# Patient Record
Sex: Female | Born: 1955 | Race: White | Hispanic: No | Marital: Married | Smoking: Never smoker
Health system: Southern US, Community
[De-identification: ages and names within clinical notes are randomized; demographics above are authoritative.]

## PROBLEM LIST (undated history)

## (undated) DIAGNOSIS — R0600 Dyspnea, unspecified: Secondary | ICD-10-CM

## (undated) DIAGNOSIS — K7460 Unspecified cirrhosis of liver: Secondary | ICD-10-CM

## (undated) DIAGNOSIS — G8929 Other chronic pain: Secondary | ICD-10-CM

## (undated) DIAGNOSIS — E119 Type 2 diabetes mellitus without complications: Secondary | ICD-10-CM

## (undated) DIAGNOSIS — I2510 Atherosclerotic heart disease of native coronary artery without angina pectoris: Secondary | ICD-10-CM

## (undated) DIAGNOSIS — F32A Depression, unspecified: Secondary | ICD-10-CM

## (undated) DIAGNOSIS — G5710 Meralgia paresthetica, unspecified lower limb: Secondary | ICD-10-CM

## (undated) DIAGNOSIS — R748 Abnormal levels of other serum enzymes: Secondary | ICD-10-CM

## (undated) DIAGNOSIS — Z8719 Personal history of other diseases of the digestive system: Secondary | ICD-10-CM

## (undated) DIAGNOSIS — Z9889 Other specified postprocedural states: Secondary | ICD-10-CM

## (undated) DIAGNOSIS — H9319 Tinnitus, unspecified ear: Secondary | ICD-10-CM

## (undated) DIAGNOSIS — I10 Essential (primary) hypertension: Secondary | ICD-10-CM

## (undated) DIAGNOSIS — F419 Anxiety disorder, unspecified: Secondary | ICD-10-CM

## (undated) DIAGNOSIS — R0609 Other forms of dyspnea: Secondary | ICD-10-CM

## (undated) DIAGNOSIS — G629 Polyneuropathy, unspecified: Secondary | ICD-10-CM

## (undated) DIAGNOSIS — Z794 Long term (current) use of insulin: Secondary | ICD-10-CM

## (undated) DIAGNOSIS — M1990 Unspecified osteoarthritis, unspecified site: Secondary | ICD-10-CM

## (undated) DIAGNOSIS — G4733 Obstructive sleep apnea (adult) (pediatric): Secondary | ICD-10-CM

## (undated) DIAGNOSIS — I209 Angina pectoris, unspecified: Secondary | ICD-10-CM

## (undated) DIAGNOSIS — R112 Nausea with vomiting, unspecified: Secondary | ICD-10-CM

## (undated) DIAGNOSIS — G4730 Sleep apnea, unspecified: Secondary | ICD-10-CM

## (undated) DIAGNOSIS — R05 Cough: Secondary | ICD-10-CM

## (undated) DIAGNOSIS — I447 Left bundle-branch block, unspecified: Secondary | ICD-10-CM

## (undated) DIAGNOSIS — G2581 Restless legs syndrome: Secondary | ICD-10-CM

## (undated) DIAGNOSIS — K219 Gastro-esophageal reflux disease without esophagitis: Secondary | ICD-10-CM

## (undated) DIAGNOSIS — D649 Anemia, unspecified: Secondary | ICD-10-CM

## (undated) DIAGNOSIS — D500 Iron deficiency anemia secondary to blood loss (chronic): Secondary | ICD-10-CM

## (undated) DIAGNOSIS — M48061 Spinal stenosis, lumbar region without neurogenic claudication: Secondary | ICD-10-CM

## (undated) DIAGNOSIS — G4700 Insomnia, unspecified: Secondary | ICD-10-CM

## (undated) DIAGNOSIS — M545 Low back pain: Secondary | ICD-10-CM

## (undated) DIAGNOSIS — J984 Other disorders of lung: Secondary | ICD-10-CM

## (undated) DIAGNOSIS — E785 Hyperlipidemia, unspecified: Secondary | ICD-10-CM

## (undated) DIAGNOSIS — F329 Major depressive disorder, single episode, unspecified: Secondary | ICD-10-CM

## (undated) DIAGNOSIS — T8859XA Other complications of anesthesia, initial encounter: Secondary | ICD-10-CM

## (undated) DIAGNOSIS — M7071 Other bursitis of hip, right hip: Secondary | ICD-10-CM

## (undated) DIAGNOSIS — F3340 Major depressive disorder, recurrent, in remission, unspecified: Secondary | ICD-10-CM

## (undated) DIAGNOSIS — Z9289 Personal history of other medical treatment: Secondary | ICD-10-CM

## (undated) DIAGNOSIS — E7800 Pure hypercholesterolemia, unspecified: Secondary | ICD-10-CM

## (undated) DIAGNOSIS — Z87442 Personal history of urinary calculi: Secondary | ICD-10-CM

## (undated) DIAGNOSIS — M7631 Iliotibial band syndrome, right leg: Secondary | ICD-10-CM

## (undated) DIAGNOSIS — M7061 Trochanteric bursitis, right hip: Secondary | ICD-10-CM

## (undated) DIAGNOSIS — R011 Cardiac murmur, unspecified: Secondary | ICD-10-CM

## (undated) DIAGNOSIS — N183 Chronic kidney disease, stage 3 unspecified: Secondary | ICD-10-CM

## (undated) DIAGNOSIS — Z981 Arthrodesis status: Secondary | ICD-10-CM

## (undated) DIAGNOSIS — N189 Chronic kidney disease, unspecified: Secondary | ICD-10-CM

## (undated) DIAGNOSIS — M109 Gout, unspecified: Secondary | ICD-10-CM

## (undated) DIAGNOSIS — T4145XA Adverse effect of unspecified anesthetic, initial encounter: Secondary | ICD-10-CM

## (undated) HISTORY — DX: Other chronic pain: G89.29

## (undated) HISTORY — DX: Essential (primary) hypertension: I10

## (undated) HISTORY — PX: CARPAL TUNNEL RELEASE: SHX101

## (undated) HISTORY — DX: Other disorders of lung: J98.4

## (undated) HISTORY — DX: Angina pectoris, unspecified: I20.9

## (undated) HISTORY — DX: Tinnitus, unspecified ear: H93.19

## (undated) HISTORY — DX: Gout, unspecified: M10.9

## (undated) HISTORY — DX: Pure hypercholesterolemia, unspecified: E78.00

## (undated) HISTORY — DX: Left bundle-branch block, unspecified: I44.7

## (undated) HISTORY — DX: Obstructive sleep apnea (adult) (pediatric): G47.33

## (undated) HISTORY — PX: EYE SURGERY: SHX253

## (undated) HISTORY — DX: Other specified postprocedural states: Z98.890

## (undated) HISTORY — DX: Cough: R05

## (undated) HISTORY — DX: Meralgia paresthetica, unspecified lower limb: G57.10

## (undated) HISTORY — DX: Insomnia, unspecified: G47.00

## (undated) HISTORY — DX: Restless legs syndrome: G25.81

## (undated) HISTORY — DX: Atherosclerotic heart disease of native coronary artery without angina pectoris: I25.10

## (undated) HISTORY — PX: BACK SURGERY: SHX140

## (undated) HISTORY — DX: Major depressive disorder, recurrent, in remission, unspecified: F33.40

## (undated) HISTORY — DX: Hyperlipidemia, unspecified: E78.5

## (undated) HISTORY — DX: Trochanteric bursitis, right hip: M70.61

## (undated) HISTORY — PX: CARDIAC CATHETERIZATION: SHX172

## (undated) HISTORY — PX: CHOLECYSTECTOMY: SHX55

## (undated) HISTORY — DX: Iliotibial band syndrome, right leg: M76.31

## (undated) HISTORY — DX: Arthrodesis status: Z98.1

## (undated) HISTORY — DX: Low back pain: M54.5

## (undated) HISTORY — DX: Type 2 diabetes mellitus without complications: E11.9

## (undated) HISTORY — DX: Abnormal levels of other serum enzymes: R74.8

## (undated) HISTORY — DX: Iron deficiency anemia secondary to blood loss (chronic): D50.0

## (undated) HISTORY — PX: APPENDECTOMY: SHX54

## (undated) HISTORY — DX: Other forms of dyspnea: R06.09

## (undated) HISTORY — DX: Long term (current) use of insulin: Z79.4

---

## 1981-10-09 HISTORY — PX: ABDOMINAL HYSTERECTOMY: SHX81

## 1994-10-09 HISTORY — PX: OTHER SURGICAL HISTORY: SHX169

## 2004-09-23 ENCOUNTER — Ambulatory Visit: Payer: Self-pay | Admitting: Family Medicine

## 2004-09-30 ENCOUNTER — Ambulatory Visit: Payer: Self-pay | Admitting: Family Medicine

## 2004-11-22 ENCOUNTER — Ambulatory Visit: Payer: Self-pay | Admitting: Family Medicine

## 2005-04-25 ENCOUNTER — Ambulatory Visit: Payer: Self-pay | Admitting: Family Medicine

## 2005-05-03 ENCOUNTER — Ambulatory Visit: Payer: Self-pay | Admitting: Family Medicine

## 2005-10-18 ENCOUNTER — Ambulatory Visit: Payer: Self-pay | Admitting: Family Medicine

## 2005-11-20 ENCOUNTER — Ambulatory Visit: Payer: Self-pay | Admitting: Family Medicine

## 2006-01-01 ENCOUNTER — Ambulatory Visit: Payer: Self-pay | Admitting: Family Medicine

## 2006-10-09 HISTORY — PX: KIDNEY STONE SURGERY: SHX686

## 2008-10-09 HISTORY — PX: OTHER SURGICAL HISTORY: SHX169

## 2009-08-20 ENCOUNTER — Inpatient Hospital Stay (HOSPITAL_BASED_OUTPATIENT_CLINIC_OR_DEPARTMENT_OTHER): Admission: RE | Admit: 2009-08-20 | Discharge: 2009-08-20 | Payer: Self-pay | Admitting: Cardiology

## 2010-07-19 ENCOUNTER — Encounter: Admission: RE | Admit: 2010-07-19 | Discharge: 2010-07-19 | Payer: Self-pay | Admitting: Neurological Surgery

## 2010-08-31 ENCOUNTER — Inpatient Hospital Stay (HOSPITAL_COMMUNITY): Admission: RE | Admit: 2010-08-31 | Discharge: 2010-09-02 | Payer: Self-pay | Admitting: Neurological Surgery

## 2010-10-12 ENCOUNTER — Encounter
Admission: RE | Admit: 2010-10-12 | Discharge: 2010-10-12 | Payer: Self-pay | Source: Home / Self Care | Attending: Neurological Surgery | Admitting: Neurological Surgery

## 2010-10-25 ENCOUNTER — Encounter
Admission: RE | Admit: 2010-10-25 | Discharge: 2010-10-25 | Payer: Self-pay | Source: Home / Self Care | Attending: Neurological Surgery | Admitting: Neurological Surgery

## 2010-11-18 ENCOUNTER — Other Ambulatory Visit: Payer: Self-pay | Admitting: Neurological Surgery

## 2010-11-18 DIAGNOSIS — M545 Low back pain: Secondary | ICD-10-CM

## 2010-11-21 ENCOUNTER — Ambulatory Visit
Admission: RE | Admit: 2010-11-21 | Discharge: 2010-11-21 | Disposition: A | Discharge status: Home / Self Care | Payer: BC Managed Care – PPO | Source: Ambulatory Visit | Attending: Neurological Surgery | Admitting: Neurological Surgery

## 2010-11-21 DIAGNOSIS — M545 Low back pain: Secondary | ICD-10-CM

## 2010-12-20 LAB — COMPREHENSIVE METABOLIC PANEL
ALT: 46 U/L — ABNORMAL HIGH (ref 0–35)
Alkaline Phosphatase: 58 U/L (ref 39–117)
BUN: 15 mg/dL (ref 6–23)
CO2: 24 mEq/L (ref 19–32)
Calcium: 9.5 mg/dL (ref 8.4–10.5)
GFR calc non Af Amer: >60 mL/min (ref >60)
Glucose, Bld: 126 mg/dL — ABNORMAL HIGH (ref 70–99)
Potassium: 4.7 mEq/L (ref 3.5–5.1)
Sodium: 135 mEq/L (ref 135–145)

## 2010-12-20 LAB — URINALYSIS, ROUTINE W REFLEX MICROSCOPIC
Bilirubin Urine: NEGATIVE
Glucose, UA: NEGATIVE mg/dL
Hgb urine dipstick: NEGATIVE
Ketones, ur: NEGATIVE mg/dL
pH: 5 (ref 5.0–8.0)

## 2010-12-20 LAB — ABO/RH: ABO/RH(D): A POS

## 2010-12-20 LAB — DIFFERENTIAL
Basophils Relative: 1 % (ref 0–1)
Eosinophils Absolute: 0.2 10*3/uL (ref 0.0–0.7)
Neutro Abs: 5.2 10*3/uL (ref 1.7–7.7)
Neutrophils Relative %: 69 % (ref 43–77)

## 2010-12-20 LAB — CBC
HCT: 35.7 % — ABNORMAL LOW (ref 36.0–46.0)
Hemoglobin: 11.6 g/dL — ABNORMAL LOW (ref 12.0–15.0)
MCHC: 32.5 g/dL (ref 30.0–36.0)
MCV: 97.3 fL (ref 78.0–100.0)

## 2010-12-20 LAB — GLUCOSE, CAPILLARY
Glucose-Capillary: 132 mg/dL — ABNORMAL HIGH (ref 70–99)
Glucose-Capillary: 219 mg/dL — ABNORMAL HIGH (ref 70–99)
Glucose-Capillary: 229 mg/dL — ABNORMAL HIGH (ref 70–99)
Glucose-Capillary: 292 mg/dL — ABNORMAL HIGH (ref 70–99)

## 2010-12-20 LAB — PROTIME-INR
INR: 0.87 (ref 0.00–1.49)
Prothrombin Time: 12 seconds (ref 11.6–15.2)

## 2010-12-20 LAB — TYPE AND SCREEN

## 2011-01-09 ENCOUNTER — Other Ambulatory Visit: Payer: Self-pay | Admitting: Neurological Surgery

## 2011-01-09 ENCOUNTER — Ambulatory Visit
Admission: RE | Admit: 2011-01-09 | Discharge: 2011-01-09 | Disposition: A | Discharge status: Home / Self Care | Payer: BC Managed Care – PPO | Source: Ambulatory Visit | Attending: Neurological Surgery | Admitting: Neurological Surgery

## 2011-01-09 DIAGNOSIS — M545 Low back pain: Secondary | ICD-10-CM

## 2011-01-09 DIAGNOSIS — M5137 Other intervertebral disc degeneration, lumbosacral region: Secondary | ICD-10-CM

## 2011-01-11 LAB — POCT I-STAT GLUCOSE: Glucose, Bld: 138 mg/dL — ABNORMAL HIGH (ref 70–99)

## 2011-05-23 ENCOUNTER — Other Ambulatory Visit: Payer: Self-pay | Admitting: Neurological Surgery

## 2011-05-23 ENCOUNTER — Ambulatory Visit
Admission: RE | Admit: 2011-05-23 | Discharge: 2011-05-23 | Disposition: A | Discharge status: Home / Self Care | Payer: BC Managed Care – PPO | Source: Ambulatory Visit | Attending: Neurological Surgery | Admitting: Neurological Surgery

## 2011-05-23 DIAGNOSIS — M5137 Other intervertebral disc degeneration, lumbosacral region: Secondary | ICD-10-CM

## 2011-05-23 DIAGNOSIS — M545 Low back pain: Secondary | ICD-10-CM

## 2011-05-23 DIAGNOSIS — M5136 Other intervertebral disc degeneration, lumbar region: Secondary | ICD-10-CM

## 2011-05-29 ENCOUNTER — Ambulatory Visit
Admission: RE | Admit: 2011-05-29 | Discharge: 2011-05-29 | Disposition: A | Discharge status: Home / Self Care | Payer: BC Managed Care – PPO | Source: Ambulatory Visit | Attending: Neurological Surgery | Admitting: Neurological Surgery

## 2011-05-29 DIAGNOSIS — M545 Low back pain: Secondary | ICD-10-CM

## 2011-05-29 DIAGNOSIS — M5136 Other intervertebral disc degeneration, lumbar region: Secondary | ICD-10-CM

## 2011-05-29 MED ORDER — DIAZEPAM 2 MG PO TABS
10.0000 mg | ORAL_TABLET | Freq: Once | ORAL | Status: AC
  Administered 2011-05-29: 10 mg via ORAL

## 2011-05-29 MED ORDER — HYDROCODONE-ACETAMINOPHEN 5-325 MG PO TABS
2.0000 | ORAL_TABLET | Freq: Once | ORAL | Status: AC
  Administered 2011-05-29: 2 via ORAL

## 2011-05-29 MED ORDER — IOHEXOL 180 MG/ML  SOLN
18.0000 mL | Freq: Once | INTRAMUSCULAR | Status: AC | PRN
  Administered 2011-05-29: 18 mL via INTRATHECAL

## 2011-06-20 ENCOUNTER — Encounter (HOSPITAL_COMMUNITY)
Admission: RE | Admit: 2011-06-20 | Discharge: 2011-06-20 | Disposition: A | Discharge status: Home / Self Care | Payer: BC Managed Care – PPO | Source: Ambulatory Visit | Attending: Neurological Surgery | Admitting: Neurological Surgery

## 2011-06-20 LAB — DIFFERENTIAL
Lymphocytes Relative: 24 % (ref 12–46)
Lymphs Abs: 1.9 10*3/uL (ref 0.7–4.0)
Monocytes Relative: 6 % (ref 3–12)
Neutro Abs: 5.4 10*3/uL (ref 1.7–7.7)
Neutrophils Relative %: 66 % (ref 43–77)

## 2011-06-20 LAB — CBC
Hemoglobin: 11.9 g/dL — ABNORMAL LOW (ref 12.0–15.0)
MCH: 32.1 pg (ref 26.0–34.0)
MCV: 94.9 fL (ref 78.0–100.0)
RBC: 3.71 MIL/uL — ABNORMAL LOW (ref 3.87–5.11)
WBC: 8.2 10*3/uL (ref 4.0–10.5)

## 2011-06-20 LAB — TYPE AND SCREEN: ABO/RH(D): A POS

## 2011-06-20 LAB — BASIC METABOLIC PANEL
CO2: 27 mEq/L (ref 19–32)
Calcium: 9.9 mg/dL (ref 8.4–10.5)
GFR calc non Af Amer: 56 mL/min — ABNORMAL LOW (ref >60)
Glucose, Bld: 193 mg/dL — ABNORMAL HIGH (ref 70–99)
Potassium: 5.2 mEq/L — ABNORMAL HIGH (ref 3.5–5.1)
Sodium: 141 mEq/L (ref 135–145)

## 2011-06-20 LAB — APTT: aPTT: 29 seconds (ref 24–37)

## 2011-06-20 LAB — SURGICAL PCR SCREEN
MRSA, PCR: NEGATIVE
Staphylococcus aureus: POSITIVE — AB

## 2011-06-28 ENCOUNTER — Inpatient Hospital Stay (HOSPITAL_COMMUNITY)
Admission: RE | Admit: 2011-06-28 | Discharge: 2011-07-02 | DRG: 756 | Disposition: A | Discharge status: Home / Self Care | Payer: BC Managed Care – PPO | Source: Ambulatory Visit | Attending: Neurological Surgery | Admitting: Neurological Surgery

## 2011-06-28 ENCOUNTER — Inpatient Hospital Stay (HOSPITAL_COMMUNITY): Payer: BC Managed Care – PPO

## 2011-06-28 DIAGNOSIS — K219 Gastro-esophageal reflux disease without esophagitis: Secondary | ICD-10-CM | POA: Diagnosis present

## 2011-06-28 DIAGNOSIS — I10 Essential (primary) hypertension: Secondary | ICD-10-CM | POA: Diagnosis present

## 2011-06-28 DIAGNOSIS — E119 Type 2 diabetes mellitus without complications: Secondary | ICD-10-CM | POA: Diagnosis present

## 2011-06-28 DIAGNOSIS — IMO0002 Reserved for concepts with insufficient information to code with codable children: Principal | ICD-10-CM | POA: Diagnosis present

## 2011-06-28 LAB — GLUCOSE, CAPILLARY
Glucose-Capillary: 189 mg/dL — ABNORMAL HIGH (ref 70–99)
Glucose-Capillary: 281 mg/dL — ABNORMAL HIGH (ref 70–99)

## 2011-06-29 LAB — GLUCOSE, CAPILLARY
Glucose-Capillary: 100 mg/dL — ABNORMAL HIGH (ref 70–99)
Glucose-Capillary: 186 mg/dL — ABNORMAL HIGH (ref 70–99)
Glucose-Capillary: 225 mg/dL — ABNORMAL HIGH (ref 70–99)

## 2011-06-30 LAB — GLUCOSE, CAPILLARY
Glucose-Capillary: 195 mg/dL — ABNORMAL HIGH (ref 70–99)
Glucose-Capillary: 165 mg/dL — ABNORMAL HIGH (ref 70–99)

## 2011-07-01 LAB — GLUCOSE, CAPILLARY: Glucose-Capillary: 173 mg/dL — ABNORMAL HIGH (ref 70–99)

## 2011-07-02 LAB — GLUCOSE, CAPILLARY
Glucose-Capillary: 189 mg/dL — ABNORMAL HIGH (ref 70–99)
Glucose-Capillary: 154 mg/dL — ABNORMAL HIGH (ref 70–99)
Glucose-Capillary: 154 mg/dL — ABNORMAL HIGH (ref 70–99)

## 2011-07-03 NOTE — Op Note (Signed)
[name], [name]                   ACCOUNT NO.:  [id number]  MEDICAL RECORD NO.:  [id number]  LOCATION:  3002                         FACILITY:  [facility]  PHYSICIAN:  Tia Alert, MD     DATE OF BIRTH:  08/15/1956  DATE OF PROCEDURE:  06/28/2011 DATE OF DISCHARGE:                              OPERATIVE REPORT   PREOPERATIVE DIAGNOSIS:  Pseudoarthrosis L3-L4, L4-L5 with back and leg pain.  POSTOPERATIVE DIAGNOSIS:  Pseudoarthrosis L3-L4, L4-L5 with back and leg pain.  PROCEDURES: 1. Intertransverse arthrodesis bilaterally utilizing morselized     allograft. 2. Segmental fixation L3 through L5 utilizing the [facility] Pedicle     Screw System.  SURGEON:  Tia Alert, MD  ASSISTANT:  Donalee Citrin, MD  ANESTHESIA:  General endotracheal.  COMPLICATIONS:  None apparent.  INDICATIONS FOR PROCEDURE:  [name] is a 55 year old female who underwent a two-level XLIF a year ago.  This was a stand-alone procedure.  She developed symptomatic pseudoarthrosis over time.  I recommended a posterior fusion with instrumentation.  She understood the risks, benefits, and expected outcome and wished to proceed.  DESCRIPTION OF PROCEDURE:  The patient was taken to the operating room and after induction of adequate generalized endotracheal anesthesia, she was rolled into the prone position on the Wilson frame and all pressure points were padded.  Her lumbar region was prepped with DuraPrep and then draped in usual sterile fashion.  10 mL of the local anesthesia was injected and a dorsal midline incision was made and carried down to the lumbosacral fascia.  The fascia was opened and the paraspinous musculature was taken down in a subperiosteal fashion to expose L3 through L5.  Intraoperative fluoroscopy confirmed my levels and then I took the dissection out over the facets to expose the L3, L4, and L5 transverse processes.  I confirmed my levels once again.  I then localized the pedicle screw  entry zones utilizing surface landmarks in AP and lateral fluoroscopy.  We probed each pedicle with a pedicle probe, tapped each pedicle with a 5 x 5 tap and then placed 65 x 45-mm pedicle screws into the L3, L4, and L5 pedicles bilaterally.  We used AP and lateral fluoroscopy to perform the screw placement.  We then decorticated the transverse processes bilaterally and placed morselized allograft out over these in the form of EquivaBone and Actifuse putty. We then placed lordotic rods into the multiaxial screw heads of the pedicle screws and locked these into position with the locking caps and an antitorque device.  We then checked our construct with AP and lateral fluoroscopy, it looked quite good.  We placed a medium Hemovac drain through a separate stab incision, irrigated, and then closed the fascia with 0 Vicryl, closed the subcutaneous and subcuticular tissue with 2-0 and 3-0 Vicryl, and closed the skin with Benzoin and Steri-Strips.  The drapes were removed, sterile dressing was applied.  The patient was awakened from general anesthesia and transferred to recovery room in stable condition. At the end of the procedure, all sponge, needle, and instrument counts were correct.     Tia Alert, MD     DSJ/MEDQ  D:  06/28/2011  T:  06/28/2011  Job:  [id number]  Electronically Signed by Marikay Alar MD on 07/03/2011 01:13:56 PM

## 2011-07-24 ENCOUNTER — Ambulatory Visit
Admission: RE | Admit: 2011-07-24 | Discharge: 2011-07-24 | Disposition: A | Discharge status: Home / Self Care | Payer: BC Managed Care – PPO | Source: Ambulatory Visit | Attending: Neurological Surgery | Admitting: Neurological Surgery

## 2011-07-24 ENCOUNTER — Other Ambulatory Visit: Payer: Self-pay | Admitting: Neurological Surgery

## 2011-07-24 DIAGNOSIS — M545 Low back pain: Secondary | ICD-10-CM

## 2011-07-28 NOTE — Discharge Summary (Signed)
  NAMECHRISTINEA, BRIZUELA                   ACCOUNT NO.:  [id number]  MEDICAL RECORD NO.:  [id number]  LOCATION:  3002                         FACILITY:  [facility]  PHYSICIAN:  Tia Alert, MD     DATE OF BIRTH:  July 01, 1956  DATE OF ADMISSION:  06/28/2011 DATE OF DISCHARGE:  07/02/2011                              DISCHARGE SUMMARY   ADMITTING DIAGNOSIS:  Pseudoarthrosis.  PROCEDURE:  Posterior lumbar fusion with segmental instrumentation, L3- L5.  HISTORY OF PRESENT ILLNESS:  Ms. Ordoyne had a lumbar fusion about a year ago and had continued back and leg pain.  She had plain films in flexion- extension views and MRI and CT scan which all also suggested pseudoarthrosis at both levels.  I recommended two-level posterior lumbar fusion with instrumentation.  She understood the risks, benefits, expected outcome and wished to proceed.  HOSPITAL COURSE:  The patient was admitted on June 28, 2011 and taken to the operating room where she underwent two-level fusion from L3- L5.  The patient tolerated the procedure well and went to recovery room and then to the floor in stable condition.  For details of the operative procedure, please see the dictated operative note.  The patient's hospital course was routine.  There were no complications.  She had good strength after surgery.  Her incision remained clean, dry, and intact. She had a Hemovac drain in place for 2-3 days after the surgery.  She had good strength in her lower extremities.  Her leg pain was gone.  She had appropriate postop back soreness.  Her diet was advanced.  She remained afebrile with stable vital signs.  She tolerated activities with physical and occupational therapy and was discharged to home in stable condition on postop day 4 with plans to follow up in 2 weeks.  FINAL DIAGNOSIS:  L3-L5 posterior fusion with segmental instrumentation.     Tia Alert, MD     DSJ/MEDQ  D:  07/18/2011  T:  07/18/2011  Job:   [id number]  Electronically Signed by Marikay Alar MD on 07/28/2011 10:50:23 AM

## 2011-08-22 ENCOUNTER — Other Ambulatory Visit: Payer: Self-pay | Admitting: Neurological Surgery

## 2011-08-22 ENCOUNTER — Ambulatory Visit
Admission: RE | Admit: 2011-08-22 | Discharge: 2011-08-22 | Disposition: A | Discharge status: Home / Self Care | Payer: BC Managed Care – PPO | Source: Ambulatory Visit | Attending: Neurological Surgery | Admitting: Neurological Surgery

## 2011-08-22 DIAGNOSIS — M545 Low back pain: Secondary | ICD-10-CM

## 2011-10-31 ENCOUNTER — Other Ambulatory Visit: Payer: Self-pay | Admitting: Neurological Surgery

## 2011-10-31 ENCOUNTER — Ambulatory Visit
Admission: RE | Admit: 2011-10-31 | Discharge: 2011-10-31 | Disposition: A | Discharge status: Home / Self Care | Payer: BC Managed Care – PPO | Source: Ambulatory Visit | Attending: Neurological Surgery | Admitting: Neurological Surgery

## 2011-10-31 DIAGNOSIS — M545 Low back pain: Secondary | ICD-10-CM

## 2011-10-31 DIAGNOSIS — M961 Postlaminectomy syndrome, not elsewhere classified: Secondary | ICD-10-CM

## 2012-01-04 ENCOUNTER — Encounter: Payer: Self-pay | Admitting: *Deleted

## 2012-01-30 ENCOUNTER — Other Ambulatory Visit: Payer: Self-pay | Admitting: Surgical

## 2012-01-30 ENCOUNTER — Encounter (HOSPITAL_COMMUNITY): Payer: Self-pay | Admitting: Pharmacy Technician

## 2012-01-30 MED ORDER — BUPIVACAINE LIPOSOME 1.3 % IJ SUSP: 20.0000 mL | Freq: Once | INTRAMUSCULAR | Status: DC

## 2012-01-31 ENCOUNTER — Ambulatory Visit (HOSPITAL_COMMUNITY)
Admission: RE | Admit: 2012-01-31 | Discharge: 2012-01-31 | Disposition: A | Discharge status: Home / Self Care | Payer: BC Managed Care – PPO | Source: Ambulatory Visit | Attending: Orthopedic Surgery | Admitting: Orthopedic Surgery

## 2012-01-31 ENCOUNTER — Encounter (HOSPITAL_COMMUNITY): Payer: Self-pay

## 2012-01-31 ENCOUNTER — Encounter (HOSPITAL_COMMUNITY)
Admission: RE | Admit: 2012-01-31 | Discharge: 2012-01-31 | Disposition: A | Discharge status: Home / Self Care | Payer: BC Managed Care – PPO | Source: Ambulatory Visit | Attending: Orthopedic Surgery | Admitting: Orthopedic Surgery

## 2012-01-31 DIAGNOSIS — M76899 Other specified enthesopathies of unspecified lower limb, excluding foot: Secondary | ICD-10-CM | POA: Insufficient documentation

## 2012-01-31 DIAGNOSIS — Z01812 Encounter for preprocedural laboratory examination: Secondary | ICD-10-CM | POA: Insufficient documentation

## 2012-01-31 DIAGNOSIS — Z01818 Encounter for other preprocedural examination: Secondary | ICD-10-CM | POA: Insufficient documentation

## 2012-01-31 HISTORY — DX: Major depressive disorder, single episode, unspecified: F32.9

## 2012-01-31 HISTORY — DX: Personal history of other diseases of the digestive system: Z87.19

## 2012-01-31 HISTORY — DX: Other bursitis of hip, right hip: M70.71

## 2012-01-31 HISTORY — DX: Restless legs syndrome: G25.81

## 2012-01-31 HISTORY — DX: Sleep apnea, unspecified: G47.30

## 2012-01-31 HISTORY — DX: Depression, unspecified: F32.A

## 2012-01-31 HISTORY — DX: Other specified postprocedural states: Z98.890

## 2012-01-31 HISTORY — DX: Gastro-esophageal reflux disease without esophagitis: K21.9

## 2012-01-31 HISTORY — DX: Spinal stenosis, lumbar region without neurogenic claudication: M48.061

## 2012-01-31 HISTORY — DX: Nausea with vomiting, unspecified: R11.2

## 2012-01-31 LAB — URINALYSIS, ROUTINE W REFLEX MICROSCOPIC
Bilirubin Urine: NEGATIVE
Glucose, UA: 250 mg/dL — AB
Hgb urine dipstick: NEGATIVE
Ketones, ur: NEGATIVE mg/dL
Nitrite: NEGATIVE
Protein, ur: NEGATIVE mg/dL
Specific Gravity, Urine: 1.023 (ref 1.005–1.030)
Urobilinogen, UA: 0.2 mg/dL (ref 0.0–1.0)
pH: 6 (ref 5.0–8.0)

## 2012-01-31 LAB — COMPREHENSIVE METABOLIC PANEL
ALT: 56 U/L — ABNORMAL HIGH (ref 0–35)
AST: 52 U/L — ABNORMAL HIGH (ref 0–37)
Albumin: 3.9 g/dL (ref 3.5–5.2)
Alkaline Phosphatase: 75 U/L (ref 39–117)
BUN: 19 mg/dL (ref 6–23)
CO2: 29 mEq/L (ref 19–32)
Calcium: 9.7 mg/dL (ref 8.4–10.5)
Chloride: 102 mEq/L (ref 96–112)
Creatinine, Ser: 0.8 mg/dL (ref 0.50–1.10)
GFR calc Af Amer: >90 mL/min (ref >90)
GFR calc non Af Amer: 82 mL/min — ABNORMAL LOW (ref >90)
Glucose, Bld: 176 mg/dL — ABNORMAL HIGH (ref 70–99)
Potassium: 5.2 mEq/L — ABNORMAL HIGH (ref 3.5–5.1)
Sodium: 137 mEq/L (ref 135–145)
Total Bilirubin: 0.2 mg/dL — ABNORMAL LOW (ref 0.3–1.2)
Total Protein: 7.3 g/dL (ref 6.0–8.3)

## 2012-01-31 LAB — URINE MICROSCOPIC-ADD ON

## 2012-01-31 LAB — DIFFERENTIAL
Basophils Absolute: 0.1 10*3/uL (ref 0.0–0.1)
Basophils Relative: 1 % (ref 0–1)
Eosinophils Absolute: 0.3 10*3/uL (ref 0.0–0.7)
Eosinophils Relative: 4 % (ref 0–5)
Lymphocytes Relative: 24 % (ref 12–46)
Lymphs Abs: 1.8 10*3/uL (ref 0.7–4.0)
Monocytes Absolute: 0.5 10*3/uL (ref 0.1–1.0)
Monocytes Relative: 6 % (ref 3–12)
Neutro Abs: 4.9 10*3/uL (ref 1.7–7.7)
Neutrophils Relative %: 65 % (ref 43–77)

## 2012-01-31 LAB — CBC
HCT: 37.3 % (ref 36.0–46.0)
Hemoglobin: 12.4 g/dL (ref 12.0–15.0)
MCH: 31 pg (ref 26.0–34.0)
MCHC: 33.2 g/dL (ref 30.0–36.0)
MCV: 93.3 fL (ref 78.0–100.0)
Platelets: 326 10*3/uL (ref 150–400)
RBC: 4 MIL/uL (ref 3.87–5.11)
RDW: 12.3 % (ref 11.5–15.5)
WBC: 7.6 10*3/uL (ref 4.0–10.5)

## 2012-01-31 LAB — PROTIME-INR
INR: 0.85 (ref 0.00–1.49)
Prothrombin Time: 11.8 seconds (ref 11.6–15.2)

## 2012-01-31 LAB — APTT: aPTT: 32 seconds (ref 24–37)

## 2012-01-31 LAB — SURGICAL PCR SCREEN: MRSA, PCR: NEGATIVE

## 2012-01-31 NOTE — Pre-Procedure Instructions (Signed)
PT'S EKG REPORT ON PT'S CHART FROM [facility] - DONE 12/04/11.   PT'S CARDIAC MONITOR STUDY REPORT 12/07/11 TO 01/06/12 ON CHART FROM [facility]--PT STATES SHE WAS TOLD REPORT SHOWED TACHYCARDIA-NO OTHER ARRHTHYMIAS.   PT'S CARDIAC CATH REPORT [facility] 08/20/09 ON PT'S CHART.   SLEEP STUDY REPORT 12/28/10 ON CHART FROM [facility]. CBC WITH DIFF, CMET, PT, PTT, UA, T/S AND CXR WERE DONE TODAY AT [facility] PREOP.

## 2012-01-31 NOTE — Patient Instructions (Signed)
YOUR SURGERY IS SCHEDULED ON:  Friday  4/26  AT 2:30 PM  REPORT TO [facility] SHORT STAY CENTER AT:  12:30 PM      PHONE # FOR SHORT STAY IS [phone]  DO NOT EAT ANYTHING AFTER MIDNIGHT THE NIGHT BEFORE YOUR SURGERY.   NO FOOD, NO CHEWING GUM, NO MINTS, NO CANDIES, NO CHEWING TOBACCO.  YOU MANY HAVE CLEAR LIQUIDS TO DRINK FROM MIDNIGHT THE NIGHT BEFORE YOUR SURGERY UNTIL 8:30 AM--LIKE WATER OR SODA OR APPLE JUICE.  NOTHING TO DRINK AFTER 8:30 AM DAY OF SURGERY.  PLEASE TAKE THE FOLLOWING MEDICATIONS THE AM OF YOUR SURGERY WITH A FEW SIPS OF WATER:  CYMBALTA AND NEXIUM    IF YOU USE INHALERS--USE YOUR INHALERS THE AM OF YOUR SURGERY AND BRING INHALERS TO THE HOSPITAL -TAKE TO SURGERY.    IF YOU ARE DIABETIC:  DO NOT TAKE ANY DIABETIC MEDICATIONS THE AM OF YOUR SURGERY.  IF YOU TAKE INSULIN IN THE EVENINGS--PLEASE ONLY TAKE 1/2 NORMAL EVENING DOSE THE NIGHT BEFORE YOUR SURGERY.  NO INSULIN THE AM OF YOUR SURGERY.  IF YOU HAVE SLEEP APNEA AND USE CPAP OR BIPAP--PLEASE BRING THE MASK --NOT THE MACHINE-NOT THE TUBING   -JUST THE MASK. DO NOT BRING VALUABLES, MONEY, CREDIT CARDS.  CONTACT LENS, DENTURES / PARTIALS, GLASSES SHOULD NOT BE WORN TO SURGERY AND IN MOST CASES-HEARING AIDS WILL NEED TO BE REMOVED.  BRING YOUR GLASSES CASE, ANY EQUIPMENT NEEDED FOR YOUR CONTACT LENS. FOR PATIENTS ADMITTED TO THE HOSPITAL--CHECK OUT TIME THE DAY OF DISCHARGE IS 11:00 AM.  ALL INPATIENT ROOMS ARE PRIVATE - WITH BATHROOM, TELEPHONE, TELEVISION AND WIFI INTERNET. IF YOU ARE BEING DISCHARGED THE SAME DAY OF YOUR SURGERY--YOU CAN NOT DRIVE YOURSELF HOME--AND SHOULD NOT GO HOME ALONE BY TAXI OR BUS.  NO DRIVING OR OPERATING MACHINERY FOR 24 HOURS FOLLOWING ANESTHESIA / PAIN MEDICATIONS.                            SPECIAL INSTRUCTIONS:  CHLORHEXIDINE SOAP SHOWER (other brand names are Betasept and Hibiclens ) PLEASE SHOWER WITH CHLORHEXIDINE THE NIGHT BEFORE YOUR SURGERY AND THE AM OF YOUR SURGERY. DO NOT  USE CHLORHEXIDINE ON YOUR FACE OR PRIVATE AREAS--YOU MAY USE YOUR NORMAL SOAP THOSE AREAS AND YOUR NORMAL SHAMPOO.  WOMEN SHOULD AVOID SHAVING UNDER ARMS AND SHAVING LEGS 48 HOURS BEFORE USING CHLORHEXIDINE TO AVOID SKIN IRRITATION.  DO NOT USE IF ALLERGIC TO CHLORHEXIDINE.  PLEASE READ OVER ANY  FACT SHEETS THAT YOU WERE GIVEN: MRSA INFORMATION, BLOOD TRANSFUSION INFORMATION, INCENTIVE SPIROMETER INFORMATION.

## 2012-02-01 NOTE — H&P (Signed)
Katelyn [name] DOB: 08/30/1956   Chief Complaint: right lateral hip pain  History of Present Illness The patient is a 56 year old female who is scheduled for a right hip bursectomy and resection of a portion of the iliotibial band. The patient reports right lateral hip problems including pain symptoms that have been present for years. The symptoms began without any known injury. Symptoms reported include hip pain and pain with weightbearing.  She states she has had injections in the past which helped some. She states her PCP advised her not to have more because of weakening of the muscle. She denies groin pain and no injury. She states she has been suffering with it for a long time. She is really ready to get on with another form of treatment for the right hip. She cannot lie on that bursa. She has had two previous back operations. She has a history of a bundle branch block. The patient has done well with her last two back surgeries with no complications. Most predictable means for decreased pain in the right lateral hip is surgery. Risks and benefits of the surgery discussed.    Past Medical History Cardiac Arrhythmia Depression Diabetes Mellitus, Type II Fibromyalgia Gastroesophageal Reflux Disease High blood pressure Hypercholesterolemia Kidney Stone Other disease, cancer, significant illness Sleep Apnea  Family History Bleeding disorder. father Cancer. father and sister Cerebrovascular Accident. father Congestive Heart Failure. mother and grandfather mothers side Depression. sister Diabetes Mellitus. mother and sister Heart Disease. mother Heart disease in female family member before age 32 Hypertension. father and sister Rheumatoid Arthritis. grandmother mothers side and grandmother fathers side   Social History Alcohol use. current drinker; drinks wine; only occasionally per week Children. 1 Current work status. disabled Drug/Alcohol Rehab  (Currently). no Drug/Alcohol Rehab (Previously). no Exercise. Exercises daily; does running / walking Illicit drug use. no Living situation. live with spouse Number of flights of stairs before winded. 4-5 Pain Contract. yes Tobacco / smoke exposure. yes Tobacco use. never smoker   Medication History IT consultant ( In Vitro) Active. Cymbalta (60MG  Capsule DR Part, Oral) Active. Cymbalta (30MG  Capsule DR Part, Oral) Active. Hydrocodone-Acetaminophen (5-500MG  Tablet, Oral) Active. Janumet (50-1000MG  Tablet, Oral) Active. Lisinopril-Hydrochlorothiazide (20-12.5MG  Tablet, Oral) Active. NexIUM (40MG  Capsule DR, Oral) Active. Zetia (10MG  Tablet, Oral) Active. Aspirin EC (81MG  Tablet DR, Oral) Active. Calcium-Magnesium (1 Oral) Active.   Pregnancy / Birth History Pregnant. no   Past Surgical History Appendectomy Arthroscopy of Knee. left Arthroscopy of Shoulder. right Carpal Tunnel Repair. bilateral Gallbladder Surgery. laporoscopic Hysterectomy. partial (non-cancerous) Rotator Cuff Repair. right Spinal Decompression. lower back Spinal Fusion. lower back Spinal Surgery  Review of Systems General:Present- Night Sweats, Fatigue and Weight Loss. Not Present- Chills, Fever, Appetite Loss, Feeling sick and Weight Gain. HEENT:Present- Ringing in the Ears. Not Present- Sensitivity to light, Hearing problems and Nose Bleed. Respiratory:Present- Dyspnea. Not Present- Snoring, Chronic Cough and Bloody sputum. Cardiovascular:Present- Shortness of Breath, Swelling of Extremities, Leg Cramps and Palpitations. Not Present- Chest Pain. Gastrointestinal:Present- Heartburn. Not Present- Bloody Stool, Abdominal Pain, Vomiting, Nausea and Incontinence of Stool. Female Genitourinary:Present- Frequency. Not Present- Blood in Urine, Menstrual Irregularities, Incontinence and Nocturia. Musculoskeletal:Present- Muscle Weakness, Muscle Pain, Joint Stiffness, Joint  Swelling, Joint Pain and Back Pain. Neurological:Present- Numbness, Burning and Headaches. Not Present- Tingling, Tremor and Dizziness. Psychiatric:Present- Anxiety, Depression and Memory Loss. Endocrine:Present- Cold Intolerance and Heat Intolerance. Not Present- Excessive hunger and Excessive Thirst. Hematology:Present- Easy Bruising. Not Present- Abnormal Bleeding, Anemia and Blood Clots.   Vitals Weight:  172 lb Height: 63 in Body Surface Area: 1.86 m Body Mass Index: 30.47 kg/m Pulse: 78 (Regular) BP: 144/66 (Sitting, Left Arm, Standard)    Physical Exam On examination, she is very tender over the greater trochanteric bursa. There are no masses and no tumors noted. No erythema. No signs of infection. The patient is really uncomfortable. Normal painless ROM in the hips and knees. No motor deficits in the LE. Heart sounds normal. RRR. Lungs clear to auscultation. Abdomen soft and nontender. Bowel sounds active. Neck supple. Oral cavity clear with no lesions present.    RADIOGRAPHS: X-rays of the hip and they are normal.   Assessment & Plan Enthesopathy, hip (726.5) Right hip bursectomy with resection of portion of iliotibial band We discussed the surgery and possible complications such as infection or blood clots in the leg which are rare. She would be in the hospital overnight. She would be under general anesthesia. We will give her IV antibiotics right before the surgery.         Dimitri Ped, PA-C

## 2012-02-02 ENCOUNTER — Encounter (HOSPITAL_COMMUNITY): Payer: Self-pay | Admitting: *Deleted

## 2012-02-02 ENCOUNTER — Encounter (HOSPITAL_COMMUNITY): Payer: Self-pay | Admitting: Anesthesiology

## 2012-02-02 ENCOUNTER — Ambulatory Visit (HOSPITAL_COMMUNITY)
Admission: RE | Admit: 2012-02-02 | Discharge: 2012-02-03 | Disposition: A | Discharge status: Home / Self Care | Payer: BC Managed Care – PPO | Source: Ambulatory Visit | Attending: Orthopedic Surgery | Admitting: Orthopedic Surgery

## 2012-02-02 ENCOUNTER — Encounter (HOSPITAL_COMMUNITY)
Admission: RE | Disposition: A | Discharge status: Home / Self Care | Payer: Self-pay | Source: Ambulatory Visit | Attending: Orthopedic Surgery

## 2012-02-02 ENCOUNTER — Ambulatory Visit (HOSPITAL_COMMUNITY): Payer: BC Managed Care – PPO | Admitting: Anesthesiology

## 2012-02-02 DIAGNOSIS — I10 Essential (primary) hypertension: Secondary | ICD-10-CM | POA: Insufficient documentation

## 2012-02-02 DIAGNOSIS — M7631 Iliotibial band syndrome, right leg: Secondary | ICD-10-CM | POA: Diagnosis present

## 2012-02-02 DIAGNOSIS — M7061 Trochanteric bursitis, right hip: Secondary | ICD-10-CM

## 2012-02-02 DIAGNOSIS — K219 Gastro-esophageal reflux disease without esophagitis: Secondary | ICD-10-CM | POA: Insufficient documentation

## 2012-02-02 DIAGNOSIS — M76899 Other specified enthesopathies of unspecified lower limb, excluding foot: Secondary | ICD-10-CM | POA: Insufficient documentation

## 2012-02-02 DIAGNOSIS — E7800 Pure hypercholesterolemia, unspecified: Secondary | ICD-10-CM | POA: Insufficient documentation

## 2012-02-02 DIAGNOSIS — M7630 Iliotibial band syndrome, unspecified leg: Secondary | ICD-10-CM | POA: Insufficient documentation

## 2012-02-02 DIAGNOSIS — M7060 Trochanteric bursitis, unspecified hip: Secondary | ICD-10-CM | POA: Insufficient documentation

## 2012-02-02 DIAGNOSIS — Z79899 Other long term (current) drug therapy: Secondary | ICD-10-CM | POA: Insufficient documentation

## 2012-02-02 DIAGNOSIS — M2420 Disorder of ligament, unspecified site: Secondary | ICD-10-CM | POA: Insufficient documentation

## 2012-02-02 DIAGNOSIS — E119 Type 2 diabetes mellitus without complications: Secondary | ICD-10-CM | POA: Insufficient documentation

## 2012-02-02 DIAGNOSIS — M629 Disorder of muscle, unspecified: Secondary | ICD-10-CM | POA: Insufficient documentation

## 2012-02-02 DIAGNOSIS — G4730 Sleep apnea, unspecified: Secondary | ICD-10-CM | POA: Insufficient documentation

## 2012-02-02 HISTORY — DX: Iliotibial band syndrome, right leg: M76.31

## 2012-02-02 HISTORY — DX: Trochanteric bursitis, right hip: M70.61

## 2012-02-02 HISTORY — PX: EXCISION/RELEASE BURSA HIP: SHX5014

## 2012-02-02 LAB — TYPE AND SCREEN
ABO/RH(D): A POS
Antibody Screen: NEGATIVE

## 2012-02-02 LAB — GLUCOSE, CAPILLARY
Glucose-Capillary: 160 mg/dL — ABNORMAL HIGH (ref 70–99)
Glucose-Capillary: 167 mg/dL — ABNORMAL HIGH (ref 70–99)
Glucose-Capillary: 223 mg/dL — ABNORMAL HIGH (ref 70–99)

## 2012-02-02 SURGERY — RELEASE, BURSA, TROCHANTERIC
Anesthesia: General | Site: Hip | Laterality: Right | Wound class: Clean

## 2012-02-02 MED ORDER — MENTHOL 3 MG MT LOZG
1.0000 | LOZENGE | OROMUCOSAL | Status: DC | PRN
  Filled 2012-02-02: qty 9

## 2012-02-02 MED ORDER — ACETAMINOPHEN 325 MG PO TABS: 650.0000 mg | ORAL_TABLET | Freq: Four times a day (QID) | ORAL | Status: DC | PRN

## 2012-02-02 MED ORDER — LISINOPRIL-HYDROCHLOROTHIAZIDE 20-12.5 MG PO TABS: 1.0000 | ORAL_TABLET | Freq: Every day | ORAL | Status: DC

## 2012-02-02 MED ORDER — THROMBIN 20000 UNITS EX KIT
PACK | CUTANEOUS | Status: DC | PRN
  Administered 2012-02-02: 15:00:00 via TOPICAL

## 2012-02-02 MED ORDER — SITAGLIPTIN PHOS-METFORMIN HCL 50-1000 MG PO TABS: 1.0000 | ORAL_TABLET | Freq: Two times a day (BID) | ORAL | Status: DC

## 2012-02-02 MED ORDER — ACETAMINOPHEN 650 MG RE SUPP: 650.0000 mg | Freq: Four times a day (QID) | RECTAL | Status: DC | PRN

## 2012-02-02 MED ORDER — PANTOPRAZOLE SODIUM 40 MG PO TBEC
40.0000 mg | DELAYED_RELEASE_TABLET | Freq: Every day | ORAL | Status: DC
  Administered 2012-02-02 – 2012-02-03 (×2): 40 mg via ORAL
  Filled 2012-02-02 (×2): qty 1

## 2012-02-02 MED ORDER — LIDOCAINE HCL (CARDIAC) 20 MG/ML IV SOLN
INTRAVENOUS | Status: DC | PRN
  Administered 2012-02-02: 80 mg via INTRAVENOUS

## 2012-02-02 MED ORDER — BUPIVACAINE LIPOSOME 1.3 % IJ SUSP
20.0000 mL | Freq: Once | INTRAMUSCULAR | Status: AC
  Administered 2012-02-02: 20 mL
  Filled 2012-02-02: qty 20

## 2012-02-02 MED ORDER — SUCCINYLCHOLINE CHLORIDE 20 MG/ML IJ SOLN
INTRAMUSCULAR | Status: DC | PRN
  Administered 2012-02-02: 100 mg via INTRAVENOUS

## 2012-02-02 MED ORDER — LACTATED RINGERS IV SOLN
INTRAVENOUS | Status: DC
  Administered 2012-02-02: 1000 mL via INTRAVENOUS

## 2012-02-02 MED ORDER — ALUM & MAG HYDROXIDE-SIMETH 200-200-20 MG/5ML PO SUSP: 30.0000 mL | ORAL | Status: DC | PRN

## 2012-02-02 MED ORDER — DULOXETINE HCL 60 MG PO CPEP
60.0000 mg | ORAL_CAPSULE | Freq: Every day | ORAL | Status: DC
  Administered 2012-02-03: 60 mg via ORAL
  Filled 2012-02-02 (×2): qty 1

## 2012-02-02 MED ORDER — FENTANYL CITRATE 0.05 MG/ML IJ SOLN
INTRAMUSCULAR | Status: DC | PRN
  Administered 2012-02-02 (×3): 50 ug via INTRAVENOUS

## 2012-02-02 MED ORDER — CEFAZOLIN SODIUM 1-5 GM-% IV SOLN
1.0000 g | INTRAVENOUS | Status: AC
  Administered 2012-02-02: 1 g via INTRAVENOUS

## 2012-02-02 MED ORDER — ONDANSETRON HCL 4 MG PO TABS: 4.0000 mg | ORAL_TABLET | Freq: Four times a day (QID) | ORAL | Status: DC | PRN

## 2012-02-02 MED ORDER — HYDROMORPHONE HCL PF 1 MG/ML IJ SOLN
0.2500 mg | INTRAMUSCULAR | Status: DC | PRN
  Administered 2012-02-02 (×2): 0.5 mg via INTRAVENOUS

## 2012-02-02 MED ORDER — LINAGLIPTIN 5 MG PO TABS
5.0000 mg | ORAL_TABLET | Freq: Every day | ORAL | Status: DC
  Administered 2012-02-02 – 2012-02-03 (×2): 5 mg via ORAL
  Filled 2012-02-02 (×2): qty 1

## 2012-02-02 MED ORDER — CEFAZOLIN SODIUM 1-5 GM-% IV SOLN
INTRAVENOUS | Status: AC
  Filled 2012-02-02: qty 50

## 2012-02-02 MED ORDER — THROMBIN 5000 UNITS EX SOLR
CUTANEOUS | Status: AC
  Filled 2012-02-02: qty 10000

## 2012-02-02 MED ORDER — ASPIRIN 81 MG PO CHEW
81.0000 mg | CHEWABLE_TABLET | Freq: Every day | ORAL | Status: DC
  Administered 2012-02-02: 81 mg via ORAL
  Filled 2012-02-02 (×2): qty 1

## 2012-02-02 MED ORDER — LISINOPRIL 20 MG PO TABS
20.0000 mg | ORAL_TABLET | Freq: Every day | ORAL | Status: DC
  Administered 2012-02-03: 20 mg via ORAL
  Filled 2012-02-02: qty 1

## 2012-02-02 MED ORDER — MIDAZOLAM HCL 5 MG/5ML IJ SOLN
INTRAMUSCULAR | Status: DC | PRN
  Administered 2012-02-02: 2 mg via INTRAVENOUS

## 2012-02-02 MED ORDER — HYDROMORPHONE HCL PF 1 MG/ML IJ SOLN
INTRAMUSCULAR | Status: AC
  Filled 2012-02-02: qty 1

## 2012-02-02 MED ORDER — SCOPOLAMINE 1 MG/3DAYS TD PT72
MEDICATED_PATCH | TRANSDERMAL | Status: AC
  Filled 2012-02-02: qty 1

## 2012-02-02 MED ORDER — PRAMIPEXOLE DIHYDROCHLORIDE 0.25 MG PO TABS
0.2500 mg | ORAL_TABLET | Freq: Every evening | ORAL | Status: DC
  Administered 2012-02-02: 0.25 mg via ORAL
  Filled 2012-02-02 (×2): qty 1

## 2012-02-02 MED ORDER — HYDROCHLOROTHIAZIDE 12.5 MG PO CAPS
12.5000 mg | ORAL_CAPSULE | Freq: Every day | ORAL | Status: DC
  Administered 2012-02-03: 12.5 mg via ORAL
  Filled 2012-02-02: qty 1

## 2012-02-02 MED ORDER — NEOSTIGMINE METHYLSULFATE 1 MG/ML IJ SOLN
INTRAMUSCULAR | Status: DC | PRN
  Administered 2012-02-02: 4 mg via INTRAVENOUS

## 2012-02-02 MED ORDER — ROCURONIUM BROMIDE 100 MG/10ML IV SOLN
INTRAVENOUS | Status: DC | PRN
  Administered 2012-02-02: 20 mg via INTRAVENOUS

## 2012-02-02 MED ORDER — GLYCOPYRROLATE 0.2 MG/ML IJ SOLN
INTRAMUSCULAR | Status: DC | PRN
  Administered 2012-02-02: .6 mg via INTRAVENOUS

## 2012-02-02 MED ORDER — METHOCARBAMOL 500 MG PO TABS
500.0000 mg | ORAL_TABLET | Freq: Four times a day (QID) | ORAL | Status: DC | PRN
  Administered 2012-02-03: 500 mg via ORAL
  Filled 2012-02-02: qty 1

## 2012-02-02 MED ORDER — ONDANSETRON HCL 4 MG/2ML IJ SOLN: 4.0000 mg | Freq: Four times a day (QID) | INTRAMUSCULAR | Status: DC | PRN

## 2012-02-02 MED ORDER — OXYCODONE-ACETAMINOPHEN 5-325 MG PO TABS
1.0000 | ORAL_TABLET | ORAL | Status: DC | PRN
  Administered 2012-02-02 – 2012-02-03 (×3): 2 via ORAL
  Filled 2012-02-02 (×3): qty 2

## 2012-02-02 MED ORDER — PHENOL 1.4 % MT LIQD: 1.0000 | OROMUCOSAL | Status: DC | PRN

## 2012-02-02 MED ORDER — SODIUM CHLORIDE 0.9 % IR SOLN
Status: DC | PRN
  Administered 2012-02-02: 15:00:00

## 2012-02-02 MED ORDER — METFORMIN HCL 500 MG PO TABS
1000.0000 mg | ORAL_TABLET | Freq: Two times a day (BID) | ORAL | Status: DC
  Administered 2012-02-02 – 2012-02-03 (×2): 1000 mg via ORAL
  Filled 2012-02-02 (×4): qty 2

## 2012-02-02 MED ORDER — LACTATED RINGERS IV SOLN
INTRAVENOUS | Status: DC
  Administered 2012-02-03: via INTRAVENOUS

## 2012-02-02 MED ORDER — CEFAZOLIN SODIUM 1-5 GM-% IV SOLN
1.0000 g | Freq: Four times a day (QID) | INTRAVENOUS | Status: AC
  Administered 2012-02-02 – 2012-02-03 (×3): 1 g via INTRAVENOUS
  Filled 2012-02-02 (×3): qty 50

## 2012-02-02 MED ORDER — HYDROCODONE-ACETAMINOPHEN 5-325 MG PO TABS: 1.0000 | ORAL_TABLET | ORAL | Status: DC | PRN

## 2012-02-02 MED ORDER — HEPARIN SODIUM (PORCINE) 5000 UNIT/ML IJ SOLN
5000.0000 [IU] | Freq: Two times a day (BID) | INTRAMUSCULAR | Status: DC
  Administered 2012-02-02 – 2012-02-03 (×2): 5000 [IU] via SUBCUTANEOUS
  Filled 2012-02-02 (×4): qty 1

## 2012-02-02 MED ORDER — METHOCARBAMOL 100 MG/ML IJ SOLN
500.0000 mg | Freq: Four times a day (QID) | INTRAVENOUS | Status: DC | PRN
  Filled 2012-02-02: qty 5

## 2012-02-02 MED ORDER — HYDROMORPHONE HCL PF 1 MG/ML IJ SOLN
0.5000 mg | INTRAMUSCULAR | Status: DC | PRN
  Administered 2012-02-03: 1 mg via INTRAVENOUS
  Filled 2012-02-02: qty 1

## 2012-02-02 MED ORDER — INSULIN ASPART 100 UNIT/ML ~~LOC~~ SOLN
0.0000 [IU] | Freq: Three times a day (TID) | SUBCUTANEOUS | Status: DC
  Administered 2012-02-03: 3 [IU] via SUBCUTANEOUS

## 2012-02-02 SURGICAL SUPPLY — 38 items
BAG ZIPLOCK 12X15 (MISCELLANEOUS) ×2 IMPLANT
CLEANER TIP ELECTROSURG 2X2 (MISCELLANEOUS) ×2 IMPLANT
CLOTH BEACON ORANGE TIMEOUT ST (SAFETY) ×2 IMPLANT
CLSR STERI-STRIP ANTIMIC 1/2X4 (GAUZE/BANDAGES/DRESSINGS) ×2 IMPLANT
DRAPE ORTHO SPLIT 77X108 STRL (DRAPES)
DRAPE SURG ORHT 6 SPLT 77X108 (DRAPES) IMPLANT
DRAPE U-SHAPE 47X51 STRL (DRAPES) IMPLANT
DRSG ADAPTIC 3X8 NADH LF (GAUZE/BANDAGES/DRESSINGS) ×2 IMPLANT
DRSG EMULSION OIL 3X16 NADH (GAUZE/BANDAGES/DRESSINGS) ×2 IMPLANT
DRSG PAD ABDOMINAL 8X10 ST (GAUZE/BANDAGES/DRESSINGS) ×2 IMPLANT
DURAPREP 26ML APPLICATOR (WOUND CARE) ×2 IMPLANT
ELECT REM PT RETURN 9FT ADLT (ELECTROSURGICAL) ×2
ELECTRODE REM PT RTRN 9FT ADLT (ELECTROSURGICAL) ×1 IMPLANT
GLOVE BIOGEL PI IND STRL 8 (GLOVE) ×1 IMPLANT
GLOVE BIOGEL PI IND STRL 8.5 (GLOVE) ×1 IMPLANT
GLOVE BIOGEL PI INDICATOR 8 (GLOVE) ×1
GLOVE BIOGEL PI INDICATOR 8.5 (GLOVE) ×1
GLOVE ECLIPSE 8.0 STRL XLNG CF (GLOVE) ×4 IMPLANT
GOWN PREVENTION PLUS LG XLONG (DISPOSABLE) ×4 IMPLANT
GOWN STRL REIN XL XLG (GOWN DISPOSABLE) ×4 IMPLANT
KIT BASIN OR (CUSTOM PROCEDURE TRAY) ×2 IMPLANT
MANIFOLD NEPTUNE II (INSTRUMENTS) IMPLANT
NS IRRIG 1000ML POUR BTL (IV SOLUTION) ×2 IMPLANT
PACK GENERAL/GYN (CUSTOM PROCEDURE TRAY) ×2 IMPLANT
PACK TOTAL JOINT (CUSTOM PROCEDURE TRAY) IMPLANT
POSITIONER SURGICAL ARM (MISCELLANEOUS) ×2 IMPLANT
SPONGE GAUZE 4X4 12PLY (GAUZE/BANDAGES/DRESSINGS) ×2 IMPLANT
SPONGE SURGIFOAM ABS GEL 100 (HEMOSTASIS) ×2 IMPLANT
STAPLER VISISTAT (STAPLE) IMPLANT
SUT MNCRL AB 4-0 PS2 18 (SUTURE) ×2 IMPLANT
SUT VIC AB 0 CT1 36 (SUTURE) IMPLANT
SUT VIC AB 1 CT1 27 (SUTURE) ×2
SUT VIC AB 1 CT1 27XBRD ANTBC (SUTURE) ×2 IMPLANT
SUT VIC AB 2-0 CT1 27 (SUTURE) ×1
SUT VIC AB 2-0 CT1 TAPERPNT 27 (SUTURE) ×1 IMPLANT
TAPE CLOTH SURG 4X10 WHT LF (GAUZE/BANDAGES/DRESSINGS) ×2 IMPLANT
TOWEL OR 17X26 10 PK STRL BLUE (TOWEL DISPOSABLE) ×4 IMPLANT
WATER STERILE IRR 1500ML POUR (IV SOLUTION) IMPLANT

## 2012-02-02 NOTE — Anesthesia Preprocedure Evaluation (Addendum)
Anesthesia Evaluation  Patient identified by MRN, date of birth, ID band Patient awake    Reviewed: Allergy & Precautions, H&P , NPO status , Patient's Chart, lab work & pertinent test results, reviewed documented beta blocker date and time   History of Anesthesia Complications (+) PONV and AWARENESS UNDER ANESTHESIA  Airway Mallampati: II TM Distance: >3 FB Neck ROM: Full    Dental  (+) Dental Advisory Given   Pulmonary sleep apnea ,  Mild OSA, no Rx breath sounds clear to auscultation        Cardiovascular hypertension, Pt. on medications Rhythm:Regular Rate:Normal  Heart cath 2009, no blockages   Neuro/Psych negative neurological ROS  negative psych ROS   GI/Hepatic negative GI ROS, Neg liver ROS,   Endo/Other  Diabetes mellitus-, Type 2, Oral Hypoglycemic AgentsFair control  Renal/GU negative Renal ROS  negative genitourinary   Musculoskeletal   Abdominal   Peds negative pediatric ROS (+)  Hematology negative hematology ROS (+)   Anesthesia Other Findings   Reproductive/Obstetrics negative OB ROS                          Anesthesia Physical Anesthesia Plan  ASA: III  Anesthesia Plan: General   Post-op Pain Management:    Induction: Intravenous  Airway Management Planned: LMA  Additional Equipment:   Intra-op Plan:   Post-operative Plan: Extubation in OR  Informed Consent:   Dental advisory given  Plan Discussed with: CRNA and Surgeon  Anesthesia Plan Comments:         Anesthesia Quick Evaluation

## 2012-02-02 NOTE — Preoperative (Signed)
Beta Blockers   Reason not to administer Beta Blockers:Not Applicable 

## 2012-02-02 NOTE — Transfer of Care (Signed)
Immediate Anesthesia Transfer of Care Note  Patient: Katelyn [name]  Procedure(s) Performed: Procedure(s) (LRB): EXCISION/RELEASE BURSA HIP (Right)  Patient Location: PACU  Anesthesia Type: General  Level of Consciousness: awake, alert , oriented and patient cooperative  Airway & Oxygen Therapy: Patient Spontanous Breathing and Patient connected to face mask oxygen  Post-op Assessment: Report given to PACU RN, Post -op Vital signs reviewed and stable and Patient moving all extremities  Post vital signs: Reviewed  Complications: No apparent anesthesia complications

## 2012-02-02 NOTE — Anesthesia Postprocedure Evaluation (Signed)
  Anesthesia Post-op Note  Patient: Katelyn [name]  Procedure(s) Performed: Procedure(s) (LRB): EXCISION/RELEASE BURSA HIP (Right)  Patient Location: PACU  Anesthesia Type: General  Level of Consciousness: oriented and sedated  Airway and Oxygen Therapy: Patient Spontanous Breathing and Patient connected to nasal cannula oxygen  Post-op Pain: mild  Post-op Assessment: Post-op Vital signs reviewed, Patient's Cardiovascular Status Stable, Respiratory Function Stable and Patent Airway  Post-op Vital Signs: stable  Complications: No apparent anesthesia complications

## 2012-02-02 NOTE — Interval H&P Note (Signed)
History and Physical Interval Note:  02/02/2012 2:47 PM  Katelyn [name]  has presented today for surgery, with the diagnosis of Right Hip Bursitis  The various methods of treatment have been discussed with the patient and family. After consideration of risks, benefits and other options for treatment, the patient has consented to  Procedure(s) (LRB): EXCISION/RELEASE BURSA HIP (Right) as a surgical intervention .  The patients' history has been reviewed, patient examined, no change in status, stable for surgery.  I have reviewed the patients' chart and labs.  Questions were answered to the patient's satisfaction.     [name] A

## 2012-02-02 NOTE — Brief Op Note (Signed)
02/02/2012  3:49 PM  PATIENT:  Katelyn [name]  55 y.o. female  PRE-OPERATIVE DIAGNOSIS:  Right Hip Bursitis  POST-OPERATIVE DIAGNOSIS:  right hip bursitis  PROCEDURE:  Procedure(s) (LRB): EXCISION/RELEASE BURSA HIP (Right)  SURGEON:  Surgeon(s) and Role:    * Jacki Cones, MD - Primary  PHYSICIAN ASSISTANT: Dimitri Ped PA    ANESTHESIA:   general  EBL:  Total I/O In: -  Out: 25 [Blood:25]  BLOOD ADMINISTERED:none  DRAINS: none   LOCAL MEDICATIONS USED:  BUPIVICAINE 20cc mixed with 20cc Normal Saline  SPECIMEN:  No Specimen  DISPOSITION OF SPECIMEN:  N/A  COUNTS:  YES  TOURNIQUET:  * No tourniquets in log *  DICTATION: .Other Dictation: Dictation Number [id number]  PLAN OF CARE: Admit for overnight observation  PATIENT DISPOSITION:  Leaving OR for PACU and is stable   Delay start of Pharmacological VTE agent (>24hrs) due to surgical blood loss or risk of bleeding: yes

## 2012-02-03 LAB — CBC
MCV: 94.1 fL (ref 78.0–100.0)
Platelets: 276 10*3/uL (ref 150–400)
RBC: 3.37 MIL/uL — ABNORMAL LOW (ref 3.87–5.11)
RDW: 12.6 % (ref 11.5–15.5)
WBC: 10.1 10*3/uL (ref 4.0–10.5)

## 2012-02-03 NOTE — Progress Notes (Signed)
Discharged from floor via w/c, spouse with pt. No changes in assessment. Lindsey Demonte   

## 2012-02-03 NOTE — Progress Notes (Signed)
Cm spoke with pt concerning dc planning. No MD or PT reccommendation for  [facility] services. Pt agrees with plan of care. Pt states ambulating well on own. Pt states having home DME for use. Pt's spouse to provide tx home and assist with home care.   Leonie Green [phone]

## 2012-02-03 NOTE — Op Note (Signed)
NAMEFARDOWSA, AUTHIER                   ACCOUNT NO.:  [id number]  MEDICAL RECORD NO.:  [id number]  LOCATION:  1621                         FACILITY:  [facility]  PHYSICIAN:  Georges Lynch. Isadore Palecek, M.D.DATE OF BIRTH:  03-Aug-1956  DATE OF PROCEDURE: DATE OF DISCHARGE:  02/03/2012                              OPERATIVE REPORT   SURGEON:  Windy Fast A. Darrelyn Hillock, M.D.  ASSISTANT:  Dimitri Ped, [place].  PREOPERATIVE DIAGNOSES: 1. Chronic greater trochanteric bursitis. 2. Chronic iliotibial band syndrome, right hip.  POSTOPERATIVE DIAGNOSES: 1. Chronic greater trochanteric bursitis. 2. Chronic iliotibial band syndrome, right hip.  OPERATION: 1. An elliptical excision of a portion of the iliotibial band directly     over the greater trochanter. 2. Bursectomy of the greater trochanteric bursa, right hip.  DESCRIPTION OF PROCEDURE:  Under general anesthesia, the patient on the left side, right side up, utilizing the PEG board, we did a sterile prep and draping of the hip.  She had 1 g of IV Ancef.  At this particular time, the appropriate time-out was carried out prior to an incision.  I also marked the appropriate right leg in the holding area.  Following that, an incision was made over the greater trochanteric area.  Bleeders were identified and cauterized.  The incision was carried down through the subcutaneous tissue to the iliotibial band.  The iliotibial band was extremely tight over the greater trochanter.  I first made an incision to help release the tension, and then, removed an elliptical portion of the iliotibial band.  The greater trochanter was now free.  There was no further tension.  She had a rather thickened greater trochanteric bursa, that I removed.  There was no defect in the underlying musculotendinous junction.  I thoroughly irrigated out the knee, held good pressure on the area to make sure we had good hemostasis.  I then inserted a piece of Gelfoam soaked in thrombin directly  over the greater trochanter.  I then closed the subcu in the usual fashion in two layers,  and then, we utilized a subcuticular suture.  I did inject a mixture of 20 cc of Exparel and 20 cc of normal saline into the soft tissue structures.  Great care was taken not to inject any into the vascular structures.  We did utilize antibiotic irrigation prior to injection of the wound and prior to closing the wound.  Sterile dressings were applied.          ______________________________ Georges Lynch Darrelyn Hillock, M.D.     RAG/MEDQ  D:  02/02/2012  T:  02/03/2012  Job:  [id number]

## 2012-02-03 NOTE — Progress Notes (Signed)
Orthopedics Progress Note  Subjective: Pt feeling much better this morning s/p surgery yesterday. Ready to go home  Objective:  Filed Vitals:   02/03/12 0453  BP: 115/65  Pulse: 70  Temp: 98.2 F (36.8 C)  Resp: 16    General: Awake and alert  Musculoskeletal: right hip incision healing well, nv intact distally Neurovascularly intact  Lab Results  Component Value Date   WBC 10.1 02/03/2012   HGB 10.6* 02/03/2012   HCT 31.7* 02/03/2012   MCV 94.1 02/03/2012   PLT 276 02/03/2012       Component Value Date/Time   NA 137 01/31/2012 1530   K 5.2* 01/31/2012 1530   CL 102 01/31/2012 1530   CO2 29 01/31/2012 1530   GLUCOSE 176* 01/31/2012 1530   BUN 19 01/31/2012 1530   CREATININE 0.80 01/31/2012 1530   CALCIUM 9.7 01/31/2012 1530   GFRNONAA 82* 01/31/2012 1530   GFRAA >90 01/31/2012 1530    Lab Results  Component Value Date   INR 0.85 01/31/2012   INR 0.97 06/20/2011   INR 0.87 08/26/2010    Assessment/Plan: POD #1 s/p Procedure(s): EXCISION/RELEASE BURSA HIP  D/c home today  F/u in 2 weeks  Almedia Balls. Ranell Patrick, MD 02/03/2012 7:31 AM

## 2012-02-03 NOTE — Progress Notes (Signed)
PT NOTE:  Order received.  Pt states she has been up and around in room without difficulty and does not feel she will have any problems at home as the pain is less than before surgery.  Pt states she has all equipment in the home and no stairs to navigate.  PT deferred at pt request.

## 2012-02-04 NOTE — Discharge Summary (Signed)
Physician Discharge Summary  Patient ID: Katelyn [name] MRN: [id number] DOB/AGE: [birth year] 56 y.o.  Admit date: 02/02/2012 Discharge date: 02/04/2012  Admission Diagnoses:  Active Problems:  Greater trochanteric bursitis of right hip  Iliotibial band syndrome of right side   Discharge Diagnoses:  Same   Surgeries: Procedure(s): EXCISION/RELEASE BURSA HIP on 02/02/2012   Consultants: none  Discharged Condition: Stable  Hospital Course: Katelyn [name] is an 56 y.o. female who was admitted 02/02/2012 with a chief complaint of No chief complaint on file. , and found to have a diagnosis of <principal problem not specified>.  They were brought to the operating room on 02/02/2012 and underwent the above named procedures.    The patient had an uncomplicated hospital course and was stable for discharge.  Recent vital signs:  Filed Vitals:   02/03/12 0944  BP: 129/77  Pulse: 75  Temp: 98 F (36.7 C)  Resp: 14    Recent laboratory studies:  Results for orders placed during the hospital encounter of 02/02/12  GLUCOSE, CAPILLARY      Component Value Range   Glucose-Capillary 160 (*) 70 - 99 (mg/dL)  GLUCOSE, CAPILLARY      Component Value Range   Glucose-Capillary 167 (*) 70 - 99 (mg/dL)  CBC      Component Value Range   WBC 10.1  4.0 - 10.5 (K/uL)   RBC 3.37 (*) 3.87 - 5.11 (MIL/uL)   Hemoglobin 10.6 (*) 12.0 - 15.0 (g/dL)   HCT 40.9 (*) 81.1 - 46.0 (%)   MCV 94.1  78.0 - 100.0 (fL)   MCH 31.5  26.0 - 34.0 (pg)   MCHC 33.4  30.0 - 36.0 (g/dL)   RDW 91.4  78.2 - 95.6 (%)   Platelets 276  150 - 400 (K/uL)  GLUCOSE, CAPILLARY      Component Value Range   Glucose-Capillary 223 (*) 70 - 99 (mg/dL)    Discharge Medications:   Medication List  As of 02/04/2012  9:00 AM   TAKE these medications         aspirin 81 MG tablet   Take 81 mg by mouth at bedtime.      CALCIUM 600+D PO   Take 1 tablet by mouth 2 (two) times daily.      DULoxetine 30 MG capsule   Commonly known as:  CYMBALTA   Take 30 mg by mouth daily after breakfast. Total of 90 mg      DULoxetine 60 MG capsule   Commonly known as: CYMBALTA   Take 60 mg by mouth daily after breakfast. Total of 90 mg      EQL GUMMY ADULT Chew   Chew 2 tablets by mouth every morning.      esomeprazole 40 MG capsule   Commonly known as: NEXIUM   Take 40 mg by mouth daily before breakfast.      HYDROcodone-acetaminophen 5-500 MG per tablet   Commonly known as: VICODIN   Take 1 tablet by mouth every 6 (six) hours as needed. Pain        lisinopril-hydrochlorothiazide 20-12.5 MG per tablet   Commonly known as: PRINZIDE,ZESTORETIC   Take 1 tablet by mouth daily after breakfast.      meloxicam 15 MG tablet   Commonly known as: MOBIC   Take 7.5 mg by mouth 2 (two) times daily as needed.      naproxen sodium 220 MG tablet   Commonly known as: ANAPROX   Take 220 mg by mouth 2 (  two) times daily as needed. Pain        OVER THE COUNTER MEDICATION   Take 1 tablet by mouth 2 (two) times daily. raspberry ketones supplement        pramipexole 0.25 MG tablet   Commonly known as: MIRAPEX   Take 0.25 mg by mouth every evening.      sitaGLIPtan-metformin 50-1000 MG per tablet   Commonly known as: JANUMET   Take 1 tablet by mouth 2 (two) times daily with a meal.            Diagnostic Studies: Dg Chest 2 View  01/31/2012  *RADIOLOGY REPORT*  Clinical Data: Preop right hip surgery  CHEST - 2 VIEW  Comparison: 08/31/2010  Findings: The heart size and mediastinal contours are within normal limits.  Both lungs are clear.  The visualized skeletal structures are unremarkable.  IMPRESSION: Negative exam.  Original Report Authenticated By: Rosealee Albee, M.D.    Disposition: 01-Home or Self Care  Discharge Orders    Future Orders Please Complete By Expires   Diet - low sodium heart healthy      Call MD / Call 911      Comments:   If you experience chest pain or shortness of breath, CALL 911 and be transported  to the hospital emergency room.  If you develope a fever above 101 F, pus (white drainage) or increased drainage or redness at the wound, or calf pain, call your surgeon's office.   Constipation Prevention      Comments:   Drink plenty of fluids.  Prune juice may be helpful.  You may use a stool softener, such as Colace (over the counter) 100 mg twice a day.  Use MiraLax (over the counter) for constipation as needed.   Increase activity slowly as tolerated      Weight Bearing as taught in Physical Therapy      Comments:   Use a walker or crutches as instructed.         Signed: Floyd Lusignan B 02/04/2012, 9:00 AM

## 2012-02-05 LAB — GLUCOSE, CAPILLARY: Glucose-Capillary: 171 mg/dL — ABNORMAL HIGH (ref 70–99)

## 2012-02-06 ENCOUNTER — Encounter (HOSPITAL_COMMUNITY): Payer: Self-pay | Admitting: Orthopedic Surgery

## 2012-02-08 NOTE — Addendum Note (Signed)
Addendum  created 02/08/12 1042 by Trellis Paganini, CRNA   Modules edited:Anesthesia Events, Anesthesia Medication Administration

## 2012-02-08 NOTE — Addendum Note (Signed)
Addendum  created 02/08/12 1042 by Hoyle Barkdull N Sammy Cassar, CRNA   Modules edited:Anesthesia Events, Anesthesia Medication Administration    

## 2013-01-14 IMAGING — RF DG MYELOGRAM LUMBAR
12 of 21 series · 12 of 21 positions shown · IV contrast (omnipaque)
Comparison: Preoperative MRI

CLINICAL DATA: Low back pain.  Right hip pain.  Left leg pain.
Previous discectomy and fusion L3-4 and L4-5.

 MYELOGRAM INJECTION
TECHNIQUE: Informed consent was obtained from the patient prior to
the procedure, including potential complications of headache,
allergy, infection and pain.  A timeout procedure was performed.
With the patient prone, the lower back was prepped with Betadine.
1% Lidocaine was used for local anesthesia.  Lumbar puncture was
performed at the right L1-2 level using a 22 gauge needle with
return of clear CSF.  18 ml of Omnipaque 688was injected into the
subarachnoid space .
TECHNIQUE: Following injection of intrathecal Omnipaque contrast,
spine imaging in multiple projections was performed using
fluoroscopy.
Fluoroscopy Time: 40 seconds .
TECHNIQUE: CT imaging of the lumbar spine was performed after
intrathecal contrast administration.  Multiplanar CT image
reconstructions were also generated.

[Series 1: (hospital) · 1 of 1 slices shown]
[im 1/1]
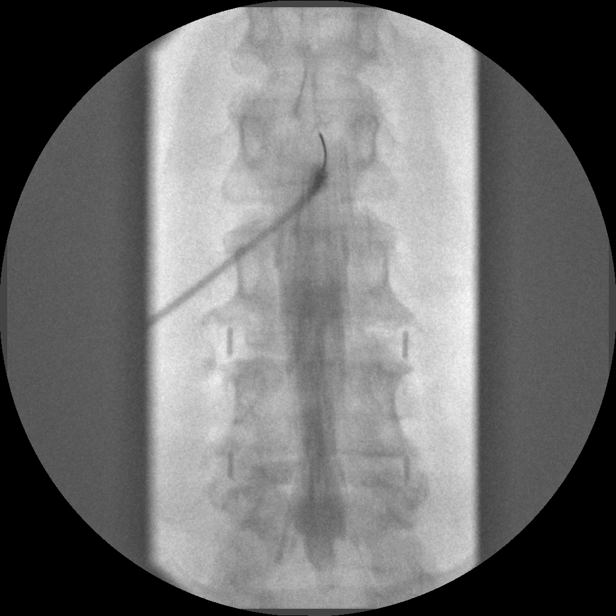

[Series 3: myelogram  white · 1 of 1 slices shown (1 of 9)]
[im 1/1]
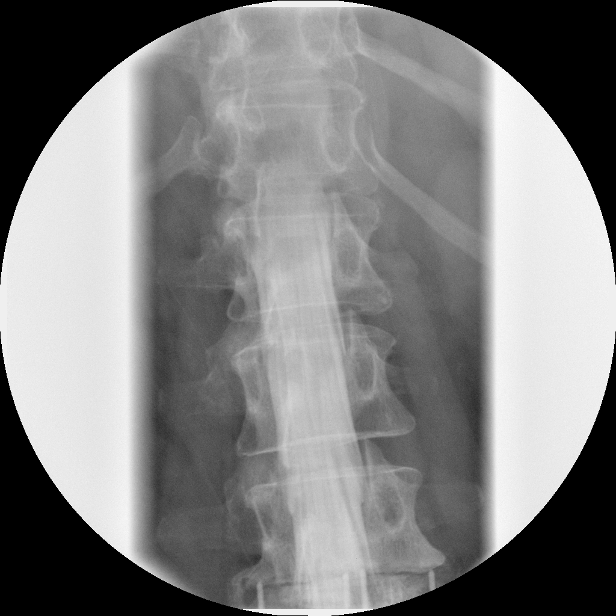

[Series 5: myelogram  white · 1 of 1 slices shown (2 of 9)]
[im 1/1]
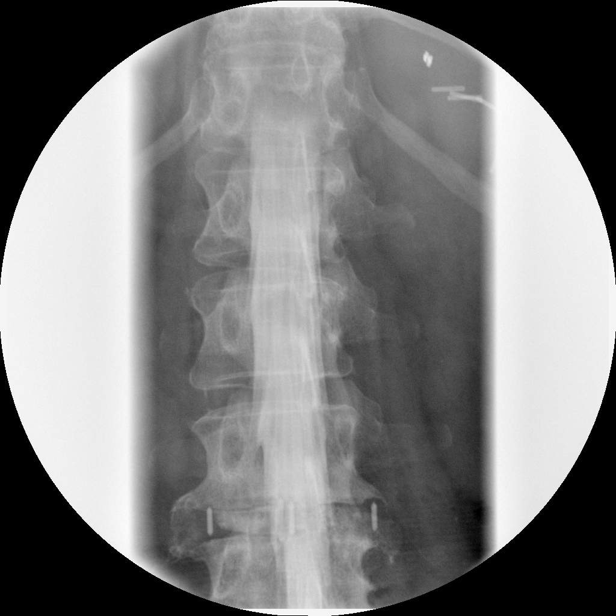

[Series 7: myelogram  white · 1 of 1 slices shown (3 of 9)]
[im 1/1]
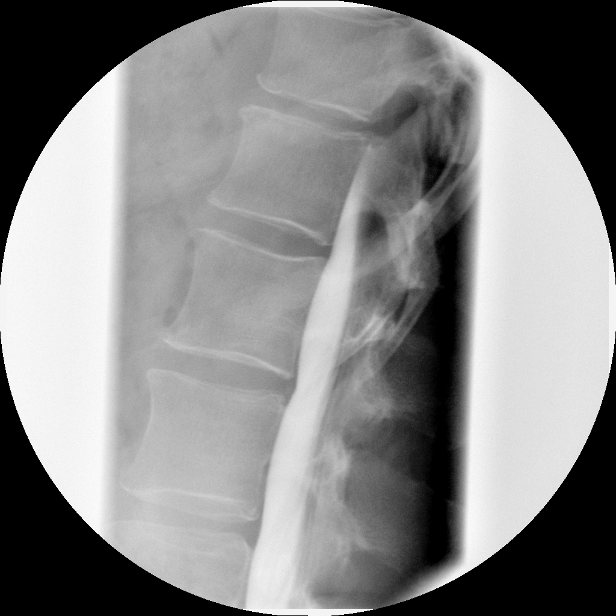

[Series 8: myelogram  white · 1 of 1 slices shown (4 of 9)]
[im 1/1]
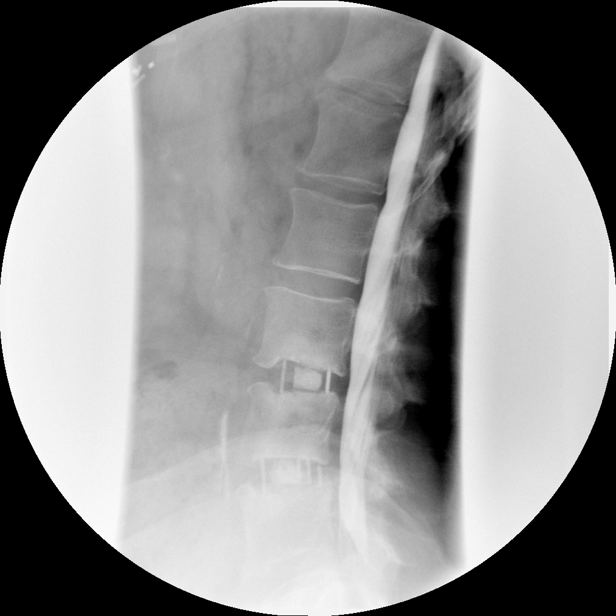

[Series 10: myelogram  white · 1 of 1 slices shown (5 of 9)]
[im 1/1]
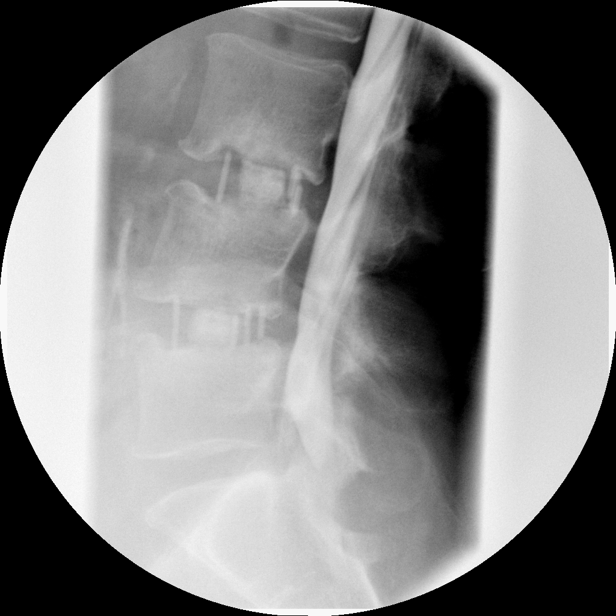

[Series 12: myelogram  white · 1 of 1 slices shown (6 of 9)]
[im 1/1]
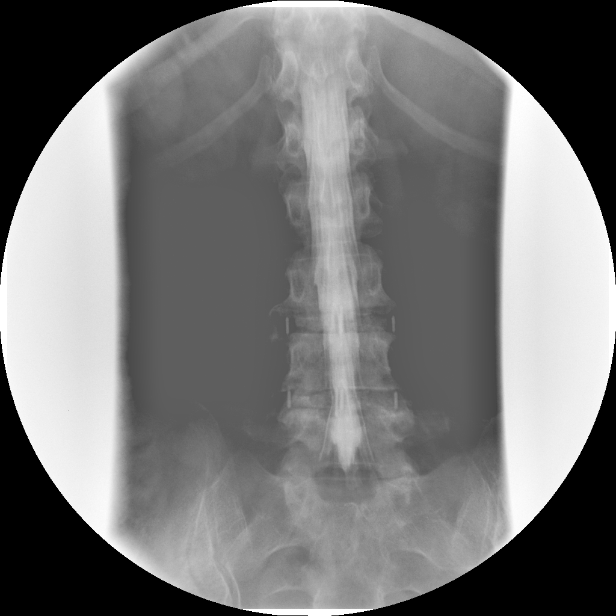

[Series 14: myelogram  white · 1 of 1 slices shown (7 of 9)]
[im 1/1]
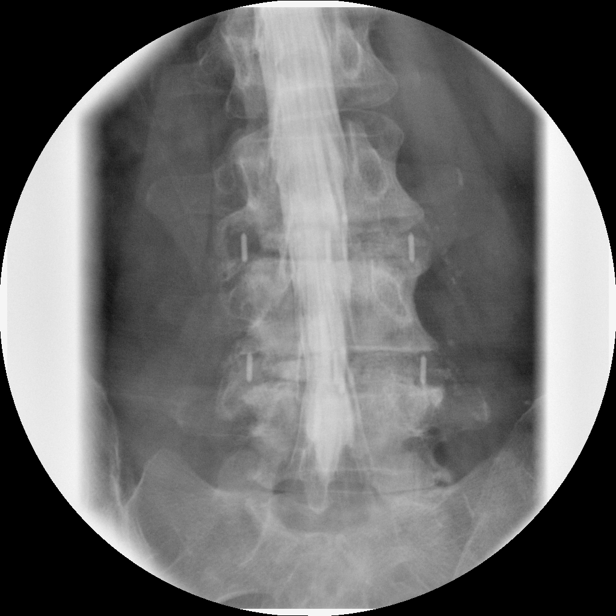

[Series 15: myelogram  white · 1 of 1 slices shown (8 of 9)]
[im 1/1]
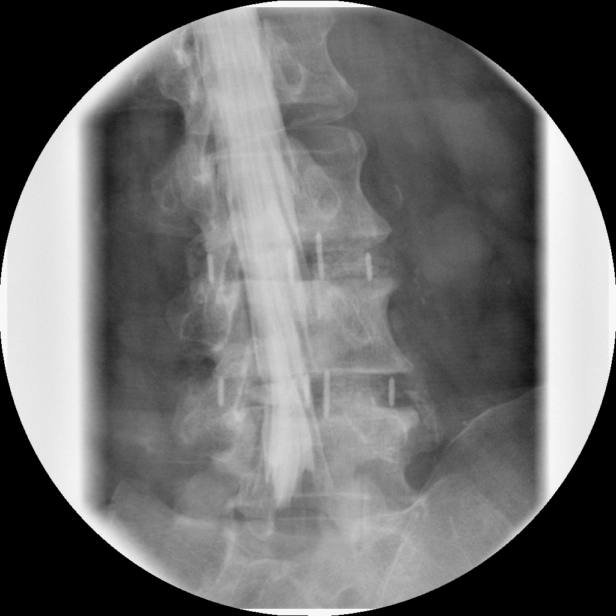

[Series 17: myelogram  white · 1 of 1 slices shown (9 of 9)]
[im 1/1]
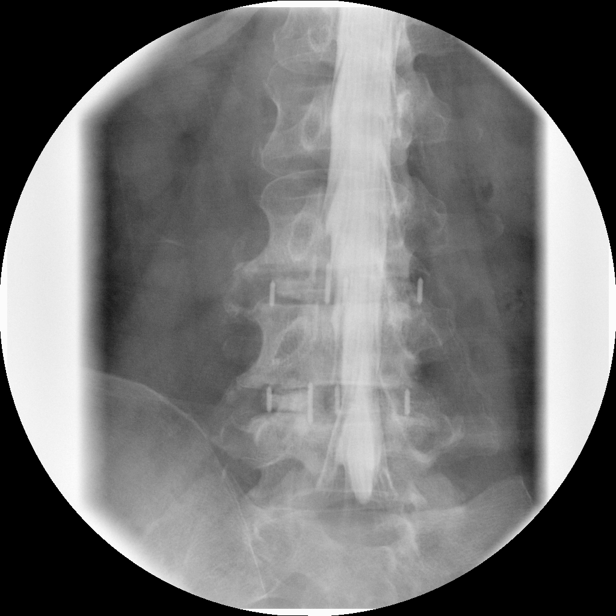

[Series 1002: view not recorded · 0.20mm/px · 1 of 1 slices shown (1 of 2)]
[im 1/1]
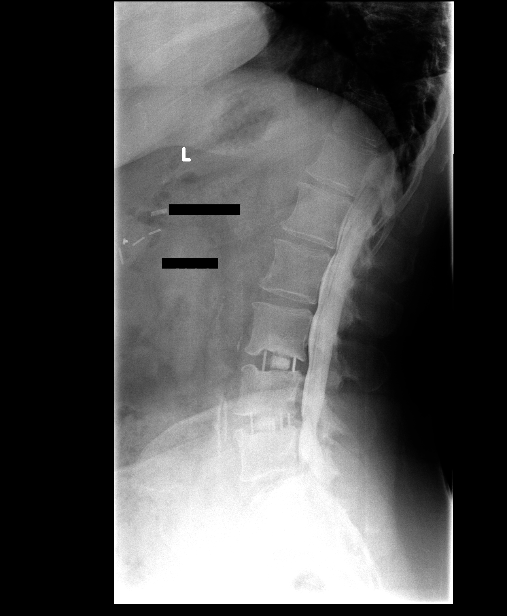

[Series 1004: view not recorded · 0.20mm/px · 1 of 1 slices shown (2 of 2)]
[im 1/1]
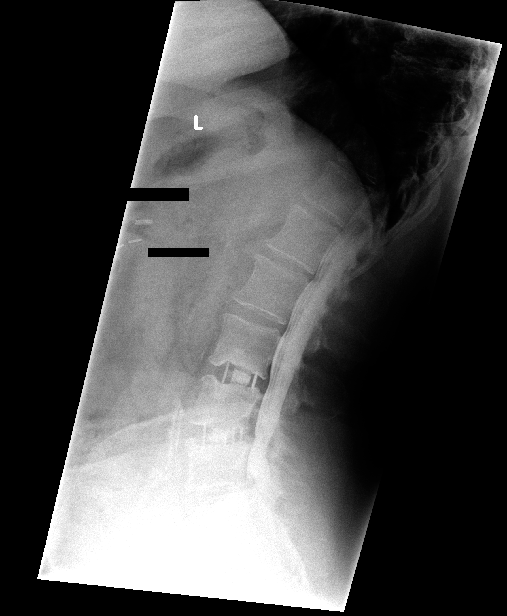

[12 of 21 positions shown; findings below may reference images not displayed]

IMPRESSION: Successful injection of  intrathecal contrast for myelography.

MYELOGRAM LUMBAR
FINDINGS: No abnormality is seen at L2-3 or above.  The canal is
widely patent.

At L3-4, the patient has had previous discectomy from a lateral
approach.  Interbody material appears grossly well positioned.
There is minor subsidence.  There are mild sclerotic changes of the
endplates.  There is very mild narrowing of the lateral recesses.
Standing flexion extension views do not show definite motion.  One
could suggest that there is slight widening anteriorly with
extension, and slight widening posteriorly with flexion.  This
could be due to projectional differences however.

At L4-5, the patient has had previous discectomy from a lateral
approach.  Interbody material appears grossly well positioned.
There is mild subsidence and endplate sclerosis.  No definite
motion occurs with flexion and extension.  Similarly, one could
question very minimal anterior widening with extension.  There is a
small anterior extradural defect.  There is narrowing of the
lateral recesses at this level that could possibly cause neural
compression on either side.

L5-S1 does not show any stenosis or abnormality of root sleeve
filling.
IMPRESSION: The patient has had previous lateral approach discectomy and fusion
procedures at L3-4 and L4-5.  I am not convinced that there is
definite solid union.  Flexion/extension views suggest mild rocking
motion, more notable at L3-4 than at L4-5, but one could argue that
these findings are projectional rather than indicating true motion.
There does not appear to be any compressive stenosis at L3-4.  At
L4-5, there is narrowing of both lateral recesses that could
possibly affect the L5 nerve roots.

CT MYELOGRAPHY LUMBAR SPINE
FINDINGS: T12-L1:  Normal.  Conus tip at upper L1.

L1-2:  Normal interspace.

L2-3:  Normal interspace.

L3-4:  There has been previous discectomy and fusion procedure from
a lateral approach.  Interbody fusion material is well positioned.
There is mild subsidence.  There are some cystic changes of the
endplates with some sclerosis.  I cannot state that there is
definite solid union. However, there is not definite evidence of
motion such as vacuum phenomenon.  There is only mild narrowing of
the lateral recesses.

L4-5:  There has been previous discectomy and fusion procedure from
a lateral approach.  Interbody fusion material is well positioned.
There is mild subsidence.  There are some cystic changes of the
endplates with sclerosis.  I cannot state that there is definite
solid union.  No definite evidence of motion such as vacuum
phenomenon however.  There is broad-based annular bulging.  There
is narrowing of both lateral recesses.  There is mild foraminal
narrowing left worse than right.

L5-S1:  Minimal bulging of the disc.  The canal and foramina are
widely patent.  Mild facet degeneration on the right without
slippage or stenosis.
IMPRESSION: Previous discectomy and fusion procedure at L3-4 from a lateral
approach.  Interbody material appears well positioned.  I cannot
state that there is definite solid union however.  There is mild
bulging of the annulus with mild narrowing of the lateral recesses,
not grossly compressive.

Previous discectomy and fusion procedure at L4-5 from the lateral
approach.  Interbody material appears well positioned.  I cannot
state that there is definite solid union at this level on either.
There is more prominent circumferential bulging of the annulus.
There is narrowing of the lateral recesses that could possibly
cause neural compression.  Additionally, there is foraminal
narrowing left more than right.

## 2014-04-20 DIAGNOSIS — K222 Esophageal obstruction: Secondary | ICD-10-CM | POA: Insufficient documentation

## 2014-04-20 DIAGNOSIS — N200 Calculus of kidney: Secondary | ICD-10-CM | POA: Insufficient documentation

## 2014-10-09 DIAGNOSIS — Z9289 Personal history of other medical treatment: Secondary | ICD-10-CM | POA: Insufficient documentation

## 2014-10-09 HISTORY — DX: Personal history of other medical treatment: Z92.89

## 2015-10-15 DIAGNOSIS — M5416 Radiculopathy, lumbar region: Secondary | ICD-10-CM | POA: Insufficient documentation

## 2015-10-20 DIAGNOSIS — Z6834 Body mass index (BMI) 34.0-34.9, adult: Secondary | ICD-10-CM | POA: Diagnosis not present

## 2015-10-20 DIAGNOSIS — D509 Iron deficiency anemia, unspecified: Secondary | ICD-10-CM | POA: Diagnosis not present

## 2015-10-20 DIAGNOSIS — E1142 Type 2 diabetes mellitus with diabetic polyneuropathy: Secondary | ICD-10-CM | POA: Diagnosis not present

## 2015-10-20 DIAGNOSIS — I10 Essential (primary) hypertension: Secondary | ICD-10-CM | POA: Diagnosis not present

## 2015-10-20 DIAGNOSIS — E784 Other hyperlipidemia: Secondary | ICD-10-CM | POA: Diagnosis not present

## 2015-10-20 DIAGNOSIS — R740 Nonspecific elevation of levels of transaminase and lactic acid dehydrogenase [LDH]: Secondary | ICD-10-CM | POA: Diagnosis not present

## 2015-10-20 DIAGNOSIS — M5416 Radiculopathy, lumbar region: Secondary | ICD-10-CM | POA: Diagnosis not present

## 2015-10-20 DIAGNOSIS — D508 Other iron deficiency anemias: Secondary | ICD-10-CM | POA: Diagnosis not present

## 2015-10-20 DIAGNOSIS — E1149 Type 2 diabetes mellitus with other diabetic neurological complication: Secondary | ICD-10-CM | POA: Diagnosis not present

## 2015-10-28 DIAGNOSIS — M5416 Radiculopathy, lumbar region: Secondary | ICD-10-CM | POA: Diagnosis not present

## 2015-11-02 DIAGNOSIS — M5416 Radiculopathy, lumbar region: Secondary | ICD-10-CM | POA: Diagnosis not present

## 2015-11-05 ENCOUNTER — Other Ambulatory Visit (HOSPITAL_COMMUNITY): Payer: Self-pay | Admitting: Neurological Surgery

## 2015-11-18 ENCOUNTER — Encounter (HOSPITAL_COMMUNITY)
Admission: RE | Admit: 2015-11-18 | Discharge: 2015-11-18 | Disposition: A | Discharge status: Home / Self Care | Payer: PPO | Source: Ambulatory Visit | Attending: Neurological Surgery | Admitting: Neurological Surgery

## 2015-11-18 ENCOUNTER — Ambulatory Visit (HOSPITAL_COMMUNITY)
Admission: RE | Admit: 2015-11-18 | Discharge: 2015-11-18 | Disposition: A | Discharge status: Home / Self Care | Payer: PPO | Source: Ambulatory Visit | Attending: Neurological Surgery | Admitting: Neurological Surgery

## 2015-11-18 ENCOUNTER — Encounter (HOSPITAL_COMMUNITY): Payer: Self-pay

## 2015-11-18 DIAGNOSIS — E119 Type 2 diabetes mellitus without complications: Secondary | ICD-10-CM | POA: Insufficient documentation

## 2015-11-18 DIAGNOSIS — K219 Gastro-esophageal reflux disease without esophagitis: Secondary | ICD-10-CM | POA: Diagnosis not present

## 2015-11-18 DIAGNOSIS — D649 Anemia, unspecified: Secondary | ICD-10-CM | POA: Diagnosis not present

## 2015-11-18 DIAGNOSIS — I447 Left bundle-branch block, unspecified: Secondary | ICD-10-CM | POA: Diagnosis not present

## 2015-11-18 DIAGNOSIS — Z79899 Other long term (current) drug therapy: Secondary | ICD-10-CM | POA: Diagnosis not present

## 2015-11-18 DIAGNOSIS — E785 Hyperlipidemia, unspecified: Secondary | ICD-10-CM | POA: Insufficient documentation

## 2015-11-18 DIAGNOSIS — IMO0002 Reserved for concepts with insufficient information to code with codable children: Secondary | ICD-10-CM

## 2015-11-18 DIAGNOSIS — I10 Essential (primary) hypertension: Secondary | ICD-10-CM | POA: Insufficient documentation

## 2015-11-18 DIAGNOSIS — Z01818 Encounter for other preprocedural examination: Secondary | ICD-10-CM | POA: Diagnosis not present

## 2015-11-18 DIAGNOSIS — Z9889 Other specified postprocedural states: Secondary | ICD-10-CM | POA: Diagnosis not present

## 2015-11-18 DIAGNOSIS — G4733 Obstructive sleep apnea (adult) (pediatric): Secondary | ICD-10-CM | POA: Diagnosis not present

## 2015-11-18 DIAGNOSIS — Z794 Long term (current) use of insulin: Secondary | ICD-10-CM | POA: Insufficient documentation

## 2015-11-18 DIAGNOSIS — Z7982 Long term (current) use of aspirin: Secondary | ICD-10-CM | POA: Diagnosis not present

## 2015-11-18 DIAGNOSIS — Z01812 Encounter for preprocedural laboratory examination: Secondary | ICD-10-CM | POA: Insufficient documentation

## 2015-11-18 HISTORY — DX: Anxiety disorder, unspecified: F41.9

## 2015-11-18 HISTORY — DX: Unspecified osteoarthritis, unspecified site: M19.90

## 2015-11-18 HISTORY — DX: Anemia, unspecified: D64.9

## 2015-11-18 LAB — BASIC METABOLIC PANEL
ANION GAP: 12 (ref 5–15)
BUN: 28 mg/dL — ABNORMAL HIGH (ref 6–20)
CHLORIDE: 102 mmol/L (ref 101–111)
CO2: 27 mmol/L (ref 22–32)
CREATININE: 1.49 mg/dL — AB (ref 0.44–1.00)
Calcium: 9.8 mg/dL (ref 8.9–10.3)
GFR calc non Af Amer: 38 mL/min — ABNORMAL LOW (ref >60)
GFR, EST AFRICAN AMERICAN: 44 mL/min — AB (ref >60)
Glucose, Bld: 187 mg/dL — ABNORMAL HIGH (ref 65–99)
POTASSIUM: 4.4 mmol/L (ref 3.5–5.1)
Sodium: 141 mmol/L (ref 135–145)

## 2015-11-18 LAB — CBC WITH DIFFERENTIAL/PLATELET
Basophils Absolute: 0.1 10*3/uL (ref 0.0–0.1)
Basophils Relative: 1 %
Eosinophils Absolute: 0.6 10*3/uL (ref 0.0–0.7)
Eosinophils Relative: 7 %
HEMATOCRIT: 36.2 % (ref 36.0–46.0)
HEMOGLOBIN: 11.9 g/dL — AB (ref 12.0–15.0)
LYMPHS ABS: 2.1 10*3/uL (ref 0.7–4.0)
LYMPHS PCT: 22 %
MCH: 31.4 pg (ref 26.0–34.0)
MCHC: 32.9 g/dL (ref 30.0–36.0)
MCV: 95.5 fL (ref 78.0–100.0)
MONOS PCT: 6 %
Monocytes Absolute: 0.6 10*3/uL (ref 0.1–1.0)
NEUTROS ABS: 6.1 10*3/uL (ref 1.7–7.7)
NEUTROS PCT: 64 %
Platelets: 322 10*3/uL (ref 150–400)
RBC: 3.79 MIL/uL — AB (ref 3.87–5.11)
RDW: 13.4 % (ref 11.5–15.5)
WBC: 9.5 10*3/uL (ref 4.0–10.5)

## 2015-11-18 LAB — SURGICAL PCR SCREEN
MRSA, PCR: NEGATIVE
STAPHYLOCOCCUS AUREUS: NEGATIVE

## 2015-11-18 LAB — PROTIME-INR
INR: 0.98 (ref 0.00–1.49)
Prothrombin Time: 13.2 seconds (ref 11.6–15.2)

## 2015-11-18 LAB — GLUCOSE, CAPILLARY: GLUCOSE-CAPILLARY: 192 mg/dL — AB (ref 65–99)

## 2015-11-18 NOTE — Pre-Procedure Instructions (Signed)
Katelyn [name]  11/18/2015      [facility] [address], [place] [id number] [place] [id number] Phone: [phone] Fax: [phone]    Your procedure is scheduled on 11/26/15.  Report to [facility] Admitting at 8 A.M.  Call this number if you have problems the morning of surgery:  [phone]   Remember:  Do not eat food or drink liquids after midnight.  Take these medicines the morning of surgery with A SIP OF WATER --cymbalta,pepsid,oxycodone   Do not wear jewelry, make-up or nail polish.  Do not wear lotions, powders, or perfumes.  You may wear deodorant.  Do not shave 48 hours prior to surgery.  Men may shave face and neck.  Do not bring valuables to the hospital.  [facility] is not responsible for any belongings or valuables.  Contacts, dentures or bridgework may not be worn into surgery.  Leave your suitcase in the car.  After surgery it may be brought to your room.  For patients admitted to the hospital, discharge time will be determined by your treatment team.  Patients discharged the day of surgery will not be allowed to drive home.   Name and phone number of your driver:   Special instructions:   Please read over the following fact sheets that you were given. Pain Booklet, Coughing and Deep Breathing and MRSA Information How to Manage Your Diabetes Before Surgery   Why is it important to control my blood sugar before and after surgery?   Improving blood sugar levels before and after surgery helps healing and can limit problems.  A way of improving blood sugar control is eating a healthy diet by:  - Eating less sugar and carbohydrates  - Increasing activity/exercise  - Talk with your doctor about reaching your blood sugar goals  High blood sugars (greater than 180 mg/dL) can raise your risk of infections and slow down your recovery so you will need to focus on controlling your diabetes during the weeks  before surgery.  Make sure that the doctor who takes care of your diabetes knows about your planned surgery including the date and location.  How do I manage my blood sugars before surgery?   Check your blood sugar at least 4 times a day, 2 days before surgery to make sure that they are not too high or low.   Check your blood sugar the morning of your surgery when you wake up and every 2               hours until you get to the Short-Stay unit.  If your blood sugar is less than 70 mg/dL, you will need to treat for low blood sugar by:  Treat a low blood sugar (less than 70 mg/dL) with 1/2 cup of clear juice (cranberry or apple), 4 glucose tablets, OR glucose gel.  Recheck blood sugar in 15 minutes after treatment (to make sure it is greater than 70 mg/dL).  If blood sugar is not greater than 70 mg/dL on re-check, call [phone] for further instructions.   Report your blood sugar to the Short-Stay nurse when you get to Short-Stay.  References:  [facility], 2007 "How to Manage your Diabetes Before and After Surgery".  What do I do about my diabetes medications?   Do not take oral diabetes medicines (pills) the morning of su    THE MORNING OF SURGER  Do not take other diabetes injectables the day of surgery including Byetta, Victoza, Bydureon, and Trulicity.    If your CBG is greater than 220 mg/dL, you may take 1/2 of your sliding scale (correction) dose of insulin.   For patients with "Insulin Pumps":  Contact your diabetes doctor for specific instructions before surgery.   Decrease basal insulin rates by 20% at midnight the night before surgery.  Note that if your surgery is planned to be longer than 2 hours, your insulin pump will be removed and intravenous (IV) insulin will be started and managed by the nurses and anesthesiologist.  You will be able to restart your insulin pump once you are awake and able to manage it.  Make sure to  bring insulin pump supplies to the hospital with you in case your site needs to be changed.

## 2015-11-19 NOTE — Progress Notes (Addendum)
Anesthesia Chart Review:  Pt is a 60 year old female scheduled for L2-3 extraforaminal microdiscectomy 11/26/2015 with Dr. Ronnald Ramp.   Cardiologist is Dr. Shirlee More in [place].   PMH includes:  HTN, LBBB, OSA (mild, not on CPAP), DM, hyperlipidemia, anemia, GERD, post-op N/V. Never smoker. BMI 34  Medications include: ASA, pepcid, lasix, novolog, levemir, iron, lisinopril-hctz, metformin.  Preoperative labs reviewed.  Glucose 187  Chest x-ray 11/18/15 reviewed. No active cardiopulmonary disease.   EKG, stress and echo pending from Dr. Joya Gaskins office.   Willeen Cass, FNP-BC [facility] Stay Surgical [facility]/Anesthesiology Phone: [phone] 11/19/2015 1:27 PM  Addendum: Records received from [facility] (since [facility] transitioned to [facility] in 11/2014. Of note, I did contact [facility] who said they do not have records of patient being seen since 11/10/14 and did not see a stress echo in their system.)  Last cardiology visit was on 11/10/14 with Dr. Bettina Gavia. She was very worried about CAD because of her risk factors and going DOE and fatigue. He felt her symptoms were attributed to anemia (transfused 1 unit PRBC for hgb 8), but mentioned ordering a stress echo to reassure her. I did not receive a copy of a stress echo. 'm not sure if    11/03/14 Echo ([place]): Conclusion: Concentric hypertrophy of the left ventricle. Visual EF 50-55%. Doppler evidence of grade 1 (impaired) diastolic dysfunction. Left atrial cavity is mildly dilated.  04/03/13 Cardiac Cath [facility]; Dr. Ricky Ala): Conclusions: Only lesion seen as in the mid circumflex-50% mid CX (59mm in length). Other vessels clear. Normal LV function, EF 60%, and EDP. Recommendations: Recommend workup for noncardiac causes of symptoms.  Last EKG received was from 10/20/14 and showed SR, left BBB.  There is not a prior EKG or stress  echo/stress test at [facility].  Voice message left with Lorriane Shire regarding need for cardiac clearance.   George Hugh [facility] Short Stay Center/Anesthesiology Phone [phone]  11/22/2015 12:23 PM  Addendum: Patient reported that Dr. Bettina Gavia was out of the country for the next month and staff would not schedule her with one of his partners, so she was seen by cardiologist Dr. Domenic Polite with [facility] on 11/24/15. He reviewed her prior testing and stated, "At this point do not anticipate further cardiac testing prior to proceeding with planned lumbar microdiscectomy. She should be able to proceed at an acceptable perioperative cardiac risk, generally in the low to intermediate range." She will keep on-going cardiology follow-up with Dr. Bettina Gavia.  George Hugh [facility] Short Stay Center/Anesthesiology Phone [phone] 11/25/2015 11:53 AM

## 2015-11-24 ENCOUNTER — Encounter: Payer: Self-pay | Admitting: Cardiology

## 2015-11-24 ENCOUNTER — Ambulatory Visit (INDEPENDENT_AMBULATORY_CARE_PROVIDER_SITE_OTHER): Payer: PPO | Admitting: Cardiology

## 2015-11-24 VITALS — BP 116/72 | HR 89 | Ht 63.0 in | Wt 193.0 lb

## 2015-11-24 DIAGNOSIS — E782 Mixed hyperlipidemia: Secondary | ICD-10-CM

## 2015-11-24 DIAGNOSIS — Z01810 Encounter for preprocedural cardiovascular examination: Secondary | ICD-10-CM

## 2015-11-24 DIAGNOSIS — I209 Angina pectoris, unspecified: Secondary | ICD-10-CM | POA: Insufficient documentation

## 2015-11-24 DIAGNOSIS — I447 Left bundle-branch block, unspecified: Secondary | ICD-10-CM

## 2015-11-24 DIAGNOSIS — I2510 Atherosclerotic heart disease of native coronary artery without angina pectoris: Secondary | ICD-10-CM | POA: Diagnosis not present

## 2015-11-24 DIAGNOSIS — Z136 Encounter for screening for cardiovascular disorders: Secondary | ICD-10-CM

## 2015-11-24 HISTORY — DX: Atherosclerotic heart disease of native coronary artery without angina pectoris: I25.10

## 2015-11-24 NOTE — Patient Instructions (Signed)
Your physician recommends that you schedule a follow-up appointment in: not needed     Thank you for choosing Katelyn [name] !

## 2015-11-24 NOTE — Progress Notes (Signed)
Cardiology Office Note  Date: 11/24/2015   ID: CALIOPE VICENCIO, DOB 11-17-1955, MRN [id number]  PCP: Teressa Lower, MD  Requesting provider: Sherley Bounds, MD Consulting Cardiologist: Rozann Lesches, MD    Chief Complaint  Patient presents with  . Preoperative evaluation    History of Present Illness: Katelyn [name] is a 60 y.o. female referred for cardiology consultation by Dr. Sherley Bounds with [facility]. She is being considered for L2-L3 extraforaminal microdiscectomy 2 days from now. Records indicate that she was seen for a preanesthesia evaluation and cardiology consultation was recommended. The patient follows with Dr. Shirlee More with [facility] and has a history of nonobstructive CAD by cardiac catheterization 2014 in [facility] as outlined below.  She presents today stating that she has been plagued with lower back pain and leg pain since December 2016. She has a lumbar disc abnormality as well as sciatic pain. Otherwise, she does not endorse any angina symptoms. She states that she has nitroglycerin but has never had to use it. She also reports NYHA class II dyspnea with typical activities. She describes activities exceeding 4 METs without angina.  I personally reviewed the prior tracing from 10/20/2014 which showed sinus rhythm with left bundle branch block. She had a repeat tracing done today in the office which shows sinus rhythm with left bundle-branch block.  She does have a systolic murmur on examination, but echocardiogram done last year reported no major valvular abnormalities, results summarized below.  Past Medical History  Diagnosis Date  . Nephrolithiasis   . Type 2 diabetes mellitus ([place])   . Essential hypertension   . Hypercholesterolemia   . Left bundle branch block   . Sleep apnea   . H/O hiatal hernia   . GERD (gastroesophageal reflux disease)   . Restless leg syndrome   . Bursitis of right hip   . Lumbar stenosis   . Depression   .  Anxiety   . Arthritis   . Anemia   . CAD (coronary artery disease)     Cardiac catheterization June 2014 in [facility] - 50% circumflex stenosis    Past Surgical History  Procedure Laterality Date  . Right shoulder surgery  2010  . Appendectomy  Age 63  . Abdominal hysterectomy  1983  . Cholecystectomy  1990's  . Left knee surgery x 2  1996  . Kidney stone surgery  2008  . Back surgery      FIRST LUMBAR FUSION/ SURGERY APRIL 2012 AND FUSION WITH INSTRUMENTATION SEPT 2012  . Cysto extraction kidney stones    . Excision/release bursa hip  02/02/2012    Procedure: EXCISION/RELEASE BURSA HIP;  Surgeon: Tobi Bastos, MD;  Location: [facility];  Service: Orthopedics;  Laterality: Right;  Right Hip Bursectomy  . Eye surgery    . Carpal tunnel release      bil    Current Outpatient Prescriptions  Medication Sig Dispense Refill  . aspirin 81 MG tablet Take 81 mg by mouth at bedtime.     . dicyclomine (BENTYL) 10 MG capsule Take 10 mg by mouth 2 (two) times daily.    . DULoxetine (CYMBALTA) 60 MG capsule Take 60 mg by mouth every evening. Total of 90 mg    . famotidine (PEPCID) 20 MG tablet Take 20 mg by mouth 3 (three) times daily.    . furosemide (LASIX) 20 MG tablet Take 20 mg by mouth daily.    . insulin aspart (NOVOLOG) 100 UNIT/ML injection  Inject 12.5-25 Units into the skin daily before breakfast.    . insulin detemir (LEVEMIR) 100 UNIT/ML injection Inject 25-50 Units into the skin at bedtime.    . iron polysaccharides (NIFEREX) 150 MG capsule Take 150 mg by mouth 2 (two) times daily.    Marland Kitchen lisinopril-hydrochlorothiazide (PRINZIDE,ZESTORETIC) 20-12.5 MG per tablet Take 1 tablet by mouth daily after breakfast.    . metFORMIN (GLUCOPHAGE) 1000 MG tablet Take 1,000 mg by mouth 2 (two) times daily with a meal.    . mirtazapine (REMERON) 15 MG tablet Take 15 mg by mouth at bedtime.    . Oxycodone HCl 10 MG TABS Take 10 mg by mouth every 6 (six) hours as needed (for pain).   0  .  pramipexole (MIRAPEX) 0.25 MG tablet Take 0.5 mg by mouth every evening.      No current facility-administered medications for this visit.   Allergies:  Atorvastatin; Other; Pentazocine lactate; and Talwin   Social History: The patient  reports that she has never smoked. She has never used smokeless tobacco. She reports that she drinks alcohol. She reports that she does not use illicit drugs.   Family History: The patient's family history includes Asthma in her sister; Bone cancer in her sister; Heart failure in her mother; Hypertension in her father; Lung cancer in her father; Stroke in her father.   ROS:  Please see the history of present illness. Otherwise, complete review of systems is positive for back pain and right leg pain.  All other systems are reviewed and negative.   Physical Exam: VS:  BP 116/72 mmHg  Pulse 89  Ht 5\' 3"  (1.6 m)  Wt 193 lb (87.544 kg)  BMI 34.20 kg/m2  SpO2 96%, BMI Body mass index is 34.2 kg/(m^2).  Wt Readings from Last 3 Encounters:  11/24/15 193 lb (87.544 kg)  11/18/15 193 lb 4.8 oz (87.68 kg)  02/02/12 171 lb 15.3 oz (78 kg)    General: Overweight woman, appears comfortable at rest. HEENT: Conjunctiva and lids normal, oropharynx clear with moist mucosa. Neck: Supple, no elevated JVP or carotid bruits, no thyromegaly. Lungs: Clear to auscultation, nonlabored breathing at rest. Cardiac: Regular rate and rhythm, no S3, 2/6 systolic murmur, no pericardial rub. Abdomen: Soft, nontender, bowel sounds present, no guarding or rebound. Extremities: No pitting edema, distal pulses 2+. Skin: Warm and dry. Musculoskeletal: No kyphosis. Neuropsychiatric: Alert and oriented x3, affect grossly appropriate.  ECG: ECG is ordered today.  Recent Labwork: 11/18/2015: BUN 28*; Creatinine, Ser 1.49*; Hemoglobin 11.9*; Platelets 322; Potassium 4.4; Sodium 141   Other Studies Reviewed Today:  Echocardiogram 11/[facility] Mild LVH with LVEF 50-55%, grade  1 diastolic dysfunction, mild left atrial enlargement, normal RV contraction, no significant valvular abnormalities, normal aortic root size, no pericardial effusion.  Assessment and Plan:  1. Preoperative evaluation in a 60 year old woman with history of nonobstructive CAD documented at cardiac catheterization within the last 3 years, no active angina symptoms or dyspnea on exertion beyond NYHA class II with activities exceeding 4 METs, chronic left bundle branch block on ECG, hypertension, and type 2 diabetes mellitus. She has a systolic murmur on examination but no significant valvular abnormalities by echocardiogram done last year. At this point do not anticipate further cardiac testing prior to proceeding with planned lumbar microdiscectomy. She should be able to proceed at an acceptable perioperative cardiac risk, generally in the low to intermediate range.  2. History of nonobstructive CAD, 50% circumflex stenosis as of June 2014. No active  angina. She is on aspirin, Prinzide, and has as needed nitroglycerin. Would keep follow-up with Dr. Bettina Gavia for ongoing cardiovascular care.  3. Reported history of hyperlipidemia and statin intolerance. I do not have lipid numbers for review at this time. She needs to keep follow-up with PCP and Dr. Bettina Gavia for ongoing management.  4. Left bundle branch block by ECG, old.  Current medicines were reviewed with the patient today.   Orders Placed This Encounter  Procedures  . EKG 12-Lead    Disposition: FU with Dr. Bettina Gavia for ongoing cardiac care.   Signed, Satira Sark, MD, [facility] 11/24/2015 3:28 PM    [place] [facility] [address] [place], [place] [address] Phone: [phone]; Fax: [phone]

## 2015-11-25 MED ORDER — DEXAMETHASONE SODIUM PHOSPHATE 10 MG/ML IJ SOLN
10.0000 mg | INTRAMUSCULAR | Status: DC
  Filled 2015-11-25: qty 1

## 2015-11-25 MED ORDER — CEFAZOLIN SODIUM-DEXTROSE 2-3 GM-% IV SOLR
2.0000 g | INTRAVENOUS | Status: AC
  Administered 2015-11-26: 2 g via INTRAVENOUS

## 2015-11-26 ENCOUNTER — Ambulatory Visit (HOSPITAL_COMMUNITY)
Admission: RE | Admit: 2015-11-26 | Discharge: 2015-11-27 | Disposition: A | Discharge status: Home / Self Care | Payer: PPO | Source: Ambulatory Visit | Attending: Neurological Surgery | Admitting: Neurological Surgery

## 2015-11-26 ENCOUNTER — Inpatient Hospital Stay (HOSPITAL_COMMUNITY): Payer: PPO | Admitting: Emergency Medicine

## 2015-11-26 ENCOUNTER — Encounter (HOSPITAL_COMMUNITY): Payer: Self-pay | Admitting: *Deleted

## 2015-11-26 ENCOUNTER — Inpatient Hospital Stay (HOSPITAL_COMMUNITY): Payer: PPO

## 2015-11-26 ENCOUNTER — Inpatient Hospital Stay (HOSPITAL_COMMUNITY): Payer: PPO | Admitting: Anesthesiology

## 2015-11-26 ENCOUNTER — Encounter (HOSPITAL_COMMUNITY)
Admission: RE | Disposition: A | Discharge status: Home / Self Care | Payer: Self-pay | Source: Ambulatory Visit | Attending: Neurological Surgery

## 2015-11-26 DIAGNOSIS — Z9889 Other specified postprocedural states: Secondary | ICD-10-CM | POA: Insufficient documentation

## 2015-11-26 DIAGNOSIS — I10 Essential (primary) hypertension: Secondary | ICD-10-CM | POA: Diagnosis not present

## 2015-11-26 DIAGNOSIS — M4806 Spinal stenosis, lumbar region: Secondary | ICD-10-CM | POA: Diagnosis not present

## 2015-11-26 DIAGNOSIS — I447 Left bundle-branch block, unspecified: Secondary | ICD-10-CM | POA: Insufficient documentation

## 2015-11-26 DIAGNOSIS — K449 Diaphragmatic hernia without obstruction or gangrene: Secondary | ICD-10-CM | POA: Insufficient documentation

## 2015-11-26 DIAGNOSIS — M1990 Unspecified osteoarthritis, unspecified site: Secondary | ICD-10-CM | POA: Diagnosis not present

## 2015-11-26 DIAGNOSIS — Z9049 Acquired absence of other specified parts of digestive tract: Secondary | ICD-10-CM | POA: Diagnosis not present

## 2015-11-26 DIAGNOSIS — M5116 Intervertebral disc disorders with radiculopathy, lumbar region: Secondary | ICD-10-CM | POA: Diagnosis not present

## 2015-11-26 DIAGNOSIS — E118 Type 2 diabetes mellitus with unspecified complications: Secondary | ICD-10-CM | POA: Insufficient documentation

## 2015-11-26 DIAGNOSIS — Z7982 Long term (current) use of aspirin: Secondary | ICD-10-CM | POA: Diagnosis not present

## 2015-11-26 DIAGNOSIS — E7800 Pure hypercholesterolemia, unspecified: Secondary | ICD-10-CM | POA: Insufficient documentation

## 2015-11-26 DIAGNOSIS — Z419 Encounter for procedure for purposes other than remedying health state, unspecified: Secondary | ICD-10-CM

## 2015-11-26 DIAGNOSIS — Z79899 Other long term (current) drug therapy: Secondary | ICD-10-CM | POA: Diagnosis not present

## 2015-11-26 DIAGNOSIS — G4733 Obstructive sleep apnea (adult) (pediatric): Secondary | ICD-10-CM | POA: Diagnosis not present

## 2015-11-26 DIAGNOSIS — M5126 Other intervertebral disc displacement, lumbar region: Secondary | ICD-10-CM | POA: Diagnosis not present

## 2015-11-26 DIAGNOSIS — Z794 Long term (current) use of insulin: Secondary | ICD-10-CM | POA: Diagnosis not present

## 2015-11-26 DIAGNOSIS — G2581 Restless legs syndrome: Secondary | ICD-10-CM | POA: Insufficient documentation

## 2015-11-26 DIAGNOSIS — M4326 Fusion of spine, lumbar region: Secondary | ICD-10-CM | POA: Diagnosis not present

## 2015-11-26 DIAGNOSIS — I2510 Atherosclerotic heart disease of native coronary artery without angina pectoris: Secondary | ICD-10-CM | POA: Diagnosis not present

## 2015-11-26 DIAGNOSIS — K219 Gastro-esophageal reflux disease without esophagitis: Secondary | ICD-10-CM | POA: Insufficient documentation

## 2015-11-26 DIAGNOSIS — D649 Anemia, unspecified: Secondary | ICD-10-CM | POA: Diagnosis not present

## 2015-11-26 HISTORY — DX: Other complications of anesthesia, initial encounter: T88.59XA

## 2015-11-26 HISTORY — PX: LUMBAR LAMINECTOMY/DECOMPRESSION MICRODISCECTOMY: SHX5026

## 2015-11-26 HISTORY — DX: Other specified postprocedural states: Z98.890

## 2015-11-26 HISTORY — DX: Adverse effect of unspecified anesthetic, initial encounter: T41.45XA

## 2015-11-26 LAB — GLUCOSE, CAPILLARY
GLUCOSE-CAPILLARY: 126 mg/dL — AB (ref 65–99)
GLUCOSE-CAPILLARY: 146 mg/dL — AB (ref 65–99)
GLUCOSE-CAPILLARY: 202 mg/dL — AB (ref 65–99)
GLUCOSE-CAPILLARY: 193 mg/dL — AB (ref 65–99)

## 2015-11-26 SURGERY — LUMBAR LAMINECTOMY/DECOMPRESSION MICRODISCECTOMY 1 LEVEL
Anesthesia: General | Site: Spine Lumbar | Laterality: Right

## 2015-11-26 MED ORDER — PROMETHAZINE HCL 25 MG/ML IJ SOLN
INTRAMUSCULAR | Status: AC
Start: 2015-11-26 — End: 2015-11-27
  Filled 2015-11-26: qty 1

## 2015-11-26 MED ORDER — ROCURONIUM BROMIDE 100 MG/10ML IV SOLN
INTRAVENOUS | Status: DC | PRN
  Administered 2015-11-26: 10 mg via INTRAVENOUS
  Administered 2015-11-26: 40 mg via INTRAVENOUS

## 2015-11-26 MED ORDER — MORPHINE SULFATE (PF) 2 MG/ML IV SOLN
1.0000 mg | INTRAVENOUS | Status: DC | PRN
  Administered 2015-11-26 (×2): 2 mg via INTRAVENOUS
  Filled 2015-11-26 (×2): qty 1

## 2015-11-26 MED ORDER — FAMOTIDINE 20 MG PO TABS
20.0000 mg | ORAL_TABLET | Freq: Three times a day (TID) | ORAL | Status: DC
  Administered 2015-11-26: 20 mg via ORAL
  Filled 2015-11-26: qty 1

## 2015-11-26 MED ORDER — SODIUM CHLORIDE 0.9% FLUSH
3.0000 mL | Freq: Two times a day (BID) | INTRAVENOUS | Status: DC
  Administered 2015-11-26 (×2): 3 mL via INTRAVENOUS

## 2015-11-26 MED ORDER — OXYCODONE HCL 5 MG/5ML PO SOLN
5.0000 mg | Freq: Once | ORAL | Status: AC | PRN
  Administered 2015-11-26: 5 mg via ORAL

## 2015-11-26 MED ORDER — METHOCARBAMOL 500 MG PO TABS
500.0000 mg | ORAL_TABLET | Freq: Four times a day (QID) | ORAL | Status: DC | PRN
  Administered 2015-11-26 – 2015-11-27 (×3): 500 mg via ORAL
  Filled 2015-11-26 (×3): qty 1

## 2015-11-26 MED ORDER — ONDANSETRON HCL 4 MG/2ML IJ SOLN
4.0000 mg | INTRAMUSCULAR | Status: DC | PRN
Start: 2015-11-26 — End: 2015-11-27

## 2015-11-26 MED ORDER — SODIUM CHLORIDE 0.9% FLUSH: 3.0000 mL | INTRAVENOUS | Status: DC | PRN

## 2015-11-26 MED ORDER — METFORMIN HCL 500 MG PO TABS
1000.0000 mg | ORAL_TABLET | Freq: Two times a day (BID) | ORAL | Status: DC
  Administered 2015-11-26 – 2015-11-27 (×2): 1000 mg via ORAL
  Filled 2015-11-26 (×2): qty 2

## 2015-11-26 MED ORDER — HYDROMORPHONE HCL 1 MG/ML IJ SOLN
INTRAMUSCULAR | Status: AC
  Filled 2015-11-26: qty 1

## 2015-11-26 MED ORDER — POTASSIUM CHLORIDE IN NACL 20-0.9 MEQ/L-% IV SOLN
INTRAVENOUS | Status: DC
Start: 2015-11-26 — End: 2015-11-27
  Filled 2015-11-26 (×3): qty 1000

## 2015-11-26 MED ORDER — CEFAZOLIN SODIUM 1-5 GM-% IV SOLN
1.0000 g | Freq: Three times a day (TID) | INTRAVENOUS | Status: AC
  Administered 2015-11-26 – 2015-11-27 (×2): 1 g via INTRAVENOUS
  Filled 2015-11-26 (×2): qty 50

## 2015-11-26 MED ORDER — HEMOSTATIC AGENTS (NO CHARGE) OPTIME
TOPICAL | Status: DC | PRN
  Administered 2015-11-26: 1 via TOPICAL

## 2015-11-26 MED ORDER — FENTANYL CITRATE (PF) 250 MCG/5ML IJ SOLN
INTRAMUSCULAR | Status: AC
  Filled 2015-11-26: qty 5

## 2015-11-26 MED ORDER — MIDAZOLAM HCL 5 MG/5ML IJ SOLN
INTRAMUSCULAR | Status: DC | PRN
  Administered 2015-11-26: 2 mg via INTRAVENOUS

## 2015-11-26 MED ORDER — LIDOCAINE HCL (CARDIAC) 20 MG/ML IV SOLN
INTRAVENOUS | Status: DC | PRN
  Administered 2015-11-26: 100 mg via INTRAVENOUS

## 2015-11-26 MED ORDER — DICYCLOMINE HCL 10 MG PO CAPS: 10.0000 mg | ORAL_CAPSULE | Freq: Two times a day (BID) | ORAL | Status: DC

## 2015-11-26 MED ORDER — PHENOL 1.4 % MT LIQD
1.0000 | OROMUCOSAL | Status: DC | PRN
Start: 2015-11-26 — End: 2015-11-27

## 2015-11-26 MED ORDER — PRAMIPEXOLE DIHYDROCHLORIDE 0.25 MG PO TABS
0.5000 mg | ORAL_TABLET | Freq: Every evening | ORAL | Status: DC
  Administered 2015-11-26: 0.5 mg via ORAL
  Filled 2015-11-26 (×2): qty 2

## 2015-11-26 MED ORDER — GLYCOPYRROLATE 0.2 MG/ML IJ SOLN
INTRAMUSCULAR | Status: DC | PRN
  Administered 2015-11-26: 0.6 mg via INTRAVENOUS

## 2015-11-26 MED ORDER — ONDANSETRON HCL 4 MG/2ML IJ SOLN
INTRAMUSCULAR | Status: DC | PRN
  Administered 2015-11-26: 4 mg via INTRAVENOUS

## 2015-11-26 MED ORDER — MENTHOL 3 MG MT LOZG
1.0000 | LOZENGE | OROMUCOSAL | Status: DC | PRN
  Filled 2015-11-26: qty 9

## 2015-11-26 MED ORDER — LACTATED RINGERS IV SOLN
INTRAVENOUS | Status: DC | PRN
  Administered 2015-11-26 (×2): via INTRAVENOUS

## 2015-11-26 MED ORDER — METHYLPREDNISOLONE ACETATE 80 MG/ML IJ SUSP
INTRAMUSCULAR | Status: DC | PRN
  Administered 2015-11-26: 80 mg

## 2015-11-26 MED ORDER — PROPOFOL 10 MG/ML IV BOLUS
INTRAVENOUS | Status: DC | PRN
  Administered 2015-11-26: 100 mg via INTRAVENOUS
  Administered 2015-11-26: 50 mg via INTRAVENOUS

## 2015-11-26 MED ORDER — FENTANYL CITRATE (PF) 100 MCG/2ML IJ SOLN
INTRAMUSCULAR | Status: DC | PRN
  Administered 2015-11-26: 50 ug via INTRAVENOUS
  Administered 2015-11-26: 100 ug via INTRAVENOUS
  Administered 2015-11-26 (×2): 50 ug via INTRAVENOUS

## 2015-11-26 MED ORDER — PROPOFOL 10 MG/ML IV BOLUS
INTRAVENOUS | Status: AC
  Filled 2015-11-26: qty 20

## 2015-11-26 MED ORDER — DIPHENHYDRAMINE HCL 50 MG/ML IJ SOLN
INTRAMUSCULAR | Status: DC | PRN
  Administered 2015-11-26: 25 mg via INTRAVENOUS

## 2015-11-26 MED ORDER — MIRTAZAPINE 15 MG PO TABS
15.0000 mg | ORAL_TABLET | Freq: Every day | ORAL | Status: DC
  Administered 2015-11-26: 15 mg via ORAL
  Filled 2015-11-26 (×2): qty 1

## 2015-11-26 MED ORDER — FUROSEMIDE 20 MG PO TABS: 20.0000 mg | ORAL_TABLET | Freq: Every day | ORAL | Status: DC

## 2015-11-26 MED ORDER — FENTANYL CITRATE (PF) 100 MCG/2ML IJ SOLN
INTRAMUSCULAR | Status: DC | PRN
  Administered 2015-11-26: 100 ug via INTRAVENOUS

## 2015-11-26 MED ORDER — ACETAMINOPHEN 650 MG RE SUPP: 650.0000 mg | RECTAL | Status: DC | PRN

## 2015-11-26 MED ORDER — DULOXETINE HCL 30 MG PO CPEP
60.0000 mg | ORAL_CAPSULE | Freq: Every evening | ORAL | Status: DC
  Administered 2015-11-26: 60 mg via ORAL
  Filled 2015-11-26: qty 2

## 2015-11-26 MED ORDER — LISINOPRIL 20 MG PO TABS: 20.0000 mg | ORAL_TABLET | Freq: Every day | ORAL | Status: DC

## 2015-11-26 MED ORDER — ASPIRIN EC 81 MG PO TBEC
81.0000 mg | DELAYED_RELEASE_TABLET | Freq: Every day | ORAL | Status: DC
  Administered 2015-11-26: 81 mg via ORAL
  Filled 2015-11-26: qty 1

## 2015-11-26 MED ORDER — MIDAZOLAM HCL 2 MG/2ML IJ SOLN
INTRAMUSCULAR | Status: AC
  Filled 2015-11-26: qty 2

## 2015-11-26 MED ORDER — LISINOPRIL-HYDROCHLOROTHIAZIDE 20-12.5 MG PO TABS: 1.0000 | ORAL_TABLET | Freq: Every day | ORAL | Status: DC

## 2015-11-26 MED ORDER — OXYCODONE HCL 5 MG PO TABS: 10.0000 mg | ORAL_TABLET | Freq: Four times a day (QID) | ORAL | Status: DC | PRN

## 2015-11-26 MED ORDER — THROMBIN 5000 UNITS EX SOLR
CUTANEOUS | Status: DC | PRN
  Administered 2015-11-26 (×2): 5000 [IU] via TOPICAL

## 2015-11-26 MED ORDER — METHOCARBAMOL 1000 MG/10ML IJ SOLN: 500.0000 mg | Freq: Four times a day (QID) | INTRAVENOUS | Status: DC | PRN

## 2015-11-26 MED ORDER — OXYCODONE HCL 5 MG PO TABS: 15.0000 mg | ORAL_TABLET | Freq: Once | ORAL | Status: AC | PRN

## 2015-11-26 MED ORDER — ACETAMINOPHEN 325 MG PO TABS: 650.0000 mg | ORAL_TABLET | ORAL | Status: DC | PRN

## 2015-11-26 MED ORDER — OXYCODONE HCL 5 MG PO TABS
10.0000 mg | ORAL_TABLET | ORAL | Status: DC | PRN
  Administered 2015-11-26 – 2015-11-27 (×3): 10 mg via ORAL
  Filled 2015-11-26 (×3): qty 2

## 2015-11-26 MED ORDER — 0.9 % SODIUM CHLORIDE (POUR BTL) OPTIME
TOPICAL | Status: DC | PRN
  Administered 2015-11-26: 1000 mL

## 2015-11-26 MED ORDER — SODIUM CHLORIDE 0.9 % IR SOLN
Status: DC | PRN
  Administered 2015-11-26: 500 mL

## 2015-11-26 MED ORDER — LACTATED RINGERS IV SOLN
INTRAVENOUS | Status: DC
  Administered 2015-11-26: 09:00:00 via INTRAVENOUS

## 2015-11-26 MED ORDER — GELATIN ABSORBABLE MT POWD
OROMUCOSAL | Status: DC | PRN
  Administered 2015-11-26: 5 mL via TOPICAL

## 2015-11-26 MED ORDER — OXYCODONE HCL 5 MG PO TABS
ORAL_TABLET | ORAL | Status: AC
  Filled 2015-11-26: qty 1

## 2015-11-26 MED ORDER — NEOSTIGMINE METHYLSULFATE 10 MG/10ML IV SOLN
INTRAVENOUS | Status: DC | PRN
  Administered 2015-11-26: 4 mg via INTRAVENOUS

## 2015-11-26 MED ORDER — HYDROMORPHONE HCL 1 MG/ML IJ SOLN
0.2500 mg | INTRAMUSCULAR | Status: DC | PRN
  Administered 2015-11-26 (×3): 0.5 mg via INTRAVENOUS

## 2015-11-26 MED ORDER — FENTANYL CITRATE (PF) 100 MCG/2ML IJ SOLN
INTRAMUSCULAR | Status: AC
  Filled 2015-11-26: qty 2

## 2015-11-26 MED ORDER — PROMETHAZINE HCL 25 MG/ML IJ SOLN
6.2500 mg | INTRAMUSCULAR | Status: DC | PRN
Start: 2015-11-26 — End: 2015-11-26
  Administered 2015-11-26: 6.25 mg via INTRAVENOUS

## 2015-11-26 MED ORDER — POLYSACCHARIDE IRON COMPLEX 150 MG PO CAPS
150.0000 mg | ORAL_CAPSULE | Freq: Two times a day (BID) | ORAL | Status: DC
  Administered 2015-11-26: 150 mg via ORAL
  Filled 2015-11-26 (×4): qty 1

## 2015-11-26 MED ORDER — HYDROCHLOROTHIAZIDE 12.5 MG PO CAPS: 12.5000 mg | ORAL_CAPSULE | Freq: Every day | ORAL | Status: DC

## 2015-11-26 SURGICAL SUPPLY — 40 items
BAG DECANTER FOR FLEXI CONT (MISCELLANEOUS) ×2 IMPLANT
BENZOIN TINCTURE PRP APPL 2/3 (GAUZE/BANDAGES/DRESSINGS) ×2 IMPLANT
BUR MATCHSTICK NEURO 3.0 LAGG (BURR) ×2 IMPLANT
CANISTER SUCT 3000ML PPV (MISCELLANEOUS) ×2 IMPLANT
DRAPE LAPAROTOMY 100X72X124 (DRAPES) ×2 IMPLANT
DRAPE MICROSCOPE LEICA (MISCELLANEOUS) ×2 IMPLANT
DRAPE POUCH INSTRU U-SHP 10X18 (DRAPES) ×2 IMPLANT
DRAPE SURG 17X23 STRL (DRAPES) ×2 IMPLANT
DRSG OPSITE POSTOP 4X6 (GAUZE/BANDAGES/DRESSINGS) ×2 IMPLANT
DURAPREP 26ML APPLICATOR (WOUND CARE) ×2 IMPLANT
ELECT REM PT RETURN 9FT ADLT (ELECTROSURGICAL) ×2
ELECTRODE REM PT RTRN 9FT ADLT (ELECTROSURGICAL) ×1 IMPLANT
GAUZE SPONGE 4X4 16PLY XRAY LF (GAUZE/BANDAGES/DRESSINGS) IMPLANT
GLOVE BIO SURGEON STRL SZ8 (GLOVE) ×2 IMPLANT
GLOVE BIOGEL PI IND STRL 7.0 (GLOVE) ×2 IMPLANT
GLOVE BIOGEL PI INDICATOR 7.0 (GLOVE) ×2
GLOVE SURG SS PI 6.5 STRL IVOR (GLOVE) ×4 IMPLANT
GOWN STRL REUS W/ TWL LRG LVL3 (GOWN DISPOSABLE) ×1 IMPLANT
GOWN STRL REUS W/ TWL XL LVL3 (GOWN DISPOSABLE) ×1 IMPLANT
GOWN STRL REUS W/TWL 2XL LVL3 (GOWN DISPOSABLE) IMPLANT
GOWN STRL REUS W/TWL LRG LVL3 (GOWN DISPOSABLE) ×1
GOWN STRL REUS W/TWL XL LVL3 (GOWN DISPOSABLE) ×1
HEMOSTAT POWDER KIT SURGIFOAM (HEMOSTASIS) ×2 IMPLANT
KIT BASIN OR (CUSTOM PROCEDURE TRAY) ×2 IMPLANT
KIT ROOM TURNOVER OR (KITS) ×2 IMPLANT
NEEDLE HYPO 25X1 1.5 SAFETY (NEEDLE) ×2 IMPLANT
NEEDLE SPNL 20GX3.5 QUINCKE YW (NEEDLE) ×2 IMPLANT
NS IRRIG 1000ML POUR BTL (IV SOLUTION) ×2 IMPLANT
PACK LAMINECTOMY NEURO (CUSTOM PROCEDURE TRAY) ×2 IMPLANT
PAD ARMBOARD 7.5X6 YLW CONV (MISCELLANEOUS) ×10 IMPLANT
RUBBERBAND STERILE (MISCELLANEOUS) ×4 IMPLANT
SPONGE SURGIFOAM ABS GEL SZ50 (HEMOSTASIS) ×2 IMPLANT
STRIP CLOSURE SKIN 1/2X4 (GAUZE/BANDAGES/DRESSINGS) ×2 IMPLANT
SUT VIC AB 0 CT1 18XCR BRD8 (SUTURE) ×1 IMPLANT
SUT VIC AB 0 CT1 8-18 (SUTURE) ×1
SUT VIC AB 2-0 CP2 18 (SUTURE) ×2 IMPLANT
SUT VIC AB 3-0 SH 8-18 (SUTURE) ×2 IMPLANT
TOWEL OR 17X24 6PK STRL BLUE (TOWEL DISPOSABLE) ×2 IMPLANT
TOWEL OR 17X26 10 PK STRL BLUE (TOWEL DISPOSABLE) ×2 IMPLANT
WATER STERILE IRR 1000ML POUR (IV SOLUTION) ×2 IMPLANT

## 2015-11-26 NOTE — Anesthesia Procedure Notes (Signed)
Procedure Name: Intubation Date/Time: 11/26/2015 10:29 AM Performed by: Maude Leriche D Pre-anesthesia Checklist: Patient identified, Emergency Drugs available, Suction available, Patient being monitored and Timeout performed Patient Re-evaluated:Patient Re-evaluated prior to inductionOxygen Delivery Method: Circle system utilized Preoxygenation: Pre-oxygenation with 100% oxygen Intubation Type: IV induction Ventilation: Mask ventilation without difficulty Laryngoscope Size: Miller and 2 Grade View: Grade II Tube type: Oral Tube size: 7.5 mm Number of attempts: 1 Airway Equipment and Method: Stylet Placement Confirmation: ETT inserted through vocal cords under direct vision,  positive ETCO2 and breath sounds checked- equal and bilateral Secured at: 21 cm Tube secured with: Tape Dental Injury: Teeth and Oropharynx as per pre-operative assessment

## 2015-11-26 NOTE — Transfer of Care (Signed)
Immediate Anesthesia Transfer of Care Note  Patient: Katelyn [name]  Procedure(s) Performed: Procedure(s) with comments: Extraforaminal Microdiscectomy  - Lumbar two-three- right (Right) - right   Patient Location: PACU  Anesthesia Type:General  Level of Consciousness: awake  Airway & Oxygen Therapy: Patient Spontanous Breathing and Patient connected to face mask oxygen  Post-op Assessment: Report given to RN and Post -op Vital signs reviewed and stable  Post vital signs: Reviewed and stable  Last Vitals:  Filed Vitals:   11/26/15 0750  BP: 96/53  Pulse: 72  Temp: 36.7 C  Resp: 20    Complications: No apparent anesthesia complications

## 2015-11-26 NOTE — Anesthesia Preprocedure Evaluation (Addendum)
Anesthesia Evaluation  Patient identified by MRN, date of birth, ID band Patient awake    Reviewed: Allergy & Precautions, H&P , NPO status , Patient's Chart, lab work & pertinent test results, reviewed documented beta blocker date and time   History of Anesthesia Complications (+) PONV and AWARENESS UNDER ANESTHESIA  Airway Mallampati: II  TM Distance: >3 FB Neck ROM: Full    Dental  (+) Dental Advisory Given   Pulmonary sleep apnea ,  Mild OSA, no Rx   breath sounds clear to auscultation       Cardiovascular hypertension, Pt. on medications + CAD  II Rhythm:Regular Rate:Normal  Heart cath 2009, non obstructive CAD  Echo 2016 with perserved EF 55%, no significant valvular abnormalities  Cleared by cardiology, METS >4   Neuro/Psych Anxiety Depression negative neurological ROS     GI/Hepatic Neg liver ROS, hiatal hernia, GERD  ,  Endo/Other  diabetes, Type 2, Oral Hypoglycemic AgentsFair control  Renal/GU CRFRenal disease  negative genitourinary   Musculoskeletal  (+) Arthritis ,   Abdominal   Peds negative pediatric ROS (+)  Hematology negative hematology ROS (+)   Anesthesia Other Findings Lumbar stenosis  Reproductive/Obstetrics negative OB ROS                            Anesthesia Physical  Anesthesia Plan  ASA: III  Anesthesia Plan: General   Post-op Pain Management:    Induction: Intravenous  Airway Management Planned: LMA  Additional Equipment:   Intra-op Plan:   Post-operative Plan: Extubation in OR  Informed Consent:   Dental advisory given  Plan Discussed with: CRNA and Surgeon  Anesthesia Plan Comments:         Anesthesia Quick Evaluation

## 2015-11-26 NOTE — Op Note (Signed)
11/26/2015  11:41 AM  PATIENT:  Katelyn [name]  60 y.o. 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 MRI showed large R L2-3 extraforaminal HNP with foraminal stenosis. Pt tried medical management without relief. I recommended a right L2-3 extraforaminal microdiscectomy. Patient understood the risks, benefits, and alternatives and potential outcomes and wished to proceed.  PROCEDURE DETAILS: The patient was taken to the operating room and after induction of adequate generalized endotracheal anesthesia, the patient was rolled into the prone position on the Wilson frame and all pressure points were padded. The lumbar region was cleaned and then prepped with DuraPrep and draped in the usual sterile fashion. 5 cc of local anesthesia was injected and then a dorsal midline incision was made and carried down to the lumbo sacral fascia. The fascia was opened and the paraspinous musculature was taken down in a subperiosteal fashion to expose L2-3  on the right. Intraoperative x-ray confirmed my level, and then I used a combination of the high-speed drill and the Kerrison punches to perform drill the lateral aprs and superior facet an  foraminotomy at L2-3  on the right. The underlying yellow ligament was opened and removed in a piecemeal fashion to expose the underlying dura and exiting nerve root. I undercut the lateral recess and dissected down until I was medial to and distal to the pedicle. The nerve root was well decompressed. We then gently retracted the nerve root with a  retractor, coagulated the epidural venous vasculature, and incised the disc space. I performed a thorough intradiscal discectomy with pituitary rongeurs and curettes, until I had a nice decompression of the nerve root. I then palpated with a coronary dilator along the nerve root and into the foramen to assure adequate decompression. I felt no more compression of the nerve root. I irrigated with saline solution containing bacitracin. Achieved hemostasis with bipolar cautery, lined the dura with Gelfoam, and then closed the fascia with 0 Vicryl. I closed the subcutaneous tissues with 2-0 Vicryl and the subcuticular tissues with 3-0 Vicryl. The skin was then closed with benzoin and Steri-Strips. The drapes were removed, a sterile dressing was applied. The patient was awakened from general anesthesia and transferred to the recovery room in stable condition. At the end of the procedure all sponge, needle and instrument counts were correct.   PLAN OF CARE: Admit for overnight observation  PATIENT DISPOSITION:  PACU - hemodynamically stable.   Delay start of Pharmacological VTE agent (>24hrs) due to surgical blood loss or risk of bleeding:  yes

## 2015-11-26 NOTE — Anesthesia Postprocedure Evaluation (Signed)
Anesthesia Post Note  Patient: Katelyn [name]  Procedure(s) Performed: Procedure(s) (LRB): Extraforaminal Microdiscectomy  - Lumbar two-three- right (Right)  Patient location during evaluation: PACU Anesthesia Type: General Level of consciousness: awake and alert Pain management: pain level controlled Vital Signs Assessment: post-procedure vital signs reviewed and stable Respiratory status: spontaneous breathing, nonlabored ventilation and respiratory function stable Cardiovascular status: blood pressure returned to baseline and stable Postop Assessment: no signs of nausea or vomiting Anesthetic complications: no    Last Vitals:  Filed Vitals:   11/26/15 1250 11/26/15 1312  BP: 121/61 129/58  Pulse: 67 62  Temp: 36.7 C 36.7 C  Resp: 11 16    Last Pain:  Filed Vitals:   11/26/15 1335  PainSc: 6                  Zenaida Deed

## 2015-11-26 NOTE — H&P (Signed)
Subjective: Patient is a 60 y.o. female admitted for HNP. Onset of symptoms was a few weeks ago, rapidly worsening since that time.  The pain is rated intense, and is located at the across the lower back and radiates to RLE. The pain is described as aching and occurs all day. The symptoms have been progressive. Symptoms are exacerbated by exercise. MRI or CT showed HNP L2-3   Past Medical History  Diagnosis Date  . Nephrolithiasis   . Type 2 diabetes mellitus ([place])   . Essential hypertension   . Hypercholesterolemia   . Left bundle branch block   . Sleep apnea   . H/O hiatal hernia   . GERD (gastroesophageal reflux disease)   . Restless leg syndrome   . Bursitis of right hip   . Lumbar stenosis   . Depression   . Anxiety   . Arthritis   . Anemia   . CAD (coronary artery disease)     Cardiac catheterization June 2014 in [facility] - 50% circumflex stenosis  . Complication of anesthesia   . PONV (postoperative nausea and vomiting)     Past Surgical History  Procedure Laterality Date  . Right shoulder surgery  2010  . Appendectomy  Age 89  . Abdominal hysterectomy  1983  . Cholecystectomy  1990's  . Left knee surgery x 2  1996  . Kidney stone surgery  2008  . Back surgery      FIRST LUMBAR FUSION/ SURGERY APRIL 2012 AND FUSION WITH INSTRUMENTATION SEPT 2012  . Cysto extraction kidney stones    . Excision/release bursa hip  02/02/2012    Procedure: EXCISION/RELEASE BURSA HIP;  Surgeon: Tobi Bastos, MD;  Location: [facility];  Service: Orthopedics;  Laterality: Right;  Right Hip Bursectomy  . Eye surgery    . Carpal tunnel release      bil    Prior to Admission medications   Medication Sig Start Date End Date Taking? Authorizing Provider  aspirin 81 MG tablet Take 81 mg by mouth at bedtime.    Yes Historical Provider, MD  DULoxetine (CYMBALTA) 60 MG capsule Take 60 mg by mouth every evening. Total of 90 mg   Yes Historical Provider, MD  famotidine (PEPCID) 20 MG tablet Take  20 mg by mouth 3 (three) times daily.   Yes Historical Provider, MD  furosemide (LASIX) 20 MG tablet Take 20 mg by mouth daily.   Yes Historical Provider, MD  insulin aspart (NOVOLOG) 100 UNIT/ML injection Inject 12.5-25 Units into the skin daily before breakfast.   Yes Historical Provider, MD  insulin detemir (LEVEMIR) 100 UNIT/ML injection Inject 25-50 Units into the skin at bedtime.   Yes Historical Provider, MD  iron polysaccharides (NIFEREX) 150 MG capsule Take 150 mg by mouth 2 (two) times daily.   Yes Historical Provider, MD  lisinopril-hydrochlorothiazide (PRINZIDE,ZESTORETIC) 20-12.5 MG per tablet Take 1 tablet by mouth daily after breakfast.   Yes Historical Provider, MD  metFORMIN (GLUCOPHAGE) 1000 MG tablet Take 1,000 mg by mouth 2 (two) times daily with a meal.   Yes Historical Provider, MD  mirtazapine (REMERON) 15 MG tablet Take 15 mg by mouth at bedtime.   Yes Historical Provider, MD  Oxycodone HCl 10 MG TABS Take 10 mg by mouth every 6 (six) hours as needed (for pain).  10/15/15  Yes Historical Provider, MD  pramipexole (MIRAPEX) 0.25 MG tablet Take 0.5 mg by mouth every evening.    Yes Historical Provider, MD  dicyclomine (BENTYL) 10  MG capsule Take 10 mg by mouth 2 (two) times daily.    Historical Provider, MD   Allergies  Allergen Reactions  . Atorvastatin Nausea And Vomiting    Other reaction(s): Myalgias (intolerance)  . Other Nausea Only    Anesthesia, not sure which type  . Pentazocine Lactate Other (See Comments)    Headache   . Talwin [Pentazocine] Other (See Comments)    headache    Social History  Substance Use Topics  . Smoking status: Never Smoker   . Smokeless tobacco: Never Used  . Alcohol Use: 0.0 oz/week    0 Standard drinks or equivalent per week     Comment: Rarely    Family History  Problem Relation Age of Onset  . Lung cancer Father   . Heart failure Mother   . Bone cancer Sister   . Asthma Sister   . Hypertension Father   . Stroke Father       Review of Systems  Positive ROS: neg  All other systems have been reviewed and were otherwise negative with the exception of those mentioned in the HPI and as above.  Objective: Vital signs in last 24 hours: Temp:  [98.1 F (36.7 C)] 98.1 F (36.7 C) (02/17 0750) Pulse Rate:  [72] 72 (02/17 0750) Resp:  [20] 20 (02/17 0750) BP: (96)/(53) 96/53 mmHg (02/17 0750) SpO2:  [97 %] 97 % (02/17 0750) Weight:  [87.544 kg (193 lb)] 87.544 kg (193 lb) (02/17 0750)  General Appearance: Alert, cooperative, no distress, appears stated age Head: Normocephalic, without obvious abnormality, atraumatic Eyes: PERRL, conjunctiva/corneas clear, EOM's intact    Neck: Supple, symmetrical, trachea midline Back: Symmetric, no curvature, ROM normal, no CVA tenderness Lungs:  respirations unlabored Heart: Regular rate and rhythm Abdomen: Soft, non-tender Extremities: Extremities normal, atraumatic, no cyanosis or edema Pulses: 2+ and symmetric all extremities Skin: Skin color, texture, turgor normal, no rashes or lesions  NEUROLOGIC:   Mental status: Alert and oriented x4,  no aphasia, good attention span, fund of knowledge, and memory Motor Exam - grossly normal Sensory Exam - grossly normal Reflexes: 1+ Coordination - grossly normal Gait - grossly normal Balance - grossly normal Cranial Nerves: I: smell Not tested  II: visual acuity  OS: nl    OD: nl  II: visual fields Full to confrontation  II: pupils Equal, round, reactive to light  III,VII: ptosis None  III,IV,VI: extraocular muscles  Full ROM  V: mastication Normal  V: facial light touch sensation  Normal  V,VII: corneal reflex  Present  VII: facial muscle function - upper  Normal  VII: facial muscle function - lower Normal  VIII: hearing Not tested  IX: soft palate elevation  Normal  IX,X: gag reflex Present  XI: trapezius strength  5/5  XI: sternocleidomastoid strength 5/5  XI: neck flexion strength  5/5  XII: tongue  strength  Normal    Data Review Lab Results  Component Value Date   WBC 9.5 11/18/2015   HGB 11.9* 11/18/2015   HCT 36.2 11/18/2015   MCV 95.5 11/18/2015   PLT 322 11/18/2015   Lab Results  Component Value Date   NA 141 11/18/2015   K 4.4 11/18/2015   CL 102 11/18/2015   CO2 27 11/18/2015   BUN 28* 11/18/2015   CREATININE 1.49* 11/18/2015   GLUCOSE 187* 11/18/2015   Lab Results  Component Value Date   INR 0.98 11/18/2015    Assessment/Plan: Patient admitted for extraforaminal diskectomy L2-3 R. Patient has  failed a reasonable attempt at conservative therapy.  I explained the condition and procedure to the patient and answered any questions.  Patient wishes to proceed with procedure as planned. Understands risks/ benefits and typical outcomes of procedure.   Jashon Ishida S 11/26/2015 9:41 AM

## 2015-11-27 DIAGNOSIS — M5116 Intervertebral disc disorders with radiculopathy, lumbar region: Secondary | ICD-10-CM | POA: Diagnosis not present

## 2015-11-27 LAB — GLUCOSE, CAPILLARY: Glucose-Capillary: 198 mg/dL — ABNORMAL HIGH (ref 65–99)

## 2015-11-27 MED ORDER — METHOCARBAMOL 500 MG PO TABS: 500.0000 mg | ORAL_TABLET | Freq: Four times a day (QID) | ORAL | Status: DC

## 2015-11-27 NOTE — Discharge Summary (Signed)
Physician Discharge Summary  Patient ID: Katelyn [name] MRN: [id number] DOB/AGE: 59-10-1955 59 y.o.  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 She's had good relief of her preoperative pain.  Consults: None  Significant Diagnostic Studies: None  Treatments: surgery: Microdiscectomy L2-3 right extraforaminal with operating microscope  Discharge Exam: Blood pressure 105/55, pulse 60, temperature 98.3 F (36.8 C), temperature source Oral, resp. rate 18, weight 87.544 kg (193 lb), SpO2 95 %. Incision is clean and dry. Mild weakness in iliopsoas on the right. Station and gait are intact.  Disposition: 01-Home or Self Care  Discharge Instructions    Call MD for:  redness, tenderness, or signs of infection (pain, swelling, redness, odor or green/yellow discharge around incision site)    Complete by:  As directed      Call MD for:  severe uncontrolled pain    Complete by:  As directed      Call MD for:  temperature >100.4    Complete by:  As directed      Diet - low sodium heart healthy    Complete by:  As directed      Increase activity slowly    Complete by:  As directed             Medication List    TAKE these medications        aspirin 81 MG tablet  Take 81 mg by mouth at bedtime.     dicyclomine 10 MG capsule  Commonly known as:  BENTYL  Take 10 mg by mouth 2 (two) times daily.     DULoxetine 60 MG capsule  Commonly known as:  CYMBALTA  Take 60 mg by mouth every evening. Total of 90 mg     famotidine 20 MG tablet  Commonly known as:  PEPCID  Take 20 mg by mouth 3 (three) times daily.     furosemide 20 MG tablet   Commonly known as:  LASIX  Take 20 mg by mouth daily.     insulin aspart 100 UNIT/ML injection  Commonly known as:  novoLOG  Inject 12.5-25 Units into the skin daily before breakfast.     insulin detemir 100 UNIT/ML injection  Commonly known as:  LEVEMIR  Inject 25-50 Units into the skin at bedtime.     iron polysaccharides 150 MG capsule  Commonly known as:  NIFEREX  Take 150 mg by mouth 2 (two) times daily.     lisinopril-hydrochlorothiazide 20-12.5 MG tablet  Commonly known as:  PRINZIDE,ZESTORETIC  Take 1 tablet by mouth daily after breakfast.     metFORMIN 1000 MG tablet  Commonly known as:  GLUCOPHAGE  Take 1,000 mg by mouth 2 (two) times daily with a meal.     methocarbamol 500 MG tablet  Commonly known as:  ROBAXIN  Take 1 tablet (500 mg total) by mouth 4 (four) times daily.     mirtazapine 15 MG tablet  Commonly known as:  REMERON  Take 15 mg by mouth at bedtime.     Oxycodone HCl 10 MG Tabs  Take 10 mg by mouth every 6 (six) hours as needed (for pain).     pramipexole 0.25 MG tablet  Commonly known as:  MIRAPEX  Take 0.5  mg by mouth every evening.         SignedEarleen Newport 11/27/2015, 8:31 AM

## 2015-11-27 NOTE — Progress Notes (Signed)
Patient alert and oriented, mae's well, voiding adequate amount of urine, swallowing without difficulty, no c/o pain. Patient discharged home with family. Script and discharged instructions given to patient. Patient and family stated understanding of d/c instructions given and has an appointment with MD. 

## 2015-11-29 ENCOUNTER — Encounter (HOSPITAL_COMMUNITY): Payer: Self-pay | Admitting: Neurological Surgery

## 2015-12-07 DIAGNOSIS — G4700 Insomnia, unspecified: Secondary | ICD-10-CM | POA: Insufficient documentation

## 2015-12-07 DIAGNOSIS — F3340 Major depressive disorder, recurrent, in remission, unspecified: Secondary | ICD-10-CM | POA: Insufficient documentation

## 2015-12-07 DIAGNOSIS — G2581 Restless legs syndrome: Secondary | ICD-10-CM | POA: Insufficient documentation

## 2015-12-08 DIAGNOSIS — F3340 Major depressive disorder, recurrent, in remission, unspecified: Secondary | ICD-10-CM | POA: Diagnosis not present

## 2015-12-13 DIAGNOSIS — Z8709 Personal history of other diseases of the respiratory system: Secondary | ICD-10-CM | POA: Diagnosis not present

## 2016-01-13 ENCOUNTER — Encounter: Payer: Self-pay | Admitting: Sports Medicine

## 2016-01-13 ENCOUNTER — Ambulatory Visit (INDEPENDENT_AMBULATORY_CARE_PROVIDER_SITE_OTHER): Payer: PPO | Admitting: Sports Medicine

## 2016-01-13 ENCOUNTER — Ambulatory Visit (INDEPENDENT_AMBULATORY_CARE_PROVIDER_SITE_OTHER): Payer: PPO

## 2016-01-13 DIAGNOSIS — M6588 Other synovitis and tenosynovitis, other site: Secondary | ICD-10-CM

## 2016-01-13 DIAGNOSIS — E119 Type 2 diabetes mellitus without complications: Secondary | ICD-10-CM | POA: Diagnosis not present

## 2016-01-13 DIAGNOSIS — M79672 Pain in left foot: Secondary | ICD-10-CM

## 2016-01-13 DIAGNOSIS — M722 Plantar fascial fibromatosis: Secondary | ICD-10-CM

## 2016-01-13 DIAGNOSIS — M7750 Other enthesopathy of unspecified foot: Secondary | ICD-10-CM

## 2016-01-13 MED ORDER — DICLOFENAC SODIUM 75 MG PO TBEC: 75.0000 mg | DELAYED_RELEASE_TABLET | Freq: Two times a day (BID) | ORAL | Status: DC

## 2016-01-13 MED ORDER — METHYLPREDNISOLONE 4 MG PO TBPK: ORAL_TABLET | ORAL | Status: DC

## 2016-01-13 NOTE — Progress Notes (Signed)
Patient ID: KASHLYNN NAZZAL, female   DOB: November 10, 1955, 60 y.o.   MRN: [id number] Subjective: CYNIAH BILINSKI is a 60 y.o. Diabetic female patient presents to office with complaint of heel pain on the left. Patient admits to stabbing pain at the plantar aspect of her left heel and also extends to the inside of her left ankle which started about a week ago after taking a misstep as she was coming out of her attic. Patient has treated this problem with resting and ice, which seems to help a little. However, pain is still present and most intense when she goes to stand and walk. Patient is also diabetic; glucose not recorded this morning; aches that she was primarily on these medications while hospitalized for spine surgery, which she had about 8 weeks ago. Denies any other symptoms like burning, tingling or numbness to feet. States that she has extensive degenerative back disease and sciatica, but no current tingling to toes. Also admits to a history of restless leg syndrome. She denies any other complaints at this time.  Patient Active Problem List   Diagnosis Date Noted  . Cannot sleep 12/07/2015  . 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 Restless leg 12/07/2015  . S/P lumbar laminectomy 11/26/2015  . Postprocedural state 11/26/2015  . Angina pectoris ([place]) 11/24/2015  . CAD in native artery 11/24/2015  . Greater trochanteric bursitis of right hip 02/02/2012  . Iliotibial band syndrome of right side 02/02/2012  . Iliotibial band syndrome 02/02/2012  . Bursitis, trochanteric 02/02/2012    Current Outpatient Prescriptions on File Prior to Visit  Medication Sig Dispense Refill  . aspirin 81 MG tablet Take 81 mg by mouth at bedtime.     . dicyclomine (BENTYL) 10 MG capsule Take 10 mg by mouth 2 (two) times daily.    . DULoxetine (CYMBALTA) 60 MG capsule Take 60 mg by mouth every evening. Total of 90 mg    . famotidine (PEPCID) 20 MG tablet Take 20 mg by mouth 3 (three) times daily.    .  furosemide (LASIX) 20 MG tablet Take 20 mg by mouth daily.    . insulin aspart (NOVOLOG) 100 UNIT/ML injection Inject 12.5-25 Units into the skin daily before breakfast.    . insulin detemir (LEVEMIR) 100 UNIT/ML injection Inject 25-50 Units into the skin at bedtime.    . iron polysaccharides (NIFEREX) 150 MG capsule Take 150 mg by mouth 2 (two) times daily.    Marland Kitchen lisinopril-hydrochlorothiazide (PRINZIDE,ZESTORETIC) 20-12.5 MG per tablet Take 1 tablet by mouth daily after breakfast.    . metFORMIN (GLUCOPHAGE) 1000 MG tablet Take 1,000 mg by mouth 2 (two) times daily with a meal.    . methocarbamol (ROBAXIN) 500 MG tablet Take 1 tablet (500 mg total) by mouth 4 (four) times daily. 40 tablet 3  . mirtazapine (REMERON) 15 MG tablet Take 15 mg by mouth at bedtime.    . Oxycodone HCl 10 MG TABS Take 10 mg by mouth every 6 (six) hours as needed (for pain).   0  . pramipexole (MIRAPEX) 0.25 MG tablet Take 0.5 mg by mouth every evening.      No current facility-administered medications on file prior to visit.    Allergies  Allergen Reactions  . Atorvastatin Nausea And Vomiting    Other reaction(s): Myalgias (intolerance)  . Other Nausea Only    Anesthesia, not sure which type  . Pentazocine Lactate Other (See Comments)    Headache   . Talwin [  Pentazocine] Other (See Comments)    headache    Objective: Physical Exam General: The patient is alert and oriented x3 in no acute distress.  Dermatology: Skin is warm, dry and supple bilateral lower extremities. Nails 1-10 are normal. There is no erythema, edema, no eccymosis, no open lesions present. Integument is otherwise unremarkable.  Vascular: Dorsalis Pedis pulse and Posterior Tibial pulse are 2/4 bilateral. Capillary fill time is immediate to all digits.  Neurological: Grossly intact to light touch bilateral. Protective and epicritic sensation intact with Semmes Weinstein's monofilament. Vibratory intact bilateral. Negative Tinel's sign  bilateral.  Musculoskeletal: Tenderness to palpation at the medial calcaneal tubercale and through the insertion of the plantar fascia on the left foot. There is also tenderness with palpation along the posterior tibial tendon course, just distal to the medial malleolus. No pain with compression of calcaneus bilateral. No pain with tuning fork to calcaneus bilateral. No pain with calf compression bilateral. There ismild guarding with ankle joint range of motion on left. All other joints range of motion within normal limits bilateral. Strength 5/5 in all groups bilateral. On weightbearing. Patient able to tolerate standing still and able to perform heel rise test with minimal symptoms.   Gait: Unassisted, Antalgic avoid weight on left heel  Xray, Left foot:  Normal osseous mineralization. Joint spaces preserved. No fracture/dislocation/boney destruction however, at this point cannot totally rule out any type of stress fracture or involvement based on x-rays alone, Bipartite tibial sesamoid present, Calcaneal spur present with mild thickening of plantar fascia. No other soft tissue abnormalities or radiopaque foreign bodies.   Assessment and Plan: Problem List Items Addressed This Visit    None    Visit Diagnoses    Left foot pain    -  Primary    Relevant Medications    methylPREDNISolone (MEDROL DOSEPAK) 4 MG TBPK tablet    diclofenac (VOLTAREN) 75 MG EC tablet    Other Relevant Orders    DG Foot 2 Views Left    Plantar fasciitis of left foot        acute on chronic    Relevant Medications    methylPREDNISolone (MEDROL DOSEPAK) 4 MG TBPK tablet    diclofenac (VOLTAREN) 75 MG EC tablet    Tendonitis of ankle or foot        Left PT    Relevant Medications    methylPREDNISolone (MEDROL DOSEPAK) 4 MG TBPK tablet    diclofenac (VOLTAREN) 75 MG EC tablet    Diabetes mellitus without complication (HCC)        Relevant Medications    pravastatin (PRAVACHOL) 20 MG tablet      -Complete  examination performed. Discussed with patient in detail the condition of likely acute on chronic plantar fasciitis, and posterior tibial tendinitis after injury of misstep from attic -Dispensed cam boot and instructed on use to wear at all times when attempting ambulation -Rx Diclofenac and Medrol dose pack -Recommend protection, rest, ice and elevation daily until symptoms improve -Patient to return to office in 2 weeks for follow up or sooner if problems or questions arise.  Landis Martins, DPM

## 2016-01-13 NOTE — Patient Instructions (Signed)
Walking Boot A walking boot (controlled ankle motion boot or CAM walker) is a removable boot-shaped splint that holds your foot or ankle in place after an injury or a medical procedure. This helps with healing and prevents further injury. A walking boot has a stiff, rigid outer frame that limits movement and supports your leg and foot. The inner lining is a layer of padded material. Walking boots usually have several adjustable straps to secure them over the foot. Your health care provider may prescribe a walking boot if it is okay for you to use your injured foot to support your body weight. How much you can walk with the boot on will depend on the type and severity of your injury. Your health care provider will recommend the best boot for you based on your condition. HOW DO I PUT ON MY WALKING BOOT? There are different types of walking boots. Each type of boot has specific instructions about how to wear it properly. Follow instructions from your health care provider about wearing yours. In general:  Sit down to put on your boot. This is more comfortable and it helps to prevent falls.  Open up the boot fully. Place your foot into the boot so that your heel rests against the back.  Your toes should be supported by the base of the boot, but they should not hang over the front.  Adjust the straps so the boot fits securely but is not too tight.  Do not bend the hard frame of the boot to get a good fit.  Ask someone to help you put on the boot, if needed. WHAT ARE SOME TIPS FOR WALKING WITH A WALKING BOOT?  Do not try to walk without wearing the boot unless your health care provider has approved.  Rest your injured leg as much as possible.  Use other assistive walking devices as told by your health care provider. These include crutches and canes.  On your other foot, wear a shoe with a heel that is close to the height of the boot.  Be very careful when walking on surfaces that are uneven or  wet. HOW CAN I REDUCE SWELLING?  Rest your injured foot or leg as much as possible.  If directed, apply ice to the injured area:  Put ice in a plastic bag.  Place a towel between your skin and the bag.  Leave the ice on for 20 minutes, 2-3 times a day for two days or as told by your health care provider.  Keep your injured leg raised (elevated) above the level of your heart for 2-3 hours each day or as told by your health care provider.  If swelling gets worse, loosen the boot and rest and raise your foot.  Contact your health care provider if swelling does not get better or if it gets worse over time. WHAT SKIN CARE PRACTICES SHOULD I FOLLOW?  Wear a long sock to protect your foot and leg from rubbing inside the boot.  Take off the boot one time per day to check the injured area.  Follow instructions from your health care provider about taking care of your incision or wound, if this applies.  Clean and wash the injured area as told by your health care provider.  Gently dry your foot and leg before putting the boot back on.  Contact your health care provider if a wound is getting worse or if your skin becomes red, painful, or irritated. ARE THERE ANY ACTIVITY RESTRICTIONS? Activity  restrictions depend on the type and severity of your injury. Follow instructions from your health care provider.  Bathe and shower as directed by your health care provider.  Do not do activities that could make your injury worse.  Do not drive if your affected foot is one that you usually use for driving. HOW SHOULD I KEEP MY BOOT CLEAN?  Clean the frame and the liner of the boot by hand. Use a washcloth with mild soap and water.  Do not use chemical cleaning products. These could irritate your skin, especially if you have a wound or an incision.  Do not soak the liner of the boot.  Do not put any part of the boot in a washing machine or a clothes dryer.  Allow the boot to air dry  completely before you put it back on your foot.   This information is not intended to replace advice given to you by your health care provider. Make sure you discuss any questions you have with your health care provider.   Document Released: 02/09/2015 Document Reviewed: 02/09/2015 Elsevier Interactive Patient Education [employer].

## 2016-01-20 ENCOUNTER — Encounter: Payer: Self-pay | Admitting: Sports Medicine

## 2016-01-20 ENCOUNTER — Ambulatory Visit (INDEPENDENT_AMBULATORY_CARE_PROVIDER_SITE_OTHER): Payer: PPO | Admitting: Sports Medicine

## 2016-01-20 DIAGNOSIS — E119 Type 2 diabetes mellitus without complications: Secondary | ICD-10-CM

## 2016-01-20 DIAGNOSIS — M722 Plantar fascial fibromatosis: Secondary | ICD-10-CM

## 2016-01-20 DIAGNOSIS — M79672 Pain in left foot: Secondary | ICD-10-CM | POA: Diagnosis not present

## 2016-01-20 DIAGNOSIS — M6588 Other synovitis and tenosynovitis, other site: Secondary | ICD-10-CM | POA: Diagnosis not present

## 2016-01-20 DIAGNOSIS — M7750 Other enthesopathy of unspecified foot: Secondary | ICD-10-CM

## 2016-01-20 MED ORDER — TRIAMCINOLONE ACETONIDE 10 MG/ML IJ SUSP
10.0000 mg | Freq: Once | INTRAMUSCULAR | Status: DC
Start: 2016-01-20 — End: 2023-02-22

## 2016-01-20 NOTE — Progress Notes (Signed)
Patient ID: Katelyn [name], female   DOB: July 09, 1956, 60 y.o.   MRN: [id number]  Subjective: Katelyn [name] is a 60 y.o. Diabetic female patient returns to office with complaint of heel pain on the left. Patient  States that she try using the boot, but again her more back problems. States that her foot pain feels somewhat better; she is back in normal shoes with improvement in symptoms however, reports that she could not use the boot longer than a few days because of the extensive pain that it produce in her back. States that she tried wearing shoe of normal height. However, the weight of the boot was uncomfortable for her. Patient denies any other pedal complaints at this time.   Fasting blood sugar 100  Patient Active Problem List   Diagnosis Date Noted  . Cannot sleep 12/07/2015  . 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 Restless leg 12/07/2015  . S/P lumbar laminectomy 11/26/2015  . Postprocedural state 11/26/2015  . Angina pectoris ([place]) 11/24/2015  . CAD in native artery 11/24/2015  . Greater trochanteric bursitis of right hip 02/02/2012  . Iliotibial band syndrome of right side 02/02/2012  . Iliotibial band syndrome 02/02/2012  . Bursitis, trochanteric 02/02/2012    Current Outpatient Prescriptions on File Prior to Visit  Medication Sig Dispense Refill  . aspirin 81 MG tablet Take 81 mg by mouth at bedtime.     . diclofenac (VOLTAREN) 75 MG EC tablet Take 1 tablet (75 mg total) by mouth 2 (two) times daily. 30 tablet 0  . dicyclomine (BENTYL) 10 MG capsule Take 10 mg by mouth 2 (two) times daily.    . DULoxetine (CYMBALTA) 60 MG capsule Take 60 mg by mouth every evening. Total of 90 mg    . famotidine (PEPCID) 20 MG tablet Take 20 mg by mouth 3 (three) times daily.    . furosemide (LASIX) 20 MG tablet Take 20 mg by mouth daily.    . insulin aspart (NOVOLOG) 100 UNIT/ML injection Inject 12.5-25 Units into the skin daily before breakfast.    . insulin detemir (LEVEMIR)  100 UNIT/ML injection Inject 25-50 Units into the skin at bedtime.    . iron polysaccharides (NIFEREX) 150 MG capsule Take 150 mg by mouth 2 (two) times daily.    Marland Kitchen lisinopril-hydrochlorothiazide (PRINZIDE,ZESTORETIC) 20-12.5 MG per tablet Take 1 tablet by mouth daily after breakfast.    . metFORMIN (GLUCOPHAGE) 1000 MG tablet Take 1,000 mg by mouth 2 (two) times daily with a meal.    . methocarbamol (ROBAXIN) 500 MG tablet Take 1 tablet (500 mg total) by mouth 4 (four) times daily. 40 tablet 3  . methylPREDNISolone (MEDROL DOSEPAK) 4 MG TBPK tablet     . methylPREDNISolone (MEDROL DOSEPAK) 4 MG TBPK tablet Take as instructed 21 tablet 0  . mirtazapine (REMERON) 15 MG tablet Take 15 mg by mouth at bedtime.    . nitroGLYCERIN (NITROSTAT) 0.4 MG SL tablet     . Oxycodone HCl 10 MG TABS Take 10 mg by mouth every 6 (six) hours as needed (for pain).   0  . oxyCODONE-acetaminophen (PERCOCET) 10-325 MG tablet     . pramipexole (MIRAPEX) 0.25 MG tablet Take 0.5 mg by mouth every evening.     . pravastatin (PRAVACHOL) 20 MG tablet      No current facility-administered medications on file prior to visit.    Allergies  Allergen Reactions  . Atorvastatin Nausea And Vomiting    Other reaction(s):  Myalgias (intolerance)  . Other Nausea Only    Anesthesia, not sure which type  . Pentazocine Lactate Other (See Comments)    Headache   . Talwin [Pentazocine] Other (See Comments)    headache    Objective: Physical Exam General: The patient is alert and oriented x3 in no acute distress.  Dermatology: Skin is warm, dry and supple bilateral lower extremities. Nails 1-10 are normal. There is no erythema, edema, no eccymosis, no open lesions present. Integument is otherwise unremarkable.  Vascular: Dorsalis Pedis pulse and Posterior Tibial pulse are 2/4 bilateral. Capillary fill time is immediate to all digits.  Neurological: Grossly intact to light touch bilateral. Protective and epicritic sensation  intact with Semmes Weinstein's monofilament. Vibratory intact bilateral. Negative Tinel's sign bilateral.  Musculoskeletal:  There isTenderness to palpation at the medial calcaneal tubercale and through the insertion of the plantar fascia on the left foot. There is  no tenderness with palpation along the posterior tibial tendon course. No pain with compression of calcaneus bilateral. No pain with tuning fork to calcaneus bilateral. No pain with calf compression bilateral. There ismild guarding with ankle joint range of motion on left. All other joints range of motion within normal limits bilateral. Strength 5/5 in all groups bilateral.   Assessment and Plan: Problem List Items Addressed This Visit    None    Visit Diagnoses    Left foot pain    -  Primary    Plantar fasciitis of left foot        Relevant Medications    triamcinolone acetonide (KENALOG) 10 MG/ML injection 10 mg (Start on 01/20/2016  1:00 PM)    Tendonitis of ankle or foot        Resolved at PT    Diabetes mellitus without complication (HCC)          -Complete examination performed. Discussed with patient in detail the condition of  Plantar fasciitis and now resolved posterior tibial tendinitis After oral consent and aseptic prep, injected a mixture containing 1 ml of 2%  plain lidocaine, 1 ml 0.5% plain marcaine, 0.5 ml of kenalog 10 and 0.5 ml of dexamethasone phosphate into  Left heel at medial calcaneal tubercle. Post-injection care discussed with patient.  -Dispensed fascial brace. Instructed patient on use -Continue with diclofenac until complete -Recommend protection, gentle stretching, rest, ice and elevation daily until symptoms improve -Continue with good supportive sneaker with insert -Advise daily inspection of feet in the setting of diabetes -Patient to return to office in 3 weeks for follow up or sooner if problems or questions arise.  Landis Martins, DPM

## 2016-01-24 DIAGNOSIS — K222 Esophageal obstruction: Secondary | ICD-10-CM | POA: Diagnosis not present

## 2016-01-24 DIAGNOSIS — E1149 Type 2 diabetes mellitus with other diabetic neurological complication: Secondary | ICD-10-CM | POA: Diagnosis not present

## 2016-01-24 DIAGNOSIS — E785 Hyperlipidemia, unspecified: Secondary | ICD-10-CM | POA: Diagnosis not present

## 2016-01-24 DIAGNOSIS — I2510 Atherosclerotic heart disease of native coronary artery without angina pectoris: Secondary | ICD-10-CM | POA: Diagnosis not present

## 2016-01-24 DIAGNOSIS — G4730 Sleep apnea, unspecified: Secondary | ICD-10-CM | POA: Diagnosis not present

## 2016-01-24 DIAGNOSIS — F329 Major depressive disorder, single episode, unspecified: Secondary | ICD-10-CM | POA: Diagnosis not present

## 2016-01-24 DIAGNOSIS — E1142 Type 2 diabetes mellitus with diabetic polyneuropathy: Secondary | ICD-10-CM | POA: Diagnosis not present

## 2016-01-24 DIAGNOSIS — R740 Nonspecific elevation of levels of transaminase and lactic acid dehydrogenase [LDH]: Secondary | ICD-10-CM | POA: Diagnosis not present

## 2016-01-24 DIAGNOSIS — Z1389 Encounter for screening for other disorder: Secondary | ICD-10-CM | POA: Diagnosis not present

## 2016-01-24 DIAGNOSIS — M5416 Radiculopathy, lumbar region: Secondary | ICD-10-CM | POA: Diagnosis not present

## 2016-01-24 DIAGNOSIS — Z6835 Body mass index (BMI) 35.0-35.9, adult: Secondary | ICD-10-CM | POA: Diagnosis not present

## 2016-01-24 DIAGNOSIS — I10 Essential (primary) hypertension: Secondary | ICD-10-CM | POA: Diagnosis not present

## 2016-01-27 ENCOUNTER — Ambulatory Visit: Payer: PPO | Admitting: Sports Medicine

## 2016-01-27 DIAGNOSIS — H5203 Hypermetropia, bilateral: Secondary | ICD-10-CM | POA: Diagnosis not present

## 2016-01-27 DIAGNOSIS — E119 Type 2 diabetes mellitus without complications: Secondary | ICD-10-CM | POA: Diagnosis not present

## 2016-02-04 DIAGNOSIS — Z1231 Encounter for screening mammogram for malignant neoplasm of breast: Secondary | ICD-10-CM | POA: Diagnosis not present

## 2016-02-04 DIAGNOSIS — Z803 Family history of malignant neoplasm of breast: Secondary | ICD-10-CM | POA: Diagnosis not present

## 2016-02-09 ENCOUNTER — Ambulatory Visit: Payer: PPO | Admitting: Sports Medicine

## 2016-02-28 DIAGNOSIS — B009 Herpesviral infection, unspecified: Secondary | ICD-10-CM | POA: Diagnosis not present

## 2016-02-29 DIAGNOSIS — N183 Chronic kidney disease, stage 3 (moderate): Secondary | ICD-10-CM | POA: Diagnosis not present

## 2016-02-29 DIAGNOSIS — R0609 Other forms of dyspnea: Secondary | ICD-10-CM | POA: Diagnosis not present

## 2016-02-29 DIAGNOSIS — I2510 Atherosclerotic heart disease of native coronary artery without angina pectoris: Secondary | ICD-10-CM | POA: Diagnosis not present

## 2016-03-07 DIAGNOSIS — R079 Chest pain, unspecified: Secondary | ICD-10-CM | POA: Diagnosis not present

## 2016-03-07 DIAGNOSIS — Z794 Long term (current) use of insulin: Secondary | ICD-10-CM | POA: Diagnosis not present

## 2016-03-07 DIAGNOSIS — E119 Type 2 diabetes mellitus without complications: Secondary | ICD-10-CM | POA: Diagnosis not present

## 2016-03-07 DIAGNOSIS — R0602 Shortness of breath: Secondary | ICD-10-CM | POA: Diagnosis not present

## 2016-03-07 DIAGNOSIS — Z87442 Personal history of urinary calculi: Secondary | ICD-10-CM | POA: Diagnosis not present

## 2016-03-07 DIAGNOSIS — I10 Essential (primary) hypertension: Secondary | ICD-10-CM | POA: Diagnosis not present

## 2016-03-07 DIAGNOSIS — I447 Left bundle-branch block, unspecified: Secondary | ICD-10-CM | POA: Diagnosis not present

## 2016-03-07 DIAGNOSIS — R0789 Other chest pain: Secondary | ICD-10-CM | POA: Diagnosis not present

## 2016-03-07 DIAGNOSIS — E785 Hyperlipidemia, unspecified: Secondary | ICD-10-CM | POA: Diagnosis not present

## 2016-03-07 DIAGNOSIS — N200 Calculus of kidney: Secondary | ICD-10-CM | POA: Diagnosis not present

## 2016-03-07 DIAGNOSIS — Z79899 Other long term (current) drug therapy: Secondary | ICD-10-CM | POA: Diagnosis not present

## 2016-03-07 DIAGNOSIS — Z7982 Long term (current) use of aspirin: Secondary | ICD-10-CM | POA: Diagnosis not present

## 2016-03-07 DIAGNOSIS — E669 Obesity, unspecified: Secondary | ICD-10-CM | POA: Diagnosis not present

## 2016-03-07 DIAGNOSIS — M7989 Other specified soft tissue disorders: Secondary | ICD-10-CM | POA: Diagnosis not present

## 2016-03-07 DIAGNOSIS — I2510 Atherosclerotic heart disease of native coronary artery without angina pectoris: Secondary | ICD-10-CM | POA: Diagnosis not present

## 2016-03-07 DIAGNOSIS — D509 Iron deficiency anemia, unspecified: Secondary | ICD-10-CM | POA: Diagnosis not present

## 2016-03-07 DIAGNOSIS — N179 Acute kidney failure, unspecified: Secondary | ICD-10-CM | POA: Diagnosis not present

## 2016-03-07 DIAGNOSIS — M109 Gout, unspecified: Secondary | ICD-10-CM | POA: Diagnosis not present

## 2016-03-08 DIAGNOSIS — N289 Disorder of kidney and ureter, unspecified: Secondary | ICD-10-CM | POA: Diagnosis not present

## 2016-03-08 DIAGNOSIS — I2510 Atherosclerotic heart disease of native coronary artery without angina pectoris: Secondary | ICD-10-CM | POA: Diagnosis not present

## 2016-03-08 DIAGNOSIS — R0602 Shortness of breath: Secondary | ICD-10-CM | POA: Diagnosis not present

## 2016-03-08 DIAGNOSIS — E784 Other hyperlipidemia: Secondary | ICD-10-CM | POA: Diagnosis not present

## 2016-03-08 DIAGNOSIS — R0789 Other chest pain: Secondary | ICD-10-CM | POA: Diagnosis not present

## 2016-03-08 DIAGNOSIS — R079 Chest pain, unspecified: Secondary | ICD-10-CM | POA: Diagnosis not present

## 2016-03-08 DIAGNOSIS — E669 Obesity, unspecified: Secondary | ICD-10-CM | POA: Diagnosis not present

## 2016-03-09 DIAGNOSIS — E119 Type 2 diabetes mellitus without complications: Secondary | ICD-10-CM | POA: Diagnosis not present

## 2016-03-09 DIAGNOSIS — E669 Obesity, unspecified: Secondary | ICD-10-CM | POA: Diagnosis not present

## 2016-03-09 DIAGNOSIS — N289 Disorder of kidney and ureter, unspecified: Secondary | ICD-10-CM | POA: Diagnosis not present

## 2016-03-09 DIAGNOSIS — I2510 Atherosclerotic heart disease of native coronary artery without angina pectoris: Secondary | ICD-10-CM | POA: Diagnosis not present

## 2016-03-09 DIAGNOSIS — R0789 Other chest pain: Secondary | ICD-10-CM | POA: Diagnosis not present

## 2016-03-09 DIAGNOSIS — I10 Essential (primary) hypertension: Secondary | ICD-10-CM | POA: Diagnosis not present

## 2016-03-09 DIAGNOSIS — R079 Chest pain, unspecified: Secondary | ICD-10-CM | POA: Diagnosis not present

## 2016-03-09 DIAGNOSIS — E784 Other hyperlipidemia: Secondary | ICD-10-CM | POA: Diagnosis not present

## 2016-03-14 DIAGNOSIS — Z794 Long term (current) use of insulin: Secondary | ICD-10-CM

## 2016-03-14 DIAGNOSIS — D500 Iron deficiency anemia secondary to blood loss (chronic): Secondary | ICD-10-CM | POA: Insufficient documentation

## 2016-03-14 DIAGNOSIS — G8929 Other chronic pain: Secondary | ICD-10-CM

## 2016-03-14 DIAGNOSIS — M109 Gout, unspecified: Secondary | ICD-10-CM

## 2016-03-14 DIAGNOSIS — E119 Type 2 diabetes mellitus without complications: Secondary | ICD-10-CM | POA: Insufficient documentation

## 2016-03-14 DIAGNOSIS — K219 Gastro-esophageal reflux disease without esophagitis: Secondary | ICD-10-CM | POA: Insufficient documentation

## 2016-03-14 DIAGNOSIS — M545 Low back pain, unspecified: Secondary | ICD-10-CM | POA: Insufficient documentation

## 2016-03-14 DIAGNOSIS — I10 Essential (primary) hypertension: Secondary | ICD-10-CM | POA: Insufficient documentation

## 2016-03-14 HISTORY — DX: Other chronic pain: G89.29

## 2016-03-14 HISTORY — DX: Gout, unspecified: M10.9

## 2016-03-14 HISTORY — DX: Iron deficiency anemia secondary to blood loss (chronic): D50.0

## 2016-03-16 DIAGNOSIS — R0789 Other chest pain: Secondary | ICD-10-CM | POA: Diagnosis not present

## 2016-03-16 DIAGNOSIS — R0602 Shortness of breath: Secondary | ICD-10-CM | POA: Diagnosis not present

## 2016-03-21 DIAGNOSIS — R8290 Unspecified abnormal findings in urine: Secondary | ICD-10-CM | POA: Diagnosis not present

## 2016-04-03 DIAGNOSIS — Z6835 Body mass index (BMI) 35.0-35.9, adult: Secondary | ICD-10-CM | POA: Diagnosis not present

## 2016-04-03 DIAGNOSIS — M545 Low back pain: Secondary | ICD-10-CM | POA: Diagnosis not present

## 2016-04-03 DIAGNOSIS — I10 Essential (primary) hypertension: Secondary | ICD-10-CM | POA: Diagnosis not present

## 2016-04-04 DIAGNOSIS — R0602 Shortness of breath: Secondary | ICD-10-CM | POA: Diagnosis not present

## 2016-04-04 LAB — PULMONARY FUNCTION TEST

## 2016-04-05 DIAGNOSIS — R293 Abnormal posture: Secondary | ICD-10-CM | POA: Diagnosis not present

## 2016-04-05 DIAGNOSIS — R2689 Other abnormalities of gait and mobility: Secondary | ICD-10-CM | POA: Diagnosis not present

## 2016-04-05 DIAGNOSIS — M6281 Muscle weakness (generalized): Secondary | ICD-10-CM | POA: Diagnosis not present

## 2016-04-05 DIAGNOSIS — M2560 Stiffness of unspecified joint, not elsewhere classified: Secondary | ICD-10-CM | POA: Diagnosis not present

## 2016-04-05 DIAGNOSIS — M545 Low back pain: Secondary | ICD-10-CM | POA: Diagnosis not present

## 2016-04-10 DIAGNOSIS — J984 Other disorders of lung: Secondary | ICD-10-CM

## 2016-04-10 HISTORY — DX: Other disorders of lung: J98.4

## 2016-04-12 DIAGNOSIS — M2560 Stiffness of unspecified joint, not elsewhere classified: Secondary | ICD-10-CM | POA: Diagnosis not present

## 2016-04-12 DIAGNOSIS — R2689 Other abnormalities of gait and mobility: Secondary | ICD-10-CM | POA: Diagnosis not present

## 2016-04-12 DIAGNOSIS — M6281 Muscle weakness (generalized): Secondary | ICD-10-CM | POA: Diagnosis not present

## 2016-04-12 DIAGNOSIS — R293 Abnormal posture: Secondary | ICD-10-CM | POA: Diagnosis not present

## 2016-04-12 DIAGNOSIS — M545 Low back pain: Secondary | ICD-10-CM | POA: Diagnosis not present

## 2016-05-15 DIAGNOSIS — I10 Essential (primary) hypertension: Secondary | ICD-10-CM | POA: Diagnosis not present

## 2016-05-15 DIAGNOSIS — R740 Nonspecific elevation of levels of transaminase and lactic acid dehydrogenase [LDH]: Secondary | ICD-10-CM | POA: Diagnosis not present

## 2016-05-15 DIAGNOSIS — Z6834 Body mass index (BMI) 34.0-34.9, adult: Secondary | ICD-10-CM | POA: Diagnosis not present

## 2016-05-15 DIAGNOSIS — M109 Gout, unspecified: Secondary | ICD-10-CM | POA: Diagnosis not present

## 2016-05-15 DIAGNOSIS — R0609 Other forms of dyspnea: Secondary | ICD-10-CM | POA: Diagnosis not present

## 2016-05-15 DIAGNOSIS — M5416 Radiculopathy, lumbar region: Secondary | ICD-10-CM | POA: Diagnosis not present

## 2016-05-15 DIAGNOSIS — G4739 Other sleep apnea: Secondary | ICD-10-CM | POA: Diagnosis not present

## 2016-05-15 DIAGNOSIS — E1142 Type 2 diabetes mellitus with diabetic polyneuropathy: Secondary | ICD-10-CM | POA: Diagnosis not present

## 2016-05-15 DIAGNOSIS — K222 Esophageal obstruction: Secondary | ICD-10-CM | POA: Diagnosis not present

## 2016-05-15 DIAGNOSIS — D508 Other iron deficiency anemias: Secondary | ICD-10-CM | POA: Diagnosis not present

## 2016-05-15 DIAGNOSIS — E784 Other hyperlipidemia: Secondary | ICD-10-CM | POA: Diagnosis not present

## 2016-05-15 DIAGNOSIS — N200 Calculus of kidney: Secondary | ICD-10-CM | POA: Diagnosis not present

## 2016-05-16 DIAGNOSIS — M2560 Stiffness of unspecified joint, not elsewhere classified: Secondary | ICD-10-CM | POA: Diagnosis not present

## 2016-05-16 DIAGNOSIS — R2689 Other abnormalities of gait and mobility: Secondary | ICD-10-CM | POA: Diagnosis not present

## 2016-05-16 DIAGNOSIS — M545 Low back pain: Secondary | ICD-10-CM | POA: Diagnosis not present

## 2016-05-16 DIAGNOSIS — M6281 Muscle weakness (generalized): Secondary | ICD-10-CM | POA: Diagnosis not present

## 2016-05-16 DIAGNOSIS — R293 Abnormal posture: Secondary | ICD-10-CM | POA: Diagnosis not present

## 2016-05-24 DIAGNOSIS — N189 Chronic kidney disease, unspecified: Secondary | ICD-10-CM | POA: Diagnosis not present

## 2016-05-24 DIAGNOSIS — E119 Type 2 diabetes mellitus without complications: Secondary | ICD-10-CM | POA: Diagnosis not present

## 2016-05-24 DIAGNOSIS — R0609 Other forms of dyspnea: Secondary | ICD-10-CM | POA: Diagnosis not present

## 2016-05-24 DIAGNOSIS — J984 Other disorders of lung: Secondary | ICD-10-CM | POA: Diagnosis not present

## 2016-05-24 DIAGNOSIS — G4733 Obstructive sleep apnea (adult) (pediatric): Secondary | ICD-10-CM | POA: Insufficient documentation

## 2016-05-24 DIAGNOSIS — D509 Iron deficiency anemia, unspecified: Secondary | ICD-10-CM | POA: Diagnosis not present

## 2016-05-24 DIAGNOSIS — K219 Gastro-esophageal reflux disease without esophagitis: Secondary | ICD-10-CM | POA: Diagnosis not present

## 2016-05-24 DIAGNOSIS — G2581 Restless legs syndrome: Secondary | ICD-10-CM | POA: Diagnosis not present

## 2016-05-24 HISTORY — DX: Obstructive sleep apnea (adult) (pediatric): G47.33

## 2016-05-29 DIAGNOSIS — M545 Low back pain: Secondary | ICD-10-CM | POA: Diagnosis not present

## 2016-05-31 DIAGNOSIS — R0609 Other forms of dyspnea: Secondary | ICD-10-CM | POA: Diagnosis not present

## 2016-05-31 DIAGNOSIS — I2510 Atherosclerotic heart disease of native coronary artery without angina pectoris: Secondary | ICD-10-CM | POA: Diagnosis not present

## 2016-05-31 DIAGNOSIS — N183 Chronic kidney disease, stage 3 (moderate): Secondary | ICD-10-CM | POA: Diagnosis not present

## 2016-06-09 DIAGNOSIS — E782 Mixed hyperlipidemia: Secondary | ICD-10-CM | POA: Diagnosis not present

## 2016-06-09 DIAGNOSIS — G4700 Insomnia, unspecified: Secondary | ICD-10-CM | POA: Diagnosis not present

## 2016-06-09 DIAGNOSIS — G2581 Restless legs syndrome: Secondary | ICD-10-CM | POA: Diagnosis not present

## 2016-06-09 DIAGNOSIS — F3340 Major depressive disorder, recurrent, in remission, unspecified: Secondary | ICD-10-CM | POA: Diagnosis not present

## 2016-06-09 DIAGNOSIS — D509 Iron deficiency anemia, unspecified: Secondary | ICD-10-CM | POA: Diagnosis not present

## 2016-06-09 DIAGNOSIS — I10 Essential (primary) hypertension: Secondary | ICD-10-CM | POA: Diagnosis not present

## 2016-06-21 DIAGNOSIS — M4806 Spinal stenosis, lumbar region: Secondary | ICD-10-CM | POA: Diagnosis not present

## 2016-06-21 DIAGNOSIS — M545 Low back pain: Secondary | ICD-10-CM | POA: Diagnosis not present

## 2016-06-22 DIAGNOSIS — R0602 Shortness of breath: Secondary | ICD-10-CM | POA: Diagnosis not present

## 2016-06-22 DIAGNOSIS — K210 Gastro-esophageal reflux disease with esophagitis: Secondary | ICD-10-CM | POA: Diagnosis not present

## 2016-06-22 DIAGNOSIS — D500 Iron deficiency anemia secondary to blood loss (chronic): Secondary | ICD-10-CM | POA: Diagnosis not present

## 2016-07-03 DIAGNOSIS — M545 Low back pain: Secondary | ICD-10-CM | POA: Diagnosis not present

## 2016-07-03 DIAGNOSIS — M4806 Spinal stenosis, lumbar region: Secondary | ICD-10-CM | POA: Diagnosis not present

## 2016-07-04 ENCOUNTER — Ambulatory Visit (INDEPENDENT_AMBULATORY_CARE_PROVIDER_SITE_OTHER): Payer: PPO | Admitting: Pulmonary Disease

## 2016-07-04 ENCOUNTER — Encounter: Payer: Self-pay | Admitting: Pulmonary Disease

## 2016-07-04 VITALS — BP 126/82 | HR 90 | Ht 62.0 in | Wt 193.8 lb

## 2016-07-04 DIAGNOSIS — R0600 Dyspnea, unspecified: Secondary | ICD-10-CM | POA: Diagnosis not present

## 2016-07-04 DIAGNOSIS — Z23 Encounter for immunization: Secondary | ICD-10-CM

## 2016-07-04 NOTE — Patient Instructions (Signed)
Please get copy of your medical records from [facility] and drop off at our office.  Please also ask them to make a disc with your xrays and CT scans.  Follow up in 2 weeks with Dr. [name].

## 2016-07-04 NOTE — Progress Notes (Signed)
Past surgical history She  has a past surgical history that includes Right shoulder surgery (2010); Appendectomy (Age 60); Abdominal hysterectomy (1983); Cholecystectomy (1990's); Left knee surgery x 2 (1996); Kidney stone surgery (2008); Back surgery; CYSTO EXTRACTION KIDNEY STONES; Excision/release bursa hip (02/02/2012); Eye surgery; Carpal tunnel release; and Lumbar laminectomy/decompression microdiscectomy (Right, 11/26/2015).  Family history Her family history includes Asthma in her sister; Bone cancer in her sister; Heart failure in her mother; Hypertension in her father; Lung cancer in her father; Stroke in her father.  Social history She  reports that she has never smoked. She has never used smokeless tobacco. She reports that she drinks alcohol. She reports that she does not use drugs.  Allergies  Allergen Reactions  . Atorvastatin Nausea And Vomiting    Other reaction(s): Myalgias (intolerance)  . Other Nausea Only    Anesthesia, not sure which type  . Pentazocine Lactate Other (See Comments)    Headache   . Talwin [Pentazocine] Other (See Comments)    headache    Review of Systems Negative except below  Current Outpatient Prescriptions on File Prior to Visit  Medication Sig  . aspirin 81 MG tablet Take 81 mg by mouth at bedtime.   . DULoxetine (CYMBALTA) 60 MG capsule Take 60 mg by mouth every evening.   . furosemide (LASIX) 20 MG tablet Take 20 mg by mouth daily.  . insulin aspart (NOVOLOG) 100 UNIT/ML injection Inject 12.5-25 Units into the skin daily before breakfast.  . lisinopril-hydrochlorothiazide (PRINZIDE,ZESTORETIC) 20-12.5 MG per tablet Take 1 tablet by mouth daily after breakfast.  . mirtazapine (REMERON) 15 MG tablet Take 15 mg by mouth at bedtime.  . nitroGLYCERIN (NITROSTAT) 0.4 MG SL tablet   . pramipexole (MIRAPEX) 0.25 MG tablet Take 0.5 mg by mouth every evening.    Current Facility-Administered Medications on File Prior to Visit  Medication  .  triamcinolone acetonide (KENALOG) 10 MG/ML injection 10 mg    Chief Complaint  Patient presents with  . Pulmonary Consult    Referred by Dr. Teressa Lower. Pt c/o SOB for the past 4-6 months. Pt states she gets SOB walking short distances such as accross the parking lot here today. She also gets winded walking up any sort of incline. She also c/o occ cough with thick, white sputum.      Pulmonary tests PFT 04/04/16 >> FEV1 1.77 (74%), FEV1% 87, TLC 3.69 (77%), DLCO 69%, no BD  Past medical history She  has a past medical history of Anemia; Anxiety; Arthritis; Bursitis of right hip; CAD (coronary artery disease); Complication of anesthesia; Depression; Essential hypertension; GERD (gastroesophageal reflux disease); H/O hiatal hernia; Hypercholesterolemia; Left bundle branch block; Lumbar stenosis; Nephrolithiasis; PONV (postoperative nausea and vomiting); Restless leg syndrome; Sleep apnea; and Type 2 diabetes mellitus ([place]).  Vital signs BP 126/82 (BP Location: Left Arm, Cuff Size: Normal)   Pulse 90   Ht 5\' 2"  (1.575 m)   Wt 193 lb 12.8 oz (87.9 kg)   SpO2 96%   BMI 35.45 kg/m   History of present illness Katelyn [name] is a 60 y.o. female with progressive dyspnea.  She has noticed some trouble with her breathing for years.  This seemed to get much worse after she had lower back surgery in February 2017.  She gets winded with any activity.  She can't walk through the grocery store w/o stopping.  She can't really walk up stairs.  She doesn't feel like she can do any activity.  When she does  exert herself, she feels her heart racing and feels her pulse pounding in her neck.  This gets better after she rests for few minutes.   She has not been getting chest pain.  She does not usually have cough, wheeze, or sputum.  She denies history of thromboembolic disease. She does get leg swelling, but this is controlled with lasix.  She never smoked cigarettes, but her parents and her husband  smoked.  She was hospitalized recently in [place].  She reports having lab tests, ECG, and CT chest.  These apparently were all unrevealing.  Physical exam  General - No distress ENT - No sinus tenderness, no oral exudate, no LAN, no thyromegaly, TM clear, pupils equal/reactive Cardiac - s1s2 regular, no murmur, pulses symmetric Chest - No wheeze/rales/dullness, good air entry, normal respiratory excursion Back - No focal tenderness Abd - Soft, non-tender, no organomegaly, + bowel sounds Ext - No edema Neuro - Normal strength, cranial nerves intact Skin - No rashes Psych - Normal mood, and behavior   CMP Latest Ref Rng & Units 11/18/2015 01/31/2012 06/20/2011  Glucose 65 - 99 mg/dL 187(H) 176(H) 193(H)  BUN 6 - 20 mg/dL 28(H) 19 18  Creatinine 0.44 - 1.00 mg/dL 1.49(H) 0.80 1.02  Sodium 135 - 145 mmol/L 141 137 141  Potassium 3.5 - 5.1 mmol/L 4.4 5.2(H) 5.2(H)  Chloride 101 - 111 mmol/L 102 102 102  CO2 22 - 32 mmol/L 27 29 27   Calcium 8.9 - 10.3 mg/dL 9.8 9.7 9.9  Total Protein 6.0 - 8.3 g/dL - 7.3 -  Total Bilirubin 0.3 - 1.2 mg/dL - 0.2(L) -  Alkaline Phos 39 - 117 U/L - 75 -  AST 0 - 37 U/L - 52(H) -  ALT 0 - 35 U/L - 56(H) -     CBC Latest Ref Rng & Units 11/18/2015 02/03/2012 01/31/2012  WBC 4.0 - 10.5 K/uL 9.5 10.1 7.6  Hemoglobin 12.0 - 15.0 g/dL 11.9(L) 10.6(L) 12.4  Hematocrit 36.0 - 46.0 % 36.2 31.7(L) 37.3  Platelets 150 - 400 K/uL 322 276 326    Discussion She has dyspnea on exertion.  This has progressed since she has back surgery in February 2017.  She had recent PFT that showed mild restrictive and diffusion defect.  She had CXR from February 2017 that was negative, and reports having normal CT chest during recent hospital stay in [place].  Her symptoms are not suggestive of obstructive lung disease.  She does have history of coronary artery disease and reports history of murmur.  She will be scheduling follow up appointment with cardiology (Dr. Bettina Gavia) in  [place].  I am also concerned that deconditioning due to inactivity after back surgery is playing a significant role in her current symptoms.   Assessment/plan  Dyspnea on exertion. - she will get copy of her recent tests results from [facility] - she will schedule appointment with cardiology - defer further diagnostics and therapeutics until the above are reviewed   Patient Instructions  Please get copy of your medical records from [facility] and drop off at our office.  Please also ask them to make a disc with your xrays and CT scans.  Follow up in 2 weeks with Dr. [name].    Chesley Mires, MD [facility]/Critical [facility]/Sleep Pager:  [phone] 07/04/2016, 1:29 PM

## 2016-07-06 ENCOUNTER — Telehealth: Payer: Self-pay | Admitting: Pulmonary Disease

## 2016-07-06 NOTE — Telephone Encounter (Addendum)
Pt came in to office to drop off records for Dr. [name] to review prior to her next appt. Pt has an upcoming back surgery and is needing surgery clearance. Pt stated Dr. [name] can send clearance/OV notes to Dr. Sherley Bounds. Records placed in Dr. Juanetta Gosling look at's.   Dr. [name] please advise. Thanks.

## 2016-07-07 DIAGNOSIS — R945 Abnormal results of liver function studies: Secondary | ICD-10-CM | POA: Diagnosis not present

## 2016-07-07 DIAGNOSIS — R7889 Finding of other specified substances, not normally found in blood: Secondary | ICD-10-CM | POA: Diagnosis not present

## 2016-07-10 NOTE — Telephone Encounter (Signed)
Routing to Katelyn [name] for follow up once VS is back in office.

## 2016-07-13 DIAGNOSIS — R7989 Other specified abnormal findings of blood chemistry: Secondary | ICD-10-CM | POA: Diagnosis not present

## 2016-07-18 ENCOUNTER — Encounter: Payer: Self-pay | Admitting: Pulmonary Disease

## 2016-07-18 ENCOUNTER — Ambulatory Visit (INDEPENDENT_AMBULATORY_CARE_PROVIDER_SITE_OTHER): Payer: PPO | Admitting: Pulmonary Disease

## 2016-07-18 VITALS — BP 108/78 | HR 87 | Ht 62.0 in | Wt 191.6 lb

## 2016-07-18 DIAGNOSIS — R0609 Other forms of dyspnea: Secondary | ICD-10-CM | POA: Diagnosis not present

## 2016-07-18 NOTE — Progress Notes (Signed)
Current Outpatient Prescriptions on File Prior to Visit  Medication Sig  . aspirin 81 MG tablet Take 81 mg by mouth at bedtime.   . cholecalciferol (VITAMIN D) 1000 units tablet Take 1,000 Units by mouth daily.  . DULoxetine (CYMBALTA) 60 MG capsule Take 60 mg by mouth every evening.   Marland Kitchen esomeprazole (NEXIUM) 20 MG capsule Take 20 mg by mouth 3 (three) times daily before meals.  . furosemide (LASIX) 20 MG tablet Take 20 mg by mouth daily.  Marland Kitchen gabapentin (NEURONTIN) 100 MG capsule Take 200 mg by mouth daily.  . insulin aspart (NOVOLOG) 100 UNIT/ML injection Inject 12.5-25 Units into the skin daily before breakfast.  . Insulin Glargine (TOUJEO SOLOSTAR) 300 UNIT/ML SOPN Inject into the skin as directed.  Marland Kitchen lisinopril-hydrochlorothiazide (PRINZIDE,ZESTORETIC) 20-12.5 MG per tablet Take 1 tablet by mouth daily after breakfast.  . Melatonin 300 MCG TABS Take 1 tablet by mouth at bedtime.  . mirtazapine (REMERON) 15 MG tablet Take 15 mg by mouth at bedtime.  . nitroGLYCERIN (NITROSTAT) 0.4 MG SL tablet   . oxyCODONE (OXY IR/ROXICODONE) 5 MG immediate release tablet Take 10 mg by mouth at bedtime.  . pramipexole (MIRAPEX) 0.25 MG tablet Take 0.5 mg by mouth every evening.   . ranitidine (ZANTAC) 150 MG tablet Take 150 mg by mouth at bedtime.   Current Facility-Administered Medications on File Prior to Visit  Medication  . triamcinolone acetonide (KENALOG) 10 MG/ML injection 10 mg    Chief Complaint  Patient presents with  . Follow-up    No change since last OV in dyspnea. Pt states that she is supposed to have back surgery in 4 weeks - Needs clearance.     Pulmonary tests V/Q scan 03/07/16 >> low probability for PE PFT 04/04/16 >> FEV1 1.77 (74%), FEV1% 87, TLC 3.69 (77%), DLCO 69%, no BD  Cardiac tests Doppler legs b/l 03/07/16 >> no DVT  Past medical history She  has a past medical history of Anemia; Anxiety; Arthritis; Bursitis of right hip; CAD (coronary artery disease); Complication  of anesthesia; Depression; Essential hypertension; GERD (gastroesophageal reflux disease); H/O hiatal hernia; Hypercholesterolemia; Left bundle branch block; Lumbar stenosis; Nephrolithiasis; PONV (postoperative nausea and vomiting); Restless leg syndrome; Sleep apnea; and Type 2 diabetes mellitus ([place]).  Vital signs BP 108/78 (BP Location: Left Arm, Cuff Size: Normal)   Pulse 87   Ht 5\' 2"  (1.575 m)   Wt 191 lb 9.6 oz (86.9 kg)   SpO2 96%   BMI 35.04 kg/m   History of present illness Katelyn [name] is a 60 y.o. female with progressive dyspnea.  Her breathing has been about the same.  She was seen by GI and started on antireflux therapy >> this has helped her bloating.  No new symptoms since last visit.  She was advised by cardiology that she could proceed with surgery.  Physical exam  General - No distress ENT - No sinus tenderness, no oral exudate, no LAN, no thyromegaly, TM clear, pupils equal/reactive Cardiac - s1s2 regular, no murmur, pulses symmetric Chest - No wheeze/rales/dullness, good air entry, normal respiratory excursion Back - No focal tenderness Abd - Soft, non-tender, no organomegaly, + bowel sounds Ext - No edema Neuro - Normal strength, cranial nerves intact Skin - No rashes Psych - Normal mood, and behavior  Assessment/plan  Dyspnea on exertion. - likely related to deconditioning - mild restriction on recent PFT likely related to obesity - no pulmonary contraindications for her to proceed with surgery with Dr.  Jones   Patient Instructions  Follow up in 6 months    Chesley Mires, MD [facility]/Critical Care/Sleep Pager:  [phone] 07/18/2016, 1:56 PM

## 2016-07-18 NOTE — Patient Instructions (Signed)
Follow up in 6 months 

## 2016-07-19 NOTE — Telephone Encounter (Signed)
Addressed at [facility] on 07/18/16.

## 2016-07-31 ENCOUNTER — Other Ambulatory Visit: Payer: Self-pay | Admitting: Neurological Surgery

## 2016-08-09 HISTORY — PX: LUMBAR DISC SURGERY: SHX700

## 2016-08-16 ENCOUNTER — Ambulatory Visit: Payer: PPO | Admitting: Pulmonary Disease

## 2016-08-21 ENCOUNTER — Encounter (HOSPITAL_COMMUNITY): Payer: Self-pay

## 2016-08-21 ENCOUNTER — Encounter (HOSPITAL_COMMUNITY)
Admission: RE | Admit: 2016-08-21 | Discharge: 2016-08-21 | Disposition: A | Discharge status: Home / Self Care | Payer: PPO | Source: Ambulatory Visit | Attending: Neurological Surgery | Admitting: Neurological Surgery

## 2016-08-21 DIAGNOSIS — M48061 Spinal stenosis, lumbar region without neurogenic claudication: Secondary | ICD-10-CM | POA: Insufficient documentation

## 2016-08-21 DIAGNOSIS — Z01818 Encounter for other preprocedural examination: Secondary | ICD-10-CM | POA: Diagnosis not present

## 2016-08-21 HISTORY — DX: Personal history of urinary calculi: Z87.442

## 2016-08-21 HISTORY — DX: Cardiac murmur, unspecified: R01.1

## 2016-08-21 LAB — BASIC METABOLIC PANEL
Anion gap: 8 (ref 5–15)
BUN: 13 mg/dL (ref 6–20)
CHLORIDE: 107 mmol/L (ref 101–111)
CO2: 26 mmol/L (ref 22–32)
CREATININE: 1.12 mg/dL — AB (ref 0.44–1.00)
Calcium: 8.8 mg/dL — ABNORMAL LOW (ref 8.9–10.3)
GFR calc Af Amer: >60 mL/min (ref >60)
GFR calc non Af Amer: 53 mL/min — ABNORMAL LOW (ref >60)
Glucose, Bld: 149 mg/dL — ABNORMAL HIGH (ref 65–99)
Potassium: 3.3 mmol/L — ABNORMAL LOW (ref 3.5–5.1)
Sodium: 141 mmol/L (ref 135–145)

## 2016-08-21 LAB — SURGICAL PCR SCREEN
MRSA, PCR: NEGATIVE
Staphylococcus aureus: NEGATIVE

## 2016-08-21 LAB — CBC WITH DIFFERENTIAL/PLATELET
BASOS PCT: 1 %
Basophils Absolute: 0.1 10*3/uL (ref 0.0–0.1)
EOS PCT: 6 %
Eosinophils Absolute: 0.5 10*3/uL (ref 0.0–0.7)
HEMATOCRIT: 33.7 % — AB (ref 36.0–46.0)
Hemoglobin: 11.2 g/dL — ABNORMAL LOW (ref 12.0–15.0)
LYMPHS ABS: 2.5 10*3/uL (ref 0.7–4.0)
Lymphocytes Relative: 30 %
MCH: 30.1 pg (ref 26.0–34.0)
MCHC: 33.2 g/dL (ref 30.0–36.0)
MCV: 90.6 fL (ref 78.0–100.0)
MONO ABS: 0.5 10*3/uL (ref 0.1–1.0)
Monocytes Relative: 6 %
NEUTROS PCT: 57 %
Neutro Abs: 4.6 10*3/uL (ref 1.7–7.7)
PLATELETS: 307 10*3/uL (ref 150–400)
RBC: 3.72 MIL/uL — ABNORMAL LOW (ref 3.87–5.11)
RDW: 13.6 % (ref 11.5–15.5)
WBC: 8.2 10*3/uL (ref 4.0–10.5)

## 2016-08-21 LAB — TYPE AND SCREEN
ABO/RH(D): A POS
ANTIBODY SCREEN: NEGATIVE

## 2016-08-21 LAB — GLUCOSE, CAPILLARY: GLUCOSE-CAPILLARY: 137 mg/dL — AB (ref 65–99)

## 2016-08-21 LAB — PROTIME-INR
INR: 0.95
Prothrombin Time: 12.7 seconds (ref 11.4–15.2)

## 2016-08-21 NOTE — Pre-Procedure Instructions (Addendum)
BOBETTA MATTHEW  08/21/2016      [facility] [address] [place] [id number] [place] [id number] Phone: [phone] Fax: [phone]    Your procedure is scheduled on 08/29/16.  Report to [facility] Admitting at 530 A.M.  Call this number if you have problems the morning of surgery:  [phone]   Remember:  Do not eat food or drink liquids after midnight.  Take these medicines the morning of surgery with A SIP OF WATER nexium, gabapentin,nitro if needed,inhaler if needed(bring),oxycodone  STOP all herbel meds, nsaids (aleve,naproxen,advil,ibuprofen) 7 days prior to surgery(08/22/16) including aspirin,excedrin,all vitamins     How to Manage Your Diabetes Before and After Surgery  Why is it important to control my blood sugar before and after surgery? . Improving blood sugar levels before and after surgery helps healing and can limit problems. . A way of improving blood sugar control is eating a healthy diet by: o  Eating less sugar and carbohydrates o  Increasing activity/exercise o  Talking with your doctor about reaching your blood sugar goals . High blood sugars (greater than 180 mg/dL) can raise your risk of infections and slow your recovery, so you will need to focus on controlling your diabetes during the weeks before surgery. . Make sure that the doctor who takes care of your diabetes knows about your planned surgery including the date and location.  How do I manage my blood sugar before surgery? . Check your blood sugar at least 4 times a day, starting 2 days before surgery, to make sure that the level is not too high or low. o Check your blood sugar the morning of your surgery when you wake up and every 2 hours until you get to the Short Stay unit. . If your blood sugar is less than 70 mg/dL, you will need to treat for low blood sugar: o Do not take insulin. o Treat a low blood sugar (less than 70 mg/dL) with   cup of clear juice (cranberry or apple), 4 glucose tablets, OR glucose gel. o Recheck blood sugar in 15 minutes after treatment (to make sure it is greater than 70 mg/dL). If your blood sugar is not greater than 70 mg/dL on recheck, call [phone] for further instructions. . Report your blood sugar to the short stay nurse when you get to Short Stay.  . If you are admitted to the hospital after surgery: o Your blood sugar will be checked by the staff and you will probably be given insulin after surgery (instead of oral diabetes medicines) to make sure you have good blood sugar levels. o The goal for blood sugar control after surgery is 80-180 mg/dL.   WHAT DO I DO ABOUT MY DIABETES MEDICATION?  Marland Kitchen Do not take oral diabetes medicines (pills) the morning of surgery.  . THE NIGHT BEFORE SURGERY, take   30units of  . levemir insulin. NO bedtime dose of novolog      . THE MORNING OF SURGERY, take no insulin. Except as instructed below   . If your CBG is greater than 220 mg/dL, you may take  of your sliding scale (correction) dose of insulin. (Novolog)    Do not wear jewelry, make-up or nail polish.  Do not wear lotions, powders, or perfumes, or deoderant.  Do not shave 48 hours prior to surgery.  Men may shave face and neck.  Do not bring valuables to the hospital.  [facility] is not responsible for any belongings or valuables.  Contacts, dentures or bridgework may not be worn into surgery.  Leave your suitcase in the car.  After surgery it may be brought to your room.  For patients admitted to the hospital, discharge time will be determined by your treatment team.  Patients discharged the day of surgery will not be allowed to drive home.     Special instructions:   Special Instructions:  - Preparing for Surgery  Before surgery, you can play an important role.  Because skin is not sterile, your skin needs to be as free of germs as possible.  You can reduce the number of  germs on you skin by washing with CHG (chlorahexidine gluconate) soap before surgery.  CHG is an antiseptic cleaner which kills germs and bonds with the skin to continue killing germs even after washing.  Please DO NOT use if you have an allergy to CHG or antibacterial soaps.  If your skin becomes reddened/irritated stop using the CHG and inform your nurse when you arrive at Short Stay.  Do not shave (including legs and underarms) for at least 48 hours prior to the first CHG shower.  You may shave your face.  Please follow these instructions carefully:   1.  Shower with CHG Soap the night before surgery and the morning of Surgery.  2.  If you choose to wash your hair, wash your hair first as usual with your normal shampoo.  3.  After you shampoo, rinse your hair and body thoroughly to remove the Shampoo.  4.  Use CHG as you would any other liquid soap.  You can apply chg directly  to the skin and wash gently with scrungie or a clean washcloth.  5.  Apply the CHG Soap to your body ONLY FROM THE NECK DOWN.  Do not use on open wounds or open sores.  Avoid contact with your eyes ears, mouth and genitals (private parts).  Wash genitals (private parts)       with your normal soap.  6.  Wash thoroughly, paying special attention to the area where your surgery will be performed.  7.  Thoroughly rinse your body with warm water from the neck down.  8.  DO NOT shower/wash with your normal soap after using and rinsing off the CHG Soap.  9.  Pat yourself dry with a clean towel.            10.  Wear clean pajamas.            11.  Place clean sheets on your bed the night of your first shower and do not sleep with pets.  Day of Surgery  Do not apply any lotions/deodorants the morning of surgery.  Please wear clean clothes to the hospital/surgery center.  Please read over the fact sheets that you were given.

## 2016-08-22 LAB — HEMOGLOBIN A1C
HEMOGLOBIN A1C: 8.7 % — AB (ref 4.8–5.6)
Mean Plasma Glucose: 203 mg/dL

## 2016-08-22 NOTE — Progress Notes (Signed)
Anesthesia Chart Review:  Pt is a 60 year old female scheduled for L2-3 posterior lateral fusion, L3-5 removal instrumentation, L2-3 pedicle screws, L2-3 laminectomy on 08/29/2016 with Sherley Bounds, MD.   - Cardiologist is Shirlee More, MD in [place], last office visit with Danie Binder, MD in that office on 02/29/16 (notes in care everywhere). Pt has cardiac clearance from Dr. Jimmie Molly for surgery.  - PCP is Teressa Lower, MD (notes in care everywhere) - Pulmonologist is Chesley Mires, MD who sees pt for progressive dyspnea. Cleared pt for surgery at last office visit 07/18/16.   PMH includes:  HTN, LBBB, OSA (mild, not on CPAP), DM, hyperlipidemia, anemia, GERD, post-op N/V. Never smoker. BMI 35.5. S/p lumbar microdiscectomy 11/26/15.   Medications include: ASA, pepcid, lasix, novolog, levemir, iron, lisinopril-hctz, metformin.  Preoperative labs reviewed.  Glucose 187  Chest x-ray 11/18/15 reviewed. No active cardiopulmonary disease.   Nuclear stress test 03/09/16:  1. No evidence of ischemia on the scan. 2. Normal EF 57%. 3. No significant symptoms, EKG changes, or arrhythmias occurred.  Echo 11/03/14 (correspondence 11/28/15 in media tab):  - Concentric hypertrophy of the left ventricle. Visual EF 50-55%. Doppler evidence of grade 1 (impaired) diastolic dysfunction.  - Left atrial cavity is mildly dilated.  Cardiac cath 04/03/13: Only lesion seen as in the mid circumflex-50% mid CX (67mm in length). Other vessels clear. Normal LV function, EF 60%, and EDP. Recommendations: Recommend workup for noncardiac causes of symptoms.  If no changes, I anticipate pt can proceed with surgery as scheduled.   Willeen Cass, FNP-BC [facility] Short Stay Surgical Center/Anesthesiology Phone: [phone] 08/22/2016 12:43 PM

## 2016-08-29 ENCOUNTER — Inpatient Hospital Stay (HOSPITAL_COMMUNITY): Payer: PPO | Admitting: Emergency Medicine

## 2016-08-29 ENCOUNTER — Inpatient Hospital Stay (HOSPITAL_COMMUNITY): Payer: PPO

## 2016-08-29 ENCOUNTER — Encounter (HOSPITAL_COMMUNITY): Payer: Self-pay | Admitting: *Deleted

## 2016-08-29 ENCOUNTER — Inpatient Hospital Stay (HOSPITAL_COMMUNITY): Payer: PPO | Admitting: Anesthesiology

## 2016-08-29 ENCOUNTER — Encounter (HOSPITAL_COMMUNITY)
Admission: RE | Disposition: A | Discharge status: Home / Self Care | Payer: Self-pay | Source: Ambulatory Visit | Attending: Neurological Surgery

## 2016-08-29 ENCOUNTER — Inpatient Hospital Stay (HOSPITAL_COMMUNITY)
Admission: RE | Admit: 2016-08-29 | Discharge: 2016-08-30 | DRG: 460 | Disposition: A | Discharge status: Home / Self Care | Payer: PPO | Source: Ambulatory Visit | Attending: Neurological Surgery | Admitting: Neurological Surgery

## 2016-08-29 DIAGNOSIS — Z87442 Personal history of urinary calculi: Secondary | ICD-10-CM

## 2016-08-29 DIAGNOSIS — E119 Type 2 diabetes mellitus without complications: Secondary | ICD-10-CM | POA: Diagnosis not present

## 2016-08-29 DIAGNOSIS — E7800 Pure hypercholesterolemia, unspecified: Secondary | ICD-10-CM | POA: Diagnosis present

## 2016-08-29 DIAGNOSIS — I25119 Atherosclerotic heart disease of native coronary artery with unspecified angina pectoris: Secondary | ICD-10-CM | POA: Diagnosis not present

## 2016-08-29 DIAGNOSIS — M7060 Trochanteric bursitis, unspecified hip: Secondary | ICD-10-CM | POA: Diagnosis not present

## 2016-08-29 DIAGNOSIS — M549 Dorsalgia, unspecified: Secondary | ICD-10-CM | POA: Diagnosis present

## 2016-08-29 DIAGNOSIS — Z7982 Long term (current) use of aspirin: Secondary | ICD-10-CM

## 2016-08-29 DIAGNOSIS — Z981 Arthrodesis status: Secondary | ICD-10-CM

## 2016-08-29 DIAGNOSIS — I10 Essential (primary) hypertension: Secondary | ICD-10-CM | POA: Diagnosis not present

## 2016-08-29 DIAGNOSIS — M48061 Spinal stenosis, lumbar region without neurogenic claudication: Principal | ICD-10-CM | POA: Diagnosis present

## 2016-08-29 DIAGNOSIS — M7071 Other bursitis of hip, right hip: Secondary | ICD-10-CM | POA: Diagnosis present

## 2016-08-29 DIAGNOSIS — Z801 Family history of malignant neoplasm of trachea, bronchus and lung: Secondary | ICD-10-CM | POA: Diagnosis not present

## 2016-08-29 DIAGNOSIS — Z823 Family history of stroke: Secondary | ICD-10-CM | POA: Diagnosis not present

## 2016-08-29 DIAGNOSIS — K219 Gastro-esophageal reflux disease without esophagitis: Secondary | ICD-10-CM | POA: Diagnosis present

## 2016-08-29 DIAGNOSIS — Z794 Long term (current) use of insulin: Secondary | ICD-10-CM | POA: Diagnosis not present

## 2016-08-29 DIAGNOSIS — M479 Spondylosis, unspecified: Secondary | ICD-10-CM | POA: Diagnosis present

## 2016-08-29 DIAGNOSIS — G4730 Sleep apnea, unspecified: Secondary | ICD-10-CM | POA: Diagnosis not present

## 2016-08-29 DIAGNOSIS — I447 Left bundle-branch block, unspecified: Secondary | ICD-10-CM | POA: Diagnosis present

## 2016-08-29 DIAGNOSIS — Z79899 Other long term (current) drug therapy: Secondary | ICD-10-CM

## 2016-08-29 DIAGNOSIS — F329 Major depressive disorder, single episode, unspecified: Secondary | ICD-10-CM | POA: Diagnosis present

## 2016-08-29 DIAGNOSIS — Z808 Family history of malignant neoplasm of other organs or systems: Secondary | ICD-10-CM | POA: Diagnosis not present

## 2016-08-29 DIAGNOSIS — Z9049 Acquired absence of other specified parts of digestive tract: Secondary | ICD-10-CM | POA: Diagnosis not present

## 2016-08-29 DIAGNOSIS — K449 Diaphragmatic hernia without obstruction or gangrene: Secondary | ICD-10-CM | POA: Diagnosis present

## 2016-08-29 DIAGNOSIS — F419 Anxiety disorder, unspecified: Secondary | ICD-10-CM | POA: Diagnosis present

## 2016-08-29 DIAGNOSIS — M7630 Iliotibial band syndrome, unspecified leg: Secondary | ICD-10-CM | POA: Diagnosis not present

## 2016-08-29 DIAGNOSIS — G2581 Restless legs syndrome: Secondary | ICD-10-CM | POA: Diagnosis not present

## 2016-08-29 DIAGNOSIS — I2510 Atherosclerotic heart disease of native coronary artery without angina pectoris: Secondary | ICD-10-CM | POA: Diagnosis not present

## 2016-08-29 DIAGNOSIS — Z888 Allergy status to other drugs, medicaments and biological substances status: Secondary | ICD-10-CM | POA: Diagnosis not present

## 2016-08-29 DIAGNOSIS — M5136 Other intervertebral disc degeneration, lumbar region: Secondary | ICD-10-CM | POA: Diagnosis not present

## 2016-08-29 DIAGNOSIS — Z8249 Family history of ischemic heart disease and other diseases of the circulatory system: Secondary | ICD-10-CM | POA: Diagnosis not present

## 2016-08-29 DIAGNOSIS — M47816 Spondylosis without myelopathy or radiculopathy, lumbar region: Secondary | ICD-10-CM | POA: Diagnosis not present

## 2016-08-29 DIAGNOSIS — R011 Cardiac murmur, unspecified: Secondary | ICD-10-CM | POA: Diagnosis present

## 2016-08-29 DIAGNOSIS — M4326 Fusion of spine, lumbar region: Secondary | ICD-10-CM | POA: Diagnosis not present

## 2016-08-29 HISTORY — DX: Arthrodesis status: Z98.1

## 2016-08-29 LAB — GLUCOSE, CAPILLARY
GLUCOSE-CAPILLARY: 132 mg/dL — AB (ref 65–99)
GLUCOSE-CAPILLARY: 293 mg/dL — AB (ref 65–99)
Glucose-Capillary: 154 mg/dL — ABNORMAL HIGH (ref 65–99)
Glucose-Capillary: 271 mg/dL — ABNORMAL HIGH (ref 65–99)

## 2016-08-29 SURGERY — POSTERIOR LUMBAR FUSION 1 WITH HARDWARE REMOVAL
Anesthesia: General | Site: Back

## 2016-08-29 MED ORDER — MENTHOL 3 MG MT LOZG
1.0000 | LOZENGE | OROMUCOSAL | Status: DC | PRN
  Filled 2016-08-29: qty 9

## 2016-08-29 MED ORDER — HYDROCHLOROTHIAZIDE 12.5 MG PO CAPS: 12.5000 mg | ORAL_CAPSULE | Freq: Every day | ORAL | Status: DC

## 2016-08-29 MED ORDER — SODIUM CHLORIDE 0.9 % IR SOLN
Status: DC | PRN
  Administered 2016-08-29: 09:00:00

## 2016-08-29 MED ORDER — BUPIVACAINE HCL (PF) 0.25 % IJ SOLN
INTRAMUSCULAR | Status: AC
  Filled 2016-08-29: qty 30

## 2016-08-29 MED ORDER — PRAMIPEXOLE DIHYDROCHLORIDE 0.25 MG PO TABS
0.5000 mg | ORAL_TABLET | Freq: Every day | ORAL | Status: DC
  Administered 2016-08-29: 0.5 mg via ORAL
  Filled 2016-08-29: qty 2

## 2016-08-29 MED ORDER — FENTANYL CITRATE (PF) 100 MCG/2ML IJ SOLN
INTRAMUSCULAR | Status: DC | PRN
  Administered 2016-08-29: 100 ug via INTRAVENOUS
  Administered 2016-08-29 (×2): 50 ug via INTRAVENOUS

## 2016-08-29 MED ORDER — CEFAZOLIN IN D5W 1 GM/50ML IV SOLN
1.0000 g | Freq: Three times a day (TID) | INTRAVENOUS | Status: AC
  Administered 2016-08-29 (×2): 1 g via INTRAVENOUS
  Filled 2016-08-29 (×2): qty 50

## 2016-08-29 MED ORDER — CEFAZOLIN SODIUM-DEXTROSE 2-4 GM/100ML-% IV SOLN
2.0000 g | INTRAVENOUS | Status: AC
  Administered 2016-08-29: 2 g via INTRAVENOUS
  Filled 2016-08-29: qty 100

## 2016-08-29 MED ORDER — PROPOFOL 10 MG/ML IV BOLUS
INTRAVENOUS | Status: DC | PRN
  Administered 2016-08-29: 200 mg via INTRAVENOUS

## 2016-08-29 MED ORDER — PROPOFOL 10 MG/ML IV BOLUS
INTRAVENOUS | Status: AC
  Filled 2016-08-29: qty 20

## 2016-08-29 MED ORDER — PROMETHAZINE HCL 25 MG/ML IJ SOLN: 6.2500 mg | INTRAMUSCULAR | Status: DC | PRN

## 2016-08-29 MED ORDER — NITROGLYCERIN 0.4 MG SL SUBL
0.4000 mg | SUBLINGUAL_TABLET | SUBLINGUAL | Status: DC | PRN
Start: 2016-08-29 — End: 2016-08-30

## 2016-08-29 MED ORDER — POTASSIUM CHLORIDE IN NACL 20-0.9 MEQ/L-% IV SOLN: INTRAVENOUS | Status: DC

## 2016-08-29 MED ORDER — MIDAZOLAM HCL 2 MG/2ML IJ SOLN
INTRAMUSCULAR | Status: AC
  Filled 2016-08-29: qty 2

## 2016-08-29 MED ORDER — ONDANSETRON HCL 4 MG/2ML IJ SOLN
4.0000 mg | INTRAMUSCULAR | Status: DC | PRN
Start: 2016-08-29 — End: 2016-08-30

## 2016-08-29 MED ORDER — MORPHINE SULFATE (PF) 4 MG/ML IV SOLN
INTRAVENOUS | Status: AC
  Filled 2016-08-29: qty 1

## 2016-08-29 MED ORDER — THROMBIN 5000 UNITS EX SOLR
CUTANEOUS | Status: AC
  Filled 2016-08-29: qty 5000

## 2016-08-29 MED ORDER — SODIUM CHLORIDE 0.9% FLUSH: 3.0000 mL | INTRAVENOUS | Status: DC | PRN

## 2016-08-29 MED ORDER — ACETAMINOPHEN 325 MG PO TABS: 650.0000 mg | ORAL_TABLET | ORAL | Status: DC | PRN

## 2016-08-29 MED ORDER — ACETAMINOPHEN 650 MG RE SUPP: 650.0000 mg | RECTAL | Status: DC | PRN

## 2016-08-29 MED ORDER — LISINOPRIL 20 MG PO TABS: 20.0000 mg | ORAL_TABLET | Freq: Every day | ORAL | Status: DC

## 2016-08-29 MED ORDER — MIRTAZAPINE 15 MG PO TABS
15.0000 mg | ORAL_TABLET | Freq: Every day | ORAL | Status: DC
  Administered 2016-08-29: 15 mg via ORAL
  Filled 2016-08-29: qty 1

## 2016-08-29 MED ORDER — OXYCODONE HCL 5 MG/5ML PO SOLN: 5.0000 mg | Freq: Once | ORAL | Status: DC | PRN

## 2016-08-29 MED ORDER — ROCURONIUM BROMIDE 100 MG/10ML IV SOLN
INTRAVENOUS | Status: DC | PRN
  Administered 2016-08-29: 50 mg via INTRAVENOUS

## 2016-08-29 MED ORDER — SUGAMMADEX SODIUM 500 MG/5ML IV SOLN
INTRAVENOUS | Status: DC | PRN
  Administered 2016-08-29: 200 mg via INTRAVENOUS

## 2016-08-29 MED ORDER — DULOXETINE HCL 30 MG PO CPEP
60.0000 mg | ORAL_CAPSULE | Freq: Every evening | ORAL | Status: DC
  Administered 2016-08-29: 60 mg via ORAL
  Filled 2016-08-29 (×2): qty 2

## 2016-08-29 MED ORDER — OXYCODONE HCL 5 MG PO TABS: 5.0000 mg | ORAL_TABLET | Freq: Once | ORAL | Status: DC | PRN

## 2016-08-29 MED ORDER — CELECOXIB 200 MG PO CAPS
200.0000 mg | ORAL_CAPSULE | Freq: Two times a day (BID) | ORAL | Status: DC
  Administered 2016-08-29 – 2016-08-30 (×3): 200 mg via ORAL
  Filled 2016-08-29 (×3): qty 1

## 2016-08-29 MED ORDER — ALBUTEROL SULFATE (2.5 MG/3ML) 0.083% IN NEBU: 3.0000 mL | INHALATION_SOLUTION | Freq: Four times a day (QID) | RESPIRATORY_TRACT | Status: DC | PRN

## 2016-08-29 MED ORDER — CHLORHEXIDINE GLUCONATE CLOTH 2 % EX PADS: 6.0000 | MEDICATED_PAD | Freq: Once | CUTANEOUS | Status: DC

## 2016-08-29 MED ORDER — EPHEDRINE SULFATE 50 MG/ML IJ SOLN
INTRAMUSCULAR | Status: DC | PRN
  Administered 2016-08-29 (×2): 10 mg via INTRAVENOUS

## 2016-08-29 MED ORDER — GABAPENTIN 100 MG PO CAPS
100.0000 mg | ORAL_CAPSULE | Freq: Two times a day (BID) | ORAL | Status: DC
  Administered 2016-08-29 – 2016-08-30 (×2): 100 mg via ORAL
  Filled 2016-08-29 (×2): qty 1

## 2016-08-29 MED ORDER — METHOCARBAMOL 500 MG PO TABS
500.0000 mg | ORAL_TABLET | Freq: Four times a day (QID) | ORAL | Status: DC | PRN
  Administered 2016-08-29 – 2016-08-30 (×2): 500 mg via ORAL
  Filled 2016-08-29 (×2): qty 1

## 2016-08-29 MED ORDER — VANCOMYCIN HCL 1000 MG IV SOLR
INTRAVENOUS | Status: AC
  Filled 2016-08-29: qty 1000

## 2016-08-29 MED ORDER — THROMBIN 20000 UNITS EX SOLR
CUTANEOUS | Status: DC | PRN
  Administered 2016-08-29: 09:00:00 via TOPICAL

## 2016-08-29 MED ORDER — PHENYLEPHRINE HCL 10 MG/ML IJ SOLN
INTRAMUSCULAR | Status: DC | PRN
  Administered 2016-08-29: 100 ug/min via INTRAVENOUS

## 2016-08-29 MED ORDER — OXYCODONE-ACETAMINOPHEN 5-325 MG PO TABS
1.0000 | ORAL_TABLET | ORAL | Status: DC | PRN
  Administered 2016-08-29 – 2016-08-30 (×4): 2 via ORAL
  Filled 2016-08-29 (×5): qty 2

## 2016-08-29 MED ORDER — FENTANYL CITRATE (PF) 100 MCG/2ML IJ SOLN
INTRAMUSCULAR | Status: AC
  Filled 2016-08-29: qty 2

## 2016-08-29 MED ORDER — THROMBIN 5000 UNITS EX SOLR
OROMUCOSAL | Status: DC | PRN
  Administered 2016-08-29: 09:00:00 via TOPICAL

## 2016-08-29 MED ORDER — LIDOCAINE HCL (CARDIAC) 20 MG/ML IV SOLN
INTRAVENOUS | Status: DC | PRN
  Administered 2016-08-29: 50 mg via INTRAVENOUS

## 2016-08-29 MED ORDER — LACTATED RINGERS IV SOLN
INTRAVENOUS | Status: DC | PRN
  Administered 2016-08-29 (×2): via INTRAVENOUS

## 2016-08-29 MED ORDER — ONDANSETRON HCL 4 MG/2ML IJ SOLN
INTRAMUSCULAR | Status: DC | PRN
  Administered 2016-08-29: 4 mg via INTRAVENOUS

## 2016-08-29 MED ORDER — PHENYLEPHRINE HCL 10 MG/ML IJ SOLN
INTRAMUSCULAR | Status: DC | PRN
  Administered 2016-08-29: 80 ug via INTRAVENOUS
  Administered 2016-08-29: 120 ug via INTRAVENOUS
  Administered 2016-08-29: 160 ug via INTRAVENOUS

## 2016-08-29 MED ORDER — MIDAZOLAM HCL 5 MG/5ML IJ SOLN
INTRAMUSCULAR | Status: DC | PRN
  Administered 2016-08-29: 2 mg via INTRAVENOUS

## 2016-08-29 MED ORDER — THROMBIN 20000 UNITS EX SOLR
CUTANEOUS | Status: AC
  Filled 2016-08-29: qty 20000

## 2016-08-29 MED ORDER — FUROSEMIDE 20 MG PO TABS: 20.0000 mg | ORAL_TABLET | Freq: Every day | ORAL | Status: DC

## 2016-08-29 MED ORDER — SUCCINYLCHOLINE CHLORIDE 20 MG/ML IJ SOLN
INTRAMUSCULAR | Status: DC | PRN
  Administered 2016-08-29: 120 mg via INTRAVENOUS

## 2016-08-29 MED ORDER — ACETAMINOPHEN 10 MG/ML IV SOLN
INTRAVENOUS | Status: AC
  Filled 2016-08-29: qty 100

## 2016-08-29 MED ORDER — BUPIVACAINE HCL (PF) 0.25 % IJ SOLN
INTRAMUSCULAR | Status: DC | PRN
  Administered 2016-08-29: 3 mL via PERINEURAL

## 2016-08-29 MED ORDER — 0.9 % SODIUM CHLORIDE (POUR BTL) OPTIME
TOPICAL | Status: DC | PRN
  Administered 2016-08-29: 1000 mL

## 2016-08-29 MED ORDER — DEXAMETHASONE SODIUM PHOSPHATE 10 MG/ML IJ SOLN
10.0000 mg | INTRAMUSCULAR | Status: AC
  Administered 2016-08-29: 10 mg via INTRAVENOUS
  Filled 2016-08-29: qty 1

## 2016-08-29 MED ORDER — SODIUM CHLORIDE 0.9% FLUSH: 3.0000 mL | Freq: Two times a day (BID) | INTRAVENOUS | Status: DC

## 2016-08-29 MED ORDER — ACETAMINOPHEN 10 MG/ML IV SOLN
INTRAVENOUS | Status: DC | PRN
  Administered 2016-08-29: 1000 mg via INTRAVENOUS

## 2016-08-29 MED ORDER — ARTIFICIAL TEARS OP OINT
TOPICAL_OINTMENT | OPHTHALMIC | Status: DC | PRN
  Administered 2016-08-29: 1 via OPHTHALMIC

## 2016-08-29 MED ORDER — MORPHINE SULFATE (PF) 4 MG/ML IV SOLN
1.0000 mg | INTRAVENOUS | Status: DC | PRN
  Administered 2016-08-29 (×2): 4 mg via INTRAVENOUS
  Filled 2016-08-29: qty 1

## 2016-08-29 MED ORDER — FENTANYL CITRATE (PF) 100 MCG/2ML IJ SOLN
25.0000 ug | INTRAMUSCULAR | Status: DC | PRN
  Administered 2016-08-29 (×2): 50 ug via INTRAVENOUS

## 2016-08-29 MED ORDER — LISINOPRIL-HYDROCHLOROTHIAZIDE 20-12.5 MG PO TABS: 1.0000 | ORAL_TABLET | Freq: Every day | ORAL | Status: DC

## 2016-08-29 MED ORDER — METHOCARBAMOL 1000 MG/10ML IJ SOLN
500.0000 mg | Freq: Four times a day (QID) | INTRAVENOUS | Status: DC | PRN
  Filled 2016-08-29: qty 5

## 2016-08-29 MED ORDER — PHENOL 1.4 % MT LIQD
1.0000 | OROMUCOSAL | Status: DC | PRN
Start: 2016-08-29 — End: 2016-08-30

## 2016-08-29 MED ORDER — SODIUM CHLORIDE 0.9 % IV SOLN: 250.0000 mL | INTRAVENOUS | Status: DC

## 2016-08-29 MED ORDER — VANCOMYCIN HCL 1000 MG IV SOLR
INTRAVENOUS | Status: DC | PRN
  Administered 2016-08-29: 1000 mg via TOPICAL

## 2016-08-29 MED ORDER — SCOPOLAMINE 1 MG/3DAYS TD PT72
MEDICATED_PATCH | TRANSDERMAL | Status: AC
  Filled 2016-08-29: qty 1

## 2016-08-29 SURGICAL SUPPLY — 62 items
BAG DECANTER FOR FLEXI CONT (MISCELLANEOUS) ×3 IMPLANT
BASKET BONE COLLECTION (BASKET) IMPLANT
BENZOIN TINCTURE PRP APPL 2/3 (GAUZE/BANDAGES/DRESSINGS) ×3 IMPLANT
BLADE CLIPPER SURG (BLADE) IMPLANT
BONE CANC CHIPS 20CC PCAN1/4 (Bone Implant) ×3 IMPLANT
BONE EQUIVA 10CC (Bone Implant) ×3 IMPLANT
BUR MATCHSTICK NEURO 3.0 LAGG (BURR) ×3 IMPLANT
CANISTER SUCT 3000ML PPV (MISCELLANEOUS) ×3 IMPLANT
CARTRIDGE OIL MAESTRO DRILL (MISCELLANEOUS) ×1 IMPLANT
CHIPS CANC BONE 20CC PCAN1/4 (Bone Implant) ×1 IMPLANT
CLOSURE WOUND 1/2 X4 (GAUZE/BANDAGES/DRESSINGS) ×2
CONT SPEC 4OZ CLIKSEAL STRL BL (MISCELLANEOUS) ×3 IMPLANT
COVER BACK TABLE 60X90IN (DRAPES) ×3 IMPLANT
DERMABOND ADVANCED (GAUZE/BANDAGES/DRESSINGS) ×2
DERMABOND ADVANCED .7 DNX12 (GAUZE/BANDAGES/DRESSINGS) ×1 IMPLANT
DIFFUSER DRILL AIR PNEUMATIC (MISCELLANEOUS) ×3 IMPLANT
DRAPE C-ARM 42X72 X-RAY (DRAPES) ×3 IMPLANT
DRAPE C-ARMOR (DRAPES) ×3 IMPLANT
DRAPE LAPAROTOMY 100X72X124 (DRAPES) ×3 IMPLANT
DRAPE POUCH INSTRU U-SHP 10X18 (DRAPES) ×3 IMPLANT
DRAPE SURG 17X23 STRL (DRAPES) ×3 IMPLANT
DRSG OPSITE POSTOP 4X6 (GAUZE/BANDAGES/DRESSINGS) ×3 IMPLANT
DURAPREP 26ML APPLICATOR (WOUND CARE) ×3 IMPLANT
ELECT REM PT RETURN 9FT ADLT (ELECTROSURGICAL) ×3
ELECTRODE REM PT RTRN 9FT ADLT (ELECTROSURGICAL) ×1 IMPLANT
EVACUATOR 1/8 PVC DRAIN (DRAIN) IMPLANT
GAUZE SPONGE 4X4 16PLY XRAY LF (GAUZE/BANDAGES/DRESSINGS) IMPLANT
GLOVE BIO SURGEON STRL SZ8 (GLOVE) ×6 IMPLANT
GLOVE BIOGEL PI IND STRL 6.5 (GLOVE) ×1 IMPLANT
GLOVE BIOGEL PI IND STRL 7.5 (GLOVE) ×1 IMPLANT
GLOVE BIOGEL PI INDICATOR 6.5 (GLOVE) ×2
GLOVE BIOGEL PI INDICATOR 7.5 (GLOVE) ×2
GLOVE SS N UNI LF 7.5 STRL (GLOVE) ×3 IMPLANT
GOWN STRL REUS W/ TWL LRG LVL3 (GOWN DISPOSABLE) IMPLANT
GOWN STRL REUS W/ TWL XL LVL3 (GOWN DISPOSABLE) ×2 IMPLANT
GOWN STRL REUS W/TWL 2XL LVL3 (GOWN DISPOSABLE) IMPLANT
GOWN STRL REUS W/TWL LRG LVL3 (GOWN DISPOSABLE)
GOWN STRL REUS W/TWL XL LVL3 (GOWN DISPOSABLE) ×4
HEMOSTAT POWDER KIT SURGIFOAM (HEMOSTASIS) ×3 IMPLANT
KIT BASIN OR (CUSTOM PROCEDURE TRAY) ×3 IMPLANT
KIT ROOM TURNOVER OR (KITS) ×3 IMPLANT
MILL MEDIUM DISP (BLADE) ×3 IMPLANT
NEEDLE HYPO 25X1 1.5 SAFETY (NEEDLE) ×3 IMPLANT
NS IRRIG 1000ML POUR BTL (IV SOLUTION) ×3 IMPLANT
OIL CARTRIDGE MAESTRO DRILL (MISCELLANEOUS) ×3
PACK LAMINECTOMY NEURO (CUSTOM PROCEDURE TRAY) ×3 IMPLANT
PAD ARMBOARD 7.5X6 YLW CONV (MISCELLANEOUS) ×12 IMPLANT
ROD BENT 40MM (Rod) ×3 IMPLANT
ROD PREBENT 45MM (Rod) ×3 IMPLANT
SCREW SEQUOIA 5.5X40MM (Screw) ×6 IMPLANT
SPONGE LAP 4X18 X RAY DECT (DISPOSABLE) IMPLANT
SPONGE SURGIFOAM ABS GEL 100 (HEMOSTASIS) ×3 IMPLANT
STRIP CLOSURE SKIN 1/2X4 (GAUZE/BANDAGES/DRESSINGS) ×4 IMPLANT
SUT VIC AB 0 CT1 18XCR BRD8 (SUTURE) ×1 IMPLANT
SUT VIC AB 0 CT1 8-18 (SUTURE) ×2
SUT VIC AB 2-0 CP2 18 (SUTURE) ×3 IMPLANT
SUT VIC AB 3-0 SH 8-18 (SUTURE) ×9 IMPLANT
TOP CLSR SEQUOIA (Orthopedic Implant) ×12 IMPLANT
TOWEL OR 17X24 6PK STRL BLUE (TOWEL DISPOSABLE) ×3 IMPLANT
TOWEL OR 17X26 10 PK STRL BLUE (TOWEL DISPOSABLE) ×3 IMPLANT
TRAY FOLEY W/METER SILVER 16FR (SET/KITS/TRAYS/PACK) ×3 IMPLANT
WATER STERILE IRR 1000ML POUR (IV SOLUTION) ×3 IMPLANT

## 2016-08-29 NOTE — Transfer of Care (Signed)
Immediate Anesthesia Transfer of Care Note  Patient: Katelyn [name]  Procedure(s) Performed: Procedure(s) with comments: Posterior Lateral Fusion - Lumbar two - Lumber three with instrumentation , removal instrumentation Lumbar three - lumbar five (N/A) - Posterior Lateral Fusion - Lumbar two - Lumber three with instrumentation , removal instrumentation Lumbar three - lumbar five  Patient Location: PACU  Anesthesia Type:General  Level of Consciousness: sedated  Airway & Oxygen Therapy: Patient Spontanous Breathing and Patient connected to nasal cannula oxygen  Post-op Assessment: Report given to RN, Post -op Vital signs reviewed and stable and Patient moving all extremities X 4  Post vital signs: Reviewed and stable  Last Vitals:  Vitals:   08/29/16 0548  BP: (!) 147/65  Pulse: 74  Resp: 20  Temp: 36.9 C    Last Pain:  Vitals:   08/29/16 0548  TempSrc: Oral      Patients Stated Pain Goal: 5 ([id number])  Complications: No apparent anesthesia complications

## 2016-08-29 NOTE — Anesthesia Postprocedure Evaluation (Signed)
Anesthesia Post Note  Patient: Katelyn [name]  Procedure(s) Performed: Procedure(s) (LRB): Posterior Lateral Fusion - Lumbar two - Lumber three with instrumentation , removal instrumentation Lumbar three - lumbar five (N/A)  Patient location during evaluation: PACU Anesthesia Type: General Level of consciousness: awake and alert Pain management: pain level controlled Vital Signs Assessment: post-procedure vital signs reviewed and stable Respiratory status: spontaneous breathing, nonlabored ventilation, respiratory function stable and patient connected to nasal cannula oxygen Cardiovascular status: blood pressure returned to baseline and stable Postop Assessment: no signs of nausea or vomiting Anesthetic complications: no    Last Vitals:  Vitals:   08/29/16 1115 08/29/16 1145  BP:  133/60  Pulse: 72 76  Resp: 10 18  Temp: 36.8 C 37.1 C    Last Pain:  Vitals:   08/29/16 1210  TempSrc:   PainSc: [name]

## 2016-08-29 NOTE — Anesthesia Preprocedure Evaluation (Addendum)
Anesthesia Evaluation  Patient identified by MRN, date of birth, ID band Patient awake    Reviewed: Allergy & Precautions, H&P , NPO status , Patient's Chart, lab work & pertinent test results  History of Anesthesia Complications (+) PONV and history of anesthetic complications  Airway Mallampati: I  TM Distance: >3 FB Neck ROM: full    Dental  (+) Teeth Intact, Dental Advidsory Given   Pulmonary sleep apnea ,    Pulmonary exam normal        Cardiovascular hypertension, Pt. on medications + CAD  Normal cardiovascular exam(-) dysrhythmias (-) Valvular Problems/Murmurs     Neuro/Psych PSYCHIATRIC DISORDERS Depression    GI/Hepatic hiatal hernia, GERD  Medicated,  Endo/Other  diabetes  Renal/GU CRFRenal disease     Musculoskeletal   Abdominal   Peds  Hematology  (+) anemia ,   Anesthesia Other Findings   Reproductive/Obstetrics                            Anesthesia Physical Anesthesia Plan  ASA: III  Anesthesia Plan: General   Post-op Pain Management:    Induction: Intravenous  Airway Management Planned: Oral ETT  Additional Equipment:   Intra-op Plan:   Post-operative Plan: Extubation in OR  Informed Consent: I have reviewed the patients History and Physical, chart, labs and discussed the procedure including the risks, benefits and alternatives for the proposed anesthesia with the patient or authorized representative who has indicated his/her understanding and acceptance.   Dental advisory given  Plan Discussed with: Anesthesiologist, CRNA and Surgeon  Anesthesia Plan Comments:        Anesthesia Quick Evaluation

## 2016-08-29 NOTE — Anesthesia Procedure Notes (Signed)
Procedure Name: Intubation Date/Time: 08/29/2016 7:36 AM Performed by: Neldon Newport Pre-anesthesia Checklist: Timeout performed, Patient being monitored, Suction available, Emergency Drugs available and Patient identified Patient Re-evaluated:Patient Re-evaluated prior to inductionOxygen Delivery Method: Circle system utilized Preoxygenation: Pre-oxygenation with 100% oxygen Intubation Type: IV induction Ventilation: Mask ventilation without difficulty Laryngoscope Size: Mac and 3 Grade View: Grade II Tube type: Oral Tube size: 7.0 mm Number of attempts: 1 Placement Confirmation: breath sounds checked- equal and bilateral,  positive ETCO2 and ETT inserted through vocal cords under direct vision Secured at: 21 cm Tube secured with: Tape Dental Injury: Teeth and Oropharynx as per pre-operative assessment

## 2016-08-29 NOTE — H&P (Signed)
Subjective: Patient is a 60 y.o. female admitted for back pain. Onset of symptoms was several months ago, gradually worsening since that time.  The pain is rated severe, and is located at the across the lower back and radiates to legs. The pain is described as aching and occurs all day. The symptoms have been progressive. Symptoms are exacerbated by nothing in particular. MRI or CT showed DDD/ Stenosis L2-3   Past Medical History:  Diagnosis Date  . Anemia    previous transfusion 2/17  . Anxiety   . Arthritis   . Bursitis of right hip   . CAD (coronary artery disease)    Cardiac catheterization June 2014 in [facility] - 50% circumflex stenosis  . Complication of anesthesia   . Depression   . Essential hypertension   . GERD (gastroesophageal reflux disease)   . H/O hiatal hernia   . Heart murmur   . History of kidney stones   . Hypercholesterolemia   . Left bundle branch block   . Lumbar stenosis   . PONV (postoperative nausea and vomiting)   . Restless leg syndrome   . Sleep apnea    mild case  no cpap  . Type 2 diabetes mellitus ([place])     Past Surgical History:  Procedure Laterality Date  . ABDOMINAL HYSTERECTOMY  1983  . APPENDECTOMY  Age 34  . BACK SURGERY     FIRST LUMBAR FUSION/ SURGERY APRIL 2012 AND FUSION WITH INSTRUMENTATION SEPT 2012  . CARPAL TUNNEL RELEASE     bil  . CHOLECYSTECTOMY  1990's  . CYSTO EXTRACTION KIDNEY STONES    . EXCISION/RELEASE BURSA HIP  02/02/2012   Procedure: EXCISION/RELEASE BURSA HIP;  Surgeon: Tobi Bastos, MD;  Location: [facility];  Service: Orthopedics;  Laterality: Right;  Right Hip Bursectomy  . EYE SURGERY Bilateral    cataracts  . KIDNEY STONE SURGERY  2008  . Left knee surgery x 2  1996   reconstruction  . LUMBAR LAMINECTOMY/DECOMPRESSION MICRODISCECTOMY Right 11/26/2015   Procedure: Extraforaminal Microdiscectomy  - Lumbar two-three- right;  Surgeon: Eustace Moore, MD;  Location: [place] [facility];  Service: Neurosurgery;   Laterality: Right;  right   . Right shoulder surgery  2010   spur    Prior to Admission medications   Medication Sig Start Date End Date Taking? Authorizing Provider  aspirin 81 MG tablet Take 81 mg by mouth at bedtime.    Yes Historical Provider, MD  aspirin-acetaminophen-caffeine (EXCEDRIN MIGRAINE) [phone] MG tablet Take 2 tablets by mouth every 6 (six) hours as needed for headache.   Yes Historical Provider, MD  cholecalciferol (VITAMIN D) 1000 units tablet Take 1,000 Units by mouth daily.   Yes Historical Provider, MD  DULoxetine (CYMBALTA) 60 MG capsule Take 60 mg by mouth every evening.    Yes Historical Provider, MD  esomeprazole (NEXIUM) 20 MG capsule Take 20 mg by mouth 3 (three) times daily before meals.   Yes Historical Provider, MD  furosemide (LASIX) 20 MG tablet Take 20 mg by mouth daily.   Yes Historical Provider, MD  gabapentin (NEURONTIN) 100 MG capsule Take 100 mg by mouth 2 (two) times daily.    Yes Historical Provider, MD  insulin aspart (NOVOLOG) 100 UNIT/ML injection Inject 25-35 Units into the skin 3 (three) times daily before meals. If blood sugar is over 200 inject 25 units, if BS is over 300 inject 30 units, if BS is over 400 inject 35 units.   Yes Historical  Provider, MD  insulin detemir (LEVEMIR) 100 UNIT/ML injection Inject 60 Units into the skin at bedtime.   Yes Historical Provider, MD  Insulin Glargine (TOUJEO SOLOSTAR) 300 UNIT/ML SOPN Inject 60 Units into the skin at bedtime.    Yes Historical Provider, MD  lisinopril-hydrochlorothiazide (PRINZIDE,ZESTORETIC) 20-12.5 MG per tablet Take 1 tablet by mouth daily after breakfast.   Yes Historical Provider, MD  mirtazapine (REMERON) 15 MG tablet Take 15 mg by mouth at bedtime.   Yes Historical Provider, MD  naproxen sodium (ANAPROX) 220 MG tablet Take 220 mg by mouth 2 (two) times daily as needed (pain).   Yes Historical Provider, MD  oxyCODONE (ROXICODONE) 15 MG immediate release tablet Take 15 mg by mouth at  bedtime.   Yes Historical Provider, MD  pramipexole (MIRAPEX) 0.5 MG tablet Take 0.5 mg by mouth at bedtime.  07/23/16  Yes Historical Provider, MD  PROAIR RESPICLICK 123XX123 (90 Base) MCG/ACT AEPB Inhale 1 puff into the lungs every 6 (six) hours as needed (shortness of breath).  05/24/16  Yes Historical Provider, MD  ranitidine (ZANTAC) 150 MG tablet Take 150 mg by mouth at bedtime.   Yes Historical Provider, MD  trolamine salicylate (ASPERCREME) 10 % cream Apply 1 application topically as needed for muscle pain.   Yes Historical Provider, MD  nitroGLYCERIN (NITROSTAT) 0.4 MG SL tablet Place 0.4 mg under the tongue every 5 (five) minutes as needed for chest pain.     Historical Provider, MD   Allergies  Allergen Reactions  . Atorvastatin Nausea And Vomiting and Other (See Comments)    MYALGIAS  . Talwin [Pentazocine] Other (See Comments)    headache  . Other Nausea Only    UNSPECIFIED Anesthesia    Social History  Substance Use Topics  . Smoking status: Never Smoker  . Smokeless tobacco: Never Used  . Alcohol use 0.0 oz/week     Comment: Rarely    Family History  Problem Relation Age of Onset  . Lung cancer Father     smoked  . Hypertension Father   . Stroke Father   . Heart failure Mother   . Bone cancer Sister   . Asthma Sister      Review of Systems  Positive ROS: neg  All other systems have been reviewed and were otherwise negative with the exception of those mentioned in the HPI and as above.  Objective: Vital signs in last 24 hours: Temp:  [98.4 F (36.9 C)] 98.4 F (36.9 C) (11/21 0548) Pulse Rate:  [74] 74 (11/21 0548) Resp:  [20] 20 (11/21 0548) BP: (147)/(65) 147/65 (11/21 0548) SpO2:  [98 %] 98 % (11/21 0548) Weight:  [88 kg (194 lb)] 88 kg (194 lb) (11/21 0548)  General Appearance: Alert, cooperative, no distress, appears stated age Head: Normocephalic, without obvious abnormality, atraumatic Eyes: PERRL, conjunctiva/corneas clear, EOM's intact    Neck:  Supple, symmetrical, trachea midline Back: Symmetric, no curvature, ROM normal, no CVA tenderness Lungs:  respirations unlabored Heart: Regular rate and rhythm Abdomen: Soft, non-tender Extremities: Extremities normal, atraumatic, no cyanosis or edema Pulses: 2+ and symmetric all extremities Skin: Skin color, texture, turgor normal, no rashes or lesions  NEUROLOGIC:   Mental status: Alert and oriented x4,  no aphasia, good attention span, fund of knowledge, and memory Motor Exam - grossly normal Sensory Exam - grossly normal Reflexes: 1+ Coordination - grossly normal Gait - grossly normal Balance - grossly normal Cranial Nerves: I: smell Not tested  II: visual acuity  OS:  nl    OD: nl  II: visual fields Full to confrontation  II: pupils Equal, round, reactive to light  III,VII: ptosis None  III,IV,VI: extraocular muscles  Full ROM  V: mastication Normal  V: facial light touch sensation  Normal  V,VII: corneal reflex  Present  VII: facial muscle function - upper  Normal  VII: facial muscle function - lower Normal  VIII: hearing Not tested  IX: soft palate elevation  Normal  IX,X: gag reflex Present  XI: trapezius strength  5/5  XI: sternocleidomastoid strength 5/5  XI: neck flexion strength  5/5  XII: tongue strength  Normal    Data Review Lab Results  Component Value Date   WBC 8.2 08/21/2016   HGB 11.2 (L) 08/21/2016   HCT 33.7 (L) 08/21/2016   MCV 90.6 08/21/2016   PLT 307 08/21/2016   Lab Results  Component Value Date   NA 141 08/21/2016   K 3.3 (L) 08/21/2016   CL 107 08/21/2016   CO2 26 08/21/2016   BUN 13 08/21/2016   CREATININE 1.12 (H) 08/21/2016   GLUCOSE 149 (H) 08/21/2016   Lab Results  Component Value Date   INR 0.95 08/21/2016    Assessment/Plan: Patient admitted for DLL/ fusion L2-3. Patient has failed a reasonable attempt at conservative therapy.  I explained the condition and procedure to the patient and answered any questions.   Patient wishes to proceed with procedure as planned. Understands risks/ benefits and typical outcomes of procedure.   JONES,DAVID S 08/29/2016 7:13 AM

## 2016-08-29 NOTE — Op Note (Signed)
08/29/2016  9:57 AM  PATIENT:  Katelyn [name]  60 y.o. 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 Posterior lateral fusion L2-3 utilizing morcellized allograft, 2. Posterior nonsegmental fixation L2-3 utilizing Sequoia pedicle screws, 3. Removal of posterior segmental fixation L3-L5  SURGEON:  Sherley Bounds, MD  ASSISTANTS: Dr. Cyndy Freeze  ANESTHESIA:   General  EBL: 50 ml  Total I/O In: [id number] [I.V.:1675] Out: 150 [Urine:100; Blood:50]  BLOOD ADMINISTERED:none  DRAINS: none   SPECIMEN:  No Specimen  INDICATION FOR PROCEDURE: This patient presented with severe back pain. She had no leg pain. She had severe adjacent level degenerative disc disease and spondylosis and stenosis at L2-3. She tried medical management without relief. I recommended instrumented fusion at L2-3. I originally planned on performing a decompression for spinal stenosis present she had no leg pain I felt this carried more risks and potential benefits. Patient understood the risks, benefits, and alternatives and potential outcomes and wished to proceed.  PROCEDURE DETAILS: The patient was taken to the operating room and after induction of adequate generalized endotracheal anesthesia she was rolled to the prone position on the chest rolls and all pressure points were padded. Her lumbar region was cleaned and then prepped with DuraPrep and draped in usual sterile fashion. Her old incision was opened and carried down to the fascia. The fascia was opened and the paraspinous musculature was taken down in a sub periosteal fashion to expose L2-3 L3-4 and L4-5. The previously placed hardware was exposed. The locking caps were removed and the rods were removed. I left the screws in place in case she ever needed adjacent level fusions. It will be difficult to find those pedicle screw holes once again. I then  dissected out over the transverse processes of L2 and L3. The pedicle screw entry zones of L2 were identified utilizing surface landmarks and AP and lateral fluoroscopy. I used the pedicle probed approached pedicle. The pedicle with the ball probe. I then tapped each pedicle with a 4.5 tap. I then palpated once again with the ball probe and then placed 5.5 x 40 mm pedicle screws into the L2 pedicles. We then checked with AP and lateral fluoroscopy. I decorticated the transverse processes of L2 and L3 and placed morcellized allograft over these to perform arthrodesis L2-3 bilaterally. Lordotic rods were placed into the multiaxial screw heads of the pedicle screws and locked into position with the locking caps and the anti-torque device. We checked our final construct in AP and lateral fluoroscopy. The wound had been irrigated with copious amounts of bacitracin containing saline solution. I closed the fascia with 0 Vicryl. I closed the subcutaneous tissue with 2-0 Vicryl. I closed the subcuticular tissue with 3-0 Vicryl. At the end of the procedure all sponge needle and counts were correct. A sterile dressing was applied. The patient was then awakened from general anesthesia and transported to the recovery room in stable condition.  PLAN OF CARE: Admit to inpatient   PATIENT DISPOSITION:  PACU - hemodynamically stable.   Delay start of Pharmacological VTE agent (>24hrs) due to surgical blood loss or risk of bleeding:  yes

## 2016-08-30 LAB — GLUCOSE, CAPILLARY
GLUCOSE-CAPILLARY: 315 mg/dL — AB (ref 65–99)
Glucose-Capillary: 224 mg/dL — ABNORMAL HIGH (ref 65–99)

## 2016-08-30 MED ORDER — INSULIN ASPART 100 UNIT/ML ~~LOC~~ SOLN: 0.0000 [IU] | Freq: Every day | SUBCUTANEOUS | Status: DC

## 2016-08-30 MED ORDER — INSULIN ASPART 100 UNIT/ML ~~LOC~~ SOLN
0.0000 [IU] | Freq: Three times a day (TID) | SUBCUTANEOUS | Status: DC
  Administered 2016-08-30: 11 [IU] via SUBCUTANEOUS
  Administered 2016-08-30: 5 [IU] via SUBCUTANEOUS

## 2016-08-30 MED ORDER — METHOCARBAMOL 500 MG PO TABS: 500.0000 mg | ORAL_TABLET | Freq: Three times a day (TID) | ORAL | 1 refills | Status: DC | PRN

## 2016-08-30 MED ORDER — OXYCODONE-ACETAMINOPHEN 5-325 MG PO TABS: 1.0000 | ORAL_TABLET | ORAL | 0 refills | Status: DC | PRN

## 2016-08-30 MED ORDER — INSULIN ASPART 100 UNIT/ML ~~LOC~~ SOLN
6.0000 [IU] | Freq: Three times a day (TID) | SUBCUTANEOUS | Status: DC
Start: 2016-08-30 — End: 2016-08-30
  Administered 2016-08-30: 6 [IU] via SUBCUTANEOUS

## 2016-08-30 MED ORDER — INSULIN ASPART 100 UNIT/ML ~~LOC~~ SOLN: 0.0000 [IU] | Freq: Three times a day (TID) | SUBCUTANEOUS | Status: DC

## 2016-08-30 MED ORDER — INSULIN DETEMIR 100 UNIT/ML ~~LOC~~ SOLN
60.0000 [IU] | Freq: Every day | SUBCUTANEOUS | Status: DC
  Administered 2016-08-30: 60 [IU] via SUBCUTANEOUS
  Filled 2016-08-30: qty 0.6

## 2016-08-30 NOTE — Evaluation (Signed)
Occupational Therapy Evaluation Patient Details Name: Katelyn [name] MRN: [id number] DOB: [birth year] Today's Date: 08/30/2016    History of Present Illness 60 yo female s/p PLF L2-3,Posterior nonsegmental fixation L2-3 utilizing Sequoia pedicle screws, Removal of posterior segmental fixation L3-L5   Clinical Impression   Patient is s/p PLF L2-3 surgery resulting in functional limitations due to the deficits listed below (see OT problem list). PTA was independent with adls and has all necessary DME at home.  Patient will benefit from skilled OT acutely to increase independence and safety with ADLS to allow discharge home without follow up .     Follow Up Recommendations  No OT follow up    Equipment Recommendations  None recommended by OT    Recommendations for Other Services       Precautions / Restrictions Precautions Precautions: Back Precaution Comments: handout provided and reviewed in adl Required Braces or Orthoses: Spinal Brace Spinal Brace: Lumbar corset;Applied in standing position      Mobility Bed Mobility               General bed mobility comments: sitting EOB on arrival  Transfers Overall transfer level: Modified independent                    Balance                                            ADL Overall ADL's : Modified independent                                       General ADL Comments: pt able to cross bil Le and has reacher at home. pt with previous surg and great recall of precautions. pt provided handout and reviewed lifting restrictions. pt has good support from spouse   Back handout provided and reviewed adls in detail. Pt educated on: clothing between brace, never sleep in brace, set an alarm at night for medication, avoid sitting for long periods of time, correct bed positioning for sleeping, correct sequence for bed mobility, avoiding lifting more than 5 pounds and never wash directly  over incision. All education is complete and patient indicates understanding.    Vision     Perception     Praxis      Pertinent Vitals/Pain Pain Assessment: No/denies pain     Hand Dominance Right   Extremity/Trunk Assessment Upper Extremity Assessment Upper Extremity Assessment: Overall WFL for tasks assessed   Lower Extremity Assessment Lower Extremity Assessment: Defer to PT evaluation   Cervical / Trunk Assessment Cervical / Trunk Assessment: Other exceptions Cervical / Trunk Exceptions: s/p surg    Communication Communication Communication: No difficulties   Cognition Arousal/Alertness: Awake/alert Behavior During Therapy: WFL for tasks assessed/performed Overall Cognitive Status: Within Functional Limits for tasks assessed                     General Comments       Exercises       Shoulder Instructions      Home Living Family/patient expects to be discharged to:: Private residence Living Arrangements: Spouse/significant other Available Help at Discharge: Family Type of Home: House Home Access: Ramped entrance     Home Layout: One level     Bathroom Shower/Tub:  Walk-in shower;Tub/shower unit   Bathroom Toilet: Standard     Home Equipment: Environmental consultant - 2 wheels;Walker - 4 wheels;Walker - standard;Bedside commode;Adaptive equipment Adaptive Equipment: Reacher;Sock aid        Prior Functioning/Environment Level of Independence: Independent                 OT Problem List:     OT Treatment/Interventions:      OT Goals(Current goals can be found in the care plan section)    OT Frequency:     Barriers to D/C:            Co-evaluation              End of Session Equipment Utilized During Treatment: Gait belt;Rolling walker;Back brace Nurse Communication: Mobility status;Precautions  Activity Tolerance: Patient tolerated treatment well Patient left: in chair;with call bell/phone within reach   Time: 0745-0806 OT  Time Calculation (min): 21 min Charges:  OT General Charges $OT Visit: 1 Procedure OT Evaluation $OT Eval Moderate Complexity: 1 Procedure G-Codes:    Peri Maris September 09, 2016, 8:12 AM  Jeri Modena   OTR/L Pager: [phone] Office: [phone] .

## 2016-08-30 NOTE — Discharge Summary (Signed)
Physician Discharge Summary  Patient ID: Katelyn [name] MRN: [id number] DOB/AGE: June 18, 1956 60 y.o.  Admit date: 08/29/2016 Discharge date: 08/30/2016  Admission Diagnoses: adjacent level dz L2-3    Discharge Diagnoses: same   Discharged Condition: good  Hospital Course: The patient was admitted on 08/29/2016 and taken to the operating room where the patient underwent lumbar fusion L2-3. The patient tolerated the procedure well and was taken to the recovery room and then to the floor in stable condition. The hospital course was routine. There were no complications. The wound remained clean dry and intact. Pt had appropriate back soreness. No complaints of leg pain or new N/T/W. The patient remained afebrile with stable vital signs, and tolerated a regular diet. The patient continued to increase activities, and pain was well controlled with oral pain medications.   Consults: None  Significant Diagnostic Studies:  Results for orders placed or performed during the hospital encounter of 08/29/16  Glucose, capillary  Result Value Ref Range   Glucose-Capillary 132 (H) 65 - 99 mg/dL   Comment 1 Notify RN    Comment 2 Document in Chart   Glucose, capillary  Result Value Ref Range   Glucose-Capillary 154 (H) 65 - 99 mg/dL   Comment 1 Notify RN    Comment 2 Document in Chart   Glucose, capillary  Result Value Ref Range   Glucose-Capillary 293 (H) 65 - 99 mg/dL  Glucose, capillary  Result Value Ref Range   Glucose-Capillary 271 (H) 65 - 99 mg/dL   Comment 1 Notify RN    Comment 2 Document in Chart   Glucose, capillary  Result Value Ref Range   Glucose-Capillary 315 (H) 65 - 99 mg/dL   Comment 1 Document in Chart     Dg Lumbar Spine 2-3 Views  Result Date: 08/29/2016 CLINICAL DATA:  Posterior fusion of lumbar spine. EXAM: DG C-ARM 61-120 MIN; LUMBAR SPINE - 2-3 VIEW Radiation exposure index:  40 seconds. COMPARISON:  Radiographs of July 03, 2016. FINDINGS: Two intraoperative  fluoroscopic images of the lumbar spine were obtained. These images demonstrate interval surgical posterior fusion of L2-3 with bilateral intrapedicular screw placement. Previous intrapedicular screw placement of L3, L4 and L5 is noted with interbody fusion of L3-4 and L4-5. IMPRESSION: Surgical posterior fusion as described above. Good alignment of vertebral bodies is noted. Electronically Signed   By: Marijo Conception, M.D.   On: 08/29/2016 09:57   Dg C-arm 1-60 Min  Result Date: 08/29/2016 CLINICAL DATA:  Posterior fusion of lumbar spine. EXAM: DG C-ARM 61-120 MIN; LUMBAR SPINE - 2-3 VIEW Radiation exposure index:  40 seconds. COMPARISON:  Radiographs of July 03, 2016. FINDINGS: Two intraoperative fluoroscopic images of the lumbar spine were obtained. These images demonstrate interval surgical posterior fusion of L2-3 with bilateral intrapedicular screw placement. Previous intrapedicular screw placement of L3, L4 and L5 is noted with interbody fusion of L3-4 and L4-5. IMPRESSION: Surgical posterior fusion as described above. Good alignment of vertebral bodies is noted. Electronically Signed   By: Marijo Conception, M.D.   On: 08/29/2016 09:57    Antibiotics:  Anti-infectives    Start     Dose/Rate Route Frequency Ordered Stop   08/29/16 1500  ceFAZolin (ANCEF) IVPB 1 g/50 mL premix     1 g 100 mL/hr over 30 Minutes Intravenous Every 8 hours 08/29/16 1156 08/29/16 2330   08/29/16 0915  vancomycin (VANCOCIN) powder  Status:  Discontinued       As needed 08/29/16 0916  08/29/16 0956   08/29/16 0837  50,000 units bacitracin in 0.9% normal saline 250 mL irrigation  Status:  Discontinued       As needed 08/29/16 0837 08/29/16 0956   08/29/16 0604  ceFAZolin (ANCEF) IVPB 2g/100 mL premix     2 g 200 mL/hr over 30 Minutes Intravenous On call to O.R. 08/29/16 0604 08/29/16 0732      Discharge Exam: Blood pressure (!) 118/48, pulse 62, temperature 98.6 F (37 C), resp. rate 16, weight 88 kg (194  lb), SpO2 97 %. Neurologic: Grossly normal Dressing dry  Discharge Medications:     Medication List    STOP taking these medications   naproxen sodium 220 MG tablet Commonly known as:  ANAPROX     TAKE these medications   aspirin 81 MG tablet Take 81 mg by mouth at bedtime.   aspirin-acetaminophen-caffeine 250-250-65 MG tablet Commonly known as:  EXCEDRIN MIGRAINE Take 2 tablets by mouth every 6 (six) hours as needed for headache.   cholecalciferol 1000 units tablet Commonly known as:  VITAMIN D Take 1,000 Units by mouth daily.   DULoxetine 60 MG capsule Commonly known as:  CYMBALTA Take 60 mg by mouth every evening.   esomeprazole 20 MG capsule Commonly known as:  NEXIUM Take 20 mg by mouth 3 (three) times daily before meals.   furosemide 20 MG tablet Commonly known as:  LASIX Take 20 mg by mouth daily.   gabapentin 100 MG capsule Commonly known as:  NEURONTIN Take 100 mg by mouth 2 (two) times daily.   insulin aspart 100 UNIT/ML injection Commonly known as:  novoLOG Inject 25-35 Units into the skin 3 (three) times daily before meals. If blood sugar is over 200 inject 25 units, if BS is over 300 inject 30 units, if BS is over 400 inject 35 units.   insulin detemir 100 UNIT/ML injection Commonly known as:  LEVEMIR Inject 60 Units into the skin at bedtime.   lisinopril-hydrochlorothiazide 20-12.5 MG tablet Commonly known as:  PRINZIDE,ZESTORETIC Take 1 tablet by mouth daily after breakfast.   methocarbamol 500 MG tablet Commonly known as:  ROBAXIN Take 1 tablet (500 mg total) by mouth every 8 (eight) hours as needed for muscle spasms.   mirtazapine 15 MG tablet Commonly known as:  REMERON Take 15 mg by mouth at bedtime.   nitroGLYCERIN 0.4 MG SL tablet Commonly known as:  NITROSTAT Place 0.4 mg under the tongue every 5 (five) minutes as needed for chest pain.   oxyCODONE 15 MG immediate release tablet Commonly known as:  ROXICODONE Take 15 mg by mouth  at bedtime.   oxyCODONE-acetaminophen 5-325 MG tablet Commonly known as:  PERCOCET/ROXICET Take 1-2 tablets by mouth every 4 (four) hours as needed for moderate pain.   pramipexole 0.5 MG tablet Commonly known as:  MIRAPEX Take 0.5 mg by mouth at bedtime.   PROAIR RESPICLICK 123XX123 (90 Base) MCG/ACT Aepb Generic drug:  Albuterol Sulfate Inhale 1 puff into the lungs every 6 (six) hours as needed (shortness of breath).   ranitidine 150 MG tablet Commonly known as:  ZANTAC Take 150 mg by mouth at bedtime.   TOUJEO SOLOSTAR 300 UNIT/ML Sopn Generic drug:  Insulin Glargine Inject 60 Units into the skin at bedtime.   trolamine salicylate 10 % cream Commonly known as:  ASPERCREME Apply 1 application topically as needed for muscle pain.            Durable Medical Equipment  Start     Ordered   08/29/16 1157  DME Walker rolling  Once    Question:  Patient needs a walker to treat with the following condition  Answer:  S/P lumbar spinal fusion   08/29/16 1156      Disposition: home   Final Dx: lumbar fusion L2-3  Discharge Instructions     Remove dressing in 72 hours    Complete by:  As directed    Call MD for:  difficulty breathing, headache or visual disturbances    Complete by:  As directed    Call MD for:  persistant nausea and vomiting    Complete by:  As directed    Call MD for:  redness, tenderness, or signs of infection (pain, swelling, redness, odor or green/yellow discharge around incision site)    Complete by:  As directed    Call MD for:  severe uncontrolled pain    Complete by:  As directed    Call MD for:  temperature >100.4    Complete by:  As directed    Diet - low sodium heart healthy    Complete by:  As directed    Increase activity slowly    Complete by:  As directed          Signed: Anaston Koehn S 08/30/2016, 8:43 AM

## 2016-08-30 NOTE — Progress Notes (Signed)
Patient alert and oriented, mae's well, voiding adequate amount of urine, swallowing without difficulty, no c/o pain at time of discharge. Patient discharged home with family. Script and discharged instructions given to patient. Patient and family stated understanding of instructions given. Patient has an appointment with Dr. Jones °

## 2016-08-30 NOTE — Progress Notes (Signed)
Inpatient Diabetes Program Recommendations  AACE/ADA: New Consensus Statement on Inpatient Glycemic Control (2015)  Target Ranges:  Prepandial:   less than 140 mg/dL      Peak postprandial:   less than 180 mg/dL (1-2 hours)      Critically ill patients:  140 - 180 mg/dL   Lab Results  Component Value Date   GLUCAP 315 (H) 08/30/2016   HGBA1C 8.7 (H) 08/21/2016   Home DM Meds: Levemir 60 units qd+Novolog 25-35 ac meals tid  Inpatient Diabetes Program Recommendations:  Spoke with patient @ bedside and Dr. Ronnald Ramp made rounds. Patient states she has been taking Levemir instead of Toujeo due to cost and current Levemir costs $45/month. Gave patient copay card for Toujeo $10/month or Lantus $0/month to discuss with Dr. Forde Dandy to determine best plan for patient. Patient did not receive any  insulin yesterday so CBG this am 315. Patient to be discharged this am.  Thank you, Nani Gasser. Camp Gopal, RN, MSN, CDE Inpatient Glycemic Control Team Team Pager [phone] (8am-5pm) 08/30/2016 9:12 AM

## 2016-08-30 NOTE — Evaluation (Signed)
Physical Therapy Evaluation Patient Details Name: Katelyn [name] MRN: [id number] DOB: 1956-08-21 Today's Date: 08/30/2016   History of Present Illness  60 yo female s/p PLF L2-3,Posterior nonsegmental fixation L2-3 utilizing Sequoia pedicle screws, Removal of posterior segmental fixation L3-L5  Clinical Impression  Patient evaluated by Physical Therapy with no further acute PT needs identified. All education has been completed and the patient has no further questions. At the time of PT eval pt was able to perform transfers and ambulation with gross modified independence. Pt states she feels comfortable returning home today and has ample family support. See below for any follow-up Physical Therapy or equipment needs. PT is signing off. Thank you for this referral.    Follow Up Recommendations Outpatient PT;Supervision - Intermittent    Equipment Recommendations  None recommended by PT    Recommendations for Other Services       Precautions / Restrictions Precautions Precautions: Back Precaution Comments: handout provided and reviewed in adl Required Braces or Orthoses: Spinal Brace (From previous surgery) Spinal Brace: Lumbar corset;Applied in standing position Restrictions Weight Bearing Restrictions: No      Mobility  Bed Mobility               General bed mobility comments: Pt up in chair upon PT arrival   Transfers Overall transfer level: Modified independent Equipment used: Rolling walker (2 wheeled)                Ambulation/Gait Ambulation/Gait assistance: Modified independent (Device/Increase time) Ambulation Distance (Feet): 400 Feet Assistive device: Rolling walker (2 wheeled) Gait Pattern/deviations: Step-through pattern;Decreased stride length Gait velocity: Decreased Gait velocity interpretation: Below normal speed for age/gender General Gait Details: Slow but generally steady gait pattern. Pt using RW for light support/comfort.   Stairs Stairs:   (Deferred as pt has a ramp to enter)          Wheelchair Mobility    Modified Rankin (Stroke Patients Only)       Balance Overall balance assessment: No apparent balance deficits (not formally assessed)                                           Pertinent Vitals/Pain Pain Assessment: No/denies pain    Home Living Family/patient expects to be discharged to:: Private residence Living Arrangements: Spouse/significant other Available Help at Discharge: Family Type of Home: House Home Access: Ramped entrance     Home Layout: One level Home Equipment: Environmental consultant - 2 wheels;Walker - 4 wheels;Walker - standard;Bedside commode;Adaptive equipment      Prior Function Level of Independence: Independent               Hand Dominance   Dominant Hand: Right    Extremity/Trunk Assessment   Upper Extremity Assessment: Defer to OT evaluation           Lower Extremity Assessment: Overall WFL for tasks assessed      Cervical / Trunk Assessment: Other exceptions  Communication   Communication: No difficulties  Cognition Arousal/Alertness: Awake/alert Behavior During Therapy: WFL for tasks assessed/performed Overall Cognitive Status: Within Functional Limits for tasks assessed                      General Comments General comments (skin integrity, edema, etc.): dressing changed by RN on arrival    Exercises     Assessment/Plan    PT  Assessment Patent does not need any further PT services  PT Problem List            PT Treatment Interventions      PT Goals (Current goals can be found in the Care Plan section)  Acute Rehab PT Goals PT Goal Formulation: All assessment and education complete, DC therapy    Frequency     Barriers to discharge        Co-evaluation               End of Session   Activity Tolerance: Patient tolerated treatment well Patient left: in chair;with call bell/phone within reach Nurse  Communication: Mobility status         Time: [id number] PT Time Calculation (min) (ACUTE ONLY): 25 min   Charges:   PT Evaluation $PT Eval Moderate Complexity: 1 Procedure PT Treatments $Gait Training: 8-22 mins   PT G Codes:        Thelma Comp September 17, 2016, 9:00 AM  Rolinda Roan, PT, DPT Acute Rehabilitation Services Pager: [phone]

## 2016-09-12 DIAGNOSIS — R0609 Other forms of dyspnea: Secondary | ICD-10-CM | POA: Diagnosis not present

## 2016-09-12 DIAGNOSIS — Z6834 Body mass index (BMI) 34.0-34.9, adult: Secondary | ICD-10-CM | POA: Diagnosis not present

## 2016-09-12 DIAGNOSIS — R740 Nonspecific elevation of levels of transaminase and lactic acid dehydrogenase [LDH]: Secondary | ICD-10-CM | POA: Diagnosis not present

## 2016-09-12 DIAGNOSIS — E1142 Type 2 diabetes mellitus with diabetic polyneuropathy: Secondary | ICD-10-CM | POA: Diagnosis not present

## 2016-09-12 DIAGNOSIS — E1149 Type 2 diabetes mellitus with other diabetic neurological complication: Secondary | ICD-10-CM | POA: Diagnosis not present

## 2016-09-12 DIAGNOSIS — F329 Major depressive disorder, single episode, unspecified: Secondary | ICD-10-CM | POA: Diagnosis not present

## 2016-09-12 DIAGNOSIS — M5416 Radiculopathy, lumbar region: Secondary | ICD-10-CM | POA: Diagnosis not present

## 2016-09-12 DIAGNOSIS — I10 Essential (primary) hypertension: Secondary | ICD-10-CM | POA: Diagnosis not present

## 2016-09-12 DIAGNOSIS — M109 Gout, unspecified: Secondary | ICD-10-CM | POA: Diagnosis not present

## 2016-09-12 DIAGNOSIS — D508 Other iron deficiency anemias: Secondary | ICD-10-CM | POA: Diagnosis not present

## 2016-10-16 DIAGNOSIS — M48061 Spinal stenosis, lumbar region without neurogenic claudication: Secondary | ICD-10-CM | POA: Diagnosis not present

## 2016-10-23 ENCOUNTER — Other Ambulatory Visit: Payer: Self-pay | Admitting: Neurological Surgery

## 2016-10-23 DIAGNOSIS — T8130XA Disruption of wound, unspecified, initial encounter: Secondary | ICD-10-CM | POA: Insufficient documentation

## 2016-10-23 DIAGNOSIS — M48061 Spinal stenosis, lumbar region without neurogenic claudication: Secondary | ICD-10-CM | POA: Diagnosis not present

## 2016-10-23 DIAGNOSIS — M545 Low back pain: Secondary | ICD-10-CM | POA: Diagnosis not present

## 2016-10-24 ENCOUNTER — Encounter (HOSPITAL_COMMUNITY): Payer: Self-pay | Admitting: *Deleted

## 2016-10-25 ENCOUNTER — Encounter (HOSPITAL_COMMUNITY): Payer: Self-pay | Admitting: *Deleted

## 2016-10-25 ENCOUNTER — Ambulatory Visit (HOSPITAL_COMMUNITY)
Admission: RE | Admit: 2016-10-25 | Discharge: 2016-10-26 | Disposition: A | Discharge status: Home / Self Care | Payer: PPO | Source: Ambulatory Visit | Attending: Neurological Surgery | Admitting: Neurological Surgery

## 2016-10-25 ENCOUNTER — Encounter (HOSPITAL_COMMUNITY)
Admission: RE | Disposition: A | Discharge status: Home / Self Care | Payer: Self-pay | Source: Ambulatory Visit | Attending: Neurological Surgery

## 2016-10-25 ENCOUNTER — Ambulatory Visit (HOSPITAL_COMMUNITY): Payer: PPO | Admitting: Anesthesiology

## 2016-10-25 DIAGNOSIS — F329 Major depressive disorder, single episode, unspecified: Secondary | ICD-10-CM | POA: Insufficient documentation

## 2016-10-25 DIAGNOSIS — E119 Type 2 diabetes mellitus without complications: Secondary | ICD-10-CM | POA: Diagnosis not present

## 2016-10-25 DIAGNOSIS — Y838 Other surgical procedures as the cause of abnormal reaction of the patient, or of later complication, without mention of misadventure at the time of the procedure: Secondary | ICD-10-CM | POA: Diagnosis not present

## 2016-10-25 DIAGNOSIS — N189 Chronic kidney disease, unspecified: Secondary | ICD-10-CM | POA: Diagnosis not present

## 2016-10-25 DIAGNOSIS — Z7982 Long term (current) use of aspirin: Secondary | ICD-10-CM | POA: Insufficient documentation

## 2016-10-25 DIAGNOSIS — I2510 Atherosclerotic heart disease of native coronary artery without angina pectoris: Secondary | ICD-10-CM | POA: Insufficient documentation

## 2016-10-25 DIAGNOSIS — Z79899 Other long term (current) drug therapy: Secondary | ICD-10-CM | POA: Insufficient documentation

## 2016-10-25 DIAGNOSIS — S31000A Unspecified open wound of lower back and pelvis without penetration into retroperitoneum, initial encounter: Secondary | ICD-10-CM | POA: Diagnosis not present

## 2016-10-25 DIAGNOSIS — T8130XA Disruption of wound, unspecified, initial encounter: Secondary | ICD-10-CM | POA: Insufficient documentation

## 2016-10-25 DIAGNOSIS — F419 Anxiety disorder, unspecified: Secondary | ICD-10-CM | POA: Insufficient documentation

## 2016-10-25 DIAGNOSIS — E1122 Type 2 diabetes mellitus with diabetic chronic kidney disease: Secondary | ICD-10-CM | POA: Insufficient documentation

## 2016-10-25 DIAGNOSIS — G4730 Sleep apnea, unspecified: Secondary | ICD-10-CM | POA: Insufficient documentation

## 2016-10-25 DIAGNOSIS — K219 Gastro-esophageal reflux disease without esophagitis: Secondary | ICD-10-CM | POA: Insufficient documentation

## 2016-10-25 DIAGNOSIS — I129 Hypertensive chronic kidney disease with stage 1 through stage 4 chronic kidney disease, or unspecified chronic kidney disease: Secondary | ICD-10-CM | POA: Diagnosis not present

## 2016-10-25 DIAGNOSIS — Z794 Long term (current) use of insulin: Secondary | ICD-10-CM | POA: Diagnosis not present

## 2016-10-25 DIAGNOSIS — I447 Left bundle-branch block, unspecified: Secondary | ICD-10-CM | POA: Insufficient documentation

## 2016-10-25 DIAGNOSIS — Z9889 Other specified postprocedural states: Secondary | ICD-10-CM

## 2016-10-25 DIAGNOSIS — I10 Essential (primary) hypertension: Secondary | ICD-10-CM | POA: Diagnosis not present

## 2016-10-25 HISTORY — DX: Dyspnea, unspecified: R06.00

## 2016-10-25 HISTORY — DX: Personal history of other medical treatment: Z92.89

## 2016-10-25 HISTORY — DX: Chronic kidney disease, unspecified: N18.9

## 2016-10-25 HISTORY — DX: Polyneuropathy, unspecified: G62.9

## 2016-10-25 HISTORY — PX: LUMBAR WOUND DEBRIDEMENT: SHX1988

## 2016-10-25 LAB — CBC
HEMATOCRIT: 32.8 % — AB (ref 36.0–46.0)
HEMOGLOBIN: 10.6 g/dL — AB (ref 12.0–15.0)
MCH: 29.5 pg (ref 26.0–34.0)
MCHC: 32.3 g/dL (ref 30.0–36.0)
MCV: 91.4 fL (ref 78.0–100.0)
Platelets: 323 10*3/uL (ref 150–400)
RBC: 3.59 MIL/uL — ABNORMAL LOW (ref 3.87–5.11)
RDW: 13.9 % (ref 11.5–15.5)
WBC: 5.7 10*3/uL (ref 4.0–10.5)

## 2016-10-25 LAB — BASIC METABOLIC PANEL
ANION GAP: 10 (ref 5–15)
BUN: 30 mg/dL — ABNORMAL HIGH (ref 6–20)
CALCIUM: 9.5 mg/dL (ref 8.9–10.3)
CHLORIDE: 105 mmol/L (ref 101–111)
CO2: 22 mmol/L (ref 22–32)
Creatinine, Ser: 1.07 mg/dL — ABNORMAL HIGH (ref 0.44–1.00)
GFR calc Af Amer: >60 mL/min (ref >60)
GFR calc non Af Amer: 56 mL/min — ABNORMAL LOW (ref >60)
GLUCOSE: 140 mg/dL — AB (ref 65–99)
Potassium: 4.6 mmol/L (ref 3.5–5.1)
Sodium: 137 mmol/L (ref 135–145)

## 2016-10-25 LAB — GLUCOSE, CAPILLARY
GLUCOSE-CAPILLARY: 362 mg/dL — AB (ref 65–99)
Glucose-Capillary: 148 mg/dL — ABNORMAL HIGH (ref 65–99)
Glucose-Capillary: 119 mg/dL — ABNORMAL HIGH (ref 65–99)
Glucose-Capillary: 340 mg/dL — ABNORMAL HIGH (ref 65–99)

## 2016-10-25 SURGERY — LUMBAR WOUND DEBRIDEMENT
Anesthesia: General | Site: Back

## 2016-10-25 MED ORDER — ALBUTEROL SULFATE (2.5 MG/3ML) 0.083% IN NEBU: 2.5000 mg | INHALATION_SOLUTION | Freq: Four times a day (QID) | RESPIRATORY_TRACT | Status: DC | PRN

## 2016-10-25 MED ORDER — INSULIN ASPART 100 UNIT/ML ~~LOC~~ SOLN
0.0000 [IU] | Freq: Three times a day (TID) | SUBCUTANEOUS | Status: DC
Start: 2016-10-26 — End: 2016-10-26
  Administered 2016-10-26: 5 [IU] via SUBCUTANEOUS

## 2016-10-25 MED ORDER — MEPERIDINE HCL 25 MG/ML IJ SOLN: 6.2500 mg | INTRAMUSCULAR | Status: DC | PRN

## 2016-10-25 MED ORDER — LIDOCAINE 2% (20 MG/ML) 5 ML SYRINGE
INTRAMUSCULAR | Status: AC
  Filled 2016-10-25: qty 5

## 2016-10-25 MED ORDER — SENNA 8.6 MG PO TABS
1.0000 | ORAL_TABLET | Freq: Two times a day (BID) | ORAL | Status: DC
  Administered 2016-10-25 – 2016-10-26 (×2): 8.6 mg via ORAL
  Filled 2016-10-25 (×2): qty 1

## 2016-10-25 MED ORDER — VANCOMYCIN HCL 500 MG IV SOLR
INTRAVENOUS | Status: DC | PRN
  Administered 2016-10-25: 1000 mg via TOPICAL

## 2016-10-25 MED ORDER — LIDOCAINE HCL (CARDIAC) 20 MG/ML IV SOLN
INTRAVENOUS | Status: DC | PRN
  Administered 2016-10-25: 100 mg via INTRAVENOUS

## 2016-10-25 MED ORDER — OXYCODONE HCL 5 MG PO TABS
10.0000 mg | ORAL_TABLET | ORAL | Status: DC | PRN
  Administered 2016-10-26 (×2): 10 mg via ORAL
  Filled 2016-10-25 (×2): qty 2

## 2016-10-25 MED ORDER — MIDAZOLAM HCL 5 MG/5ML IJ SOLN
INTRAMUSCULAR | Status: DC | PRN
  Administered 2016-10-25: 2 mg via INTRAVENOUS

## 2016-10-25 MED ORDER — PHENOL 1.4 % MT LIQD: 1.0000 | OROMUCOSAL | Status: DC | PRN

## 2016-10-25 MED ORDER — ACETAMINOPHEN 650 MG RE SUPP: 650.0000 mg | RECTAL | Status: DC | PRN

## 2016-10-25 MED ORDER — SUGAMMADEX SODIUM 200 MG/2ML IV SOLN
INTRAVENOUS | Status: DC | PRN
  Administered 2016-10-25: 175 mg via INTRAVENOUS

## 2016-10-25 MED ORDER — SODIUM CHLORIDE 0.9 % IV SOLN: 250.0000 mL | INTRAVENOUS | Status: DC

## 2016-10-25 MED ORDER — GABAPENTIN 100 MG PO CAPS
100.0000 mg | ORAL_CAPSULE | Freq: Two times a day (BID) | ORAL | Status: DC
  Administered 2016-10-25 – 2016-10-26 (×2): 100 mg via ORAL
  Filled 2016-10-25 (×2): qty 1

## 2016-10-25 MED ORDER — ONDANSETRON HCL 4 MG/2ML IJ SOLN
INTRAMUSCULAR | Status: DC | PRN
  Administered 2016-10-25: 4 mg via INTRAVENOUS

## 2016-10-25 MED ORDER — MIDAZOLAM HCL 2 MG/2ML IJ SOLN
INTRAMUSCULAR | Status: AC
  Filled 2016-10-25: qty 2

## 2016-10-25 MED ORDER — PROPOFOL 10 MG/ML IV BOLUS
INTRAVENOUS | Status: AC
  Filled 2016-10-25: qty 20

## 2016-10-25 MED ORDER — SCOPOLAMINE 1 MG/3DAYS TD PT72
MEDICATED_PATCH | TRANSDERMAL | Status: AC
  Filled 2016-10-25: qty 1

## 2016-10-25 MED ORDER — DEXAMETHASONE SODIUM PHOSPHATE 10 MG/ML IJ SOLN
INTRAMUSCULAR | Status: AC
  Filled 2016-10-25: qty 1

## 2016-10-25 MED ORDER — THROMBIN 5000 UNITS EX SOLR
CUTANEOUS | Status: AC
  Filled 2016-10-25: qty 10000

## 2016-10-25 MED ORDER — SCOPOLAMINE 1 MG/3DAYS TD PT72
MEDICATED_PATCH | TRANSDERMAL | Status: DC | PRN
  Administered 2016-10-25: 1 via TRANSDERMAL

## 2016-10-25 MED ORDER — POTASSIUM CHLORIDE IN NACL 20-0.9 MEQ/L-% IV SOLN: INTRAVENOUS | Status: DC

## 2016-10-25 MED ORDER — SODIUM CHLORIDE 0.9 % IR SOLN
Status: DC | PRN
  Administered 2016-10-25: 12:00:00

## 2016-10-25 MED ORDER — 0.9 % SODIUM CHLORIDE (POUR BTL) OPTIME
TOPICAL | Status: DC | PRN
  Administered 2016-10-25: 1000 mL

## 2016-10-25 MED ORDER — BUPIVACAINE HCL (PF) 0.25 % IJ SOLN
INTRAMUSCULAR | Status: DC | PRN
  Administered 2016-10-25: .001 mL

## 2016-10-25 MED ORDER — HEMOSTATIC AGENTS (NO CHARGE) OPTIME
TOPICAL | Status: DC | PRN
  Administered 2016-10-25: 1 via TOPICAL

## 2016-10-25 MED ORDER — LISINOPRIL-HYDROCHLOROTHIAZIDE 20-12.5 MG PO TABS: 1.0000 | ORAL_TABLET | Freq: Every day | ORAL | Status: DC

## 2016-10-25 MED ORDER — ASPIRIN EC 81 MG PO TBEC
81.0000 mg | DELAYED_RELEASE_TABLET | Freq: Every day | ORAL | Status: DC
  Administered 2016-10-25: 81 mg via ORAL
  Filled 2016-10-25: qty 1

## 2016-10-25 MED ORDER — METHOCARBAMOL 500 MG PO TABS
500.0000 mg | ORAL_TABLET | Freq: Three times a day (TID) | ORAL | Status: DC | PRN
  Administered 2016-10-25 – 2016-10-26 (×2): 500 mg via ORAL
  Filled 2016-10-25 (×2): qty 1

## 2016-10-25 MED ORDER — CEFAZOLIN IN D5W 1 GM/50ML IV SOLN
1.0000 g | Freq: Three times a day (TID) | INTRAVENOUS | Status: AC
  Administered 2016-10-25 – 2016-10-26 (×2): 1 g via INTRAVENOUS
  Filled 2016-10-25 (×2): qty 50

## 2016-10-25 MED ORDER — PROPOFOL 10 MG/ML IV BOLUS
INTRAVENOUS | Status: DC | PRN
  Administered 2016-10-25: 130 mg via INTRAVENOUS
  Administered 2016-10-25: 30 mg via INTRAVENOUS

## 2016-10-25 MED ORDER — CEFAZOLIN SODIUM-DEXTROSE 2-4 GM/100ML-% IV SOLN
2.0000 g | INTRAVENOUS | Status: AC
  Administered 2016-10-25: 2 g via INTRAVENOUS

## 2016-10-25 MED ORDER — HYDROMORPHONE HCL 1 MG/ML IJ SOLN
INTRAMUSCULAR | Status: AC
  Filled 2016-10-25: qty 1

## 2016-10-25 MED ORDER — FENTANYL CITRATE (PF) 100 MCG/2ML IJ SOLN
INTRAMUSCULAR | Status: DC | PRN
  Administered 2016-10-25 (×2): 50 ug via INTRAVENOUS
  Administered 2016-10-25: 100 ug via INTRAVENOUS

## 2016-10-25 MED ORDER — ROCURONIUM BROMIDE 50 MG/5ML IV SOSY
PREFILLED_SYRINGE | INTRAVENOUS | Status: AC
  Filled 2016-10-25: qty 5

## 2016-10-25 MED ORDER — INSULIN ASPART 100 UNIT/ML ~~LOC~~ SOLN
0.0000 [IU] | Freq: Every day | SUBCUTANEOUS | Status: DC
  Administered 2016-10-25: 4 [IU] via SUBCUTANEOUS

## 2016-10-25 MED ORDER — HYDROCHLOROTHIAZIDE 12.5 MG PO CAPS
12.5000 mg | ORAL_CAPSULE | Freq: Every day | ORAL | Status: DC
  Administered 2016-10-26: 12.5 mg via ORAL
  Filled 2016-10-25: qty 1

## 2016-10-25 MED ORDER — SODIUM CHLORIDE 0.9% FLUSH
3.0000 mL | Freq: Two times a day (BID) | INTRAVENOUS | Status: DC
  Administered 2016-10-25: 3 mL via INTRAVENOUS

## 2016-10-25 MED ORDER — LACTATED RINGERS IV SOLN
INTRAVENOUS | Status: DC
  Administered 2016-10-25 (×2): via INTRAVENOUS

## 2016-10-25 MED ORDER — ACETAMINOPHEN 325 MG PO TABS: 650.0000 mg | ORAL_TABLET | ORAL | Status: DC | PRN

## 2016-10-25 MED ORDER — INSULIN ASPART 100 UNIT/ML ~~LOC~~ SOLN
0.0000 [IU] | Freq: Three times a day (TID) | SUBCUTANEOUS | Status: DC
  Administered 2016-10-25: 15 [IU] via SUBCUTANEOUS

## 2016-10-25 MED ORDER — PRAMIPEXOLE DIHYDROCHLORIDE 1 MG PO TABS
1.0000 mg | ORAL_TABLET | Freq: Every day | ORAL | Status: DC
  Administered 2016-10-25: 1 mg via ORAL
  Filled 2016-10-25: qty 1

## 2016-10-25 MED ORDER — MENTHOL 3 MG MT LOZG: 1.0000 | LOZENGE | OROMUCOSAL | Status: DC | PRN

## 2016-10-25 MED ORDER — SUGAMMADEX SODIUM 200 MG/2ML IV SOLN
INTRAVENOUS | Status: AC
  Filled 2016-10-25: qty 2

## 2016-10-25 MED ORDER — DEXAMETHASONE SODIUM PHOSPHATE 4 MG/ML IJ SOLN
INTRAMUSCULAR | Status: DC | PRN
  Administered 2016-10-25: 4 mg via INTRAVENOUS

## 2016-10-25 MED ORDER — CHLORHEXIDINE GLUCONATE CLOTH 2 % EX PADS: 6.0000 | MEDICATED_PAD | Freq: Once | CUTANEOUS | Status: DC

## 2016-10-25 MED ORDER — ONDANSETRON HCL 4 MG/2ML IJ SOLN
INTRAMUSCULAR | Status: AC
  Filled 2016-10-25: qty 2

## 2016-10-25 MED ORDER — DULOXETINE HCL 30 MG PO CPEP
60.0000 mg | ORAL_CAPSULE | Freq: Every day | ORAL | Status: DC
  Administered 2016-10-25: 60 mg via ORAL
  Filled 2016-10-25: qty 2

## 2016-10-25 MED ORDER — BUPIVACAINE HCL (PF) 0.25 % IJ SOLN
INTRAMUSCULAR | Status: AC
  Filled 2016-10-25: qty 30

## 2016-10-25 MED ORDER — FENTANYL CITRATE (PF) 100 MCG/2ML IJ SOLN
INTRAMUSCULAR | Status: AC
  Filled 2016-10-25: qty 4

## 2016-10-25 MED ORDER — INSULIN DETEMIR 100 UNIT/ML ~~LOC~~ SOLN
65.0000 [IU] | Freq: Every day | SUBCUTANEOUS | Status: DC
Start: 2016-10-25 — End: 2016-10-26
  Administered 2016-10-25: 65 [IU] via SUBCUTANEOUS
  Filled 2016-10-25: qty 0.65

## 2016-10-25 MED ORDER — MORPHINE SULFATE (PF) 4 MG/ML IV SOLN
1.0000 mg | INTRAVENOUS | Status: DC | PRN
  Administered 2016-10-25: 4 mg via INTRAVENOUS
  Administered 2016-10-25: 2 mg via INTRAVENOUS
  Filled 2016-10-25 (×2): qty 1

## 2016-10-25 MED ORDER — MIRTAZAPINE 15 MG PO TABS
15.0000 mg | ORAL_TABLET | Freq: Every day | ORAL | Status: DC
  Administered 2016-10-25: 15 mg via ORAL
  Filled 2016-10-25: qty 1

## 2016-10-25 MED ORDER — VANCOMYCIN HCL 1000 MG IV SOLR
INTRAVENOUS | Status: AC
  Filled 2016-10-25: qty 1000

## 2016-10-25 MED ORDER — CEFAZOLIN SODIUM-DEXTROSE 2-4 GM/100ML-% IV SOLN
INTRAVENOUS | Status: AC
  Filled 2016-10-25: qty 100

## 2016-10-25 MED ORDER — ROCURONIUM BROMIDE 100 MG/10ML IV SOLN
INTRAVENOUS | Status: DC | PRN
  Administered 2016-10-25: 40 mg via INTRAVENOUS

## 2016-10-25 MED ORDER — OXYCODONE HCL 5 MG PO TABS
10.0000 mg | ORAL_TABLET | Freq: Four times a day (QID) | ORAL | Status: DC | PRN
  Administered 2016-10-25: 10 mg via ORAL
  Filled 2016-10-25: qty 2

## 2016-10-25 MED ORDER — ONDANSETRON HCL 4 MG/2ML IJ SOLN: 4.0000 mg | Freq: Once | INTRAMUSCULAR | Status: DC | PRN

## 2016-10-25 MED ORDER — FUROSEMIDE 20 MG PO TABS
20.0000 mg | ORAL_TABLET | Freq: Every day | ORAL | Status: DC
  Administered 2016-10-26: 20 mg via ORAL
  Filled 2016-10-25: qty 1

## 2016-10-25 MED ORDER — SODIUM CHLORIDE 0.9% FLUSH: 3.0000 mL | INTRAVENOUS | Status: DC | PRN

## 2016-10-25 MED ORDER — HYDROMORPHONE HCL 1 MG/ML IJ SOLN
0.2500 mg | INTRAMUSCULAR | Status: DC | PRN
  Administered 2016-10-25 (×3): 0.5 mg via INTRAVENOUS

## 2016-10-25 MED ORDER — THROMBIN 5000 UNITS EX SOLR
CUTANEOUS | Status: DC | PRN
  Administered 2016-10-25: 10000 [IU] via TOPICAL

## 2016-10-25 MED ORDER — LISINOPRIL 20 MG PO TABS
20.0000 mg | ORAL_TABLET | Freq: Every day | ORAL | Status: DC
  Administered 2016-10-26: 20 mg via ORAL
  Filled 2016-10-25: qty 1

## 2016-10-25 MED ORDER — HYDROMORPHONE HCL 1 MG/ML IJ SOLN
INTRAMUSCULAR | Status: AC
  Filled 2016-10-25: qty 0.5

## 2016-10-25 MED ORDER — ONDANSETRON HCL 4 MG/2ML IJ SOLN: 4.0000 mg | INTRAMUSCULAR | Status: DC | PRN

## 2016-10-25 SURGICAL SUPPLY — 40 items
BAG DECANTER FOR FLEXI CONT (MISCELLANEOUS) ×6 IMPLANT
BENZOIN TINCTURE PRP APPL 2/3 (GAUZE/BANDAGES/DRESSINGS) ×3 IMPLANT
CANISTER SUCT 3000ML PPV (MISCELLANEOUS) ×3 IMPLANT
CARTRIDGE OIL MAESTRO DRILL (MISCELLANEOUS) IMPLANT
CLOSURE WOUND 1/2 X4 (GAUZE/BANDAGES/DRESSINGS) ×1
DIFFUSER DRILL AIR PNEUMATIC (MISCELLANEOUS) IMPLANT
DRAPE LAPAROTOMY 100X72X124 (DRAPES) ×3 IMPLANT
DRAPE POUCH INSTRU U-SHP 10X18 (DRAPES) ×3 IMPLANT
DRSG OPSITE POSTOP 4X6 (GAUZE/BANDAGES/DRESSINGS) ×3 IMPLANT
DURAPREP 26ML APPLICATOR (WOUND CARE) ×3 IMPLANT
DURAPREP 6ML APPLICATOR 50/CS (WOUND CARE) IMPLANT
ELECT REM PT RETURN 9FT ADLT (ELECTROSURGICAL) ×3
ELECTRODE REM PT RTRN 9FT ADLT (ELECTROSURGICAL) ×1 IMPLANT
GAUZE SPONGE 4X4 16PLY XRAY LF (GAUZE/BANDAGES/DRESSINGS) IMPLANT
GLOVE BIO SURGEON STRL SZ8 (GLOVE) ×3 IMPLANT
GOWN STRL REUS W/ TWL LRG LVL3 (GOWN DISPOSABLE) ×1 IMPLANT
GOWN STRL REUS W/ TWL XL LVL3 (GOWN DISPOSABLE) ×1 IMPLANT
GOWN STRL REUS W/TWL 2XL LVL3 (GOWN DISPOSABLE) ×3 IMPLANT
GOWN STRL REUS W/TWL LRG LVL3 (GOWN DISPOSABLE) ×2
GOWN STRL REUS W/TWL XL LVL3 (GOWN DISPOSABLE) ×2
KIT BASIN OR (CUSTOM PROCEDURE TRAY) ×3 IMPLANT
KIT ROOM TURNOVER OR (KITS) ×3 IMPLANT
NEEDLE HYPO 18GX1.5 BLUNT FILL (NEEDLE) IMPLANT
NEEDLE HYPO 25X1 1.5 SAFETY (NEEDLE) ×3 IMPLANT
NEEDLE SPNL 20GX3.5 QUINCKE YW (NEEDLE) IMPLANT
NS IRRIG 1000ML POUR BTL (IV SOLUTION) ×3 IMPLANT
OIL CARTRIDGE MAESTRO DRILL (MISCELLANEOUS)
PACK LAMINECTOMY NEURO (CUSTOM PROCEDURE TRAY) ×3 IMPLANT
PAD ARMBOARD 7.5X6 YLW CONV (MISCELLANEOUS) ×9 IMPLANT
STRIP CLOSURE SKIN 1/2X4 (GAUZE/BANDAGES/DRESSINGS) ×2 IMPLANT
SUT VIC AB 0 CT1 18XCR BRD8 (SUTURE) ×1 IMPLANT
SUT VIC AB 0 CT1 8-18 (SUTURE) ×2
SUT VIC AB 2-0 CP2 18 (SUTURE) ×6 IMPLANT
SUT VIC AB 3-0 SH 8-18 (SUTURE) ×6 IMPLANT
SWAB COLLECTION DEVICE MRSA (MISCELLANEOUS) ×3 IMPLANT
SWAB CULTURE ESWAB REG 1ML (MISCELLANEOUS) ×3 IMPLANT
SYR 3ML LL SCALE MARK (SYRINGE) IMPLANT
TOWEL OR 17X24 6PK STRL BLUE (TOWEL DISPOSABLE) ×3 IMPLANT
TOWEL OR 17X26 10 PK STRL BLUE (TOWEL DISPOSABLE) ×3 IMPLANT
WATER STERILE IRR 1000ML POUR (IV SOLUTION) ×3 IMPLANT

## 2016-10-25 NOTE — Anesthesia Preprocedure Evaluation (Addendum)
Anesthesia Evaluation  Patient identified by MRN, date of birth, ID band Patient awake    Reviewed: Allergy & Precautions, NPO status , Patient's Chart, lab work & pertinent test results  History of Anesthesia Complications (+) PONV and history of anesthetic complications (Patient states she does ok with patch)  Airway Mallampati: II  TM Distance: >3 FB Neck ROM: Full    Dental  (+) Teeth Intact, Dental Advisory Given   Pulmonary shortness of breath, sleep apnea ,    Pulmonary exam normal breath sounds clear to auscultation       Cardiovascular hypertension, Pt. on medications + CAD  Normal cardiovascular exam Rhythm:Regular Rate:Normal  Cath 2014. Medically managed.  ECG BBB.   Neuro/Psych PSYCHIATRIC DISORDERS Anxiety Depression    GI/Hepatic GERD  ,  Endo/Other  diabetes, Type 2, Insulin Dependent  Renal/GU Renal disease     Musculoskeletal   Abdominal   Peds  Hematology   Anesthesia Other Findings   Reproductive/Obstetrics                            Anesthesia Physical Anesthesia Plan  ASA: II  Anesthesia Plan: General   Post-op Pain Management:    Induction: Intravenous  Airway Management Planned: Oral ETT  Additional Equipment:   Intra-op Plan:   Post-operative Plan: Extubation in OR  Informed Consent: I have reviewed the patients History and Physical, chart, labs and discussed the procedure including the risks, benefits and alternatives for the proposed anesthesia with the patient or authorized representative who has indicated his/her understanding and acceptance.   Dental advisory given  Plan Discussed with: Surgeon and CRNA  Anesthesia Plan Comments:        Anesthesia Quick Evaluation

## 2016-10-25 NOTE — Op Note (Signed)
10/25/2016  12:36 PM  PATIENT:  Katelyn [name]  61 y.o. female  PRE-OPERATIVE DIAGNOSIS:  Lumbar wound dehiscence  POST-OPERATIVE DIAGNOSIS:  same  PROCEDURE:  Irrigation and debridement with primary revision of lumbar wound, including deep debridement, then 12 cm  SURGEON:  Sherley Bounds, MD  ASSISTANTS: none  ANESTHESIA:   General  EBL: 25 ml  No intake/output data recorded.  BLOOD ADMINISTERED: none  DRAINS: None  SPECIMEN:  none  INDICATION FOR PROCEDURE: This patient presented with dehiscence of the inferior part of her surgical wound. The patient tried conservative measures without relief. . Recommended irrigation and debridement of her wound. Patient understood the risks, benefits, and alternatives and potential outcomes and wished to proceed.  PROCEDURE DETAILS: The patient was taken to the operating room and after induction of adequate generalized endotracheal anesthesia she was rolled into the prone position on the Wilson frame and all pressure points were padded. Her lumbar region was cleaned and then prepped with DuraPrep and then draped in usual sterile fashion. Her old incision was opened. The tissues were clean and healthy looking, only the inferior part of the wound has some  morcellized allograft that had come down from an opening in the fascia at the fusion level. I saw no exudate or significant infection. The tissues were debrided and then irrigated with copious amounts of bacitracin containing saline solution. I inspected the hardware on her right hand side. It had good purchase and was clean. Vancomycin powder was placed into the wound and I then closed the small areas of fascia with 0 Vicryl. I closed the subcutaneous tissues with interrupted 0 and 2-0 Vicryl. I tried to close the dead space as I went. I closed the subcuticular tissue with 3-0 Vicryl. The skin was closed with Dermabond and benzoin and Steri-Strips. A sterile dressing was applied. The patient was awakened  from general anesthesia and transported to the recovery room in stable condition. At the end of the procedure all sponge needle and instrument counts were correct.   PLAN OF CARE: Admit for overnight observation  PATIENT DISPOSITION:  PACU - hemodynamically stable.   Delay start of Pharmacological VTE agent (>24hrs) due to surgical blood loss or risk of bleeding:  yes

## 2016-10-25 NOTE — Transfer of Care (Signed)
Immediate Anesthesia Transfer of Care Note  Patient: Katelyn [name]  Procedure(s) Performed: Procedure(s) with comments: Lumbar wound revision (N/A) - Lumbar wound revision  Patient Location: PACU  Anesthesia Type:General  Level of Consciousness: awake, oriented and patient cooperative  Airway & Oxygen Therapy: Patient Spontanous Breathing and Patient connected to nasal cannula oxygen  Post-op Assessment: Report given to RN and Post -op Vital signs reviewed and stable  Post vital signs: Reviewed  Last Vitals:  Vitals:   10/25/16 1244  BP: 138/65  Temp: 36.3 C    Last Pain:  Vitals:   10/25/16 0934  PainSc: 4       Patients Stated Pain Goal:  (meds by crna) ([id number])  Complications: No apparent anesthesia complications

## 2016-10-25 NOTE — Anesthesia Postprocedure Evaluation (Signed)
Anesthesia Post Note  Patient: Katelyn [name]  Procedure(s) Performed: Procedure(s) (LRB): Lumbar wound revision (N/A)  Patient location during evaluation: PACU Anesthesia Type: General Level of consciousness: awake and alert Pain management: pain level controlled Vital Signs Assessment: post-procedure vital signs reviewed and stable Respiratory status: spontaneous breathing, nonlabored ventilation, respiratory function stable and patient connected to nasal cannula oxygen Cardiovascular status: blood pressure returned to baseline and stable Postop Assessment: no signs of nausea or vomiting Anesthetic complications: no       Last Vitals:  Vitals:   10/25/16 1437 10/25/16 1512  BP:    Pulse:  86  Resp:    Temp: 36.5 C 36.5 C    Last Pain:  Vitals:   10/25/16 1512  TempSrc: Oral  PainSc:                  [name] DAVID

## 2016-10-25 NOTE — Anesthesia Procedure Notes (Signed)
Procedure Name: Intubation Date/Time: 10/25/2016 11:42 AM Performed by: Jenne Campus Pre-anesthesia Checklist: Patient identified, Emergency Drugs available, Suction available and Patient being monitored Patient Re-evaluated:Patient Re-evaluated prior to inductionOxygen Delivery Method: Circle System Utilized Preoxygenation: Pre-oxygenation with 100% oxygen Intubation Type: IV induction Ventilation: Mask ventilation without difficulty Laryngoscope Size: Glidescope Tube type: Oral Tube size: 7.5 mm Number of attempts: 3 Airway Equipment and Method: Stylet and Oral airway Placement Confirmation: ETT inserted through vocal cords under direct vision,  positive ETCO2 and breath sounds checked- equal and bilateral Secured at: 21 cm Tube secured with: Tape Dental Injury: Teeth and Oropharynx as per pre-operative assessment  Difficulty Due To: Difficulty was unanticipated Comments: Smooth IV induction. Easy mask. DL x 1 CRNA JRock Mil2. Grade 1 view. Patient has small mouth and pallet. Unable to direct ETT through cords. DL x 2 Dr Venida Jarvis MAC 3. Grade 1 view. Esophageal intubations immediately recognized and patient remasked before each subsequent DL. Glidescope intubation by Dr Conrad [place]. #3 blade. Grade 1 view. Atraumatic intubation. +ETCO2 bbse. OGT droppped to decompress stomach.

## 2016-10-25 NOTE — H&P (Signed)
Subjective: Patient is a 61 y.o. female admitted for wound dehiscence. Onset of symptoms was 2 weeks ago, gradually worsening since that time.  The pain is rated mild, and is located at the across the lower back. s/p lumbar fusion 4 wks ago.The pain is described as aching and occurs intermittently. The symptoms have been progressive. Symptoms are exacerbated by nothing in particular.   Past Medical History:  Diagnosis Date  . Anemia    previous transfusion 2/17  . Anxiety   . Arthritis   . Bursitis of right hip   . CAD (coronary artery disease)    Cardiac catheterization June 2014 in [facility] - 50% circumflex stenosis  . Chronic kidney disease    does not see nephrologist  . Complication of anesthesia   . Depression   . Dyspnea    with exertion   " lazy lung" - per  [name] from back issues- 06/2016  . Essential hypertension   . GERD (gastroesophageal reflux disease)   . H/O hiatal hernia   . Heart murmur   . History of blood transfusion 2016  . History of kidney stones   . Hypercholesterolemia   . Left bundle branch block   . Lumbar stenosis   . Neuropathy ([place])   . PONV (postoperative nausea and vomiting)    "no N/V with patch"  . Restless leg syndrome   . Sleep apnea    mild case  no cpap  . Type 2 diabetes mellitus ([place])     Past Surgical History:  Procedure Laterality Date  . ABDOMINAL HYSTERECTOMY  1983  . APPENDECTOMY  Age 55  . BACK SURGERY     FIRST LUMBAR FUSION/ SURGERY APRIL 2012 AND FUSION WITH INSTRUMENTATION SEPT 2012  . CARDIAC CATHETERIZATION     x 2  . CARPAL TUNNEL RELEASE     bil  . CHOLECYSTECTOMY  1990's  . CYSTO EXTRACTION KIDNEY STONES    . EXCISION/RELEASE BURSA HIP  02/02/2012   Procedure: EXCISION/RELEASE BURSA HIP;  Surgeon: Tobi Bastos, MD;  Location: [facility]S;  Service: Orthopedics;  Laterality: Right;  Right Hip Bursectomy  . EYE SURGERY Bilateral    cataracts  . KIDNEY STONE SURGERY  2008  . Left knee surgery x 2  1996    reconstruction  . LUMBAR DISC SURGERY  08/2016  . LUMBAR LAMINECTOMY/DECOMPRESSION MICRODISCECTOMY Right 11/26/2015   Procedure: Extraforaminal Microdiscectomy  - Lumbar two-three- right;  Surgeon: Eustace Moore, MD;  Location: [place] [facility];  Service: Neurosurgery;  Laterality: Right;  right   . Right shoulder surgery  2010   spur    Prior to Admission medications   Medication Sig Start Date End Date Taking? Authorizing Provider  aspirin 81 MG tablet Take 81 mg by mouth at bedtime.    Yes Historical Provider, MD  aspirin-acetaminophen-caffeine (EXCEDRIN MIGRAINE) [phone] MG tablet Take 2 tablets by mouth every 6 (six) hours as needed for headache.   Yes Historical Provider, MD  CINNAMON PO Take 2,000 mg by mouth daily.   Yes Historical Provider, MD  doxycycline (ADOXA) 100 MG tablet Take 100 mg by mouth every 12 (twelve) hours. 10/16/16  Yes Historical Provider, MD  DULoxetine (CYMBALTA) 60 MG capsule Take 60 mg by mouth at bedtime.    Yes Historical Provider, MD  famotidine (PEPCID) 20 MG tablet Take 20 mg by mouth 3 (three) times daily.   Yes Historical Provider, MD  furosemide (LASIX) 20 MG tablet Take 20 mg by mouth  daily.   Yes Historical Provider, MD  gabapentin (NEURONTIN) 100 MG capsule Take 100 mg by mouth 2 (two) times daily.    Yes Historical Provider, MD  insulin aspart (NOVOLOG) 100 UNIT/ML injection Inject 25-35 Units into the skin 3 (three) times daily before meals. If blood sugar is over 200 inject 25 units, if BS is over 300 inject 30 units, if BS is over 400 inject 35 units.   Yes Historical Provider, MD  insulin detemir (LEVEMIR) 100 UNIT/ML injection Inject 65 Units into the skin at bedtime.   Yes Historical Provider, MD  lisinopril-hydrochlorothiazide (PRINZIDE,ZESTORETIC) 20-12.5 MG per tablet Take 1 tablet by mouth daily after breakfast.   Yes Historical Provider, MD  methocarbamol (ROBAXIN) 500 MG tablet Take 1 tablet (500 mg total) by mouth every 8 (eight) hours as  needed for muscle spasms. 08/30/16  Yes Eustace Moore, MD  mirtazapine (REMERON) 15 MG tablet Take 15 mg by mouth at bedtime.   Yes Historical Provider, MD  Oxycodone HCl 10 MG TABS Take 10 mg by mouth every 6 (six) hours as needed (pain).   Yes Historical Provider, MD  pramipexole (MIRAPEX) 0.5 MG tablet Take 1 mg by mouth at bedtime.  07/23/16  Yes Historical Provider, MD  ranitidine (ZANTAC) 150 MG tablet Take 150 mg by mouth at bedtime.   Yes Historical Provider, MD  nitroGLYCERIN (NITROSTAT) 0.4 MG SL tablet Place 0.4 mg under the tongue every 5 (five) minutes as needed for chest pain.     Historical Provider, MD  oxyCODONE-acetaminophen (PERCOCET/ROXICET) 5-325 MG tablet Take 1-2 tablets by mouth every 4 (four) hours as needed for moderate pain. Patient not taking: Reported on 10/23/2016 08/30/16   Eustace Moore, MD  PROAIR RESPICLICK [phone] Base) MCG/ACT AEPB Inhale 1 puff into the lungs every 6 (six) hours as needed (shortness of breath).  05/24/16   Historical Provider, MD  trolamine salicylate (ASPERCREME) 10 % cream Apply 1 application topically as needed for muscle pain.    Historical Provider, MD   Allergies  Allergen Reactions  . Atorvastatin Nausea And Vomiting and Other (See Comments)    MYALGIAS  . Talwin [Pentazocine] Other (See Comments)    headache  . Other Nausea Only    UNSPECIFIED Anesthesia    Social History  Substance Use Topics  . Smoking status: Never Smoker  . Smokeless tobacco: Never Used  . Alcohol use 0.0 oz/week     Comment: Rarely    Family History  Problem Relation Age of Onset  . Lung cancer Father     smoked  . Hypertension Father   . Stroke Father   . Heart failure Mother   . Bone cancer Sister   . Asthma Sister      Review of Systems  Positive ROS: neg  All other systems have been reviewed and were otherwise negative with the exception of those mentioned in the HPI and as above.  Objective: Vital signs in last 24 hours: Weight:  [87.1  kg (192 lb)] 87.1 kg (192 lb) (01/17 0937)  General Appearance: Alert, cooperative, no distress, appears stated age Head: Normocephalic, without obvious abnormality, atraumatic Eyes: PERRL, conjunctiva/corneas clear, EOM's intact    Neck: Supple, symmetrical, trachea midline Back: Symmetric, no curvature, ROM normal, no CVA tenderness Lungs:  respirations unlabored Heart: Regular rate and rhythm Abdomen: Soft, non-tender Extremities: Extremities normal, atraumatic, no cyanosis or edema Pulses: 2+ and symmetric all extremities Skin: Skin color, texture, turgor normal, no rashes or lesions  NEUROLOGIC:   Mental status: Alert and oriented x4,  no aphasia, good attention span, fund of knowledge, and memory Motor Exam - grossly normal Sensory Exam - grossly normal Reflexes: 1+ Coordination - grossly normal Gait - grossly normal Balance - grossly normal Cranial Nerves: I: smell Not tested  II: visual acuity  OS: nl    OD: nl  II: visual fields Full to confrontation  II: pupils Equal, round, reactive to light  III,VII: ptosis None  III,IV,VI: extraocular muscles  Full ROM  V: mastication Normal  V: facial light touch sensation  Normal  V,VII: corneal reflex  Present  VII: facial muscle function - upper  Normal  VII: facial muscle function - lower Normal  VIII: hearing Not tested  IX: soft palate elevation  Normal  IX,X: gag reflex Present  XI: trapezius strength  5/5  XI: sternocleidomastoid strength 5/5  XI: neck flexion strength  5/5  XII: tongue strength  Normal    Data Review Lab Results  Component Value Date   WBC 5.7 10/25/2016   HGB 10.6 (L) 10/25/2016   HCT 32.8 (L) 10/25/2016   MCV 91.4 10/25/2016   PLT 323 10/25/2016   Lab Results  Component Value Date   NA 137 10/25/2016   K 4.6 10/25/2016   CL 105 10/25/2016   CO2 22 10/25/2016   BUN 30 (H) 10/25/2016   CREATININE 1.07 (H) 10/25/2016   GLUCOSE 140 (H) 10/25/2016   Lab Results  Component Value Date    INR 0.95 08/21/2016    Assessment/Plan: Patient admitted for lumbar wound revision. Patient has failed a reasonable attempt at conservative therapy.  I explained the condition and procedure to the patient and answered any questions.  Patient wishes to proceed with procedure as planned. Understands risks/ benefits and typical outcomes of procedure.   Dottie Vaquerano S 10/25/2016 10:43 AM

## 2016-10-26 ENCOUNTER — Encounter (HOSPITAL_COMMUNITY): Payer: Self-pay | Admitting: Neurological Surgery

## 2016-10-26 DIAGNOSIS — T8130XA Disruption of wound, unspecified, initial encounter: Secondary | ICD-10-CM | POA: Diagnosis not present

## 2016-10-26 LAB — GLUCOSE, CAPILLARY: Glucose-Capillary: 223 mg/dL — ABNORMAL HIGH (ref 65–99)

## 2016-10-26 MED ORDER — OXYCODONE HCL 10 MG PO TABS: 10.0000 mg | ORAL_TABLET | Freq: Four times a day (QID) | ORAL | 0 refills | Status: DC | PRN

## 2016-10-26 NOTE — Progress Notes (Signed)
Patient alert and oriented, mae's well, voiding adequate amount of urine, swallowing without difficulty, no c/o pain. Patient discharged home with family. Script and discharged instructions given to patient. Patient and family stated understanding of d/c instructions given and has an appointment with MD. 

## 2016-10-26 NOTE — Addendum Note (Signed)
Addendum  created 10/26/16 0618 by Jenne Campus, CRNA   Anesthesia Intra Meds edited

## 2016-10-26 NOTE — Discharge Summary (Signed)
Physician Discharge Summary  Patient ID: Katelyn [name] MRN: [id number] DOB/AGE: 1956/05/18 61 y.o.  Admit date: 10/25/2016 Discharge date: 10/26/2016  Admission Diagnoses: wound dehiscence    Discharge Diagnoses: same   Discharged Condition: good  Hospital Course: The patient was admitted on 10/25/2016 and taken to the operating room where the patient underwent wound revision. The patient tolerated the procedure well and was taken to the recovery room and then to the floor in stable condition. The hospital course was routine. There were no complications. The wound remained clean dry and intact. Pt had appropriate back soreness. No complaints of leg pain or new N/T/W. The patient remained afebrile with stable vital signs, and tolerated a regular diet. The patient continued to increase activities, and pain was well controlled with oral pain medications.   Consults: None  Significant Diagnostic Studies:  Results for orders placed or performed during the hospital encounter of 10/25/16  CBC  Result Value Ref Range   WBC 5.7 4.0 - 10.5 K/uL   RBC 3.59 (L) 3.87 - 5.11 MIL/uL   Hemoglobin 10.6 (L) 12.0 - 15.0 g/dL   HCT 32.8 (L) 36.0 - 46.0 %   MCV 91.4 78.0 - 100.0 fL   MCH 29.5 26.0 - 34.0 pg   MCHC 32.3 30.0 - 36.0 g/dL   RDW 13.9 11.5 - 15.5 %   Platelets 323 150 - 400 K/uL  Basic metabolic panel  Result Value Ref Range   Sodium 137 135 - 145 mmol/L   Potassium 4.6 3.5 - 5.1 mmol/L   Chloride 105 101 - 111 mmol/L   CO2 22 22 - 32 mmol/L   Glucose, Bld 140 (H) 65 - 99 mg/dL   BUN 30 (H) 6 - 20 mg/dL   Creatinine, Ser 1.07 (H) 0.44 - 1.00 mg/dL   Calcium 9.5 8.9 - 10.3 mg/dL   GFR calc non Af Amer 56 (L) >60 mL/min   GFR calc Af Amer >60 >60 mL/min   Anion gap 10 5 - 15  Glucose, capillary  Result Value Ref Range   Glucose-Capillary 148 (H) 65 - 99 mg/dL  Glucose, capillary  Result Value Ref Range   Glucose-Capillary 119 (H) 65 - 99 mg/dL   Comment 1 Notify RN    Comment 2  Document in Chart   Glucose, capillary  Result Value Ref Range   Glucose-Capillary 362 (H) 65 - 99 mg/dL  Glucose, capillary  Result Value Ref Range   Glucose-Capillary 340 (H) 65 - 99 mg/dL   Comment 1 Notify RN    Comment 2 Document in Chart   Glucose, capillary  Result Value Ref Range   Glucose-Capillary 223 (H) 65 - 99 mg/dL   Comment 1 Notify RN    Comment 2 Document in Chart     No results found.  Antibiotics:  Anti-infectives    Start     Dose/Rate Route Frequency Ordered Stop   10/25/16 2000  ceFAZolin (ANCEF) IVPB 1 g/50 mL premix     1 g 100 mL/hr over 30 Minutes Intravenous Every 8 hours 10/25/16 1500 10/26/16 0345   10/25/16 1200  vancomycin (VANCOCIN) powder  Status:  Discontinued       As needed 10/25/16 1201 10/25/16 1244   10/25/16 1156  bacitracin 50,000 Units in sodium chloride irrigation 0.9 % 500 mL irrigation  Status:  Discontinued       As needed 10/25/16 1158 10/25/16 1244   10/25/16 0922  ceFAZolin (ANCEF) 2-4 GM/100ML-% IVPB  CommentsConnye Burkitt   : cabinet override      10/25/16 0922 10/25/16 1205   10/25/16 0920  ceFAZolin (ANCEF) IVPB 2g/100 mL premix     2 g 200 mL/hr over 30 Minutes Intravenous On call to O.R. 10/25/16 0920 10/25/16 1205      Discharge Exam: Blood pressure (!) 115/52, pulse 78, temperature 98.3 F (36.8 C), resp. rate 18, height 5\' 2"  (1.575 m), weight 87.1 kg (192 lb), SpO2 100 %. Neurologic: Grossly normal Dressing dry  Discharge Medications:   Allergies as of 10/26/2016      Reactions   Atorvastatin Nausea And Vomiting, Other (See Comments)   MYALGIAS   Talwin [pentazocine] Other (See Comments)   headache   Other Nausea Only   UNSPECIFIED Anesthesia      Medication List    STOP taking these medications   oxyCODONE-acetaminophen 5-325 MG tablet Commonly known as:  PERCOCET/ROXICET     TAKE these medications   aspirin 81 MG tablet Take 81 mg by mouth at bedtime.   aspirin-acetaminophen-caffeine  250-250-65 MG tablet Commonly known as:  EXCEDRIN MIGRAINE Take 2 tablets by mouth every 6 (six) hours as needed for headache.   CINNAMON PO Take 2,000 mg by mouth daily.   doxycycline 100 MG tablet Commonly known as:  ADOXA Take 100 mg by mouth every 12 (twelve) hours.   DULoxetine 60 MG capsule Commonly known as:  CYMBALTA Take 60 mg by mouth at bedtime.   famotidine 20 MG tablet Commonly known as:  PEPCID Take 20 mg by mouth 3 (three) times daily.   furosemide 20 MG tablet Commonly known as:  LASIX Take 20 mg by mouth daily.   gabapentin 100 MG capsule Commonly known as:  NEURONTIN Take 100 mg by mouth 2 (two) times daily.   insulin aspart 100 UNIT/ML injection Commonly known as:  novoLOG Inject 25-35 Units into the skin 3 (three) times daily before meals. If blood sugar is over 200 inject 25 units, if BS is over 300 inject 30 units, if BS is over 400 inject 35 units.   insulin detemir 100 UNIT/ML injection Commonly known as:  LEVEMIR Inject 65 Units into the skin at bedtime.   lisinopril-hydrochlorothiazide 20-12.5 MG tablet Commonly known as:  PRINZIDE,ZESTORETIC Take 1 tablet by mouth daily after breakfast.   methocarbamol 500 MG tablet Commonly known as:  ROBAXIN Take 1 tablet (500 mg total) by mouth every 8 (eight) hours as needed for muscle spasms.   mirtazapine 15 MG tablet Commonly known as:  REMERON Take 15 mg by mouth at bedtime.   nitroGLYCERIN 0.4 MG SL tablet Commonly known as:  NITROSTAT Place 0.4 mg under the tongue every 5 (five) minutes as needed for chest pain.   Oxycodone HCl 10 MG Tabs Take 1-2 tablets (10-20 mg total) by mouth every 6 (six) hours as needed (pain). What changed:  how much to take   pramipexole 0.5 MG tablet Commonly known as:  MIRAPEX Take 1 mg by mouth at bedtime.   PROAIR RESPICLICK 123XX123 (90 Base) MCG/ACT Aepb Generic drug:  Albuterol Sulfate Inhale 1 puff into the lungs every 6 (six) hours as needed (shortness of  breath).   ranitidine 150 MG tablet Commonly known as:  ZANTAC Take 150 mg by mouth at bedtime.   trolamine salicylate 10 % cream Commonly known as:  ASPERCREME Apply 1 application topically as needed for muscle pain.       Disposition: home   Final Dx: wound  revision  Discharge Instructions     Remove dressing in 72 hours    Complete by:  As directed    Call MD for:  difficulty breathing, headache or visual disturbances    Complete by:  As directed    Call MD for:  persistant nausea and vomiting    Complete by:  As directed    Call MD for:  redness, tenderness, or signs of infection (pain, swelling, redness, odor or green/yellow discharge around incision site)    Complete by:  As directed    Call MD for:  severe uncontrolled pain    Complete by:  As directed    Call MD for:  temperature >100.4    Complete by:  As directed    Diet - low sodium heart healthy    Complete by:  As directed    Increase activity slowly    Complete by:  As directed          Signed: Israa Caban S 10/26/2016, 9:52 AM

## 2016-10-31 LAB — AEROBIC/ANAEROBIC CULTURE (SURGICAL/DEEP WOUND)

## 2016-10-31 LAB — AEROBIC/ANAEROBIC CULTURE W GRAM STAIN (SURGICAL/DEEP WOUND)

## 2016-11-22 DIAGNOSIS — Z0000 Encounter for general adult medical examination without abnormal findings: Secondary | ICD-10-CM | POA: Diagnosis not present

## 2016-12-05 DIAGNOSIS — H9319 Tinnitus, unspecified ear: Secondary | ICD-10-CM

## 2016-12-05 DIAGNOSIS — M48062 Spinal stenosis, lumbar region with neurogenic claudication: Secondary | ICD-10-CM | POA: Insufficient documentation

## 2016-12-05 DIAGNOSIS — T8130XA Disruption of wound, unspecified, initial encounter: Secondary | ICD-10-CM | POA: Diagnosis not present

## 2016-12-05 DIAGNOSIS — G5710 Meralgia paresthetica, unspecified lower limb: Secondary | ICD-10-CM

## 2016-12-05 DIAGNOSIS — R748 Abnormal levels of other serum enzymes: Secondary | ICD-10-CM

## 2016-12-05 HISTORY — DX: Tinnitus, unspecified ear: H93.19

## 2016-12-05 HISTORY — DX: Meralgia paresthetica, unspecified lower limb: G57.10

## 2016-12-05 HISTORY — DX: Abnormal levels of other serum enzymes: R74.8

## 2016-12-12 DIAGNOSIS — F3340 Major depressive disorder, recurrent, in remission, unspecified: Secondary | ICD-10-CM | POA: Diagnosis not present

## 2016-12-12 DIAGNOSIS — G2581 Restless legs syndrome: Secondary | ICD-10-CM | POA: Diagnosis not present

## 2017-01-01 DIAGNOSIS — E785 Hyperlipidemia, unspecified: Secondary | ICD-10-CM | POA: Insufficient documentation

## 2017-01-01 DIAGNOSIS — I110 Hypertensive heart disease with heart failure: Secondary | ICD-10-CM | POA: Insufficient documentation

## 2017-01-01 DIAGNOSIS — I447 Left bundle-branch block, unspecified: Secondary | ICD-10-CM | POA: Insufficient documentation

## 2017-01-01 HISTORY — DX: Hypertensive heart disease with heart failure: I11.0

## 2017-01-01 HISTORY — DX: Left bundle-branch block, unspecified: I44.7

## 2017-01-04 DIAGNOSIS — R079 Chest pain, unspecified: Secondary | ICD-10-CM | POA: Diagnosis not present

## 2017-01-04 DIAGNOSIS — I447 Left bundle-branch block, unspecified: Secondary | ICD-10-CM | POA: Diagnosis not present

## 2017-01-04 DIAGNOSIS — I2510 Atherosclerotic heart disease of native coronary artery without angina pectoris: Secondary | ICD-10-CM | POA: Diagnosis not present

## 2017-01-04 DIAGNOSIS — R0602 Shortness of breath: Secondary | ICD-10-CM | POA: Diagnosis not present

## 2017-01-04 DIAGNOSIS — I10 Essential (primary) hypertension: Secondary | ICD-10-CM | POA: Diagnosis not present

## 2017-01-08 DIAGNOSIS — M48062 Spinal stenosis, lumbar region with neurogenic claudication: Secondary | ICD-10-CM | POA: Diagnosis not present

## 2017-01-08 DIAGNOSIS — R0602 Shortness of breath: Secondary | ICD-10-CM | POA: Diagnosis not present

## 2017-01-08 DIAGNOSIS — R7989 Other specified abnormal findings of blood chemistry: Secondary | ICD-10-CM | POA: Diagnosis not present

## 2017-01-16 DIAGNOSIS — M109 Gout, unspecified: Secondary | ICD-10-CM | POA: Diagnosis not present

## 2017-01-16 DIAGNOSIS — E1142 Type 2 diabetes mellitus with diabetic polyneuropathy: Secondary | ICD-10-CM | POA: Diagnosis not present

## 2017-01-16 DIAGNOSIS — I10 Essential (primary) hypertension: Secondary | ICD-10-CM | POA: Diagnosis not present

## 2017-01-16 DIAGNOSIS — R740 Nonspecific elevation of levels of transaminase and lactic acid dehydrogenase [LDH]: Secondary | ICD-10-CM | POA: Diagnosis not present

## 2017-01-16 DIAGNOSIS — E1149 Type 2 diabetes mellitus with other diabetic neurological complication: Secondary | ICD-10-CM | POA: Diagnosis not present

## 2017-01-16 DIAGNOSIS — M5416 Radiculopathy, lumbar region: Secondary | ICD-10-CM | POA: Diagnosis not present

## 2017-01-16 DIAGNOSIS — K222 Esophageal obstruction: Secondary | ICD-10-CM | POA: Diagnosis not present

## 2017-01-16 DIAGNOSIS — E1140 Type 2 diabetes mellitus with diabetic neuropathy, unspecified: Secondary | ICD-10-CM | POA: Insufficient documentation

## 2017-01-16 DIAGNOSIS — F329 Major depressive disorder, single episode, unspecified: Secondary | ICD-10-CM | POA: Diagnosis not present

## 2017-01-16 DIAGNOSIS — E784 Other hyperlipidemia: Secondary | ICD-10-CM | POA: Diagnosis not present

## 2017-01-16 DIAGNOSIS — I2510 Atherosclerotic heart disease of native coronary artery without angina pectoris: Secondary | ICD-10-CM | POA: Diagnosis not present

## 2017-01-16 DIAGNOSIS — D649 Anemia, unspecified: Secondary | ICD-10-CM | POA: Diagnosis not present

## 2017-01-16 DIAGNOSIS — N08 Glomerular disorders in diseases classified elsewhere: Secondary | ICD-10-CM | POA: Diagnosis not present

## 2017-01-17 DIAGNOSIS — R0602 Shortness of breath: Secondary | ICD-10-CM | POA: Diagnosis not present

## 2017-01-31 DIAGNOSIS — I10 Essential (primary) hypertension: Secondary | ICD-10-CM | POA: Diagnosis not present

## 2017-01-31 DIAGNOSIS — I2510 Atherosclerotic heart disease of native coronary artery without angina pectoris: Secondary | ICD-10-CM | POA: Diagnosis not present

## 2017-01-31 DIAGNOSIS — I447 Left bundle-branch block, unspecified: Secondary | ICD-10-CM | POA: Diagnosis not present

## 2017-01-31 DIAGNOSIS — R0602 Shortness of breath: Secondary | ICD-10-CM | POA: Diagnosis not present

## 2017-02-06 DIAGNOSIS — N183 Chronic kidney disease, stage 3 (moderate): Secondary | ICD-10-CM | POA: Diagnosis not present

## 2017-02-06 DIAGNOSIS — E1140 Type 2 diabetes mellitus with diabetic neuropathy, unspecified: Secondary | ICD-10-CM | POA: Diagnosis not present

## 2017-02-06 DIAGNOSIS — Z6836 Body mass index (BMI) 36.0-36.9, adult: Secondary | ICD-10-CM | POA: Diagnosis not present

## 2017-02-06 DIAGNOSIS — I10 Essential (primary) hypertension: Secondary | ICD-10-CM | POA: Diagnosis not present

## 2017-02-08 DIAGNOSIS — R0602 Shortness of breath: Secondary | ICD-10-CM | POA: Diagnosis not present

## 2017-02-08 DIAGNOSIS — I2510 Atherosclerotic heart disease of native coronary artery without angina pectoris: Secondary | ICD-10-CM | POA: Diagnosis not present

## 2017-02-26 DIAGNOSIS — H5203 Hypermetropia, bilateral: Secondary | ICD-10-CM | POA: Diagnosis not present

## 2017-02-26 DIAGNOSIS — E119 Type 2 diabetes mellitus without complications: Secondary | ICD-10-CM | POA: Diagnosis not present

## 2017-03-02 DIAGNOSIS — M48062 Spinal stenosis, lumbar region with neurogenic claudication: Secondary | ICD-10-CM | POA: Diagnosis not present

## 2017-03-02 DIAGNOSIS — Z981 Arthrodesis status: Secondary | ICD-10-CM | POA: Diagnosis not present

## 2017-03-06 DIAGNOSIS — M545 Low back pain: Secondary | ICD-10-CM | POA: Diagnosis not present

## 2017-03-06 DIAGNOSIS — Z6835 Body mass index (BMI) 35.0-35.9, adult: Secondary | ICD-10-CM | POA: Diagnosis not present

## 2017-03-06 DIAGNOSIS — I10 Essential (primary) hypertension: Secondary | ICD-10-CM | POA: Diagnosis not present

## 2017-03-06 DIAGNOSIS — M48062 Spinal stenosis, lumbar region with neurogenic claudication: Secondary | ICD-10-CM | POA: Diagnosis not present

## 2017-03-20 DIAGNOSIS — Z6836 Body mass index (BMI) 36.0-36.9, adult: Secondary | ICD-10-CM | POA: Diagnosis not present

## 2017-03-20 DIAGNOSIS — N183 Chronic kidney disease, stage 3 (moderate): Secondary | ICD-10-CM | POA: Diagnosis not present

## 2017-03-20 DIAGNOSIS — I10 Essential (primary) hypertension: Secondary | ICD-10-CM | POA: Diagnosis not present

## 2017-03-20 DIAGNOSIS — E1140 Type 2 diabetes mellitus with diabetic neuropathy, unspecified: Secondary | ICD-10-CM | POA: Diagnosis not present

## 2017-04-19 ENCOUNTER — Ambulatory Visit (INDEPENDENT_AMBULATORY_CARE_PROVIDER_SITE_OTHER): Payer: PPO | Admitting: Adult Health

## 2017-04-19 ENCOUNTER — Other Ambulatory Visit: Payer: PPO

## 2017-04-19 ENCOUNTER — Encounter: Payer: Self-pay | Admitting: Adult Health

## 2017-04-19 VITALS — BP 122/80 | HR 75 | Ht 62.0 in | Wt 206.6 lb

## 2017-04-19 DIAGNOSIS — R0602 Shortness of breath: Secondary | ICD-10-CM | POA: Diagnosis not present

## 2017-04-19 DIAGNOSIS — R0609 Other forms of dyspnea: Secondary | ICD-10-CM | POA: Diagnosis not present

## 2017-04-19 DIAGNOSIS — R059 Cough, unspecified: Secondary | ICD-10-CM

## 2017-04-19 DIAGNOSIS — R05 Cough: Secondary | ICD-10-CM | POA: Diagnosis not present

## 2017-04-19 HISTORY — DX: Cough, unspecified: R05.9

## 2017-04-19 NOTE — Assessment & Plan Note (Signed)
DOE ? Etiology , felt to be component of deconditioning  Workup has been unrevealing with neg VQ scan , PFT and CXR , reported neg card w/up  Would cont exrecise regimen as tolerate  Plan  Patient Instructions  Discuss with your primary doctor that lisinopril pain, need making your cough worse. May use Mucinex DM twice daily as needed for cough and congestion Zyrtec 10 mg at bedtime as needed for drainage Saline nasal spray as needed. Lab tests today Activity as tolerated. Follow with Dr. [name] in 3-4 months and As needed   Please contact office for sooner follow up if symptoms do not improve or worsen or seek emergency care

## 2017-04-19 NOTE — Progress Notes (Signed)
@Patient  ID: Katelyn [name], female    DOB: 04-20-1956, 61 y.o.   MRN: [id number]  Chief Complaint  Patient presents with  . Follow-up    Dyspnea    Referring provider: Algis Greenhouse, MD  HPI: 61 year old female seen for pulmonary consult 07/04/2016 for dyspnea on exertion.  Pulmonary tests V/Q scan 03/07/16 >> low probability for PE PFT 04/04/16 >> FEV1 1.77 (74%), FEV1% 87, TLC 3.69 (77%), DLCO 69%, no BD Chest x-ray February 2017. Lungs were clear  Cardiac tests Doppler legs b/l 03/07/16 >> no DVT  04/19/2017 Follow up : DOE  Patient returns for a follow-up for dyspnea. Patient was seen for pulmonary consult in September 2017. She under went a workup for dyspnea with activity. VQ scan showed low probability for PE. Pulmonary function test showed mild restriction. No airflow obstruction with an FEV1 at 87%. Patient was found to have a component of deconditioning.   Patient returns and says that she continues to have shortness of breath with walking. She gets winded easily Patient did have back surgery in November. She had difficulty with wound infection and had to have surgery again in Jan 2018.   She now is doing better with PT. She is exercising at [employer] with water exercises in May . Marland Kitchen Says she does well with bike . However she can not walk long distance without giving out .   Was seen by cardiology at [facility] , stress test and echo in May , told they were ok . Results not availble. -requested.  Says she had HST , told she did not need CPAP . No results availble .   Says she has FH hx of emphysema . She has never smoker , + second hand smoke exposure.  Was told to have alpha one test with our office .   Complains of daily cough and throat clearing . Worse in am . No discolored mucus or fever.     Allergies  Allergen Reactions  . Atorvastatin Nausea And Vomiting and Other (See Comments)    MYALGIAS  . Talwin [Pentazocine] Other (See Comments)    headache  . Other Nausea Only      UNSPECIFIED Anesthesia    There is no immunization history for the selected administration types on file for this patient.  Past Medical History:  Diagnosis Date  . Anemia    previous transfusion 2/17  . Anxiety   . Arthritis   . Bursitis of right hip   . CAD (coronary artery disease)    Cardiac catheterization June 2014 in [facility] - 50% circumflex stenosis  . Chronic kidney disease    does not see nephrologist  . Complication of anesthesia   . Depression   . Dyspnea    with exertion   " lazy lung" - per  [name] from back issues- 06/2016  . Essential hypertension   . GERD (gastroesophageal reflux disease)   . H/O hiatal hernia   . Heart murmur   . History of blood transfusion 2016  . History of kidney stones   . Hypercholesterolemia   . Left bundle branch block   . Lumbar stenosis   . Neuropathy   . PONV (postoperative nausea and vomiting)    "no N/V with patch"  . Restless leg syndrome   . Sleep apnea    mild case  no cpap  . Type 2 diabetes mellitus (HCC)     Tobacco History: History  Smoking Status  . Never  Smoker  Smokeless Tobacco  . Never Used   Counseling given: Not Answered   Outpatient Encounter Prescriptions as of 04/19/2017  Medication Sig  . aspirin 81 MG tablet Take 81 mg by mouth at bedtime.   Marland Kitchen doxycycline (ADOXA) 100 MG tablet Take 100 mg by mouth every 12 (twelve) hours.  . DULoxetine (CYMBALTA) 60 MG capsule Take 60 mg by mouth at bedtime.   . furosemide (LASIX) 20 MG tablet Take 20 mg by mouth daily.  Marland Kitchen gabapentin (NEURONTIN) 100 MG capsule Take 100 mg by mouth 2 (two) times daily.   . insulin aspart (NOVOLOG) 100 UNIT/ML injection Inject 25-35 Units into the skin 3 (three) times daily before meals. If blood sugar is over 200 inject 25 units, if BS is over 300 inject 30 units, if BS is over 400 inject 35 units.  Marland Kitchen lisinopril-hydrochlorothiazide (PRINZIDE,ZESTORETIC) 20-12.5 MG per tablet Take 1 tablet by mouth daily after breakfast.  .  methocarbamol (ROBAXIN) 500 MG tablet Take 1 tablet (500 mg total) by mouth every 8 (eight) hours as needed for muscle spasms.  . mirtazapine (REMERON) 15 MG tablet Take 15 mg by mouth at bedtime.  . nitroGLYCERIN (NITROSTAT) 0.4 MG SL tablet Place 0.4 mg under the tongue every 5 (five) minutes as needed for chest pain.   . pramipexole (MIRAPEX) 0.5 MG tablet Take 1 mg by mouth at bedtime.   Marland Kitchen PROAIR RESPICLICK 983 (90 Base) MCG/ACT AEPB Inhale 1 puff into the lungs every 6 (six) hours as needed (shortness of breath).   . ranitidine (ZANTAC) 150 MG tablet Take 150 mg by mouth at bedtime.  . trolamine salicylate (ASPERCREME) 10 % cream Apply 1 application topically as needed for muscle pain.  . famotidine (PEPCID) 20 MG tablet Take 20 mg by mouth 3 (three) times daily.  . insulin detemir (LEVEMIR) 100 UNIT/ML injection Inject 65 Units into the skin at bedtime.  . [DISCONTINUED] aspirin-acetaminophen-caffeine (EXCEDRIN MIGRAINE) 250-250-65 MG tablet Take 2 tablets by mouth every 6 (six) hours as needed for headache.  . [DISCONTINUED] CINNAMON PO Take 2,000 mg by mouth daily.  . [DISCONTINUED] Oxycodone HCl 10 MG TABS Take 1-2 tablets (10-20 mg total) by mouth every 6 (six) hours as needed (pain). (Patient not taking: Reported on 04/19/2017)   Facility-Administered Encounter Medications as of 04/19/2017  Medication  . triamcinolone acetonide (KENALOG) 10 MG/ML injection 10 mg     Review of Systems  Constitutional:   No  weight loss, night sweats,  Fevers, chills,  +fatigue, or  lassitude.  HEENT:   No headaches,  Difficulty swallowing,  Tooth/dental problems, or  Sore throat,                No sneezing, itching, ear ache,  +nasal congestion, post nasal drip,   CV:  No chest pain,  Orthopnea, PND, swelling in lower extremities, anasarca, dizziness, palpitations, syncope.   GI  No heartburn, indigestion, abdominal pain, nausea, vomiting, diarrhea, change in bowel habits, loss of appetite,  bloody stools.   Resp:    No chest wall deformity  Skin: no rash or lesions.  GU: no dysuria, change in color of urine, no urgency or frequency.  No flank pain, no hematuria   MS:  No joint pain or swelling.  No decreased range of motion.  No back pain.    Physical Exam  BP 122/80 (BP Location: Left Arm, Cuff Size: Large)   Pulse 75   Ht 5\' 2"  (1.575 m)   Wt 206  lb 9.6 oz (93.7 kg)   SpO2 97%   BMI 37.79 kg/m   GEN: A/Ox3; pleasant , NAD, obese    HEENT:  [place]/AT,  EACs-clear, TMs-wnl, NOSE-clear, THROAT-clear, no lesions, no postnasal drip or exudate noted.   NECK:  Supple w/ fair ROM; no JVD; normal carotid impulses w/o bruits; no thyromegaly or nodules palpated; no lymphadenopathy.    RESP  Clear  P & A; w/o, wheezes/ rales/ or rhonchi. no accessory muscle use, no dullness to percussion  CARD:  RRR, no m/r/g, no peripheral edema, pulses intact, no cyanosis or clubbing.  GI:   Soft & nt; nml bowel sounds; no organomegaly or masses detected.   Musco: Warm bil, no deformities or joint swelling noted.   Neuro: alert, no focal deficits noted.    Skin: Warm, no lesions or rashes tatoos    Lab Results:  CBC    Component Value Date/Time   WBC 5.7 10/25/2016 0935   RBC 3.59 (L) 10/25/2016 0935   HGB 10.6 (L) 10/25/2016 0935   HCT 32.8 (L) 10/25/2016 0935   PLT 323 10/25/2016 0935   MCV 91.4 10/25/2016 0935   MCH 29.5 10/25/2016 0935   MCHC 32.3 10/25/2016 0935   RDW 13.9 10/25/2016 0935   LYMPHSABS 2.5 08/21/2016 1058   MONOABS 0.5 08/21/2016 1058   EOSABS 0.5 08/21/2016 1058   BASOSABS 0.1 08/21/2016 1058    BMET    Component Value Date/Time   NA 137 10/25/2016 0935   K 4.6 10/25/2016 0935   CL 105 10/25/2016 0935   CO2 22 10/25/2016 0935   GLUCOSE 140 (H) 10/25/2016 0935   BUN 30 (H) 10/25/2016 0935   CREATININE 1.07 (H) 10/25/2016 0935   CALCIUM 9.5 10/25/2016 0935   GFRNONAA 56 (L) 10/25/2016 0935   GFRAA >60 10/25/2016 0935    BNP No results  found for: BNP  ProBNP No results found for: PROBNP  Imaging: No results found.   Assessment & Plan:   Cough Cough - ? ACE related with AR triggers   Plan  Patient Instructions  Discuss with your primary doctor that lisinopril pain, need making your cough worse. May use Mucinex DM twice daily as needed for cough and congestion Zyrtec 10 mg at bedtime as needed for drainage Saline nasal spray as needed. Lab tests today Activity as tolerated. Follow with Dr. [name] in 3-4 months and As needed   Please contact office for sooner follow up if symptoms do not improve or worsen or seek emergency care      DOE (dyspnea on exertion) DOE ? Etiology , felt to be component of deconditioning  Workup has been unrevealing with neg VQ scan , PFT and CXR , reported neg card w/up  Would cont exrecise regimen as tolerate  Plan  Patient Instructions  Discuss with your primary doctor that lisinopril pain, need making your cough worse. May use Mucinex DM twice daily as needed for cough and congestion Zyrtec 10 mg at bedtime as needed for drainage Saline nasal spray as needed. Lab tests today Activity as tolerated. Follow with Dr. [name] in 3-4 months and As needed   Please contact office for sooner follow up if symptoms do not improve or worsen or seek emergency care         Rexene Edison, NP 04/19/2017

## 2017-04-19 NOTE — Assessment & Plan Note (Signed)
Cough - ? ACE related with AR triggers   Plan  Patient Instructions  Discuss with your primary doctor that lisinopril pain, need making your cough worse. May use Mucinex DM twice daily as needed for cough and congestion Zyrtec 10 mg at bedtime as needed for drainage Saline nasal spray as needed. Lab tests today Activity as tolerated. Follow with Dr. [name] in 3-4 months and As needed   Please contact office for sooner follow up if symptoms do not improve or worsen or seek emergency care

## 2017-04-19 NOTE — Patient Instructions (Signed)
Discuss with your primary doctor that lisinopril pain, need making your cough worse. May use Mucinex DM twice daily as needed for cough and congestion Zyrtec 10 mg at bedtime as needed for drainage Saline nasal spray as needed. Lab tests today Activity as tolerated. Follow with Dr. [name] in 3-4 months and As needed   Please contact office for sooner follow up if symptoms do not improve or worsen or seek emergency care

## 2017-04-19 NOTE — Progress Notes (Signed)
I have reviewed and agree with assessment/plan.  Katelyn Bollier, MD [facility] 04/19/2017, 8:41 PM Pager:  [phone]  

## 2017-04-25 ENCOUNTER — Telehealth: Payer: Self-pay | Admitting: Pulmonary Disease

## 2017-04-25 LAB — ALPHA-1 ANTITRYPSIN PHENOTYPE: A-1 Antitrypsin: 122 mg/dL (ref 83–199)

## 2017-04-25 NOTE — Telephone Encounter (Signed)
Alpha 1 AT 04/19/17 > 122, MM   Please let her know lab test was normal and didn't show inherited form of emphysema.

## 2017-04-25 NOTE — Telephone Encounter (Signed)
atc pt X2, line rang to fast busy signal.  Wcb.  

## 2017-04-25 NOTE — Telephone Encounter (Signed)
Spoke with pt, who is requesting lab results from 04/19/17.  VS please advise. Thanks.

## 2017-04-26 NOTE — Telephone Encounter (Signed)
Pt is aware of results and voiced her understanding. Nothing further needed.  

## 2017-04-26 NOTE — Telephone Encounter (Signed)
lmtcb x1 for pt. 

## 2017-04-26 NOTE — Telephone Encounter (Signed)
Patient returning call - she can be reached at [phone] -pr

## 2017-05-07 ENCOUNTER — Telehealth: Payer: Self-pay | Admitting: Pulmonary Disease

## 2017-05-07 NOTE — Progress Notes (Signed)
LMOMTCB x 1 

## 2017-05-07 NOTE — Telephone Encounter (Signed)
Spoke with pt and informed of Alpha 1 lab results per [name].  Pt verbalized understanding.  Nothing further needed.

## 2017-05-29 DIAGNOSIS — M109 Gout, unspecified: Secondary | ICD-10-CM | POA: Diagnosis not present

## 2017-05-29 DIAGNOSIS — N183 Chronic kidney disease, stage 3 (moderate): Secondary | ICD-10-CM | POA: Diagnosis not present

## 2017-05-29 DIAGNOSIS — Z6837 Body mass index (BMI) 37.0-37.9, adult: Secondary | ICD-10-CM | POA: Diagnosis not present

## 2017-05-29 DIAGNOSIS — E784 Other hyperlipidemia: Secondary | ICD-10-CM | POA: Diagnosis not present

## 2017-05-29 DIAGNOSIS — E1142 Type 2 diabetes mellitus with diabetic polyneuropathy: Secondary | ICD-10-CM | POA: Diagnosis not present

## 2017-05-29 DIAGNOSIS — R740 Nonspecific elevation of levels of transaminase and lactic acid dehydrogenase [LDH]: Secondary | ICD-10-CM | POA: Diagnosis not present

## 2017-05-29 DIAGNOSIS — I2510 Atherosclerotic heart disease of native coronary artery without angina pectoris: Secondary | ICD-10-CM | POA: Diagnosis not present

## 2017-05-29 DIAGNOSIS — K222 Esophageal obstruction: Secondary | ICD-10-CM | POA: Diagnosis not present

## 2017-05-29 DIAGNOSIS — I10 Essential (primary) hypertension: Secondary | ICD-10-CM | POA: Diagnosis not present

## 2017-05-29 DIAGNOSIS — E1140 Type 2 diabetes mellitus with diabetic neuropathy, unspecified: Secondary | ICD-10-CM | POA: Diagnosis not present

## 2017-05-29 DIAGNOSIS — F3289 Other specified depressive episodes: Secondary | ICD-10-CM | POA: Diagnosis not present

## 2017-06-05 DIAGNOSIS — I10 Essential (primary) hypertension: Secondary | ICD-10-CM | POA: Diagnosis not present

## 2017-06-05 DIAGNOSIS — Z6836 Body mass index (BMI) 36.0-36.9, adult: Secondary | ICD-10-CM | POA: Diagnosis not present

## 2017-06-05 DIAGNOSIS — M545 Low back pain: Secondary | ICD-10-CM | POA: Diagnosis not present

## 2017-06-14 DIAGNOSIS — F3340 Major depressive disorder, recurrent, in remission, unspecified: Secondary | ICD-10-CM | POA: Diagnosis not present

## 2017-06-14 DIAGNOSIS — G2581 Restless legs syndrome: Secondary | ICD-10-CM | POA: Diagnosis not present

## 2017-06-14 DIAGNOSIS — I10 Essential (primary) hypertension: Secondary | ICD-10-CM | POA: Diagnosis not present

## 2017-07-12 DIAGNOSIS — Z794 Long term (current) use of insulin: Secondary | ICD-10-CM | POA: Diagnosis not present

## 2017-07-12 DIAGNOSIS — N183 Chronic kidney disease, stage 3 (moderate): Secondary | ICD-10-CM | POA: Diagnosis not present

## 2017-07-12 DIAGNOSIS — E1140 Type 2 diabetes mellitus with diabetic neuropathy, unspecified: Secondary | ICD-10-CM | POA: Diagnosis not present

## 2017-07-12 DIAGNOSIS — I10 Essential (primary) hypertension: Secondary | ICD-10-CM | POA: Diagnosis not present

## 2017-07-17 ENCOUNTER — Other Ambulatory Visit: Payer: Self-pay

## 2017-07-17 MED ORDER — FUROSEMIDE 20 MG PO TABS: 20.0000 mg | ORAL_TABLET | Freq: Two times a day (BID) | ORAL | 3 refills | Status: DC

## 2017-07-20 ENCOUNTER — Encounter: Payer: Self-pay | Admitting: Pulmonary Disease

## 2017-07-20 ENCOUNTER — Ambulatory Visit (INDEPENDENT_AMBULATORY_CARE_PROVIDER_SITE_OTHER): Payer: PPO | Admitting: Pulmonary Disease

## 2017-07-20 VITALS — BP 160/88 | HR 111 | Ht 62.0 in | Wt 209.4 lb

## 2017-07-20 DIAGNOSIS — R0609 Other forms of dyspnea: Secondary | ICD-10-CM | POA: Diagnosis not present

## 2017-07-20 DIAGNOSIS — R252 Cramp and spasm: Secondary | ICD-10-CM | POA: Diagnosis not present

## 2017-07-20 DIAGNOSIS — R29818 Other symptoms and signs involving the nervous system: Secondary | ICD-10-CM | POA: Diagnosis not present

## 2017-07-20 NOTE — Patient Instructions (Signed)
Will arrange for home sleep study Will call to arrange for follow up after sleep study reviewed  

## 2017-07-20 NOTE — Progress Notes (Signed)
Current Outpatient Prescriptions on File Prior to Visit  Medication Sig  . aspirin 81 MG tablet Take 81 mg by mouth at bedtime.   Marland Kitchen doxycycline (ADOXA) 100 MG tablet Take 100 mg by mouth every 12 (twelve) hours.  . DULoxetine (CYMBALTA) 60 MG capsule Take 60 mg by mouth at bedtime.   . famotidine (PEPCID) 20 MG tablet Take 20 mg by mouth 3 (three) times daily.  . furosemide (LASIX) 20 MG tablet Take 1 tablet (20 mg total) by mouth 2 (two) times daily.  Marland Kitchen gabapentin (NEURONTIN) 100 MG capsule Take 100 mg by mouth 2 (two) times daily.   . insulin detemir (LEVEMIR) 100 UNIT/ML injection Inject 65 Units into the skin at bedtime.  . methocarbamol (ROBAXIN) 500 MG tablet Take 1 tablet (500 mg total) by mouth every 8 (eight) hours as needed for muscle spasms.  . mirtazapine (REMERON) 15 MG tablet Take 15 mg by mouth at bedtime.  . nitroGLYCERIN (NITROSTAT) 0.4 MG SL tablet Place 0.4 mg under the tongue every 5 (five) minutes as needed for chest pain.   . pramipexole (MIRAPEX) 0.5 MG tablet Take 1 mg by mouth at bedtime.   Marland Kitchen PROAIR RESPICLICK 027 (90 Base) MCG/ACT AEPB Inhale 1 puff into the lungs every 6 (six) hours as needed (shortness of breath).   . ranitidine (ZANTAC) 150 MG tablet Take 150 mg by mouth at bedtime.  . trolamine salicylate (ASPERCREME) 10 % cream Apply 1 application topically as needed for muscle pain.   Current Facility-Administered Medications on File Prior to Visit  Medication  . triamcinolone acetonide (KENALOG) 10 MG/ML injection 10 mg    Chief Complaint  Patient presents with  . Follow-up    Pt is having SOB w/exertion more often since last visit. Pt is having swelling in both legs, and legs hurt badly when walking; worsened since last visit.     Pulmonary tests V/Q scan 03/07/16 >> low probability for PE PFT 04/04/16 >> FEV1 1.77 (74%), FEV1% 87, TLC 3.69 (77%), DLCO 69%, no BD CT angio chest 01/08/17 >> no PE A1AT 04/19/17 >> 122, MM  Cardiac tests Doppler legs  b/l 03/07/16 >> no DVT Echo EF 01/17/17 >> 55 to 60%, mild LVH  Past medical history Anemia, Anxiety, CAD, CKD, Depression, HTN, GERD, HH, Nephrolithiasis, HLD, LBBB, lumbar spinal stenosis, neuropathy, RLS, DM , OSA  Vital signs BP (!) 160/88 (BP Location: Left Arm, Cuff Size: Normal)   Pulse (!) 111   Ht 5\' 2"  (1.575 m)   Wt 209 lb 6.4 oz (95 kg)   SpO2 95%   BMI 38.30 kg/m   History of present illness Katelyn [name] is a 61 y.o. female with progressive dyspnea.  Since her last visit with me she had CT chest and Echo.  These were unrevealing.  She had f/u with Dr. Bettina [name] with cardiology also.  She has joined the gym.  She rides on a recumbent bike for about 5 miles.  She is slowly building up her stamina.  She has been getting pain in the anterior parts of her lower legs when exercising.    She snores, and her husband says her sleep is terrible.  She will stop breathing and wake up with a cough.  She had a sleep study several years ago.  She was told she had mild sleep apnea, and never started on therapy.  She feels tired during the day.  She hasn't been using albuterol.  She wasn't sure when to use  this.    Physical exam  General - pleasant Eyes - pupils reactive ENT - no sinus tenderness, no oral exudate, no LAN, MP 3 Cardiac - regular, no murmur Chest - no wheeze, rales Abd - soft, non tender Ext - no edema Skin - no rashes Neuro - normal strength Psych - normal mood   Assessment/plan  Dyspnea on exertion. - likely from obesity and deconditioning - encouraged her to keep up with her exercise regimen - don't think a CPST is warranted at this time since it likely would not provide any additional information  Suspected sleep apnea. - will arrange for home sleep study to further assess  Medial tibial stress syndrome. - advised her to d/w cardiology about whether she has PAD - if no PAD, then she needs to strengthen her anterior tibial muscles >> discussed how to  accomplish this  Hypertension. - advised her to f/u with PCP and cardiology   Patient Instructions  Will arrange for home sleep study Will call to arrange for follow up after sleep study reviewed    Chesley Mires, MD [facility] Pager:  [phone] 07/20/2017, 2:33 PM

## 2017-07-23 ENCOUNTER — Encounter: Payer: Self-pay | Admitting: *Deleted

## 2017-07-26 DIAGNOSIS — Z808 Family history of malignant neoplasm of other organs or systems: Secondary | ICD-10-CM | POA: Diagnosis not present

## 2017-07-26 DIAGNOSIS — Z8041 Family history of malignant neoplasm of ovary: Secondary | ICD-10-CM | POA: Diagnosis not present

## 2017-07-26 DIAGNOSIS — Z803 Family history of malignant neoplasm of breast: Secondary | ICD-10-CM | POA: Diagnosis not present

## 2017-07-26 DIAGNOSIS — Z801 Family history of malignant neoplasm of trachea, bronchus and lung: Secondary | ICD-10-CM | POA: Diagnosis not present

## 2017-07-31 ENCOUNTER — Other Ambulatory Visit: Payer: Self-pay | Admitting: *Deleted

## 2017-08-01 ENCOUNTER — Other Ambulatory Visit: Payer: Self-pay | Admitting: *Deleted

## 2017-08-03 NOTE — Progress Notes (Signed)
Cardiology Office Note:    Date:  08/06/2017   ID:  Katelyn [name], DOB October 09, 1956, MRN [id number]  PCP:  Katelyn Greenhouse, MD  Cardiologist:  Katelyn More, MD    Referring MD: Katelyn Greenhouse, MD    ASSESSMENT:    1. Hypertensive heart disease with heart failure ([place])   2. Mild CAD   3. LBBB (left bundle branch block)   4. Essential hypertension   5. Hyperlipidemia, unspecified hyperlipidemia type    PLAN:    In order of problems listed above:  1. Worsened we will switch to [name] potent dependable loop diuretic follow-up BMP BMP 1 week 2. Stable, at this time I would not advise an ischemia evaluation. 3. Stable her ejection fraction is normal 4. Blood pressure controlled continue current therapy including diuretic and beta-blocker 5. Poorly controlled statin intolerant consider PCSK9 at next visit   Next appointment: 2 weeks with decompensated heart failure   Medication Adjustments/Labs and Tests Ordered: Current medicines are reviewed at length with the patient today.  Concerns regarding medicines are outlined above.  Orders Placed This Encounter  Procedures  . Basic metabolic panel  . B Nat Peptide   Meds ordered this encounter  Medications  . torsemide (DEMADEX) 20 MG tablet    Sig: Take 1 tablet (20 mg total) by mouth daily.    Dispense:  60 tablet    Refill:  3    Chief Complaint  Patient presents with  . Follow-up    for LBBB  . Coronary Artery Disease    History of Present Illness:    Katelyn [name] is a 61 y.o. female with a hx of mild CAD, hypertesnion  Dyslipidemia, LBBB EF 55-60%, restrictive lung disease and SOB due to deconditioning and obesity last seen 6 months ago. Compliance with diet, lifestyle and medications: Yes She is not pleased with the quality of her life her weight is up approximately 14 pounds she is short of breath with any activity outside the home or [name] than light housework but no orthopnea chest pain palpitation or syncope, she  does have increased edema.  She takes gabapentin which tends to favor sodium retention.  Loop diuretic was increased no effect Past Medical History:  Diagnosis Date  . Anemia    previous transfusion 2/17  . Angina pectoris ([place]) 11/24/2015  . Anxiety   . Arthritis   . Benign hypertension 03/14/2016  . Bursitis of right hip   . CAD (coronary artery disease)    Cardiac catheterization June 2014 in [facility] - 50% circumflex stenosis  . CAD in native artery 11/24/2015  . Cannot sleep 12/07/2015  . Chronic kidney disease    does not see nephrologist  . Chronic low back pain without sciatica 03/14/2016  . Complication of anesthesia   . Cough 04/19/2017   Overview:  Last Assessment & Plan:  Formatting of this note may be different from the original. Cough - ? ACE related with AR triggers   Plan  Patient Instructions  Discuss with your primary doctor that lisinopril pain, need making your cough worse. May use Mucinex DM twice daily as needed for cough and congestion Zyrtec 10 mg at bedtime as needed for drainage Saline nasal spray as needed. Lab tests today Activity as tolerated. Follow with Dr. [name] in 3-4 months and As needed   Please contact office for sooner follow up if symptoms do not improve or worsen or seek emergency care   . Depression   .  DOE (dyspnea on exertion) 04/19/2017   Overview:  2017:   Last Assessment & Plan:  Formatting of this note may be different from the original. DOE ? Etiology , felt to be component of deconditioning  Workup has been unrevealing with neg VQ scan , PFT and CXR , reported neg card w/up  Would cont exrecise regimen as tolerate  Plan  Patient Instructions  Discuss with your primary doctor that lisinopril pain, need making your cough worse. May use Mucinex DM twice daily as needed for cough and congestion Zyrtec 10 mg at bedtime as needed for drainage Saline nasal spray as needed. Lab tests today Activity as tolerated. Follow with Dr. [name] in 3-4 months and As needed    Please contact office for sooner follow up if symptoms do not improve or worsen or seek emergency care   . Dyspnea    with exertion   " lazy lung" - per  [name] from back issues- 06/2016  . Elevated liver enzymes 12/05/2016  . Essential hypertension   . GERD (gastroesophageal reflux disease)   . Gout 03/14/2016  . Greater trochanteric bursitis of right hip 02/02/2012  . H/O hiatal hernia   . Heart murmur   . History of blood transfusion 2016  . History of kidney stones   . Hypercholesterolemia   . Hyperlipemia 01/01/2017  . Iliotibial band syndrome of right side 02/02/2012  . Iron deficiency anemia due to chronic blood loss 03/14/2016  . LBBB (left bundle branch block) 01/01/2017  . Left bundle branch block   . Lumbar stenosis   . Meralgia paraesthetica 12/05/2016  . Neuropathy   . OSA (obstructive sleep apnea) 05/24/2016   Overview:  Managed PULM  . PONV (postoperative nausea and vomiting)    "no N/V with patch"  . Postprocedural state 11/26/2015  . 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[place]) 12/07/2015  . Restless leg 12/07/2015  . Restless leg syndrome   . 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   Overview:  2017: dx  . S/P lumbar laminectomy 11/26/2015  . S/P lumbar spinal fusion 08/29/2016  . Sleep apnea    mild case  no cpap  . Tinnitus 12/05/2016  . Type 2 diabetes mellitus ([place])   . Type 2 diabetes mellitus without complication, with long-term current use of insulin ([place]) 03/14/2016   Overview:  Managed ENDO    Past Surgical History:  Procedure Laterality Date  . ABDOMINAL HYSTERECTOMY  1983  . APPENDECTOMY  Age 63  . BACK SURGERY     FIRST LUMBAR FUSION/ SURGERY APRIL 2012 AND FUSION WITH INSTRUMENTATION SEPT 2012  . CARDIAC CATHETERIZATION     x 2  . CARPAL TUNNEL RELEASE     bil  . CHOLECYSTECTOMY  1990's  . CYSTO EXTRACTION KIDNEY STONES    . EXCISION/RELEASE BURSA HIP  02/02/2012   Procedure: EXCISION/RELEASE BURSA HIP;  Surgeon: Tobi Bastos, MD;  Location: [facility];   Service: Orthopedics;  Laterality: Right;  Right Hip Bursectomy  . EYE SURGERY Bilateral    cataracts  . KIDNEY STONE SURGERY  2008  . Left knee surgery x 2  1996   reconstruction  . LUMBAR DISC SURGERY  08/2016  . LUMBAR LAMINECTOMY/DECOMPRESSION MICRODISCECTOMY Right 11/26/2015   Procedure: Extraforaminal Microdiscectomy  - Lumbar two-three- right;  Surgeon: Eustace Moore, MD;  Location: [facility];  Service: Neurosurgery;  Laterality: Right;  right   . LUMBAR WOUND DEBRIDEMENT N/A 10/25/2016   Procedure: Lumbar wound revision;  Surgeon: Shanon Brow  Adah Salvage, MD;  Location: [facility];  Service: Neurosurgery;  Laterality: N/A;  Lumbar wound revision  . Right shoulder surgery  2010   spur    Current Medications: Current Meds  Medication Sig  . allopurinol (ZYLOPRIM) 100 MG tablet daily.  Marland Kitchen aspirin 81 MG tablet Take 81 mg by mouth at bedtime.   . carvedilol (COREG) 6.25 MG tablet 2 (two) times daily.  . Continuous Blood Gluc Sensor ([place]) MISC   . DULoxetine (CYMBALTA) 60 MG capsule Take 60 mg by mouth at bedtime.   . gabapentin (NEURONTIN) 300 MG capsule at bedtime.  . insulin aspart (NOVOLOG FLEXPEN) 100 UNIT/ML FlexPen 3 (three) times daily.  . insulin NPH-regular Human (NOVOLIN 70/30) (70-30) 100 UNIT/ML injection Inject 35 Units into the skin 2 (two) times daily with a meal.  . mirtazapine (REMERON) 15 MG tablet Take 15 mg by mouth at bedtime.  . nitroGLYCERIN (NITROSTAT) 0.4 MG SL tablet Place 0.4 mg under the tongue every 5 (five) minutes as needed for chest pain.   Glory Rosebush VERIO test strip   . pramipexole (MIRAPEX) 0.5 MG tablet Take 1 mg by mouth at bedtime.   Marland Kitchen PROAIR RESPICLICK 354 (90 Base) MCG/ACT AEPB Inhale 1 puff into the lungs every 6 (six) hours as needed (shortness of breath).   Tyler Aas FLEXTOUCH 200 UNIT/ML SOPN 100 Units at bedtime.   . trolamine salicylate (ASPERCREME) 10 % cream Apply 1 application topically as needed for muscle pain.  .  [DISCONTINUED] furosemide (LASIX) 20 MG tablet Take 1 tablet (20 mg total) by mouth 2 (two) times daily.  . [DISCONTINUED] gabapentin (NEURONTIN) 100 MG capsule Take 100 mg by mouth 2 (two) times daily.    Current Facility-Administered Medications for the 08/06/17 encounter (Office Visit) with Richardo Priest, MD  Medication  . triamcinolone acetonide (KENALOG) 10 MG/ML injection 10 mg     Allergies:   Atorvastatin; Talwin [pentazocine]; and Other   Social History   Social History  . Marital status: Married    Spouse name: N/A  . Number of children: N/A  . 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 Smoking status: Never Smoker  . Smokeless tobacco: Never Used  . Alcohol use 0.0 oz/week     Comment: Rarely  . Drug use: No  . Sexual activity: Not Asked   Other Topics Concern  . None   Social History Narrative  . None     Family History: The patient's family history includes Asthma in her sister; Bone cancer in her sister; Heart failure in her mother; Hypertension in her father; Lung cancer in her father; Stroke in her father. ROS:   Please see the history of present illness.    All other systems reviewed and are negative.  EKGs/Labs/Other Studies Reviewed:    The following studies were reviewed today: Pulmonary tests V/Q scan 03/07/16 >> low probability for PE PFT 04/04/16 >> FEV1 1.77 (74%), FEV1% 87, TLC 3.69 (77%), DLCO 69%, no BD CT angio chest 01/08/17 >> no PE A1AT 04/19/17 >> 122, MM  Cardiac tests Doppler legs b/l 03/07/16 >> no DVT Echo EF 01/17/17 >> 55 to 60%, mild LVH   Recent Labs: 10/25/2016: BUN 30; Creatinine, Ser 1.07; Hemoglobin 10.6; Platelets 323; Potassium 4.6; Sodium 137  Recent Lipid Panel No results found for: CHOL, TRIG, HDL, CHOLHDL, VLDL, LDLCALC, LDLDIRECT  Physical Exam:  VS:  BP 110/72 (BP Location: Right Arm, Patient Position: Sitting, Cuff Size: Normal)   Pulse  80   Ht 5\' 2"  (1.575 m)   Wt 214 lb (97.1 kg)   SpO2 94%   BMI 39.14 kg/m     Wt Readings from Last 3 Encounters:  08/06/17 214 lb (97.1 kg)  07/20/17 209 lb 6.4 oz (95 kg)  04/19/17 206 lb 9.6 oz (93.7 kg)     GEN:  Well nourished, well developed in no acute distress HEENT: Normal NECK: No JVD; No carotid bruits LYMPHATICS: No lymphadenopathy CARDIAC: RRR, no murmurs, rubs, gallops RESPIRATORY:  Clear to auscultation without rales, wheezing or rhonchi  ABDOMEN: Soft, non-tender, non-distended MUSCULOSKELETAL: 2-3+ bilateral to the knee edema; No deformity  SKIN: Warm and dry NEUROLOGIC:  Alert and oriented x 3 PSYCHIATRIC:  Normal affect    Signed, Katelyn More, MD  08/06/2017 1:07 PM    [facility]

## 2017-08-06 ENCOUNTER — Ambulatory Visit (INDEPENDENT_AMBULATORY_CARE_PROVIDER_SITE_OTHER): Payer: PPO | Admitting: Cardiology

## 2017-08-06 ENCOUNTER — Encounter: Payer: Self-pay | Admitting: Cardiology

## 2017-08-06 VITALS — BP 110/72 | HR 80 | Ht 62.0 in | Wt 214.0 lb

## 2017-08-06 DIAGNOSIS — I10 Essential (primary) hypertension: Secondary | ICD-10-CM

## 2017-08-06 DIAGNOSIS — I110 Hypertensive heart disease with heart failure: Secondary | ICD-10-CM | POA: Diagnosis not present

## 2017-08-06 DIAGNOSIS — I2510 Atherosclerotic heart disease of native coronary artery without angina pectoris: Secondary | ICD-10-CM | POA: Diagnosis not present

## 2017-08-06 DIAGNOSIS — E785 Hyperlipidemia, unspecified: Secondary | ICD-10-CM

## 2017-08-06 DIAGNOSIS — I447 Left bundle-branch block, unspecified: Secondary | ICD-10-CM | POA: Diagnosis not present

## 2017-08-06 MED ORDER — TORSEMIDE 20 MG PO TABS: 20.0000 mg | ORAL_TABLET | Freq: Every day | ORAL | 3 refills | Status: DC

## 2017-08-06 NOTE — Patient Instructions (Addendum)
Medication Instructions:  Your physician has recommended you make the following change in your medication:  STOP furosemide START torsemide 20 mg twice daily  Labwork: Your physician recommends that you return for lab work in: 1 week at Dr. Arna Medici office. BMP, BNP  Testing/Procedures: None  Follow-Up: Your physician recommends that you schedule a follow-up appointment in: 2 weeks.  Any Other Special Instructions Will Be Listed Below (If Applicable).     If you need a refill on your cardiac medications before your next appointment, please call your pharmacy.    Heart Failure  Weigh yourself every morning when you first wake up and record on a calender or note pad, bring this to your office visits. Using a pill tender can help with taking your medications consistently.  Limit your fluid intake to 2 liters daily  Limit your sodium intake to less than 2-3 grams daily. Ask if you need dietary teaching.  If you gain more than 3 pounds (from your dry weight ), double your dose of diuretic for the day.  If you gain more than 5 pounds (from your dry weight), double your dose of lasix and call your heart failure doctor.  Please do not smoke tobacco since it is very bad for your heart.  Please do not drink alcohol since it can worsen your heart failure.Also avoid OTC nonsteroidal drugs, such as advil, aleve and motrin.  Try to exercise for at least 30 minutes every day because this will help your heart be more efficient. You may be eligible for supervised cardiac rehab, ask your physician.

## 2017-08-07 ENCOUNTER — Other Ambulatory Visit: Payer: Self-pay

## 2017-08-07 DIAGNOSIS — I447 Left bundle-branch block, unspecified: Secondary | ICD-10-CM

## 2017-08-07 DIAGNOSIS — I10 Essential (primary) hypertension: Secondary | ICD-10-CM

## 2017-08-07 MED ORDER — TORSEMIDE 20 MG PO TABS: 20.0000 mg | ORAL_TABLET | Freq: Two times a day (BID) | ORAL | 3 refills | Status: DC

## 2017-08-08 DIAGNOSIS — I10 Essential (primary) hypertension: Secondary | ICD-10-CM | POA: Diagnosis not present

## 2017-08-08 DIAGNOSIS — R0609 Other forms of dyspnea: Secondary | ICD-10-CM | POA: Diagnosis not present

## 2017-08-08 DIAGNOSIS — I25119 Atherosclerotic heart disease of native coronary artery with unspecified angina pectoris: Secondary | ICD-10-CM | POA: Diagnosis not present

## 2017-08-08 DIAGNOSIS — E782 Mixed hyperlipidemia: Secondary | ICD-10-CM | POA: Diagnosis not present

## 2017-08-08 DIAGNOSIS — I447 Left bundle-branch block, unspecified: Secondary | ICD-10-CM | POA: Diagnosis not present

## 2017-08-13 ENCOUNTER — Telehealth: Payer: Self-pay

## 2017-08-13 NOTE — Telephone Encounter (Signed)
Patient informed of normal lab results from Dr. Arna Medici office. Patient verbalized understanding.

## 2017-08-15 DIAGNOSIS — J042 Acute laryngotracheitis: Secondary | ICD-10-CM | POA: Diagnosis not present

## 2017-08-17 ENCOUNTER — Ambulatory Visit: Payer: Self-pay | Admitting: Genetic Counselor

## 2017-08-17 ENCOUNTER — Encounter: Payer: Self-pay | Admitting: Genetic Counselor

## 2017-08-17 ENCOUNTER — Telehealth: Payer: Self-pay | Admitting: Genetic Counselor

## 2017-08-17 DIAGNOSIS — Z1379 Encounter for other screening for genetic and chromosomal anomalies: Secondary | ICD-10-CM

## 2017-08-17 DIAGNOSIS — Z8041 Family history of malignant neoplasm of ovary: Secondary | ICD-10-CM | POA: Insufficient documentation

## 2017-08-17 DIAGNOSIS — Z803 Family history of malignant neoplasm of breast: Secondary | ICD-10-CM | POA: Insufficient documentation

## 2017-08-17 NOTE — Progress Notes (Signed)
HPI: Ms. Carrero was previously seen in the [facility] clinic due to a family history of cancer and concerns regarding a hereditary predisposition to cancer. Please refer to our prior cancer genetics clinic note for more information regarding Ms. Cyr's medical, social and family histories, and our assessment and recommendations, at the time. Ms. Mccampbell's recent genetic test results were disclosed to her, as were recommendations warranted by these results. These results and recommendations are discussed in more detail below.  CANCER HISTORY:   No history exists.    FAMILY HISTORY:  We obtained a detailed, 4-generation family history.  Significant diagnoses are listed below: Family History  Problem Relation Age of Onset  . Lung cancer Father        smoked  . Hypertension Father   . Stroke Father   . Heart failure Mother   . Bone cancer Sister   . Asthma Sister    The patient had one son who died in a car accident.  She has four sisters.  One sister died of ovarian cancer at age 23, another sister has developmental delay, a pancreatic cyst and non-alcoholic liver cirrhosis.  The patient's parents are both deceased.  The mother died at 8 from heart disease.  She had four brothers and three sisters, none had cancer.  The grandfather died of stomach cancer and the grandmother from old age.  The patients' father had skin cancer and lung cancer.  He had two sisters, one had breast cancer.  Both paternal grandparents are deceased.  Patient's maternal ancestors are of Caucasian descent, and paternal ancestors are of Caucasian descent. There is no reported Ashkenazi Jewish ancestry. There is no known consanguinity.  GENETIC TEST RESULTS: Genetic testing reported out on August 17, 2017 through the multigene cancer panel found no deleterious mutations.  The Multi-Gene Panel offered by [facility] includes sequencing and/or deletion duplication testing of the following 80 genes: ALK, APC, ATM,  AXIN2,BAP1,  BARD1, BLM, BMPR1A, BRCA1, BRCA2, BRIP1, CASR, CDC73, CDH1, CDK4, CDKN1B, CDKN1C, CDKN2A (p14ARF), CDKN2A (p16INK4a), CEBPA, CHEK2, CTNNA1, DICER1, DIS3L2, EGFR (c.2369C>T, p.Thr790Met variant only), EPCAM (Deletion/duplication testing only), FH, FLCN, GATA2, GPC3, GREM1 (Promoter region deletion/duplication testing only), HOXB13 (c.251G>A, p.Gly84Glu), HRAS, KIT, MAX, MEN1, MET, MITF (c.952G>A, p.Glu318Lys variant only), MLH1, MSH2, MSH3, MSH6, MUTYH, NBN, NF1, NF2, NTHL1, PALB2, PDGFRA, PHOX2B, PMS2, POLD1, POLE, POT1, PRKAR1A, PTCH1, PTEN, RAD50, RAD51C, RAD51D, RB1, RECQL4, RET, RUNX1, SDHAF2, SDHA (sequence changes only), SDHB, SDHC, SDHD, SMAD4, SMARCA4, SMARCB1, SMARCE1, STK11, SUFU, TERT, TERT, TMEM127, TP53, TSC1, TSC2, VHL, WRN and WT1.  The test report has been scanned into EPIC and is located under the Molecular Pathology section of the Results Review tab.   We discussed with Ms. Angelica that since the current genetic testing is not perfect, it is possible there may be a gene mutation in one of these genes that current testing cannot detect, but that chance is small. We also discussed, that it is possible that another gene that has not yet been discovered, or that we have not yet tested, is responsible for the cancer diagnoses in the family, and it is, therefore, important to remain in touch with cancer genetics in the future so that we can continue to offer Ms. Helling the most up to date genetic testing.   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 At this time, it is unknown if this  variant is associated with increased cancer risk or if this is a normal finding, but most variants such as this get reclassified to being inconsequential. It should not be used to make medical management decisions. With time, we suspect the lab will determine the significance of  this variant, if any. If we do learn more about it, we will try to contact Ms. Advincula to discuss it further. However, it is important to stay in touch with [place] periodically and keep the address and phone number up to date.   CANCER SCREENING RECOMMENDATIONS:  This normal result is reassuring and indicates that Ms. Lamagna does not likely have an increased risk of cancer due to a mutation in one of these genes.  We, therefore, recommended  Ms. Deroche continue to follow the cancer screening guidelines provided by her primary healthcare providers.   RECOMMENDATIONS FOR FAMILY MEMBERS: Women in this family might be at some increased risk of developing cancer, over the general population risk, simply due to the family history of cancer. We recommended women in this family have a yearly mammogram beginning at age 57, or 45 years younger than the earliest onset of cancer, an annual clinical breast exam, and perform monthly breast self-exams. Women in this family should also have a gynecological exam as recommended by their primary provider. All family members should have a colonoscopy by age 52.  FOLLOW-UP: Lastly, we discussed with Ms. Topping that cancer genetics is a rapidly advancing field and it is possible that new genetic tests will be appropriate for her and/or her family members in the future. We encouraged her to remain in contact with cancer genetics on an annual basis so we can update her personal and family histories and let her know of advances in cancer genetics that may benefit this family.   Our contact number was provided. Ms. Detwiler's questions were answered to her satisfaction, and she knows she is welcome to call us at anytime with additional questions or concerns.   Roma Kayser, MS, [facility] Certified Genetic Counselor [email or web address]

## 2017-08-17 NOTE — Telephone Encounter (Signed)
Revealed negative genetic testing.  Discussed that we do not know why there is cancer in the family. It could be due to a different gene that we are not testing, or maybe our current technology may not be able to pick something up.  It will be important for her to keep in contact with genetics to keep up with whether additional testing may be needed.  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  Discussed VUS and that we will recontact her when these are reclassified.

## 2017-08-20 ENCOUNTER — Ambulatory Visit: Payer: PPO | Admitting: Cardiology

## 2017-08-20 ENCOUNTER — Encounter: Payer: Self-pay | Admitting: Cardiology

## 2017-08-20 VITALS — BP 128/76 | HR 80 | Ht 62.0 in | Wt 211.0 lb

## 2017-08-20 DIAGNOSIS — I5032 Chronic diastolic (congestive) heart failure: Secondary | ICD-10-CM | POA: Insufficient documentation

## 2017-08-20 DIAGNOSIS — I110 Hypertensive heart disease with heart failure: Secondary | ICD-10-CM | POA: Diagnosis not present

## 2017-08-20 HISTORY — DX: Chronic diastolic (congestive) heart failure: I50.32

## 2017-08-20 NOTE — Progress Notes (Signed)
Cardiology Office Note:    Date:  08/20/2017   ID:  Katelyn [name], DOB May 26, 1956, MRN [id number]  PCP:  Algis Greenhouse, MD  Cardiologist:  Shirlee More, MD    Referring MD: Algis Greenhouse, MD    ASSESSMENT:    1. Hypertensive heart disease with heart failure ([place])   2. Chronic diastolic heart failure (HCC)    PLAN:    In order of problems listed above:  1. Stable continue treatment beta-blocker and loop diuretic improved  2. Stable, although her weight has not changed edema is resolved shortness of breath is improve and nd she will continue loop diuretic and sodium restriction   Next appointment: 3 months   Medication Adjustments/Labs and Tests Ordered: Current medicines are reviewed at length with the patient today.  Concerns regarding medicines are outlined above.  No orders of the defined types were placed in this encounter.  No orders of the defined types were placed in this encounter.   Chief Complaint  Patient presents with  . Follow-up  . Shortness of Breath  . Edema  . Congestive Heart Failure    History of Present Illness:    Katelyn [name] is a 61 y.o. female with a hx of mild CAD, hypertesnion  Dyslipidemia, LBBB EF 55-60%, restrictive lung disease and SOB due to deconditioning and obesity  last seen 2 weeks ago with decompensated heart failure.. Compliance with diet, lifestyle and medications: Yes weight has not significantly changed but edema has resolved shortness of breath is improved and presently has been treated for sinus infection.  Past Medical History:  Diagnosis Date  . Anemia    previous transfusion 2/17  . Angina pectoris ([place]) 11/24/2015  . Anxiety   . Arthritis   . Benign hypertension 03/14/2016  . Bursitis of right hip   . CAD (coronary artery disease)    Cardiac catheterization June 2014 in [facility] - 50% circumflex stenosis  . CAD in native artery 11/24/2015  . Cannot sleep 12/07/2015  . Chronic kidney disease    does not see  nephrologist  . Chronic low back pain without sciatica 03/14/2016  . Complication of anesthesia   . Cough 04/19/2017   Overview:  Last Assessment & Plan:  Formatting of this note may be different from the original. Cough - ? ACE related with AR triggers   Plan  Patient Instructions  Discuss with your primary doctor that lisinopril pain, need making your cough worse. May use Mucinex DM twice daily as needed for cough and congestion Zyrtec 10 mg at bedtime as needed for drainage Saline nasal spray as needed. Lab tests today Activity as tolerated. Follow with Dr. [name] in 3-4 months and As needed   Please contact office for sooner follow up if symptoms do not improve or worsen or seek emergency care   . Depression   . DOE (dyspnea on exertion) 04/19/2017   Overview:  2017:   Last Assessment & Plan:  Formatting of this note may be different from the original. DOE ? Etiology , felt to be component of deconditioning  Workup has been unrevealing with neg VQ scan , PFT and CXR , reported neg card w/up  Would cont exrecise regimen as tolerate  Plan  Patient Instructions  Discuss with your primary doctor that lisinopril pain, need making your cough worse. May use Mucinex DM twice daily as needed for cough and congestion Zyrtec 10 mg at bedtime as needed for drainage Saline nasal spray as  needed. Lab tests today Activity as tolerated. Follow with Dr. [name] in 3-4 months and As needed   Please contact office for sooner follow up if symptoms do not improve or worsen or seek emergency care   . Dyspnea    with exertion   " lazy lung" - per  [name] from back issues- 06/2016  . Elevated liver enzymes 12/05/2016  . Essential hypertension   . GERD (gastroesophageal reflux disease)   . Gout 03/14/2016  . Greater trochanteric bursitis of right hip 02/02/2012  . H/O hiatal hernia   . Heart murmur   . History of blood transfusion 2016  . History of kidney stones   . Hypercholesterolemia   . Hyperlipemia 01/01/2017  . Iliotibial  band syndrome of right side 02/02/2012  . Iron deficiency anemia due to chronic blood loss 03/14/2016  . LBBB (left bundle branch block) 01/01/2017  . Left bundle branch block   . Lumbar stenosis   . Meralgia paraesthetica 12/05/2016  . Neuropathy   . OSA (obstructive sleep apnea) 05/24/2016   Overview:  Managed PULM  . PONV (postoperative nausea and vomiting)    "no N/V with patch"  . Postprocedural state 11/26/2015  . 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[place]) 12/07/2015  . Restless leg 12/07/2015  . Restless leg syndrome   . 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   Overview:  2017: dx  . S/P lumbar laminectomy 11/26/2015  . S/P lumbar spinal fusion 08/29/2016  . Sleep apnea    mild case  no cpap  . Tinnitus 12/05/2016  . Type 2 diabetes mellitus ([place])   . Type 2 diabetes mellitus without complication, with long-term current use of insulin ([place]) 03/14/2016   Overview:  Managed ENDO    Past Surgical History:  Procedure Laterality Date  . ABDOMINAL HYSTERECTOMY  1983  . APPENDECTOMY  Age 41  . BACK SURGERY     FIRST LUMBAR FUSION/ SURGERY APRIL 2012 AND FUSION WITH INSTRUMENTATION SEPT 2012  . CARDIAC CATHETERIZATION     x 2  . CARPAL TUNNEL RELEASE     bil  . CHOLECYSTECTOMY  1990's  . CYSTO EXTRACTION KIDNEY STONES    . EYE SURGERY Bilateral    cataracts  . KIDNEY STONE SURGERY  2008  . Left knee surgery x 2  1996   reconstruction  . LUMBAR DISC SURGERY  08/2016  . Right shoulder surgery  2010   spur    Current Medications: Current Meds  Medication Sig  . allopurinol (ZYLOPRIM) 100 MG tablet daily.  Marland Kitchen aspirin 81 MG tablet Take 81 mg by mouth at bedtime.   Marland Kitchen azithromycin (ZITHROMAX) 250 MG tablet   . carvedilol (COREG) 6.25 MG tablet 2 (two) times daily.  . Continuous Blood Gluc Sensor ([place]) MISC   . DULoxetine (CYMBALTA) 60 MG capsule Take 60 mg by mouth at bedtime.   . gabapentin (NEURONTIN) 300 MG capsule at bedtime.  . insulin aspart (NOVOLOG  FLEXPEN) 100 UNIT/ML FlexPen 3 (three) times daily.  . insulin NPH-regular Human (NOVOLIN 70/30) (70-30) 100 UNIT/ML injection Inject 35 Units into the skin 2 (two) times daily with a meal.  . mirtazapine (REMERON) 15 MG tablet Take 15 mg by mouth at bedtime.  . nitroGLYCERIN (NITROSTAT) 0.4 MG SL tablet Place 0.4 mg under the tongue every 5 (five) minutes as needed for chest pain.   Glory Rosebush VERIO test strip   . pramipexole (MIRAPEX) 0.5 MG tablet Take 1 mg by mouth at  bedtime.   Marland Kitchen PROAIR RESPICLICK 902 (90 Base) MCG/ACT AEPB Inhale 1 puff into the lungs every 6 (six) hours as needed (shortness of breath).   . torsemide (DEMADEX) 20 MG tablet Take 1 tablet (20 mg total) by mouth 2 (two) times daily.  Tyler Aas FLEXTOUCH 200 UNIT/ML SOPN 100 Units at bedtime.   . trolamine salicylate (ASPERCREME) 10 % cream Apply 1 application topically as needed for muscle pain.   Current Facility-Administered Medications for the 08/20/17 encounter (Office Visit) with Richardo Priest, MD  Medication  . triamcinolone acetonide (KENALOG) 10 MG/ML injection 10 mg     Allergies:   Atorvastatin; Talwin [pentazocine]; and Other   Social History   Socioeconomic History  . Marital status: Married    Spouse name: None  . Number of children: None  . Years of education: None  . Highest education level: None  Social Needs  . Financial resource strain: None  . Food insecurity - worry: None  . Food insecurity - inability: None  . Transportation needs - medical: None  . Transportation needs - non-medical: None  Occupational History  . Occupation: Archivist  Tobacco Use  . Smoking status: Never Smoker  . Smokeless tobacco: Never Used  Substance and Sexual Activity  . Alcohol use: Yes    Alcohol/week: 0.0 oz    Comment: Rarely  . Drug use: No  . Sexual activity: None  Other Topics Concern  . None  Social History Narrative  . None     Family History: The patient's family history  includes Asthma in her sister; Bone cancer in her sister; Heart failure in her mother; Hypertension in her father; Lung cancer in her father; Stroke in her father. ROS:   Please see the history of present illness.    All other systems reviewed and are negative.  EKGs/Labs/Other Studies Reviewed:    The following studies were reviewed  Recent Labs: BMP 08/08/17 normal 10/25/2016: BUN 30; Creatinine, Ser 1.07; Hemoglobin 10.6; Platelets 323; Potassium 4.6; Sodium 137  Recent Lipid Panel No results found for: CHOL, TRIG, HDL, CHOLHDL, VLDL, LDLCALC, LDLDIRECT  Physical Exam:    VS:  Ht 5\' 2"  (1.575 m)   Wt 211 lb (95.7 kg)   BMI 38.59 kg/m     Wt Readings from Last 3 Encounters:  08/20/17 211 lb (95.7 kg)  08/06/17 214 lb (97.1 kg)  07/20/17 209 lb 6.4 oz (95 kg)     GEN:  Well nourished, well developed in no acute distress HEENT: Normal NECK: No JVD; No carotid bruits LYMPHATICS: No lymphadenopathy CARDIAC: RRR, no murmurs, rubs, gallops RESPIRATORY:  Clear to auscultation without rales, wheezing or rhonchi  ABDOMEN: Soft, non-tender, non-distended MUSCULOSKELETAL:  No edema; No deformity  SKIN: Warm and dry NEUROLOGIC:  Alert and oriented x 3 PSYCHIATRIC:  Normal affect    Signed, Shirlee More, MD  08/20/2017 3:04 PM    [facility]

## 2017-08-20 NOTE — Patient Instructions (Signed)

## 2017-08-21 DIAGNOSIS — J042 Acute laryngotracheitis: Secondary | ICD-10-CM | POA: Diagnosis not present

## 2017-08-28 DIAGNOSIS — J329 Chronic sinusitis, unspecified: Secondary | ICD-10-CM | POA: Diagnosis not present

## 2017-08-28 DIAGNOSIS — R1310 Dysphagia, unspecified: Secondary | ICD-10-CM | POA: Diagnosis not present

## 2017-09-05 DIAGNOSIS — R05 Cough: Secondary | ICD-10-CM | POA: Diagnosis not present

## 2017-09-05 DIAGNOSIS — R0789 Other chest pain: Secondary | ICD-10-CM | POA: Diagnosis not present

## 2017-09-05 DIAGNOSIS — R1310 Dysphagia, unspecified: Secondary | ICD-10-CM | POA: Diagnosis not present

## 2017-10-10 DIAGNOSIS — M1000 Idiopathic gout, unspecified site: Secondary | ICD-10-CM | POA: Diagnosis not present

## 2017-10-10 DIAGNOSIS — D508 Other iron deficiency anemias: Secondary | ICD-10-CM | POA: Diagnosis not present

## 2017-10-10 DIAGNOSIS — I10 Essential (primary) hypertension: Secondary | ICD-10-CM | POA: Diagnosis not present

## 2017-10-10 DIAGNOSIS — E1140 Type 2 diabetes mellitus with diabetic neuropathy, unspecified: Secondary | ICD-10-CM | POA: Diagnosis not present

## 2017-10-10 DIAGNOSIS — E1142 Type 2 diabetes mellitus with diabetic polyneuropathy: Secondary | ICD-10-CM | POA: Diagnosis not present

## 2017-10-10 DIAGNOSIS — Z6839 Body mass index (BMI) 39.0-39.9, adult: Secondary | ICD-10-CM | POA: Diagnosis not present

## 2017-10-10 DIAGNOSIS — Z1389 Encounter for screening for other disorder: Secondary | ICD-10-CM | POA: Diagnosis not present

## 2017-10-10 DIAGNOSIS — E7849 Other hyperlipidemia: Secondary | ICD-10-CM | POA: Diagnosis not present

## 2017-10-10 DIAGNOSIS — F3289 Other specified depressive episodes: Secondary | ICD-10-CM | POA: Diagnosis not present

## 2017-10-10 DIAGNOSIS — G4730 Sleep apnea, unspecified: Secondary | ICD-10-CM | POA: Diagnosis not present

## 2017-10-10 DIAGNOSIS — I2510 Atherosclerotic heart disease of native coronary artery without angina pectoris: Secondary | ICD-10-CM | POA: Diagnosis not present

## 2017-10-10 DIAGNOSIS — N183 Chronic kidney disease, stage 3 (moderate): Secondary | ICD-10-CM | POA: Diagnosis not present

## 2017-10-12 DIAGNOSIS — F3340 Major depressive disorder, recurrent, in remission, unspecified: Secondary | ICD-10-CM | POA: Diagnosis not present

## 2017-10-12 DIAGNOSIS — J019 Acute sinusitis, unspecified: Secondary | ICD-10-CM | POA: Diagnosis not present

## 2017-10-22 DIAGNOSIS — K219 Gastro-esophageal reflux disease without esophagitis: Secondary | ICD-10-CM | POA: Diagnosis not present

## 2017-10-22 DIAGNOSIS — J329 Chronic sinusitis, unspecified: Secondary | ICD-10-CM | POA: Diagnosis not present

## 2017-11-21 DIAGNOSIS — Z6838 Body mass index (BMI) 38.0-38.9, adult: Secondary | ICD-10-CM | POA: Diagnosis not present

## 2017-11-21 DIAGNOSIS — Z794 Long term (current) use of insulin: Secondary | ICD-10-CM | POA: Diagnosis not present

## 2017-11-21 DIAGNOSIS — E1140 Type 2 diabetes mellitus with diabetic neuropathy, unspecified: Secondary | ICD-10-CM | POA: Diagnosis not present

## 2017-11-21 DIAGNOSIS — I10 Essential (primary) hypertension: Secondary | ICD-10-CM | POA: Diagnosis not present

## 2017-11-21 DIAGNOSIS — N183 Chronic kidney disease, stage 3 (moderate): Secondary | ICD-10-CM | POA: Diagnosis not present

## 2017-11-27 DIAGNOSIS — R748 Abnormal levels of other serum enzymes: Secondary | ICD-10-CM | POA: Diagnosis not present

## 2017-11-27 DIAGNOSIS — D500 Iron deficiency anemia secondary to blood loss (chronic): Secondary | ICD-10-CM | POA: Diagnosis not present

## 2017-11-27 DIAGNOSIS — Z86010 Personal history of colonic polyps: Secondary | ICD-10-CM | POA: Diagnosis not present

## 2017-11-27 DIAGNOSIS — R1310 Dysphagia, unspecified: Secondary | ICD-10-CM | POA: Diagnosis not present

## 2017-12-12 DIAGNOSIS — K219 Gastro-esophageal reflux disease without esophagitis: Secondary | ICD-10-CM | POA: Diagnosis not present

## 2017-12-12 DIAGNOSIS — K2970 Gastritis, unspecified, without bleeding: Secondary | ICD-10-CM | POA: Diagnosis not present

## 2017-12-12 DIAGNOSIS — Z7982 Long term (current) use of aspirin: Secondary | ICD-10-CM | POA: Diagnosis not present

## 2017-12-12 DIAGNOSIS — I2510 Atherosclerotic heart disease of native coronary artery without angina pectoris: Secondary | ICD-10-CM | POA: Diagnosis not present

## 2017-12-12 DIAGNOSIS — Z79899 Other long term (current) drug therapy: Secondary | ICD-10-CM | POA: Diagnosis not present

## 2017-12-12 DIAGNOSIS — Z86010 Personal history of colonic polyps: Secondary | ICD-10-CM | POA: Diagnosis not present

## 2017-12-12 DIAGNOSIS — I447 Left bundle-branch block, unspecified: Secondary | ICD-10-CM | POA: Diagnosis not present

## 2017-12-12 DIAGNOSIS — E785 Hyperlipidemia, unspecified: Secondary | ICD-10-CM | POA: Diagnosis not present

## 2017-12-12 DIAGNOSIS — D500 Iron deficiency anemia secondary to blood loss (chronic): Secondary | ICD-10-CM | POA: Diagnosis not present

## 2017-12-12 DIAGNOSIS — Z794 Long term (current) use of insulin: Secondary | ICD-10-CM | POA: Diagnosis not present

## 2017-12-12 DIAGNOSIS — Z1211 Encounter for screening for malignant neoplasm of colon: Secondary | ICD-10-CM | POA: Diagnosis not present

## 2017-12-12 DIAGNOSIS — K635 Polyp of colon: Secondary | ICD-10-CM | POA: Diagnosis not present

## 2017-12-12 DIAGNOSIS — G2581 Restless legs syndrome: Secondary | ICD-10-CM | POA: Diagnosis not present

## 2017-12-12 DIAGNOSIS — R1310 Dysphagia, unspecified: Secondary | ICD-10-CM | POA: Diagnosis not present

## 2017-12-12 DIAGNOSIS — M109 Gout, unspecified: Secondary | ICD-10-CM | POA: Diagnosis not present

## 2017-12-12 DIAGNOSIS — M1990 Unspecified osteoarthritis, unspecified site: Secondary | ICD-10-CM | POA: Diagnosis not present

## 2017-12-12 DIAGNOSIS — K2900 Acute gastritis without bleeding: Secondary | ICD-10-CM | POA: Diagnosis not present

## 2017-12-12 DIAGNOSIS — I10 Essential (primary) hypertension: Secondary | ICD-10-CM | POA: Diagnosis not present

## 2017-12-12 DIAGNOSIS — D126 Benign neoplasm of colon, unspecified: Secondary | ICD-10-CM | POA: Diagnosis not present

## 2017-12-12 DIAGNOSIS — E119 Type 2 diabetes mellitus without complications: Secondary | ICD-10-CM | POA: Diagnosis not present

## 2017-12-12 DIAGNOSIS — D122 Benign neoplasm of ascending colon: Secondary | ICD-10-CM | POA: Diagnosis not present

## 2017-12-12 DIAGNOSIS — K648 Other hemorrhoids: Secondary | ICD-10-CM | POA: Diagnosis not present

## 2017-12-12 DIAGNOSIS — K5730 Diverticulosis of large intestine without perforation or abscess without bleeding: Secondary | ICD-10-CM | POA: Diagnosis not present

## 2017-12-12 DIAGNOSIS — K449 Diaphragmatic hernia without obstruction or gangrene: Secondary | ICD-10-CM | POA: Diagnosis not present

## 2017-12-12 HISTORY — PX: ESOPHAGOGASTRODUODENOSCOPY: SHX1529

## 2017-12-12 HISTORY — PX: COLONOSCOPY: SHX174

## 2017-12-24 DIAGNOSIS — L299 Pruritus, unspecified: Secondary | ICD-10-CM | POA: Diagnosis not present

## 2017-12-24 DIAGNOSIS — L309 Dermatitis, unspecified: Secondary | ICD-10-CM | POA: Diagnosis not present

## 2018-02-05 DIAGNOSIS — Z1389 Encounter for screening for other disorder: Secondary | ICD-10-CM | POA: Diagnosis not present

## 2018-02-05 DIAGNOSIS — N200 Calculus of kidney: Secondary | ICD-10-CM | POA: Diagnosis not present

## 2018-02-05 DIAGNOSIS — M1000 Idiopathic gout, unspecified site: Secondary | ICD-10-CM | POA: Diagnosis not present

## 2018-02-05 DIAGNOSIS — I2510 Atherosclerotic heart disease of native coronary artery without angina pectoris: Secondary | ICD-10-CM | POA: Diagnosis not present

## 2018-02-05 DIAGNOSIS — N183 Chronic kidney disease, stage 3 (moderate): Secondary | ICD-10-CM | POA: Diagnosis not present

## 2018-02-05 DIAGNOSIS — I10 Essential (primary) hypertension: Secondary | ICD-10-CM | POA: Diagnosis not present

## 2018-02-05 DIAGNOSIS — E1142 Type 2 diabetes mellitus with diabetic polyneuropathy: Secondary | ICD-10-CM | POA: Diagnosis not present

## 2018-02-05 DIAGNOSIS — R945 Abnormal results of liver function studies: Secondary | ICD-10-CM | POA: Diagnosis not present

## 2018-02-05 DIAGNOSIS — E7849 Other hyperlipidemia: Secondary | ICD-10-CM | POA: Diagnosis not present

## 2018-02-05 DIAGNOSIS — G4730 Sleep apnea, unspecified: Secondary | ICD-10-CM | POA: Diagnosis not present

## 2018-02-05 DIAGNOSIS — E1149 Type 2 diabetes mellitus with other diabetic neurological complication: Secondary | ICD-10-CM | POA: Diagnosis not present

## 2018-03-08 DIAGNOSIS — D485 Neoplasm of uncertain behavior of skin: Secondary | ICD-10-CM | POA: Diagnosis not present

## 2018-03-08 DIAGNOSIS — D225 Melanocytic nevi of trunk: Secondary | ICD-10-CM | POA: Diagnosis not present

## 2018-03-08 DIAGNOSIS — L814 Other melanin hyperpigmentation: Secondary | ICD-10-CM | POA: Diagnosis not present

## 2018-03-08 DIAGNOSIS — L578 Other skin changes due to chronic exposure to nonionizing radiation: Secondary | ICD-10-CM | POA: Diagnosis not present

## 2018-03-08 DIAGNOSIS — D2239 Melanocytic nevi of other parts of face: Secondary | ICD-10-CM | POA: Diagnosis not present

## 2018-03-28 DIAGNOSIS — H524 Presbyopia: Secondary | ICD-10-CM | POA: Diagnosis not present

## 2018-03-28 DIAGNOSIS — E119 Type 2 diabetes mellitus without complications: Secondary | ICD-10-CM | POA: Diagnosis not present

## 2018-04-01 DIAGNOSIS — E119 Type 2 diabetes mellitus without complications: Secondary | ICD-10-CM | POA: Diagnosis not present

## 2018-04-01 DIAGNOSIS — Z794 Long term (current) use of insulin: Secondary | ICD-10-CM | POA: Diagnosis not present

## 2018-04-01 DIAGNOSIS — Z0000 Encounter for general adult medical examination without abnormal findings: Secondary | ICD-10-CM | POA: Diagnosis not present

## 2018-04-06 ENCOUNTER — Other Ambulatory Visit: Payer: Self-pay | Admitting: Cardiology

## 2018-04-06 DIAGNOSIS — I10 Essential (primary) hypertension: Secondary | ICD-10-CM

## 2018-04-06 DIAGNOSIS — I447 Left bundle-branch block, unspecified: Secondary | ICD-10-CM

## 2018-04-08 ENCOUNTER — Other Ambulatory Visit: Payer: Self-pay | Admitting: Cardiology

## 2018-04-08 ENCOUNTER — Other Ambulatory Visit: Payer: Self-pay

## 2018-04-08 DIAGNOSIS — I10 Essential (primary) hypertension: Secondary | ICD-10-CM

## 2018-04-08 DIAGNOSIS — I447 Left bundle-branch block, unspecified: Secondary | ICD-10-CM

## 2018-04-08 NOTE — Telephone Encounter (Signed)
Refill was sent

## 2018-04-08 NOTE — Telephone Encounter (Signed)
°*  STAT* If patient is at the pharmacy, call can be transferred to refill team.   1. Which medications need to be refilled? (please list name of each medication and dose if known) Furisemide   2. Which pharmacy/location (including street and city if local pharmacy) is medication to be sent to [facility]  3. Do they need a 30 day or 90 day supply? [place]

## 2018-05-06 NOTE — Progress Notes (Signed)
Cardiology Office Note:    Date:  05/07/2018   ID:  Katelyn [name], DOB 05-16-1956, MRN [id number]  PCP:  Katelyn Greenhouse, MD  Cardiologist:  Katelyn More, MD    Referring MD: Katelyn Greenhouse, MD    ASSESSMENT:    1. Hypertensive heart disease with heart failure ([place])   2. LBBB (left bundle branch block)   3. Mild CAD   4. Hyperlipidemia, unspecified hyperlipidemia type   5. Essential hypertension   6. Chronic diastolic heart failure (HCC)    PLAN:    In order of problems listed above:  1. She is decompensated fluid overloaded New York Heart Association class III weight is approximately 10 pounds I will switch her diuretic to twice daily until she returns to baseline of less than 200 pounds.  Check BMP for renal function and proBNP for heart failure 2. Stable 3. Stable continue medical treatment she is having no anginal discomfort 4. Presently not on statin I will explore this next visit 5. Blood pressure target continue current treatment 6. See #1   Next appointment: 2 to 3 weeks   Medication Adjustments/Labs and Tests Ordered: Current medicines are reviewed at length with the patient today.  Concerns regarding medicines are outlined above.  Orders Placed This Encounter  Procedures  . Basic Metabolic Panel (BMET)  . Pro b natriuretic peptide (BNP)   Meds ordered this encounter  Medications  . torsemide (DEMADEX) 20 MG tablet    Sig: Take 1 tablet (20 mg total) by mouth 2 (two) times daily. Take once a day for weight < 200 lbs    Dispense:  180 tablet    Refill:  3  . pantoprazole (PROTONIX) 40 MG tablet    Sig: Take 1 tablet (40 mg total) by mouth daily.    Dispense:  90 tablet    Refill:  3    Chief Complaint  Patient presents with  . Follow-up    6 month follow up visit.     History of Present Illness:    [name] is a 62 y.o. female with a hx of hypertension mild CAD dyslipidemia left bundle branch block with an ejection fraction of 55 to 60%  restrictive lung disease with shortness of breath due to deconditioning and obesity.  She was last seen 08/20/2017 with decompensated heart failure. Compliance with diet, lifestyle and medications: Yes  Recently she traveled weight is up about 10 pounds she notices edema and increasing shortness of breath with any activity as well as orthopnea.  She is not short of breath at rest.  No chest pain palpitations syncope or TIA.  Presently takes torsemide 20 mg once daily Past Medical History:  Diagnosis Date  . Anemia    previous transfusion 2/17  . Angina pectoris ([place]) 11/24/2015  . Anxiety   . Arthritis   . Benign hypertension 03/14/2016  . Bursitis of right hip   . CAD (coronary artery disease)    Cardiac catheterization June 2014 in [facility] - 50% circumflex stenosis  . CAD in native artery 11/24/2015  . Cannot sleep 12/07/2015  . Chronic kidney disease    does not see nephrologist  . Chronic low back pain without sciatica 03/14/2016  . Complication of anesthesia   . Cough 04/19/2017   Overview:  Last Assessment & Plan:  Formatting of this note may be different from the original. Cough - ? ACE related with AR triggers   Plan  Patient Instructions  Discuss  with your primary doctor that lisinopril pain, need making your cough worse. May use Mucinex DM twice daily as needed for cough and congestion Zyrtec 10 mg at bedtime as needed for drainage Saline nasal spray as needed. Lab tests today Activity as tolerated. Follow with Dr. [name] in 3-4 months and As needed   Please contact office for sooner follow up if symptoms do not improve or worsen or seek emergency care   . Depression   . DOE (dyspnea on exertion) 04/19/2017   Overview:  2017:   Last Assessment & Plan:  Formatting of this note may be different from the original. DOE ? Etiology , felt to be component of deconditioning  Workup has been unrevealing with neg VQ scan , PFT and CXR , reported neg card w/up  Would cont exrecise regimen as tolerate   Plan  Patient Instructions  Discuss with your primary doctor that lisinopril pain, need making your cough worse. May use Mucinex DM twice daily as needed for cough and congestion Zyrtec 10 mg at bedtime as needed for drainage Saline nasal spray as needed. Lab tests today Activity as tolerated. Follow with Dr. [name] in 3-4 months and As needed   Please contact office for sooner follow up if symptoms do not improve or worsen or seek emergency care   . Dyspnea    with exertion   " lazy lung" - per  [name] from back issues- 06/2016  . Elevated liver enzymes 12/05/2016  . Essential hypertension   . GERD (gastroesophageal reflux disease)   . Gout 03/14/2016  . Greater trochanteric bursitis of right hip 02/02/2012  . H/O hiatal hernia   . Heart murmur   . History of blood transfusion 2016  . History of kidney stones   . Hypercholesterolemia   . Hyperlipemia 01/01/2017  . Iliotibial band syndrome of right side 02/02/2012  . Iron deficiency anemia due to chronic blood loss 03/14/2016  . LBBB (left bundle branch block) 01/01/2017  . Left bundle branch block   . Lumbar stenosis   . Meralgia paraesthetica 12/05/2016  . Neuropathy   . OSA (obstructive sleep apnea) 05/24/2016   Overview:  Managed PULM  . PONV (postoperative nausea and vomiting)    "no N/V with patch"  . Postprocedural state 11/26/2015  . 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[place]) 12/07/2015  . Restless leg 12/07/2015  . Restless leg syndrome   . 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   Overview:  2017: dx  . S/P lumbar laminectomy 11/26/2015  . S/P lumbar spinal fusion 08/29/2016  . Sleep apnea    mild case  no cpap  . Tinnitus 12/05/2016  . Type 2 diabetes mellitus ([place])   . Type 2 diabetes mellitus without complication, with long-term current use of insulin ([place]) 03/14/2016   Overview:  Managed ENDO    Past Surgical History:  Procedure Laterality Date  . ABDOMINAL HYSTERECTOMY  1983  . APPENDECTOMY  Age 89  . BACK SURGERY     FIRST  LUMBAR FUSION/ SURGERY APRIL 2012 AND FUSION WITH INSTRUMENTATION SEPT 2012  . CARDIAC CATHETERIZATION     x 2  . CARPAL TUNNEL RELEASE     bil  . CHOLECYSTECTOMY  1990's  . CYSTO EXTRACTION KIDNEY STONES    . EXCISION/RELEASE BURSA HIP  02/02/2012   Procedure: EXCISION/RELEASE BURSA HIP;  Surgeon: Tobi Bastos, MD;  Location: [facility];  Service: Orthopedics;  Laterality: Right;  Right Hip Bursectomy  . EYE SURGERY Bilateral  cataracts  . KIDNEY STONE SURGERY  2008  . Left knee surgery x 2  1996   reconstruction  . LUMBAR DISC SURGERY  08/2016  . LUMBAR LAMINECTOMY/DECOMPRESSION MICRODISCECTOMY Right 11/26/2015   Procedure: Extraforaminal Microdiscectomy  - Lumbar two-three- right;  Surgeon: Eustace Moore, MD;  Location: [facility];  Service: Neurosurgery;  Laterality: Right;  right   . LUMBAR WOUND DEBRIDEMENT N/A 10/25/2016   Procedure: Lumbar wound revision;  Surgeon: Eustace Moore, MD;  Location: [facility];  Service: Neurosurgery;  Laterality: N/A;  Lumbar wound revision  . Right shoulder surgery  2010   spur    Current Medications: Current Meds  Medication Sig  . aspirin 81 MG tablet Take 81 mg by mouth at bedtime.   . carvedilol (COREG) 6.25 MG tablet 2 (two) times daily.  . DULoxetine (CYMBALTA) 60 MG capsule Take 60 mg by mouth at bedtime.   . insulin NPH-regular Human (NOVOLIN 70/30) (70-30) 100 UNIT/ML injection Inject 35 Units into the skin 2 (two) times daily with a meal.  . mirtazapine (REMERON) 15 MG tablet Take 15 mg by mouth at bedtime.  . nitroGLYCERIN (NITROSTAT) 0.4 MG SL tablet Place 0.4 mg under the tongue every 5 (five) minutes as needed for chest pain.   Glory Rosebush VERIO test strip   . pantoprazole (PROTONIX) 40 MG tablet Take 1 tablet (40 mg total) by mouth daily.  Marland Kitchen PROAIR RESPICLICK 469 (90 Base) MCG/ACT AEPB Inhale 1 puff into the lungs every 6 (six) hours as needed (shortness of breath).   . torsemide (DEMADEX) 20 MG tablet Take 1 tablet (20 mg total) by  mouth 2 (two) times daily. Take once a day for weight < 200 lbs  . trolamine salicylate (ASPERCREME) 10 % cream Apply 1 application topically as needed for muscle pain.  . [DISCONTINUED] pantoprazole (PROTONIX) 40 MG tablet Take 40 mg by mouth daily.  . [DISCONTINUED] torsemide (DEMADEX) 20 MG tablet Take 1 tablet (20 mg total) by mouth 2 (two) times daily.   Current Facility-Administered Medications for the 05/07/18 encounter (Office Visit) with Richardo Priest, MD  Medication  . triamcinolone acetonide (KENALOG) 10 MG/ML injection 10 mg     Allergies:   Atorvastatin; Talwin [pentazocine]; and Other   Social History   Socioeconomic History  . Marital status: Married    Spouse name: Not on file  . Number of children: Not on file  . Years of education: Not on file  . Highest education level: Not on file  Occupational History  . Occupation: Archivist  Social Needs  . Financial resource strain: Not on file  . Food insecurity:    Worry: Not on file    Inability: Not on file  . Transportation needs:    Medical: Not on file    Non-medical: Not on file  Tobacco Use  . Smoking status: Never Smoker  . Smokeless tobacco: Never Used  Substance and Sexual Activity  . Alcohol use: Yes    Alcohol/week: 0.0 oz    Comment: Rarely  . Drug use: No  . Sexual activity: Not on file  Lifestyle  . Physical activity:    Days per week: Not on file    Minutes per session: Not on file  . Stress: Not on file  Relationships  . Social connections:    Talks on phone: Not on file    Gets together: Not on file    Attends religious service: Not on file  Active member of club or organization: Not on file    Attends meetings of clubs or organizations: Not on file    Relationship status: Not on file  Other Topics Concern  . Not on file  Social History Narrative  . Not on file     Family History: The patient's family history includes Asthma in her sister; Bone cancer in her  sister; Heart failure in her mother; Hypertension in her father; Lung cancer in her father; Stroke in her father. ROS:   Please see the history of present illness.    All other systems reviewed and are negative.  EKGs/Labs/Other Studies Reviewed:    The following studies were reviewed today:   Recent Labs: No results found for requested labs within last 8760 hours.  Recent Lipid Panel No results found for: CHOL, TRIG, HDL, CHOLHDL, VLDL, LDLCALC, LDLDIRECT  Physical Exam:    VS:  BP 124/84 (BP Location: Left Arm, Patient Position: Sitting, Cuff Size: Large)   Pulse 77   Wt 215 lb (97.5 kg)   SpO2 97%   BMI 39.32 kg/m     Wt Readings from Last 3 Encounters:  05/07/18 215 lb (97.5 kg)  08/20/17 211 lb (95.7 kg)  08/06/17 214 lb (97.1 kg)     GEN:  Well nourished, well developed in no acute distress HEENT: Normal NECK: No JVD; No carotid bruits LYMPHATICS: No lymphadenopathy CARDIAC: paradoxical S2 RRR, no murmurs, rubs, gallops RESPIRATORY:  Clear to auscultation without rales, wheezing or rhonchi  ABDOMEN: Soft, non-tender, non-distended MUSCULOSKELETAL:  2-3+ edema; No deformity  SKIN: Warm and dry NEUROLOGIC:  Alert and oriented x 3 PSYCHIATRIC:  Normal affect    Signed, Katelyn More, MD  05/07/2018 4:55 PM    [facility]

## 2018-05-07 ENCOUNTER — Encounter: Payer: Self-pay | Admitting: Cardiology

## 2018-05-07 ENCOUNTER — Ambulatory Visit: Payer: PPO | Admitting: Cardiology

## 2018-05-07 VITALS — BP 124/84 | HR 77 | Wt 215.0 lb

## 2018-05-07 DIAGNOSIS — I5032 Chronic diastolic (congestive) heart failure: Secondary | ICD-10-CM | POA: Diagnosis not present

## 2018-05-07 DIAGNOSIS — I110 Hypertensive heart disease with heart failure: Secondary | ICD-10-CM | POA: Diagnosis not present

## 2018-05-07 DIAGNOSIS — E785 Hyperlipidemia, unspecified: Secondary | ICD-10-CM

## 2018-05-07 DIAGNOSIS — I447 Left bundle-branch block, unspecified: Secondary | ICD-10-CM | POA: Diagnosis not present

## 2018-05-07 DIAGNOSIS — I2510 Atherosclerotic heart disease of native coronary artery without angina pectoris: Secondary | ICD-10-CM

## 2018-05-07 MED ORDER — TORSEMIDE 20 MG PO TABS: 20.0000 mg | ORAL_TABLET | Freq: Two times a day (BID) | ORAL | 3 refills | Status: DC

## 2018-05-07 MED ORDER — PANTOPRAZOLE SODIUM 40 MG PO TBEC: 40.0000 mg | DELAYED_RELEASE_TABLET | Freq: Every day | ORAL | 3 refills | Status: DC

## 2018-05-07 NOTE — Patient Instructions (Addendum)
Medication Instructions:  Your physician has recommended you make the following change in your medication:   START: Protonix 40 mg daily and Torsemide 20 mg twice daily.    Labwork: Your physician recommends that you return for lab work today: BNP and pro BNP.  Testing/Procedures: None.  Follow-Up: Your physician wants you to follow-up in: 3 months. You will receive a reminder letter in the mail two months in advance. If you don't receive a letter, please call our office to schedule the follow-up appointment.    Any Other Special Instructions Will Be Listed Below (If Applicable).     If you need a refill on your cardiac medications before your next appointment, please call your pharmacy.    Heart Failure  Weigh yourself every morning when you first wake up and record on a calender or note pad, bring this to your office visits. Using a pill tender can help with taking your medications consistently.  Limit your fluid intake to 2 liters daily  Limit your sodium intake to less than 2-3 grams daily. Ask if you need dietary teaching.  If you gain more than 3 pounds (from your dry weight ), double your dose of diuretic for the day.  If you gain more than 5 pounds (from your dry weight), double your dose of lasix and call your heart failure doctor.  Please do not smoke tobacco since it is very bad for your heart.  Please do not drink alcohol since it can worsen your heart failure.Also avoid OTC nonsteroidal drugs, such as advil, aleve and motrin.  Try to exercise for at least 30 minutes every day because this will help your heart be more efficient. You may be eligible for supervised cardiac rehab, ask your physician.

## 2018-05-08 LAB — BASIC METABOLIC PANEL
BUN/Creatinine Ratio: 22 (ref 12–28)
BUN: 28 mg/dL — AB (ref 8–27)
CHLORIDE: 94 mmol/L — AB (ref 96–106)
CO2: 27 mmol/L (ref 20–29)
Calcium: 9.6 mg/dL (ref 8.7–10.3)
Creatinine, Ser: 1.25 mg/dL — ABNORMAL HIGH (ref 0.57–1.00)
GFR calc non Af Amer: 47 mL/min/{1.73_m2} — ABNORMAL LOW (ref >59)
GFR, EST AFRICAN AMERICAN: 54 mL/min/{1.73_m2} — AB (ref >59)
Glucose: 267 mg/dL — ABNORMAL HIGH (ref 65–99)
POTASSIUM: 4.4 mmol/L (ref 3.5–5.2)
Sodium: 141 mmol/L (ref 134–144)

## 2018-05-08 LAB — PRO B NATRIURETIC PEPTIDE: NT-PRO BNP: 43 pg/mL (ref 0–287)

## 2018-06-04 DIAGNOSIS — I10 Essential (primary) hypertension: Secondary | ICD-10-CM | POA: Diagnosis not present

## 2018-06-04 DIAGNOSIS — K222 Esophageal obstruction: Secondary | ICD-10-CM | POA: Diagnosis not present

## 2018-06-04 DIAGNOSIS — M1000 Idiopathic gout, unspecified site: Secondary | ICD-10-CM | POA: Diagnosis not present

## 2018-06-04 DIAGNOSIS — N200 Calculus of kidney: Secondary | ICD-10-CM | POA: Diagnosis not present

## 2018-06-04 DIAGNOSIS — R0609 Other forms of dyspnea: Secondary | ICD-10-CM | POA: Diagnosis not present

## 2018-06-04 DIAGNOSIS — E1140 Type 2 diabetes mellitus with diabetic neuropathy, unspecified: Secondary | ICD-10-CM | POA: Diagnosis not present

## 2018-06-04 DIAGNOSIS — I2510 Atherosclerotic heart disease of native coronary artery without angina pectoris: Secondary | ICD-10-CM | POA: Diagnosis not present

## 2018-06-04 DIAGNOSIS — E7849 Other hyperlipidemia: Secondary | ICD-10-CM | POA: Diagnosis not present

## 2018-06-04 DIAGNOSIS — E1142 Type 2 diabetes mellitus with diabetic polyneuropathy: Secondary | ICD-10-CM | POA: Diagnosis not present

## 2018-06-04 DIAGNOSIS — N183 Chronic kidney disease, stage 3 (moderate): Secondary | ICD-10-CM | POA: Diagnosis not present

## 2018-06-04 DIAGNOSIS — D6489 Other specified anemias: Secondary | ICD-10-CM | POA: Diagnosis not present

## 2018-06-13 ENCOUNTER — Ambulatory Visit: Payer: PPO | Admitting: Cardiology

## 2018-08-07 ENCOUNTER — Encounter: Payer: Self-pay | Admitting: Sports Medicine

## 2018-08-07 ENCOUNTER — Ambulatory Visit: Payer: PPO | Admitting: Sports Medicine

## 2018-08-07 ENCOUNTER — Other Ambulatory Visit: Payer: Self-pay | Admitting: Sports Medicine

## 2018-08-07 ENCOUNTER — Ambulatory Visit (INDEPENDENT_AMBULATORY_CARE_PROVIDER_SITE_OTHER): Payer: PPO

## 2018-08-07 DIAGNOSIS — M25571 Pain in right ankle and joints of right foot: Secondary | ICD-10-CM

## 2018-08-07 DIAGNOSIS — M79671 Pain in right foot: Secondary | ICD-10-CM | POA: Diagnosis not present

## 2018-08-07 DIAGNOSIS — M779 Enthesopathy, unspecified: Secondary | ICD-10-CM

## 2018-08-07 MED ORDER — TRIAMCINOLONE ACETONIDE 10 MG/ML IJ SUSP: 10.0000 mg | Freq: Once | INTRAMUSCULAR | Status: DC

## 2018-08-07 NOTE — Progress Notes (Signed)
Subjective: Katelyn [name] is a 62 y.o. diabetic female patient who presents to office for evaluation of right foot pain. Patient complains of progressive pain especially over the last 2 weeks at the right foot and ankle states that around that time she was doing some mowing and had pain the next day but it got better however the last day or 2 she has been doing yard work and started to have a lot of pain over the area states that walking it feels like she has twisted her foot inward however patient denies any known injury or trauma but does admit that her yard and is on uneven terrain. Ranks pain 10/10 and is now interferring with daily activities. Patient has tried Aleve and gentle stretching with no relief in symptoms.  Patient reports that pain was so bad she had difficulty with sleeping last night.  Patient denies any other pedal complaints.   Patient is diabetic with last A1c recorded at 7.9 and last saw her primary care doctor this month and will see again in January.  Patient admits that her blood sugar today was 119.  Patient Active Problem List   Diagnosis Date Noted  . Chronic diastolic heart failure ([place]) 08/20/2017  . Genetic testing 08/17/2017  . Family history of ovarian cancer 08/17/2017  . Family history of breast cancer 08/17/2017  . Cough 04/19/2017  . DOE (dyspnea on exertion) 04/19/2017  . Hypertensive heart disease with heart failure ([place]) 01/01/2017  . Hyperlipemia 01/01/2017  . LBBB (left bundle branch block) 01/01/2017  . Elevated liver enzymes 12/05/2016  . Meralgia paraesthetica 12/05/2016  . Tinnitus 12/05/2016  . S/P lumbar spinal fusion 08/29/2016  . OSA (obstructive sleep apnea) 05/24/2016  . 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  . Chronic low back pain without sciatica 03/14/2016  . GERD (gastroesophageal reflux disease) 03/14/2016  . Gout 03/14/2016  . Iron deficiency anemia due to chronic blood loss 03/14/2016  . Type 2 diabetes mellitus without complication,  with long-term current use of insulin ([place]) 03/14/2016  . Cannot sleep 12/07/2015  . 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[place]) 12/07/2015  . Restless leg 12/07/2015  . S/P lumbar laminectomy 11/26/2015  . Postprocedural state 11/26/2015  . Mild CAD 11/24/2015  . Greater trochanteric bursitis of right hip 02/02/2012  . Iliotibial band syndrome of right side 02/02/2012  . Iliotibial band syndrome 02/02/2012  . Bursitis, trochanteric 02/02/2012    Current Outpatient Medications on File Prior to Visit  Medication Sig Dispense Refill  . aspirin 81 MG tablet Take 81 mg by mouth at bedtime.     . carvedilol (COREG) 6.25 MG tablet 2 (two) times daily.    . DULoxetine (CYMBALTA) 60 MG capsule Take 60 mg by mouth at bedtime.     . insulin NPH-regular Human (NOVOLIN 70/30) (70-30) 100 UNIT/ML injection Inject 35 Units into the skin 2 (two) times daily with a meal.    . mirtazapine (REMERON) 15 MG tablet Take 15 mg by mouth at bedtime.    . nitroGLYCERIN (NITROSTAT) 0.4 MG SL tablet Place 0.4 mg under the tongue every 5 (five) minutes as needed for chest pain.     Glory Rosebush VERIO test strip     . pantoprazole (PROTONIX) 40 MG tablet Take 1 tablet (40 mg total) by mouth daily. 90 tablet 3  . pramipexole (MIRAPEX) 0.5 MG tablet Take 1 mg by mouth at bedtime.     Marland Kitchen PROAIR RESPICLICK 409 (90 Base) MCG/ACT AEPB Inhale 1 puff into the  lungs every 6 (six) hours as needed (shortness of breath).     . torsemide (DEMADEX) 20 MG tablet Take 1 tablet (20 mg total) by mouth 2 (two) times daily. Take once a day for weight < 200 lbs 180 tablet 3  . trolamine salicylate (ASPERCREME) 10 % cream Apply 1 application topically as needed for muscle pain.     Current Facility-Administered Medications on File Prior to Visit  Medication Dose Route Frequency Provider Last Rate Last Dose  . triamcinolone acetonide (KENALOG) 10 MG/ML injection 10 mg  10 mg Other Once Landis Martins, DPM        Allergies  Allergen  Reactions  . Atorvastatin Nausea And Vomiting and Other (See Comments)    MYALGIAS  . Talwin [Pentazocine] Other (See Comments)    headache  . Other Nausea Only    UNSPECIFIED Anesthesia    Objective:  General: Alert and oriented x3 in no acute distress  Dermatology: No open lesions bilateral lower extremities, no webspace macerations, no ecchymosis bilateral, all nails x 10 are well manicured.  Patient trimmed her ingrown toenail out herself at her right great toe without incident.  Vascular: Dorsalis Pedis and Posterior Tibial pedal pulses palpable, Capillary Fill Time 3 seconds,(+) pedal hair growth bilateral, no edema bilateral lower extremities, Temperature gradient within normal limits.  Neurology: Johney Maine sensation intact via light touch bilateral, (- )Tinels sign bilateral.   Musculoskeletal: Mild tenderness with palpation at right dorsal lateral foot over the sinus tarsi and there is pain with range of motion especially inversion eversion of foot.  There is mild pain with end range of stretching especially at the Achilles on the right.  No other symptoms noted.  Xrays  Right foot   Impression: Normal osseous mineralization there is no fracture or dislocation soft tissues within normal limits however there is mild thickening of the plantar fashion noted and very small heel spur otherwise no other acute findings.  Assessment and Plan: Problem List Items Addressed This Visit    None    Visit Diagnoses    Sinus tarsi syndrome, right    -  Primary   Relevant Medications   triamcinolone acetonide (KENALOG) 10 MG/ML injection 10 mg   Right foot pain       Relevant Orders   DG Foot Complete Right   Tendonitis       Capsulitis          -Complete examination performed -Xrays reviewed -Discussed treatement options tendinitis versus sinus tarsi syndrome -After oral consent and aseptic prep, injected a mixture containing 1 ml of 2%  plain lidocaine, 1 ml 0.5% plain marcaine, 0.5  ml of kenalog 10 and 0.5 ml of dexamethasone phosphate into right foot at the sinus tarsi without complication. Post-injection care discussed with patient.  -Dispensed plantar fascial brace to use to protect ankle to prevent any lateral overuse especially while lateral foot and ankle is painful -Recommend rest ice elevation and over-the-counter Aleve as needed.  Advised patient if pain is not better we will proceed with ordering a MRI for further evaluation. -Patient to return to office in 2-3 weeks or sooner if condition worsens.  Landis Martins, DPM

## 2018-08-09 ENCOUNTER — Ambulatory Visit: Payer: PPO | Admitting: Cardiology

## 2018-08-21 ENCOUNTER — Ambulatory Visit (INDEPENDENT_AMBULATORY_CARE_PROVIDER_SITE_OTHER): Payer: PPO | Admitting: Cardiology

## 2018-08-21 VITALS — BP 110/70 | HR 73 | Ht 62.0 in | Wt 210.0 lb

## 2018-08-21 DIAGNOSIS — I2510 Atherosclerotic heart disease of native coronary artery without angina pectoris: Secondary | ICD-10-CM

## 2018-08-21 DIAGNOSIS — I447 Left bundle-branch block, unspecified: Secondary | ICD-10-CM

## 2018-08-21 DIAGNOSIS — I110 Hypertensive heart disease with heart failure: Secondary | ICD-10-CM | POA: Diagnosis not present

## 2018-08-21 NOTE — Progress Notes (Signed)
Cardiology Office Note:    Date:  08/21/2018   ID:  Katelyn [name], DOB [birth year], MRN [id number]  PCP:  Algis Greenhouse, MD  Cardiologist:  Shirlee More, MD    Referring MD: Algis Greenhouse, MD    ASSESSMENT:    1. Hypertensive heart disease with heart failure ([place])   2. LBBB (left bundle branch block)   3. Mild CAD    PLAN:    In order of problems listed above:  1. Heart failure is compensated continue current loop diuretic sodium restriction home self-management.  Hypertension target continue beta-blocker.  Check renal function and potassium today along with proBNP is a marker of heart failure treatment 2. Stable and most recent EKG 3. Stable no angina New [place] Heart Association class I I discussed with her lipid-lowering therapy she told me she been intolerant of statins we discussed trying a low-dose of a low intensity 1 to 2 days a week or alternate therapy at this time she declines lipid-lowering treatment   Next appointment: 6 months   Medication Adjustments/Labs and Tests Ordered: Current medicines are reviewed at length with the patient today.  Concerns regarding medicines are outlined above.  No orders of the defined types were placed in this encounter.  No orders of the defined types were placed in this encounter.   Chief Complaint  Patient presents with  . Follow-up  . Congestive Heart Failure    History of Present Illness:    Katelyn [name] is a 62 y.o. female with a hx of hypertension mild CAD dyslipidemia left bundle branch block with an ejection fraction of 55 to 60% restrictive lung disease with shortness of breath due to deconditioning and obesity 05/07/2018 last seen .  proBNP level was quite low at that time 43. Compliance with diet, lifestyle and medications: Yes  She sodium restricts weighs daily and her weight is been stable at home.  She is not complaining of shortness of breath edema chest pain palpitation.  Previously intolerant of statins. Past  Medical History:  Diagnosis Date  . Anemia    previous transfusion 2/17  . Angina pectoris ([place]) 11/24/2015  . Anxiety   . Arthritis   . Benign hypertension 03/14/2016  . Bursitis of right hip   . CAD (coronary artery disease)    Cardiac catheterization June 2014 in [facility] - 50% circumflex stenosis  . CAD in native artery 11/24/2015  . Cannot sleep 12/07/2015  . Chronic kidney disease    does not see nephrologist  . Chronic low back pain without sciatica 03/14/2016  . Complication of anesthesia   . Cough 04/19/2017   Overview:  Last Assessment & Plan:  Formatting of this note may be different from the original. Cough - ? ACE related with AR triggers   Plan  Patient Instructions  Discuss with your primary doctor that lisinopril pain, need making your cough worse. May use Mucinex DM twice daily as needed for cough and congestion Zyrtec 10 mg at bedtime as needed for drainage Saline nasal spray as needed. Lab tests today Activity as tolerated. Follow with Dr. [name] in 3-4 months and As needed   Please contact office for sooner follow up if symptoms do not improve or worsen or seek emergency care   . Depression   . DOE (dyspnea on exertion) 04/19/2017   Overview:  2017:   Last Assessment & Plan:  Formatting of this note may be different from the original. DOE ? Etiology , felt  to be component of deconditioning  Workup has been unrevealing with neg VQ scan , PFT and CXR , reported neg card w/up  Would cont exrecise regimen as tolerate  Plan  Patient Instructions  Discuss with your primary doctor that lisinopril pain, need making your cough worse. May use Mucinex DM twice daily as needed for cough and congestion Zyrtec 10 mg at bedtime as needed for drainage Saline nasal spray as needed. Lab tests today Activity as tolerated. Follow with Dr. [name] in 3-4 months and As needed   Please contact office for sooner follow up if symptoms do not improve or worsen or seek emergency care   . Dyspnea    with exertion    " lazy lung" - per  [name] from back issues- 06/2016  . Elevated liver enzymes 12/05/2016  . Essential hypertension   . GERD (gastroesophageal reflux disease)   . Gout 03/14/2016  . Greater trochanteric bursitis of right hip 02/02/2012  . H/O hiatal hernia   . Heart murmur   . History of blood transfusion 2016  . History of kidney stones   . Hypercholesterolemia   . Hyperlipemia 01/01/2017  . Iliotibial band syndrome of right side 02/02/2012  . Iron deficiency anemia due to chronic blood loss 03/14/2016  . LBBB (left bundle branch block) 01/01/2017  . Left bundle branch block   . Lumbar stenosis   . Meralgia paraesthetica 12/05/2016  . Neuropathy   . OSA (obstructive sleep apnea) 05/24/2016   Overview:  Managed PULM  . PONV (postoperative nausea and vomiting)    "no N/V with patch"  . Postprocedural state 11/26/2015  . 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[place]) 12/07/2015  . Restless leg 12/07/2015  . Restless leg syndrome   . 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   Overview:  2017: dx  . S/P lumbar laminectomy 11/26/2015  . S/P lumbar spinal fusion 08/29/2016  . Sleep apnea    mild case  no cpap  . Tinnitus 12/05/2016  . Type 2 diabetes mellitus ([place])   . Type 2 diabetes mellitus without complication, with long-term current use of insulin ([place]) 03/14/2016   Overview:  Managed ENDO    Past Surgical History:  Procedure Laterality Date  . ABDOMINAL HYSTERECTOMY  1983  . APPENDECTOMY  Age 39  . BACK SURGERY     FIRST LUMBAR FUSION/ SURGERY APRIL 2012 AND FUSION WITH INSTRUMENTATION SEPT 2012  . CARDIAC CATHETERIZATION     x 2  . CARPAL TUNNEL RELEASE     bil  . CHOLECYSTECTOMY  1990's  . CYSTO EXTRACTION KIDNEY STONES    . EXCISION/RELEASE BURSA HIP  02/02/2012   Procedure: EXCISION/RELEASE BURSA HIP;  Surgeon: Tobi Bastos, MD;  Location: [facility];  Service: Orthopedics;  Laterality: Right;  Right Hip Bursectomy  . EYE SURGERY Bilateral    cataracts  . KIDNEY STONE SURGERY  2008    . Left knee surgery x 2  1996   reconstruction  . LUMBAR DISC SURGERY  08/2016  . LUMBAR LAMINECTOMY/DECOMPRESSION MICRODISCECTOMY Right 11/26/2015   Procedure: Extraforaminal Microdiscectomy  - Lumbar two-three- right;  Surgeon: Eustace Moore, MD;  Location: [facility];  Service: Neurosurgery;  Laterality: Right;  right   . LUMBAR WOUND DEBRIDEMENT N/A 10/25/2016   Procedure: Lumbar wound revision;  Surgeon: Eustace Moore, MD;  Location: [facility];  Service: Neurosurgery;  Laterality: N/A;  Lumbar wound revision  . Right shoulder surgery  2010   spur    Current  Medications: Current Meds  Medication Sig  . aspirin 81 MG tablet Take 81 mg by mouth at bedtime.   . carvedilol (COREG) 6.25 MG tablet Take 6.25 mg by mouth 2 (two) times daily.   . DULoxetine (CYMBALTA) 60 MG capsule Take 60 mg by mouth at bedtime.   . furosemide (LASIX) 20 MG tablet Take 40 mg by mouth daily.   . insulin NPH-regular Human (NOVOLIN 70/30) (70-30) 100 UNIT/ML injection Inject 35 Units into the skin 2 (two) times daily with a meal.  . mirtazapine (REMERON) 15 MG tablet Take 15 mg by mouth at bedtime.  . nitroGLYCERIN (NITROSTAT) 0.4 MG SL tablet Place 0.4 mg under the tongue every 5 (five) minutes as needed for chest pain.    Current Facility-Administered Medications for the 08/21/18 encounter (Office Visit) with Richardo Priest, MD  Medication  . triamcinolone acetonide (KENALOG) 10 MG/ML injection 10 mg  . triamcinolone acetonide (KENALOG) 10 MG/ML injection 10 mg     Allergies:   Atorvastatin; Talwin [pentazocine]; and Other   Social History   Socioeconomic History  . Marital status: Married    Spouse name: Not on file  . Number of children: Not on file  . Years of education: Not on file  . Highest education level: Not on file  Occupational History  . Occupation: Archivist  Social Needs  . Financial resource strain: Not on file  . Food insecurity:    Worry: Not on file     Inability: Not on file  . Transportation needs:    Medical: Not on file    Non-medical: Not on file  Tobacco Use  . Smoking status: Never Smoker  . Smokeless tobacco: Never Used  Substance and Sexual Activity  . Alcohol use: Yes    Alcohol/week: 0.0 standard drinks    Comment: Rarely  . Drug use: No  . Sexual activity: Not on file  Lifestyle  . Physical activity:    Days per week: Not on file    Minutes per session: Not on file  . Stress: Not on file  Relationships  . Social connections:    Talks on phone: Not on file    Gets together: Not on file    Attends religious service: Not on file    Active member of club or organization: Not on file    Attends meetings of clubs or organizations: Not on file    Relationship status: Not on file  Other Topics Concern  . Not on file  Social History Narrative  . Not on file     Family History: The patient's family history includes Asthma in her sister; Bone cancer in her sister; Heart failure in her mother; Hypertension in her father; Lung cancer in her father; Stroke in her father. ROS:   Please see the history of present illness.    All other systems reviewed and are negative.  EKGs/Labs/Other Studies Reviewed:    The following studies were reviewed today:    Recent Labs: 05/07/2018: BUN 28; Creatinine, Ser 1.25; NT-Pro BNP 43; Potassium 4.4; Sodium 141  Recent Lipid Panel No results found for: CHOL, TRIG, HDL, CHOLHDL, VLDL, LDLCALC, LDLDIRECT  Physical Exam:    VS:  BP 110/70 (BP Location: Left Arm, Patient Position: Sitting, Cuff Size: Large)   Pulse 73   Ht 5\' 2"  (1.575 m)   Wt 210 lb (95.3 kg)   SpO2 96%   BMI 38.41 kg/m     Wt Readings from Last  3 Encounters:  08/21/18 210 lb (95.3 kg)  05/07/18 215 lb (97.5 kg)  08/20/17 211 lb (95.7 kg)     GEN:  Well nourished, well developed in no acute distress HEENT: Normal NECK: No JVD; No carotid bruits LYMPHATICS: No lymphadenopathy CARDIAC: RRR, no murmurs,  rubs, gallops RESPIRATORY:  Clear to auscultation without rales, wheezing or rhonchi  ABDOMEN: Soft, non-tender, non-distended MUSCULOSKELETAL:  No edema; No deformity  SKIN: Warm and dry NEUROLOGIC:  Alert and oriented x 3 PSYCHIATRIC:  Normal affect    Signed, Shirlee More, MD  08/21/2018 2:22 PM    [facility]

## 2018-08-21 NOTE — Patient Instructions (Signed)
Medication Instructions:  Your physician recommends that you continue on your current medications as directed. Please refer to the Current Medication list given to you today.  If you need a refill on your cardiac medications before your next appointment, please call your pharmacy.   Lab work: None  If you have labs (blood work) drawn today and your tests are completely normal, you will receive your results only by: . MyChart Message (if you have MyChart) OR . A paper copy in the mail If you have any lab test that is abnormal or we need to change your treatment, we will call you to review the results.  Testing/Procedures: None  Follow-Up: At [facility], you and your health needs are our priority.  As part of our continuing mission to provide you with exceptional heart care, we have created designated Provider Care Teams.  These Care Teams include your primary Cardiologist (physician) and Advanced Practice Providers (APPs -  Physician Assistants and Nurse Practitioners) who all work together to provide you with the care you need, when you need it. You will need a follow up appointment in 6 months.  Please call our office 2 months in advance to schedule this appointment.  

## 2018-09-04 ENCOUNTER — Encounter: Payer: Self-pay | Admitting: Sports Medicine

## 2018-09-04 ENCOUNTER — Ambulatory Visit (INDEPENDENT_AMBULATORY_CARE_PROVIDER_SITE_OTHER): Payer: PPO | Admitting: Sports Medicine

## 2018-09-04 DIAGNOSIS — M25571 Pain in right ankle and joints of right foot: Secondary | ICD-10-CM

## 2018-09-04 DIAGNOSIS — M79671 Pain in right foot: Secondary | ICD-10-CM

## 2018-09-04 DIAGNOSIS — M779 Enthesopathy, unspecified: Secondary | ICD-10-CM | POA: Diagnosis not present

## 2018-09-04 NOTE — Progress Notes (Signed)
Subjective: Katelyn [name] is a 62 y.o. diabetic female patient who presents to office for return to office for follow-up evaluation of right foot pain.  Patient reports that her pain is much better after getting a steroid shot to the area pain is now 2 out of 10 and states that she notices a big difference with the fascial brace and feels like it helps to give her foot and ankle more stability and is requesting a brace for the left side as well.  Patient has been icing and taking Aleve with no recurrence or worsening in symptoms.  Patient denies warmth redness swelling bruising or any other acute symptoms at the area of concern.  Patient is diabetic with last A1c recorded at 7.9.  Patient Active Problem List   Diagnosis Date Noted  . Chronic diastolic heart failure ([place]) 08/20/2017  . Genetic testing 08/17/2017  . Family history of ovarian cancer 08/17/2017  . Family history of breast cancer 08/17/2017  . Cough 04/19/2017  . DOE (dyspnea on exertion) 04/19/2017  . Hypertensive heart disease with heart failure ([place]) 01/01/2017  . Hyperlipemia 01/01/2017  . LBBB (left bundle branch block) 01/01/2017  . Elevated liver enzymes 12/05/2016  . Meralgia paraesthetica 12/05/2016  . Tinnitus 12/05/2016  . S/P lumbar spinal fusion 08/29/2016  . OSA (obstructive sleep apnea) 05/24/2016  . 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  . Chronic low back pain without sciatica 03/14/2016  . GERD (gastroesophageal reflux disease) 03/14/2016  . Gout 03/14/2016  . Iron deficiency anemia due to chronic blood loss 03/14/2016  . Type 2 diabetes mellitus without complication, with long-term current use of insulin ([place]) 03/14/2016  . Cannot sleep 12/07/2015  . 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[place]) 12/07/2015  . Restless leg 12/07/2015  . S/P lumbar laminectomy 11/26/2015  . Postprocedural state 11/26/2015  . Mild CAD 11/24/2015  . Greater trochanteric bursitis of right hip 02/02/2012  . Iliotibial band  syndrome of right side 02/02/2012  . Iliotibial band syndrome 02/02/2012  . Bursitis, trochanteric 02/02/2012    Current Outpatient Medications on File Prior to Visit  Medication Sig Dispense Refill  . aspirin 81 MG tablet Take 81 mg by mouth at bedtime.     . carvedilol (COREG) 6.25 MG tablet Take 6.25 mg by mouth 2 (two) times daily.     . DULoxetine (CYMBALTA) 60 MG capsule Take 60 mg by mouth at bedtime.     . furosemide (LASIX) 20 MG tablet Take 40 mg by mouth daily.     . insulin NPH-regular Human (NOVOLIN 70/30) (70-30) 100 UNIT/ML injection Inject 35 Units into the skin 2 (two) times daily with a meal.    . mirtazapine (REMERON) 15 MG tablet Take 15 mg by mouth at bedtime.    . nitroGLYCERIN (NITROSTAT) 0.4 MG SL tablet Place 0.4 mg under the tongue every 5 (five) minutes as needed for chest pain.      Current Facility-Administered Medications on File Prior to Visit  Medication Dose Route Frequency Provider Last Rate Last Dose  . triamcinolone acetonide (KENALOG) 10 MG/ML injection 10 mg  10 mg Other Once Landis Martins, DPM      . triamcinolone acetonide (KENALOG) 10 MG/ML injection 10 mg  10 mg Other Once Landis Martins, DPM        Allergies  Allergen Reactions  . Atorvastatin Nausea And Vomiting and Other (See Comments)    MYALGIAS  . Talwin [Pentazocine] Other (See Comments)    headache  . Other Nausea Only  UNSPECIFIED Anesthesia    Objective:  General: Alert and oriented x3 in no acute distress  Dermatology: No open lesions bilateral lower extremities, no webspace macerations, no ecchymosis bilateral, all nails x 10 are well manicured.   Vascular: Dorsalis Pedis and Posterior Tibial pedal pulses palpable, Capillary Fill Time 3 seconds,(+) pedal hair growth bilateral, no edema bilateral lower extremities, Temperature gradient within normal limits.  Neurology: Johney Maine sensation intact via light touch bilateral, (- )Tinels sign bilateral.   Musculoskeletal: No  tenderness with palpation at right dorsal lateral foot over the sinus tarsi and there is no pain with range of motion especially inversion eversion of foot.  There is no pain with end range of stretching especially at the Achilles on the right.  No other symptoms noted.   Assessment and Plan: Problem List Items Addressed This Visit    None    Visit Diagnoses    Sinus tarsi syndrome, right    -  Primary   Right foot pain       Tendonitis       Capsulitis          -Complete examination performed -Discussed long-term care for tendinitis and sinus tarsi syndrome -Advised patient to continue with fascial brace on the right for 1 month and then may slowly wean as instructed also dispense precautionary per patient request a fascial brace for the left foot as well -Advised patient to continue with good supportive shoes daily and if pain worsens to return to office -May continue with rest ice elevation protection and Aleve as needed -Patient to return to office as needed or sooner if condition worsens.  Landis Martins, DPM

## 2018-09-17 DIAGNOSIS — M25561 Pain in right knee: Secondary | ICD-10-CM | POA: Diagnosis not present

## 2018-09-19 DIAGNOSIS — M25561 Pain in right knee: Secondary | ICD-10-CM | POA: Diagnosis not present

## 2018-09-24 DIAGNOSIS — M1711 Unilateral primary osteoarthritis, right knee: Secondary | ICD-10-CM | POA: Diagnosis not present

## 2018-09-24 DIAGNOSIS — M25561 Pain in right knee: Secondary | ICD-10-CM | POA: Diagnosis not present

## 2018-10-11 DIAGNOSIS — Z803 Family history of malignant neoplasm of breast: Secondary | ICD-10-CM | POA: Diagnosis not present

## 2018-10-11 DIAGNOSIS — Z1231 Encounter for screening mammogram for malignant neoplasm of breast: Secondary | ICD-10-CM | POA: Diagnosis not present

## 2018-10-17 DIAGNOSIS — M1712 Unilateral primary osteoarthritis, left knee: Secondary | ICD-10-CM | POA: Diagnosis not present

## 2018-10-23 DIAGNOSIS — E669 Obesity, unspecified: Secondary | ICD-10-CM | POA: Diagnosis not present

## 2018-10-23 DIAGNOSIS — Z794 Long term (current) use of insulin: Secondary | ICD-10-CM | POA: Diagnosis not present

## 2018-10-23 DIAGNOSIS — N183 Chronic kidney disease, stage 3 (moderate): Secondary | ICD-10-CM | POA: Diagnosis not present

## 2018-10-23 DIAGNOSIS — I5032 Chronic diastolic (congestive) heart failure: Secondary | ICD-10-CM | POA: Diagnosis not present

## 2018-10-23 DIAGNOSIS — I2510 Atherosclerotic heart disease of native coronary artery without angina pectoris: Secondary | ICD-10-CM | POA: Diagnosis not present

## 2018-10-23 DIAGNOSIS — E1140 Type 2 diabetes mellitus with diabetic neuropathy, unspecified: Secondary | ICD-10-CM | POA: Diagnosis not present

## 2018-10-23 DIAGNOSIS — R945 Abnormal results of liver function studies: Secondary | ICD-10-CM | POA: Diagnosis not present

## 2018-10-23 DIAGNOSIS — I10 Essential (primary) hypertension: Secondary | ICD-10-CM | POA: Diagnosis not present

## 2018-10-23 DIAGNOSIS — F329 Major depressive disorder, single episode, unspecified: Secondary | ICD-10-CM | POA: Diagnosis not present

## 2018-10-23 DIAGNOSIS — M25561 Pain in right knee: Secondary | ICD-10-CM | POA: Diagnosis not present

## 2018-10-23 DIAGNOSIS — E7849 Other hyperlipidemia: Secondary | ICD-10-CM | POA: Diagnosis not present

## 2018-10-23 DIAGNOSIS — Z6837 Body mass index (BMI) 37.0-37.9, adult: Secondary | ICD-10-CM | POA: Diagnosis not present

## 2018-10-23 DIAGNOSIS — D509 Iron deficiency anemia, unspecified: Secondary | ICD-10-CM | POA: Diagnosis not present

## 2018-12-20 DIAGNOSIS — I10 Essential (primary) hypertension: Secondary | ICD-10-CM | POA: Diagnosis not present

## 2018-12-20 DIAGNOSIS — G2581 Restless legs syndrome: Secondary | ICD-10-CM | POA: Diagnosis not present

## 2018-12-20 DIAGNOSIS — F3340 Major depressive disorder, recurrent, in remission, unspecified: Secondary | ICD-10-CM | POA: Diagnosis not present

## 2019-02-13 ENCOUNTER — Encounter: Payer: Self-pay | Admitting: Sports Medicine

## 2019-02-13 ENCOUNTER — Other Ambulatory Visit: Payer: Self-pay

## 2019-02-13 ENCOUNTER — Ambulatory Visit (INDEPENDENT_AMBULATORY_CARE_PROVIDER_SITE_OTHER): Payer: PPO

## 2019-02-13 ENCOUNTER — Ambulatory Visit: Payer: PPO | Admitting: Sports Medicine

## 2019-02-13 VITALS — Temp 98.3°F | Resp 16

## 2019-02-13 DIAGNOSIS — M79672 Pain in left foot: Secondary | ICD-10-CM

## 2019-02-13 DIAGNOSIS — M779 Enthesopathy, unspecified: Secondary | ICD-10-CM

## 2019-02-13 DIAGNOSIS — M79671 Pain in right foot: Secondary | ICD-10-CM | POA: Diagnosis not present

## 2019-02-13 MED ORDER — TRIAMCINOLONE ACETONIDE 10 MG/ML IJ SUSP
10.0000 mg | Freq: Once | INTRAMUSCULAR | Status: AC
  Administered 2019-02-13: 10 mg

## 2019-02-13 NOTE — Progress Notes (Signed)
Subjective: Katelyn [name] is a 63 y.o. female patient who presents to office for evaluation of right foot pain. Patient complains of progressive pain especially over the last few days since Monday states that slowly the pain started getting worse and experienced very sharp pain at the bottom of the right foot does not recall any type of injury pain is worse with walking reports that she has tried icing gabapentin and she took her gabapentin today as well the pain seems to be somewhat better however still hurts with pressure patient reports that her other pain along the side of her foot and tendon seems to be doing much better has been using brace for this area and has been doing gentle stretching.  Patient is diabetic and reports that her blood sugar this morning was 160 and her last A1c 7.9.  Patient denies any other pedal complaints or any other causative factors at this time.    Patient Active Problem List   Diagnosis Date Noted  . Chronic diastolic heart failure ([place]) 08/20/2017  . Genetic testing 08/17/2017  . Family history of ovarian cancer 08/17/2017  . Family history of breast cancer 08/17/2017  . Cough 04/19/2017  . DOE (dyspnea on exertion) 04/19/2017  . Hypertensive heart disease with heart failure ([place]) 01/01/2017  . Hyperlipemia 01/01/2017  . LBBB (left bundle branch block) 01/01/2017  . Elevated liver enzymes 12/05/2016  . Meralgia paraesthetica 12/05/2016  . Tinnitus 12/05/2016  . S/P lumbar spinal fusion 08/29/2016  . OSA (obstructive sleep apnea) 05/24/2016  . 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  . Chronic low back pain without sciatica 03/14/2016  . GERD (gastroesophageal reflux disease) 03/14/2016  . Gout 03/14/2016  . Iron deficiency anemia due to chronic blood loss 03/14/2016  . Type 2 diabetes mellitus without complication, with long-term current use of insulin ([place]) 03/14/2016  . Cannot sleep 12/07/2015  . 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[place]) 12/07/2015  .  Restless leg 12/07/2015  . S/P lumbar laminectomy 11/26/2015  . Postprocedural state 11/26/2015  . Mild CAD 11/24/2015  . Greater trochanteric bursitis of right hip 02/02/2012  . Iliotibial band syndrome of right side 02/02/2012  . Iliotibial band syndrome 02/02/2012  . Bursitis, trochanteric 02/02/2012    Current Outpatient Medications on File Prior to Visit  Medication Sig Dispense Refill  . aspirin 81 MG tablet Take 81 mg by mouth at bedtime.     . carvedilol (COREG) 6.25 MG tablet Take 6.25 mg by mouth 2 (two) times daily.     . DULoxetine (CYMBALTA) 60 MG capsule Take 60 mg by mouth at bedtime.     . furosemide (LASIX) 20 MG tablet Take 40 mg by mouth daily.     . insulin NPH-regular Human (NOVOLIN 70/30) (70-30) 100 UNIT/ML injection Inject 35 Units into the skin 2 (two) times daily with a meal.    . mirtazapine (REMERON) 15 MG tablet Take 15 mg by mouth at bedtime.    . nitroGLYCERIN (NITROSTAT) 0.4 MG SL tablet Place 0.4 mg under the tongue every 5 (five) minutes as needed for chest pain.     Glory Rosebush VERIO test strip USE 1 STRIP TO CHECK GLUCOSE 4 TIMES DAILY AS DIRECTED    . pantoprazole (PROTONIX) 40 MG tablet Take 40 mg by mouth daily.    Marland Kitchen torsemide (DEMADEX) 20 MG tablet      Current Facility-Administered Medications on File Prior to Visit  Medication Dose Route Frequency Provider Last Rate Last Dose  . triamcinolone acetonide (  KENALOG) 10 MG/ML injection 10 mg  10 mg Other Once Landis Martins, DPM      . triamcinolone acetonide (KENALOG) 10 MG/ML injection 10 mg  10 mg Other Once Landis Martins, DPM        Allergies  Allergen Reactions  . Atorvastatin Nausea And Vomiting and Other (See Comments)    MYALGIAS  . Talwin [Pentazocine] Other (See Comments)    headache  . Other Nausea Only    UNSPECIFIED Anesthesia    Objective:  General: Alert and oriented x3 in no acute distress  Dermatology: No open lesions bilateral lower extremities, no webspace  macerations, no ecchymosis bilateral, all nails x 10 are well manicured.  Vascular: Dorsalis Pedis and Posterior Tibial pedal pulses palpable, Capillary Fill Time 3 seconds,(+) pedal hair growth bilateral, no edema bilateral lower extremities, Temperature gradient within normal limits.  Neurology: Johney Maine sensation intact via light touch bilateral.  Musculoskeletal: Mild tenderness with palpation at plantar second metatarsal phalangeal joint right greater than left.  No instability noted of the second metatarsal phalangeal joint however on the right foot this is the area of maximal pain with very early digital contractures greater than the left foot.  No reproducible tenderness over the right dorsal lateral foot and sinus tarsi like before. Strength within normal limits.  Gait: Antalgic gait  Xrays  Right foot   Impression: Normal osseous mineralization there are no acute findings especially at the area of concern at the metatarsal heads or plantar forefoot.  Mild digital contracture versus early pre-dislocation/hammertoe syndrome at second metatarsal phalangeal joint.  Assessment and Plan: Problem List Items Addressed This Visit    None    Visit Diagnoses    Capsulitis    -  Primary   Relevant Medications   triamcinolone acetonide (KENALOG) 10 MG/ML injection 10 mg (Completed) (Start on 02/13/2019  9:15 AM)   Other Relevant Orders   DG Foot 2 Views Right   Right foot pain       Relevant Medications   triamcinolone acetonide (KENALOG) 10 MG/ML injection 10 mg (Completed) (Start on 02/13/2019  9:15 AM)   Other Relevant Orders   DG Foot 2 Views Right   Left foot pain           -Complete examination performed -Xrays reviewed -Discussed treatement options for capsulitis right greater than left plantar second metatarsal head -After oral consent and aseptic prep, injected a mixture containing 1 ml of 2%  plain lidocaine, 1 ml 0.5% plain marcaine, 0.5 ml of kenalog 10 and 0.5 ml of  dexamethasone phosphate into plantar second metatarsal phalangeal joint on right without complication. Post-injection care discussed with patient.  -Rx removable metatarsal sleeves bilateral -Advised patient to continue with good supportive shoes and to avoid activities that could aggravate the balls of both feet -Patient to return to office as needed or sooner if condition worsens or if not resolved after 3 weeks.  Landis Martins, DPM

## 2019-03-12 DIAGNOSIS — Z79899 Other long term (current) drug therapy: Secondary | ICD-10-CM | POA: Diagnosis not present

## 2019-03-12 DIAGNOSIS — R1030 Lower abdominal pain, unspecified: Secondary | ICD-10-CM | POA: Diagnosis not present

## 2019-03-12 DIAGNOSIS — N952 Postmenopausal atrophic vaginitis: Secondary | ICD-10-CM | POA: Diagnosis not present

## 2019-03-12 DIAGNOSIS — R102 Pelvic and perineal pain: Secondary | ICD-10-CM | POA: Diagnosis not present

## 2019-03-12 DIAGNOSIS — N3946 Mixed incontinence: Secondary | ICD-10-CM | POA: Diagnosis not present

## 2019-03-12 DIAGNOSIS — Z9049 Acquired absence of other specified parts of digestive tract: Secondary | ICD-10-CM | POA: Diagnosis not present

## 2019-03-13 ENCOUNTER — Telehealth: Payer: Self-pay | Admitting: Cardiology

## 2019-03-13 ENCOUNTER — Telehealth: Payer: Self-pay | Admitting: *Deleted

## 2019-03-13 NOTE — Telephone Encounter (Signed)
Virtual Visit Pre-Appointment Phone Call  "(Name), I am calling you today to discuss your upcoming appointment. We are currently trying to limit exposure to the virus that causes COVID-19 by seeing patients at home rather than in the office."  1. "What is the BEST phone number to call the day of the visit?" - include this in appointment notes  2. Do you have or have access to (through a family member/friend) a smartphone with video capability that we can use for your visit?" a. If yes - list this number in appt notes as cell (if different from BEST phone #) and list the appointment type as a VIDEO visit in appointment notes b. If no - list the appointment type as a PHONE visit in appointment notes  3. Confirm consent - "In the setting of the current Covid19 crisis, you are scheduled for a (phone or video) visit with your provider on (date) at (time).  Just as we do with many in-office visits, in order for you to participate in this visit, we must obtain consent.  If you'd like, I can send this to your mychart (if signed up) or email for you to review.  Otherwise, I can obtain your verbal consent now.  All virtual visits are billed to your insurance company just like a normal visit would be.  By agreeing to a virtual visit, we'd like you to understand that the technology does not allow for your provider to perform an examination, and thus may limit your provider's ability to fully assess your condition. If your provider identifies any concerns that need to be evaluated in person, we will make arrangements to do so.  Finally, though the technology is pretty good, we cannot assure that it will always work on either your or our end, and in the setting of a video visit, we may have to convert it to a phone-only visit.  In either situation, we cannot ensure that we have a secure connection.  Are you willing to proceed?" STAFF: Did the patient verbally acknowledge consent to telehealth visit? Document  YES/NO here: YES  4. Advise patient to be prepared - "Two hours prior to your appointment, go ahead and check your blood pressure, pulse, oxygen saturation, and your weight (if you have the equipment to check those) and write them all down. When your visit starts, your provider will ask you for this information. If you have an Apple Watch or Kardia device, please plan to have heart rate information ready on the day of your appointment. Please have a pen and paper handy nearby the day of the visit as well."  5. Give patient instructions for MyChart download to smartphone OR [email or web address] as below if video visit (depending on what platform provider is using)  6. Inform patient they will receive a phone call 15 minutes prior to their appointment time (may be from unknown caller ID) so they should be prepared to answer    TELEPHONE CALL NOTE  Katelyn [name] has been deemed a candidate for a follow-up tele-health visit to limit community exposure during the Covid-19 pandemic. I spoke with the patient via phone to ensure availability of phone/video source, confirm preferred email & phone number, and discuss instructions and expectations.  I reminded Katelyn [name] to be prepared with any vital sign and/or heart rhythm information that could potentially be obtained via home monitoring, at the time of her visit. I reminded Katelyn [name] to expect a phone call prior to  her visit.  Frederic Jericho 03/13/2019 9:16 AM   INSTRUCTIONS FOR DOWNLOADING THE MYCHART APP TO SMARTPHONE  - The patient must first make sure to have activated MyChart and know their login information - If Apple, go to [employer] and type in MyChart in the search bar and download the app. If Android, ask patient to go to [employer] and type in [place] in the search bar and download the app. The app is free but as with any other app downloads, their phone may require them to verify saved payment information or Apple/Android password.  - The  patient will need to then log into the app with their MyChart username and password, and select [facility] as their healthcare provider to link the account. When it is time for your visit, go to the MyChart app, find appointments, and click Begin Video Visit. Be sure to Select Allow for your device to access the Microphone and Camera for your visit. You will then be connected, and your provider will be with you shortly.  **If they have any issues connecting, or need assistance please contact MyChart service desk [phone])**  **If using a computer, in order to ensure the best quality for their visit they will need to use either of the following Internet Browsers: [employer], or Google Chrome**  IF USING DOXIMITY or DOXY.ME - The patient will receive a link just prior to their visit by text.     FULL LENGTH CONSENT FOR TELE-HEALTH VISIT   I hereby voluntarily request, consent and authorize [facility] and its employed or contracted physicians, physician assistants, nurse practitioners or other licensed health care professionals (the Practitioner), to provide me with telemedicine health care services (the Services") as deemed necessary by the treating Practitioner. I acknowledge and consent to receive the Services by the Practitioner via telemedicine. I understand that the telemedicine visit will involve communicating with the Practitioner through live audiovisual communication technology and the disclosure of certain medical information by electronic transmission. I acknowledge that I have been given the opportunity to request an in-person assessment or other available alternative prior to the telemedicine visit and am voluntarily participating in the telemedicine visit.  I understand that I have the right to withhold or withdraw my consent to the use of telemedicine in the course of my care at any time, without affecting my right to future care or treatment, and that the  Practitioner or I may terminate the telemedicine visit at any time. I understand that I have the right to inspect all information obtained and/or recorded in the course of the telemedicine visit and may receive copies of available information for a reasonable fee.  I understand that some of the potential risks of receiving the Services via telemedicine include:   Delay or interruption in medical evaluation due to technological equipment failure or disruption;  Information transmitted may not be sufficient (e.g. poor resolution of images) to allow for appropriate medical decision making by the Practitioner; and/or   In rare instances, security protocols could fail, causing a breach of personal health information.  Furthermore, I acknowledge that it is my responsibility to provide information about my medical history, conditions and care that is complete and accurate to the best of my ability. I acknowledge that Practitioner's advice, recommendations, and/or decision may be based on factors not within their control, such as incomplete or inaccurate data provided by me or distortions of diagnostic images or specimens that may result from electronic transmissions. I  understand that the practice of medicine is not an exact science and that Practitioner makes no warranties or guarantees regarding treatment outcomes. I acknowledge that I will receive a copy of this consent concurrently upon execution via email to the email address I last provided but may also request a printed copy by calling the office of [facility].    I understand that my insurance will be billed for this visit.   I have read or had this consent read to me.  I understand the contents of this consent, which adequately explains the benefits and risks of the Services being provided via telemedicine.   I have been provided ample opportunity to ask questions regarding this consent and the Services and have had my questions answered to my  satisfaction.  I give my informed consent for the services to be provided through the use of telemedicine in my medical care  By participating in this telemedicine visit I agree to the above.

## 2019-03-13 NOTE — Telephone Encounter (Signed)
Patient was scheduled for virtual visit with Dr Bettina Gavia on 03-14-2019.  Patient aware to report to [facility] at scheduled time to see Dr Bettina Gavia in office.  Patient agreed to plan and verbalized understanding.

## 2019-03-13 NOTE — Telephone Encounter (Signed)
OK early next week office visit

## 2019-03-13 NOTE — Telephone Encounter (Signed)
Pt feels she needs to be seen in the office. 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 Has edema in legs, tired, no energy, shortness of breath and not sleeping. Please advise.

## 2019-03-14 ENCOUNTER — Ambulatory Visit (INDEPENDENT_AMBULATORY_CARE_PROVIDER_SITE_OTHER): Payer: PPO | Admitting: Cardiology

## 2019-03-14 ENCOUNTER — Encounter: Payer: Self-pay | Admitting: *Deleted

## 2019-03-14 ENCOUNTER — Other Ambulatory Visit: Payer: Self-pay

## 2019-03-14 ENCOUNTER — Encounter: Payer: Self-pay | Admitting: Cardiology

## 2019-03-14 VITALS — BP 120/72 | HR 80 | Temp 97.0°F | Ht 62.0 in | Wt 215.8 lb

## 2019-03-14 DIAGNOSIS — I447 Left bundle-branch block, unspecified: Secondary | ICD-10-CM

## 2019-03-14 DIAGNOSIS — I5032 Chronic diastolic (congestive) heart failure: Secondary | ICD-10-CM

## 2019-03-14 DIAGNOSIS — R079 Chest pain, unspecified: Secondary | ICD-10-CM

## 2019-03-14 DIAGNOSIS — I110 Hypertensive heart disease with heart failure: Secondary | ICD-10-CM

## 2019-03-14 DIAGNOSIS — I2510 Atherosclerotic heart disease of native coronary artery without angina pectoris: Secondary | ICD-10-CM | POA: Diagnosis not present

## 2019-03-14 NOTE — Patient Instructions (Signed)
Medication Instructions:  Your physician recommends that you continue on your current medications as directed. Please refer to the Current Medication list given to you today.  If you need a refill on your cardiac medications before your next appointment, please call your pharmacy.   Lab work: Your physician recommends that you return for lab work in: TODAY  CMP,ProBNP  If you have labs (blood work) drawn today and your tests are completely normal, you will receive your results only by: Marland Kitchen MyChart Message (if you have MyChart) OR . A paper copy in the mail If you have any lab test that is abnormal or we need to change your treatment, we will call you to review the results.  Testing/Procedures: Your physician has requested that you have an echocardiogram. Echocardiography is a painless test that uses sound waves to create images of your heart. It provides your doctor with information about the size and shape of your heart and how well your heart's chambers and valves are working. This procedure takes approximately one hour. There are no restrictions for this procedure.  Your physician has requested that you have a lexiscan myoview. For further information please visit [email or web address]. Please follow instruction sheet, as given.    Follow-Up: At [facility], you and your health needs are our priority.  As part of our continuing mission to provide you with exceptional heart care, we have created designated Provider Care Teams.  These Care Teams include your primary Cardiologist (physician) and Advanced Practice Providers (APPs -  Physician Assistants and Nurse Practitioners) who all work together to provide you with the care you need, when you need it. You will need a follow up appointment in 8 weeks.   Any Other Special Instructions Will Be Listed Below (If Applicable).

## 2019-03-14 NOTE — Progress Notes (Signed)
Cardiology Office Note:    Date:  03/14/2019   ID:  Katelyn [name], DOB [birth year], MRN [id number]  PCP:  Algis Greenhouse, MD  Cardiologist:  Shirlee More, MD    Referring MD: Algis Greenhouse, MD    ASSESSMENT:    1. Chronic diastolic heart failure ([place])   2. Hypertensive heart disease with heart failure ([place])   3. Mild CAD   4. Chest pain, unspecified type   5. LBBB (left bundle branch block)    PLAN:    In order of problems listed above:  1. Chest pain - Reports chest discomfort associated with SOB when walking to the mailbox or around the house. SOB with etiology of CAD, decompensated heart failure, related to her lung disease, or body habitus. Does not radiate. Describes as an "aching" sort of pain. She is very worried about the minor blockage found via cardiac cath in 2014. Chest pain has typical features for CAD, will plan for Myoview.   Myoview 2. Mild CAD - Reports chest pain, as above. LHC 2018 mild disease to mid circumflex. Low risk Myoview 02/2017. Plan repeat Myoview.  At this time I do not think she needs an ischemia heart catheterization there is no evidence of acute coronary syndrome 3. Chronic diastolic heart failure - Echo 01/2017 with LVH, EF 55-60%, and grade 1 diastolic dysfunction. Reports edema in her bilateral hands and legs. Non-pitting on exam. Reports weight gain of approximately 15lbs over the last 1-2 months.   Try to stop gabapentin as it promotes Na and fluid retention.  I suspect gabapentin is the predominant problem causing edema and pseudo-heart failure  Echocardiogram  ProBNP, CMP today.  Titrate diuretics based on labs today. Continue current Torsemide in the interim. 4. Hypertensive heart disease with heart failure - Encouraged her to do so. BP stable today 120/72. Continue current anti-hypertensive regimen. See above for plan.  5. LBBB - Chronic, present on EKG today. No syncope.   Next appointment: 6-8 weeks.   Medication Adjustments/Labs  and Tests Ordered: Current medicines are reviewed at length with the patient today.  Concerns regarding medicines are outlined above.  Orders Placed This Encounter  Procedures  . Comprehensive Metabolic Panel (CMET)  . Pro b natriuretic peptide (BNP)[facility])  . MYOCARDIAL PERFUSION IMAGING  . EKG 12-Lead  . ECHOCARDIOGRAM COMPLETE   No orders of the defined types were placed in this encounter.   No chief complaint on file.   History of Present Illness:    Katelyn [name] is a 63 y.o. female with a hx of hypertension mild CAD dyslipidemia left bundle branch block with an ejection fraction of 55 to 60% restrictive lung disease with shortness of breath due to deconditioning and obesity last seen 08/21/18.  She called and requested to be seen because of edema  Echo 01/2017: mild concentric LVH, EF 99-24%, grade I diastolic dysfunction, left atrial cavity mildly dilated.  02/2017 Lexiscan: No evidence of ischemia, EF 46%.   Chief complaint today of edema in hands and legs and dyspnea on exertion. Reports taking increased dose of torsemide for 3-4 weeks without improvement of edema. Takes gabapentin which may be exacerbating the issue.   Compliance with diet, lifestyle and medications: Yes Past Medical History:  Diagnosis Date  . Anemia    previous transfusion 2/17  . Angina pectoris ([place]) 11/24/2015  . Anxiety   . Arthritis   . Benign hypertension 03/14/2016  . Bursitis of right hip   .  CAD (coronary artery disease)    Cardiac catheterization June 2014 in [facility] - 50% circumflex stenosis  . CAD in native artery 11/24/2015  . Cannot sleep 12/07/2015  . Chronic kidney disease    does not see nephrologist  . Chronic low back pain without sciatica 03/14/2016  . Complication of anesthesia   . Cough 04/19/2017   Overview:  Last Assessment & Plan:  Formatting of this note may be different from the original. Cough - ? ACE related with AR triggers   Plan  Patient  Instructions  Discuss with your primary doctor that lisinopril pain, need making your cough worse. May use Mucinex DM twice daily as needed for cough and congestion Zyrtec 10 mg at bedtime as needed for drainage Saline nasal spray as needed. Lab tests today Activity as tolerated. Follow with Dr. [name] in 3-4 months and As needed   Please contact office for sooner follow up if symptoms do not improve or worsen or seek emergency care   . Depression   . DOE (dyspnea on exertion) 04/19/2017   Overview:  2017:   Last Assessment & Plan:  Formatting of this note may be different from the original. DOE ? Etiology , felt to be component of deconditioning  Workup has been unrevealing with neg VQ scan , PFT and CXR , reported neg card w/up  Would cont exrecise regimen as tolerate  Plan  Patient Instructions  Discuss with your primary doctor that lisinopril pain, need making your cough worse. May use Mucinex DM twice daily as needed for cough and congestion Zyrtec 10 mg at bedtime as needed for drainage Saline nasal spray as needed. Lab tests today Activity as tolerated. Follow with Dr. [name] in 3-4 months and As needed   Please contact office for sooner follow up if symptoms do not improve or worsen or seek emergency care   . Dyspnea    with exertion   " lazy lung" - per  [name] from back issues- 06/2016  . Elevated liver enzymes 12/05/2016  . Essential hypertension   . GERD (gastroesophageal reflux disease)   . Gout 03/14/2016  . Greater trochanteric bursitis of right hip 02/02/2012  . H/O hiatal hernia   . Heart murmur   . History of blood transfusion 2016  . History of kidney stones   . Hypercholesterolemia   . Hyperlipemia 01/01/2017  . Iliotibial band syndrome of right side 02/02/2012  . Iron deficiency anemia due to chronic blood loss 03/14/2016  . LBBB (left bundle branch block) 01/01/2017  . Left bundle branch block   . Lumbar stenosis   . Meralgia paraesthetica 12/05/2016  . Neuropathy   . OSA (obstructive  sleep apnea) 05/24/2016   Overview:  Managed PULM  . PONV (postoperative nausea and vomiting)    "no N/V with patch"  . Postprocedural state 11/26/2015  . 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[place]) 12/07/2015  . Restless leg 12/07/2015  . Restless leg syndrome   . 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   Overview:  2017: dx  . S/P lumbar laminectomy 11/26/2015  . S/P lumbar spinal fusion 08/29/2016  . Sleep apnea    mild case  no cpap  . Tinnitus 12/05/2016  . Type 2 diabetes mellitus ([place])   . Type 2 diabetes mellitus without complication, with long-term current use of insulin ([place]) 03/14/2016   Overview:  Managed ENDO    Past Surgical History:  Procedure Laterality Date  . ABDOMINAL HYSTERECTOMY  1983  . APPENDECTOMY  Age 69  . BACK SURGERY     FIRST LUMBAR FUSION/ SURGERY APRIL 2012 AND FUSION WITH INSTRUMENTATION SEPT 2012  . CARDIAC CATHETERIZATION     x 2  . CARPAL TUNNEL RELEASE     bil  . CHOLECYSTECTOMY  1990's  . CYSTO EXTRACTION KIDNEY STONES    . EXCISION/RELEASE BURSA HIP  02/02/2012   Procedure: EXCISION/RELEASE BURSA HIP;  Surgeon: Tobi Bastos, MD;  Location: [facility];  Service: Orthopedics;  Laterality: Right;  Right Hip Bursectomy  . EYE SURGERY Bilateral    cataracts  . KIDNEY STONE SURGERY  2008  . Left knee surgery x 2  1996   reconstruction  . LUMBAR DISC SURGERY  08/2016  . LUMBAR LAMINECTOMY/DECOMPRESSION MICRODISCECTOMY Right 11/26/2015   Procedure: Extraforaminal Microdiscectomy  - Lumbar two-three- right;  Surgeon: Eustace Moore, MD;  Location: [facility];  Service: Neurosurgery;  Laterality: Right;  right   . LUMBAR WOUND DEBRIDEMENT N/A 10/25/2016   Procedure: Lumbar wound revision;  Surgeon: Eustace Moore, MD;  Location: [place];  Service: Neurosurgery;  Laterality: N/A;  Lumbar wound revision  . Right shoulder surgery  2010   spur    Current Medications: Current Meds  Medication Sig  . aspirin 81 MG tablet Take 81 mg by mouth at bedtime.   .  carvedilol (COREG) 6.25 MG tablet Take 6.25 mg by mouth 2 (two) times daily.   . DULoxetine (CYMBALTA) 60 MG capsule Take 60 mg by mouth at bedtime.   . gabapentin (NEURONTIN) 300 MG capsule Take 300 mg by mouth daily.  Marland Kitchen GLUCOSAMINE-CHONDROITIN PO Take 1 tablet by mouth daily.  . insulin NPH-regular Human (NOVOLIN 70/30) (70-30) 100 UNIT/ML injection Inject 100 Units into the skin 3 (three) times daily.   . mirtazapine (REMERON) 15 MG tablet Take 15 mg by mouth at bedtime.  . Multiple Vitamin (MULTIVITAMIN) tablet Take 1 tablet by mouth daily.  . nitroGLYCERIN (NITROSTAT) 0.4 MG SL tablet Place 0.4 mg under the tongue every 5 (five) minutes as needed for chest pain.   Glory Rosebush VERIO test strip USE 1 STRIP TO CHECK GLUCOSE 4 TIMES DAILY AS DIRECTED  . oxybutynin (DITROPAN) 5 MG tablet Take 5 mg by mouth daily.  . pantoprazole (PROTONIX) 40 MG tablet Take 40 mg by mouth daily.  . pramipexole (MIRAPEX) 1 MG tablet Take 1 mg by mouth at bedtime.  . torsemide (DEMADEX) 20 MG tablet Take 40 mg by mouth daily.   . TURMERIC CURCUMIN PO Take 1 tablet by mouth daily.  . valACYclovir (VALTREX) 1000 MG tablet Take 1,000 mg by mouth 2 (two) times daily as needed.   Current Facility-Administered Medications for the 03/14/19 encounter (Office Visit) with Richardo Priest, MD  Medication  . triamcinolone acetonide (KENALOG) 10 MG/ML injection 10 mg  . triamcinolone acetonide (KENALOG) 10 MG/ML injection 10 mg     Allergies:   Atorvastatin; Talwin [pentazocine]; and Other   Social History   Socioeconomic History  . Marital status: Married    Spouse name: Not on file  . Number of children: Not on file  . Years of education: Not on file  . Highest education level: Not on file  Occupational History  . Occupation: Archivist  Social Needs  . Financial resource strain: Not on file  . Food insecurity:    Worry: Not on file    Inability: Not on file  . Transportation needs:     Medical: Not on file  Non-medical: Not on file  Tobacco Use  . Smoking status: Never Smoker  . Smokeless tobacco: Never Used  Substance and Sexual Activity  . Alcohol use: Yes    Alcohol/week: 0.0 standard drinks    Comment: Rarely  . Drug use: No  . Sexual activity: Not on file  Lifestyle  . Physical activity:    Days per week: Not on file    Minutes per session: Not on file  . Stress: Not on file  Relationships  . Social connections:    Talks on phone: Not on file    Gets together: Not on file    Attends religious service: Not on file    Active member of club or organization: Not on file    Attends meetings of clubs or organizations: Not on file    Relationship status: Not on file  Other Topics Concern  . Not on file  Social History Narrative  . Not on file     Family History: The patient's family history includes Asthma in her sister; Bone cancer in her sister; Heart failure in her mother; Hypertension in her father; Lung cancer in her father; Stroke in her father. ROS:   Please see the history of present illness.    All other systems reviewed and are negative.  EKGs/Labs/Other Studies Reviewed:    The following studies were reviewed today:  Echo 01/2017: mild concentric LVH, EF 53-66%, grade I diastolic dysfunction, left atrial cavity mildly dilated.   02/2017 Lexiscan: No evidence of ischemia, EF 46%.   EKG:  EKG ordered today and personally reviewed.  The ekg ordered today demonstrates NSR rate 72 with LBBB. Stable from previous 11/24/15.  Recent Labs: 05/07/2018: BUN 28; Creatinine, Ser 1.25; NT-Pro BNP 43; Potassium 4.4; Sodium 141  Recent Lipid Panel No results found for: CHOL, TRIG, HDL, CHOLHDL, VLDL, LDLCALC, LDLDIRECT  Physical Exam:    VS:  BP 120/72 (BP Location: Right Arm, Patient Position: Sitting, Cuff Size: Large)   Pulse 80   Temp (!) 97 F (36.1 C)   Ht 5\' 2"  (1.575 m)   Wt 215 lb 12.8 oz (97.9 kg)   SpO2 95%   BMI 39.47 kg/m     Wt  Readings from Last 3 Encounters:  03/14/19 215 lb 12.8 oz (97.9 kg)  08/21/18 210 lb (95.3 kg)  05/07/18 215 lb (97.5 kg)     GEN:  Well nourished, well developed in no acute distress HEENT: Normal NECK: No JVD; No carotid bruits LYMPHATICS: No lymphadenopathy CARDIAC: RRR, no murmurs, rubs, gallops RESPIRATORY:  Clear to auscultation without rales, wheezing or rhonchi  ABDOMEN: Soft, non-tender, non-distended MUSCULOSKELETAL:  No deformity. Bilateral lower extremities trace edema. Bilateral hands with trace edema.  SKIN: Warm and dry NEUROLOGIC:  Alert and oriented x 3 PSYCHIATRIC:  Normal affect    Signed, Shirlee More, MD  03/14/2019 4:23 PM    [facility]

## 2019-03-15 LAB — COMPREHENSIVE METABOLIC PANEL
ALT: 117 IU/L — ABNORMAL HIGH (ref 0–32)
AST: 130 IU/L — ABNORMAL HIGH (ref 0–40)
Albumin/Globulin Ratio: 1.3 (ref 1.2–2.2)
Albumin: 4.3 g/dL (ref 3.8–4.8)
Alkaline Phosphatase: 144 IU/L — ABNORMAL HIGH (ref 39–117)
BUN/Creatinine Ratio: 19 (ref 12–28)
BUN: 26 mg/dL (ref 8–27)
Bilirubin Total: 0.5 mg/dL (ref 0.0–1.2)
CO2: 27 mmol/L (ref 20–29)
Calcium: 9.7 mg/dL (ref 8.7–10.3)
Chloride: 98 mmol/L (ref 96–106)
Creatinine, Ser: 1.39 mg/dL — ABNORMAL HIGH (ref 0.57–1.00)
GFR calc Af Amer: 47 mL/min/{1.73_m2} — ABNORMAL LOW (ref >59)
GFR calc non Af Amer: 41 mL/min/{1.73_m2} — ABNORMAL LOW (ref >59)
Globulin, Total: 3.4 g/dL (ref 1.5–4.5)
Glucose: 128 mg/dL — ABNORMAL HIGH (ref 65–99)
Potassium: 4.4 mmol/L (ref 3.5–5.2)
Sodium: 142 mmol/L (ref 134–144)
Total Protein: 7.7 g/dL (ref 6.0–8.5)

## 2019-03-15 LAB — PRO B NATRIURETIC PEPTIDE: NT-Pro BNP: 37 pg/mL (ref 0–287)

## 2019-03-17 ENCOUNTER — Telehealth: Payer: Self-pay | Admitting: *Deleted

## 2019-03-17 ENCOUNTER — Encounter: Payer: Self-pay | Admitting: *Deleted

## 2019-03-17 NOTE — Telephone Encounter (Signed)
-----   Message from Richardo Priest, MD sent at 03/15/2019  7:21 AM EDT ----- Bnp is low does not appear to be heart failure.    Copy to her PCP her liver function is abnormal and not on a statin

## 2019-03-17 NOTE — Telephone Encounter (Signed)
Attempted to contact patient regarding lab results. No  Answer. Left message to call back to discuss.

## 2019-03-17 NOTE — Telephone Encounter (Signed)
Patient called back. Informed of lab results and that we will send them to her primary care MD.

## 2019-03-18 ENCOUNTER — Telehealth: Payer: Self-pay | Admitting: Cardiology

## 2019-03-18 NOTE — Telephone Encounter (Signed)
Patient called and wants to know what it means that her Liver enzymes are elevated, Please call her .Marland Kitchen

## 2019-03-18 NOTE — Telephone Encounter (Signed)
Telephone call to patient . Patient wanted numbers to her liver enzymes which was given to her. Also wanted to know why they are elevated. Informed patient that labs were sent to her primary care MD who should be contacting her abut this labs and following up on the reason why. If they don't contact her she will call them in a few days. No further questions.

## 2019-03-31 DIAGNOSIS — R748 Abnormal levels of other serum enzymes: Secondary | ICD-10-CM | POA: Diagnosis not present

## 2019-03-31 DIAGNOSIS — E119 Type 2 diabetes mellitus without complications: Secondary | ICD-10-CM | POA: Diagnosis not present

## 2019-04-03 DIAGNOSIS — Z0000 Encounter for general adult medical examination without abnormal findings: Secondary | ICD-10-CM | POA: Diagnosis not present

## 2019-04-03 DIAGNOSIS — R748 Abnormal levels of other serum enzymes: Secondary | ICD-10-CM | POA: Diagnosis not present

## 2019-04-04 DIAGNOSIS — R748 Abnormal levels of other serum enzymes: Secondary | ICD-10-CM | POA: Diagnosis not present

## 2019-04-04 DIAGNOSIS — R740 Nonspecific elevation of levels of transaminase and lactic acid dehydrogenase [LDH]: Secondary | ICD-10-CM | POA: Diagnosis not present

## 2019-04-08 ENCOUNTER — Telehealth: Payer: Self-pay | Admitting: *Deleted

## 2019-04-08 DIAGNOSIS — Z87448 Personal history of other diseases of urinary system: Secondary | ICD-10-CM | POA: Insufficient documentation

## 2019-04-08 DIAGNOSIS — N183 Chronic kidney disease, stage 3 (moderate): Secondary | ICD-10-CM | POA: Diagnosis not present

## 2019-04-08 DIAGNOSIS — K760 Fatty (change of) liver, not elsewhere classified: Secondary | ICD-10-CM | POA: Diagnosis not present

## 2019-04-08 DIAGNOSIS — I5032 Chronic diastolic (congestive) heart failure: Secondary | ICD-10-CM | POA: Diagnosis not present

## 2019-04-08 DIAGNOSIS — G43909 Migraine, unspecified, not intractable, without status migrainosus: Secondary | ICD-10-CM | POA: Diagnosis not present

## 2019-04-08 DIAGNOSIS — E1140 Type 2 diabetes mellitus with diabetic neuropathy, unspecified: Secondary | ICD-10-CM | POA: Diagnosis not present

## 2019-04-08 DIAGNOSIS — F329 Major depressive disorder, single episode, unspecified: Secondary | ICD-10-CM | POA: Diagnosis not present

## 2019-04-08 DIAGNOSIS — I2510 Atherosclerotic heart disease of native coronary artery without angina pectoris: Secondary | ICD-10-CM | POA: Diagnosis not present

## 2019-04-08 DIAGNOSIS — G43009 Migraine without aura, not intractable, without status migrainosus: Secondary | ICD-10-CM

## 2019-04-08 DIAGNOSIS — R0609 Other forms of dyspnea: Secondary | ICD-10-CM | POA: Diagnosis not present

## 2019-04-08 DIAGNOSIS — E785 Hyperlipidemia, unspecified: Secondary | ICD-10-CM | POA: Diagnosis not present

## 2019-04-08 DIAGNOSIS — N200 Calculus of kidney: Secondary | ICD-10-CM | POA: Diagnosis not present

## 2019-04-08 DIAGNOSIS — M109 Gout, unspecified: Secondary | ICD-10-CM | POA: Diagnosis not present

## 2019-04-08 DIAGNOSIS — E7849 Other hyperlipidemia: Secondary | ICD-10-CM | POA: Diagnosis not present

## 2019-04-08 DIAGNOSIS — G4730 Sleep apnea, unspecified: Secondary | ICD-10-CM | POA: Diagnosis not present

## 2019-04-08 DIAGNOSIS — M25561 Pain in right knee: Secondary | ICD-10-CM | POA: Diagnosis not present

## 2019-04-08 HISTORY — DX: Migraine without aura, not intractable, without status migrainosus: G43.009

## 2019-04-08 NOTE — Telephone Encounter (Signed)
Patient given detailed instructions per Myocardial Perfusion Study Information Sheet for the test on 04/09/19. Patient notified to arrive 15 minutes early and that it is imperative to arrive on time for appointment to keep from having the test rescheduled.  If you need to cancel or reschedule your appointment, please call the office within 24 hours of your appointment. . Patient verbalized understanding. Kirstie Peri

## 2019-04-09 ENCOUNTER — Ambulatory Visit (INDEPENDENT_AMBULATORY_CARE_PROVIDER_SITE_OTHER): Payer: PPO

## 2019-04-09 ENCOUNTER — Other Ambulatory Visit: Payer: Self-pay

## 2019-04-09 ENCOUNTER — Other Ambulatory Visit: Payer: PPO

## 2019-04-09 DIAGNOSIS — R079 Chest pain, unspecified: Secondary | ICD-10-CM

## 2019-04-09 MED ORDER — TECHNETIUM TC 99M TETROFOSMIN IV KIT
10.7000 | PACK | Freq: Once | INTRAVENOUS | Status: AC | PRN
  Administered 2019-04-09: 10.7 via INTRAVENOUS

## 2019-04-09 MED ORDER — REGADENOSON 0.4 MG/5ML IV SOLN
0.4000 mg | Freq: Once | INTRAVENOUS | Status: AC
  Administered 2019-04-09: 0.4 mg via INTRAVENOUS

## 2019-04-09 MED ORDER — TECHNETIUM TC 99M TETROFOSMIN IV KIT
32.6000 | PACK | Freq: Once | INTRAVENOUS | Status: AC | PRN
  Administered 2019-04-09: 32.6 via INTRAVENOUS

## 2019-04-16 LAB — MYOCARDIAL PERFUSION IMAGING
LV dias vol: 103 mL (ref 46–106)
LV sys vol: 45 mL
Peak HR: 93 {beats}/min
Rest HR: 65 {beats}/min
SDS: 4
SRS: 6
SSS: 10
TID: 1

## 2019-04-17 ENCOUNTER — Telehealth: Payer: Self-pay

## 2019-04-17 NOTE — Telephone Encounter (Signed)
-----   Message from Richardo Priest, MD sent at 04/16/2019  4:53 PM EDT ----- Normal or stable result  This is a good result normal.

## 2019-04-17 NOTE — Telephone Encounter (Signed)
Patient called and notified of test results. 

## 2019-04-23 ENCOUNTER — Ambulatory Visit: Payer: PPO | Admitting: Cardiology

## 2019-04-23 DIAGNOSIS — N952 Postmenopausal atrophic vaginitis: Secondary | ICD-10-CM | POA: Diagnosis not present

## 2019-04-23 DIAGNOSIS — R102 Pelvic and perineal pain: Secondary | ICD-10-CM | POA: Diagnosis not present

## 2019-04-23 DIAGNOSIS — N3946 Mixed incontinence: Secondary | ICD-10-CM | POA: Diagnosis not present

## 2019-04-28 ENCOUNTER — Other Ambulatory Visit: Payer: Self-pay

## 2019-04-28 ENCOUNTER — Ambulatory Visit (INDEPENDENT_AMBULATORY_CARE_PROVIDER_SITE_OTHER): Payer: PPO

## 2019-04-28 DIAGNOSIS — I5032 Chronic diastolic (congestive) heart failure: Secondary | ICD-10-CM | POA: Diagnosis not present

## 2019-04-28 DIAGNOSIS — R079 Chest pain, unspecified: Secondary | ICD-10-CM

## 2019-04-28 DIAGNOSIS — I110 Hypertensive heart disease with heart failure: Secondary | ICD-10-CM

## 2019-04-28 DIAGNOSIS — I2510 Atherosclerotic heart disease of native coronary artery without angina pectoris: Secondary | ICD-10-CM | POA: Diagnosis not present

## 2019-04-28 NOTE — Progress Notes (Signed)
Complete echocardiogram has been performed.  Jimmy Adelae Yodice RDCS, RVT 

## 2019-05-04 ENCOUNTER — Other Ambulatory Visit: Payer: Self-pay | Admitting: Cardiology

## 2019-05-04 NOTE — Progress Notes (Signed)
Cardiology Office Note:    Date:  05/05/2019   ID:  Katelyn [name], DOB 19-Sep-1956, MRN [id number]  PCP:  Algis Greenhouse, MD  Cardiologist:  Shirlee More, MD    Referring MD: Algis Greenhouse, MD    ASSESSMENT:    1. Chronic diastolic heart failure ([place])   2. Hypertensive heart disease with heart failure ([place] [place])   3. Mild CAD    PLAN:    In order of problems listed above:  1. 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  I will recheck her renal function today proBNP level and she will continue her current 20 mg of torsemide daily.  I reviewed her studies and she has no evidence of pulmonary artery hypertension 2. Stable hypertension continue current treatment 3. Stable no indication of ischemia I would not refer to coronary angiography at this time   Next appointment: 6 weeks   Medication Adjustments/Labs and Tests Ordered: Current medicines are reviewed at length with the patient today.  Concerns regarding medicines are outlined above.  No orders of the defined types were placed in this encounter.  No orders of the defined types were placed in this encounter.   No chief complaint on file.   History of Present Illness:    Katelyn [name] is a 62 y.o. female with a hx of hypertension mild CAD dyslipidemia left bundle branch block with an ejection fraction of 55 to 60% restrictive lung disease with shortness of breath due to deconditioning and obesity.  She is statin intolerant Shewas last seen 03/14/2019 for edema, decompensated heart failure associated with gabapentin and chest pain. Compliance with diet, lifestyle and medications: Last visit we did increase her diuretic she is only taking 1 tablet a day  She is unimproved her weight is up 5 pounds still edematous and short of breath when she does more than mild activities  no orthopnea chest pain palpitation or syncope no cough or wheezing.  She is previously been seen by pulmonary but never had a sleep test.  She is tired during the day falls asleep frequently and snores severely and her husband says she stops breathing at night.  I suspect she has pseudo-heart failure we will check a proBNP level and a home sleep study.  She brings a list of questions including first that she had Parkinson's disease I told her is not obvious to me but outside the scope of my practice secondary to ask if she has pulmonary artery hypertension I told her no on the basis of echocardiogram.  I think clinically she has hypoventilation syndrome and likely severe obstructive sleep apnea we will set up a home sleep study. Past Medical History:  Diagnosis Date  . Anemia    previous transfusion 2/17  . Angina pectoris ([place]) 11/24/2015  . Anxiety   . Arthritis   . Benign hypertension 03/14/2016  . Bursitis of right hip   . CAD (coronary artery disease)    Cardiac catheterization June 2014 in [facility] - 50% circumflex stenosis  . CAD in native artery 11/24/2015  . Cannot sleep 12/07/2015  . Chronic kidney disease    does not see nephrologist  . Chronic low back pain without sciatica 03/14/2016  . Complication of anesthesia   . Cough 04/19/2017   Overview:  Last Assessment & Plan:  Formatting of this note may be different from the original. Cough - ? ACE related with AR triggers   Plan  Patient Instructions  Discuss with your primary doctor that lisinopril pain, need making your cough worse. May use Mucinex DM twice daily as needed for cough and congestion Zyrtec 10 mg at bedtime as needed for drainage Saline nasal spray as needed. Lab tests today Activity as tolerated. Follow with Dr. [name] in 3-4 months and As needed   Please contact office for sooner follow up if symptoms do not improve or worsen or seek emergency care   . Depression   . DOE (dyspnea on exertion) 04/19/2017   Overview:  2017:    Last Assessment & Plan:  Formatting of this note may be different from the original. DOE ? Etiology , felt to be component of deconditioning  Workup has been unrevealing with neg VQ scan , PFT and CXR , reported neg card w/up  Would cont exrecise regimen as tolerate  Plan  Patient Instructions  Discuss with your primary doctor that lisinopril pain, need making your cough worse. May use Mucinex DM twice daily as needed for cough and congestion Zyrtec 10 mg at bedtime as needed for drainage Saline nasal spray as needed. Lab tests today Activity as tolerated. Follow with Dr. [name] in 3-4 months and As needed   Please contact office for sooner follow up if symptoms do not improve or worsen or seek emergency care   . Dyspnea    with exertion   " lazy lung" - per  [name] from back issues- 06/2016  . Elevated liver enzymes 12/05/2016  . Essential hypertension   . GERD (gastroesophageal reflux disease)   . Gout 03/14/2016  . Greater trochanteric bursitis of right hip 02/02/2012  . H/O hiatal hernia   . Heart murmur   . History of blood transfusion 2016  . History of kidney stones   . Hypercholesterolemia   . Hyperlipemia 01/01/2017  . Iliotibial band syndrome of right side 02/02/2012  . Iron deficiency anemia due to chronic blood loss 03/14/2016  . LBBB (left bundle branch block) 01/01/2017  . Left bundle branch block   . Lumbar stenosis   . Meralgia paraesthetica 12/05/2016  . Neuropathy   . OSA (obstructive sleep apnea) 05/24/2016   Overview:  Managed PULM  . PONV (postoperative nausea and vomiting)    "no N/V with patch"  . Postprocedural state 11/26/2015  . 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[place]) 12/07/2015  . Restless leg 12/07/2015  . Restless leg syndrome   . 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   Overview:  2017: dx  . S/P lumbar laminectomy 11/26/2015  . S/P lumbar spinal fusion 08/29/2016  . Sleep apnea    mild case  no cpap  . Tinnitus 12/05/2016  . Type 2 diabetes mellitus ([place])   . Type 2  diabetes mellitus without complication, with long-term current use of insulin ([place]) 03/14/2016   Overview:  Managed ENDO    Past Surgical History:  Procedure Laterality Date  . ABDOMINAL HYSTERECTOMY  1983  . APPENDECTOMY  Age 12  . BACK SURGERY     FIRST LUMBAR FUSION/ SURGERY APRIL 2012 AND FUSION WITH INSTRUMENTATION SEPT 2012  . CARDIAC CATHETERIZATION     x 2  . CARPAL TUNNEL RELEASE     bil  . CHOLECYSTECTOMY  1990's  . CYSTO EXTRACTION KIDNEY STONES    . EXCISION/RELEASE BURSA HIP  02/02/2012   Procedure: EXCISION/RELEASE BURSA HIP;  Surgeon: Tobi Bastos, MD;  Location: [facility];  Service: Orthopedics;  Laterality: Right;  Right Hip Bursectomy  . EYE SURGERY Bilateral    cataracts  . KIDNEY STONE SURGERY  2008  . Left knee surgery x 2  1996   reconstruction  . LUMBAR DISC SURGERY  08/2016  . LUMBAR LAMINECTOMY/DECOMPRESSION MICRODISCECTOMY Right 11/26/2015   Procedure: Extraforaminal Microdiscectomy  - Lumbar two-three- right;  Surgeon: Eustace Moore, MD;  Location: [place] [facility];  Service: Neurosurgery;  Laterality: Right;  right   . LUMBAR WOUND DEBRIDEMENT N/A 10/25/2016   Procedure: Lumbar wound revision;  Surgeon: Eustace Moore, MD;  Location: [place];  Service: Neurosurgery;  Laterality: N/A;  Lumbar wound revision  . Right shoulder surgery  2010   spur    Current Medications: Current Meds  Medication Sig  . aspirin 81 MG tablet Take 81 mg by mouth at bedtime.   . carvedilol (COREG) 6.25 MG tablet Take 6.25 mg by mouth 2 (two) times daily.   . DULoxetine (CYMBALTA) 60 MG capsule Take 60 mg by mouth at bedtime.   . gabapentin (NEURONTIN) 300 MG capsule Take 300 mg by mouth daily.  Marland Kitchen GLUCOSAMINE-CHONDROITIN PO Take 1 tablet by mouth daily.  . insulin NPH-regular Human (NOVOLIN 70/30) (70-30) 100 UNIT/ML injection Inject 100 Units into the skin 3 (three) times daily.   . mirtazapine (REMERON) 15 MG tablet Take 15 mg by mouth at bedtime.  . Multiple Vitamin  (MULTIVITAMIN) tablet Take 1 tablet by mouth daily.  . nitroGLYCERIN (NITROSTAT) 0.4 MG SL tablet Place 0.4 mg under the tongue every 5 (five) minutes as needed for chest pain.   Glory Rosebush VERIO test strip USE 1 STRIP TO CHECK GLUCOSE 4 TIMES DAILY AS DIRECTED  . oxybutynin (DITROPAN) 5 MG tablet Take 5 mg by mouth daily.  . pantoprazole (PROTONIX) 40 MG tablet Take 40 mg by mouth daily.  . pramipexole (MIRAPEX) 1 MG tablet Take 1 mg by mouth at bedtime.  . torsemide (DEMADEX) 20 MG tablet Take 40 mg by mouth daily.   . TURMERIC CURCUMIN PO Take 1 tablet by mouth daily.  . valACYclovir (VALTREX) 1000 MG tablet Take 1,000 mg by mouth 2 (two) times daily as needed.   Current Facility-Administered Medications for the 05/05/19 encounter (Office Visit) with Richardo Priest, MD  Medication  . triamcinolone acetonide (KENALOG) 10 MG/ML injection 10 mg  . triamcinolone acetonide (KENALOG) 10 MG/ML injection 10 mg     Allergies:   Atorvastatin, Talwin [pentazocine], and Other   Social History   Socioeconomic History  . Marital status: Married    Spouse name: Not on file  . Number of children: Not on file  . Years of education: Not on file  . Highest education level: Not on file  Occupational History  . Occupation: Archivist  Social Needs  . Financial resource strain: Not on file  . Food insecurity    Worry: Not on file    Inability: Not on file  . Transportation needs    Medical: Not on file    Non-medical: Not on file  Tobacco Use  . Smoking status: Never Smoker  . Smokeless tobacco: Never Used  Substance and Sexual Activity  . Alcohol use: Yes    Alcohol/week: 0.0 standard drinks    Comment: Rarely  . Drug use: No  . Sexual activity: Not on file  Lifestyle  . Physical activity    Days per week: Not on  file    Minutes per session: Not on file  . Stress: Not on file  Relationships  . Social Herbalist on phone: Not on file    Gets together: Not  on file    Attends religious service: Not on file    Active member of club or organization: Not on file    Attends meetings of clubs or organizations: Not on file    Relationship status: Not on file  Other Topics Concern  . Not on file  Social History Narrative  . Not on file     Family History: The patient's  family history includes Asthma in her sister; Bone cancer in her sister; Heart failure in her mother; Hypertension in her father; Lung cancer in her father; Stroke in her father. ROS:   Please see the history of present illness.    All other systems reviewed and are negative.  EKGs/Labs/Other Studies Reviewed:    The following studies were reviewed today:  Echo 04/28/2019:    1. The left ventricle has normal systolic function with an ejection fraction of 60-65%. The cavity size was normal. There is mildly increased left ventricular wall thickness. Left ventricular diastolic Doppler parameters are consistent with impaired relaxation and normal LA pressure  2. The right ventricle has normal systolic function. The cavity was normal. There is no increase in right ventricular wall thickness.  3. The aortic root is normal in size and structure.  Lexiscan myoview 04/09/2019:     1. The left ventricle has normal systolic function with an ejection fraction of 60-65%. The cavity size was normal. There is mildly increased left ventricular wall thickness. Left ventricular diastolic Doppler parameters are consistent with impaired  relaxation.  2. The right ventricle has normal systolic function. The cavity was normal. There is no increase in right ventricular wall thickness.  3. The aortic root is normal in size and structure.  Recent Labs: 03/14/2019: ALT 117; BUN 26; Creatinine, Ser 1.39; NT-Pro BNP 37; Potassium 4.4; Sodium 142  Recent Lipid Panel No results found for: CHOL, TRIG, HDL, CHOLHDL, VLDL, LDLCALC, LDLDIRECT  Physical Exam:    VS:  BP 128/72 (BP Location: Right Arm, Patient  Position: Sitting, Cuff Size: Large)   Pulse 84   Temp 99 F (37.2 C)   Ht 5\' 2"  (1.575 m)   Wt 213 lb (96.6 kg)   SpO2 97%   BMI 38.96 kg/m     Wt Readings from Last 3 Encounters:  05/05/19 213 lb (96.6 kg)  04/09/19 215 lb (97.5 kg)  03/14/19 215 lb 12.8 oz (97.9 kg)     GEN:  Well nourished, well developed in no acute distress HEENT: Normal NECK: No JVD; No carotid bruits LYMPHATICS: No lymphadenopathy CARDIAC: RRR, no murmurs, rubs, gallops RESPIRATORY:  Clear to auscultation without rales, wheezing or rhonchi  ABDOMEN: Soft, non-tender, non-distended MUSCULOSKELETAL: 1-2+ bilateral ankle to knee edema; No deformity  SKIN: Warm and dry NEUROLOGIC:  Alert and oriented x 3 PSYCHIATRIC:  Normal affect    Signed, Shirlee More, MD  05/05/2019 8:44 AM    [facility]

## 2019-05-05 ENCOUNTER — Ambulatory Visit (INDEPENDENT_AMBULATORY_CARE_PROVIDER_SITE_OTHER): Payer: PPO | Admitting: Cardiology

## 2019-05-05 ENCOUNTER — Other Ambulatory Visit: Payer: Self-pay

## 2019-05-05 ENCOUNTER — Encounter: Payer: Self-pay | Admitting: Cardiology

## 2019-05-05 VITALS — BP 128/72 | HR 84 | Temp 99.0°F | Ht 62.0 in | Wt 213.0 lb

## 2019-05-05 DIAGNOSIS — R0602 Shortness of breath: Secondary | ICD-10-CM | POA: Diagnosis not present

## 2019-05-05 DIAGNOSIS — Z8669 Personal history of other diseases of the nervous system and sense organs: Secondary | ICD-10-CM

## 2019-05-05 DIAGNOSIS — M79606 Pain in leg, unspecified: Secondary | ICD-10-CM

## 2019-05-05 DIAGNOSIS — I5032 Chronic diastolic (congestive) heart failure: Secondary | ICD-10-CM | POA: Diagnosis not present

## 2019-05-05 DIAGNOSIS — R079 Chest pain, unspecified: Secondary | ICD-10-CM | POA: Diagnosis not present

## 2019-05-05 DIAGNOSIS — I110 Hypertensive heart disease with heart failure: Secondary | ICD-10-CM | POA: Diagnosis not present

## 2019-05-05 DIAGNOSIS — I2510 Atherosclerotic heart disease of native coronary artery without angina pectoris: Secondary | ICD-10-CM | POA: Diagnosis not present

## 2019-05-05 DIAGNOSIS — M7989 Other specified soft tissue disorders: Secondary | ICD-10-CM | POA: Diagnosis not present

## 2019-05-05 NOTE — Patient Instructions (Addendum)
Medication Instructions:  Your physician recommends that you continue on your current medications as directed. Please refer to the Current Medication list given to you today.  If you need a refill on your cardiac medications before your next appointment, please call your pharmacy.   Lab work: Your physician recommends that you return for lab work today: BMP, ProBNP, D-dimer.   If you have labs (blood work) drawn today and your tests are completely normal, you will receive your results only by: Marland Kitchen MyChart Message (if you have MyChart) OR . A paper copy in the mail If you have any lab test that is abnormal or we need to change your treatment, we will call you to review the results.  Testing/Procedures: Your physician has requested that you have a lower or upper extremity venous duplex. This test is an ultrasound of the veins in the legs or arms. It looks at venous blood flow that carries blood from the heart to the legs or arms. Allow one hour for a Lower Venous exam. Allow thirty minutes for an Upper Venous exam. There are no restrictions or special instructions.   Your physician has recommended that you have a sleep study. This test records several body functions during sleep, including: brain activity, eye movement, oxygen and carbon dioxide blood levels, heart rate and rhythm, breathing rate and rhythm, the flow of air through your mouth and nose, snoring, body muscle movements, and chest and belly movement. You will be contacted regarding scheduled date and time for your sleep study once insurance approval is received.   Follow-Up: At [facility], you and your health needs are our priority.  As part of our continuing mission to provide you with exceptional heart care, we have created designated Provider Care Teams.  These Care Teams include your primary Cardiologist (physician) and Advanced Practice Providers (APPs -  Physician Assistants and Nurse Practitioners) who all work together to  provide you with the care you need, when you need it. You will need a follow up appointment in 6 weeks.

## 2019-05-05 NOTE — Addendum Note (Signed)
Addended by: Austin Miles on: 05/05/2019 08:56 AM   Modules accepted: Orders

## 2019-05-06 ENCOUNTER — Telehealth: Payer: Self-pay

## 2019-05-06 LAB — BASIC METABOLIC PANEL
BUN/Creatinine Ratio: 18 (ref 12–28)
BUN: 23 mg/dL (ref 8–27)
CO2: 23 mmol/L (ref 20–29)
Calcium: 9.3 mg/dL (ref 8.7–10.3)
Chloride: 93 mmol/L — ABNORMAL LOW (ref 96–106)
Creatinine, Ser: 1.26 mg/dL — ABNORMAL HIGH (ref 0.57–1.00)
GFR calc Af Amer: 53 mL/min/{1.73_m2} — ABNORMAL LOW (ref >59)
GFR calc non Af Amer: 46 mL/min/{1.73_m2} — ABNORMAL LOW (ref >59)
Glucose: 410 mg/dL — ABNORMAL HIGH (ref 65–99)
Potassium: 4.6 mmol/L (ref 3.5–5.2)
Sodium: 133 mmol/L — ABNORMAL LOW (ref 134–144)

## 2019-05-06 LAB — D-DIMER, QUANTITATIVE: D-DIMER: 0.55 mg/L FEU — ABNORMAL HIGH (ref 0.00–0.49)

## 2019-05-06 LAB — PRO B NATRIURETIC PEPTIDE: NT-Pro BNP: 20 pg/mL (ref 0–287)

## 2019-05-06 NOTE — Telephone Encounter (Signed)
-----   Message from Richardo Priest, MD sent at 05/06/2019  8:18 AM EDT ----- Good result no changes

## 2019-05-06 NOTE — Telephone Encounter (Signed)
Left message that labs were good, no changes needed. If patient needs further assistance call office.

## 2019-05-09 ENCOUNTER — Telehealth: Payer: Self-pay | Admitting: *Deleted

## 2019-05-09 NOTE — Telephone Encounter (Signed)
Patient informed that her home sleep study equipment pick up appointment has been scheduled for Monday, 07/21/2019, at 12:00 pm. Patient provided with address of location and informed that she is on a waiting list if a sooner appointment becomes available. Patient is agreeable and verbalized understanding. No further questions.

## 2019-05-09 NOTE — Addendum Note (Signed)
Addended by: Austin Miles on: 05/09/2019 08:34 AM   Modules accepted: Orders

## 2019-05-12 ENCOUNTER — Other Ambulatory Visit: Payer: Self-pay

## 2019-05-12 ENCOUNTER — Ambulatory Visit (INDEPENDENT_AMBULATORY_CARE_PROVIDER_SITE_OTHER): Payer: PPO

## 2019-05-12 DIAGNOSIS — M79606 Pain in leg, unspecified: Secondary | ICD-10-CM

## 2019-05-12 DIAGNOSIS — M7989 Other specified soft tissue disorders: Secondary | ICD-10-CM

## 2019-05-12 DIAGNOSIS — R0602 Shortness of breath: Secondary | ICD-10-CM

## 2019-05-12 DIAGNOSIS — R079 Chest pain, unspecified: Secondary | ICD-10-CM

## 2019-05-12 NOTE — Progress Notes (Signed)
Lower extremity venous duplex exam has been performed. Normal exam.  Jimmy Jaonna Word RDCS, RVT

## 2019-06-11 ENCOUNTER — Encounter: Payer: Self-pay | Admitting: Cardiology

## 2019-06-11 ENCOUNTER — Ambulatory Visit (INDEPENDENT_AMBULATORY_CARE_PROVIDER_SITE_OTHER): Payer: PPO | Admitting: Cardiology

## 2019-06-11 ENCOUNTER — Other Ambulatory Visit: Payer: Self-pay

## 2019-06-11 VITALS — BP 120/70 | HR 84 | Ht 62.0 in | Wt 214.5 lb

## 2019-06-11 DIAGNOSIS — I110 Hypertensive heart disease with heart failure: Secondary | ICD-10-CM | POA: Diagnosis not present

## 2019-06-11 DIAGNOSIS — I2510 Atherosclerotic heart disease of native coronary artery without angina pectoris: Secondary | ICD-10-CM | POA: Diagnosis not present

## 2019-06-11 DIAGNOSIS — I447 Left bundle-branch block, unspecified: Secondary | ICD-10-CM | POA: Diagnosis not present

## 2019-06-11 DIAGNOSIS — R0602 Shortness of breath: Secondary | ICD-10-CM | POA: Insufficient documentation

## 2019-06-11 NOTE — Progress Notes (Signed)
Cardiology Office Note:    Date:  06/11/2019   ID:  DEDRA [name], DOB [birth year], MRN [id number]  PCP:  Algis Greenhouse, MD  Cardiologist:  Shirlee More, MD    Referring MD: Algis Greenhouse, MD    ASSESSMENT:    1. Mild CAD   2. Shortness of breath   3. LBBB (left bundle branch block)   4. Hypertensive heart disease with heart failure ([place])    PLAN:    In order of problems listed above:  1. Stable medical therapy decision after sleep study she requires repeat coronary angiography 2. 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 Stable left bundle branch block on EKG 4. Stable hypertension BP at target continue treatment including loop diuretic beta-blocker   Next appointment: 3 months   Medication Adjustments/Labs and Tests Ordered: Current medicines are reviewed at length with the patient today.  Concerns regarding medicines are outlined above.  No orders of the defined types were placed in this encounter.  No orders of the defined types were placed in this encounter.   Chief Complaint  Patient presents with   other    6 wk f/u c/o sob. Meds reviewed verbally with pt.    History of Present Illness:    Katelyn [name] is a 63 y.o. female with a hx of  hypertension mild CAD dyslipidemia left bundle branch block with an ejection fraction of 55 to 60% restrictive lung disease with shortness of breath due to deconditioning and obesity.  She is statin intolerant  last seen 05/05/2019. Compliance with diet, lifestyle and medications: Yes  She is still pending a sleep study be done the next few weeks.  Her proBNP level and echocardiogram did not support a diagnosis of heart failure she no longer takes gabapentin her edema is better but she is still short of breath with anything more than usual activities and she is convinced she has congestive heart failure.  At her request we will do a  CT of the chest she said she worked occupationally with noxious chemicals and has a cough.  She has had no angina but questions whether she requires repeat coronary angiography and a decision to to this may wish to do the sleep study and if she does not have significant sleep apnea she did undergo left and right heart catheterization.  Her husband does say that she snores severely and he leaves the bedroom because of it at nighttime.  I suspect what she has clinically is pseudo-heart failure due to untreated sleep apnea.  With increased diuretic dose will check renal function potassium proBNP she requested a CMP and she tells me she has fatty liver and then tells me she would like to have a CBC done as she has a background history of anemia requiring transfusion.  We will do these labs today.  I reviewed the results of her echocardiogram and Myoview study below with the patient no evidence of ischemia and normal left ventricular ejection fraction no findings of pulmonary artery hypertension or diastolic heart failure.  I went back and reviewed the pulmonary notes from 2017 and there is no discussion of a sleep test or findings of sleep apnea.  Echo 04/28/2019:    1. The left ventricle has normal systolic function with an ejection fraction of 60-65%. The cavity size was normal. There is mildly increased left ventricular wall thickness. Left ventricular diastolic  Doppler parameters are consistent with impaired relaxation and normal LA pressure 2. The right ventricle has normal systolic function. The cavity was normal. There is no increase in right ventricular wall thickness. 3. The aortic root is normal in size and structure.  Lexiscan myoview 04/09/2019:    1. The left ventricle has normal systolic function with an ejection fraction of 60-65%. The cavity size was normal. There is mildly increased left ventricular wall thickness. Left ventricular diastolic Doppler parameters are consistent with impaired    relaxation. 2. The right ventricle has normal systolic function. The cavity was normal. There is no increase in right ventricular wall thickness. 3. The aortic root is normal in size and structure. Past Medical History:  Diagnosis Date   Anemia    previous transfusion 2/17   Angina pectoris ([place]) 11/24/2015   Anxiety    Arthritis    Benign hypertension 03/14/2016   Bursitis of right hip    CAD (coronary artery disease)    Cardiac catheterization June 2014 in [place] - 50% circumflex stenosis   CAD in native artery 11/24/2015   Cannot sleep 12/07/2015   Chronic kidney disease    does not see nephrologist   Chronic low back pain without sciatica [id number]   Complication of anesthesia    Cough 04/19/2017   Overview:  Last Assessment & Plan:  Formatting of this note may be different from the original. Cough - ? ACE related with AR triggers   Plan  Patient Instructions  Discuss with your primary doctor that lisinopril pain, need making your cough worse. May use Mucinex DM twice daily as needed for cough and congestion Zyrtec 10 mg at bedtime as needed for drainage Saline nasal spray as needed. Lab tests today Activity as tolerated. Follow with Dr. [name] in 3-4 months and As needed   Please contact office for sooner follow up if symptoms do not improve or worsen or seek emergency care    Depression    DOE (dyspnea on exertion) 04/19/2017   Overview:  2017:   Last Assessment & Plan:  Formatting of this note may be different from the original. DOE ? Etiology , felt to be component of deconditioning  Workup has been unrevealing with neg VQ scan , PFT and CXR , reported neg card w/up  Would cont exrecise regimen as tolerate  Plan  Patient Instructions  Discuss with your primary doctor that lisinopril pain, need making your cough worse. May use Mucinex DM twice daily as needed for cough and congestion Zyrtec 10 mg at bedtime as needed for drainage Saline nasal spray as needed. Lab tests  today Activity as tolerated. Follow with Dr. [name] in 3-4 months and As needed   Please contact office for sooner follow up if symptoms do not improve or worsen or seek emergency care    Dyspnea    with exertion   " lazy lung" - per  [name] from back issues- 06/2016   Elevated liver enzymes 12/05/2016   Essential hypertension    GERD (gastroesophageal reflux disease)    Gout 03/14/2016   Greater trochanteric bursitis of right hip 02/02/2012   H/O hiatal hernia    Heart murmur    History of blood transfusion 2016   History of kidney stones    Hypercholesterolemia    Hyperlipemia 01/01/2017   Iliotibial band syndrome of right side 02/02/2012   Iron deficiency anemia due to chronic blood loss 03/14/2016   LBBB (left bundle branch block) 01/01/2017  Left bundle branch block    Lumbar stenosis    Meralgia paraesthetica 12/05/2016   Neuropathy    OSA (obstructive sleep apnea) 05/24/2016   Overview:  Managed PULM   PONV (postoperative nausea and vomiting)    "no N/V with patch"   Postprocedural state 11/26/2015   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[place]) 12/07/2015   Restless leg 12/07/2015   Restless leg syndrome    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   Overview:  2017: dx   S/P lumbar laminectomy 11/26/2015   S/P lumbar spinal fusion 08/29/2016   Sleep apnea    mild case  no cpap   Tinnitus 12/05/2016   Type 2 diabetes mellitus ([place])    Type 2 diabetes mellitus without complication, with long-term current use of insulin ([place]) 03/14/2016   Overview:  Managed ENDO    Past Surgical History:  Procedure Laterality Date   ABDOMINAL HYSTERECTOMY  1983   APPENDECTOMY  Age 81   BACK SURGERY     FIRST LUMBAR FUSION/ SURGERY APRIL 2012 AND FUSION WITH INSTRUMENTATION SEPT 2012   CARDIAC CATHETERIZATION     x 2   CARPAL TUNNEL RELEASE     bil   CHOLECYSTECTOMY  1990's   CYSTO EXTRACTION KIDNEY STONES     EXCISION/RELEASE BURSA HIP  02/02/2012   Procedure:  EXCISION/RELEASE BURSA HIP;  Surgeon: Tobi Bastos, MD;  Location: [facility];  Service: Orthopedics;  Laterality: Right;  Right Hip Bursectomy   EYE SURGERY Bilateral    cataracts   KIDNEY STONE SURGERY  2008   Left knee surgery x 2  1996   reconstruction   LUMBAR DISC SURGERY  08/2016   LUMBAR LAMINECTOMY/DECOMPRESSION MICRODISCECTOMY Right 11/26/2015   Procedure: Extraforaminal Microdiscectomy  - Lumbar two-three- right;  Surgeon: Eustace Moore, MD;  Location: [facility];  Service: Neurosurgery;  Laterality: Right;  right    LUMBAR WOUND DEBRIDEMENT N/A 10/25/2016   Procedure: Lumbar wound revision;  Surgeon: Eustace Moore, MD;  Location: [facility];  Service: Neurosurgery;  Laterality: N/A;  Lumbar wound revision   Right shoulder surgery  2010   spur    Current Medications: Current Meds  Medication Sig   aspirin 81 MG tablet Take 81 mg by mouth at bedtime.    carvedilol (COREG) 6.25 MG tablet Take 6.25 mg by mouth 2 (two) times daily.    DULoxetine (CYMBALTA) 60 MG capsule Take 60 mg by mouth at bedtime.    GLUCOSAMINE-CHONDROITIN PO Take 1 tablet by mouth daily.   insulin NPH-regular Human (NOVOLIN 70/30) (70-30) 100 UNIT/ML injection Inject 100 Units into the skin 3 (three) times daily.    mirtazapine (REMERON) 15 MG tablet Take 15 mg by mouth at bedtime.   Multiple Vitamin (MULTIVITAMIN) tablet Take 1 tablet by mouth daily.   nitroGLYCERIN (NITROSTAT) 0.4 MG SL tablet Place 0.4 mg under the tongue every 5 (five) minutes as needed for chest pain.    ONETOUCH VERIO test strip USE 1 STRIP TO CHECK GLUCOSE 4 TIMES DAILY AS DIRECTED   oxybutynin (DITROPAN) 5 MG tablet Take 5 mg by mouth daily.   pantoprazole (PROTONIX) 40 MG tablet Take 1 tablet by mouth once daily   pramipexole (MIRAPEX) 1 MG tablet Take 1 mg by mouth at bedtime.   torsemide (DEMADEX) 20 MG tablet Take 40 mg by mouth daily.    TURMERIC CURCUMIN PO Take 1 tablet by mouth daily.   valACYclovir  (VALTREX) 1000 MG tablet Take 1,000 mg by mouth  2 (two) times daily as needed.   Current Facility-Administered Medications for the 06/11/19 encounter (Office Visit) with Richardo Priest, MD  Medication   triamcinolone acetonide (KENALOG) 10 MG/ML injection 10 mg   triamcinolone acetonide (KENALOG) 10 MG/ML injection 10 mg     Allergies:   Atorvastatin, Talwin [pentazocine], and Other   Social History   Socioeconomic History   Marital status: Married    Spouse name: Not on file   Number of children: Not on file   Years of education: Not on file   Highest education level: Not on file  Occupational History   Occupation: Archivist  Social Needs   Financial resource strain: Not on file   Food insecurity    Worry: Not on file    Inability: Not on file   Transportation needs    Medical: Not on file    Non-medical: Not on file  Tobacco Use   Smoking status: Never Smoker   Smokeless tobacco: Never Used  Substance and Sexual Activity   Alcohol use: Yes    Alcohol/week: 0.0 standard drinks    Comment: Rarely   Drug use: No   Sexual activity: Not on file  Lifestyle   Physical activity    Days per week: Not on file    Minutes per session: Not on file   Stress: Not on file  Relationships   Social connections    Talks on phone: Not on file    Gets together: Not on file    Attends religious service: Not on file    Active member of club or organization: Not on file    Attends meetings of clubs or organizations: Not on file    Relationship status: Not on file  Other Topics Concern   Not on file  Social History Narrative   Not on file     Family History: The patient's family history includes Asthma in her sister; Bone cancer in her sister; Heart failure in her mother; Hypertension in her father; Lung cancer in her father; Stroke in her father. ROS:   Please see the history of present illness.    All other systems reviewed and are  negative.  EKGs/Labs/Other Studies Reviewed:    The following studies were reviewed today:  EKG:  EKG ordered today and personally reviewed.  The ekg ordered today demonstrates sinus rhythm left bundle branch block  Recent Labs: 03/14/2019: ALT 117 05/05/2019: BUN 23; Creatinine, Ser 1.26; NT-Pro BNP 20; Potassium 4.6; Sodium 133  Recent Lipid Panel No results found for: CHOL, TRIG, HDL, CHOLHDL, VLDL, LDLCALC, LDLDIRECT  Physical Exam:    VS:  BP 120/70 (BP Location: Right Arm, Patient Position: Sitting, Cuff Size: Large)    Pulse 84    Ht 5\' 2"  (1.575 m)    Wt 214 lb 8 oz (97.3 kg)    SpO2 98%    BMI 39.23 kg/m     Wt Readings from Last 3 Encounters:  06/11/19 214 lb 8 oz (97.3 kg)  05/05/19 213 lb (96.6 kg)  04/09/19 215 lb (97.5 kg)     GEN:  Well nourished, well developed in no acute distress HEENT: Normal NECK: No JVD; No carotid bruits LYMPHATICS: No lymphadenopathy CARDIAC: [place] with second heart sound RRR, no murmurs, rubs, gallops RESPIRATORY:  Clear to auscultation without rales, wheezing or rhonchi  ABDOMEN: Soft, non-tender, non-distended MUSCULOSKELETAL:  1+ bilateral lower extremity edema; No deformity  SKIN: Warm and dry NEUROLOGIC:  Alert and oriented x  3 PSYCHIATRIC:  Normal affect    Signed, Shirlee More, MD  06/11/2019 9:00 AM    [facility]

## 2019-06-11 NOTE — Patient Instructions (Signed)
Medication Instructions:  Your physician recommends that you continue on your current medications as directed. Please refer to the Current Medication list given to you today.  If you need a refill on your cardiac medications before your next appointment, please call your pharmacy.   Lab work: Your physician recommends that you return for lab work today: CBC, CMP, ProBNP.   If you have labs (blood work) drawn today and your tests are completely normal, you will receive your results only by: Marland Kitchen MyChart Message (if you have MyChart) OR . A paper copy in the mail If you have any lab test that is abnormal or we need to change your treatment, we will call you to review the results.  Testing/Procedures: You had an EKG today.   Non-Cardiac CT scanning, (CAT scanning), is a noninvasive, special x-ray that produces cross-sectional images of the body using x-rays and a computer. CT scans help physicians diagnose and treat medical conditions. For some CT exams, a contrast material is used to enhance visibility in the area of the body being studied. CT scans provide greater clarity and reveal more details than regular x-ray exams. Your CT chest w/o contrast will be done at [facility]. We will contact you with an appointment date and time once we receive insurance approval.   Follow-Up: At [facility], you and your health needs are our priority.  As part of our continuing mission to provide you with exceptional heart care, we have created designated Provider Care Teams.  These Care Teams include your primary Cardiologist (physician) and Advanced Practice Providers (APPs -  Physician Assistants and Nurse Practitioners) who all work together to provide you with the care you need, when you need it. You will need a follow up appointment in 3 months.

## 2019-06-12 LAB — COMPREHENSIVE METABOLIC PANEL
ALT: 116 IU/L — ABNORMAL HIGH (ref 0–32)
AST: 123 IU/L — ABNORMAL HIGH (ref 0–40)
Albumin/Globulin Ratio: 1.4 (ref 1.2–2.2)
Albumin: 4.4 g/dL (ref 3.8–4.8)
Alkaline Phosphatase: 149 IU/L — ABNORMAL HIGH (ref 39–117)
BUN/Creatinine Ratio: 28 (ref 12–28)
BUN: 31 mg/dL — ABNORMAL HIGH (ref 8–27)
Bilirubin Total: 0.4 mg/dL (ref 0.0–1.2)
CO2: 28 mmol/L (ref 20–29)
Calcium: 9.7 mg/dL (ref 8.7–10.3)
Chloride: 98 mmol/L (ref 96–106)
Creatinine, Ser: 1.1 mg/dL — ABNORMAL HIGH (ref 0.57–1.00)
GFR calc Af Amer: 62 mL/min/{1.73_m2} (ref >59)
GFR calc non Af Amer: 54 mL/min/{1.73_m2} — ABNORMAL LOW (ref >59)
Globulin, Total: 3.2 g/dL (ref 1.5–4.5)
Glucose: 188 mg/dL — ABNORMAL HIGH (ref 65–99)
Potassium: 4.3 mmol/L (ref 3.5–5.2)
Sodium: 139 mmol/L (ref 134–144)
Total Protein: 7.6 g/dL (ref 6.0–8.5)

## 2019-06-12 LAB — CBC
Hematocrit: 38.8 % (ref 34.0–46.6)
Hemoglobin: 13.2 g/dL (ref 11.1–15.9)
MCH: 34.3 pg — ABNORMAL HIGH (ref 26.6–33.0)
MCHC: 34 g/dL (ref 31.5–35.7)
MCV: 101 fL — ABNORMAL HIGH (ref 79–97)
Platelets: 234 10*3/uL (ref 150–450)
RBC: 3.85 x10E6/uL (ref 3.77–5.28)
RDW: 13.2 % (ref 11.7–15.4)
WBC: 7.6 10*3/uL (ref 3.4–10.8)

## 2019-06-12 LAB — PRO B NATRIURETIC PEPTIDE: NT-Pro BNP: 33 pg/mL (ref 0–287)

## 2019-06-13 DIAGNOSIS — R0602 Shortness of breath: Secondary | ICD-10-CM | POA: Diagnosis not present

## 2019-06-13 DIAGNOSIS — R05 Cough: Secondary | ICD-10-CM | POA: Diagnosis not present

## 2019-06-18 ENCOUNTER — Telehealth: Payer: Self-pay | Admitting: Cardiology

## 2019-06-18 NOTE — Telephone Encounter (Signed)
CT chest results have been pulled from [facility]. Will have Dr. Bettina Gavia advise and update patient accordingly.

## 2019-06-18 NOTE — Telephone Encounter (Signed)
Patient called wanting to know about lab results and CT results

## 2019-06-19 NOTE — Telephone Encounter (Signed)
Patient returned your call for results °

## 2019-06-19 NOTE — Telephone Encounter (Signed)
Left message to return call to discuss results.

## 2019-06-19 NOTE — Telephone Encounter (Signed)
Patient advised of lab results per Dr Bettina Gavia.  Patient advised to follow up with Dr Lyndel Safe as she has seen him in the past regarding abnormal LFT's.  Patient will contact his office by phone.  Patient agreed to plan and verbalized understanding.  No further questions.

## 2019-07-21 ENCOUNTER — Ambulatory Visit (HOSPITAL_BASED_OUTPATIENT_CLINIC_OR_DEPARTMENT_OTHER): Payer: PPO | Attending: Cardiology | Admitting: Cardiovascular Disease

## 2019-07-21 ENCOUNTER — Other Ambulatory Visit: Payer: Self-pay

## 2019-07-21 ENCOUNTER — Encounter

## 2019-07-21 DIAGNOSIS — Z8669 Personal history of other diseases of the nervous system and sense organs: Secondary | ICD-10-CM | POA: Diagnosis not present

## 2019-07-21 DIAGNOSIS — G4733 Obstructive sleep apnea (adult) (pediatric): Secondary | ICD-10-CM | POA: Diagnosis not present

## 2019-07-31 ENCOUNTER — Encounter (HOSPITAL_BASED_OUTPATIENT_CLINIC_OR_DEPARTMENT_OTHER): Payer: Self-pay | Admitting: Cardiovascular Disease

## 2019-07-31 NOTE — Procedures (Signed)
           Patient Name: Katelyn [name], Katelyn [name] Date: 07/21/2019 Gender: Female D.O.B: Oct 14, 1955 Age (years): 82 Referring Provider: Shirlee More Height (inches): 3 Interpreting Physician: Shelva Majestic MD, ABSM Weight (lbs): 210 RPSGT: Jacolyn Reedy BMI: 38 MRN: [id number] Neck Size: 14.50  CLINICAL INFORMATION Sleep Study Type: HST  Indication for sleep study: OSA  Epworth Sleepiness Score: 13  SLEEP STUDY TECHNIQUE A multi-channel overnight portable sleep study was performed. The channels recorded were: nasal airflow, thoracic respiratory movement, and oxygen saturation with a pulse oximetry. Snoring was also monitored.  MEDICATIONS     aspirin 81 MG tablet             carvedilol (COREG) 6.25 MG tablet         DULoxetine (CYMBALTA) 60 MG capsule         GLUCOSAMINE-CHONDROITIN PO         insulin NPH-regular Human (NOVOLIN 70/30) (70-30) 100 UNIT/ML injection         mirtazapine (REMERON) 15 MG tablet         Multiple Vitamin (MULTIVITAMIN) tablet         nitroGLYCERIN (NITROSTAT) 0.4 MG SL tablet         ONETOUCH VERIO test strip         oxybutynin (DITROPAN) 5 MG tablet         pantoprazole (PROTONIX) 40 MG tablet         pramipexole (MIRAPEX) 1 MG tablet         torsemide (DEMADEX) 20 MG tablet         TURMERIC CURCUMIN PO         valACYclovir (VALTREX) 1000 MG tablet      Patient self administered medications include: N/A.  SLEEP ARCHITECTURE Patient was studied for 481.9 minutes. The sleep efficiency was 100.0 % and the patient was supine for 75.5%. The arousal index was 0.0 per hour.  RESPIRATORY PARAMETERS The overall AHI was 32.2 per hour, with a central apnea index of 0.0 per hour.  The oxygen nadir was 64% during sleep. Time spent < 89% was 5.50 minutes.  CARDIAC DATA Mean heart rate during sleep was 72.7 bpm.  IMPRESSIONS - Severe obstructive sleep apnea occurred during this study (AHI 32.2/h). - No significant central sleep apnea occurred  during this study (CAI = 0.0/h). - Severe oxygen desaturation to a nadir of 64%). - Patient snored 5.9% during the sleep.  DIAGNOSIS - Obstructive Sleep Apnea (327.23 [G47.33 ICD-10]) - Nocturnal Hypoxemia (327.26 [G47.36 ICD-10])  RECOMMENDATIONS - In this patient with cardiovascular co-morbidities and severe sleep apnea, recommend an in-lab CPAP titration study for optimal evaluation of her sleep disordered breathing. If unable to perform an in-lab tration then initiate AutoPAP at 7- 18 cm of water. - Effort should be made to optimize nasal and oropharyngeal patency. - Avoid alcohol, sedatives and other CNS depressants that may worsen sleep apnea and disrupt normal sleep architecture. - Sleep hygiene should be reviewed to assess factors that may improve sleep quality. - Weight management and regular exercise should be initiated or continued. - Recommend a download after 30 days and sleep clinic evaluation after 4 weeks of therapy.  [Electronically signed] 07/31/2019 03:19 PM  Shelva Majestic MD, [facility], ABSM Diplomate, American Board of Sleep Medicine   NPI: [id number] [place] PH: [phone]   FX: [phone] [place]

## 2019-08-04 ENCOUNTER — Telehealth: Payer: Self-pay | Admitting: Cardiology

## 2019-08-04 NOTE — Telephone Encounter (Signed)
Has not heard anything about her sleep study

## 2019-08-05 NOTE — Telephone Encounter (Signed)
Please advise of sleep study results from 07/21/2019. Thanks!

## 2019-08-06 NOTE — Telephone Encounter (Signed)
Called patient to advise Dr. Bettina Gavia has reached out to Dr. Claiborne Billings regarding sleep study results and is waiting for a response. Informed her we will update her when we have further information. Patient verbalized understanding. No further questions.

## 2019-08-06 NOTE — Telephone Encounter (Signed)
Patient called back for sleep study report.Marland Kitchen

## 2019-08-08 DIAGNOSIS — G4730 Sleep apnea, unspecified: Secondary | ICD-10-CM | POA: Diagnosis not present

## 2019-08-08 DIAGNOSIS — F329 Major depressive disorder, single episode, unspecified: Secondary | ICD-10-CM | POA: Diagnosis not present

## 2019-08-08 DIAGNOSIS — E1142 Type 2 diabetes mellitus with diabetic polyneuropathy: Secondary | ICD-10-CM | POA: Diagnosis not present

## 2019-08-08 DIAGNOSIS — N1831 Chronic kidney disease, stage 3a: Secondary | ICD-10-CM | POA: Diagnosis not present

## 2019-08-08 DIAGNOSIS — K222 Esophageal obstruction: Secondary | ICD-10-CM | POA: Diagnosis not present

## 2019-08-08 DIAGNOSIS — N200 Calculus of kidney: Secondary | ICD-10-CM | POA: Diagnosis not present

## 2019-08-08 DIAGNOSIS — E1140 Type 2 diabetes mellitus with diabetic neuropathy, unspecified: Secondary | ICD-10-CM | POA: Diagnosis not present

## 2019-08-08 DIAGNOSIS — K760 Fatty (change of) liver, not elsewhere classified: Secondary | ICD-10-CM | POA: Diagnosis not present

## 2019-08-08 DIAGNOSIS — E785 Hyperlipidemia, unspecified: Secondary | ICD-10-CM | POA: Diagnosis not present

## 2019-08-08 DIAGNOSIS — I2510 Atherosclerotic heart disease of native coronary artery without angina pectoris: Secondary | ICD-10-CM | POA: Diagnosis not present

## 2019-08-08 DIAGNOSIS — Z794 Long term (current) use of insulin: Secondary | ICD-10-CM | POA: Diagnosis not present

## 2019-08-11 NOTE — Telephone Encounter (Signed)
Patient called and state that her Diabetic Dr , Dr. Forde Dandy reviewed her sleep study results withe her in the ffice the other day and gave her a copy of the results also and he states that it is very serious and needs to be addressed immediately!

## 2019-08-12 NOTE — Telephone Encounter (Signed)
Patient had home sleep test completed on 07-21-2019 and Dr Claiborne Billings was the reading physician. Patient states she has not heard back regarding results and would like a phone call to discuss.  Thank you

## 2019-08-19 NOTE — Telephone Encounter (Signed)
Patient calle again fpr sleep test results, please call her again.Marland Kitchen

## 2019-08-21 DIAGNOSIS — H5203 Hypermetropia, bilateral: Secondary | ICD-10-CM | POA: Diagnosis not present

## 2019-08-21 DIAGNOSIS — H26493 Other secondary cataract, bilateral: Secondary | ICD-10-CM | POA: Diagnosis not present

## 2019-08-25 ENCOUNTER — Telehealth: Payer: Self-pay | Admitting: Cardiology

## 2019-08-25 DIAGNOSIS — N952 Postmenopausal atrophic vaginitis: Secondary | ICD-10-CM | POA: Diagnosis not present

## 2019-08-25 DIAGNOSIS — N3946 Mixed incontinence: Secondary | ICD-10-CM | POA: Diagnosis not present

## 2019-08-25 NOTE — Telephone Encounter (Signed)
Patient has gone twice to pick up the prescription for pantoprazole (PROTONIX) 40 MG, she went last week and again this week -- each time they are only giving her 5 pille at a time.  She wants to know if we can call and get this straightened out for her.  She said normally she gets 90 at a time

## 2019-08-25 NOTE — Telephone Encounter (Signed)
Left detailed message per DPR on patient's cell phone informing patient that I called the [facility] in [place]. A pharmacy technician, Tammy, explained to me that her protonix prescription was trasnferred on 08/17/2019 and the last time she had it filled it was only for 5 tablets so the refill defaulted to those settings. Tammy is going to fill the remaining 75 tablets for patient to pick up at her earliest convenience. Advised patient to contact our office with any further questions or concerns.

## 2019-08-29 DIAGNOSIS — R05 Cough: Secondary | ICD-10-CM | POA: Diagnosis not present

## 2019-08-29 DIAGNOSIS — R0602 Shortness of breath: Secondary | ICD-10-CM | POA: Diagnosis not present

## 2019-08-29 DIAGNOSIS — R0981 Nasal congestion: Secondary | ICD-10-CM | POA: Diagnosis not present

## 2019-08-29 DIAGNOSIS — Z20828 Contact with and (suspected) exposure to other viral communicable diseases: Secondary | ICD-10-CM | POA: Diagnosis not present

## 2019-08-29 DIAGNOSIS — R519 Headache, unspecified: Secondary | ICD-10-CM | POA: Diagnosis not present

## 2019-08-29 DIAGNOSIS — J0100 Acute maxillary sinusitis, unspecified: Secondary | ICD-10-CM | POA: Diagnosis not present

## 2019-09-09 ENCOUNTER — Other Ambulatory Visit: Payer: Self-pay | Admitting: Cardiovascular Disease

## 2019-09-09 DIAGNOSIS — G4733 Obstructive sleep apnea (adult) (pediatric): Secondary | ICD-10-CM

## 2019-09-10 ENCOUNTER — Telehealth: Payer: Self-pay | Admitting: *Deleted

## 2019-09-10 NOTE — Telephone Encounter (Signed)
Patient informed oh HST results and recommendations. She has agreed to proceed with having CPAP titration. Appointment details given to patient.

## 2019-09-15 NOTE — Progress Notes (Signed)
Cardiology Office Note:    Date:  09/16/2019   ID:  Katelyn [name], DOB 18-Aug-1956, MRN [id number]  PCP:  Algis Greenhouse, MD  Cardiologist:  Shirlee More, MD    Referring MD: Algis Greenhouse, MD    ASSESSMENT:    1. Obstructive sleep apnea (adult) (pediatric)   2. Mild CAD   3. Hypertensive heart disease with heart failure ([place])   4. LBBB (left bundle branch block)    PLAN:    In order of problems listed above:  1. 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 2. Stable having no angina continue medical treatment including aspirin beta-blocker 3. Stable BP at target continue current treatment with your loop diuretic 4. Stable no evidence of cardiomyopathy   Next appointment: 6 months   Medication Adjustments/Labs and Tests Ordered: Current medicines are reviewed at length with the patient today.  Concerns regarding medicines are outlined above.  No orders of the defined types were placed in this encounter.  No orders of the defined types were placed in this encounter.   Chief Complaint  Patient presents with  . Follow-up    after sleep study    History of Present Illness:    Katelyn [name] is a 63 y.o. female with a hx of hypertension mild CAD dyslipidemia left bundle branch block with an ejection fraction of 55 to 60% restrictive lung disease with shortness of breath due to deconditioning  Obesity and statin intolerance   She was last seen 06/11/2019. Compliance with diet, lifestyle and medications: Yes  Sleep study 07/11/2019: IMPRESSIONS - Severe obstructive sleep apnea occurred during this study (AHI 32.2/h). - No significant central sleep apnea occurred during this study (CAI = 0.0/h). - Severe oxygen desaturation to a nadir of 64%). - Patient snored 5.9% during the sleep.  CT chest 06/11/2019: CAC lungs appear normal   Echo 04/28/2019: 1. The left ventricle has normal systolic function with an ejection fraction of 60-65%. The cavity size was normal. There is mildly increased left ventricular wall thickness. Left ventricular diastolic Doppler parameters are consistent with impairedrelaxationand normal LA pressure 2. The right ventricle has normal systolic function. The cavity was normal. There is no increase in right ventricular wall thickness. 3. The aortic root is normal in size and structure.  Lexiscan myoview 04/09/2019: 1. The left ventricle has normal systolic function with an ejection fraction of 60-65%. The cavity size was normal. There is mildly increased left ventricular wall thickness. Left ventricular diastolic Doppler parameters are consistent with impaired  relaxation. 2. The right ventricle has normal systolic function. The cavity was normal. There is no increase in right ventricular wall thickness. 3. The aortic root is normal in size and structure.  She has yet to have her CPAP titration scheduled for next week after COVID-19 test and short quarantine.  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  She had a recent evaluation with her diabetologist A1c remains elevated 9.5% but her lipids are improved cholesterol 189 LDL 119 HDL 52.  She is having no chest pain. Past Medical History:  Diagnosis Date  . Anemia    previous transfusion 2/17  . Angina pectoris ([place]) 11/24/2015  .  Anxiety   . Arthritis   . Benign hypertension 03/14/2016  . Bursitis of right hip   . CAD (coronary artery disease)    Cardiac catheterization June 2014 in [facility] - 50% circumflex stenosis  . CAD in native artery 11/24/2015  . Cannot sleep 12/07/2015  . Chronic kidney disease    does not see nephrologist  . Chronic low back pain without sciatica  03/14/2016  . Complication of anesthesia   . Cough 04/19/2017   Overview:  Last Assessment & Plan:  Formatting of this note may be different from the original. Cough - ? ACE related with AR triggers   Plan  Patient Instructions  Discuss with your primary doctor that lisinopril pain, need making your cough worse. May use Mucinex DM twice daily as needed for cough and congestion Zyrtec 10 mg at bedtime as needed for drainage Saline nasal spray as needed. Lab tests today Activity as tolerated. Follow with Dr. [name] in 3-4 months and As needed   Please contact office for sooner follow up if symptoms do not improve or worsen or seek emergency care   . Depression   . DOE (dyspnea on exertion) 04/19/2017   Overview:  2017:   Last Assessment & Plan:  Formatting of this note may be different from the original. DOE ? Etiology , felt to be component of deconditioning  Workup has been unrevealing with neg VQ scan , PFT and CXR , reported neg card w/up  Would cont exrecise regimen as tolerate  Plan  Patient Instructions  Discuss with your primary doctor that lisinopril pain, need making your cough worse. May use Mucinex DM twice daily as needed for cough and congestion Zyrtec 10 mg at bedtime as needed for drainage Saline nasal spray as needed. Lab tests today Activity as tolerated. Follow with Dr. [name] in 3-4 months and As needed   Please contact office for sooner follow up if symptoms do not improve or worsen or seek emergency care   . Dyspnea    with exertion   " lazy lung" - per  [name] from back issues- 06/2016  . Elevated liver enzymes 12/05/2016  . Essential hypertension   . GERD (gastroesophageal reflux disease)   . Gout 03/14/2016  . Greater trochanteric bursitis of right hip 02/02/2012  . H/O hiatal hernia   . Heart murmur   . History of blood transfusion 2016  . History of kidney stones   . Hypercholesterolemia   . Hyperlipemia 01/01/2017  . Iliotibial band syndrome of right side 02/02/2012  . Iron  deficiency anemia due to chronic blood loss 03/14/2016  . LBBB (left bundle branch block) 01/01/2017  . Left bundle branch block   . Lumbar stenosis   . Meralgia paraesthetica 12/05/2016  . Neuropathy   . OSA (obstructive sleep apnea) 05/24/2016   Overview:  Managed PULM  . PONV (postoperative nausea and vomiting)    "no N/V with patch"  . Postprocedural state 11/26/2015  . 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[place]) 12/07/2015  . Restless leg 12/07/2015  . Restless leg syndrome   . 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   Overview:  2017: dx  . S/P lumbar laminectomy 11/26/2015  . S/P lumbar spinal fusion 08/29/2016  . Sleep apnea    mild case  no cpap  . Tinnitus 12/05/2016  . Type 2 diabetes mellitus ([place])   . Type 2 diabetes mellitus without complication, with long-term current use of insulin ([place]) 03/14/2016   Overview:  Managed  ENDO    Past Surgical History:  Procedure Laterality Date  . ABDOMINAL HYSTERECTOMY  1983  . APPENDECTOMY  Age 22  . BACK SURGERY     FIRST LUMBAR FUSION/ SURGERY APRIL 2012 AND FUSION WITH INSTRUMENTATION SEPT 2012  . CARDIAC CATHETERIZATION     x 2  . CARPAL TUNNEL RELEASE     bil  . CHOLECYSTECTOMY  1990's  . CYSTO EXTRACTION KIDNEY STONES    . EXCISION/RELEASE BURSA HIP  02/02/2012   Procedure: EXCISION/RELEASE BURSA HIP;  Surgeon: Tobi Bastos, MD;  Location: [facility];  Service: Orthopedics;  Laterality: Right;  Right Hip Bursectomy  . EYE SURGERY Bilateral    cataracts  . KIDNEY STONE SURGERY  2008  . Left knee surgery x 2  1996   reconstruction  . LUMBAR DISC SURGERY  08/2016  . LUMBAR LAMINECTOMY/DECOMPRESSION MICRODISCECTOMY Right 11/26/2015   Procedure: Extraforaminal Microdiscectomy  - Lumbar two-three- right;  Surgeon: Eustace Moore, MD;  Location: [facility];  Service: Neurosurgery;  Laterality: Right;  right   . LUMBAR WOUND DEBRIDEMENT N/A 10/25/2016   Procedure: Lumbar wound revision;  Surgeon: Eustace Moore, MD;  Location: [facility];   Service: Neurosurgery;  Laterality: N/A;  Lumbar wound revision  . Right shoulder surgery  2010   spur    Current Medications: Current Meds  Medication Sig  . aspirin 81 MG tablet Take 81 mg by mouth at bedtime.   . carvedilol (COREG) 6.25 MG tablet Take 6.25 mg by mouth 2 (two) times daily.   . DULoxetine (CYMBALTA) 60 MG capsule Take 60 mg by mouth at bedtime.   Marland Kitchen GLUCOSAMINE-CHONDROITIN PO Take 1 tablet by mouth daily.  . insulin NPH-regular Human (NOVOLIN 70/30) (70-30) 100 UNIT/ML injection Inject 100 Units into the skin 3 (three) times daily.   . mirtazapine (REMERON) 15 MG tablet Take 15 mg by mouth at bedtime.  . Multiple Vitamin (MULTIVITAMIN) tablet Take 1 tablet by mouth daily.  . nitroGLYCERIN (NITROSTAT) 0.4 MG SL tablet Place 0.4 mg under the tongue every 5 (five) minutes as needed for chest pain.   Glory Rosebush VERIO test strip USE 1 STRIP TO CHECK GLUCOSE 4 TIMES DAILY AS DIRECTED  . oxybutynin (DITROPAN) 5 MG tablet Take 5 mg by mouth daily.  . pantoprazole (PROTONIX) 40 MG tablet Take 1 tablet by mouth once daily  . pramipexole (MIRAPEX) 1 MG tablet Take 1 mg by mouth at bedtime.  . torsemide (DEMADEX) 20 MG tablet Take 40 mg by mouth daily.   . TURMERIC CURCUMIN PO Take 1 tablet by mouth daily.  . valACYclovir (VALTREX) 1000 MG tablet Take 1,000 mg by mouth 2 (two) times daily as needed.   Current Facility-Administered Medications for the 09/16/19 encounter (Office Visit) with Richardo Priest, MD  Medication  . triamcinolone acetonide (KENALOG) 10 MG/ML injection 10 mg  . triamcinolone acetonide (KENALOG) 10 MG/ML injection 10 mg     Allergies:   Atorvastatin, [place] [pentazocine], and Other   Social History   Socioeconomic History  . Marital status: Married    Spouse name: Not on file  . Number of children: Not on file  . Years of education: Not on file  . Highest education level: Not on file  Occupational History  . Occupation: Archivist   Social Needs  . Financial resource strain: Not on file  . Food insecurity    Worry: Not on file    Inability: Not on file  .  Transportation needs    Medical: Not on file    Non-medical: Not on file  Tobacco Use  . Smoking status: Never Smoker  . Smokeless tobacco: Never Used  Substance and Sexual Activity  . Alcohol use: Yes    Alcohol/week: 0.0 standard drinks    Comment: Rarely  . Drug use: No  . Sexual activity: Not on file  Lifestyle  . Physical activity    Days per week: Not on file    Minutes per session: Not on file  . Stress: Not on file  Relationships  . Social Herbalist on phone: Not on file    Gets together: Not on file    Attends religious service: Not on file    Active member of club or organization: Not on file    Attends meetings of clubs or organizations: Not on file    Relationship status: Not on file  Other Topics Concern  . Not on file  Social History Narrative  . Not on file     Family History: The patient's family history includes Asthma in her sister; Bone cancer in her sister; Heart failure in her mother; Hypertension in her father; Lung cancer in her father; Stroke in her father. ROS:   Please see the history of present illness.    All other systems reviewed and are negative.  EKGs/Labs/Other Studies Reviewed:    The following studies were reviewed today:   Recent Labs: 06/11/2019: ALT 116; BUN 31; Creatinine, Ser 1.10; Hemoglobin 13.2; NT-Pro BNP 33; Platelets 234; Potassium 4.3; Sodium 139  Recent Lipid Panel No results found for: CHOL, TRIG, HDL, CHOLHDL, VLDL, LDLCALC, LDLDIRECT  Physical Exam:    VS:  BP 122/64   Pulse 71   Ht 5\' 2"  (1.575 m)   Wt 214 lb (97.1 kg)   SpO2 95%   BMI 39.14 kg/m     Wt Readings from Last 3 Encounters:  09/16/19 214 lb (97.1 kg)  07/21/19 210 lb (95.3 kg)  06/11/19 214 lb 8 oz (97.3 kg)     GEN:  Well nourished, well developed in no acute distress HEENT: Normal NECK: No JVD; No  carotid bruits LYMPHATICS: No lymphadenopathy CARDIAC: RRR, no murmurs, rubs, gallops RESPIRATORY:  Clear to auscultation without rales, wheezing or rhonchi  ABDOMEN: Soft, non-tender, non-distended MUSCULOSKELETAL:  No edema; No deformity  SKIN: Warm and dry NEUROLOGIC:  Alert and oriented x 3 PSYCHIATRIC:  Normal affect    Signed, Shirlee More, MD  09/16/2019 2:04 PM    [place]

## 2019-09-16 ENCOUNTER — Other Ambulatory Visit: Payer: Self-pay

## 2019-09-16 ENCOUNTER — Encounter: Payer: Self-pay | Admitting: Cardiology

## 2019-09-16 ENCOUNTER — Ambulatory Visit (INDEPENDENT_AMBULATORY_CARE_PROVIDER_SITE_OTHER): Payer: PPO | Admitting: Cardiology

## 2019-09-16 VITALS — BP 122/64 | HR 71 | Ht 62.0 in | Wt 214.0 lb

## 2019-09-16 DIAGNOSIS — G4733 Obstructive sleep apnea (adult) (pediatric): Secondary | ICD-10-CM

## 2019-09-16 DIAGNOSIS — I110 Hypertensive heart disease with heart failure: Secondary | ICD-10-CM | POA: Diagnosis not present

## 2019-09-16 DIAGNOSIS — I447 Left bundle-branch block, unspecified: Secondary | ICD-10-CM

## 2019-09-16 DIAGNOSIS — I2510 Atherosclerotic heart disease of native coronary artery without angina pectoris: Secondary | ICD-10-CM

## 2019-09-16 NOTE — Patient Instructions (Signed)
Medication Instructions:  Your physician recommends that you continue on your current medications as directed. Please refer to the Current Medication list given to you today.  *If you need a refill on your cardiac medications before your next appointment, please call your pharmacy*  Lab Work: None  If you have labs (blood work) drawn today and your tests are completely normal, you will receive your results only by: Marland Kitchen MyChart Message (if you have MyChart) OR . A paper copy in the mail If you have any lab test that is abnormal or we need to change your treatment, we will call you to review the results.  Testing/Procedures: None  Follow-Up: At [facility], you and your health needs are our priority.  As part of our continuing mission to provide you with exceptional heart care, we have created designated Provider Care Teams.  These Care Teams include your primary Cardiologist (physician) and Advanced Practice Providers (APPs -  Physician Assistants and Nurse Practitioners) who all work together to provide you with the care you need, when you need it.  Your next appointment:   6 month(s)  The format for your next appointment:   In Person  Provider:   Shirlee More, MD

## 2019-09-18 ENCOUNTER — Other Ambulatory Visit (HOSPITAL_COMMUNITY)
Admission: RE | Admit: 2019-09-18 | Discharge: 2019-09-18 | Disposition: A | Discharge status: Home / Self Care | Payer: PPO | Source: Ambulatory Visit | Attending: Cardiovascular Disease | Admitting: Cardiovascular Disease

## 2019-09-18 DIAGNOSIS — Z01812 Encounter for preprocedural laboratory examination: Secondary | ICD-10-CM | POA: Diagnosis not present

## 2019-09-18 DIAGNOSIS — Z20828 Contact with and (suspected) exposure to other viral communicable diseases: Secondary | ICD-10-CM | POA: Insufficient documentation

## 2019-09-18 LAB — SARS CORONAVIRUS 2 (TAT 6-24 HRS): SARS Coronavirus 2: NEGATIVE

## 2019-09-21 ENCOUNTER — Ambulatory Visit (HOSPITAL_BASED_OUTPATIENT_CLINIC_OR_DEPARTMENT_OTHER): Payer: PPO | Attending: Cardiovascular Disease | Admitting: Cardiovascular Disease

## 2019-09-21 ENCOUNTER — Other Ambulatory Visit: Payer: Self-pay

## 2019-09-21 DIAGNOSIS — Z79899 Other long term (current) drug therapy: Secondary | ICD-10-CM | POA: Diagnosis not present

## 2019-09-21 DIAGNOSIS — Z7982 Long term (current) use of aspirin: Secondary | ICD-10-CM | POA: Insufficient documentation

## 2019-09-21 DIAGNOSIS — Z794 Long term (current) use of insulin: Secondary | ICD-10-CM | POA: Diagnosis not present

## 2019-09-21 DIAGNOSIS — G4733 Obstructive sleep apnea (adult) (pediatric): Secondary | ICD-10-CM | POA: Diagnosis not present

## 2019-09-22 ENCOUNTER — Other Ambulatory Visit (HOSPITAL_BASED_OUTPATIENT_CLINIC_OR_DEPARTMENT_OTHER): Payer: Self-pay

## 2019-09-22 DIAGNOSIS — G4733 Obstructive sleep apnea (adult) (pediatric): Secondary | ICD-10-CM

## 2019-09-28 ENCOUNTER — Encounter (HOSPITAL_BASED_OUTPATIENT_CLINIC_OR_DEPARTMENT_OTHER): Payer: Self-pay | Admitting: Cardiovascular Disease

## 2019-09-28 NOTE — Procedures (Signed)
Patient Name: Katelyn [name], Katelyn [name] Date: 09/21/2019 Gender: Female D.O.B: 19-Nov-1955 Age (years): 56 Referring Provider: Shirlee More Height (inches): 62 Interpreting Physician: Shelva Majestic MD, ABSM Weight (lbs): 210 RPSGT: Gwenyth Allegra BMI: 38 MRN: [id number] Neck Size: 14.50  CLINICAL INFORMATION The patient is referred for a BiPAP titration to treat sleep apnea.  Date of HST: 07/21/2019: AHI 32.2/h; O2 nadir 64%.  SLEEP STUDY TECHNIQUE As per the AASM Manual for the Scoring of Sleep and Associated Events v2.3 (April 2016) with a hypopnea requiring 4% desaturations.  The channels recorded and monitored were frontal, central and occipital EEG, electrooculogram (EOG), submentalis EMG (chin), nasal and oral airflow, thoracic and abdominal wall motion, anterior tibialis EMG, snore microphone, electrocardiogram, and pulse oximetry. Bilevel positive airway pressure (BPAP) was initiated at the beginning of the study and titrated to treat sleep-disordered breathing.  MEDICATIONS aspirin 81 MG tablet  carvedilol (COREG) 6.25 MG tablet  DULoxetine (CYMBALTA) 60 MG capsule  GLUCOSAMINE-CHONDROITIN PO  insulin NPH-regular Human (NOVOLIN 70/30) (70-30) 100 UNIT/ML injection  mirtazapine (REMERON) 15 MG tablet  Multiple Vitamin (MULTIVITAMIN) tablet  nitroGLYCERIN (NITROSTAT) 0.4 MG SL tablet  ONETOUCH VERIO test strip  oxybutynin (DITROPAN) 5 MG tablet  pantoprazole (PROTONIX) 40 MG tablet  pramipexole (MIRAPEX) 1 MG tablet  torsemide (DEMADEX) 20 MG tablet  TURMERIC CURCUMIN PO  valACYclovir (VALTREX) 1000 MG tablet  Medications self-administered by patient taken the night of the study : N/A  RESPIRATORY PARAMETERS Optimal IPAP Pressure (cm): 23 AHI at Optimal Pressure (/hr) 4.2 Optimal EPAP Pressure (cm): 19   Overall Minimal O2 (%): 89.0 Minimal O2 at Optimal Pressure (%): 92.0  SLEEP ARCHITECTURE Start Time: 9:38:17 PM Stop Time: 5:03:41 AM Total Time  (min): 445.4 Total Sleep Time (min): 392.5 Sleep Latency (min): 1.8 Sleep Efficiency (%): 88.1% REM Latency (min): 391.5 WASO (min): 51.1 Stage N1 (%): 8.7% Stage N2 (%): 78.7% Stage N3 (%): 0.0% Stage R (%): 12.6 Supine (%): 27.70 Arousal Index (/hr): 8.9   CARDIAC DATA The 2 lead EKG demonstrated sinus rhythm. The mean heart rate was N/A beats per minute. Other EKG findings include: PVCs.  LEG MOVEMENT DATA The total Periodic Limb Movements of Sleep (PLMS) were 0. The PLMS index was 0.0. A PLMS index of <15 is considered normal in adults.  IMPRESSIONS - CPAP was initiated at 6 cm and was titrated to 20 cm with transition to BiPAP at 22/18 to 23/19. AHI at 22/18 was 0 and at 23/19 4.2/h with a central event. - Central sleep apnea was not noted during this titration (CAI = 1.1/h). - Mild oxygen desaturations to a nadir of 89.0% at CPAP 14 cm of water. - The patient snored with moderate snoring volume. - 2-lead EKG demonstrated: PVCs - Clinically significant periodic limb movements were not noted during this study. Arousals associated with PLMs were rare.  DIAGNOSIS - Obstructive Sleep Apnea (327.23 [G47.33 ICD-10])  RECOMMENDATIONS - Recommend an initial trial of BiPAP therapy on 22/18 cm H2O with heated humidification. A Small size Fisher&Paykel Full Face Mask Simplus mask was used for the titration.  - Effort should be made to optimize nasal and oropharyngeal patency. - Avoid alcohol, sedatives and other CNS depressants that may worsen sleep apnea and disrupt normal sleep architecture. - Sleep hygiene should be reviewed to assess factors that may improve sleep quality. - Weight management and regular exercise should be initiated or continued. - Recommend a download in 30 days and sleep Center evaluation after 4 weeks of  therapy  [Electronically signed] 09/28/2019 03:29 PM  Shelva Majestic MD, [facility], ABSM Diplomate, American Board of Sleep Medicine   NPI: [id number] [place] PH: [phone]   FX: [phone] [place]

## 2019-09-29 ENCOUNTER — Telehealth: Payer: Self-pay | Admitting: *Deleted

## 2019-09-29 NOTE — Telephone Encounter (Signed)
Patient notified Sleep study has been completed. Order for BIPAP sent to [facility].

## 2019-10-04 ENCOUNTER — Other Ambulatory Visit: Payer: Self-pay | Admitting: Cardiology

## 2019-10-29 ENCOUNTER — Telehealth: Payer: Self-pay | Admitting: Cardiology

## 2019-10-29 NOTE — Telephone Encounter (Signed)
Patient is calling requesting results from sleep study on 07/21/19. Please call.

## 2019-10-30 NOTE — Telephone Encounter (Signed)
Spoke with patient she was wondering why she has not received her BIPAP machine. I informed her the order was sent to [facility] back in December. She will need to contact them for status. They are probably waiting for everything to clear from her insurance, since she is getting a BIPAP versus a CPAP machine. This typically takes longer to get approved. Patient voiced understanding and she will call [facility] for status update.

## 2019-11-02 ENCOUNTER — Other Ambulatory Visit: Payer: Self-pay | Admitting: Cardiology

## 2019-11-03 DIAGNOSIS — G4733 Obstructive sleep apnea (adult) (pediatric): Secondary | ICD-10-CM | POA: Diagnosis not present

## 2019-11-17 ENCOUNTER — Ambulatory Visit: Payer: PPO | Admitting: Podiatry

## 2019-11-17 ENCOUNTER — Other Ambulatory Visit: Payer: Self-pay

## 2019-11-17 DIAGNOSIS — L600 Ingrowing nail: Secondary | ICD-10-CM | POA: Diagnosis not present

## 2019-11-17 DIAGNOSIS — M79676 Pain in unspecified toe(s): Secondary | ICD-10-CM | POA: Diagnosis not present

## 2019-11-17 NOTE — Patient Instructions (Signed)

## 2019-11-17 NOTE — Progress Notes (Signed)
  Subjective:  Patient ID: Katelyn [name], female    DOB: 30-Dec-1955,  MRN: [id number]  Chief Complaint  Patient presents with  . Nail Problem    Rt hallux medial border x 1 wk; 7/10 stabbign pain -pt denies injury -w/ redness,swellign and bleeding -pt denies warmth Tx: trimming, peroxide, neosproin and epsom salt soaking   . Diabetes    FBS; 165 A1c: 12 PCP: Dough x 1 yr     64 y.o. female presents with the above complaint. History confirmed with patient.   Objective:  Physical Exam: warm, good capillary refill, no trophic changes or ulcerative lesions, normal DP and PT pulses and normal sensory exam.  Painful ingrowing nail at  medial border of the right, hallux; without warmth, erythema or drainage  Assessment:   1. Ingrown nail   2. Pain around toenail    Plan:  Patient was evaluated and treated and all questions answered.  Ingrown Nail, right -Patient elects to proceed with ingrown toenail removal today -Ingrown nail avulsed. See procedure note. -Educated on post-procedure care including soaking. Written instructions provided. -Patient to follow up in 2 weeks for nail check.  Procedure: Avulsion of nail Location: Right 1st toe  Anesthesia: Lidocaine 1% plain; 1.5 mL and Marcaine 0.5% plain; 1.5 mL, digital block. Skin Prep: Betadine. Dressing: Silvadene; telfa; dry, sterile, compression dressing. Technique: Following skin prep, the toe was exsanguinated and a tourniquet was secured at the base of the toe. The nail was freed and avulsed with a hemostat. The ingrown area was curetagged with a curette. The area was cleansed. The tourniquet was then removed and sterile dressing applied. Disposition: Patient tolerated procedure well.   Return in about 2 weeks (around 12/01/2019) for Nail Check.

## 2019-11-25 DIAGNOSIS — N1831 Chronic kidney disease, stage 3a: Secondary | ICD-10-CM | POA: Diagnosis not present

## 2019-11-25 DIAGNOSIS — E785 Hyperlipidemia, unspecified: Secondary | ICD-10-CM | POA: Diagnosis not present

## 2019-11-25 DIAGNOSIS — I700 Atherosclerosis of aorta: Secondary | ICD-10-CM | POA: Diagnosis not present

## 2019-11-25 DIAGNOSIS — I2510 Atherosclerotic heart disease of native coronary artery without angina pectoris: Secondary | ICD-10-CM | POA: Diagnosis not present

## 2019-11-25 DIAGNOSIS — I5189 Other ill-defined heart diseases: Secondary | ICD-10-CM | POA: Diagnosis not present

## 2019-11-25 DIAGNOSIS — G4730 Sleep apnea, unspecified: Secondary | ICD-10-CM | POA: Diagnosis not present

## 2019-11-25 DIAGNOSIS — M109 Gout, unspecified: Secondary | ICD-10-CM | POA: Diagnosis not present

## 2019-11-25 DIAGNOSIS — M5416 Radiculopathy, lumbar region: Secondary | ICD-10-CM | POA: Diagnosis not present

## 2019-11-25 DIAGNOSIS — R7401 Elevation of levels of liver transaminase levels: Secondary | ICD-10-CM | POA: Diagnosis not present

## 2019-11-25 DIAGNOSIS — I130 Hypertensive heart and chronic kidney disease with heart failure and stage 1 through stage 4 chronic kidney disease, or unspecified chronic kidney disease: Secondary | ICD-10-CM

## 2019-11-25 DIAGNOSIS — I5032 Chronic diastolic (congestive) heart failure: Secondary | ICD-10-CM | POA: Diagnosis not present

## 2019-11-25 DIAGNOSIS — E1142 Type 2 diabetes mellitus with diabetic polyneuropathy: Secondary | ICD-10-CM | POA: Diagnosis not present

## 2019-11-25 DIAGNOSIS — E1140 Type 2 diabetes mellitus with diabetic neuropathy, unspecified: Secondary | ICD-10-CM | POA: Diagnosis not present

## 2019-11-25 HISTORY — DX: Hypertensive heart and chronic kidney disease with heart failure and stage 1 through stage 4 chronic kidney disease, or unspecified chronic kidney disease: I13.0

## 2019-12-02 ENCOUNTER — Ambulatory Visit: Payer: PPO | Admitting: Podiatry

## 2019-12-04 DIAGNOSIS — G4733 Obstructive sleep apnea (adult) (pediatric): Secondary | ICD-10-CM | POA: Diagnosis not present

## 2019-12-08 ENCOUNTER — Other Ambulatory Visit: Payer: Self-pay

## 2019-12-08 ENCOUNTER — Ambulatory Visit (INDEPENDENT_AMBULATORY_CARE_PROVIDER_SITE_OTHER): Payer: PPO | Admitting: Podiatry

## 2019-12-08 DIAGNOSIS — L600 Ingrowing nail: Secondary | ICD-10-CM | POA: Diagnosis not present

## 2019-12-08 DIAGNOSIS — M79676 Pain in unspecified toe(s): Secondary | ICD-10-CM

## 2019-12-08 NOTE — Progress Notes (Signed)
  Subjective:  Patient ID: Katelyn [name], female    DOB: 1956-01-26,  MRN: [id number]  Chief Complaint  Patient presents with  . nail check    F/U Rt hallux nail check Pt. states," doing good, skin is just peeling." -pt dneis redness/swellgin/drainage tx: epsom salt and neosporin     64 y.o. female presents for follow up of nail procedure. History confirmed with patient.   Objective:  Physical Exam: Ingrown nail avulsion site: overlying soft crust, no warmth, no drainage and no erythema Assessment:   1. Ingrown nail   2. Pain around toenail      Plan:  Patient was evaluated and treated and all questions answered.  S/p Ingrown Toenail Excision, right -Healing well without issue. -Discussed return precautions. -F/u PRN

## 2019-12-09 ENCOUNTER — Ambulatory Visit: Payer: PPO | Admitting: Podiatry

## 2019-12-15 DIAGNOSIS — Z1231 Encounter for screening mammogram for malignant neoplasm of breast: Secondary | ICD-10-CM | POA: Diagnosis not present

## 2019-12-15 DIAGNOSIS — E1140 Type 2 diabetes mellitus with diabetic neuropathy, unspecified: Secondary | ICD-10-CM | POA: Diagnosis not present

## 2019-12-19 DIAGNOSIS — R921 Mammographic calcification found on diagnostic imaging of breast: Secondary | ICD-10-CM | POA: Diagnosis not present

## 2019-12-25 ENCOUNTER — Other Ambulatory Visit: Payer: Self-pay

## 2019-12-25 ENCOUNTER — Ambulatory Visit: Payer: PPO | Admitting: Cardiovascular Disease

## 2019-12-25 ENCOUNTER — Encounter: Payer: Self-pay | Admitting: Cardiovascular Disease

## 2019-12-25 VITALS — BP 152/76 | HR 92 | Ht 62.0 in | Wt 218.0 lb

## 2019-12-25 DIAGNOSIS — G4719 Other hypersomnia: Secondary | ICD-10-CM | POA: Diagnosis not present

## 2019-12-25 DIAGNOSIS — I2510 Atherosclerotic heart disease of native coronary artery without angina pectoris: Secondary | ICD-10-CM | POA: Diagnosis not present

## 2019-12-25 DIAGNOSIS — Z6839 Body mass index (BMI) 39.0-39.9, adult: Secondary | ICD-10-CM

## 2019-12-25 DIAGNOSIS — I110 Hypertensive heart disease with heart failure: Secondary | ICD-10-CM | POA: Diagnosis not present

## 2019-12-25 DIAGNOSIS — G4733 Obstructive sleep apnea (adult) (pediatric): Secondary | ICD-10-CM | POA: Diagnosis not present

## 2019-12-25 DIAGNOSIS — I447 Left bundle-branch block, unspecified: Secondary | ICD-10-CM | POA: Diagnosis not present

## 2019-12-25 NOTE — Patient Instructions (Signed)
Medication Instructions:  CONTINUE WITH CURRENT MEDICATIONS. NO CHANGES.  *If you need a refill on your cardiac medications before your next appointment, please call your pharmacy*  Follow-Up: At [facility], you and your health needs are our priority.  As part of our continuing mission to provide you with exceptional heart care, we have created designated Provider Care Teams.  These Care Teams include your primary Cardiologist (physician) and Advanced Practice Providers (APPs -  Physician Assistants and Nurse Practitioners) who all work together to provide you with the care you need, when you need it.  We recommend signing up for the patient portal called "MyChart".  Sign up information is provided on this After Visit Summary.  MyChart is used to connect with patients for Virtual Visits (Telemedicine).  Patients are able to view lab/test results, encounter notes, upcoming appointments, etc.  Non-urgent messages can be sent to your provider as well.   To learn more about what you can do with MyChart, go to [email or web address].    Your next appointment: Before April 25th  1 month(s)  The format for your next appointment:   In Person  Provider:   Shelva Majestic, MD

## 2019-12-25 NOTE — Progress Notes (Deleted)
Patient Name: Katelyn [name], Katelyn [name] Date: 09/21/2019 Gender: Female D.O.B: August 06, 1956 Age (years): 82 Referring Provider: Shirlee More Height (inches): 62 Interpreting Physician: Shelva Majestic MD, ABSM Weight (lbs): 210 RPSGT: Gwenyth Allegra BMI: 38 MRN: [id number] Neck Size: 14.50  CLINICAL INFORMATION The patient is referred for a BiPAP titration to treat sleep apnea.  Date of HST: 07/21/2019: AHI 32.2/h; O2 nadir 64%.  SLEEP STUDY TECHNIQUE As per the AASM Manual for the Scoring of Sleep and Associated Events v2.3 (April 2016) with a hypopnea requiring 4% desaturations.  The channels recorded and monitored were frontal, central and occipital EEG, electrooculogram (EOG), submentalis EMG (chin), nasal and oral airflow, thoracic and abdominal wall motion, anterior tibialis EMG, snore microphone, electrocardiogram, and pulse oximetry. Bilevel positive airway pressure (BPAP) was initiated at the beginning of the study and titrated to treat sleep-disordered breathing.  MEDICATIONS aspirin 81 MG tablet  carvedilol (COREG) 6.25 MG tablet  DULoxetine (CYMBALTA) 60 MG capsule  GLUCOSAMINE-CHONDROITIN PO  insulin NPH-regular Human (NOVOLIN 70/30) (70-30) 100 UNIT/ML injection  mirtazapine (REMERON) 15 MG tablet  Multiple Vitamin (MULTIVITAMIN) tablet  nitroGLYCERIN (NITROSTAT) 0.4 MG SL tablet  ONETOUCH VERIO test strip  oxybutynin (DITROPAN) 5 MG tablet  pantoprazole (PROTONIX) 40 MG tablet  pramipexole (MIRAPEX) 1 MG tablet  torsemide (DEMADEX) 20 MG tablet  TURMERIC CURCUMIN PO  valACYclovir (VALTREX) 1000 MG tablet  Medications self-administered by patient taken the night of the study : N/A  RESPIRATORY PARAMETERS Optimal IPAP Pressure (cm): 23 AHI at Optimal Pressure (/hr) 4.2 Optimal EPAP Pressure (cm): 19   Overall Minimal O2 (%): 89.0 Minimal O2 at Optimal Pressure (%): 92.0  SLEEP ARCHITECTURE Start Time: 9:38:17 PM Stop Time: 5:03:41 AM Total Time  (min): 445.4 Total Sleep Time (min): 392.5 Sleep Latency (min): 1.8 Sleep Efficiency (%): 88.1% REM Latency (min): 391.5 WASO (min): 51.1 Stage N1 (%): 8.7% Stage N2 (%): 78.7% Stage N3 (%): 0.0% Stage R (%): 12.6 Supine (%): 27.70 Arousal Index (/hr): 8.9   CARDIAC DATA The 2 lead EKG demonstrated sinus rhythm. The mean heart rate was N/A beats per minute. Other EKG findings include: PVCs.  LEG MOVEMENT DATA The total Periodic Limb Movements of Sleep (PLMS) were 0. The PLMS index was 0.0. A PLMS index of <15 is considered normal in adults.  IMPRESSIONS - CPAP was initiated at 6 cm and was titrated to 20 cm with transition to BiPAP at 22/18 to 23/19. AHI at 22/18 was 0 and at 23/19 4.2/h with a central event. - Central sleep apnea was not noted during this titration (CAI = 1.1/h). - Mild oxygen desaturations to a nadir of 89.0% at CPAP 14 cm of water. - The patient snored with moderate snoring volume. - 2-lead EKG demonstrated: PVCs - Clinically significant periodic limb movements were not noted during this study. Arousals associated with PLMs were rare.  DIAGNOSIS - Obstructive Sleep Apnea (327.23 [G47.33 ICD-10])  RECOMMENDATIONS - Recommend an initial trial of BiPAP therapy on 22/18 cm H2O with heated humidification. A Small size Fisher[employer]Paykel Full Face Mask Simplus mask was used for the titration.  - Effort should be made to optimize nasal and oropharyngeal patency. - Avoid alcohol, sedatives and other CNS depressants that may worsen sleep apnea and disrupt normal sleep architecture. - Sleep hygiene should be reviewed to assess factors that may improve sleep quality. - Weight management and regular exercise should be initiated or continued. - Recommend a download in 30 days and sleep Center evaluation after 4 weeks of  therapy  [Electronically signed] 09/28/2019 03:29 PM  Shelva Majestic MD, [facility], ABSM Diplomate, American Board of Sleep Medicine   NPI: [id number] [place] PH: [phone]   FX: [phone] [place]

## 2019-12-27 ENCOUNTER — Encounter: Payer: Self-pay | Admitting: Cardiovascular Disease

## 2019-12-27 NOTE — Progress Notes (Signed)
Cardiology Office Note    Date:  12/27/2019   ID:  Katelyn, [name] 05-28-1956, MRN [id number]  PCP:  Algis Greenhouse, MD  Cardiologist:  Shelva Majestic, MD (sleep); Dr. Shirlee More  New sleep evaluation  History of Present Illness:  Katelyn [name] is a 64 y.o. female who presents for initial sleep evaluation following the recent diagnosis of severe sleep apnea and CPAP initiation.  Katelyn [name] is followed by Dr. Shirlee More for cardiology care.  She has a history of hypertension, mild coronary artery disease, dyslipidemia, bundle branch block, restrictive lung disease, obesity, and shortness of breath.  Due to concerns for obstructive sleep apnea, she was referred for a home sleep study on July 21, 2019 which revealed severe sleep apnea with an AHI of 32.2/h and severe oxygen desaturation nadir of 64%.  She subsequently was referred for a CPAP titration study which was done on September 21, 2019.  CPAP was initiated at 6 cm and was titrated to 20 cm of water pressure with continued events.  As result, she was transitioned to BiPAP therapy initially at 22/18 and titrated to 23/19.  AHI at 22/18  was 0 and at 23/19  was 4.2./h with a central event. Her BiPAP set up date was November 03, 2019 with [facility] as her DME company.  A download was obtained in the office today from November 24, 2019 through December 23, 2019.  This shows 97% of usage days, however she is meeting compliance by virtue of inadequate sleep duration with average usage at only 3 hours and 1 minute.  At her 22/18 pressure, AHI was 3.3.  The patient states that her symptoms prior to therapy included snoring, frequent awakenings, sleep fragmentation, nocturia, nonrestorative sleep, as well as daytime sleepiness.  She admits to having issues with depression particularly since her only son died in 56 at age 80 from a car accident.  She has had issues with significant obesity and is contemplating gastric sleeve bypass surgery.   Presently she goes to bed 9 and 9:30 PM but wakes up around 3:30 AM and then often goes to recliner to continue to sleep.  She has not been using CPAP in her recliner.  She continues to have significant residual daytime sleepiness.  An Epworth Sleepiness Scale score was calculated in the office today and this endorsed at 15 as shown below:  Epworth Sleepiness Scale: Situation   Chance of Dozing/Sleeping (0 = never , 1 = slight chance , 2 = moderate chance , 3 = high chance )   sitting and reading 3   watching TV 3   sitting inactive in a public place 2   being a passenger in a motor vehicle for an hour or more 2   lying down in the afternoon 3   sitting and talking to someone 1   sitting quietly after lunch (no alcohol) 1   while stopped for a few minutes in traffic as the driver 0   Total Score  15   She denies any bruxism, hypnagogic hallucinations, or cataplectic events.  She presents for her initial sleep relation with me.   Past Medical History:  Diagnosis Date  . Anemia    previous transfusion 2/17  . Angina pectoris ([place]) 11/24/2015  . Anxiety   . Arthritis   . Benign hypertension 03/14/2016  . Bursitis of right hip   . CAD (coronary artery disease)    Cardiac catheterization June 2014 in [place] - 50% circumflex stenosis  . CAD in native artery 11/24/2015  . Cannot sleep 12/07/2015  . Chronic kidney disease    does not see nephrologist  . Chronic low back pain without sciatica 03/14/2016  . Complication of anesthesia   . Cough 04/19/2017   Overview:  Last Assessment & Plan:  Formatting of this note may be different from the original. Cough - ? ACE related with AR triggers   Plan  Patient Instructions  Discuss with your primary doctor that lisinopril pain, need making your cough worse. May use Mucinex DM twice daily as needed for cough and congestion Zyrtec 10 mg at bedtime as needed for drainage Saline nasal spray as needed. Lab tests today Activity as tolerated. Follow with Dr.  [name] in 3-4 months and As needed   Please contact office for sooner follow up if symptoms do not improve or worsen or seek emergency care   . Depression   . DOE (dyspnea on exertion) 04/19/2017   Overview:  2017:   Last Assessment & Plan:  Formatting of this note may be different from the original. DOE ? Etiology , felt to be component of deconditioning  Workup has been unrevealing with neg VQ scan , PFT and CXR , reported neg card w/up  Would cont exrecise regimen as tolerate  Plan  Patient Instructions  Discuss with your primary doctor that lisinopril pain, need making your cough worse. May use Mucinex DM twice daily as needed for cough and congestion Zyrtec 10 mg at bedtime as needed for drainage Saline nasal spray as needed. Lab tests today Activity as tolerated. Follow with Dr. [name] in 3-4 months and As needed   Please contact office for sooner follow up if symptoms do not improve or worsen or seek emergency care   . Dyspnea    with exertion   " lazy lung" - per  [name] from back issues- 06/2016  . Elevated liver enzymes 12/05/2016  . Essential hypertension   . GERD (gastroesophageal reflux disease)   . Gout 03/14/2016  . Greater trochanteric bursitis of right hip 02/02/2012  . H/O hiatal hernia   . Heart murmur   . History of blood transfusion 2016  . History of kidney stones   . Hypercholesterolemia   . Hyperlipemia 01/01/2017  . Iliotibial band syndrome of right side 02/02/2012  . Iron deficiency anemia due to chronic blood loss 03/14/2016  . LBBB (left bundle branch block) 01/01/2017  . Left bundle branch block   . Lumbar stenosis   . Meralgia paraesthetica 12/05/2016  . Neuropathy   . OSA (obstructive sleep apnea) 05/24/2016   Overview:  Managed PULM  . PONV (postoperative nausea and vomiting)    "no N/V with patch"  . Postprocedural state 11/26/2015  . 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[place]) 12/07/2015  . Restless leg 12/07/2015  . Restless leg syndrome   . Restrictive lung disease  04/10/2016   Overview:  2017: dx  . S/P lumbar laminectomy 11/26/2015  . S/P lumbar spinal fusion 08/29/2016  . Sleep apnea    mild case  no cpap  . Tinnitus 12/05/2016  . Type 2 diabetes mellitus ([place])   . Type 2 diabetes mellitus without complication, with long-term current use of insulin ([place]) 03/14/2016   Overview:  Managed ENDO    Past Surgical History:  Procedure Laterality Date  . ABDOMINAL HYSTERECTOMY  1983  . APPENDECTOMY  Age 70  . BACK SURGERY     FIRST LUMBAR  FUSION/ SURGERY APRIL 2012 AND FUSION WITH INSTRUMENTATION SEPT 2012  . CARDIAC CATHETERIZATION     x 2  . CARPAL TUNNEL RELEASE     bil  . CHOLECYSTECTOMY  1990's  . CYSTO EXTRACTION KIDNEY STONES    . EXCISION/RELEASE BURSA HIP  02/02/2012   Procedure: EXCISION/RELEASE BURSA HIP;  Surgeon: Tobi Bastos, MD;  Location: [facility];  Service: Orthopedics;  Laterality: Right;  Right Hip Bursectomy  . EYE SURGERY Bilateral    cataracts  . KIDNEY STONE SURGERY  2008  . Left knee surgery x 2  1996   reconstruction  . LUMBAR DISC SURGERY  08/2016  . LUMBAR LAMINECTOMY/DECOMPRESSION MICRODISCECTOMY Right 11/26/2015   Procedure: Extraforaminal Microdiscectomy  - Lumbar two-three- right;  Surgeon: Eustace Moore, MD;  Location: [facility];  Service: Neurosurgery;  Laterality: Right;  right   . LUMBAR WOUND DEBRIDEMENT N/A 10/25/2016   Procedure: Lumbar wound revision;  Surgeon: Eustace Moore, MD;  Location: [facility];  Service: Neurosurgery;  Laterality: N/A;  Lumbar wound revision  . Right shoulder surgery  2010   spur    Current Medications: Outpatient Medications Prior to Visit  Medication Sig Dispense Refill  . allopurinol (ZYLOPRIM) 100 MG tablet Take 100 mg by mouth daily.    Marland Kitchen aspirin 81 MG tablet Take 81 mg by mouth at bedtime.     . carvedilol (COREG) 6.25 MG tablet Take 6.25 mg by mouth 2 (two) times daily.     . DULoxetine (CYMBALTA) 60 MG capsule Take 60 mg by mouth at bedtime.     Marland Kitchen GLUCOSAMINE-CHONDROITIN PO  Take 1 tablet by mouth daily.    . insulin NPH-regular Human (NOVOLIN 70/30) (70-30) 100 UNIT/ML injection Inject 100 Units into the skin 3 (three) times daily.     . mirtazapine (REMERON) 15 MG tablet Take 15 mg by mouth at bedtime.    . Multiple Vitamin (MULTIVITAMIN) tablet Take 1 tablet by mouth daily.    . nitroGLYCERIN (NITROSTAT) 0.4 MG SL tablet Place 0.4 mg under the tongue every 5 (five) minutes as needed for chest pain.     Glory Rosebush VERIO test strip USE 1 STRIP TO CHECK GLUCOSE 4 TIMES DAILY AS DIRECTED    . oxybutynin (DITROPAN) 5 MG tablet Take 5 mg by mouth daily.    . pantoprazole (PROTONIX) 40 MG tablet Take 1 tablet by mouth once daily 90 tablet 0  . pramipexole (MIRAPEX) 1 MG tablet Take 1 mg by mouth at bedtime.    . torsemide (DEMADEX) 20 MG tablet TAKE 1 TABLET BY MOUTH TWICE DAILY. TAKE ONCE A DAY FOR WEIGHT <200 LBS 180 tablet 1  . TURMERIC CURCUMIN PO Take 1 tablet by mouth daily.    . valACYclovir (VALTREX) 1000 MG tablet Take 1,000 mg by mouth 2 (two) times daily as needed.     Facility-Administered Medications Prior to Visit  Medication Dose Route Frequency Provider Last Rate Last Admin  . triamcinolone acetonide (KENALOG) 10 MG/ML injection 10 mg  10 mg Other Once Elgin, Titorya, DPM      . triamcinolone acetonide (KENALOG) 10 MG/ML injection 10 mg  10 mg Other Once Landis Martins, DPM         Allergies:   Atorvastatin, Talwin [pentazocine], and Other   Social History   Socioeconomic History  . Marital status: Married    Spouse name: Not on file  . Number of children: Not on file  . Years of education: Not on  file  . Highest education level: Not on file  Occupational History  . Occupation: Archivist  Tobacco Use  . Smoking status: Never Smoker  . Smokeless tobacco: Never Used  Substance and Sexual Activity  . Alcohol use: Yes    Alcohol/week: 0.0 standard drinks    Comment: Rarely  . Drug use: No  . Sexual activity: Not on file    Other Topics Concern  . Not on file  Social History Narrative  . Not on file   Social Determinants of Health   Financial Resource Strain:   . Difficulty of Paying Living Expenses:   Food Insecurity:   . Worried About Charity fundraiser in the Last Year:   . Arboriculturist in the Last Year:   Transportation Needs:   . Film/video editor (Medical):   Marland Kitchen Lack of Transportation (Non-Medical):   Physical Activity:   . Days of Exercise per Week:   . Minutes of Exercise per Session:   Stress:   . Feeling of Stress :   Social Connections:   . Frequency of Communication with Friends and Family:   . Frequency of Social Gatherings with Friends and Family:   . Attends Religious Services:   . Active Member of Clubs or Organizations:   . Attends Archivist Meetings:   Marland Kitchen Marital Status:     Social history is notable in that she is married.  Her only son died in 55 at age 59  from a car accident.  She has had issues with depression ever since.  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  She also has been the caretaker for her niece who is a dwarf.  Family History:  The patient's family history includes Asthma in her sister; Bone cancer in her sister; Heart failure in her mother; Hypertension in her father; Lung cancer in her father; Stroke in her father.   Her father died with lung cancer.  Her mother died with CHF and had angina.  She had 4 sisters and one is deceased.  ROS General: Negative; No fevers, chills, or night sweats; positive for morbid obesity HEENT: Negative; No changes in vision or hearing, sinus congestion, difficulty swallowing Pulmonary: Positive for restrictive lung disease with shortness of breath  Cardiovascular: Positive for hypertension, CAD, hyperlipidemia, left bundle branch block GI: Negative; No nausea, vomiting, diarrhea, or abdominal pain GU: Negative; No dysuria, hematuria, or difficulty  voiding Musculoskeletal: Negative; no myalgias, joint pain, or weakness Hematologic/Oncology: Negative; no easy bruising, bleeding Endocrine: Negative; no heat/cold intolerance; no diabetes Neuro: Negative; no changes in balance, headaches Skin: Negative; No rashes or skin lesions Psychiatric: Positive for depression Sleep: Negative; No snoring, daytime sleepiness, hypersomnolence, bruxism, restless legs, hypnogognic hallucinations, no cataplexy Other comprehensive 14 point system review is negative.   PHYSICAL EXAM:   VS:  BP (!) 152/76   Pulse 92   Ht 5' 2"  (1.575 m)   Wt 218 lb (98.9 kg)   SpO2 92%   BMI 39.87 kg/m     Repeat blood pressure by me was 126/78  Wt Readings from Last 3 Encounters:  12/25/19 218 lb (98.9 kg)  09/21/19 214 lb (97.1 kg)  09/16/19 214 lb (97.1 kg)    General: Alert, oriented, no distress.  Skin: normal turgor, no rashes, warm and dry HEENT: Normocephalic, atraumatic. Pupils equal round and reactive to light; sclera anicteric; extraocular muscles intact; Fundi ** Nose without nasal  septal hypertrophy Mouth/Parynx benign; Mallinpatti scale 3 Neck: Thick neck; no JVD, no carotid bruits; normal carotid upstroke Lungs: clear to ausculatation and percussion; no wheezing or rales Chest wall: without tenderness to palpitation Heart: PMI not displaced, RRR, s1 s2 normal, 1/6 systolic murmur, no diastolic murmur, no rubs, gallops, thrills, or heaves Abdomen: Central adiposity soft, nontender; no hepatosplenomehaly, BS+; abdominal aorta nontender and not dilated by palpation. Back: no CVA tenderness Pulses 2+ Musculoskeletal: full range of motion, normal strength, no joint deformities Extremities: no clubbing cyanosis or edema, Homan's sign negative  Neurologic: grossly nonfocal; Cranial nerves grossly wnl Psychologic: Normal mood and affect   Studies/Labs Reviewed:   EKG:  EKG is not ordered today.  I personally reviewed the ECG from June 11, 2019  which showed normal sinus rhythm at 84 bpm, left bundle branch block with repolarization changes.  No ectopy.  Recent Labs: BMP Latest Ref Rng & Units 06/11/2019 05/05/2019 03/14/2019  Glucose 65 - 99 mg/dL 188(H) 410(H) 128(H)  BUN 8 - 27 mg/dL 31(H) 23 26  Creatinine 0.57 - 1.00 mg/dL 1.10(H) 1.26(H) 1.39(H)  BUN/Creat Ratio 12 - 28 28 18 19   Sodium 134 - 144 mmol/L 139 133(L) 142  Potassium 3.5 - 5.2 mmol/L 4.3 4.6 4.4  Chloride 96 - 106 mmol/L 98 93(L) 98  CO2 20 - 29 mmol/L 28 23 27   Calcium 8.7 - 10.3 mg/dL 9.7 9.3 9.7    Hepatic Function Latest Ref Rng & Units 06/11/2019 03/14/2019 01/31/2012  Total Protein 6.0 - 8.5 g/dL 7.6 7.7 7.3  Albumin 3.8 - 4.8 g/dL 4.4 4.3 3.9  AST 0 - 40 IU/L 123(H) 130(H) 52(H)  ALT 0 - 32 IU/L 116(H) 117(H) 56(H)  Alk Phosphatase 39 - 117 IU/L 149(H) 144(H) 75  Total Bilirubin 0.0 - 1.2 mg/dL 0.4 0.5 0.2(L)    CBC Latest Ref Rng & Units 06/11/2019 10/25/2016 08/21/2016  WBC 3.4 - 10.8 x10E3/uL 7.6 5.7 8.2  Hemoglobin 11.1 - 15.9 g/dL 13.2 10.6(L) 11.2(L)  Hematocrit 34.0 - 46.6 % 38.8 32.8(L) 33.7(L)  Platelets 150 - 450 x10E3/uL 234 323 307   Lab Results  Component Value Date   MCV 101 (H) 06/11/2019   MCV 91.4 10/25/2016   MCV 90.6 08/21/2016   No results found for: TSH Lab Results  Component Value Date   HGBA1C 8.7 (H) 08/21/2016     BNP No results found for: BNP  ProBNP    Component Value Date/Time   PROBNP 33 06/11/2019 0924     Lipid Panel  No results found for: CHOL, TRIG, HDL, CHOLHDL, VLDL, LDLCALC, LDLDIRECT, LABVLDL   RADIOLOGY: No results found.   Additional studies/ records that were reviewed today include:                                                        Patient Name: Katelyn [name], Katelyn [name] Date: 09/21/2019 Gender: Female D.O.B: 1956-04-25 Age (years): 42 Referring Provider: Shirlee More Height (inches): 62 Interpreting Physician: Shelva Majestic MD, ABSM Weight (lbs): 210 RPSGT: Gwenyth Allegra BMI:  38 MRN: [id number] Neck Size: 14.50       CLINICAL INFORMATION The patient is referred for a BiPAP titration to treat sleep apnea.  Date of HST: 07/21/2019: AHI 32.2/h; O2 nadir 64%.  SLEEP STUDY TECHNIQUE As per the AASM Manual for the  Scoring of Sleep and Associated Events v2.3 (April 2016) with a hypopnea requiring 4% desaturations.  The channels recorded and monitored were frontal, central and occipital EEG, electrooculogram (EOG), submentalis EMG (chin), nasal and oral airflow, thoracic and abdominal wall motion, anterior tibialis EMG, snore microphone, electrocardiogram, and pulse oximetry. Bilevel positive airway pressure (BPAP) was initiated at the beginning of the study and titrated to treat sleep-disordered breathing.  MEDICATIONS aspirin 81 MG tablet  carvedilol (COREG) 6.25 MG tablet  DULoxetine (CYMBALTA) 60 MG capsule  GLUCOSAMINE-CHONDROITIN PO  insulin NPH-regular Human (NOVOLIN 70/30) (70-30) 100 UNIT/ML injection  mirtazapine (REMERON) 15 MG tablet  Multiple Vitamin (MULTIVITAMIN) tablet  nitroGLYCERIN (NITROSTAT) 0.4 MG SL tablet  ONETOUCH VERIO test strip  oxybutynin (DITROPAN) 5 MG tablet  pantoprazole (PROTONIX) 40 MG tablet  pramipexole (MIRAPEX) 1 MG tablet  torsemide (DEMADEX) 20 MG tablet  TURMERIC CURCUMIN PO  valACYclovir (VALTREX) 1000 MG tablet  Medications self-administered by patient taken the night of the study : N/A  RESPIRATORY PARAMETERS Optimal IPAP Pressure (cm): 23        AHI at Optimal Pressure (/hr) 4.2 Optimal EPAP Pressure (cm):            19          Overall Minimal O2 (%):         89.0     Minimal O2 at Optimal Pressure (%): 92.0  SLEEP ARCHITECTURE Start Time:      9:38:17 PM      Stop Time:       5:03:41 AM      Total Time (min):         445.4   Total Sleep Time (min):      392.5 Sleep Latency (min):   1.8       Sleep Efficiency (%):   88.1%  REM Latency (min):    391.5   WASO (min):  51.1 Stage N1 (%): 8.7%    Stage  N2 (%): 78.7%  Stage N3 (%): 0.0%    Stage R (%):   12.6 Supine (%):     27.70   Arousal Index (/hr):     8.9         CARDIAC DATA The 2 lead EKG demonstrated sinus rhythm. The mean heart rate was N/A beats per minute. Other EKG findings include: PVCs.  LEG MOVEMENT DATA The total Periodic Limb Movements of Sleep (PLMS) were 0. The PLMS index was 0.0. A PLMS index of <15 is considered normal in adults.  IMPRESSIONS - CPAP was initiated at 6 cm and was titrated to 20 cm with transition to BiPAP at 22/18 to 23/19. AHI at 22/18 was 0 and at 23/19 4.2/h with a central event. - Central sleep apnea was not noted during this titration (CAI = 1.1/h). - Mild oxygen desaturations to a nadir of 89.0% at CPAP 14 cm of water. - The patient snored with moderate snoring volume. - 2-lead EKG demonstrated: PVCs - Clinically significant periodic limb movements were not noted during this study. Arousals associated with PLMs were rare.  DIAGNOSIS - Obstructive Sleep Apnea (327.23 [G47.33 ICD-10])  RECOMMENDATIONS - Recommend an initial trial of BiPAP therapy on 22/18 cm H2O with heated humidification. A Small size Fisher&Paykel Full Face Mask Simplus mask was used for the titration.  - Effort should be made to optimize nasal and oropharyngeal patency. - Avoid alcohol, sedatives and other CNS depressants that may worsen sleep apnea and disrupt normal sleep architecture. - Sleep hygiene should  be reviewed to assess factors that may improve sleep quality. - Weight management and regular exercise should be initiated or continued. - Recommend a download in 30 days and sleep Center evaluation after 4 weeks of therapy  ASSESSMENT:    1. Obstructive sleep apnea (adult) (pediatric)   2. Excessive daytime sleepiness   3. Mild CAD   4. LBBB (left bundle branch block)   5. Hypertensive heart disease with heart failure (HCC)   6. Class 2 severe obesity due to excess calories with serious comorbidity and body  mass index (BMI) of 39.0 to 39.9 in adult [facility])     PLAN:  Katelyn [name] is a 64 year old female who has a history of significant obesity, mild CAD, hypertension, depression, chronic left bundle branch block, and restrictive lung disease.  She had significant symptoms of obstructive sleep apnea including snoring, frequent awakenings, nocturia, nonrestorative sleep, and daytime sleepiness.  She ultimately referred for home study with firm severe sleep apnea with an AHI of 32.2/h and he was severe oxygen desaturation to a nadir of 64% which is highly suggestive of very severe sleep apnea during REM sleep.  I reviewed her home study and thoroughly reviewed her CPAP titration evaluation.  She required titration up to 22/18 which revealed excellent response with AHI of 0 and O2 nadir at 94%.  When further titrated to 23/19 she did experience 1 central apneic event and her O2 tear was 92%.  She has been on CPAP therapy since November 03, 2019.  I had a long discussion with her today and in detail reviewed her studies as well as discussed the effects of sleep apnea on normal sleep architecture and its effect on blood pressure, potential nocturnal arrhythmias, increased incidence of atrial fibrillation, as well as potential ischemic issues in the setting of profound hypoxemia both with reference to cardiac and cerebrovascular events.  I discussed with her that the preponderance of REM sleep occurs in the second half of the night.  Present she has been going to bed at an early hour but often when she wakes up she goes to recliner and does not put her CPAP on.  As result in the second half of the night when the preponderance of REM sleep occurs she is not using her CPAP therapy.  Presently she is set at a ramp time of 20 and ramp start at 4.  I will change her ramp EPAP start to 6 and reduce her ramp time to 10.  Changing her from a fixed 22/18 BiPAP mode to an auto mode and will set her EPAP minimum at 16 and increased  pressure support of 5 so that if necessary can achieve a pressure of 25/20 if therapy requires.  I discussed with her at she should not go on a recliner after she wakes up at put her mask back on and go to bed.  We discussed optimal sleep duration at 8 hours.  She continues to have significant daytime sleepiness which undoubtedly is resulting from her minimal usage of therapy at only 3 hours.  We discussed the importance of meeting Medicare compliance within 90 days of set up.  With her set up date on November 03, 2019 she will need to be compliant by February 01, 2020.  I will see her in 1 month for reevaluation.  Time spent: 45 minutes   Medication Adjustments/Labs and Tests Ordered: Current medicines are reviewed at length with the patient today.  Concerns regarding medicines are outlined above.  Medication changes, Labs and Tests ordered today are listed in the Patient Instructions below.  Patient Instructions  Medication Instructions:  CONTINUE WITH CURRENT MEDICATIONS. NO CHANGES.  *If you need a refill on your cardiac medications before your next appointment, please call your pharmacy*  Follow-Up: At [facility], you and your health needs are our priority.  As part of our continuing mission to provide you with exceptional heart care, we have created designated Provider Care Teams.  These Care Teams include your primary Cardiologist (physician) and Advanced Practice Providers (APPs -  Physician Assistants and Nurse Practitioners) who all work together to provide you with the care you need, when you need it.  We recommend signing up for the patient portal called "MyChart".  Sign up information is provided on this After Visit Summary.  MyChart is used to connect with patients for Virtual Visits (Telemedicine).  Patients are able to view lab/test results, encounter notes, upcoming appointments, etc.  Non-urgent messages can be sent to your provider as well.   To learn more about what you can do  with MyChart, go to [email or web address].    Your next appointment: Before April 25th  1 month(s)  The format for your next appointment:   In Person  Provider:   Shelva Majestic, MD        Signed, Shelva Majestic, MD  12/27/2019 1:45 PM    [facility] [address] [place], [place], [place]  [address] Phone: [phone]

## 2020-01-01 DIAGNOSIS — G4733 Obstructive sleep apnea (adult) (pediatric): Secondary | ICD-10-CM | POA: Diagnosis not present

## 2020-01-19 DIAGNOSIS — G2581 Restless legs syndrome: Secondary | ICD-10-CM | POA: Diagnosis not present

## 2020-01-19 DIAGNOSIS — F3340 Major depressive disorder, recurrent, in remission, unspecified: Secondary | ICD-10-CM | POA: Diagnosis not present

## 2020-01-19 DIAGNOSIS — I10 Essential (primary) hypertension: Secondary | ICD-10-CM | POA: Diagnosis not present

## 2020-01-21 ENCOUNTER — Encounter: Payer: Self-pay | Admitting: Cardiovascular Disease

## 2020-01-21 ENCOUNTER — Ambulatory Visit: Payer: PPO | Admitting: Cardiovascular Disease

## 2020-01-21 ENCOUNTER — Other Ambulatory Visit: Payer: Self-pay

## 2020-01-21 VITALS — BP 130/72 | HR 64 | Ht 62.0 in | Wt 218.2 lb

## 2020-01-21 DIAGNOSIS — I2510 Atherosclerotic heart disease of native coronary artery without angina pectoris: Secondary | ICD-10-CM | POA: Diagnosis not present

## 2020-01-21 DIAGNOSIS — G4733 Obstructive sleep apnea (adult) (pediatric): Secondary | ICD-10-CM | POA: Diagnosis not present

## 2020-01-21 DIAGNOSIS — I447 Left bundle-branch block, unspecified: Secondary | ICD-10-CM

## 2020-01-21 DIAGNOSIS — K219 Gastro-esophageal reflux disease without esophagitis: Secondary | ICD-10-CM | POA: Diagnosis not present

## 2020-01-21 DIAGNOSIS — I110 Hypertensive heart disease with heart failure: Secondary | ICD-10-CM

## 2020-01-21 DIAGNOSIS — Z6839 Body mass index (BMI) 39.0-39.9, adult: Secondary | ICD-10-CM | POA: Diagnosis not present

## 2020-01-21 DIAGNOSIS — G4719 Other hypersomnia: Secondary | ICD-10-CM

## 2020-01-21 NOTE — Patient Instructions (Signed)

## 2020-01-21 NOTE — Progress Notes (Signed)
Cardiology Office Note    Date:  01/27/2020   ID:  Katelyn [name], DOB 1955-11-23, MRN [id number]  PCP:  Algis Greenhouse, MD  Cardiologist:  Shelva Majestic, MD (sleep); Dr. Shirlee More  F/U sleep evaluation  History of Present Illness:  Katelyn [name] is a 64 y.o. female who presents for  sleep evaluation following the recent diagnosis of severe sleep apnea and CPAP initiation.  She was initially seen by me 1 month ago and was poorly compliant.  Due to requirement to meet compliance by February 01, 2020 she represents for a follow-up evaluation.  Katelyn [name] is followed by Dr. Shirlee More for cardiology care.  She has a history of hypertension, mild coronary artery disease, dyslipidemia, bundle branch block, restrictive lung disease, obesity, and shortness of breath.  Due to concerns for obstructive sleep apnea, she was referred for a home sleep study on July 21, 2019 which revealed severe sleep apnea with an AHI of 32.2/h and severe oxygen desaturation nadir of 64%.  She subsequently was referred for a CPAP titration study which was done on September 21, 2019.  CPAP was initiated at 6 cm and was titrated to 20 cm of water pressure with continued events.  As result, she was transitioned to BiPAP therapy initially at 22/18 and titrated to 23/19.  AHI at 22/18  was 0 and at 23/19  was 4.2./h with a central event. Her BiPAP set up date was November 03, 2019 with [facility] as her DME company.  A download was obtained in the office today from November 24, 2019 through December 23, 2019.  This shows 97% of usage days, however she is meeting compliance by virtue of inadequate sleep duration with average usage at only 3 hours and 1 minute.  At her 22/18 pressure, AHI was 3.3.  The patient states that her symptoms prior to therapy included snoring, frequent awakenings, sleep fragmentation, nocturia, nonrestorative sleep, as well as daytime sleepiness.  She admits to having issues with depression particularly  since her only son died in 98 at age 33 from a car accident.  She has had issues with significant obesity and is contemplating gastric sleeve bypass surgery.  Presently she goes to bed 9 and 9:30 PM but wakes up around 3:30 AM and then often goes to recliner to continue to sleep.  She has not been using CPAP in her recliner.  She continues to have significant residual daytime sleepiness.  An Epworth Sleepiness Scale score was calculated in the office today and this endorsed at 15 as shown below:  Epworth Sleepiness Scale: Situation   Chance of Dozing/Sleeping (0 = never , 1 = slight chance , 2 = moderate chance , 3 = high chance )   sitting and reading 3   watching TV 3   sitting inactive in a public place 2   being a passenger in a motor vehicle for an hour or more 2   lying down in the afternoon 3   sitting and talking to someone 1   sitting quietly after lunch (no alcohol) 1   while stopped for a few minutes in traffic as the driver 0   Total Score  15   She denied any bruxism, hypnagogic hallucinations, or cataplectic events.  She presents for her initial sleep relation with me.  When I initially saw her, I had a long discussion with her regarding the effects of sleep apnea on normal sleep architecture, its effects on blood  pressure, potential nocturnal arrhythmias, increased incidence of atrial fibrillation as well as potential ischemic issues in the setting of profound hypoxemia with reference to her cardiac and cerebrovascular events.  She was not compliant.  I made changes to her BiPAP settings.  I discussed the importance of meeting compliance within 90 days of set up date.  Over the past month, following her evaluation with me she may incredible strides in improved compliance.  I obtained a new download today which shows 100% usage.  Average usage is 5 hours and 56 minutes.  Her ResMed air curve 10 V auto unit is set at a minimum EPAP pressure of 16 with maximum IPAP pressure of 25.   95th percentile pressure is 23.5/18.5 with a maximum average pressure at 24.4/19.4.  AHI is excellent at 3.1.  She presents for evaluation.   Past Medical History:  Diagnosis Date  . Anemia    previous transfusion 2/17  . Angina pectoris ([place]) 11/24/2015  . Anxiety   . Arthritis   . Benign hypertension 03/14/2016  . Bursitis of right hip   . CAD (coronary artery disease)    Cardiac catheterization June 2014 in [facility] - 50% circumflex stenosis  . CAD in native artery 11/24/2015  . Cannot sleep 12/07/2015  . Chronic kidney disease    does not see nephrologist  . Chronic low back pain without sciatica 03/14/2016  . Complication of anesthesia   . Cough 04/19/2017   Overview:  Last Assessment & Plan:  Formatting of this note may be different from the original. Cough - ? ACE related with AR triggers   Plan  Patient Instructions  Discuss with your primary doctor that lisinopril pain, need making your cough worse. May use Mucinex DM twice daily as needed for cough and congestion Zyrtec 10 mg at bedtime as needed for drainage Saline nasal spray as needed. Lab tests today Activity as tolerated. Follow with Dr. [name] in 3-4 months and As needed   Please contact office for sooner follow up if symptoms do not improve or worsen or seek emergency care   . Depression   . DOE (dyspnea on exertion) 04/19/2017   Overview:  2017:   Last Assessment & Plan:  Formatting of this note may be different from the original. DOE ? Etiology , felt to be component of deconditioning  Workup has been unrevealing with neg VQ scan , PFT and CXR , reported neg card w/up  Would cont exrecise regimen as tolerate  Plan  Patient Instructions  Discuss with your primary doctor that lisinopril pain, need making your cough worse. May use Mucinex DM twice daily as needed for cough and congestion Zyrtec 10 mg at bedtime as needed for drainage Saline nasal spray as needed. Lab tests today Activity as tolerated. Follow with Dr. [name] in 3-4 months  and As needed   Please contact office for sooner follow up if symptoms do not improve or worsen or seek emergency care   . Dyspnea    with exertion   " lazy lung" - per  [name] from back issues- 06/2016  . Elevated liver enzymes 12/05/2016  . Essential hypertension   . GERD (gastroesophageal reflux disease)   . Gout 03/14/2016  . Greater trochanteric bursitis of right hip 02/02/2012  . H/O hiatal hernia   . Heart murmur   . History of blood transfusion 2016  . History of kidney stones   . Hypercholesterolemia   . Hyperlipemia 01/01/2017  . Iliotibial band  syndrome of right side 02/02/2012  . Iron deficiency anemia due to chronic blood loss 03/14/2016  . LBBB (left bundle branch block) 01/01/2017  . Left bundle branch block   . Lumbar stenosis   . Meralgia paraesthetica 12/05/2016  . Neuropathy   . OSA (obstructive sleep apnea) 05/24/2016   Overview:  Managed PULM  . PONV (postoperative nausea and vomiting)    "no N/V with patch"  . Postprocedural state 11/26/2015  . 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[name]) 12/07/2015  . Restless leg 12/07/2015  . Restless leg syndrome   . 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   Overview:  2017: dx  . S/P lumbar laminectomy 11/26/2015  . S/P lumbar spinal fusion 08/29/2016  . Sleep apnea    mild case  no cpap  . Tinnitus 12/05/2016  . Type 2 diabetes mellitus ([place])   . Type 2 diabetes mellitus without complication, with long-term current use of insulin ([place]) 03/14/2016   Overview:  Managed ENDO    Past Surgical History:  Procedure Laterality Date  . ABDOMINAL HYSTERECTOMY  1983  . APPENDECTOMY  Age 53  . BACK SURGERY     FIRST LUMBAR FUSION/ SURGERY APRIL 2012 AND FUSION WITH INSTRUMENTATION SEPT 2012  . CARDIAC CATHETERIZATION     x 2  . CARPAL TUNNEL RELEASE     bil  . CHOLECYSTECTOMY  1990's  . CYSTO EXTRACTION KIDNEY STONES    . EXCISION/RELEASE BURSA HIP  02/02/2012   Procedure: EXCISION/RELEASE BURSA HIP;  Surgeon: Tobi Bastos, MD;   Location: [facility];  Service: Orthopedics;  Laterality: Right;  Right Hip Bursectomy  . EYE SURGERY Bilateral    cataracts  . KIDNEY STONE SURGERY  2008  . Left knee surgery x 2  1996   reconstruction  . LUMBAR DISC SURGERY  08/2016  . LUMBAR LAMINECTOMY/DECOMPRESSION MICRODISCECTOMY Right 11/26/2015   Procedure: Extraforaminal Microdiscectomy  - Lumbar two-three- right;  Surgeon: Eustace Moore, MD;  Location: [facility];  Service: Neurosurgery;  Laterality: Right;  right   . LUMBAR WOUND DEBRIDEMENT N/A 10/25/2016   Procedure: Lumbar wound revision;  Surgeon: Eustace Moore, MD;  Location: [facility];  Service: Neurosurgery;  Laterality: N/A;  Lumbar wound revision  . Right shoulder surgery  2010   spur    Current Medications: Outpatient Medications Prior to Visit  Medication Sig Dispense Refill  . allopurinol (ZYLOPRIM) 100 MG tablet Take 100 mg by mouth daily.    Marland Kitchen aspirin 81 MG tablet Take 81 mg by mouth at bedtime.     . carvedilol (COREG) 6.25 MG tablet Take 6.25 mg by mouth 2 (two) times daily.     . DULoxetine (CYMBALTA) 60 MG capsule Take 60 mg by mouth at bedtime.     Marland Kitchen GLUCOSAMINE-CHONDROITIN PO Take 1 tablet by mouth daily.    . insulin NPH-regular Human (NOVOLIN 70/30) (70-30) 100 UNIT/ML injection Inject 100 Units into the skin 3 (three) times daily.     . mirtazapine (REMERON) 15 MG tablet Take 15 mg by mouth at bedtime.    . Multiple Vitamin (MULTIVITAMIN) tablet Take 1 tablet by mouth daily.    . nitroGLYCERIN (NITROSTAT) 0.4 MG SL tablet Place 0.4 mg under the tongue every 5 (five) minutes as needed for chest pain.     Glory Rosebush VERIO test strip USE 1 STRIP TO CHECK GLUCOSE 4 TIMES DAILY AS DIRECTED    . oxybutynin (DITROPAN) 5 MG tablet Take 5 mg by mouth daily.    Marland Kitchen  pantoprazole (PROTONIX) 40 MG tablet Take 1 tablet by mouth once daily 90 tablet 0  . pramipexole (MIRAPEX) 1 MG tablet Take 1 mg by mouth at bedtime.    . torsemide (DEMADEX) 20 MG tablet TAKE 1 TABLET BY  MOUTH TWICE DAILY. TAKE ONCE A DAY FOR WEIGHT <200 LBS 180 tablet 1  . TURMERIC CURCUMIN PO Take 1 tablet by mouth daily.    . valACYclovir (VALTREX) 1000 MG tablet Take 1,000 mg by mouth 2 (two) times daily as needed.     Facility-Administered Medications Prior to Visit  Medication Dose Route Frequency Provider Last Rate Last Admin  . triamcinolone acetonide (KENALOG) 10 MG/ML injection 10 mg  10 mg Other Once Pleak, Titorya, DPM      . triamcinolone acetonide (KENALOG) 10 MG/ML injection 10 mg  10 mg Other Once Landis Martins, DPM         Allergies:   Atorvastatin, Talwin [pentazocine], and Other   Social History   Socioeconomic History  . Marital status: Married    Spouse name: Not on file  . Number of children: Not on file  . Years of education: Not on file  . Highest education level: Not on file  Occupational History  . Occupation: Archivist  Tobacco Use  . Smoking status: Never Smoker  . Smokeless tobacco: Never Used  Substance and Sexual Activity  . Alcohol use: Yes    Alcohol/week: 0.0 standard drinks    Comment: Rarely  . Drug use: No  . Sexual activity: Not on file  Other Topics Concern  . Not on file  Social History Narrative  . Not on file   Social Determinants of Health   Financial Resource Strain:   . Difficulty of Paying Living Expenses:   Food Insecurity:   . Worried About Charity fundraiser in the Last Year:   . Arboriculturist in the Last Year:   Transportation Needs:   . Film/video editor (Medical):   Marland Kitchen Lack of Transportation (Non-Medical):   Physical Activity:   . Days of Exercise per Week:   . Minutes of Exercise per Session:   Stress:   . Feeling of Stress :   Social Connections:   . Frequency of Communication with Friends and Family:   . Frequency of Social Gatherings with Friends and Family:   . Attends Religious Services:   . Active Member of Clubs or Organizations:   . Attends Archivist Meetings:    Marland Kitchen Marital Status:     Social history is notable in that she is married.  Her only son died in 74 at age 74  from a car accident.  She has had issues with depression ever since.  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  She also has been the caretaker for her niece who is a dwarf.  Family History:  The patient's family history includes Asthma in her sister; Bone cancer in her sister; Heart failure in her mother; Hypertension in her father; Lung cancer in her father; Stroke in her father.   Her father died with lung cancer.  Her mother died with CHF and had angina.  She had 4 sisters and one is deceased.  ROS General: Negative; No fevers, chills, or night sweats; positive for morbid obesity HEENT: Negative; No changes in vision or hearing, sinus congestion, difficulty swallowing Pulmonary: Positive for restrictive lung disease with shortness of breath  Cardiovascular: Positive  for hypertension, CAD, hyperlipidemia, left bundle branch block GI: Negative; No nausea, vomiting, diarrhea, or abdominal pain GU: Negative; No dysuria, hematuria, or difficulty voiding Musculoskeletal: Negative; no myalgias, joint pain, or weakness Hematologic/Oncology: Negative; no easy bruising, bleeding Endocrine: Negative; no heat/cold intolerance; no diabetes Neuro: Negative; no changes in balance, headaches Skin: Negative; No rashes or skin lesions Psychiatric: Positive for depression Sleep: Negative; No snoring, daytime sleepiness, hypersomnolence, bruxism, restless legs, hypnogognic hallucinations, no cataplexy Other comprehensive 14 point system review is negative.   PHYSICAL EXAM:   VS:  BP 130/72   Pulse 64   Ht 5' 2"  (1.575 m)   Wt 218 lb 3.2 oz (99 kg)   SpO2 96%   BMI 39.91 kg/m     Repeat blood pressure by me was 128/72  Wt Readings from Last 3 Encounters:  01/21/20 218 lb 3.2 oz (99 kg)  12/25/19 218 lb (98.9 kg)  09/21/19 214 lb (97.1 kg)       Physical Exam BP 130/72   Pulse 64   Ht 5' 2"  (1.575 m)   Wt 218 lb 3.2 oz (99 kg)   SpO2 96%   BMI 39.91 kg/m  General: Alert, oriented, no distress.  Skin: normal turgor, no rashes, warm and dry HEENT: Normocephalic, atraumatic. Pupils equal round and reactive to light; sclera anicteric; extraocular muscles intact;  Nose without nasal septal hypertrophy Mouth/Parynx benign; Mallinpatti scale 3 Neck: No JVD, no carotid bruits; normal carotid upstroke Lungs: clear to ausculatation and percussion; no wheezing or rales Chest wall: without tenderness to palpitation Heart: PMI not displaced, RRR, s1 s2 normal, 1/6 systolic murmur, no diastolic murmur, no rubs, gallops, thrills, or heaves Abdomen: Central adiposity;soft, nontender; no hepatosplenomehaly, BS+; abdominal aorta nontender and not dilated by palpation. Back: no CVA tenderness Pulses 2+ Musculoskeletal: full range of motion, normal strength, no joint deformities Extremities: no clubbing cyanosis or edema, Homan's sign negative  Neurologic: grossly nonfocal; Cranial nerves grossly wnl Psychologic: Normal mood and affect   Studies/Labs Reviewed:   EKG:  EKG is not ordered today.  I personally reviewed the ECG from June 11, 2019 which showed normal sinus rhythm at 84 bpm, left bundle branch block with repolarization changes.  No ectopy.  Recent Labs: BMP Latest Ref Rng & Units 06/11/2019 05/05/2019 03/14/2019  Glucose 65 - 99 mg/dL 188(H) 410(H) 128(H)  BUN 8 - 27 mg/dL 31(H) 23 26  Creatinine 0.57 - 1.00 mg/dL 1.10(H) 1.26(H) 1.39(H)  BUN/Creat Ratio 12 - 28 28 18 19   Sodium 134 - 144 mmol/L 139 133(L) 142  Potassium 3.5 - 5.2 mmol/L 4.3 4.6 4.4  Chloride 96 - 106 mmol/L 98 93(L) 98  CO2 20 - 29 mmol/L 28 23 27   Calcium 8.7 - 10.3 mg/dL 9.7 9.3 9.7    Hepatic Function Latest Ref Rng & Units 06/11/2019 03/14/2019 01/31/2012  Total Protein 6.0 - 8.5 g/dL 7.6 7.7 7.3  Albumin 3.8 - 4.8 g/dL 4.4 4.3 3.9  AST 0 - 40 IU/L  123(H) 130(H) 52(H)  ALT 0 - 32 IU/L 116(H) 117(H) 56(H)  Alk Phosphatase 39 - 117 IU/L 149(H) 144(H) 75  Total Bilirubin 0.0 - 1.2 mg/dL 0.4 0.5 0.2(L)    CBC Latest Ref Rng & Units 06/11/2019 10/25/2016 08/21/2016  WBC 3.4 - 10.8 x10E3/uL 7.6 5.7 8.2  Hemoglobin 11.1 - 15.9 g/dL 13.2 10.6(L) 11.2(L)  Hematocrit 34.0 - 46.6 % 38.8 32.8(L) 33.7(L)  Platelets 150 - 450 x10E3/uL 234 323 307   Lab Results  Component  Value Date   MCV 101 (H) 06/11/2019   MCV 91.4 10/25/2016   MCV 90.6 08/21/2016   No results found for: TSH Lab Results  Component Value Date   HGBA1C 8.7 (H) 08/21/2016     BNP No results found for: BNP  ProBNP    Component Value Date/Time   PROBNP 33 06/11/2019 0924     Lipid Panel  No results found for: CHOL, TRIG, HDL, CHOLHDL, VLDL, LDLCALC, LDLDIRECT, LABVLDL   RADIOLOGY: No results found.   Additional studies/ records that were reviewed today include:                                                        Patient Name: Katelyn [name], Katelyn [name] Date: 09/21/2019 Gender: Female D.O.B: [birth year] Age (years): 14 Referring Provider: Shirlee More Height (inches): 62 Interpreting Physician: Shelva Majestic MD, ABSM Weight (lbs): 210 RPSGT: Gwenyth Allegra BMI: 38 MRN: [id number] Neck Size: 14.50       CLINICAL INFORMATION The patient is referred for a BiPAP titration to treat sleep apnea.  Date of HST: 07/21/2019: AHI 32.2/h; O2 nadir 64%.  SLEEP STUDY TECHNIQUE As per the AASM Manual for the Scoring of Sleep and Associated Events v2.3 (April 2016) with a hypopnea requiring 4% desaturations.  The channels recorded and monitored were frontal, central and occipital EEG, electrooculogram (EOG), submentalis EMG (chin), nasal and oral airflow, thoracic and abdominal wall motion, anterior tibialis EMG, snore microphone, electrocardiogram, and pulse oximetry. Bilevel positive airway pressure (BPAP) was initiated at the beginning of the study and  titrated to treat sleep-disordered breathing.  MEDICATIONS aspirin 81 MG tablet  carvedilol (COREG) 6.25 MG tablet  DULoxetine (CYMBALTA) 60 MG capsule  GLUCOSAMINE-CHONDROITIN PO  insulin NPH-regular Human (NOVOLIN 70/30) (70-30) 100 UNIT/ML injection  mirtazapine (REMERON) 15 MG tablet  Multiple Vitamin (MULTIVITAMIN) tablet  nitroGLYCERIN (NITROSTAT) 0.4 MG SL tablet  ONETOUCH VERIO test strip  oxybutynin (DITROPAN) 5 MG tablet  pantoprazole (PROTONIX) 40 MG tablet  pramipexole (MIRAPEX) 1 MG tablet  torsemide (DEMADEX) 20 MG tablet  TURMERIC CURCUMIN PO  valACYclovir (VALTREX) 1000 MG tablet  Medications self-administered by patient taken the night of the study : N/A  RESPIRATORY PARAMETERS Optimal IPAP Pressure (cm): 23        AHI at Optimal Pressure (/hr) 4.2 Optimal EPAP Pressure (cm):            19          Overall Minimal O2 (%):         89.0     Minimal O2 at Optimal Pressure (%): 92.0  SLEEP ARCHITECTURE Start Time:      9:38:17 PM      Stop Time:       5:03:41 AM      Total Time (min):         445.4   Total Sleep Time (min):      392.5 Sleep Latency (min):   1.8       Sleep Efficiency (%):   88.1%  REM Latency (min):    391.5   WASO (min):  51.1 Stage N1 (%): 8.7%    Stage N2 (%): 78.7%  Stage N3 (%): 0.0%    Stage R (%):   12.6 Supine (%):     27.70   Arousal Index (/hr):  8.9         CARDIAC DATA The 2 lead EKG demonstrated sinus rhythm. The mean heart rate was N/A beats per minute. Other EKG findings include: PVCs.  LEG MOVEMENT DATA The total Periodic Limb Movements of Sleep (PLMS) were 0. The PLMS index was 0.0. A PLMS index of <15 is considered normal in adults.  IMPRESSIONS - CPAP was initiated at 6 cm and was titrated to 20 cm with transition to BiPAP at 22/18 to 23/19. AHI at 22/18 was 0 and at 23/19 4.2/h with a central event. - Central sleep apnea was not noted during this titration (CAI = 1.1/h). - Mild oxygen desaturations to a nadir of  89.0% at CPAP 14 cm of water. - The patient snored with moderate snoring volume. - 2-lead EKG demonstrated: PVCs - Clinically significant periodic limb movements were not noted during this study. Arousals associated with PLMs were rare.  DIAGNOSIS - Obstructive Sleep Apnea (327.23 [G47.33 ICD-10])  RECOMMENDATIONS - Recommend an initial trial of BiPAP therapy on 22/18 cm H2O with heated humidification. A Small size Fisher&Paykel Full Face Mask Simplus mask was used for the titration.  - Effort should be made to optimize nasal and oropharyngeal patency. - Avoid alcohol, sedatives and other CNS depressants that may worsen sleep apnea and disrupt normal sleep architecture. - Sleep hygiene should be reviewed to assess factors that may improve sleep quality. - Weight management and regular exercise should be initiated or continued. - Recommend a download in 30 days and sleep Center evaluation after 4 weeks of therapy  ASSESSMENT:    1. OSA (obstructive sleep apnea)   2. Excessive daytime sleepiness   3. Class 2 severe obesity due to excess calories with serious comorbidity and body mass index (BMI) of 39.0 to 39.9 in adult ([place])   4. Hypertensive heart disease with heart failure ([place])   5. LBBB (left bundle branch block)   6. Mild CAD   7. Gastroesophageal reflux disease without esophagitis     PLAN:  Katelyn [name] is a 63 year old female who has a history of significant obesity, mild CAD, hypertension, depression, chronic left bundle branch block, and restrictive lung disease.  She had significant symptoms of obstructive sleep apnea including snoring, frequent awakenings, nocturia, nonrestorative sleep, and daytime sleepiness.  She had an initial home study demonstrated severe sleep apnea with an AHI of 32.2/h and severe oxygen desaturation to a nadir of 64% which is highly suggestive of very severe sleep apnea during REM sleep.  Her initial evaluation I thoroughly reviewed her home study  with her as well as her CPAP/BiPAP titration.   She has been on CPAP therapy since November 03, 2019.  At her initial evaluation I discussed the effects of sleep apnea on normal sleep architecture and its effect on blood pressure, potential nocturnal arrhythmias, increased incidence of atrial fibrillation, as well as potential ischemic issues in the setting of profound hypoxemia both with reference to cardiac and cerebrovascular events.  I discussed with her that the preponderance of REM sleep occurs in the second half of the night.  When I initially saw her she was going to bed at an early hour but when she would wake up she would go to a recliner and not use CPAP.  As a result she was not compliant.  I made adjustments to her settings which now include a minimum EPAP of 16 maximum IPAP of 25.  She has a Artist Simplus mask.  He is  now sleeping significantly improved better.  An Epworth Sleepiness Scale score was recalculated in the office today and this endorsed at 10.  She is now 100% compliant and sleeping approximately 6 hours per night with therapy.  I discussed optimal sleep duration at 8 hours.  Her AHI is very good at 3.2.  She is requiring high pressures with a 95th percentile pressure of 23.5/18.5 and maximum pressure at 24.4/19.4.  Her blood pressure today is improved and stable at 128/72.  She continues to be on carvedilol 6.25 mg twice a day and torsemide.  She is not having any angina or chest pain.  She is diabetic on insulin.  Her GERD is controlled with pantoprazole.  We discussed the importance of weight loss.  BMI is 39.9 on the verge of morbid obesity.  Discussed the importance of exercise she will follow up with Dr. Herbie Baltimore Doe for primary care.  I will see her in 1 year for follow-up sleep evaluation.    Medication Adjustments/Labs and Tests Ordered: Current medicines are reviewed at length with the patient today.  Concerns regarding medicines are outlined above.  Medication  changes, Labs and Tests ordered today are listed in the Patient Instructions below.  Patient Instructions  Medication Instructions:  CONTINUE WITH CURRENT MEDICATIONS. NO CHANGES.  *If you need a refill on your cardiac medications before your next appointment, please call your pharmacy*     Follow-Up: At [facility], you and your health needs are our priority.  As part of our continuing mission to provide you with exceptional heart care, we have created designated Provider Care Teams.  These Care Teams include your primary Cardiologist (physician) and Advanced Practice Providers (APPs -  Physician Assistants and Nurse Practitioners) who all work together to provide you with the care you need, when you need it.  We recommend signing up for the patient portal called "MyChart".  Sign up information is provided on this After Visit Summary.  MyChart is used to connect with patients for Virtual Visits (Telemedicine).  Patients are able to view lab/test results, encounter notes, upcoming appointments, etc.  Non-urgent messages can be sent to your provider as well.   To learn more about what you can do with MyChart, go to [email or web address].    Your next appointment:   12 month(s)  The format for your next appointment:   In Person  Provider:   Shelva Majestic, MD       Signed, Shelva Majestic, MD  01/27/2020 7:01 PM    [facility] [address] [place], [place], [place]  [address] Phone: [phone]

## 2020-01-27 ENCOUNTER — Encounter: Payer: Self-pay | Admitting: Cardiovascular Disease

## 2020-02-01 DIAGNOSIS — G4733 Obstructive sleep apnea (adult) (pediatric): Secondary | ICD-10-CM | POA: Diagnosis not present

## 2020-02-05 ENCOUNTER — Other Ambulatory Visit: Payer: Self-pay | Admitting: Cardiology

## 2020-02-06 NOTE — Telephone Encounter (Signed)
Do you want to continue filling for this patient?

## 2020-02-16 DIAGNOSIS — N3946 Mixed incontinence: Secondary | ICD-10-CM | POA: Diagnosis not present

## 2020-02-16 DIAGNOSIS — N952 Postmenopausal atrophic vaginitis: Secondary | ICD-10-CM | POA: Diagnosis not present

## 2020-03-02 DIAGNOSIS — G4733 Obstructive sleep apnea (adult) (pediatric): Secondary | ICD-10-CM | POA: Diagnosis not present

## 2020-03-03 ENCOUNTER — Other Ambulatory Visit: Payer: Self-pay

## 2020-03-03 NOTE — Progress Notes (Signed)
Cardiology Office Note:    Date:  03/04/2020   ID:  Katelyn [name], DOB April 13, 1956, MRN [id number]  PCP:  Algis Greenhouse, MD  Cardiologist:  Shirlee More, MD    Referring MD: Algis Greenhouse, MD    ASSESSMENT:    1. Hypertensive heart disease with heart failure ([place])   2. Mild CAD   3. LBBB (left bundle branch block)   4. OSA (obstructive sleep apnea)   5. Class 2 severe obesity due to excess calories with serious comorbidity and body mass index (BMI) of 39.0 to 39.9 in adult [facility])    PLAN:    In order of problems listed above:  1. She has a combination of problems in her length including obesity severe obstructive sleep apnea edema hypertension and mild CAD as well as diabetes.  With all of her comorbidities I think she benefit from bariatric surgery I strongly support her in this decision. 2. Hypertension stable BP at target continue current treatment including her beta-blocker with CAD and diuretic. 3. Sleep apnea evaluated and treated 4. Stable CAD continue medical therapy she is statin intolerant 5. Diabetes poorly controlled with benefit from bariatric surgery   Next appointment: 6 months   Medication Adjustments/Labs and Tests Ordered: Current medicines are reviewed at length with the patient today.  Concerns regarding medicines are outlined above.  No orders of the defined types were placed in this encounter.  No orders of the defined types were placed in this encounter.   Chief Complaint  Patient presents with  . Follow-up  . Congestive Heart Failure    History of Present Illness:    Katelyn [name] is a 64 y.o. female with a hx of hypertension mild CAD dyslipidemia left bundle branch block with an ejection fraction of 55 to 60% restrictive lung disease with shortness of breath due to deconditioning, obesity and statin intolerance  She was last seen 09/16/2019. Compliance with diet, lifestyle and medications: Yes  She was started on BiPAP unfortunately  struggling at home.  Her weight is increased but she has less edema no shortness of breath.  She is strongly considering bariatric surgery and has contacted [facility] not give her last office note from medical records.  Sleep study 07/11/2019: IMPRESSIONS - Severe obstructive sleep apnea occurred during this study (AHI 32.2/h). - No significant central sleep apnea occurred during this study (CAI = 0.0/h). - Severe oxygen desaturation to a nadir of 64%). - Patient snored 5.9% during the sleep.  CT chest 06/11/2019: CAC lungs appear normal  Echo 04/28/2019: 1. The left ventricle has normal systolic function with an ejection fraction of 60-65%. The cavity size was normal. There is mildly increased left ventricular wall thickness. Left ventricular diastolic Doppler parameters are consistent with impairedrelaxationand normal LA pressure 2. The right ventricle has normal systolic function. The cavity was normal. There is no increase in right ventricular wall thickness. 3. The aortic root is normal in size and structure.  Lexiscan myoview 04/09/2019: 1. The left ventricle has normal systolic function with an ejection fraction of 60-65%. The cavity size was normal. There is mildly increased left ventricular wall thickness. Left ventricular diastolic Doppler parameters are consistent with impaired  relaxation. 2. The right ventricle has normal systolic function. The cavity was normal. There is no increase in right ventricular wall thickness. 3. The aortic root is normal in size and structure. Past Medical History:  Diagnosis Date  . Anemia    previous transfusion 2/17  .  Angina pectoris ([place]) 11/24/2015  . Anxiety   . Arthritis   . Benign hypertension 03/14/2016  . Bursitis of right hip   . CAD (coronary artery disease)    Cardiac catheterization June 2014 in [facility] - 50% circumflex stenosis  . CAD in native artery 11/24/2015  . Cannot sleep 12/07/2015  . Chronic  kidney disease    does not see nephrologist  . Chronic low back pain without sciatica 03/14/2016  . Complication of anesthesia   . Cough 04/19/2017   Overview:  Last Assessment & Plan:  Formatting of this note may be different from the original. Cough - ? ACE related with AR triggers   Plan  Patient Instructions  Discuss with your primary doctor that lisinopril pain, need making your cough worse. May use Mucinex DM twice daily as needed for cough and congestion Zyrtec 10 mg at bedtime as needed for drainage Saline nasal spray as needed. Lab tests today Activity as tolerated. Follow with Dr. [name] in 3-4 months and As needed   Please contact office for sooner follow up if symptoms do not improve or worsen or seek emergency care   . Depression   . DOE (dyspnea on exertion) 04/19/2017   Overview:  2017:   Last Assessment & Plan:  Formatting of this note may be different from the original. DOE ? Etiology , felt to be component of deconditioning  Workup has been unrevealing with neg VQ scan , PFT and CXR , reported neg card w/up  Would cont exrecise regimen as tolerate  Plan  Patient Instructions  Discuss with your primary doctor that lisinopril pain, need making your cough worse. May use Mucinex DM twice daily as needed for cough and congestion Zyrtec 10 mg at bedtime as needed for drainage Saline nasal spray as needed. Lab tests today Activity as tolerated. Follow with Dr. [name] in 3-4 months and As needed   Please contact office for sooner follow up if symptoms do not improve or worsen or seek emergency care   . Dyspnea    with exertion   " lazy lung" - per  [name] from back issues- 06/2016  . Elevated liver enzymes 12/05/2016  . Essential hypertension   . GERD (gastroesophageal reflux disease)   . Gout 03/14/2016  . Greater trochanteric bursitis of right hip 02/02/2012  . H/O hiatal hernia   . Heart murmur   . History of blood transfusion 2016  . History of kidney stones   . Hypercholesterolemia   .  Hyperlipemia 01/01/2017  . Iliotibial band syndrome of right side 02/02/2012  . Iron deficiency anemia due to chronic blood loss 03/14/2016  . LBBB (left bundle branch block) 01/01/2017  . Left bundle branch block   . Lumbar stenosis   . Meralgia paraesthetica 12/05/2016  . Neuropathy   . OSA (obstructive sleep apnea) 05/24/2016   Overview:  Managed PULM  . PONV (postoperative nausea and vomiting)    "no N/V with patch"  . Postprocedural state 11/26/2015  . 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[place]) 12/07/2015  . Restless leg 12/07/2015  . Restless leg syndrome   . 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   Overview:  2017: dx  . S/P lumbar laminectomy 11/26/2015  . S/P lumbar spinal fusion 08/29/2016  . Sleep apnea    mild case  no cpap  . Tinnitus 12/05/2016  . Type 2 diabetes mellitus ([place])   . Type 2 diabetes mellitus without complication, with long-term current use of insulin ([place])  03/14/2016   Overview:  Managed ENDO    Past Surgical History:  Procedure Laterality Date  . ABDOMINAL HYSTERECTOMY  1983  . APPENDECTOMY  Age 69  . BACK SURGERY     FIRST LUMBAR FUSION/ SURGERY APRIL 2012 AND FUSION WITH INSTRUMENTATION SEPT 2012  . CARDIAC CATHETERIZATION     x 2  . CARPAL TUNNEL RELEASE     bil  . CHOLECYSTECTOMY  1990's  . CYSTO EXTRACTION KIDNEY STONES    . EXCISION/RELEASE BURSA HIP  02/02/2012   Procedure: EXCISION/RELEASE BURSA HIP;  Surgeon: Tobi Bastos, MD;  Location: [facility];  Service: Orthopedics;  Laterality: Right;  Right Hip Bursectomy  . EYE SURGERY Bilateral    cataracts  . KIDNEY STONE SURGERY  2008  . Left knee surgery x 2  1996   reconstruction  . LUMBAR DISC SURGERY  08/2016  . LUMBAR LAMINECTOMY/DECOMPRESSION MICRODISCECTOMY Right 11/26/2015   Procedure: Extraforaminal Microdiscectomy  - Lumbar two-three- right;  Surgeon: Eustace Moore, MD;  Location: [facility];  Service: Neurosurgery;  Laterality: Right;  right   . LUMBAR WOUND DEBRIDEMENT N/A 10/25/2016    Procedure: Lumbar wound revision;  Surgeon: Eustace Moore, MD;  Location: [facility];  Service: Neurosurgery;  Laterality: N/A;  Lumbar wound revision  . Right shoulder surgery  2010   spur    Current Medications: Current Meds  Medication Sig  . allopurinol (ZYLOPRIM) 100 MG tablet Take 100 mg by mouth daily.  Marland Kitchen aspirin 81 MG tablet Take 81 mg by mouth at bedtime.   . carvedilol (COREG) 6.25 MG tablet Take 6.25 mg by mouth 2 (two) times daily.   . DULoxetine (CYMBALTA) 60 MG capsule Take 60 mg by mouth at bedtime.   Marland Kitchen GLUCOSAMINE-CHONDROITIN PO Take 1 tablet by mouth daily.  . insulin NPH-regular Human (NOVOLIN 70/30) (70-30) 100 UNIT/ML injection Inject 100 Units into the skin 3 (three) times daily.   . mirtazapine (REMERON) 15 MG tablet Take 15 mg by mouth at bedtime.  . Multiple Vitamin (MULTIVITAMIN) tablet Take 1 tablet by mouth daily.  . nitroGLYCERIN (NITROSTAT) 0.4 MG SL tablet Place 0.4 mg under the tongue every 5 (five) minutes as needed for chest pain.   Glory Rosebush VERIO test strip USE 1 STRIP TO CHECK GLUCOSE 4 TIMES DAILY AS DIRECTED  . oxybutynin (DITROPAN) 5 MG tablet Take 5 mg by mouth daily.  . pantoprazole (PROTONIX) 40 MG tablet Take 1 tablet by mouth once daily  . pramipexole (MIRAPEX) 1 MG tablet Take 1 mg by mouth at bedtime.  . solifenacin (VESICARE) 5 MG tablet Take 5 mg by mouth every morning.  . torsemide (DEMADEX) 20 MG tablet TAKE 1 TABLET BY MOUTH TWICE DAILY. TAKE ONCE A DAY FOR WEIGHT <200 LBS  . TURMERIC CURCUMIN PO Take 1 tablet by mouth daily.  . valACYclovir (VALTREX) 1000 MG tablet Take 1,000 mg by mouth 2 (two) times daily as needed.   Current Facility-Administered Medications for the 03/04/20 encounter (Office Visit) with Richardo Priest, MD  Medication  . triamcinolone acetonide (KENALOG) 10 MG/ML injection 10 mg  . triamcinolone acetonide (KENALOG) 10 MG/ML injection 10 mg     Allergies:   Atorvastatin, Talwin [pentazocine], and Other   Social  History   Socioeconomic History  . Marital status: Married    Spouse name: Not on file  . Number of children: Not on file  . Years of education: Not on file  . Highest education level: Not on  file  Occupational History  . Occupation: Archivist  Tobacco Use  . Smoking status: Never Smoker  . Smokeless tobacco: Never Used  Substance and Sexual Activity  . Alcohol use: Yes    Alcohol/week: 0.0 standard drinks    Comment: Rarely  . Drug use: No  . Sexual activity: Not on file  Other Topics Concern  . Not on file  Social History Narrative  . Not on file   Social Determinants of Health   Financial Resource Strain:   . Difficulty of Paying Living Expenses:   Food Insecurity:   . Worried About Charity fundraiser in the Last Year:   . Arboriculturist in the Last Year:   Transportation Needs:   . Film/video editor (Medical):   Marland Kitchen Lack of Transportation (Non-Medical):   Physical Activity:   . Days of Exercise per Week:   . Minutes of Exercise per Session:   Stress:   . Feeling of Stress :   Social Connections:   . Frequency of Communication with Friends and Family:   . Frequency of Social Gatherings with Friends and Family:   . Attends Religious Services:   . Active Member of Clubs or Organizations:   . Attends Archivist Meetings:   Marland Kitchen Marital Status:      Family History: The patient's family history includes Asthma in her sister; Bone cancer in her sister; Heart failure in her mother; Hypertension in her father; Lung cancer in her father; Stroke in her father. ROS:   Please see the history of present illness.    All other systems reviewed and are negative.  EKGs/Labs/Other Studies Reviewed:    The following studies were reviewed today:    Recent Labs: August 11, 2019 cholesterol 189 HDL 52 LDL 119 A1c 9.0% 06/11/2019: ALT 116; BUN 31; Creatinine, Ser 1.10; Hemoglobin 13.2; NT-Pro BNP 33; Platelets 234; Potassium 4.3; Sodium 139    Recent Lipid Panel No results found for: CHOL, TRIG, HDL, CHOLHDL, VLDL, LDLCALC, LDLDIRECT  Physical Exam:    VS:  BP 126/76   Pulse 72   Ht 5\' 2"  (1.575 m)   Wt 217 lb (98.4 kg)   SpO2 95%   BMI 39.69 kg/m     Wt Readings from Last 3 Encounters:  03/04/20 217 lb (98.4 kg)  01/21/20 218 lb 3.2 oz (99 kg)  12/25/19 218 lb (98.9 kg)     GEN: Obese BMI approaches 40 well nourished, well developed in no acute distress HEENT: Normal NECK: No JVD; No carotid bruits LYMPHATICS: No lymphadenopathy CARDIAC: RRR, no murmurs, rubs, gallops RESPIRATORY:  Clear to auscultation without rales, wheezing or rhonchi  ABDOMEN: Soft, non-tender, non-distended MUSCULOSKELETAL:  No edema; No deformity  SKIN: Warm and dry NEUROLOGIC:  Alert and oriented x 3 PSYCHIATRIC:  Normal affect    Signed, Shirlee More, MD  03/04/2020 3:01 PM    [facility]

## 2020-03-04 ENCOUNTER — Ambulatory Visit (INDEPENDENT_AMBULATORY_CARE_PROVIDER_SITE_OTHER): Payer: PPO | Admitting: Cardiology

## 2020-03-04 ENCOUNTER — Other Ambulatory Visit: Payer: Self-pay

## 2020-03-04 ENCOUNTER — Encounter: Payer: Self-pay | Admitting: Cardiology

## 2020-03-04 VITALS — BP 126/76 | HR 72 | Ht 62.0 in | Wt 217.0 lb

## 2020-03-04 DIAGNOSIS — Z6839 Body mass index (BMI) 39.0-39.9, adult: Secondary | ICD-10-CM

## 2020-03-04 DIAGNOSIS — I447 Left bundle-branch block, unspecified: Secondary | ICD-10-CM | POA: Diagnosis not present

## 2020-03-04 DIAGNOSIS — G4733 Obstructive sleep apnea (adult) (pediatric): Secondary | ICD-10-CM | POA: Diagnosis not present

## 2020-03-04 DIAGNOSIS — I2510 Atherosclerotic heart disease of native coronary artery without angina pectoris: Secondary | ICD-10-CM

## 2020-03-04 DIAGNOSIS — I110 Hypertensive heart disease with heart failure: Secondary | ICD-10-CM

## 2020-03-04 NOTE — Patient Instructions (Addendum)
Medication Instructions:  Your physician recommends that you continue on your current medications as directed. Please refer to the Current Medication list given to you today.  *If you need a refill on your cardiac medications before your next appointment, please call your pharmacy*   Lab Work: Your physician recommends that you return for lab work in: TODAY BMP If you have labs (blood work) drawn today and your tests are completely normal, you will receive your results only by: MyChart Message (if you have MyChart) OR A paper copy in the mail If you have any lab test that is abnormal or we need to change your treatment, we will call you to review the results.   Testing/Procedures: None   Follow-Up: At [facility], you and your health needs are our priority.  As part of our continuing mission to provide you with exceptional heart care, we have created designated Provider Care Teams.  These Care Teams include your primary Cardiologist (physician) and Advanced Practice Providers (APPs -  Physician Assistants and Nurse Practitioners) who all work together to provide you with the care you need, when you need it.  We recommend signing up for the patient portal called "MyChart".  Sign up information is provided on this After Visit Summary.  MyChart is used to connect with patients for Virtual Visits (Telemedicine).  Patients are able to view lab/test results, encounter notes, upcoming appointments, etc.  Non-urgent messages can be sent to your provider as well.   To learn more about what you can do with MyChart, go to [email or web address].    Your next appointment:   6 month(s)  The format for your next appointment:   In Person  Provider:   Brian Munley, MD   Other Instructions   

## 2020-03-05 ENCOUNTER — Telehealth: Payer: Self-pay

## 2020-03-05 LAB — BASIC METABOLIC PANEL
BUN/Creatinine Ratio: 19 (ref 12–28)
BUN: 23 mg/dL (ref 8–27)
CO2: 27 mmol/L (ref 20–29)
Calcium: 9.5 mg/dL (ref 8.7–10.3)
Chloride: 97 mmol/L (ref 96–106)
Creatinine, Ser: 1.24 mg/dL — ABNORMAL HIGH (ref 0.57–1.00)
GFR calc Af Amer: 54 mL/min/{1.73_m2} — ABNORMAL LOW (ref >59)
GFR calc non Af Amer: 47 mL/min/{1.73_m2} — ABNORMAL LOW (ref >59)
Glucose: 125 mg/dL — ABNORMAL HIGH (ref 65–99)
Potassium: 4 mmol/L (ref 3.5–5.2)
Sodium: 139 mmol/L (ref 134–144)

## 2020-03-05 NOTE — Telephone Encounter (Signed)
-----   Message from Richardo Priest, MD sent at 03/05/2020  7:54 AM EDT ----- Normal or stable result  No changes

## 2020-03-05 NOTE — Telephone Encounter (Signed)
Spoke with patient regarding results and recommendation.  Patient verbalizes understanding and is agreeable to plan of care. Advised patient to call back with any issues or concerns.  

## 2020-03-26 DIAGNOSIS — I2510 Atherosclerotic heart disease of native coronary artery without angina pectoris: Secondary | ICD-10-CM | POA: Diagnosis not present

## 2020-03-26 DIAGNOSIS — E1142 Type 2 diabetes mellitus with diabetic polyneuropathy: Secondary | ICD-10-CM | POA: Diagnosis not present

## 2020-03-26 DIAGNOSIS — I10 Essential (primary) hypertension: Secondary | ICD-10-CM | POA: Diagnosis not present

## 2020-03-26 DIAGNOSIS — F329 Major depressive disorder, single episode, unspecified: Secondary | ICD-10-CM | POA: Diagnosis not present

## 2020-03-26 DIAGNOSIS — M109 Gout, unspecified: Secondary | ICD-10-CM | POA: Diagnosis not present

## 2020-03-26 DIAGNOSIS — G4730 Sleep apnea, unspecified: Secondary | ICD-10-CM | POA: Diagnosis not present

## 2020-03-26 DIAGNOSIS — I5189 Other ill-defined heart diseases: Secondary | ICD-10-CM | POA: Diagnosis not present

## 2020-03-26 DIAGNOSIS — Z794 Long term (current) use of insulin: Secondary | ICD-10-CM | POA: Diagnosis not present

## 2020-03-26 DIAGNOSIS — I700 Atherosclerosis of aorta: Secondary | ICD-10-CM | POA: Diagnosis not present

## 2020-03-26 DIAGNOSIS — E669 Obesity, unspecified: Secondary | ICD-10-CM | POA: Diagnosis not present

## 2020-03-26 DIAGNOSIS — G43909 Migraine, unspecified, not intractable, without status migrainosus: Secondary | ICD-10-CM | POA: Diagnosis not present

## 2020-03-26 DIAGNOSIS — E1140 Type 2 diabetes mellitus with diabetic neuropathy, unspecified: Secondary | ICD-10-CM | POA: Diagnosis not present

## 2020-04-02 DIAGNOSIS — G4733 Obstructive sleep apnea (adult) (pediatric): Secondary | ICD-10-CM | POA: Diagnosis not present

## 2020-04-05 DIAGNOSIS — Z0000 Encounter for general adult medical examination without abnormal findings: Secondary | ICD-10-CM | POA: Diagnosis not present

## 2020-04-09 DIAGNOSIS — N952 Postmenopausal atrophic vaginitis: Secondary | ICD-10-CM | POA: Diagnosis not present

## 2020-04-09 DIAGNOSIS — N3946 Mixed incontinence: Secondary | ICD-10-CM | POA: Diagnosis not present

## 2020-05-02 ENCOUNTER — Other Ambulatory Visit: Payer: Self-pay | Admitting: Cardiology

## 2020-05-02 DIAGNOSIS — G4733 Obstructive sleep apnea (adult) (pediatric): Secondary | ICD-10-CM | POA: Diagnosis not present

## 2020-06-02 DIAGNOSIS — G4733 Obstructive sleep apnea (adult) (pediatric): Secondary | ICD-10-CM | POA: Diagnosis not present

## 2020-06-25 DIAGNOSIS — G4733 Obstructive sleep apnea (adult) (pediatric): Secondary | ICD-10-CM | POA: Diagnosis not present

## 2020-07-03 DIAGNOSIS — G4733 Obstructive sleep apnea (adult) (pediatric): Secondary | ICD-10-CM | POA: Diagnosis not present

## 2020-08-02 DIAGNOSIS — G4733 Obstructive sleep apnea (adult) (pediatric): Secondary | ICD-10-CM | POA: Diagnosis not present

## 2020-09-01 DIAGNOSIS — B009 Herpesviral infection, unspecified: Secondary | ICD-10-CM | POA: Diagnosis not present

## 2020-09-01 DIAGNOSIS — L299 Pruritus, unspecified: Secondary | ICD-10-CM | POA: Diagnosis not present

## 2020-09-02 DIAGNOSIS — G4733 Obstructive sleep apnea (adult) (pediatric): Secondary | ICD-10-CM | POA: Diagnosis not present

## 2020-09-10 DIAGNOSIS — R928 Other abnormal and inconclusive findings on diagnostic imaging of breast: Secondary | ICD-10-CM | POA: Diagnosis not present

## 2020-09-10 DIAGNOSIS — R921 Mammographic calcification found on diagnostic imaging of breast: Secondary | ICD-10-CM | POA: Diagnosis not present

## 2020-09-14 DIAGNOSIS — I700 Atherosclerosis of aorta: Secondary | ICD-10-CM | POA: Diagnosis not present

## 2020-09-14 DIAGNOSIS — D509 Iron deficiency anemia, unspecified: Secondary | ICD-10-CM | POA: Diagnosis not present

## 2020-09-14 DIAGNOSIS — R82998 Other abnormal findings in urine: Secondary | ICD-10-CM | POA: Diagnosis not present

## 2020-09-14 DIAGNOSIS — E1140 Type 2 diabetes mellitus with diabetic neuropathy, unspecified: Secondary | ICD-10-CM | POA: Diagnosis not present

## 2020-09-14 DIAGNOSIS — E785 Hyperlipidemia, unspecified: Secondary | ICD-10-CM | POA: Diagnosis not present

## 2020-09-14 DIAGNOSIS — N1831 Chronic kidney disease, stage 3a: Secondary | ICD-10-CM | POA: Diagnosis not present

## 2020-09-14 DIAGNOSIS — Z23 Encounter for immunization: Secondary | ICD-10-CM | POA: Diagnosis not present

## 2020-09-14 DIAGNOSIS — M109 Gout, unspecified: Secondary | ICD-10-CM | POA: Diagnosis not present

## 2020-09-14 DIAGNOSIS — I5032 Chronic diastolic (congestive) heart failure: Secondary | ICD-10-CM | POA: Diagnosis not present

## 2020-09-14 DIAGNOSIS — F329 Major depressive disorder, single episode, unspecified: Secondary | ICD-10-CM | POA: Diagnosis not present

## 2020-09-14 DIAGNOSIS — K760 Fatty (change of) liver, not elsewhere classified: Secondary | ICD-10-CM | POA: Diagnosis not present

## 2020-09-14 DIAGNOSIS — I2510 Atherosclerotic heart disease of native coronary artery without angina pectoris: Secondary | ICD-10-CM | POA: Diagnosis not present

## 2020-09-14 DIAGNOSIS — I130 Hypertensive heart and chronic kidney disease with heart failure and stage 1 through stage 4 chronic kidney disease, or unspecified chronic kidney disease: Secondary | ICD-10-CM | POA: Diagnosis not present

## 2020-10-02 DIAGNOSIS — G4733 Obstructive sleep apnea (adult) (pediatric): Secondary | ICD-10-CM | POA: Diagnosis not present

## 2020-10-04 ENCOUNTER — Inpatient Hospital Stay (HOSPITAL_COMMUNITY)
Admission: EM | Admit: 2020-10-04 | Discharge: 2020-10-07 | DRG: 287 | Disposition: A | Discharge status: Home Health | Payer: PPO | Attending: Cardiology | Admitting: Cardiology

## 2020-10-04 ENCOUNTER — Encounter: Payer: Self-pay | Admitting: Physician Assistant

## 2020-10-04 ENCOUNTER — Emergency Department (HOSPITAL_COMMUNITY): Payer: PPO

## 2020-10-04 ENCOUNTER — Other Ambulatory Visit: Payer: Self-pay

## 2020-10-04 DIAGNOSIS — K222 Esophageal obstruction: Secondary | ICD-10-CM | POA: Diagnosis present

## 2020-10-04 DIAGNOSIS — R531 Weakness: Secondary | ICD-10-CM | POA: Diagnosis present

## 2020-10-04 DIAGNOSIS — Z8249 Family history of ischemic heart disease and other diseases of the circulatory system: Secondary | ICD-10-CM

## 2020-10-04 DIAGNOSIS — E119 Type 2 diabetes mellitus without complications: Secondary | ICD-10-CM

## 2020-10-04 DIAGNOSIS — D539 Nutritional anemia, unspecified: Secondary | ICD-10-CM | POA: Diagnosis present

## 2020-10-04 DIAGNOSIS — E1122 Type 2 diabetes mellitus with diabetic chronic kidney disease: Secondary | ICD-10-CM | POA: Diagnosis present

## 2020-10-04 DIAGNOSIS — R0902 Hypoxemia: Secondary | ICD-10-CM | POA: Diagnosis not present

## 2020-10-04 DIAGNOSIS — J9612 Chronic respiratory failure with hypercapnia: Secondary | ICD-10-CM | POA: Diagnosis present

## 2020-10-04 DIAGNOSIS — I2510 Atherosclerotic heart disease of native coronary artery without angina pectoris: Secondary | ICD-10-CM | POA: Diagnosis present

## 2020-10-04 DIAGNOSIS — R079 Chest pain, unspecified: Secondary | ICD-10-CM | POA: Diagnosis present

## 2020-10-04 DIAGNOSIS — Z6841 Body Mass Index (BMI) 40.0 and over, adult: Secondary | ICD-10-CM

## 2020-10-04 DIAGNOSIS — I130 Hypertensive heart and chronic kidney disease with heart failure and stage 1 through stage 4 chronic kidney disease, or unspecified chronic kidney disease: Secondary | ICD-10-CM | POA: Diagnosis present

## 2020-10-04 DIAGNOSIS — K219 Gastro-esophageal reflux disease without esophagitis: Secondary | ICD-10-CM | POA: Diagnosis present

## 2020-10-04 DIAGNOSIS — M1990 Unspecified osteoarthritis, unspecified site: Secondary | ICD-10-CM | POA: Diagnosis present

## 2020-10-04 DIAGNOSIS — Z20822 Contact with and (suspected) exposure to covid-19: Secondary | ICD-10-CM | POA: Diagnosis present

## 2020-10-04 DIAGNOSIS — R0789 Other chest pain: Secondary | ICD-10-CM | POA: Diagnosis present

## 2020-10-04 DIAGNOSIS — E1140 Type 2 diabetes mellitus with diabetic neuropathy, unspecified: Secondary | ICD-10-CM | POA: Diagnosis present

## 2020-10-04 DIAGNOSIS — I2699 Other pulmonary embolism without acute cor pulmonale: Secondary | ICD-10-CM | POA: Diagnosis not present

## 2020-10-04 DIAGNOSIS — Z888 Allergy status to other drugs, medicaments and biological substances status: Secondary | ICD-10-CM

## 2020-10-04 DIAGNOSIS — M545 Low back pain, unspecified: Secondary | ICD-10-CM | POA: Diagnosis present

## 2020-10-04 DIAGNOSIS — Z7982 Long term (current) use of aspirin: Secondary | ICD-10-CM

## 2020-10-04 DIAGNOSIS — I159 Secondary hypertension, unspecified: Secondary | ICD-10-CM | POA: Diagnosis not present

## 2020-10-04 DIAGNOSIS — Z0000 Encounter for general adult medical examination without abnormal findings: Secondary | ICD-10-CM

## 2020-10-04 DIAGNOSIS — I110 Hypertensive heart disease with heart failure: Secondary | ICD-10-CM | POA: Diagnosis present

## 2020-10-04 DIAGNOSIS — R0602 Shortness of breath: Secondary | ICD-10-CM | POA: Diagnosis present

## 2020-10-04 DIAGNOSIS — Z90710 Acquired absence of both cervix and uterus: Secondary | ICD-10-CM

## 2020-10-04 DIAGNOSIS — I447 Left bundle-branch block, unspecified: Secondary | ICD-10-CM | POA: Diagnosis present

## 2020-10-04 DIAGNOSIS — N1831 Chronic kidney disease, stage 3a: Secondary | ICD-10-CM | POA: Diagnosis present

## 2020-10-04 DIAGNOSIS — N390 Urinary tract infection, site not specified: Secondary | ICD-10-CM | POA: Diagnosis not present

## 2020-10-04 DIAGNOSIS — G4733 Obstructive sleep apnea (adult) (pediatric): Secondary | ICD-10-CM | POA: Diagnosis present

## 2020-10-04 DIAGNOSIS — Z9119 Patient's noncompliance with other medical treatment and regimen: Secondary | ICD-10-CM

## 2020-10-04 DIAGNOSIS — Z87442 Personal history of urinary calculi: Secondary | ICD-10-CM

## 2020-10-04 DIAGNOSIS — J984 Other disorders of lung: Secondary | ICD-10-CM | POA: Diagnosis present

## 2020-10-04 DIAGNOSIS — Z79899 Other long term (current) drug therapy: Secondary | ICD-10-CM

## 2020-10-04 DIAGNOSIS — Z8719 Personal history of other diseases of the digestive system: Secondary | ICD-10-CM

## 2020-10-04 DIAGNOSIS — E662 Morbid (severe) obesity with alveolar hypoventilation: Secondary | ICD-10-CM | POA: Diagnosis present

## 2020-10-04 DIAGNOSIS — F32A Depression, unspecified: Secondary | ICD-10-CM | POA: Diagnosis present

## 2020-10-04 DIAGNOSIS — Z981 Arthrodesis status: Secondary | ICD-10-CM

## 2020-10-04 DIAGNOSIS — I272 Pulmonary hypertension, unspecified: Secondary | ICD-10-CM | POA: Diagnosis present

## 2020-10-04 DIAGNOSIS — Z794 Long term (current) use of insulin: Secondary | ICD-10-CM

## 2020-10-04 DIAGNOSIS — E1165 Type 2 diabetes mellitus with hyperglycemia: Secondary | ICD-10-CM | POA: Diagnosis present

## 2020-10-04 DIAGNOSIS — I5032 Chronic diastolic (congestive) heart failure: Secondary | ICD-10-CM | POA: Diagnosis present

## 2020-10-04 DIAGNOSIS — N183 Chronic kidney disease, stage 3 unspecified: Secondary | ICD-10-CM | POA: Diagnosis present

## 2020-10-04 DIAGNOSIS — I200 Unstable angina: Secondary | ICD-10-CM | POA: Diagnosis not present

## 2020-10-04 DIAGNOSIS — G8929 Other chronic pain: Secondary | ICD-10-CM | POA: Diagnosis present

## 2020-10-04 DIAGNOSIS — R0609 Other forms of dyspnea: Secondary | ICD-10-CM | POA: Diagnosis present

## 2020-10-04 DIAGNOSIS — R0982 Postnasal drip: Secondary | ICD-10-CM | POA: Diagnosis present

## 2020-10-04 DIAGNOSIS — I213 ST elevation (STEMI) myocardial infarction of unspecified site: Secondary | ICD-10-CM | POA: Diagnosis not present

## 2020-10-04 DIAGNOSIS — I270 Primary pulmonary hypertension: Secondary | ICD-10-CM | POA: Diagnosis not present

## 2020-10-04 DIAGNOSIS — R072 Precordial pain: Secondary | ICD-10-CM | POA: Diagnosis not present

## 2020-10-04 DIAGNOSIS — G2581 Restless legs syndrome: Secondary | ICD-10-CM | POA: Diagnosis present

## 2020-10-04 DIAGNOSIS — R0600 Dyspnea, unspecified: Secondary | ICD-10-CM | POA: Diagnosis not present

## 2020-10-04 DIAGNOSIS — F419 Anxiety disorder, unspecified: Secondary | ICD-10-CM | POA: Diagnosis present

## 2020-10-04 DIAGNOSIS — E785 Hyperlipidemia, unspecified: Secondary | ICD-10-CM | POA: Diagnosis present

## 2020-10-04 DIAGNOSIS — Z825 Family history of asthma and other chronic lower respiratory diseases: Secondary | ICD-10-CM

## 2020-10-04 DIAGNOSIS — R29898 Other symptoms and signs involving the musculoskeletal system: Secondary | ICD-10-CM | POA: Diagnosis present

## 2020-10-04 DIAGNOSIS — J9611 Chronic respiratory failure with hypoxia: Secondary | ICD-10-CM | POA: Diagnosis not present

## 2020-10-04 DIAGNOSIS — M109 Gout, unspecified: Secondary | ICD-10-CM | POA: Diagnosis present

## 2020-10-04 HISTORY — DX: Chronic kidney disease, stage 3 unspecified: N18.30

## 2020-10-04 HISTORY — DX: Chest pain, unspecified: R07.9

## 2020-10-04 LAB — CBC WITH DIFFERENTIAL/PLATELET
Abs Immature Granulocytes: 0.02 10*3/uL (ref 0.00–0.07)
Basophils Absolute: 0 10*3/uL (ref 0.0–0.1)
Basophils Relative: 1 %
Eosinophils Absolute: 0.2 10*3/uL (ref 0.0–0.5)
Eosinophils Relative: 3 %
HCT: 34.8 % — ABNORMAL LOW (ref 36.0–46.0)
Hemoglobin: 11.8 g/dL — ABNORMAL LOW (ref 12.0–15.0)
Immature Granulocytes: 0 %
Lymphocytes Relative: 25 %
Lymphs Abs: 1.5 10*3/uL (ref 0.7–4.0)
MCH: 33.9 pg (ref 26.0–34.0)
MCHC: 33.9 g/dL (ref 30.0–36.0)
MCV: 100 fL (ref 80.0–100.0)
Monocytes Absolute: 0.5 10*3/uL (ref 0.1–1.0)
Monocytes Relative: 9 %
Neutro Abs: 3.6 10*3/uL (ref 1.7–7.7)
Neutrophils Relative %: 62 %
Platelets: 164 10*3/uL (ref 150–400)
RBC: 3.48 MIL/uL — ABNORMAL LOW (ref 3.87–5.11)
RDW: 12.9 % (ref 11.5–15.5)
WBC: 5.8 10*3/uL (ref 4.0–10.5)
nRBC: 0 % (ref 0.0–0.2)

## 2020-10-04 LAB — BASIC METABOLIC PANEL
Anion gap: 11 (ref 5–15)
BUN: 25 mg/dL — ABNORMAL HIGH (ref 8–23)
CO2: 28 mmol/L (ref 22–32)
Calcium: 9.2 mg/dL (ref 8.9–10.3)
Chloride: 98 mmol/L (ref 98–111)
Creatinine, Ser: 1.26 mg/dL — ABNORMAL HIGH (ref 0.44–1.00)
GFR, Estimated: 48 mL/min — ABNORMAL LOW (ref >60)
Glucose, Bld: 288 mg/dL — ABNORMAL HIGH (ref 70–99)
Potassium: 3.6 mmol/L (ref 3.5–5.1)
Sodium: 137 mmol/L (ref 135–145)

## 2020-10-04 LAB — CBG MONITORING, ED: Glucose-Capillary: 190 mg/dL — ABNORMAL HIGH (ref 70–99)

## 2020-10-04 LAB — RESP PANEL BY RT-PCR (FLU A&B, COVID) ARPGX2
Influenza A by PCR: NEGATIVE
Influenza B by PCR: NEGATIVE
SARS Coronavirus 2 by RT PCR: NEGATIVE

## 2020-10-04 LAB — D-DIMER, QUANTITATIVE: D-Dimer, Quant: 0.3 ug/mL-FEU (ref 0.00–0.50)

## 2020-10-04 LAB — TROPONIN I (HIGH SENSITIVITY)
Troponin I (High Sensitivity): 11 ng/L (ref <18)
Troponin I (High Sensitivity): 11 ng/L (ref <18)

## 2020-10-04 LAB — BRAIN NATRIURETIC PEPTIDE: B Natriuretic Peptide: 32.9 pg/mL (ref 0.0–100.0)

## 2020-10-04 MED ORDER — ALLOPURINOL 100 MG PO TABS
100.0000 mg | ORAL_TABLET | Freq: Every day | ORAL | Status: DC
  Administered 2020-10-04 – 2020-10-06 (×3): 100 mg via ORAL
  Filled 2020-10-04 (×3): qty 1

## 2020-10-04 MED ORDER — CARVEDILOL 6.25 MG PO TABS
6.2500 mg | ORAL_TABLET | Freq: Two times a day (BID) | ORAL | Status: DC
  Administered 2020-10-04 – 2020-10-07 (×6): 6.25 mg via ORAL
  Filled 2020-10-04 (×6): qty 1

## 2020-10-04 MED ORDER — ONDANSETRON HCL 4 MG/2ML IJ SOLN: 4.0000 mg | Freq: Four times a day (QID) | INTRAMUSCULAR | Status: DC | PRN

## 2020-10-04 MED ORDER — ASPIRIN EC 81 MG PO TBEC: 81.0000 mg | DELAYED_RELEASE_TABLET | Freq: Every day | ORAL | Status: DC

## 2020-10-04 MED ORDER — PREGABALIN 25 MG PO CAPS
50.0000 mg | ORAL_CAPSULE | Freq: Every day | ORAL | Status: DC
  Administered 2020-10-04 – 2020-10-06 (×3): 50 mg via ORAL
  Filled 2020-10-04 (×3): qty 2

## 2020-10-04 MED ORDER — ENOXAPARIN SODIUM 40 MG/0.4ML ~~LOC~~ SOLN
40.0000 mg | SUBCUTANEOUS | Status: DC
  Administered 2020-10-04: 22:00:00 40 mg via SUBCUTANEOUS
  Filled 2020-10-04: qty 0.4

## 2020-10-04 MED ORDER — MIRTAZAPINE 15 MG PO TABS
15.0000 mg | ORAL_TABLET | Freq: Every day | ORAL | Status: DC
  Administered 2020-10-04 – 2020-10-06 (×3): 15 mg via ORAL
  Filled 2020-10-04 (×3): qty 1

## 2020-10-04 MED ORDER — INSULIN ASPART 100 UNIT/ML ~~LOC~~ SOLN
0.0000 [IU] | Freq: Three times a day (TID) | SUBCUTANEOUS | Status: DC
  Administered 2020-10-05 (×2): 2 [IU] via SUBCUTANEOUS
  Administered 2020-10-05: 13:00:00 3 [IU] via SUBCUTANEOUS
  Administered 2020-10-06 (×2): 8 [IU] via SUBCUTANEOUS
  Administered 2020-10-06 – 2020-10-07 (×2): 11 [IU] via SUBCUTANEOUS

## 2020-10-04 MED ORDER — INSULIN ASPART 100 UNIT/ML ~~LOC~~ SOLN
0.0000 [IU] | Freq: Every day | SUBCUTANEOUS | Status: DC
  Administered 2020-10-05 – 2020-10-06 (×2): 3 [IU] via SUBCUTANEOUS

## 2020-10-04 MED ORDER — TORSEMIDE 20 MG PO TABS: 20.0000 mg | ORAL_TABLET | Freq: Every day | ORAL | Status: DC

## 2020-10-04 MED ORDER — ASPIRIN EC 81 MG PO TBEC
81.0000 mg | DELAYED_RELEASE_TABLET | Freq: Every day | ORAL | Status: DC
  Administered 2020-10-05 – 2020-10-07 (×3): 81 mg via ORAL
  Filled 2020-10-04 (×3): qty 1

## 2020-10-04 MED ORDER — DULOXETINE HCL 60 MG PO CPEP
60.0000 mg | ORAL_CAPSULE | Freq: Every day | ORAL | Status: DC
  Administered 2020-10-04 – 2020-10-06 (×3): 60 mg via ORAL
  Filled 2020-10-04 (×3): qty 1

## 2020-10-04 MED ORDER — ACETAMINOPHEN 325 MG PO TABS
650.0000 mg | ORAL_TABLET | ORAL | Status: DC | PRN
  Administered 2020-10-05 – 2020-10-07 (×4): 650 mg via ORAL
  Filled 2020-10-04 (×4): qty 2

## 2020-10-04 MED ORDER — NITROGLYCERIN 0.4 MG SL SUBL: 0.4000 mg | SUBLINGUAL_TABLET | SUBLINGUAL | Status: DC | PRN

## 2020-10-04 MED ORDER — PANTOPRAZOLE SODIUM 40 MG PO TBEC
40.0000 mg | DELAYED_RELEASE_TABLET | Freq: Every day | ORAL | Status: DC
  Administered 2020-10-05 – 2020-10-07 (×3): 40 mg via ORAL
  Filled 2020-10-04 (×3): qty 1

## 2020-10-04 MED ORDER — TORSEMIDE 20 MG PO TABS: 20.0000 mg | ORAL_TABLET | ORAL | Status: DC

## 2020-10-04 MED ORDER — PRAMIPEXOLE DIHYDROCHLORIDE 1 MG PO TABS
1.0000 mg | ORAL_TABLET | Freq: Every day | ORAL | Status: DC
  Administered 2020-10-04 – 2020-10-06 (×3): 1 mg via ORAL
  Filled 2020-10-04 (×4): qty 1

## 2020-10-04 NOTE — ED Triage Notes (Signed)
BIB EMS for Chest pain, SOB and increasing lethargy. Elevations noticed in ECG. Pt able to talk in complete sentences, no diaphoresis, no c/o vomiting or nausea.;

## 2020-10-04 NOTE — ED Provider Notes (Signed)
Care taken over from Dr. Gwenlyn Fudge.  She comes in with worsening shortness of breath and some tightness/pressure in her upper back and left chest.  It seems to be worse with exertion.  Her symptoms did not seem consistent with dissection.  Her troponin is negative.  Pt has been seen by cardiology who will admit her.   Rolan Bucco, MD 10/04/20 [phone]

## 2020-10-04 NOTE — H&P (Signed)
Cardiology Admission History and Physical:   Patient ID: SAVONA DORCH MRN: [id number]; DOB: Jan 09, 1956  Admit date: 10/04/2020 Date of Consult: 10/04/2020  Primary Care Provider: Algis Greenhouse, MD [facility] Cardiologist: Shirlee More, MD  [facility] Electrophysiologist:  None    Patient Profile:   Katelyn [name] is a 64 y.o. female with a hx of mild CAD by cath 2015 (50% mid LCx at [facility]), hypertensive heart disease, CKD stage IIIa, arthritis, anemia, anxiety, depression, HTN, HLD, lumbar stenosis, OSA (uses [place]/intolerant of CPAP), DM, morbid obesity who is being seen today for the evaluation of chest pain/SOB at the request of Dr. Tamera Punt.  History of Present Illness:   She follows with Dr. Bettina Gavia for history of mild CAD and hypertensive heart disease. She has had chronic SOB without particularly revealing workup. VQ scan in 2017 was normal. Outside CT of chest 06/2019 did not show any significant findings. Nuclear stress test in 04/2019 was normal and echo at that time showed EF 60-65%, mildly increased LV wall thickness, impaired diastolic relaxation. LE venous duplex was also negative at that time. She has seen pulmonology. Her chart carries history of restrictive lung disease but this is not substantiated in the notes. Dr. [name] felt her dyspnea was from obesity and deconditioning. She also follows with Dr. Claiborne Billings for severe OSA. Last note 12/2019 indicated recommendation to continue CPAP but she did not tolerate this so only uses nasal cannula nightly and has not yet followed up since that visit.  She reports continued worsening DOE over the last several months, but most prominent starting 09/29/20. She has to stop and rest because she feels like she has run a marathon with any activity. She also has had associated chest pressure with this. Sometimes she feels pressure in her arms as if a blood pressure cuff has inflated or focal pain going to her mid back. She will usually take 324mg   of ASA then go sit in a recliner and it resolves. Symptoms are also accompanied by a sharp burning pain in her legs associated with tenderness although patient reports this is chronic even back to last year when she had her venous duplex. Last night she attended a Christmas gathering and felt very fatigued all evening. Around 3am she awoke with further chest discomfort and dyspnea. She took 324mg  ASA and it did not resolve. She had a restless night with persistent discomfort that has lasted into the morning and afternoon. She also took additional 324mg  ASA and 1 SL NTG without relief. She had company over and due to persistent symptoms they recommended she call EMS. They administered 2 SL NTG which brought pain down from 7/10 to 2/10 albeit gradually. VSS on arrival. She endorses a chronic cough. No recent fevers, chills, nausea, edema, bleeding or sick contacts. She was initially activated as a code STEMI due to presence of LBBB although this was cancelled given chronicity of bundle.  Labs showed initial negative troponin negative x 1, mild anemia with Hgb 11.8, glucose 288, Cr 1.26 c/w prior, BNP normal at 32.9, d-dimer normal. CXR shows mild hypoinflation, linear right basilar opacity, likely atelectasis versus superimposition artifact, no pneumothorax or pleural effusion. Covid swab pending   Past Medical History:  Diagnosis Date  . Anemia    previous transfusion 2/17  . Anxiety   . Arthritis   . Benign hypertension 03/14/2016  . Bursitis of right hip   . CAD (coronary artery disease)    Cardiac catheterization June 2014  in [facility] - 50% circumflex stenosis  . Chronic kidney disease, stage 3 (HCC)    does not see nephrologist  . Chronic low back pain without sciatica 03/14/2016  . Complication of anesthesia   . Cough 04/19/2017   Overview:  Last Assessment & Plan:  Formatting of this note may be different from the original. Cough - ? ACE related with AR triggers   Plan  Patient Instructions   Discuss with your primary doctor that lisinopril pain, need making your cough worse. May use Mucinex DM twice daily as needed for cough and congestion Zyrtec 10 mg at bedtime as needed for drainage Saline nasal spray as needed. Lab tests today Activity as tolerated. Follow with Dr. [name] in 3-4 months and As needed   Please contact office for sooner follow up if symptoms do not improve or worsen or seek emergency care   . Depression   . Dyspnea    with exertion   " lazy lung" - per  [name] from back issues- 06/2016  . Elevated liver enzymes 12/05/2016  . Essential hypertension   . GERD (gastroesophageal reflux disease)   . Gout 03/14/2016  . Greater trochanteric bursitis of right hip 02/02/2012  . H/O hiatal hernia   . Heart murmur   . History of blood transfusion 2016  . History of kidney stones   . Hypercholesterolemia   . Iliotibial band syndrome of right side 02/02/2012  . Iron deficiency anemia due to chronic blood loss 03/14/2016  . Left bundle branch block   . Lumbar stenosis   . Meralgia paraesthetica 12/05/2016  . Neuropathy   . OSA (obstructive sleep apnea) 05/24/2016   Overview:  Managed PULM  . PONV (postoperative nausea and vomiting)    "no N/V with patch"  . Restless leg syndrome   . S/P lumbar laminectomy 11/26/2015  . S/P lumbar spinal fusion 08/29/2016  . Tinnitus 12/05/2016  . Type 2 diabetes mellitus ([place])     Past Surgical History:  Procedure Laterality Date  . ABDOMINAL HYSTERECTOMY  1983  . APPENDECTOMY  Age 16  . BACK SURGERY     FIRST LUMBAR FUSION/ SURGERY APRIL 2012 AND FUSION WITH INSTRUMENTATION SEPT 2012  . CARDIAC CATHETERIZATION     x 2  . CARPAL TUNNEL RELEASE     bil  . CHOLECYSTECTOMY  1990's  . CYSTO EXTRACTION KIDNEY STONES    . EXCISION/RELEASE BURSA HIP  02/02/2012   Procedure: EXCISION/RELEASE BURSA HIP;  Surgeon: Tobi Bastos, MD;  Location: [facility];  Service: Orthopedics;  Laterality: Right;  Right Hip Bursectomy  . EYE SURGERY Bilateral     cataracts  . KIDNEY STONE SURGERY  2008  . Left knee surgery x 2  1996   reconstruction  . LUMBAR DISC SURGERY  08/2016  . LUMBAR LAMINECTOMY/DECOMPRESSION MICRODISCECTOMY Right 11/26/2015   Procedure: Extraforaminal Microdiscectomy  - Lumbar two-three- right;  Surgeon: Eustace Moore, MD;  Location: [place] [facility];  Service: Neurosurgery;  Laterality: Right;  right   . LUMBAR WOUND DEBRIDEMENT N/A 10/25/2016   Procedure: Lumbar wound revision;  Surgeon: Eustace Moore, MD;  Location: [place];  Service: Neurosurgery;  Laterality: N/A;  Lumbar wound revision  . Right shoulder surgery  2010   spur     Home Medications: (pending verification at time of original write-up) Prior to Admission medications   Medication Sig Start Date End Date Taking? Authorizing Provider  allopurinol (ZYLOPRIM) 100 MG tablet Take 100 mg by mouth  daily. 10/20/19   [provider]  aspirin 81 MG tablet Take 81 mg by mouth at bedtime.     [provider]  carvedilol (COREG) 6.25 MG tablet Take 6.25 mg by mouth 2 (two) times daily.  07/09/17   [provider]  DULoxetine (CYMBALTA) 60 MG capsule Take 60 mg by mouth at bedtime.     [provider]  GLUCOSAMINE-CHONDROITIN PO Take 1 tablet by mouth daily.    [provider]  insulin NPH-regular Human (NOVOLIN 70/30) (70-30) 100 UNIT/ML injection Inject 100 Units into the skin 3 (three) times daily.     [provider]  mirtazapine (REMERON) 15 MG tablet Take 15 mg by mouth at bedtime.    [provider]  Multiple Vitamin (MULTIVITAMIN) tablet Take 1 tablet by mouth daily.    [provider]  nitroGLYCERIN (NITROSTAT) 0.4 MG SL tablet Place 0.4 mg under the tongue every 5 (five) minutes as needed for chest pain.     [provider]  ONETOUCH VERIO test strip USE 1 STRIP TO CHECK GLUCOSE 4 TIMES DAILY AS DIRECTED 02/07/19   [provider]  oxybutynin (DITROPAN) 5 MG tablet Take 5 mg by mouth  daily.    [provider]  pantoprazole (PROTONIX) 40 MG tablet Take 1 tablet by mouth once daily 05/03/20   Richardo Priest, MD  pramipexole (MIRAPEX) 1 MG tablet Take 1 mg by mouth at bedtime.    [provider]  solifenacin (VESICARE) 5 MG tablet Take 5 mg by mouth every morning. 02/16/20   [provider]  torsemide (DEMADEX) 20 MG tablet TAKE 1 TABLET BY MOUTH TWICE DAILY. TAKE ONCE A DAY FOR WEIGHT <200 LBS 10/06/19   Richardo Priest, MD  TURMERIC CURCUMIN PO Take 1 tablet by mouth daily.    [provider]  valACYclovir (VALTREX) 1000 MG tablet Take 1,000 mg by mouth 2 (two) times daily as needed.    [provider]    Inpatient Medications: Scheduled Meds: . triamcinolone acetonide  10 mg Other Once  . triamcinolone acetonide  10 mg Other Once   Continuous Infusions:  PRN Meds:   Allergies:    Allergies  Allergen Reactions  . Atorvastatin Nausea And Vomiting and Other (See Comments)    MYALGIAS  . Talwin [Pentazocine] Other (See Comments)    headache  . Other Nausea Only    UNSPECIFIED Anesthesia    Social History:   Social History   Socioeconomic History  . Marital status: Married    Spouse name: Not on file  . Number of children: Not on file  . Years of education: Not on file  . Highest education level: Not on file  Occupational History  . Occupation: Archivist  Tobacco Use  . Smoking status: Never Smoker  . Smokeless tobacco: Never Used  . Tobacco comment: Prior secondhand smoke  Vaping Use  . Vaping Use: Never used  Substance and Sexual Activity  . Alcohol use: Yes    Alcohol/week: 0.0 standard drinks    Comment: Rarely  . Drug use: No  . Sexual activity: Not on file  Other Topics Concern  . Not on file  Social History Narrative  . Not on file   Social Determinants of Health   Financial Resource Strain: Not on file  Food Insecurity: Not on file  Transportation Needs: Not on file   Physical Activity: Not on file  Stress: Not on file  Social Connections:  Not on file  Intimate Partner Violence: Not on file    Family History:   Family History  Problem Relation Age of Onset  . Lung cancer Father        smoked  . Hypertension Father   . Stroke Father   . Heart failure Mother   . Bone cancer Sister   . Asthma Sister      ROS:  Please see the history of present illness.  All other ROS reviewed and negative.     Physical Exam/Data:   Vitals:   10/04/20 1548 10/04/20 1550  BP: 132/79   Pulse: 71   Resp: 18   Temp: 98.2 F (36.8 C)   TempSrc: Oral   SpO2: 99%   Weight:  99.8 kg  Height:  5\' 2"  (1.575 m)    Intake/Output Summary (Last 24 hours) at 10/04/2020 1701 Last data filed at 10/04/2020 1534 Gross per 24 hour  Intake 3003.35 ml  Output --  Net 3003.35 ml   Last 3 Weights 10/04/2020 03/04/2020 01/21/2020  Weight (lbs) 220 lb 217 lb 218 lb 3.2 oz  Weight (kg) 99.791 kg 98.431 kg 98.975 kg     Body mass index is 40.24 kg/m.  General: Well developed, well nourished obese WF in no acute distress. Comfortable appearing. Head: Normocephalic, atraumatic, sclera non-icteric, no xanthomas, nares are without discharge. Neck: Negative for carotid bruits. JVP not elevated. Lungs: Clear bilaterally to auscultation without wheezes, rales, or rhonchi. Breathing is unlabored. Heart: RRR S1 S2 without murmurs, rubs, or gallops.  Abdomen: Soft, non-tender, non-distended with normoactive bowel sounds. No rebound/guarding. Extremities: No clubbing or cyanosis. Trace ankle edema. Distal pedal pulses are 2+ and equal bilaterally. Neuro: Alert and oriented X 3. Moves all extremities spontaneously. Psych:  Responds to questions appropriately with a normal affect.  EKG:  The EKG was personally reviewed and demonstrates: NSR 72bpm with LBBB and TWI I, avL, V6, no significant change from prior  Telemetry:  Telemetry not yet available  Relevant CV Studies: 2D Echo  04/2019 1. The left ventricle has normal systolic function with an ejection  fraction of 60-65%. The cavity size was normal. There is mildly increased  left ventricular wall thickness. Left ventricular diastolic Doppler  parameters are consistent with impaired  relaxation.  2. The right ventricle has normal systolic function. The cavity was  normal. There is no increase in right ventricular wall thickness.  3. The aortic root is normal in size and structure.   Nuc 04/2019  The left ventricular ejection fraction is normal (55-65%).  Nuclear stress EF: 56%.  There was no ST segment deviation noted during stress.  The study is normal.  This is a low risk study    Laboratory Data:  High Sensitivity Troponin:   Recent Labs  Lab 10/04/20 1555  TROPONINIHS 11     Chemistry Recent Labs  Lab 10/04/20 1555  NA 137  K 3.6  CL 98  CO2 28  GLUCOSE 288*  BUN 25*  CREATININE 1.26*  CALCIUM 9.2  GFRNONAA 48*  ANIONGAP 11    No results for input(s): PROT, ALBUMIN, AST, ALT, ALKPHOS, BILITOT in the last 168 hours. Hematology Recent Labs  Lab 10/04/20 1555  WBC 5.8  RBC 3.48*  HGB 11.8*  HCT 34.8*  MCV 100.0  MCH 33.9  MCHC 33.9  RDW 12.9  PLT 164   BNP Recent Labs  Lab 10/04/20 1555  BNP 32.9    DDimer  Recent Labs  Lab 10/04/20  1555  DDIMER 0.30     Radiology/Studies:  DG Chest Portable 1 View  Result Date: 10/04/2020 CLINICAL DATA:  Chest pain, dyspnea. EXAM: PORTABLE CHEST 1 VIEW COMPARISON:  06/13/2019 and prior. FINDINGS: Mild hypoinflation. Linear right basilar opacity, likely atelectasis versus superimposition artifact. No pneumothorax or pleural effusion. Cardiomediastinal silhouette is within normal limits. Multilevel spondylosis. Partially imaged spinal fusion hardware. IMPRESSION: No focal airspace disease. Electronically Signed   By: Stana Bunting M.D.   On: 10/04/2020 16:12     Assessment and Plan:   1. Chest pain with acute on  chronic dyspnea 2. Mild CAD by cath 2014 3. CKD stage IIIa, stable by labs 4. History of anemia with mild decrease in Hgb compared to prior although similar to previous years 5. OSA with morbid obesity, intolerant to CPAP/BIPAP 6. Hypertensive heart disease 7. IDDM 8. Depression  Further recs pending outcome of ED workup.      HEAR Score (for undifferentiated chest pain):  HEAR Score: 6     For questions or updates, please contact [facility] Please consult [email or web address] for contact info under   Severity of Illness: The appropriate patient status for this patient is OBSERVATION. Observation status is judged to be reasonable and necessary in order to provide the required intensity of service to ensure the patient's safety. The patient's presenting symptoms, physical exam findings, and initial radiographic and laboratory data in the context of their medical condition is felt to place them at decreased risk for further clinical deterioration. Furthermore, it is anticipated that the patient will be medically stable for discharge from the hospital within 2 midnights of admission. The following factors support the patient status of observation.   " The patient's presenting symptoms include SOB. " The physical exam findings include benign cardiopulmonary exam. " The initial radiographic and laboratory data are benign work-up - COVID-19/flu pending.     Signed, Laurann Montana, PA-C (initial consult begun prior to availability of labwork/imaging) 10/04/2020 5:01 PM   History and all data above reviewed.  Patient examined.  I agree with the findings as above. Asked to see this patient initially because the ED thought her EKG reflected an acute MI.  However, she has a chronic LBBB.  She presented with progressive DOE.  This has been on going for about a year but probably worse in the last several days.   She describes a chronic mucous producing cough but no fevers or chills.  She reports DOE  after walking several feet on level ground.  She chronically sleeps with her head elevated.  She has had a pulmonary work up without clear pulmonary diagnosis forth coming.  She has had cath in 2014 without obstructive disease but with 60% circumflex lesion.  She had a negative perfusion study in 2020.  She complains of pain in her back between the shoulder blades.  She reports some left arm discomfort.  She also complains of her legs being heavy.  The patient exam reveals COR:RRR  ,  Lungs: Clear  ,  Abd: Positive bowel sounds, no rebound no guarding, Ext No edema  .  All available labs, radiology testing, previous records reviewed. Agree with documented assessment and plan.   Chest pain:  This is predominantly back pain but with some discomfort in the chest and left arm.  No objective evidence of ischemia.  She did have non obstructive disease several years ago on cath.  We will observe overnight and keep NPO.  Given the worsening symptoms I  will consider right and left cath vs coronary CT pending futher troponin results.    Minus Breeding  6:56 PM  10/04/2020

## 2020-10-04 NOTE — ED Provider Notes (Signed)
[place] EMERGENCY DEPARTMENT Provider Note   CSN: [id number] Arrival date & time: 10/04/20  1536     History Chief Complaint  Patient presents with  . Code STEMI    Katelyn [name] is a 64 y.o. female.  HPI 64 year old female presents as a code STEMI. Patient comes via EMS. Has chronic dyspnea, worse over past few days, especially exertionally. Has some chest discomfort, but mostly thoracic (bra line) pressure/tightness. Also worsens with exertion. Trace ankle swelling. Some chronic cough, unchanged. No fevers.    Past Medical History:  Diagnosis Date  . Anemia    previous transfusion 2/17  . Anxiety   . Arthritis   . Benign hypertension 03/14/2016  . Bursitis of right hip   . CAD (coronary artery disease)    Cardiac catheterization June 2014 in [facility] - 50% circumflex stenosis  . Chronic kidney disease, stage 3 (HCC)    does not see nephrologist  . Chronic low back pain without sciatica 03/14/2016  . Complication of anesthesia   . Cough 04/19/2017   Overview:  Last Assessment & Plan:  Formatting of this note may be different from the original. Cough - ? ACE related with AR triggers   Plan  Patient Instructions  Discuss with your primary doctor that lisinopril pain, need making your cough worse. May use Mucinex DM twice daily as needed for cough and congestion Zyrtec 10 mg at bedtime as needed for drainage Saline nasal spray as needed. Lab tests today Activity as tolerated. Follow with Dr. [name] in 3-4 months and As needed   Please contact office for sooner follow up if symptoms do not improve or worsen or seek emergency care   . Depression   . Dyspnea    with exertion   " lazy lung" - per  [name] from back issues- 06/2016  . Elevated liver enzymes 12/05/2016  . Essential hypertension   . GERD (gastroesophageal reflux disease)   . Gout 03/14/2016  . Greater trochanteric bursitis of right hip 02/02/2012  . H/O hiatal hernia   . Heart murmur   . History of  blood transfusion 2016  . History of kidney stones   . Hypercholesterolemia   . Iliotibial band syndrome of right side 02/02/2012  . Iron deficiency anemia due to chronic blood loss 03/14/2016  . Left bundle branch block   . Lumbar stenosis   . Meralgia paraesthetica 12/05/2016  . Neuropathy   . OSA (obstructive sleep apnea) 05/24/2016   Overview:  Managed PULM  . PONV (postoperative nausea and vomiting)    "no N/V with patch"  . Restless leg syndrome   . S/P lumbar laminectomy 11/26/2015  . S/P lumbar spinal fusion 08/29/2016  . Tinnitus 12/05/2016  . Type 2 diabetes mellitus [facility])     Patient Active Problem List   Diagnosis Date Noted  . Shortness of breath 06/11/2019  . Chronic diastolic heart failure ([place]) 08/20/2017  . Genetic testing 08/17/2017  . Family history of ovarian cancer 08/17/2017  . Family history of breast cancer 08/17/2017  . Cough 04/19/2017  . DOE (dyspnea on exertion) 04/19/2017  . Hypertensive heart disease with heart failure ([place]) 01/01/2017  . Hyperlipemia 01/01/2017  . LBBB (left bundle branch block) 01/01/2017  . Elevated liver enzymes 12/05/2016  . Meralgia paraesthetica 12/05/2016  . Tinnitus 12/05/2016  . S/P lumbar spinal fusion 08/29/2016  . OSA (obstructive sleep apnea) 05/24/2016  . 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  . Chronic low  back pain without sciatica 03/14/2016  . GERD (gastroesophageal reflux disease) 03/14/2016  . Gout 03/14/2016  . Iron deficiency anemia due to chronic blood loss 03/14/2016  . Type 2 diabetes mellitus without complication, with long-term current use of insulin (HCC) 03/14/2016  . Cannot sleep 12/07/2015  . 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HCC) 12/07/2015  . Restless leg 12/07/2015  . S/P lumbar laminectomy 11/26/2015  . Postprocedural state 11/26/2015  . Mild CAD 11/24/2015  . Greater trochanteric bursitis of right hip 02/02/2012  . Iliotibial band syndrome of right side 02/02/2012  . Iliotibial band  syndrome 02/02/2012  . Bursitis, trochanteric 02/02/2012    Past Surgical History:  Procedure Laterality Date  . ABDOMINAL HYSTERECTOMY  1983  . APPENDECTOMY  Age 38  . BACK SURGERY     FIRST LUMBAR FUSION/ SURGERY APRIL 2012 AND FUSION WITH INSTRUMENTATION SEPT 2012  . CARDIAC CATHETERIZATION     x 2  . CARPAL TUNNEL RELEASE     bil  . CHOLECYSTECTOMY  1990's  . CYSTO EXTRACTION KIDNEY STONES    . EXCISION/RELEASE BURSA HIP  02/02/2012   Procedure: EXCISION/RELEASE BURSA HIP;  Surgeon: Jacki Cones, MD;  Location: [facility] ORS;  Service: Orthopedics;  Laterality: Right;  Right Hip Bursectomy  . EYE SURGERY Bilateral    cataracts  . KIDNEY STONE SURGERY  2008  . Left knee surgery x 2  1996   reconstruction  . LUMBAR DISC SURGERY  08/2016  . LUMBAR LAMINECTOMY/DECOMPRESSION MICRODISCECTOMY Right 11/26/2015   Procedure: Extraforaminal Microdiscectomy  - Lumbar two-three- right;  Surgeon: Tia Alert, MD;  Location: [facility];  Service: Neurosurgery;  Laterality: Right;  right   . LUMBAR WOUND DEBRIDEMENT N/A 10/25/2016   Procedure: Lumbar wound revision;  Surgeon: Tia Alert, MD;  Location: [facility] OR;  Service: Neurosurgery;  Laterality: N/A;  Lumbar wound revision  . Right shoulder surgery  2010   spur     OB History   No obstetric history on file.     Family History  Problem Relation Age of Onset  . Lung cancer Father        smoked  . Hypertension Father   . Stroke Father   . Heart failure Mother   . Bone cancer Sister   . Asthma Sister     Social History   Tobacco Use  . Smoking status: Never Smoker  . Smokeless tobacco: Never Used  Vaping Use  . Vaping Use: Never used  Substance Use Topics  . Alcohol use: Yes    Alcohol/week: 0.0 standard drinks    Comment: Rarely  . Drug use: No    Home Medications Prior to Admission medications   Medication Sig Start Date End Date Taking? Authorizing Provider  allopurinol (ZYLOPRIM) 100 MG tablet Take 100 mg by  mouth daily. 10/20/19   [provider]  aspirin 81 MG tablet Take 81 mg by mouth at bedtime.     [provider]  carvedilol (COREG) 6.25 MG tablet Take 6.25 mg by mouth 2 (two) times daily.  07/09/17   [provider]  DULoxetine (CYMBALTA) 60 MG capsule Take 60 mg by mouth at bedtime.     [provider]  GLUCOSAMINE-CHONDROITIN PO Take 1 tablet by mouth daily.    [provider]  insulin NPH-regular Human (NOVOLIN 70/30) (70-30) 100 UNIT/ML injection Inject 100 Units into the skin 3 (three) times daily.     [provider]  mirtazapine (REMERON) 15 MG tablet  Take 15 mg by mouth at bedtime.    [provider]  Multiple Vitamin (MULTIVITAMIN) tablet Take 1 tablet by mouth daily.    [provider]  nitroGLYCERIN (NITROSTAT) 0.4 MG SL tablet Place 0.4 mg under the tongue every 5 (five) minutes as needed for chest pain.     [provider]  ONETOUCH VERIO test strip USE 1 STRIP TO CHECK GLUCOSE 4 TIMES DAILY AS DIRECTED 02/07/19   [provider]  oxybutynin (DITROPAN) 5 MG tablet Take 5 mg by mouth daily.    [provider]  pantoprazole (PROTONIX) 40 MG tablet Take 1 tablet by mouth once daily 05/03/20   Richardo Priest, MD  pramipexole (MIRAPEX) 1 MG tablet Take 1 mg by mouth at bedtime.    [provider]  solifenacin (VESICARE) 5 MG tablet Take 5 mg by mouth every morning. 02/16/20   [provider]  torsemide (DEMADEX) 20 MG tablet TAKE 1 TABLET BY MOUTH TWICE DAILY. TAKE ONCE A DAY FOR WEIGHT <200 LBS 10/06/19   Richardo Priest, MD  TURMERIC CURCUMIN PO Take 1 tablet by mouth daily.    [provider]  valACYclovir (VALTREX) 1000 MG tablet Take 1,000 mg by mouth 2 (two) times daily as needed.    [provider]    Allergies    Atorvastatin, Talwin [pentazocine], and Other  Review of Systems   Review of Systems  Constitutional: Negative for fever.   Respiratory: Positive for cough (chronic) and shortness of breath.   Cardiovascular: Positive for leg swelling (trace, ankles). Negative for chest pain.  Musculoskeletal: Positive for back pain.  All other systems reviewed and are negative.   Physical Exam Updated Vital Signs BP 132/79   Pulse 71   Temp 98.2 F (36.8 C) (Oral)   Resp 18   Ht 5\' 2"  (1.575 m)   Wt 99.8 kg   SpO2 99%   BMI 40.24 kg/m   Physical Exam Vitals and nursing note reviewed.  Constitutional:      Appearance: She is well-developed and well-nourished. She is obese.  HENT:     Head: Normocephalic and atraumatic.     Right Ear: External ear normal.     Left Ear: External ear normal.     Nose: Nose normal.  Eyes:     General:        Right eye: No discharge.        Left eye: No discharge.  Cardiovascular:     Rate and Rhythm: Normal rate and regular rhythm.     Heart sounds: Normal heart sounds.  Pulmonary:     Effort: Pulmonary effort is normal.     Breath sounds: Normal breath sounds.  Abdominal:     Palpations: Abdomen is soft.     Tenderness: There is no abdominal tenderness.  Musculoskeletal:     Right lower leg: No edema.     Left lower leg: No edema.  Skin:    General: Skin is warm and dry.  Neurological:     Mental Status: She is alert.  Psychiatric:        Mood and Affect: Mood is not anxious.     ED Results / Procedures / Treatments   Labs (all labs ordered are listed, but only abnormal results are displayed) Labs Reviewed  BASIC METABOLIC PANEL  BRAIN NATRIURETIC PEPTIDE  CBC WITH DIFFERENTIAL/PLATELET  D-DIMER, QUANTITATIVE (NOT AT [facility])  TROPONIN I (HIGH SENSITIVITY)    EKG EKG Interpretation  Date/Time:  Monday October 04 2020 15:48:24 EST Ventricular Rate:  72 PR Interval:    QRS Duration: 142 QT Interval:  456 QTC Calculation: 500 R Axis:   -8 Text Interpretation: Sinus rhythm Left bundle branch block Confirmed by Sherwood Gambler [phone]) on 10/04/2020 4:01:33  PM   Radiology No results found.  Procedures Procedures (including critical care time)  Medications Ordered in ED Medications - No data to display  ED Course  I have reviewed the triage vital signs and the nursing notes.  Pertinent labs & imaging results that were available during my care of the patient were reviewed by me and considered in my medical decision making (see chart for details).    MDM Rules/Calculators/A&P                          STEMI alert cancelled by Dr. Angelena Form. Will do CP workup. Doubt dissection. Will get ddimer. Care to Dr. Tamera Punt.  Final Clinical Impression(s) / ED Diagnoses Final diagnoses:  None    Rx / DC Orders ED Discharge Orders    None       Sherwood Gambler, MD 10/04/20 [id number]

## 2020-10-05 ENCOUNTER — Encounter (HOSPITAL_COMMUNITY)
Admission: EM | Disposition: A | Discharge status: Home Health | Payer: Self-pay | Source: Home / Self Care | Attending: Cardiology

## 2020-10-05 DIAGNOSIS — N390 Urinary tract infection, site not specified: Secondary | ICD-10-CM | POA: Diagnosis not present

## 2020-10-05 DIAGNOSIS — J9611 Chronic respiratory failure with hypoxia: Secondary | ICD-10-CM | POA: Diagnosis not present

## 2020-10-05 DIAGNOSIS — J984 Other disorders of lung: Secondary | ICD-10-CM | POA: Diagnosis present

## 2020-10-05 DIAGNOSIS — I270 Primary pulmonary hypertension: Secondary | ICD-10-CM | POA: Diagnosis not present

## 2020-10-05 DIAGNOSIS — K219 Gastro-esophageal reflux disease without esophagitis: Secondary | ICD-10-CM | POA: Diagnosis present

## 2020-10-05 DIAGNOSIS — I130 Hypertensive heart and chronic kidney disease with heart failure and stage 1 through stage 4 chronic kidney disease, or unspecified chronic kidney disease: Secondary | ICD-10-CM | POA: Diagnosis not present

## 2020-10-05 DIAGNOSIS — I159 Secondary hypertension, unspecified: Secondary | ICD-10-CM | POA: Diagnosis not present

## 2020-10-05 DIAGNOSIS — I447 Left bundle-branch block, unspecified: Secondary | ICD-10-CM | POA: Diagnosis present

## 2020-10-05 DIAGNOSIS — G4733 Obstructive sleep apnea (adult) (pediatric): Secondary | ICD-10-CM | POA: Diagnosis not present

## 2020-10-05 DIAGNOSIS — I200 Unstable angina: Secondary | ICD-10-CM | POA: Diagnosis not present

## 2020-10-05 DIAGNOSIS — N1831 Chronic kidney disease, stage 3a: Secondary | ICD-10-CM | POA: Diagnosis present

## 2020-10-05 DIAGNOSIS — I2510 Atherosclerotic heart disease of native coronary artery without angina pectoris: Secondary | ICD-10-CM

## 2020-10-05 DIAGNOSIS — E785 Hyperlipidemia, unspecified: Secondary | ICD-10-CM | POA: Diagnosis present

## 2020-10-05 DIAGNOSIS — I272 Pulmonary hypertension, unspecified: Secondary | ICD-10-CM | POA: Diagnosis not present

## 2020-10-05 DIAGNOSIS — R0982 Postnasal drip: Secondary | ICD-10-CM | POA: Diagnosis present

## 2020-10-05 DIAGNOSIS — R0902 Hypoxemia: Secondary | ICD-10-CM | POA: Diagnosis present

## 2020-10-05 DIAGNOSIS — I2699 Other pulmonary embolism without acute cor pulmonale: Secondary | ICD-10-CM | POA: Diagnosis not present

## 2020-10-05 DIAGNOSIS — R079 Chest pain, unspecified: Secondary | ICD-10-CM | POA: Diagnosis present

## 2020-10-05 DIAGNOSIS — I5032 Chronic diastolic (congestive) heart failure: Secondary | ICD-10-CM | POA: Diagnosis not present

## 2020-10-05 DIAGNOSIS — E1140 Type 2 diabetes mellitus with diabetic neuropathy, unspecified: Secondary | ICD-10-CM | POA: Diagnosis present

## 2020-10-05 DIAGNOSIS — E1122 Type 2 diabetes mellitus with diabetic chronic kidney disease: Secondary | ICD-10-CM | POA: Diagnosis present

## 2020-10-05 DIAGNOSIS — M109 Gout, unspecified: Secondary | ICD-10-CM | POA: Diagnosis present

## 2020-10-05 DIAGNOSIS — D539 Nutritional anemia, unspecified: Secondary | ICD-10-CM | POA: Diagnosis present

## 2020-10-05 DIAGNOSIS — R0602 Shortness of breath: Secondary | ICD-10-CM | POA: Diagnosis not present

## 2020-10-05 DIAGNOSIS — E1165 Type 2 diabetes mellitus with hyperglycemia: Secondary | ICD-10-CM | POA: Diagnosis not present

## 2020-10-05 DIAGNOSIS — Z6841 Body Mass Index (BMI) 40.0 and over, adult: Secondary | ICD-10-CM | POA: Diagnosis not present

## 2020-10-05 DIAGNOSIS — J9612 Chronic respiratory failure with hypercapnia: Secondary | ICD-10-CM | POA: Diagnosis present

## 2020-10-05 DIAGNOSIS — R072 Precordial pain: Secondary | ICD-10-CM | POA: Diagnosis not present

## 2020-10-05 DIAGNOSIS — R531 Weakness: Secondary | ICD-10-CM | POA: Diagnosis present

## 2020-10-05 DIAGNOSIS — Z20822 Contact with and (suspected) exposure to covid-19: Secondary | ICD-10-CM | POA: Diagnosis present

## 2020-10-05 DIAGNOSIS — R0789 Other chest pain: Secondary | ICD-10-CM | POA: Diagnosis not present

## 2020-10-05 DIAGNOSIS — E662 Morbid (severe) obesity with alveolar hypoventilation: Secondary | ICD-10-CM | POA: Diagnosis not present

## 2020-10-05 DIAGNOSIS — K222 Esophageal obstruction: Secondary | ICD-10-CM | POA: Diagnosis present

## 2020-10-05 DIAGNOSIS — R0600 Dyspnea, unspecified: Secondary | ICD-10-CM | POA: Diagnosis not present

## 2020-10-05 HISTORY — PX: RIGHT/LEFT HEART CATH AND CORONARY ANGIOGRAPHY: CATH118266

## 2020-10-05 LAB — LIPID PANEL
Cholesterol: 184 mg/dL (ref 0–200)
HDL: 44 mg/dL (ref >40)
LDL Cholesterol: 114 mg/dL — ABNORMAL HIGH (ref 0–99)
Total CHOL/HDL Ratio: 4.2 RATIO
Triglycerides: 130 mg/dL (ref <150)
VLDL: 26 mg/dL (ref 0–40)

## 2020-10-05 LAB — GLUCOSE, CAPILLARY
Glucose-Capillary: 129 mg/dL — ABNORMAL HIGH (ref 70–99)
Glucose-Capillary: 153 mg/dL — ABNORMAL HIGH (ref 70–99)
Glucose-Capillary: 127 mg/dL — ABNORMAL HIGH (ref 70–99)
Glucose-Capillary: 278 mg/dL — ABNORMAL HIGH (ref 70–99)

## 2020-10-05 LAB — POCT I-STAT EG7
Acid-Base Excess: 6 mmol/L — ABNORMAL HIGH (ref 0.0–2.0)
Bicarbonate: 33.3 mmol/L — ABNORMAL HIGH (ref 20.0–28.0)
Calcium, Ion: 1.21 mmol/L (ref 1.15–1.40)
HCT: 35 % — ABNORMAL LOW (ref 36.0–46.0)
Hemoglobin: 11.9 g/dL — ABNORMAL LOW (ref 12.0–15.0)
O2 Saturation: 66 %
Potassium: 3.8 mmol/L (ref 3.5–5.1)
Sodium: 141 mmol/L (ref 135–145)
TCO2: 35 mmol/L — ABNORMAL HIGH (ref 22–32)
pCO2, Ven: 58.7 mmHg (ref 44.0–60.0)
pH, Ven: 7.361 (ref 7.250–7.430)
pO2, Ven: 37 mmHg (ref 32.0–45.0)

## 2020-10-05 LAB — CBC
HCT: 34.1 % — ABNORMAL LOW (ref 36.0–46.0)
Hemoglobin: 11.8 g/dL — ABNORMAL LOW (ref 12.0–15.0)
MCH: 34.8 pg — ABNORMAL HIGH (ref 26.0–34.0)
MCHC: 34.6 g/dL (ref 30.0–36.0)
MCV: 100.6 fL — ABNORMAL HIGH (ref 80.0–100.0)
Platelets: 164 10*3/uL (ref 150–400)
RBC: 3.39 MIL/uL — ABNORMAL LOW (ref 3.87–5.11)
RDW: 13.2 % (ref 11.5–15.5)
WBC: 5.6 10*3/uL (ref 4.0–10.5)
nRBC: 0 % (ref 0.0–0.2)

## 2020-10-05 LAB — POCT I-STAT 7, (LYTES, BLD GAS, ICA,H+H)
Acid-Base Excess: 6 mmol/L — ABNORMAL HIGH (ref 0.0–2.0)
Bicarbonate: 32.1 mmol/L — ABNORMAL HIGH (ref 20.0–28.0)
Calcium, Ion: 1.17 mmol/L (ref 1.15–1.40)
HCT: 35 % — ABNORMAL LOW (ref 36.0–46.0)
Hemoglobin: 11.9 g/dL — ABNORMAL LOW (ref 12.0–15.0)
O2 Saturation: 99 %
Potassium: 3.8 mmol/L (ref 3.5–5.1)
Sodium: 140 mmol/L (ref 135–145)
TCO2: 34 mmol/L — ABNORMAL HIGH (ref 22–32)
pCO2 arterial: 50.7 mmHg — ABNORMAL HIGH (ref 32.0–48.0)
pH, Arterial: 7.409 (ref 7.350–7.450)
pO2, Arterial: 116 mmHg — ABNORMAL HIGH (ref 83.0–108.0)

## 2020-10-05 LAB — BASIC METABOLIC PANEL
Anion gap: 12 (ref 5–15)
BUN: 25 mg/dL — ABNORMAL HIGH (ref 8–23)
CO2: 28 mmol/L (ref 22–32)
Calcium: 9 mg/dL (ref 8.9–10.3)
Chloride: 99 mmol/L (ref 98–111)
Creatinine, Ser: 1.16 mg/dL — ABNORMAL HIGH (ref 0.44–1.00)
GFR, Estimated: 53 mL/min — ABNORMAL LOW (ref >60)
Glucose, Bld: 125 mg/dL — ABNORMAL HIGH (ref 70–99)
Potassium: 3.5 mmol/L (ref 3.5–5.1)
Sodium: 139 mmol/L (ref 135–145)

## 2020-10-05 LAB — HEMOGLOBIN A1C
Hgb A1c MFr Bld: 9.5 % — ABNORMAL HIGH (ref 4.8–5.6)
Mean Plasma Glucose: 225.95 mg/dL

## 2020-10-05 LAB — CREATININE, SERUM
Creatinine, Ser: 1.11 mg/dL — ABNORMAL HIGH (ref 0.44–1.00)
GFR, Estimated: 56 mL/min — ABNORMAL LOW (ref >60)

## 2020-10-05 SURGERY — RIGHT/LEFT HEART CATH AND CORONARY ANGIOGRAPHY
Anesthesia: LOCAL

## 2020-10-05 MED ORDER — SODIUM CHLORIDE 0.9 % IV SOLN: 250.0000 mL | INTRAVENOUS | Status: DC | PRN

## 2020-10-05 MED ORDER — FENTANYL CITRATE (PF) 100 MCG/2ML IJ SOLN
INTRAMUSCULAR | Status: DC | PRN
  Administered 2020-10-05: 25 ug via INTRAVENOUS

## 2020-10-05 MED ORDER — HEPARIN (PORCINE) IN NACL 1000-0.9 UT/500ML-% IV SOLN
INTRAVENOUS | Status: DC | PRN
  Administered 2020-10-05: 500 mL

## 2020-10-05 MED ORDER — HEPARIN (PORCINE) IN NACL 1000-0.9 UT/500ML-% IV SOLN
INTRAVENOUS | Status: AC
  Filled 2020-10-05: qty 1000

## 2020-10-05 MED ORDER — ROSUVASTATIN CALCIUM 5 MG PO TABS
10.0000 mg | ORAL_TABLET | Freq: Every day | ORAL | Status: DC
  Administered 2020-10-05 – 2020-10-07 (×3): 10 mg via ORAL
  Filled 2020-10-05 (×3): qty 2

## 2020-10-05 MED ORDER — ASPIRIN 81 MG PO CHEW: 81.0000 mg | CHEWABLE_TABLET | ORAL | Status: AC

## 2020-10-05 MED ORDER — HEPARIN SODIUM (PORCINE) 1000 UNIT/ML IJ SOLN
INTRAMUSCULAR | Status: AC
  Filled 2020-10-05: qty 1

## 2020-10-05 MED ORDER — SODIUM CHLORIDE 0.9% FLUSH: 3.0000 mL | INTRAVENOUS | Status: DC | PRN

## 2020-10-05 MED ORDER — ENOXAPARIN SODIUM 40 MG/0.4ML ~~LOC~~ SOLN
40.0000 mg | SUBCUTANEOUS | Status: DC
  Administered 2020-10-06 – 2020-10-07 (×2): 40 mg via SUBCUTANEOUS
  Filled 2020-10-05 (×2): qty 0.4

## 2020-10-05 MED ORDER — LIDOCAINE HCL (PF) 1 % IJ SOLN
INTRAMUSCULAR | Status: AC
  Filled 2020-10-05: qty 30

## 2020-10-05 MED ORDER — MIDAZOLAM HCL 2 MG/2ML IJ SOLN
INTRAMUSCULAR | Status: AC
  Filled 2020-10-05: qty 2

## 2020-10-05 MED ORDER — HEPARIN SODIUM (PORCINE) 1000 UNIT/ML IJ SOLN
INTRAMUSCULAR | Status: DC | PRN
  Administered 2020-10-05: 5000 [IU] via INTRAVENOUS

## 2020-10-05 MED ORDER — MIDAZOLAM HCL 2 MG/2ML IJ SOLN
INTRAMUSCULAR | Status: DC | PRN
  Administered 2020-10-05: 1 mg via INTRAVENOUS

## 2020-10-05 MED ORDER — VERAPAMIL HCL 2.5 MG/ML IV SOLN
INTRAVENOUS | Status: DC | PRN
  Administered 2020-10-05: 15:00:00 10 mL via INTRA_ARTERIAL

## 2020-10-05 MED ORDER — SODIUM CHLORIDE 0.9% FLUSH
3.0000 mL | Freq: Two times a day (BID) | INTRAVENOUS | Status: DC
  Administered 2020-10-05 – 2020-10-07 (×5): 3 mL via INTRAVENOUS

## 2020-10-05 MED ORDER — SODIUM CHLORIDE 0.9 % IV SOLN: INTRAVENOUS | Status: DC

## 2020-10-05 MED ORDER — VERAPAMIL HCL 2.5 MG/ML IV SOLN
INTRAVENOUS | Status: AC
  Filled 2020-10-05: qty 2

## 2020-10-05 MED ORDER — FENTANYL CITRATE (PF) 100 MCG/2ML IJ SOLN
INTRAMUSCULAR | Status: AC
  Filled 2020-10-05: qty 2

## 2020-10-05 MED ORDER — LIDOCAINE HCL (PF) 1 % IJ SOLN
INTRAMUSCULAR | Status: DC | PRN
  Administered 2020-10-05 (×2): 2 mL

## 2020-10-05 MED ORDER — IOHEXOL 350 MG/ML SOLN
INTRAVENOUS | Status: DC | PRN
  Administered 2020-10-05: 15:00:00 80 mL

## 2020-10-05 MED ORDER — SODIUM CHLORIDE 0.9% FLUSH
3.0000 mL | Freq: Two times a day (BID) | INTRAVENOUS | Status: DC
  Administered 2020-10-06 – 2020-10-07 (×2): 3 mL via INTRAVENOUS

## 2020-10-05 SURGICAL SUPPLY — 12 items
CATH 5FR JL3.5 JR4 ANG PIG MP (CATHETERS) ×1 IMPLANT
DEVICE RAD COMP TR BAND LRG (VASCULAR PRODUCTS) ×1 IMPLANT
GLIDESHEATH SLEND SS 6F .021 (SHEATH) ×1 IMPLANT
GUIDEWIRE INQWIRE 1.5J.035X260 (WIRE) IMPLANT
INQWIRE 1.5J .035X260CM (WIRE) ×2
KIT HEART LEFT (KITS) ×2 IMPLANT
PACK CARDIAC CATHETERIZATION (CUSTOM PROCEDURE TRAY) ×2 IMPLANT
SHEATH GLIDE SLENDER 4/5FR (SHEATH) ×1 IMPLANT
SHEATH PROBE COVER 6X72 (BAG) ×1 IMPLANT
TRANSDUCER W/STOPCOCK (MISCELLANEOUS) ×2 IMPLANT
TUBING CIL FLEX 10 FLL-RA (TUBING) ×2 IMPLANT
WIRE HI TORQ VERSACORE-J 145CM (WIRE) ×1 IMPLANT

## 2020-10-05 NOTE — Progress Notes (Signed)
TR band has been removed and dressing in place. No signs of bleeding or hematoma.  Will continue to monitor.  Harriet Masson, RN

## 2020-10-05 NOTE — Progress Notes (Signed)
Cath results reviewed with Dr. [place]. Nonobstructive CAD. Did show elevated LV filling pressures, mild pulm HTN, and elevated right atrial pressures. Given her pulm HTN and elevated RA pressures, Dr. [place] recommended to consider pulmonary embolic disease. D-dimer was negative on admission and she is not tachycardic, tachypneic or hypoxic suggestive of acute PE. Per our discussion, this could be sequelae of untreated OSA (pt unable to tolerate CPAP or BIPAP), but will obtain VQ scan for completeness. (CTA less preferred given contrast load with cath/CKD, and need to exclude CTEPH.) He only used 80cc of contrast and won't be hydrating post-cath but he suggests resuming torsemide in AM if Cr stable. 2D echo also ordered. Patient/nurse aware of plan, also updated husband at patient's request.

## 2020-10-05 NOTE — Progress Notes (Signed)
Inpatient Diabetes Program Recommendations  AACE/ADA: New Consensus Statement on Inpatient Glycemic Control (2015)  Target Ranges:  Prepandial:   less than 140 mg/dL      Peak postprandial:   less than 180 mg/dL (1-2 hours)      Critically ill patients:  140 - 180 mg/dL   Lab Results  Component Value Date   GLUCAP 153 (H) 10/05/2020   HGBA1C 9.5 (H) 10/05/2020    Review of Glycemic Control Results for [place]a, NATAUSHA JUNGWIRTH (MRN [id number]) as of 10/05/2020 15:12  Ref. Range 10/04/2020 22:03 10/05/2020 06:09 10/05/2020 11:27  Glucose-Capillary Latest Ref Range: 70 - 99 mg/dL 035 (H) 009 (H) 381 (H)   Diabetes history: DM 2 Outpatient Diabetes medications:  Novolin 70/30 35-100 units tid with meals? Current orders for Inpatient glycemic control:  Novolog moderate tid with meals and HS  Inpatient Diabetes Program Recommendations:    Spoke to patient regarding home DM medications.  Patient states that she took 400 units of 70/30 on 10/03/20 due to blood sugar being >500 mg/dL.   We discussed the dangers of taking too much insulin.  She states she has had lows in the past.  She states that she told Dr. Evlyn Kanner that she sometimes takes more insulin if blood sugars are high.  Recommended that she not take over the 100 units of 70/30 insulin unless discussed with Dr. Evlyn Kanner.  Also discussed that low blood sugars can be dangerous as well and reviewed treatment.  Patient had a sensor in the past however states it was coming off after doing water aerobics at the [facility].  Recommended that patient use sensor when she gets home so that she can monitor closely for both high and low blood sugars.  She verbalized understanding and also states she will f/u with Dr. Evlyn Kanner.    While in the hospital, consider adding Lantus 20 units daily.  Once eating, consider also adding Novolog meal coverage 4 units tid with meals.   Will follow.   Thanks  Beryl Meager, RN, BC-ADM Inpatient Diabetes Coordinator Pager  [phone] (8a-5p)

## 2020-10-05 NOTE — Progress Notes (Addendum)
Progress Note  Patient Name: Katelyn [name] Date of Encounter: 10/05/2020  Primary Cardiologist: Norman Herrlich, MD  Subjective   Reports continued pain in back of neck, sporadically in back, not so much chest this morning. Feels more SOB in general while talking. Very fatigued. Worried about blockage.  Inpatient Medications    Scheduled Meds: . allopurinol  100 mg Oral QHS  . aspirin EC  81 mg Oral Daily  . carvedilol  6.25 mg Oral BID  . DULoxetine  60 mg Oral QHS  . enoxaparin (LOVENOX) injection  40 mg Subcutaneous Q24H  . insulin aspart  0-15 Units Subcutaneous TID WC  . insulin aspart  0-5 Units Subcutaneous QHS  . mirtazapine  15 mg Oral QHS  . pantoprazole  40 mg Oral Daily  . pramipexole  1 mg Oral QHS  . pregabalin  50 mg Oral QHS   Continuous Infusions:  PRN Meds: acetaminophen, nitroGLYCERIN, ondansetron (ZOFRAN) IV   Vital Signs    Vitals:   10/04/20 1930 10/04/20 2325 10/05/20 0201 10/05/20 0538  BP: (!) 131/52 (!) 106/48 127/66 124/61  Pulse: 72 71 69 66  Resp: 14 15 16    Temp:  97.8 F (36.6 C) 97.9 F (36.6 C) 97.9 F (36.6 C)  TempSrc:   Oral Oral  SpO2: 100% 100% 99% 98%  Weight:   99.9 kg   Height:        Intake/Output Summary (Last 24 hours) at 10/05/2020 0751 Last data filed at 10/05/2020 0500 Gross per 24 hour  Intake 3003.35 ml  Output 350 ml  Net 2653.35 ml   Last 3 Weights 10/05/2020 10/04/2020 03/04/2020  Weight (lbs) 220 lb 4.8 oz 220 lb 217 lb  Weight (kg) 99.927 kg 99.791 kg 98.431 kg     Telemetry    NSR with LBBB - Personally Reviewed  Physical Exam   GEN: No acute distress, obese.  HEENT: Normocephalic, atraumatic, sclera non-icteric. Neck: No JVD or bruits. Cardiac: RRR no murmurs, rubs, or gallops.  Radials/DP/PT 1+ and equal bilaterally.  Respiratory: Clear to auscultation bilaterally. Breathing is unlabored. GI: Soft, nontender, non-distended, BS +x 4. MS: no deformity. Extremities: No clubbing or cyanosis.  No edema. Distal pedal pulses are 2+ and equal bilaterally. Neuro:  AAOx3. Follows commands. Psych:  Responds to questions appropriately with a normal affect.  Labs    High Sensitivity Troponin:   Recent Labs  Lab 10/04/20 1555 10/04/20 1842  TROPONINIHS 11 11      Cardiac EnzymesNo results for input(s): TROPONINI in the last 168 hours. No results for input(s): TROPIPOC in the last 168 hours.   Chemistry Recent Labs  Lab 10/04/20 1555 10/05/20 0228  NA 137 139  K 3.6 3.5  CL 98 99  CO2 28 28  GLUCOSE 288* 125*  BUN 25* 25*  CREATININE 1.26* 1.16*  CALCIUM 9.2 9.0  GFRNONAA 48* 53*  ANIONGAP 11 12     Hematology Recent Labs  Lab 10/04/20 1555  WBC 5.8  RBC 3.48*  HGB 11.8*  HCT 34.8*  MCV 100.0  MCH 33.9  MCHC 33.9  RDW 12.9  PLT 164    BNP Recent Labs  Lab 10/04/20 1555  BNP 32.9     DDimer  Recent Labs  Lab 10/04/20 1555  DDIMER 0.30     Radiology    DG Chest Portable 1 View  Result Date: 10/04/2020 CLINICAL DATA:  Chest pain, dyspnea. EXAM: PORTABLE CHEST 1 VIEW COMPARISON:  06/13/2019 and prior. FINDINGS:  Mild hypoinflation. Linear right basilar opacity, likely atelectasis versus superimposition artifact. No pneumothorax or pleural effusion. Cardiomediastinal silhouette is within normal limits. Multilevel spondylosis. Partially imaged spinal fusion hardware. IMPRESSION: No focal airspace disease. Electronically Signed   By: Primitivo Gauze M.D.   On: 10/04/2020 16:12    Cardiac Studies   2D Echo 04/2019 1. The left ventricle has normal systolic function with an ejection  fraction of 60-65%. The cavity size was normal. There is mildly increased  left ventricular wall thickness. Left ventricular diastolic Doppler  parameters are consistent with impaired  relaxation.  2. The right ventricle has normal systolic function. The cavity was  normal. There is no increase in right ventricular wall thickness.  3. The aortic root is normal  in size and structure.   Nuc 04/2019  The left ventricular ejection fraction is normal (55-65%).  Nuclear stress EF: 56%.  There was no ST segment deviation noted during stress.  The study is normal.  This is a low risk study   Patient Profile     64 y.o. female with history of mild CAD by cath 2015 (50% mid LCx at [facility]), hypertensive heart disease, CKD stage IIIa, arthritis, anemia, anxiety, depression, HTN, HLD, lumbar stenosis, OSA (uses [place]/intolerant of CPAP), DM, morbid obesity, chronic DOE who presented to [facility] with worsening DOE and chest/back discomfort.  Assessment & Plan    1. Chest pain with acute on chronic dyspnea with history of mild CAD by cath 2014 - question if this represents progressive angina - EKG shows chronic LBBB, troponins negative x2, and BNP wnl - Covid swab negative - given severity of symptoms and cardiac risk factors with no obvious etiology otherwise, would suggest proceeding with [facility] today - Risks and benefits of cardiac catheterization have been discussed with the patient.  These include bleeding, infection, kidney damage, stroke, heart attack, death.  The patient understands these risks and is willing to proceed. She is eager to get a definitive answer for symptoms - given hypertensive heart disease/chronic diuretic use, will hold torsemide and adjust for more gentle pre-cath hydration rather than standard high dose fluids  2. CKD stage IIIa - stable by labs - follow post-cath  3. History of anemia with mild decrease in Hgb compared to prior although similar to previous years - Hgb baseline 10-11 in prior years, 13.2 in 06/2019 (seems to be outlier) - 11.8 on admission - continue to monitor, no bleeding reported - f/u CBC this AM and tomorrow  4. OSA with morbid obesity, intolerant to CPAP/BIPAP - lifestyle modification will be important as OP - needs to also f/u Dr. Claiborne Billings (overdue) for additional options  5. Hypertensive heart  disease - BNP wnl, will hold home torsemide  - follow as above  6. IDDM - A1C 9.5 - DM coordinator consulted - close OP f/u recommended - SSI while pending studies  7. Hyperlipidemia - given mild CAD and DM, would suggest initiation of statin. Prior myalgias, nausea, vomiting noted with atorvastatin so will trial Crestor 10mg  daily to start (patient expresses concern due to other family members having statin issues in the past as well) - If the patient is tolerating statin at time of follow-up appointment, would consider rechecking liver function/lipid panel in 6-8 weeks  For questions or updates, please contact [facility] Please consult [email or web address] for contact info under Cardiology/STEMI.  Signed, Charlie Pitter, PA-C 10/05/2020, 7:51 AM    History and all data above reviewed.  Patient examined.  I  agree with the findings as above. No arm pain but with continues SOB and back pain.  No objective evidence of ischemia.   The patient exam reveals COR:RRR  ,  Lungs: Clear  ,  Abd: Positive bowel sounds, no rebound no guarding, Ext No edema  .  All available labs, radiology testing, previous records reviewed. Agree with documented assessment and plan.   Chest pain:  She has significant risk factors as above and moderate CAD in the distant past.  Right and left heart cath is indicated.  The patient understands that risks included but are not limited to stroke (1 in 1000), death (1 in 58), kidney failure [usually temporary] (1 in 500), bleeding (1 in 200), allergic reaction [possibly serious] (1 in 200).  The patient understands and agrees to proceed.     Jeneen Rinks [facility]  9:21 AM  10/05/2020

## 2020-10-05 NOTE — Interval H&P Note (Signed)
History and Physical Interval Note:  10/05/2020 2:27 PM  Katelyn [name]  has presented today for surgery, with the diagnosis of chest pain.  The various methods of treatment have been discussed with the patient and family. After consideration of risks, benefits and other options for treatment, the patient has consented to  Procedure(s): RIGHT/LEFT HEART CATH AND CORONARY ANGIOGRAPHY (N/A) as a surgical intervention.  The patient's history has been reviewed, patient examined, no change in status, stable for surgery.  I have reviewed the patient's chart and labs.  Questions were answered to the patient's satisfaction.   Cath Lab Visit (complete for each Cath Lab visit)  Clinical Evaluation Leading to the Procedure:   ACS: Yes.    Non-ACS:    Anginal Classification: CCS III  Anti-ischemic medical therapy: Minimal Therapy (1 class of medications)  Non-Invasive Test Results: No non-invasive testing performed  Prior CABG: No previous CABG        Theron Arista [facility] 10/05/2020 2:27 PM

## 2020-10-05 NOTE — H&P (View-Only) (Signed)
Progress Note  Patient Name: Katelyn [name] Date of Encounter: 10/05/2020  Primary Cardiologist: Norman Herrlich, MD  Subjective   Reports continued pain in back of neck, sporadically in back, not so much chest this morning. Feels more SOB in general while talking. Very fatigued. Worried about blockage.  Inpatient Medications    Scheduled Meds: . allopurinol  100 mg Oral QHS  . aspirin EC  81 mg Oral Daily  . carvedilol  6.25 mg Oral BID  . DULoxetine  60 mg Oral QHS  . enoxaparin (LOVENOX) injection  40 mg Subcutaneous Q24H  . insulin aspart  0-15 Units Subcutaneous TID WC  . insulin aspart  0-5 Units Subcutaneous QHS  . mirtazapine  15 mg Oral QHS  . pantoprazole  40 mg Oral Daily  . pramipexole  1 mg Oral QHS  . pregabalin  50 mg Oral QHS   Continuous Infusions:  PRN Meds: acetaminophen, nitroGLYCERIN, ondansetron (ZOFRAN) IV   Vital Signs    Vitals:   10/04/20 1930 10/04/20 2325 10/05/20 0201 10/05/20 0538  BP: (!) 131/52 (!) 106/48 127/66 124/61  Pulse: 72 71 69 66  Resp: 14 15 16    Temp:  97.8 F (36.6 C) 97.9 F (36.6 C) 97.9 F (36.6 C)  TempSrc:   Oral Oral  SpO2: 100% 100% 99% 98%  Weight:   99.9 kg   Height:        Intake/Output Summary (Last 24 hours) at 10/05/2020 0751 Last data filed at 10/05/2020 0500 Gross per 24 hour  Intake 3003.35 ml  Output 350 ml  Net 2653.35 ml   Last 3 Weights 10/05/2020 10/04/2020 03/04/2020  Weight (lbs) 220 lb 4.8 oz 220 lb 217 lb  Weight (kg) 99.927 kg 99.791 kg 98.431 kg     Telemetry    NSR with LBBB - Personally Reviewed  Physical Exam   GEN: No acute distress, obese.  HEENT: Normocephalic, atraumatic, sclera non-icteric. Neck: No JVD or bruits. Cardiac: RRR no murmurs, rubs, or gallops.  Radials/DP/PT 1+ and equal bilaterally.  Respiratory: Clear to auscultation bilaterally. Breathing is unlabored. GI: Soft, nontender, non-distended, BS +x 4. MS: no deformity. Extremities: No clubbing or cyanosis.  No edema. Distal pedal pulses are 2+ and equal bilaterally. Neuro:  AAOx3. Follows commands. Psych:  Responds to questions appropriately with a normal affect.  Labs    High Sensitivity Troponin:   Recent Labs  Lab 10/04/20 1555 10/04/20 1842  TROPONINIHS 11 11      Cardiac EnzymesNo results for input(s): TROPONINI in the last 168 hours. No results for input(s): TROPIPOC in the last 168 hours.   Chemistry Recent Labs  Lab 10/04/20 1555 10/05/20 0228  NA 137 139  K 3.6 3.5  CL 98 99  CO2 28 28  GLUCOSE 288* 125*  BUN 25* 25*  CREATININE 1.26* 1.16*  CALCIUM 9.2 9.0  GFRNONAA 48* 53*  ANIONGAP 11 12     Hematology Recent Labs  Lab 10/04/20 1555  WBC 5.8  RBC 3.48*  HGB 11.8*  HCT 34.8*  MCV 100.0  MCH 33.9  MCHC 33.9  RDW 12.9  PLT 164    BNP Recent Labs  Lab 10/04/20 1555  BNP 32.9     DDimer  Recent Labs  Lab 10/04/20 1555  DDIMER 0.30     Radiology    DG Chest Portable 1 View  Result Date: 10/04/2020 CLINICAL DATA:  Chest pain, dyspnea. EXAM: PORTABLE CHEST 1 VIEW COMPARISON:  06/13/2019 and prior. FINDINGS:  Mild hypoinflation. Linear right basilar opacity, likely atelectasis versus superimposition artifact. No pneumothorax or pleural effusion. Cardiomediastinal silhouette is within normal limits. Multilevel spondylosis. Partially imaged spinal fusion hardware. IMPRESSION: No focal airspace disease. Electronically Signed   By: Primitivo Gauze M.D.   On: 10/04/2020 16:12    Cardiac Studies   2D Echo 04/2019 1. The left ventricle has normal systolic function with an ejection  fraction of 60-65%. The cavity size was normal. There is mildly increased  left ventricular wall thickness. Left ventricular diastolic Doppler  parameters are consistent with impaired  relaxation.  2. The right ventricle has normal systolic function. The cavity was  normal. There is no increase in right ventricular wall thickness.  3. The aortic root is normal  in size and structure.   Nuc 04/2019  The left ventricular ejection fraction is normal (55-65%).  Nuclear stress EF: 56%.  There was no ST segment deviation noted during stress.  The study is normal.  This is a low risk study   Patient Profile     65 y.o. female with history of mild CAD by cath 2015 (50% mid LCx at [facility]), hypertensive heart disease, CKD stage IIIa, arthritis, anemia, anxiety, depression, HTN, HLD, lumbar stenosis, OSA (uses [place]/intolerant of CPAP), DM, morbid obesity, chronic DOE who presented to [facility] with worsening DOE and chest/back discomfort.  Assessment & Plan    1. Chest pain with acute on chronic dyspnea with history of mild CAD by cath 2014 - question if this represents progressive angina - EKG shows chronic LBBB, troponins negative x2, and BNP wnl - Covid swab negative - given severity of symptoms and cardiac risk factors with no obvious etiology otherwise, would suggest proceeding with [facility] today - Risks and benefits of cardiac catheterization have been discussed with the patient.  These include bleeding, infection, kidney damage, stroke, heart attack, death.  The patient understands these risks and is willing to proceed. She is eager to get a definitive answer for symptoms - given hypertensive heart disease/chronic diuretic use, will hold torsemide and adjust for more gentle pre-cath hydration rather than standard high dose fluids  2. CKD stage IIIa - stable by labs - follow post-cath  3. History of anemia with mild decrease in Hgb compared to prior although similar to previous years - Hgb baseline 10-11 in prior years, 13.2 in 06/2019 (seems to be outlier) - 11.8 on admission - continue to monitor, no bleeding reported - f/u CBC this AM and tomorrow  4. OSA with morbid obesity, intolerant to CPAP/BIPAP - lifestyle modification will be important as OP - needs to also f/u Dr. Claiborne Billings (overdue) for additional options  5. Hypertensive heart  disease - BNP wnl, will hold home torsemide  - follow as above  6. IDDM - A1C 9.5 - DM coordinator consulted - close OP f/u recommended - SSI while pending studies  7. Hyperlipidemia - given mild CAD and DM, would suggest initiation of statin. Prior myalgias, nausea, vomiting noted with atorvastatin so will trial Crestor 10mg  daily to start (patient expresses concern due to other family members having statin issues in the past as well) - If the patient is tolerating statin at time of follow-up appointment, would consider rechecking liver function/lipid panel in 6-8 weeks  For questions or updates, please contact [facility] Please consult [email or web address] for contact info under Cardiology/STEMI.  Signed, Charlie Pitter, PA-C 10/05/2020, 7:51 AM    History and all data above reviewed.  Patient examined.  I  agree with the findings as above. No arm pain but with continues SOB and back pain.  No objective evidence of ischemia.   The patient exam reveals COR:RRR  ,  Lungs: Clear  ,  Abd: Positive bowel sounds, no rebound no guarding, Ext No edema  .  All available labs, radiology testing, previous records reviewed. Agree with documented assessment and plan.   Chest pain:  She has significant risk factors as above and moderate CAD in the distant past.  Right and left heart cath is indicated.  The patient understands that risks included but are not limited to stroke (1 in 1000), death (1 in 1), kidney failure [usually temporary] (1 in 500), bleeding (1 in 200), allergic reaction [possibly serious] (1 in 200).  The patient understands and agrees to proceed.     [facility]  9:21 AM  10/05/2020

## 2020-10-05 NOTE — Progress Notes (Signed)
Patient arrived on the unit from ED assessment completed see flow sheet, placed on tele ccmd notified, patient oriented to room and staff, bed in lowest position call bell within reach will continue to monitor.

## 2020-10-06 ENCOUNTER — Encounter (HOSPITAL_COMMUNITY): Payer: Self-pay | Admitting: Cardiology

## 2020-10-06 ENCOUNTER — Telehealth: Payer: Self-pay | Admitting: Pulmonary Disease

## 2020-10-06 ENCOUNTER — Inpatient Hospital Stay (HOSPITAL_COMMUNITY): Payer: PPO

## 2020-10-06 DIAGNOSIS — J9611 Chronic respiratory failure with hypoxia: Secondary | ICD-10-CM

## 2020-10-06 DIAGNOSIS — E662 Morbid (severe) obesity with alveolar hypoventilation: Secondary | ICD-10-CM

## 2020-10-06 DIAGNOSIS — J9612 Chronic respiratory failure with hypercapnia: Secondary | ICD-10-CM

## 2020-10-06 DIAGNOSIS — R0600 Dyspnea, unspecified: Secondary | ICD-10-CM | POA: Diagnosis not present

## 2020-10-06 DIAGNOSIS — I272 Pulmonary hypertension, unspecified: Secondary | ICD-10-CM

## 2020-10-06 DIAGNOSIS — R072 Precordial pain: Secondary | ICD-10-CM | POA: Diagnosis not present

## 2020-10-06 DIAGNOSIS — G4733 Obstructive sleep apnea (adult) (pediatric): Secondary | ICD-10-CM | POA: Diagnosis not present

## 2020-10-06 HISTORY — DX: Pulmonary hypertension, unspecified: I27.20

## 2020-10-06 LAB — GLUCOSE, CAPILLARY
Glucose-Capillary: 300 mg/dL — ABNORMAL HIGH (ref 70–99)
Glucose-Capillary: 285 mg/dL — ABNORMAL HIGH (ref 70–99)
Glucose-Capillary: 318 mg/dL — ABNORMAL HIGH (ref 70–99)
Glucose-Capillary: 287 mg/dL — ABNORMAL HIGH (ref 70–99)

## 2020-10-06 LAB — BASIC METABOLIC PANEL
Anion gap: 10 (ref 5–15)
BUN: 18 mg/dL (ref 8–23)
CO2: 26 mmol/L (ref 22–32)
Calcium: 8.4 mg/dL — ABNORMAL LOW (ref 8.9–10.3)
Chloride: 99 mmol/L (ref 98–111)
Creatinine, Ser: 1.13 mg/dL — ABNORMAL HIGH (ref 0.44–1.00)
GFR, Estimated: 55 mL/min — ABNORMAL LOW (ref >60)
Glucose, Bld: 280 mg/dL — ABNORMAL HIGH (ref 70–99)
Potassium: 3.6 mmol/L (ref 3.5–5.1)
Sodium: 135 mmol/L (ref 135–145)

## 2020-10-06 LAB — CBC
HCT: 34.7 % — ABNORMAL LOW (ref 36.0–46.0)
Hemoglobin: 11.5 g/dL — ABNORMAL LOW (ref 12.0–15.0)
MCH: 33.6 pg (ref 26.0–34.0)
MCHC: 33.1 g/dL (ref 30.0–36.0)
MCV: 101.5 fL — ABNORMAL HIGH (ref 80.0–100.0)
Platelets: 144 10*3/uL — ABNORMAL LOW (ref 150–400)
RBC: 3.42 MIL/uL — ABNORMAL LOW (ref 3.87–5.11)
RDW: 13.1 % (ref 11.5–15.5)
WBC: 4.9 10*3/uL (ref 4.0–10.5)
nRBC: 0 % (ref 0.0–0.2)

## 2020-10-06 LAB — ECHOCARDIOGRAM COMPLETE
Area-P 1/2: 4.68 cm2
Calc EF: 61.2 %
Height: 62 in
S' Lateral: 3.2 cm
Single Plane A2C EF: 63.9 %
Single Plane A4C EF: 59.2 %
Weight: 3558.4 oz

## 2020-10-06 LAB — HIV ANTIBODY (ROUTINE TESTING W REFLEX): HIV Screen 4th Generation wRfx: NONREACTIVE

## 2020-10-06 MED ORDER — INSULIN ASPART 100 UNIT/ML ~~LOC~~ SOLN
4.0000 [IU] | Freq: Three times a day (TID) | SUBCUTANEOUS | Status: DC
  Administered 2020-10-06 – 2020-10-07 (×3): 4 [IU] via SUBCUTANEOUS

## 2020-10-06 MED ORDER — TECHNETIUM TO 99M ALBUMIN AGGREGATED
4.4000 | Freq: Once | INTRAVENOUS | Status: AC | PRN
  Administered 2020-10-06: 11:00:00 4.4 via INTRAVENOUS

## 2020-10-06 MED ORDER — INSULIN GLARGINE 100 UNIT/ML ~~LOC~~ SOLN
20.0000 [IU] | Freq: Every day | SUBCUTANEOUS | Status: DC
  Administered 2020-10-06 – 2020-10-07 (×2): 20 [IU] via SUBCUTANEOUS
  Filled 2020-10-06 (×3): qty 0.2

## 2020-10-06 NOTE — Progress Notes (Signed)
SATURATION QUALIFICATIONS: (This note is used to comply with regulatory documentation for home oxygen)  Patient Saturations on Room Air at Rest = 97%  Patient Saturations on Room Air while Ambulating = 86%  Patient Saturations on 2 Liters of oxygen while Ambulating = 94%  Please briefly explain why patient needs home oxygen: Patients oxygen saturations drops to 86% while ambulating.

## 2020-10-06 NOTE — TOC Transition Note (Signed)
Transition of Care [facility]) - CM/SW Discharge Note   Patient Details  Name: Katelyn [name] MRN: [id number] Date of Birth: 1955/10/14  [facility]) CM/SW Contact:  Lawerance Sabal, RN Phone Number: 10/06/2020, 5:14 PM   Clinical Narrative:    Home oxygen set up with [employer], it wil be delivered to the room prior to DC. Patient states that her spouse is available to transport her home. HH RN set up with [employer].  Patient aware of the above. No other CM needs identified.     Final next level of care: Home w Home Health Services Barriers to Discharge: No Barriers Identified   Patient Goals and CMS Choice Patient states their goals for this hospitalization and ongoing recovery are:: to go home CMS Medicare.gov Compare Post Acute Care list provided to:: Patient Choice offered to / list presented to : Patient  Discharge Placement                       Discharge Plan and Services                DME Arranged: Oxygen DME Agency: [employer] Date DME Agency Contacted: 10/06/20 Time DME Agency Contacted: [phone] Representative spoke with at DME Agency: Maud Deed HH Arranged: RN HH Agency: [facility] Date [facility] Agency Contacted: 10/06/20 Time HH Agency Contacted: [phone] Representative spoke with at [facility] Agency: Kandee Keen  Social Determinants of Health (SDOH) Interventions     Readmission Risk Interventions No flowsheet data found.

## 2020-10-06 NOTE — Progress Notes (Signed)
  Echocardiogram 2D Echocardiogram with 3D has been performed.  Leta Jungling M 10/06/2020, 2:25 PM

## 2020-10-06 NOTE — Telephone Encounter (Signed)
Please arrange follow up for Katelyn [name] with Dr. Craige Cotta for sleep at first available appt and call her with appt.  Thank you very much!   Canary Brim, MSN, NP-C, AGACNP-BC [facility] 10/06/2020, 3:16 PM   Please see [email or web address] for pager details.

## 2020-10-06 NOTE — Telephone Encounter (Signed)
Left message on phone next available with Dr. Craige Cotta is 11/30/20

## 2020-10-06 NOTE — Progress Notes (Addendum)
Progress Note  Patient Name: Katelyn [name] Date of Encounter: 10/06/2020  [facility] Cardiologist: Norman Herrlich, MD   Subjective   Still some pain in her back.  Also in Lt neck.  She has trouble swallowing food, not liquids and has had esophageal dilatation in past - Dr. Chales Abrahams from [place].  ([facility])  Inpatient Medications    Scheduled Meds: . allopurinol  100 mg Oral QHS  . aspirin EC  81 mg Oral Daily  . carvedilol  6.25 mg Oral BID  . DULoxetine  60 mg Oral QHS  . enoxaparin (LOVENOX) injection  40 mg Subcutaneous Q24H  . insulin aspart  0-15 Units Subcutaneous TID WC  . insulin aspart  0-5 Units Subcutaneous QHS  . mirtazapine  15 mg Oral QHS  . pantoprazole  40 mg Oral Daily  . pramipexole  1 mg Oral QHS  . pregabalin  50 mg Oral QHS  . rosuvastatin  10 mg Oral Daily  . sodium chloride flush  3 mL Intravenous Q12H  . sodium chloride flush  3 mL Intravenous Q12H   Continuous Infusions: . sodium chloride     PRN Meds: sodium chloride, acetaminophen, nitroGLYCERIN, ondansetron (ZOFRAN) IV, sodium chloride flush   Vital Signs    Vitals:   10/05/20 1948 10/05/20 2300 10/06/20 0406 10/06/20 0742  BP: (!) 118/54 (!) 148/61 (!) 136/55   Pulse: 73 70 74   Resp: 16 16 16  (P) 18  Temp: 98.3 F (36.8 C) 98 F (36.7 C) 97.8 F (36.6 C) (P) 97.9 F (36.6 C)  TempSrc: Oral Oral Oral (P) Oral  SpO2: 92% 92% 99%   Weight:   100.9 kg   Height:        Intake/Output Summary (Last 24 hours) at 10/06/2020 0829 Last data filed at 10/06/2020 0125 Gross per 24 hour  Intake 337 ml  Output 900 ml  Net -563 ml   Last 3 Weights 10/06/2020 10/05/2020 10/04/2020  Weight (lbs) 222 lb 6.4 oz 220 lb 4.8 oz 220 lb  Weight (kg) 100.88 kg 99.927 kg 99.791 kg      Telemetry    SR - Personally Reviewed  ECG    No new - Personally Reviewed  Physical Exam   GEN: No acute distress.   Neck: No JVD Cardiac: RRR, no murmurs, rubs, or gallops.  Rt wrist cath site without hematoma Respiratory: Clear to auscultation bilaterally. GI: Soft, nontender, non-distended  MS: No edema; No deformity. Neuro:  Nonfocal  Psych: Normal affect   Labs    High Sensitivity Troponin:   Recent Labs  Lab 10/04/20 1555 10/04/20 1842  TROPONINIHS 11 11      Chemistry Recent Labs  Lab 10/04/20 1555 10/05/20 0228 10/05/20 0904 10/05/20 1456 10/05/20 1504 10/06/20 0105  NA 137 139  --  141 140 135  K 3.6 3.5  --  3.8 3.8 3.6  CL 98 99  --   --   --  99  CO2 28 28  --   --   --  26  GLUCOSE 288* 125*  --   --   --  280*  BUN 25* 25*  --   --   --  18  CREATININE 1.26* 1.16* 1.11*  --   --  1.13*  CALCIUM 9.2 9.0  --   --   --  8.4*  GFRNONAA 48* 53* 56*  --   --  55*  ANIONGAP 11 12  --   --   --  10     Hematology Recent Labs  Lab 10/04/20 1555 10/05/20 0904 10/05/20 1456 10/05/20 1504 10/06/20 0105  WBC 5.8 5.6  --   --  4.9  RBC 3.48* 3.39*  --   --  3.42*  HGB 11.8* 11.8* 11.9* 11.9* 11.5*  HCT 34.8* 34.1* 35.0* 35.0* 34.7*  MCV 100.0 100.6*  --   --  101.5*  MCH 33.9 34.8*  --   --  33.6  MCHC 33.9 34.6  --   --  33.1  RDW 12.9 13.2  --   --  13.1  PLT 164 164  --   --  144*    BNP Recent Labs  Lab 10/04/20 1555  BNP 32.9     DDimer  Recent Labs  Lab 10/04/20 1555  DDIMER 0.30     Radiology    CARDIAC CATHETERIZATION  Result Date: 10/06/2020  Prox LAD lesion is 30% stenosed.  1st Diag lesion is 55% stenosed.  Dist Cx lesion is 40% stenosed.  Prox RCA lesion is 25% stenosed.  Dist RCA lesion is 25% stenosed.  There is mild left ventricular systolic dysfunction.  LV end diastolic pressure is moderately elevated.  The left ventricular ejection fraction is 50-55% by visual estimate.  Hemodynamic findings consistent with mild pulmonary hypertension.  1. Nonobstructive CAD. Codominant circulation 2. Low normal LV systolic function. EF estimated at 50%. 3. Elevated LV filling pressures - PCWP 22 mm Hg,  EDP 25 mm Hg 4. Mild pulmonary HTN with mean PAP 27 mm Hg 5. Elevated RA pressures 16 mm Hg 6. Reduced cardiac output with index 2.5 Plan: medical management for CAD. Need to repeat Echo to assess LV and RV function. May need to consider pulmonary embolic disease given pulmonary HTN and elevated RA pressures. This may reflect elevated Left heart pressures. Morbid obesity and OSA may also be contributing.   DG Chest Portable 1 View  Result Date: 10/04/2020 CLINICAL DATA:  Chest pain, dyspnea. EXAM: PORTABLE CHEST 1 VIEW COMPARISON:  06/13/2019 and prior. FINDINGS: Mild hypoinflation. Linear right basilar opacity, likely atelectasis versus superimposition artifact. No pneumothorax or pleural effusion. Cardiomediastinal silhouette is within normal limits. Multilevel spondylosis. Partially imaged spinal fusion hardware. IMPRESSION: No focal airspace disease. Electronically Signed   By: Primitivo Gauze M.D.   On: 10/04/2020 16:12    Cardiac Studies  Cardiac cath 10/05/20    Prox LAD lesion is 30% stenosed.  1st Diag lesion is 55% stenosed.  Dist Cx lesion is 40% stenosed.  Prox RCA lesion is 25% stenosed.  Dist RCA lesion is 25% stenosed.  There is mild left ventricular systolic dysfunction.  LV end diastolic pressure is moderately elevated.  The left ventricular ejection fraction is 50-55% by visual estimate.  Hemodynamic findings consistent with mild pulmonary hypertension.     1. Nonobstructive CAD. Codominant circulation 2. Low normal LV systolic function. EF estimated at 50%. 3. Elevated LV filling pressures - PCWP 22 mm Hg, EDP 25 mm Hg 4. Mild pulmonary HTN with mean PAP 27 mm Hg 5. Elevated RA pressures 16 mm Hg 6. Reduced cardiac output with index 2.5  Plan: medical management for CAD. Need to repeat Echo to assess LV and RV function. May need to consider pulmonary embolic disease given pulmonary HTN and elevated RA pressures. This may reflect elevated Left heart  pressures. Morbid obesity and OSA may also be contributing.  Flowsheet Row Most Recent Value  Fick Cardiac Output 5 L/min  Fick Cardiac Output Index 2.51 (  L/min)/BSA  RA A Wave 24 mmHg  RA V Wave 17 mmHg  RA Mean 16 mmHg  RV Systolic Pressure 47 mmHg  RV Diastolic Pressure 9 mmHg  RV EDP 20 mmHg  PA Systolic Pressure 42 mmHg  PA Diastolic Pressure 19 mmHg  PA Mean 27 mmHg  PW A Wave 21 mmHg  PW V Wave 23 mmHg  PW Mean 22 mmHg  AO Systolic Pressure A999333 mmHg  AO Diastolic Pressure 71 mmHg  AO Mean 97 mmHg  LV Systolic Pressure A999333 mmHg  LV Diastolic Pressure 16 mmHg  LV EDP 25 mmHg  AOp Systolic Pressure A999333 mmHg  AOp Diastolic Pressure 67 mmHg  AOp Mean Pressure 97 mmHg  LVp Systolic Pressure 123456 mmHg  LVp Diastolic Pressure 23 mmHg  LVp EDP Pressure 29 mmHg  QP/QS 1  TPVR Index 10.76 HRUI  TSVR Index 38.65 HRUI  PVR SVR Ratio 0.06  TPVR/TSVR Ratio 0.28   ECHO today pending   2D Echo 04/2019 1. The left ventricle has normal systolic function with an ejection  fraction of 60-65%. The cavity size was normal. There is mildly increased  left ventricular wall thickness. Left ventricular diastolic Doppler  parameters are consistent with impaired  relaxation.  2. The right ventricle has normal systolic function. The cavity was  normal. There is no increase in right ventricular wall thickness.  3. The aortic root is normal in size and structure.  Nuc 04/2019  The left ventricular ejection fraction is normal (55-65%).  Nuclear stress EF: 56%.  There was no ST segment deviation noted during stress.  The study is normal.  This is a low risk study   Patient Profile     64 y.o. female with history of mild CAD by cath 2015 (50% mid LCx at [facility]), hypertensive heart disease, CKD stage IIIa, arthritis, anemia, anxiety, depression, HTN, HLD, lumbar stenosis, OSA(uses [place]/intolerant of CPAP), DM, morbid obesity, chronic DOE who presented to [facility] with worsening DOE  and chest/back discomfort.  Assessment & Plan    1. Chest painwithacute on chronic dyspnea with history of mild CAD by cath 2014 - question if this represents progressive angina - EKG shows chronic LBBB, troponins negative x2, and BNP wnl - Covid swab negative - - given hypertensive heart disease/chronic diuretic use, cath was done yesterday. --Held torsemide --non obstructive CAD with 1st diag of 55%, dist LCX 40% and rest 25 - [id number]  Mild LV systolic dysfunction with EF 50-55%  Mild pulmonary HTN. For echo today.  PCWP 22 mm Hg, EDP 25 mm Hg mean PAP 27 mm Hg  Elevated RA pressures 16 mm Hg --reduced CO with CI of 2.5  --VQ ordered for today with neg DDimer on recommendations of Dr. Martinique to be complete with mild pulmonary HTN and elevated Rt pressures.   2. CKD stage IIIa - stable by labs - follow post-cath and today 1.13 from 1.11   3. History of anemiawith mild decrease in Hgb compared toprioralthough similar to previous years - Hgb baseline 10-11 in prior years, 13.2 in 06/2019 (seems to be outlier) - 11.8 on admission and today 11.5  - continue to monitor, no bleeding reported - f/u CBC tomorrow  4. OSA with morbid obesity, intolerant to CPAP/BIPAP - lifestyle modification will be important as OP -during the night sp02 down to 83%  ? home oxygen since she cannot tolerate Bipap mask?   Will eval sp02 with ambualtion.  - needs to also f/u Dr. Claiborne Billings (overdue)  for additional options  5. Hypertensive heart disease - BNP wnl, holding home torsemide ? Resume today defer to Dr. Percival Spanish - follow as above  6.IDDM - A1C 9.5 - DM coordinator consulted "Recommended that patient use sensor when she gets home so that she can monitor closely for both high and low blood sugars.  She verbalized understanding and also states she will f/u with Dr. Forde Dandy."    While in the hospital, consider adding Lantus 20 units daily.  Once eating, consider also adding Novolog meal coverage 4 units tid  with meals.  - close OP f/u recommended - SSI with stable glucose until last PM 278 and 280 at 0100 will add meal coverage as recommended.  7. Hyperlipidemia - given mild CAD and DM, would suggest initiation of statin. Prior myalgias, nausea, vomiting noted with atorvastatin so will trial Crestor 10mg  daily to start (patient expresses concern due to other family members having statin issues in the past as well) - If the patient is tolerating statin at time of follow-up appointment, would consider rechecking liver function/lipid panel in 6-8 weeks  8.  Esophageal stricture now with difficult swallowing food, prob send back to GI for eval.     For questions or updates, please contact [facility] Please consult [email or web address] for contact info under        Signed, Cecilie Kicks, NP  10/06/2020, 8:29 AM    History and all data above reviewed.  Patient examined.  I agree with the findings as above.    She has multiple complaints.  She is having desaturations at night.  She has leg weakness.  She has difficulty swallowing.   The patient exam reveals COR:RRR  ,  Lungs:   Clear  ,  Abd: Positive bowel sounds, no rebound no guarding, Ext No edema, right radial without bleeding or bruising  .  All available labs, radiology testing, previous records reviewed. Agree with documented assessment and plan. DOE:  We will check O2 sats with ambulation.  She might need home O2 if she desats while awake.    We will check an echo today and VQ to rule out chronic thromboembolism.  Ultimately , I suspect her DOE is related to weight and deconditioning. Obesity:  I will make an out patient referral to [facility].  Sleep apnea:  She has tried CPAP.  I will make an out patient referral to ENT to talk about other therapies.  Ultimately, I think the better option is CPAP and weight loss and she and I discussed this today.  Diastolic dysfunction:  Check echo today.  Discussed diabetes management as a  component of reducing progressive diastolic dysfunction.  DM:  I will refer her back to Dr. Forde Dandy.  This needs to be controlled and she might benefit from GLP1 ra.    Possibly home today if no acute findings on VQ or echo.   Leg weakness:  I would start by sending her back to her neurosurgeon.  However, I would also have her discuss with her PCP neurology work up to look for some unifying diagnosis given the difficulty swallowing, DOE, leg weakness.    [facility]  9:42 AM  10/06/2020

## 2020-10-06 NOTE — Consult Note (Addendum)
NAME:  Katelyn [name], MRN:  [id number], DOB:  [birth year], LOS: 1 ADMISSION DATE:  10/04/2020, CONSULTATION DATE:  10/06/20 REFERRING MD:  Dr. Percival [name], CHIEF COMPLAINT:  Dyspnea   Brief History:  64 y/o F admitted 12/27 with shortness of breath and chest pain.   History of Present Illness:  64 y/o F who presented to [facility] 12/27 with chest pain and shortness of breath.    The patient previously has had a sleep study and recommended for BiPAP 23/19. She has been seen by Dr. [name] in the past for dyspnea. She initially was prescribed a full face mask but she had periods of waking with panic and she had to change to nasal pillows.  She could not sleep with nasal pillows or mask and has not been using the BiPAP for 3 months.  She presented to [facility] on 12/27 with chest pain and shortness of breath, pain that extended into the left arm and in the back.  She reports she has chronic cough with mucus production, especially in the am.  She feels as if her nose is clogged and has a difficult time getting secretions up.  She has tried nasal lavage and it "helps some".  Pt sleeps in a recliner at home.  She denies fever, chills, n/v/d.   She was admitted for work up of chest pain.  The patient underwent LHC which demonstrated nonobstructive CAD, codominant circulation, low normal EF 50%, elevated LV filling pressures with PCWP 22, EDP 25, mild pulmonary HTN with PAP 27, elevated RA pressure 16, reduced cardiac output with index of 2.5.  CXR demonstrated mild vascular congestion but no infiltrate / overt edema.  Given elevation of pulmonary pressures, a VQ scan was completed to rule out chronic thromboembolic disease.  There was no evidence of perfusion mismatch.  She has had prior VQ scans as well without mismatch / PE in the past.  She was ambulated and desaturated to 86% with exertion.    PCCM consulted for evaluation of dyspnea, sleep disordered breathing and hypoxia with exertion.    Past Medical History:  OSA  - on BiPAP Dyspnea - baseline, hx of possible restrictive disease due to chronic back issues  Chronic Cough BMI 40  DM II  RLS IDA  HTN  HLD CAD  CKD III  Depression - loss of son when he was 72 years old in December.   Anxiety   Significant Hospital Events:  12/27 Admit with chest pain, SOB  Consults:    Procedures:    Significant Diagnostic Tests:   LHC 12/28 >> nonobstructive CAD, codominant circulation, low normal EF 50%, elevated LV filling pressures with PCWP 22, EDP 25, mild pulmonary HTN with PAP 27, elevated RA pressure 16, reduced cardiac output with index of 2.5  VQ Scan 12/29 >> negative for mismatch / PE  Micro Data:  COVID 12/27 >> negative  Influenza 12/27 >> negative  Antimicrobials:    Interim History / Subjective:  As above Afebrile  On RA with sats 96-99% at rest   Objective   Blood pressure (!) 145/73, pulse 80, temperature 98 F (36.7 C), temperature source Oral, resp. rate 19, height 5\' 2"  (1.575 m), weight 100.9 kg, SpO2 98 %.        Intake/Output Summary (Last 24 hours) at 10/06/2020 1315 Last data filed at 10/06/2020 0849 Gross per 24 hour  Intake 577 ml  Output 550 ml  Net 27 ml   Filed Weights   10/04/20 1550 10/05/20  0201 10/06/20 0406  Weight: 99.8 kg 99.9 kg 100.9 kg    Examination: General: adult female lying in bed in NAD HEENT: MM pink/moist, anicteric  Neuro: AAOx4, speech clear, MAE  CV: s1s2 RRR, SR in 70's on monitor, no m/r/g PULM: non-labored at rest, lungs clear but diminished bilaterally  GI: soft, bsx4 active  Extremities: warm/dry, trace to 1+ BLE pitting edema  Skin: no rashes or lesions  Resolved Hospital Problem list     Assessment & Plan:   Chronic Dyspnea  Exertional Hypoxia  Chronic Hypercarbic Respiratory Failure  OSA  Mild Pulmonary Hypertension, Type II / III BMI 40   Patient has BMI 40 and known OSA. Suspected component of OHS as well (althought CO2 on BMP 26), but she has resting  hypercarbia on ABG this admit.  She has been non-compliant with BiPAP at home due to mask fit.  LHC reviewed which reveals elevated LV filling pressures consistent with possible mild volume overload and pulmonary hypertension.  Pulmonary HTN is likely Type II / III related to HTN and sleep disordered breathing.  CXR shows cephalization and hilar fullness.  VQ scan in the past and this admission does not suggest CTEPH.   -consider diuresis once out of window for dye load from [facility], return to home dosing at discharge (she uses additional dose if increased swelling) -discussed definitive treatment for OSA is tracheostomy which she would like to avoid -reviewed ENT surgical options would not likely be enough of a benefit to impact her OSA due to severity  -best option would be weight loss and compliance with BiPAP > we discussed her diet with diabetes extensively -encouraged healthy weight loss  -offered nutrition consult but she does not want at this time -reassess ambulatory O2 needs on day of discharge, plan for 2L O2 with exertion. We can reassess in office to see if she can come off  -follow up with Dr. [name] as outpatient -encouraged gradual exercise  NonObstructive CAD Chest Pain  -medical management per Cardiology   Macrocytic Anemia MCV >101, baseline Hgb ~11 -note on chronic PPI use and this can cause B12 deficiency  -supplement with B12 -defer iron studies to primary   DM  -reviewed DM diet with patient, recommended 50 carbs per meal and 15 for snack  Best practice (evaluated daily)  Diet: as tolerated  Pain/Anxiety/Delirium protocol (if indicated): n/a VAP protocol (if indicated): n/a DVT prophylaxis: enoxaparin GI prophylaxis: PPI  Glucose control: SSI Mobility: as tolerated  Disposition: per Cardiology   Goals of Care:  Last date of multidisciplinary goals of care discussion:   Family and staff present:  Summary of discussion:  Follow up goals of care discussion due:   Code Status: Full Code   Labs   CBC: Recent Labs  Lab 10/04/20 1555 10/05/20 0904 10/05/20 1456 10/05/20 1504 10/06/20 0105  WBC 5.8 5.6  --   --  4.9  NEUTROABS 3.6  --   --   --   --   HGB 11.8* 11.8* 11.9* 11.9* 11.5*  HCT 34.8* 34.1* 35.0* 35.0* 34.7*  MCV 100.0 100.6*  --   --  101.5*  PLT 164 164  --   --  144*    Basic Metabolic Panel: Recent Labs  Lab 10/04/20 1555 10/05/20 0228 10/05/20 0904 10/05/20 1456 10/05/20 1504 10/06/20 0105  NA 137 139  --  141 140 135  K 3.6 3.5  --  3.8 3.8 3.6  CL 98 99  --   --   --  99  CO2 28 28  --   --   --  26  GLUCOSE 288* 125*  --   --   --  280*  BUN 25* 25*  --   --   --  18  CREATININE 1.26* 1.16* 1.11*  --   --  1.13*  CALCIUM 9.2 9.0  --   --   --  8.4*   GFR: Estimated Creatinine Clearance: 56.6 mL/min (A) (by C-G formula based on SCr of 1.13 mg/dL (H)). Recent Labs  Lab 10/04/20 1555 10/05/20 0904 10/06/20 0105  WBC 5.8 5.6 4.9    Liver Function Tests: No results for input(s): AST, ALT, ALKPHOS, BILITOT, PROT, ALBUMIN in the last 168 hours. No results for input(s): LIPASE, AMYLASE in the last 168 hours. No results for input(s): AMMONIA in the last 168 hours.  ABG    Component Value Date/Time   PHART 7.409 10/05/2020 1504   PCO2ART 50.7 (H) 10/05/2020 1504   PO2ART 116 (H) 10/05/2020 1504   HCO3 32.1 (H) 10/05/2020 1504   TCO2 34 (H) 10/05/2020 1504   O2SAT 99.0 10/05/2020 1504     Coagulation Profile: No results for input(s): INR, PROTIME in the last 168 hours.  Cardiac Enzymes: No results for input(s): CKTOTAL, CKMB, CKMBINDEX, TROPONINI in the last 168 hours.  HbA1C: Hgb A1c MFr Bld  Date/Time Value Ref Range Status  10/05/2020 02:28 AM 9.5 (H) 4.8 - 5.6 % Final    Comment:    (NOTE) Pre diabetes:          5.7%-6.4%  Diabetes:              >6.4%  Glycemic control for   <7.0% adults with diabetes   08/21/2016 10:58 AM 8.7 (H) 4.8 - 5.6 % Final    Comment:    (NOTE)          Pre-diabetes: 5.7 - 6.4         Diabetes: >6.4         Glycemic control for adults with diabetes: <7.0     CBG: Recent Labs  Lab 10/05/20 1127 10/05/20 1613 10/05/20 2108 10/06/20 0613 10/06/20 1225  GLUCAP 153* 127* 278* 300* 285*    Review of Systems: Positives in [place]   Gen: Denies fever, chills, weight change, fatigue, night sweats HEENT: Denies blurred vision, double vision, hearing loss, tinnitus, sinus congestion, rhinorrhea, sore throat, neck stiffness, dysphagia PULM: Denies shortness of breath, cough, sputum production, hemoptysis, wheezing CV: Denies chest pain, edema, orthopnea, paroxysmal nocturnal dyspnea, palpitations GI: Denies abdominal pain, nausea, vomiting, diarrhea, hematochezia, melena, constipation, change in bowel habits GU: Denies dysuria, hematuria, polyuria, oliguria, urethral discharge Endocrine: Denies hot or cold intolerance, polyuria, polyphagia or appetite change Derm: Denies rash, dry skin, scaling or peeling skin change Heme: Denies easy bruising, bleeding, bleeding gums Neuro: Denies headache, numbness, weakness, slurred speech, loss of memory or consciousness  Past Medical History:  She,  has a past medical history of Anemia, Anxiety, Arthritis, Benign hypertension (03/14/2016), Bursitis of right hip, CAD (coronary artery disease), Chronic kidney disease, stage 3 (HCC), Chronic low back pain without sciatica ([id number]), Complication of anesthesia, Cough (04/19/2017), Depression, Dyspnea, Elevated liver enzymes (12/05/2016), Essential hypertension, GERD (gastroesophageal reflux disease), Gout (03/14/2016), Greater trochanteric bursitis of right hip (02/02/2012), H/O hiatal hernia, Heart murmur, History of blood transfusion (2016), History of kidney stones, Hypercholesterolemia, Iliotibial band syndrome of right side (02/02/2012), Iron deficiency anemia due to chronic blood loss (03/14/2016), Left bundle branch block, Lumbar  stenosis, Meralgia paraesthetica  (12/05/2016), Neuropathy, OSA (obstructive sleep apnea) (05/24/2016), PONV (postoperative nausea and vomiting), Restless leg syndrome, S/P lumbar laminectomy (11/26/2015), S/P lumbar spinal fusion (08/29/2016), Tinnitus (12/05/2016), and Type 2 diabetes mellitus ([place]).   Surgical History:   Past Surgical History:  Procedure Laterality Date  . ABDOMINAL HYSTERECTOMY  1983  . APPENDECTOMY  Age 74  . BACK SURGERY     FIRST LUMBAR FUSION/ SURGERY APRIL 2012 AND FUSION WITH INSTRUMENTATION SEPT 2012  . CARDIAC CATHETERIZATION     x 2  . CARPAL TUNNEL RELEASE     bil  . CHOLECYSTECTOMY  1990's  . CYSTO EXTRACTION KIDNEY STONES    . EXCISION/RELEASE BURSA HIP  02/02/2012   Procedure: EXCISION/RELEASE BURSA HIP;  Surgeon: Tobi Bastos, MD;  Location: [facility];  Service: Orthopedics;  Laterality: Right;  Right Hip Bursectomy  . EYE SURGERY Bilateral    cataracts  . KIDNEY STONE SURGERY  2008  . Left knee surgery x 2  1996   reconstruction  . LUMBAR DISC SURGERY  08/2016  . LUMBAR LAMINECTOMY/DECOMPRESSION MICRODISCECTOMY Right 11/26/2015   Procedure: Extraforaminal Microdiscectomy  - Lumbar two-three- right;  Surgeon: Eustace Moore, MD;  Location: [place] [facility];  Service: Neurosurgery;  Laterality: Right;  right   . LUMBAR WOUND DEBRIDEMENT N/A 10/25/2016   Procedure: Lumbar wound revision;  Surgeon: Eustace Moore, MD;  Location: [place];  Service: Neurosurgery;  Laterality: N/A;  Lumbar wound revision  . Right shoulder surgery  2010   spur  . RIGHT/LEFT HEART CATH AND CORONARY ANGIOGRAPHY N/A 10/05/2020   Procedure: RIGHT/LEFT HEART CATH AND CORONARY ANGIOGRAPHY;  Surgeon: Martinique, Peter M, MD;  Location: [place] [facility];  Service: Cardiovascular;  Laterality: N/A;     Social History:   reports that she has never smoked. She has never used smokeless tobacco. She reports current alcohol use. She reports that she does not use drugs.   Family History:  Her family history includes Asthma in her  sister; Bone cancer in her sister; Heart failure in her mother; Hypertension in her father; Lung cancer in her father; Stroke in her father.   Allergies Allergies  Allergen Reactions  . Atorvastatin Nausea And Vomiting and Other (See Comments)    MYALGIAS  . Talwin [Pentazocine] Other (See Comments)    headache  . Other Nausea Only    UNSPECIFIED Anesthesia     Home Medications  Prior to Admission medications   Medication Sig Start Date End Date Taking? Authorizing Provider  allopurinol (ZYLOPRIM) 100 MG tablet Take 100 mg by mouth at bedtime. 10/20/19  Yes [provider]  aspirin EC 81 MG tablet Take 81 mg by mouth at bedtime. Swallow whole.   Yes [provider]  carvedilol (COREG) 6.25 MG tablet Take 6.25 mg by mouth 2 (two) times daily.  07/09/17  Yes [provider]  DULoxetine (CYMBALTA) 60 MG capsule Take 60 mg by mouth at bedtime.    Yes [provider]  GLUCOSAMINE-CHONDROITIN PO Take 1 tablet by mouth 2 (two) times daily.   Yes [provider]  insulin NPH-regular Human (NOVOLIN 70/30) (70-30) 100 UNIT/ML injection Inject 35-100 Units into the skin See admin instructions. Inject 35-100 units subcutaneously four times daily - before meals and at bedtime per sliding scale: CBG 150 35 units, for every 10 points over 150 add 1 unit insulin.   Yes [provider]  mirtazapine (REMERON) 15 MG tablet Take 15 mg by mouth at  bedtime.   Yes [provider]  Multiple Vitamin (MULTIVITAMIN WITH MINERALS) TABS tablet Take 1 tablet by mouth daily. Centrum Silver Women   Yes [provider]  nitroGLYCERIN (NITROSTAT) 0.4 MG SL tablet Place 0.4 mg under the tongue every 5 (five) minutes as needed for chest pain.    Yes [provider]  pantoprazole (PROTONIX) 40 MG tablet Take 1 tablet by mouth once daily Patient taking differently: Take 40 mg by mouth daily. 05/03/20  Yes Baldo Daub, MD  pramipexole (MIRAPEX) 1  MG tablet Take 1 mg by mouth at bedtime.   Yes [provider]  pregabalin (LYRICA) 50 MG capsule Take 50 mg by mouth at bedtime. 08/16/20  Yes [provider]  torsemide (DEMADEX) 20 MG tablet TAKE 1 TABLET BY MOUTH TWICE DAILY. TAKE ONCE A DAY FOR WEIGHT <200 LBS Patient taking differently: Take 20 mg by mouth See admin instructions. Take one tablet (20 mg) by mouth every morning, may take an additional tablet (20 mg) as needed for feet/ankle swelling 10/06/19  Yes Baldo Daub, MD  TURMERIC PO Take 1,000 mg by mouth daily.   Yes [provider]  valACYclovir (VALTREX) 1000 MG tablet Take 1,000 mg by mouth See admin instructions. Take one tablet (1000 mg) by mouth twice daily for 2 days, then take one tablet (1000 mg) daily for 2 days - as needed for fever blisters   Yes [provider]  ONETOUCH VERIO test strip USE 1 STRIP TO CHECK GLUCOSE 4 TIMES DAILY AS DIRECTED 02/07/19   [provider]     Critical care time:      Canary Brim, MSN, NP-C, AGACNP-BC [facility] 10/06/2020, 1:15 PM   Please see [email or web address] for pager details.

## 2020-10-07 ENCOUNTER — Encounter (HOSPITAL_COMMUNITY): Payer: Self-pay | Admitting: Student

## 2020-10-07 DIAGNOSIS — J9611 Chronic respiratory failure with hypoxia: Secondary | ICD-10-CM

## 2020-10-07 DIAGNOSIS — R0902 Hypoxemia: Secondary | ICD-10-CM

## 2020-10-07 DIAGNOSIS — Z8719 Personal history of other diseases of the digestive system: Secondary | ICD-10-CM

## 2020-10-07 DIAGNOSIS — N183 Chronic kidney disease, stage 3 unspecified: Secondary | ICD-10-CM | POA: Insufficient documentation

## 2020-10-07 DIAGNOSIS — I5032 Chronic diastolic (congestive) heart failure: Secondary | ICD-10-CM | POA: Diagnosis not present

## 2020-10-07 DIAGNOSIS — N390 Urinary tract infection, site not specified: Secondary | ICD-10-CM | POA: Diagnosis not present

## 2020-10-07 DIAGNOSIS — R29898 Other symptoms and signs involving the musculoskeletal system: Secondary | ICD-10-CM

## 2020-10-07 HISTORY — DX: Hypoxemia: R09.02

## 2020-10-07 HISTORY — DX: Other symptoms and signs involving the musculoskeletal system: R29.898

## 2020-10-07 HISTORY — DX: Chronic kidney disease, stage 3 unspecified: N18.30

## 2020-10-07 HISTORY — DX: Personal history of other diseases of the digestive system: Z87.19

## 2020-10-07 HISTORY — DX: Urinary tract infection, site not specified: N39.0

## 2020-10-07 HISTORY — DX: Morbid (severe) obesity due to excess calories: E66.01

## 2020-10-07 HISTORY — DX: Chronic respiratory failure with hypoxia: J96.11

## 2020-10-07 LAB — URINALYSIS, ROUTINE W REFLEX MICROSCOPIC
Bilirubin Urine: NEGATIVE
Glucose, UA: >=500 mg/dL — AB
Hgb urine dipstick: NEGATIVE
Ketones, ur: NEGATIVE mg/dL
Nitrite: POSITIVE — AB
Protein, ur: NEGATIVE mg/dL
Specific Gravity, Urine: 1.018 (ref 1.005–1.030)
WBC, UA: >50 WBC/hpf — ABNORMAL HIGH (ref 0–5)
pH: 5 (ref 5.0–8.0)

## 2020-10-07 LAB — BASIC METABOLIC PANEL
Anion gap: 10 (ref 5–15)
BUN: 18 mg/dL (ref 8–23)
CO2: 25 mmol/L (ref 22–32)
Calcium: 8.7 mg/dL — ABNORMAL LOW (ref 8.9–10.3)
Chloride: 98 mmol/L (ref 98–111)
Creatinine, Ser: 1.01 mg/dL — ABNORMAL HIGH (ref 0.44–1.00)
GFR, Estimated: >60 mL/min (ref >60)
Glucose, Bld: 294 mg/dL — ABNORMAL HIGH (ref 70–99)
Potassium: 4.3 mmol/L (ref 3.5–5.1)
Sodium: 133 mmol/L — ABNORMAL LOW (ref 135–145)

## 2020-10-07 LAB — GLUCOSE, CAPILLARY
Glucose-Capillary: 322 mg/dL — ABNORMAL HIGH (ref 70–99)
Glucose-Capillary: 327 mg/dL — ABNORMAL HIGH (ref 70–99)

## 2020-10-07 MED ORDER — SULFAMETHOXAZOLE-TRIMETHOPRIM 800-160 MG PO TABS: 1.0000 | ORAL_TABLET | Freq: Two times a day (BID) | ORAL | 0 refills | Status: DC

## 2020-10-07 MED ORDER — ACETAMINOPHEN 325 MG PO TABS
650.0000 mg | ORAL_TABLET | ORAL | Status: DC | PRN
Start: 2020-10-07 — End: 2023-01-08

## 2020-10-07 MED ORDER — SULFAMETHOXAZOLE-TRIMETHOPRIM 800-160 MG PO TABS
1.0000 | ORAL_TABLET | Freq: Two times a day (BID) | ORAL | Status: DC
  Administered 2020-10-07 (×2): 1 via ORAL
  Filled 2020-10-07: qty 1

## 2020-10-07 MED ORDER — TORSEMIDE 20 MG PO TABS
20.0000 mg | ORAL_TABLET | Freq: Every day | ORAL | Status: DC
  Administered 2020-10-07: 08:00:00 20 mg via ORAL
  Filled 2020-10-07: qty 1

## 2020-10-07 MED ORDER — ROSUVASTATIN CALCIUM 10 MG PO TABS: 10.0000 mg | ORAL_TABLET | Freq: Every day | ORAL | 6 refills | Status: DC

## 2020-10-07 NOTE — Progress Notes (Addendum)
Progress Note  Patient Name: Katelyn [name] Date of Encounter: 10/07/2020  [facility] Cardiologist: Shirlee More, MD   Subjective   No complaints, slept better with oxygen.  She will wear with activity and we prefer bipap she does have 02 if needed  Inpatient Medications    Scheduled Meds: . allopurinol  100 mg Oral QHS  . aspirin EC  81 mg Oral Daily  . carvedilol  6.25 mg Oral BID  . DULoxetine  60 mg Oral QHS  . enoxaparin (LOVENOX) injection  40 mg Subcutaneous Q24H  . insulin aspart  0-15 Units Subcutaneous TID WC  . insulin aspart  0-5 Units Subcutaneous QHS  . insulin aspart  4 Units Subcutaneous TID WC  . insulin glargine  20 Units Subcutaneous Daily  . mirtazapine  15 mg Oral QHS  . pantoprazole  40 mg Oral Daily  . pramipexole  1 mg Oral QHS  . pregabalin  50 mg Oral QHS  . rosuvastatin  10 mg Oral Daily  . sodium chloride flush  3 mL Intravenous Q12H  . sodium chloride flush  3 mL Intravenous Q12H  . torsemide  20 mg Oral Daily   Continuous Infusions: . sodium chloride     PRN Meds: sodium chloride, acetaminophen, nitroGLYCERIN, ondansetron (ZOFRAN) IV, sodium chloride flush   Vital Signs    Vitals:   10/06/20 2038 10/07/20 0007 10/07/20 0342 10/07/20 0610  BP: 137/60 (!) 140/58 (!) 113/48   Pulse: 73 74 75   Resp: 16 16 16    Temp: 98.2 F (36.8 C) 98.1 F (36.7 C) 98.1 F (36.7 C)   TempSrc: Oral Oral Oral   SpO2: 95% 98% 98%   Weight:    100.1 kg  Height:        Intake/Output Summary (Last 24 hours) at 10/07/2020 0631 Last data filed at 10/07/2020 [id number] Gross per 24 hour  Intake 730 ml  Output 1800 ml  Net -1070 ml   Last 3 Weights 10/07/2020 10/06/2020 10/05/2020  Weight (lbs) 220 lb 10.9 oz 222 lb 6.4 oz 220 lb 4.8 oz  Weight (kg) 100.1 kg 100.88 kg 99.927 kg      Telemetry    SR - Personally Reviewed  ECG    No new - Personally Reviewed  Physical Exam   GEN: No acute distress.   Neck: No JVD Cardiac: RRR, no murmurs,  rubs, or gallops.  Respiratory: Clear to auscultation bilaterally. GI: Soft, nontender, non-distended  MS: No edema; No deformity. Neuro:  Nonfocal  Psych: Normal affect   Labs    High Sensitivity Troponin:   Recent Labs  Lab 10/04/20 1555 10/04/20 1842  TROPONINIHS 11 11      Chemistry Recent Labs  Lab 10/05/20 0228 10/05/20 0904 10/05/20 1456 10/05/20 1504 10/06/20 0105 10/07/20 0116  NA 139  --    < > 140 135 133*  K 3.5  --    < > 3.8 3.6 4.3  CL 99  --   --   --  99 98  CO2 28  --   --   --  26 25  GLUCOSE 125*  --   --   --  280* 294*  BUN 25*  --   --   --  18 18  CREATININE 1.16* 1.11*  --   --  1.13* 1.01*  CALCIUM 9.0  --   --   --  8.4* 8.7*  GFRNONAA 53* 56*  --   --  55* >60  ANIONGAP 12  --   --   --  10 10   < > = values in this interval not displayed.     Hematology Recent Labs  Lab 10/04/20 1555 10/05/20 0904 10/05/20 1456 10/05/20 1504 10/06/20 0105  WBC 5.8 5.6  --   --  4.9  RBC 3.48* 3.39*  --   --  3.42*  HGB 11.8* 11.8* 11.9* 11.9* 11.5*  HCT 34.8* 34.1* 35.0* 35.0* 34.7*  MCV 100.0 100.6*  --   --  101.5*  MCH 33.9 34.8*  --   --  33.6  MCHC 33.9 34.6  --   --  33.1  RDW 12.9 13.2  --   --  13.1  PLT 164 164  --   --  144*    BNP Recent Labs  Lab 10/04/20 1555  BNP 32.9     DDimer  Recent Labs  Lab 10/04/20 1555  DDIMER 0.30     Radiology    DG Chest 1 View  Result Date: 10/06/2020 CLINICAL DATA:  Chest x-ray for use with nuclear medicine PE study. EXAM: CHEST  1 VIEW COMPARISON:  DECEMBER 27, 21. FINDINGS: The heart size and mediastinal contours are within normal limits. Both lungs are clear. No visible pleural effusions or pneumothorax. No acute osseous abnormality. IMPRESSION: No acute cardiopulmonary disease. Electronically Signed   By: Margaretha Sheffield MD   On: 10/06/2020 11:52   NM Pulmonary Perfusion  Result Date: 10/06/2020 CLINICAL DATA:  Pulmonary arterial hypertension with elevated right heart  pressure. EXAM: NUCLEAR MEDICINE PERFUSION LUNG SCAN TECHNIQUE: Perfusion images were obtained in multiple projections after intravenous injection of radiopharmaceutical. Views: Anterior, posterior, left lateral, right lateral, RPO, LPO, RAO, LAO. RADIOPHARMACEUTICALS:  4.4 mCi Tc-15m MAA IV COMPARISON:  Chest radiograph October 04, 2020 FINDINGS: Radiotracer uptake is homogeneous and symmetric bilaterally. No perfusion defects evident. IMPRESSION: No evident perfusion defects. No findings indicative of pulmonary embolus. Electronically Signed   By: Lowella Grip III M.D.   On: 10/06/2020 12:55   CARDIAC CATHETERIZATION  Result Date: 10/06/2020  Prox LAD lesion is 30% stenosed.  1st Diag lesion is 55% stenosed.  Dist Cx lesion is 40% stenosed.  Prox RCA lesion is 25% stenosed.  Dist RCA lesion is 25% stenosed.  There is mild left ventricular systolic dysfunction.  LV end diastolic pressure is moderately elevated.  The left ventricular ejection fraction is 50-55% by visual estimate.  Hemodynamic findings consistent with mild pulmonary hypertension.  1. Nonobstructive CAD. Codominant circulation 2. Low normal LV systolic function. EF estimated at 50%. 3. Elevated LV filling pressures - PCWP 22 mm Hg, EDP 25 mm Hg 4. Mild pulmonary HTN with mean PAP 27 mm Hg 5. Elevated RA pressures 16 mm Hg 6. Reduced cardiac output with index 2.5 Plan: medical management for CAD. Need to repeat Echo to assess LV and RV function. May need to consider pulmonary embolic disease given pulmonary HTN and elevated RA pressures. This may reflect elevated Left heart pressures. Morbid obesity and OSA may also be contributing.   ECHOCARDIOGRAM COMPLETE  Result Date: 10/06/2020    ECHOCARDIOGRAM REPORT   Patient Name:   Katelyn [name] Date of Exam: 10/06/2020 Medical Rec #:  [id number]   Height:       62.0 in Accession #:    [id number]  Weight:       222.4 lb Date of Birth:  28-Oct-1955   BSA:  2.000 m Patient Age:     64 years    BP:           145/73 mmHg Patient Gender: F           HR:           77 bpm. Exam Location:  Inpatient Procedure: 2D Echo, 3D Echo, Color Doppler, Cardiac Doppler and Strain Analysis Indications:    Pulmonary hypertension 416.8 / I27.2  History:        Patient has prior history of Echocardiogram examinations, most                 recent 04/28/2019. CAD, Arrythmias:LBBB; Risk Factors:Sleep                 Apnea, Hypertension, Diabetes and Dyslipidemia. GERD.  Sonographer:    Darlina Sicilian RDCS Referring Phys: [id number] PETER M [place] IMPRESSIONS  1. Left ventricular ejection fraction, by estimation, is 55 to 60%. The left ventricle has normal function. The left ventricle has no regional wall motion abnormalities. There is mild left ventricular hypertrophy. Left ventricular diastolic parameters are indeterminate.  2. Right ventricular systolic function is normal. The right ventricular size is normal. Tricuspid regurgitation signal is inadequate for assessing PA pressure.  3. The mitral valve is normal in structure. No evidence of mitral valve regurgitation. No evidence of mitral stenosis.  4. The aortic valve is tricuspid. Aortic valve regurgitation is not visualized. No aortic stenosis is present. FINDINGS  Left Ventricle: Left ventricular ejection fraction, by estimation, is 55 to 60%. The left ventricle has normal function. The left ventricle has no regional wall motion abnormalities. The left ventricular internal cavity size was normal in size. There is  mild left ventricular hypertrophy. Left ventricular diastolic parameters are indeterminate. Right Ventricle: The right ventricular size is normal. No increase in right ventricular wall thickness. Right ventricular systolic function is normal. Tricuspid regurgitation signal is inadequate for assessing PA pressure. The tricuspid regurgitant velocity is 1.34 m/s, and with an assumed right atrial pressure of 3 mmHg, the estimated right ventricular systolic  pressure is [id number] mmHg. Left Atrium: Left atrial size was normal in size. Right Atrium: Right atrial size was normal in size. Pericardium: There is no evidence of pericardial effusion. Mitral Valve: The mitral valve is normal in structure. No evidence of mitral valve regurgitation. No evidence of mitral valve stenosis. Tricuspid Valve: The tricuspid valve is normal in structure. Tricuspid valve regurgitation is trivial. Aortic Valve: The aortic valve is tricuspid. Aortic valve regurgitation is not visualized. No aortic stenosis is present. Pulmonic Valve: The pulmonic valve was not well visualized. Pulmonic valve regurgitation is not visualized. Aorta: The aortic root and ascending aorta are structurally normal, with no evidence of dilitation. IAS/Shunts: The interatrial septum was not well visualized.  LEFT VENTRICLE PLAX 2D LVIDd:         4.80 cm      Diastology LVIDs:         3.20 cm      LV e' medial:    6.65 cm/s LV PW:         0.90 cm      LV E/e' medial:  14.7 LV IVS:        1.00 cm      LV e' lateral:   7.38 cm/s LVOT diam:     2.00 cm      LV E/e' lateral: 13.2 LV SV:         72 LV SV  Index:   36 LVOT Area:     3.14 cm                              3D Volume EF: LV Volumes (MOD)            3D EF:        58 % LV vol d, MOD A2C: 93.1 ml  LV EDV:       174 ml LV vol d, MOD A4C: 120.0 ml LV ESV:       73 ml LV vol s, MOD A2C: 33.6 ml  LV SV:        101 ml LV vol s, MOD A4C: 49.0 ml LV SV MOD A2C:     59.5 ml LV SV MOD A4C:     120.0 ml LV SV MOD BP:      64.5 ml RIGHT VENTRICLE RV S prime:     13.50 cm/s TAPSE (M-mode): 3.0 cm LEFT ATRIUM             Index       RIGHT ATRIUM          Index LA diam:        4.10 cm 2.05 cm/m  RA Area:     9.25 cm LA Vol (A2C):   47.0 ml 23.50 ml/m RA Volume:   14.40 ml 7.20 ml/m LA Vol (A4C):   50.8 ml 25.40 ml/m LA Biplane Vol: 51.1 ml 25.55 ml/m  AORTIC VALVE LVOT Vmax:   112.00 cm/s LVOT Vmean:  79.200 cm/s LVOT VTI:    0.229 m  AORTA Ao Root diam: 2.70 cm Ao Asc diam:   2.90 cm MITRAL VALVE               TRICUSPID VALVE MV Area (PHT): 4.68 cm    TR Peak grad:   7.2 mmHg MV Decel Time: 162 msec    TR Vmax:        134.00 cm/s MV E velocity: 97.70 cm/s MV A velocity: 75.80 cm/s  SHUNTS MV E/A ratio:  1.29        Systemic VTI:  0.23 m                            Systemic Diam: 2.00 cm Oswaldo Milian MD Electronically signed by Oswaldo Milian MD Signature Date/Time: 10/06/2020/5:17:16 PM    Final     Cardiac Studies   Echo 10/06/20 IMPRESSIONS    1. Left ventricular ejection fraction, by estimation, is 55 to 60%. The  left ventricle has normal function. The left ventricle has no regional  wall motion abnormalities. There is mild left ventricular hypertrophy.  Left ventricular diastolic parameters  are indeterminate.  2. Right ventricular systolic function is normal. The right ventricular  size is normal. Tricuspid regurgitation signal is inadequate for assessing  PA pressure.  3. The mitral valve is normal in structure. No evidence of mitral valve  regurgitation. No evidence of mitral stenosis.  4. The aortic valve is tricuspid. Aortic valve regurgitation is not  visualized. No aortic stenosis is present.   FINDINGS  Left Ventricle: Left ventricular ejection fraction, by estimation, is 55  to 60%. The left ventricle has normal function. The left ventricle has no  regional wall motion abnormalities. The left ventricular internal cavity  size was normal in size. There is  mild left ventricular hypertrophy.  Left ventricular diastolic parameters  are indeterminate.   Right Ventricle: The right ventricular size is normal. No increase in  right ventricular wall thickness. Right ventricular systolic function is  normal. Tricuspid regurgitation signal is inadequate for assessing PA  pressure. The tricuspid regurgitant  velocity is 1.34 m/s, and with an assumed right atrial pressure of 3 mmHg,  the estimated right ventricular systolic pressure  is [id number] mmHg.   Left Atrium: Left atrial size was normal in size.   Right Atrium: Right atrial size was normal in size.   Pericardium: There is no evidence of pericardial effusion.   Mitral Valve: The mitral valve is normal in structure. No evidence of  mitral valve regurgitation. No evidence of mitral valve stenosis.   Tricuspid Valve: The tricuspid valve is normal in structure. Tricuspid  valve regurgitation is trivial.   Aortic Valve: The aortic valve is tricuspid. Aortic valve regurgitation is  not visualized. No aortic stenosis is present.   Pulmonic Valve: The pulmonic valve was not well visualized. Pulmonic valve  regurgitation is not visualized.   Aorta: The aortic root and ascending aorta are structurally normal, with  no evidence of dilitation.   IAS/Shunts: The interatrial septum was not well visualized.     LEFT VENTRICLE  PLAX 2D  LVIDd:     4.80 cm   Diastology  LVIDs:     3.20 cm   LV e' medial:  6.65 cm/s  LV PW:     0.90 cm   LV E/e' medial: 14.7  LV IVS:    1.00 cm   LV e' lateral:  7.38 cm/s  LVOT diam:   2.00 cm   LV E/e' lateral: 13.2  LV SV:     72  LV SV Index:  36  LVOT Area:   3.14 cm                 3D Volume EF:  LV Volumes (MOD)      3D EF:    58 %  LV vol d, MOD A2C: 93.1 ml LV EDV:    174 ml  LV vol d, MOD A4C: 120.0 ml LV ESV:    73 ml  LV vol s, MOD A2C: 33.6 ml LV SV:    101 ml  LV vol s, MOD A4C: 49.0 ml  LV SV MOD A2C:   59.5 ml  LV SV MOD A4C:   120.0 ml  LV SV MOD BP:   64.5 ml    Cardiac cath 10/05/20    Prox LAD lesion is 30% stenosed.  1st Diag lesion is 55% stenosed.  Dist Cx lesion is 40% stenosed.  Prox RCA lesion is 25% stenosed.  Dist RCA lesion is 25% stenosed.  There is mild left ventricular systolic dysfunction.  LV end diastolic pressure is moderately elevated.  The left ventricular ejection fraction is 50-55% by  visual estimate.  Hemodynamic findings consistent with mild pulmonary hypertension.    1. Nonobstructive CAD. Codominant circulation 2. Low normal LV systolic function. EF estimated at 50%. 3. Elevated LV filling pressures - PCWP 22 mm Hg, EDP 25 mm Hg 4. Mild pulmonary HTN with mean PAP 27 mm Hg 5. Elevated RA pressures 16 mm Hg 6. Reduced cardiac output with index 2.5  Plan: medical management for CAD. Need to repeat Echo to assess LV and RV function. May need to consider pulmonary embolic disease given pulmonary HTN and elevated RA pressures. This may reflect elevated Left heart pressures. Morbid  obesity and OSA may also be contributing.  Flowsheet Row Most Recent Value  Fick Cardiac Output 5 L/min  Fick Cardiac Output Index 2.51 (L/min)/BSA  RA A Wave 24 mmHg  RA V Wave 17 mmHg  RA Mean 16 mmHg  RV Systolic Pressure 47 mmHg  RV Diastolic Pressure 9 mmHg  RV EDP 20 mmHg  PA Systolic Pressure 42 mmHg  PA Diastolic Pressure 19 mmHg  PA Mean 27 mmHg  PW A Wave 21 mmHg  PW V Wave 23 mmHg  PW Mean 22 mmHg  AO Systolic Pressure 135 mmHg  AO Diastolic Pressure 71 mmHg  AO Mean 97 mmHg  LV Systolic Pressure 135 mmHg  LV Diastolic Pressure 16 mmHg  LV EDP 25 mmHg  AOp Systolic Pressure 142 mmHg  AOp Diastolic Pressure 67 mmHg  AOp Mean Pressure 97 mmHg  LVp Systolic Pressure 144 mmHg  LVp Diastolic Pressure 23 mmHg  LVp EDP Pressure 29 mmHg  QP/QS 1  TPVR Index 10.76 HRUI  TSVR Index 38.65 HRUI  PVR SVR Ratio 0.06  TPVR/TSVR Ratio 0.28      Patient Profile     64 y.o. female with history ofmild CAD by cath 2015 (50% mid LCx at [facility]), hypertensive heart disease, CKD stage IIIa, arthritis, anemia, anxiety, depression, HTN, HLD, lumbar stenosis, OSA(uses [place]/intolerant of CPAP), DM, morbid obesity, chronic DOE who presented to [facility] with worsening DOE and chest/back discomfort  Assessment & Plan    1. Chest painwithacute on chronic dyspneawith history of  mild CAD by cath 2014- question if this represents progressive angina - EKG shows chronic LBBB, troponins negative x2, and BNP wnl - Covid swab negative -- given hypertensive heart disease/chronic diuretic use, cath was done yesterday. --Held torsemide will resume today  --non obstructive CAD with 1st diag of 55%, dist LCX 40% and rest 25 - 39%  Mild LV systolic dysfunction with EF 50-55%  Mild pulmonary HTN.   PCWP 22 mm Hg, EDP 25 mm Hg mean PAP 27 mm Hg  Elevated RA pressures 16 mm Hg --reduced CO with CI of 2.5  --Echo with EF 55-60% no RWMA, mild LVH LV diastolic parameters are indeterminate.  Unable to assessing PA pressure --VQ neg for PE Discharge to day , oxygen is in room.    2.CKD stage IIIa -stable by labs - follow post-cath and today 1.01 from 1.11 resume torsemide  3. History of anemiawith mild decrease in Hgb compared toprioralthough similar to previous years -Hgb baseline 10-11 in prior years, 13.2 in 06/2019 (seems to be outlier) - 11.8 on admission and today 11.5  -continue to monitor, no bleeding reported - f/u CBC tomorrow  4. OSA with morbid obesity, intolerant to CPAP/BIPAP - lifestyle modification will be important as OP -during the night sp02 down to 83%  ? home oxygen since she cannot tolerate Bipap mask?   Will eval sp02 with ambualtion.  - hypoxic with ambulation Pulmonary consult obtained.  They will follow as outpt.  5.Hypertensive heart disease - BNP wnl, holding home torsemide Resume today - follow as above  6.IDDM - A1C 9.5 - DM coordinator consulted "Recommended that patient use sensor when she gets home so that she can monitor closely for both high and low blood sugars. She verbalized understanding and also states she will f/u with Dr. Evlyn Kanner."   While in the hospital, consider adding Lantus 20 units daily. Once eating, consider also adding Novolog meal coverage 4 units tid with meals.  - close  OP f/u recommended - SSI with stable  glucose until last PM 278 and 280 at 0100 will add meal coverage as recommended.and added Lantus For discharge resume home insulin and follow up with Dr. Forde Dandy.  I reached out to his office yesterday to call pt.  --at home on 70/30 insulin and SS of 35 to 100 u before meals and at bedtime.   7. Hyperlipidemia - given mild CAD and DM, would suggest initiation of statin. Prior myalgias, nausea, vomiting noted with atorvastatin so will trial Crestor 10mg  daily to start(patient expresses concern due to other family members having statin issues in the past as well) -If the patient is tolerating statin at time of follow-up appointment, would consider rechecking liver function/lipid panel in 6-8 weeks  8.  Esophageal stricture now with difficult swallowing food, prob send back to GI for eval.  also complains of gas   9.  Hypoxic will have home 02 with ambulation, use Bipap   10.  Leg weakness follow up with neurosurgeon. I reached Dr. Ronnald Ramp office and they will call pt.    11. Complains of foul smelling urine  Will check u/a   For questions or updates, please contact [place] Please consult [email or web address] for contact info under        Signed, Cecilie Kicks, NP  10/07/2020, 6:31 AM    History and all data above reviewed.  Patient examined.  I agree with the findings as above.  OK to discharge.  Breathing better with O2.  The patient exam reveals COR:RRR  ,  Lungs: Clear  ,  Abd: Positive bowel sounds, no rebound no guarding, Ext No edema  .  All available labs, radiology testing, previous records reviewed. Agree with documented assessment and plan.   Hypoxemia:  I appreciate the pulmonary note.  I have instructed her to take torsemide bid x 3 days then back to her previous dose of once daily.  She needs weight loss, glucose control, sleep apnea treatment, and pulmonary follow up.  She needs low salt and prn diuresis.  We discuss this.  UTI:  Noted to have this and she will get antibiotics  to take at discharge.  Follow up with Dr. Bettina Gavia as scheduled.    Minus Breeding  9:56 AM  10/07/2020

## 2020-10-07 NOTE — Progress Notes (Signed)
Inpatient Diabetes Program Recommendations  AACE/ADA: New Consensus Statement on Inpatient Glycemic Control (2015)  Target Ranges:  Prepandial:   less than 140 mg/dL      Peak postprandial:   less than 180 mg/dL (1-2 hours)      Critically ill patients:  140 - 180 mg/dL   Lab Results  Component Value Date   GLUCAP 322 (H) 10/07/2020   HGBA1C 9.5 (H) 10/05/2020    Review of Glycemic Control Results for Katelyn [name], Katelyn [name] (MRN [id number]) as of 10/07/2020 07:56  Ref. Range 10/06/2020 06:13 10/06/2020 12:25 10/06/2020 16:33 10/06/2020 21:02 10/07/2020 06:12  Glucose-Capillary Latest Ref Range: 70 - 99 mg/dL 481 (H) 856 (H) 314 (H) 287 (H) 322 (H)   Diabetes history: DM 2 Outpatient Diabetes medications:  Novolin 70/30 35-100 units tid with meals Current orders for Inpatient glycemic control:  Novolog moderate tid with meals and HS Novolog 4 units TID with meals  Lantus 20 units daily  Inpatient Diabetes Program Recommendations:     Lantus 35 units daily  Will continue to follow while inpatient.  Thank you, Dulce Sellar, RN, BSN Diabetes Coordinator Inpatient Diabetes Program [phone] (team pager from 8a-5p)

## 2020-10-07 NOTE — Progress Notes (Signed)
Mobility Specialist - Progress Note   10/07/20 1109  Mobility  Activity Ambulated in hall  Level of Assistance Standby assist, set-up cues, supervision of patient - no hands on  Assistive Device Front wheel walker  Distance Ambulated (ft) 300 ft  Mobility Response Tolerated well  Mobility performed by Mobility specialist  $Mobility charge 1 Mobility    Pre-mobility: 85 HR, 95% SpO2 During mobility: 107 HR, 96% SpO2 Post-mobility: 97 HR, 97% SpO2  Pt ambulated on 2L O2. One brief rest break in order to catch her breath, otherwise asx. Pt sitting up on edge of bed after walk.   Mamie Levers Mobility Specialist Mobility Specialist Phone: [phone]

## 2020-10-07 NOTE — Discharge Summary (Addendum)
Discharge Summary    Patient ID: Katelyn [name] MRN: [id number]; DOB: 09/15/1956  Admit date: 10/04/2020 Discharge date: 10/07/2020  Primary Care Provider: Olive Bassough, Robert L, MD  Primary Cardiologist: Norman HerrlichBrian Munley, MD  Primary Electrophysiologist:  None   Discharge Diagnoses    Principal Problem:   Chest pain, neg MI, stable CAD non obstructive on cath 10/05/20 Active Problems:   DOE (dyspnea on exertion)   Hypertensive heart disease with heart failure (HCC)   Hyperlipemia   OSA (obstructive sleep apnea)   Type 2 diabetes mellitus without complication, with long-term current use of insulin (HCC)   Chronic diastolic heart failure (HCC)   Shortness of breath   Morbid obesity (HCC)   Hypoxia   History of esophageal stricture   CKD (chronic kidney disease), stage III (HCC)   Leg weakness   UTI (urinary tract infection)    Diagnostic Studies/Procedures    Cardiac cath 10/05/20    Prox LAD lesion is 30% stenosed.  1st Diag lesion is 55% stenosed.  Dist Cx lesion is 40% stenosed.  Prox RCA lesion is 25% stenosed.  Dist RCA lesion is 25% stenosed.  There is mild left ventricular systolic dysfunction.  LV end diastolic pressure is moderately elevated.  The left ventricular ejection fraction is 50-55% by visual estimate.  Hemodynamic findings consistent with mild pulmonary hypertension.    1. Nonobstructive CAD. Codominant circulation 2. Low normal LV systolic function. EF estimated at 50%. 3. Elevated LV filling pressures - PCWP 22 mm Hg, EDP 25 mm Hg 4. Mild pulmonary HTN with mean PAP 27 mm Hg 5. Elevated RA pressures 16 mm Hg 6. Reduced cardiac output with index 2.5  Plan: medical management for CAD. Need to repeat Echo to assess LV and RV function. May need to consider pulmonary embolic disease given pulmonary HTN and elevated RA pressures. This may reflect elevated Left heart pressures. Morbid obesity and OSA may also be contributing.  Flowsheet Row Most  Recent Value  Fick Cardiac Output 5 L/min  Fick Cardiac Output Index 2.51 (L/min)/BSA  RA A Wave 24 mmHg  RA V Wave 17 mmHg  RA Mean 16 mmHg  RV Systolic Pressure 47 mmHg  RV Diastolic Pressure 9 mmHg  RV EDP 20 mmHg  PA Systolic Pressure 42 mmHg  PA Diastolic Pressure 19 mmHg  PA Mean 27 mmHg  PW A Wave 21 mmHg  PW V Wave 23 mmHg  PW Mean 22 mmHg  AO Systolic Pressure 135 mmHg  AO Diastolic Pressure 71 mmHg  AO Mean 97 mmHg  LV Systolic Pressure 135 mmHg  LV Diastolic Pressure 16 mmHg  LV EDP 25 mmHg  AOp Systolic Pressure 142 mmHg  AOp Diastolic Pressure 67 mmHg  AOp Mean Pressure 97 mmHg  LVp Systolic Pressure 144 mmHg  LVp Diastolic Pressure 23 mmHg  LVp EDP Pressure 29 mmHg  QP/QS 1  TPVR Index 10.76 HRUI  TSVR Index 38.65 HRUI  PVR SVR Ratio 0.06  TPVR/TSVR Ratio 0.28   Echo 10/06/20  ________IMPRESSIONS    1. Left ventricular ejection fraction, by estimation, is 55 to 60%. The  left ventricle has normal function. The left ventricle has no regional  wall motion abnormalities. There is mild left ventricular hypertrophy.  Left ventricular diastolic parameters  are indeterminate.  2. Right ventricular systolic function is normal. The right ventricular  size is normal. Tricuspid regurgitation signal is inadequate for assessing  PA pressure.  3. The mitral valve is normal in structure. No evidence of  mitral valve  regurgitation. No evidence of mitral stenosis.  4. The aortic valve is tricuspid. Aortic valve regurgitation is not  visualized. No aortic stenosis is present.   FINDINGS  Left Ventricle: Left ventricular ejection fraction, by estimation, is 55  to 60%. The left ventricle has normal function. The left ventricle has no  regional wall motion abnormalities. The left ventricular internal cavity  size was normal in size. There is  mild left ventricular hypertrophy. Left ventricular diastolic parameters  are indeterminate.   Right Ventricle: The  right ventricular size is normal. No increase in  right ventricular wall thickness. Right ventricular systolic function is  normal. Tricuspid regurgitation signal is inadequate for assessing PA  pressure. The tricuspid regurgitant  velocity is 1.34 m/s, and with an assumed right atrial pressure of 3 mmHg,  the estimated right ventricular systolic pressure is [id number].   Left Atrium: Left atrial size was normal in size.   Right Atrium: Right atrial size was normal in size.   Pericardium: There is no evidence of pericardial effusion.   Mitral Valve: The mitral valve is normal in structure. No evidence of  mitral valve regurgitation. No evidence of mitral valve stenosis.   Tricuspid Valve: The tricuspid valve is normal in structure. Tricuspid  valve regurgitation is trivial.   Aortic Valve: The aortic valve is tricuspid. Aortic valve regurgitation is  not visualized. No aortic stenosis is present.   Pulmonic Valve: The pulmonic valve was not well visualized. Pulmonic valve  regurgitation is not visualized.   Aorta: The aortic root and ascending aorta are structurally normal, with  no evidence of dilitation.   IAS/Shunts: The interatrial septum was not well visualized.     LEFT VENTRICLE  PLAX 2D  LVIDd:     4.80 cm   Diastology  LVIDs:     3.20 cm   LV e' medial:  6.65 cm/s  LV PW:     0.90 cm   LV E/e' medial: 14.7  LV IVS:    1.00 cm   LV e' lateral:  7.38 cm/s  LVOT diam:   2.00 cm   LV E/e' lateral: 13.2  LV SV:     72  LV SV Index:  36  LVOT Area:   3.14 cm                 3D Volume EF:  LV Volumes (MOD)      3D EF:    58 %  LV vol d, MOD A2C: 93.1 ml LV EDV:    174 ml  LV vol d, MOD A4C: 120.0 ml LV ESV:    73 ml  LV vol s, MOD A2C: 33.6 ml LV SV:    101 ml  LV vol s, MOD A4C: 49.0 ml  LV SV MOD A2C:   59.5 ml  LV SV MOD A4C:   120.0 ml  LV SV MOD BP:   64.5 ml   RIGHT  VENTRICLE  RV S prime:   13.50 cm/s  TAPSE (M-mode): 3.0 cm   LEFT ATRIUM       Index    RIGHT ATRIUM     Index  LA diam:    4.10 cm 2.05 cm/m RA Area:   9.25 cm  LA Vol (A2C):  47.0 ml 23.50 ml/m RA Volume:  14.40 ml 7.20 ml/m  LA Vol (A4C):  50.8 ml 25.40 ml/m  LA Biplane Vol: 51.1 ml 25.55 ml/m  AORTIC VALVE  LVOT Vmax:  112.00 cm/s  LVOT Vmean: 79.200 cm/s  LVOT VTI:  0.229 m    AORTA  Ao Root diam: 2.70 cm  Ao Asc diam: 2.90 cm   MITRAL VALVE        TRICUSPID VALVE  MV Area (PHT): 4.68 cm  TR Peak grad:  7.2 mmHg  MV Decel Time: 162 msec  TR Vmax:    134.00 cm/s  MV E velocity: 97.70 cm/s  MV A velocity: 75.80 cm/s SHUNTS  MV E/A ratio: 1.29    Systemic VTI: 0.23 m               Systemic Diam: 2.00 cm _____   History of Present Illness     Katelyn [name] is a 64 y.o. female with a hx of mild CAD by cath 2015 (50% mid LCx at [facility]), chronic LBBB, hypertensive heart disease, CKD stage IIIa, arthritis, anemia, anxiety, depression, HTN, HLD, lumbar stenosis, OSA (uses [place]/intolerant of CPAP), DM, morbid obesity presented to ER 10/04/20 with DOE over last several months that has increased until now she has to stop and rest.  She has associated chest pain with this with some in her arms at times.  Prior to admit woke with chest pain at 3 AM. She took 325 mg asa without relief and NTG without relief.  EMS called and 2 sl NTG with some chest pain improvement.  On EKG LBBB SR but LBBB is chronic    BNP normal troponin normal ddimer normal COVID neg. She is IDDM and has poor control of DM.  Chronic anemia and stable.    Hospital Course     Consultants: pulmonary for hypoxia and OSA   Pt's troponin 11.  X 3.  She did undergo cardiac cath 10/05/20 with non obstructive disease.  She had mild pulmonary hypertension.  Echo with normal EF.   Mild LVH.    Hypoxia/OSA--At night she de sated to 83% and with  ambulation de sat to 86%.  Pulmonary consult obtained and they discussed her severe OSA and that if she could not tolerate BiPap then a trach could be an option.  She agreed to try Bipap.  She will follow with pulmonary. They did not believe ENT would help at this point.   She had neg ddimer and neg VQ scan.   Pulmonary HTN and diastolic CHF have resumed her torsemide at discharge. Will have her take twice a day for 3 days then back to once per day.    CKD 3a Cr at discharge 1.01 follow as outpt   Morbid obesity is an issue, she did not want nutrition consult here but she has information to reach out to [employer] [facility] in [place] for appt.    Weak legs, to have follow up with neurosurgeon Dr. Yvette Rack - he has done her surgeries in the past.  I contacted their office and they will contact her, I asked her to call them as well  Hx of esophageal dilatation and now difficulty swallowing solid foods. Also complains of feeling full with gas.  Have asked her to see PCP for possible referral back to her GI physician    IDDM poorly controlled- A1C of 9.5  have asked pt to call Dr. Baldwin Crown office for appt.  I left a message with their office as well.  Here we added lantus and meal coverage while off 70/30 for procedures    Hx of anemia with stable HGB. No bleeding.  Hypertensive heart  disease BP stable and now back on torsemide.   HLD on statin, she has had myalgias in past on lipitor so will try crestor. Will need to check Hepatic and lipids in 8 weeks or so.    Foul smelling urine have sent u/a + UTI with + nitrites, glucose > 500 as well. Septa added will need to follow up with PCP.  Pt has been seen by Dr. Percival Spanish and found ready for discharge.  She has home oxygen in the room with her for discharge.   Did the patient have an acute coronary syndrome (MI, NSTEMI, STEMI, etc) this admission?:  No                               Did the patient have a percutaneous coronary  intervention (stent / angioplasty)?:  No.       _____________  Discharge Vitals Blood pressure 140/64, pulse 73, temperature 98.2 F (36.8 C), temperature source Oral, resp. rate 17, height 5\' 2"  (1.575 m), weight 100.1 kg, SpO2 95 %.  Filed Weights   10/05/20 0201 10/06/20 0406 10/07/20 0610  Weight: 99.9 kg 100.9 kg 100.1 kg    Labs & Radiologic Studies    CBC Recent Labs    10/04/20 1555 10/05/20 0904 10/05/20 1456 10/05/20 1504 10/06/20 0105  WBC 5.8 5.6  --   --  4.9  NEUTROABS 3.6  --   --   --   --   HGB 11.8* 11.8*   < > 11.9* 11.5*  HCT 34.8* 34.1*   < > 35.0* 34.7*  MCV 100.0 100.6*  --   --  101.5*  PLT 164 164  --   --  144*   < > = values in this interval not displayed.   Basic Metabolic Panel Recent Labs    10/06/20 0105 10/07/20 0116  NA 135 133*  K 3.6 4.3  CL 99 98  CO2 26 25  GLUCOSE 280* 294*  BUN 18 18  CREATININE 1.13* 1.01*  CALCIUM 8.4* 8.7*   Liver Function Tests No results for input(s): AST, ALT, ALKPHOS, BILITOT, PROT, ALBUMIN in the last 72 hours. No results for input(s): LIPASE, AMYLASE in the last 72 hours. High Sensitivity Troponin:   Recent Labs  Lab 10/04/20 1555 10/04/20 1842  TROPONINIHS 11 11    BNP Invalid input(s): POCBNP D-Dimer Recent Labs    10/04/20 1555  DDIMER 0.30   Hemoglobin A1C Recent Labs    10/05/20 0228  HGBA1C 9.5*   Fasting Lipid Panel Recent Labs    10/05/20 0228  CHOL 184  HDL 44  LDLCALC 114*  TRIG 130  CHOLHDL 4.2   Thyroid Function Tests No results for input(s): TSH, T4TOTAL, T3FREE, THYROIDAB in the last 72 hours.  Invalid input(s): FREET3 _____________  DG Chest 1 View  Result Date: 10/06/2020 CLINICAL DATA:  Chest x-ray for use with nuclear medicine PE study. EXAM: CHEST  1 VIEW COMPARISON:  DECEMBER 27, 21. FINDINGS: The heart size and mediastinal contours are within normal limits. Both lungs are clear. No visible pleural effusions or pneumothorax. No acute osseous  abnormality. IMPRESSION: No acute cardiopulmonary disease. Electronically Signed   By: Margaretha Sheffield MD   On: 10/06/2020 11:52   NM Pulmonary Perfusion  Result Date: 10/06/2020 CLINICAL DATA:  Pulmonary arterial hypertension with elevated right heart pressure. EXAM: NUCLEAR MEDICINE PERFUSION LUNG SCAN TECHNIQUE: Perfusion images were obtained in multiple projections after  intravenous injection of radiopharmaceutical. Views: Anterior, posterior, left lateral, right lateral, RPO, LPO, RAO, LAO. RADIOPHARMACEUTICALS:  4.4 mCi Tc-22m MAA IV COMPARISON:  Chest radiograph October 04, 2020 FINDINGS: Radiotracer uptake is homogeneous and symmetric bilaterally. No perfusion defects evident. IMPRESSION: No evident perfusion defects. No findings indicative of pulmonary embolus. Electronically Signed   By: Lowella Grip III M.D.   On: 10/06/2020 12:55   CARDIAC CATHETERIZATION  Result Date: 10/06/2020  Prox LAD lesion is 30% stenosed.  1st Diag lesion is 55% stenosed.  Dist Cx lesion is 40% stenosed.  Prox RCA lesion is 25% stenosed.  Dist RCA lesion is 25% stenosed.  There is mild left ventricular systolic dysfunction.  LV end diastolic pressure is moderately elevated.  The left ventricular ejection fraction is 50-55% by visual estimate.  Hemodynamic findings consistent with mild pulmonary hypertension.  1. Nonobstructive CAD. Codominant circulation 2. Low normal LV systolic function. EF estimated at 50%. 3. Elevated LV filling pressures - PCWP 22 mm Hg, EDP 25 mm Hg 4. Mild pulmonary HTN with mean PAP 27 mm Hg 5. Elevated RA pressures 16 mm Hg 6. Reduced cardiac output with index 2.5 Plan: medical management for CAD. Need to repeat Echo to assess LV and RV function. May need to consider pulmonary embolic disease given pulmonary HTN and elevated RA pressures. This may reflect elevated Left heart pressures. Morbid obesity and OSA may also be contributing.   DG Chest Portable 1 View  Result Date:  10/04/2020 CLINICAL DATA:  Chest pain, dyspnea. EXAM: PORTABLE CHEST 1 VIEW COMPARISON:  06/13/2019 and prior. FINDINGS: Mild hypoinflation. Linear right basilar opacity, likely atelectasis versus superimposition artifact. No pneumothorax or pleural effusion. Cardiomediastinal silhouette is within normal limits. Multilevel spondylosis. Partially imaged spinal fusion hardware. IMPRESSION: No focal airspace disease. Electronically Signed   By: Primitivo Gauze M.D.   On: 10/04/2020 16:12   ECHOCARDIOGRAM COMPLETE  Result Date: 10/06/2020    ECHOCARDIOGRAM REPORT   Patient Name:   Katelyn [name] Date of Exam: 10/06/2020 Medical Rec #:  [id number]   Height:       62.0 in Accession #:    [id number]  Weight:       222.4 lb Date of Birth:  06/04/1956   BSA:          2.000 m Patient Age:    89 years    BP:           145/73 mmHg Patient Gender: F           HR:           77 bpm. Exam Location:  Inpatient Procedure: 2D Echo, 3D Echo, Color Doppler, Cardiac Doppler and Strain Analysis Indications:    Pulmonary hypertension 416.8 / I27.2  History:        Patient has prior history of Echocardiogram examinations, most                 recent 04/28/2019. CAD, Arrythmias:LBBB; Risk Factors:Sleep                 Apnea, Hypertension, Diabetes and Dyslipidemia. GERD.  Sonographer:    Darlina Sicilian RDCS Referring Phys: [id number] PETER M [place] IMPRESSIONS  1. Left ventricular ejection fraction, by estimation, is 55 to 60%. The left ventricle has normal function. The left ventricle has no regional wall motion abnormalities. There is mild left ventricular hypertrophy. Left ventricular diastolic parameters are indeterminate.  2. Right ventricular systolic function is normal. The right ventricular size is normal.  Tricuspid regurgitation signal is inadequate for assessing PA pressure.  3. The mitral valve is normal in structure. No evidence of mitral valve regurgitation. No evidence of mitral stenosis.  4. The aortic valve is tricuspid.  Aortic valve regurgitation is not visualized. No aortic stenosis is present. FINDINGS  Left Ventricle: Left ventricular ejection fraction, by estimation, is 55 to 60%. The left ventricle has normal function. The left ventricle has no regional wall motion abnormalities. The left ventricular internal cavity size was normal in size. There is  mild left ventricular hypertrophy. Left ventricular diastolic parameters are indeterminate. Right Ventricle: The right ventricular size is normal. No increase in right ventricular wall thickness. Right ventricular systolic function is normal. Tricuspid regurgitation signal is inadequate for assessing PA pressure. The tricuspid regurgitant velocity is 1.34 m/s, and with an assumed right atrial pressure of 3 mmHg, the estimated right ventricular systolic pressure is [id number] mmHg. Left Atrium: Left atrial size was normal in size. Right Atrium: Right atrial size was normal in size. Pericardium: There is no evidence of pericardial effusion. Mitral Valve: The mitral valve is normal in structure. No evidence of mitral valve regurgitation. No evidence of mitral valve stenosis. Tricuspid Valve: The tricuspid valve is normal in structure. Tricuspid valve regurgitation is trivial. Aortic Valve: The aortic valve is tricuspid. Aortic valve regurgitation is not visualized. No aortic stenosis is present. Pulmonic Valve: The pulmonic valve was not well visualized. Pulmonic valve regurgitation is not visualized. Aorta: The aortic root and ascending aorta are structurally normal, with no evidence of dilitation. IAS/Shunts: The interatrial septum was not well visualized.  LEFT VENTRICLE PLAX 2D LVIDd:         4.80 cm      Diastology LVIDs:         3.20 cm      LV e' medial:    6.65 cm/s LV PW:         0.90 cm      LV E/e' medial:  14.7 LV IVS:        1.00 cm      LV e' lateral:   7.38 cm/s LVOT diam:     2.00 cm      LV E/e' lateral: 13.2 LV SV:         72 LV SV Index:   36 LVOT Area:     3.14 cm                               3D Volume EF: LV Volumes (MOD)            3D EF:        58 % LV vol d, MOD A2C: 93.1 ml  LV EDV:       174 ml LV vol d, MOD A4C: 120.0 ml LV ESV:       73 ml LV vol s, MOD A2C: 33.6 ml  LV SV:        101 ml LV vol s, MOD A4C: 49.0 ml LV SV MOD A2C:     59.5 ml LV SV MOD A4C:     120.0 ml LV SV MOD BP:      64.5 ml RIGHT VENTRICLE RV S prime:     13.50 cm/s TAPSE (M-mode): 3.0 cm LEFT ATRIUM             Index       RIGHT ATRIUM  Index LA diam:        4.10 cm 2.05 cm/m  RA Area:     9.25 cm LA Vol (A2C):   47.0 ml 23.50 ml/m RA Volume:   14.40 ml 7.20 ml/m LA Vol (A4C):   50.8 ml 25.40 ml/m LA Biplane Vol: 51.1 ml 25.55 ml/m  AORTIC VALVE LVOT Vmax:   112.00 cm/s LVOT Vmean:  79.200 cm/s LVOT VTI:    0.229 m  AORTA Ao Root diam: 2.70 cm Ao Asc diam:  2.90 cm MITRAL VALVE               TRICUSPID VALVE MV Area (PHT): 4.68 cm    TR Peak grad:   7.2 mmHg MV Decel Time: 162 msec    TR Vmax:        134.00 cm/s MV E velocity: 97.70 cm/s MV A velocity: 75.80 cm/s  SHUNTS MV E/A ratio:  1.29        Systemic VTI:  0.23 m                            Systemic Diam: 2.00 cm Epifanio Lesches MD Electronically signed by Epifanio Lesches MD Signature Date/Time: 10/06/2020/5:17:16 PM    Final    Disposition   Pt is being discharged home today in good condition.  Follow-up Plans & Appointments   Call [facility] at [phone] if any bleeding, swelling or drainage at cath site.  May shower, no tub baths for 48 hours for groin sticks. No lifting over 5 pounds for 3 days.  No Driving for 3 days  Increase your Torsemide to twice a day for 3 days then back to once per day.   Wear your oxygen with ambulation and at night though you should use the BiPap instead.   The BiPAP is best for you   Heart Healthy Diabetic Diet - look up DASH Diabetic Diet.  Until you are seen by [facility].  Then use their diet.  Please call Dr. Yetta Barre and Dr. Evlyn Kanner  for appointments.  They are waiting for your call.  VERY important to control diabetes.  Follow up with your primary MD and discuss the urinary tract infection  And the swallowing and gas issues  We added antibiotic for urinary tract infection.  Take antibiotic for 5 days.     Follow-up Information    Tia Alert, MD Follow up.   Specialty: Neurosurgery Why: call his office for appt.  I have called as well Contact information: [address] [place] [address] [phone]        Corinna Capra A, DO Follow up.   Specialty: Bariatrics Why: this is the [facility] office, Dr. Manson Passey is one of the providers.  please call the week on 10/12/19 to get appt with wt loss team.  They will need your insurance information when you call. It you need referral please call Dr. Ezzard Flax Contact information: [phone] [place] [address] [phone]        Olive Bass, MD Follow up.   Specialty: Family Medicine Why: make appt in next 1-2 weeks  may need furter eval with gastroenterology MD and ENT Contact information: [address] [place] [address] [phone]        Baldo Daub, MD Follow up on 10/22/2020.   Specialty: Cardiology Why: at 3:00 pm in  [place] Contact information: [place] [address] [phone]        Reynold Bowen, MD Follow up.   Specialty: Endocrinology Contact information: [place] [address] [phone]        Chesley Mires, MD Follow up.   Specialty: Pulmonary Disease Why: Office will call you with an appointment. If you do not hear from them in one week, please call to schedule an appointment with Dr. [name] for sleep.   Contact information: [address] [place] [address] [phone]        [facility] Follow up.   Specialty: [place] Why: for home health RN Contact information: [address] [place] [address] [phone]        [facility] Follow up.   Why: For home oxygen Contact information: [address] [place] [address] [phone]                Discharge Medications   Allergies as of 10/07/2020      Reactions   Atorvastatin Nausea And Vomiting, Other (See Comments)   MYALGIAS   Talwin [pentazocine] Other (See Comments)   headache   Other Nausea Only   UNSPECIFIED Anesthesia      Medication List    TAKE these medications   acetaminophen 325 MG tablet Commonly known as: TYLENOL Take 2 tablets (650 mg total) by mouth every 4 (four) hours as needed for headache or mild pain.   allopurinol 100 MG tablet Commonly known as: ZYLOPRIM Take 100 mg by mouth at bedtime.   aspirin EC 81 MG tablet Take 81 mg by mouth at bedtime. Swallow whole.   carvedilol 6.25 MG tablet Commonly known as: COREG Take 6.25 mg by mouth 2 (two) times daily.   DULoxetine 60 MG capsule Commonly known as: CYMBALTA Take 60 mg by mouth at bedtime.   GLUCOSAMINE-CHONDROITIN PO Take 1 tablet by mouth 2 (two) times daily.   insulin NPH-regular Human (70-30) 100 UNIT/ML injection Inject 35-100 Units into the skin See admin instructions. Inject 35-100 units subcutaneously four times daily - before meals and at bedtime per sliding scale: CBG 150 35 units, for every 10 points over 150 add 1 unit insulin.   mirtazapine 15 MG tablet Commonly known as: REMERON Take 15 mg by mouth at bedtime.   multivitamin with minerals Tabs tablet Take 1 tablet by mouth daily. Centrum Silver Women   nitroGLYCERIN 0.4 MG SL tablet Commonly known as: NITROSTAT Place 0.4 mg under the tongue every 5 (five) minutes as needed for chest pain.   OneTouch Verio test strip Generic drug: glucose blood USE 1 STRIP TO CHECK GLUCOSE 4 TIMES DAILY AS DIRECTED   pantoprazole 40 MG tablet Commonly known as: PROTONIX Take 1 tablet by mouth  once daily   pramipexole 1 MG tablet Commonly known as: MIRAPEX Take 1 mg by mouth at bedtime.   pregabalin 50 MG capsule Commonly known as: LYRICA Take 50 mg by mouth at bedtime.   rosuvastatin 10 MG tablet Commonly known as: CRESTOR Take 1 tablet (10 mg total) by mouth daily. Start taking on: October 08, 2020   sulfamethoxazole-trimethoprim 800-160 MG tablet Commonly known as: BACTRIM DS Take 1 tablet by mouth every 12 (twelve) hours. Until gone   torsemide 20 MG tablet Commonly known as: DEMADEX TAKE 1 TABLET BY MOUTH TWICE DAILY. TAKE ONCE A DAY FOR WEIGHT <200 LBS What changed: See  the new instructions.   TURMERIC PO Take 1,000 mg by mouth daily.   valACYclovir 1000 MG tablet Commonly known as: VALTREX Take 1,000 mg by mouth See admin instructions. Take one tablet (1000 mg) by mouth twice daily for 2 days, then take one tablet (1000 mg) daily for 2 days - as needed for fever blisters            Durable Medical Equipment  (From admission, onward)         Start     Ordered   10/06/20 1654  For home use only DME oxygen  Once       Question Answer Comment  Length of Need 6 Months   Mode or (Route) Nasal cannula   Liters per Minute 2   Frequency Continuous (stationary and portable oxygen unit needed)   Oxygen conserving device Yes   Oxygen delivery system Gas      10/06/20 1654             Outstanding Labs/Studies   BMP on visit and in 8 weeks or so hepatic and lipids  Duration of Discharge Encounter   Greater than 30 minutes including physician time.  Signed, Cecilie Kicks, NP 10/07/2020, 10:41 AM  Patient seen and examined.  Plan as discussed in my rounding note for today and outlined above. Jeneen Rinks Rihaan Barrack  10/07/2020  1:30 PM

## 2020-10-07 NOTE — Progress Notes (Signed)
Patient given discharge instructions. Husband present.  Telemetry removed, CCMD notified. IV removed, clean dry and intact. Pt taken to vehicle in wheelchair. 2 oxygen tanks taken to vehicle with patient.   Kenard Gower, RN

## 2020-10-07 NOTE — Discharge Instructions (Signed)
Call [facility] at [phone] if any bleeding, swelling or drainage at cath site.  May shower, no tub baths for 48 hours for groin sticks. No lifting over 5 pounds for 3 days.  No Driving for 3 days  Increase your Torsemide to twice a day for 3 days then back to once per day.   Wear your oxygen with ambulation and at night though you should use the BiPap instead.   The BiPAP is best for you   Heart Healthy Diabetic Diet - look up DASH Diabetic Diet.  Until you are seen by [facility] weight [facility] wellness.  Then use their diet.  Please call Dr. Yetta Barre and Dr. Evlyn Kanner for appointments.  They are waiting for your call.  VERY important to control diabetes.  Follow up with your primary MD and discuss the urinary tract infection  And the swallowing and gas issues  We added antibiotic for urinary tract infection.  Take antibiotic for 5 days.    Think of planning 2022 as the year you work toward a healthier you.

## 2020-10-08 DIAGNOSIS — I5032 Chronic diastolic (congestive) heart failure: Secondary | ICD-10-CM | POA: Diagnosis not present

## 2020-10-08 DIAGNOSIS — R0602 Shortness of breath: Secondary | ICD-10-CM | POA: Diagnosis not present

## 2020-10-08 DIAGNOSIS — J984 Other disorders of lung: Secondary | ICD-10-CM | POA: Diagnosis not present

## 2020-10-11 ENCOUNTER — Telehealth: Payer: Self-pay | Admitting: *Deleted

## 2020-10-11 DIAGNOSIS — R29898 Other symptoms and signs involving the musculoskeletal system: Secondary | ICD-10-CM

## 2020-10-11 NOTE — Telephone Encounter (Signed)
-----   Message from Leone Brand, NP sent at 10/11/2020  1:17 PM EST ----- Pt needs referral sent to Dr. Marikay Alar neurosurgery today please for lower ext leg weakness per Dr. Antoine Poche.  She has appt already they just need a referral.  Thanks.

## 2020-10-12 ENCOUNTER — Inpatient Hospital Stay: Payer: PPO | Admitting: Pulmonary Disease

## 2020-10-18 DIAGNOSIS — T8859XA Other complications of anesthesia, initial encounter: Secondary | ICD-10-CM | POA: Insufficient documentation

## 2020-10-18 DIAGNOSIS — N1832 Chronic kidney disease, stage 3b: Secondary | ICD-10-CM | POA: Insufficient documentation

## 2020-10-18 DIAGNOSIS — Z8719 Personal history of other diseases of the digestive system: Secondary | ICD-10-CM | POA: Insufficient documentation

## 2020-10-18 DIAGNOSIS — N183 Chronic kidney disease, stage 3 unspecified: Secondary | ICD-10-CM | POA: Insufficient documentation

## 2020-10-18 DIAGNOSIS — N189 Chronic kidney disease, unspecified: Secondary | ICD-10-CM | POA: Insufficient documentation

## 2020-10-18 DIAGNOSIS — M48061 Spinal stenosis, lumbar region without neurogenic claudication: Secondary | ICD-10-CM | POA: Insufficient documentation

## 2020-10-18 DIAGNOSIS — I2510 Atherosclerotic heart disease of native coronary artery without angina pectoris: Secondary | ICD-10-CM | POA: Insufficient documentation

## 2020-10-18 DIAGNOSIS — R0600 Dyspnea, unspecified: Secondary | ICD-10-CM | POA: Insufficient documentation

## 2020-10-18 DIAGNOSIS — Z87442 Personal history of urinary calculi: Secondary | ICD-10-CM | POA: Insufficient documentation

## 2020-10-18 DIAGNOSIS — R011 Cardiac murmur, unspecified: Secondary | ICD-10-CM | POA: Insufficient documentation

## 2020-10-18 DIAGNOSIS — F32A Depression, unspecified: Secondary | ICD-10-CM | POA: Insufficient documentation

## 2020-10-18 DIAGNOSIS — E119 Type 2 diabetes mellitus without complications: Secondary | ICD-10-CM | POA: Insufficient documentation

## 2020-10-18 DIAGNOSIS — G629 Polyneuropathy, unspecified: Secondary | ICD-10-CM | POA: Insufficient documentation

## 2020-10-18 DIAGNOSIS — I10 Essential (primary) hypertension: Secondary | ICD-10-CM | POA: Insufficient documentation

## 2020-10-18 DIAGNOSIS — R112 Nausea with vomiting, unspecified: Secondary | ICD-10-CM | POA: Insufficient documentation

## 2020-10-18 DIAGNOSIS — I447 Left bundle-branch block, unspecified: Secondary | ICD-10-CM | POA: Insufficient documentation

## 2020-10-18 DIAGNOSIS — E7800 Pure hypercholesterolemia, unspecified: Secondary | ICD-10-CM | POA: Insufficient documentation

## 2020-10-18 DIAGNOSIS — M7071 Other bursitis of hip, right hip: Secondary | ICD-10-CM | POA: Insufficient documentation

## 2020-10-18 DIAGNOSIS — M1990 Unspecified osteoarthritis, unspecified site: Secondary | ICD-10-CM | POA: Insufficient documentation

## 2020-10-18 DIAGNOSIS — G2581 Restless legs syndrome: Secondary | ICD-10-CM | POA: Insufficient documentation

## 2020-10-18 DIAGNOSIS — F419 Anxiety disorder, unspecified: Secondary | ICD-10-CM | POA: Insufficient documentation

## 2020-10-18 DIAGNOSIS — Z9889 Other specified postprocedural states: Secondary | ICD-10-CM | POA: Insufficient documentation

## 2020-10-19 DIAGNOSIS — M48062 Spinal stenosis, lumbar region with neurogenic claudication: Secondary | ICD-10-CM | POA: Diagnosis not present

## 2020-10-21 NOTE — Progress Notes (Signed)
Cardiology Office Note:    Date:  10/22/2020   ID:  Katelyn [name], DOB 1956/01/18, MRN [id number]  PCP:  Algis Greenhouse, MD  Cardiologist:  Shirlee More, MD    Referring MD: Algis Greenhouse, MD    ASSESSMENT:    1. Coronary artery disease without angina pectoris, unspecified vessel or lesion type, unspecified whether native or transplanted heart   2. Essential hypertension   3. Chronic diastolic heart failure ([place])   4. OSA (obstructive sleep apnea)    PLAN:    In order of problems listed above:  1. She has stable CAD and recent coronary arteriography continue medical therapy including aspirin beta-blocker and lipid-lowering of the high intensity statin.  She is reassured is not having angina at this time 2. She has hypertensive heart disease with heart failure BP at target I will increase her diuretic with breakthrough edema 20 mg twice a day Monday Wednesday and Friday and continue carvedilol.  If an additional antihypertensive agent is needed ARB would be appropriate 3. Continue nocturnal oxygen await evaluation for sleep apnea at her request I gave her a note for shoulder oxygen canister to facilitate her exercise program 4. Poorly controlled diabetes. 5. She has left bundle branch block with preserved ejection fraction would not benefit from CRT   Next appointment: 3 months   Medication Adjustments/Labs and Tests Ordered: Current medicines are reviewed at length with the patient today.  Concerns regarding medicines are outlined above.  Orders Placed This Encounter  Procedures  . Basic metabolic panel  . Pro b natriuretic peptide (BNP)   Meds ordered this encounter  Medications  . torsemide (DEMADEX) 20 MG tablet    Sig: Take one tablet by mouth once daily on Sunday, Tuesday, Thursday, and Saturday. Then take one tablet by mouth twice daily on Monday, Wednesday, and Friday.    Dispense:  180 tablet    Refill:  1    Chief Complaint  Patient presents with  .  Follow-up  . Coronary Artery Disease  . Congestive Heart Failure    History of Present Illness:    Katelyn [name] is a 65 y.o. female with a hx of hypertension mild CAD dyslipidemia left bundle branch block with an ejection fraction of 55 to 60% restrictive lung disease with shortness of breath due to deconditioning, obesity and statin intolerance  last seen 03/04/2020.  Compliance with diet, lifestyle and medications: Yes  I think her hospitalization helped her she is using nocturnal oxygen awaiting evaluation for CPAP feels dramatically improved and her edema has resolved.  Intermittently she takes an extra dose of diuretic and ongoing to have her take it twice daily on Mondays Wednesdays and Fridays as she does have diastolic heart failure with a phenotype of normal BNP level.  Her diabetes is poorly controlled she is seeing a nutritionist and endocrinology.  She wears oxygen she walks outdoors without shortness of breath no chest pain palpitation or syncope.  She underwent repeat coronary angiography 10/05/2020 which showed mild nonobstructive CAD low normal ejection fraction estimated 50% elevated filling pressure mild elevation of pulmonary artery pressure and right heart pressures. Echocardiogram done at [facility] 10/06/2020 showed EF normal at 50 to 55% mild LVH indeterminate diastolic pressure normal right heart function and no significant valvular abnormality. She had presented to the hospital with shortness of breath and chest pain.  Noted to have nocturnal desaturation down to 83% at night and ambulation 86% and was referred to pulmonary  for her untreated severe sleep apnea.  Is also noted to have a history of esophageal stricture previous dilatation was advised to follow-up with GI as a cause of chest pain.  Her diabetes is poorly controlled A1c of 9.5% she did not have acute coronary syndrome high-sensitivity troponin was normal Independently I reviewed her EKG 10/04/2020 showing  sinus rhythm left bundle branch block Her BNP level is normal in hospital.  She was giving increased dose of torsemide for a few days and then back to her usual dose her oxygen with ambulation at night and referred for evaluation and treatment of sleep apnea poorly controlled diabetes referred to endocrinology. Past Medical History:  Diagnosis Date  . Anxiety   . Arthritis   . Bursitis of right hip   . CAD (coronary artery disease)    Cardiac catheterization June 2014 in [facility] - 50% circumflex stenosis  . Chest pain, neg MI, stable CAD non obstructive on cath 10/05/20 10/04/2020  . Chronic diastolic heart failure ([place]) 08/20/2017  . Chronic kidney disease, stage 3 (HCC)    does not see nephrologist  . Chronic low back pain without sciatica 03/14/2016  . CKD (chronic kidney disease), stage III ([place]) 10/07/2020  . Complication of anesthesia   . Cough 04/19/2017   Overview:  Last Assessment & Plan:  Formatting of this note may be different from the original. Cough - ? ACE related with AR triggers   Plan  Patient Instructions  Discuss with your primary doctor that lisinopril pain, need making your cough worse. May use Mucinex DM twice daily as needed for cough and congestion Zyrtec 10 mg at bedtime as needed for drainage Saline nasal spray as needed. Lab tests today Activity as tolerated. Follow with Dr. [name] in 3-4 months and As needed   Please contact office for sooner follow up if symptoms do not improve or worsen or seek emergency care   . Depression   . Dyspnea    with exertion   " lazy lung" - per  [name] from back issues- 06/2016  . Elevated liver enzymes 12/05/2016  . Essential hypertension   . GERD (gastroesophageal reflux disease)   . Gout 03/14/2016  . Greater trochanteric bursitis of right hip 02/02/2012  . H/O hiatal hernia   . Heart murmur   . History of blood transfusion 2016  . History of esophageal stricture 10/07/2020  . History of kidney stones   . Hypercholesterolemia    . Hypertensive heart disease with heart failure ([place]) 01/01/2017  . Hypoxia 10/07/2020  . Iliotibial band syndrome of right side 02/02/2012  . Iron deficiency anemia due to chronic blood loss 03/14/2016  . LBBB (left bundle branch block) 01/01/2017  . Left bundle branch block   . Leg weakness 10/07/2020  . Lumbar stenosis   . Meralgia paraesthetica 12/05/2016  . Mild CAD 11/24/2015  . Morbid obesity ([place]) 10/07/2020  . Neuropathy   . OSA (obstructive sleep apnea) 05/24/2016   Overview:  Managed PULM  . PONV (postoperative nausea and vomiting)    "no N/V with patch"  . Restless leg syndrome   . S/P lumbar laminectomy 11/26/2015  . S/P lumbar spinal fusion 08/29/2016  . Tinnitus 12/05/2016  . Type 2 diabetes mellitus ([place])   . UTI (urinary tract infection) 10/07/2020    Past Surgical History:  Procedure Laterality Date  . ABDOMINAL HYSTERECTOMY  1983  . APPENDECTOMY  Age 11  . BACK SURGERY     FIRST LUMBAR FUSION/  SURGERY APRIL 2012 AND FUSION WITH INSTRUMENTATION SEPT 2012  . CARDIAC CATHETERIZATION     x 2  . CARPAL TUNNEL RELEASE     bil  . CHOLECYSTECTOMY  1990's  . CYSTO EXTRACTION KIDNEY STONES    . EXCISION/RELEASE BURSA HIP  02/02/2012   Procedure: EXCISION/RELEASE BURSA HIP;  Surgeon: Tobi Bastos, MD;  Location: [facility];  Service: Orthopedics;  Laterality: Right;  Right Hip Bursectomy  . EYE SURGERY Bilateral    cataracts  . KIDNEY STONE SURGERY  2008  . Left knee surgery x 2  1996   reconstruction  . LUMBAR DISC SURGERY  08/2016  . LUMBAR LAMINECTOMY/DECOMPRESSION MICRODISCECTOMY Right 11/26/2015   Procedure: Extraforaminal Microdiscectomy  - Lumbar two-three- right;  Surgeon: Eustace Moore, MD;  Location: [place] [facility] OR[facility];  Service: Neurosurgery;  Laterality: Right;  right   . LUMBAR WOUND DEBRIDEMENT N/A 10/25/2016   Procedure: Lumbar wound revision;  Surgeon: Eustace Moore, MD;  Location: [place];  Service: Neurosurgery;  Laterality: N/A;  Lumbar wound revision  . Right  shoulder surgery  2010   spur  . RIGHT/LEFT HEART CATH AND CORONARY ANGIOGRAPHY N/A 10/05/2020   Procedure: RIGHT/LEFT HEART CATH AND CORONARY ANGIOGRAPHY;  Surgeon: Martinique, Peter M, MD;  Location: [place] CV LAB;  Service: Cardiovascular;  Laterality: N/A;    Current Medications: Current Meds  Medication Sig  . acetaminophen (TYLENOL) 325 MG tablet Take 2 tablets (650 mg total) by mouth every 4 (four) hours as needed for headache or mild pain.  Marland Kitchen allopurinol (ZYLOPRIM) 100 MG tablet Take 100 mg by mouth at bedtime.  Marland Kitchen aspirin EC 81 MG tablet Take 81 mg by mouth at bedtime. Swallow whole.  . carvedilol (COREG) 6.25 MG tablet Take 6.25 mg by mouth 2 (two) times daily.   . DULoxetine (CYMBALTA) 60 MG capsule Take 60 mg by mouth at bedtime.   Marland Kitchen GLUCOSAMINE-CHONDROITIN PO Take 1 tablet by mouth 2 (two) times daily.  . insulin NPH-regular Human (NOVOLIN 70/30) (70-30) 100 UNIT/ML injection Inject 35-100 Units into the skin See admin instructions. Inject 35-100 units subcutaneously four times daily - before meals and at bedtime per sliding scale: CBG 150 35 units, for every 10 points over 150 add 1 unit insulin.  . mirtazapine (REMERON) 15 MG tablet Take 15 mg by mouth at bedtime.  . Multiple Vitamin (MULTIVITAMIN WITH MINERALS) TABS tablet Take 1 tablet by mouth daily. Centrum Silver Women  . nitroGLYCERIN (NITROSTAT) 0.4 MG SL tablet Place 0.4 mg under the tongue every 5 (five) minutes as needed for chest pain.   Glory Rosebush VERIO test strip USE 1 STRIP TO CHECK GLUCOSE 4 TIMES DAILY AS DIRECTED  . pantoprazole (PROTONIX) 40 MG tablet Take 1 tablet by mouth once daily (Patient taking differently: Take 40 mg by mouth daily.)  . pramipexole (MIRAPEX) 1 MG tablet Take 1 mg by mouth at bedtime.  . pregabalin (LYRICA) 50 MG capsule Take 50 mg by mouth at bedtime.  . rosuvastatin (CRESTOR) 10 MG tablet Take 1 tablet (10 mg total) by mouth daily.  . TURMERIC PO Take 1,000 mg by mouth daily.  .  valACYclovir (VALTREX) 1000 MG tablet Take 1,000 mg by mouth See admin instructions. Take one tablet (1000 mg) by mouth twice daily for 2 days, then take one tablet (1000 mg) daily for 2 days - as needed for fever blisters  . [DISCONTINUED] torsemide (DEMADEX) 20 MG tablet TAKE 1 TABLET BY MOUTH TWICE DAILY. TAKE ONCE  A DAY FOR WEIGHT <200 LBS (Patient taking differently: Take 20 mg by mouth See admin instructions. Take one tablet (20 mg) by mouth every morning, may take an additional tablet (20 mg) as needed for feet/ankle swelling)   Current Facility-Administered Medications for the 10/22/20 encounter (Office Visit) with Richardo Priest, MD  Medication  . triamcinolone acetonide (KENALOG) 10 MG/ML injection 10 mg  . triamcinolone acetonide (KENALOG) 10 MG/ML injection 10 mg     Allergies:   Atorvastatin, Talwin [pentazocine], and Other   Social History   Socioeconomic History  . Marital status: Married    Spouse name: Not on file  . Number of children: Not on file  . Years of education: Not on file  . Highest education level: Not on file  Occupational History  . Occupation: Archivist  Tobacco Use  . Smoking status: Never Smoker  . Smokeless tobacco: Never Used  . Tobacco comment: Prior secondhand smoke  Vaping Use  . Vaping Use: Never used  Substance and Sexual Activity  . Alcohol use: Yes    Alcohol/week: 0.0 standard drinks    Comment: Rarely  . Drug use: No  . Sexual activity: Not on file  Other Topics Concern  . Not on file  Social History Narrative  . Not on file   Social Determinants of Health   Financial Resource Strain: Not on file  Food Insecurity: Not on file  Transportation Needs: Not on file  Physical Activity: Not on file  Stress: Not on file  Social Connections: Not on file     Family History: The patient's family history includes Asthma in her sister; Bone cancer in her sister; Heart failure in her mother; Hypertension in her father;  Lung cancer in her father; Stroke in her father. ROS:   Please see the history of present illness.    All other systems reviewed and are negative.  EKGs/Labs/Other Studies Reviewed:    The following studies were reviewed today:   Recent Labs: 10/04/2020: B Natriuretic Peptide 32.9 10/06/2020: Hemoglobin 11.5; Platelets 144 10/07/2020: BUN 18; Creatinine, Ser 1.01; Potassium 4.3; Sodium 133  Recent Lipid Panel    Component Value Date/Time   CHOL 184 10/05/2020 0228   TRIG 130 10/05/2020 0228   HDL 44 10/05/2020 0228   CHOLHDL 4.2 10/05/2020 0228   VLDL 26 10/05/2020 0228   LDLCALC 114 (H) 10/05/2020 0228    Physical Exam:    VS:  BP (!) 142/70   Pulse 70   Ht 5\' 2"  (1.575 m)   Wt 218 lb (98.9 kg)   SpO2 97%   BMI 39.87 kg/m     Wt Readings from Last 3 Encounters:  10/22/20 218 lb (98.9 kg)  10/07/20 220 lb 10.9 oz (100.1 kg)  03/04/20 217 lb (98.4 kg)     GEN:  Well nourished, well developed in no acute distress HEENT: Normal NECK: No JVD; No carotid bruits LYMPHATICS: No lymphadenopathy CARDIAC: RRR, no murmurs, rubs, gallops RESPIRATORY:  Clear to auscultation without rales, wheezing or rhonchi  ABDOMEN: Soft, non-tender, non-distended MUSCULOSKELETAL:  No edema; No deformity  SKIN: Warm and dry NEUROLOGIC:  Alert and oriented x 3 PSYCHIATRIC:  Normal affect    Signed, Shirlee More, MD  10/22/2020 2:51 PM    [facility]

## 2020-10-22 ENCOUNTER — Ambulatory Visit: Payer: PPO | Admitting: Cardiology

## 2020-10-22 ENCOUNTER — Encounter: Payer: Self-pay | Admitting: Cardiology

## 2020-10-22 ENCOUNTER — Other Ambulatory Visit: Payer: Self-pay

## 2020-10-22 VITALS — BP 142/70 | HR 70 | Ht 62.0 in | Wt 218.0 lb

## 2020-10-22 DIAGNOSIS — I2510 Atherosclerotic heart disease of native coronary artery without angina pectoris: Secondary | ICD-10-CM

## 2020-10-22 DIAGNOSIS — I5032 Chronic diastolic (congestive) heart failure: Secondary | ICD-10-CM | POA: Diagnosis not present

## 2020-10-22 DIAGNOSIS — G4733 Obstructive sleep apnea (adult) (pediatric): Secondary | ICD-10-CM

## 2020-10-22 DIAGNOSIS — I10 Essential (primary) hypertension: Secondary | ICD-10-CM | POA: Diagnosis not present

## 2020-10-22 MED ORDER — TORSEMIDE 20 MG PO TABS: ORAL_TABLET | ORAL | 1 refills | Status: DC

## 2020-10-22 NOTE — Patient Instructions (Signed)
Medication Instructions:  Your physician has recommended you make the following change in your medication:  INCREASE: Torsemide please take an extra tablet by mouth daily on Monday, Wednesday, and Friday.  *If you need a refill on your cardiac medications before your next appointment, please call your pharmacy*   Lab Work: Your physician recommends that you return for lab work in: TODAY BMP, ProBNP If you have labs (blood work) drawn today and your tests are completely normal, you will receive your results only by: Marland Kitchen MyChart Message (if you have MyChart) OR . A paper copy in the mail If you have any lab test that is abnormal or we need to change your treatment, we will call you to review the results.   Testing/Procedures: None   Follow-Up: At [facility], you and your health needs are our priority.  As part of our continuing mission to provide you with exceptional heart care, we have created designated Provider Care Teams.  These Care Teams include your primary Cardiologist (physician) and Advanced Practice Providers (APPs -  Physician Assistants and Nurse Practitioners) who all work together to provide you with the care you need, when you need it.  We recommend signing up for the patient portal called "MyChart".  Sign up information is provided on this After Visit Summary.  MyChart is used to connect with patients for Virtual Visits (Telemedicine).  Patients are able to view lab/test results, encounter notes, upcoming appointments, etc.  Non-urgent messages can be sent to your provider as well.   To learn more about what you can do with MyChart, go to [email or web address].    Your next appointment:   3 month(s)  The format for your next appointment:   In Person  Provider:   Shirlee More, MD   Other Instructions

## 2020-10-23 LAB — BASIC METABOLIC PANEL
BUN/Creatinine Ratio: 22 (ref 12–28)
BUN: 24 mg/dL (ref 8–27)
CO2: 27 mmol/L (ref 20–29)
Calcium: 10.1 mg/dL (ref 8.7–10.3)
Chloride: 97 mmol/L (ref 96–106)
Creatinine, Ser: 1.09 mg/dL — ABNORMAL HIGH (ref 0.57–1.00)
GFR calc Af Amer: 62 mL/min/{1.73_m2} (ref >59)
GFR calc non Af Amer: 54 mL/min/{1.73_m2} — ABNORMAL LOW (ref >59)
Glucose: 280 mg/dL — ABNORMAL HIGH (ref 65–99)
Potassium: 4.1 mmol/L (ref 3.5–5.2)
Sodium: 139 mmol/L (ref 134–144)

## 2020-10-23 LAB — PRO B NATRIURETIC PEPTIDE: NT-Pro BNP: 51 pg/mL (ref 0–287)

## 2020-10-25 ENCOUNTER — Telehealth: Payer: Self-pay | Admitting: Cardiology

## 2020-10-25 NOTE — Telephone Encounter (Signed)
    Pt said she went to [facility] and the say the prescription for her portable oxygen needs to come from Dr. Joya Gaskins office. They didn't accept the written prescriptions the pt has.

## 2020-10-25 NOTE — Telephone Encounter (Signed)
Pt states that she was told the provider must call with the order.  Called [facility] but states that it must be a prescription and not a call. Please notify patient once the RX has been sent in. Pt is aware that we are out of the office Monday and Tuesday.

## 2020-10-27 DIAGNOSIS — I130 Hypertensive heart and chronic kidney disease with heart failure and stage 1 through stage 4 chronic kidney disease, or unspecified chronic kidney disease: Secondary | ICD-10-CM | POA: Diagnosis not present

## 2020-10-27 DIAGNOSIS — E1140 Type 2 diabetes mellitus with diabetic neuropathy, unspecified: Secondary | ICD-10-CM | POA: Diagnosis not present

## 2020-10-27 DIAGNOSIS — Z794 Long term (current) use of insulin: Secondary | ICD-10-CM | POA: Diagnosis not present

## 2020-10-27 DIAGNOSIS — I2510 Atherosclerotic heart disease of native coronary artery without angina pectoris: Secondary | ICD-10-CM | POA: Diagnosis not present

## 2020-10-27 DIAGNOSIS — N1831 Chronic kidney disease, stage 3a: Secondary | ICD-10-CM | POA: Diagnosis not present

## 2020-10-27 NOTE — Telephone Encounter (Signed)
Spoke to the patient just now and let her know that I have faxed this for her at this time.

## 2020-11-02 DIAGNOSIS — G4733 Obstructive sleep apnea (adult) (pediatric): Secondary | ICD-10-CM | POA: Diagnosis not present

## 2020-11-03 DIAGNOSIS — F419 Anxiety disorder, unspecified: Secondary | ICD-10-CM | POA: Insufficient documentation

## 2020-11-03 DIAGNOSIS — F32A Depression, unspecified: Secondary | ICD-10-CM | POA: Insufficient documentation

## 2020-11-04 DIAGNOSIS — F3340 Major depressive disorder, recurrent, in remission, unspecified: Secondary | ICD-10-CM | POA: Diagnosis not present

## 2020-11-04 DIAGNOSIS — G2581 Restless legs syndrome: Secondary | ICD-10-CM | POA: Diagnosis not present

## 2020-11-08 ENCOUNTER — Telehealth: Payer: Self-pay | Admitting: Cardiology

## 2020-11-08 DIAGNOSIS — R0602 Shortness of breath: Secondary | ICD-10-CM | POA: Diagnosis not present

## 2020-11-08 DIAGNOSIS — J984 Other disorders of lung: Secondary | ICD-10-CM | POA: Diagnosis not present

## 2020-11-08 DIAGNOSIS — I5032 Chronic diastolic (congestive) heart failure: Secondary | ICD-10-CM | POA: Diagnosis not present

## 2020-11-08 NOTE — Telephone Encounter (Signed)
    Pt is calling back, she said she tried calling [facility] 3x today and when she finally get to talk to someone she was told they couldn't find Dr. Joya Gaskins order for her portable oxygen. She would like to speak with Lilia Pro again

## 2020-11-08 NOTE — Telephone Encounter (Signed)
Spoke to the patient just now and she let me know that [facility] has not received the order for the oxygen. I let her know that I will refax this for her once I am back in [place] tomorrow. I also requested that she please give them another call to find out what fax number they would like for this to be faxed to. She states that she will do this and she thanks me for the call back.

## 2020-11-08 NOTE — Telephone Encounter (Signed)
Patient is calling stating that she is following up with an order Dr. Bettina Gavia put in at [facility] for her to receive a portable oxygen tank. Please advise.

## 2020-11-09 DIAGNOSIS — M5417 Radiculopathy, lumbosacral region: Secondary | ICD-10-CM | POA: Diagnosis not present

## 2020-11-09 DIAGNOSIS — M545 Low back pain, unspecified: Secondary | ICD-10-CM | POA: Diagnosis not present

## 2020-11-09 DIAGNOSIS — M48062 Spinal stenosis, lumbar region with neurogenic claudication: Secondary | ICD-10-CM | POA: Diagnosis not present

## 2020-11-09 NOTE — Telephone Encounter (Signed)
Spoke to the patient just now and she let me know that I am resending the order via fax at this time since I am now back in the [place] office.   She did not call to get a specific fax number for me to fax this to.

## 2020-11-24 DIAGNOSIS — I10 Essential (primary) hypertension: Secondary | ICD-10-CM | POA: Diagnosis not present

## 2020-11-24 DIAGNOSIS — E1140 Type 2 diabetes mellitus with diabetic neuropathy, unspecified: Secondary | ICD-10-CM | POA: Diagnosis not present

## 2020-11-24 DIAGNOSIS — I130 Hypertensive heart and chronic kidney disease with heart failure and stage 1 through stage 4 chronic kidney disease, or unspecified chronic kidney disease: Secondary | ICD-10-CM | POA: Diagnosis not present

## 2020-11-24 DIAGNOSIS — E785 Hyperlipidemia, unspecified: Secondary | ICD-10-CM | POA: Diagnosis not present

## 2020-11-24 DIAGNOSIS — N1831 Chronic kidney disease, stage 3a: Secondary | ICD-10-CM | POA: Diagnosis not present

## 2020-11-24 DIAGNOSIS — Z794 Long term (current) use of insulin: Secondary | ICD-10-CM | POA: Diagnosis not present

## 2020-11-24 DIAGNOSIS — I2510 Atherosclerotic heart disease of native coronary artery without angina pectoris: Secondary | ICD-10-CM | POA: Diagnosis not present

## 2020-11-25 DIAGNOSIS — M961 Postlaminectomy syndrome, not elsewhere classified: Secondary | ICD-10-CM | POA: Insufficient documentation

## 2020-11-25 DIAGNOSIS — M47816 Spondylosis without myelopathy or radiculopathy, lumbar region: Secondary | ICD-10-CM | POA: Diagnosis not present

## 2020-11-25 DIAGNOSIS — F411 Generalized anxiety disorder: Secondary | ICD-10-CM | POA: Insufficient documentation

## 2020-11-25 DIAGNOSIS — M48062 Spinal stenosis, lumbar region with neurogenic claudication: Secondary | ICD-10-CM | POA: Diagnosis not present

## 2020-11-25 DIAGNOSIS — M5417 Radiculopathy, lumbosacral region: Secondary | ICD-10-CM | POA: Diagnosis not present

## 2020-11-29 ENCOUNTER — Other Ambulatory Visit: Payer: Self-pay

## 2020-11-29 ENCOUNTER — Ambulatory Visit: Payer: PPO | Admitting: Pulmonary Disease

## 2020-11-29 ENCOUNTER — Encounter: Payer: Self-pay | Admitting: Pulmonary Disease

## 2020-11-29 VITALS — BP 132/66 | HR 72 | Temp 98.0°F | Ht 62.0 in | Wt 218.0 lb

## 2020-11-29 DIAGNOSIS — G4734 Idiopathic sleep related nonobstructive alveolar hypoventilation: Secondary | ICD-10-CM

## 2020-11-29 DIAGNOSIS — G4733 Obstructive sleep apnea (adult) (pediatric): Secondary | ICD-10-CM | POA: Diagnosis not present

## 2020-11-29 NOTE — Progress Notes (Signed)
[facility]  Chief Complaint  Patient presents with  . Follow-up    Shortness of breath with activity for past 2 years    Constitutional:  BP 132/66 (BP Location: Right Arm, Cuff Size: Normal)   Pulse 72   Temp 98 F (36.7 C) (Temporal)   Ht 5\' 2"  (1.575 m)   Wt 218 lb (98.9 kg)   SpO2 96% Comment: Room air  BMI 39.87 kg/m   Past Medical History:  Anemia, Anxiety, CAD, CKD, Depression, HTN, GERD, HH, Nephrolithiasis, HLD, LBBB, lumbar spinal stenosis, neuropathy, RLS, DM , OSA  Past Surgical History:  She  has a past surgical history that includes Right shoulder surgery (2010); Appendectomy (Age 32); Abdominal hysterectomy (1983); Cholecystectomy (1990's); Left knee surgery x 2 (1996); Kidney stone surgery (2008); Back surgery; CYSTO EXTRACTION KIDNEY STONES; Excision/release bursa hip (02/02/2012); Carpal tunnel release; Lumbar laminectomy/decompression microdiscectomy (Right, 11/26/2015); Eye surgery (Bilateral); Lumbar disc surgery (08/2016); Cardiac catheterization; Lumbar wound debridement (N/A, 10/25/2016); and RIGHT/LEFT HEART CATH AND CORONARY ANGIOGRAPHY (N/A, 10/05/2020).  Brief Summary:  Katelyn [name] is a 65 y.o. female with obstructive sleep apnea and obesity hypoventilation syndrome.        Subjective:   She is here with her husband.  I last saw her in 2018.  She was seen in hospital for respiratory failure.  Sent home with 2 liters oxygen during day and at night with Bipap.  She had Bipap initially set up through Dr. Claiborne Billings with [facility].  She has been exercise more at home and trying to change diet to lose weight.  Uses Bipap nightly.  Only sleeps about 5 or 6 hours.  No issues with mask fit.  Using 2 liters oxygen at night with Bipap, and this has improved her sleep quality and tolerance for Bipap.  Physical Exam:   Appearance - well kempt   ENMT - no sinus tenderness, no oral exudate, no LAN, Mallampati 3  airway, no stridor  Respiratory - equal breath sounds bilaterally, no wheezing or rales  CV - s1s2 regular rate and rhythm, no murmurs  Ext - no clubbing, no edema  Skin - no rashes  Psych - normal mood and affect   Pulmonary testing:   PFT 04/04/16 >> FEV1 1.77 (74%), FEV1% 87, TLC 3.69 (77%), DLCO 69%, no BD  A1AT 04/19/17 >> 122, MM  Chest Imaging:   V/Q scan 03/07/16 >> low probability for PE  CT angio chest 01/08/17 >> no PE  Sleep Tests:   HST 07/21/19 >> AHI 32.2, SpO2 low 64%  Bipap 10/01/19 >> Bipap 22/18 cm H2O  Bipap 10/30/20 to 11/28/20 >> used on 29 of 30 nights with average 4 hrs 46 min.  Average AHI 4.9 with median Bipap 21/16 and 95 th percentile Bipap 23/18 cm H2O  Cardiac Tests:   Doppler legs b/l 03/07/16 >> no DVT  Echo 10/06/20 >> EF 55 to 60%, mild LVH  Social History:  She  reports that she has never smoked. She has never used smokeless tobacco. She reports current alcohol use. She reports that she does not use drugs.  Family History:  Her family history includes Asthma in her sister; Bone cancer in her sister; Heart failure in her mother; Hypertension in her father; Lung cancer in her father; Stroke in her father.     Assessment/Plan:   Obstructive sleep apnea. - she is compliant with Bipap and reports benefit from therapy - she uses [facility] for Bipap supplies - continue auto Bipap with max IPAP 25, min EPAP 16, PS 5 cm H2O  Sleep related hypoxia. - likely from obesity hypoventilation syndrome - home oxygen set up through [employer] - continue 2 liters oxygen at night with Bipap - explained she doesn't need supplemental oxygen during the day anymore; will have this part of her set up removed  Obesity. - encouraged her to keep up with diet regime and exercise program  Coronary artery disease, chronic diastolic CHF. - follow up with Dr. Bettina Gavia with [facility]   Time Spent Involved in Patient Care on Day of Examination:  34  minutes  Follow up:  Patient Instructions  Continue Bipap and 2 liters oxygen at night  Will have your daytime portable oxygen set up removed  Follow up in 6 months   Medication List:   Allergies as of 11/29/2020      Reactions   Atorvastatin Nausea And Vomiting, Other (See Comments)   MYALGIAS   Talwin [pentazocine] Other (See Comments)   headache   Other Nausea Only   UNSPECIFIED Anesthesia      Medication List       Accurate as of November 29, 2020 12:24 PM. If you have any questions, ask your nurse or doctor.        acetaminophen 325 MG tablet Commonly known as: TYLENOL Take 2 tablets (650 mg total) by mouth every 4 (four) hours as needed for headache or mild pain.   allopurinol 100 MG tablet Commonly known as: ZYLOPRIM Take 100 mg by mouth at bedtime.   aspirin EC 81 MG tablet Take 81 mg by mouth at bedtime. Swallow whole.   carvedilol 6.25 MG tablet Commonly known as: COREG Take 6.25 mg by mouth 2 (two) times daily.   DULoxetine 60 MG capsule Commonly known as: CYMBALTA Take 60 mg by mouth at bedtime.   GLUCOSAMINE-CHONDROITIN PO Take 1 tablet by mouth 2 (two) times daily.   insulin NPH-regular Human (70-30) 100 UNIT/ML injection Inject 35-100 Units into the skin See admin instructions. Inject 35-100 units subcutaneously four times daily - before meals and at bedtime per sliding scale: CBG 150 35 units, for every 10 points over 150 add 1 unit insulin.   mirtazapine 15 MG tablet Commonly known as: REMERON Take 15 mg by mouth at bedtime.   multivitamin with minerals Tabs tablet Take 1 tablet by mouth daily. Centrum Silver Women   nitroGLYCERIN 0.4 MG SL tablet Commonly known as: NITROSTAT Place 0.4 mg under the tongue every 5 (five) minutes as needed for chest pain.   OneTouch Verio test strip Generic drug: glucose blood USE 1 STRIP TO CHECK GLUCOSE 4 TIMES DAILY AS DIRECTED   pantoprazole 40 MG tablet Commonly known as: PROTONIX Take 1  tablet by mouth once daily   pramipexole 1 MG tablet Commonly known as: MIRAPEX Take 1 mg by mouth at bedtime.   pregabalin 50 MG capsule Commonly known as: LYRICA Take 50 mg by mouth at bedtime.   rosuvastatin 10 MG tablet Commonly known as: CRESTOR Take 1 tablet (10 mg total) by mouth daily.   torsemide 20 MG tablet Commonly known as: DEMADEX Take one tablet by mouth once daily on Sunday, Tuesday, Thursday, and Saturday. Then take one tablet by mouth twice daily on Monday, Wednesday, and Friday.   TURMERIC PO Take 1,000 mg by mouth daily.   valACYclovir 1000 MG tablet Commonly known as: VALTREX Take 1,000 mg by mouth See admin instructions.  Take one tablet (1000 mg) by mouth twice daily for 2 days, then take one tablet (1000 mg) daily for 2 days - as needed for fever blisters       Signature:  Chesley Mires, MD [place] Pager - [phone] 11/29/2020, 12:24 PM

## 2020-11-29 NOTE — Patient Instructions (Signed)
Continue Bipap and 2 liters oxygen at night  Will have your daytime portable oxygen set up removed  Follow up in 6 months

## 2020-12-06 DIAGNOSIS — J984 Other disorders of lung: Secondary | ICD-10-CM | POA: Diagnosis not present

## 2020-12-06 DIAGNOSIS — R0602 Shortness of breath: Secondary | ICD-10-CM | POA: Diagnosis not present

## 2020-12-06 DIAGNOSIS — I5032 Chronic diastolic (congestive) heart failure: Secondary | ICD-10-CM | POA: Diagnosis not present

## 2020-12-08 DIAGNOSIS — M47816 Spondylosis without myelopathy or radiculopathy, lumbar region: Secondary | ICD-10-CM | POA: Diagnosis not present

## 2020-12-21 DIAGNOSIS — M47816 Spondylosis without myelopathy or radiculopathy, lumbar region: Secondary | ICD-10-CM | POA: Diagnosis not present

## 2021-01-12 DIAGNOSIS — Z6838 Body mass index (BMI) 38.0-38.9, adult: Secondary | ICD-10-CM | POA: Diagnosis not present

## 2021-01-12 DIAGNOSIS — I10 Essential (primary) hypertension: Secondary | ICD-10-CM | POA: Diagnosis not present

## 2021-01-12 DIAGNOSIS — M47816 Spondylosis without myelopathy or radiculopathy, lumbar region: Secondary | ICD-10-CM | POA: Diagnosis not present

## 2021-02-15 ENCOUNTER — Other Ambulatory Visit: Payer: Self-pay | Admitting: Cardiology

## 2021-03-14 DIAGNOSIS — J042 Acute laryngotracheitis: Secondary | ICD-10-CM | POA: Diagnosis not present

## 2021-03-14 DIAGNOSIS — E139 Other specified diabetes mellitus without complications: Secondary | ICD-10-CM | POA: Insufficient documentation

## 2021-03-21 DIAGNOSIS — J0100 Acute maxillary sinusitis, unspecified: Secondary | ICD-10-CM | POA: Diagnosis not present

## 2021-03-22 DIAGNOSIS — G4730 Sleep apnea, unspecified: Secondary | ICD-10-CM | POA: Diagnosis not present

## 2021-03-22 DIAGNOSIS — Z794 Long term (current) use of insulin: Secondary | ICD-10-CM | POA: Diagnosis not present

## 2021-03-22 DIAGNOSIS — E785 Hyperlipidemia, unspecified: Secondary | ICD-10-CM | POA: Diagnosis not present

## 2021-03-22 DIAGNOSIS — I5032 Chronic diastolic (congestive) heart failure: Secondary | ICD-10-CM | POA: Diagnosis not present

## 2021-03-22 DIAGNOSIS — E1140 Type 2 diabetes mellitus with diabetic neuropathy, unspecified: Secondary | ICD-10-CM | POA: Diagnosis not present

## 2021-03-22 DIAGNOSIS — D509 Iron deficiency anemia, unspecified: Secondary | ICD-10-CM | POA: Diagnosis not present

## 2021-03-22 DIAGNOSIS — I2510 Atherosclerotic heart disease of native coronary artery without angina pectoris: Secondary | ICD-10-CM | POA: Diagnosis not present

## 2021-03-22 DIAGNOSIS — N1831 Chronic kidney disease, stage 3a: Secondary | ICD-10-CM | POA: Diagnosis not present

## 2021-03-22 DIAGNOSIS — I700 Atherosclerosis of aorta: Secondary | ICD-10-CM | POA: Diagnosis not present

## 2021-03-22 DIAGNOSIS — M5416 Radiculopathy, lumbar region: Secondary | ICD-10-CM | POA: Diagnosis not present

## 2021-03-22 DIAGNOSIS — I130 Hypertensive heart and chronic kidney disease with heart failure and stage 1 through stage 4 chronic kidney disease, or unspecified chronic kidney disease: Secondary | ICD-10-CM | POA: Diagnosis not present

## 2021-04-12 DIAGNOSIS — N644 Mastodynia: Secondary | ICD-10-CM | POA: Diagnosis not present

## 2021-04-12 DIAGNOSIS — R921 Mammographic calcification found on diagnostic imaging of breast: Secondary | ICD-10-CM | POA: Diagnosis not present

## 2021-04-21 DIAGNOSIS — G4733 Obstructive sleep apnea (adult) (pediatric): Secondary | ICD-10-CM | POA: Diagnosis not present

## 2021-04-27 ENCOUNTER — Other Ambulatory Visit: Payer: Self-pay

## 2021-04-27 MED ORDER — CARVEDILOL 6.25 MG PO TABS: 6.2500 mg | ORAL_TABLET | Freq: Two times a day (BID) | ORAL | 0 refills | Status: DC

## 2021-05-05 DIAGNOSIS — D500 Iron deficiency anemia secondary to blood loss (chronic): Secondary | ICD-10-CM | POA: Diagnosis not present

## 2021-05-05 DIAGNOSIS — Z0000 Encounter for general adult medical examination without abnormal findings: Secondary | ICD-10-CM | POA: Diagnosis not present

## 2021-05-05 DIAGNOSIS — G2581 Restless legs syndrome: Secondary | ICD-10-CM | POA: Diagnosis not present

## 2021-05-05 DIAGNOSIS — F3340 Major depressive disorder, recurrent, in remission, unspecified: Secondary | ICD-10-CM | POA: Diagnosis not present

## 2021-05-09 ENCOUNTER — Other Ambulatory Visit: Payer: Self-pay | Admitting: Cardiology

## 2021-05-20 ENCOUNTER — Other Ambulatory Visit: Payer: Self-pay | Admitting: Cardiology

## 2021-06-05 DIAGNOSIS — R109 Unspecified abdominal pain: Secondary | ICD-10-CM | POA: Insufficient documentation

## 2021-06-06 DIAGNOSIS — R109 Unspecified abdominal pain: Secondary | ICD-10-CM | POA: Diagnosis not present

## 2021-06-06 DIAGNOSIS — R0781 Pleurodynia: Secondary | ICD-10-CM | POA: Diagnosis not present

## 2021-06-06 DIAGNOSIS — D500 Iron deficiency anemia secondary to blood loss (chronic): Secondary | ICD-10-CM | POA: Diagnosis not present

## 2021-06-09 DIAGNOSIS — Z6841 Body Mass Index (BMI) 40.0 and over, adult: Secondary | ICD-10-CM | POA: Diagnosis not present

## 2021-06-09 DIAGNOSIS — M546 Pain in thoracic spine: Secondary | ICD-10-CM | POA: Diagnosis not present

## 2021-06-09 DIAGNOSIS — I10 Essential (primary) hypertension: Secondary | ICD-10-CM | POA: Diagnosis not present

## 2021-06-16 DIAGNOSIS — Z6839 Body mass index (BMI) 39.0-39.9, adult: Secondary | ICD-10-CM | POA: Diagnosis not present

## 2021-06-16 DIAGNOSIS — I10 Essential (primary) hypertension: Secondary | ICD-10-CM | POA: Diagnosis not present

## 2021-06-16 DIAGNOSIS — M5030 Other cervical disc degeneration, unspecified cervical region: Secondary | ICD-10-CM | POA: Diagnosis not present

## 2021-06-16 DIAGNOSIS — M47812 Spondylosis without myelopathy or radiculopathy, cervical region: Secondary | ICD-10-CM | POA: Diagnosis not present

## 2021-06-16 DIAGNOSIS — M546 Pain in thoracic spine: Secondary | ICD-10-CM | POA: Diagnosis not present

## 2021-06-21 DIAGNOSIS — U071 COVID-19: Secondary | ICD-10-CM | POA: Insufficient documentation

## 2021-06-21 HISTORY — DX: COVID-19: U07.1

## 2021-07-11 ENCOUNTER — Other Ambulatory Visit: Payer: Self-pay

## 2021-07-11 DIAGNOSIS — M461 Sacroiliitis, not elsewhere classified: Secondary | ICD-10-CM | POA: Diagnosis not present

## 2021-07-11 DIAGNOSIS — M546 Pain in thoracic spine: Secondary | ICD-10-CM | POA: Diagnosis not present

## 2021-07-11 DIAGNOSIS — M47814 Spondylosis without myelopathy or radiculopathy, thoracic region: Secondary | ICD-10-CM | POA: Diagnosis not present

## 2021-07-11 DIAGNOSIS — I739 Peripheral vascular disease, unspecified: Secondary | ICD-10-CM | POA: Insufficient documentation

## 2021-07-11 DIAGNOSIS — M47816 Spondylosis without myelopathy or radiculopathy, lumbar region: Secondary | ICD-10-CM | POA: Diagnosis not present

## 2021-07-11 DIAGNOSIS — R0781 Pleurodynia: Secondary | ICD-10-CM | POA: Diagnosis not present

## 2021-07-11 HISTORY — DX: Peripheral vascular disease, unspecified: I73.9

## 2021-07-11 MED ORDER — TORSEMIDE 20 MG PO TABS: ORAL_TABLET | ORAL | 0 refills | Status: DC

## 2021-07-11 NOTE — Telephone Encounter (Signed)
Refill of Torsemide 20 mg sent to [facility].

## 2021-07-13 DIAGNOSIS — M546 Pain in thoracic spine: Secondary | ICD-10-CM | POA: Diagnosis not present

## 2021-07-13 DIAGNOSIS — M2560 Stiffness of unspecified joint, not elsewhere classified: Secondary | ICD-10-CM | POA: Diagnosis not present

## 2021-07-13 DIAGNOSIS — M6281 Muscle weakness (generalized): Secondary | ICD-10-CM | POA: Diagnosis not present

## 2021-07-20 DIAGNOSIS — M2560 Stiffness of unspecified joint, not elsewhere classified: Secondary | ICD-10-CM | POA: Diagnosis not present

## 2021-07-20 DIAGNOSIS — M6281 Muscle weakness (generalized): Secondary | ICD-10-CM | POA: Diagnosis not present

## 2021-07-20 DIAGNOSIS — M546 Pain in thoracic spine: Secondary | ICD-10-CM | POA: Diagnosis not present

## 2021-07-29 DIAGNOSIS — M6281 Muscle weakness (generalized): Secondary | ICD-10-CM | POA: Diagnosis not present

## 2021-07-29 DIAGNOSIS — M546 Pain in thoracic spine: Secondary | ICD-10-CM | POA: Diagnosis not present

## 2021-07-29 DIAGNOSIS — M2560 Stiffness of unspecified joint, not elsewhere classified: Secondary | ICD-10-CM | POA: Diagnosis not present

## 2021-08-02 DIAGNOSIS — M2560 Stiffness of unspecified joint, not elsewhere classified: Secondary | ICD-10-CM | POA: Diagnosis not present

## 2021-08-02 DIAGNOSIS — M546 Pain in thoracic spine: Secondary | ICD-10-CM | POA: Diagnosis not present

## 2021-08-02 DIAGNOSIS — M6281 Muscle weakness (generalized): Secondary | ICD-10-CM | POA: Diagnosis not present

## 2021-08-06 ENCOUNTER — Other Ambulatory Visit: Payer: Self-pay | Admitting: Cardiology

## 2021-08-09 DIAGNOSIS — M546 Pain in thoracic spine: Secondary | ICD-10-CM | POA: Diagnosis not present

## 2021-08-09 DIAGNOSIS — M2560 Stiffness of unspecified joint, not elsewhere classified: Secondary | ICD-10-CM | POA: Diagnosis not present

## 2021-08-09 DIAGNOSIS — M6281 Muscle weakness (generalized): Secondary | ICD-10-CM | POA: Diagnosis not present

## 2021-08-16 DIAGNOSIS — M6281 Muscle weakness (generalized): Secondary | ICD-10-CM | POA: Diagnosis not present

## 2021-08-16 DIAGNOSIS — M2560 Stiffness of unspecified joint, not elsewhere classified: Secondary | ICD-10-CM | POA: Diagnosis not present

## 2021-08-16 DIAGNOSIS — M546 Pain in thoracic spine: Secondary | ICD-10-CM | POA: Diagnosis not present

## 2021-08-18 DIAGNOSIS — I2510 Atherosclerotic heart disease of native coronary artery without angina pectoris: Secondary | ICD-10-CM | POA: Diagnosis not present

## 2021-08-18 DIAGNOSIS — R600 Localized edema: Secondary | ICD-10-CM | POA: Diagnosis not present

## 2021-08-18 DIAGNOSIS — M79662 Pain in left lower leg: Secondary | ICD-10-CM | POA: Diagnosis not present

## 2021-08-18 DIAGNOSIS — M79661 Pain in right lower leg: Secondary | ICD-10-CM | POA: Diagnosis not present

## 2021-08-21 DIAGNOSIS — M1711 Unilateral primary osteoarthritis, right knee: Secondary | ICD-10-CM | POA: Diagnosis not present

## 2021-08-21 DIAGNOSIS — S8391XA Sprain of unspecified site of right knee, initial encounter: Secondary | ICD-10-CM | POA: Diagnosis not present

## 2021-08-21 DIAGNOSIS — S8991XA Unspecified injury of right lower leg, initial encounter: Secondary | ICD-10-CM | POA: Diagnosis not present

## 2021-08-21 DIAGNOSIS — M25461 Effusion, right knee: Secondary | ICD-10-CM | POA: Diagnosis not present

## 2021-08-22 DIAGNOSIS — I2583 Coronary atherosclerosis due to lipid rich plaque: Secondary | ICD-10-CM | POA: Diagnosis not present

## 2021-08-22 DIAGNOSIS — I872 Venous insufficiency (chronic) (peripheral): Secondary | ICD-10-CM | POA: Diagnosis not present

## 2021-08-22 DIAGNOSIS — I83813 Varicose veins of bilateral lower extremities with pain: Secondary | ICD-10-CM | POA: Diagnosis not present

## 2021-08-22 DIAGNOSIS — M7989 Other specified soft tissue disorders: Secondary | ICD-10-CM | POA: Diagnosis not present

## 2021-08-29 DIAGNOSIS — M25561 Pain in right knee: Secondary | ICD-10-CM | POA: Diagnosis not present

## 2021-09-06 DIAGNOSIS — I872 Venous insufficiency (chronic) (peripheral): Secondary | ICD-10-CM | POA: Diagnosis not present

## 2021-09-06 DIAGNOSIS — I87393 Chronic venous hypertension (idiopathic) with other complications of bilateral lower extremity: Secondary | ICD-10-CM | POA: Diagnosis not present

## 2021-09-09 DIAGNOSIS — Z131 Encounter for screening for diabetes mellitus: Secondary | ICD-10-CM | POA: Diagnosis not present

## 2021-11-08 ENCOUNTER — Other Ambulatory Visit: Payer: Self-pay | Admitting: Cardiology

## 2021-11-14 ENCOUNTER — Ambulatory Visit: Payer: PPO | Admitting: Pulmonary Disease

## 2021-11-14 ENCOUNTER — Other Ambulatory Visit: Payer: Self-pay

## 2021-11-14 ENCOUNTER — Telehealth: Payer: Self-pay | Admitting: Cardiology

## 2021-11-14 ENCOUNTER — Encounter: Payer: Self-pay | Admitting: Pulmonary Disease

## 2021-11-14 VITALS — BP 128/72 | HR 90 | Temp 98.5°F | Ht 62.0 in | Wt 233.6 lb

## 2021-11-14 DIAGNOSIS — G4734 Idiopathic sleep related nonobstructive alveolar hypoventilation: Secondary | ICD-10-CM

## 2021-11-14 DIAGNOSIS — G4733 Obstructive sleep apnea (adult) (pediatric): Secondary | ICD-10-CM

## 2021-11-14 DIAGNOSIS — R0609 Other forms of dyspnea: Secondary | ICD-10-CM

## 2021-11-14 MED ORDER — TORSEMIDE 20 MG PO TABS: ORAL_TABLET | ORAL | 0 refills | Status: DC

## 2021-11-14 NOTE — Patient Instructions (Signed)
Will arrange for Bipap titration study at [facility] lab  Follow up in 6 months

## 2021-11-14 NOTE — Telephone Encounter (Signed)
Rx refill sent to pharmacy. 

## 2021-11-14 NOTE — Telephone Encounter (Signed)
°*  STAT* If patient is at the pharmacy, call can be transferred to refill team.   1. Which medications need to be refilled? (please list name of each medication and dose if known)  torsemide (DEMADEX) 20 MG tablet  2. Which pharmacy/location (including street and city if local pharmacy) is medication to be sent to? [facility], [place]  3. Do they need a 30 day or 90 day supply? 90 with refills  Patient is scheduled to see Dr. Bettina Gavia 02/03/22

## 2021-11-14 NOTE — Progress Notes (Signed)
[name], Critical Care, and Sleep Medicine  Chief Complaint  Patient presents with   Follow-up    Recertification for cpap. Pt does have a Bipap machine with oxygen hooked up to it. Does not use oxygen alone. Pt has questions about medications     Past Surgical History:  She  has a past surgical history that includes Right shoulder surgery (2010); Appendectomy (Age 66); Abdominal hysterectomy (1983); Cholecystectomy (1990's); Left knee surgery x 2 (1996); Kidney stone surgery (2008); Back surgery; CYSTO EXTRACTION KIDNEY STONES; Excision/release bursa hip (02/02/2012); Carpal tunnel release; Lumbar laminectomy/decompression microdiscectomy (Right, 11/26/2015); Eye surgery (Bilateral); Lumbar disc surgery (08/2016); Cardiac catheterization; Lumbar wound debridement (N/A, 10/25/2016); RIGHT/LEFT HEART CATH AND CORONARY ANGIOGRAPHY (N/A, 10/05/2020); Colonoscopy (12/12/2017); and Esophagogastroduodenoscopy (12/12/2017).  Past Medical History:  Anemia, Anxiety, CAD, CKD, Depression, HTN, GERD, HH, Nephrolithiasis, HLD, LBBB, lumbar spinal stenosis, neuropathy, RLS, DM , OSA  Constitutional:  BP 128/72 (BP Location: Left Arm, Patient Position: Sitting, Cuff Size: Normal)    Pulse 90    Temp 98.5 F (36.9 C) (Oral)    Ht 5\' 2"  (1.575 m)    Wt 233 lb 9.6 oz (106 kg)    SpO2 97%    BMI 42.73 kg/m   Brief Summary:  Katelyn [name] is a 66 y.o. female with obstructive sleep apnea and obesity hypoventilation syndrome.        Subjective:   She is here with her husband.  She uses Bipap nightly.  She was told by her DME that she needs to requalify for supplemental oxygen at night.  Not issues with mask fit.  She had COVID in November and then got a sinus infection.  Finally recovering.  Had to use nebulizer when she was sick.  Gets winded with activity and gets leg cramps.  Recovers after resting for few minutes.  Not having wheeze, or sputum.  Physical Exam:   Appearance - well  kempt   ENMT - no sinus tenderness, no oral exudate, no LAN, Mallampati 3 airway, no stridor  Respiratory - equal breath sounds bilaterally, no wheezing or rales  CV - s1s2 regular rate and rhythm, no murmurs  Ext - no clubbing, no edema  Skin - no rashes  Psych - normal mood and affect    [name] testing:  PFT 04/04/16 >> FEV1 1.77 (74%), FEV1% 87, TLC 3.69 (77%), DLCO 69%, no BD A1AT 04/19/17 >> 122, MM  Chest Imaging:  V/Q scan 03/07/16 >> low probability for PE CT angio chest 01/08/17 >> no PE V/Q scan 10/06/20 >> no perfusion defects  Sleep Tests:  HST 07/21/19 >> AHI 32.2, SpO2 low 64% Bipap 10/01/19 >> Bipap 22/18 cm H2O Bipap 10/30/20 to 11/28/20 >> used on 29 of 30 nights with average 4 hrs 46 min.  Average AHI 4.9 with median Bipap 21/16 and 95 th percentile Bipap 23/18 cm H2O  Cardiac Tests:  Doppler legs b/l 03/07/16 >> no DVT Echo 10/06/20 >> EF 55 to 60%, mild LVH  Social History:  She  reports that she has never smoked. She has never used smokeless tobacco. She reports current alcohol use. She reports that she does not use drugs.  Family History:  Her family history includes Asthma in her sister; Bone cancer in her sister; Heart failure in her mother; Hypertension in her father; Lung cancer in her father; Stroke in her father.     Assessment/Plan:   Obstructive sleep apnea. - she is compliant with Bipap and reports benefit from  therapy - she uses [facility] for Bipap supplies - continue auto Bipap max IPAP 25, min EPAP 16, PS 5 cm H2O  Sleep related hypoxia. - likely from obesity hypoventilation syndrome - home oxygen set up through [employer] - continue 2 liters oxygen at night with Bipap - will arrange for Bipap titration studying starting on Bipap 20/15 cm H2O to determine if she requalifies for supplemental oxygen use at night with Bipap  Dyspnea on exertion. - likely from diastolic CHF, peripheral artery disease and deconditioning - encouraged her  to maintain a regular exercise regimen  Obesity. - encouraged her to keep up with diet regime and exercise program  Coronary artery disease, chronic diastolic CHF. - follow up with Dr. Bettina Gavia with [facility]   Time Spent Involved in Patient Care on Day of Examination:  37 minutes  Follow up:   Patient Instructions  Will arrange for Bipap titration study at [facility] lab  Follow up in 6 months  Medication List:   Allergies as of 11/14/2021       Reactions   Atorvastatin Nausea And Vomiting, Other (See Comments)   MYALGIAS   Talwin [pentazocine] Other (See Comments)   headache   Other Nausea Only   UNSPECIFIED Anesthesia        Medication List        Accurate as of November 14, 2021 10:23 AM. If you have any questions, ask your nurse or doctor.          STOP taking these medications    TURMERIC PO Stopped by: Chesley Mires, MD       TAKE these medications    acetaminophen 325 MG tablet Commonly known as: TYLENOL Take 2 tablets (650 mg total) by mouth every 4 (four) hours as needed for headache or mild pain.   allopurinol 100 MG tablet Commonly known as: ZYLOPRIM Take 100 mg by mouth at bedtime.   aspirin EC 81 MG tablet Take 81 mg by mouth at bedtime. Swallow whole.   carvedilol 6.25 MG tablet Commonly known as: COREG Take 1 tablet (6.25 mg total) by mouth 2 (two) times daily. Take 6.25 mg by mouth 2 (two) times daily. / Needs appointment for further refills   DULoxetine 60 MG capsule Commonly known as: CYMBALTA Take 60 mg by mouth at bedtime.   GLUCOSAMINE-CHONDROITIN PO Take 1 tablet by mouth 2 (two) times daily.   insulin NPH-regular Human (70-30) 100 UNIT/ML injection Inject 35-100 Units into the skin See admin instructions. Inject 35-100 units subcutaneously four times daily - before meals and at bedtime per sliding scale: CBG 150 35 units, for every 10 points over 150 add 1 unit insulin.   mirtazapine 15 MG tablet Commonly  known as: REMERON Take 15 mg by mouth at bedtime.   multivitamin with minerals Tabs tablet Take 1 tablet by mouth daily. Centrum Silver Women   nitroGLYCERIN 0.4 MG SL tablet Commonly known as: NITROSTAT Place 0.4 mg under the tongue every 5 (five) minutes as needed for chest pain.   OneTouch Verio test strip Generic drug: glucose blood USE 1 STRIP TO CHECK GLUCOSE 4 TIMES DAILY AS DIRECTED   pantoprazole 40 MG tablet Commonly known as: PROTONIX Take 1 tablet by mouth once daily   pramipexole 1 MG tablet Commonly known as: MIRAPEX Take 1 mg by mouth at bedtime.   pregabalin 50 MG capsule Commonly known as: LYRICA Take 50 mg by mouth at bedtime.   rosuvastatin 10 MG tablet Commonly  known as: CRESTOR Take 1 tablet (10 mg total) by mouth daily.   torsemide 20 MG tablet Commonly known as: DEMADEX Take one tablet by mouth once daily on Sunday, Tuesday, Thursday, and Saturday. Then take one tablet by mouth twice daily on Monday, Wednesday, and Friday.   valACYclovir 1000 MG tablet Commonly known as: VALTREX Take 1,000 mg by mouth See admin instructions. Take one tablet (1000 mg) by mouth twice daily for 2 days, then take one tablet (1000 mg) daily for 2 days - as needed for fever blisters        Signature:  Chesley Mires, MD [place] Pager - [phone] 11/14/2021, 10:23 AM

## 2021-11-16 ENCOUNTER — Ambulatory Visit: Payer: PPO | Admitting: Cardiology

## 2021-11-16 ENCOUNTER — Other Ambulatory Visit: Payer: Self-pay

## 2021-11-16 ENCOUNTER — Encounter: Payer: Self-pay | Admitting: Cardiology

## 2021-11-16 VITALS — BP 130/80 | HR 73 | Ht 62.0 in | Wt 221.0 lb

## 2021-11-16 DIAGNOSIS — R0602 Shortness of breath: Secondary | ICD-10-CM

## 2021-11-16 DIAGNOSIS — I2510 Atherosclerotic heart disease of native coronary artery without angina pectoris: Secondary | ICD-10-CM

## 2021-11-16 DIAGNOSIS — E7800 Pure hypercholesterolemia, unspecified: Secondary | ICD-10-CM

## 2021-11-16 DIAGNOSIS — E785 Hyperlipidemia, unspecified: Secondary | ICD-10-CM

## 2021-11-16 DIAGNOSIS — G4733 Obstructive sleep apnea (adult) (pediatric): Secondary | ICD-10-CM

## 2021-11-16 DIAGNOSIS — I447 Left bundle-branch block, unspecified: Secondary | ICD-10-CM

## 2021-11-16 DIAGNOSIS — I110 Hypertensive heart disease with heart failure: Secondary | ICD-10-CM | POA: Diagnosis not present

## 2021-11-16 NOTE — Patient Instructions (Signed)
Medication Instructions:  Your physician recommends that you continue on your current medications as directed. Please refer to the Current Medication list given to you today.  *If you need a refill on your cardiac medications before your next appointment, please call your pharmacy*   Lab Work: Your physician recommends that you return for lab work in: TODAY CMP, Lipids, ProBNP If you have labs (blood work) drawn today and your tests are completely normal, you will receive your results only by: [place] (if you have MyChart) OR A paper copy in the mail If you have any lab test that is abnormal or we need to change your treatment, we will call you to review the results.   Testing/Procedures: None   Follow-Up: At [facility], you and your health needs are our priority.  As part of our continuing mission to provide you with exceptional heart care, we have created designated Provider Care Teams.  These Care Teams include your primary Cardiologist (physician) and Advanced Practice Providers (APPs -  Physician Assistants and Nurse Practitioners) who all work together to provide you with the care you need, when you need it.  We recommend signing up for the patient portal called "MyChart".  Sign up information is provided on this After Visit Summary.  MyChart is used to connect with patients for Virtual Visits (Telemedicine).  Patients are able to view lab/test results, encounter notes, upcoming appointments, etc.  Non-urgent messages can be sent to your provider as well.   To learn more about what you can do with MyChart, go to [email or web address].    Your next appointment:   1 year(s)  The format for your next appointment:   In Person  Provider:   Shirlee More, MD    Other Instructions

## 2021-11-16 NOTE — Progress Notes (Signed)
Cardiology Office Note:    Date:  11/16/2021   ID:  Katelyn [name], DOB 09-May-1956, MRN [id number]  PCP:  Katelyn Greenhouse, MD  Cardiologist:  Katelyn More, MD    Referring MD: Katelyn Greenhouse, MD    ASSESSMENT:    1. Mild CAD   2. Hypertensive heart disease with heart failure ([place])   3. LBBB (left bundle branch block)   4. SOB (shortness of breath)   5. Hyperlipidemia, unspecified hyperlipidemia type   6. OSA (obstructive sleep apnea)   7. Hypercholesterolemia    PLAN:    In order of problems listed above:  Overall Katelyn [name] is doing much better with the multi specialty approach to her problems of severe obstructive sleep apnea mild CAD hypertensive heart disease with diastolic heart failure chronic venous insufficiency.  Stable CAD having no anginal discomfort New York Heart Association class I continue aspirin beta blocker and lipid-lowering with a high intensity statin.  She has chronic stable CAD at this time I would not repeat coronary angiography or ischemia evaluation Stable BP at target continue current treatment including her loop diuretic since been markedly effective check renal function potassium today Stable EKG pattern I do not think she needs a repeat echocardiogram this year Shortness of breath is markedly improved her problem now she has a chronic productive cough since respiratory infection I asked her to contact her pulmonologist about a flutter valve Stable check lipid profile continue her high intensity statin Markedly improved and continue BiPAP with blended oxygen   Next appointment: 1 year   Medication Adjustments/Labs and Tests Ordered: Current medicines are reviewed at length with the patient today.  Concerns regarding medicines are outlined above.  Orders Placed This Encounter  Procedures   Comprehensive metabolic panel   Lipid panel   Pro b natriuretic peptide (BNP)   EKG 12-Lead   No orders of the defined types were placed in this  encounter.   Chief Complaint  Patient presents with   Annual Exam   Follow-up   Hypertension   Leg Swelling   Coronary Artery Disease    She has mild nonobstructive CAD low normal ejection fraction    History of Present Illness:    Katelyn [name] is a 66 y.o. female with a hx of  hypertension mild CAD dyslipidemia left bundle branch block with an ejection fraction of 55 to 60% restrictive lung disease with shortness of breath due to deconditioning, obesity and statin intolerance  last seen 10/22/2020.  She underwent repeat coronary angiography 10/05/2020 which showed mild nonobstructive CAD low normal ejection fraction estimated 50% elevated filling pressure mild elevation of pulmonary artery pressure and right heart pressures. Echocardiogram done at [facility] 10/06/2020 showed EF normal at 50 to 55% mild LVH indeterminate diastolic pressure normal right heart function and no significant valvular abnormality. She had presented to the hospital with shortness of breath and chest pain.  Noted to have nocturnal desaturation down to 83% at night and ambulation 86% and was referred to pulmonary for her untreated severe sleep apnea.  Recent labs 06/06/2021: Hemoglobin 11.4  She was seen by pulmonary at 11/14/2021 for severe obstructive sleep apnea is treated with BiPAP and oxygen and notation that she had COVID-19 infection in November.  Compliance with diet, lifestyle and medications: Yes  She is doing much better with her BiPAP and oxygen her chronic fatigue and weakness has resolved her edema has lessened and still there she is improved with treatment by vascular  and vein support as well as and her diuretic. She is not having exertional shortness of breath chest pain palpitation or syncope She is having chronic sputum production I told her to speak with the pulmonary doctor about a flutter valve I see not remarkably helpful for patients She tolerates her statin without muscle pain  or weakness Past Medical History:  Diagnosis Date   Anxiety    Arthritis    Bursitis of right hip    CAD (coronary artery disease)    Cardiac catheterization June 2014 in [place] - 50% circumflex stenosis   Chest pain, neg MI, stable CAD non obstructive on cath 10/05/20 10/04/2020   Chronic diastolic heart failure ([place]) 08/20/2017   Chronic kidney disease, stage 3 ([place])    does not see nephrologist   Chronic low back pain without sciatica 03/14/2016   CKD (chronic kidney disease), stage III ([place]) 65/46/5035   Complication of anesthesia    Cough 04/19/2017   Overview:  Last Assessment & Plan:  Formatting of this note may be different from the original. Cough - ? ACE related with AR triggers   Plan  Patient Instructions  Discuss with your primary doctor that lisinopril pain, need making your cough worse. May use Mucinex DM twice daily as needed for cough and congestion Zyrtec 10 mg at bedtime as needed for drainage Saline nasal spray as needed. Lab tests today Activity as tolerated. Follow with Dr. [name] in 3-4 months and As needed   Please contact office for sooner follow up if symptoms do not improve or worsen or seek emergency care    Depression    Dyspnea    with exertion   " lazy lung" - per  [name] from back issues- 06/2016   Elevated liver enzymes 12/05/2016   Essential hypertension    GERD (gastroesophageal reflux disease)    Gout 03/14/2016   Greater trochanteric bursitis of right hip 02/02/2012   H/O hiatal hernia    Heart murmur    History of blood transfusion 2016   History of esophageal stricture 10/07/2020   History of kidney stones    Hypercholesterolemia    Hypertensive heart disease with heart failure ([place]) 01/01/2017   Hypoxia 10/07/2020   Iliotibial band syndrome of right side 02/02/2012   Iron deficiency anemia due to chronic blood loss 03/14/2016   LBBB (left bundle branch block) 01/01/2017   Left bundle branch block    Leg weakness 10/07/2020   Lumbar stenosis     Meralgia paraesthetica 12/05/2016   Mild CAD 11/24/2015   Morbid obesity ([place]) 10/07/2020   Neuropathy    OSA (obstructive sleep apnea) 05/24/2016   Overview:  Managed PULM   PONV (postoperative nausea and vomiting)    "no N/V with patch"   Restless leg syndrome    S/P lumbar laminectomy 11/26/2015   S/P lumbar spinal fusion 08/29/2016   Tinnitus 12/05/2016   Type 2 diabetes mellitus ([place])    UTI (urinary tract infection) 10/07/2020    Past Surgical History:  Procedure Laterality Date   ABDOMINAL HYSTERECTOMY  1983   APPENDECTOMY  Age 61   [place] 2012 AND FUSION WITH INSTRUMENTATION SEPT 2012   CARDIAC CATHETERIZATION     x 2   CARPAL TUNNEL RELEASE     bil   CHOLECYSTECTOMY  1990's   COLONOSCOPY  12/12/2017   Colonic polyp status post polypectomy. Mild sigmoid diverticulosis. Otherwise normal colonoscopy to terminal  ileum.   CYSTO EXTRACTION KIDNEY STONES     ESOPHAGOGASTRODUODENOSCOPY  12/12/2017   Small hiatal hernia. Mild gastritis. Status post esophageal dilatation.   EXCISION/RELEASE BURSA HIP  02/02/2012   Procedure: EXCISION/RELEASE BURSA HIP;  Surgeon: Tobi Bastos, MD;  Location: [facility];  Service: Orthopedics;  Laterality: Right;  Right Hip Bursectomy   EYE SURGERY Bilateral    cataracts   KIDNEY STONE SURGERY  2008   Left knee surgery x 2  1996   reconstruction   LUMBAR DISC SURGERY  08/2016   LUMBAR LAMINECTOMY/DECOMPRESSION MICRODISCECTOMY Right 11/26/2015   Procedure: Extraforaminal Microdiscectomy  - Lumbar two-three- right;  Surgeon: Eustace Moore, MD;  Location: [place] [facility];  Service: Neurosurgery;  Laterality: Right;  right    LUMBAR WOUND DEBRIDEMENT N/A 10/25/2016   Procedure: Lumbar wound revision;  Surgeon: Eustace Moore, MD;  Location: [place];  Service: Neurosurgery;  Laterality: N/A;  Lumbar wound revision   Right shoulder surgery  2010   spur   RIGHT/LEFT HEART CATH AND CORONARY ANGIOGRAPHY N/A  10/05/2020   Procedure: RIGHT/LEFT HEART CATH AND CORONARY ANGIOGRAPHY;  Surgeon: Martinique, Peter M, MD;  Location: [place] [facility];  Service: Cardiovascular;  Laterality: N/A;    Current Medications: Current Meds  Medication Sig   acetaminophen (TYLENOL) 325 MG tablet Take 2 tablets (650 mg total) by mouth every 4 (four) hours as needed for headache or mild pain.   allopurinol (ZYLOPRIM) 100 MG tablet Take 100 mg by mouth at bedtime.   aspirin EC 81 MG tablet Take 81 mg by mouth at bedtime. Swallow whole.   carvedilol (COREG) 6.25 MG tablet Take 1 tablet (6.25 mg total) by mouth 2 (two) times daily. Take 6.25 mg by mouth 2 (two) times daily. / Needs appointment for further refills   DULoxetine (CYMBALTA) 60 MG capsule Take 60 mg by mouth at bedtime.    GLUCOSAMINE-CHONDROITIN PO Take 1 tablet by mouth 2 (two) times daily.   metFORMIN (GLUCOPHAGE-XR) 500 MG 24 hr tablet Take 500 mg by mouth 2 (two) times daily.   mirtazapine (REMERON) 15 MG tablet Take 15 mg by mouth at bedtime.   Multiple Vitamin (MULTIVITAMIN WITH MINERALS) TABS tablet Take 1 tablet by mouth daily. Centrum Silver Women   Multiple Vitamins-Minerals (HAIR SKIN AND NAILS FORMULA) TABS Take 1 tablet by mouth daily.   nitroGLYCERIN (NITROSTAT) 0.4 MG SL tablet Place 0.4 mg under the tongue every 5 (five) minutes as needed for chest pain.    NOVOLOG FLEXPEN 100 UNIT/ML FlexPen Inject 55 Units into the skin 3 (three) times daily with meals as needed for high blood sugar. As needed for blood sugar over 150. Add 1 unit for every point over 150   pantoprazole (PROTONIX) 40 MG tablet Take 1 tablet by mouth once daily   pramipexole (MIRAPEX) 1 MG tablet Take 1 mg by mouth at bedtime.   pregabalin (LYRICA) 75 MG capsule Take 75 mg by mouth at bedtime.   rosuvastatin (CRESTOR) 10 MG tablet Take 1 tablet (10 mg total) by mouth daily.   torsemide (DEMADEX) 20 MG tablet Take one tablet by mouth once daily on Sunday, Tuesday, Thursday, and  Saturday. Then take one tablet by mouth twice daily on Monday, Wednesday, and Friday.   TRESIBA FLEXTOUCH 200 UNIT/ML FlexTouch Pen Inject 110 Units into the skin daily.   valACYclovir (VALTREX) 1000 MG tablet Take 1,000 mg by mouth See admin instructions. Take one tablet (1000 mg) by mouth twice daily  for 2 days, then take one tablet (1000 mg) daily for 2 days - as needed for fever blisters   [DISCONTINUED] pregabalin (LYRICA) 50 MG capsule Take 50 mg by mouth at bedtime.   Current Facility-Administered Medications for the 11/16/21 encounter (Office Visit) with Richardo Priest, MD  Medication   triamcinolone acetonide (KENALOG) 10 MG/ML injection 10 mg   triamcinolone acetonide (KENALOG) 10 MG/ML injection 10 mg     Allergies:   Atorvastatin, Talwin [pentazocine], and Other   Social History   Socioeconomic History   Marital status: Married    Spouse name: Not on file   Number of children: Not on file   Years of education: Not on file   Highest education level: Not on file  Occupational History   Occupation: Archivist  Tobacco Use   Smoking status: Never   Smokeless tobacco: Never   Tobacco comments:    Prior secondhand smoke  Vaping Use   Vaping Use: Never used  Substance and Sexual Activity   Alcohol use: Yes    Alcohol/week: 0.0 standard drinks    Comment: Rarely   Drug use: No   Sexual activity: Not on file  Other Topics Concern   Not on file  Social History Narrative   Not on file   Social Determinants of Health   Financial Resource Strain: Not on file  Food Insecurity: Not on file  Transportation Needs: Not on file  Physical Activity: Not on file  Stress: Not on file  Social Connections: Not on file     Family History: The patient's family history includes Asthma in her sister; Bone cancer in her sister; Heart failure in her mother; Hypertension in her father; Lung cancer in her father; Stroke in her father. ROS:   Please see the history of  present illness.    All other systems reviewed and are negative.  EKGs/Labs/Other Studies Reviewed:    The following studies were reviewed today:  EKG:  EKG ordered today and personally reviewed.  The ekg ordered today demonstrates sinus rhythm left bundle branch block repolarization changes unchanged from previous  Recent Labs: No results found for requested labs within last 8760 hours.  Recent Lipid Panel    Component Value Date/Time   CHOL 184 10/05/2020 0228   TRIG 130 10/05/2020 0228   HDL 44 10/05/2020 0228   CHOLHDL 4.2 10/05/2020 0228   VLDL 26 10/05/2020 0228   LDLCALC 114 (H) 10/05/2020 0228    Physical Exam:    VS:  BP 130/80 (BP Location: Right Arm, Patient Position: Sitting, Cuff Size: Large)    Pulse 73    Ht 5\' 2"  (1.575 m)    Wt 221 lb (100.2 kg)    SpO2 94%    BMI 40.42 kg/m     Wt Readings from Last 3 Encounters:  11/16/21 221 lb (100.2 kg)  11/14/21 233 lb 9.6 oz (106 kg)  11/29/20 218 lb (98.9 kg)     GEN: Overall appearance is markedly improved well nourished, well developed in no acute distress HEENT: Normal NECK: No JVD; No carotid bruits LYMPHATICS: No lymphadenopathy CARDIAC: Paradoxical second heart sound RRR, no murmurs, rubs, gallops RESPIRATORY:  Clear to auscultation without rales, wheezing or rhonchi  ABDOMEN: Soft, non-tender, non-distended MUSCULOSKELETAL: 1-2+ bilateral lower extremity edema; No deformity  SKIN: Warm and dry NEUROLOGIC:  Alert and oriented x 3 PSYCHIATRIC:  Normal affect    Signed, Katelyn More, MD  11/16/2021 11:39 AM    [facility]

## 2021-11-18 ENCOUNTER — Telehealth: Payer: Self-pay

## 2021-11-18 LAB — LIPID PANEL
Chol/HDL Ratio: 3.5 ratio (ref 0.0–4.4)
Cholesterol, Total: 178 mg/dL (ref 100–199)
HDL: 51 mg/dL (ref >39)
LDL Chol Calc (NIH): 104 mg/dL — ABNORMAL HIGH (ref 0–99)
Triglycerides: 132 mg/dL (ref 0–149)
VLDL Cholesterol Cal: 23 mg/dL (ref 5–40)

## 2021-11-18 LAB — COMPREHENSIVE METABOLIC PANEL
ALT: 46 IU/L — ABNORMAL HIGH (ref 0–32)
AST: 54 IU/L — ABNORMAL HIGH (ref 0–40)
Albumin/Globulin Ratio: 1.2 (ref 1.2–2.2)
Albumin: 4 g/dL (ref 3.8–4.8)
Alkaline Phosphatase: 196 IU/L — ABNORMAL HIGH (ref 44–121)
BUN/Creatinine Ratio: 16 (ref 12–28)
BUN: 19 mg/dL (ref 8–27)
Bilirubin Total: 0.6 mg/dL (ref 0.0–1.2)
CO2: 27 mmol/L (ref 20–29)
Calcium: 9.1 mg/dL (ref 8.7–10.3)
Chloride: 97 mmol/L (ref 96–106)
Creatinine, Ser: 1.22 mg/dL — ABNORMAL HIGH (ref 0.57–1.00)
Globulin, Total: 3.3 g/dL (ref 1.5–4.5)
Glucose: 355 mg/dL — ABNORMAL HIGH (ref 70–99)
Potassium: 4.7 mmol/L (ref 3.5–5.2)
Sodium: 140 mmol/L (ref 134–144)
Total Protein: 7.3 g/dL (ref 6.0–8.5)
eGFR: 50 mL/min/{1.73_m2} — ABNORMAL LOW (ref >59)

## 2021-11-18 LAB — PRO B NATRIURETIC PEPTIDE: NT-Pro BNP: 114 pg/mL (ref 0–287)

## 2021-11-18 NOTE — Telephone Encounter (Signed)
Patient notified of results.

## 2021-11-18 NOTE — Telephone Encounter (Signed)
-----   Message from Richardo Priest, MD sent at 11/18/2021  7:48 AM EST ----- Good result some minor liver function test abnormality I do not think that this is significant I would not change her medications and she will need to discuss this with her PCP at her next visit.

## 2021-11-24 ENCOUNTER — Other Ambulatory Visit: Payer: Self-pay

## 2021-11-24 MED ORDER — TORSEMIDE 20 MG PO TABS: ORAL_TABLET | ORAL | 3 refills | Status: DC

## 2021-11-24 NOTE — Telephone Encounter (Signed)
Torsemide 20 mg # 126 x 3 refills sent to [facility]

## 2021-12-15 ENCOUNTER — Ambulatory Visit (HOSPITAL_BASED_OUTPATIENT_CLINIC_OR_DEPARTMENT_OTHER): Payer: PPO | Attending: Pulmonary Disease | Admitting: Pulmonary Disease

## 2021-12-15 ENCOUNTER — Other Ambulatory Visit: Payer: Self-pay

## 2021-12-15 DIAGNOSIS — G4733 Obstructive sleep apnea (adult) (pediatric): Secondary | ICD-10-CM | POA: Diagnosis present

## 2021-12-15 DIAGNOSIS — Z79899 Other long term (current) drug therapy: Secondary | ICD-10-CM | POA: Diagnosis not present

## 2021-12-16 ENCOUNTER — Encounter (HOSPITAL_BASED_OUTPATIENT_CLINIC_OR_DEPARTMENT_OTHER): Payer: PPO | Admitting: Pulmonary Disease

## 2021-12-20 ENCOUNTER — Telehealth: Payer: Self-pay | Admitting: Pulmonary Disease

## 2021-12-20 DIAGNOSIS — G4733 Obstructive sleep apnea (adult) (pediatric): Secondary | ICD-10-CM

## 2021-12-20 NOTE — Telephone Encounter (Signed)
Bipap titration 12/15/21 >> Bipap 21/16 >> AHI 1.1, didn't need oxygen with Bipap. ? ? ?Please let her know her sleep study shows that she did well with Bipap and didn't need to use supplemental oxygen at night anymore.  She should continue using Bipap at night. ?

## 2021-12-20 NOTE — Procedures (Signed)
? ? ? ?  Patient Name: Katelyn [name], Katelyn [name] ?Study Date: 12/15/2021 ?Gender: Female ?D.O.B: [birth year] ?Age (years): 40 ?Referring Provider: Chesley Mires MD, ABSM ?Height (inches): 62 ?Interpreting Physician: Chesley Mires MD, ABSM ?Weight (lbs): 220 ?RPSGT: Jorge Ny ?BMI: 40 ?MRN: [id number] ?Neck Size: 16.50 ? ?CLINICAL INFORMATION ?The patient is referred for a BiPAP titration to treat sleep apnea and determine if she needs to continue supplemental oxygen at night with Bipap. ? ?Date of NPSG, Split Night or HST 07/21/19: AHI 32.2, SpO2 low 64%. ? ?SLEEP STUDY TECHNIQUE ?As per the AASM Manual for the Scoring of Sleep and Associated Events v2.3 (April 2016) with a hypopnea requiring 4% desaturations. ? ?The channels recorded and monitored were frontal, central and occipital EEG, electrooculogram (EOG), submentalis EMG (chin), nasal and oral airflow, thoracic and abdominal wall motion, anterior tibialis EMG, snore microphone, electrocardiogram, and pulse oximetry. Bilevel positive airway pressure (BPAP) was initiated at the beginning of the study and titrated to treat sleep-disordered breathing. ? ?MEDICATIONS ?Medications self-administered by patient taken the night of the study : NOVOLOG, [place] ? ?RESPIRATORY PARAMETERS ?Optimal IPAP Pressure (cm): 21 AHI at Optimal Pressure (/hr) 1.1 ?Optimal EPAP Pressure (cm): 16  ? ?Overall Minimal O2 (%): 86.0 Minimal O2 at Optimal Pressure (%): 87.0 ?SLEEP ARCHITECTURE ?Start Time: 10:13:18 PM Stop Time: 4:47:49 AM Total Time (min): 394.5 Total Sleep Time (min): 345.5 ?Sleep Latency (min): 20.0 Sleep Efficiency (%): 87.6% REM Latency (min): N/A WASO (min): 29.0 ?Stage N1 (%): 3.2% Stage N2 (%): 96.8% Stage N3 (%): 0.0% Stage R (%): 0 ?Supine (%): 42.84 Arousal Index (/hr): 15.1  ? ?CARDIAC DATA ?The 2 lead EKG demonstrated sinus rhythm. The mean heart rate was 76.7 beats per minute. Other EKG findings include: None. ? ?LEG MOVEMENT DATA ?The total Periodic Limb Movements of  Sleep (PLMS) were 0. The PLMS index was 0.0. A PLMS index of <15 is considered normal in adults. ? ?IMPRESSIONS ?- She did well with Bipap 21/16 cm H2O.  ?- She did not need supplemental oxygen during this study. ? ?DIAGNOSIS ?- Obstructive Sleep Apnea (G47.33) ? ?RECOMMENDATIONS ?- Trial of BiPAP therapy on 21/16 cm H2O with a Small size [employer] Full Face Simplus mask and heated humidification. ?- Avoid alcohol, sedatives and other CNS depressants that may worsen sleep apnea and disrupt normal sleep architecture. ?- Sleep hygiene should be reviewed to assess factors that may improve sleep quality. ?- Weight management and regular exercise should be initiated or continued. ? ?[Electronically signed] 12/20/2021 12:45 PM ? ?Chesley Mires MD, ABSM ?Diplomate, Tax adviser of Sleep Medicine ?NPI: [id number] ? ?[facility] ?PH: [phone]   FX: [phone] ?ACCREDITED BY THE AMERICAN ACADEMY OF SLEEP MEDICINE ? ?

## 2021-12-20 NOTE — Telephone Encounter (Signed)
Called and went over results with patient. She voiced understanding. Will send order for O2 to be picked up. Patient is aware. Nothing further needed.  ?

## 2021-12-26 ENCOUNTER — Telehealth: Payer: Self-pay | Admitting: Pulmonary Disease

## 2021-12-26 NOTE — Telephone Encounter (Signed)
Called and spoke with [employer]. They said they needed a prescription from the doctor stating she doesn't need oxygen anymore.  ? ?I will call the patient and let her know we are working on it.  ? ?PCC's can you refax this order? ?

## 2021-12-28 NOTE — Telephone Encounter (Signed)
Spoke to [employer] at [employer].  She has put in a ticket to have this picked up.  I called pt and made her aware.  Nothing further needed.  ?

## 2022-01-23 ENCOUNTER — Other Ambulatory Visit: Payer: Self-pay | Admitting: Cardiology

## 2022-02-03 ENCOUNTER — Ambulatory Visit: Payer: PPO | Admitting: Cardiology

## 2022-03-12 ENCOUNTER — Other Ambulatory Visit: Payer: Self-pay | Admitting: Cardiology

## 2022-03-13 NOTE — Telephone Encounter (Signed)
Rx refill sent to pharmacy. 

## 2022-03-31 ENCOUNTER — Telehealth: Payer: Self-pay | Admitting: Cardiology

## 2022-04-13 ENCOUNTER — Ambulatory Visit: Payer: PPO | Admitting: Podiatry

## 2022-04-13 DIAGNOSIS — E1122 Type 2 diabetes mellitus with diabetic chronic kidney disease: Secondary | ICD-10-CM | POA: Diagnosis not present

## 2022-04-13 DIAGNOSIS — M79674 Pain in right toe(s): Secondary | ICD-10-CM

## 2022-04-13 DIAGNOSIS — N644 Mastodynia: Secondary | ICD-10-CM | POA: Insufficient documentation

## 2022-04-13 DIAGNOSIS — L600 Ingrowing nail: Secondary | ICD-10-CM | POA: Diagnosis not present

## 2022-04-13 DIAGNOSIS — B351 Tinea unguium: Secondary | ICD-10-CM | POA: Diagnosis not present

## 2022-04-13 DIAGNOSIS — Z794 Long term (current) use of insulin: Secondary | ICD-10-CM

## 2022-04-13 DIAGNOSIS — N183 Chronic kidney disease, stage 3 unspecified: Secondary | ICD-10-CM

## 2022-04-13 DIAGNOSIS — E119 Type 2 diabetes mellitus without complications: Secondary | ICD-10-CM | POA: Diagnosis not present

## 2022-04-13 NOTE — Patient Instructions (Signed)
EPSOM SALT FOOT SOAK INSTRUCTIONS  *IF YOU HAVE BEEN PRESCRIBED ANTIBIOTICS, TAKE AS INSTRUCTED UNTIL ALL ARE GONE*  Shopping List:  A. Plain epsom salt (not scented) B. Neosporin Cream/Ointment or Bacitracin Cream/Ointment (or prescribed antiobiotic drops/cream/ointment) C. 1-inch fabric band-aids   Place 1/4 cup of epsom salts in 2 quarts of warm tap water. IF YOU ARE DIABETIC, OR HAVE NEUROPATHY, CHECK THE TEMPERATURE OF THE WATER WITH YOUR ELBOW.   Submerge your foot/feet in the solution and soak for 10-15 minutes.      3.  Next, remove your foot/feet from solution, blot dry the affected area.    4.  Apply light amount of antibiotic cream/ointment and cover with fabric band-aid .  5.  This soak should be done once a day for 3-5 days.   6.  Monitor for any signs/symptoms of infection such as redness, swelling, odor, drainage, increased pain, or non-healing of digit.   7.  Please do not hesitate to call the office and speak to a Nurse or Doctor if you have questions.   8.  If you experience fever, chills, nightsweats, nausea or vomiting with worsening of digit/foot, please go to the emergency room.

## 2022-04-19 ENCOUNTER — Encounter: Payer: Self-pay | Admitting: Podiatry

## 2022-04-19 NOTE — Progress Notes (Signed)
ANNUAL DIABETIC FOOT EXAM  Subjective: Katelyn [name] presents today for annual diabetic foot examination and painful elongated mycotic toenails 1-5 bilaterally which are tender when wearing enclosed shoe gear. Pain is relieved with periodic professional debridement..  Patient confirms h/o diabetes.  Patient denies any h/o foot wounds.  Patient admits symptoms of pins/needles sensation in feet.  Patient denies any numbness, tingling, burning, or pins/needle sensation in feet.  Patient's blood sugar was 115 mg/dl today. Last known  HgA1c was 7.0%   Patient did not check blood glucose this morning.  Patient does not monitor blood glucose daily.  Risk factors: diabetes, neuropathy, HTN, CAD, CKD, hyperlipidemia.  Katelyn Greenhouse, MD is patient's PCP. Last visit was April 13, 2022.  Past Medical History:  Diagnosis Date   Anxiety    Arthritis    Bursitis of right hip    CAD (coronary artery disease)    Cardiac catheterization June 2014 in [place] - 50% circumflex stenosis   Chest pain, neg MI, stable CAD non obstructive on cath 10/05/20 10/04/2020   Chronic diastolic heart failure (Katelyn [name]) 08/20/2017   Chronic kidney disease, stage 3 (Katelyn [name])    does not see nephrologist   Chronic low back pain without sciatica 03/14/2016   CKD (chronic kidney disease), stage III (Katelyn [name]) 16/04/3709   Complication of anesthesia    Cough 04/19/2017   Overview:  Last Assessment & Plan:  Formatting of this note may be different from the original. Cough - ? ACE related with AR triggers   Plan  Patient Instructions  Discuss with your primary doctor that lisinopril pain, need making your cough worse. May use Mucinex DM twice daily as needed for cough and congestion Zyrtec 10 mg at bedtime as needed for drainage Saline nasal spray as needed. Lab tests today Activity as tolerated. Follow with Dr. [name] in 3-4 months and As needed   Please contact office for sooner follow up if symptoms do not improve or worsen or seek  emergency care    Depression    Dyspnea    with exertion   " lazy lung" - per  [name] from back issues- 06/2016   Elevated liver enzymes 12/05/2016   Essential hypertension    GERD (gastroesophageal reflux disease)    Gout 03/14/2016   Greater trochanteric bursitis of right hip 02/02/2012   H/O hiatal hernia    Heart murmur    History of blood transfusion 2016   History of esophageal stricture 10/07/2020   History of kidney stones    Hypercholesterolemia    Hypertensive heart disease with heart failure ([place]) 01/01/2017   Hypoxia 10/07/2020   Iliotibial band syndrome of right side 02/02/2012   Katelyn deficiency anemia due to chronic blood loss 03/14/2016   LBBB (left bundle branch block) 01/01/2017   Left bundle branch block    Leg weakness 10/07/2020   Lumbar stenosis    Meralgia paraesthetica 12/05/2016   Mild CAD 11/24/2015   Morbid obesity (Katelyn [name]) 10/07/2020   Neuropathy    OSA (obstructive sleep apnea) 05/24/2016   Overview:  Managed PULM   PONV (postoperative nausea and vomiting)    "no N/V with patch"   Restless leg syndrome    S/P lumbar laminectomy 11/26/2015   S/P lumbar spinal fusion 08/29/2016   Tinnitus 12/05/2016   Type 2 diabetes mellitus ([place])    UTI (urinary tract infection) 10/07/2020   Patient Active Problem List   Diagnosis Date Noted   Type 2 diabetes mellitus ([place])  Restless leg syndrome    PONV (postoperative nausea and vomiting)    Neuropathy    Lumbar stenosis    Left bundle branch block    Hypercholesterolemia    Heart murmur    History of kidney stones    H/O hiatal hernia    Essential hypertension    Dyspnea    Depression    Complication of anesthesia    Chronic kidney disease, stage 3 ([facility])    CAD (coronary artery disease)    Bursitis of right hip    Arthritis    Anxiety    Morbid obesity (Katelyn [name]) 10/07/2020   Hypoxia 10/07/2020   History of esophageal stricture 10/07/2020   CKD (chronic kidney disease), stage III (Katelyn [name]) 10/07/2020   Leg weakness  10/07/2020   UTI (urinary tract infection) 10/07/2020   Chest pain, neg MI, stable CAD non obstructive on cath 10/05/20 10/04/2020   Shortness of breath 06/11/2019   Chronic diastolic heart failure ([place]) 08/20/2017   Genetic testing 08/17/2017   Family history of ovarian cancer 08/17/2017   Family history of breast cancer 08/17/2017   Cough 04/19/2017   DOE (dyspnea on exertion) 04/19/2017   Hypertensive heart disease with heart failure ([place]) 01/01/2017   Hyperlipemia 01/01/2017   LBBB (left bundle branch block) 01/01/2017   Elevated liver enzymes 12/05/2016   Meralgia paraesthetica 12/05/2016   Tinnitus 12/05/2016   S/P lumbar spinal fusion 08/29/2016   OSA (obstructive sleep apnea) 05/24/2016   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   Chronic low back pain without sciatica 03/14/2016   GERD (gastroesophageal reflux disease) 03/14/2016   Gout 03/14/2016   Katelyn deficiency anemia due to chronic blood loss 03/14/2016   Type 2 diabetes mellitus without complication, with long-term current use of insulin (Katelyn [name]) 03/14/2016   Cannot sleep 12/07/2015   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Katelyn [name]) 12/07/2015   Restless leg 12/07/2015   S/P lumbar laminectomy 11/26/2015   Postprocedural state 11/26/2015   Mild CAD 11/24/2015   History of blood transfusion 2016   Greater trochanteric bursitis of right hip 02/02/2012   Iliotibial band syndrome of right side 02/02/2012   Iliotibial band syndrome 02/02/2012   Bursitis, trochanteric 02/02/2012   Past Surgical History:  Procedure Laterality Date   ABDOMINAL HYSTERECTOMY  1983   APPENDECTOMY  Age 25   Katelyn [name] 2012 AND FUSION WITH INSTRUMENTATION SEPT 2012   CARDIAC CATHETERIZATION     x 2   CARPAL TUNNEL RELEASE     bil   CHOLECYSTECTOMY  1990's   COLONOSCOPY  12/12/2017   Colonic polyp status post polypectomy. Mild sigmoid diverticulosis. Otherwise normal colonoscopy to terminal ileum.    CYSTO EXTRACTION KIDNEY STONES     ESOPHAGOGASTRODUODENOSCOPY  12/12/2017   Small hiatal hernia. Mild gastritis. Status post esophageal dilatation.   EXCISION/RELEASE BURSA HIP  02/02/2012   Procedure: EXCISION/RELEASE BURSA HIP;  Surgeon: Tobi Bastos, MD;  Location: [facility];  Service: Orthopedics;  Laterality: Right;  Right Hip Bursectomy   EYE SURGERY Bilateral    cataracts   KIDNEY STONE SURGERY  2008   Left knee surgery x 2  1996   reconstruction   LUMBAR DISC SURGERY  08/2016   LUMBAR LAMINECTOMY/DECOMPRESSION MICRODISCECTOMY Right 11/26/2015   Procedure: Extraforaminal Microdiscectomy  - Lumbar two-three- right;  Surgeon: Eustace Moore, MD;  Location: [facility];  Service: Neurosurgery;  Laterality: Right;  right    LUMBAR WOUND DEBRIDEMENT N/A 10/25/2016  Procedure: Lumbar wound revision;  Surgeon: Eustace Moore, MD;  Location: [facility];  Service: Neurosurgery;  Laterality: N/A;  Lumbar wound revision   Right shoulder surgery  2010   spur   RIGHT/LEFT HEART CATH AND CORONARY ANGIOGRAPHY N/A 10/05/2020   Procedure: RIGHT/LEFT HEART CATH AND CORONARY ANGIOGRAPHY;  Surgeon: Martinique, Peter M, MD;  Location: [facility];  Service: Cardiovascular;  Laterality: N/A;   Current Outpatient Medications on File Prior to Visit  Medication Sig Dispense Refill   acetaminophen (TYLENOL) 325 MG tablet Take 2 tablets (650 mg total) by mouth every 4 (four) hours as needed for headache or mild pain.     allopurinol (ZYLOPRIM) 100 MG tablet Take 100 mg by mouth at bedtime.     aspirin EC 81 MG tablet Take 81 mg by mouth at bedtime. Swallow whole.     carvedilol (COREG) 6.25 MG tablet Take 1 tablet (6.25 mg total) by mouth 2 (two) times daily. Take 6.25 mg by mouth 2 (two) times daily. / Needs appointment for further refills 60 tablet 0   DULoxetine (CYMBALTA) 60 MG capsule Take 60 mg by mouth at bedtime.      GLUCOSAMINE-CHONDROITIN PO Take 1 tablet by mouth 2 (two) times daily.      metFORMIN (GLUCOPHAGE-XR) 500 MG 24 hr tablet Take 500 mg by mouth 2 (two) times daily.     mirtazapine (REMERON) 15 MG tablet Take 15 mg by mouth at bedtime.     Multiple Vitamin (MULTIVITAMIN WITH MINERALS) TABS tablet Take 1 tablet by mouth daily. Centrum Silver Women     Multiple Vitamins-Minerals (HAIR SKIN AND NAILS FORMULA) TABS Take 1 tablet by mouth daily.     nitroGLYCERIN (NITROSTAT) 0.4 MG SL tablet Place 0.4 mg under the tongue every 5 (five) minutes as needed for chest pain.      NOVOLOG FLEXPEN 100 UNIT/ML FlexPen Inject 55 Units into the skin 3 (three) times daily with meals as needed for high blood sugar. As needed for blood sugar over 150. Add 1 unit for every point over 150     pantoprazole (PROTONIX) 40 MG tablet Take 1 tablet by mouth once daily 90 tablet 0   pramipexole (MIRAPEX) 1 MG tablet Take 1 mg by mouth at bedtime.     pregabalin (LYRICA) 75 MG capsule Take 75 mg by mouth at bedtime.     rosuvastatin (CRESTOR) 10 MG tablet Take 1 tablet (10 mg total) by mouth daily. 30 tablet 6   torsemide (DEMADEX) 20 MG tablet Take one tablet by mouth once daily on Sunday, Tuesday, Thursday, and Saturday. Then take one tablet by mouth twice daily on Monday, Wednesday, and Friday. 126 tablet 3   TRESIBA FLEXTOUCH 200 UNIT/ML FlexTouch Pen Inject 110 Units into the skin daily.     valACYclovir (VALTREX) 1000 MG tablet Take 1,000 mg by mouth See admin instructions. Take one tablet (1000 mg) by mouth twice daily for 2 days, then take one tablet (1000 mg) daily for 2 days - as needed for fever blisters     Current Facility-Administered Medications on File Prior to Visit  Medication Dose Route Frequency Provider Last Rate Last Admin   triamcinolone acetonide (KENALOG) 10 MG/ML injection 10 mg  10 mg Other Once Landis Martins, DPM       triamcinolone acetonide (KENALOG) 10 MG/ML injection 10 mg  10 mg Other Once Landis Martins, DPM        Allergies  Allergen Reactions   Atorvastatin  Nausea And Vomiting and Other (See Comments)    MYALGIAS   [place] [Pentazocine] Other (See Comments)    headache   Other Nausea Only    UNSPECIFIED Anesthesia   Social History   Occupational History   Occupation: Archivist  Tobacco Use   Smoking status: Never   Smokeless tobacco: Never   Tobacco comments:    Prior secondhand smoke  Vaping Use   Vaping Use: Never used  Substance and Sexual Activity   Alcohol use: Yes    Alcohol/week: 0.0 standard drinks of alcohol    Comment: Rarely   Drug use: No   Sexual activity: Not on file   Family History  Problem Relation Age of Onset   Lung cancer Father        smoked   Hypertension Father    Stroke Father    Heart failure Mother    Bone cancer Sister    Asthma Sister    Immunization History  Administered Date(s) Administered   Influenza Split 04/20/2014   Influenza, Quadrivalent, Recombinant, Inj, Pf 09/14/2020   Influenza,inj,Quad PF,6+ Mos 07/01/2019   Influenza-Unspecified 07/01/2019   PFIZER Comirnaty(Gray Top)Covid-19 Tri-Sucrose Vaccine 10/23/2020, 11/15/2020   PFIZER(Purple Top)SARS-COV-2 Vaccination 10/23/2020, 11/15/2020   Pneumococcal Polysaccharide-23 08/23/2018   Tdap 04/20/2014   Zoster Recombinat (Shingrix) 08/03/2021   Zoster, Live 04/20/2014     Review of Systems: Negative except as noted in the HPI.   Objective: There were no vitals filed for this visit.  Katelyn [name] is a pleasant 66 y.o. female in NAD. AAO X 3.  Vascular Examination: CFT <3 seconds b/l LE. Palpable DP/PT pulses b/l LE. Digital hair present b/l. Skin temperature gradient WNL b/l. No pain with calf compression b/l. No edema noted b/l. No cyanosis or clubbing noted b/l LE.  Dermatological Examination: No open wounds b/l LE. No interdigital macerations noted b/l LE. Toenails 1-5 bilaterally elongated, discolored, dystrophic, thickened, and crumbly with subungual debris and tenderness to dorsal palpation. Incurvated  nailplate both borders of left hallux.  Nail border hypertrophy absent. There is tenderness to palpation. Sign(s) of infection: no clinical signs of infection noted on examination today..  Neurological Examination: Protective sensation intact 5/5 intact bilaterally with 10g monofilament b/l. Vibratory sensation intact b/l.  Musculoskeletal Examination: Muscle strength 5/5 to all lower extremity muscle groups bilaterally. No pain, crepitus or joint limitation noted with ROM bilateral LE.  Footwear Assessment: Does the patient wear appropriate shoes? Yes. Does the patient need inserts/orthotics? No.  Assessment: 1. Pain due to onychomycosis of toenails of both feet   2. Ingrown toenail without infection   3. Type 2 diabetes mellitus with stage 3 chronic kidney disease, with long-term current use of insulin, unspecified whether stage 3a or 3b CKD ([place])   4. Encounter for diabetic foot exam ([place])      ADA Risk Categorization: Low Risk :  Patient has all of the following: Intact protective sensation No prior foot ulcer  No severe deformity Pedal pulses present  Plan: -Patient was evaluated and treated. All patient's and/or POA's questions/concerns answered on today's visit. -Diabetic foot examination performed today. -Patient to continue soft, supportive shoe gear daily. -Toenails 1-5 b/l were debrided in length and girth with sterile nail nippers and dremel without iatrogenic bleeding.  -Offending nail border debrided and curretaged both borders of left hallux utilizing sterile nail nipper and currette. Border(s) cleansed with alcohol and triple antibiotic ointment applied. Dispensed written instructions for once daily epsom salt soaks for 3-5  days. Call office if there are any concerns. -Patient scheduled to see Dr. Blenda Mounts in 3 weeks for follow up of ingrown toenail left great toe and follow up 3 months with me. -Patient/POA to call should there be question/concern in the  interim. Return in about 3 months (around 07/14/2022).  Marzetta Board, DPM

## 2022-05-01 ENCOUNTER — Encounter: Payer: Self-pay | Admitting: Podiatry

## 2022-05-01 ENCOUNTER — Ambulatory Visit (INDEPENDENT_AMBULATORY_CARE_PROVIDER_SITE_OTHER): Payer: PPO | Admitting: Podiatry

## 2022-05-01 DIAGNOSIS — Z794 Long term (current) use of insulin: Secondary | ICD-10-CM

## 2022-05-01 DIAGNOSIS — N183 Chronic kidney disease, stage 3 unspecified: Secondary | ICD-10-CM | POA: Diagnosis not present

## 2022-05-01 DIAGNOSIS — L600 Ingrowing nail: Secondary | ICD-10-CM

## 2022-05-01 DIAGNOSIS — E1122 Type 2 diabetes mellitus with diabetic chronic kidney disease: Secondary | ICD-10-CM | POA: Diagnosis not present

## 2022-05-01 NOTE — Progress Notes (Signed)
Subjective:  Patient ID: Katelyn [name], female    DOB: 1956-07-29,   MRN: [id number]  No chief complaint on file.   66 y.o. female presents for concern of ingrown nail on the left great toe. Was seen by Dr. Elisha Ponder a couple weeks ago and here for a toenail check.  . Denies any other pedal complaints. Denies n/v/f/c.   Past Medical History:  Diagnosis Date   Anxiety    Arthritis    Bursitis of right hip    CAD (coronary artery disease)    Cardiac catheterization June 2014 in [place] - 50% circumflex stenosis   Chest pain, neg MI, stable CAD non obstructive on cath 10/05/20 10/04/2020   Chronic diastolic heart failure ([place]) 08/20/2017   Chronic kidney disease, stage 3 ([place])    does not see nephrologist   Chronic low back pain without sciatica 03/14/2016   CKD (chronic kidney disease), stage III ([place]) 47/42/5956   Complication of anesthesia    Cough 04/19/2017   Overview:  Last Assessment & Plan:  Formatting of this note may be different from the original. Cough - ? ACE related with AR triggers   Plan  Patient Instructions  Discuss with your primary doctor that lisinopril pain, need making your cough worse. May use Mucinex DM twice daily as needed for cough and congestion Zyrtec 10 mg at bedtime as needed for drainage Saline nasal spray as needed. Lab tests today Activity as tolerated. Follow with Dr. [name] in 3-4 months and As needed   Please contact office for sooner follow up if symptoms do not improve or worsen or seek emergency care    Depression    Dyspnea    with exertion   " lazy lung" - per  [name] from back issues- 06/2016   Elevated liver enzymes 12/05/2016   Essential hypertension    GERD (gastroesophageal reflux disease)    Gout 03/14/2016   Greater trochanteric bursitis of right hip 02/02/2012   H/O hiatal hernia    Heart murmur    History of blood transfusion 2016   History of esophageal stricture 10/07/2020   History of kidney stones    Hypercholesterolemia     Hypertensive heart disease with heart failure ([place]) 01/01/2017   Hypoxia 10/07/2020   Iliotibial band syndrome of right side 02/02/2012   Iron deficiency anemia due to chronic blood loss 03/14/2016   LBBB (left bundle branch block) 01/01/2017   Left bundle branch block    Leg weakness 10/07/2020   Lumbar stenosis    Meralgia paraesthetica 12/05/2016   Mild CAD 11/24/2015   Morbid obesity ([place]) 10/07/2020   Neuropathy    OSA (obstructive sleep apnea) 05/24/2016   Overview:  Managed PULM   PONV (postoperative nausea and vomiting)    "no N/V with patch"   Restless leg syndrome    S/P lumbar laminectomy 11/26/2015   S/P lumbar spinal fusion 08/29/2016   Tinnitus 12/05/2016   Type 2 diabetes mellitus ([place])    UTI (urinary tract infection) 10/07/2020    Objective:  Physical Exam: Vascular: DP/PT pulses 2/4 bilateral. CFT <3 seconds. Absent hair growth on digits. Edema noted to bilateral lower extremities. Xerosis noted bilaterally.  Skin. No lacerations or abrasions bilateral feet. Nails 1-5 bilateral  are thickened discolored  with subungual debris. Left hallux nail is well healed. No erythema edema or purulence noted.  Musculoskeletal: MMT 5/5 bilateral lower extremities in DF, PF, Inversion and Eversion. Deceased ROM in DF of ankle joint.  Neurological: Sensation intact to light touch. Protective sensation diminished bilateral.    Assessment:   1. Ingrown toenail without infection   2. Type 2 diabetes mellitus with stage 3 chronic kidney disease, with long-term current use of insulin, unspecified whether stage 3a or 3b CKD ([place])      Plan:  Patient was evaluated and treated and all questions answered. Toe was evaluated and appears to be healing well.  May discontinue soaks and neosporin.  Did recommend soaking every now and then to help with any symptoms or to call if any redness swelling or drainage appears.  Patient to follow-up as needed.    Lorenda Peck, DPM

## 2022-05-02 DIAGNOSIS — M961 Postlaminectomy syndrome, not elsewhere classified: Secondary | ICD-10-CM | POA: Insufficient documentation

## 2022-05-02 DIAGNOSIS — M5415 Radiculopathy, thoracolumbar region: Secondary | ICD-10-CM | POA: Insufficient documentation

## 2022-05-02 DIAGNOSIS — E669 Obesity, unspecified: Secondary | ICD-10-CM | POA: Insufficient documentation

## 2022-05-02 DIAGNOSIS — Z79891 Long term (current) use of opiate analgesic: Secondary | ICD-10-CM | POA: Insufficient documentation

## 2022-05-02 DIAGNOSIS — Z0289 Encounter for other administrative examinations: Secondary | ICD-10-CM | POA: Insufficient documentation

## 2022-05-02 DIAGNOSIS — Z789 Other specified health status: Secondary | ICD-10-CM | POA: Insufficient documentation

## 2022-05-31 DIAGNOSIS — F1190 Opioid use, unspecified, uncomplicated: Secondary | ICD-10-CM | POA: Insufficient documentation

## 2022-06-21 ENCOUNTER — Telehealth: Payer: Self-pay

## 2022-06-21 NOTE — Telephone Encounter (Signed)
   Pre-operative Risk Assessment    Patient Name: Katelyn [name]  DOB: [birth year] MRN: [id number]      Request for Surgical Clearance    Procedure:   RIGHT KNEE ARTHROSCOPY  Date of Surgery:  Clearance 06/29/22                                 Surgeon:  Hessie Dibble, MD Surgeon's Group or Practice Name:  [facility] Phone number:  [phone] Fax number:  [phone]   Type of Clearance Requested:   - Medical    Type of Anesthesia:   Choice   Additional requests/questions:  Please fax a copy of preoperative risk assessment form with last office note, pertinent records to the surgeon's office.  Daylene Katayama   06/21/2022, 3:09 PM

## 2022-06-22 ENCOUNTER — Encounter: Payer: Self-pay | Admitting: Cardiology

## 2022-06-22 ENCOUNTER — Ambulatory Visit: Payer: PPO | Attending: Cardiology | Admitting: Cardiology

## 2022-06-22 VITALS — BP 138/70 | HR 95 | Ht 62.0 in | Wt 208.0 lb

## 2022-06-22 DIAGNOSIS — G4733 Obstructive sleep apnea (adult) (pediatric): Secondary | ICD-10-CM

## 2022-06-22 DIAGNOSIS — Z01810 Encounter for preprocedural cardiovascular examination: Secondary | ICD-10-CM

## 2022-06-22 DIAGNOSIS — I447 Left bundle-branch block, unspecified: Secondary | ICD-10-CM

## 2022-06-22 DIAGNOSIS — M47816 Spondylosis without myelopathy or radiculopathy, lumbar region: Secondary | ICD-10-CM | POA: Insufficient documentation

## 2022-06-22 DIAGNOSIS — Z79899 Other long term (current) drug therapy: Secondary | ICD-10-CM | POA: Insufficient documentation

## 2022-06-22 DIAGNOSIS — M546 Pain in thoracic spine: Secondary | ICD-10-CM | POA: Insufficient documentation

## 2022-06-22 DIAGNOSIS — M47814 Spondylosis without myelopathy or radiculopathy, thoracic region: Secondary | ICD-10-CM | POA: Insufficient documentation

## 2022-06-22 DIAGNOSIS — I2510 Atherosclerotic heart disease of native coronary artery without angina pectoris: Secondary | ICD-10-CM

## 2022-06-22 DIAGNOSIS — I110 Hypertensive heart disease with heart failure: Secondary | ICD-10-CM | POA: Diagnosis not present

## 2022-06-22 DIAGNOSIS — M461 Sacroiliitis, not elsewhere classified: Secondary | ICD-10-CM | POA: Insufficient documentation

## 2022-06-22 NOTE — Telephone Encounter (Signed)
   Name: Katelyn [name]  DOB: 11-25-1955  MRN: [id number]  Primary Cardiologist: Shirlee More, MD  Chart reviewed as part of pre-operative protocol coverage. Because of Katelyn [name]'s past medical history and time since last visit, she will require a follow-up in-office visit in order to better assess preoperative cardiovascular risk.  Given her chronic restrictive lung disease and SOB, virtual visit would be challenging to assess functional status to be able to adequately clear for surgery. Therefore recommend in-office visit.  Pre-op covering staff: - Please schedule appointment and call patient to inform them. If patient already had an upcoming appointment within acceptable timeframe, please add "pre-op clearance" to the appointment notes so provider is aware. - Please contact requesting surgeon's office via preferred method (i.e, phone, fax) to inform them of need for appointment prior to surgery.  Not listed on clearance of needing to hold any meds.  Charlie Pitter, PA-C  06/22/2022, 8:47 AM

## 2022-06-22 NOTE — Progress Notes (Signed)
Cardiology Office Note:    Date:  06/22/2022   ID:  Katelyn [name], DOB 20-Dec-1955, MRN [id number]  PCP:  Algis Greenhouse, MD  Cardiologist:  Shirlee More, MD    Referring MD: Algis Greenhouse, MD    ASSESSMENT:    1. Preoperative cardiovascular examination   2. Hypertensive heart disease with heart failure ([place] )   3. LBBB (left bundle branch block)   4. Mild CAD   5. OSA (obstructive sleep apnea)    PLAN:    In order of problems listed above:  Her procedure is elective low to intermediate risk her heart problems include mild nonobstructive CAD hypertensive heart disease left bundle branch block and obstructive sleep apnea.  From my perspective she is optimized for the planned procedure my only concern is that she is no longer using positive pressure ventilation she will need to be watched longer in the recovery room with her sleep apnea.  I told her she can stop her aspirin if her directed resume 48 hours afterwards and hold her diuretic the day of surgery anticipated to be outpatient I think most of her edema is due to sleep apnea she will continue her diuretics Stable mild nonobstructive CAD see below for records including heart catheterization   Next appointment: 6 months   Medication Adjustments/Labs and Tests Ordered: Current medicines are reviewed at length with the patient today.  Concerns regarding medicines are outlined above.  No orders of the defined types were placed in this encounter.  No orders of the defined types were placed in this encounter.   Chief Complaint  Patient presents with   Pre-op Exam    History of Present Illness:    Katelyn [name] is a 66 y.o. female with a hx of  hypertension mild CAD dyslipidemia left bundle branch block with an ejection fraction of 55 to 60% restrictive lung disease with shortness of breath due to deconditioning, obesity and statin intolerance  last seen 11/16/2021.  She was seen by pulmonary at 11/14/2021 for severe  obstructive sleep apnea is treated with BiPAP and oxygen and notation that she had COVID-19 infection in November. She underwent repeat coronary angiography 10/05/2020 which showed mild nonobstructive CAD low normal ejection fraction estimated 50% elevated filling pressure mild elevation of pulmonary artery pressure and right heart pressures. Echocardiogram done at [facility] 10/06/2020 showed EF normal at 50 to 55% mild LVH indeterminate diastolic pressure normal right heart function and no significant valvular abnormality.  Request for Surgical Clearance     Procedure:   RIGHT KNEE ARTHROSCOPY   Date of Surgery:  Clearance 06/29/22                                 Surgeon:  Hessie Dibble, MD Surgeon's Group or Practice Name:  [facility] Phone number:  [phone] Fax number:  [phone]   Type of Clearance Requested:   - Medical    Type of Anesthesia:   Choice  Compliance with diet, lifestyle and medications: Yes She no longer uses nocturnal oxygen or positive pressure CPAP She is not having chest pain shortness of breath palpitation or syncope She does have edema and is due to her sleep apnea She need to be watched longer in the recovery room. She can discontinue aspirin if directed preoperatively Her exercise tolerance is greater than 4 METS The surgical procedure is elective low to intermediate  risk Past Medical History:  Diagnosis Date   Anxiety    Arthritis    Bursitis of right hip    CAD (coronary artery disease)    Cardiac catheterization June 2014 in [place] - 50% circumflex stenosis   Chest pain, neg MI, stable CAD non obstructive on cath 10/05/20 10/04/2020   Chronic diastolic heart failure ([place]) 08/20/2017   Chronic kidney disease, stage 3 ([place])    does not see nephrologist   Chronic low back pain without sciatica 03/14/2016   CKD (chronic kidney disease), stage III ([place]) 03/47/4259   Complication of anesthesia     Cough 04/19/2017   Overview:  Last Assessment & Plan:  Formatting of this note may be different from the original. Cough - ? ACE related with AR triggers   Plan  Patient Instructions  Discuss with your primary doctor that lisinopril pain, need making your cough worse. May use Mucinex DM twice daily as needed for cough and congestion Zyrtec 10 mg at bedtime as needed for drainage Saline nasal spray as needed. Lab tests today Activity as tolerated. Follow with Dr. [name] in 3-4 months and As needed   Please contact office for sooner follow up if symptoms do not improve or worsen or seek emergency care    Depression    Dyspnea    with exertion   " lazy lung" - per  [name] from back issues- 06/2016   Elevated liver enzymes 12/05/2016   Essential hypertension    GERD (gastroesophageal reflux disease)    Gout 03/14/2016   Greater trochanteric bursitis of right hip 02/02/2012   H/O hiatal hernia    Heart murmur    History of blood transfusion 2016   History of esophageal stricture 10/07/2020   History of kidney stones    Hypercholesterolemia    Hypertensive heart disease with heart failure ([place]) 01/01/2017   Hypoxia 10/07/2020   Iliotibial band syndrome of right side 02/02/2012   Iron deficiency anemia due to chronic blood loss 03/14/2016   LBBB (left bundle branch block) 01/01/2017   Left bundle branch block    Leg weakness 10/07/2020   Lumbar stenosis    Meralgia paraesthetica 12/05/2016   Mild CAD 11/24/2015   Morbid obesity ([place]) 10/07/2020   Neuropathy    OSA (obstructive sleep apnea) 05/24/2016   Overview:  Managed PULM   PONV (postoperative nausea and vomiting)    "no N/V with patch"   Restless leg syndrome    S/P lumbar laminectomy 11/26/2015   S/P lumbar spinal fusion 08/29/2016   Tinnitus 12/05/2016   Type 2 diabetes mellitus ([place])    UTI (urinary tract infection) 10/07/2020    Past Surgical History:  Procedure Laterality Date   ABDOMINAL HYSTERECTOMY  1983   APPENDECTOMY  Age 7   [place] 2012 AND FUSION WITH INSTRUMENTATION SEPT 2012   CARDIAC CATHETERIZATION     x 2   CARPAL TUNNEL RELEASE     bil   CHOLECYSTECTOMY  1990's   COLONOSCOPY  12/12/2017   Colonic polyp status post polypectomy. Mild sigmoid diverticulosis. Otherwise normal colonoscopy to terminal ileum.   CYSTO EXTRACTION KIDNEY STONES     ESOPHAGOGASTRODUODENOSCOPY  12/12/2017   Small hiatal hernia. Mild gastritis. Status post esophageal dilatation.   EXCISION/RELEASE BURSA HIP  02/02/2012   Procedure: EXCISION/RELEASE BURSA HIP;  Surgeon: Tobi Bastos, MD;  Location: [facility];  Service: Orthopedics;  Laterality: Right;  Right  Hip Bursectomy   EYE SURGERY Bilateral    cataracts   KIDNEY STONE SURGERY  2008   Left knee surgery x 2  1996   reconstruction   LUMBAR DISC SURGERY  08/2016   LUMBAR LAMINECTOMY/DECOMPRESSION MICRODISCECTOMY Right 11/26/2015   Procedure: Extraforaminal Microdiscectomy  - Lumbar two-three- right;  Surgeon: Eustace Moore, MD;  Location: [facility];  Service: Neurosurgery;  Laterality: Right;  right    LUMBAR WOUND DEBRIDEMENT N/A 10/25/2016   Procedure: Lumbar wound revision;  Surgeon: Eustace Moore, MD;  Location: [facility];  Service: Neurosurgery;  Laterality: N/A;  Lumbar wound revision   Right shoulder surgery  2010   spur   RIGHT/LEFT HEART CATH AND CORONARY ANGIOGRAPHY N/A 10/05/2020   Procedure: RIGHT/LEFT HEART CATH AND CORONARY ANGIOGRAPHY;  Surgeon: Martinique, Peter M, MD;  Location: [facility];  Service: Cardiovascular;  Laterality: N/A;    Current Medications: Current Meds  Medication Sig   acetaminophen (TYLENOL) 325 MG tablet Take 2 tablets (650 mg total) by mouth every 4 (four) hours as needed for headache or mild pain.   allopurinol (ZYLOPRIM) 100 MG tablet Take 100 mg by mouth at bedtime.   aspirin EC 81 MG tablet Take 81 mg by mouth at bedtime. Swallow whole.   carvedilol (COREG) 6.25 MG tablet Take 1 tablet  (6.25 mg total) by mouth 2 (two) times daily. Take 6.25 mg by mouth 2 (two) times daily. / Needs appointment for further refills   cyclobenzaprine (FLEXERIL) 5 MG tablet Take 5 mg by mouth 3 (three) times daily as needed for muscle spasms.   DULoxetine (CYMBALTA) 60 MG capsule Take 60 mg by mouth at bedtime.    GLUCOSAMINE-CHONDROITIN PO Take 1 tablet by mouth 2 (two) times daily.   metFORMIN (GLUCOPHAGE-XR) 500 MG 24 hr tablet Take 500 mg by mouth 2 (two) times daily.   mirtazapine (REMERON) 15 MG tablet Take 15 mg by mouth at bedtime.   MOUNJARO 5 MG/0.5ML Pen Inject 5 mg into the skin once a week.   Multiple Vitamin (MULTIVITAMIN WITH MINERALS) TABS tablet Take 1 tablet by mouth daily. Centrum Silver Women   Multiple Vitamins-Minerals (HAIR SKIN AND NAILS FORMULA) TABS Take 1 tablet by mouth daily.   nitroGLYCERIN (NITROSTAT) 0.4 MG SL tablet Place 0.4 mg under the tongue every 5 (five) minutes as needed for chest pain.    NOVOLIN 70/30 (70-30) 100 UNIT/ML injection Inject 25-30 Units into the skin 3 (three) times daily.   NOVOLOG FLEXPEN 100 UNIT/ML FlexPen Inject 55 Units into the skin 3 (three) times daily with meals as needed for high blood sugar. As needed for blood sugar over 150. Add 1 unit for every point over 150   pantoprazole (PROTONIX) 40 MG tablet Take 1 tablet by mouth once daily   pramipexole (MIRAPEX) 1 MG tablet Take 1 mg by mouth at bedtime.   pregabalin (LYRICA) 75 MG capsule Take 75 mg by mouth at bedtime.   rosuvastatin (CRESTOR) 10 MG tablet Take 1 tablet (10 mg total) by mouth daily.   torsemide (DEMADEX) 20 MG tablet Take one tablet by mouth once daily on Sunday, Tuesday, Thursday, and Saturday. Then take one tablet by mouth twice daily on Monday, Wednesday, and Friday.   TRESIBA FLEXTOUCH 200 UNIT/ML FlexTouch Pen Inject 110 Units into the skin daily.   valACYclovir (VALTREX) 1000 MG tablet Take 1,000 mg by mouth See admin instructions. Take one tablet (1000 mg) by mouth  twice daily for 2  days, then take one tablet (1000 mg) daily for 2 days - as needed for fever blisters   Current Facility-Administered Medications for the 06/22/22 encounter (Office Visit) with Richardo Priest, MD  Medication   triamcinolone acetonide (KENALOG) 10 MG/ML injection 10 mg   triamcinolone acetonide (KENALOG) 10 MG/ML injection 10 mg     Allergies:   Atorvastatin, Talwin [pentazocine], and Other   Social History   Socioeconomic History   Marital status: Married    Spouse name: Not on file   Number of children: Not on file   Years of education: Not on file   Highest education level: Not on file  Occupational History   Occupation: Archivist  Tobacco Use   Smoking status: Never   Smokeless tobacco: Never   Tobacco comments:    Prior secondhand smoke  Vaping Use   Vaping Use: Never used  Substance and Sexual Activity   Alcohol use: Yes    Alcohol/week: 0.0 standard drinks of alcohol    Comment: Rarely   Drug use: No   Sexual activity: Not on file  Other Topics Concern   Not on file  Social History Narrative   Not on file   Social Determinants of Health   Financial Resource Strain: Not on file  Food Insecurity: Not on file  Transportation Needs: Not on file  Physical Activity: Not on file  Stress: Not on file  Social Connections: Not on file     Family History: The patient's family history includes Asthma in her sister; Bone cancer in her sister; Heart failure in her mother; Hypertension in her father; Lung cancer in her father; Stroke in her father. ROS:   Please see the history of present illness.    All other systems reviewed and are negative.  EKGs/Labs/Other Studies Reviewed:    The following studies were reviewed today:  EKG:  EKG ordered today and personally reviewed.  The ekg ordered today demonstrates sinus rhythm left bundle branch block Similar to 11/16/2021 Recent Labs: 11/16/2021: ALT 46; BUN 19; Creatinine, Ser 1.22;  NT-Pro BNP 114; Potassium 4.7; Sodium 140  Recent Lipid Panel    Component Value Date/Time   CHOL 178 11/16/2021 1141   TRIG 132 11/16/2021 1141   HDL 51 11/16/2021 1141   CHOLHDL 3.5 11/16/2021 1141   CHOLHDL 4.2 10/05/2020 0228   VLDL 26 10/05/2020 0228   LDLCALC 104 (H) 11/16/2021 1141    Physical Exam:    VS:  BP 138/70   Pulse 95   Ht '5\' 2"'$  (1.575 m)   Wt 208 lb (94.3 kg)   SpO2 95%   BMI 38.04 kg/m     Wt Readings from Last 3 Encounters:  06/22/22 208 lb (94.3 kg)  12/15/21 220 lb (99.8 kg)  11/16/21 221 lb (100.2 kg)     GEN:  Well nourished, well developed in no acute distress HEENT: Normal NECK: No JVD; No carotid bruits LYMPHATICS: No lymphadenopathy CARDIAC: S2 is paradoxical RRR, no murmurs, rubs, gallops RESPIRATORY:  Clear to auscultation without rales, wheezing or rhonchi  ABDOMEN: Soft, non-tender, non-distended MUSCULOSKELETAL: She has bilateral lower extremity and I think edema; No deformity  SKIN: Warm and dry NEUROLOGIC:  Alert and oriented x 3 PSYCHIATRIC:  Normal affect    Signed, Shirlee More, MD  06/22/2022 11:02 AM    [place]

## 2022-06-22 NOTE — Telephone Encounter (Signed)
Pt has surgery 06/29/22. Pre op clearance needed. Pt scheduled to see Dr. Bettina Gavia today at 10:20. Pt thanked me for the call and the help. I will forward notes to MD for upcoming appt today. Will send FYI to requesting office the pt has appt today with Dr. Bettina Gavia

## 2022-06-22 NOTE — Patient Instructions (Signed)
Medication Instructions:  Your physician recommends that you continue on your current medications as directed. Please refer to the Current Medication list given to you today.  *If you need a refill on your cardiac medications before your next appointment, please call your pharmacy*   Lab Work: None If you have labs (blood work) drawn today and your tests are completely normal, you will receive your results only by: [place] (if you have MyChart) OR A paper copy in the mail If you have any lab test that is abnormal or we need to change your treatment, we will call you to review the results.   Testing/Procedures: None   Follow-Up: At [facility], you and your health needs are our priority.  As part of our continuing mission to provide you with exceptional heart care, we have created designated Provider Care Teams.  These Care Teams include your primary Cardiologist (physician) and Advanced Practice Providers (APPs -  Physician Assistants and Nurse Practitioners) who all work together to provide you with the care you need, when you need it.  We recommend signing up for the patient portal called "MyChart".  Sign up information is provided on this After Visit Summary.  MyChart is used to connect with patients for Virtual Visits (Telemedicine).  Patients are able to view lab/test results, encounter notes, upcoming appointments, etc.  Non-urgent messages can be sent to your provider as well.   To learn more about what you can do with MyChart, go to [email or web address].    Your next appointment:   6 month(s)  The format for your next appointment:   In Person  Provider:   Shirlee More, MD    Other Instructions Stop aspirin if directed by surgery  Important Information About Sugar

## 2022-07-30 ENCOUNTER — Other Ambulatory Visit: Payer: Self-pay | Admitting: Cardiology

## 2022-08-10 ENCOUNTER — Ambulatory Visit: Payer: PPO | Admitting: Podiatry

## 2022-08-10 DIAGNOSIS — E1122 Type 2 diabetes mellitus with diabetic chronic kidney disease: Secondary | ICD-10-CM | POA: Diagnosis not present

## 2022-08-10 DIAGNOSIS — M79675 Pain in left toe(s): Secondary | ICD-10-CM

## 2022-08-10 DIAGNOSIS — N183 Chronic kidney disease, stage 3 unspecified: Secondary | ICD-10-CM

## 2022-08-10 DIAGNOSIS — Z794 Long term (current) use of insulin: Secondary | ICD-10-CM

## 2022-08-10 DIAGNOSIS — B351 Tinea unguium: Secondary | ICD-10-CM | POA: Diagnosis not present

## 2022-08-10 DIAGNOSIS — M79674 Pain in right toe(s): Secondary | ICD-10-CM

## 2022-08-14 ENCOUNTER — Encounter: Payer: Self-pay | Admitting: Podiatry

## 2022-08-14 NOTE — Progress Notes (Signed)
  Subjective:  Patient ID: Katelyn [name], female    DOB: 1956-01-05,  MRN: [id number]  Katelyn [name] presents to clinic today for at risk foot care. Pt has h/o NIDDM with chronic kidney disease and painful thick toenails that are difficult to trim. Pain interferes with ambulation. Aggravating factors include wearing enclosed shoe gear. Pain is relieved with periodic professional debridement.   New problem(s): None.   PCP is Dough, Jaymes Graff, MD.  Allergies  Allergen Reactions   Atorvastatin Nausea And Vomiting and Other (See Comments)    MYALGIAS   Talwin [Pentazocine] Other (See Comments)    headache   Other Nausea Only    UNSPECIFIED Anesthesia    Review of Systems: Negative except as noted in the HPI.  Objective: No changes noted in today's physical examination.  Katelyn [name] is a pleasant 66 y.o. female WD, WN in NAD. AAO x 3.  Vascular Examination: CFT <3 seconds b/l LE. Palpable DP/PT pulses b/l LE. Digital hair present b/l. Skin temperature gradient WNL b/l. No pain with calf compression b/l. No edema noted b/l. No cyanosis or clubbing noted b/l LE.  Dermatological Examination: No open wounds b/l LE. No interdigital macerations noted b/l LE. Toenails 1-5 bilaterally elongated, discolored, dystrophic, thickened, and crumbly with subungual debris and tenderness to dorsal palpation. Incurvated nailplate both borders of left hallux.  Nail border hypertrophy absent. There is tenderness to palpation. Sign(s) of infection: no clinical signs of infection noted on examination today..  Neurological Examination: Protective sensation intact 5/5 intact bilaterally with 10g monofilament b/l. Vibratory sensation intact b/l.  Musculoskeletal Examination: Muscle strength 5/5 to all lower extremity muscle groups bilaterally. No pain, crepitus or joint limitation noted with ROM bilateral LE.  Assessment/Plan: 1. Pain due to onychomycosis of toenails of both feet   2. Type 2 diabetes mellitus with  stage 3 chronic kidney disease, with long-term current use of insulin, unspecified whether stage 3a or 3b CKD ([place])     No orders of the defined types were placed in this encounter.   -Consent given for treatment as described below: -Examined patient. -Continue supportive shoe gear daily. -Mycotic toenails 1-5 bilaterally were debrided in length and girth with sterile nail nippers and dremel without incident. -Patient/POA to call should there be question/concern in the interim.   Return in about 3 months (around 11/10/2022).  Marzetta Board, DPM

## 2022-09-07 ENCOUNTER — Telehealth: Payer: Self-pay | Admitting: *Deleted

## 2022-09-07 ENCOUNTER — Telehealth: Payer: Self-pay

## 2022-09-07 NOTE — Telephone Encounter (Signed)
  Patient Consent for Virtual Visit         Katelyn [name] has provided verbal consent on 09/07/2022 for a virtual visit (video or telephone).   CONSENT FOR VIRTUAL VISIT FOR:  Katelyn [name]  By participating in this virtual visit I agree to the following:  I hereby voluntarily request, consent and authorize [facility] and its employed or contracted physicians, physician assistants, nurse practitioners or other licensed health care professionals (the Practitioner), to provide me with telemedicine health care services (the "Services") as deemed necessary by the treating Practitioner. I acknowledge and consent to receive the Services by the Practitioner via telemedicine. I understand that the telemedicine visit will involve communicating with the Practitioner through live audiovisual communication technology and the disclosure of certain medical information by electronic transmission. I acknowledge that I have been given the opportunity to request an in-person assessment or other available alternative prior to the telemedicine visit and am voluntarily participating in the telemedicine visit.  I understand that I have the right to withhold or withdraw my consent to the use of telemedicine in the course of my care at any time, without affecting my right to future care or treatment, and that the Practitioner or I may terminate the telemedicine visit at any time. I understand that I have the right to inspect all information obtained and/or recorded in the course of the telemedicine visit and may receive copies of available information for a reasonable fee.  I understand that some of the potential risks of receiving the Services via telemedicine include:  Delay or interruption in medical evaluation due to technological equipment failure or disruption; Information transmitted may not be sufficient (e.g. poor resolution of images) to allow for appropriate medical decision making by the Practitioner;  and/or  In rare instances, security protocols could fail, causing a breach of personal health information.  Furthermore, I acknowledge that it is my responsibility to provide information about my medical history, conditions and care that is complete and accurate to the best of my ability. I acknowledge that Practitioner's advice, recommendations, and/or decision may be based on factors not within their control, such as incomplete or inaccurate data provided by me or distortions of diagnostic images or specimens that may result from electronic transmissions. I understand that the practice of medicine is not an exact science and that Practitioner makes no warranties or guarantees regarding treatment outcomes. I acknowledge that a copy of this consent can be made available to me via my patient portal ([facility]), or I can request a printed copy by calling the office of [facility].    I understand that my insurance will be billed for this visit.   I have read or had this consent read to me. I understand the contents of this consent, which adequately explains the benefits and risks of the Services being provided via telemedicine.  I have been provided ample opportunity to ask questions regarding this consent and the Services and have had my questions answered to my satisfaction. I give my informed consent for the services to be provided through the use of telemedicine in my medical care

## 2022-09-07 NOTE — Telephone Encounter (Signed)
error 

## 2022-09-07 NOTE — Telephone Encounter (Signed)
Pt is set up for telephone clearance.  Med rec and consent done.

## 2022-09-07 NOTE — Telephone Encounter (Signed)
   Pre-operative Risk Assessment    Patient Name: Katelyn [name]  DOB: [birth year] MRN: [id number]      Request for Surgical Clearance    Procedure:   Spinal Cord Stimulator Placement  Date of Surgery:  Clearance 09/15/22                                 Surgeon:  Eustace Moore Surgeon's Group or Practice Name:  [facility] Phone number:  [phone] Fax number:  [phone]   Type of Clearance Requested:   - Medical Both Medical and Pharmacy   Type of Anesthesia:  General    Additional requests/questions:  Please fax a copy of signed form and any office notes  to the surgeon's office. Copy of form in chart media  Signed, Toni Arthurs   09/07/2022, 7:43 AM

## 2022-09-07 NOTE — Telephone Encounter (Signed)
   Name: Katelyn [name]  DOB: [birth year]  MRN: [id number]  Primary Cardiologist: Shirlee More, MD   Preoperative team, please contact this patient and set up a phone call appointment for further preoperative risk assessment. Please obtain consent and complete medication review. Thank you for your help.  I confirm that guidance regarding antiplatelet and oral anticoagulation therapy has been completed and, if necessary, noted below.  Her aspirin may be held for 5-7 days prior to her procedure.  Please resume as soon as deemed safe by surgeon.   Deberah Pelton, NP 09/07/2022, 1:24 PM [place]

## 2022-09-11 ENCOUNTER — Ambulatory Visit: Payer: PPO | Attending: Cardiology | Admitting: Physician Assistant

## 2022-09-11 DIAGNOSIS — Z01810 Encounter for preprocedural cardiovascular examination: Secondary | ICD-10-CM

## 2022-09-11 NOTE — Progress Notes (Signed)
Virtual Visit via Telephone Note   Because of Katelyn [name]'s co-morbid illnesses, she is at least at moderate risk for complications without adequate follow up.  This format is felt to be most appropriate for this patient at this time.  The patient did not have access to video technology/had technical difficulties with video requiring transitioning to audio format only (telephone).  All issues noted in this document were discussed and addressed.  No physical exam could be performed with this format.  Please refer to the patient's chart for her consent to telehealth for [facility].  Evaluation Performed:  Preoperative cardiovascular risk assessment _____________   Date:  09/11/2022   Patient ID:  Katelyn [name], DOB [birth year], MRN [id number] Patient Location:  Home Provider location:   Office  Primary Care Provider:  Algis Greenhouse, MD Primary Cardiologist:  Katelyn More, MD  Chief Complaint / Patient Profile   66 y.o. y/o female with a h/o nonobstructive CAD (last cath 09/2020), LBBB, severe OSA seen by pulmonology, HTN, dyslipidemia, restrictive lung disease with deconditioning, who is pending Spinal Cord Stimulator Placement and presents today for telephonic preoperative cardiovascular risk assessment.  History of Present Illness    Katelyn [name] is a 66 y.o. female who presents via audio/video conferencing for a telehealth visit today.  Pt was last seen in cardiology clinic on 06/22/22 by Katelyn [name].  At that time Katelyn [name] was doing well, felt to be stable for unrelated knee surgery at the time. Her edema was felt due to her OSA. Last cath 09/2020 with nonobstructive CAD, managed medically. Last echo 09/2020 EF 55-60%, mild LVH, no significant valve disease. The patient is now pending procedure as outlined above. Since her last visit, she reports she has done well. She reports knee surgery went well in October and she has not had any chest pain or SOB. She has been back to  activity and has no anginal symptoms with exertion.  Past Medical History    Past Medical History:  Diagnosis Date   Anxiety    Arthritis    Bursitis of right hip    CAD (coronary artery disease)    Cardiac catheterization June 2014 in [place] - 50% circumflex stenosis   Chest pain, neg MI, stable CAD non obstructive on cath 10/05/20 10/04/2020   Chronic diastolic heart failure ([place]) 08/20/2017   Chronic kidney disease, stage 3 ([place])    does not see nephrologist   Chronic low back pain without sciatica 03/14/2016   CKD (chronic kidney disease), stage III ([place]) 40/98/1191   Complication of anesthesia    Cough 04/19/2017   Overview:  Last Assessment & Plan:  Formatting of this note may be different from the original. Cough - ? ACE related with AR triggers   Plan  Patient Instructions  Discuss with your primary doctor that lisinopril pain, need making your cough worse. May use Mucinex DM twice daily as needed for cough and congestion Zyrtec 10 mg at bedtime as needed for drainage Saline nasal spray as needed. Lab tests today Activity as tolerated. Follow with Dr. [name] in 3-4 months and As needed   Please contact office for sooner follow up if symptoms do not improve or worsen or seek emergency care    Depression    Dyspnea    with exertion   " lazy lung" - per  [name] from back issues- 06/2016   Elevated liver enzymes 12/05/2016   Essential hypertension  GERD (gastroesophageal reflux disease)    Gout 03/14/2016   Greater trochanteric bursitis of right hip 02/02/2012   H/O hiatal hernia    Heart murmur    History of blood transfusion 2016   History of esophageal stricture 10/07/2020   History of kidney stones    Hypercholesterolemia    Hypertensive heart disease with heart failure ([place]) 01/01/2017   Hypoxia 10/07/2020   Iliotibial band syndrome of right side 02/02/2012   Iron deficiency anemia due to chronic blood loss 03/14/2016   LBBB (left bundle branch block) 01/01/2017   Left bundle  branch block    Leg weakness 10/07/2020   Lumbar stenosis    Meralgia paraesthetica 12/05/2016   Mild CAD 11/24/2015   Morbid obesity ([place]) 10/07/2020   Neuropathy    OSA (obstructive sleep apnea) 05/24/2016   Overview:  Managed PULM   PONV (postoperative nausea and vomiting)    "no N/V with patch"   Restless leg syndrome    S/P lumbar laminectomy 11/26/2015   S/P lumbar spinal fusion 08/29/2016   Tinnitus 12/05/2016   Type 2 diabetes mellitus ([place])    UTI (urinary tract infection) 10/07/2020   Past Surgical History:  Procedure Laterality Date   ABDOMINAL HYSTERECTOMY  1983   APPENDECTOMY  Age 80   BACK SURGERY     FIRST LUMBAR FUSION/ SURGERY APRIL 2012 AND FUSION WITH INSTRUMENTATION SEPT 2012   CARDIAC CATHETERIZATION     x 2   CARPAL TUNNEL RELEASE     bil   CHOLECYSTECTOMY  1990's   COLONOSCOPY  12/12/2017   Colonic polyp status post polypectomy. Mild sigmoid diverticulosis. Otherwise normal colonoscopy to terminal ileum.   CYSTO EXTRACTION KIDNEY STONES     ESOPHAGOGASTRODUODENOSCOPY  12/12/2017   Small hiatal hernia. Mild gastritis. Status post esophageal dilatation.   EXCISION/RELEASE BURSA HIP  02/02/2012   Procedure: EXCISION/RELEASE BURSA HIP;  Surgeon: Tobi Bastos, MD;  Location: [facility];  Service: Orthopedics;  Laterality: Right;  Right Hip Bursectomy   EYE SURGERY Bilateral    cataracts   KIDNEY STONE SURGERY  2008   Left knee surgery x 2  1996   reconstruction   LUMBAR DISC SURGERY  08/2016   LUMBAR LAMINECTOMY/DECOMPRESSION MICRODISCECTOMY Right 11/26/2015   Procedure: Extraforaminal Microdiscectomy  - Lumbar two-three- right;  Surgeon: Eustace Moore, MD;  Location: [facility];  Service: Neurosurgery;  Laterality: Right;  right    LUMBAR WOUND DEBRIDEMENT N/A 10/25/2016   Procedure: Lumbar wound revision;  Surgeon: Eustace Moore, MD;  Location: [facility];  Service: Neurosurgery;  Laterality: N/A;  Lumbar wound revision   Right shoulder surgery  2010   spur    RIGHT/LEFT HEART CATH AND CORONARY ANGIOGRAPHY N/A 10/05/2020   Procedure: RIGHT/LEFT HEART CATH AND CORONARY ANGIOGRAPHY;  Surgeon: Martinique, Peter M, MD;  Location: [facility];  Service: Cardiovascular;  Laterality: N/A;    Allergies  Allergies  Allergen Reactions   Atorvastatin Nausea And Vomiting and Other (See Comments)    MYALGIAS   [place] [Pentazocine] Other (See Comments)    headache   Other Nausea Only    UNSPECIFIED Anesthesia    Home Medications    Prior to Admission medications   Medication Sig Start Date End Date Taking? Authorizing Provider  aspirin EC 81 MG tablet Take 81 mg by mouth daily. **presently on hold for surgery**   Yes [provider]  acetaminophen (TYLENOL) 325 MG tablet Take 2 tablets (650 mg total) by mouth  every 4 (four) hours as needed for headache or mild pain. 10/07/20   Isaiah Serge, NP  allopurinol (ZYLOPRIM) 100 MG tablet Take 100 mg by mouth at bedtime. 10/20/19   [provider]  carvedilol (COREG) 6.25 MG tablet Take 1 tablet (6.25 mg total) by mouth 2 (two) times daily. Take 6.25 mg by mouth 2 (two) times daily. / Needs appointment for further refills 04/27/21   Richardo Priest, MD  cyclobenzaprine (FLEXERIL) 5 MG tablet Take 5 mg by mouth 3 (three) times daily as needed for muscle spasms. 06/01/22   [provider]  DULoxetine (CYMBALTA) 60 MG capsule Take 60 mg by mouth at bedtime.     [provider]  GLUCOSAMINE-CHONDROITIN PO Take 1 tablet by mouth 2 (two) times daily.    [provider]  metFORMIN (GLUCOPHAGE-XR) 500 MG 24 hr tablet Take 500 mg by mouth 2 (two) times daily. 11/11/21   [provider]  mirtazapine (REMERON) 15 MG tablet Take 15 mg by mouth at bedtime.    [provider]  MOUNJARO 5 MG/0.5ML Pen Inject 5 mg into the skin once a week. 05/17/22   [provider]  Multiple Vitamin (MULTIVITAMIN WITH MINERALS) TABS tablet Take 1 tablet by mouth daily.  Centrum Silver Women    [provider]  Multiple Vitamins-Minerals (HAIR SKIN AND NAILS FORMULA) TABS Take 1 tablet by mouth daily. 10/17/18   [provider]  nitroGLYCERIN (NITROSTAT) 0.4 MG SL tablet Place 0.4 mg under the tongue every 5 (five) minutes as needed for chest pain.     [provider]  NOVOLIN 70/30 (70-30) 100 UNIT/ML injection Inject 25-30 Units into the skin 3 (three) times daily. 03/14/22   [provider]  NOVOLOG FLEXPEN 100 UNIT/ML FlexPen Inject 55 Units into the skin 3 (three) times daily with meals as needed for high blood sugar. As needed for blood sugar over 150. Add 1 unit for every point over 150 11/15/21   [provider]  pantoprazole (PROTONIX) 40 MG tablet Take 1 tablet by mouth once daily 07/31/22   Richardo Priest, MD  pramipexole (MIRAPEX) 1 MG tablet Take 1 mg by mouth at bedtime.    [provider]  pregabalin (LYRICA) 75 MG capsule Take 75 mg by mouth at bedtime. 09/24/21   [provider]  rosuvastatin (CRESTOR) 10 MG tablet Take 10 mg by mouth daily.    [provider]  torsemide (DEMADEX) 20 MG tablet Take one tablet by mouth once daily on Sunday, Tuesday, Thursday, and Saturday. Then take one tablet by mouth twice daily on Monday, Wednesday, and Friday. 11/24/21   Richardo Priest, MD  TRESIBA FLEXTOUCH 200 UNIT/ML FlexTouch Pen Inject 110 Units into the skin daily. 11/15/21   [provider]  valACYclovir (VALTREX) 1000 MG tablet Take 1,000 mg by mouth See admin instructions. Take one tablet (1000 mg) by mouth twice daily for 2 days, then take one tablet (1000 mg) daily for 2 days - as needed for fever blisters    [provider]     Physical Exam    Vital Signs:  Anayansi Rundquist Boulet does not have vital signs available for review today.  Given telephonic nature of communication, physical exam is limited. AAOx3. NAD. Normal affect.  Speech and respirations are  unlabored.  Accessory Clinical Findings    None  Assessment & Plan    1.  Preoperative Cardiovascular Risk Assessment: RCRI 0.9% indicating low CV risk.  The patient affirms she has been doing well without any new cardiac symptoms. They are able to achieve over 4 METS without cardiac limitations. Therefore, based on ACC/AHA guidelines, the patient would be at acceptable risk for the planned procedure without further cardiovascular testing. The patient was advised that if she develops new symptoms prior to surgery to contact our office to arrange for a follow-up visit, and she verbalized understanding.  Per Dr. Joya Gaskins prior advisement, she can hold aspirin if needed for the procedure as requested by surgeon. She states she has already been holding this the last few weeks as a holdover from her knee surgery. I told her we typically advise that aspirin be resumed when felt safe by surgeon. I did add back to her medicine list with the information that she is presently holding this, so that it can be restarted when surgeon feels safe. She verbalized understanding and gratitude.  A copy of this note will be routed to requesting surgeon.  Time:   Today, I have spent 5 minutes with the patient with telehealth technology discussing medical history, symptoms, and management plan.     Charlie Pitter, PA-C  09/11/2022, 2:59 PM

## 2022-10-31 ENCOUNTER — Other Ambulatory Visit: Payer: Self-pay

## 2022-10-31 MED ORDER — TORSEMIDE 20 MG PO TABS: ORAL_TABLET | ORAL | 1 refills | Status: DC

## 2022-11-22 DIAGNOSIS — Z9689 Presence of other specified functional implants: Secondary | ICD-10-CM | POA: Insufficient documentation

## 2022-11-23 ENCOUNTER — Ambulatory Visit: Payer: PPO | Admitting: Podiatry

## 2022-12-01 ENCOUNTER — Other Ambulatory Visit (HOSPITAL_COMMUNITY): Payer: Self-pay | Admitting: Student

## 2022-12-01 DIAGNOSIS — M8610 Other acute osteomyelitis, unspecified site: Secondary | ICD-10-CM

## 2022-12-05 ENCOUNTER — Other Ambulatory Visit: Payer: Self-pay

## 2022-12-05 ENCOUNTER — Ambulatory Visit (HOSPITAL_COMMUNITY)
Admission: RE | Admit: 2022-12-05 | Discharge: 2022-12-05 | Disposition: A | Discharge status: Home / Self Care | Payer: PPO | Source: Ambulatory Visit | Attending: Student | Admitting: Student

## 2022-12-05 ENCOUNTER — Encounter (HOSPITAL_COMMUNITY): Payer: Self-pay | Admitting: Internal Medicine

## 2022-12-05 ENCOUNTER — Inpatient Hospital Stay (HOSPITAL_COMMUNITY)
Admission: EM | Admit: 2022-12-05 | Discharge: 2022-12-11 | DRG: 092 | Disposition: A | Discharge status: Home / Self Care | Payer: PPO | Attending: Internal Medicine | Admitting: Internal Medicine

## 2022-12-05 DIAGNOSIS — I110 Hypertensive heart disease with heart failure: Secondary | ICD-10-CM | POA: Diagnosis present

## 2022-12-05 DIAGNOSIS — D638 Anemia in other chronic diseases classified elsewhere: Secondary | ICD-10-CM | POA: Diagnosis not present

## 2022-12-05 DIAGNOSIS — M4640 Discitis, unspecified, site unspecified: Secondary | ICD-10-CM

## 2022-12-05 DIAGNOSIS — Z823 Family history of stroke: Secondary | ICD-10-CM

## 2022-12-05 DIAGNOSIS — Z6838 Body mass index (BMI) 38.0-38.9, adult: Secondary | ICD-10-CM

## 2022-12-05 DIAGNOSIS — G8929 Other chronic pain: Secondary | ICD-10-CM | POA: Diagnosis present

## 2022-12-05 DIAGNOSIS — G4733 Obstructive sleep apnea (adult) (pediatric): Secondary | ICD-10-CM | POA: Diagnosis present

## 2022-12-05 DIAGNOSIS — E1122 Type 2 diabetes mellitus with diabetic chronic kidney disease: Secondary | ICD-10-CM | POA: Diagnosis not present

## 2022-12-05 DIAGNOSIS — M4644 Discitis, unspecified, thoracic region: Secondary | ICD-10-CM

## 2022-12-05 DIAGNOSIS — I10 Essential (primary) hypertension: Secondary | ICD-10-CM | POA: Diagnosis not present

## 2022-12-05 DIAGNOSIS — F411 Generalized anxiety disorder: Secondary | ICD-10-CM | POA: Diagnosis not present

## 2022-12-05 DIAGNOSIS — T85733D Infection and inflammatory reaction due to implanted electronic neurostimulator of spinal cord, electrode (lead), subsequent encounter: Secondary | ICD-10-CM

## 2022-12-05 DIAGNOSIS — Y831 Surgical operation with implant of artificial internal device as the cause of abnormal reaction of the patient, or of later complication, without mention of misadventure at the time of the procedure: Secondary | ICD-10-CM | POA: Diagnosis present

## 2022-12-05 DIAGNOSIS — E1165 Type 2 diabetes mellitus with hyperglycemia: Secondary | ICD-10-CM | POA: Diagnosis present

## 2022-12-05 DIAGNOSIS — Z7984 Long term (current) use of oral hypoglycemic drugs: Secondary | ICD-10-CM

## 2022-12-05 DIAGNOSIS — Z888 Allergy status to other drugs, medicaments and biological substances status: Secondary | ICD-10-CM

## 2022-12-05 DIAGNOSIS — E1142 Type 2 diabetes mellitus with diabetic polyneuropathy: Secondary | ICD-10-CM | POA: Diagnosis present

## 2022-12-05 DIAGNOSIS — E872 Acidosis, unspecified: Secondary | ICD-10-CM | POA: Diagnosis not present

## 2022-12-05 DIAGNOSIS — D696 Thrombocytopenia, unspecified: Secondary | ICD-10-CM | POA: Diagnosis present

## 2022-12-05 DIAGNOSIS — T85732A Infection and inflammatory reaction due to implanted electronic neurostimulator of peripheral nerve, electrode (lead), initial encounter: Principal | ICD-10-CM | POA: Diagnosis present

## 2022-12-05 DIAGNOSIS — G2581 Restless legs syndrome: Secondary | ICD-10-CM | POA: Diagnosis present

## 2022-12-05 DIAGNOSIS — E785 Hyperlipidemia, unspecified: Secondary | ICD-10-CM | POA: Diagnosis present

## 2022-12-05 DIAGNOSIS — Z8249 Family history of ischemic heart disease and other diseases of the circulatory system: Secondary | ICD-10-CM

## 2022-12-05 DIAGNOSIS — E7800 Pure hypercholesterolemia, unspecified: Secondary | ICD-10-CM | POA: Diagnosis present

## 2022-12-05 DIAGNOSIS — M109 Gout, unspecified: Secondary | ICD-10-CM | POA: Diagnosis present

## 2022-12-05 DIAGNOSIS — I5032 Chronic diastolic (congestive) heart failure: Secondary | ICD-10-CM | POA: Diagnosis present

## 2022-12-05 DIAGNOSIS — Z90710 Acquired absence of both cervix and uterus: Secondary | ICD-10-CM

## 2022-12-05 DIAGNOSIS — Z7982 Long term (current) use of aspirin: Secondary | ICD-10-CM

## 2022-12-05 DIAGNOSIS — M8610 Other acute osteomyelitis, unspecified site: Secondary | ICD-10-CM

## 2022-12-05 DIAGNOSIS — M545 Low back pain, unspecified: Secondary | ICD-10-CM | POA: Diagnosis present

## 2022-12-05 DIAGNOSIS — M4804 Spinal stenosis, thoracic region: Secondary | ICD-10-CM | POA: Diagnosis present

## 2022-12-05 DIAGNOSIS — Z794 Long term (current) use of insulin: Secondary | ICD-10-CM

## 2022-12-05 DIAGNOSIS — F32A Depression, unspecified: Secondary | ICD-10-CM | POA: Diagnosis present

## 2022-12-05 DIAGNOSIS — Z87442 Personal history of urinary calculi: Secondary | ICD-10-CM

## 2022-12-05 DIAGNOSIS — N183 Chronic kidney disease, stage 3 unspecified: Secondary | ICD-10-CM | POA: Diagnosis not present

## 2022-12-05 DIAGNOSIS — E119 Type 2 diabetes mellitus without complications: Secondary | ICD-10-CM

## 2022-12-05 DIAGNOSIS — M8618 Other acute osteomyelitis, other site: Secondary | ICD-10-CM | POA: Diagnosis not present

## 2022-12-05 DIAGNOSIS — I2510 Atherosclerotic heart disease of native coronary artery without angina pectoris: Secondary | ICD-10-CM | POA: Diagnosis present

## 2022-12-05 DIAGNOSIS — K219 Gastro-esophageal reflux disease without esophagitis: Secondary | ICD-10-CM | POA: Diagnosis present

## 2022-12-05 DIAGNOSIS — M4624 Osteomyelitis of vertebra, thoracic region: Secondary | ICD-10-CM | POA: Diagnosis present

## 2022-12-05 DIAGNOSIS — T85733A Infection and inflammatory reaction due to implanted electronic neurostimulator of spinal cord, electrode (lead), initial encounter: Secondary | ICD-10-CM

## 2022-12-05 DIAGNOSIS — Z825 Family history of asthma and other chronic lower respiratory diseases: Secondary | ICD-10-CM

## 2022-12-05 DIAGNOSIS — Z981 Arthrodesis status: Secondary | ICD-10-CM

## 2022-12-05 DIAGNOSIS — M4620 Osteomyelitis of vertebra, site unspecified: Secondary | ICD-10-CM | POA: Diagnosis not present

## 2022-12-05 DIAGNOSIS — Z8719 Personal history of other diseases of the digestive system: Secondary | ICD-10-CM

## 2022-12-05 DIAGNOSIS — E1169 Type 2 diabetes mellitus with other specified complication: Secondary | ICD-10-CM | POA: Diagnosis present

## 2022-12-05 DIAGNOSIS — Z9049 Acquired absence of other specified parts of digestive tract: Secondary | ICD-10-CM

## 2022-12-05 DIAGNOSIS — Z79899 Other long term (current) drug therapy: Secondary | ICD-10-CM

## 2022-12-05 DIAGNOSIS — Z801 Family history of malignant neoplasm of trachea, bronchus and lung: Secondary | ICD-10-CM

## 2022-12-05 LAB — MAGNESIUM: Magnesium: 1.3 mg/dL — ABNORMAL LOW (ref 1.7–2.4)

## 2022-12-05 LAB — CBC WITH DIFFERENTIAL/PLATELET
Abs Immature Granulocytes: 0.03 10*3/uL (ref 0.00–0.07)
Basophils Absolute: 0 10*3/uL (ref 0.0–0.1)
Basophils Relative: 1 %
Eosinophils Absolute: 0.2 10*3/uL (ref 0.0–0.5)
Eosinophils Relative: 4 %
HCT: 32.5 % — ABNORMAL LOW (ref 36.0–46.0)
Hemoglobin: 10.4 g/dL — ABNORMAL LOW (ref 12.0–15.0)
Immature Granulocytes: 1 %
Lymphocytes Relative: 13 %
Lymphs Abs: 0.8 10*3/uL (ref 0.7–4.0)
MCH: 30.2 pg (ref 26.0–34.0)
MCHC: 32 g/dL (ref 30.0–36.0)
MCV: 94.5 fL (ref 80.0–100.0)
Monocytes Absolute: 0.4 10*3/uL (ref 0.1–1.0)
Monocytes Relative: 6 %
Neutro Abs: 4.8 10*3/uL (ref 1.7–7.7)
Neutrophils Relative %: 75 %
Platelets: 128 10*3/uL — ABNORMAL LOW (ref 150–400)
RBC: 3.44 MIL/uL — ABNORMAL LOW (ref 3.87–5.11)
RDW: 14.9 % (ref 11.5–15.5)
WBC: 6.3 10*3/uL (ref 4.0–10.5)
nRBC: 0 % (ref 0.0–0.2)

## 2022-12-05 LAB — SEDIMENTATION RATE: Sed Rate: 63 mm/hr — ABNORMAL HIGH (ref 0–22)

## 2022-12-05 LAB — COMPREHENSIVE METABOLIC PANEL
ALT: 32 U/L (ref 0–44)
AST: 46 U/L — ABNORMAL HIGH (ref 15–41)
Albumin: 3.1 g/dL — ABNORMAL LOW (ref 3.5–5.0)
Alkaline Phosphatase: 179 U/L — ABNORMAL HIGH (ref 38–126)
Anion gap: 11 (ref 5–15)
BUN: 13 mg/dL (ref 8–23)
CO2: 29 mmol/L (ref 22–32)
Calcium: 9.1 mg/dL (ref 8.9–10.3)
Chloride: 97 mmol/L — ABNORMAL LOW (ref 98–111)
Creatinine, Ser: 1.03 mg/dL — ABNORMAL HIGH (ref 0.44–1.00)
GFR, Estimated: >60 mL/min (ref >60)
Glucose, Bld: 261 mg/dL — ABNORMAL HIGH (ref 70–99)
Potassium: 3.8 mmol/L (ref 3.5–5.1)
Sodium: 137 mmol/L (ref 135–145)
Total Bilirubin: 0.6 mg/dL (ref 0.3–1.2)
Total Protein: 7.2 g/dL (ref 6.5–8.1)

## 2022-12-05 LAB — PROTIME-INR
INR: 1 (ref 0.8–1.2)
Prothrombin Time: 12.8 seconds (ref 11.4–15.2)

## 2022-12-05 LAB — CBG MONITORING, ED: Glucose-Capillary: 218 mg/dL — ABNORMAL HIGH (ref 70–99)

## 2022-12-05 LAB — LACTIC ACID, PLASMA
Lactic Acid, Venous: 2.7 mmol/L (ref 0.5–1.9)
Lactic Acid, Venous: 1.8 mmol/L (ref 0.5–1.9)

## 2022-12-05 MED ORDER — LACTATED RINGERS IV BOLUS
1000.0000 mL | Freq: Once | INTRAVENOUS | Status: AC
  Administered 2022-12-05: 1000 mL via INTRAVENOUS

## 2022-12-05 MED ORDER — GADOBUTROL 1 MMOL/ML IV SOLN
10.0000 mL | Freq: Once | INTRAVENOUS | Status: AC | PRN
  Administered 2022-12-05: 10 mL via INTRAVENOUS

## 2022-12-05 MED ORDER — PRAMIPEXOLE DIHYDROCHLORIDE 0.25 MG PO TABS
1.0000 mg | ORAL_TABLET | Freq: Every day | ORAL | Status: DC
  Administered 2022-12-05 – 2022-12-10 (×6): 1 mg via ORAL
  Filled 2022-12-05: qty 1
  Filled 2022-12-05 (×5): qty 4
  Filled 2022-12-05: qty 1

## 2022-12-05 MED ORDER — INSULIN GLARGINE-YFGN 100 UNIT/ML ~~LOC~~ SOLN
50.0000 [IU] | Freq: Every day | SUBCUTANEOUS | Status: DC
  Administered 2022-12-05 – 2022-12-10 (×6): 50 [IU] via SUBCUTANEOUS
  Filled 2022-12-05 (×7): qty 0.5

## 2022-12-05 MED ORDER — ONDANSETRON HCL 4 MG/2ML IJ SOLN: 4.0000 mg | Freq: Four times a day (QID) | INTRAMUSCULAR | Status: DC | PRN

## 2022-12-05 MED ORDER — DULOXETINE HCL 60 MG PO CPEP
60.0000 mg | ORAL_CAPSULE | Freq: Every day | ORAL | Status: DC
  Administered 2022-12-05 – 2022-12-10 (×6): 60 mg via ORAL
  Filled 2022-12-05 (×6): qty 1

## 2022-12-05 MED ORDER — MELATONIN 3 MG PO TABS
3.0000 mg | ORAL_TABLET | Freq: Every evening | ORAL | Status: DC | PRN
  Administered 2022-12-10: 3 mg via ORAL
  Filled 2022-12-05: qty 1

## 2022-12-05 MED ORDER — ACETAMINOPHEN 650 MG RE SUPP: 650.0000 mg | Freq: Four times a day (QID) | RECTAL | Status: DC | PRN

## 2022-12-05 MED ORDER — NALOXONE HCL 0.4 MG/ML IJ SOLN: 0.4000 mg | INTRAMUSCULAR | Status: DC | PRN

## 2022-12-05 MED ORDER — PANTOPRAZOLE SODIUM 40 MG PO TBEC
40.0000 mg | DELAYED_RELEASE_TABLET | Freq: Every day | ORAL | Status: DC
  Administered 2022-12-06 – 2022-12-11 (×6): 40 mg via ORAL
  Filled 2022-12-05 (×6): qty 1

## 2022-12-05 MED ORDER — LACTATED RINGERS IV SOLN: INTRAVENOUS | Status: AC

## 2022-12-05 MED ORDER — HYDROMORPHONE HCL 1 MG/ML IJ SOLN
0.5000 mg | INTRAMUSCULAR | Status: DC | PRN
  Administered 2022-12-05 – 2022-12-06 (×3): 0.5 mg via INTRAVENOUS
  Filled 2022-12-05 (×3): qty 1

## 2022-12-05 MED ORDER — ACETAMINOPHEN 325 MG PO TABS
650.0000 mg | ORAL_TABLET | Freq: Four times a day (QID) | ORAL | Status: DC | PRN
  Administered 2022-12-07 – 2022-12-10 (×2): 650 mg via ORAL
  Filled 2022-12-05 (×2): qty 2

## 2022-12-05 MED ORDER — PREGABALIN 75 MG PO CAPS
75.0000 mg | ORAL_CAPSULE | Freq: Every day | ORAL | Status: DC
  Administered 2022-12-05 – 2022-12-10 (×6): 75 mg via ORAL
  Filled 2022-12-05: qty 3
  Filled 2022-12-05 (×5): qty 1

## 2022-12-05 MED ORDER — ROSUVASTATIN CALCIUM 5 MG PO TABS
10.0000 mg | ORAL_TABLET | Freq: Every day | ORAL | Status: DC
  Administered 2022-12-05 – 2022-12-11 (×7): 10 mg via ORAL
  Filled 2022-12-05 (×7): qty 2

## 2022-12-05 MED ORDER — INSULIN ASPART 100 UNIT/ML IJ SOLN
0.0000 [IU] | Freq: Four times a day (QID) | INTRAMUSCULAR | Status: DC
  Administered 2022-12-05: 3 [IU] via SUBCUTANEOUS
  Administered 2022-12-06: 2 [IU] via SUBCUTANEOUS
  Administered 2022-12-06: 1 [IU] via SUBCUTANEOUS
  Administered 2022-12-07: 2 [IU] via SUBCUTANEOUS
  Administered 2022-12-07: 3 [IU] via SUBCUTANEOUS
  Administered 2022-12-07: 1 [IU] via SUBCUTANEOUS
  Administered 2022-12-08 (×2): 2 [IU] via SUBCUTANEOUS
  Administered 2022-12-08: 1 [IU] via SUBCUTANEOUS
  Administered 2022-12-09 – 2022-12-10 (×4): 2 [IU] via SUBCUTANEOUS
  Administered 2022-12-10 (×2): 3 [IU] via SUBCUTANEOUS
  Administered 2022-12-11 (×2): 1 [IU] via SUBCUTANEOUS
  Administered 2022-12-11: 2 [IU] via SUBCUTANEOUS

## 2022-12-05 NOTE — ED Triage Notes (Signed)
Pt advised to come to ED by Dr. Ronnald Ramp for admission d/t infection s/p having a spinal nerve stimulator implanted beginning of December.

## 2022-12-05 NOTE — ED Provider Triage Note (Addendum)
Emergency Medicine Provider Triage Evaluation Note  Katelyn [name] , a 67 y.o. female  was evaluated in triage.  Patient presenting with a concern of a spinal infection.  She has a spinal stimulator that was placed in December and was having pain so she had an MRI earlier today outpatient and they called her about an hour ago saying that she had a bad spine infection and needed to come to the emergency department.  Denies any fevers or chills.   IMPRESSION: 1. 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 Ventral epidural enhancement/phlegmon extending from T10-T11 through L1-L2. Dorsal epidural enhancement/phlegmon from T8-T9 through L1-L2. No discrete epidural or paravertebral abscess. 2. Dorsal epidural enhancement extends to and surrounds the spinal cord stimulator leads. There is also some superficial soft tissue swelling and enhancement surrounding the spinal cord stimulator leads in the posterior midline upper back. Some of this may be residual postsurgical changes given recent placement of the stimulator two and a half months ago, but infected spinal cord stimulator leads should be considered. 3. 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   These results will be called to the ordering clinician or representative by the Radiologist Assistant, and communication documented in the PACS or [employer].   Review of Systems  Positive:  Negative:   Physical Exam  BP (!) 165/77 (BP Location: Right Arm)   Pulse (!) 106   Temp 99.5 F (37.5 C) (Oral)   Resp 20   SpO2 96%  Gen:   Awake, no distress   Resp:  Normal effort  MSK:   Moves extremities without difficulty  Other:  No midline tenderness, full range of motion to the spine.  Medical Decision Making  Medically screening exam initiated at 6:14 PM.  Appropriate orders placed.  Perian Pohle Milke was informed that the remainder of the evaluation will be completed by another provider, this  initial triage assessment does not replace that evaluation, and the importance of remaining in the ED until their evaluation is complete.    Borderline febrile, mildly tachycardic with known spinal infection.  Sepsis initiated.    Darliss Ridgel 12/05/22 1818   Charge nurse made aware that the patient needs a room   Rhae Hammock, PA-C 12/05/22 1819

## 2022-12-05 NOTE — ED Provider Notes (Signed)
[facility] Provider Note   CSN: [id number] Arrival date & time: 12/05/22  1738     History  No chief complaint on file.   Katelyn [name] is a 67 y.o. female.  HPI   This is a 67 year old female with very complicated history of previous lumbar surgeries presenting to the emergency department due to discitis/osteomyelitis.  Patient had a spinal stimulator placed in December of last year, she has been having right-sided back pain which is been increasing over the past 2 months.  Worked up for a kidney stone outpatient, negative.  Her neurosurgeon ordered an MRI which is notable for acute discitis and osteomyelitis with concern for epidural enhancement/phlegmon.  Also concern for spinal cord stimulator.  They recommended coming to the ED for admission.  Patient denies any lateralized weakness numbness, loss of bladder or bowel function, she has been having fevers at home.  Home Medications Prior to Admission medications   Medication Sig Start Date End Date Taking? Authorizing Provider  acetaminophen (TYLENOL) 325 MG tablet Take 2 tablets (650 mg total) by mouth every 4 (four) hours as needed for headache or mild pain. 10/07/20   Isaiah Serge, NP  allopurinol (ZYLOPRIM) 100 MG tablet Take 100 mg by mouth at bedtime. 10/20/19   [provider]  aspirin EC 81 MG tablet Take 81 mg by mouth daily. **presently on hold for surgery**    [provider]  carvedilol (COREG) 6.25 MG tablet Take 1 tablet (6.25 mg total) by mouth 2 (two) times daily. Take 6.25 mg by mouth 2 (two) times daily. / Needs appointment for further refills 04/27/21   Richardo Priest, MD  cyclobenzaprine (FLEXERIL) 5 MG tablet Take 5 mg by mouth 3 (three) times daily as needed for muscle spasms. 06/01/22   [provider]  DULoxetine (CYMBALTA) 60 MG capsule Take 60 mg by mouth at bedtime.     [provider]  GLUCOSAMINE-CHONDROITIN PO Take 1 tablet by  mouth 2 (two) times daily.    [provider]  metFORMIN (GLUCOPHAGE-XR) 500 MG 24 hr tablet Take 500 mg by mouth 2 (two) times daily. 11/11/21   [provider]  mirtazapine (REMERON) 15 MG tablet Take 15 mg by mouth at bedtime.    [provider]  MOUNJARO 5 MG/0.5ML Pen Inject 5 mg into the skin once a week. 05/17/22   [provider]  Multiple Vitamin (MULTIVITAMIN WITH MINERALS) TABS tablet Take 1 tablet by mouth daily. Centrum Silver Women    [provider]  Multiple Vitamins-Minerals (HAIR SKIN AND NAILS FORMULA) TABS Take 1 tablet by mouth daily. 10/17/18   [provider]  nitroGLYCERIN (NITROSTAT) 0.4 MG SL tablet Place 0.4 mg under the tongue every 5 (five) minutes as needed for chest pain.     [provider]  NOVOLIN 70/30 (70-30) 100 UNIT/ML injection Inject 25-30 Units into the skin 3 (three) times daily. 03/14/22   [provider]  NOVOLOG FLEXPEN 100 UNIT/ML FlexPen Inject 55 Units into the skin 3 (three) times daily with meals as needed for high blood sugar. As needed for blood sugar over 150. Add 1 unit for every point over 150 11/15/21   [provider]  pantoprazole (PROTONIX) 40 MG tablet Take 1 tablet by mouth once daily 07/31/22   Richardo Priest, MD  pramipexole (MIRAPEX) 1 MG tablet Take 1 mg by mouth at bedtime.    [provider]  pregabalin (  LYRICA) 75 MG capsule Take 75 mg by mouth at bedtime. 09/24/21   [provider]  rosuvastatin (CRESTOR) 10 MG tablet Take 10 mg by mouth daily.    [provider]  torsemide (DEMADEX) 20 MG tablet Take one tablet by mouth once daily on Sunday, Tuesday, Thursday, and Saturday. Then take one tablet by mouth twice daily on Monday, Wednesday, and Friday. 10/31/22   Richardo Priest, MD  TRESIBA FLEXTOUCH 200 UNIT/ML FlexTouch Pen Inject 110 Units into the skin daily. 11/15/21   [provider]  valACYclovir (VALTREX) 1000 MG tablet Take  1,000 mg by mouth See admin instructions. Take one tablet (1000 mg) by mouth twice daily for 2 days, then take one tablet (1000 mg) daily for 2 days - as needed for fever blisters    [provider]      Allergies    Atorvastatin, Talwin [pentazocine], and Other    Review of Systems   Review of Systems  Physical Exam Updated Vital Signs BP (!) 155/64   Pulse 92   Temp 99.5 F (37.5 C) (Oral)   Resp 20   SpO2 97%  Physical Exam Vitals and nursing note reviewed. Exam conducted with a chaperone present.  Constitutional:      Appearance: Normal appearance.  HENT:     Head: Normocephalic and atraumatic.  Eyes:     General: No scleral icterus.       Right eye: No discharge.        Left eye: No discharge.     Extraocular Movements: Extraocular movements intact.     Pupils: Pupils are equal, round, and reactive to light.  Cardiovascular:     Rate and Rhythm: Normal rate and regular rhythm.     Pulses: Normal pulses.     Heart sounds: Normal heart sounds. No murmur heard.    No friction rub. No gallop.  Pulmonary:     Effort: Pulmonary effort is normal. No respiratory distress.     Breath sounds: Normal breath sounds.  Abdominal:     General: Abdomen is flat. Bowel sounds are normal. There is no distension.     Palpations: Abdomen is soft.     Tenderness: There is no abdominal tenderness.  Skin:    General: Skin is warm and dry.     Coloration: Skin is not jaundiced.  Neurological:     Mental Status: She is alert. Mental status is at baseline.     Coordination: Coordination normal.     ED Results / Procedures / Treatments   Labs (all labs ordered are listed, but only abnormal results are displayed) Labs Reviewed  COMPREHENSIVE METABOLIC PANEL - Abnormal; Notable for the following components:      Result Value   Chloride 97 (*)    Glucose, Bld 261 (*)    Creatinine, Ser 1.03 (*)    Albumin 3.1 (*)    AST 46 (*)    Alkaline Phosphatase 179 (*)    All other  components within normal limits  LACTIC ACID, PLASMA - Abnormal; Notable for the following components:   Lactic Acid, Venous 2.7 (*)    All other components within normal limits  CBC WITH DIFFERENTIAL/PLATELET - Abnormal; Notable for the following components:   RBC 3.44 (*)    Hemoglobin 10.4 (*)    HCT 32.5 (*)    Platelets 128 (*)    All other components within normal limits  CULTURE, BLOOD (ROUTINE X 2)  CULTURE, BLOOD (ROUTINE X  2)  PROTIME-INR  LACTIC ACID, PLASMA  URINALYSIS, ROUTINE W REFLEX MICROSCOPIC    EKG None  Radiology MR THORACIC SPINE W WO CONTRAST  Result Date: 12/05/2022 CLINICAL DATA:  Acute osteomyelitis. EXAM: MRI THORACIC WITHOUT AND WITH CONTRAST TECHNIQUE: Multiplanar and multiecho pulse sequences of the thoracic spine were obtained without and with intravenous contrast. CONTRAST:  49m GADAVIST GADOBUTROL 1 MMOL/ML IV SOLN COMPARISON:  CT abdomen pelvis dated November 30, 2022. MRI thoracic spine dated June 16, 2021. MRI lumbar spine dated November 09, 2020. FINDINGS: Alignment:  Trace retrolisthesis at T11-T12. Vertebrae: Prominent abnormal fluid and enhancement of the T11-T12 disc space with adjacent endplate erosion and diffuse marrow edema and enhancement involving the T11 and T12 vertebral bodies, consistent with osteomyelitis discitis. No acute fracture or suspicious bone lesion. Partially visualized lumbar fusion hardware. Cord: Normal signal and morphology. Dorsal epidural enhancement from T8-T9 through T12-L1. Ventral epidural enhancement from T10-T11 through L1-L2. No discrete epidural fluid collection. No abnormal intrathecal enhancement. Paraspinal and other soft tissues: Paravertebral inflammatory changes at T11-T12. No fluid collection. Spinal cord stimulator entering the dorsal epidural space at T8-T9 with the lead tips at T6. Epidural enhancement around the spinal cord stimulator lead tips. Soft tissue edema and enhancement surrounding the spinal  stimulator leads in the superficial soft tissues posterior to the epidural insertion site. No fluid collection. Low T1 and T2 signal artifact in the thoracic paraspinous muscles is unchanged compared to prior study. Disc levels: T1-T2: Negative disc. Mild bilateral facet arthropathy. No stenosis. T2-T3: Small broad-based posterior disc protrusion and mild bilateral facet arthropathy. No stenosis. T3-T4: Small right paracentral disc protrusion. Mild bilateral facet arthropathy. No stenosis. T4-T5: Mild disc bulging.  No stenosis. T5-T6: Small circumferential disc osteophyte complex.  No stenosis. T6-T7: Mild disc bulging.  No stenosis. T7-T8: Mild disc bulging.  No stenosis. T8-T9: Mild disc bulging.  No stenosis. T9-T10: Negative. T10-T11: Mild disc bulging.  No stenosis. T11-T12: Discitis osteomyelitis. Bilateral facet arthropathy. Moderate spinal canal stenosis. Moderate to severe left and severe right neuroforaminal stenosis. IMPRESSION: 1. 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 Ventral epidural enhancement/phlegmon extending from T10-T11 through L1-L2. Dorsal epidural enhancement/phlegmon from T8-T9 through L1-L2. No discrete epidural or paravertebral abscess. 2. Dorsal epidural enhancement extends to and surrounds the spinal cord stimulator leads. There is also some superficial soft tissue swelling and enhancement surrounding the spinal cord stimulator leads in the posterior midline upper back. Some of this may be residual postsurgical changes given recent placement of the stimulator two and a half months ago, but infected spinal cord stimulator leads should be considered. 3. 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 These results will be called to the ordering clinician or representative by the Radiologist Assistant, and communication documented in the PACS or [employer] Electronically Signed   By: WTitus DubinM.D.   On: 12/05/2022 14:23     Procedures Procedures    Medications Ordered in ED Medications  lactated ringers bolus 1,000 mL (has no administration in time range)    ED Course/ Medical Decision Making/ A&P Clinical Course as of 12/05/22 1953  Tue Dec 05, 2022  1938 I consulted with infectious disease and spoke with RThomasena Edis he recommends holding off on starting antibiotics in case patient needs biopsy with IR tomorrow. [HS]    Clinical Course User Index [HS] SSherrill Raring [place]  Medical Decision Making Risk Decision regarding hospitalization.   This is a 67 year old female presenting to the emergency department due to discitis.  Concern for possible sepsis as well, will check septic labs and consult with neurosurgery.  I reviewed external medical records including MRI which was obtained earlier today impression below:  IMPRESSION: 1. 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 Ventral epidural enhancement/phlegmon extending from T10-T11 through L1-L2. Dorsal epidural enhancement/phlegmon from T8-T9 through L1-L2. No discrete epidural or paravertebral abscess. 2. Dorsal epidural enhancement extends to and surrounds the spinal cord stimulator leads. There is also some superficial soft tissue swelling and enhancement surrounding the spinal cord stimulator leads in the posterior midline upper back. Some of this may be residual postsurgical changes given recent placement of the stimulator two and a half months ago, but infected spinal cord stimulator leads should be considered. 3. 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   I consulted with NP Megan with neurosurgery - per Dr. Ronnald Ramp not a surgical candidate, requesting medical admission and IV antibiotics.  Consult infectious disease document ED course, will hold antibiotics until IR can do biopsy.   Patient's lactic acid was elevated at 2.7, fluids ordered.  CBC without leukocytosis.   CMP with slight transaminitis, no gross electrolyte derangement.    Will consult hospitalist for admission.        Final Clinical Impression(s) / ED Diagnoses Final diagnoses:  Discitis, unspecified spinal region    Rx / DC Orders ED Discharge Orders     None         Sherrill Raring, Hershal Coria 12/05/22 2010    Drenda Freeze, MD 12/05/22 2053

## 2022-12-05 NOTE — ED Notes (Signed)
Pt turned off her spinal stimulator per MD order

## 2022-12-05 NOTE — ED Notes (Signed)
ED Provider at bedside. 

## 2022-12-05 NOTE — H&P (Signed)
History and Physical      Katelyn [name] [id number] DOB: January 01, 1956 DOA: 12/05/2022  PCP: Algis Greenhouse, MD *** Patient coming from: home ***  I have personally briefly reviewed patient's old medical records in [place]  Chief Complaint: ***  HPI: Katelyn [name] is a 66 y.o. female with medical history significant for *** who is admitted to [facility] on 12/05/2022 with *** after presenting from home*** to [facility] ED complaining of ***.    ***       ***   ED Course:  Vital signs in the ED were notable for the following: ***  Labs were notable for the following: ***  Per my interpretation, EKG in ED demonstrated the following:  ***  Imaging and additional notable ED work-up: ***  While in the ED, the following were administered: ***  Subsequently, the patient was admitted  ***  ***red    Review of Systems: As per HPI otherwise 10 point review of systems negative.   Past Medical History:  Diagnosis Date   Anxiety    Arthritis    Bursitis of right hip    CAD (coronary artery disease)    Cardiac catheterization June 2014 in [place] - 50% circumflex stenosis   Chest pain, neg MI, stable CAD non obstructive on cath 10/05/20 10/04/2020   Chronic diastolic heart failure ([place]) 08/20/2017   Chronic kidney disease, stage 3 ([place])    does not see nephrologist   Chronic low back pain without sciatica 03/14/2016   CKD (chronic kidney disease), stage III ([place]) [id number]   Complication of anesthesia    Cough 04/19/2017   Overview:  Last Assessment & Plan:  Formatting of this note may be different from the original. Cough - ? ACE related with AR triggers   Plan  Patient Instructions  Discuss with your primary doctor that lisinopril pain, need making your cough worse. May use Mucinex DM twice daily as needed for cough and congestion Zyrtec 10 mg at bedtime as needed for drainage Saline nasal spray as needed. Lab tests today Activity as tolerated. Follow with Dr.  [name] in 3-4 months and As needed   Please contact office for sooner follow up if symptoms do not improve or worsen or seek emergency care    Depression    Dyspnea    with exertion   " lazy lung" - per  [name] from back issues- 06/2016   Elevated liver enzymes 12/05/2016   Essential hypertension    GERD (gastroesophageal reflux disease)    Gout 03/14/2016   Greater trochanteric bursitis of right hip 02/02/2012   H/O hiatal hernia    Heart murmur    History of blood transfusion 2016   History of esophageal stricture 10/07/2020   History of kidney stones    Hypercholesterolemia    Hypertensive heart disease with heart failure ([place]) 01/01/2017   Hypoxia 10/07/2020   Iliotibial band syndrome of right side 02/02/2012   Iron deficiency anemia due to chronic blood loss 03/14/2016   LBBB (left bundle branch block) 01/01/2017   Left bundle branch block    Leg weakness 10/07/2020   Lumbar stenosis    Meralgia paraesthetica 12/05/2016   Mild CAD 11/24/2015   Morbid obesity ([place]) 10/07/2020   Neuropathy    OSA (obstructive sleep apnea) 05/24/2016   Overview:  Managed PULM   PONV (postoperative nausea and vomiting)    "no N/V with patch"   Restless leg syndrome  S/P lumbar laminectomy 11/26/2015   S/P lumbar spinal fusion 08/29/2016   Tinnitus 12/05/2016   Type 2 diabetes mellitus ([place])    UTI (urinary tract infection) 10/07/2020    Past Surgical History:  Procedure Laterality Date   ABDOMINAL HYSTERECTOMY  1983   APPENDECTOMY  Age 79   BACK SURGERY     FIRST LUMBAR FUSION/ SURGERY APRIL 2012 AND FUSION WITH INSTRUMENTATION SEPT 2012   CARDIAC CATHETERIZATION     x 2   CARPAL TUNNEL RELEASE     bil   CHOLECYSTECTOMY  1990's   COLONOSCOPY  12/12/2017   Colonic polyp status post polypectomy. Mild sigmoid diverticulosis. Otherwise normal colonoscopy to terminal ileum.   CYSTO EXTRACTION KIDNEY STONES     ESOPHAGOGASTRODUODENOSCOPY  12/12/2017   Small hiatal hernia. Mild gastritis. Status  post esophageal dilatation.   EXCISION/RELEASE BURSA HIP  02/02/2012   Procedure: EXCISION/RELEASE BURSA HIP;  Surgeon: Tobi Bastos, MD;  Location: [facility];  Service: Orthopedics;  Laterality: Right;  Right Hip Bursectomy   EYE SURGERY Bilateral    cataracts   KIDNEY STONE SURGERY  2008   Left knee surgery x 2  1996   reconstruction   LUMBAR DISC SURGERY  08/2016   LUMBAR LAMINECTOMY/DECOMPRESSION MICRODISCECTOMY Right 11/26/2015   Procedure: Extraforaminal Microdiscectomy  - Lumbar two-three- right;  Surgeon: Eustace Moore, MD;  Location: [place];  Service: Neurosurgery;  Laterality: Right;  right    LUMBAR WOUND DEBRIDEMENT N/A 10/25/2016   Procedure: Lumbar wound revision;  Surgeon: Eustace Moore, MD;  Location: [facility];  Service: Neurosurgery;  Laterality: N/A;  Lumbar wound revision   Right shoulder surgery  2010   spur   RIGHT/LEFT HEART CATH AND CORONARY ANGIOGRAPHY N/A 10/05/2020   Procedure: RIGHT/LEFT HEART CATH AND CORONARY ANGIOGRAPHY;  Surgeon: Martinique, Peter M, MD;  Location: [facility];  Service: Cardiovascular;  Laterality: N/A;    Social History:  reports that she has never smoked. She has never used smokeless tobacco. She reports current alcohol use. She reports that she does not use drugs.   Allergies  Allergen Reactions   Atorvastatin Nausea And Vomiting and Other (See Comments)    MYALGIAS   [place] [Pentazocine] Other (See Comments)    headache   Other Nausea Only    UNSPECIFIED Anesthesia    Family History  Problem Relation Age of Onset   Lung cancer Father        smoked   Hypertension Father    Stroke Father    Heart failure Mother    Bone cancer Sister    Asthma Sister     Family history reviewed and not pertinent ***   Prior to Admission medications   Medication Sig Start Date End Date Taking? Authorizing Provider  acetaminophen (TYLENOL) 325 MG tablet Take 2 tablets (650 mg total) by mouth every 4 (four) hours as needed for  headache or mild pain. 10/07/20   Isaiah Serge, NP  allopurinol (ZYLOPRIM) 100 MG tablet Take 100 mg by mouth at bedtime. 10/20/19   [provider]  aspirin EC 81 MG tablet Take 81 mg by mouth daily. **presently on hold for surgery**    [provider]  carvedilol (COREG) 6.25 MG tablet Take 1 tablet (6.25 mg total) by mouth 2 (two) times daily. Take 6.25 mg by mouth 2 (two) times daily. / Needs appointment for further refills 04/27/21   Richardo Priest, MD  cyclobenzaprine (FLEXERIL) 5 MG tablet Take  5 mg by mouth 3 (three) times daily as needed for muscle spasms. 06/01/22   [provider]  DULoxetine (CYMBALTA) 60 MG capsule Take 60 mg by mouth at bedtime.     [provider]  GLUCOSAMINE-CHONDROITIN PO Take 1 tablet by mouth 2 (two) times daily.    [provider]  metFORMIN (GLUCOPHAGE-XR) 500 MG 24 hr tablet Take 500 mg by mouth 2 (two) times daily. 11/11/21   [provider]  mirtazapine (REMERON) 15 MG tablet Take 15 mg by mouth at bedtime.    [provider]  MOUNJARO 5 MG/0.5ML Pen Inject 5 mg into the skin once a week. 05/17/22   [provider]  Multiple Vitamin (MULTIVITAMIN WITH MINERALS) TABS tablet Take 1 tablet by mouth daily. Centrum Silver Women    [provider]  Multiple Vitamins-Minerals (HAIR SKIN AND NAILS FORMULA) TABS Take 1 tablet by mouth daily. 10/17/18   [provider]  nitroGLYCERIN (NITROSTAT) 0.4 MG SL tablet Place 0.4 mg under the tongue every 5 (five) minutes as needed for chest pain.     [provider]  NOVOLIN 70/30 (70-30) 100 UNIT/ML injection Inject 25-30 Units into the skin 3 (three) times daily. 03/14/22   [provider]  NOVOLOG FLEXPEN 100 UNIT/ML FlexPen Inject 55 Units into the skin 3 (three) times daily with meals as needed for high blood sugar. As needed for blood sugar over 150. Add 1 unit for every point over 150 11/15/21   [provider]   pantoprazole (PROTONIX) 40 MG tablet Take 1 tablet by mouth once daily 07/31/22   Richardo Priest, MD  pramipexole (MIRAPEX) 1 MG tablet Take 1 mg by mouth at bedtime.    [provider]  pregabalin (LYRICA) 75 MG capsule Take 75 mg by mouth at bedtime. 09/24/21   [provider]  rosuvastatin (CRESTOR) 10 MG tablet Take 10 mg by mouth daily.    [provider]  torsemide (DEMADEX) 20 MG tablet Take one tablet by mouth once daily on Sunday, Tuesday, Thursday, and Saturday. Then take one tablet by mouth twice daily on Monday, Wednesday, and Friday. 10/31/22   Richardo Priest, MD  TRESIBA FLEXTOUCH 200 UNIT/ML FlexTouch Pen Inject 110 Units into the skin daily. 11/15/21   [provider]  valACYclovir (VALTREX) 1000 MG tablet Take 1,000 mg by mouth See admin instructions. Take one tablet (1000 mg) by mouth twice daily for 2 days, then take one tablet (1000 mg) daily for 2 days - as needed for fever blisters    [provider]     Objective    Physical Exam: Vitals:   12/05/22 1835 12/05/22 1845 12/05/22 1900 12/05/22 2042  BP: (!) 152/60 (!) 155/64    Pulse: 99 93 92   Resp: 20 20    Temp:      TempSrc:      SpO2: 98% 97% 97%   Weight:    94.3 kg  Height:    '5\' 2"'$  (1.575 m)    General: appears to be stated age; alert, oriented Skin: warm, dry, no rash Head:  AT/[place] Mouth:  Oral mucosa membranes appear moist, normal dentition Neck: supple; trachea midline Heart:  RRR; did not appreciate any M/R/G Lungs: CTAB, did not appreciate any wheezes, rales, or rhonchi Abdomen: + BS; soft, ND, NT Vascular: 2+ pedal pulses b/l; 2+ radial pulses b/l Extremities: no peripheral edema, no muscle wasting Neuro: strength and sensation intact in upper and lower  extremities b/l ***   *** Neuro: 5/5 strength of the proximal and distal flexors and extensors of the upper and lower extremities bilaterally; sensation intact in upper and lower extremities b/l;  cranial nerves II through XII grossly intact; no pronator drift; no evidence suggestive of slurred speech, dysarthria, or facial droop; Normal muscle tone. No tremors.  *** Neuro: In the setting of the patient's current mental status and associated inability to follow instructions, unable to perform full neurologic exam at this time.  As such, assessment of strength, sensation, and cranial nerves is limited at this time. Patient noted to spontaneously move all 4 extremities. No tremors.  ***    Labs on Admission: I have personally reviewed following labs and imaging studies  CBC: Recent Labs  Lab 12/05/22 1807  WBC 6.3  NEUTROABS 4.8  HGB 10.4*  HCT 32.5*  MCV 94.5  PLT 0000000*   Basic Metabolic Panel: Recent Labs  Lab 12/05/22 1807  NA 137  K 3.8  CL 97*  CO2 29  GLUCOSE 261*  BUN 13  CREATININE 1.03*  CALCIUM 9.1   GFR: Estimated Creatinine Clearance: 58.3 mL/min (A) (by C-G formula based on SCr of 1.03 mg/dL (H)). Liver Function Tests: Recent Labs  Lab 12/05/22 1807  AST 46*  ALT 32  ALKPHOS 179*  BILITOT 0.6  PROT 7.2  ALBUMIN 3.1*   No results for input(s): "LIPASE", "AMYLASE" in the last 168 hours. No results for input(s): "AMMONIA" in the last 168 hours. Coagulation Profile: Recent Labs  Lab 12/05/22 1807  INR 1.0   Cardiac Enzymes: No results for input(s): "CKTOTAL", "CKMB", "CKMBINDEX", "TROPONINI" in the last 168 hours. BNP (last 3 results) No results for input(s): "PROBNP" in the last 8760 hours. HbA1C: No results for input(s): "HGBA1C" in the last 72 hours. CBG: No results for input(s): "GLUCAP" in the last 168 hours. Lipid Profile: No results for input(s): "CHOL", "HDL", "LDLCALC", "TRIG", "CHOLHDL", "LDLDIRECT" in the last 72 hours. Thyroid Function Tests: No results for input(s): "TSH", "T4TOTAL", "FREET4", "T3FREE", "THYROIDAB" in the last 72 hours. Anemia Panel: No results for input(s): "VITAMINB12", "FOLATE", "FERRITIN", "TIBC",  "IRON", "RETICCTPCT" in the last 72 hours. Urine analysis:    Component Value Date/Time   COLORURINE YELLOW 10/07/2020 0902   APPEARANCEUR HAZY (A) 10/07/2020 0902   LABSPEC 1.018 10/07/2020 0902   PHURINE 5.0 10/07/2020 0902   GLUCOSEU >=500 (A) 10/07/2020 0902   HGBUR NEGATIVE 10/07/2020 0902   BILIRUBINUR NEGATIVE 10/07/2020 0902   KETONESUR NEGATIVE 10/07/2020 0902   PROTEINUR NEGATIVE 10/07/2020 0902   UROBILINOGEN 0.2 01/31/2012 1504   NITRITE POSITIVE (A) 10/07/2020 0902   LEUKOCYTESUR MODERATE (A) 10/07/2020 0902    Radiological Exams on Admission: MR THORACIC SPINE W WO CONTRAST  Result Date: 12/05/2022 CLINICAL DATA:  Acute osteomyelitis. EXAM: MRI THORACIC WITHOUT AND WITH CONTRAST TECHNIQUE: Multiplanar and multiecho pulse sequences of the thoracic spine were obtained without and with intravenous contrast. CONTRAST:  52m GADAVIST GADOBUTROL 1 MMOL/ML IV SOLN COMPARISON:  CT abdomen pelvis dated November 30, 2022. MRI thoracic spine dated June 16, 2021. MRI lumbar spine dated November 09, 2020. FINDINGS: Alignment:  Trace retrolisthesis at T11-T12. Vertebrae: Prominent abnormal fluid and enhancement of the T11-T12 disc space with adjacent endplate erosion and diffuse marrow edema and enhancement involving the T11 and T12 vertebral bodies, consistent with osteomyelitis discitis. No acute fracture or suspicious bone lesion. Partially visualized lumbar fusion hardware. Cord: Normal signal and morphology. Dorsal epidural enhancement from T8-T9 through T12-L1. Ventral epidural  enhancement from T10-T11 through L1-L2. No discrete epidural fluid collection. No abnormal intrathecal enhancement. Paraspinal and other soft tissues: Paravertebral inflammatory changes at T11-T12. No fluid collection. Spinal cord stimulator entering the dorsal epidural space at T8-T9 with the lead tips at T6. Epidural enhancement around the spinal cord stimulator lead tips. Soft tissue edema and enhancement  surrounding the spinal stimulator leads in the superficial soft tissues posterior to the epidural insertion site. No fluid collection. Low T1 and T2 signal artifact in the thoracic paraspinous muscles is unchanged compared to prior study. Disc levels: T1-T2: Negative disc. Mild bilateral facet arthropathy. No stenosis. T2-T3: Small broad-based posterior disc protrusion and mild bilateral facet arthropathy. No stenosis. T3-T4: Small right paracentral disc protrusion. Mild bilateral facet arthropathy. No stenosis. T4-T5: Mild disc bulging.  No stenosis. T5-T6: Small circumferential disc osteophyte complex.  No stenosis. T6-T7: Mild disc bulging.  No stenosis. T7-T8: Mild disc bulging.  No stenosis. T8-T9: Mild disc bulging.  No stenosis. T9-T10: Negative. T10-T11: Mild disc bulging.  No stenosis. T11-T12: Discitis osteomyelitis. Bilateral facet arthropathy. Moderate spinal canal stenosis. Moderate to severe left and severe right neuroforaminal stenosis. IMPRESSION: 1. 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 Ventral epidural enhancement/phlegmon extending from T10-T11 through L1-L2. Dorsal epidural enhancement/phlegmon from T8-T9 through L1-L2. No discrete epidural or paravertebral abscess. 2. Dorsal epidural enhancement extends to and surrounds the spinal cord stimulator leads. There is also some superficial soft tissue swelling and enhancement surrounding the spinal cord stimulator leads in the posterior midline upper back. Some of this may be residual postsurgical changes given recent placement of the stimulator two and a half months ago, but infected spinal cord stimulator leads should be considered. 3. 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 These results will be called to the ordering clinician or representative by the Radiologist Assistant, and communication documented in the PACS or [employer]. Electronically Signed   By: Titus Dubin M.D.   On:  12/05/2022 14:23      Assessment/Plan   Principal Problem:   Diskitis   ***       ***            ***             ***            ***            ***            ***             ***           ***           ***           ***           ***          ***          ***  DVT prophylaxis: SCD's ***  Code Status: Full code*** Family Communication: none*** Disposition Plan: Per Rounding Team Consults called: none***;  Admission status: ***     I SPENT GREATER THAN 75 *** MINUTES IN CLINICAL CARE TIME/MEDICAL DECISION-MAKING IN COMPLETING THIS ADMISSION.      [place] DO [facility]  From [place]   12/05/2022, 9:08 PM   ***

## 2022-12-06 ENCOUNTER — Inpatient Hospital Stay (HOSPITAL_COMMUNITY): Payer: PPO

## 2022-12-06 DIAGNOSIS — M4620 Osteomyelitis of vertebra, site unspecified: Secondary | ICD-10-CM

## 2022-12-06 DIAGNOSIS — E872 Acidosis, unspecified: Secondary | ICD-10-CM | POA: Diagnosis present

## 2022-12-06 DIAGNOSIS — M4644 Discitis, unspecified, thoracic region: Secondary | ICD-10-CM

## 2022-12-06 DIAGNOSIS — D638 Anemia in other chronic diseases classified elsewhere: Secondary | ICD-10-CM | POA: Diagnosis present

## 2022-12-06 LAB — BRAIN NATRIURETIC PEPTIDE: B Natriuretic Peptide: 53 pg/mL (ref 0.0–100.0)

## 2022-12-06 LAB — CBC WITH DIFFERENTIAL/PLATELET
Abs Immature Granulocytes: 0.01 10*3/uL (ref 0.00–0.07)
Basophils Absolute: 0 10*3/uL (ref 0.0–0.1)
Basophils Relative: 0 %
Eosinophils Absolute: 0.3 10*3/uL (ref 0.0–0.5)
Eosinophils Relative: 4 %
HCT: 29.5 % — ABNORMAL LOW (ref 36.0–46.0)
Hemoglobin: 9.5 g/dL — ABNORMAL LOW (ref 12.0–15.0)
Immature Granulocytes: 0 %
Lymphocytes Relative: 19 %
Lymphs Abs: 1.4 10*3/uL (ref 0.7–4.0)
MCH: 30.7 pg (ref 26.0–34.0)
MCHC: 32.2 g/dL (ref 30.0–36.0)
MCV: 95.5 fL (ref 80.0–100.0)
Monocytes Absolute: 0.8 10*3/uL (ref 0.1–1.0)
Monocytes Relative: 11 %
Neutro Abs: 4.5 10*3/uL (ref 1.7–7.7)
Neutrophils Relative %: 66 %
Platelets: 142 10*3/uL — ABNORMAL LOW (ref 150–400)
RBC: 3.09 MIL/uL — ABNORMAL LOW (ref 3.87–5.11)
RDW: 15.9 % — ABNORMAL HIGH (ref 11.5–15.5)
WBC: 7 10*3/uL (ref 4.0–10.5)
nRBC: 0 % (ref 0.0–0.2)

## 2022-12-06 LAB — COMPREHENSIVE METABOLIC PANEL
ALT: 25 U/L (ref 0–44)
AST: 34 U/L (ref 15–41)
Albumin: 2.8 g/dL — ABNORMAL LOW (ref 3.5–5.0)
Alkaline Phosphatase: 153 U/L — ABNORMAL HIGH (ref 38–126)
Anion gap: 8 (ref 5–15)
BUN: 13 mg/dL (ref 8–23)
CO2: 29 mmol/L (ref 22–32)
Calcium: 8.7 mg/dL — ABNORMAL LOW (ref 8.9–10.3)
Chloride: 98 mmol/L (ref 98–111)
Creatinine, Ser: 0.89 mg/dL (ref 0.44–1.00)
GFR, Estimated: >60 mL/min (ref >60)
Glucose, Bld: 155 mg/dL — ABNORMAL HIGH (ref 70–99)
Potassium: 3.7 mmol/L (ref 3.5–5.1)
Sodium: 135 mmol/L (ref 135–145)
Total Bilirubin: 0.6 mg/dL (ref 0.3–1.2)
Total Protein: 6.6 g/dL (ref 6.5–8.1)

## 2022-12-06 LAB — GLUCOSE, CAPILLARY
Glucose-Capillary: 150 mg/dL — ABNORMAL HIGH (ref 70–99)
Glucose-Capillary: 158 mg/dL — ABNORMAL HIGH (ref 70–99)
Glucose-Capillary: 119 mg/dL — ABNORMAL HIGH (ref 70–99)
Glucose-Capillary: 126 mg/dL — ABNORMAL HIGH (ref 70–99)

## 2022-12-06 LAB — TYPE AND SCREEN
ABO/RH(D): A POS
Antibody Screen: NEGATIVE

## 2022-12-06 LAB — MAGNESIUM: Magnesium: 1.6 mg/dL — ABNORMAL LOW (ref 1.7–2.4)

## 2022-12-06 LAB — C-REACTIVE PROTEIN: CRP: 1.9 mg/dL — ABNORMAL HIGH (ref <1.0)

## 2022-12-06 MED ORDER — FENTANYL CITRATE (PF) 100 MCG/2ML IJ SOLN
INTRAMUSCULAR | Status: AC | PRN
  Administered 2022-12-06: 25 ug via INTRAVENOUS

## 2022-12-06 MED ORDER — MIDAZOLAM HCL 2 MG/2ML IJ SOLN
INTRAMUSCULAR | Status: AC
  Filled 2022-12-06: qty 2

## 2022-12-06 MED ORDER — FENTANYL CITRATE (PF) 100 MCG/2ML IJ SOLN
INTRAMUSCULAR | Status: AC
  Filled 2022-12-06: qty 2

## 2022-12-06 MED ORDER — SODIUM CHLORIDE 0.9 % IV SOLN
2.0000 g | Freq: Three times a day (TID) | INTRAVENOUS | Status: DC
  Administered 2022-12-06 – 2022-12-11 (×14): 2 g via INTRAVENOUS
  Filled 2022-12-06 (×14): qty 12.5

## 2022-12-06 MED ORDER — TORSEMIDE 20 MG PO TABS
20.0000 mg | ORAL_TABLET | Freq: Every day | ORAL | Status: DC
  Administered 2022-12-06 – 2022-12-11 (×6): 20 mg via ORAL
  Filled 2022-12-06 (×6): qty 1

## 2022-12-06 MED ORDER — VANCOMYCIN HCL IN DEXTROSE 1-5 GM/200ML-% IV SOLN
1000.0000 mg | INTRAVENOUS | Status: DC
  Administered 2022-12-07 – 2022-12-11 (×5): 1000 mg via INTRAVENOUS
  Filled 2022-12-06 (×5): qty 200

## 2022-12-06 MED ORDER — MAGNESIUM SULFATE 2 GM/50ML IV SOLN
2.0000 g | Freq: Once | INTRAVENOUS | Status: AC
  Administered 2022-12-06: 2 g via INTRAVENOUS
  Filled 2022-12-06: qty 50

## 2022-12-06 MED ORDER — CARVEDILOL 6.25 MG PO TABS
6.2500 mg | ORAL_TABLET | Freq: Two times a day (BID) | ORAL | Status: DC
  Administered 2022-12-06 – 2022-12-11 (×11): 6.25 mg via ORAL
  Filled 2022-12-06 (×11): qty 1

## 2022-12-06 MED ORDER — VANCOMYCIN HCL 2000 MG/400ML IV SOLN
2000.0000 mg | Freq: Once | INTRAVENOUS | Status: AC
  Administered 2022-12-06: 2000 mg via INTRAVENOUS
  Filled 2022-12-06: qty 400

## 2022-12-06 MED ORDER — OXYCODONE-ACETAMINOPHEN 5-325 MG PO TABS
1.0000 | ORAL_TABLET | ORAL | Status: DC | PRN
  Administered 2022-12-06 – 2022-12-11 (×12): 1 via ORAL
  Filled 2022-12-06 (×12): qty 1

## 2022-12-06 MED ORDER — BUPIVACAINE HCL (PF) 0.5 % IJ SOLN
INTRAMUSCULAR | Status: AC
  Administered 2022-12-06: 15 mL
  Filled 2022-12-06: qty 30

## 2022-12-06 MED ORDER — SODIUM CHLORIDE 0.9 % IV SOLN: INTRAVENOUS | Status: AC

## 2022-12-06 MED ORDER — MIDAZOLAM HCL 2 MG/2ML IJ SOLN
INTRAMUSCULAR | Status: AC | PRN
  Administered 2022-12-06: 1 mg via INTRAVENOUS

## 2022-12-06 NOTE — Consult Note (Signed)
Chief Complaint: Patient was seen in consultation today for T11-T12 disc aspiration  Referring Physician(s):Sage, Hildred Alamin, [place]  Supervising Physician: Luanne Bras  Patient Status: [facility] - In-pt  History of Present Illness: Katelyn [name] is a 67 y.o. female with a past medical history significant for anxiety, depression, OSA, GERD, gout, anemia, CAD, CHF, HTN, HLD, LBBB, DM, CKD, multiple lumbar spine surgeries including L2-L3 laminectomy/decompression 2017 and spinal stimulator placement 09/2022 by neurosurgery (Dr. Sherley Bounds) who presented to the ED with concerns for discitis/osteomyelitis. Ms. Stache reported 2 months of back pain which had continued to worsen, she was worked up for kidney stones as an outpatient which was negative. She was seen by her neurosurgeon for the same and underwent outpatient MRI of the thoracic spine 12/05/22 which showed:  1. 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 Ventral epidural enhancement/phlegmon extending from T10-T11 through L1-L2. Dorsal epidural enhancement/phlegmon from T8-T9 through L1-L2. No discrete epidural or paravertebral abscess. 2. Dorsal epidural enhancement extends to and surrounds the spinal cord stimulator leads. There is also some superficial soft tissue swelling and enhancement surrounding the spinal cord stimulator leads in the posterior midline upper back. Some of this may be residual postsurgical changes given recent placement of the stimulator two and a half months ago, but infected spinal cord stimulator leads should be considered. 3. 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  She was instructed to present to the ED for further evaluation and management. IR has been consulted for disc aspiration to further direct antibiotic therapy.   Patient seen in IR holding bay, she reports back pain just above her buttcrack which is mild and new as of today. Otherwise, she is feeling  well and is just nervous about everything going on. She is wondering if her spinal stimulator will need to come out. She reports a previous infection in her lumbar spine as well as rejection of cadaver tissue several years ago. She understands the indication of disc aspiration and is agreeable to proceed.  Past Medical History:  Diagnosis Date   Anxiety    Arthritis    Bursitis of right hip    CAD (coronary artery disease)    Cardiac catheterization June 2014 in [place] - 50% circumflex stenosis   Chest pain, neg MI, stable CAD non obstructive on cath 10/05/20 10/04/2020   Chronic diastolic heart failure ([place]) 08/20/2017   Chronic kidney disease, stage 3 ([place])    does not see nephrologist   Chronic low back pain without sciatica 03/14/2016   CKD (chronic kidney disease), stage III ([place]) [id number]   Complication of anesthesia    Cough 04/19/2017   Overview:  Last Assessment & Plan:  Formatting of this note may be different from the original. Cough - ? ACE related with AR triggers   Plan  Patient Instructions  Discuss with your primary doctor that lisinopril pain, need making your cough worse. May use Mucinex DM twice daily as needed for cough and congestion Zyrtec 10 mg at bedtime as needed for drainage Saline nasal spray as needed. Lab tests today Activity as tolerated. Follow with Dr. [name] in 3-4 months and As needed   Please contact office for sooner follow up if symptoms do not improve or worsen or seek emergency care    Depression    Dyspnea    with exertion   " lazy lung" - per  [name] from back issues- 06/2016   Elevated liver enzymes 12/05/2016   Essential hypertension  GERD (gastroesophageal reflux disease)    Gout 03/14/2016   Greater trochanteric bursitis of right hip 02/02/2012   H/O hiatal hernia    Heart murmur    History of blood transfusion 2016   History of esophageal stricture 10/07/2020   History of kidney stones    Hypercholesterolemia    Hypertensive heart disease  with heart failure ([place]) 01/01/2017   Hypoxia 10/07/2020   Iliotibial band syndrome of right side 02/02/2012   Iron deficiency anemia due to chronic blood loss 03/14/2016   LBBB (left bundle branch block) 01/01/2017   Left bundle branch block    Leg weakness 10/07/2020   Lumbar stenosis    Meralgia paraesthetica 12/05/2016   Mild CAD 11/24/2015   Morbid obesity ([place]) 10/07/2020   Neuropathy    OSA (obstructive sleep apnea) 05/24/2016   Overview:  Managed PULM   PONV (postoperative nausea and vomiting)    "no N/V with patch"   Restless leg syndrome    S/P lumbar laminectomy 11/26/2015   S/P lumbar spinal fusion 08/29/2016   Tinnitus 12/05/2016   Type 2 diabetes mellitus ([place])    UTI (urinary tract infection) 10/07/2020    Past Surgical History:  Procedure Laterality Date   ABDOMINAL HYSTERECTOMY  1983   APPENDECTOMY  Age 61   BACK SURGERY     FIRST LUMBAR FUSION/ SURGERY APRIL 2012 AND FUSION WITH INSTRUMENTATION SEPT 2012   CARDIAC CATHETERIZATION     x 2   CARPAL TUNNEL RELEASE     bil   CHOLECYSTECTOMY  1990's   COLONOSCOPY  12/12/2017   Colonic polyp status post polypectomy. Mild sigmoid diverticulosis. Otherwise normal colonoscopy to terminal ileum.   CYSTO EXTRACTION KIDNEY STONES     ESOPHAGOGASTRODUODENOSCOPY  12/12/2017   Small hiatal hernia. Mild gastritis. Status post esophageal dilatation.   EXCISION/RELEASE BURSA HIP  02/02/2012   Procedure: EXCISION/RELEASE BURSA HIP;  Surgeon: Tobi Bastos, MD;  Location: [facility];  Service: Orthopedics;  Laterality: Right;  Right Hip Bursectomy   EYE SURGERY Bilateral    cataracts   KIDNEY STONE SURGERY  2008   Left knee surgery x 2  1996   reconstruction   LUMBAR DISC SURGERY  08/2016   LUMBAR LAMINECTOMY/DECOMPRESSION MICRODISCECTOMY Right 11/26/2015   Procedure: Extraforaminal Microdiscectomy  - Lumbar two-three- right;  Surgeon: Eustace Moore, MD;  Location: [facility];  Service: Neurosurgery;  Laterality: Right;  right     LUMBAR WOUND DEBRIDEMENT N/A 10/25/2016   Procedure: Lumbar wound revision;  Surgeon: Eustace Moore, MD;  Location: [facility];  Service: Neurosurgery;  Laterality: N/A;  Lumbar wound revision   Right shoulder surgery  2010   spur   RIGHT/LEFT HEART CATH AND CORONARY ANGIOGRAPHY N/A 10/05/2020   Procedure: RIGHT/LEFT HEART CATH AND CORONARY ANGIOGRAPHY;  Surgeon: Martinique, Peter M, MD;  Location: [facility];  Service: Cardiovascular;  Laterality: N/A;    Allergies: Atorvastatin, Talwin [pentazocine], and Other  Medications: Prior to Admission medications   Medication Sig Start Date End Date Taking? Authorizing Provider  acetaminophen (TYLENOL) 325 MG tablet Take 2 tablets (650 mg total) by mouth every 4 (four) hours as needed for headache or mild pain. 10/07/20   Isaiah Serge, NP  allopurinol (ZYLOPRIM) 100 MG tablet Take 100 mg by mouth at bedtime. 10/20/19   [provider]  aspirin EC 81 MG tablet Take 81 mg by mouth daily. **presently on hold for surgery**    [provider]  carvedilol (COREG)  6.25 MG tablet Take 1 tablet (6.25 mg total) by mouth 2 (two) times daily. Take 6.25 mg by mouth 2 (two) times daily. / Needs appointment for further refills 04/27/21   Richardo Priest, MD  cyclobenzaprine (FLEXERIL) 5 MG tablet Take 5 mg by mouth 3 (three) times daily as needed for muscle spasms. 06/01/22   [provider]  DULoxetine (CYMBALTA) 60 MG capsule Take 60 mg by mouth at bedtime.     [provider]  GLUCOSAMINE-CHONDROITIN PO Take 1 tablet by mouth 2 (two) times daily.    [provider]  metFORMIN (GLUCOPHAGE-XR) 500 MG 24 hr tablet Take 500 mg by mouth 2 (two) times daily. 11/11/21   [provider]  mirtazapine (REMERON) 15 MG tablet Take 15 mg by mouth at bedtime.    [provider]  MOUNJARO 5 MG/0.5ML Pen Inject 5 mg into the skin once a week. 05/17/22   [provider]  Multiple Vitamin (MULTIVITAMIN WITH  MINERALS) TABS tablet Take 1 tablet by mouth daily. Centrum Silver Women    [provider]  Multiple Vitamins-Minerals (HAIR SKIN AND NAILS FORMULA) TABS Take 1 tablet by mouth daily. 10/17/18   [provider]  nitroGLYCERIN (NITROSTAT) 0.4 MG SL tablet Place 0.4 mg under the tongue every 5 (five) minutes as needed for chest pain.     [provider]  NOVOLIN 70/30 (70-30) 100 UNIT/ML injection Inject 25-30 Units into the skin 3 (three) times daily. 03/14/22   [provider]  NOVOLOG FLEXPEN 100 UNIT/ML FlexPen Inject 55 Units into the skin 3 (three) times daily with meals as needed for high blood sugar. As needed for blood sugar over 150. Add 1 unit for every point over 150 11/15/21   [provider]  pantoprazole (PROTONIX) 40 MG tablet Take 1 tablet by mouth once daily 07/31/22   Richardo Priest, MD  pramipexole (MIRAPEX) 1 MG tablet Take 1 mg by mouth at bedtime.    [provider]  pregabalin (LYRICA) 75 MG capsule Take 75 mg by mouth at bedtime. 09/24/21   [provider]  rosuvastatin (CRESTOR) 10 MG tablet Take 10 mg by mouth daily.    [provider]  torsemide (DEMADEX) 20 MG tablet Take one tablet by mouth once daily on Sunday, Tuesday, Thursday, and Saturday. Then take one tablet by mouth twice daily on Monday, Wednesday, and Friday. 10/31/22   Richardo Priest, MD  TRESIBA FLEXTOUCH 200 UNIT/ML FlexTouch Pen Inject 110 Units into the skin daily. 11/15/21   [provider]  valACYclovir (VALTREX) 1000 MG tablet Take 1,000 mg by mouth See admin instructions. Take one tablet (1000 mg) by mouth twice daily for 2 days, then take one tablet (1000 mg) daily for 2 days - as needed for fever blisters    [provider]     Family History  Problem Relation Age of Onset   Lung cancer Father        smoked   Hypertension Father    Stroke Father    Heart failure Mother    Bone cancer Sister    Asthma Sister      Social History   Socioeconomic History   Marital status: Married    Spouse name: Not on file   Number of children: Not on file   Years of education: Not on file   Highest education level: Not on file  Occupational History   Occupation: Archivist  Tobacco Use  Smoking status: Never   Smokeless tobacco: Never   Tobacco comments:    Prior secondhand smoke  Vaping Use   Vaping Use: Never used  Substance and Sexual Activity   Alcohol use: Yes    Alcohol/week: 0.0 standard drinks of alcohol    Comment: Rarely   Drug use: No   Sexual activity: Not on file  Other Topics Concern   Not on file  Social History Narrative   Not on file   Social Determinants of Health   Financial Resource Strain: Not on file  Food Insecurity: Not on file  Transportation Needs: Not on file  Physical Activity: Not on file  Stress: Not on file  Social Connections: Not on file     Review of Systems: A 12 point ROS discussed and pertinent positives are indicated in the HPI above.  All other systems are negative.  Review of Systems  Constitutional:  Positive for fever (None currently, low grade fever at home yesterday). Negative for chills.  Respiratory:  Negative for cough and shortness of breath.   Cardiovascular:  Negative for chest pain.  Gastrointestinal:  Negative for abdominal pain, diarrhea, nausea and vomiting.  Musculoskeletal:  Positive for back pain. Negative for gait problem and neck pain.  Skin:  Negative for rash and wound.  Neurological:  Negative for dizziness, weakness, light-headedness, numbness and headaches.    Vital Signs: BP 126/63 (BP Location: Left Arm)   Pulse 90   Temp 98.4 F (36.9 C) (Oral)   Resp 16   Ht '5\' 2"'$  (1.575 m)   Wt 207 lb 14.3 oz (94.3 kg)   SpO2 92%   BMI 38.02 kg/m   Physical Exam Vitals and nursing note reviewed.  Constitutional:      General: She is not in acute distress. HENT:     Head: Normocephalic.      Mouth/Throat:     Mouth: Mucous membranes are moist.     Pharynx: Oropharynx is clear. No oropharyngeal exudate or posterior oropharyngeal erythema.  Cardiovascular:     Rate and Rhythm: Normal rate and regular rhythm.  Pulmonary:     Effort: Pulmonary effort is normal.     Breath sounds: Normal breath sounds.  Abdominal:     General: There is no distension.     Palpations: Abdomen is soft.     Tenderness: There is no abdominal tenderness.  Skin:    General: Skin is warm and dry.  Neurological:     Mental Status: She is alert and oriented to person, place, and time.  Psychiatric:        Mood and Affect: Mood normal.        Behavior: Behavior normal.        Thought Content: Thought content normal.        Judgment: Judgment normal.      MD Evaluation Airway: WNL Heart: WNL Abdomen: WNL Chest/ Lungs: WNL ASA  Classification: 3 Mallampati/Airway Score: Two   Imaging: MR THORACIC SPINE W WO CONTRAST  Result Date: 12/05/2022 CLINICAL DATA:  Acute osteomyelitis. EXAM: MRI THORACIC WITHOUT AND WITH CONTRAST TECHNIQUE: Multiplanar and multiecho pulse sequences of the thoracic spine were obtained without and with intravenous contrast. CONTRAST:  1m GADAVIST GADOBUTROL 1 MMOL/ML IV SOLN COMPARISON:  CT abdomen pelvis dated November 30, 2022. MRI thoracic spine dated June 16, 2021. MRI lumbar spine dated November 09, 2020. FINDINGS: Alignment:  Trace retrolisthesis at T11-T12. Vertebrae: Prominent abnormal fluid and enhancement of the T11-T12 disc space with  adjacent endplate erosion and diffuse marrow edema and enhancement involving the T11 and T12 vertebral bodies, consistent with osteomyelitis discitis. No acute fracture or suspicious bone lesion. Partially visualized lumbar fusion hardware. Cord: Normal signal and morphology. Dorsal epidural enhancement from T8-T9 through T12-L1. Ventral epidural enhancement from T10-T11 through L1-L2. No discrete epidural fluid collection. No  abnormal intrathecal enhancement. Paraspinal and other soft tissues: Paravertebral inflammatory changes at T11-T12. No fluid collection. Spinal cord stimulator entering the dorsal epidural space at T8-T9 with the lead tips at T6. Epidural enhancement around the spinal cord stimulator lead tips. Soft tissue edema and enhancement surrounding the spinal stimulator leads in the superficial soft tissues posterior to the epidural insertion site. No fluid collection. Low T1 and T2 signal artifact in the thoracic paraspinous muscles is unchanged compared to prior study. Disc levels: T1-T2: Negative disc. Mild bilateral facet arthropathy. No stenosis. T2-T3: Small broad-based posterior disc protrusion and mild bilateral facet arthropathy. No stenosis. T3-T4: Small right paracentral disc protrusion. Mild bilateral facet arthropathy. No stenosis. T4-T5: Mild disc bulging.  No stenosis. T5-T6: Small circumferential disc osteophyte complex.  No stenosis. T6-T7: Mild disc bulging.  No stenosis. T7-T8: Mild disc bulging.  No stenosis. T8-T9: Mild disc bulging.  No stenosis. T9-T10: Negative. T10-T11: Mild disc bulging.  No stenosis. T11-T12: Discitis osteomyelitis. Bilateral facet arthropathy. Moderate spinal canal stenosis. Moderate to severe left and severe right neuroforaminal stenosis. IMPRESSION: 1. 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 Ventral epidural enhancement/phlegmon extending from T10-T11 through L1-L2. Dorsal epidural enhancement/phlegmon from T8-T9 through L1-L2. No discrete epidural or paravertebral abscess. 2. Dorsal epidural enhancement extends to and surrounds the spinal cord stimulator leads. There is also some superficial soft tissue swelling and enhancement surrounding the spinal cord stimulator leads in the posterior midline upper back. Some of this may be residual postsurgical changes given recent placement of the stimulator two and a half months ago, but infected spinal cord stimulator leads should be  considered. 3. 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 These results will be called to the ordering clinician or representative by the Radiologist Assistant, and communication documented in the PACS or [employer]. Electronically Signed   By: Titus Dubin M.D.   On: 12/05/2022 14:23    Labs:  CBC: Recent Labs    12/05/22 1807 12/06/22 0440  WBC 6.3 7.0  HGB 10.4* 9.5*  HCT 32.5* 29.5*  PLT 128* 142*    COAGS: Recent Labs    12/05/22 1807  INR 1.0    BMP: Recent Labs    12/05/22 1807 12/06/22 0710  NA 137 135  K 3.8 3.7  CL 97* 98  CO2 29 29  GLUCOSE 261* 155*  BUN 13 13  CALCIUM 9.1 8.7*  CREATININE 1.03* 0.89  GFRNONAA >60 >60    LIVER FUNCTION TESTS: Recent Labs    12/05/22 1807 12/06/22 0710  BILITOT 0.6 0.6  AST 46* 34  ALT 32 25  ALKPHOS 179* 153*  PROT 7.2 6.6  ALBUMIN 3.1* 2.8*    TUMOR MARKERS: No results for input(s): "AFPTM", "CEA", "CA199", "CHROMGRNA" in the last 8760 hours.  Assessment and Plan:  67 y/o F with history of multiple lumbar surgeries dating back to 2012 and recent spinal cord stimulator placement 09/2022 by neurosurgery (Dr. Sherley Bounds) found to have acute discitis/osteomyelitis at T11-T12 on outpatient MRI yesterday. She has been admitted for further evaluation and IR has been consulted for T11-T12 disc aspiration to further direct care.  Patient has been  NPO since midnight except for sips with meds, no anticoagulation. Afebrile, WBC 7.0, hgb 9.5 (baseline ~10), plt 142, INR 1.0. Plan to proceed with disc aspiration under moderate sedation today with Dr. Estanislado Pandy.  Risks and benefits of T11-T12 disc aspiration was discussed with the patient and/or patient's family including, but not limited to bleeding, infection, damage to adjacent structures or low yield requiring additional tests.  All of the questions were answered and there is agreement to proceed.  Consent  signed and in chart.  Thank you for this interesting consult.  I greatly enjoyed meeting Katelyn [name] and look forward to participating in their care.  A copy of this report was sent to the requesting provider on this date.  Electronically Signed: Joaquim Nam, PA-C 12/06/2022, 10:39 AM   I spent a total of 40 Minutes in face to face in clinical consultation, greater than 50% of which was counseling/coordinating care for T11-T12 discitis.

## 2022-12-06 NOTE — ED Notes (Signed)
ED TO INPATIENT HANDOFF REPORT  ED Nurse Name and Phone #: Gregary Signs [phone]  S Name/Age/Gender Katelyn [name] 67 y.o. female Room/Bed: 041C/041C  Code Status   Code Status: Full Code  Home/SNF/Other Home Patient oriented to: self Is this baseline? Yes   Triage Complete: Triage complete  Chief Complaint Diskitis [M46.40]  Triage Note Pt advised to come to ED by Dr. Ronnald Ramp for admission d/t infection s/p having a spinal nerve stimulator implanted beginning of December.    Allergies Allergies  Allergen Reactions   Atorvastatin Nausea And Vomiting and Other (See Comments)    MYALGIAS   Talwin [Pentazocine] Other (See Comments)    headache   Other Nausea Only    UNSPECIFIED Anesthesia    Level of Care/Admitting Diagnosis ED Disposition     ED Disposition  Admit   Condition  --   [place]: [facility] [[id number]]  Level of Care: Telemetry Medical [104]  May admit patient to [facility] or [facility] if equivalent level of care is available:: No  Covid Evaluation: Asymptomatic - no recent exposure (last 10 days) testing not required  Diagnosis: Diskitis [id number]  Admitting Physician: Rhetta Mura [id number]  Attending Physician: Rhetta Mura [id number]  Certification:: I certify this patient will need inpatient services for at least 2 midnights  Estimated Length of Stay: 2          B Medical/Surgery History Past Medical History:  Diagnosis Date   Anxiety    Arthritis    Bursitis of right hip    CAD (coronary artery disease)    Cardiac catheterization June 2014 in [place] - 50% circumflex stenosis   Chest pain, neg MI, stable CAD non obstructive on cath 10/05/20 10/04/2020   Chronic diastolic heart failure ([place]) 08/20/2017   Chronic kidney disease, stage 3 ([place])    does not see nephrologist   Chronic low back pain without sciatica 03/14/2016   CKD (chronic kidney disease), stage III ([place]) [id number]   Complication of  anesthesia    Cough 04/19/2017   Overview:  Last Assessment & Plan:  Formatting of this note may be different from the original. Cough - ? ACE related with AR triggers   Plan  Patient Instructions  Discuss with your primary doctor that lisinopril pain, need making your cough worse. May use Mucinex DM twice daily as needed for cough and congestion Zyrtec 10 mg at bedtime as needed for drainage Saline nasal spray as needed. Lab tests today Activity as tolerated. Follow with Dr. [name] in 3-4 months and As needed   Please contact office for sooner follow up if symptoms do not improve or worsen or seek emergency care    Depression    Dyspnea    with exertion   " lazy lung" - per  [name] from back issues- 06/2016   Elevated liver enzymes 12/05/2016   Essential hypertension    GERD (gastroesophageal reflux disease)    Gout 03/14/2016   Greater trochanteric bursitis of right hip 02/02/2012   H/O hiatal hernia    Heart murmur    History of blood transfusion 2016   History of esophageal stricture 10/07/2020   History of kidney stones    Hypercholesterolemia    Hypertensive heart disease with heart failure ([place]) 01/01/2017   Hypoxia 10/07/2020   Iliotibial band syndrome of right side 02/02/2012   Iron deficiency anemia due to chronic blood loss 03/14/2016   LBBB (left bundle branch block)  01/01/2017   Left bundle branch block    Leg weakness 10/07/2020   Lumbar stenosis    Meralgia paraesthetica 12/05/2016   Mild CAD 11/24/2015   Morbid obesity ([place]) 10/07/2020   Neuropathy    OSA (obstructive sleep apnea) 05/24/2016   Overview:  Managed PULM   PONV (postoperative nausea and vomiting)    "no N/V with patch"   Restless leg syndrome    S/P lumbar laminectomy 11/26/2015   S/P lumbar spinal fusion 08/29/2016   Tinnitus 12/05/2016   Type 2 diabetes mellitus ([place])    UTI (urinary tract infection) 10/07/2020   Past Surgical History:  Procedure Laterality Date   ABDOMINAL HYSTERECTOMY  1983   APPENDECTOMY   Age 89   BACK SURGERY     FIRST LUMBAR FUSION/ SURGERY APRIL 2012 AND FUSION WITH INSTRUMENTATION SEPT 2012   CARDIAC CATHETERIZATION     x 2   CARPAL TUNNEL RELEASE     bil   CHOLECYSTECTOMY  1990's   COLONOSCOPY  12/12/2017   Colonic polyp status post polypectomy. Mild sigmoid diverticulosis. Otherwise normal colonoscopy to terminal ileum.   CYSTO EXTRACTION KIDNEY STONES     ESOPHAGOGASTRODUODENOSCOPY  12/12/2017   Small hiatal hernia. Mild gastritis. Status post esophageal dilatation.   EXCISION/RELEASE BURSA HIP  02/02/2012   Procedure: EXCISION/RELEASE BURSA HIP;  Surgeon: Tobi Bastos, MD;  Location: [facility];  Service: Orthopedics;  Laterality: Right;  Right Hip Bursectomy   EYE SURGERY Bilateral    cataracts   KIDNEY STONE SURGERY  2008   Left knee surgery x 2  1996   reconstruction   LUMBAR DISC SURGERY  08/2016   LUMBAR LAMINECTOMY/DECOMPRESSION MICRODISCECTOMY Right 11/26/2015   Procedure: Extraforaminal Microdiscectomy  - Lumbar two-three- right;  Surgeon: Eustace Moore, MD;  Location: [facility];  Service: Neurosurgery;  Laterality: Right;  right    LUMBAR WOUND DEBRIDEMENT N/A 10/25/2016   Procedure: Lumbar wound revision;  Surgeon: Eustace Moore, MD;  Location: [facility];  Service: Neurosurgery;  Laterality: N/A;  Lumbar wound revision   Right shoulder surgery  2010   spur   RIGHT/LEFT HEART CATH AND CORONARY ANGIOGRAPHY N/A 10/05/2020   Procedure: RIGHT/LEFT HEART CATH AND CORONARY ANGIOGRAPHY;  Surgeon: Martinique, Peter M, MD;  Location: [facility];  Service: Cardiovascular;  Laterality: N/A;     A IV Location/Drains/Wounds Patient Lines/Drains/Airways Status     Active Line/Drains/Airways     Name Placement date Placement time Site Days   Peripheral IV 12/05/22 20 G Posterior;Right Hand 12/05/22  1858  Hand  1   Peripheral IV 12/05/22 20 G Right Antecubital 12/05/22  2110  Antecubital  1   Incision 02/02/12 Hip Right 02/02/12  1552  -- 3960   Incision  (Closed) 11/26/15 Back Other (Comment) 11/26/15  1128  -- 2567   Incision (Closed) 08/29/16 Back 08/29/16  0913  -- 2290   Incision (Closed) 10/25/16 Back 10/25/16  1204  -- 2233            Intake/Output Last 24 hours No intake or output data in the 24 hours ending 12/06/22 0421  Labs/Imaging Results for orders placed or performed during the hospital encounter of 12/05/22 (from the past 48 hour(s))  Lactic acid, plasma     Status: Abnormal   Collection Time: 12/05/22  6:00 PM  Result Value Ref Range   Lactic Acid, Venous 2.7 (HH) 0.5 - 1.9 mmol/L    Comment: CRITICAL RESULT CALLED TO, READ BACK  BY AND VERIFIED WITH A JAMES RN 12/05/2022 1930 Alvina Chou Performed at [facility], [place] [address] [place], [place] [address]   Comprehensive metabolic panel     Status: Abnormal   Collection Time: 12/05/22  6:07 PM  Result Value Ref Range   Sodium 137 135 - 145 mmol/L   Potassium 3.8 3.5 - 5.1 mmol/L   Chloride 97 (L) 98 - 111 mmol/L   CO2 29 22 - 32 mmol/L   Glucose, Bld 261 (H) 70 - 99 mg/dL    Comment: Glucose reference range applies only to samples taken after fasting for at least 8 hours.   BUN 13 8 - 23 mg/dL   Creatinine, Ser 1.03 (H) 0.44 - 1.00 mg/dL   Calcium 9.1 8.9 - 10.3 mg/dL   Total Protein 7.2 6.5 - 8.1 g/dL   Albumin 3.1 (L) 3.5 - 5.0 g/dL   AST 46 (H) 15 - 41 U/L   ALT 32 0 - 44 U/L   Alkaline Phosphatase 179 (H) 38 - 126 U/L   Total Bilirubin 0.6 0.3 - 1.2 mg/dL   GFR, Estimated >60 >60 mL/min    Comment: (NOTE) Calculated using the CKD-EPI Creatinine Equation (2021)    Anion gap 11 5 - 15    Comment: Performed at [facility], [place] [address], [place], [place] [address]  CBC with Differential     Status: Abnormal   Collection Time: 12/05/22  6:07 PM  Result Value Ref Range   WBC 6.3 4.0 - 10.5 K/uL   RBC 3.44 (L) 3.87 - 5.11 MIL/uL   Hemoglobin 10.4 (L) 12.0 - 15.0 g/dL   HCT 32.5 (L) 36.0 - 46.0 %   MCV 94.5 80.0 - 100.0 fL   MCH 30.2 26.0  - 34.0 pg   MCHC 32.0 30.0 - 36.0 g/dL   RDW 14.9 11.5 - 15.5 %   Platelets 128 (L) 150 - 400 K/uL   nRBC 0.0 0.0 - 0.2 %   Neutrophils Relative % 75 %   Neutro Abs 4.8 1.7 - 7.7 K/uL   Lymphocytes Relative 13 %   Lymphs Abs 0.8 0.7 - 4.0 K/uL   Monocytes Relative 6 %   Monocytes Absolute 0.4 0.1 - 1.0 K/uL   Eosinophils Relative 4 %   Eosinophils Absolute 0.2 0.0 - 0.5 K/uL   Basophils Relative 1 %   Basophils Absolute 0.0 0.0 - 0.1 K/uL   Immature Granulocytes 1 %   Abs Immature Granulocytes 0.03 0.00 - 0.07 K/uL    Comment: Performed at [facility] [address], [place], [place] [address]  Protime-INR     Status: None   Collection Time: 12/05/22  6:07 PM  Result Value Ref Range   Prothrombin Time 12.8 11.4 - 15.2 seconds   INR 1.0 0.8 - 1.2    Comment: (NOTE) INR goal varies based on device and disease states. Performed at [facility], [place] [address], [place], [place] [address]   Sedimentation rate     Status: Abnormal   Collection Time: 12/05/22  6:07 PM  Result Value Ref Range   Sed Rate 63 (H) 0 - 22 mm/hr    Comment: Performed at [facility] [address], [place], [place] [address]  Magnesium     Status: Abnormal   Collection Time: 12/05/22  6:07 PM  Result Value Ref Range   Magnesium 1.3 (L) 1.7 - 2.4 mg/dL    Comment:  Performed at [facility], [place] [address], [place], [place] [address]  Lactic acid, plasma     Status: None   Collection Time: 12/05/22  9:10 PM  Result Value Ref Range   Lactic Acid, Venous 1.8 0.5 - 1.9 mmol/L    Comment: Performed at [facility] [address], [place], [place] [address]  CBG monitoring, ED     Status: Abnormal   Collection Time: 12/05/22 11:32 PM  Result Value Ref Range   Glucose-Capillary 218 (H) 70 - 99 mg/dL    Comment: Glucose reference range applies only to samples taken after fasting for at least 8 hours.   MR THORACIC SPINE W WO CONTRAST  Result Date: 12/05/2022 CLINICAL  DATA:  Acute osteomyelitis. EXAM: MRI THORACIC WITHOUT AND WITH CONTRAST TECHNIQUE: Multiplanar and multiecho pulse sequences of the thoracic spine were obtained without and with intravenous contrast. CONTRAST:  98m GADAVIST GADOBUTROL 1 MMOL/ML IV SOLN COMPARISON:  CT abdomen pelvis dated November 30, 2022. MRI thoracic spine dated June 16, 2021. MRI lumbar spine dated November 09, 2020. FINDINGS: Alignment:  Trace retrolisthesis at T11-T12. Vertebrae: Prominent abnormal fluid and enhancement of the T11-T12 disc space with adjacent endplate erosion and diffuse marrow edema and enhancement involving the T11 and T12 vertebral bodies, consistent with osteomyelitis discitis. No acute fracture or suspicious bone lesion. Partially visualized lumbar fusion hardware. Cord: Normal signal and morphology. Dorsal epidural enhancement from T8-T9 through T12-L1. Ventral epidural enhancement from T10-T11 through L1-L2. No discrete epidural fluid collection. No abnormal intrathecal enhancement. Paraspinal and other soft tissues: Paravertebral inflammatory changes at T11-T12. No fluid collection. Spinal cord stimulator entering the dorsal epidural space at T8-T9 with the lead tips at T6. Epidural enhancement around the spinal cord stimulator lead tips. Soft tissue edema and enhancement surrounding the spinal stimulator leads in the superficial soft tissues posterior to the epidural insertion site. No fluid collection. Low T1 and T2 signal artifact in the thoracic paraspinous muscles is unchanged compared to prior study. Disc levels: T1-T2: Negative disc. Mild bilateral facet arthropathy. No stenosis. T2-T3: Small broad-based posterior disc protrusion and mild bilateral facet arthropathy. No stenosis. T3-T4: Small right paracentral disc protrusion. Mild bilateral facet arthropathy. No stenosis. T4-T5: Mild disc bulging.  No stenosis. T5-T6: Small circumferential disc osteophyte complex.  No stenosis. T6-T7: Mild disc bulging.   No stenosis. T7-T8: Mild disc bulging.  No stenosis. T8-T9: Mild disc bulging.  No stenosis. T9-T10: Negative. T10-T11: Mild disc bulging.  No stenosis. T11-T12: Discitis osteomyelitis. Bilateral facet arthropathy. Moderate spinal canal stenosis. Moderate to severe left and severe right neuroforaminal stenosis. IMPRESSION: 1. 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 Ventral epidural enhancement/phlegmon extending from T10-T11 through L1-L2. Dorsal epidural enhancement/phlegmon from T8-T9 through L1-L2. No discrete epidural or paravertebral abscess. 2. Dorsal epidural enhancement extends to and surrounds the spinal cord stimulator leads. There is also some superficial soft tissue swelling and enhancement surrounding the spinal cord stimulator leads in the posterior midline upper back. Some of this may be residual postsurgical changes given recent placement of the stimulator two and a half months ago, but infected spinal cord stimulator leads should be considered. 3. 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 These results will be called to the ordering clinician or representative by the Radiologist Assistant, and communication documented in the PACS or [employer] Electronically Signed   By: WTitus DubinM.D.   On: 12/05/2022 14:23    Pending Labs Unresulted Labs (From admission, onward)  Start     Ordered   12/06/22 0500  CBC with Differential/Platelet  Tomorrow morning,   R        12/05/22 2024   12/06/22 0500  Comprehensive metabolic panel  Tomorrow morning,   R        12/05/22 2024   12/06/22 0500  Magnesium  Tomorrow morning,   R        12/05/22 2024   12/06/22 0500  Brain natriuretic peptide  Tomorrow morning,   R        12/06/22 0340   12/06/22 0341  Type and screen [place]  Once,   R       Comments: [place]    12/06/22 0340   12/06/22 0339  Rapid urine drug screen (hospital performed)   Add-on,   AD        12/06/22 0338   12/05/22 2111  Hemoglobin A1c  Add-on,   AD       Comments: To assess prior glycemic control    12/05/22 2110   12/05/22 2100  C-reactive protein  Once,   R        12/05/22 2100   12/05/22 1800  Culture, blood (Routine x 2)  BLOOD CULTURE X 2,   R      12/05/22 1759   12/05/22 1800  Urinalysis, Routine w reflex microscopic -Urine, Clean Catch  Once,   URGENT       Question:  Specimen Source  Answer:  Urine, Clean Catch   12/05/22 1759            Vitals/Pain Today's Vitals   12/06/22 0205 12/06/22 0214 12/06/22 0300 12/06/22 0400  BP: (!) 146/65  133/62 126/67  Pulse: 93  90 92  Resp: '16  11 13  '$ Temp: 98 F (36.7 C)     TempSrc: Oral     SpO2: 99%  94% 96%  Weight:      Height:      PainSc:  1       Isolation Precautions No active isolations  Medications Medications  acetaminophen (TYLENOL) tablet 650 mg (has no administration in time range)    Or  acetaminophen (TYLENOL) suppository 650 mg (has no administration in time range)  melatonin tablet 3 mg (has no administration in time range)  naloxone [facility]) injection 0.4 mg (has no administration in time range)  HYDROmorphone (DILAUDID) injection 0.5 mg (0.5 mg Intravenous Given 12/06/22 0402)  ondansetron (ZOFRAN) injection 4 mg (has no administration in time range)  lactated ringers infusion ( Intravenous New Bag/Given 12/05/22 2103)  insulin glargine-yfgn (SEMGLEE) injection 50 Units (50 Units Subcutaneous Given 12/05/22 2205)  insulin aspart (novoLOG) injection 0-9 Units (3 Units Subcutaneous Given 12/05/22 2336)  DULoxetine (CYMBALTA) DR capsule 60 mg (60 mg Oral Given 12/05/22 2204)  pantoprazole (PROTONIX) EC tablet 40 mg (has no administration in time range)  pregabalin (LYRICA) capsule 75 mg (75 mg Oral Given 12/05/22 2202)  pramipexole (MIRAPEX) tablet 1 mg (1 mg Oral Given 12/05/22 2310)  rosuvastatin (CRESTOR) tablet 10 mg (10 mg Oral Given 12/05/22 2204)  magnesium sulfate  IVPB 2 g 50 mL (2 g Intravenous New Bag/Given 12/06/22 0359)  lactated ringers bolus 1,000 mL (1,000 mLs Intravenous New Bag/Given 12/05/22 2003)    Mobility walks     Focused Assessments Neuro Assessment Handoff:  Swallow screen pass? Yes          Neuro Assessment: Within Defined Limits Neuro Checks:  Has TPA been given? No If patient is a Neuro Trauma and patient is going to OR before floor call report to [place] nurse: [phone] or [phone]   R Recommendations: See Admitting Provider Note  Report given to:   Additional Notes: PT NPO for IR procedure this am. Magnesium running right now, then due for am labs. Will get these done before I bring her up.

## 2022-12-06 NOTE — Procedures (Signed)
INR.  Status post T11-T12  disc aspiration using fluoroscopic guidance.  2 passes made via right posterior lateral approach..  Approximately 5 cc of thick bloody aspirate obtained and sent for microbiological analysis.  No acute complications.  Patient tolerated the procedure well.  Arlean Hopping MD.

## 2022-12-06 NOTE — Progress Notes (Signed)
Pharmacy Antibiotic Note  Katelyn [name] is a 67 y.o. female admitted on 12/05/2022 with discitis/osteomyelitis. Patient is s/p disc aspiration at T11-T12 on 12/06/22. Pharmacy has been consulted for vancomycin and cefepime dosing.  WBC wnl, afebrile, Scr 0.89  Plan: Vancomycin '2000mg'$  IV x1, followed by Vancomycin '1000mg'$  IV q24 hours (eAUC 463, Scr 0.89, Vd 0.5) Cefepime 2g IV q8 hours F/u culture data, renal function, ability to narrow antibiotics  Height: '5\' 2"'$  (157.5 cm) Weight: 94.3 kg (207 lb 14.3 oz) IBW/kg (Calculated) : 50.1  Temp (24hrs), Avg:98.5 F (36.9 C), Min:97.9 F (36.6 C), Max:99.5 F (37.5 C)  Recent Labs  Lab 12/05/22 1800 12/05/22 1807 12/05/22 2110 12/06/22 0440 12/06/22 0710  WBC  --  6.3  --  7.0  --   CREATININE  --  1.03*  --   --  0.89  LATICACIDVEN 2.7*  --  1.8  --   --     Estimated Creatinine Clearance: 67.5 mL/min (by C-G formula based on SCr of 0.89 mg/dL).    Allergies  Allergen Reactions   Atorvastatin Nausea And Vomiting and Other (See Comments)    MYALGIAS   Talwin [Pentazocine] Other (See Comments)    headache   Other Nausea Only    UNSPECIFIED Anesthesia    Antimicrobials this admission: Vancomycin 2/28 >>  Cefepime 2/28 >>   Dose adjustments this admission:  Microbiology results: 2/28 BCx:  2/28 WCx:    Thank you for allowing pharmacy to be a part of this patient's care.  Dimple Nanas, PharmD, BCPS 12/06/2022 1:20 PM

## 2022-12-06 NOTE — TOC Initial Note (Signed)
Transition of Care [facility]) - Initial/Assessment Note    Patient Details  Name: Katelyn [name] MRN: [id number] Date of Birth: 05/17/1956  Transition of Care [facility]) CM/SW Contact:    Sharin Mons, RN Phone Number: [phone] 12/06/2022, 12:49 PM  Clinical Narrative:                Presents with acute discitis/ osteomyelitis @ T11-T12. From home with supportive spouse. PTA independent with ADL',s. Uses RW with ambulation. Pt without RX medication or  transportation concerns. Transition of Care [facility]) has reviewed patient no present  TOC needs have been identified. We will continue to monitor patient advancement through interdisciplinary progression rounds. If new patient transition needs arise, please place a TOC consult.    Expected Discharge Plan: Home/Self Care Barriers to Discharge: Continued Medical Work up   Patient Goals and CMS Choice            Expected Discharge Plan and Services                                              Prior Living Arrangements/Services     Patient language and need for interpreter reviewed:: Yes        Need for Family Participation in Patient Care: Yes (Comment) Care giver support system in place?: Yes (comment)   Criminal Activity/Legal Involvement Pertinent to Current Situation/Hospitalization: No - Comment as needed  Activities of Daily Living      Permission Sought/Granted                  Emotional Assessment       Orientation: : Oriented to Self, Oriented to Place, Oriented to  Time, Oriented to Situation Alcohol / Substance Use: Not Applicable Psych Involvement: No (comment)  Admission diagnosis:  Diskitis [M46.40] Discitis, unspecified spinal region [M46.40] Patient Active Problem List   Diagnosis Date Noted   Lactic acidosis 12/06/2022   Hypomagnesemia 12/06/2022   Anemia of chronic disease 12/06/2022   Diskitis 12/05/2022   Acute thoracic back pain 06/22/2022   Other long term  (current) drug therapy 06/22/2022   Lumbar spondylosis 06/22/2022   Thoracic spondylosis 06/22/2022   Bilateral sacroiliitis ([place]) 06/22/2022   Chronic, continuous use of opioids 05/31/2022   Pain medication agreement 05/02/2022   Postlaminectomy syndrome of lumbar region 05/02/2022   Radiculopathy of thoracolumbar region 05/02/2022   Class 2 severe obesity due to excess calories with serious comorbidity and body mass index (BMI) of 37.0 to 37.9 in adult [facility]) 05/02/2022   Mastodynia 04/13/2022   Peripheral artery disease ([place]) 07/11/2021   COVID-19 virus infection 06/21/2021   Rib pain on left side 06/06/2021   Abdominal pain 06/05/2021   Diabetes 1.5, 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   GAD (generalized anxiety disorder) 11/25/2020   Anxiety and depression 11/03/2020   DM2 (diabetes mellitus, type 2) (HCC)    Restless leg syndrome    PONV (postoperative nausea and vomiting)    Neuropathy    Lumbar stenosis    Left bundle branch block    Hypercholesterolemia    Heart murmur    History of kidney stones    H/O hiatal hernia    Essential hypertension    Dyspnea    Chronic depression    Complication of anesthesia    Chronic kidney disease, stage  3 ([place])    CAD (coronary artery disease)    Bursitis of right hip    Arthritis    Anxiety    Morbid obesity ([place]) 10/07/2020   Hypoxia 10/07/2020   History of esophageal stricture 10/07/2020   CKD (chronic kidney disease), stage III ([place]) 10/07/2020   Leg weakness 10/07/2020   UTI (urinary tract infection) 10/07/2020   Chronic hypoxemic respiratory failure ([place]) 10/07/2020   Pulmonary hypertension ([place]) 10/06/2020   Chest pain, neg MI, stable CAD non obstructive on cath 10/05/20 10/04/2020   Hardening of the aorta (main artery of the heart) ([place]) 11/25/2019   Hypertensive heart disease with congestive heart failure and chronic kidney disease ([place]) 11/25/2019   Shortness of breath  06/11/2019   Fatty liver 04/08/2019   Migraine 04/08/2019   History of chronic kidney disease 04/08/2019   Acute non-recurrent sinusitis 10/12/2017   Chronic diastolic CHF (congestive heart failure) ([place]) 08/20/2017   Genetic testing 08/17/2017   Family history of ovarian cancer 08/17/2017   Family history of breast cancer 08/17/2017   Cough 04/19/2017   DOE (dyspnea on exertion) 04/19/2017   Diabetic peripheral neuropathy associated with type 2 diabetes mellitus ([place]) 01/16/2017   Hypertensive heart disease with heart failure ([place]) 01/01/2017   Hyperlipidemia 01/01/2017   LBBB (left bundle branch block) 01/01/2017   Elevated liver enzymes 12/05/2016   Meralgia paraesthetica 12/05/2016   Tinnitus 12/05/2016   Spinal stenosis of lumbar region with neurogenic claudication 12/05/2016   Wound dehiscence 10/23/2016   S/P lumbar spinal fusion 08/29/2016   OSA (obstructive sleep apnea) 05/24/2016   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   Chronic low back pain without sciatica 03/14/2016   GERD (gastroesophageal reflux disease) 03/14/2016   Gout 03/14/2016   Iron deficiency anemia due to chronic blood loss 03/14/2016   Type 2 diabetes mellitus without complication, with long-term current use of insulin ([place]) 03/14/2016   Cannot sleep 12/07/2015   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[place]) 12/07/2015   Restless leg 12/07/2015   S/P lumbar laminectomy 11/26/2015   Postprocedural state 11/26/2015   Mild CAD 11/24/2015   Lumbar radiculopathy 10/15/2015   History of blood transfusion 2016   Kidney stone 04/20/2014   Stricture of esophagus 04/20/2014   Greater trochanteric bursitis of right hip 02/02/2012   Iliotibial band syndrome of right side 02/02/2012   Iliotibial band syndrome 02/02/2012   Bursitis, trochanteric 02/02/2012   PCP:  Algis Greenhouse, MD Pharmacy:   [facility] [address] [place] - [id number] [place] [id number] Phone:  [phone] Fax: [phone]     Social Determinants of Health (SDOH) Social History: SDOH Screenings   Tobacco Use: Low Risk  (12/05/2022)   SDOH Interventions:     Readmission Risk Interventions     No data to display

## 2022-12-06 NOTE — Progress Notes (Addendum)
PROGRESS NOTE    Katelyn [name]  [id number] DOB: Apr 21, 1956 DOA: 12/05/2022 PCP: Algis Greenhouse, MD    Brief Narrative:   Katelyn [name] is a 67 y.o. female with past medical history significant for chronic diastolic congestive heart failure, essential hypertension, hyperlipidemia, anemia of chronic medical disease, type 2 diabetes mellitus, chronic low back pain s/p spinal stimulator (09/2022) who presented to [facility] ED on 2/27 under advisement by her neurosurgeon Dr. Ronnald Ramp for admission due to concerns for acute discitis/osteomyelitis noted on outpatient MRI.    Patient has been following with neurosurgery outpatient for chronic low back pain.  She underwent spine stimulator placement December 2023.  Given patient's persistent back pain repeat MRI T-spine was performed by Dr. Ronnald Ramp which showed evidence of acute discitis/osteomyelitis at the T11/T12 level without evidence of discrete epidural or paravertebral abscess.  Given these findings, Dr. Ronnald Ramp recommended patient present to the emergency department for further evaluation and management.  In the ED, temperature 99.5 F, HR 106, RR 20, BP 165/77, SpO2 96% on room air.  WBC 6.3, hemoglobin 10.4, platelets 128.  Sodium 137, potassium 3.8, chloride 97, CO2 29, glucose 261, BUN 13, creatinine 1.03.  AST 46, ALT 32, total bilirubin 0.6.  Lactic acid 2.7.  Neurosurgery was consulted who recommended medical admission and initiation of antibiotics.  Infectious disease was consulted and recommended holding IV antibiotics until biopsy performed by IR.  TRH consulted for admission for further evaluation management of acute discitis/osteomyelitis in the setting of spinal stimulator lead placement.  Assessment & Plan:    Acute discitis/osteomyelitis Lactic acidosis: resolved Lying to ED by direction of neurosurgery, Dr. Ronnald Ramp for persistent back pain with outpatient MRI findings concerning for acute osteomyelitis/discitis.  Patient is afebrile  without leukocytosis.  Complicated by recent placement of spinal nerve stimulator December 2023.  Neurosurgery was consulted who recommended admission to the medical service with IV antibiotics.  ID was consulted, Dr. Linus Salmons and holding antibiotics until IR aspiration/biopsy.  Patient underwent T11-T12 disc aspiration by IR on 2/28. -- WBC 6.3>7.0 -- ESR 63 -- CRP 1.9 -- Blood cultures x 2: pending -- Start vancomycin/cefepime, pharmacy consulted for dosing/monitoring -- Anticipate likely need of nerve stimulator leads removed given site of infection, deferring to neurosurgery -- CBC daily  Chronic diastolic congestive heart failure, compensated Essential hypertension --Carvedilol 6.25 mg p.o. twice daily --Torsemide 20 mg p.o. daily  Hyperlipidemia -- Crestor 10 mg p.o. daily  Type 2 diabetes mellitus Hemoglobin A1c 9.5 10/05/2020.  Home regimen includes metformin 500 mg p.o. twice daily, Tresiba 110 units subcutaneously daily, -- Update hemoglobin A1c -- Semglee 50u Whitney qhs -- Sensitive SSI for coverage -- CBGs  qAC/HS  Anemia of chronic medical disease Hemoglobin 9.5, stable.  Chronic low back pain With neurosurgery, Dr. Ronnald Ramp outpatient.  Recent spinal stimulator placement December 2023. -- Likely need spinal stimulator removal as above -- Oxycodone-acetaminophen 5-325 mg p.o. every 4 hours.  Moderate pain -- Dilaudid 0.5 mg IV every 2 hours as needed severe pain  GERD: -- Protonix 40 mg p.o. daily  Morbid obesity Body mass index is 38.02 kg/m.  Patient needs aggressive lifestyle changes/weight loss as this complicates all facets of care.  Outpatient follow-up with PCP.     DVT prophylaxis: SCDs Start: 12/05/22 2022    Code Status: Full Code Family Communication: No family present at bedside this morning  Disposition Plan:  Level of care: Telemetry Medical Status is: Inpatient Remains inpatient appropriate because: IV antibiotics  Consultants:  Neurosurgery,  Dr. Ronnald Ramp Infectious disease  Procedures:  T11-T12 disc aspiration, IR 2/28  Antimicrobials:  Vancomycin 2/28>> Cefepime 2/28>>   Subjective: Patient seen examined at bedside, resting calmly.  Lying in bed.  Awaiting IR disc aspiration this morning.  Continues with low back pain, moderately controlled with pain medication.  RN present at bedside.  Requesting start of her home torsemide.  No other questions or concerns at this time.  Denies headache, no dizziness, no chest pain, no fever/chills/night sweats, no nausea cefonicid diarrhea, no abdominal pain, no cough/congestion, no focal weakness, no fatigue, no paresthesias.  No acute events overnight per nursing staff.  Objective: Vitals:   12/06/22 0750 12/06/22 1211 12/06/22 1225 12/06/22 1230  BP: 126/63 (!) 157/77 137/74 (!) 154/83  Pulse: 90 87 85 83  Resp: '16 13 13 11  '$ Temp: 98.4 F (36.9 C)     TempSrc: Oral     SpO2: 92% 99% 99% 99%  Weight:      Height:        Intake/Output Summary (Last 24 hours) at 12/06/2022 1238 Last data filed at 12/06/2022 0516 Gross per 24 hour  Intake 821.53 ml  Output --  Net 821.53 ml   Filed Weights   12/05/22 2042  Weight: 94.3 kg    Examination:  Physical Exam: GEN: NAD, alert and oriented x 3, obese HEENT: NCAT, PERRL, EOMI, sclera clear, MMM PULM: CTAB w/o wheezes/crackles, normal respiratory effort, on room air CV: RRR w/o M/G/R GI: abd soft, NTND, NABS, no R/G/M MSK: no peripheral edema, muscle strength globally intact 5/5 bilateral upper/lower extremities NEURO: No focal deficits PSYCH: normal mood/affect    Data Reviewed: I have personally reviewed following labs and imaging studies  CBC: Recent Labs  Lab 12/05/22 1807 12/06/22 0440  WBC 6.3 7.0  NEUTROABS 4.8 4.5  HGB 10.4* 9.5*  HCT 32.5* 29.5*  MCV 94.5 95.5  PLT 128* [id number]*   Basic Metabolic Panel: Recent Labs  Lab 12/05/22 1807 12/06/22 0710  NA 137 135  K 3.8 3.7  CL 97* 98  CO2 29 29  GLUCOSE  261* 155*  BUN 13 13  CREATININE 1.03* 0.89  CALCIUM 9.1 8.7*  MG 1.3* 1.6*   GFR: Estimated Creatinine Clearance: 67.5 mL/min (by C-G formula based on SCr of 0.89 mg/dL). Liver Function Tests: Recent Labs  Lab 12/05/22 1807 12/06/22 0710  AST 46* 34  ALT 32 25  ALKPHOS 179* 153*  BILITOT 0.6 0.6  PROT 7.2 6.6  ALBUMIN 3.1* 2.8*   No results for input(s): "LIPASE", "AMYLASE" in the last 168 hours. No results for input(s): "AMMONIA" in the last 168 hours. Coagulation Profile: Recent Labs  Lab 12/05/22 1807  INR 1.0   Cardiac Enzymes: No results for input(s): "CKTOTAL", "CKMB", "CKMBINDEX", "TROPONINI" in the last 168 hours. BNP (last 3 results) No results for input(s): "PROBNP" in the last 8760 hours. HbA1C: No results for input(s): "HGBA1C" in the last 72 hours. CBG: Recent Labs  Lab 12/05/22 2332 12/06/22 0522 12/06/22 0750  GLUCAP 218* 150* 158*   Lipid Profile: No results for input(s): "CHOL", "HDL", "LDLCALC", "TRIG", "CHOLHDL", "LDLDIRECT" in the last 72 hours. Thyroid Function Tests: No results for input(s): "TSH", "T4TOTAL", "FREET4", "T3FREE", "THYROIDAB" in the last 72 hours. Anemia Panel: No results for input(s): "VITAMINB12", "FOLATE", "FERRITIN", "TIBC", "IRON", "RETICCTPCT" in the last 72 hours. Sepsis Labs: Recent Labs  Lab 12/05/22 1800 12/05/22 2110  LATICACIDVEN 2.7* 1.8    No results found for this  or any previous visit (from the past 240 hour(s)).       Radiology Studies: MR THORACIC SPINE W WO CONTRAST  Result Date: 12/05/2022 CLINICAL DATA:  Acute osteomyelitis. EXAM: MRI THORACIC WITHOUT AND WITH CONTRAST TECHNIQUE: Multiplanar and multiecho pulse sequences of the thoracic spine were obtained without and with intravenous contrast. CONTRAST:  58m GADAVIST GADOBUTROL 1 MMOL/ML IV SOLN COMPARISON:  CT abdomen pelvis dated November 30, 2022. MRI thoracic spine dated June 16, 2021. MRI lumbar spine dated November 09, 2020.  FINDINGS: Alignment:  Trace retrolisthesis at T11-T12. Vertebrae: Prominent abnormal fluid and enhancement of the T11-T12 disc space with adjacent endplate erosion and diffuse marrow edema and enhancement involving the T11 and T12 vertebral bodies, consistent with osteomyelitis discitis. No acute fracture or suspicious bone lesion. Partially visualized lumbar fusion hardware. Cord: Normal signal and morphology. Dorsal epidural enhancement from T8-T9 through T12-L1. Ventral epidural enhancement from T10-T11 through L1-L2. No discrete epidural fluid collection. No abnormal intrathecal enhancement. Paraspinal and other soft tissues: Paravertebral inflammatory changes at T11-T12. No fluid collection. Spinal cord stimulator entering the dorsal epidural space at T8-T9 with the lead tips at T6. Epidural enhancement around the spinal cord stimulator lead tips. Soft tissue edema and enhancement surrounding the spinal stimulator leads in the superficial soft tissues posterior to the epidural insertion site. No fluid collection. Low T1 and T2 signal artifact in the thoracic paraspinous muscles is unchanged compared to prior study. Disc levels: T1-T2: Negative disc. Mild bilateral facet arthropathy. No stenosis. T2-T3: Small broad-based posterior disc protrusion and mild bilateral facet arthropathy. No stenosis. T3-T4: Small right paracentral disc protrusion. Mild bilateral facet arthropathy. No stenosis. T4-T5: Mild disc bulging.  No stenosis. T5-T6: Small circumferential disc osteophyte complex.  No stenosis. T6-T7: Mild disc bulging.  No stenosis. T7-T8: Mild disc bulging.  No stenosis. T8-T9: Mild disc bulging.  No stenosis. T9-T10: Negative. T10-T11: Mild disc bulging.  No stenosis. T11-T12: Discitis osteomyelitis. Bilateral facet arthropathy. Moderate spinal canal stenosis. Moderate to severe left and severe right neuroforaminal stenosis. IMPRESSION: 1. 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 Ventral epidural  enhancement/phlegmon extending from T10-T11 through L1-L2. Dorsal epidural enhancement/phlegmon from T8-T9 through L1-L2. No discrete epidural or paravertebral abscess. 2. Dorsal epidural enhancement extends to and surrounds the spinal cord stimulator leads. There is also some superficial soft tissue swelling and enhancement surrounding the spinal cord stimulator leads in the posterior midline upper back. Some of this may be residual postsurgical changes given recent placement of the stimulator two and a half months ago, but infected spinal cord stimulator leads should be considered. 3. 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 These results will be called to the ordering clinician or representative by the Radiologist Assistant, and communication documented in the PACS or [employer] Electronically Signed   By: WTitus DubinM.D.   On: 12/05/2022 14:23        Scheduled Meds:  DULoxetine  60 mg Oral QHS   insulin aspart  0-9 Units Subcutaneous Q6H   insulin glargine-yfgn  50 Units Subcutaneous QHS   midazolam       pantoprazole  40 mg Oral Daily   pramipexole  1 mg Oral QHS   pregabalin  75 mg Oral QHS   rosuvastatin  10 mg Oral Daily   torsemide  20 mg Oral Daily   Continuous Infusions:   LOS: 1 day    Time spent: 56 minutes spent on chart review, discussion with nursing staff, consultants, updating family and  interview/physical exam; more than 50% of that time was spent in counseling and/or coordination of care.    Johnnisha Forton J [place] DO [facility] Available via Epic secure chat 7am-7pm After these hours, please refer to coverage provider listed on [email or web address] 12/06/2022, 12:38 PM

## 2022-12-07 DIAGNOSIS — M4644 Discitis, unspecified, thoracic region: Secondary | ICD-10-CM | POA: Diagnosis not present

## 2022-12-07 DIAGNOSIS — T85733A Infection and inflammatory reaction due to implanted electronic neurostimulator of spinal cord, electrode (lead), initial encounter: Secondary | ICD-10-CM

## 2022-12-07 DIAGNOSIS — M8610 Other acute osteomyelitis, unspecified site: Secondary | ICD-10-CM

## 2022-12-07 DIAGNOSIS — N183 Chronic kidney disease, stage 3 unspecified: Secondary | ICD-10-CM | POA: Diagnosis not present

## 2022-12-07 DIAGNOSIS — E1122 Type 2 diabetes mellitus with diabetic chronic kidney disease: Secondary | ICD-10-CM | POA: Diagnosis not present

## 2022-12-07 LAB — CBC
HCT: 25.3 % — ABNORMAL LOW (ref 36.0–46.0)
Hemoglobin: 8.4 g/dL — ABNORMAL LOW (ref 12.0–15.0)
MCH: 31.1 pg (ref 26.0–34.0)
MCHC: 33.2 g/dL (ref 30.0–36.0)
MCV: 93.7 fL (ref 80.0–100.0)
Platelets: 68 10*3/uL — ABNORMAL LOW (ref 150–400)
RBC: 2.7 MIL/uL — ABNORMAL LOW (ref 3.87–5.11)
RDW: 14.9 % (ref 11.5–15.5)
WBC: 4.6 10*3/uL (ref 4.0–10.5)
nRBC: 0 % (ref 0.0–0.2)

## 2022-12-07 LAB — GLUCOSE, CAPILLARY
Glucose-Capillary: 106 mg/dL — ABNORMAL HIGH (ref 70–99)
Glucose-Capillary: 126 mg/dL — ABNORMAL HIGH (ref 70–99)
Glucose-Capillary: 149 mg/dL — ABNORMAL HIGH (ref 70–99)
Glucose-Capillary: 158 mg/dL — ABNORMAL HIGH (ref 70–99)
Glucose-Capillary: 218 mg/dL — ABNORMAL HIGH (ref 70–99)

## 2022-12-07 LAB — BASIC METABOLIC PANEL
Anion gap: 11 (ref 5–15)
BUN: 16 mg/dL (ref 8–23)
CO2: 27 mmol/L (ref 22–32)
Calcium: 8.3 mg/dL — ABNORMAL LOW (ref 8.9–10.3)
Chloride: 94 mmol/L — ABNORMAL LOW (ref 98–111)
Creatinine, Ser: 1.01 mg/dL — ABNORMAL HIGH (ref 0.44–1.00)
GFR, Estimated: >60 mL/min (ref >60)
Glucose, Bld: 175 mg/dL — ABNORMAL HIGH (ref 70–99)
Potassium: 3.9 mmol/L (ref 3.5–5.1)
Sodium: 132 mmol/L — ABNORMAL LOW (ref 135–145)

## 2022-12-07 LAB — HEMOGLOBIN A1C
Hgb A1c MFr Bld: 7.5 % — ABNORMAL HIGH (ref 4.8–5.6)
Mean Plasma Glucose: 169 mg/dL

## 2022-12-07 NOTE — Progress Notes (Signed)
PROGRESS NOTE    Katelyn [name]  [id number] DOB: 06-14-1956 DOA: 12/05/2022 PCP: Algis Greenhouse, MD    Brief Narrative:   Katelyn [name] is a 67 y.o. female with past medical history significant for chronic diastolic congestive heart failure, essential hypertension, hyperlipidemia, anemia of chronic medical disease, type 2 diabetes mellitus, chronic low back pain s/p spinal stimulator (09/2022) who presented to [facility] ED on 2/27 under advisement by her neurosurgeon Dr. Ronnald Ramp for admission due to concerns for acute discitis/osteomyelitis noted on outpatient MRI.    Patient has been following with neurosurgery outpatient for chronic low back pain.  She underwent spine stimulator placement December 2023.  Given patient's persistent back pain repeat MRI T-spine was performed by Dr. Ronnald Ramp which showed evidence of acute discitis/osteomyelitis at the T11/T12 level without evidence of discrete epidural or paravertebral abscess.  Given these findings, Dr. Ronnald Ramp recommended patient present to the emergency department for further evaluation and management.  In the ED, temperature 99.5 F, HR 106, RR 20, BP 165/77, SpO2 96% on room air.  WBC 6.3, hemoglobin 10.4, platelets 128.  Sodium 137, potassium 3.8, chloride 97, CO2 29, glucose 261, BUN 13, creatinine 1.03.  AST 46, ALT 32, total bilirubin 0.6.  Lactic acid 2.7.  Neurosurgery was consulted who recommended medical admission and initiation of antibiotics.  Infectious disease was consulted and recommended holding IV antibiotics until biopsy performed by IR.  TRH consulted for admission for further evaluation management of acute discitis/osteomyelitis in the setting of spinal stimulator lead placement.  Assessment & Plan:    Acute discitis/osteomyelitis Lactic acidosis: resolved Lying to ED by direction of neurosurgery, Dr. Ronnald Ramp for persistent back pain with outpatient MRI findings concerning for acute osteomyelitis/discitis.  Patient is afebrile  without leukocytosis.  Complicated by recent placement of spinal nerve stimulator December 2023.  Neurosurgery was consulted who recommended admission to the medical service with IV antibiotics.  ID was consulted, Dr. Linus Salmons and holding antibiotics until IR aspiration/biopsy.  Patient underwent T11-T12 disc aspiration by IR on 2/28. -- Neurosurgery, infectious disease following, appreciate assistance -- WBC 6.3>7.0 -- ESR 63 -- CRP 1.9 -- Blood cultures x 2: pending -- Disc aspiration culture: No growth less than 12 hours -- Start vancomycin/cefepime, pharmacy consulted for dosing/monitoring -- Anticipate likely need of nerve stimulator leads removed given site of infection, deferring to neurosurgery -- CBC daily  Thrombocytopenia -- Plt 128>142>68; not on heparin/Lovenox, started on cefepime yesterday. -- Continue monitor platelet count daily  Chronic diastolic congestive heart failure, compensated Essential hypertension -- Carvedilol 6.25 mg p.o. twice daily -- Torsemide 20 mg p.o. daily  Hyperlipidemia -- Crestor 10 mg p.o. daily  Type 2 diabetes mellitus Hemoglobin A1c 7.5.  Home regimen includes metformin 500 mg p.o. twice daily, Tresiba 110 units subcutaneously daily, -- Update hemoglobin A1c -- Semglee 50u Walsh qhs -- Sensitive SSI for coverage -- CBGs  qAC/HS  Anemia of chronic medical disease Hemoglobin 9.5, stable.  Chronic low back pain With neurosurgery, Dr. Ronnald Ramp outpatient.  Recent spinal stimulator placement December 2023. -- Likely need spinal stimulator removal as above -- Oxycodone-acetaminophen 5-325 mg p.o. every 4 hours.  Moderate pain -- Dilaudid 0.5 mg IV every 2 hours as needed severe pain  GERD: -- Protonix 40 mg p.o. daily  Morbid obesity Body mass index is 38.02 kg/m.  Patient needs aggressive lifestyle changes/weight loss as this complicates all facets of care.  Outpatient follow-up with PCP.     DVT prophylaxis: SCDs Start: 12/05/22  2022     Code Status: Full Code Family Communication: Updated spouse present at bedside this morning  Disposition Plan:  Level of care: Telemetry Medical Status is: Inpatient Remains inpatient appropriate because: IV antibiotics    Consultants:  Neurosurgery, Dr. Ronnald Ramp Infectious disease  Procedures:  T11-T12 disc aspiration, IR 2/28  Antimicrobials:  Vancomycin 2/28>> Cefepime 2/28>>   Subjective: Patient seen examined at bedside, resting calmly.  Sitting in bedside chair.  Husband present.  Underwent IR disc aspiration yesterday.  Continues on vancomycin/cefepime.  Discussed with ID and neurosurgery this morning.  No questions or concerns at this time.  Denies headache, no dizziness, no chest pain, no fever/chills/night sweats, no nausea/vomiting/diarrhea, no abdominal pain, no cough/congestion, no focal weakness, no fatigue, no paresthesias.  No acute events overnight per nursing staff.  Objective: Vitals:   12/06/22 1256 12/06/22 1723 12/06/22 1925 12/07/22 0809  BP: 139/67 (!) 157/74 (!) 152/59 (!) 139/49  Pulse: 89 92  67  Resp: '16 18 11 17  '$ Temp: 98.6 F (37 C) 98.4 F (36.9 C) 97.9 F (36.6 C) 98.2 F (36.8 C)  TempSrc: Oral Oral Oral Oral  SpO2: 98% 98% 99% 96%  Weight:      Height:        Intake/Output Summary (Last 24 hours) at 12/07/2022 1152 Last data filed at 12/06/2022 2259 Gross per 24 hour  Intake 690.6 ml  Output --  Net 690.6 ml   [employer]   12/05/22 2042  Weight: 94.3 kg    Examination:  Physical Exam: GEN: NAD, alert and oriented x 3, obese HEENT: NCAT, PERRL, EOMI, sclera clear, MMM PULM: CTAB w/o wheezes/crackles, normal respiratory effort, on room air CV: RRR w/o M/G/R GI: abd soft, NTND, NABS, no R/G/M MSK: no peripheral edema, muscle strength globally intact 5/5 bilateral upper/lower extremities NEURO: No focal deficits PSYCH: normal mood/affect    Data Reviewed: I have personally reviewed following labs and imaging  studies  CBC: Recent Labs  Lab 12/05/22 1807 12/06/22 0440 12/07/22 0255  WBC 6.3 7.0 4.6  NEUTROABS 4.8 4.5  --   HGB 10.4* 9.5* 8.4*  HCT 32.5* 29.5* 25.3*  MCV 94.5 95.5 93.7  PLT 128* 142* 68*   Basic Metabolic Panel: Recent Labs  Lab 12/05/22 1807 12/06/22 0710 12/07/22 0255  NA 137 135 132*  K 3.8 3.7 3.9  CL 97* 98 94*  CO2 '29 29 27  '$ GLUCOSE 261* 155* 175*  BUN '13 13 16  '$ CREATININE 1.03* 0.89 1.01*  CALCIUM 9.1 8.7* 8.3*  MG 1.3* 1.6*  --    GFR: Estimated Creatinine Clearance: 59.4 mL/min (A) (by C-G formula based on SCr of 1.01 mg/dL (H)). Liver Function Tests: Recent Labs  Lab 12/05/22 1807 12/06/22 0710  AST 46* 34  ALT 32 25  ALKPHOS 179* 153*  BILITOT 0.6 0.6  PROT 7.2 6.6  ALBUMIN 3.1* 2.8*   No results for input(s): "LIPASE", "AMYLASE" in the last 168 hours. No results for input(s): "AMMONIA" in the last 168 hours. Coagulation Profile: Recent Labs  Lab 12/05/22 1807  INR 1.0   Cardiac Enzymes: No results for input(s): "CKTOTAL", "CKMB", "CKMBINDEX", "TROPONINI" in the last 168 hours. BNP (last 3 results) No results for input(s): "PROBNP" in the last 8760 hours. HbA1C: Recent Labs    12/05/22 1807  HGBA1C 7.5*   CBG: Recent Labs  Lab 12/06/22 1321 12/06/22 1646 12/07/22 0006 12/07/22 0621 12/07/22 0811  GLUCAP 119* 126* 218* 106* 126*   Lipid Profile: No  results for input(s): "CHOL", "HDL", "LDLCALC", "TRIG", "CHOLHDL", "LDLDIRECT" in the last 72 hours. Thyroid Function Tests: No results for input(s): "TSH", "T4TOTAL", "FREET4", "T3FREE", "THYROIDAB" in the last 72 hours. Anemia Panel: No results for input(s): "VITAMINB12", "FOLATE", "FERRITIN", "TIBC", "IRON", "RETICCTPCT" in the last 72 hours. Sepsis Labs: Recent Labs  Lab 12/05/22 1800 12/05/22 2110  LATICACIDVEN 2.7* 1.8    Recent Results (from the past 240 hour(s))  Aerobic/Anaerobic Culture w Gram Stain (surgical/deep wound)     Status: None (Preliminary  result)   Collection Time: 12/06/22  1:00 PM   Specimen: PATH Disc; Body Fluid  Result Value Ref Range Status   Specimen Description FLUID  Final   Special Requests DISC FLUID  Final   Gram Stain PENDING  Incomplete   Culture   Final    NO GROWTH < 12 HOURS Performed at [facility], [address] [place], [place] [address]    Report Status PENDING  Incomplete         Radiology Studies: MR THORACIC SPINE W WO CONTRAST  Result Date: 12/05/2022 CLINICAL DATA:  Acute osteomyelitis. EXAM: MRI THORACIC WITHOUT AND WITH CONTRAST TECHNIQUE: Multiplanar and multiecho pulse sequences of the thoracic spine were obtained without and with intravenous contrast. CONTRAST:  75m GADAVIST GADOBUTROL 1 MMOL/ML IV SOLN COMPARISON:  CT abdomen pelvis dated November 30, 2022. MRI thoracic spine dated June 16, 2021. MRI lumbar spine dated November 09, 2020. FINDINGS: Alignment:  Trace retrolisthesis at T11-T12. Vertebrae: Prominent abnormal fluid and enhancement of the T11-T12 disc space with adjacent endplate erosion and diffuse marrow edema and enhancement involving the T11 and T12 vertebral bodies, consistent with osteomyelitis discitis. No acute fracture or suspicious bone lesion. Partially visualized lumbar fusion hardware. Cord: Normal signal and morphology. Dorsal epidural enhancement from T8-T9 through T12-L1. Ventral epidural enhancement from T10-T11 through L1-L2. No discrete epidural fluid collection. No abnormal intrathecal enhancement. Paraspinal and other soft tissues: Paravertebral inflammatory changes at T11-T12. No fluid collection. Spinal cord stimulator entering the dorsal epidural space at T8-T9 with the lead tips at T6. Epidural enhancement around the spinal cord stimulator lead tips. Soft tissue edema and enhancement surrounding the spinal stimulator leads in the superficial soft tissues posterior to the epidural insertion site. No fluid collection. Low T1 and T2 signal artifact in  the thoracic paraspinous muscles is unchanged compared to prior study. Disc levels: T1-T2: Negative disc. Mild bilateral facet arthropathy. No stenosis. T2-T3: Small broad-based posterior disc protrusion and mild bilateral facet arthropathy. No stenosis. T3-T4: Small right paracentral disc protrusion. Mild bilateral facet arthropathy. No stenosis. T4-T5: Mild disc bulging.  No stenosis. T5-T6: Small circumferential disc osteophyte complex.  No stenosis. T6-T7: Mild disc bulging.  No stenosis. T7-T8: Mild disc bulging.  No stenosis. T8-T9: Mild disc bulging.  No stenosis. T9-T10: Negative. T10-T11: Mild disc bulging.  No stenosis. T11-T12: Discitis osteomyelitis. Bilateral facet arthropathy. Moderate spinal canal stenosis. Moderate to severe left and severe right neuroforaminal stenosis. IMPRESSION: 1. 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 Ventral epidural enhancement/phlegmon extending from T10-T11 through L1-L2. Dorsal epidural enhancement/phlegmon from T8-T9 through L1-L2. No discrete epidural or paravertebral abscess. 2. Dorsal epidural enhancement extends to and surrounds the spinal cord stimulator leads. There is also some superficial soft tissue swelling and enhancement surrounding the spinal cord stimulator leads in the posterior midline upper back. Some of this may be residual postsurgical changes given recent placement of the stimulator two and a half months ago, but infected spinal cord stimulator leads should be considered. 3.  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 These results will be called to the ordering clinician or representative by the Radiologist Assistant, and communication documented in the PACS or [employer]. Electronically Signed   By: Titus Dubin M.D.   On: 12/05/2022 14:23        Scheduled Meds:  carvedilol  6.25 mg Oral BID   DULoxetine  60 mg Oral QHS   insulin aspart  0-9 Units Subcutaneous Q6H   insulin  glargine-yfgn  50 Units Subcutaneous QHS   pantoprazole  40 mg Oral Daily   pramipexole  1 mg Oral QHS   pregabalin  75 mg Oral QHS   rosuvastatin  10 mg Oral Daily   torsemide  20 mg Oral Daily   Continuous Infusions:  ceFEPime (MAXIPIME) IV 2 g (12/07/22 0538)   vancomycin       LOS: 2 days    Time spent: 48 minutes spent on chart review, discussion with nursing staff, consultants, updating family and interview/physical exam; more than 50% of that time was spent in counseling and/or coordination of care.    Dorthey Depace J [place] DO [facility] Available via Epic secure chat 7am-7pm After these hours, please refer to coverage provider listed on [email or web address] 12/07/2022, 11:52 AM

## 2022-12-07 NOTE — Consult Note (Signed)
[facility] for Infectious Disease    Date of Admission:  12/05/2022     Reason for Consult: Spinal infection     Referring Physician: Dr [place]  Current antibiotics: Vancomycin Cefepime  ASSESSMENT:    67 y.o. female admitted with:  T11-T12 discitis-osteomyelitis with epidural phlegmon: This is in the setting of recent spinal stimulator placement with MRI showing soft tissue swelling and enhancement surrounding the spinal cord stimulator leads in the posterior midline upper back.  Status post IR aspiration 12/06/2022 with cultures that are currently pending. History of lumbar spine surgery: Status post lumbar instrumented fusion in November 0000000 complicated by wound dehiscence requiring I&D and January 2018 with cultures at that time which grew Serratia marcescens. Thrombocytopenia: Platelets dropped overnight from 142 to 68.  Unclear etiology dilutional. Type 2 diabetes: Hemoglobin A1c 7.5 previously updated  RECOMMENDATIONS:    Continue vancomycin and cefepime Follow blood and aspirate cultures Await NSGY evaluation to determine if intervention needed, particularly in regard to recently placed stimulator which may need to be removed in the setting of her new infection Following   Principal Problem:   Diskitis Active Problems:   Hyperlipidemia   Chronic diastolic CHF (congestive heart failure) (HCC)   DM2 (diabetes mellitus, type 2) (HCC)   Restless leg syndrome   Essential hypertension   GAD (generalized anxiety disorder)   Lactic acidosis   Hypomagnesemia   Anemia of chronic disease   MEDICATIONS:    Scheduled Meds:  carvedilol  6.25 mg Oral BID   DULoxetine  60 mg Oral QHS   insulin aspart  0-9 Units Subcutaneous Q6H   insulin glargine-yfgn  50 Units Subcutaneous QHS   pantoprazole  40 mg Oral Daily   pramipexole  1 mg Oral QHS   pregabalin  75 mg Oral QHS   rosuvastatin  10 mg Oral Daily   torsemide  20 mg Oral Daily   Continuous Infusions:   ceFEPime (MAXIPIME) IV 2 g (12/07/22 0538)   vancomycin 1,000 mg (12/07/22 1221)   PRN Meds:.acetaminophen **OR** acetaminophen, HYDROmorphone (DILAUDID) injection, melatonin, naLOXone (NARCAN)  injection, ondansetron (ZOFRAN) IV, oxyCODONE-acetaminophen  HPI:    Katelyn [name] is a 67 y.o. female with past medical history as noted below including chronic back pain status post multiple spinal surgeries with hardware placement.  Most recently, she had a spinal stimulator placed in December 2023 with worsening back pain over the last 2 to 3 weeks.  She had an outpatient MRI which showed concern for infection in her spine and was subsequently advised to go to the emergency department by her neurosurgery team.  In the emergency department she was found to have a temperature of 99.5, mild tachycardia, and hypertensive.  WBC was 6.3.  She was admitted to the hospital service and underwent IR guided aspiration yesterday.  Following this she was placed on vancomycin and cefepime.   Past Medical History:  Diagnosis Date   Anxiety    Arthritis    Bursitis of right hip    CAD (coronary artery disease)    Cardiac catheterization June 2014 in [place] - 50% circumflex stenosis   Chest pain, neg MI, stable CAD non obstructive on cath 10/05/20 10/04/2020   Chronic diastolic heart failure ([place]) 08/20/2017   Chronic kidney disease, stage 3 ([place])    does not see nephrologist   Chronic low back pain without sciatica 03/14/2016   CKD (chronic kidney disease), stage III ([place]) [id number]   Complication of anesthesia  Cough 04/19/2017   Overview:  Last Assessment & Plan:  Formatting of this note may be different from the original. Cough - ? ACE related with AR triggers   Plan  Patient Instructions  Discuss with your primary doctor that lisinopril pain, need making your cough worse. May use Mucinex DM twice daily as needed for cough and congestion Zyrtec 10 mg at bedtime as needed for drainage Saline nasal spray as  needed. Lab tests today Activity as tolerated. Follow with Dr. [name] in 3-4 months and As needed   Please contact office for sooner follow up if symptoms do not improve or worsen or seek emergency care    Depression    Dyspnea    with exertion   " lazy lung" - per  [name] from back issues- 06/2016   Elevated liver enzymes 12/05/2016   Essential hypertension    GERD (gastroesophageal reflux disease)    Gout 03/14/2016   Greater trochanteric bursitis of right hip 02/02/2012   H/O hiatal hernia    Heart murmur    History of blood transfusion 2016   History of esophageal stricture 10/07/2020   History of kidney stones    Hypercholesterolemia    Hypertensive heart disease with heart failure ([place]) 01/01/2017   Hypoxia 10/07/2020   Iliotibial band syndrome of right side 02/02/2012   Iron deficiency anemia due to chronic blood loss 03/14/2016   LBBB (left bundle branch block) 01/01/2017   Left bundle branch block    Leg weakness 10/07/2020   Lumbar stenosis    Meralgia paraesthetica 12/05/2016   Mild CAD 11/24/2015   Morbid obesity ([place]) 10/07/2020   Neuropathy    OSA (obstructive sleep apnea) 05/24/2016   Overview:  Managed PULM   PONV (postoperative nausea and vomiting)    "no N/V with patch"   Restless leg syndrome    S/P lumbar laminectomy 11/26/2015   S/P lumbar spinal fusion 08/29/2016   Tinnitus 12/05/2016   Type 2 diabetes mellitus ([place])    UTI (urinary tract infection) 10/07/2020    Social History   Tobacco Use   Smoking status: Never   Smokeless tobacco: Never   Tobacco comments:    Prior secondhand smoke  Vaping Use   Vaping Use: Never used  Substance Use Topics   Alcohol use: Yes    Alcohol/week: 0.0 standard drinks of alcohol    Comment: Rarely   Drug use: No    Family History  Problem Relation Age of Onset   Lung cancer Father        smoked   Hypertension Father    Stroke Father    Heart failure Mother    Bone cancer Sister    Asthma Sister     Allergies   Allergen Reactions   Atorvastatin Nausea And Vomiting and Other (See Comments)    MYALGIAS   Talwin [Pentazocine] Other (See Comments)    headache   Other Nausea Only    UNSPECIFIED Anesthesia    Review of Systems  All other systems reviewed and are negative.  Except as noted in the HPI  OBJECTIVE:   Blood pressure (!) 139/49, pulse 67, temperature 98.2 F (36.8 C), temperature source Oral, resp. rate 17, height '5\' 2"'$  (1.575 m), weight 94.3 kg, SpO2 96 %. Body mass index is 38.02 kg/m.  Physical Exam Constitutional:      Comments: Very pleasant, elderly woman, sitting up in the chair and eating breakfast with her husband at the bedside.   HENT:  Head: Normocephalic and atraumatic.  Eyes:     Extraocular Movements: Extraocular movements intact.     Conjunctiva/sclera: Conjunctivae normal.  Pulmonary:     Effort: Pulmonary effort is normal. No respiratory distress.  Abdominal:     General: There is no distension.     Palpations: Abdomen is soft.  Musculoskeletal:        General: Normal range of motion.     Cervical back: Normal range of motion and neck supple.  Skin:    General: Skin is warm and dry.  Neurological:     General: No focal deficit present.     Mental Status: She is oriented to person, place, and time.  Psychiatric:        Mood and Affect: Mood normal.        Behavior: Behavior normal.      Lab Results: Lab Results  Component Value Date   WBC 4.6 12/07/2022   HGB 8.4 (L) 12/07/2022   HCT 25.3 (L) 12/07/2022   MCV 93.7 12/07/2022   PLT 68 (L) 12/07/2022    Lab Results  Component Value Date   NA 132 (L) 12/07/2022   K 3.9 12/07/2022   CO2 27 12/07/2022   GLUCOSE 175 (H) 12/07/2022   BUN 16 12/07/2022   CREATININE 1.01 (H) 12/07/2022   CALCIUM 8.3 (L) 12/07/2022   GFRNONAA >60 12/07/2022   GFRAA 62 10/22/2020    Lab Results  Component Value Date   ALT 25 12/06/2022   AST 34 12/06/2022   ALKPHOS 153 (H) 12/06/2022   BILITOT 0.6  12/06/2022       Component Value Date/Time   CRP 1.9 (H) 12/06/2022 0710       Component Value Date/Time   ESRSEDRATE 63 (H) 12/05/2022 1807    I have reviewed the micro and lab results in Epic.  Imaging: MR THORACIC SPINE W WO CONTRAST  Result Date: 12/05/2022 CLINICAL DATA:  Acute osteomyelitis. EXAM: MRI THORACIC WITHOUT AND WITH CONTRAST TECHNIQUE: Multiplanar and multiecho pulse sequences of the thoracic spine were obtained without and with intravenous contrast. CONTRAST:  17m GADAVIST GADOBUTROL 1 MMOL/ML IV SOLN COMPARISON:  CT abdomen pelvis dated November 30, 2022. MRI thoracic spine dated June 16, 2021. MRI lumbar spine dated November 09, 2020. FINDINGS: Alignment:  Trace retrolisthesis at T11-T12. Vertebrae: Prominent abnormal fluid and enhancement of the T11-T12 disc space with adjacent endplate erosion and diffuse marrow edema and enhancement involving the T11 and T12 vertebral bodies, consistent with osteomyelitis discitis. No acute fracture or suspicious bone lesion. Partially visualized lumbar fusion hardware. Cord: Normal signal and morphology. Dorsal epidural enhancement from T8-T9 through T12-L1. Ventral epidural enhancement from T10-T11 through L1-L2. No discrete epidural fluid collection. No abnormal intrathecal enhancement. Paraspinal and other soft tissues: Paravertebral inflammatory changes at T11-T12. No fluid collection. Spinal cord stimulator entering the dorsal epidural space at T8-T9 with the lead tips at T6. Epidural enhancement around the spinal cord stimulator lead tips. Soft tissue edema and enhancement surrounding the spinal stimulator leads in the superficial soft tissues posterior to the epidural insertion site. No fluid collection. Low T1 and T2 signal artifact in the thoracic paraspinous muscles is unchanged compared to prior study. Disc levels: T1-T2: Negative disc. Mild bilateral facet arthropathy. No stenosis. T2-T3: Small broad-based posterior disc  protrusion and mild bilateral facet arthropathy. No stenosis. T3-T4: Small right paracentral disc protrusion. Mild bilateral facet arthropathy. No stenosis. T4-T5: Mild disc bulging.  No stenosis. T5-T6: Small circumferential disc osteophyte complex.  No  stenosis. T6-T7: Mild disc bulging.  No stenosis. T7-T8: Mild disc bulging.  No stenosis. T8-T9: Mild disc bulging.  No stenosis. T9-T10: Negative. T10-T11: Mild disc bulging.  No stenosis. T11-T12: Discitis osteomyelitis. Bilateral facet arthropathy. Moderate spinal canal stenosis. Moderate to severe left and severe right neuroforaminal stenosis. IMPRESSION: 1. 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 Ventral epidural enhancement/phlegmon extending from T10-T11 through L1-L2. Dorsal epidural enhancement/phlegmon from T8-T9 through L1-L2. No discrete epidural or paravertebral abscess. 2. Dorsal epidural enhancement extends to and surrounds the spinal cord stimulator leads. There is also some superficial soft tissue swelling and enhancement surrounding the spinal cord stimulator leads in the posterior midline upper back. Some of this may be residual postsurgical changes given recent placement of the stimulator two and a half months ago, but infected spinal cord stimulator leads should be considered. 3. 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 These results will be called to the ordering clinician or representative by the Radiologist Assistant, and communication documented in the PACS or [employer]. Electronically Signed   By: Titus Dubin M.D.   On: 12/05/2022 14:23     Imaging independently reviewed in Epic.  [facility] [phone] pager 12/07/2022, 12:24 PM

## 2022-12-07 NOTE — Progress Notes (Signed)
Patient ID: Katelyn [name], female   DOB: 03/20/1956, 67 y.o.   MRN: [id number]  Patient is status post spinal cord stimulator placement in December.  She has had some right flank pain.  Workup to rule out kidney stone showed possible diskitis at T11-12.    She states she was told she had a bladder infection.  This was treated with antibiotics.   Sed rate CRP will not overly elevated. MRI with contrast was done.  I have reviewed the images and I have reviewed the report.  I agree with the report.    She is completely nontender over the battery site and over the incision site for the thoracic laminectomy.  The spinal cord stimulator works beautifully for her and improves her chronic pain syndrome.  In no way does she want to have it removed.  The incisions are well healed.  The battery site is in the left flank and her pain has been in the right flank.  She denies leg pain or numbness or tingling or weakness or change in gait.    While I cannot completely rule out infection around the leads, the lack of tenderness and the fact that it is working perfectly makes me very hesitant to remove the expensive and functional and effective spinal cord stimulator.  She does not want to have it removed.  I am not sure there is a good way to rule out infection around the leads without exploring it, but I do not think this is overly reasonable.    I would continue to treat with IV antibiotics for at least 6 weeks followed potentially by oral antibiotics.  If there is infection around the leads hopefully this will clear that infection though I understand that could be difficult.  However, given the fact that we cannot be convinced that there is infection around the leads and it is working so well and removal would mean she would never be able to have another 1 placed because of scar tissue,   I am very hesitant to recommend removal of the system.

## 2022-12-08 DIAGNOSIS — M4644 Discitis, unspecified, thoracic region: Secondary | ICD-10-CM | POA: Diagnosis not present

## 2022-12-08 DIAGNOSIS — E1122 Type 2 diabetes mellitus with diabetic chronic kidney disease: Secondary | ICD-10-CM | POA: Diagnosis not present

## 2022-12-08 DIAGNOSIS — T85733D Infection and inflammatory reaction due to implanted electronic neurostimulator of spinal cord, electrode (lead), subsequent encounter: Secondary | ICD-10-CM

## 2022-12-08 DIAGNOSIS — N183 Chronic kidney disease, stage 3 unspecified: Secondary | ICD-10-CM | POA: Diagnosis not present

## 2022-12-08 DIAGNOSIS — M8618 Other acute osteomyelitis, other site: Secondary | ICD-10-CM | POA: Diagnosis not present

## 2022-12-08 LAB — BASIC METABOLIC PANEL
Anion gap: 9 (ref 5–15)
BUN: 19 mg/dL (ref 8–23)
CO2: 29 mmol/L (ref 22–32)
Calcium: 8.8 mg/dL — ABNORMAL LOW (ref 8.9–10.3)
Chloride: 97 mmol/L — ABNORMAL LOW (ref 98–111)
Creatinine, Ser: 1.01 mg/dL — ABNORMAL HIGH (ref 0.44–1.00)
GFR, Estimated: >60 mL/min (ref >60)
Glucose, Bld: 145 mg/dL — ABNORMAL HIGH (ref 70–99)
Potassium: 4.1 mmol/L (ref 3.5–5.1)
Sodium: 135 mmol/L (ref 135–145)

## 2022-12-08 LAB — CBC
HCT: 30.9 % — ABNORMAL LOW (ref 36.0–46.0)
Hemoglobin: 10.3 g/dL — ABNORMAL LOW (ref 12.0–15.0)
MCH: 30.9 pg (ref 26.0–34.0)
MCHC: 33.3 g/dL (ref 30.0–36.0)
MCV: 92.8 fL (ref 80.0–100.0)
Platelets: 122 10*3/uL — ABNORMAL LOW (ref 150–400)
RBC: 3.33 MIL/uL — ABNORMAL LOW (ref 3.87–5.11)
RDW: 14.7 % (ref 11.5–15.5)
WBC: 5.9 10*3/uL (ref 4.0–10.5)
nRBC: 0 % (ref 0.0–0.2)

## 2022-12-08 LAB — GLUCOSE, CAPILLARY
Glucose-Capillary: 121 mg/dL — ABNORMAL HIGH (ref 70–99)
Glucose-Capillary: 140 mg/dL — ABNORMAL HIGH (ref 70–99)
Glucose-Capillary: 146 mg/dL — ABNORMAL HIGH (ref 70–99)
Glucose-Capillary: 188 mg/dL — ABNORMAL HIGH (ref 70–99)
Glucose-Capillary: 194 mg/dL — ABNORMAL HIGH (ref 70–99)

## 2022-12-08 LAB — HEMOGLOBIN A1C
Hgb A1c MFr Bld: 7.2 % — ABNORMAL HIGH (ref 4.8–5.6)
Mean Plasma Glucose: 160 mg/dL

## 2022-12-08 LAB — SEDIMENTATION RATE: Sed Rate: 64 mm/hr — ABNORMAL HIGH (ref 0–22)

## 2022-12-08 LAB — MAGNESIUM: Magnesium: 1.6 mg/dL — ABNORMAL LOW (ref 1.7–2.4)

## 2022-12-08 LAB — SURGICAL PATHOLOGY

## 2022-12-08 LAB — C-REACTIVE PROTEIN: CRP: 3.2 mg/dL — ABNORMAL HIGH (ref <1.0)

## 2022-12-08 NOTE — Progress Notes (Signed)
PROGRESS NOTE    Katelyn [name]  [id number] DOB: [birth year] DOA: 12/05/2022 PCP: Algis Greenhouse, MD    Brief Narrative:   Katelyn [name] is a 67 y.o. female with past medical history significant for chronic diastolic congestive heart failure, essential hypertension, hyperlipidemia, anemia of chronic medical disease, type 2 diabetes mellitus, chronic low back pain s/p spinal stimulator (09/2022) who presented to [facility] ED on 2/27 under advisement by her neurosurgeon Dr. Ronnald Ramp for admission due to concerns for acute discitis/osteomyelitis noted on outpatient MRI.    Patient has been following with neurosurgery outpatient for chronic low back pain.  She underwent spine stimulator placement December 2023.  Given patient's persistent back pain repeat MRI T-spine was performed by Dr. Ronnald Ramp which showed evidence of acute discitis/osteomyelitis at the T11/T12 level without evidence of discrete epidural or paravertebral abscess.  Given these findings, Dr. Ronnald Ramp recommended patient present to the emergency department for further evaluation and management.  In the ED, temperature 99.5 F, HR 106, RR 20, BP 165/77, SpO2 96% on room air.  WBC 6.3, hemoglobin 10.4, platelets 128.  Sodium 137, potassium 3.8, chloride 97, CO2 29, glucose 261, BUN 13, creatinine 1.03.  AST 46, ALT 32, total bilirubin 0.6.  Lactic acid 2.7.  Neurosurgery was consulted who recommended medical admission and initiation of antibiotics.  Infectious disease was consulted and recommended holding IV antibiotics until biopsy performed by IR.  TRH consulted for admission for further evaluation management of acute discitis/osteomyelitis in the setting of spinal stimulator lead placement.  Assessment & Plan:    Acute discitis/osteomyelitis Lactic acidosis: resolved Lying to ED by direction of neurosurgery, Dr. Ronnald Ramp for persistent back pain with outpatient MRI findings concerning for acute osteomyelitis/discitis.  Patient is afebrile  without leukocytosis.  Complicated by recent placement of spinal nerve stimulator December 2023.  Neurosurgery was consulted who recommended admission to the medical service with IV antibiotics.  ID was consulted, Dr. Linus Salmons recommended holding antibiotics until IR aspiration/biopsy.  Patient underwent T11-T12 disc aspiration by IR on 2/28.  Seen by neurosurgery, Dr. Ronnald Ramp; who is currently recommending not to remove spinal stimulator at this time. -- Neurosurgery, infectious disease following, appreciate assistance -- WBC 6.3>7.0>5.9 -- ESR 63>64 -- CRP 1.9>3.2 -- Blood cultures x 2: No growth x 3 days -- Disc aspiration culture: No organisms noted on Gram stain, no growth < 12h -- vancomycin/cefepime, pharmacy consulted for dosing/monitoring -- CBC daily  Thrombocytopenia -- Plt 128>142>68>122; not on heparin/Lovenox, started on cefepime yesterday. -- Continue monitor platelet count daily  Chronic diastolic congestive heart failure, compensated Essential hypertension -- Carvedilol 6.25 mg p.o. twice daily -- Torsemide 20 mg p.o. daily  Hyperlipidemia -- Crestor 10 mg p.o. daily  Type 2 diabetes mellitus Hemoglobin A1c 7.2.  Home regimen includes metformin 500 mg p.o. twice daily, Tresiba 110 units subcutaneously daily, -- Semglee 50u [place] qhs -- Sensitive SSI for coverage -- CBGs  qAC/HS  Anemia of chronic medical disease Hemoglobin 9.5, stable.  Chronic low back pain With neurosurgery, Dr. Ronnald Ramp outpatient.  Recent spinal stimulator placement December 2023. -- Oxycodone-acetaminophen 5-325 mg p.o. every 4 hours.  Moderate pain -- Dilaudid 0.5 mg IV every 2 hours as needed severe pain  GERD: -- Protonix 40 mg p.o. daily  Morbid obesity Body mass index is 38.02 kg/m.  Patient needs aggressive lifestyle changes/weight loss as this complicates all facets of care.  Outpatient follow-up with PCP.     DVT prophylaxis: SCDs Start: 12/05/22 2022  Code Status: Full Code Family  Communication: No family present at bedside this morning  Disposition Plan:  Level of care: Med-Surg Status is: Inpatient Remains inpatient appropriate because: IV antibiotics    Consultants:  Neurosurgery, Dr. Ronnald Ramp Infectious disease  Procedures:  T11-T12 disc aspiration, IR 2/28  Antimicrobials:  Vancomycin 2/28>> Cefepime 2/28>>   Subjective: Patient seen examined at bedside, resting calmly.  Sitting in bedside chair.    Continues on vancomycin/cefepime.  No questions or concerns at this time.  Seen by neurosurgery yesterday, no plans for removal of spinal stimulator at this time; and recommended 6 weeks IV antibiotics followed by oral thereafter.  Denies headache, no dizziness, no chest pain, no fever/chills/night sweats, no nausea/vomiting/diarrhea, no abdominal pain, no cough/congestion, no focal weakness, no fatigue, no paresthesias.  No acute events overnight per nursing staff.  Objective: Vitals:   12/07/22 0809 12/07/22 1542 12/07/22 2017 12/08/22 0732  BP: (!) 139/49 (!) 118/41 136/61 (!) 160/71  Pulse: 67 68 78 76  Resp: '17 17 18 17  '$ Temp: 98.2 F (36.8 C) 97.8 F (36.6 C) 98.3 F (36.8 C) 98.6 F (37 C)  TempSrc: Oral Oral Oral Oral  SpO2: 96% 97% 96% 100%  Weight:      Height:        Intake/Output Summary (Last 24 hours) at 12/08/2022 1037 Last data filed at 12/07/2022 2208 Gross per 24 hour  Intake --  Output 400 ml  Net -400 ml   Filed Weights   12/05/22 2042  Weight: 94.3 kg    Examination:  Physical Exam: GEN: NAD, alert and oriented x 3, obese HEENT: NCAT, PERRL, EOMI, sclera clear, MMM PULM: CTAB w/o wheezes/crackles, normal respiratory effort, on room air CV: RRR w/o M/G/R GI: abd soft, NTND, NABS, no R/G/M MSK: no peripheral edema, muscle strength globally intact 5/5 bilateral upper/lower extremities NEURO: No focal deficits PSYCH: normal mood/affect       Data Reviewed: I have personally reviewed following labs and imaging  studies  CBC: Recent Labs  Lab 12/05/22 1807 12/06/22 0440 12/07/22 0255 12/08/22 0625  WBC 6.3 7.0 4.6 5.9  NEUTROABS 4.8 4.5  --   --   HGB 10.4* 9.5* 8.4* 10.3*  HCT 32.5* 29.5* 25.3* 30.9*  MCV 94.5 95.5 93.7 92.8  PLT 128* 142* 68* [id number]*   Basic Metabolic Panel: Recent Labs  Lab 12/05/22 1807 12/06/22 0710 12/07/22 0255 12/08/22 0625  NA 137 135 132* 135  K 3.8 3.7 3.9 4.1  CL 97* 98 94* 97*  CO2 '29 29 27 29  '$ GLUCOSE 261* 155* 175* 145*  BUN '13 13 16 19  '$ CREATININE 1.03* 0.89 1.01* 1.01*  CALCIUM 9.1 8.7* 8.3* 8.8*  MG 1.3* 1.6*  --  1.6*   GFR: Estimated Creatinine Clearance: 59.4 mL/min (A) (by C-G formula based on SCr of 1.01 mg/dL (H)). Liver Function Tests: Recent Labs  Lab 12/05/22 1807 12/06/22 0710  AST 46* 34  ALT 32 25  ALKPHOS 179* 153*  BILITOT 0.6 0.6  PROT 7.2 6.6  ALBUMIN 3.1* 2.8*   No results for input(s): "LIPASE", "AMYLASE" in the last 168 hours. No results for input(s): "AMMONIA" in the last 168 hours. Coagulation Profile: Recent Labs  Lab 12/05/22 1807  INR 1.0   Cardiac Enzymes: No results for input(s): "CKTOTAL", "CKMB", "CKMBINDEX", "TROPONINI" in the last 168 hours. BNP (last 3 results) No results for input(s): "PROBNP" in the last 8760 hours. HbA1C: Recent Labs    12/05/22 1807 12/07/22 0255  HGBA1C 7.5* 7.2*   CBG: Recent Labs  Lab 12/07/22 0811 12/07/22 1159 12/07/22 1803 12/08/22 0002 12/08/22 0625  GLUCAP 126* 158* 149* 121* 146*   Lipid Profile: No results for input(s): "CHOL", "HDL", "LDLCALC", "TRIG", "CHOLHDL", "LDLDIRECT" in the last 72 hours. Thyroid Function Tests: No results for input(s): "TSH", "T4TOTAL", "FREET4", "T3FREE", "THYROIDAB" in the last 72 hours. Anemia Panel: No results for input(s): "VITAMINB12", "FOLATE", "FERRITIN", "TIBC", "IRON", "RETICCTPCT" in the last 72 hours. Sepsis Labs: Recent Labs  Lab 12/05/22 1800 12/05/22 2110  LATICACIDVEN 2.7* 1.8    Recent Results (from  the past 240 hour(s))  Culture, blood (Routine x 2)     Status: None (Preliminary result)   Collection Time: 12/05/22  6:00 PM   Specimen: BLOOD RIGHT ARM  Result Value Ref Range Status   Specimen Description BLOOD RIGHT ARM  Final   Special Requests   Final    AEROBIC BOTTLE ONLY Blood Culture results may not be optimal due to an inadequate volume of blood received in culture bottles   Culture   Final    NO GROWTH 3 DAYS Performed at [facility], [place] [address], [place], [place] [address]    Report Status PENDING  Incomplete  Culture, blood (Routine x 2)     Status: None (Preliminary result)   Collection Time: 12/05/22  6:55 PM   Specimen: BLOOD RIGHT HAND  Result Value Ref Range Status   Specimen Description BLOOD RIGHT HAND  Final   Special Requests   Final    BOTTLES DRAWN AEROBIC AND ANAEROBIC Blood Culture adequate volume   Culture   Final    NO GROWTH 3 DAYS Performed at [facility], [place] [address], [place], [place] [address]    Report Status PENDING  Incomplete  Aerobic/Anaerobic Culture w Gram Stain (surgical/deep wound)     Status: None (Preliminary result)   Collection Time: 12/06/22  1:00 PM   Specimen: PATH Disc; Body Fluid  Result Value Ref Range Status   Specimen Description FLUID  Final   Special Requests DISC FLUID  Final   Gram Stain NO WBC SEEN NO ORGANISMS SEEN   Final   Culture   Final    NO GROWTH < 12 HOURS Performed at [facility], [address], [place], [place] [address]    Report Status PENDING  Incomplete         Radiology Studies: No results found.      Scheduled Meds:  carvedilol  6.25 mg Oral BID   DULoxetine  60 mg Oral QHS   insulin aspart  0-9 Units Subcutaneous Q6H   insulin glargine-yfgn  50 Units Subcutaneous QHS   pantoprazole  40 mg Oral Daily   pramipexole  1 mg Oral QHS   pregabalin  75 mg Oral QHS   rosuvastatin  10 mg Oral Daily   torsemide  20 mg Oral Daily   Continuous Infusions:   ceFEPime (MAXIPIME) IV 2 g (12/08/22 [id number])   vancomycin 1,000 mg (12/07/22 1221)     LOS: 3 days    Time spent: 48 minutes spent on chart review, discussion with nursing staff, consultants, updating family and interview/physical exam; more than 50% of that time was spent in counseling and/or coordination of care.    Javaria Knapke J [place] DO [facility] Available via Epic secure chat 7am-7pm After these hours, please refer to coverage provider listed on [email or web address] 12/08/2022, 10:37 AM

## 2022-12-08 NOTE — Progress Notes (Signed)
[facility]  Date of Admission:  12/05/2022           Reason for visit: Follow up on Spinal infection                                      Current antibiotics: Vancomycin Cefepime  ASSESSMENT:    67 y.o. female admitted with:  T11-T12 discitis-osteomyelitis with epidural phlegmon from T8-T9 through L1-L2: This is in the setting of recent spinal stimulator placement with MRI showing soft tissue swelling and enhancement surrounding the spinal cord stimulator leads in the posterior midline upper back.  Status post IR aspiration 12/06/2022 with cultures that are currently no growth to date.  Blood cultures also no growth to date.  Seen by NSGY yesterday and current plan is for medical management with IV antibiotics given hesitancy to remove (and not be able to replace) spinal stimulator. History of lumbar spine surgery: Status post lumbar instrumented fusion in November 0000000 complicated by wound dehiscence requiring I&D and January 2018 with cultures at that time which grew Serratia marcescens. Type 2 diabetes: Hemoglobin A1c 7.2.  RECOMMENDATIONS:    Continue current antibiotics with cefepime and vancomycin Follow up cultures Noted NSGY plans regarding her stimulator and discussed with patient as well.  Will plan for antibiotic therapy IV and then possible suppression given the uncertainty regarding involvement of her stimulator that at present will remain in place Dr Linus Salmons will check cultures over the weekend and I will return Monday If her blood cx are negative on Sunday, would be okay to place PICC line   Principal Problem:   Diskitis Active Problems:   Hyperlipidemia   Chronic diastolic CHF (congestive heart failure) (HCC)   DM2 (diabetes mellitus, type 2) (HCC)   Restless leg syndrome   Essential hypertension   GAD (generalized anxiety disorder)   Lactic acidosis   Hypomagnesemia   Anemia of chronic disease   Infection of spinal cord  stimulator ([place])    MEDICATIONS:    Scheduled Meds:  carvedilol  6.25 mg Oral BID   DULoxetine  60 mg Oral QHS   insulin aspart  0-9 Units Subcutaneous Q6H   insulin glargine-yfgn  50 Units Subcutaneous QHS   pantoprazole  40 mg Oral Daily   pramipexole  1 mg Oral QHS   pregabalin  75 mg Oral QHS   rosuvastatin  10 mg Oral Daily   torsemide  20 mg Oral Daily   Continuous Infusions:  ceFEPime (MAXIPIME) IV 2 g (12/08/22 [id number])   vancomycin 1,000 mg (12/07/22 1221)   PRN Meds:.acetaminophen **OR** acetaminophen, HYDROmorphone (DILAUDID) injection, melatonin, naLOXone (NARCAN)  injection, ondansetron (ZOFRAN) IV, oxyCODONE-acetaminophen  SUBJECTIVE:   24 hour events:  No acute events  No new complaints.  Still has some pain.  She turned her stimulator back on.    Review of Systems  All other systems reviewed and are negative.     OBJECTIVE:   Blood pressure (!) 160/71, pulse 76, temperature 98.6 F (37 C), temperature source Oral, resp. rate 17, height '5\' 2"'$  (1.575 m), weight 94.3 kg, SpO2 100 %. Body mass index is 38.02 kg/m.  Physical Exam Constitutional:      General: She is not in acute distress.    Appearance: Normal appearance.  HENT:     Head: Normocephalic and atraumatic.  Eyes:     Extraocular Movements: Extraocular  movements intact.     Conjunctiva/sclera: Conjunctivae normal.  Pulmonary:     Effort: Pulmonary effort is normal. No respiratory distress.  Musculoskeletal:     Cervical back: Normal range of motion and neck supple.  Skin:    General: Skin is warm and dry.  Neurological:     General: No focal deficit present.     Mental Status: She is alert and oriented to person, place, and time.  Psychiatric:        Mood and Affect: Mood normal.        Behavior: Behavior normal.      Lab Results: Lab Results  Component Value Date   WBC 5.9 12/08/2022   HGB 10.3 (L) 12/08/2022   HCT 30.9 (L) 12/08/2022   MCV 92.8 12/08/2022   PLT 122 (L)  12/08/2022    Lab Results  Component Value Date   NA 135 12/08/2022   K 4.1 12/08/2022   CO2 29 12/08/2022   GLUCOSE 145 (H) 12/08/2022   BUN 19 12/08/2022   CREATININE 1.01 (H) 12/08/2022   CALCIUM 8.8 (L) 12/08/2022   GFRNONAA >60 12/08/2022   GFRAA 62 10/22/2020    Lab Results  Component Value Date   ALT 25 12/06/2022   AST 34 12/06/2022   ALKPHOS 153 (H) 12/06/2022   BILITOT 0.6 12/06/2022       Component Value Date/Time   CRP 3.2 (H) 12/08/2022 0625       Component Value Date/Time   ESRSEDRATE 64 (H) 12/08/2022 [id number]     I have reviewed the micro and lab results in Epic.  Imaging: No results found.   Imaging independently reviewed in Epic.    Raynelle [facility] for Infectious Disease [facility] [phone] pager 12/08/2022, 11:56 AM

## 2022-12-08 NOTE — Care Management Important Message (Signed)
Important Message  Patient Details  Name: Katelyn [name] MRN: [id number] Date of Birth: May 16, 1956   Medicare Important Message Given:  Yes     Hannah Beat 12/08/2022, 1:54 PM

## 2022-12-09 DIAGNOSIS — M4624 Osteomyelitis of vertebra, thoracic region: Secondary | ICD-10-CM

## 2022-12-09 DIAGNOSIS — M4644 Discitis, unspecified, thoracic region: Secondary | ICD-10-CM | POA: Diagnosis not present

## 2022-12-09 LAB — CBC
HCT: 28.3 % — ABNORMAL LOW (ref 36.0–46.0)
Hemoglobin: 9.7 g/dL — ABNORMAL LOW (ref 12.0–15.0)
MCH: 31.3 pg (ref 26.0–34.0)
MCHC: 34.3 g/dL (ref 30.0–36.0)
MCV: 91.3 fL (ref 80.0–100.0)
Platelets: 133 10*3/uL — ABNORMAL LOW (ref 150–400)
RBC: 3.1 MIL/uL — ABNORMAL LOW (ref 3.87–5.11)
RDW: 14.6 % (ref 11.5–15.5)
WBC: 5.6 10*3/uL (ref 4.0–10.5)
nRBC: 0 % (ref 0.0–0.2)

## 2022-12-09 LAB — GLUCOSE, CAPILLARY
Glucose-Capillary: 171 mg/dL — ABNORMAL HIGH (ref 70–99)
Glucose-Capillary: 171 mg/dL — ABNORMAL HIGH (ref 70–99)
Glucose-Capillary: 217 mg/dL — ABNORMAL HIGH (ref 70–99)
Glucose-Capillary: 226 mg/dL — ABNORMAL HIGH (ref 70–99)
Glucose-Capillary: 97 mg/dL (ref 70–99)

## 2022-12-09 LAB — BASIC METABOLIC PANEL
Anion gap: 8 (ref 5–15)
BUN: 20 mg/dL (ref 8–23)
CO2: 32 mmol/L (ref 22–32)
Calcium: 8.8 mg/dL — ABNORMAL LOW (ref 8.9–10.3)
Chloride: 95 mmol/L — ABNORMAL LOW (ref 98–111)
Creatinine, Ser: 1.01 mg/dL — ABNORMAL HIGH (ref 0.44–1.00)
GFR, Estimated: >60 mL/min (ref >60)
Glucose, Bld: 102 mg/dL — ABNORMAL HIGH (ref 70–99)
Potassium: 3.9 mmol/L (ref 3.5–5.1)
Sodium: 135 mmol/L (ref 135–145)

## 2022-12-09 LAB — MAGNESIUM: Magnesium: 1.5 mg/dL — ABNORMAL LOW (ref 1.7–2.4)

## 2022-12-09 MED ORDER — MAGNESIUM SULFATE 4 GM/100ML IV SOLN
4.0000 g | Freq: Once | INTRAVENOUS | Status: AC
  Administered 2022-12-09: 4 g via INTRAVENOUS
  Filled 2022-12-09: qty 100

## 2022-12-09 NOTE — Progress Notes (Addendum)
PROGRESS NOTE    Katelyn [name]  [id number] DOB: 15-Aug-1956 DOA: 12/05/2022 PCP: Algis Greenhouse, MD    Brief Narrative:   Katelyn [name] is a 67 y.o. female with past medical history significant for chronic diastolic congestive heart failure, essential hypertension, hyperlipidemia, anemia of chronic medical disease, type 2 diabetes mellitus, chronic low back pain s/p spinal stimulator (09/2022) who presented to [facility] ED on 2/27 under advisement by her neurosurgeon Dr. Ronnald Ramp for admission due to concerns for acute discitis/osteomyelitis noted on outpatient MRI.    Patient has been following with neurosurgery outpatient for chronic low back pain.  She underwent spine stimulator placement December 2023.  Given patient's persistent back pain repeat MRI T-spine was performed by Dr. Ronnald Ramp which showed evidence of acute discitis/osteomyelitis at the T11/T12 level without evidence of discrete epidural or paravertebral abscess.  Given these findings, Dr. Ronnald Ramp recommended patient present to the emergency department for further evaluation and management.  In the ED, temperature 99.5 F, HR 106, RR 20, BP 165/77, SpO2 96% on room air.  WBC 6.3, hemoglobin 10.4, platelets 128.  Sodium 137, potassium 3.8, chloride 97, CO2 29, glucose 261, BUN 13, creatinine 1.03.  AST 46, ALT 32, total bilirubin 0.6.  Lactic acid 2.7.  Neurosurgery was consulted who recommended medical admission and initiation of antibiotics.  Infectious disease was consulted and recommended holding IV antibiotics until biopsy performed by IR.  TRH consulted for admission for further evaluation management of acute discitis/osteomyelitis in the setting of spinal stimulator lead placement.  Assessment & Plan:    Acute discitis/osteomyelitis Lactic acidosis: resolved Lying to ED by direction of neurosurgery, Dr. Ronnald Ramp for persistent back pain with outpatient MRI findings concerning for acute osteomyelitis/discitis.  Patient is afebrile  without leukocytosis.  Complicated by recent placement of spinal nerve stimulator December 2023.  Neurosurgery was consulted who recommended admission to the medical service with IV antibiotics.  ID was consulted, Dr. Linus Salmons recommended holding antibiotics until IR aspiration/biopsy.  Patient underwent T11-T12 disc aspiration by IR on 2/28.  Seen by neurosurgery, Dr. Ronnald Ramp; who is currently recommending not to remove spinal stimulator at this time. -- Neurosurgery/infectious disease following, appreciate assistance -- WBC 6.3>7.0>5.9 -- ESR 63>64 -- CRP 1.9>3.2 -- Blood cultures x 2: No growth x 3 days -- Disc aspiration culture: No organisms noted on Gram stain, no growth x 2 days -- vancomycin/cefepime, pharmacy consulted for dosing/monitoring -- CBC daily  Thrombocytopenia -- Plt 128>142>68>122>133; not on heparin/Lovenox, started on cefepime yesterday. -- Continue monitor platelet count daily  Hypomagnesemia Magnesium 1.5, will replete. -- Repeat electrolytes in a.m.  Chronic diastolic congestive heart failure, compensated Essential hypertension -- Carvedilol 6.25 mg p.o. twice daily -- Torsemide 20 mg p.o. daily  Hyperlipidemia -- Crestor 10 mg p.o. daily  Type 2 diabetes mellitus Hemoglobin A1c 7.2.  Home regimen includes metformin 500 mg p.o. twice daily, Tresiba 110 units subcutaneously daily, -- Semglee 50u Viola qhs -- Sensitive SSI for coverage -- CBGs  qAC/HS  Anemia of chronic medical disease Hemoglobin 9.5, stable.  Chronic low back pain With neurosurgery, Dr. Ronnald Ramp outpatient.  Recent spinal stimulator placement December 2023. -- Oxycodone-acetaminophen 5-325 mg p.o. every 4 hours.  Moderate pain -- Dilaudid 0.5 mg IV every 2 hours as needed severe pain  GERD: -- Protonix 40 mg p.o. daily  Morbid obesity Body mass index is 38.02 kg/m.  Patient needs aggressive lifestyle changes/weight loss as this complicates all facets of care.  Outpatient follow-up with PCP.  DVT prophylaxis: SCDs Start: 12/05/22 2022    Code Status: Full Code Family Communication: No family present at bedside this morning  Disposition Plan:  Level of care: Med-Surg Status is: Inpatient Remains inpatient appropriate because: IV antibiotics    Consultants:  Neurosurgery, Dr. Ronnald Ramp Infectious disease  Procedures:  T11-T12 disc aspiration, IR 2/28  Antimicrobials:  Vancomycin 2/28>> Cefepime 2/28>>   Subjective: Patient seen examined at bedside, resting calmly.  Sitting in bedside chair.    Continues on vancomycin/cefepime.  Continues with mild low back pain.  Seen by neurosurgery with no plans for removal of spinal stimulator at this time.  ID recommends continue IV antibiotics with likely PICC line placement on Sunday if blood cultures remain negative. Denies headache, no dizziness, no chest pain, no fever/chills/night sweats, no nausea/vomiting/diarrhea, no abdominal pain, no cough/congestion, no focal weakness, no fatigue, no paresthesias.  No acute events overnight per nursing staff.  Objective: Vitals:   12/07/22 2017 12/08/22 0732 12/08/22 2200 12/09/22 0623  BP: 136/61 (!) 160/71 138/65 (!) 126/59  Pulse: 78 76 80 75  Resp: '18 17 18 18  '$ Temp: 98.3 F (36.8 C) 98.6 F (37 C) 98.3 F (36.8 C) 98.2 F (36.8 C)  TempSrc: Oral Oral Oral Oral  SpO2: 96% 100% 97% 97%  Weight:      Height:       No intake or output data in the 24 hours ending 12/09/22 0940  Filed Weights   12/05/22 2042  Weight: 94.3 kg    Examination:  Physical Exam: GEN: NAD, alert and oriented x 3, obese HEENT: NCAT, PERRL, EOMI, sclera clear, MMM PULM: CTAB w/o wheezes/crackles, normal respiratory effort, on room air CV: RRR w/o M/G/R GI: abd soft, NTND, NABS, no R/G/M MSK: no peripheral edema, muscle strength globally intact 5/5 bilateral upper/lower extremities NEURO: No focal deficits PSYCH: normal mood/affect       Data Reviewed: I have personally reviewed  following labs and imaging studies  CBC: Recent Labs  Lab 12/05/22 1807 12/06/22 0440 12/07/22 0255 12/08/22 0625 12/09/22 0640  WBC 6.3 7.0 4.6 5.9 5.6  NEUTROABS 4.8 4.5  --   --   --   HGB 10.4* 9.5* 8.4* 10.3* 9.7*  HCT 32.5* 29.5* 25.3* 30.9* 28.3*  MCV 94.5 95.5 93.7 92.8 91.3  PLT 128* 142* 68* 122* [id number]*   Basic Metabolic Panel: Recent Labs  Lab 12/05/22 1807 12/06/22 0710 12/07/22 0255 12/08/22 0625 12/09/22 0640  NA 137 135 132* 135 135  K 3.8 3.7 3.9 4.1 3.9  CL 97* 98 94* 97* 95*  CO2 '29 29 27 29 '$ 32  GLUCOSE 261* 155* 175* 145* 102*  BUN '13 13 16 19 20  '$ CREATININE 1.03* 0.89 1.01* 1.01* 1.01*  CALCIUM 9.1 8.7* 8.3* 8.8* 8.8*  MG 1.3* 1.6*  --  1.6* 1.5*   GFR: Estimated Creatinine Clearance: 59.4 mL/min (A) (by C-G formula based on SCr of 1.01 mg/dL (H)). Liver Function Tests: Recent Labs  Lab 12/05/22 1807 12/06/22 0710  AST 46* 34  ALT 32 25  ALKPHOS 179* 153*  BILITOT 0.6 0.6  PROT 7.2 6.6  ALBUMIN 3.1* 2.8*   No results for input(s): "LIPASE", "AMYLASE" in the last 168 hours. No results for input(s): "AMMONIA" in the last 168 hours. Coagulation Profile: Recent Labs  Lab 12/05/22 1807  INR 1.0   Cardiac Enzymes: No results for input(s): "CKTOTAL", "CKMB", "CKMBINDEX", "TROPONINI" in the last 168 hours. BNP (last 3 results) No results for input(s): "PROBNP" in  the last 8760 hours. HbA1C: Recent Labs    12/07/22 0255  HGBA1C 7.2*   CBG: Recent Labs  Lab 12/08/22 1152 12/08/22 1830 12/08/22 2033 12/09/22 0044 12/09/22 0620  GLUCAP 188* 140* 194* 171* 97   Lipid Profile: No results for input(s): "CHOL", "HDL", "LDLCALC", "TRIG", "CHOLHDL", "LDLDIRECT" in the last 72 hours. Thyroid Function Tests: No results for input(s): "TSH", "T4TOTAL", "FREET4", "T3FREE", "THYROIDAB" in the last 72 hours. Anemia Panel: No results for input(s): "VITAMINB12", "FOLATE", "FERRITIN", "TIBC", "IRON", "RETICCTPCT" in the last 72 hours. Sepsis  Labs: Recent Labs  Lab 12/05/22 1800 12/05/22 2110  LATICACIDVEN 2.7* 1.8    Recent Results (from the past 240 hour(s))  Culture, blood (Routine x 2)     Status: None (Preliminary result)   Collection Time: 12/05/22  6:00 PM   Specimen: BLOOD RIGHT ARM  Result Value Ref Range Status   Specimen Description BLOOD RIGHT ARM  Final   Special Requests   Final    AEROBIC BOTTLE ONLY Blood Culture results may not be optimal due to an inadequate volume of blood received in culture bottles   Culture   Final    NO GROWTH 3 DAYS Performed at [facility], [place] [address] [place], [place] [address]    Report Status PENDING  Incomplete  Culture, blood (Routine x 2)     Status: None (Preliminary result)   Collection Time: 12/05/22  6:55 PM   Specimen: BLOOD RIGHT HAND  Result Value Ref Range Status   Specimen Description BLOOD RIGHT HAND  Final   Special Requests   Final    BOTTLES DRAWN AEROBIC AND ANAEROBIC Blood Culture adequate volume   Culture   Final    NO GROWTH 3 DAYS Performed at [facility], [place] [address], [place], [place] [address]    Report Status PENDING  Incomplete  Aerobic/Anaerobic Culture w Gram Stain (surgical/deep wound)     Status: None (Preliminary result)   Collection Time: 12/06/22  1:00 PM   Specimen: PATH Disc; Body Fluid  Result Value Ref Range Status   Specimen Description FLUID  Final   Special Requests DISC FLUID  Final   Gram Stain NO WBC SEEN NO ORGANISMS SEEN   Final   Culture   Final    NO GROWTH 2 DAYS NO ANAEROBES ISOLATED; CULTURE IN PROGRESS FOR 5 DAYS Performed at [facility] [address] [place], [place] [address]    Report Status PENDING  Incomplete         Radiology Studies: No results found.      Scheduled Meds:  carvedilol  6.25 mg Oral BID   DULoxetine  60 mg Oral QHS   insulin aspart  0-9 Units Subcutaneous Q6H   insulin glargine-yfgn  50 Units Subcutaneous QHS   pantoprazole  40 mg Oral Daily    pramipexole  1 mg Oral QHS   pregabalin  75 mg Oral QHS   rosuvastatin  10 mg Oral Daily   torsemide  20 mg Oral Daily   Continuous Infusions:  ceFEPime (MAXIPIME) IV 2 g (12/09/22 0626)   magnesium sulfate bolus IVPB 4 g (12/09/22 0937)   vancomycin 1,000 mg (12/08/22 1238)     LOS: 4 days    Time spent: 48 minutes spent on chart review, discussion with nursing staff, consultants, updating family and interview/physical exam; more than 50% of that time was spent in counseling and/or coordination of care.    Anaria Kroner J [place]  DO [facility] Available via Epic secure chat 7am-7pm After these hours, please refer to coverage provider listed on [email or web address] 12/09/2022, 9:40 AM

## 2022-12-09 NOTE — Progress Notes (Signed)
Pharmacy Antibiotic Note  Katelyn [name] is a 67 y.o. female admitted on 12/05/2022 with discitis/osteomyelitis. Patient is s/p disc aspiration at T11-T12 on 12/06/22. Pharmacy consult for vancomycin and cefepime dosing.   Plan: Continue Vancomycin 1000 mg IV q24 hours (updated with current SCr, eAUC 519, Scr 1.01, Vd 0.5) Continue Cefepime 2g IV q8 hours F/u culture data, renal function, ability to narrow antibiotics F/u ID recommendations   Height: '5\' 2"'$  (157.5 cm) Weight: 94.3 kg (207 lb 14.3 oz) IBW/kg (Calculated) : 50.1  Temp (24hrs), Avg:98.3 F (36.8 C), Min:98.2 F (36.8 C), Max:98.3 F (36.8 C)  Recent Labs  Lab 12/05/22 1800 12/05/22 1807 12/05/22 2110 12/06/22 0440 12/06/22 0710 12/07/22 0255 12/08/22 0625 12/09/22 0640  WBC  --  6.3  --  7.0  --  4.6 5.9 5.6  CREATININE  --  1.03*  --   --  0.89 1.01* 1.01* 1.01*  LATICACIDVEN 2.7*  --  1.8  --   --   --   --   --      Estimated Creatinine Clearance: 59.4 mL/min (A) (by C-G formula based on SCr of 1.01 mg/dL (H)).    Allergies  Allergen Reactions   Atorvastatin Nausea And Vomiting and Other (See Comments)    MYALGIAS   Talwin [Pentazocine] Other (See Comments)    headache   Other Nausea Only    UNSPECIFIED Anesthesia    Antimicrobials this admission: Vancomycin 2/28 >>  Cefepime 2/28 >>   Dose adjustments this admission:  Microbiology results: 2/28 BCx: ng x 4d 2/28 WCx:  ng x 2d   Thank you for allowing [place] to participate in this patients care. Jens Som, PharmD 12/09/2022 11:54 AM  **Pharmacist phone directory can be found on [email or web address] listed under [place]**

## 2022-12-09 NOTE — Plan of Care (Signed)
  Problem: Health Behavior/Discharge Planning: Goal: Ability to manage health-related needs will improve Outcome: Progressing   Problem: Metabolic: Goal: Ability to maintain appropriate glucose levels will improve Outcome: Progressing   Problem: Nutritional: Goal: Maintenance of adequate nutrition will improve Outcome: Progressing

## 2022-12-10 ENCOUNTER — Inpatient Hospital Stay: Payer: Self-pay

## 2022-12-10 DIAGNOSIS — M4644 Discitis, unspecified, thoracic region: Secondary | ICD-10-CM | POA: Diagnosis not present

## 2022-12-10 DIAGNOSIS — M4624 Osteomyelitis of vertebra, thoracic region: Secondary | ICD-10-CM | POA: Diagnosis not present

## 2022-12-10 LAB — BASIC METABOLIC PANEL
Anion gap: 7 (ref 5–15)
BUN: 18 mg/dL (ref 8–23)
CO2: 31 mmol/L (ref 22–32)
Calcium: 8.7 mg/dL — ABNORMAL LOW (ref 8.9–10.3)
Chloride: 98 mmol/L (ref 98–111)
Creatinine, Ser: 1.07 mg/dL — ABNORMAL HIGH (ref 0.44–1.00)
GFR, Estimated: 58 mL/min — ABNORMAL LOW (ref >60)
Glucose, Bld: 136 mg/dL — ABNORMAL HIGH (ref 70–99)
Potassium: 3.6 mmol/L (ref 3.5–5.1)
Sodium: 136 mmol/L (ref 135–145)

## 2022-12-10 LAB — CBC
HCT: 31.6 % — ABNORMAL LOW (ref 36.0–46.0)
Hemoglobin: 10.3 g/dL — ABNORMAL LOW (ref 12.0–15.0)
MCH: 30.2 pg (ref 26.0–34.0)
MCHC: 32.6 g/dL (ref 30.0–36.0)
MCV: 92.7 fL (ref 80.0–100.0)
Platelets: 142 10*3/uL — ABNORMAL LOW (ref 150–400)
RBC: 3.41 MIL/uL — ABNORMAL LOW (ref 3.87–5.11)
RDW: 14.4 % (ref 11.5–15.5)
WBC: 6.3 10*3/uL (ref 4.0–10.5)
nRBC: 0 % (ref 0.0–0.2)

## 2022-12-10 LAB — CULTURE, BLOOD (ROUTINE X 2)
Culture: NO GROWTH
Culture: NO GROWTH
Special Requests: ADEQUATE

## 2022-12-10 LAB — GLUCOSE, CAPILLARY
Glucose-Capillary: 149 mg/dL — ABNORMAL HIGH (ref 70–99)
Glucose-Capillary: 165 mg/dL — ABNORMAL HIGH (ref 70–99)
Glucose-Capillary: 194 mg/dL — ABNORMAL HIGH (ref 70–99)
Glucose-Capillary: 230 mg/dL — ABNORMAL HIGH (ref 70–99)

## 2022-12-10 LAB — MAGNESIUM: Magnesium: 1.8 mg/dL (ref 1.7–2.4)

## 2022-12-10 MED ORDER — POLYETHYLENE GLYCOL 3350 17 G PO PACK: 17.0000 g | PACK | Freq: Every day | ORAL | Status: DC | PRN

## 2022-12-10 MED ORDER — SODIUM CHLORIDE 0.9% FLUSH: 10.0000 mL | INTRAVENOUS | Status: DC | PRN

## 2022-12-10 MED ORDER — SODIUM CHLORIDE 0.9% FLUSH
10.0000 mL | Freq: Two times a day (BID) | INTRAVENOUS | Status: DC
  Administered 2022-12-10 – 2022-12-11 (×2): 10 mL

## 2022-12-10 MED ORDER — CHLORHEXIDINE GLUCONATE CLOTH 2 % EX PADS
6.0000 | MEDICATED_PAD | Freq: Every day | CUTANEOUS | Status: DC
  Administered 2022-12-10 – 2022-12-11 (×2): 6 via TOPICAL

## 2022-12-10 NOTE — Progress Notes (Signed)
PROGRESS NOTE    Katelyn [name]  [id number] DOB: 24-Oct-1955 DOA: 12/05/2022 PCP: Algis Greenhouse, MD    Brief Narrative:   PRIMA STOVES is a 67 y.o. female with past medical history significant for chronic diastolic congestive heart failure, essential hypertension, hyperlipidemia, anemia of chronic medical disease, type 2 diabetes mellitus, chronic low back pain s/p spinal stimulator (09/2022) who presented to [facility] ED on 2/27 under advisement by her neurosurgeon Dr. Ronnald Ramp for admission due to concerns for acute discitis/osteomyelitis noted on outpatient MRI.    Patient has been following with neurosurgery outpatient for chronic low back pain.  She underwent spine stimulator placement December 2023.  Given patient's persistent back pain repeat MRI T-spine was performed by Dr. Ronnald Ramp which showed evidence of acute discitis/osteomyelitis at the T11/T12 level without evidence of discrete epidural or paravertebral abscess.  Given these findings, Dr. Ronnald Ramp recommended patient present to the emergency department for further evaluation and management.  In the ED, temperature 99.5 F, HR 106, RR 20, BP 165/77, SpO2 96% on room air.  WBC 6.3, hemoglobin 10.4, platelets 128.  Sodium 137, potassium 3.8, chloride 97, CO2 29, glucose 261, BUN 13, creatinine 1.03.  AST 46, ALT 32, total bilirubin 0.6.  Lactic acid 2.7.  Neurosurgery was consulted who recommended medical admission and initiation of antibiotics.  Infectious disease was consulted and recommended holding IV antibiotics until biopsy performed by IR.  TRH consulted for admission for further evaluation management of acute discitis/osteomyelitis in the setting of spinal stimulator lead placement.  Assessment & Plan:    Acute discitis/osteomyelitis Lactic acidosis: resolved Lying to ED by direction of neurosurgery, Dr. Ronnald Ramp for persistent back pain with outpatient MRI findings concerning for acute osteomyelitis/discitis.  Patient is afebrile  without leukocytosis.  Complicated by recent placement of spinal nerve stimulator December 2023.  Neurosurgery was consulted who recommended admission to the medical service with IV antibiotics.  ID was consulted, Dr. Linus Salmons recommended holding antibiotics until IR aspiration/biopsy.  Patient underwent T11-T12 disc aspiration by IR on 2/28.  Seen by neurosurgery, Dr. Ronnald Ramp; who is currently recommending not to remove spinal stimulator at this time. -- Neurosurgery/infectious disease following, appreciate assistance -- WBC 6.3>7.0>5.9>6.3 -- ESR 63>64 -- CRP 1.9>3.2 -- Blood cultures x 2: No growth x 5 days -- Disc aspiration culture: No organisms noted on Gram stain, no growth x 3 days -- vancomycin/cefepime, pharmacy consulted for dosing/monitoring --PICC line ordered today -- CBC daily  Thrombocytopenia -- Plt 128>142>68>122>133>142; not on heparin/Lovenox, but started on cefepime  Hypomagnesemia Repleted, magnesium 1.8 today.  Chronic diastolic congestive heart failure, compensated Essential hypertension -- Carvedilol 6.25 mg p.o. twice daily -- Torsemide 20 mg p.o. daily  Hyperlipidemia -- Crestor 10 mg p.o. daily  Type 2 diabetes mellitus Hemoglobin A1c 7.2.  Home regimen includes metformin 500 mg p.o. twice daily, Tresiba 110 units subcutaneously daily, -- Semglee 50u [place] qhs -- Sensitive SSI for coverage -- CBGs  qAC/HS  Anemia of chronic medical disease Hemoglobin 9.5, stable.  Chronic low back pain With neurosurgery, Dr. Ronnald Ramp outpatient.  Recent spinal stimulator placement December 2023. -- Oxycodone-acetaminophen 5-325 mg p.o. every 4 hours.  Moderate pain -- Dilaudid 0.5 mg IV every 2 hours as needed severe pain  GERD: -- Protonix 40 mg p.o. daily  Morbid obesity Body mass index is 38.02 kg/m.  Patient needs aggressive lifestyle changes/weight loss as this complicates all facets of care.  Outpatient follow-up with PCP.     DVT prophylaxis: SCDs Start:  12/05/22  2022    Code Status: Full Code Family Communication: No family present at bedside this morning  Disposition Plan:  Level of care: Med-Surg Status is: Inpatient Remains inpatient appropriate because: IV antibiotics    Consultants:  Neurosurgery, Dr. Ronnald Ramp Infectious disease  Procedures:  T11-T12 disc aspiration, IR 2/28  Antimicrobials:  Vancomycin 2/28>> Cefepime 2/28>>   Subjective: Patient seen examined at bedside, resting calmly.  Lying in bed.  NT present at bedside taking vital signs.  Remains on IV antibiotics.  Blood cultures show no growth x 5 days, will place PICC line today.  Hopeful for discharge in 1-2 days on outpatient antibiotics.  No other specific questions or concerns at this time.   Denies headache, no dizziness, no chest pain, no fever/chills/night sweats, no nausea/vomiting/diarrhea, no abdominal pain, no cough/congestion, no focal weakness, no fatigue, no paresthesias.  No acute events overnight per nursing staff.  Objective: Vitals:   12/09/22 1949 12/10/22 0528 12/10/22 0808 12/10/22 0942  BP: (!) 141/59 (!) 143/63 (!) 133/119 (!) 159/59  Pulse: 73 83 76 78  Resp: '18 18 16   '$ Temp: 97.7 F (36.5 C) 98.7 F (37.1 C) 97.8 F (36.6 C)   TempSrc: Oral Oral    SpO2: 97% 97% 96%   Weight:      Height:        Intake/Output Summary (Last 24 hours) at 12/10/2022 0946 Last data filed at 12/09/2022 2354 Gross per 24 hour  Intake 840 ml  Output --  Net 840 ml    Filed Weights   12/05/22 2042  Weight: 94.3 kg    Examination:  Physical Exam: GEN: NAD, alert and oriented x 3, obese HEENT: NCAT, PERRL, EOMI, sclera clear, MMM PULM: CTAB w/o wheezes/crackles, normal respiratory effort, on room air CV: RRR w/o M/G/R GI: abd soft, NTND, NABS, no R/G/M MSK: no peripheral edema, muscle strength globally intact 5/5 bilateral upper/lower extremities NEURO: No focal deficits PSYCH: normal mood/affect       Data Reviewed: I have personally reviewed  following labs and imaging studies  CBC: Recent Labs  Lab 12/05/22 1807 12/06/22 0440 12/07/22 0255 12/08/22 0625 12/09/22 0640 12/10/22 0746  WBC 6.3 7.0 4.6 5.9 5.6 6.3  NEUTROABS 4.8 4.5  --   --   --   --   HGB 10.4* 9.5* 8.4* 10.3* 9.7* 10.3*  HCT 32.5* 29.5* 25.3* 30.9* 28.3* 31.6*  MCV 94.5 95.5 93.7 92.8 91.3 92.7  PLT 128* 142* 68* 122* 133* [id number]*   Basic Metabolic Panel: Recent Labs  Lab 12/05/22 1807 12/06/22 0710 12/07/22 0255 12/08/22 0625 12/09/22 0640 12/10/22 0746  NA 137 135 132* 135 135 136  K 3.8 3.7 3.9 4.1 3.9 3.6  CL 97* 98 94* 97* 95* 98  CO2 '29 29 27 29 '$ 32 31  GLUCOSE 261* 155* 175* 145* 102* 136*  BUN '13 13 16 19 20 18  '$ CREATININE 1.03* 0.89 1.01* 1.01* 1.01* 1.07*  CALCIUM 9.1 8.7* 8.3* 8.8* 8.8* 8.7*  MG 1.3* 1.6*  --  1.6* 1.5* 1.8   GFR: Estimated Creatinine Clearance: 56.1 mL/min (A) (by C-G formula based on SCr of 1.07 mg/dL (H)). Liver Function Tests: Recent Labs  Lab 12/05/22 1807 12/06/22 0710  AST 46* 34  ALT 32 25  ALKPHOS 179* 153*  BILITOT 0.6 0.6  PROT 7.2 6.6  ALBUMIN 3.1* 2.8*   No results for input(s): "LIPASE", "AMYLASE" in the last 168 hours. No results for input(s): "AMMONIA" in the last 168  hours. Coagulation Profile: Recent Labs  Lab 12/05/22 1807  INR 1.0   Cardiac Enzymes: No results for input(s): "CKTOTAL", "CKMB", "CKMBINDEX", "TROPONINI" in the last 168 hours. BNP (last 3 results) No results for input(s): "PROBNP" in the last 8760 hours. HbA1C: No results for input(s): "HGBA1C" in the last 72 hours.  CBG: Recent Labs  Lab 12/09/22 0620 12/09/22 1220 12/09/22 1745 12/09/22 2350 12/10/22 0529  GLUCAP 97 171* 217* 226* 165*   Lipid Profile: No results for input(s): "CHOL", "HDL", "LDLCALC", "TRIG", "CHOLHDL", "LDLDIRECT" in the last 72 hours. Thyroid Function Tests: No results for input(s): "TSH", "T4TOTAL", "FREET4", "T3FREE", "THYROIDAB" in the last 72 hours. Anemia Panel: No results for  input(s): "VITAMINB12", "FOLATE", "FERRITIN", "TIBC", "IRON", "RETICCTPCT" in the last 72 hours. Sepsis Labs: Recent Labs  Lab 12/05/22 1800 12/05/22 2110  LATICACIDVEN 2.7* 1.8    Recent Results (from the past 240 hour(s))  Culture, blood (Routine x 2)     Status: None   Collection Time: 12/05/22  6:00 PM   Specimen: BLOOD RIGHT ARM  Result Value Ref Range Status   Specimen Description BLOOD RIGHT ARM  Final   Special Requests   Final    AEROBIC BOTTLE ONLY Blood Culture results may not be optimal due to an inadequate volume of blood received in culture bottles   Culture   Final    NO GROWTH 5 DAYS Performed at [facility], [place] [address], [place], [place] [address]    Report Status 12/10/2022 FINAL  Final  Culture, blood (Routine x 2)     Status: None   Collection Time: 12/05/22  6:55 PM   Specimen: BLOOD RIGHT HAND  Result Value Ref Range Status   Specimen Description BLOOD RIGHT HAND  Final   Special Requests   Final    BOTTLES DRAWN AEROBIC AND ANAEROBIC Blood Culture adequate volume   Culture   Final    NO GROWTH 5 DAYS Performed at [facility], [place] [address], [place], [place] [address]    Report Status 12/10/2022 FINAL  Final  Aerobic/Anaerobic Culture w Gram Stain (surgical/deep wound)     Status: None (Preliminary result)   Collection Time: 12/06/22  1:00 PM   Specimen: PATH Disc; Body Fluid  Result Value Ref Range Status   Specimen Description FLUID  Final   Special Requests DISC FLUID  Final   Gram Stain NO WBC SEEN NO ORGANISMS SEEN   Final   Culture   Final    NO GROWTH 3 DAYS NO ANAEROBES ISOLATED; CULTURE IN PROGRESS FOR 5 DAYS Performed at [facility], [address] [place], [place] [address]    Report Status PENDING  Incomplete         Radiology Studies: [place] EKG SITE RITE  Result Date: 12/10/2022 If Site Rite image not attached, placement could not be confirmed due to current cardiac rhythm.       Scheduled Meds:   carvedilol  6.25 mg Oral BID   DULoxetine  60 mg Oral QHS   insulin aspart  0-9 Units Subcutaneous Q6H   insulin glargine-yfgn  50 Units Subcutaneous QHS   pantoprazole  40 mg Oral Daily   pramipexole  1 mg Oral QHS   pregabalin  75 mg Oral QHS   rosuvastatin  10 mg Oral Daily   torsemide  20 mg Oral Daily   Continuous Infusions:  ceFEPime (MAXIPIME) IV 2 g (12/10/22 0541)   vancomycin 1,000 mg (12/09/22 1320)  LOS: 5 days    Time spent: 48 minutes spent on chart review, discussion with nursing staff, consultants, updating family and interview/physical exam; more than 50% of that time was spent in counseling and/or coordination of care.    Nethaniel Mattie J [place] DO [facility] Available via Epic secure chat 7am-7pm After these hours, please refer to coverage provider listed on [email or web address] 12/10/2022, 9:46 AM

## 2022-12-10 NOTE — Plan of Care (Signed)
  Problem: Metabolic: Goal: Ability to maintain appropriate glucose levels will improve Outcome: Progressing   Problem: Nutritional: Goal: Maintenance of adequate nutrition will improve Outcome: Progressing   Problem: Activity: Goal: Risk for activity intolerance will decrease Outcome: Progressing

## 2022-12-10 NOTE — Progress Notes (Signed)
Peripherally Inserted Central Catheter Placement  The IV Nurse has discussed with the patient and/or persons authorized to consent for the patient, the purpose of this procedure and the potential benefits and risks involved with this procedure.  The benefits include less needle sticks, lab draws from the catheter, and the patient may be discharged home with the catheter. Risks include, but not limited to, infection, bleeding, blood clot (thrombus formation), and puncture of an artery; nerve damage and irregular heartbeat and possibility to perform a PICC exchange if needed/ordered by physician.  Alternatives to this procedure were also discussed.  Bard Power PICC patient education guide, fact sheet on infection prevention and patient information card has been provided to patient /or left at bedside.    PICC Placement Documentation  PICC Single Lumen 99991111 Right Cephalic 34 cm 0 cm (Active)  Indication for Insertion or Continuance of Line Home intravenous therapies (PICC only) 12/10/22 1157  Exposed Catheter (cm) 0 cm 12/10/22 1157  Site Assessment Clean, Dry, Intact 12/10/22 1157  Line Status Saline locked;Flushed;Blood return noted 12/10/22 1157  Dressing Type Transparent;Securing device 12/10/22 1157  Dressing Status Antimicrobial disc in place;Clean, Dry, Intact 12/10/22 1157  Safety Lock Not Applicable [id number]  Line Care Connections checked and tightened 12/10/22 1157  Line Adjustment (NICU/IV Team Only) No 12/10/22 1157  Dressing Intervention New dressing 12/10/22 1157  Dressing Change Due 12/17/22 12/10/22 [place] 12/10/2022, 11:58 AM

## 2022-12-11 DIAGNOSIS — M4644 Discitis, unspecified, thoracic region: Secondary | ICD-10-CM | POA: Diagnosis not present

## 2022-12-11 DIAGNOSIS — M4624 Osteomyelitis of vertebra, thoracic region: Secondary | ICD-10-CM | POA: Diagnosis not present

## 2022-12-11 DIAGNOSIS — M4640 Discitis, unspecified, site unspecified: Secondary | ICD-10-CM | POA: Diagnosis not present

## 2022-12-11 LAB — AEROBIC/ANAEROBIC CULTURE W GRAM STAIN (SURGICAL/DEEP WOUND)
Culture: NO GROWTH
Gram Stain: NONE SEEN

## 2022-12-11 LAB — GLUCOSE, CAPILLARY
Glucose-Capillary: 133 mg/dL — ABNORMAL HIGH (ref 70–99)
Glucose-Capillary: 200 mg/dL — ABNORMAL HIGH (ref 70–99)
Glucose-Capillary: 186 mg/dL — ABNORMAL HIGH (ref 70–99)

## 2022-12-11 MED ORDER — VANCOMYCIN IV (FOR PTA / DISCHARGE USE ONLY): 1000.0000 mg | INTRAVENOUS | 0 refills | Status: DC

## 2022-12-11 MED ORDER — DAPTOMYCIN IV (FOR PTA / DISCHARGE USE ONLY): 600.0000 mg | INTRAVENOUS | 0 refills | Status: DC

## 2022-12-11 MED ORDER — CEFEPIME IV (FOR PTA / DISCHARGE USE ONLY): 2.0000 g | Freq: Two times a day (BID) | INTRAVENOUS | 0 refills | Status: DC

## 2022-12-11 MED ORDER — SODIUM CHLORIDE 0.9 % IV SOLN: 2.0000 g | Freq: Two times a day (BID) | INTRAVENOUS | Status: DC

## 2022-12-11 MED ORDER — HEPARIN SOD (PORK) LOCK FLUSH 100 UNIT/ML IV SOLN
250.0000 [IU] | INTRAVENOUS | Status: AC | PRN
  Administered 2022-12-11: 250 [IU]

## 2022-12-11 MED ORDER — CEFEPIME IV (FOR PTA / DISCHARGE USE ONLY): 2.0000 g | Freq: Two times a day (BID) | INTRAVENOUS | 0 refills | Status: AC

## 2022-12-11 MED ORDER — HYDROCODONE-ACETAMINOPHEN 5-325 MG PO TABS: 1.0000 | ORAL_TABLET | Freq: Four times a day (QID) | ORAL | 0 refills | Status: DC | PRN

## 2022-12-11 NOTE — Plan of Care (Signed)
  Problem: Metabolic: Goal: Ability to maintain appropriate glucose levels will improve Outcome: Progressing   Problem: Skin Integrity: Goal: Risk for impaired skin integrity will decrease Outcome: Progressing   Problem: Education: Goal: Knowledge of General Education information will improve Description: Including pain rating scale, medication(s)/side effects and non-pharmacologic comfort measures Outcome: Progressing

## 2022-12-11 NOTE — Progress Notes (Signed)
PHARMACY CONSULT NOTE FOR:  OUTPATIENT  PARENTERAL ANTIBIOTIC THERAPY (OPAT)  Indication: Discitis/osteomyelitis Regimen: Vancomycin 1000 mg every 24 hours + Cefepime 2 gm IV Q 12 hours  End date: 01/17/23  IV antibiotic discharge orders are pended. To discharging provider:  please sign these orders via discharge navigator,  Select New Orders & click on the button choice - Manage This Unsigned Work.     Thank you for allowing pharmacy to be a part of this patient's care.  Jimmy Footman, PharmD, BCPS, [facility] Clinical Pharmacist Phone: [phone] 12/11/2022, 9:32 AM

## 2022-12-11 NOTE — Progress Notes (Addendum)
[facility]  Date of Admission:  12/05/2022           Reason for visit: Follow up on Spinal infection                                      Current antibiotics: Vancomycin Cefepime  ASSESSMENT:    67 y.o. female admitted with:  T11-T12 discitis-osteomyelitis with epidural phlegmon from T8-T9 through L1-L2: This is in the setting of recent spinal stimulator placement with MRI showing soft tissue swelling and enhancement surrounding the spinal cord stimulator leads in the posterior midline upper back.  Status post IR aspiration 12/06/2022 with cultures that are no growth to date.  Blood cultures also no growth to date as well.  PICC line placed 12/10/22.  Seen by NSGY this admission and current plan is for medical management with IV antibiotics given hesitancy to remove (and not be able to replace) spinal stimulator. History of lumbar spine surgery: Status post lumbar instrumented fusion in November 0000000 complicated by wound dehiscence requiring I&D and January 2018 with cultures at that time which grew Serratia marcescens. Type 2 diabetes: Hemoglobin A1c 7.2.  RECOMMENDATIONS:    See OPAT note below Will sign off, please call as needed Will consider transition to oral suppressive regimen at completion of IV therapy  Diagnosis: Discitis/OM  Culture Result: No growth  Allergies  Allergen Reactions   Atorvastatin Nausea And Vomiting and Other (See Comments)    MYALGIAS   Talwin [Pentazocine] Other (See Comments)    headache   Other Nausea Only    UNSPECIFIED Anesthesia    OPAT Orders Discharge antibiotics to be given via PICC line Discharge antibiotics: Per pharmacy protocol  Daptomycin Cefepime  ADDENDUM 11:13 AM : Patient prefers to stay on vancomycin due to cost so will plan for OPAT with Vancomycin per pharmacy  Duration: 6 weeks  End Date: 01/17/23  [facility] Care Per Protocol:  Home health RN for IV administration and teaching; PICC  line care and labs.    Labs weekly while on IV antibiotics: _xxx_ CBC with differential __ BMP _xxx_ CMP _xxx_ CRP _xxx_ ESR _xxx_ Vancomycin trough __ CK  _xxx_ Please pull PIC at completion of IV antibiotics __ Please leave PIC in place until doctor has seen patient or been notified  Fax weekly labs to [phone]  Clinic Follow Up Appt: 12/26/22 at 4pm with Juleen China    Principal Problem:   Diskitis Active Problems:   Hyperlipidemia   Chronic diastolic CHF (congestive heart failure) (HCC)   DM2 (diabetes mellitus, type 2) (HCC)   Restless leg syndrome   Essential hypertension   GAD (generalized anxiety disorder)   Lactic acidosis   Hypomagnesemia   Anemia of chronic disease   Infection of spinal cord stimulator (HCC)    MEDICATIONS:    Scheduled Meds:  carvedilol  6.25 mg Oral BID   Chlorhexidine Gluconate Cloth  6 each Topical Daily   DULoxetine  60 mg Oral QHS   insulin aspart  0-9 Units Subcutaneous Q6H   insulin glargine-yfgn  50 Units Subcutaneous QHS   pantoprazole  40 mg Oral Daily   pramipexole  1 mg Oral QHS   pregabalin  75 mg Oral QHS   rosuvastatin  10 mg Oral Daily   sodium chloride flush  10-40 mL Intracatheter Q12H   torsemide  20 mg Oral  Daily   Continuous Infusions:  ceFEPime (MAXIPIME) IV 2 g (12/11/22 [id number])   vancomycin 1,000 mg (12/10/22 1221)   PRN Meds:.acetaminophen **OR** acetaminophen, HYDROmorphone (DILAUDID) injection, melatonin, naLOXone (NARCAN)  injection, ondansetron (ZOFRAN) IV, oxyCODONE-acetaminophen, polyethylene glycol, sodium chloride flush  SUBJECTIVE:   24 hour events:  No acute events PICC placed Labs stable   She is looking forward to going home.  Some pain/swelling at right [facility]  Review of Systems  All other systems reviewed and are negative.     OBJECTIVE:   Blood pressure 122/60, pulse 76, temperature 98 F (36.7 C), temperature source Oral, resp. rate 18, height '5\' 2"'$  (1.575 m), weight 94.3 kg,  SpO2 94 %. Body mass index is 38.02 kg/m.  Physical Exam Constitutional:      Appearance: Normal appearance.  Pulmonary:     Effort: Pulmonary effort is normal. No respiratory distress.  Musculoskeletal:     Comments: Right PICC in place. Right AC with mild induration/swelling.  No erythema, purulence.  Neurological:     General: No focal deficit present.     Mental Status: She is alert and oriented to person, place, and time.  Psychiatric:        Mood and Affect: Mood normal.        Behavior: Behavior normal.      Lab Results: Lab Results  Component Value Date   WBC 6.3 12/10/2022   HGB 10.3 (L) 12/10/2022   HCT 31.6 (L) 12/10/2022   MCV 92.7 12/10/2022   PLT 142 (L) 12/10/2022    Lab Results  Component Value Date   NA 136 12/10/2022   K 3.6 12/10/2022   CO2 31 12/10/2022   GLUCOSE 136 (H) 12/10/2022   BUN 18 12/10/2022   CREATININE 1.07 (H) 12/10/2022   CALCIUM 8.7 (L) 12/10/2022   GFRNONAA 58 (L) 12/10/2022   GFRAA 62 10/22/2020    Lab Results  Component Value Date   ALT 25 12/06/2022   AST 34 12/06/2022   ALKPHOS 153 (H) 12/06/2022   BILITOT 0.6 12/06/2022       Component Value Date/Time   CRP 3.2 (H) 12/08/2022 0625       Component Value Date/Time   ESRSEDRATE 64 (H) 12/08/2022 [id number]     I have reviewed the micro and lab results in Epic.  Imaging: [place] EKG SITE RITE  Result Date: 12/10/2022 If Site Rite image not attached, placement could not be confirmed due to current cardiac rhythm.    Imaging independently reviewed in Epic.    [facility] [phone] pager 12/11/2022, 9:19 AM  I have personally spent 50 minutes involved in face-to-face and non-face-to-face activities for this patient on the day of the visit. Professional time spent includes the following activities: Preparing to see the patient (review of tests), Obtaining and/or reviewing separately obtained history  (admission/discharge record), Performing a medically appropriate examination and/or evaluation , Ordering medications/tests/procedures, referring and communicating with other health care professionals, Documenting clinical information in the EMR, Independently interpreting results (not separately reported), Communicating results to the patient/family/caregiver, Counseling and educating the patient/family/caregiver and Care coordination (not separately reported).

## 2022-12-11 NOTE — Discharge Summary (Addendum)
Physician Discharge Summary  Katelyn [name] [id number] DOB: May 04, 1956 DOA: 12/05/2022  PCP: Algis Greenhouse, MD  Admit date: 12/05/2022 Discharge date: 12/11/2022  Admitted From: Home Disposition: Home  Recommendations for Outpatient Follow-up:  Follow up with PCP in 1-2 weeks Outpatient follow-up with neurosurgery, Dr. Ronnald Ramp Follow-up with infectious disease, Dr. Juleen China as scheduled on 12/26/2022 Continue antibiotics with vancomycin and cefepime with projected end date of 01/17/2023 for thoracic osteomyelitis, discitis. CBC with differential, BMP, CK level weekly, ESR/CRP every other week  Home Health: Home health RN for IV antibiotics Equipment/Devices: PICC line  Discharge Condition: Stable CODE STATUS: Full code Diet recommendation: Heart healthy/consistent carbohydrate diet  History of present illness:  Katelyn [name] is a 67 y.o. female with past medical history significant for chronic diastolic congestive heart failure, essential hypertension, hyperlipidemia, anemia of chronic medical disease, type 2 diabetes mellitus, chronic low back pain s/p spinal stimulator (09/2022) who presented to [facility] ED on 2/27 under advisement by her neurosurgeon Dr. Ronnald Ramp for admission due to concerns for acute discitis/osteomyelitis noted on outpatient MRI.     Patient has been following with neurosurgery outpatient for chronic low back pain.  She underwent spine stimulator placement December 2023.  Given patient's persistent back pain repeat MRI T-spine was performed by Dr. Ronnald Ramp which showed evidence of acute discitis/osteomyelitis at the T11/T12 level without evidence of discrete epidural or paravertebral abscess.  Given these findings, Dr. Ronnald Ramp recommended patient present to the emergency department for further evaluation and management.   In the ED, temperature 99.5 F, HR 106, RR 20, BP 165/77, SpO2 96% on room air.  WBC 6.3, hemoglobin 10.4, platelets 128.  Sodium 137, potassium 3.8,  chloride 97, CO2 29, glucose 261, BUN 13, creatinine 1.03.  AST 46, ALT 32, total bilirubin 0.6.  Lactic acid 2.7.  Neurosurgery was consulted who recommended medical admission and initiation of antibiotics.  Infectious disease was consulted and recommended holding IV antibiotics until biopsy performed by IR.  TRH consulted for admission for further evaluation management of acute discitis/osteomyelitis in the setting of spinal stimulator lead placement.  Hospital course:  Acute discitis/osteomyelitis Lactic acidosis: resolved Lying to ED by direction of neurosurgery, Dr. Ronnald Ramp for persistent back pain with outpatient MRI findings concerning for acute osteomyelitis/discitis.  Patient is afebrile without leukocytosis.  Complicated by recent placement of spinal nerve stimulator December 2023.  Neurosurgery was consulted who recommended admission to the medical service with IV antibiotics.  ID was consulted, Dr. Linus Salmons recommended holding antibiotics until IR aspiration/biopsy.  Patient underwent T11-T12 disc aspiration by IR on 2/28.  Seen by neurosurgery, Dr. Ronnald Ramp; who is currently recommending not to remove spinal stimulator at this time.  Blood cultures x 2 with no growth x 5 days.  Disc aspiration culture with no organisms identified on Gram stain, showed no growth during hospitalization.  ID recommended outpatient antibiotics with vancomycin and cefepime with projected end date of 01/17/2023.  Will need weekly CBC with differential, BMP, CK level and every other week ESR/CRP.  Outpatient follow-up with infectious disease Dr. Juleen China on 12/26/2022.  PICC line placed.   Thrombocytopenia Stable, platelet count 142 at time of discharge.   Hypomagnesemia Repleted, magnesium 1.8 today.   Chronic diastolic congestive heart failure, compensated Essential hypertension Continue home carvedilol, torsemide   Hyperlipidemia Crestor 10 mg p.o. daily   Type 2 diabetes mellitus Hemoglobin A1c 7.2.  Home  regimen includes metformin 500 mg p.o. twice daily, Tresiba 110 units subcutaneously daily, NovoLog.  Anemia of chronic medical disease Hemoglobin 10.3, stable.   Chronic low back pain With neurosurgery, Dr. Ronnald Ramp outpatient.  Recent spinal stimulator placement December 2023.  Continue Norco as needed.  Outpatient follow-up with neurosurgery.   GERD: Continue PPI.   Morbid obesity Body mass index is 38.02 kg/m.  Patient needs aggressive lifestyle changes/weight loss as this complicates all facets of care.  Outpatient follow-up with PCP.    Discharge Diagnoses:  Principal Problem:   Diskitis Active Problems:   Hyperlipidemia   Chronic diastolic CHF (congestive heart failure) (HCC)   DM2 (diabetes mellitus, type 2) (HCC)   Restless leg syndrome   Essential hypertension   GAD (generalized anxiety disorder)   Lactic acidosis   Hypomagnesemia   Anemia of chronic disease   Infection of spinal cord stimulator [facility])    Discharge Instructions  Discharge Instructions     [facility] pharmacist to adjust dose for Vancomycin, Aminoglycosides and other anti-infective therapies as requested by physician.   Complete by: As directed    [facility] to provide Cath Flo '2mg'$    Complete by: As directed    Administer for PICC line occlusion and as ordered by physician for other access device issues.   Anaphylaxis Kit: Provided to treat any anaphylactic reaction to the medication being provided to the patient if First Dose or when requested by physician   Complete by: As directed    Epinephrine '1mg'$ /ml vial / amp: Administer 0.'3mg'$  (0.59m) subcutaneously once for moderate to severe anaphylaxis, nurse to call physician and pharmacy when reaction occurs and call 911 if needed for immediate care   Diphenhydramine '50mg'$ /ml IV vial: Administer 25-'50mg'$  IV/IM PRN for first dose reaction, rash, itching, mild reaction, nurse to call physician and pharmacy when reaction occurs    Sodium Chloride 0.9% NS 5080mIV: Administer if needed for hypovolemic blood pressure drop or as ordered by physician after call to physician with anaphylactic reaction   Change dressing on IV access line weekly and PRN   Complete by: As directed    Diet - low sodium heart healthy   Complete by: As directed    Flush IV access with Sodium Chloride 0.9% and Heparin 10 units/ml or 100 units/ml   Complete by: As directed    Home infusion instructions - [facility]   Complete by: As directed    Instructions: Flush IV access with Sodium Chloride 0.9% and Heparin 10units/ml or 100units/ml   Change dressing on IV access line: Weekly and PRN   Instructions Cath Flo '2mg'$ : Administer for PICC Line occlusion and as ordered by physician for other access device   [facility] pharmacist to adjust dose for: Vancomycin, Aminoglycosides and other anti-infective therapies as requested by physician   Increase activity slowly   Complete by: As directed    Method of administration may be changed at the discretion of home infusion pharmacist based upon assessment of the patient and/or caregiver's ability to self-administer the medication ordered   Complete by: As directed    No wound care   Complete by: As directed       Allergies as of 12/11/2022       Reactions   Atorvastatin Nausea And Vomiting, Other (See Comments)   MYALGIAS   Talwin [pentazocine] Other (See Comments)   headache   Other Nausea Only   UNSPECIFIED Anesthesia        Medication List     TAKE these medications    acetaminophen 325 MG  tablet Commonly known as: TYLENOL Take 2 tablets (650 mg total) by mouth every 4 (four) hours as needed for headache or mild pain.   allopurinol 100 MG tablet Commonly known as: ZYLOPRIM Take 100 mg by mouth at bedtime.   aspirin EC 81 MG tablet Take 81 mg by mouth daily. **presently on hold for surgery**   carvedilol 6.25 MG tablet Commonly known as: COREG Take 1 tablet  (6.25 mg total) by mouth 2 (two) times daily. Take 6.25 mg by mouth 2 (two) times daily. / Needs appointment for further refills   ceFEPime  IVPB Commonly known as: MAXIPIME Inject 2 g into the vein every 12 (twelve) hours. Indication:  Discitis/osteomyelitis  First Dose: Yes Last Day of Therapy:  01/17/23  Labs - Once weekly:  CBC/D and BMP, Labs - Every other week:  ESR and CRP Method of administration: IV Push Method of administration may be changed at the discretion of home infusion pharmacist based upon assessment of the patient and/or caregiver's ability to self-administer the medication ordered.   cyclobenzaprine 5 MG tablet Commonly known as: FLEXERIL Take 5 mg by mouth 3 (three) times daily as needed for muscle spasms.   daptomycin  IVPB Commonly known as: CUBICIN Inject 600 mg into the vein daily. Indication:  Discitis/Osteomyelitis  First Dose: Yes Last Day of Therapy: 01/17/23  Labs - Once weekly:  CBC/D, BMP, and CPK Labs - Every other week:  ESR and CRP Method of administration: IV Push Method of administration may be changed at the discretion of home infusion pharmacist based upon assessment of the patient and/or caregiver's ability to self-administer the medication ordered.   DULoxetine 60 MG capsule Commonly known as: CYMBALTA Take 60 mg by mouth at bedtime.   GLUCOSAMINE-CHONDROITIN PO Take 1 tablet by mouth 2 (two) times daily.   Hair Skin and Nails Formula Tabs Take 1 tablet by mouth daily.   HYDROcodone-acetaminophen 5-325 MG tablet Commonly known as: NORCO/VICODIN Take 1 tablet by mouth every 6 (six) hours as needed for moderate pain.   metFORMIN 500 MG 24 hr tablet Commonly known as: GLUCOPHAGE-XR Take 500 mg by mouth 2 (two) times daily.   mirtazapine 15 MG tablet Commonly known as: REMERON Take 15 mg by mouth at bedtime.   Mounjaro 5 MG/0.5ML Pen Generic drug: tirzepatide Inject 5 mg into the skin once a week.   multivitamin with minerals  Tabs tablet Take 1 tablet by mouth daily.   nitroGLYCERIN 0.4 MG SL tablet Commonly known as: NITROSTAT Place 0.4 mg under the tongue every 5 (five) minutes as needed for chest pain.   NovoLIN 70/30 (70-30) 100 UNIT/ML injection Generic drug: insulin NPH-regular Human Inject 25-30 Units into the skin 3 (three) times daily. Per sliding scale   NovoLOG FlexPen 100 UNIT/ML FlexPen Generic drug: insulin aspart Inject 55 Units into the skin 3 (three) times daily with meals as needed for high blood sugar. As needed for blood sugar over 150. Add 1 unit for every point over 150   pantoprazole 40 MG tablet Commonly known as: PROTONIX Take 1 tablet by mouth once daily   pramipexole 1 MG tablet Commonly known as: MIRAPEX Take 1 mg by mouth at bedtime.   pregabalin 75 MG capsule Commonly known as: LYRICA Take 75 mg by mouth at bedtime.   rosuvastatin 10 MG tablet Commonly known as: CRESTOR Take 10 mg by mouth daily.   torsemide 20 MG tablet Commonly known as: DEMADEX Take one tablet by mouth once daily  on 'Sunday, Tuesday, Thursday, and Saturday. Then take one tablet by mouth twice daily on Monday, Wednesday, and Friday. What changed:  how much to take how to take this when to take this additional instructions   Tresiba FlexTouch 200 UNIT/ML FlexTouch Pen Generic drug: insulin degludec Inject 110 Units into the skin daily.   valACYclovir 1000 MG tablet Commonly known as: VALTREX Take 1,000 mg by mouth daily as needed (fever blisters).               Discharge Care Instructions  (From admission, onward)           Start     Ordered   12/11/22 0000  Change dressing on IV access line weekly and PRN  (Home infusion instructions - [facility] )        03'$ /04/24 1106            Follow-up Information     Dough, Jaymes Graff, MD. Schedule an appointment as soon as possible for a visit in 1 week(s).   Specialty: Family Medicine Contact information: [address] [place] [address] [phone]         Eustace Moore, MD. Schedule an appointment as soon as possible for a visit.   Specialty: Neurosurgery Contact information: [address] [place] [address] [phone]         Mignon Pine, DO. Go on 12/26/2022.   Specialties: Infectious Diseases, Internal Medicine Contact information: [address] [place] [address] [phone]                Allergies  Allergen Reactions   Atorvastatin Nausea And Vomiting and Other (See Comments)    MYALGIAS   Talwin [Pentazocine] Other (See Comments)    headache   Other Nausea Only    UNSPECIFIED Anesthesia    Consultations: Neurosurgery, Dr. Ronnald Ramp Infectious disease Interventional radiology   Procedures/Studies: [place] EKG SITE RITE  Result Date: 12/10/2022 If Site Rite image not attached, placement could not be confirmed due to current cardiac rhythm.  MR THORACIC SPINE W WO CONTRAST  Result Date: 12/05/2022 CLINICAL DATA:  Acute osteomyelitis. EXAM: MRI THORACIC WITHOUT AND WITH CONTRAST TECHNIQUE: Multiplanar and multiecho pulse sequences of the thoracic spine were obtained without and with intravenous contrast. CONTRAST:  20m GADAVIST GADOBUTROL 1 MMOL/ML IV SOLN COMPARISON:  CT abdomen pelvis dated November 30, 2022. MRI thoracic spine dated June 16, 2021. MRI lumbar spine dated November 09, 2020. FINDINGS: Alignment:  Trace retrolisthesis at T11-T12. Vertebrae: Prominent abnormal fluid and enhancement of the T11-T12 disc space with adjacent endplate erosion and diffuse marrow edema and enhancement involving the T11 and T12 vertebral bodies, consistent with osteomyelitis discitis. No acute fracture or suspicious bone lesion. Partially visualized lumbar fusion hardware. Cord: Normal signal and morphology. Dorsal epidural enhancement from T8-T9 through T12-L1. Ventral epidural enhancement from T10-T11 through L1-L2. No  discrete epidural fluid collection. No abnormal intrathecal enhancement. Paraspinal and other soft tissues: Paravertebral inflammatory changes at T11-T12. No fluid collection. Spinal cord stimulator entering the dorsal epidural space at T8-T9 with the lead tips at T6. Epidural enhancement around the spinal cord stimulator lead tips. Soft tissue edema and enhancement surrounding the spinal stimulator leads in the superficial soft tissues posterior to the epidural insertion site. No fluid collection. Low T1 and T2 signal artifact in the thoracic paraspinous muscles is unchanged compared to prior study. Disc levels: T1-T2: Negative disc. Mild bilateral facet arthropathy. No stenosis. T2-T3:  Small broad-based posterior disc protrusion and mild bilateral facet arthropathy. No stenosis. T3-T4: Small right paracentral disc protrusion. Mild bilateral facet arthropathy. No stenosis. T4-T5: Mild disc bulging.  No stenosis. T5-T6: Small circumferential disc osteophyte complex.  No stenosis. T6-T7: Mild disc bulging.  No stenosis. T7-T8: Mild disc bulging.  No stenosis. T8-T9: Mild disc bulging.  No stenosis. T9-T10: Negative. T10-T11: Mild disc bulging.  No stenosis. T11-T12: Discitis osteomyelitis. Bilateral facet arthropathy. Moderate spinal canal stenosis. Moderate to severe left and severe right neuroforaminal stenosis. IMPRESSION: 1. 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 Ventral epidural enhancement/phlegmon extending from T10-T11 through L1-L2. Dorsal epidural enhancement/phlegmon from T8-T9 through L1-L2. No discrete epidural or paravertebral abscess. 2. Dorsal epidural enhancement extends to and surrounds the spinal cord stimulator leads. There is also some superficial soft tissue swelling and enhancement surrounding the spinal cord stimulator leads in the posterior midline upper back. Some of this may be residual postsurgical changes given recent placement of the stimulator two and a half months ago, but infected  spinal cord stimulator leads should be considered. 3. 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 These results will be called to the ordering clinician or representative by the Radiologist Assistant, and communication documented in the PACS or [employer]. Electronically Signed   By: Titus Dubin M.D.   On: 12/05/2022 14:23     Subjective:   Discharge Exam: Vitals:   12/11/22 0434 12/11/22 0823  BP: (!) 118/47 122/60  Pulse: 69 76  Resp: 18 18  Temp:  98 F (36.7 C)  SpO2: 95% 94%   Vitals:   12/10/22 1821 12/10/22 1933 12/11/22 0434 12/11/22 0823  BP: (!) 151/55 (!) 139/56 (!) 118/47 122/60  Pulse: 75 74 69 76  Resp: '16 18 18 18  '$ Temp:  98 F (36.7 C)  98 F (36.7 C)  TempSrc:  Oral  Oral  SpO2: 97% 97% 95% 94%  Weight:      Height:        Physical Exam: GEN: NAD, alert and oriented x 3, obese HEENT: NCAT, PERRL, EOMI, sclera clear, MMM PULM: CTAB w/o wheezes/crackles, normal respiratory effort, on room air CV: RRR w/o M/G/R GI: abd soft, NTND, NABS, no R/G/M MSK: no peripheral edema, muscle strength globally intact 5/5 bilateral upper/lower extremities NEURO: No focal deficits PSYCH: normal mood/affect Integumentary: Midline surgical incision site as depicted below as well as battery site.  No other concerning rashes/lesions/wounds noted on exposed skin surfaces.        The results of significant diagnostics from this hospitalization (including imaging, microbiology, ancillary and laboratory) are listed below for reference.     Microbiology: Recent Results (from the past 240 hour(s))  Culture, blood (Routine x 2)     Status: None   Collection Time: 12/05/22  6:00 PM   Specimen: BLOOD RIGHT ARM  Result Value Ref Range Status   Specimen Description BLOOD RIGHT ARM  Final   Special Requests   Final    AEROBIC BOTTLE ONLY Blood Culture results may not be optimal due to an inadequate volume of blood  received in culture bottles   Culture   Final    NO GROWTH 5 DAYS Performed at [facility], [place] [address], [place], [place] [address]    Report Status 12/10/2022 FINAL  Final  Culture, blood (Routine x 2)     Status: None   Collection Time: 12/05/22  6:55 PM   Specimen: BLOOD RIGHT HAND  Result Value  Ref Range Status   Specimen Description BLOOD RIGHT HAND  Final   Special Requests   Final    BOTTLES DRAWN AEROBIC AND ANAEROBIC Blood Culture adequate volume   Culture   Final    NO GROWTH 5 DAYS Performed at [facility], [address], [place], [place] [address]    Report Status 12/10/2022 FINAL  Final  Aerobic/Anaerobic Culture w Gram Stain (surgical/deep wound)     Status: None (Preliminary result)   Collection Time: 12/06/22  1:00 PM   Specimen: PATH Disc; Body Fluid  Result Value Ref Range Status   Specimen Description FLUID  Final   Special Requests DISC FLUID  Final   Gram Stain NO WBC SEEN NO ORGANISMS SEEN   Final   Culture   Final    NO GROWTH 4 DAYS NO ANAEROBES ISOLATED; CULTURE IN PROGRESS FOR 5 DAYS Performed at [facility], [address], [place], [place] [address]    Report Status PENDING  Incomplete     Labs: BNP (last 3 results) Recent Labs    12/06/22 0440  BNP [id number]   Basic Metabolic Panel: Recent Labs  Lab 12/05/22 1807 12/06/22 0710 12/07/22 0255 12/08/22 0625 12/09/22 0640 12/10/22 0746  NA 137 135 132* 135 135 136  K 3.8 3.7 3.9 4.1 3.9 3.6  CL 97* 98 94* 97* 95* 98  CO2 '29 29 27 29 '$ 32 31  GLUCOSE 261* 155* 175* 145* 102* 136*  BUN '13 13 16 19 20 18  '$ CREATININE 1.03* 0.89 1.01* 1.01* 1.01* 1.07*  CALCIUM 9.1 8.7* 8.3* 8.8* 8.8* 8.7*  MG 1.3* 1.6*  --  1.6* 1.5* 1.8   Liver Function Tests: Recent Labs  Lab 12/05/22 1807 12/06/22 0710  AST 46* 34  ALT 32 25  ALKPHOS 179* 153*  BILITOT 0.6 0.6  PROT 7.2 6.6  ALBUMIN 3.1* 2.8*   No results for input(s): "LIPASE", "AMYLASE" in the last 168 hours. No  results for input(s): "AMMONIA" in the last 168 hours. CBC: Recent Labs  Lab 12/05/22 1807 12/06/22 0440 12/07/22 0255 12/08/22 0625 12/09/22 0640 12/10/22 0746  WBC 6.3 7.0 4.6 5.9 5.6 6.3  NEUTROABS 4.8 4.5  --   --   --   --   HGB 10.4* 9.5* 8.4* 10.3* 9.7* 10.3*  HCT 32.5* 29.5* 25.3* 30.9* 28.3* 31.6*  MCV 94.5 95.5 93.7 92.8 91.3 92.7  PLT 128* 142* 68* 122* 133* 142*   Cardiac Enzymes: No results for input(s): "CKTOTAL", "CKMB", "CKMBINDEX", "TROPONINI" in the last 168 hours. BNP: Invalid input(s): "POCBNP" CBG: Recent Labs  Lab 12/10/22 1117 12/10/22 1718 12/10/22 2355 12/11/22 0615 12/11/22 0822  GLUCAP 230* 194* 149* 133* 200*   D-Dimer No results for input(s): "DDIMER" in the last 72 hours. Hgb A1c No results for input(s): "HGBA1C" in the last 72 hours. Lipid Profile No results for input(s): "CHOL", "HDL", "LDLCALC", "TRIG", "CHOLHDL", "LDLDIRECT" in the last 72 hours. Thyroid function studies No results for input(s): "TSH", "T4TOTAL", "T3FREE", "THYROIDAB" in the last 72 hours.  Invalid input(s): "FREET3" Anemia work up No results for input(s): "VITAMINB12", "FOLATE", "FERRITIN", "TIBC", "IRON", "RETICCTPCT" in the last 72 hours. Urinalysis    Component Value Date/Time   COLORURINE YELLOW 10/07/2020 0902   APPEARANCEUR HAZY (A) 10/07/2020 0902   LABSPEC 1.018 10/07/2020 0902   PHURINE 5.0 10/07/2020 0902   GLUCOSEU >=500 (A) 10/07/2020 0902   HGBUR NEGATIVE 10/07/2020 0902   BILIRUBINUR NEGATIVE 10/07/2020 0902   KETONESUR NEGATIVE  10/07/2020 0902   PROTEINUR NEGATIVE 10/07/2020 0902   UROBILINOGEN 0.2 01/31/2012 1504   NITRITE POSITIVE (A) 10/07/2020 0902   LEUKOCYTESUR MODERATE (A) 10/07/2020 0902   Sepsis Labs Recent Labs  Lab 12/07/22 0255 12/08/22 0625 12/09/22 0640 12/10/22 0746  WBC 4.6 5.9 5.6 6.3   Microbiology Recent Results (from the past 240 hour(s))  Culture, blood (Routine x 2)     Status: None   Collection Time:  12/05/22  6:00 PM   Specimen: BLOOD RIGHT ARM  Result Value Ref Range Status   Specimen Description BLOOD RIGHT ARM  Final   Special Requests   Final    AEROBIC BOTTLE ONLY Blood Culture results may not be optimal due to an inadequate volume of blood received in culture bottles   Culture   Final    NO GROWTH 5 DAYS Performed at [facility], [place] [address], [place], [place] [address]    Report Status 12/10/2022 FINAL  Final  Culture, blood (Routine x 2)     Status: None   Collection Time: 12/05/22  6:55 PM   Specimen: BLOOD RIGHT HAND  Result Value Ref Range Status   Specimen Description BLOOD RIGHT HAND  Final   Special Requests   Final    BOTTLES DRAWN AEROBIC AND ANAEROBIC Blood Culture adequate volume   Culture   Final    NO GROWTH 5 DAYS Performed at [facility], [place] [address], [place], [place] [address]    Report Status 12/10/2022 FINAL  Final  Aerobic/Anaerobic Culture w Gram Stain (surgical/deep wound)     Status: None (Preliminary result)   Collection Time: 12/06/22  1:00 PM   Specimen: PATH Disc; Body Fluid  Result Value Ref Range Status   Specimen Description FLUID  Final   Special Requests DISC FLUID  Final   Gram Stain NO WBC SEEN NO ORGANISMS SEEN   Final   Culture   Final    NO GROWTH 4 DAYS NO ANAEROBES ISOLATED; CULTURE IN PROGRESS FOR 5 DAYS Performed at [facility], [address] [place] [address] [place], [place] [address]    Report Status PENDING  Incomplete     Time coordinating discharge: Over 30 minutes  SIGNED:   Hadlei Stitt J [place] DO  [facility] 12/11/2022, 11:12 AM

## 2022-12-11 NOTE — Progress Notes (Signed)
Discharge information reviewed. Patient verbalized understanding and all questions were answered.

## 2022-12-11 NOTE — TOC Progression Note (Signed)
Transition of Care [facility]) - Progression Note    Patient Details  Name: Katelyn [name] MRN: [id number] Date of Birth: 1955/11/11  Transition [facility]) CM/SW Contact  Sharin Mons, RN Phone Number: 12/11/2022, 8:39 AM  Clinical Narrative:    Patient will DC to: home Anticipated DC date: 12/11/2022 Family notified: yes Transport by: car                Acute discitis/ osteomyelitis @ T11-T12  Per MD patient ready for DC today.RN, patient, patient's husband, and facility notified of DC. Pt will require LT IV antibiotic therapy. Pt agreeable. Pt without provider preference. Referrals made with Jeannene Patella / [facility] and [facility], both accepted. Home infusion teaching completed by Jeannene Patella. Husband to assist with care once discharge. Pt without RX med concerns. Post hospital f/u noted on AVS.  Husband to provide transportation to home.  RNCM will sign off for now as intervention is no longer needed. Please consult [place] again if new needs arise.   Expected Discharge Plan: Home/Self Care Barriers to Discharge: Continued Medical Work up  Expected Discharge Plan and Services                         DME Arranged: Other see comment (IV ABX therapy) DME Agency: Other - Comment ([facility]) Date DME Agency Contacted: 12/11/22 Time DME Agency Contacted: [phone] Representative spoke with at DME Agency: Pam [facility] Arranged: RN [place] Agency: [place] Date [place]: 12/11/22 Time [place]: [phone] Representative spoke with at [place]: [place] Determinants of Health ([place]) Interventions SDOH Screenings   Tobacco Use: Low Risk  (12/05/2022)    Readmission Risk Interventions     No data to display

## 2022-12-21 ENCOUNTER — Ambulatory Visit: Payer: PPO | Admitting: Cardiology

## 2022-12-23 DIAGNOSIS — I447 Left bundle-branch block, unspecified: Secondary | ICD-10-CM | POA: Diagnosis not present

## 2022-12-23 LAB — LAB REPORT - SCANNED: EGFR: 32

## 2022-12-26 ENCOUNTER — Encounter: Payer: Self-pay | Admitting: Internal Medicine

## 2022-12-26 ENCOUNTER — Other Ambulatory Visit: Payer: Self-pay

## 2022-12-26 ENCOUNTER — Ambulatory Visit (INDEPENDENT_AMBULATORY_CARE_PROVIDER_SITE_OTHER): Payer: PPO | Admitting: Internal Medicine

## 2022-12-26 VITALS — BP 150/77 | HR 73 | Temp 97.3°F | Ht 62.0 in | Wt 200.4 lb

## 2022-12-26 DIAGNOSIS — M4645 Discitis, unspecified, thoracolumbar region: Secondary | ICD-10-CM

## 2022-12-26 DIAGNOSIS — T85733D Infection and inflammatory reaction due to implanted electronic neurostimulator of spinal cord, electrode (lead), subsequent encounter: Secondary | ICD-10-CM | POA: Diagnosis not present

## 2022-12-26 DIAGNOSIS — G062 Extradural and subdural abscess, unspecified: Secondary | ICD-10-CM

## 2022-12-26 NOTE — Assessment & Plan Note (Addendum)
Patient here today for follow-up.  She is currently on vancomycin and cefepime via PICC line with a planned end date of 01/17/2023 (6 weeks) due to discitis/osteomyelitis with epidural phlegmon from T8-T9 through L1-L2.  This is in the setting of recent spinal cord stimulator that neurosurgery was hesitant to remove given that she likely would be unable to have it replaced in the future.  However, presumably this was involved in the infectious process and unable to definitively say one way or another.  She also has a history of lumbar instrumented fusion complicated by wound dehiscence in 2017/18 with cultures at that time growing Serratia marcescens.  Continue current antibiotics and will follow up in 3 weeks.  Home health and pharmacy are helping to adjust her vancomycin doses and monitoring her creatinine closely.  Given she had findings of epidural phlegmon will also repeat MRI to ensure no abscess development or other complication.  At the end of IV therapy, will need to consider whether or not to transition to an oral suppressive regimen.

## 2022-12-26 NOTE — Progress Notes (Signed)
[facility]  CHIEF COMPLAINT:    Follow up for discitis/osteomyelitis  SUBJECTIVE:    Katelyn [name] is a 67 y.o. female with PMHx as below who presents to the clinic for discitis/osteomyelitis.   Patient was admitted at [facility] from 12/05/2022 to 12/11/2022.  She was found to have T11-T12 discitis-osteomyelitis with epidural phlegmon from T8-T9 through L1-L2.  This occurred in the setting of a recent spinal stimulator placement with MRI at admission showing soft tissue swelling and enhancement surrounding the spinal cord stimulator leads in the posterior midline back.  She underwent IR guided aspiration 12/06/2022 with cultures that finalized as no growth.  Her blood cultures were also no growth.  She was seen by neurosurgery during her admission who advocated for no surgical intervention and leaving the spinal stimulator in place given that she had received some pain benefit from it and it would be unlikely that they could replace it in the future.    She also has a history of lumbar spine surgery status post lumbar instrumented fusion in November 2017.  This was complicated by wound dehiscence requiring I&D in January 2018 with cultures at that time growing Serratia marcescens.  She has had no issues with this wound during the interval from then until her current presentation.    She had a PICC line placed at discharge and was sent home on cefepime 2 g IV every 12 hours and vancomycin 1000 mg every 24 hours.  We attempted to send her home on daptomycin but this was found to be cost prohibitive for the patient.  Her OPAT labs have shown some fluctuating creatinine and her vancomycin leves have been elevated but pharmacy is working to adjust the dosing.  She reports having a fall when she returned home and she thinks she overdid it this week with work around the house.  Her back has been hurting her more as a result.  She saw Dr Ronnald Ramp today who felt this could be  from her infection.  She sees him again in about 1 month.   Please see A&P for the details of today's visit and status of the patient's medical problems.   Patient's Medications  New Prescriptions   No medications on file  Previous Medications   ACETAMINOPHEN (TYLENOL) 325 MG TABLET    Take 2 tablets (650 mg total) by mouth every 4 (four) hours as needed for headache or mild pain.   ALLOPURINOL (ZYLOPRIM) 100 MG TABLET    Take 100 mg by mouth at bedtime.   ASPIRIN EC 81 MG TABLET    Take 81 mg by mouth daily. **presently on hold for surgery**   CARVEDILOL (COREG) 6.25 MG TABLET    Take 1 tablet (6.25 mg total) by mouth 2 (two) times daily. Take 6.25 mg by mouth 2 (two) times daily. / Needs appointment for further refills   CEFEPIME (MAXIPIME) IVPB    Inject 2 g into the vein every 12 (twelve) hours. Indication:  Discitis/osteomyelitis  First Dose: Yes Last Day of Therapy:  01/17/23  Labs - Once weekly:  CBC/D and BMP, Labs - Every other week:  ESR and CRP Method of administration: IV Push Method of administration may be changed at the discretion of home infusion pharmacist based upon assessment of the patient and/or caregiver's ability to self-administer the medication ordered.   CYCLOBENZAPRINE (FLEXERIL) 5 MG TABLET    Take 5 mg by mouth 3 (three) times daily  as needed for muscle spasms.   DULOXETINE (CYMBALTA) 60 MG CAPSULE    Take 60 mg by mouth at bedtime.    GLUCOSAMINE-CHONDROITIN PO    Take 1 tablet by mouth 2 (two) times daily.   HYDROCODONE-ACETAMINOPHEN (NORCO/VICODIN) 5-325 MG TABLET    Take 1 tablet by mouth every 6 (six) hours as needed for moderate pain.   METFORMIN (GLUCOPHAGE-XR) 500 MG 24 HR TABLET    Take 500 mg by mouth 2 (two) times daily.   MIRTAZAPINE (REMERON) 15 MG TABLET    Take 15 mg by mouth at bedtime.   MOUNJARO 5 MG/0.5ML PEN    Inject 5 mg into the skin once a week.   MULTIPLE VITAMIN (MULTIVITAMIN WITH MINERALS) TABS TABLET    Take 1 tablet by mouth daily.    MULTIPLE VITAMINS-MINERALS (HAIR SKIN AND NAILS FORMULA) TABS    Take 1 tablet by mouth daily.   NITROGLYCERIN (NITROSTAT) 0.4 MG SL TABLET    Place 0.4 mg under the tongue every 5 (five) minutes as needed for chest pain.    NOVOLIN 70/30 (70-30) 100 UNIT/ML INJECTION    Inject 25-30 Units into the skin 3 (three) times daily. Per sliding scale   NOVOLOG FLEXPEN 100 UNIT/ML FLEXPEN    Inject 55 Units into the skin 3 (three) times daily with meals as needed for high blood sugar. As needed for blood sugar over 150. Add 1 unit for every point over 150   PANTOPRAZOLE (PROTONIX) 40 MG TABLET    Take 1 tablet by mouth once daily   PRAMIPEXOLE (MIRAPEX) 1 MG TABLET    Take 1 mg by mouth at bedtime.   PREGABALIN (LYRICA) 75 MG CAPSULE    Take 75 mg by mouth at bedtime.   ROSUVASTATIN (CRESTOR) 10 MG TABLET    Take 10 mg by mouth daily.   TORSEMIDE (DEMADEX) 20 MG TABLET    Take one tablet by mouth once daily on Sunday, Tuesday, Thursday, and Saturday. Then take one tablet by mouth twice daily on Monday, Wednesday, and Friday.   TRESIBA FLEXTOUCH 200 UNIT/ML FLEXTOUCH PEN    Inject 110 Units into the skin daily.   VALACYCLOVIR (VALTREX) 1000 MG TABLET    Take 1,000 mg by mouth daily as needed (fever blisters).   VANCOMYCIN IVPB    Inject 1,000 mg into the vein daily. Indication:  Discitis/Osteomyelitis  First Dose: Yes Last Day of Therapy:  01/17/23  Labs - Sunday/Monday:  CBC/D, BMP, and vancomycin trough. Labs - Thursday:  BMP and vancomycin trough Labs - Every other week:  ESR and CRP Method of administration:Elastomeric Method of administration may be changed at the discretion of the patient and/or caregiver's ability to self-administer the medication ordered.  Modified Medications   No medications on file  Discontinued Medications   No medications on file      Past Medical History:  Diagnosis Date   Anxiety    Arthritis    Bursitis of right hip    CAD (coronary artery disease)    Cardiac  catheterization June 2014 in [place] - 50% circumflex stenosis   Chest pain, neg MI, stable CAD non obstructive on cath 10/05/20 10/04/2020   Chronic diastolic heart failure ([place]) 08/20/2017   Chronic kidney disease, stage 3 ([place])    does not see nephrologist   Chronic low back pain without sciatica 03/14/2016   CKD (chronic kidney disease), stage III ([place]) [id number]   Complication of anesthesia    Cough 04/19/2017  Overview:  Last Assessment & Plan:  Formatting of this note may be different from the original. Cough - ? ACE related with AR triggers   Plan  Patient Instructions  Discuss with your primary doctor that lisinopril pain, need making your cough worse. May use Mucinex DM twice daily as needed for cough and congestion Zyrtec 10 mg at bedtime as needed for drainage Saline nasal spray as needed. Lab tests today Activity as tolerated. Follow with Dr. [name] in 3-4 months and As needed   Please contact office for sooner follow up if symptoms do not improve or worsen or seek emergency care    Depression    Dyspnea    with exertion   " lazy lung" - per  [name] from back issues- 06/2016   Elevated liver enzymes 12/05/2016   Essential hypertension    GERD (gastroesophageal reflux disease)    Gout 03/14/2016   Greater trochanteric bursitis of right hip 02/02/2012   H/O hiatal hernia    Heart murmur    History of blood transfusion 2016   History of esophageal stricture 10/07/2020   History of kidney stones    Hypercholesterolemia    Hypertensive heart disease with heart failure ([place]) 01/01/2017   Hypoxia 10/07/2020   Iliotibial band syndrome of right side 02/02/2012   Iron deficiency anemia due to chronic blood loss 03/14/2016   LBBB (left bundle branch block) 01/01/2017   Left bundle branch block    Leg weakness 10/07/2020   Lumbar stenosis    Meralgia paraesthetica 12/05/2016   Mild CAD 11/24/2015   Morbid obesity ([place]) 10/07/2020   Neuropathy    OSA (obstructive sleep apnea) 05/24/2016    Overview:  Managed PULM   PONV (postoperative nausea and vomiting)    "no N/V with patch"   Restless leg syndrome    S/P lumbar laminectomy 11/26/2015   S/P lumbar spinal fusion 08/29/2016   Tinnitus 12/05/2016   Type 2 diabetes mellitus ([place])    UTI (urinary tract infection) 10/07/2020    Social History   Tobacco Use   Smoking status: Never   Smokeless tobacco: Never   Tobacco comments:    Prior secondhand smoke  Vaping Use   Vaping Use: Never used  Substance Use Topics   Alcohol use: Yes    Alcohol/week: 0.0 standard drinks of alcohol    Comment: Rarely   Drug use: No    Family History  Problem Relation Age of Onset   Lung cancer Father        smoked   Hypertension Father    Stroke Father    Heart failure Mother    Bone cancer Sister    Asthma Sister     Allergies  Allergen Reactions   Atorvastatin Nausea And Vomiting and Other (See Comments)    MYALGIAS   [place] [Pentazocine] Other (See Comments)    headache   Other Nausea Only    UNSPECIFIED Anesthesia    Review of Systems  All other systems reviewed and are negative.  Except as noted above.   OBJECTIVE:    Vitals:   12/26/22 1541 12/26/22 1542  BP: (!) 164/70 (!) 150/77  Pulse: 73 73  Temp: (!) 97.3 F (36.3 C) (!) 97.3 F (36.3 C)  TempSrc: Temporal Temporal  Weight: 200 lb 6.4 oz (90.9 kg)   Height: 5\' 2"  (1.575 m)    Body mass index is 36.65 kg/m.  Physical Exam Constitutional:      Appearance: Normal appearance.  HENT:     Head: Normocephalic and atraumatic.  Eyes:     Extraocular Movements: Extraocular movements intact.     Conjunctiva/sclera: Conjunctivae normal.  Pulmonary:     Effort: Pulmonary effort is normal. No respiratory distress.  Abdominal:     General: There is no distension.     Palpations: Abdomen is soft.  Musculoskeletal:        General: Normal range of motion.     Comments: PICC line in place.   Skin:    General: Skin is warm and dry.  Neurological:      General: No focal deficit present.     Mental Status: She is alert and oriented to person, place, and time.  Psychiatric:        Mood and Affect: Mood normal.        Behavior: Behavior normal.      Labs and Microbiology:    Latest Ref Rng & Units 12/10/2022    7:46 AM 12/09/2022    6:40 AM 12/08/2022    6:25 AM  CBC  WBC 4.0 - 10.5 K/uL 6.3  5.6  5.9   Hemoglobin 12.0 - 15.0 g/dL 10.3  9.7  10.3   Hematocrit 36.0 - 46.0 % 31.6  28.3  30.9   Platelets 150 - 400 K/uL 142  133  122       Latest Ref Rng & Units 12/10/2022    7:46 AM 12/09/2022    6:40 AM 12/08/2022    6:25 AM  CMP  Glucose 70 - 99 mg/dL 136  102  145   BUN 8 - 23 mg/dL 18  20  19    Creatinine 0.44 - 1.00 mg/dL 1.07  1.01  1.01   Sodium 135 - 145 mmol/L 136  135  135   Potassium 3.5 - 5.1 mmol/L 3.6  3.9  4.1   Chloride 98 - 111 mmol/L 98  95  97   CO2 22 - 32 mmol/L 31  32  29   Calcium 8.9 - 10.3 mg/dL 8.7  8.8  8.8      No results found for this or any previous visit (from the past 240 hour(s)).  Imaging:    ASSESSMENT & PLAN:    Diskitis Patient here today for follow-up.  She is currently on vancomycin and cefepime via PICC line with a planned end date of 01/17/2023 (6 weeks) due to discitis/osteomyelitis with epidural phlegmon from T8-T9 through L1-L2.  This is in the setting of recent spinal cord stimulator that neurosurgery was hesitant to remove given that she likely would be unable to have it replaced in the future.  However, presumably this was involved in the infectious process and unable to definitively say one way or another.  She also has a history of lumbar instrumented fusion complicated by wound dehiscence in 2017/18 with cultures at that time growing Serratia marcescens.  Continue current antibiotics and will follow up in 3 weeks.  Home health and pharmacy are helping to adjust her vancomycin doses and monitoring her creatinine closely.  Given she had findings of epidural phlegmon will also repeat MRI to  ensure no abscess development or other complication.  At the end of IV therapy, will need to consider whether or not to transition to an oral suppressive regimen.     Orders Placed This Encounter  Procedures   MR THORACIC SPINE W WO CONTRAST    Standing Status:   Future    Standing Expiration Date:  12/26/2023    Order Specific Question:   GRA to provide read?    Answer:   Yes    Order Specific Question:   If indicated for the ordered procedure, I authorize the administration of contrast media per Radiology protocol    Answer:   Yes    Order Specific Question:   What is the patient's sedation requirement?    Answer:   No Sedation    Order Specific Question:   Does the patient have a pacemaker or implanted devices?    Answer:   Yes    Order Specific Question:   Manufacturer of pacemake or implanted device?    Answer:   spinal stimulator    Order Specific Question:   Year of pacemaker or implanted device?    Answer:   10/18/2022    Order Specific Question:   Preferred imaging location?    Answer:   External   MR Lumbar Spine W Wo Contrast    Standing Status:   Future    Standing Expiration Date:   12/26/2023    Order Specific Question:   If indicated for the ordered procedure, I authorize the administration of contrast media per Radiology protocol    Answer:   Yes    Order Specific Question:   What is the patient's sedation requirement?    Answer:   No Sedation    Order Specific Question:   Does the patient have a pacemaker or implanted devices?    Answer:   Yes    Order Specific Question:   Manufacturer of pacemake or implanted device?    Answer:   spinal stimulator    Order Specific Question:   Year of pacemaker or implanted device?    Answer:   10/18/2022    Order Specific Question:   Preferred imaging location?    Answer:   External        [facility] 12/26/2022, 4:16 PM   I have personally spent 40  minutes involved in face-to-face and non-face-to-face activities for this patient on the day of the visit. Professional time spent includes the following activities: Preparing to see the patient (review of tests), Obtaining and/or reviewing separately obtained history (admission/discharge record), Performing a medically appropriate examination and/or evaluation , Ordering medications/tests/procedures, referring and communicating with other health care professionals, Documenting clinical information in the EMR, Independently interpreting results (not separately reported), Communicating results to the patient/family/caregiver, Counseling and educating the patient/family/caregiver and Care coordination (not separately reported).

## 2022-12-29 LAB — LAB REPORT - SCANNED: EGFR: 24

## 2023-01-01 ENCOUNTER — Ambulatory Visit: Payer: PPO | Admitting: Cardiology

## 2023-01-02 ENCOUNTER — Other Ambulatory Visit (HOSPITAL_COMMUNITY): Payer: Self-pay

## 2023-01-02 ENCOUNTER — Telehealth: Payer: Self-pay | Admitting: Pharmacist

## 2023-01-02 NOTE — Telephone Encounter (Signed)
Patient's labs have worsened since she saw Dr. Juleen China on 12/26/22. Trend is as follows:  SCr: 3/7 1.37 3/11 1.24 3/18 1.59 3/21 2.26 3/25 2/21  VT: 3/7 15.1 3/11 19.5 3/18 21.8 3/21 26.9 3/25 pending  Dapto was cost prohibitive when checked before discharge from the hospital. She is currently receiving vanc and cefepime until 01/17/23 for discitis/osteo. Will send to Dr. Juleen China as a FYI. I have reached out to the pharmacist at [facility] to see how they are adjusting/holding vanc and am awaiting on a response.  Vilma Will L. Desarea Ohagan, PharmD, BCIDP, [place], CPP Clinical Pharmacist Practitioner [facility] 01/02/2023, 4:13 PM

## 2023-01-03 LAB — LAB REPORT - SCANNED: EGFR: 24

## 2023-01-03 NOTE — Telephone Encounter (Signed)
It does! We will call her this afternoon.

## 2023-01-03 NOTE — Telephone Encounter (Signed)
Vanc level from 3/25 is still elevated at 26.1 (it was pending). I think she will have enough vanc in her system for a good bit. Are you ok with holding off on linezolid until we at least check another vanc level tomorrow? I gave orders to [facility] to check ASAP so hopefully they will tomorrow.

## 2023-01-03 NOTE — Telephone Encounter (Signed)
Linezolid would probably be fine for a few weeks. I really only worry about it with methadone and/or citalopram now after I went to a few talks at ID week and reviewed the data. It is $100 for 2 weeks though for her, that is the bad part. I think doxycycline would be reasonable too. Just let me know what you want to do! She will have vancomycin in her system for several days probably anyway.

## 2023-01-05 NOTE — Telephone Encounter (Signed)
Me too. I think one problem was that [facility] didn't stop it when they initially saw the labs creeping up. They just adjusted it to q48h. I also had them dose adjust the cefepime too since her CrCl is now ~28 ml/min. Asked them to adjust to 2gm IV q24h

## 2023-01-05 NOTE — Telephone Encounter (Signed)
Received her stat labs from yesterday. SCr is worse at 2.89, VT still elevated at 21.5. I am going to give them orders to continue to hold the vanc through the weekend and repeat labs on Monday as I assume no labs will be drawn on Sunday because it is Easter. I feel like she will continue to have vanc in her system for several days. Sounds ok?

## 2023-01-07 NOTE — Progress Notes (Unsigned)
Cardiology Office Note:    Date:  01/08/2023   ID:  Katelyn [name], DOB [birth year], MRN [id number]  PCP:  Algis Greenhouse, MD  Cardiologist:  Shirlee More, MD    Referring MD: Algis Greenhouse, MD   prior to recent hospitalization CT scan showed hepatic cirrhosis with portal hypertension she was unaware of this finding may play a role in her edema.   ASSESSMENT:    1. Hypertensive heart disease with heart failure   2. LBBB (left bundle branch block)   3. Mild CAD   4. OSA (obstructive sleep apnea)    PLAN:    In order of problems listed above:  She is fluid overloaded as a consequence of IV antibiotics IV fluids and perhaps an element of portal hypertension with her cirrhosis.  I asked her to take her diuretic twice daily contact me by the end of the week if unimproved and she required dose of IV diuretic to initiate diuresis clinically I do not feel she has decompensated heart failure. Stable left bundle branch block CAD Blood pressure should normalize with diuresis continue her beta-blocker. Stable sleep apnea   Next appointment: 3 months   Medication Adjustments/Labs and Tests Ordered: Current medicines are reviewed at length with the patient today.  Concerns regarding medicines are outlined above.  No orders of the defined types were placed in this encounter.  No orders of the defined types were placed in this encounter.   She is concerned about increasing edema   History of Present Illness:    Katelyn [name] is a 67 y.o. female with a hx of mild CAD left bundle branch block hypertension restrictive lung disease which shortness of breath due to deconditioning obesity and hyperlipidemia with statin intolerance last seen soon 06/22/2022.  She was seen by pulmonary at 11/14/2021 for severe obstructive sleep apnea is treated with BiPAP and oxygen and notation that she had COVID-19 infection in November. She underwent repeat coronary angiography 10/05/2020 which showed mild  nonobstructive CAD low normal ejection fraction estimated 50% elevated filling pressure mild elevation of pulmonary artery pressure and right heart pressures. Echocardiogram done at [facility] 10/06/2020 showed EF normal at 50 to 55% mild LVH indeterminate diastolic pressure normal right heart function and no significant valvular abnormality.  Compliance with diet, lifestyle and medications: Yes  She was recently admitted to [facility] discharge 12/23/2022 with a diagnosis of hypothermia hypoglycemia hyponatremia hypokalemia anemia and acute kidney injury.  The admission notes that she had a recent diagnosis of spinal cord stimulator placement with vertebral osteomyelitis requiring hospitalization and IV antibiotic therapy.  Discharge labs including hemoglobin 9.0 sodium 129 creatinine 1.6 potassium 4.2  While hospitalized she had a troponin checked it was undetectable her initial creatinine was 1.6 with a GFR of 32 cc/min.  She had a CT done in hospital and it showed findings of cirrhosis and portal hypertension with splenomegaly and small esophageal varices.  Monitor strips showed sinus rhythm  With hospitalization and IV antibiotics she has increasing lower extremity edema Was also noted to have cirrhosis and portal hypertension when she was in the hospital she was unaware of the CT finding She is not short of breath she just finds herself to be very anxious and somewhat emotionally distressed by her recent hospitalization No chest pain palpitation or syncope she has increased lower extremity edema                                      .  Past Medical History:  Diagnosis Date   Anxiety    Arthritis    Bursitis of right hip    CAD (coronary artery disease)    Cardiac catheterization June 2014 in [place] - 50% circumflex stenosis   Chest pain, neg MI, stable CAD  non obstructive on cath 10/05/20 10/04/2020   Chronic diastolic heart failure [id number]   Chronic kidney disease, stage 3    does not see nephrologist   Chronic low back pain without sciatica 03/14/2016   CKD (chronic kidney disease), stage III [id number]   Complication of anesthesia    Cough 04/19/2017   Overview:  Last Assessment & Plan:  Formatting of this note may be different from the original. Cough - ? ACE related with AR triggers   Plan  Patient Instructions  Discuss with your primary doctor that lisinopril pain, need making your cough worse. May use Mucinex DM twice daily as needed for cough and congestion Zyrtec 10 mg at bedtime as needed for drainage Saline nasal spray as needed. Lab tests today Activity as tolerated. Follow with Dr. [name] in 3-4 months and As needed   Please contact office for sooner follow up if symptoms do not improve or worsen or seek emergency care    Depression    Dyspnea    with exertion   " lazy lung" - per  [name] from back issues- 06/2016   Elevated liver enzymes 12/05/2016   Essential hypertension    GERD (gastroesophageal reflux disease)    Gout 03/14/2016   Greater trochanteric bursitis of right hip 02/02/2012   H/O hiatal hernia    Heart murmur    History of blood transfusion 2016   History of esophageal stricture 10/07/2020   History of kidney stones    Hypercholesterolemia    Hypertensive heart disease with heart failure 01/01/2017   Hypoxia 10/07/2020   Iliotibial band syndrome of right side 02/02/2012   Iron deficiency anemia due to chronic blood loss 03/14/2016   LBBB (left bundle branch block) 01/01/2017   Left bundle branch block    Leg weakness 10/07/2020   Lumbar stenosis    Meralgia paraesthetica 12/05/2016   Mild CAD 11/24/2015   Morbid obesity 10/07/2020   Neuropathy    OSA (obstructive sleep apnea) 05/24/2016   Overview:  Managed PULM   PONV (postoperative nausea and vomiting)    "no N/V with patch"   Restless leg syndrome    S/P lumbar  laminectomy 11/26/2015   S/P lumbar spinal fusion 08/29/2016   Tinnitus 12/05/2016   Type 2 diabetes mellitus    UTI (urinary tract infection) 10/07/2020    Past Surgical History:  Procedure Laterality Date   ABDOMINAL HYSTERECTOMY  1983   APPENDECTOMY  Age 5   [place] 2012 AND FUSION WITH INSTRUMENTATION SEPT 2012   CARDIAC CATHETERIZATION     x 2   CARPAL TUNNEL RELEASE     bil   CHOLECYSTECTOMY  1990's   COLONOSCOPY  12/12/2017   Colonic polyp status post polypectomy. Mild sigmoid diverticulosis. Otherwise normal colonoscopy to terminal ileum.   CYSTO EXTRACTION KIDNEY STONES     ESOPHAGOGASTRODUODENOSCOPY  12/12/2017   Small hiatal hernia. Mild gastritis. Status post esophageal dilatation.   EXCISION/RELEASE BURSA HIP  02/02/2012   Procedure: EXCISION/RELEASE BURSA HIP;  Surgeon: Tobi Bastos, MD;  Location: [facility];  Service: Orthopedics;  Laterality: Right;  Right Hip Bursectomy   EYE SURGERY Bilateral  cataracts   KIDNEY STONE SURGERY  2008   Left knee surgery x 2  1996   reconstruction   LUMBAR DISC SURGERY  08/2016   LUMBAR LAMINECTOMY/DECOMPRESSION MICRODISCECTOMY Right 11/26/2015   Procedure: Extraforaminal Microdiscectomy  - Lumbar two-three- right;  Surgeon: Eustace Moore, MD;  Location: [facility];  Service: Neurosurgery;  Laterality: Right;  right    LUMBAR WOUND DEBRIDEMENT N/A 10/25/2016   Procedure: Lumbar wound revision;  Surgeon: Eustace Moore, MD;  Location: [facility];  Service: Neurosurgery;  Laterality: N/A;  Lumbar wound revision   Right shoulder surgery  2010   spur   RIGHT/LEFT HEART CATH AND CORONARY ANGIOGRAPHY N/A 10/05/2020   Procedure: RIGHT/LEFT HEART CATH AND CORONARY ANGIOGRAPHY;  Surgeon: Martinique, Peter M, MD;  Location: [facility];  Service: Cardiovascular;  Laterality: N/A;    Current Medications: Current Meds  Medication Sig   allopurinol (ZYLOPRIM) 100 MG tablet Take 100 mg by  mouth at bedtime.   aspirin EC 81 MG tablet Take 81 mg by mouth daily. **presently on hold for surgery**   carvedilol (COREG) 6.25 MG tablet Take 1 tablet (6.25 mg total) by mouth 2 (two) times daily. Take 6.25 mg by mouth 2 (two) times daily. / Needs appointment for further refills   ceFEPime (MAXIPIME) IVPB Inject 2 g into the vein every 12 (twelve) hours. Indication:  Discitis/osteomyelitis  First Dose: Yes Last Day of Therapy:  01/17/23  Labs - Once weekly:  CBC/D and BMP, Labs - Every other week:  ESR and CRP Method of administration: IV Push Method of administration may be changed at the discretion of home infusion pharmacist based upon assessment of the patient and/or caregiver's ability to self-administer the medication ordered.   cyclobenzaprine (FLEXERIL) 5 MG tablet Take 5 mg by mouth 3 (three) times daily as needed for muscle spasms.   DULoxetine (CYMBALTA) 60 MG capsule Take 60 mg by mouth at bedtime.    HYDROcodone-acetaminophen (NORCO/VICODIN) 5-325 MG tablet Take 1 tablet by mouth every 6 (six) hours as needed for moderate pain.   metFORMIN (GLUCOPHAGE-XR) 500 MG 24 hr tablet Take 500 mg by mouth 2 (two) times daily.   mirtazapine (REMERON) 15 MG tablet Take 15 mg by mouth at bedtime.   MOUNJARO 5 MG/0.5ML Pen Inject 5 mg into the skin once a week.   Multiple Vitamin (MULTIVITAMIN WITH MINERALS) TABS tablet Take 1 tablet by mouth daily.   Multiple Vitamins-Minerals (HAIR SKIN AND NAILS FORMULA) TABS Take 1 tablet by mouth daily.   nitroGLYCERIN (NITROSTAT) 0.4 MG SL tablet Place 0.4 mg under the tongue every 5 (five) minutes as needed for chest pain.    NOVOLOG FLEXPEN 100 UNIT/ML FlexPen Inject 55 Units into the skin 3 (three) times daily with meals as needed for high blood sugar. As needed for blood sugar over 150. Add 1 unit for every point over 150   pantoprazole (PROTONIX) 40 MG tablet Take 1 tablet by mouth once daily   pramipexole (MIRAPEX) 1 MG tablet Take 1 mg by mouth at  bedtime.   pregabalin (LYRICA) 75 MG capsule Take 75 mg by mouth at bedtime.   torsemide (DEMADEX) 20 MG tablet Take one tablet by mouth once daily on Sunday, Tuesday, Thursday, and Saturday. Then take one tablet by mouth twice daily on Monday, Wednesday, and Friday. (Patient taking differently: Take 20 mg by mouth daily.)   TRESIBA FLEXTOUCH 200 UNIT/ML FlexTouch Pen Inject 110 Units into the skin at bedtime.  valACYclovir (VALTREX) 1000 MG tablet Take 1,000 mg by mouth daily as needed (fever blisters).   Current Facility-Administered Medications for the 01/08/23 encounter (Office Visit) with Richardo Priest, MD  Medication   triamcinolone acetonide (KENALOG) 10 MG/ML injection 10 mg   triamcinolone acetonide (KENALOG) 10 MG/ML injection 10 mg     Allergies:   Atorvastatin, Talwin [pentazocine], and Other   Social History   Socioeconomic History   Marital status: Married    Spouse name: Not on file   Number of children: Not on file   Years of education: Not on file   Highest education level: Not on file  Occupational History   Occupation: Archivist  Tobacco Use   Smoking status: Never   Smokeless tobacco: Never   Tobacco comments:    Prior secondhand smoke  Vaping Use   Vaping Use: Never used  Substance and Sexual Activity   Alcohol use: Yes    Alcohol/week: 0.0 standard drinks of alcohol    Comment: Rarely   Drug use: No   Sexual activity: Not on file  Other Topics Concern   Not on file  Social History Narrative   Not on file   Social Determinants of Health   Financial Resource Strain: Not on file  Food Insecurity: Not on file  Transportation Needs: Not on file  Physical Activity: Not on file  Stress: Not on file  Social Connections: Not on file     Family History: The patient's family history includes Asthma in her sister; Bone cancer in her sister; Heart failure in her mother; Hypertension in her father; Lung cancer in her father; Stroke in  her father. ROS:   Please see the history of present illness.    All other systems reviewed and are negative.  EKGs/Labs/Other Studies Reviewed:    The following studies were reviewed today:  Cardiac Studies & Procedures   CARDIAC CATHETERIZATION  CARDIAC CATHETERIZATION 10/06/2020  Narrative  Prox LAD lesion is 30% stenosed.  1st Diag lesion is 55% stenosed.  Dist Cx lesion is 40% stenosed.  Prox RCA lesion is 25% stenosed.  Dist RCA lesion is 25% stenosed.  There is mild left ventricular systolic dysfunction.  LV end diastolic pressure is moderately elevated.  The left ventricular ejection fraction is 50-55% by visual estimate.  Hemodynamic findings consistent with mild pulmonary hypertension.  1. Nonobstructive CAD. Codominant circulation 2. Low normal LV systolic function. EF estimated at 50%. 3. Elevated LV filling pressures - PCWP 22 mm Hg, EDP 25 mm Hg 4. Mild pulmonary HTN with mean PAP 27 mm Hg 5. Elevated RA pressures 16 mm Hg 6. Reduced cardiac output with index 2.5  Plan: medical management for CAD. Need to repeat Echo to assess LV and RV function. May need to consider pulmonary embolic disease given pulmonary HTN and elevated RA pressures. This may reflect elevated Left heart pressures. Morbid obesity and OSA may also be contributing.  Findings Coronary Findings Diagnostic  Dominance: Co-dominant  Left Main Vessel was injected. Vessel is normal in caliber. Vessel is angiographically normal.  Left Anterior Descending Prox LAD lesion is 30% stenosed.  First Diagonal Branch 1st Diag lesion is 55% stenosed.  Left Circumflex Dist Cx lesion is 40% stenosed.  Right Coronary Artery Prox RCA lesion is 25% stenosed. Dist RCA lesion is 25% stenosed.  Intervention  No interventions have been documented.   STRESS TESTS  MYOCARDIAL PERFUSION IMAGING 04/16/2019  Narrative  The left ventricular ejection fraction is normal (55-65%).  Nuclear stress  EF: 56%.  There was no ST segment deviation noted during stress.  The study is normal.  This is a low risk study.   ECHOCARDIOGRAM  ECHOCARDIOGRAM COMPLETE 10/06/2020  Narrative ECHOCARDIOGRAM REPORT    Patient Name:   Katelyn [name] Date of Exam: 10/06/2020 Medical Rec #:  [id number]   Height:       62.0 in Accession #:    [id number]  Weight:       222.4 lb Date of Birth:  01/04/1956   BSA:          2.000 m Patient Age:    66 years    BP:           145/73 mmHg Patient Gender: F           HR:           77 bpm. Exam Location:  Inpatient  Procedure: 2D Echo, 3D Echo, Color Doppler, Cardiac Doppler and Strain Analysis  Indications:    Pulmonary hypertension 416.8 / I27.2  History:        Patient has prior history of Echocardiogram examinations, most recent 04/28/2019. CAD, Arrythmias:LBBB; Risk Factors:Sleep Apnea, Hypertension, Diabetes and Dyslipidemia. GERD.  Sonographer:    Darlina Sicilian RDCS Referring Phys: [id number] PETER M Martinique  IMPRESSIONS   1. Left ventricular ejection fraction, by estimation, is 55 to 60%. The left ventricle has normal function. The left ventricle has no regional wall motion abnormalities. There is mild left ventricular hypertrophy. Left ventricular diastolic parameters are indeterminate. 2. Right ventricular systolic function is normal. The right ventricular size is normal. Tricuspid regurgitation signal is inadequate for assessing PA pressure. 3. The mitral valve is normal in structure. No evidence of mitral valve regurgitation. No evidence of mitral stenosis. 4. The aortic valve is tricuspid. Aortic valve regurgitation is not visualized. No aortic stenosis is present.  FINDINGS Left Ventricle: Left ventricular ejection fraction, by estimation, is 55 to 60%. The left ventricle has normal function. The left ventricle has no regional wall motion abnormalities. The left ventricular internal cavity size was normal in size. There is mild left ventricular  hypertrophy. Left ventricular diastolic parameters are indeterminate.  Right Ventricle: The right ventricular size is normal. No increase in right ventricular wall thickness. Right ventricular systolic function is normal. Tricuspid regurgitation signal is inadequate for assessing PA pressure. The tricuspid regurgitant velocity is 1.34 m/s, and with an assumed right atrial pressure of 3 mmHg, the estimated right ventricular systolic pressure is [id number] mmHg.  Left Atrium: Left atrial size was normal in size.  Right Atrium: Right atrial size was normal in size.  Pericardium: There is no evidence of pericardial effusion.  Mitral Valve: The mitral valve is normal in structure. No evidence of mitral valve regurgitation. No evidence of mitral valve stenosis.  Tricuspid Valve: The tricuspid valve is normal in structure. Tricuspid valve regurgitation is trivial.  Aortic Valve: The aortic valve is tricuspid. Aortic valve regurgitation is not visualized. No aortic stenosis is present.  Pulmonic Valve: The pulmonic valve was not well visualized. Pulmonic valve regurgitation is not visualized.  Aorta: The aortic root and ascending aorta are structurally normal, with no evidence of dilitation.  IAS/Shunts: The interatrial septum was not well visualized.   LEFT VENTRICLE PLAX 2D LVIDd:         4.80 cm      Diastology LVIDs:         3.20 cm      LV e' medial:  6.65 cm/s LV PW:         0.90 cm      LV E/e' medial:  14.7 LV IVS:        1.00 cm      LV e' lateral:   7.38 cm/s LVOT diam:     2.00 cm      LV E/e' lateral: 13.2 LV SV:         72 LV SV Index:   36 LVOT Area:     3.14 cm  3D Volume EF: LV Volumes (MOD)            3D EF:        58 % LV vol d, MOD A2C: 93.1 ml  LV EDV:       174 ml LV vol d, MOD A4C: 120.0 ml LV ESV:       73 ml LV vol s, MOD A2C: 33.6 ml  LV SV:        101 ml LV vol s, MOD A4C: 49.0 ml LV SV MOD A2C:     59.5 ml LV SV MOD A4C:     120.0 ml LV SV MOD BP:      64.5  ml  RIGHT VENTRICLE RV S prime:     13.50 cm/s TAPSE (M-mode): 3.0 cm  LEFT ATRIUM             Index       RIGHT ATRIUM          Index LA diam:        4.10 cm 2.05 cm/m  RA Area:     9.25 cm LA Vol (A2C):   47.0 ml 23.50 ml/m RA Volume:   14.40 ml 7.20 ml/m LA Vol (A4C):   50.8 ml 25.40 ml/m LA Biplane Vol: 51.1 ml 25.55 ml/m AORTIC VALVE LVOT Vmax:   112.00 cm/s LVOT Vmean:  79.200 cm/s LVOT VTI:    0.229 m  AORTA Ao Root diam: 2.70 cm Ao Asc diam:  2.90 cm  MITRAL VALVE               TRICUSPID VALVE MV Area (PHT): 4.68 cm    TR Peak grad:   7.2 mmHg MV Decel Time: 162 msec    TR Vmax:        134.00 cm/s MV E velocity: 97.70 cm/s MV A velocity: 75.80 cm/s  SHUNTS MV E/A ratio:  1.29        Systemic VTI:  0.23 m Systemic Diam: 2.00 cm  Oswaldo Milian MD Electronically signed by Oswaldo Milian MD Signature Date/Time: 10/06/2020/5:17:16 PM    Final              Recent Labs: 12/06/2022: ALT 25; B Natriuretic Peptide 53.0 12/10/2022: BUN 18; Creatinine, Ser 1.07; Hemoglobin 10.3; Magnesium 1.8; Platelets 142; Potassium 3.6; Sodium 136  Recent Lipid Panel    Component Value Date/Time   CHOL 178 11/16/2021 1141   TRIG 132 11/16/2021 1141   HDL 51 11/16/2021 1141   CHOLHDL 3.5 11/16/2021 1141   CHOLHDL 4.2 10/05/2020 0228   VLDL 26 10/05/2020 0228   LDLCALC 104 (H) 11/16/2021 1141    Physical Exam:    VS:  BP (!) 150/78 (BP Location: Left Arm, Patient Position: Sitting)   Pulse 82   Ht 5\' 2"  (1.575 m)   Wt 195 lb 9.6 oz (88.7 kg)   SpO2 94%   BMI 35.78 kg/m     Wt Readings from  Last 3 Encounters:  01/08/23 195 lb 9.6 oz (88.7 kg)  12/26/22 200 lb 6.4 oz (90.9 kg)  12/05/22 207 lb 14.3 oz (94.3 kg)     GEN:  Well nourished, well developed in no acute distress HEENT: Normal NECK: No JVD; No carotid bruits LYMPHATICS: No lymphadenopathy CARDIAC: RRR, no murmurs, rubs, gallops RESPIRATORY:  Clear to auscultation without rales, wheezing  or rhonchi  ABDOMEN: Soft, non-tender, non-distended MUSCULOSKELETAL: She has 4+ bilateral lower extremity 14 8 pitting edema; No deformity  SKIN: Warm and dry NEUROLOGIC:  Alert and oriented x 3 PSYCHIATRIC:  Normal affect    Signed, Shirlee More, MD  01/08/2023 3:57 PM    [facility]

## 2023-01-08 ENCOUNTER — Encounter: Payer: Self-pay | Admitting: Cardiology

## 2023-01-08 ENCOUNTER — Ambulatory Visit: Payer: PPO | Attending: Cardiology | Admitting: Cardiology

## 2023-01-08 VITALS — BP 150/78 | HR 82 | Ht 62.0 in | Wt 195.6 lb

## 2023-01-08 DIAGNOSIS — G4733 Obstructive sleep apnea (adult) (pediatric): Secondary | ICD-10-CM

## 2023-01-08 DIAGNOSIS — I110 Hypertensive heart disease with heart failure: Secondary | ICD-10-CM

## 2023-01-08 DIAGNOSIS — I447 Left bundle-branch block, unspecified: Secondary | ICD-10-CM

## 2023-01-08 DIAGNOSIS — I2510 Atherosclerotic heart disease of native coronary artery without angina pectoris: Secondary | ICD-10-CM | POA: Diagnosis not present

## 2023-01-08 MED ORDER — TORSEMIDE 20 MG PO TABS: 20.0000 mg | ORAL_TABLET | Freq: Two times a day (BID) | ORAL | 3 refills | Status: DC

## 2023-01-08 NOTE — Telephone Encounter (Signed)
Labs will be drawn today for follow up - Danella Maiers!

## 2023-01-08 NOTE — Patient Instructions (Signed)
Medication Instructions:  Your physician has recommended you make the following change in your medication:   START: Torsemide 20 mg twice daily  *If you need a refill on your cardiac medications before your next appointment, please call your pharmacy*   Lab Work: None If you have labs (blood work) drawn today and your tests are completely normal, you will receive your results only by: B and E (if you have MyChart) OR A paper copy in the mail If you have any lab test that is abnormal or we need to change your treatment, we will call you to review the results.   Testing/Procedures: None   Follow-Up: At [facility], you and your health needs are our priority.  As part of our continuing mission to provide you with exceptional heart care, we have created designated Provider Care Teams.  These Care Teams include your primary Cardiologist (physician) and Advanced Practice Providers (APPs -  Physician Assistants and Nurse Practitioners) who all work together to provide you with the care you need, when you need it.  We recommend signing up for the patient portal called "MyChart".  Sign up information is provided on this After Visit Summary.  MyChart is used to connect with patients for Virtual Visits (Telemedicine).  Patients are able to view lab/test results, encounter notes, upcoming appointments, etc.  Non-urgent messages can be sent to your provider as well.   To learn more about what you can do with MyChart, go to [email or web address].    Your next appointment:   3 month(s)  Provider:   Venia Carbon, NP [facility])    Other Instructions None

## 2023-01-10 NOTE — Telephone Encounter (Signed)
SCr continues to get worse. Monday 4/1 labs showed SCr of 3.47, VT now down to 15.1. I will ask them to stop the vancomycin all together but continue the cefepime (dose adjusted) until her end date next week. I will also see if they can do 1L of fluids to help flush them out.  I assume she will continue to have vanc on board for several days due to delayed clearing.

## 2023-01-16 ENCOUNTER — Telehealth: Payer: Self-pay

## 2023-01-16 ENCOUNTER — Encounter: Payer: Self-pay | Admitting: Internal Medicine

## 2023-01-16 NOTE — Telephone Encounter (Signed)
I hate to hear that!

## 2023-01-16 NOTE — Telephone Encounter (Signed)
Received call from patient's husband, he reports Katelyn [name] is currently at [facility] due to elevated kidney function and now has fluid retention.   We have canceled her appointment for tomorrow, asked them to please call for an appointment when she is discharged.   Sandie Ano, RN

## 2023-01-17 ENCOUNTER — Ambulatory Visit: Payer: PPO | Admitting: Internal Medicine

## 2023-01-17 DIAGNOSIS — I361 Nonrheumatic tricuspid (valve) insufficiency: Secondary | ICD-10-CM

## 2023-01-18 DIAGNOSIS — I2489 Other forms of acute ischemic heart disease: Secondary | ICD-10-CM

## 2023-01-18 DIAGNOSIS — D649 Anemia, unspecified: Secondary | ICD-10-CM

## 2023-01-18 DIAGNOSIS — I2510 Atherosclerotic heart disease of native coronary artery without angina pectoris: Secondary | ICD-10-CM

## 2023-01-18 DIAGNOSIS — I1310 Hypertensive heart and chronic kidney disease without heart failure, with stage 1 through stage 4 chronic kidney disease, or unspecified chronic kidney disease: Secondary | ICD-10-CM

## 2023-01-18 DIAGNOSIS — I5030 Unspecified diastolic (congestive) heart failure: Secondary | ICD-10-CM

## 2023-01-18 DIAGNOSIS — I447 Left bundle-branch block, unspecified: Secondary | ICD-10-CM

## 2023-01-19 DIAGNOSIS — I1310 Hypertensive heart and chronic kidney disease without heart failure, with stage 1 through stage 4 chronic kidney disease, or unspecified chronic kidney disease: Secondary | ICD-10-CM | POA: Diagnosis not present

## 2023-01-19 DIAGNOSIS — D649 Anemia, unspecified: Secondary | ICD-10-CM | POA: Diagnosis not present

## 2023-01-19 DIAGNOSIS — I5030 Unspecified diastolic (congestive) heart failure: Secondary | ICD-10-CM | POA: Diagnosis not present

## 2023-01-19 DIAGNOSIS — I2489 Other forms of acute ischemic heart disease: Secondary | ICD-10-CM | POA: Diagnosis not present

## 2023-01-20 DIAGNOSIS — I509 Heart failure, unspecified: Secondary | ICD-10-CM

## 2023-01-20 DIAGNOSIS — N189 Chronic kidney disease, unspecified: Secondary | ICD-10-CM

## 2023-01-20 DIAGNOSIS — D649 Anemia, unspecified: Secondary | ICD-10-CM

## 2023-01-22 ENCOUNTER — Encounter: Payer: Self-pay | Admitting: Internal Medicine

## 2023-01-26 ENCOUNTER — Telehealth: Payer: Self-pay | Admitting: Cardiology

## 2023-01-26 NOTE — Telephone Encounter (Signed)
Pt is requesting her lab results for her kidney and liver to be faxed to Valetta Close FNP with [facility] [place]. Pt is requesting this due to her physician wanting to take her off of hydrocodone and replace of oxycodone due to the asprin in hydrocodone.   Valetta Close FNP [phone] (Work) [phone] (Fax) [address] [place], [place] [address] Pain Medicine

## 2023-01-26 NOTE — Telephone Encounter (Signed)
Lab results faxed as requested, LM notifying the patient.

## 2023-02-05 ENCOUNTER — Ambulatory Visit: Payer: PPO | Admitting: Nurse Practitioner

## 2023-02-05 ENCOUNTER — Encounter: Payer: Self-pay | Admitting: Nurse Practitioner

## 2023-02-05 VITALS — BP 128/64 | HR 65 | Temp 97.9°F | Ht 62.0 in | Wt 189.4 lb

## 2023-02-05 DIAGNOSIS — R0609 Other forms of dyspnea: Secondary | ICD-10-CM

## 2023-02-05 DIAGNOSIS — M869 Osteomyelitis, unspecified: Secondary | ICD-10-CM | POA: Diagnosis not present

## 2023-02-05 DIAGNOSIS — J9611 Chronic respiratory failure with hypoxia: Secondary | ICD-10-CM

## 2023-02-05 DIAGNOSIS — G4733 Obstructive sleep apnea (adult) (pediatric): Secondary | ICD-10-CM

## 2023-02-05 MED ORDER — ALBUTEROL SULFATE HFA 108 (90 BASE) MCG/ACT IN AERS: 2.0000 | INHALATION_SPRAY | Freq: Four times a day (QID) | RESPIRATORY_TRACT | 2 refills | Status: DC | PRN

## 2023-02-05 NOTE — Patient Instructions (Signed)
Albuterol inhaler 2 puffs every 6 hours as needed for shortness of breath or wheezing  Pulmonary function testing ordered today   Split night sleep study ordered - someone will contact you to schedule this  We discussed how untreated sleep apnea puts an individual at risk for cardiac arrhthymias, pulm HTN, DM, stroke and increases their risk for daytime accidents. We also briefly reviewed treatment options including weight loss, side sleeping position, oral appliance, CPAP therapy or referral to ENT for possible surgical options  Talk with your spine surgeon about physical therapy referral   Follow up in 6-8 weeks after PFTs with Dr. Craige Cotta. If symptoms do not improve or worsen, please contact office for sooner follow up or seek emergency care.

## 2023-02-05 NOTE — Assessment & Plan Note (Signed)
OSA/OHS not on BiPAP. She had stopped wearing her BiPAP following titration study which revealed she did not need any supplemental O2 over a year ago. Since then, she has had a 40 pound weight loss using Mounjaro. She continues to have daytime sleepiness since coming off therapy. We will repeat an in lab split night study to evaluate for unresolved OSA/OHS and determine appropriate treatment options. We discussed how untreated sleep apnea puts an individual at risk for cardiac arrhthymias, pulm HTN, DM, stroke and increases their risk for daytime accidents. We also briefly reviewed treatment options including weight loss, side sleeping position, oral appliance, CPAP therapy or referral to ENT for possible surgical options. Cautioned on safe driving practices.   Patient Instructions  Albuterol inhaler 2 puffs every 6 hours as needed for shortness of breath or wheezing  Pulmonary function testing ordered today   Split night sleep study ordered - someone will contact you to schedule this  We discussed how untreated sleep apnea puts an individual at risk for cardiac arrhthymias, pulm HTN, DM, stroke and increases their risk for daytime accidents. We also briefly reviewed treatment options including weight loss, side sleeping position, oral appliance, CPAP therapy or referral to ENT for possible surgical options  Talk with your spine surgeon about physical therapy referral   Follow up in 6-8 weeks after PFTs with Dr. Craige Cotta. If symptoms do not improve or worsen, please contact office for sooner follow up or seek emergency care.

## 2023-02-05 NOTE — Assessment & Plan Note (Addendum)
Chronic DOE. Likely multifactorial related to deconditioning, obesity, possible untreated OSA and cardiac disease; although, no significant change with 40 lb weight loss. PFTs from 2017 with restriction and moderate diffusing capacity. Previous CTs without evidence of ILD and perfusion scan nl. She is a never smoker but has a history of significant secondhand smoke exposure. We discussed repeating PFTs and trial of albuterol rescue inhaler. Encouraged her to work on graded exercises and consider PT with her Writer.

## 2023-02-05 NOTE — Progress Notes (Signed)
Reviewed and agree with assessment/plan.   Coralyn Helling, MD [facility] Pulmonary/Critical Care 02/05/2023, 5:00 PM Pager:  [phone]

## 2023-02-05 NOTE — Assessment & Plan Note (Signed)
Suspected to be secondary to OHS. She did not require any supplemental O2 on previous BiPAP titration from 12/2021. She had walk test today without desaturations; SpO2 low 95% on room air.

## 2023-02-05 NOTE — Progress Notes (Signed)
@Patient  ID: Katelyn [name], female    DOB: January 25, 1956, 67 y.o.   MRN: [id number]  Chief Complaint  Patient presents with   Follow-up    Ed 3 weeks ago, pt was admitted for 9 days. Pt was seen for mini strokes. Had fluid near the heart and lungs.      Referring provider: Olive Bass, MD  HPI: 67 year old female, never smoker followed for OSA and OHS on BiPAP. She is a patient of Dr. Evlyn Courier and last seen in office 11/14/2021. Past medical history significant for HTN, CHF, LBBB, CAD, migraines, PAD, GERD, DM II, CKD, depression, anxiety.   TEST/EVENTS:  04/04/1026 PFT: FEV1 1.77 (74%), FEV1% 87, TLC 3.69 (77%), DLCO 69%, no BD 04/19/2017 A1AT: 122, MM 04/28/2019 echo: EF 60-65%, impaired relaxation. RV size and function nl. RA nl.  07/21/2019 HST: AHI 32.2/h, SpO2 low 64% 10/01/2019 BiPAP titration 22/18 cmH2O 10/06/2020 perfusion scan: no defects  10/06/2020 echo:EF 55-60%. Mild LVH. RV size and function nl. Nl PASP.  12/15/2021 BiPAP titration >> BiPAP 21/16 cmH2O, no O2 needed   11/14/2022: OV with Dr. Craige Cotta. Uses BiPAP nightly. DME company stated she needed to requalify for oxygen at night. BiPAP titration study ordered. Currently on autoset BiPAP IPAP max 25, EPAP min 16, PS 5. She complained of getting winded with activity and leg cramps. Recovered within a few minutes of rest. No wheeze or sputum. DOE likely due to diastolic CHF, peripheral artery disease and deconditioning.   02/05/2023: Today - follow up Patient presents today for hospital follow up with her husband. She had spinal cord nerve stimulator placed in December 2023. She ended up going back to the hospital and was admitted at [facility] for discitis-osteomyelitis from 12/05/2022-12/11/2022. She had PICC line placed during her stay and was discharged home on IV abx. She tells me that after she went home, she was being given vancomycin and cefepime. She was doing okay but then when her home health nurse came out, she described some facial  twitching. The home health nurse was concerned for possible TIA. She was advised to go to the ED for further evaluation so EMS was called. She was transported to [place] and found to have a kidney injury, per their report. Note is not available to review today. They tell me she was admitted for around 9 days and during her stay, had trouble with volume overload related to her heart failure as well as anemia requiring a blood transfusion. She only briefly required oxygen while she was admitted and that was during a procedure that she had to lay flat. She was discharged around 3 weeks ago on twice daily torsemide and IV abx were discontinued. She has yet to see ID for follow up. She denies any fevers, chills, night sweats. No follow up with cardiology since discharge. Leg swelling is at baseline; worse in evenings so she wears compression stockings. No orthopnea, PND, palpitations, dizziness, CP.  Today, she tells me she is feeling better today. Still fatigued and hasn't fully regained her strength back but slowly improving. She is having some pain in her back still, which she will be discussing with her surgeon this week. From a respiratory standpoint, her breathing feels about the same. She has trouble with shortness of breath, especially any sort of distance walking if she doesn't have her walker. She doesn't feel like this is any worse since her initial surgery in December. She denies any significant cough, chest congestion, wheezing. She hasn't  noticed any improvement with weight loss. She is curious about trying an inhaler. Her brother in law is a former smoker and uses Trelegy, which helps. She does not have a history of childhood asthma and is a never smoker. She was exposed to significant secondhand smoke. She has never tried an inhaler before.  Regarding her BiPAP, she has been off of therapy for over a year. After her BiPAP titration, she felt like she couldn't use the BiPAP without the oxygen because  she couldn't breathe well on the new settings so she just stopped wearing it all together. She is 40 lb down since her sleep study. She denies any witnessed apneas or loud snoring. She is tired during the day. No morning headaches, drowsy driving, sleep parasomnias/paralysis. She's curious if she still needs to wear the BiPAP at night.    Allergies  Allergen Reactions   Atorvastatin Nausea And Vomiting and Other (See Comments)    MYALGIAS   Talwin [Pentazocine] Other (See Comments)    headache   Other Nausea Only    UNSPECIFIED Anesthesia    Immunization History  Administered Date(s) Administered   Influenza Split 04/20/2014   Influenza, Quadrivalent, Recombinant, Inj, Pf 09/14/2020   Influenza,inj,Quad PF,6+ Mos 07/01/2019   Influenza-Unspecified 07/01/2019   PFIZER Comirnaty(Gray Top)Covid-19 Tri-Sucrose Vaccine 10/23/2020, 11/15/2020   PFIZER(Purple Top)SARS-COV-2 Vaccination 10/23/2020, 11/15/2020   Pneumococcal Polysaccharide-23 08/23/2018   Pneumococcal-Unspecified 03/31/2022   Tdap 07/29/2010, 04/20/2014, 03/31/2022   Zoster Recombinat (Shingrix) 08/03/2021, 11/15/2021   Zoster, Live 04/20/2014    Past Medical History:  Diagnosis Date   Anxiety    Arthritis    Bursitis of right hip    CAD (coronary artery disease)    Cardiac catheterization June 2014 in [place] - 50% circumflex stenosis   Chest pain, neg MI, stable CAD non obstructive on cath 10/05/20 10/04/2020   Chronic diastolic heart failure (HCC) 08/20/2017   Chronic kidney disease, stage 3 (HCC)    does not see nephrologist   Chronic low back pain without sciatica 03/14/2016   CKD (chronic kidney disease), stage III (HCC) 10/07/2020   Complication of anesthesia    Cough 04/19/2017   Overview:  Last Assessment & Plan:  Formatting of this note may be different from the original. Cough - ? ACE related with AR triggers   Plan  Patient Instructions  Discuss with your primary doctor that lisinopril pain, need making  your cough worse. May use Mucinex DM twice daily as needed for cough and congestion Zyrtec 10 mg at bedtime as needed for drainage Saline nasal spray as needed. Lab tests today Activity as tolerated. Follow with Dr. Craige Cotta in 3-4 months and As needed   Please contact office for sooner follow up if symptoms do not improve or worsen or seek emergency care    Depression    Dyspnea    with exertion   " lazy lung" - per  Dr Craige Cotta from back issues- 06/2016   Elevated liver enzymes 12/05/2016   Essential hypertension    GERD (gastroesophageal reflux disease)    Gout 03/14/2016   Greater trochanteric bursitis of right hip 02/02/2012   H/O hiatal hernia    Heart murmur    History of blood transfusion 2016   History of esophageal stricture 10/07/2020   History of kidney stones    Hypercholesterolemia    Hypertensive heart disease with heart failure (HCC) 01/01/2017   Hypoxia 10/07/2020   Iliotibial band syndrome of right side 02/02/2012   Iron  deficiency anemia due to chronic blood loss 03/14/2016   LBBB (left bundle branch block) 01/01/2017   Left bundle branch block    Leg weakness 10/07/2020   Lumbar stenosis    Meralgia paraesthetica 12/05/2016   Mild CAD 11/24/2015   Morbid obesity (HCC) 10/07/2020   Neuropathy    OSA (obstructive sleep apnea) 05/24/2016   Overview:  Managed PULM   PONV (postoperative nausea and vomiting)    "no N/V with patch"   Restless leg syndrome    S/P lumbar laminectomy 11/26/2015   S/P lumbar spinal fusion 08/29/2016   Tinnitus 12/05/2016   Type 2 diabetes mellitus (HCC)    UTI (urinary tract infection) 10/07/2020    Tobacco History: Social History   Tobacco Use  Smoking Status Never  Smokeless Tobacco Never  Tobacco Comments   Prior secondhand smoke   Counseling given: Not Answered Tobacco comments: Prior secondhand smoke   Outpatient Medications Prior to Visit  Medication Sig Dispense Refill   allopurinol (ZYLOPRIM) 100 MG tablet Take 100 mg by mouth at  bedtime.     aspirin EC 81 MG tablet Take 81 mg by mouth daily. **presently on hold for surgery**     carvedilol (COREG) 6.25 MG tablet Take 1 tablet (6.25 mg total) by mouth 2 (two) times daily. Take 6.25 mg by mouth 2 (two) times daily. / Needs appointment for further refills 60 tablet 0   cyclobenzaprine (FLEXERIL) 5 MG tablet Take 5 mg by mouth 3 (three) times daily as needed for muscle spasms.     DULoxetine (CYMBALTA) 60 MG capsule Take 60 mg by mouth at bedtime.      HYDROcodone-acetaminophen (NORCO/VICODIN) 5-325 MG tablet Take 1 tablet by mouth every 6 (six) hours as needed for moderate pain. 20 tablet 0   metFORMIN (GLUCOPHAGE-XR) 500 MG 24 hr tablet Take 500 mg by mouth 2 (two) times daily.     mirtazapine (REMERON) 15 MG tablet Take 15 mg by mouth at bedtime.     MOUNJARO 5 MG/0.5ML Pen Inject 5 mg into the skin once a week.     Multiple Vitamin (MULTIVITAMIN WITH MINERALS) TABS tablet Take 1 tablet by mouth daily.     Multiple Vitamins-Minerals (HAIR SKIN AND NAILS FORMULA) TABS Take 1 tablet by mouth daily.     nitroGLYCERIN (NITROSTAT) 0.4 MG SL tablet Place 0.4 mg under the tongue every 5 (five) minutes as needed for chest pain.      NOVOLOG FLEXPEN 100 UNIT/ML FlexPen Inject 55 Units into the skin 3 (three) times daily with meals as needed for high blood sugar. As needed for blood sugar over 150. Add 1 unit for every point over 150     pantoprazole (PROTONIX) 40 MG tablet Take 1 tablet by mouth once daily 90 tablet 2   pramipexole (MIRAPEX) 1 MG tablet Take 1 mg by mouth at bedtime.     pregabalin (LYRICA) 75 MG capsule Take 75 mg by mouth at bedtime.     torsemide (DEMADEX) 20 MG tablet Take 1 tablet (20 mg total) by mouth 2 (two) times daily. 180 tablet 3   TRESIBA FLEXTOUCH 200 UNIT/ML FlexTouch Pen Inject 110 Units into the skin at bedtime.     valACYclovir (VALTREX) 1000 MG tablet Take 1,000 mg by mouth daily as needed (fever blisters).     Facility-Administered Medications  Prior to Visit  Medication Dose Route Frequency Provider Last Rate Last Admin   triamcinolone acetonide (KENALOG) 10 MG/ML injection 10 mg  10  mg Other Once Asencion Islam, DPM       triamcinolone acetonide (KENALOG) 10 MG/ML injection 10 mg  10 mg Other Once Asencion Islam, DPM         Review of Systems:   Constitutional: No night sweats, fevers, chills. +fatigue, intentional weight loss  HEENT: No headaches, difficulty swallowing, tooth/dental problems, or sore throat. No sneezing, itching, ear ache, nasal congestion, or post nasal drip CV:  + swelling in lower extremities (Baseline) No chest pain, orthopnea, PND, anasarca, dizziness, palpitations, syncope Resp: + shortness of breath with exertion. No excess mucus or change in color of mucus. No productive or non-productive. No hemoptysis. No wheezing.  No chest wall deformity GI:  No heartburn, indigestion, abdominal pain, nausea, vomiting, diarrhea, change in bowel habits, loss of appetite, bloody stools.  GU: No dysuria, change in color of urine, urgency or frequency.  No flank pain, no hematuria  Skin: No rash, lesions, ulcerations MSK:  No joint pain or swelling.  +back pain Neuro: No dizziness or lightheadedness.  Psych: No depression or anxiety. Mood stable.     Physical Exam:  BP 128/64   Pulse 65   Temp 97.9 F (36.6 C) (Oral)   Ht 5\' 2"  (1.575 m)   Wt 189 lb 6.4 oz (85.9 kg)   SpO2 91%   BMI 34.64 kg/m   GEN: Pleasant, interactive, well-kempt; obese; in no acute distress. HEENT:  Normocephalic and atraumatic. PERRLA. Sclera white. Nasal turbinates pink, moist and patent bilaterally. No rhinorrhea present. Oropharynx pink and moist, without exudate or edema. No lesions, ulcerations, or postnasal drip.  NECK:  Supple w/ fair ROM. No JVD present. Normal carotid impulses w/o bruits. Thyroid symmetrical with no goiter or nodules palpated. No lymphadenopathy.   CV: RRR, no m/r/g, no peripheral edema. Pulses intact, +2  bilaterally. No cyanosis, pallor or clubbing. PULMONARY:  Unlabored, regular breathing. Clear bilaterally A&P w/o wheezes/rales/rhonchi. No accessory muscle use.  GI: BS present and normoactive. Soft, non-tender to palpation. No organomegaly or masses detected.  MSK: No erythema, warmth or tenderness. Cap refil <2 sec all extrem. No deformities or joint swelling noted.  Neuro: A/Ox3. No focal deficits noted.   Skin: Warm, no lesions or rashe Psych: Normal affect and behavior. Judgement and thought content appropriate.     Lab Results:  CBC    Component Value Date/Time   WBC 6.3 12/10/2022 0746   RBC 3.41 (L) 12/10/2022 0746   HGB 10.3 (L) 12/10/2022 0746   HGB 13.2 06/11/2019 0924   HCT 31.6 (L) 12/10/2022 0746   HCT 38.8 06/11/2019 0924   PLT 142 (L) 12/10/2022 0746   PLT 234 06/11/2019 0924   MCV 92.7 12/10/2022 0746   MCV 101 (H) 06/11/2019 0924   MCH 30.2 12/10/2022 0746   MCHC 32.6 12/10/2022 0746   RDW 14.4 12/10/2022 0746   RDW 13.2 06/11/2019 0924   LYMPHSABS 1.4 12/06/2022 0440   MONOABS 0.8 12/06/2022 0440   EOSABS 0.3 12/06/2022 0440   BASOSABS 0.0 12/06/2022 0440    BMET    Component Value Date/Time   NA 136 12/10/2022 0746   NA 140 11/16/2021 1141   K 3.6 12/10/2022 0746   CL 98 12/10/2022 0746   CO2 31 12/10/2022 0746   GLUCOSE 136 (H) 12/10/2022 0746   BUN 18 12/10/2022 0746   BUN 19 11/16/2021 1141   CREATININE 1.07 (H) 12/10/2022 0746   CALCIUM 8.7 (L) 12/10/2022 0746   GFRNONAA 58 (L) 12/10/2022 0746  GFRAA 62 10/22/2020 1442    BNP    Component Value Date/Time   BNP 53.0 12/06/2022 0440     Imaging:  No results found.        No data to display          No results found for: "NITRICOXIDE"      Assessment & Plan:   OSA (obstructive sleep apnea) OSA/OHS not on BiPAP. She had stopped wearing her BiPAP following titration study which revealed she did not need any supplemental O2 over a year ago. Since then, she has had a  40 pound weight loss using Mounjaro. She continues to have daytime sleepiness since coming off therapy. We will repeat an in lab split night study to evaluate for unresolved OSA/OHS and determine appropriate treatment options. We discussed how untreated sleep apnea puts an individual at risk for cardiac arrhthymias, pulm HTN, DM, stroke and increases their risk for daytime accidents. We also briefly reviewed treatment options including weight loss, side sleeping position, oral appliance, CPAP therapy or referral to ENT for possible surgical options. Cautioned on safe driving practices.   Patient Instructions  Albuterol inhaler 2 puffs every 6 hours as needed for shortness of breath or wheezing  Pulmonary function testing ordered today   Split night sleep study ordered - someone will contact you to schedule this  We discussed how untreated sleep apnea puts an individual at risk for cardiac arrhthymias, pulm HTN, DM, stroke and increases their risk for daytime accidents. We also briefly reviewed treatment options including weight loss, side sleeping position, oral appliance, CPAP therapy or referral to ENT for possible surgical options  Talk with your spine surgeon about physical therapy referral   Follow up in 6-8 weeks after PFTs with Dr. Craige Cotta. If symptoms do not improve or worsen, please contact office for sooner follow up or seek emergency care.    Chronic hypoxemic respiratory failure (HCC) Suspected to be secondary to OHS. She did not require any supplemental O2 on previous BiPAP titration from 12/2021. She had walk test today without desaturations; SpO2 low 95% on room air.   Dyspnea Chronic DOE. Likely multifactorial related to deconditioning, obesity, possible untreated OSA and cardiac disease; although, no significant change with 40 lb weight loss. PFTs from 2017 with restriction and moderate diffusing capacity. Previous CTs without evidence of ILD and perfusion scan nl. She is a never  smoker but has a history of significant secondhand smoke exposure. We discussed repeating PFTs and trial of albuterol rescue inhaler. Encouraged her to work on graded exercises and consider PT with her Writer.   Osteomyelitis [facility]) Following spinal cord stimulator December 2023. She is currently off all antimicrobial therapies. No infectious symptoms. She is to reschedule her follow up with ID. She has follow up with her spine surgeon this week.    I spent 45 minutes of dedicated to the care of this patient on the date of this encounter to include pre-visit review of records, face-to-face time with the patient discussing conditions above, post visit ordering of testing, clinical documentation with the electronic health record, making appropriate referrals as documented, and communicating necessary findings to members of the patients care team.  Noemi Chapel, NP 02/05/2023  Pt aware and understands NP's role.

## 2023-02-05 NOTE — Assessment & Plan Note (Addendum)
Following spinal cord stimulator December 2023. She is currently off all antimicrobial therapies. No infectious symptoms. She is to reschedule her follow up with ID. She has follow up with her spine surgeon this week.

## 2023-02-08 ENCOUNTER — Other Ambulatory Visit: Payer: Self-pay

## 2023-02-08 ENCOUNTER — Encounter: Payer: Self-pay | Admitting: Internal Medicine

## 2023-02-08 ENCOUNTER — Ambulatory Visit (INDEPENDENT_AMBULATORY_CARE_PROVIDER_SITE_OTHER): Payer: PPO | Admitting: Internal Medicine

## 2023-02-08 VITALS — BP 102/65 | HR 79 | Temp 97.9°F | Ht 62.0 in | Wt 188.0 lb

## 2023-02-08 DIAGNOSIS — M4625 Osteomyelitis of vertebra, thoracolumbar region: Secondary | ICD-10-CM

## 2023-02-08 DIAGNOSIS — G062 Extradural and subdural abscess, unspecified: Secondary | ICD-10-CM | POA: Diagnosis not present

## 2023-02-08 DIAGNOSIS — M4645 Discitis, unspecified, thoracolumbar region: Secondary | ICD-10-CM

## 2023-02-08 DIAGNOSIS — T85733D Infection and inflammatory reaction due to implanted electronic neurostimulator of spinal cord, electrode (lead), subsequent encounter: Secondary | ICD-10-CM

## 2023-02-08 LAB — CBC
MCH: 29.7 pg (ref 27.0–33.0)
Platelets: 144 10*3/uL (ref 140–400)
RBC: 3.54 10*6/uL — ABNORMAL LOW (ref 3.80–5.10)

## 2023-02-08 NOTE — Progress Notes (Addendum)
[facility]  CHIEF COMPLAINT:    Follow up for discitis/OM  SUBJECTIVE:    Katelyn [name] is a 67 y.o. female with PMHx as below who presents to the clinic for discitis/OM.   Nykayla was admitted at [facility] from 12/05/2022 to 12/11/2022.  She was found to have T11-T12 discitis-osteomyelitis with epidural phlegmon from T8-T9 through L1-L2.  This occurred in the setting of a recent spinal stimulator placement with MRI at admission showing soft tissue swelling and enhancement surrounding the spinal cord stimulator leads in the posterior midline back.  She underwent IR guided aspiration 12/06/2022 with cultures that finalized as no growth.  Her blood cultures were also no growth.  She was seen by neurosurgery during her admission who advocated for no surgical intervention and leaving the spinal stimulator in place given that she had received some pain benefit from it and it would be unlikely that they could replace it in the future.     She also has a history of lumbar spine surgery status post lumbar instrumented fusion in November 2017.  This was complicated by wound dehiscence requiring I&D in January 2018 with cultures at that time growing Serratia marcescens.  She has had no issues with this wound during the interval from then until her current presentation.     She had a PICC line placed at discharge and was sent home on cefepime 2 g IV every 12 hours and vancomycin 1000 mg every 24 hours.  We attempted to send her home on daptomycin but this was found to be cost prohibitive for the patient.  She was seen for follow up by myself on 12/26/22 and we noted her OPAT labs had shown fluctuating creatinine and vancomycin levels.  Pharmacy worked to adjust the dosing and ultimately held her vancomycin all together.  She unfortunately required a brief hospitalization at [facility] for acute kidney injury, heart failure/volume overload but has since been discharged and is  back at home.  Her initial IV antibiotic therapy was scheduled to end about 3 weeks ago on 01/17/23 and she was scheduled for follow up at that time.  However, her appointment was missed due to her hospitalization.  All antibiotics were discontinued during that admission and PICC line removed.  She reports since that time no infectious symptoms and overall stable back pain symptoms.  She sees NSGY this week. She had a follow up MRI completed on 01/10/23 prior to completing antibiotics showing persistent discitis/OM and suspected right septic arthritis at T11-12 with thin epidural phlegmon/abscess.  Overall, the findings were not otherwise significantly changed from her findings in February imaging. Her last labs available in our system from 3/25 show creatinine 2.21, ESR 98, CRP 23.  She continues to report significant back pain.  She sees her NSGY provider on May 14 but is trying to get a sooner appointment.  She reports that during her MRI on 4/3, they were rough getting her off the table and thinks she strained a muscle at that time but it has gotten much worse over the past few days.    Please see A&P for the details of today's visit and status of the patient's medical problems.   Patient's Medications  New Prescriptions   No medications on file  Previous Medications   ALBUTEROL (VENTOLIN HFA) 108 (90 BASE) MCG/ACT INHALER    Inhale 2 puffs into the lungs every 6 (six) hours as needed for wheezing or  shortness of breath.   ALLOPURINOL (ZYLOPRIM) 100 MG TABLET    Take 100 mg by mouth at bedtime.   ASPIRIN EC 81 MG TABLET    Take 81 mg by mouth daily. **presently on hold for surgery**   CARVEDILOL (COREG) 6.25 MG TABLET    Take 1 tablet (6.25 mg total) by mouth 2 (two) times daily. Take 6.25 mg by mouth 2 (two) times daily. / Needs appointment for further refills   CYCLOBENZAPRINE (FLEXERIL) 5 MG TABLET    Take 5 mg by mouth 3 (three) times daily as needed for muscle spasms.   DOCUSATE SODIUM (DSS) 100  MG CAPS    100 mg.   DULOXETINE (CYMBALTA) 60 MG CAPSULE    Take 60 mg by mouth at bedtime.    FARXIGA 5 MG TABS TABLET    Take by mouth.   HYDROCODONE-ACETAMINOPHEN (NORCO/VICODIN) 5-325 MG TABLET    Take 1 tablet by mouth every 6 (six) hours as needed for moderate pain.   METFORMIN (GLUCOPHAGE-XR) 500 MG 24 HR TABLET    Take 500 mg by mouth 2 (two) times daily.   MIRTAZAPINE (REMERON) 15 MG TABLET    Take 30 mg by mouth at bedtime.   MOUNJARO 5 MG/0.5ML PEN    Inject 5 mg into the skin once a week.   MULTIPLE VITAMIN (MULTIVITAMIN WITH MINERALS) TABS TABLET    Take 1 tablet by mouth daily.   MULTIPLE VITAMINS-MINERALS (HAIR SKIN AND NAILS FORMULA) TABS    Take 1 tablet by mouth daily.   NITROGLYCERIN (NITROSTAT) 0.4 MG SL TABLET    Place 0.4 mg under the tongue every 5 (five) minutes as needed for chest pain.    NOVOLOG FLEXPEN 100 UNIT/ML FLEXPEN    Inject 55 Units into the skin 3 (three) times daily with meals as needed for high blood sugar. As needed for blood sugar over 150. Add 1 unit for every point over 150   PANTOPRAZOLE (PROTONIX) 40 MG TABLET    Take 1 tablet by mouth once daily   PRAMIPEXOLE (MIRAPEX) 1 MG TABLET    Take 1 mg by mouth at bedtime.   PREGABALIN (LYRICA) 75 MG CAPSULE    Take 75 mg by mouth at bedtime.   TORSEMIDE (DEMADEX) 20 MG TABLET    Take 1 tablet (20 mg total) by mouth 2 (two) times daily.   TRESIBA FLEXTOUCH 200 UNIT/ML FLEXTOUCH PEN    Inject 110 Units into the skin at bedtime.   VALACYCLOVIR (VALTREX) 1000 MG TABLET    Take 1,000 mg by mouth daily as needed (fever blisters).  Modified Medications   No medications on file  Discontinued Medications   No medications on file      Past Medical History:  Diagnosis Date   Anxiety    Arthritis    Bursitis of right hip    CAD (coronary artery disease)    Cardiac catheterization June 2014 in [place] - 50% circumflex stenosis   Chest pain, neg MI, stable CAD non obstructive on cath 10/05/20 10/04/2020    Chronic diastolic heart failure (HCC) 08/20/2017   Chronic kidney disease, stage 3 (HCC)    does not see nephrologist   Chronic low back pain without sciatica 03/14/2016   CKD (chronic kidney disease), stage III (HCC) 10/07/2020   Complication of anesthesia    Cough 04/19/2017   Overview:  Last Assessment & Plan:  Formatting of this note may be different from the original. Cough - ? ACE  related with AR triggers   Plan  Patient Instructions  Discuss with your primary doctor that lisinopril pain, need making your cough worse. May use Mucinex DM twice daily as needed for cough and congestion Zyrtec 10 mg at bedtime as needed for drainage Saline nasal spray as needed. Lab tests today Activity as tolerated. Follow with Dr. Craige Cotta in 3-4 months and As needed   Please contact office for sooner follow up if symptoms do not improve or worsen or seek emergency care    Depression    Dyspnea    with exertion   " lazy lung" - per  Dr Craige Cotta from back issues- 06/2016   Elevated liver enzymes 12/05/2016   Essential hypertension    GERD (gastroesophageal reflux disease)    Gout 03/14/2016   Greater trochanteric bursitis of right hip 02/02/2012   H/O hiatal hernia    Heart murmur    History of blood transfusion 2016   History of esophageal stricture 10/07/2020   History of kidney stones    Hypercholesterolemia    Hypertensive heart disease with heart failure (HCC) 01/01/2017   Hypoxia 10/07/2020   Iliotibial band syndrome of right side 02/02/2012   Iron deficiency anemia due to chronic blood loss 03/14/2016   LBBB (left bundle branch block) 01/01/2017   Left bundle branch block    Leg weakness 10/07/2020   Lumbar stenosis    Meralgia paraesthetica 12/05/2016   Mild CAD 11/24/2015   Morbid obesity (HCC) 10/07/2020   Neuropathy    OSA (obstructive sleep apnea) 05/24/2016   Overview:  Managed PULM   PONV (postoperative nausea and vomiting)    "no N/V with patch"   Restless leg syndrome    S/P lumbar laminectomy  11/26/2015   S/P lumbar spinal fusion 08/29/2016   Tinnitus 12/05/2016   Type 2 diabetes mellitus (HCC)    UTI (urinary tract infection) 10/07/2020    Social History   Tobacco Use   Smoking status: Never   Smokeless tobacco: Never   Tobacco comments:    Prior secondhand smoke  Vaping Use   Vaping Use: Never used  Substance Use Topics   Alcohol use: Yes    Alcohol/week: 0.0 standard drinks of alcohol    Comment: Rarely   Drug use: No    Family History  Problem Relation Age of Onset   Lung cancer Father        smoked   Hypertension Father    Stroke Father    Heart failure Mother    Bone cancer Sister    Asthma Sister     Allergies  Allergen Reactions   Atorvastatin Nausea And Vomiting and Other (See Comments)    MYALGIAS   Talwin [Pentazocine] Other (See Comments)    headache   Other Nausea Only    UNSPECIFIED Anesthesia    Review of Systems  Constitutional: Negative.   Musculoskeletal:  Positive for back pain.  All other systems reviewed and are negative.    OBJECTIVE:    Vitals:   02/08/23 1354  BP: 102/65  Pulse: 79  Temp: 97.9 F (36.6 C)  TempSrc: Oral  SpO2: 96%  Weight: 188 lb (85.3 kg)  Height: 5\' 2"  (1.575 m)   Body mass index is 34.39 kg/m.  Physical Exam Constitutional:      General: She is not in acute distress.    Appearance: Normal appearance.  HENT:     Head: Normocephalic and atraumatic.  Eyes:     Extraocular Movements: Extraocular  movements intact.     Conjunctiva/sclera: Conjunctivae normal.  Abdominal:     General: There is no distension.     Palpations: Abdomen is soft.  Musculoskeletal:     Cervical back: Normal range of motion and neck supple.  Skin:    General: Skin is warm and dry.  Neurological:     General: No focal deficit present.     Mental Status: She is alert and oriented to person, place, and time.  Psychiatric:        Mood and Affect: Mood normal.        Behavior: Behavior normal.      Labs and  Microbiology:    Latest Ref Rng & Units 02/08/2023    2:22 AM 12/10/2022    7:46 AM 12/09/2022    6:40 AM  CBC  WBC 3.8 - 10.8 Thousand/uL 6.4  6.3  5.6   Hemoglobin 11.7 - 15.5 g/dL 16.1  09.6  9.7   Hematocrit 35.0 - 45.0 % 32.0  31.6  28.3   Platelets 140 - 400 Thousand/uL 144  142  133       Latest Ref Rng & Units 02/08/2023    2:22 AM 12/10/2022    7:46 AM 12/09/2022    6:40 AM  CMP  Glucose 65 - 99 mg/dL 66  045  409   BUN 7 - 25 mg/dL 45  18  20   Creatinine 0.50 - 1.05 mg/dL 8.11  9.14  7.82   Sodium 135 - 146 mmol/L 136  136  135   Potassium 3.5 - 5.3 mmol/L 3.8  3.6  3.9   Chloride 98 - 110 mmol/L 94  98  95   CO2 20 - 32 mmol/L 31  31  32   Calcium 8.6 - 10.4 mg/dL 9.5  8.7  8.8        ASSESSMENT & PLAN:    Diskitis Patient here for follow up today for complicated spinal infection following spinal cord stimulator in December 2023 with discitis/OM at T11-12 and suspected right sided septic arthritis with thin epidural phlegmon/abscess noted on follow up MRI 01/10/23.  Status post 6 weeks of IV vancomycin and cefepime completed on 01/17/23.  This antibiotic course was complicated by supratherapeutic vancomycin levels and acute kidney injury requiring admission at [facility] x 9 days.    Discussed concern for ongoing infection with patient based on her follow up MRI 4/3 and would recommend chronic suppressive therapy given implanted materials as well as prior extensive lumbar fusion.  Her aspiration cultures during admission at [facility] in February/March were no growth, but she did have prior lumbar wound dehiscence after instrumented fusion in November 2017 with I&D cultures at that time growing Serratia marcescens.  Will repeat inflammatory markers, BMP, CBC today for new baseline and prescribe renally dosed antibiotics based on this result, likely Levaquin and Doxycycline.  She also is asking whether she should have stimulator removed, however, will defer this conversation to her and  Dr Yetta Barre.  He eluded to the fact that if this is removed then placing a new stimulator in the future would likely be prohibitive.  Follow up again in 6 weeks.    ADDENDUM 10:11 AM 02/09/23 Patients labs with creatinine 1.7 and creatinine clearance approximately 43.  Discussed with pharmacy and we will plan for ciprofloxacin 500 mg twice daily and doxycycline 100 mg twice daily for empiric antibiotic suppression.  If creatinine clearance improves above 50, would consider increasing ciprofloxacin dose to  750 mg twice daily given prior history of Serratia.  Her CRP has improved, however, ESR remains quite elevated and concern for ongoing infection remains that I think suppressive antibiotics is likely warranted in this case.  Will send prescriptions to her pharmacy and follow-up as planned.   Orders Placed This Encounter  Procedures   CBC   Basic metabolic panel    Order Specific Question:   Has the patient fasted?    Answer:   No   Sedimentation rate   C-reactive protein      Vedia Coffer for Infectious Disease [facility] 02/09/2023, 10:13 AM  I have personally spent 40 minutes involved in face-to-face and non-face-to-face activities for this patient on the day of the visit. Professional time spent includes the following activities: Preparing to see the patient (review of tests), Obtaining and/or reviewing separately obtained history (admission/discharge record), Performing a medically appropriate examination and/or evaluation , Ordering medications/tests/procedures, referring and communicating with other health care professionals, Documenting clinical information in the EMR, Independently interpreting results (not separately reported), Communicating results to the patient/family/caregiver, Counseling and educating the patient/family/caregiver and Care coordination (not separately reported).

## 2023-02-08 NOTE — Assessment & Plan Note (Addendum)
Patient here for follow up today for complicated spinal infection following spinal cord stimulator in December 2023 with discitis/OM at T11-12 and suspected right sided septic arthritis with thin epidural phlegmon/abscess noted on follow up MRI 01/10/23.  Status post 6 weeks of IV vancomycin and cefepime completed on 01/17/23.  This antibiotic course was complicated by supratherapeutic vancomycin levels and acute kidney injury requiring admission at [facility] x 9 days.    Discussed concern for ongoing infection with patient based on her follow up MRI 4/3 and would recommend chronic suppressive therapy given implanted materials as well as prior extensive lumbar fusion.  Her aspiration cultures during admission at [facility] in February/March were no growth, but she did have prior lumbar wound dehiscence after instrumented fusion in November 2017 with I&D cultures at that time growing Serratia marcescens.  Will repeat inflammatory markers, BMP, CBC today for new baseline and prescribe renally dosed antibiotics based on this result, likely Levaquin and Doxycycline.  She also is asking whether she should have stimulator removed, however, will defer this conversation to her and Dr Yetta Barre.  He eluded to the fact that if this is removed then placing a new stimulator in the future would likely be prohibitive.  Follow up again in 6 weeks.    ADDENDUM 10:11 AM 02/09/23 Patients labs with creatinine 1.7 and creatinine clearance approximately 43.  Discussed with pharmacy and we will plan for ciprofloxacin 500 mg twice daily and doxycycline 100 mg twice daily for empiric antibiotic suppression.  If creatinine clearance improves above 50, would consider increasing ciprofloxacin dose to 750 mg twice daily given prior history of Serratia.  Her CRP has improved, however, ESR remains quite elevated and concern for ongoing infection remains that I think suppressive antibiotics is likely warranted in this case.  Will send prescriptions  to her pharmacy and follow-up as planned.

## 2023-02-08 NOTE — Patient Instructions (Signed)
Thank you for coming to see me today. It was a pleasure seeing you.  To Do: Labs today Based on results will prescribe antibiotics Follow up in 6 weeks, sooner if needed  If you have any questions or concerns, please do not hesitate to call the office at [phone].  Take Care,   Gwynn Burly

## 2023-02-09 ENCOUNTER — Telehealth: Payer: Self-pay

## 2023-02-09 LAB — BASIC METABOLIC PANEL
BUN/Creatinine Ratio: 26 (calc) — ABNORMAL HIGH (ref 6–22)
BUN: 45 mg/dL — ABNORMAL HIGH (ref 7–25)
CO2: 31 mmol/L (ref 20–32)
Calcium: 9.5 mg/dL (ref 8.6–10.4)
Chloride: 94 mmol/L — ABNORMAL LOW (ref 98–110)
Creat: 1.76 mg/dL — ABNORMAL HIGH (ref 0.50–1.05)
Glucose, Bld: 66 mg/dL (ref 65–99)
Potassium: 3.8 mmol/L (ref 3.5–5.3)
Sodium: 136 mmol/L (ref 135–146)

## 2023-02-09 LAB — SEDIMENTATION RATE: Sed Rate: 70 mm/h — ABNORMAL HIGH (ref 0–30)

## 2023-02-09 LAB — CBC
HCT: 32 % — ABNORMAL LOW (ref 35.0–45.0)
Hemoglobin: 10.5 g/dL — ABNORMAL LOW (ref 11.7–15.5)
MCHC: 32.8 g/dL (ref 32.0–36.0)
MCV: 90.4 fL (ref 80.0–100.0)
MPV: 9.7 fL (ref 7.5–12.5)
RDW: 14.2 % (ref 11.0–15.0)
WBC: 6.4 10*3/uL (ref 3.8–10.8)

## 2023-02-09 LAB — C-REACTIVE PROTEIN: CRP: <3 mg/L (ref <8.0)

## 2023-02-09 MED ORDER — DOXYCYCLINE HYCLATE 100 MG PO TABS: 100.0000 mg | ORAL_TABLET | Freq: Two times a day (BID) | ORAL | 2 refills | Status: DC

## 2023-02-09 MED ORDER — CIPROFLOXACIN HCL 500 MG PO TABS: 500.0000 mg | ORAL_TABLET | Freq: Two times a day (BID) | ORAL | 2 refills | Status: DC

## 2023-02-09 NOTE — Telephone Encounter (Signed)
Patient called office today to follow up on antibiotics. States she called pharmacy and was told that they did not have a prescription. Spoke with Dr. Earlene Plater who is working with pharmacy team regarding which antibiotics to send to pharmacy. Is requesting call back once prescription is sent to pharmacy. Juanita Laster, RMA

## 2023-02-09 NOTE — Telephone Encounter (Signed)
Called patient to inform her that provider has called in antibiotics.  Did not have any questions at this time. Juanita Laster, RMA

## 2023-02-09 NOTE — Addendum Note (Signed)
Addended by: Kathlynn Grate on: 02/09/2023 10:13 AM   Modules accepted: Orders

## 2023-02-13 ENCOUNTER — Inpatient Hospital Stay (HOSPITAL_COMMUNITY)
Admission: EM | Admit: 2023-02-13 | Discharge: 2023-02-22 | DRG: 029 | Disposition: A | Discharge status: Home Health | Payer: PPO | Attending: Family Medicine | Admitting: Family Medicine

## 2023-02-13 ENCOUNTER — Telehealth: Payer: Self-pay | Admitting: Nurse Practitioner

## 2023-02-13 ENCOUNTER — Emergency Department (HOSPITAL_COMMUNITY): Payer: PPO

## 2023-02-13 ENCOUNTER — Other Ambulatory Visit: Payer: Self-pay

## 2023-02-13 ENCOUNTER — Encounter (HOSPITAL_COMMUNITY): Payer: Self-pay

## 2023-02-13 ENCOUNTER — Observation Stay (HOSPITAL_COMMUNITY): Payer: PPO

## 2023-02-13 DIAGNOSIS — E7800 Pure hypercholesterolemia, unspecified: Secondary | ICD-10-CM | POA: Diagnosis present

## 2023-02-13 DIAGNOSIS — G062 Extradural and subdural abscess, unspecified: Principal | ICD-10-CM

## 2023-02-13 DIAGNOSIS — A498 Other bacterial infections of unspecified site: Secondary | ICD-10-CM | POA: Diagnosis not present

## 2023-02-13 DIAGNOSIS — E11649 Type 2 diabetes mellitus with hypoglycemia without coma: Secondary | ICD-10-CM | POA: Diagnosis not present

## 2023-02-13 DIAGNOSIS — T85193A Other mechanical complication of implanted electronic neurostimulator, generator, initial encounter: Secondary | ICD-10-CM | POA: Diagnosis not present

## 2023-02-13 DIAGNOSIS — E1151 Type 2 diabetes mellitus with diabetic peripheral angiopathy without gangrene: Secondary | ICD-10-CM | POA: Diagnosis present

## 2023-02-13 DIAGNOSIS — M4804 Spinal stenosis, thoracic region: Secondary | ICD-10-CM | POA: Diagnosis present

## 2023-02-13 DIAGNOSIS — Z7984 Long term (current) use of oral hypoglycemic drugs: Secondary | ICD-10-CM

## 2023-02-13 DIAGNOSIS — M4640 Discitis, unspecified, site unspecified: Secondary | ICD-10-CM | POA: Diagnosis present

## 2023-02-13 DIAGNOSIS — K219 Gastro-esophageal reflux disease without esophagitis: Secondary | ICD-10-CM | POA: Diagnosis present

## 2023-02-13 DIAGNOSIS — M4644 Discitis, unspecified, thoracic region: Secondary | ICD-10-CM | POA: Diagnosis not present

## 2023-02-13 DIAGNOSIS — I959 Hypotension, unspecified: Secondary | ICD-10-CM

## 2023-02-13 DIAGNOSIS — M4624 Osteomyelitis of vertebra, thoracic region: Secondary | ICD-10-CM | POA: Diagnosis present

## 2023-02-13 DIAGNOSIS — Z7985 Long-term (current) use of injectable non-insulin antidiabetic drugs: Secondary | ICD-10-CM

## 2023-02-13 DIAGNOSIS — D631 Anemia in chronic kidney disease: Secondary | ICD-10-CM | POA: Diagnosis present

## 2023-02-13 DIAGNOSIS — T85192A Other mechanical complication of implanted electronic neurostimulator (electrode) of spinal cord, initial encounter: Secondary | ICD-10-CM

## 2023-02-13 DIAGNOSIS — E669 Obesity, unspecified: Secondary | ICD-10-CM | POA: Diagnosis present

## 2023-02-13 DIAGNOSIS — G2581 Restless legs syndrome: Secondary | ICD-10-CM | POA: Diagnosis present

## 2023-02-13 DIAGNOSIS — E871 Hypo-osmolality and hyponatremia: Secondary | ICD-10-CM | POA: Diagnosis present

## 2023-02-13 DIAGNOSIS — D696 Thrombocytopenia, unspecified: Secondary | ICD-10-CM | POA: Diagnosis present

## 2023-02-13 DIAGNOSIS — Z6834 Body mass index (BMI) 34.0-34.9, adult: Secondary | ICD-10-CM

## 2023-02-13 DIAGNOSIS — M4645 Discitis, unspecified, thoracolumbar region: Secondary | ICD-10-CM

## 2023-02-13 DIAGNOSIS — I5032 Chronic diastolic (congestive) heart failure: Secondary | ICD-10-CM | POA: Diagnosis present

## 2023-02-13 DIAGNOSIS — G4733 Obstructive sleep apnea (adult) (pediatric): Secondary | ICD-10-CM | POA: Diagnosis present

## 2023-02-13 DIAGNOSIS — I2510 Atherosclerotic heart disease of native coronary artery without angina pectoris: Secondary | ICD-10-CM | POA: Diagnosis present

## 2023-02-13 DIAGNOSIS — Y848 Other medical procedures as the cause of abnormal reaction of the patient, or of later complication, without mention of misadventure at the time of the procedure: Secondary | ICD-10-CM | POA: Diagnosis present

## 2023-02-13 DIAGNOSIS — M5136 Other intervertebral disc degeneration, lumbar region: Secondary | ICD-10-CM

## 2023-02-13 DIAGNOSIS — D638 Anemia in other chronic diseases classified elsewhere: Secondary | ICD-10-CM | POA: Diagnosis present

## 2023-02-13 DIAGNOSIS — Z79899 Other long term (current) drug therapy: Secondary | ICD-10-CM

## 2023-02-13 DIAGNOSIS — E875 Hyperkalemia: Secondary | ICD-10-CM

## 2023-02-13 DIAGNOSIS — Z794 Long term (current) use of insulin: Secondary | ICD-10-CM

## 2023-02-13 DIAGNOSIS — Z981 Arthrodesis status: Secondary | ICD-10-CM

## 2023-02-13 DIAGNOSIS — M109 Gout, unspecified: Secondary | ICD-10-CM | POA: Diagnosis present

## 2023-02-13 DIAGNOSIS — E1122 Type 2 diabetes mellitus with diabetic chronic kidney disease: Secondary | ICD-10-CM | POA: Diagnosis present

## 2023-02-13 DIAGNOSIS — N189 Chronic kidney disease, unspecified: Secondary | ICD-10-CM | POA: Diagnosis present

## 2023-02-13 DIAGNOSIS — Z801 Family history of malignant neoplasm of trachea, bronchus and lung: Secondary | ICD-10-CM

## 2023-02-13 DIAGNOSIS — R400 Somnolence: Secondary | ICD-10-CM

## 2023-02-13 DIAGNOSIS — Z823 Family history of stroke: Secondary | ICD-10-CM

## 2023-02-13 DIAGNOSIS — Z90710 Acquired absence of both cervix and uterus: Secondary | ICD-10-CM

## 2023-02-13 DIAGNOSIS — I130 Hypertensive heart and chronic kidney disease with heart failure and stage 1 through stage 4 chronic kidney disease, or unspecified chronic kidney disease: Secondary | ICD-10-CM | POA: Diagnosis present

## 2023-02-13 DIAGNOSIS — E119 Type 2 diabetes mellitus without complications: Secondary | ICD-10-CM

## 2023-02-13 DIAGNOSIS — Z9049 Acquired absence of other specified parts of digestive tract: Secondary | ICD-10-CM

## 2023-02-13 DIAGNOSIS — N179 Acute kidney failure, unspecified: Secondary | ICD-10-CM

## 2023-02-13 DIAGNOSIS — N1832 Chronic kidney disease, stage 3b: Secondary | ICD-10-CM | POA: Diagnosis present

## 2023-02-13 DIAGNOSIS — Z7982 Long term (current) use of aspirin: Secondary | ICD-10-CM

## 2023-02-13 DIAGNOSIS — F419 Anxiety disorder, unspecified: Secondary | ICD-10-CM | POA: Diagnosis present

## 2023-02-13 DIAGNOSIS — E860 Dehydration: Secondary | ICD-10-CM | POA: Diagnosis present

## 2023-02-13 DIAGNOSIS — Z8249 Family history of ischemic heart disease and other diseases of the circulatory system: Secondary | ICD-10-CM

## 2023-02-13 DIAGNOSIS — Z825 Family history of asthma and other chronic lower respiratory diseases: Secondary | ICD-10-CM

## 2023-02-13 HISTORY — DX: Other bacterial infections of unspecified site: A49.8

## 2023-02-13 LAB — I-STAT VENOUS BLOOD GAS, ED
Acid-Base Excess: 6 mmol/L — ABNORMAL HIGH (ref 0.0–2.0)
Bicarbonate: 30.9 mmol/L — ABNORMAL HIGH (ref 20.0–28.0)
Calcium, Ion: 1.2 mmol/L (ref 1.15–1.40)
HCT: 29 % — ABNORMAL LOW (ref 36.0–46.0)
Hemoglobin: 9.9 g/dL — ABNORMAL LOW (ref 12.0–15.0)
O2 Saturation: 99 %
Potassium: 3.6 mmol/L (ref 3.5–5.1)
Sodium: 137 mmol/L (ref 135–145)
TCO2: 32 mmol/L (ref 22–32)
pCO2, Ven: 47 mmHg (ref 44–60)
pH, Ven: 7.425 (ref 7.25–7.43)
pO2, Ven: 150 mmHg — ABNORMAL HIGH (ref 32–45)

## 2023-02-13 LAB — CBC WITH DIFFERENTIAL/PLATELET
Abs Immature Granulocytes: 0.02 10*3/uL (ref 0.00–0.07)
Basophils Absolute: 0 10*3/uL (ref 0.0–0.1)
Basophils Relative: 1 %
Eosinophils Absolute: 0.3 10*3/uL (ref 0.0–0.5)
Eosinophils Relative: 5 %
HCT: 29.2 % — ABNORMAL LOW (ref 36.0–46.0)
Hemoglobin: 9.9 g/dL — ABNORMAL LOW (ref 12.0–15.0)
Immature Granulocytes: 0 %
Lymphocytes Relative: 16 %
Lymphs Abs: 0.8 10*3/uL (ref 0.7–4.0)
MCH: 30.4 pg (ref 26.0–34.0)
MCHC: 33.9 g/dL (ref 30.0–36.0)
MCV: 89.6 fL (ref 80.0–100.0)
Monocytes Absolute: 0.6 10*3/uL (ref 0.1–1.0)
Monocytes Relative: 11 %
Neutro Abs: 3.4 10*3/uL (ref 1.7–7.7)
Neutrophils Relative %: 67 %
Platelets: 120 10*3/uL — ABNORMAL LOW (ref 150–400)
RBC: 3.26 MIL/uL — ABNORMAL LOW (ref 3.87–5.11)
RDW: 14.7 % (ref 11.5–15.5)
WBC: 5 10*3/uL (ref 4.0–10.5)
nRBC: 0 % (ref 0.0–0.2)

## 2023-02-13 LAB — CBG MONITORING, ED
Glucose-Capillary: 161 mg/dL — ABNORMAL HIGH (ref 70–99)
Glucose-Capillary: 49 mg/dL — ABNORMAL LOW (ref 70–99)
Glucose-Capillary: 102 mg/dL — ABNORMAL HIGH (ref 70–99)

## 2023-02-13 LAB — BASIC METABOLIC PANEL
Anion gap: 12 (ref 5–15)
BUN: 51 mg/dL — ABNORMAL HIGH (ref 8–23)
CO2: 25 mmol/L (ref 22–32)
Calcium: 9.4 mg/dL (ref 8.9–10.3)
Chloride: 93 mmol/L — ABNORMAL LOW (ref 98–111)
Creatinine, Ser: 1.98 mg/dL — ABNORMAL HIGH (ref 0.44–1.00)
GFR, Estimated: 28 mL/min — ABNORMAL LOW (ref >60)
Glucose, Bld: 191 mg/dL — ABNORMAL HIGH (ref 70–99)
Potassium: 4 mmol/L (ref 3.5–5.1)
Sodium: 130 mmol/L — ABNORMAL LOW (ref 135–145)

## 2023-02-13 LAB — GLUCOSE, RANDOM: Glucose, Bld: 50 mg/dL — ABNORMAL LOW (ref 70–99)

## 2023-02-13 LAB — HIV ANTIBODY (ROUTINE TESTING W REFLEX): HIV Screen 4th Generation wRfx: NONREACTIVE

## 2023-02-13 MED ORDER — MIRTAZAPINE 15 MG PO TABS
30.0000 mg | ORAL_TABLET | Freq: Every day | ORAL | Status: DC
  Administered 2023-02-14 – 2023-02-21 (×8): 30 mg via ORAL
  Filled 2023-02-13 (×8): qty 2

## 2023-02-13 MED ORDER — HYDROMORPHONE HCL 1 MG/ML IJ SOLN
0.5000 mg | Freq: Once | INTRAMUSCULAR | Status: AC
  Administered 2023-02-13: 0.5 mg via INTRAVENOUS
  Filled 2023-02-13: qty 1

## 2023-02-13 MED ORDER — DEXTROSE 50 % IV SOLN
50.0000 mL | Freq: Once | INTRAVENOUS | Status: AC
  Administered 2023-02-13: 50 mL via INTRAVENOUS
  Filled 2023-02-13: qty 50

## 2023-02-13 MED ORDER — HEPARIN SODIUM (PORCINE) 5000 UNIT/ML IJ SOLN
5000.0000 [IU] | Freq: Three times a day (TID) | INTRAMUSCULAR | Status: DC
  Administered 2023-02-13 – 2023-02-14 (×2): 5000 [IU] via SUBCUTANEOUS
  Filled 2023-02-13 (×2): qty 1

## 2023-02-13 MED ORDER — CARVEDILOL 6.25 MG PO TABS
6.2500 mg | ORAL_TABLET | Freq: Two times a day (BID) | ORAL | Status: DC
  Administered 2023-02-14 – 2023-02-22 (×17): 6.25 mg via ORAL
  Filled 2023-02-13 (×17): qty 1

## 2023-02-13 MED ORDER — INSULIN ASPART 100 UNIT/ML IJ SOLN
0.0000 [IU] | Freq: Three times a day (TID) | INTRAMUSCULAR | Status: DC
  Administered 2023-02-15: 3 [IU] via SUBCUTANEOUS
  Administered 2023-02-15: 1 [IU] via SUBCUTANEOUS
  Administered 2023-02-16: 2 [IU] via SUBCUTANEOUS
  Administered 2023-02-18: 1 [IU] via SUBCUTANEOUS
  Administered 2023-02-18 – 2023-02-19 (×3): 2 [IU] via SUBCUTANEOUS

## 2023-02-13 MED ORDER — CARVEDILOL 3.125 MG PO TABS: 6.2500 mg | ORAL_TABLET | Freq: Two times a day (BID) | ORAL | Status: DC

## 2023-02-13 MED ORDER — DULOXETINE HCL 60 MG PO CPEP
60.0000 mg | ORAL_CAPSULE | Freq: Every day | ORAL | Status: DC
  Administered 2023-02-14 – 2023-02-21 (×8): 60 mg via ORAL
  Filled 2023-02-13 (×8): qty 1

## 2023-02-13 MED ORDER — GADOBUTROL 1 MMOL/ML IV SOLN
8.0000 mL | Freq: Once | INTRAVENOUS | Status: AC | PRN
  Administered 2023-02-13: 8 mL via INTRAVENOUS

## 2023-02-13 MED ORDER — SODIUM CHLORIDE 0.9 % IV SOLN: INTRAVENOUS | Status: AC

## 2023-02-13 MED ORDER — NALOXONE HCL 0.4 MG/ML IJ SOLN
0.4000 mg | INTRAMUSCULAR | Status: DC | PRN
  Filled 2023-02-13: qty 1

## 2023-02-13 MED ORDER — INSULIN GLARGINE-YFGN 100 UNIT/ML ~~LOC~~ SOLN: 40.0000 [IU] | Freq: Every day | SUBCUTANEOUS | Status: DC

## 2023-02-13 MED ORDER — PANTOPRAZOLE SODIUM 40 MG PO TBEC
40.0000 mg | DELAYED_RELEASE_TABLET | Freq: Every day | ORAL | Status: DC
  Administered 2023-02-14 – 2023-02-22 (×9): 40 mg via ORAL
  Filled 2023-02-13 (×9): qty 1

## 2023-02-13 MED ORDER — ALLOPURINOL 100 MG PO TABS
100.0000 mg | ORAL_TABLET | Freq: Every day | ORAL | Status: DC
  Administered 2023-02-14 – 2023-02-21 (×8): 100 mg via ORAL
  Filled 2023-02-13 (×8): qty 1

## 2023-02-13 MED ORDER — PREGABALIN 75 MG PO CAPS
75.0000 mg | ORAL_CAPSULE | Freq: Every day | ORAL | Status: DC
  Administered 2023-02-14 – 2023-02-21 (×8): 75 mg via ORAL
  Filled 2023-02-13 (×8): qty 1

## 2023-02-13 MED ORDER — ACETAMINOPHEN 325 MG PO TABS
650.0000 mg | ORAL_TABLET | Freq: Four times a day (QID) | ORAL | Status: DC | PRN
  Administered 2023-02-13 – 2023-02-14 (×2): 650 mg via ORAL
  Filled 2023-02-13 (×2): qty 2

## 2023-02-13 MED ORDER — SODIUM CHLORIDE 0.9 % IV BOLUS
1000.0000 mL | Freq: Once | INTRAVENOUS | Status: AC
  Administered 2023-02-13: 1000 mL via INTRAVENOUS

## 2023-02-13 MED ORDER — ONDANSETRON HCL 4 MG/2ML IJ SOLN
4.0000 mg | Freq: Once | INTRAMUSCULAR | Status: AC
  Administered 2023-02-13: 4 mg via INTRAVENOUS
  Filled 2023-02-13: qty 2

## 2023-02-13 MED ORDER — PRAMIPEXOLE DIHYDROCHLORIDE 1 MG PO TABS
1.0000 mg | ORAL_TABLET | Freq: Every day | ORAL | Status: DC
  Administered 2023-02-14 – 2023-02-21 (×8): 1 mg via ORAL
  Filled 2023-02-13 (×9): qty 1

## 2023-02-13 NOTE — Assessment & Plan Note (Deleted)
Kidney function appears to be at baseline. Continue to avoid nephrotoxic agents

## 2023-02-13 NOTE — ED Notes (Signed)
ED TO INPATIENT HANDOFF REPORT  ED Nurse Name and Phone #: Joselyn Glassman [phone]  S Name/Age/Gender Katelyn [name] 68 y.o. female Room/Bed: 035C/035C  Code Status   Code Status: Full Code  Home/SNF/Other Home Patient oriented to: self, place, time, and situation Is this baseline? Yes   Triage Complete: Triage complete  Chief Complaint Discitis [M46.40]  Triage Note Pt c/o left lower back pain that radiates down left buttocks and left kneex3-4d. Pt states has spinal nerve stimulator in back. Pt states her "back dr" wants her to get another MRI of her back. Pt c/o numbness in left knee area. Pt denies loss of bowel or bladder.   Allergies Allergies  Allergen Reactions   Atorvastatin Nausea And Vomiting and Other (See Comments)    MYALGIAS   Talwin [Pentazocine] Other (See Comments)    headache   Other Nausea Only    UNSPECIFIED Anesthesia    Level of Care/Admitting Diagnosis ED Disposition     ED Disposition  Admit   Condition  --   Comment  Hospital Area: [facility] [[id number]]  Level of Care: Progressive [102]  Admit to Progressive based on following criteria: NEUROLOGICAL AND NEUROSURGICAL complex patients with significant risk of instability, who do not meet ICU criteria, yet require close observation or frequent assessment (< / = every 2 - 4 hours) with medical / nursing intervention.  May place patient in observation at [facility] or [facility] if equivalent level of care is available:: Yes  Covid Evaluation: Asymptomatic - no recent exposure (last 10 days) testing not required  Diagnosis: Discitis [[id number]]  Admitting Physician: Alfredo Martinez [[id number]]  Attending Physician: Westley Chandler [[id number]]          B Medical/Surgery History Past Medical History:  Diagnosis Date   Anxiety    Arthritis    Bursitis of right hip    CAD (coronary artery disease)    Cardiac catheterization June 2014 in [place] - 50% circumflex stenosis   Chest  pain, neg MI, stable CAD non obstructive on cath 10/05/20 10/04/2020   Chronic diastolic heart failure (HCC) 08/20/2017   Chronic kidney disease, stage 3 (HCC)    does not see nephrologist   Chronic low back pain without sciatica 03/14/2016   CKD (chronic kidney disease), stage III (HCC) 10/07/2020   Complication of anesthesia    Cough 04/19/2017   Overview:  Last Assessment & Plan:  Formatting of this note may be different from the original. Cough - ? ACE related with AR triggers   Plan  Patient Instructions  Discuss with your primary doctor that lisinopril pain, need making your cough worse. May use Mucinex DM twice daily as needed for cough and congestion Zyrtec 10 mg at bedtime as needed for drainage Saline nasal spray as needed. Lab tests today Activity as tolerated. Follow with Dr. Craige Cotta in 3-4 months and As needed   Please contact office for sooner follow up if symptoms do not improve or worsen or seek emergency care    Depression    Dyspnea    with exertion   " lazy lung" - per  Dr Craige Cotta from back issues- 06/2016   Elevated liver enzymes 12/05/2016   Essential hypertension    GERD (gastroesophageal reflux disease)    Gout 03/14/2016   Greater trochanteric bursitis of right hip 02/02/2012   H/O hiatal hernia    Heart murmur    History of blood transfusion 2016   History of esophageal stricture  10/07/2020   History of kidney stones    Hypercholesterolemia    Hypertensive heart disease with heart failure (HCC) 01/01/2017   Hypoxia 10/07/2020   Iliotibial band syndrome of right side 02/02/2012   Iron deficiency anemia due to chronic blood loss 03/14/2016   LBBB (left bundle branch block) 01/01/2017   Left bundle branch block    Leg weakness 10/07/2020   Lumbar stenosis    Meralgia paraesthetica 12/05/2016   Mild CAD 11/24/2015   Morbid obesity (HCC) 10/07/2020   Neuropathy    OSA (obstructive sleep apnea) 05/24/2016   Overview:  Managed PULM   PONV (postoperative nausea and vomiting)    "no  N/V with patch"   Restless leg syndrome    S/P lumbar laminectomy 11/26/2015   S/P lumbar spinal fusion 08/29/2016   Tinnitus 12/05/2016   Type 2 diabetes mellitus (HCC)    UTI (urinary tract infection) 10/07/2020   Past Surgical History:  Procedure Laterality Date   ABDOMINAL HYSTERECTOMY  1983   APPENDECTOMY  Age 82   BACK SURGERY     FIRST LUMBAR FUSION/ SURGERY APRIL 2012 AND FUSION WITH INSTRUMENTATION SEPT 2012   CARDIAC CATHETERIZATION     x 2   CARPAL TUNNEL RELEASE     bil   CHOLECYSTECTOMY  1990's   COLONOSCOPY  12/12/2017   Colonic polyp status post polypectomy. Mild sigmoid diverticulosis. Otherwise normal colonoscopy to terminal ileum.   CYSTO EXTRACTION KIDNEY STONES     ESOPHAGOGASTRODUODENOSCOPY  12/12/2017   Small hiatal hernia. Mild gastritis. Status post esophageal dilatation.   EXCISION/RELEASE BURSA HIP  02/02/2012   Procedure: EXCISION/RELEASE BURSA HIP;  Surgeon: Jacki Cones, MD;  Location: [facility];  Service: Orthopedics;  Laterality: Right;  Right Hip Bursectomy   EYE SURGERY Bilateral    cataracts   KIDNEY STONE SURGERY  2008   Left knee surgery x 2  1996   reconstruction   LUMBAR DISC SURGERY  08/2016   LUMBAR LAMINECTOMY/DECOMPRESSION MICRODISCECTOMY Right 11/26/2015   Procedure: Extraforaminal Microdiscectomy  - Lumbar two-three- right;  Surgeon: Tia Alert, MD;  Location: [facility];  Service: Neurosurgery;  Laterality: Right;  right    LUMBAR WOUND DEBRIDEMENT N/A 10/25/2016   Procedure: Lumbar wound revision;  Surgeon: Tia Alert, MD;  Location: [facility] OR;  Service: Neurosurgery;  Laterality: N/A;  Lumbar wound revision   Right shoulder surgery  2010   spur   RIGHT/LEFT HEART CATH AND CORONARY ANGIOGRAPHY N/A 10/05/2020   Procedure: RIGHT/LEFT HEART CATH AND CORONARY ANGIOGRAPHY;  Surgeon: [place], Peter M, MD;  Location: [facility] INVASIVE CV LAB;  Service: Cardiovascular;  Laterality: N/A;     A IV Location/Drains/Wounds Patient  Lines/Drains/Airways Status     Active Line/Drains/Airways     Name Placement date Placement time Site Days   Peripheral IV 02/13/23 20 G Left;Posterior Hand 02/13/23  1105  Hand  less than 1   Peripheral IV 02/13/23 20 G Right Forearm 02/13/23  1944  Forearm  less than 1   PICC Single Lumen 12/10/22 Right Cephalic 34 cm 0 cm 12/10/22  4098  Cephalic  65            Intake/Output Last 24 hours No intake or output data in the 24 hours ending 02/13/23 2126  Labs/Imaging Results for orders placed or performed during the hospital encounter of 02/13/23 (from the past 48 hour(s))  Basic metabolic panel     Status: Abnormal   Collection Time: 02/13/23 10:40 AM  Result Value Ref Range   Sodium 130 (L) 135 - 145 mmol/L   Potassium 4.0 3.5 - 5.1 mmol/L   Chloride 93 (L) 98 - 111 mmol/L   CO2 25 22 - 32 mmol/L   Glucose, Bld 191 (H) 70 - 99 mg/dL    Comment: Glucose reference range applies only to samples taken after fasting for at least 8 hours.   BUN 51 (H) 8 - 23 mg/dL   Creatinine, Ser 1.61 (H) 0.44 - 1.00 mg/dL   Calcium 9.4 8.9 - 09.6 mg/dL   GFR, Estimated 28 (L) >60 mL/min    Comment: (NOTE) Calculated using the CKD-EPI Creatinine Equation (2021)    Anion gap 12 5 - 15    Comment: Performed at [facility], [address], [place], [place] [address]  CBC with Differential     Status: Abnormal   Collection Time: 02/13/23 10:40 AM  Result Value Ref Range   WBC 5.0 4.0 - 10.5 K/uL   RBC 3.26 (L) 3.87 - 5.11 MIL/uL   Hemoglobin 9.9 (L) 12.0 - 15.0 g/dL   HCT 98.1 (L) 19.1 - 47.8 %   MCV 89.6 80.0 - 100.0 fL   MCH 30.4 26.0 - 34.0 pg   MCHC 33.9 30.0 - 36.0 g/dL   RDW 29.5 62.1 - 30.8 %   Platelets 120 (L) 150 - 400 K/uL   nRBC 0.0 0.0 - 0.2 %   Neutrophils Relative % 67 %   Neutro Abs 3.4 1.7 - 7.7 K/uL   Lymphocytes Relative 16 %   Lymphs Abs 0.8 0.7 - 4.0 K/uL   Monocytes Relative 11 %   Monocytes Absolute 0.6 0.1 - 1.0 K/uL   Eosinophils Relative 5 %    Eosinophils Absolute 0.3 0.0 - 0.5 K/uL   Basophils Relative 1 %   Basophils Absolute 0.0 0.0 - 0.1 K/uL   Immature Granulocytes 0 %   Abs Immature Granulocytes 0.02 0.00 - 0.07 K/uL    Comment: Performed at [facility], [address], [place], [place] [address]  CBG monitoring, ED     Status: Abnormal   Collection Time: 02/13/23  7:34 PM  Result Value Ref Range   Glucose-Capillary 49 (L) 70 - 99 mg/dL    Comment: Glucose reference range applies only to samples taken after fasting for at least 8 hours.  Glucose, random     Status: Abnormal   Collection Time: 02/13/23  7:44 PM  Result Value Ref Range   Glucose, Bld 50 (L) 70 - 99 mg/dL    Comment: Glucose reference range applies only to samples taken after fasting for at least 8 hours. Performed at [facility], [address], [place], [place] [address]   CBG monitoring, ED     Status: Abnormal   Collection Time: 02/13/23  8:10 PM  Result Value Ref Range   Glucose-Capillary 161 (H) 70 - 99 mg/dL    Comment: Glucose reference range applies only to samples taken after fasting for at least 8 hours.  I-Stat venous blood gas, ED     Status: Abnormal   Collection Time: 02/13/23  8:26 PM  Result Value Ref Range   pH, Ven 7.425 7.25 - 7.43   pCO2, Ven 47.0 44 - 60 mmHg   pO2, Ven 150 (H) 32 - 45 mmHg   Bicarbonate 30.9 (H) 20.0 - 28.0 mmol/L   TCO2 32 22 - 32 mmol/L   O2 Saturation 99 %  Acid-Base Excess 6.0 (H) 0.0 - 2.0 mmol/L   Sodium 137 135 - 145 mmol/L   Potassium 3.6 3.5 - 5.1 mmol/L   Calcium, Ion 1.20 1.15 - 1.40 mmol/L   HCT 29.0 (L) 36.0 - 46.0 %   Hemoglobin 9.9 (L) 12.0 - 15.0 g/dL   Sample type VENOUS    CT HEAD WO CONTRAST ( )  Result Date: 02/13/2023 CLINICAL DATA:  Altered mental status. EXAM: CT HEAD WITHOUT CONTRAST TECHNIQUE: Contiguous axial images were obtained from the base of the skull through the vertex without intravenous contrast. RADIATION DOSE REDUCTION: This exam was performed according  to the departmental dose-optimization program which includes automated exposure control, adjustment of the mA and/or kV according to patient size and/or use of iterative reconstruction technique. COMPARISON:  01/11/2023 FINDINGS: Brain: No evidence of intracranial hemorrhage, acute infarction, hydrocephalus, extra-axial collection, or mass lesion/mass effect. Vascular:  No hyperdense vessel or other acute findings. Skull: No evidence of fracture or other significant bone abnormality. Sinuses/Orbits:  No acute findings. Other: None. IMPRESSION: Negative noncontrast head CT. Electronically Signed   By: Danae Orleans M.D.   On: 02/13/2023 18:53   MR Lumbar Spine W Wo Contrast  Result Date: 02/13/2023 CLINICAL DATA:  Mid back pain.  History of osteomyelitis. EXAM: MRI THORACIC AND LUMBAR SPINE WITHOUT AND WITH CONTRAST TECHNIQUE: Multiplanar and multiecho pulse sequences of the thoracic and lumbar spine were obtained without and with intravenous contrast. CONTRAST:  8mL GADAVIST GADOBUTROL 1 MMOL/ML IV SOLN COMPARISON:  Thoracic spine MRI 12/05/2022. FINDINGS: MRI THORACIC SPINE FINDINGS Alignment: There is grade 1 retrolisthesis of T11 on T12, new/increased since the prior study. Vertebrae: Background marrow signal is normal. There is confluent T1 hypointensity with T2/STIR hyperintensity and enhancement in the T11 and T12 vertebral bodies consistent with discitis/osteomyelitis. Compared to the study from 12/05/2022, endplate destruction has worsened, with progressed loss of vertebral body height at both levels, worse at T12. There is suspected septic arthritis on the right at T11-T12, similar to the prior study (4-6). There is no convincing left septic arthritis. There is both dorsal and ventral epidural enhancement in the spinal canal centered at T11-T12 measuring up to 4 mm in thickness dorsally at T12 consistent with epidural phlegmon resulting in moderate spinal canal stenosis and severe bilateral neural  foraminal stenosis at T11-T12. The dorsal phlegmon extends superiorly to the T8 level. There is no discrete abscess. There is no other evidence of infection. Background marrow signal is normal. Vertebral body heights are otherwise preserved. There enhancement along the T5-T6 and T6-T7 disc spaces without corresponding T1 hypointensity or edema, favored degenerative in nature. Cord: Normal in signal and morphology. A spinal stimulator is noted with the leads terminating at the T6 level. There is no significant enhancement around the distal end of the leads. Paraspinal and other soft tissues: There is edema in the paraspinal soft tissues at T11-T12 without evidence of organized paraspinal abscess. Disc levels: As above, there is moderate spinal canal stenosis and severe bilateral neural foraminal stenosis at T11-T12 and severe bilateral neural foraminal stenosis at T11-T12 due to the infection and epidural phlegmon. Multilevel degenerative changes throughout the remainder of the thoracic spine with scattered disc herniations are unchanged compared to the recent prior study. There is no other significant spinal canal stenosis. There is no evidence of high-grade neural foraminal stenosis. MRI LUMBAR SPINE FINDINGS Segmentation: Standard; the lowest formed disc space is designated L5-S1. Alignment: There is levocurvature centered at L3. There is no significant antero or  retrolisthesis. There is straightening of the normal lumbar lordosis. Vertebrae: Postsurgical changes reflecting posterior instrumented fusion from L2 through L5 are noted. Background marrow signal is normal. There is no suspicious marrow signal abnormality or marrow edema. There is no evidence of discitis/osteomyelitis in the lumbar spine. Conus medullaris: Extends to the L1 level and appears normal. There is no definite abnormal enhancement of the conus or cauda equina nerve roots. Paraspinal and other soft tissues: Unremarkable. Disc levels: There is a  mild disc bulge and mild facet arthropathy resulting mild-to-moderate spinal canal stenosis without significant neural foraminal stenosis L1-L2: There is marked disc degeneration with endplate spurring and bilateral facet arthropathy resulting in mild spinal canal stenosis and mild left and no significant right neural foraminal stenosis, unchanged since the prior MRI from 2022. L2-L3: Status post posterior instrumented fusion without significant residual spinal canal or neural foraminal stenosis L3-L4: Status post posterior instrumented fusion without residual significant spinal canal or neural foraminal stenosis L4-L5: Status post posterior instrumented fusion without residual significant spinal canal or neural foraminal stenosis L5-S1: There is disc bulge eccentric to the left, left worse than right endplate spurring, and bilateral facet arthropathy resulting in mild right worse than left subarticular zone narrowing without evidence of frank nerve root impingement, and mild-to-moderate left and mild right neural foraminal stenosis. Findings are unchanged. IMPRESSION: 1. Persistent and progressed discitis/osteomyelitis at T11-T12 with worsening endplate destruction and loss of vertebral body height at both levels, worse at T12. 2. Epidural phlegmon is similar to the prior study without discrete abscess. Unchanged moderate spinal canal stenosis and severe bilateral neural foraminal stenosis at T11-T12. 3. Probable associated right-sided septic arthritis at T11-T12, similar to the prior study. No convincing left-sided septic arthritis. 4. No other evidence of infection in the thoracic or lumbar spine. 5. Postsurgical changes reflecting posterior instrumented fusion from L2 through L5 without residual significant spinal canal or neural foraminal stenosis at these levels. 6. 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 7. Disc bulge  eccentric to the left at L5-S1 resulting in mild right worse than left subarticular zone narrowing without evidence of frank nerve root impingement, and mild-to-moderate left and mild right neural foraminal stenosis, unchanged. Electronically Signed   By: Lesia Hausen M.D.   On: 02/13/2023 14:44   MR THORACIC SPINE W WO CONTRAST  Result Date: 02/13/2023 CLINICAL DATA:  Mid back pain.  History of osteomyelitis. EXAM: MRI THORACIC AND LUMBAR SPINE WITHOUT AND WITH CONTRAST TECHNIQUE: Multiplanar and multiecho pulse sequences of the thoracic and lumbar spine were obtained without and with intravenous contrast. CONTRAST:  8mL GADAVIST GADOBUTROL 1 MMOL/ML IV SOLN COMPARISON:  Thoracic spine MRI 12/05/2022. FINDINGS: MRI THORACIC SPINE FINDINGS Alignment: There is grade 1 retrolisthesis of T11 on T12, new/increased since the prior study. Vertebrae: Background marrow signal is normal. There is confluent T1 hypointensity with T2/STIR hyperintensity and enhancement in the T11 and T12 vertebral bodies consistent with discitis/osteomyelitis. Compared to the study from 12/05/2022, endplate destruction has worsened, with progressed loss of vertebral body height at both levels, worse at T12. There is suspected septic arthritis on the right at T11-T12, similar to the prior study (4-6). There is no convincing left septic arthritis. There is both dorsal and ventral epidural enhancement in the spinal canal centered at T11-T12 measuring up to 4 mm in thickness dorsally at T12 consistent with epidural phlegmon resulting in moderate spinal canal stenosis and severe bilateral neural foraminal stenosis  at T11-T12. The dorsal phlegmon extends superiorly to the T8 level. There is no discrete abscess. There is no other evidence of infection. Background marrow signal is normal. Vertebral body heights are otherwise preserved. There enhancement along the T5-T6 and T6-T7 disc spaces without corresponding T1 hypointensity or edema, favored  degenerative in nature. Cord: Normal in signal and morphology. A spinal stimulator is noted with the leads terminating at the T6 level. There is no significant enhancement around the distal end of the leads. Paraspinal and other soft tissues: There is edema in the paraspinal soft tissues at T11-T12 without evidence of organized paraspinal abscess. Disc levels: As above, there is moderate spinal canal stenosis and severe bilateral neural foraminal stenosis at T11-T12 and severe bilateral neural foraminal stenosis at T11-T12 due to the infection and epidural phlegmon. Multilevel degenerative changes throughout the remainder of the thoracic spine with scattered disc herniations are unchanged compared to the recent prior study. There is no other significant spinal canal stenosis. There is no evidence of high-grade neural foraminal stenosis. MRI LUMBAR SPINE FINDINGS Segmentation: Standard; the lowest formed disc space is designated L5-S1. Alignment: There is levocurvature centered at L3. There is no significant antero or retrolisthesis. There is straightening of the normal lumbar lordosis. Vertebrae: Postsurgical changes reflecting posterior instrumented fusion from L2 through L5 are noted. Background marrow signal is normal. There is no suspicious marrow signal abnormality or marrow edema. There is no evidence of discitis/osteomyelitis in the lumbar spine. Conus medullaris: Extends to the L1 level and appears normal. There is no definite abnormal enhancement of the conus or cauda equina nerve roots. Paraspinal and other soft tissues: Unremarkable. Disc levels: There is a mild disc bulge and mild facet arthropathy resulting mild-to-moderate spinal canal stenosis without significant neural foraminal stenosis L1-L2: There is marked disc degeneration with endplate spurring and bilateral facet arthropathy resulting in mild spinal canal stenosis and mild left and no significant right neural foraminal stenosis, unchanged  since the prior MRI from 2022. L2-L3: Status post posterior instrumented fusion without significant residual spinal canal or neural foraminal stenosis L3-L4: Status post posterior instrumented fusion without residual significant spinal canal or neural foraminal stenosis L4-L5: Status post posterior instrumented fusion without residual significant spinal canal or neural foraminal stenosis L5-S1: There is disc bulge eccentric to the left, left worse than right endplate spurring, and bilateral facet arthropathy resulting in mild right worse than left subarticular zone narrowing without evidence of frank nerve root impingement, and mild-to-moderate left and mild right neural foraminal stenosis. Findings are unchanged. IMPRESSION: 1. Persistent and progressed discitis/osteomyelitis at T11-T12 with worsening endplate destruction and loss of vertebral body height at both levels, worse at T12. 2. Epidural phlegmon is similar to the prior study without discrete abscess. Unchanged moderate spinal canal stenosis and severe bilateral neural foraminal stenosis at T11-T12. 3. Probable associated right-sided septic arthritis at T11-T12, similar to the prior study. No convincing left-sided septic arthritis. 4. No other evidence of infection in the thoracic or lumbar spine. 5. Postsurgical changes reflecting posterior instrumented fusion from L2 through L5 without residual significant spinal canal or neural foraminal stenosis at these levels. 6. 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 7. Disc bulge eccentric to the left at L5-S1 resulting in mild right worse than left subarticular zone narrowing without evidence of frank nerve root impingement, and mild-to-moderate left and mild right neural foraminal stenosis, unchanged. Electronically Signed   By: Selena Lesser.D.  On: 02/13/2023 14:44    Pending Labs Unresulted Labs (From admission, onward)      Start     Ordered   02/14/23 0500  CBC  Daily,   R      02/13/23 1707   02/14/23 0500  Basic metabolic panel  Daily,   R      02/13/23 1707   02/13/23 1717  Blood culture (routine x 2)  BLOOD CULTURE X 2,   R (with TIMED occurrences)      02/13/23 1716   02/13/23 1704  HIV Antibody (routine testing w rflx)  (HIV Antibody (Routine testing w reflex) panel)  Once,   R        02/13/23 1707   02/13/23 1634  Blood gas, venous  ONCE - STAT,   STAT        02/13/23 1634            Vitals/Pain Today's Vitals   02/13/23 1815 02/13/23 1818 02/13/23 1853 02/13/23 2100  BP:    (!) 159/137  Pulse: 76 76  78  Resp: 12 10  12   Temp:   98 F (36.7 C)   TempSrc:      SpO2: 98% 96%  98%  Weight:      Height:      PainSc:        Isolation Precautions No active isolations  Medications Medications  insulin aspart (novoLOG) injection 0-9 Units (has no administration in time range)  allopurinol (ZYLOPRIM) tablet 100 mg (has no administration in time range)  DULoxetine (CYMBALTA) DR capsule 60 mg (has no administration in time range)  mirtazapine (REMERON) tablet 30 mg (has no administration in time range)  pantoprazole (PROTONIX) EC tablet 40 mg (has no administration in time range)  pramipexole (MIRAPEX) tablet 1 mg (has no administration in time range)  pregabalin (LYRICA) capsule 75 mg (has no administration in time range)  0.9 %  sodium chloride infusion (has no administration in time range)  carvedilol (COREG) tablet 6.25 mg (has no administration in time range)  heparin injection 5,000 Units (has no administration in time range)  HYDROmorphone (DILAUDID) injection 0.5 mg (0.5 mg Intravenous Given 02/13/23 1107)  ondansetron (ZOFRAN) injection 4 mg (4 mg Intravenous Given 02/13/23 1106)  HYDROmorphone (DILAUDID) injection 0.5 mg (0.5 mg Intravenous Given 02/13/23 1437)  gadobutrol (GADAVIST) 1 MMOL/ML injection 8 mL (8 mLs Intravenous Contrast Given 02/13/23 1355)  sodium chloride 0.9 %  bolus 1,000 mL (1,000 mLs Intravenous Bolus 02/13/23 1635)  dextrose 50 % solution 50 mL (50 mLs Intravenous Given 02/13/23 1949)    Mobility walks     Focused Assessments Neuro Assessment Handoff:   R Recommendations: See Admitting Provider Note  Report given to:   Additional Notes: Pt drowsy but arousable.  Per prior RN, pt has not slept much in the last few days due to pain.  Pt received 0.5 mg Dilaudid earlier which allowed her to rest.  Pt does continue to report back/sciatic pain and requesting something for same.

## 2023-02-13 NOTE — Assessment & Plan Note (Addendum)
Successful NSGY intervention consisting of posterior fixation of T11 and L2 and removal of SCS. Per NSGY, no cultures obtain during surgery. Per ID will plan to place a PICC today and start patient on Daptomycin and Cefepime. Pain well controlled, slightly sedated so backed down on Oxy dosing and only using Morphine if severe. POing well, d/c fluids. Plan for PICC for IV abx, held due to elevated CrCl. -NSGY following, appreciate recs -ID following, appreciate recs -Start Daptomycin and Cefepime per ID since no cultures obtained, -Pain management: Tylenol 1000mg  q6h sch, Methocarbamol 500mg  q6h prn, Morphine 2mg  q2h prn, Oxycodone 5mg  q3h prn -Continue bowel regimen with Miralax and Senna 2 tabs daily -PT/OT after receiving TLSO

## 2023-02-13 NOTE — ED Notes (Signed)
Patient transported to CT 

## 2023-02-13 NOTE — ED Notes (Signed)
Pt being transferred to inpatient unit at this time. 

## 2023-02-13 NOTE — Assessment & Plan Note (Signed)
Resolved. AOx4 this AM -Advanced pending IR procedure -Watch for additional somnolence with pain regimen and consider Narcan as needed

## 2023-02-13 NOTE — ED Notes (Signed)
Neurosurgery at the bedside.

## 2023-02-13 NOTE — ED Notes (Signed)
Pt received for care at 1905.  Quietly resting in bed; respirations even and unlabored.  Pt drowsy but arousable.  This RN introduced self to pt.  Bed in lowest position, wheels locked.  Call bell within reach.

## 2023-02-13 NOTE — ED Provider Notes (Signed)
[place] EMERGENCY DEPARTMENT AT [facility] Provider Note   CSN: [id number] Arrival date & time: 02/13/23  1006     History  Chief Complaint  Patient presents with   Back Pain   Numbness    Katelyn [name] is a 67 y.o. female.  Pt with hx ddd, prior remote lumbar fusion, prior nerve stimulator, recent disciitis/infection lower thoracic and upper lumbar region, now on suppressive po abx therapy, c/o increased pain to left lower back radiating to left leg. Occasional tingling/numbness sensation to LLE. No saddle area numbness. No leg weakness. No problems w normal bowel and bladder function. No fever or chills. States recent saw her ID doc and neurosurgeon (Dr Yetta Barre) and is requesting MRI.   The history is provided by the patient, the spouse and medical records.       Home Medications Prior to Admission medications   Medication Sig Start Date End Date Taking? Authorizing Provider  albuterol (VENTOLIN HFA) 108 (90 Base) MCG/ACT inhaler Inhale 2 puffs into the lungs every 6 (six) hours as needed for wheezing or shortness of breath. 02/05/23   Cobb, Ruby Cola, NP  allopurinol (ZYLOPRIM) 100 MG tablet Take 100 mg by mouth at bedtime. 10/20/19   [provider]  aspirin EC 81 MG tablet Take 81 mg by mouth daily. **presently on hold for surgery**    [provider]  carvedilol (COREG) 6.25 MG tablet Take 1 tablet (6.25 mg total) by mouth 2 (two) times daily. Take 6.25 mg by mouth 2 (two) times daily. / Needs appointment for further refills 04/27/21   Baldo Daub, MD  ciprofloxacin (CIPRO) 500 MG tablet Take 1 tablet (500 mg total) by mouth 2 (two) times daily. 02/09/23 05/10/23  Kathlynn Grate, DO  cyclobenzaprine (FLEXERIL) 5 MG tablet Take 5 mg by mouth 3 (three) times daily as needed for muscle spasms. 06/01/22   [provider]  Docusate Sodium (DSS) 100 MG CAPS 100 mg. 01/12/23   [provider]  doxycycline (VIBRA-TABS) 100 MG tablet Take 1  tablet (100 mg total) by mouth 2 (two) times daily. 02/09/23 05/10/23  Kathlynn Grate, DO  DULoxetine (CYMBALTA) 60 MG capsule Take 60 mg by mouth at bedtime.     [provider]  FARXIGA 5 MG TABS tablet Take by mouth. 01/20/23   [provider]  HYDROcodone-acetaminophen (NORCO/VICODIN) 5-325 MG tablet Take 1 tablet by mouth every 6 (six) hours as needed for moderate pain. 12/11/22 12/11/23  [place], Alvira Philips, DO  metFORMIN (GLUCOPHAGE-XR) 500 MG 24 hr tablet Take 500 mg by mouth 2 (two) times daily. 11/11/21   [provider]  mirtazapine (REMERON) 15 MG tablet Take 30 mg by mouth at bedtime.    [provider]  MOUNJARO 5 MG/0.5ML Pen Inject 5 mg into the skin once a week. 05/17/22   [provider]  Multiple Vitamin (MULTIVITAMIN WITH MINERALS) TABS tablet Take 1 tablet by mouth daily.    [provider]  Multiple Vitamins-Minerals (HAIR SKIN AND NAILS FORMULA) TABS Take 1 tablet by mouth daily. 10/17/18   [provider]  nitroGLYCERIN (NITROSTAT) 0.4 MG SL tablet Place 0.4 mg under the tongue every 5 (five) minutes as needed for chest pain.     [provider]  NOVOLOG FLEXPEN 100 UNIT/ML FlexPen Inject 55 Units into the skin 3 (three) times daily with meals as needed for high blood sugar. As needed for blood sugar over 150. Add 1 unit for  every point over 150 11/15/21   [provider]  pantoprazole (PROTONIX) 40 MG tablet Take 1 tablet by mouth once daily 07/31/22   Baldo Daub, MD  pramipexole (MIRAPEX) 1 MG tablet Take 1 mg by mouth at bedtime.    [provider]  pregabalin (LYRICA) 75 MG capsule Take 75 mg by mouth at bedtime. 09/24/21   [provider]  torsemide (DEMADEX) 20 MG tablet Take 1 tablet (20 mg total) by mouth 2 (two) times daily. 01/08/23   Baldo Daub, MD  TRESIBA FLEXTOUCH 200 UNIT/ML FlexTouch Pen Inject 110 Units into the skin at bedtime. 11/15/21   [provider]   valACYclovir (VALTREX) 1000 MG tablet Take 1,000 mg by mouth daily as needed (fever blisters).    [provider]      Allergies    Atorvastatin, Talwin [pentazocine], and Other    Review of Systems   Review of Systems  Constitutional:  Negative for chills and fever.  Respiratory:  Negative for shortness of breath.   Cardiovascular:  Negative for chest pain.  Gastrointestinal:  Negative for abdominal pain, nausea and vomiting.  Genitourinary:  Negative for dysuria.  Musculoskeletal:  Positive for back pain. Negative for neck pain.  Neurological:  Positive for numbness. Negative for weakness and headaches.    Physical Exam Updated Vital Signs BP (!) 92/48   Pulse 73   Temp 98.5 F (36.9 C) (Oral)   Resp 16   Ht 1.575 m (5\' 2" )   Wt 85.3 kg   SpO2 94%   BMI 34.40 kg/m  Physical Exam Vitals and nursing note reviewed.  Constitutional:      Appearance: Normal appearance. She is well-developed.  HENT:     Head: Atraumatic.     Nose: Nose normal.     Mouth/Throat:     Mouth: Mucous membranes are moist.  Eyes:     General: No scleral icterus.    Conjunctiva/sclera: Conjunctivae normal.  Neck:     Trachea: No tracheal deviation.  Cardiovascular:     Rate and Rhythm: Normal rate and regular rhythm.     Pulses: Normal pulses.     Heart sounds: Normal heart sounds. No murmur heard.    No friction rub. No gallop.  Pulmonary:     Effort: Pulmonary effort is normal. No respiratory distress.     Breath sounds: Normal breath sounds.  Abdominal:     General: There is no distension.     Palpations: Abdomen is soft.     Tenderness: There is no abdominal tenderness.  Genitourinary:    Comments: No cva tenderness.  Musculoskeletal:        General: No swelling.     Cervical back: Neck supple. No muscular tenderness.     Comments: Healed lumbar incision/scar, and left lower back stimulator battery site - no external sign of infection to areas. Left lumbar muscular and  sciatic notch are tenderness.   Skin:    General: Skin is warm and dry.     Findings: No rash.  Neurological:     Mental Status: She is alert.     Comments: Alert, speech normal. Motor/sens grossly intact. LLE stre 5/5. Sens intact.   Psychiatric:        Mood and Affect: Mood normal.     ED Results / Procedures / Treatments   Labs (all labs ordered are listed, but only abnormal results are displayed) Results for orders placed or performed during the hospital encounter  of 02/13/23  Basic metabolic panel  Result Value Ref Range   Sodium 130 (L) 135 - 145 mmol/L   Potassium 4.0 3.5 - 5.1 mmol/L   Chloride 93 (L) 98 - 111 mmol/L   CO2 25 22 - 32 mmol/L   Glucose, Bld 191 (H) 70 - 99 mg/dL   BUN 51 (H) 8 - 23 mg/dL   Creatinine, Ser 1.61 (H) 0.44 - 1.00 mg/dL   Calcium 9.4 8.9 - 09.6 mg/dL   GFR, Estimated 28 (L) >60 mL/min   Anion gap 12 5 - 15  CBC with Differential  Result Value Ref Range   WBC 5.0 4.0 - 10.5 K/uL   RBC 3.26 (L) 3.87 - 5.11 MIL/uL   Hemoglobin 9.9 (L) 12.0 - 15.0 g/dL   HCT 04.5 (L) 40.9 - 81.1 %   MCV 89.6 80.0 - 100.0 fL   MCH 30.4 26.0 - 34.0 pg   MCHC 33.9 30.0 - 36.0 g/dL   RDW 91.4 78.2 - 95.6 %   Platelets 120 (L) 150 - 400 K/uL   nRBC 0.0 0.0 - 0.2 %   Neutrophils Relative % 67 %   Neutro Abs 3.4 1.7 - 7.7 K/uL   Lymphocytes Relative 16 %   Lymphs Abs 0.8 0.7 - 4.0 K/uL   Monocytes Relative 11 %   Monocytes Absolute 0.6 0.1 - 1.0 K/uL   Eosinophils Relative 5 %   Eosinophils Absolute 0.3 0.0 - 0.5 K/uL   Basophils Relative 1 %   Basophils Absolute 0.0 0.0 - 0.1 K/uL   Immature Granulocytes 0 %   Abs Immature Granulocytes 0.02 0.00 - 0.07 K/uL    EKG None  Radiology MR Lumbar Spine W Wo Contrast  Result Date: 02/13/2023 CLINICAL DATA:  Mid back pain.  History of osteomyelitis. EXAM: MRI THORACIC AND LUMBAR SPINE WITHOUT AND WITH CONTRAST TECHNIQUE: Multiplanar and multiecho pulse sequences of the thoracic and lumbar spine were obtained  without and with intravenous contrast. CONTRAST:  8mL GADAVIST GADOBUTROL 1 MMOL/ML IV SOLN COMPARISON:  Thoracic spine MRI 12/05/2022. FINDINGS: MRI THORACIC SPINE FINDINGS Alignment: There is grade 1 retrolisthesis of T11 on T12, new/increased since the prior study. Vertebrae: Background marrow signal is normal. There is confluent T1 hypointensity with T2/STIR hyperintensity and enhancement in the T11 and T12 vertebral bodies consistent with discitis/osteomyelitis. Compared to the study from 12/05/2022, endplate destruction has worsened, with progressed loss of vertebral body height at both levels, worse at T12. There is suspected septic arthritis on the right at T11-T12, similar to the prior study (4-6). There is no convincing left septic arthritis. There is both dorsal and ventral epidural enhancement in the spinal canal centered at T11-T12 measuring up to 4 mm in thickness dorsally at T12 consistent with epidural phlegmon resulting in moderate spinal canal stenosis and severe bilateral neural foraminal stenosis at T11-T12. The dorsal phlegmon extends superiorly to the T8 level. There is no discrete abscess. There is no other evidence of infection. Background marrow signal is normal. Vertebral body heights are otherwise preserved. There enhancement along the T5-T6 and T6-T7 disc spaces without corresponding T1 hypointensity or edema, favored degenerative in nature. Cord: Normal in signal and morphology. A spinal stimulator is noted with the leads terminating at the T6 level. There is no significant enhancement around the distal end of the leads. Paraspinal and other soft tissues: There is edema in the paraspinal soft tissues at T11-T12 without evidence of organized paraspinal abscess. Disc levels: As above, there is moderate  spinal canal stenosis and severe bilateral neural foraminal stenosis at T11-T12 and severe bilateral neural foraminal stenosis at T11-T12 due to the infection and epidural phlegmon.  Multilevel degenerative changes throughout the remainder of the thoracic spine with scattered disc herniations are unchanged compared to the recent prior study. There is no other significant spinal canal stenosis. There is no evidence of high-grade neural foraminal stenosis. MRI LUMBAR SPINE FINDINGS Segmentation: Standard; the lowest formed disc space is designated L5-S1. Alignment: There is levocurvature centered at L3. There is no significant antero or retrolisthesis. There is straightening of the normal lumbar lordosis. Vertebrae: Postsurgical changes reflecting posterior instrumented fusion from L2 through L5 are noted. Background marrow signal is normal. There is no suspicious marrow signal abnormality or marrow edema. There is no evidence of discitis/osteomyelitis in the lumbar spine. Conus medullaris: Extends to the L1 level and appears normal. There is no definite abnormal enhancement of the conus or cauda equina nerve roots. Paraspinal and other soft tissues: Unremarkable. Disc levels: There is a mild disc bulge and mild facet arthropathy resulting mild-to-moderate spinal canal stenosis without significant neural foraminal stenosis L1-L2: There is marked disc degeneration with endplate spurring and bilateral facet arthropathy resulting in mild spinal canal stenosis and mild left and no significant right neural foraminal stenosis, unchanged since the prior MRI from 2022. L2-L3: Status post posterior instrumented fusion without significant residual spinal canal or neural foraminal stenosis L3-L4: Status post posterior instrumented fusion without residual significant spinal canal or neural foraminal stenosis L4-L5: Status post posterior instrumented fusion without residual significant spinal canal or neural foraminal stenosis L5-S1: There is disc bulge eccentric to the left, left worse than right endplate spurring, and bilateral facet arthropathy resulting in mild right worse than left subarticular zone  narrowing without evidence of frank nerve root impingement, and mild-to-moderate left and mild right neural foraminal stenosis. Findings are unchanged. IMPRESSION: 1. Persistent and progressed discitis/osteomyelitis at T11-T12 with worsening endplate destruction and loss of vertebral body height at both levels, worse at T12. 2. Epidural phlegmon is similar to the prior study without discrete abscess. Unchanged moderate spinal canal stenosis and severe bilateral neural foraminal stenosis at T11-T12. 3. Probable associated right-sided septic arthritis at T11-T12, similar to the prior study. No convincing left-sided septic arthritis. 4. No other evidence of infection in the thoracic or lumbar spine. 5. Postsurgical changes reflecting posterior instrumented fusion from L2 through L5 without residual significant spinal canal or neural foraminal stenosis at these levels. 6. 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 7. Disc bulge eccentric to the left at L5-S1 resulting in mild right worse than left subarticular zone narrowing without evidence of frank nerve root impingement, and mild-to-moderate left and mild right neural foraminal stenosis, unchanged. Electronically Signed   By: Lesia Hausen M.D.   On: 02/13/2023 14:44   MR THORACIC SPINE W WO CONTRAST  Result Date: 02/13/2023 CLINICAL DATA:  Mid back pain.  History of osteomyelitis. EXAM: MRI THORACIC AND LUMBAR SPINE WITHOUT AND WITH CONTRAST TECHNIQUE: Multiplanar and multiecho pulse sequences of the thoracic and lumbar spine were obtained without and with intravenous contrast. CONTRAST:  8mL GADAVIST GADOBUTROL 1 MMOL/ML IV SOLN COMPARISON:  Thoracic spine MRI 12/05/2022. FINDINGS: MRI THORACIC SPINE FINDINGS Alignment: There is grade 1 retrolisthesis of T11 on T12, new/increased since the prior study. Vertebrae: Background marrow signal is normal. There is confluent T1  hypointensity with T2/STIR hyperintensity and enhancement in the T11 and  T12 vertebral bodies consistent with discitis/osteomyelitis. Compared to the study from 12/05/2022, endplate destruction has worsened, with progressed loss of vertebral body height at both levels, worse at T12. There is suspected septic arthritis on the right at T11-T12, similar to the prior study (4-6). There is no convincing left septic arthritis. There is both dorsal and ventral epidural enhancement in the spinal canal centered at T11-T12 measuring up to 4 mm in thickness dorsally at T12 consistent with epidural phlegmon resulting in moderate spinal canal stenosis and severe bilateral neural foraminal stenosis at T11-T12. The dorsal phlegmon extends superiorly to the T8 level. There is no discrete abscess. There is no other evidence of infection. Background marrow signal is normal. Vertebral body heights are otherwise preserved. There enhancement along the T5-T6 and T6-T7 disc spaces without corresponding T1 hypointensity or edema, favored degenerative in nature. Cord: Normal in signal and morphology. A spinal stimulator is noted with the leads terminating at the T6 level. There is no significant enhancement around the distal end of the leads. Paraspinal and other soft tissues: There is edema in the paraspinal soft tissues at T11-T12 without evidence of organized paraspinal abscess. Disc levels: As above, there is moderate spinal canal stenosis and severe bilateral neural foraminal stenosis at T11-T12 and severe bilateral neural foraminal stenosis at T11-T12 due to the infection and epidural phlegmon. Multilevel degenerative changes throughout the remainder of the thoracic spine with scattered disc herniations are unchanged compared to the recent prior study. There is no other significant spinal canal stenosis. There is no evidence of high-grade neural foraminal stenosis. MRI LUMBAR SPINE FINDINGS Segmentation: Standard; the lowest formed  disc space is designated L5-S1. Alignment: There is levocurvature centered at L3. There is no significant antero or retrolisthesis. There is straightening of the normal lumbar lordosis. Vertebrae: Postsurgical changes reflecting posterior instrumented fusion from L2 through L5 are noted. Background marrow signal is normal. There is no suspicious marrow signal abnormality or marrow edema. There is no evidence of discitis/osteomyelitis in the lumbar spine. Conus medullaris: Extends to the L1 level and appears normal. There is no definite abnormal enhancement of the conus or cauda equina nerve roots. Paraspinal and other soft tissues: Unremarkable. Disc levels: There is a mild disc bulge and mild facet arthropathy resulting mild-to-moderate spinal canal stenosis without significant neural foraminal stenosis L1-L2: There is marked disc degeneration with endplate spurring and bilateral facet arthropathy resulting in mild spinal canal stenosis and mild left and no significant right neural foraminal stenosis, unchanged since the prior MRI from 2022. L2-L3: Status post posterior instrumented fusion without significant residual spinal canal or neural foraminal stenosis L3-L4: Status post posterior instrumented fusion without residual significant spinal canal or neural foraminal stenosis L4-L5: Status post posterior instrumented fusion without residual significant spinal canal or neural foraminal stenosis L5-S1: There is disc bulge eccentric to the left, left worse than right endplate spurring, and bilateral facet arthropathy resulting in mild right worse than left subarticular zone narrowing without evidence of frank nerve root impingement, and mild-to-moderate left and mild right neural foraminal stenosis. Findings are unchanged. IMPRESSION: 1. Persistent and progressed discitis/osteomyelitis at T11-T12 with worsening endplate destruction and loss of vertebral body height at both levels, worse at T12. 2. Epidural phlegmon  is similar to the prior study without discrete abscess. Unchanged moderate spinal canal stenosis and severe bilateral neural foraminal stenosis at T11-T12. 3. Probable associated right-sided septic arthritis at T11-T12, similar to the prior study. No convincing left-sided septic arthritis. 4. No other evidence of infection  in the thoracic or lumbar spine. 5. Postsurgical changes reflecting posterior instrumented fusion from L2 through L5 without residual significant spinal canal or neural foraminal stenosis at these levels. 6. 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 7. Disc bulge eccentric to the left at L5-S1 resulting in mild right worse than left subarticular zone narrowing without evidence of frank nerve root impingement, and mild-to-moderate left and mild right neural foraminal stenosis, unchanged. Electronically Signed   By: Lesia Hausen M.D.   On: 02/13/2023 14:44    Procedures Procedures    Medications Ordered in ED Medications  HYDROmorphone (DILAUDID) injection 0.5 mg (0.5 mg Intravenous Given 02/13/23 1107)  ondansetron (ZOFRAN) injection 4 mg (4 mg Intravenous Given 02/13/23 1106)  HYDROmorphone (DILAUDID) injection 0.5 mg (0.5 mg Intravenous Given 02/13/23 1437)  gadobutrol (GADAVIST) 1 MMOL/ML injection 8 mL (8 mLs Intravenous Contrast Given 02/13/23 1355)    ED Course/ Medical Decision Making/ A&P                             Medical Decision Making Problems Addressed: AKI (acute kidney injury) [facility]): acute illness or injury Discitis of thoracolumbar region: acute illness or injury    Details: Acute on chronic Lumbar degenerative disc disease: chronic illness or injury with exacerbation, progression, or side effects of treatment that poses a threat to life or bodily functions 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 Labs: ordered. Decision-making details documented in ED Course. Radiology: ordered and independent interpretation performed. Decision-making details documented in ED Course.  Risk Prescription drug management. Parenteral controlled substances. Decision regarding hospitalization.   Iv ns. Continuous pulse ox and cardiac monitoring. Labs ordered/sent. Imaging ordered.   Differential diagnosis includes discitis, osteomyelitis, ddd, etc. Dispo decision including potential need for admission considered - will get labs and imaging and reassess.   Reviewed nursing notes and prior charts for additional history. External reports reviewed. Additional history from:  Dilaudid iv, zofran iv.   Cardiac monitor: sinus rhythm, rate 80.  Labs reviewed/interpreted by me - k normal. Wbc normal. (Recent crp normal, recent esr elevated). Cr 1.9 (prior 1.56 12/2022)  Ivf boluses.   MRI reviewed/interpreted by me - persistent/progressive discitis/infection.  Eccentric disc bulge at L5/S1 - ?related to patients c/o worsening radicular left leg pain.   ID consulted. Neurosurgery consulted.   Pain improved w meds. Iv antibiotic given.   Discussed w neurosurgery - Dr Yetta Barre reviewed images, indicates pt eventually may need screws/NS procedure to stabilize area, but for now, admit to medicine for antibiotics, ID consult, etc. No plans for any immediate NS operative intervention.  He will order additional CT imaging to further characterize bony structure of the area.   Discussed w ID, Dr Zenaida Niece dam et al - they will see in consult. Indicate could get IR consult to see if able to aspirate sample to help guide antibiotic therapy. ID indicates hold abx for IR aspiration.   Medicine consulted for admission.  CRITICAL CARE RE: discitis/osteomyelitis of thoracic/lumbar spine, ckd/aki.  Performed by: Suzi Roots Total critical care time: 50  minutes Critical care time was exclusive of separately billable procedures and treating other patients. Critical care was necessary to treat or prevent imminent or life-threatening deterioration. Critical care was time spent personally  by me on the following activities: development of treatment plan with patient and/or surrogate as well as nursing, discussions with consultants, evaluation of patient's response to treatment, examination of patient, obtaining history from patient or surrogate, ordering and performing treatments and interventions, ordering and review of laboratory studies, ordering and review of radiographic studies, pulse oximetry and re-evaluation of patient's condition.         Final Clinical Impression(s) / ED Diagnoses Final diagnoses:  Discitis of thoracolumbar region  Lumbar degenerative disc disease    Rx / DC Orders ED Discharge Orders     None         Cathren Laine, MD 02/13/23 1557

## 2023-02-13 NOTE — ED Triage Notes (Signed)
Pt c/o left lower back pain that radiates down left buttocks and left kneex3-4d. Pt states has spinal nerve stimulator in back. Pt states her "back dr" wants her to get another MRI of her back. Pt c/o numbness in left knee area. Pt denies loss of bowel or bladder.

## 2023-02-13 NOTE — H&P (Signed)
   FMTS Attending Admission Note: Terisa Starr, MD   For questions about this patient, please use [email or web address] to page the family medicine resident on call. Pager number [phone].    I  have seen and examined this patient, reviewed their chart. I have discussed this patient with the resident. I will sign the resident note as available.  67 yo woman with history of chronic back pain with spinal cord stimulator, type 2 DM, and HFpEF presenting with back pain. Unable to obtain history as patient will not awaken even to sternal rub. She received 1 mg dilaudid last at 1425.   Labs and imaging reviewed. Has hyponatremia, slight AKI. No elevated WBC. Mild thrombocytopenia.   Somnolence, unclear if due to dilaudid, CO2 retention, or neurological injury. Will obtain CT head, VBG, give Naloxone. Would be very cautious with narcotics given exam after just 1 mg.  Discitis and osteomyelitis, s/p 6 weeks of antibiotics now with possible worsening phlegmon, appreciate ID consult and care. Will ask Neurosurgery to weigh in on aspiration, may be done with IR ultimately. Hold antibiotics for now. EKG ordered.   Remainder per resident note.

## 2023-02-13 NOTE — Telephone Encounter (Signed)
Patient's authorization has been approved for in lab sleep study. However, this patient is currently admitted at [facility]. Terri at the sleep center states they have to wait two weeks after patient is discharged to schedule her.   Authorization is good til 05/06/23. Sending as Lorain Childes   Thanks

## 2023-02-13 NOTE — ED Notes (Signed)
Patient transported to MRI 

## 2023-02-13 NOTE — Progress Notes (Signed)
FMTS Interim Progress Note  S: Night rounded with Dr. Melba Coon.  Patient was fully awake, and provided clear history.  Patient denies feeling symptomatic during hypoglycemia to 49.  She reports that she last took insulin yesterday, denies taking any today.  Furthermore she reports that her sciatica pain is uncontrolled and requests something for pain control.  She takes oxycodone and pregabalin at home.  Pain is a 6 out of 10 at rest, but will have flares to 10 out of 10.  It radiates down her left leg, and reports that below her left knee feels numb.  O: BP (!) 159/137   Pulse 78   Temp 98 F (36.7 C)   Resp 12   Ht 5\' 2"  (1.575 m)   Wt 85.3 kg   SpO2 98%   BMI 34.40 kg/m   Neuro: Alert and oriented to self, place([facility]), day(Tuesday) Ext: Able to lift BL LE, reports sensation intact BL.   A/P:  Hypoglycemia Was hypoglycemic to 49, improved with D50. Unclear why pt glucose down trended so dramatically without insulin.  Will continue to monitor glucose closely.  - CBG q4h  Discitis  Lower back and L LE sciatica pain Takes Norco 5-325 q6h and pregabalin 75mg  nightly. Pt was very sedated earlier in ED after 1mg  IV dilaudid. During exam, pt was very uncomfortable appearing. CrCl 28.7, so pt is already maxxed on pregabalin dose. Also want to avoid opioids for now given pt's strong response to dilaudid in ED.  - Cont home Pregabalin 75mg  nightly - Add Tylenol q6h prn - Add heating pad - Consider re-introducing home Norco if pt remains alert and pain remains uncontrolled  -Rest of plan per day team H&P  Lincoln Brigham, MD 02/13/2023, 9:55 PM PGY-1, [facility] Family Medicine Service pager [phone]

## 2023-02-13 NOTE — H&P (Signed)
Family Medicine Teaching [facility] Admission History and Physical Service Pager: [phone]  Patient name: Katelyn [name] Medical record number: [id number] Date of birth: 1955-12-03 Age: 67 y.o. Gender: female  Primary Care Provider: Olive Bass, MD Consultants: ID, NSGY Code Status: FULL which was confirmed with family if patient unable to confirm--Husband confirmed at bedside  Preferred Emergency Contact:  Contact Information     Name Relation Home Work Mobile   Quijas,Jerry Spouse   [phone]       Chief Complaint: Back pain, H/o infection  Assessment and Plan: Katelyn [name] is a 67 y.o. female presenting with back pain and radiculopathy in setting of discitis/infection. PMH is significant for anxiety, chronic low back pain with multiple lumbar surgeries, type 2 diabetes, obesity, OSA, left bundle branch block, hypertension, hypercholesterolemia, CKD, CAD.   * Diskitis Progressive discitis/infection found on MRI with spinal stimulator still in place.  Patient somnolent on exam today, unable to evaluate strength.  Neurosurgery and infectious disease have been consulted and will see the patient.  May benefit from IR aspiration to guide antibiotic coverage.  - Admit to FMTS, Dr. Manson Passey as attending - Appreciate neurosurgery and infectious disease recommendations - Blood culture pending - Hold on antibiotics, defer to ID expertise - AM CBC/BMP - May have pending surgery in the future, n.p.o. at midnight unless otherwise specified by neurosurgery - Approach pain control options once more awake   Diabetes mellitus without complication (HCC) Uses Tresiba 100-120 nightly, Novolog with meals up to 20U. Ordering long acting for tonight and adding CBGs. sSSI as well. May need A1C UTD.    Somnolence Possibly in the setting of Dilaudid 1 mg given for pain.  Protecting airway currently.  Needs routine neurochecks.  Obtaining VBG as well as CT head to rule out other possible factors of  change in mentation including hypercarbia, CVA/TIA/hemorrhage.  No evidence of seizure-like activity prior to patient's change in mentation.  No evidence of true uremia on BMP.  - Every 2 neurochecks x 5 occurrences - Narcan as needed - Cautious with pain medication given patient's mentation - CT head - VBG - N.p.o. -- CTM  CKD (chronic kidney disease) AKI on CKD 1.97 with baseline Cr 1.07. Avoid nephrotoxic agents, tenuous volume status. May need gentle fluids.     HTN: Restart home carvedilol  HLD: Will need to restart home rosuvastatin  RLS: Pramipexole  Gout: Allopurinol   FEN/GI: NPO>mentation  Prophylaxis: Hep per pharmacy  Disposition: Progressive  History of Present Illness:  Katelyn [name] is a 67 y.o. female presenting with concern for recurrent spinal infection, has had multiple spinal surgeries in the past.   Patient with history of T11-T12 discitis-osteomyelitis with epidural phlegmon from T8-T9 through L1-L2 in the setting of recent spinal stimulator placement patient had IR aspiration at that time in February 2024 that showed no growth.  She was placed on vancomycin and cefepime after the decision was made for no surgical intervention.  During this time, patient had hospitalization at [facility] for AKI likely induced by antibiotics, heart failure/volume overload that required brief ICU stay and 9 total days in hospital.  She was then placed on chronic suppressive therapy with what appears to be Cipro and doxycycline.   Saw physician for the stimulator, had a change in the programming from baseline. Stimulator has been off for the last 3-4 days.   She has since had terrible pain down the back and leg that caused her to come to  the ED.   Uses a walker at home.   In ED, received Dilaudid 1 mg as well as Zofran for nausea.  The patient had BMP with a creatinine of 1.9, received fluid bolus.  Patient had persistent/progressive discitis on MRI that was obtained in the  ED.  ID and neurosurgery were also consulted.  Level 5 caveat, unable to obtain history from patient but large majority from husband   Review Of Systems: Per HPI with the following additions: Level 5 caveat    Past Medical History: Chronic Back Pain  Anxiety DM2 Obesity HTN HLD CKD CAD  Past Surgical History: Lumbar fusion and laminectomy  Spinal stimulator implant   Social History:  Additional social history: Lives with husband. Does not drink, smoke tobacco, denies drug use  Please also refer to relevant sections of EMR.  Family History: Cancer  Heart failure  HTN    Allergies and Medications: Carvedilol 6.25 mg BID Pantoprazole 40mg  daily Duloxetine 60 mg daily  Mirtazipine 15 mg daily  Metformin 500 BID Torsemide 20 daily  Allopurinol 100 mg daily Pramipexole 1 mg bedtime  Pregabalin 75 daily Tresiba 100-120 nightly  Short acting not over 20 with meals  Rosuvastatin 10 mg daily Nitro    Objective: BP (!) 111/51   Pulse 76   Temp 98.5 F (36.9 C) (Oral)   Resp 10   Ht 5\' 2"  (1.575 m)   Wt 85.3 kg   SpO2 96%   BMI 34.40 kg/m  Exam: General: Somnolent, breathing normally but unresponsive to sternal rub Eyes: PERRLA ENTM: No abnormalities noted  Neck: NTTP, patient somnolent but did not awaken or grimace to palpation Cardiovascular: RRR Respiratory: CTAB Gastrointestinal: Nondistended and soft  Derm: no rashes  Neuro: Unable to assess  Psych: Unable to arouse, somnolent   Labs and Imaging: CBC BMET  Recent Labs  Lab 02/13/23 1040  WBC 5.0  HGB 9.9*  HCT 29.2*  PLT 120*   Recent Labs  Lab 02/13/23 1040  NA 130*  K 4.0  CL 93*  CO2 25  BUN 51*  CREATININE 1.98*  GLUCOSE 191*  CALCIUM 9.4     EKG:  NSR LBBB  MR Lumbar Spine W Wo Contrast  Result Date: 02/13/2023 IMPRESSION: 1. Persistent and progressed discitis/osteomyelitis at T11-T12 with worsening endplate destruction and loss of vertebral body height at both levels,  worse at T12. 2. Epidural phlegmon is similar to the prior study without discrete abscess. Unchanged moderate spinal canal stenosis and severe bilateral neural foraminal stenosis at T11-T12. 3. Probable associated right-sided septic arthritis at T11-T12, similar to the prior study. No convincing left-sided septic arthritis. 4. No other evidence of infection in the thoracic or lumbar spine. 5. Postsurgical changes reflecting posterior instrumented fusion from L2 through L5 without residual significant spinal canal or neural foraminal stenosis at these levels. 6. 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 7. Disc bulge eccentric to the left at L5-S1 resulting in mild right worse than left subarticular zone narrowing without evidence of frank nerve root impingement, and mild-to-moderate left and mild right neural foraminal stenosis, unchanged.   MR THORACIC SPINE W WO CONTRAST  Result Date: 02/13/2023  IMPRESSION: 1. Persistent and progressed discitis/osteomyelitis at T11-T12 with worsening endplate destruction and loss of vertebral body height at both levels, worse at T12. 2. Epidural phlegmon is similar to the prior study without discrete abscess. Unchanged moderate spinal canal stenosis and severe bilateral  neural foraminal stenosis at T11-T12. 3. Probable associated right-sided septic arthritis at T11-T12, similar to the prior study. No convincing left-sided septic arthritis. 4. No other evidence of infection in the thoracic or lumbar spine. 5. Postsurgical changes reflecting posterior instrumented fusion from L2 through L5 without residual significant spinal canal or neural foraminal stenosis at these levels. 6. 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 7. Disc bulge eccentric to the left at L5-S1 resulting in mild right worse than left  subarticular zone narrowing without evidence of frank nerve root impingement, and mild-to-moderate left and mild right neural foraminal stenosis, unchanged.     My interpretation:  Persistent, possibly progressive discitis/OM  Alfredo Martinez, MD 02/13/2023, 6:45 PM PGY-2, [facility] Family Medicine FPTS Intern pager: [phone], text pages welcome

## 2023-02-13 NOTE — ED Notes (Signed)
Pt able to use bedpan with assistance for urinary needs.

## 2023-02-13 NOTE — Assessment & Plan Note (Addendum)
BGL mostly in 100s. Fasting in 200s -Increase to Semglee 20U -sSSI with CBG checks

## 2023-02-13 NOTE — Consult Note (Signed)
[facility]    Date of Admission:  02/13/2023         Reason for Consult: Discitis/Osteomyelitis  Referring Provider: Dr. Cathren Laine Primary Care Provider: Olive Bass, MD   ASSESSMENT:  Katelyn [name] is a 67 y/o female admitted with worsening back pain in the setting of recent treatment for culture negative T11-T12 discitis/osteomyelitis complicated by the presence of nerve stimulator with vancomycin and cefepime and placed on suppressive antibiotics with ciprofloxacin and doxycycline. Previous culture from 2017 did grow Serratia marcescens. MRI with worsening of end plate destruction and stable phlegmon with question of possible worsening infection given new worsening pain. Did have spinal stimulator adjusted which per husband made the pain worse. Recommend holding antibiotics at this time and evaluation by Neurosurgery for any surgical intervention or possible stimulator removal and/or IR for possible aspiration for cultures to guide antibiotic therapy. Would need a washout period as she has been taking Ciprofloxacin and Doxycycline. Continue pain management and remainder of medical and supportive per primary team.    PLAN:  Hold antibiotics.  Recommend evaluation by Neurosurgery and or IR for surgical evaluation or aspiration. Pain management with remaining medical and supportive care per Primary Team.    I have personally spent 28 minutes involved in face-to-face and non-face-to-face activities for this patient on the day of the visit. Professional time spent includes the following activities: Preparing to see the patient (review of tests), Obtaining and/or reviewing separately obtained history (admission/discharge record), Performing a medically appropriate examination and/or evaluation , Ordering medications/tests/procedures, referring and communicating with other health care professionals, Documenting clinical information in the EMR, Independently  interpreting results (not separately reported), Communicating results to the patient/family/caregiver, Counseling and educating the patient/family/caregiver and Care coordination (not separately reported).    Principal Problem:   Diskitis    triamcinolone acetonide  10 mg Other Once   triamcinolone acetonide  10 mg Other Once     HPI: Katelyn [name] is a 67 y.o. female with previous medical history of Type 2 diabetes, CAD, chronic kidney disease Stage III, hypertension, obstructive sleep apnea, and back pain presenting with back pain with radiculopathy down the left buttocks to the knee.   Katelyn [name] is known to the ID service having last been seen on 02/08/23 by Dr. Earlene Plater for complicated spinal infection following spinal cord stimulator in December 2023 with discitis/osteomyelitis of T11-T12 with epidural phlegmon/abscess on MRI 01/10/23 and had completed 6 weeks of IV vancomycin and cefepime on 01/17/23. Course was further complicated by supra-therapeutic vancomycin level and acute kidney injury resulting in hospitalization at [facility]. Cultures have been negative although did have Serratia marcescens grow in November 2017 with a debridement and wound dehiscence. It was recommended to start ciprofloxacin and doxycycline. ESR was elevated at 70 and CRP was normal.   Katelyn [name] is presenting to the ED afebrile without leukocytosis. MRI lumbar and thoracic spine from 02/13/23 with persistent and progressed discitis/osteomyelitis at T11-T12 with worsening endplate destruction with epidural phlegmon similar to prior study without discrete abscess; and probable right sided septic arthritis at T11-T12 similar to prior study. Stimulator was adjusted several days ago and now there is little to no pain control if not making it worse. Has not used the pain stimulator in the last 4 days. Concern for liver function testing given acetaminophen in her pain medication.  Mr. Devenney, husband, provides history in addition  the chart as Katelyn [name] recently received pain medication and  unable to provide history information.   Review of Systems: Review of Systems  Unable to perform ROS: Other     Past Medical History:  Diagnosis Date   Anxiety    Arthritis    Bursitis of right hip    CAD (coronary artery disease)    Cardiac catheterization June 2014 in [place] - 50% circumflex stenosis   Chest pain, neg MI, stable CAD non obstructive on cath 10/05/20 10/04/2020   Chronic diastolic heart failure (HCC) 08/20/2017   Chronic kidney disease, stage 3 (HCC)    does not see nephrologist   Chronic low back pain without sciatica 03/14/2016   CKD (chronic kidney disease), stage III (HCC) 10/07/2020   Complication of anesthesia    Cough 04/19/2017   Overview:  Last Assessment & Plan:  Formatting of this note may be different from the original. Cough - ? ACE related with AR triggers   Plan  Patient Instructions  Discuss with your primary doctor that lisinopril pain, need making your cough worse. May use Mucinex DM twice daily as needed for cough and congestion Zyrtec 10 mg at bedtime as needed for drainage Saline nasal spray as needed. Lab tests today Activity as tolerated. Follow with Dr. Craige Cotta in 3-4 months and As needed   Please contact office for sooner follow up if symptoms do not improve or worsen or seek emergency care    Depression    Dyspnea    with exertion   " lazy lung" - per  Dr Craige Cotta from back issues- 06/2016   Elevated liver enzymes 12/05/2016   Essential hypertension    GERD (gastroesophageal reflux disease)    Gout 03/14/2016   Greater trochanteric bursitis of right hip 02/02/2012   H/O hiatal hernia    Heart murmur    History of blood transfusion 2016   History of esophageal stricture 10/07/2020   History of kidney stones    Hypercholesterolemia    Hypertensive heart disease with heart failure (HCC) 01/01/2017   Hypoxia 10/07/2020   Iliotibial band syndrome of right side 02/02/2012   Iron deficiency  anemia due to chronic blood loss 03/14/2016   LBBB (left bundle branch block) 01/01/2017   Left bundle branch block    Leg weakness 10/07/2020   Lumbar stenosis    Meralgia paraesthetica 12/05/2016   Mild CAD 11/24/2015   Morbid obesity (HCC) 10/07/2020   Neuropathy    OSA (obstructive sleep apnea) 05/24/2016   Overview:  Managed PULM   PONV (postoperative nausea and vomiting)    "no N/V with patch"   Restless leg syndrome    S/P lumbar laminectomy 11/26/2015   S/P lumbar spinal fusion 08/29/2016   Tinnitus 12/05/2016   Type 2 diabetes mellitus (HCC)    UTI (urinary tract infection) 10/07/2020    Social History   Tobacco Use   Smoking status: Never   Smokeless tobacco: Never   Tobacco comments:    Prior secondhand smoke  Vaping Use   Vaping Use: Never used  Substance Use Topics   Alcohol use: Yes    Alcohol/week: 0.0 standard drinks of alcohol    Comment: Rarely   Drug use: No    Family History  Problem Relation Age of Onset   Lung cancer Father        smoked   Hypertension Father    Stroke Father    Heart failure Mother    Bone cancer Sister    Asthma Sister     Allergies  Allergen Reactions   Atorvastatin Nausea And Vomiting and Other (See Comments)    MYALGIAS   Talwin [Pentazocine] Other (See Comments)    headache   Other Nausea Only    UNSPECIFIED Anesthesia    OBJECTIVE: Blood pressure (!) 92/48, pulse 73, temperature 98.5 F (36.9 C), temperature source Oral, resp. rate 16, height 5\' 2"  (1.575 m), weight 85.3 kg, SpO2 94 %.  Physical Exam Constitutional:      General: She is not in acute distress.    Appearance: She is well-developed.  Cardiovascular:     Rate and Rhythm: Normal rate and regular rhythm.     Heart sounds: Normal heart sounds.  Pulmonary:     Effort: Pulmonary effort is normal.     Breath sounds: Normal breath sounds.  Skin:    General: Skin is warm and dry.     Lab Results Lab Results  Component Value Date   WBC 5.0  02/13/2023   HGB 9.9 (L) 02/13/2023   HCT 29.2 (L) 02/13/2023   MCV 89.6 02/13/2023   PLT 120 (L) 02/13/2023    Lab Results  Component Value Date   CREATININE 1.98 (H) 02/13/2023   BUN 51 (H) 02/13/2023   NA 130 (L) 02/13/2023   K 4.0 02/13/2023   CL 93 (L) 02/13/2023   CO2 25 02/13/2023    Lab Results  Component Value Date   ALT 25 12/06/2022   AST 34 12/06/2022   ALKPHOS 153 (H) 12/06/2022   BILITOT 0.6 12/06/2022     Microbiology: No results found for this or any previous visit (from the past 240 hour(s)).   Marcos Eke, NP [facility]  02/13/2023  4:03 PM

## 2023-02-14 ENCOUNTER — Inpatient Hospital Stay (HOSPITAL_COMMUNITY): Payer: PPO

## 2023-02-14 ENCOUNTER — Encounter (HOSPITAL_COMMUNITY): Payer: Self-pay | Admitting: Student

## 2023-02-14 DIAGNOSIS — T85193A Other mechanical complication of implanted electronic neurostimulator, generator, initial encounter: Secondary | ICD-10-CM | POA: Diagnosis present

## 2023-02-14 DIAGNOSIS — Z981 Arthrodesis status: Secondary | ICD-10-CM | POA: Diagnosis not present

## 2023-02-14 DIAGNOSIS — D631 Anemia in chronic kidney disease: Secondary | ICD-10-CM | POA: Diagnosis present

## 2023-02-14 DIAGNOSIS — N179 Acute kidney failure, unspecified: Secondary | ICD-10-CM | POA: Diagnosis present

## 2023-02-14 DIAGNOSIS — E1151 Type 2 diabetes mellitus with diabetic peripheral angiopathy without gangrene: Secondary | ICD-10-CM | POA: Diagnosis not present

## 2023-02-14 DIAGNOSIS — E1122 Type 2 diabetes mellitus with diabetic chronic kidney disease: Secondary | ICD-10-CM

## 2023-02-14 DIAGNOSIS — T85192D Other mechanical complication of implanted electronic neurostimulator (electrode) of spinal cord, subsequent encounter: Secondary | ICD-10-CM

## 2023-02-14 DIAGNOSIS — I10 Essential (primary) hypertension: Secondary | ICD-10-CM | POA: Diagnosis not present

## 2023-02-14 DIAGNOSIS — N1832 Chronic kidney disease, stage 3b: Secondary | ICD-10-CM

## 2023-02-14 DIAGNOSIS — E7800 Pure hypercholesterolemia, unspecified: Secondary | ICD-10-CM | POA: Diagnosis present

## 2023-02-14 DIAGNOSIS — A498 Other bacterial infections of unspecified site: Secondary | ICD-10-CM | POA: Diagnosis not present

## 2023-02-14 DIAGNOSIS — G2581 Restless legs syndrome: Secondary | ICD-10-CM | POA: Diagnosis present

## 2023-02-14 DIAGNOSIS — E860 Dehydration: Secondary | ICD-10-CM | POA: Diagnosis present

## 2023-02-14 DIAGNOSIS — G4733 Obstructive sleep apnea (adult) (pediatric): Secondary | ICD-10-CM | POA: Diagnosis present

## 2023-02-14 DIAGNOSIS — D696 Thrombocytopenia, unspecified: Secondary | ICD-10-CM | POA: Diagnosis present

## 2023-02-14 DIAGNOSIS — I5032 Chronic diastolic (congestive) heart failure: Secondary | ICD-10-CM | POA: Diagnosis present

## 2023-02-14 DIAGNOSIS — E875 Hyperkalemia: Secondary | ICD-10-CM | POA: Diagnosis present

## 2023-02-14 DIAGNOSIS — M4644 Discitis, unspecified, thoracic region: Secondary | ICD-10-CM | POA: Diagnosis present

## 2023-02-14 DIAGNOSIS — I130 Hypertensive heart and chronic kidney disease with heart failure and stage 1 through stage 4 chronic kidney disease, or unspecified chronic kidney disease: Secondary | ICD-10-CM | POA: Diagnosis present

## 2023-02-14 DIAGNOSIS — E11649 Type 2 diabetes mellitus with hypoglycemia without coma: Secondary | ICD-10-CM | POA: Diagnosis not present

## 2023-02-14 DIAGNOSIS — Z794 Long term (current) use of insulin: Secondary | ICD-10-CM | POA: Diagnosis not present

## 2023-02-14 DIAGNOSIS — E669 Obesity, unspecified: Secondary | ICD-10-CM | POA: Diagnosis present

## 2023-02-14 DIAGNOSIS — F419 Anxiety disorder, unspecified: Secondary | ICD-10-CM | POA: Diagnosis present

## 2023-02-14 DIAGNOSIS — M4804 Spinal stenosis, thoracic region: Secondary | ICD-10-CM | POA: Diagnosis not present

## 2023-02-14 DIAGNOSIS — J449 Chronic obstructive pulmonary disease, unspecified: Secondary | ICD-10-CM | POA: Diagnosis not present

## 2023-02-14 DIAGNOSIS — M4624 Osteomyelitis of vertebra, thoracic region: Secondary | ICD-10-CM | POA: Diagnosis present

## 2023-02-14 DIAGNOSIS — K219 Gastro-esophageal reflux disease without esophagitis: Secondary | ICD-10-CM | POA: Diagnosis present

## 2023-02-14 DIAGNOSIS — M532X4 Spinal instabilities, thoracic region: Secondary | ICD-10-CM | POA: Diagnosis not present

## 2023-02-14 DIAGNOSIS — E871 Hypo-osmolality and hyponatremia: Secondary | ICD-10-CM | POA: Diagnosis present

## 2023-02-14 DIAGNOSIS — M109 Gout, unspecified: Secondary | ICD-10-CM | POA: Diagnosis present

## 2023-02-14 DIAGNOSIS — E119 Type 2 diabetes mellitus without complications: Secondary | ICD-10-CM | POA: Diagnosis not present

## 2023-02-14 DIAGNOSIS — Y848 Other medical procedures as the cause of abnormal reaction of the patient, or of later complication, without mention of misadventure at the time of the procedure: Secondary | ICD-10-CM | POA: Diagnosis present

## 2023-02-14 DIAGNOSIS — M4645 Discitis, unspecified, thoracolumbar region: Secondary | ICD-10-CM | POA: Diagnosis not present

## 2023-02-14 HISTORY — PX: IR THORACIC DISC ASPIRATION W/IMG GUIDE: IMG943

## 2023-02-14 LAB — BASIC METABOLIC PANEL
Anion gap: 10 (ref 5–15)
BUN: 51 mg/dL — ABNORMAL HIGH (ref 8–23)
CO2: 29 mmol/L (ref 22–32)
Calcium: 9.2 mg/dL (ref 8.9–10.3)
Chloride: 95 mmol/L — ABNORMAL LOW (ref 98–111)
Creatinine, Ser: 1.88 mg/dL — ABNORMAL HIGH (ref 0.44–1.00)
GFR, Estimated: 29 mL/min — ABNORMAL LOW (ref >60)
Glucose, Bld: 88 mg/dL (ref 70–99)
Potassium: 4.2 mmol/L (ref 3.5–5.1)
Sodium: 134 mmol/L — ABNORMAL LOW (ref 135–145)

## 2023-02-14 LAB — GLUCOSE, CAPILLARY
Glucose-Capillary: 109 mg/dL — ABNORMAL HIGH (ref 70–99)
Glucose-Capillary: 143 mg/dL — ABNORMAL HIGH (ref 70–99)
Glucose-Capillary: 175 mg/dL — ABNORMAL HIGH (ref 70–99)
Glucose-Capillary: 60 mg/dL — ABNORMAL LOW (ref 70–99)
Glucose-Capillary: 70 mg/dL (ref 70–99)
Glucose-Capillary: 73 mg/dL (ref 70–99)
Glucose-Capillary: 73 mg/dL (ref 70–99)

## 2023-02-14 LAB — CBC
HCT: 30.5 % — ABNORMAL LOW (ref 36.0–46.0)
Hemoglobin: 10.3 g/dL — ABNORMAL LOW (ref 12.0–15.0)
MCH: 30.6 pg (ref 26.0–34.0)
MCHC: 33.8 g/dL (ref 30.0–36.0)
MCV: 90.5 fL (ref 80.0–100.0)
Platelets: 118 10*3/uL — ABNORMAL LOW (ref 150–400)
RBC: 3.37 MIL/uL — ABNORMAL LOW (ref 3.87–5.11)
RDW: 14.6 % (ref 11.5–15.5)
WBC: 6.6 10*3/uL (ref 4.0–10.5)
nRBC: 0 % (ref 0.0–0.2)

## 2023-02-14 LAB — PROTIME-INR
INR: 1.1 (ref 0.8–1.2)
Prothrombin Time: 14.4 seconds (ref 11.4–15.2)

## 2023-02-14 LAB — CULTURE, BLOOD (ROUTINE X 2)
Culture: NO GROWTH
Culture: NO GROWTH

## 2023-02-14 LAB — AEROBIC/ANAEROBIC CULTURE W GRAM STAIN (SURGICAL/DEEP WOUND)

## 2023-02-14 LAB — BLOOD GAS, VENOUS
Acid-Base Excess: 6.9 mmol/L — ABNORMAL HIGH (ref 0.0–2.0)
Bicarbonate: 33 mmol/L — ABNORMAL HIGH (ref 20.0–28.0)
Drawn by: 68626
O2 Saturation: 28.7 %
Patient temperature: 36.6
pCO2, Ven: 51 mmHg (ref 44–60)
pH, Ven: 7.42 (ref 7.25–7.43)
pO2, Ven: <31 mmHg — CL (ref 32–45)

## 2023-02-14 MED ORDER — SODIUM CHLORIDE 0.9 % IV SOLN
INTRAVENOUS | Status: AC | PRN
  Administered 2023-02-14: 50 mL/h via INTRAVENOUS

## 2023-02-14 MED ORDER — ACETAMINOPHEN 325 MG PO TABS
650.0000 mg | ORAL_TABLET | Freq: Three times a day (TID) | ORAL | Status: DC | PRN
  Administered 2023-02-15 (×2): 650 mg via ORAL
  Filled 2023-02-14 (×3): qty 2

## 2023-02-14 MED ORDER — FENTANYL CITRATE (PF) 100 MCG/2ML IJ SOLN
INTRAMUSCULAR | Status: AC | PRN
  Administered 2023-02-14: 50 ug via INTRAVENOUS

## 2023-02-14 MED ORDER — FENTANYL CITRATE (PF) 100 MCG/2ML IJ SOLN
INTRAMUSCULAR | Status: AC
  Filled 2023-02-14: qty 2

## 2023-02-14 MED ORDER — HYDROCODONE-ACETAMINOPHEN 5-325 MG PO TABS
1.0000 | ORAL_TABLET | Freq: Four times a day (QID) | ORAL | Status: DC
  Administered 2023-02-14 – 2023-02-16 (×9): 1 via ORAL
  Filled 2023-02-14 (×9): qty 1

## 2023-02-14 MED ORDER — OXYCODONE HCL 5 MG PO TABS
2.5000 mg | ORAL_TABLET | Freq: Once | ORAL | Status: AC
  Administered 2023-02-14: 2.5 mg via ORAL
  Filled 2023-02-14: qty 1

## 2023-02-14 MED ORDER — LIDOCAINE 5 % EX PTCH: 1.0000 | MEDICATED_PATCH | CUTANEOUS | Status: DC

## 2023-02-14 MED ORDER — BUPIVACAINE HCL (PF) 0.25 % IJ SOLN
INTRAMUSCULAR | Status: AC
  Filled 2023-02-14: qty 30

## 2023-02-14 MED ORDER — HYDROCODONE-ACETAMINOPHEN 5-325 MG PO TABS: 1.0000 | ORAL_TABLET | Freq: Four times a day (QID) | ORAL | Status: DC | PRN

## 2023-02-14 MED ORDER — DEXTROSE 50 % IV SOLN
INTRAVENOUS | Status: AC
  Administered 2023-02-14: 50 mL
  Filled 2023-02-14: qty 50

## 2023-02-14 MED ORDER — SODIUM CHLORIDE 0.9 % IV SOLN: INTRAVENOUS | Status: AC

## 2023-02-14 MED ORDER — MIDAZOLAM HCL 2 MG/2ML IJ SOLN
INTRAMUSCULAR | Status: AC
  Filled 2023-02-14: qty 2

## 2023-02-14 MED ORDER — DEXTROSE 50 % IV SOLN: 1.0000 | Freq: Once | INTRAVENOUS | Status: DC

## 2023-02-14 MED ORDER — MIDAZOLAM HCL 2 MG/2ML IJ SOLN
INTRAMUSCULAR | Status: AC | PRN
  Administered 2023-02-14: 1 mg via INTRAVENOUS

## 2023-02-14 MED ORDER — HEPARIN SODIUM (PORCINE) 5000 UNIT/ML IJ SOLN
5000.0000 [IU] | Freq: Three times a day (TID) | INTRAMUSCULAR | Status: DC
  Administered 2023-02-15 – 2023-02-16 (×4): 5000 [IU] via SUBCUTANEOUS
  Filled 2023-02-14 (×4): qty 1

## 2023-02-14 MED ORDER — LIDOCAINE HCL (PF) 1 % IJ SOLN
INTRAMUSCULAR | Status: AC
  Filled 2023-02-14: qty 30

## 2023-02-14 NOTE — Progress Notes (Signed)
Reviewed patient's MRI results. She will likely need stabilization of T11-T12. She was taken off of vancomycin last time because of AKI. She will need IV abx followed by oral abx. We will get CT thoracic and lumbar spine for surgical planning.

## 2023-02-14 NOTE — Sedation Documentation (Signed)
Patient is resting comfortably. 

## 2023-02-14 NOTE — Progress Notes (Addendum)
[facility]  Date of Admission:  02/13/2023     Total days of antibiotics 0         ASSESSMENT:  Katelyn [name] continues to have lower back pain with radiculopathy down the left side. Awaiting Neurosurgery evaluation for possible spinal cord stimulator extraction and IR has performed aspiration with cultures pending. Continue to hold antibiotics pending Neurosurgery evaluation and potential for additional cultures. Stimulator remains a concern for nidus of infection. Blood cultures remain without growth to date. Recommendations discussed with Katelyn [name] and her husband at bedside. Remaining medical and supportive care per [facility] Medicine Team.   PLAN:  Continue to hold antibiotics.  Monitor blood and aspiration cultures. Recommend Neurosurgery evaluation for consideration of extraction of stimulator.  Remaining medical and supportive care per family medicine.   I have personally spent 25 minutes involved in face-to-face and non-face-to-face activities for this patient on the day of the visit. Professional time spent includes the following activities: Preparing to see the patient (review of tests), Obtaining and/or reviewing separately obtained history (admission/discharge record), Performing a medically appropriate examination and/or evaluation , Ordering medications/tests/procedures, referring and communicating with other health care professionals, Documenting clinical information in the EMR, Independently interpreting results (not separately reported), Communicating results to the patient/family/caregiver, Counseling and educating the patient/family/caregiver and Care coordination (not separately reported).   Principal Problem:   Diskitis Active Problems:   CKD stage 3b, GFR 30-44 ml/min (HCC)   Anemia of chronic disease   Discitis   Malfunction of spinal cord stimulator (HCC)   Serratia   Somnolence   Diabetes mellitus without complication (HCC)    allopurinol   100 mg Oral QHS   carvedilol  6.25 mg Oral BID   DULoxetine  60 mg Oral QHS   [START ON 02/15/2023] heparin injection (subcutaneous)  5,000 Units Subcutaneous Q8H   HYDROcodone-acetaminophen  1 tablet Oral Q6H   insulin aspart  0-9 Units Subcutaneous TID WC   mirtazapine  30 mg Oral QHS   pantoprazole  40 mg Oral Daily   pramipexole  1 mg Oral QHS   pregabalin  75 mg Oral QHS    SUBJECTIVE:  Afebrile overnight with no acute events. Lumbar back pain with radiculopathy down the left side.   Allergies  Allergen Reactions   Atorvastatin Nausea And Vomiting and Other (See Comments)    MYALGIAS   Talwin [Pentazocine] Other (See Comments)    headache   Other Nausea Only    UNSPECIFIED Anesthesia     Review of Systems: Review of Systems  Constitutional:  Negative for chills, fever and weight loss.  Respiratory:  Negative for cough, shortness of breath and wheezing.   Cardiovascular:  Negative for chest pain and leg swelling.  Gastrointestinal:  Negative for abdominal pain, constipation, diarrhea, nausea and vomiting.  Musculoskeletal:  Positive for back pain.  Skin:  Negative for rash.      OBJECTIVE: Vitals:   02/14/23 1251 02/14/23 1256 02/14/23 1301 02/14/23 1309  BP: (!) 129/58 (!) 113/57 (!) 104/48 (!) 110/49  Pulse: 67 66 70 69  Resp: 12 10 12 11   Temp:      TempSrc:      SpO2: 100% 100% 100% 100%  Weight:      Height:       Body mass index is 34.4 kg/m.  Physical Exam Constitutional:      General: She is not in acute distress.    Appearance: She is well-developed.  Cardiovascular:  Rate and Rhythm: Normal rate and regular rhythm.     Heart sounds: Normal heart sounds.  Pulmonary:     Effort: Pulmonary effort is normal.     Breath sounds: Normal breath sounds.  Skin:    General: Skin is warm and dry.  Neurological:     Mental Status: She is alert.     Lab Results Lab Results  Component Value Date   WBC 6.6 02/14/2023   HGB 10.3 (L) 02/14/2023    HCT 30.5 (L) 02/14/2023   MCV 90.5 02/14/2023   PLT 118 (L) 02/14/2023    Lab Results  Component Value Date   CREATININE 1.88 (H) 02/14/2023   BUN 51 (H) 02/14/2023   NA 134 (L) 02/14/2023   K 4.2 02/14/2023   CL 95 (L) 02/14/2023   CO2 29 02/14/2023    Lab Results  Component Value Date   ALT 25 12/06/2022   AST 34 12/06/2022   ALKPHOS 153 (H) 12/06/2022   BILITOT 0.6 12/06/2022     Microbiology: Recent Results (from the past 240 hour(s))  Blood culture (routine x 2)     Status: None (Preliminary result)   Collection Time: 02/13/23  7:44 PM   Specimen: BLOOD  Result Value Ref Range Status   Specimen Description BLOOD SITE NOT SPECIFIED  Final   Special Requests   Final    BOTTLES DRAWN AEROBIC AND ANAEROBIC Blood Culture results may not be optimal due to an excessive volume of blood received in culture bottles   Culture   Final    NO GROWTH < 12 HOURS Performed at [facility], [address] [place], [place] [address]    Report Status PENDING  Incomplete  Blood culture (routine x 2)     Status: None (Preliminary result)   Collection Time: 02/13/23  7:54 PM   Specimen: BLOOD  Result Value Ref Range Status   Specimen Description BLOOD SITE NOT SPECIFIED  Final   Special Requests   Final    BOTTLES DRAWN AEROBIC AND ANAEROBIC Blood Culture results may not be optimal due to an excessive volume of blood received in culture bottles   Culture   Final    NO GROWTH < 12 HOURS Performed at [facility], [address] [place], [place] [address]    Report Status PENDING  Incomplete     Marcos Eke, NP [facility]  02/14/2023  1:37 PM

## 2023-02-14 NOTE — Procedures (Signed)
INTERVENTIONAL NEURORADIOLOGY BRIEF POSTPROCEDURE NOTE  Fluoroscopy guided T11-12 disc aspiration.  Attending: Dr. Baldemar Lenis  Diagnosis: Discitis/osteomyelitis  Access site: Percutaneous  Anesthesia: Moderate sedation  Medication used: 1 mg Versed IV; 50 mcg Fentanyl IV.  Complications: None  Estimated blood loss: None  Specimen: 2 FNA samples  Findings: Erosion of the T11 and T12 endplates. Left posterolateral approach utilized for T11-12 disc aspiration. 2 bood tinged samples were obtained.  The patient tolerated the procedure well without incident or complication and is in stable condition.

## 2023-02-14 NOTE — Plan of Care (Signed)

## 2023-02-14 NOTE — Assessment & Plan Note (Signed)
Hgb 10.3, baseline Hgb ~10. Likely in the setting of CKD

## 2023-02-14 NOTE — Consult Note (Signed)
Chief Complaint: Patient was seen in consultation today for severe back pain; Thoracic 11-12 disc aspiration Chief Complaint  Patient presents with   Back Pain   Numbness   at the request of Dr Daiva Eves   Supervising Physician: Joana Reamer Kizzie Fantasia  Patient Status: [facility] - In-pt  History of Present Illness: Katelyn [name] is a 67 y.o. female   Hx previous spinal post op infection  Was treated for discitis/osteomyelitis/epidural phlegmon Feb 2024--- Vanco and Cefepime-- caused renal injury  Follow up MRI April revealed continued diskitis and was treated with Doxycycline and Cipro  Has spinal cord stimulator - placed Dec 2023- but no real help Persistent back pain  MRI yesterday:  IMPRESSION: 1. Persistent and progressed discitis/osteomyelitis at T11-T12 with worsening endplate destruction and loss of vertebral body height at both levels, worse at T12. 2. Epidural phlegmon is similar to the prior study without discrete abscess. Unchanged moderate spinal canal stenosis and severe bilateral neural foraminal stenosis at T11-T12. 3. Probable associated right-sided septic arthritis at T11-T12, similar to the prior study. No convincing left-sided septic arthritis. 4. No other evidence of infection in the thoracic or lumbar spine. 5. Postsurgical changes reflecting posterior instrumented fusion from L2 through L5 without residual significant spinal canal or neural foraminal stenosis at these levels. 6. 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 7. Disc bulge eccentric to the left at L5-S1 resulting in mild right worse than left subarticular zone narrowing without evidence of frank nerve root impingement, and mild-to-moderate left and mild right neural foraminal stenosis, unchanged.  Request made for T11-12 disc aspiration per ID Dr Quay Burow has reviewed imaging and approves  procedure    Past Medical History:  Diagnosis Date   Anxiety    Arthritis    Bursitis of right hip    CAD (coronary artery disease)    Cardiac catheterization June 2014 in [place] - 50% circumflex stenosis   Chest pain, neg MI, stable CAD non obstructive on cath 10/05/20 10/04/2020   Chronic diastolic heart failure (HCC) 08/20/2017   Chronic kidney disease, stage 3 (HCC)    does not see nephrologist   Chronic low back pain without sciatica 03/14/2016   CKD (chronic kidney disease), stage III (HCC) 10/07/2020   Complication of anesthesia    Cough 04/19/2017   Overview:  Last Assessment & Plan:  Formatting of this note may be different from the original. Cough - ? ACE related with AR triggers   Plan  Patient Instructions  Discuss with your primary doctor that lisinopril pain, need making your cough worse. May use Mucinex DM twice daily as needed for cough and congestion Zyrtec 10 mg at bedtime as needed for drainage Saline nasal spray as needed. Lab tests today Activity as tolerated. Follow with Dr. Craige Cotta in 3-4 months and As needed   Please contact office for sooner follow up if symptoms do not improve or worsen or seek emergency care    Depression    Dyspnea    with exertion   " lazy lung" - per  Dr Craige Cotta from back issues- 06/2016   Elevated liver enzymes 12/05/2016   Essential hypertension    GERD (gastroesophageal reflux disease)    Gout 03/14/2016   Greater trochanteric bursitis of right hip 02/02/2012   H/O hiatal hernia    Heart murmur    History of blood transfusion 2016   History of esophageal stricture 10/07/2020  History of kidney stones    Hypercholesterolemia    Hypertensive heart disease with heart failure (HCC) 01/01/2017   Hypoxia 10/07/2020   Iliotibial band syndrome of right side 02/02/2012   Iron deficiency anemia due to chronic blood loss 03/14/2016   LBBB (left bundle branch block) 01/01/2017   Left bundle branch block    Leg weakness 10/07/2020   Lumbar stenosis     Meralgia paraesthetica 12/05/2016   Mild CAD 11/24/2015   Morbid obesity (HCC) 10/07/2020   Neuropathy    OSA (obstructive sleep apnea) 05/24/2016   Overview:  Managed PULM   PONV (postoperative nausea and vomiting)    "no N/V with patch"   Restless leg syndrome    S/P lumbar laminectomy 11/26/2015   S/P lumbar spinal fusion 08/29/2016   Tinnitus 12/05/2016   Type 2 diabetes mellitus (HCC)    UTI (urinary tract infection) 10/07/2020    Past Surgical History:  Procedure Laterality Date   ABDOMINAL HYSTERECTOMY  1983   APPENDECTOMY  Age 77   BACK SURGERY     FIRST LUMBAR FUSION/ SURGERY APRIL 2012 AND FUSION WITH INSTRUMENTATION SEPT 2012   CARDIAC CATHETERIZATION     x 2   CARPAL TUNNEL RELEASE     bil   CHOLECYSTECTOMY  1990's   COLONOSCOPY  12/12/2017   Colonic polyp status post polypectomy. Mild sigmoid diverticulosis. Otherwise normal colonoscopy to terminal ileum.   CYSTO EXTRACTION KIDNEY STONES     ESOPHAGOGASTRODUODENOSCOPY  12/12/2017   Small hiatal hernia. Mild gastritis. Status post esophageal dilatation.   EXCISION/RELEASE BURSA HIP  02/02/2012   Procedure: EXCISION/RELEASE BURSA HIP;  Surgeon: Jacki Cones, MD;  Location: [facility] ORS;  Service: Orthopedics;  Laterality: Right;  Right Hip Bursectomy   EYE SURGERY Bilateral    cataracts   KIDNEY STONE SURGERY  2008   Left knee surgery x 2  1996   reconstruction   LUMBAR DISC SURGERY  08/2016   LUMBAR LAMINECTOMY/DECOMPRESSION MICRODISCECTOMY Right 11/26/2015   Procedure: Extraforaminal Microdiscectomy  - Lumbar two-three- right;  Surgeon: Tia Alert, MD;  Location: [facility];  Service: Neurosurgery;  Laterality: Right;  right    LUMBAR WOUND DEBRIDEMENT N/A 10/25/2016   Procedure: Lumbar wound revision;  Surgeon: Tia Alert, MD;  Location: [facility] OR;  Service: Neurosurgery;  Laterality: N/A;  Lumbar wound revision   Right shoulder surgery  2010   spur   RIGHT/LEFT HEART CATH AND CORONARY ANGIOGRAPHY N/A  10/05/2020   Procedure: RIGHT/LEFT HEART CATH AND CORONARY ANGIOGRAPHY;  Surgeon: [place], Peter M, MD;  Location: [facility] INVASIVE CV LAB;  Service: Cardiovascular;  Laterality: N/A;    Allergies: Atorvastatin, Talwin [pentazocine], and Other  Medications: Prior to Admission medications   Medication Sig Start Date End Date Taking? Authorizing Provider  albuterol (VENTOLIN HFA) 108 (90 Base) MCG/ACT inhaler Inhale 2 puffs into the lungs every 6 (six) hours as needed for wheezing or shortness of breath. 02/05/23   Cobb, Ruby Cola, NP  allopurinol (ZYLOPRIM) 100 MG tablet Take 100 mg by mouth at bedtime. 10/20/19   [provider]  aspirin EC 81 MG tablet Take 81 mg by mouth daily. **presently on hold for surgery**    [provider]  carvedilol (COREG) 6.25 MG tablet Take 1 tablet (6.25 mg total) by mouth 2 (two) times daily. Take 6.25 mg by mouth 2 (two) times daily. / Needs appointment for further refills 04/27/21   Baldo Daub, MD  ciprofloxacin (CIPRO) 500 MG tablet  Take 1 tablet (500 mg total) by mouth 2 (two) times daily. 02/09/23 05/10/23  Kathlynn Grate, DO  cyclobenzaprine (FLEXERIL) 5 MG tablet Take 5 mg by mouth 3 (three) times daily as needed for muscle spasms. 06/01/22   [provider]  Docusate Sodium (DSS) 100 MG CAPS 100 mg. 01/12/23   [provider]  doxycycline (VIBRA-TABS) 100 MG tablet Take 1 tablet (100 mg total) by mouth 2 (two) times daily. 02/09/23 05/10/23  Kathlynn Grate, DO  DULoxetine (CYMBALTA) 60 MG capsule Take 60 mg by mouth at bedtime.     [provider]  FARXIGA 5 MG TABS tablet Take by mouth. 01/20/23   [provider]  HYDROcodone-acetaminophen (NORCO/VICODIN) 5-325 MG tablet Take 1 tablet by mouth every 6 (six) hours as needed for moderate pain. 12/11/22 12/11/23  [place], Alvira Philips, DO  metFORMIN (GLUCOPHAGE-XR) 500 MG 24 hr tablet Take 500 mg by mouth 2 (two) times daily. 11/11/21   [provider]   mirtazapine (REMERON) 15 MG tablet Take 30 mg by mouth at bedtime.    [provider]  MOUNJARO 5 MG/0.5ML Pen Inject 5 mg into the skin once a week. 05/17/22   [provider]  Multiple Vitamin (MULTIVITAMIN WITH MINERALS) TABS tablet Take 1 tablet by mouth daily.    [provider]  Multiple Vitamins-Minerals (HAIR SKIN AND NAILS FORMULA) TABS Take 1 tablet by mouth daily. 10/17/18   [provider]  nitroGLYCERIN (NITROSTAT) 0.4 MG SL tablet Place 0.4 mg under the tongue every 5 (five) minutes as needed for chest pain.     [provider]  NOVOLOG FLEXPEN 100 UNIT/ML FlexPen Inject 55 Units into the skin 3 (three) times daily with meals as needed for high blood sugar. As needed for blood sugar over 150. Add 1 unit for every point over 150 11/15/21   [provider]  pantoprazole (PROTONIX) 40 MG tablet Take 1 tablet by mouth once daily 07/31/22   Baldo Daub, MD  pramipexole (MIRAPEX) 1 MG tablet Take 1 mg by mouth at bedtime.    [provider]  pregabalin (LYRICA) 75 MG capsule Take 75 mg by mouth at bedtime. 09/24/21   [provider]  torsemide (DEMADEX) 20 MG tablet Take 1 tablet (20 mg total) by mouth 2 (two) times daily. 01/08/23   Baldo Daub, MD  TRESIBA FLEXTOUCH 200 UNIT/ML FlexTouch Pen Inject 110 Units into the skin at bedtime. 11/15/21   [provider]  valACYclovir (VALTREX) 1000 MG tablet Take 1,000 mg by mouth daily as needed (fever blisters).    [provider]     Family History  Problem Relation Age of Onset   Lung cancer Father        smoked   Hypertension Father    Stroke Father    Heart failure Mother    Bone cancer Sister    Asthma Sister     Social History   Socioeconomic History   Marital status: Married    Spouse name: Not on file   Number of children: Not on file   Years of education: Not on file   Highest education level: Not on file  Occupational History    Occupation: Librarian, academic  Tobacco Use   Smoking status: Never   Smokeless tobacco: Never   Tobacco comments:    Prior secondhand smoke  Vaping Use   Vaping Use: Never used  Substance and Sexual Activity   Alcohol use:  Yes    Alcohol/week: 0.0 standard drinks of alcohol    Comment: Rarely   Drug use: No   Sexual activity: Not on file  Other Topics Concern   Not on file  Social History Narrative   Not on file   Social Determinants of Health   Financial Resource Strain: Not on file  Food Insecurity: No Food Insecurity (02/13/2023)   Hunger Vital Sign    Worried About Running Out of Food in the Last Year: Never true    Ran Out of Food in the Last Year: Never true  Transportation Needs: No Transportation Needs (02/13/2023)   PRAPARE - Administrator, Civil Service (Medical): No    Lack of Transportation (Non-Medical): No  Physical Activity: Not on file  Stress: Not on file  Social Connections: Not on file    Review of Systems: A 12 point ROS discussed and pertinent positives are indicated in the HPI above.  All other systems are negative.  Review of Systems  Constitutional:  Positive for activity change. Negative for fatigue and fever.  Respiratory:  Negative for cough and shortness of breath.   Cardiovascular:  Negative for chest pain.  Gastrointestinal:  Negative for abdominal pain.  Musculoskeletal:  Positive for back pain and gait problem.  Neurological:  Positive for weakness.  Psychiatric/Behavioral:  Negative for behavioral problems and confusion.     Vital Signs: BP (!) 123/49 (BP Location: Right Arm)   Pulse 69   Temp 99.3 F (37.4 C) (Oral)   Resp 18   Ht 5\' 2"  (1.575 m)   Wt 188 lb 0.8 oz (85.3 kg)   SpO2 96%   BMI 34.40 kg/m    Physical Exam Vitals reviewed.  HENT:     Mouth/Throat:     Mouth: Mucous membranes are moist.  Cardiovascular:     Rate and Rhythm: Normal rate and regular rhythm.     Heart sounds: Normal heart  sounds.  Pulmonary:     Effort: Pulmonary effort is normal.     Breath sounds: Normal breath sounds.  Abdominal:     Palpations: Abdomen is soft.  Musculoskeletal:        General: Tenderness present. No swelling. Normal range of motion.     Comments: Low back pain  Skin:    General: Skin is warm.  Neurological:     Mental Status: She is alert and oriented to person, place, and time.  Psychiatric:        Behavior: Behavior normal.     Imaging: CT HEAD WO CONTRAST ( )  Result Date: 02/13/2023 CLINICAL DATA:  Altered mental status. EXAM: CT HEAD WITHOUT CONTRAST TECHNIQUE: Contiguous axial images were obtained from the base of the skull through the vertex without intravenous contrast. RADIATION DOSE REDUCTION: This exam was performed according to the departmental dose-optimization program which includes automated exposure control, adjustment of the mA and/or kV according to patient size and/or use of iterative reconstruction technique. COMPARISON:  01/11/2023 FINDINGS: Brain: No evidence of intracranial hemorrhage, acute infarction, hydrocephalus, extra-axial collection, or mass lesion/mass effect. Vascular:  No hyperdense vessel or other acute findings. Skull: No evidence of fracture or other significant bone abnormality. Sinuses/Orbits:  No acute findings. Other: None. IMPRESSION: Negative noncontrast head CT. Electronically Signed   By: Danae Orleans M.D.   On: 02/13/2023 18:53   MR Lumbar Spine W Wo Contrast  Result Date: 02/13/2023 CLINICAL DATA:  Mid back pain.  History of osteomyelitis. EXAM: MRI THORACIC AND LUMBAR  SPINE WITHOUT AND WITH CONTRAST TECHNIQUE: Multiplanar and multiecho pulse sequences of the thoracic and lumbar spine were obtained without and with intravenous contrast. CONTRAST:  8mL GADAVIST GADOBUTROL 1 MMOL/ML IV SOLN COMPARISON:  Thoracic spine MRI 12/05/2022. FINDINGS: MRI THORACIC SPINE FINDINGS Alignment: There is grade 1 retrolisthesis of T11 on T12, new/increased  since the prior study. Vertebrae: Background marrow signal is normal. There is confluent T1 hypointensity with T2/STIR hyperintensity and enhancement in the T11 and T12 vertebral bodies consistent with discitis/osteomyelitis. Compared to the study from 12/05/2022, endplate destruction has worsened, with progressed loss of vertebral body height at both levels, worse at T12. There is suspected septic arthritis on the right at T11-T12, similar to the prior study (4-6). There is no convincing left septic arthritis. There is both dorsal and ventral epidural enhancement in the spinal canal centered at T11-T12 measuring up to 4 mm in thickness dorsally at T12 consistent with epidural phlegmon resulting in moderate spinal canal stenosis and severe bilateral neural foraminal stenosis at T11-T12. The dorsal phlegmon extends superiorly to the T8 level. There is no discrete abscess. There is no other evidence of infection. Background marrow signal is normal. Vertebral body heights are otherwise preserved. There enhancement along the T5-T6 and T6-T7 disc spaces without corresponding T1 hypointensity or edema, favored degenerative in nature. Cord: Normal in signal and morphology. A spinal stimulator is noted with the leads terminating at the T6 level. There is no significant enhancement around the distal end of the leads. Paraspinal and other soft tissues: There is edema in the paraspinal soft tissues at T11-T12 without evidence of organized paraspinal abscess. Disc levels: As above, there is moderate spinal canal stenosis and severe bilateral neural foraminal stenosis at T11-T12 and severe bilateral neural foraminal stenosis at T11-T12 due to the infection and epidural phlegmon. Multilevel degenerative changes throughout the remainder of the thoracic spine with scattered disc herniations are unchanged compared to the recent prior study. There is no other significant spinal canal stenosis. There is no evidence of high-grade  neural foraminal stenosis. MRI LUMBAR SPINE FINDINGS Segmentation: Standard; the lowest formed disc space is designated L5-S1. Alignment: There is levocurvature centered at L3. There is no significant antero or retrolisthesis. There is straightening of the normal lumbar lordosis. Vertebrae: Postsurgical changes reflecting posterior instrumented fusion from L2 through L5 are noted. Background marrow signal is normal. There is no suspicious marrow signal abnormality or marrow edema. There is no evidence of discitis/osteomyelitis in the lumbar spine. Conus medullaris: Extends to the L1 level and appears normal. There is no definite abnormal enhancement of the conus or cauda equina nerve roots. Paraspinal and other soft tissues: Unremarkable. Disc levels: There is a mild disc bulge and mild facet arthropathy resulting mild-to-moderate spinal canal stenosis without significant neural foraminal stenosis L1-L2: There is marked disc degeneration with endplate spurring and bilateral facet arthropathy resulting in mild spinal canal stenosis and mild left and no significant right neural foraminal stenosis, unchanged since the prior MRI from 2022. L2-L3: Status post posterior instrumented fusion without significant residual spinal canal or neural foraminal stenosis L3-L4: Status post posterior instrumented fusion without residual significant spinal canal or neural foraminal stenosis L4-L5: Status post posterior instrumented fusion without residual significant spinal canal or neural foraminal stenosis L5-S1: There is disc bulge eccentric to the left, left worse than right endplate spurring, and bilateral facet arthropathy resulting in mild right worse than left subarticular zone narrowing without evidence of frank nerve root impingement, and mild-to-moderate left and mild right  neural foraminal stenosis. Findings are unchanged. IMPRESSION: 1. Persistent and progressed discitis/osteomyelitis at T11-T12 with worsening endplate  destruction and loss of vertebral body height at both levels, worse at T12. 2. Epidural phlegmon is similar to the prior study without discrete abscess. Unchanged moderate spinal canal stenosis and severe bilateral neural foraminal stenosis at T11-T12. 3. Probable associated right-sided septic arthritis at T11-T12, similar to the prior study. No convincing left-sided septic arthritis. 4. No other evidence of infection in the thoracic or lumbar spine. 5. Postsurgical changes reflecting posterior instrumented fusion from L2 through L5 without residual significant spinal canal or neural foraminal stenosis at these levels. 6. 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 7. Disc bulge eccentric to the left at L5-S1 resulting in mild right worse than left subarticular zone narrowing without evidence of frank nerve root impingement, and mild-to-moderate left and mild right neural foraminal stenosis, unchanged. Electronically Signed   By: Lesia Hausen M.D.   On: 02/13/2023 14:44   MR THORACIC SPINE W WO CONTRAST  Result Date: 02/13/2023 CLINICAL DATA:  Mid back pain.  History of osteomyelitis. EXAM: MRI THORACIC AND LUMBAR SPINE WITHOUT AND WITH CONTRAST TECHNIQUE: Multiplanar and multiecho pulse sequences of the thoracic and lumbar spine were obtained without and with intravenous contrast. CONTRAST:  8mL GADAVIST GADOBUTROL 1 MMOL/ML IV SOLN COMPARISON:  Thoracic spine MRI 12/05/2022. FINDINGS: MRI THORACIC SPINE FINDINGS Alignment: There is grade 1 retrolisthesis of T11 on T12, new/increased since the prior study. Vertebrae: Background marrow signal is normal. There is confluent T1 hypointensity with T2/STIR hyperintensity and enhancement in the T11 and T12 vertebral bodies consistent with discitis/osteomyelitis. Compared to the study from 12/05/2022, endplate destruction has worsened, with progressed loss of vertebral body height at both  levels, worse at T12. There is suspected septic arthritis on the right at T11-T12, similar to the prior study (4-6). There is no convincing left septic arthritis. There is both dorsal and ventral epidural enhancement in the spinal canal centered at T11-T12 measuring up to 4 mm in thickness dorsally at T12 consistent with epidural phlegmon resulting in moderate spinal canal stenosis and severe bilateral neural foraminal stenosis at T11-T12. The dorsal phlegmon extends superiorly to the T8 level. There is no discrete abscess. There is no other evidence of infection. Background marrow signal is normal. Vertebral body heights are otherwise preserved. There enhancement along the T5-T6 and T6-T7 disc spaces without corresponding T1 hypointensity or edema, favored degenerative in nature. Cord: Normal in signal and morphology. A spinal stimulator is noted with the leads terminating at the T6 level. There is no significant enhancement around the distal end of the leads. Paraspinal and other soft tissues: There is edema in the paraspinal soft tissues at T11-T12 without evidence of organized paraspinal abscess. Disc levels: As above, there is moderate spinal canal stenosis and severe bilateral neural foraminal stenosis at T11-T12 and severe bilateral neural foraminal stenosis at T11-T12 due to the infection and epidural phlegmon. Multilevel degenerative changes throughout the remainder of the thoracic spine with scattered disc herniations are unchanged compared to the recent prior study. There is no other significant spinal canal stenosis. There is no evidence of high-grade neural foraminal stenosis. MRI LUMBAR SPINE FINDINGS Segmentation: Standard; the lowest formed disc space is designated L5-S1. Alignment: There is levocurvature centered at L3. There is no significant antero or retrolisthesis. There is straightening of the normal lumbar lordosis. Vertebrae: Postsurgical changes reflecting posterior instrumented fusion from  L2 through L5 are noted. Background marrow signal is normal. There is no suspicious marrow signal abnormality or marrow edema. There is no evidence of discitis/osteomyelitis in the lumbar spine. Conus medullaris: Extends to the L1 level and appears normal. There is no definite abnormal enhancement of the conus or cauda equina nerve roots. Paraspinal and other soft tissues: Unremarkable. Disc levels: There is a mild disc bulge and mild facet arthropathy resulting mild-to-moderate spinal canal stenosis without significant neural foraminal stenosis L1-L2: There is marked disc degeneration with endplate spurring and bilateral facet arthropathy resulting in mild spinal canal stenosis and mild left and no significant right neural foraminal stenosis, unchanged since the prior MRI from 2022. L2-L3: Status post posterior instrumented fusion without significant residual spinal canal or neural foraminal stenosis L3-L4: Status post posterior instrumented fusion without residual significant spinal canal or neural foraminal stenosis L4-L5: Status post posterior instrumented fusion without residual significant spinal canal or neural foraminal stenosis L5-S1: There is disc bulge eccentric to the left, left worse than right endplate spurring, and bilateral facet arthropathy resulting in mild right worse than left subarticular zone narrowing without evidence of frank nerve root impingement, and mild-to-moderate left and mild right neural foraminal stenosis. Findings are unchanged. IMPRESSION: 1. Persistent and progressed discitis/osteomyelitis at T11-T12 with worsening endplate destruction and loss of vertebral body height at both levels, worse at T12. 2. Epidural phlegmon is similar to the prior study without discrete abscess. Unchanged moderate spinal canal stenosis and severe bilateral neural foraminal stenosis at T11-T12. 3. Probable associated right-sided septic arthritis at T11-T12, similar to the prior study. No convincing  left-sided septic arthritis. 4. No other evidence of infection in the thoracic or lumbar spine. 5. Postsurgical changes reflecting posterior instrumented fusion from L2 through L5 without residual significant spinal canal or neural foraminal stenosis at these levels. 6. 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 7. Disc bulge eccentric to the left at L5-S1 resulting in mild right worse than left subarticular zone narrowing without evidence of frank nerve root impingement, and mild-to-moderate left and mild right neural foraminal stenosis, unchanged. Electronically Signed   By: Lesia Hausen M.D.   On: 02/13/2023 14:44    Labs:  CBC: Recent Labs    12/10/22 0746 02/08/23 0222 02/13/23 1040 02/13/23 2026 02/14/23 0025  WBC 6.3 6.4 5.0  --  6.6  HGB 10.3* 10.5* 9.9* 9.9* 10.3*  HCT 31.6* 32.0* 29.2* 29.0* 30.5*  PLT 142* 144 120*  --  118*    COAGS: Recent Labs    12/05/22 1807  INR 1.0    BMP: Recent Labs    12/09/22 0640 12/10/22 0746 02/08/23 0222 02/13/23 1040 02/13/23 1944 02/13/23 2026 02/14/23 0025  NA 135 136 136 130*  --  137 134*  K 3.9 3.6 3.8 4.0  --  3.6 4.2  CL 95* 98 94* 93*  --   --  95*  CO2 32 31 31 25   --   --  29  GLUCOSE 102* 136* 66 191* 50*  --  88  BUN 20 18 45* 51*  --   --  51*  CALCIUM 8.8* 8.7* 9.5 9.4  --   --  9.2  CREATININE 1.01* 1.07* 1.76* 1.98*  --   --  1.88*  GFRNONAA >60 58*  --  28*  --   --  29*    LIVER FUNCTION TESTS: Recent Labs    12/05/22 1807 12/06/22  0710  BILITOT 0.6 0.6  AST 46* 34  ALT 32 25  ALKPHOS 179* 153*  PROT 7.2 6.6  ALBUMIN 3.1* 2.8*    TUMOR MARKERS: No results for input(s): "AFPTM", "CEA", "CA199", "CHROMGRNA" in the last 8760 hours.  Assessment and Plan:  Scheduled now for T11-12 disc aspiration in IR Risks and benefits of Thoracic 11-12 disc aspiration was discussed with the patient and/or patient's family  including, but not limited to bleeding, infection, damage to adjacent structures or low yield requiring additional tests.  All of the questions were answered and there is agreement to proceed.  Consent signed and in chart.  Thank you for this interesting consult.  I greatly enjoyed meeting Katelyn [name] and look forward to participating in their care.  A copy of this report was sent to the requesting provider on this date.  Electronically Signed: Robet Leu, PA-C 02/14/2023, 10:18 AM   I spent a total of 20 Minutes    in face to face in clinical consultation, greater than 50% of which was counseling/coordinating care for T11-12 disc aspiration

## 2023-02-14 NOTE — TOC Initial Note (Signed)
Transition of Care [facility]) - Initial/Assessment Note    Patient Details  Name: Katelyn [name] MRN: [id number] Date of Birth: 08-13-1956  Transition of Care [facility]) CM/SW Contact:    Kermit Balo, RN Phone Number: 02/14/2023, 11:21 AM  Clinical Narrative:                 Pt is from home with her spouse. They are together most of the time.  Spouse provides needed transportation. Both manage her medications and deny any issues. Pharmacy: [facility] in [place] Pt recently finished 6 weeks of IV abx. Spouse administered the medication. They used [facility]. Pt states she would like to use the same companies if has to have abx again.  TOC following.  Expected Discharge Plan: Home/Self Care Barriers to Discharge: Continued Medical Work up   Patient Goals and CMS Choice            Expected Discharge Plan and Services   Discharge Planning Services: CM Consult   Living arrangements for the past 2 months: Single Family Home                                      Prior Living Arrangements/Services Living arrangements for the past 2 months: Single Family Home Lives with:: Spouse Patient language and need for interpreter reviewed:: Yes Do you feel safe going back to the place where you live?: Yes        Care giver support system in place?: Yes (comment) Current home services: DME (rollator/ walker/ wheelchair/ shower seat/ BSC) Criminal Activity/Legal Involvement Pertinent to Current Situation/Hospitalization: No - Comment as needed  Activities of Daily Living Home Assistive Devices/Equipment: Walker (specify type), CBG Meter, Bedside commode/3-in-1, Wheelchair, Stimulators, Grab bars in shower, Grab bars around toilet ADL Screening (condition at time of admission) Patient's cognitive ability adequate to safely complete daily activities?: No Is the patient deaf or have difficulty hearing?: No Does the patient have difficulty seeing, even when wearing  glasses/contacts?: No Does the patient have difficulty concentrating, remembering, or making decisions?: No Patient able to express need for assistance with ADLs?: Yes Does the patient have difficulty dressing or bathing?: Yes Independently performs ADLs?: Yes (appropriate for developmental age) Does the patient have difficulty walking or climbing stairs?: Yes Weakness of Legs: Both Weakness of Arms/Hands: None  Permission Sought/Granted                  Emotional Assessment Appearance:: Appears stated age Attitude/Demeanor/Rapport: Engaged Affect (typically observed): Accepting Orientation: : Oriented to Self, Oriented to Place, Oriented to  Time, Oriented to Situation   Psych Involvement: No (comment)  Admission diagnosis:  Discitis [M46.40] AKI (acute kidney injury) (HCC) [N17.9] Lumbar degenerative disc disease [M51.36] Discitis of thoracolumbar region [M46.45] Stage 3b chronic kidney disease (HCC) [N18.32] Patient Active Problem List   Diagnosis Date Noted   Discitis 02/13/2023   Malfunction of spinal cord stimulator (HCC) 02/13/2023   Serratia 02/13/2023   Somnolence 02/13/2023   Diabetes mellitus without complication (HCC) 02/13/2023   Osteomyelitis (HCC) 02/05/2023   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   Infection of spinal cord stimulator (HCC) 12/07/2022   Lactic acidosis 12/06/2022   Hypomagnesemia 12/06/2022   Anemia of chronic disease 12/06/2022   Diskitis 12/05/2022   Acute thoracic back pain 06/22/2022   Other long term (current) drug therapy 06/22/2022   Lumbar spondylosis 06/22/2022   Thoracic spondylosis 06/22/2022  Bilateral sacroiliitis (HCC) 06/22/2022   Chronic, continuous use of opioids 05/31/2022   Pain medication agreement 05/02/2022   Postlaminectomy syndrome of lumbar region 05/02/2022   Radiculopathy of thoracolumbar region 05/02/2022   Class 2 severe obesity due to excess calories with serious comorbidity and body mass index (BMI) of 37.0 to  37.9 in adult [facility]) 05/02/2022   Mastodynia 04/13/2022   Peripheral artery disease (HCC) 07/11/2021   COVID-19 virus infection 06/21/2021   Rib pain on left side 06/06/2021   Abdominal pain 06/05/2021   Diabetes 1.5, 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   GAD (generalized anxiety disorder) 11/25/2020   Anxiety and depression 11/03/2020   DM2 (diabetes mellitus, type 2) (HCC)    Restless leg syndrome    PONV (postoperative nausea and vomiting)    Neuropathy    Lumbar stenosis    Left bundle branch block    Hypercholesterolemia    Heart murmur    History of kidney stones    H/O hiatal hernia    Essential hypertension    Dyspnea    Chronic depression    Complication of anesthesia    CKD (chronic kidney disease)    CAD (coronary artery disease)    Bursitis of right hip    Arthritis    Anxiety    Morbid obesity (HCC) 10/07/2020   Hypoxia 10/07/2020   History of esophageal stricture 10/07/2020   CKD (chronic kidney disease), stage III (HCC) 10/07/2020   Leg weakness 10/07/2020   UTI (urinary tract infection) 10/07/2020   Chronic hypoxemic respiratory failure (HCC) 10/07/2020   Pulmonary hypertension (HCC) 10/06/2020   Chest pain, neg MI, stable CAD non obstructive on cath 10/05/20 10/04/2020   Hardening of the aorta (main artery of the heart) (HCC) 11/25/2019   Hypertensive heart disease with congestive heart failure and chronic kidney disease (HCC) 11/25/2019   Shortness of breath 06/11/2019   Fatty liver 04/08/2019   Migraine 04/08/2019   History of chronic kidney disease 04/08/2019   Acute non-recurrent sinusitis 10/12/2017   Chronic diastolic CHF (congestive heart failure) (HCC) 08/20/2017   Genetic testing 08/17/2017   Family history of ovarian cancer 08/17/2017   Family history of breast cancer 08/17/2017   Cough 04/19/2017   DOE (dyspnea on exertion) 04/19/2017   Diabetic peripheral neuropathy associated with type 2 diabetes  mellitus (HCC) 01/16/2017   Hypertensive heart disease with heart failure (HCC) 01/01/2017   Hyperlipidemia 01/01/2017   LBBB (left bundle branch block) 01/01/2017   Elevated liver enzymes 12/05/2016   Meralgia paraesthetica 12/05/2016   Tinnitus 12/05/2016   Spinal stenosis of lumbar region with neurogenic claudication 12/05/2016   Wound dehiscence 10/23/2016   S/P lumbar spinal fusion 08/29/2016   OSA (obstructive sleep apnea) 05/24/2016   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   Chronic low back pain without sciatica 03/14/2016   GERD (gastroesophageal reflux disease) 03/14/2016   Gout 03/14/2016   Iron deficiency anemia due to chronic blood loss 03/14/2016   Type 2 diabetes mellitus without complication, with long-term current use of insulin (HCC) 03/14/2016   Cannot sleep 12/07/2015   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HCC) 12/07/2015   Restless leg 12/07/2015   S/P lumbar laminectomy 11/26/2015   Postprocedural state 11/26/2015   Mild CAD 11/24/2015   Lumbar radiculopathy 10/15/2015   History of blood transfusion 2016   Kidney stone 04/20/2014   Stricture of esophagus 04/20/2014   Greater trochanteric bursitis of right hip 02/02/2012   Iliotibial  band syndrome of right side 02/02/2012   Iliotibial band syndrome 02/02/2012   Bursitis, trochanteric 02/02/2012   PCP:  Olive Bass, MD Pharmacy:   [facility] [address], [place] - [address] [place] [address] Phone: [phone] Fax: [phone]     Social Determinants of Health (SDOH) Social History: SDOH Screenings   Food Insecurity: No Food Insecurity (02/13/2023)  Housing: Low Risk  (02/13/2023)  Transportation Needs: No Transportation Needs (02/13/2023)  Utilities: Not At Risk (02/13/2023)  Depression (PHQ2-9): Low Risk  (02/08/2023)  Tobacco Use: Low Risk  (02/14/2023)   SDOH Interventions:     Readmission Risk Interventions     No data to display

## 2023-02-14 NOTE — Progress Notes (Signed)
Hypoglycemic Event  CBG: 60  Treatment: D50 50 mL (25 gm)  Symptoms: Sweaty  Follow-up CBG: Time:0704 CBG Result:175  Possible Reasons for Event: Inadequate meal intake  Comments/MD notified:Zheng    Harlyn Italiano A Jaonna Word

## 2023-02-14 NOTE — Progress Notes (Signed)
Pt states she doesn't wear CPAP at home and doesn't want to wear CPAP for the night.

## 2023-02-14 NOTE — Hospital Course (Addendum)
Katelyn [name] is a 67 y.o.female with a history of  anxiety, chronic low back pain with multiple lumbar surgeries, type 2 diabetes, obesity, OSA, left bundle branch block, hypertension, hypercholesterolemia, CKD, CAD who was admitted to the [facility] Medicine Teaching Service at [facility] for back pain. Her hospital course is detailed below:  Discitis, T11/T12 H/o chronic back pain with five spinal surgeries most recently insertion of nerve stimulator with NSGY. Admitted for worsening back pain in the setting of failed outpatient ID management with 6 weeks antibiotics for discitis. 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  Neurosurgery was consulted and completed extension of instrumented stabilization/fusion on 5/13.*** Postoperative course was uncomplicated.  Her pain was treated with***.  She was discharged home with***.  She was instructed to take***antibiotics for***days.  T2DM Home [name] was held.  Treated with sensitive sliding scale insulin.   PCP Follow-up Recommendations:  DM regimen: Required very minimal amounts of short acting insulin while inpatient.

## 2023-02-14 NOTE — Progress Notes (Addendum)
Daily Progress Note Intern Pager: [phone]  Patient name: Katelyn [name] Medical record number: [id number] Date of birth: Nov 02, 1955 Age: 67 y.o. Gender: female  Primary Care Provider: Olive Bass, MD Consultants: ID, NSGY Code Status: Full code  Pt Overview and Major Events to Date:  5/7: Admitted to FMTS  Assessment and Plan: Katelyn [name] is a 67 y.o. female presenting with back pain and radiculopathy in setting of discitis/infection. PMH is significant for anxiety, chronic low back pain with multiple lumbar surgeries, type 2 diabetes, obesity, OSA, left bundle branch block, hypertension, hypercholesterolemia, CKD, CAD.   * Diskitis ID wants to collect aspiration culture to guide antibiotic management given patient failed 6 weeks outpatient abx therapy. Possibly need to remove nerve stimulator as potential nidus per ID but NSG not planning at this time. Pending NSGY evaluation this AM. Patient with significant somnolence with Dilaudid 1mg , consider Palliative consult if difficult pain control. -NSGY following, appreciate recs -ID following, appreciate recs -BC pending -Abx pending aspiration culture  Diabetes mellitus without complication (HCC) BGL in 100s. Holding home insulin Treshiba 110U daily and Novolog 55U TID. A1c 7.2 in 11/2022. Will likely need to add back insulin regimen as patient diet advances. -sSSI with CBG checks  Somnolence Resolved. AOx4 this AM -Advanced pending IR procedure -Watch for additional somnolence with pain regimen and consider Narcan as needed  Anemia of chronic disease Hgb 10.3, baseline Hgb ~10. Likely in the setting of CKD  CKD stage 3b, GFR 30-44 ml/min (HCC) Cr mildly improved. Will continue to closely monitor UOP given history of AKI.  -Continue MIVF @ 28mL/hr -Avoid nephrotoxic agents   Chronic stable conditions: HTN: Continue Carvedilol  HLD: Continue Rosuvastatin  RLS: Continue Pramipexole  Gout: Continue Allopurinol  OSA:  ordered CPAP nightly   FEN/GI: NPO pending disc aspiration Prophylaxis: Heparin Dispo: Home pending pain management and ID/NSGY input  Subjective:  Patient assessed at bedside with husband present. Reports she is in significant pain and having "jolts" radiating pain from her back down her legs. Appears very uncomfortable upon exam. Reports she remembers "nothing" after getting Dilaudid yesterday. Purwick in place, some UOP. Recent BM. Asking to eat.  Objective: Temp:  [97.8 F (36.6 C)-99.3 F (37.4 C)] 98.3 F (36.8 C) (05/08 1124) Pulse Rate:  [65-86] 73 (05/08 1124) Resp:  [7-20] 18 (05/08 1124) BP: (92-159)/(38-137) 149/64 (05/08 1124) SpO2:  [92 %-100 %] 99 % (05/08 1124) Physical Exam: General: Laying rigid in the bed, intermittent distress for shooting back pain Cardiovascular: RRR without murmur Respiratory: CTAB. Normal WOB on RA Abdomen: Soft, non-tender, non-distended Extremities: No peripheral edema  Laboratory: Most recent CBC Lab Results  Component Value Date   WBC 6.6 02/14/2023   HGB 10.3 (L) 02/14/2023   HCT 30.5 (L) 02/14/2023   MCV 90.5 02/14/2023   PLT 118 (L) 02/14/2023   Most recent BMP    Latest Ref Rng & Units 02/14/2023   12:25 AM  BMP  Glucose 70 - 99 mg/dL 88   BUN 8 - 23 mg/dL 51   Creatinine 1.47 - 1.00 mg/dL 8.29   Sodium 562 - 130 mmol/L 134   Potassium 3.5 - 5.1 mmol/L 4.2   Chloride 98 - 111 mmol/L 95   CO2 22 - 32 mmol/L 29   Calcium 8.9 - 10.3 mg/dL 9.2     Other pertinent labs: Glu: 143, 175, 60  Imaging/Diagnostic Tests: CT HEAD WO CONTRAST ( ) Result Date: 02/13/2023 IMPRESSION: Negative noncontrast head  CT.  MR Lumbar Spine W Wo Contrast Result Date: 02/13/2023 IMPRESSION: 1. Persistent and progressed discitis/osteomyelitis at T11-T12 with worsening endplate destruction and loss of vertebral body height at both levels, worse at T12. 2. Epidural phlegmon is similar to the prior study without discrete abscess. Unchanged  moderate spinal canal stenosis and severe bilateral neural foraminal stenosis at T11-T12. 3. Probable associated right-sided septic arthritis at T11-T12, similar to the prior study. No convincing left-sided septic arthritis. 4. No other evidence of infection in the thoracic or lumbar spine. 5. Postsurgical changes reflecting posterior instrumented fusion from L2 through L5 without residual significant spinal canal or neural foraminal stenosis at these levels. 6. 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 7. Disc bulge eccentric to the left at L5-S1 resulting in mild right worse than left subarticular zone narrowing without evidence of frank nerve root impingement, and mild-to-moderate left and mild right neural foraminal stenosis, unchanged.   MR THORACIC SPINE W WO CONTRAST Result Date: 02/13/2023 IMPRESSION: 1. Persistent and progressed discitis/osteomyelitis at T11-T12 with worsening endplate destruction and loss of vertebral body height at both levels, worse at T12. 2. Epidural phlegmon is similar to the prior study without discrete abscess. Unchanged moderate spinal canal stenosis and severe bilateral neural foraminal stenosis at T11-T12. 3. Probable associated right-sided septic arthritis at T11-T12, similar to the prior study. No convincing left-sided septic arthritis. 4. No other evidence of infection in the thoracic or lumbar spine. 5. Postsurgical changes reflecting posterior instrumented fusion from L2 through L5 without residual significant spinal canal or neural foraminal stenosis at these levels. 6. 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 7. Disc bulge eccentric to the left at L5-S1 resulting in mild right worse than left subarticular zone narrowing without evidence of frank nerve root impingement, and mild-to-moderate left and mild  right neural foraminal stenosis, unchanged.   Katelyn Fortis, MD 02/14/2023, 12:30 PM  PGY-1, [facility] Family Medicine FPTS Intern pager: [phone], text pages welcome Secure chat group [facility] Teaching Service

## 2023-02-15 ENCOUNTER — Other Ambulatory Visit: Payer: Self-pay | Admitting: Neurological Surgery

## 2023-02-15 DIAGNOSIS — M4645 Discitis, unspecified, thoracolumbar region: Secondary | ICD-10-CM | POA: Diagnosis not present

## 2023-02-15 DIAGNOSIS — M4644 Discitis, unspecified, thoracic region: Secondary | ICD-10-CM | POA: Diagnosis not present

## 2023-02-15 DIAGNOSIS — E1122 Type 2 diabetes mellitus with diabetic chronic kidney disease: Secondary | ICD-10-CM | POA: Diagnosis not present

## 2023-02-15 DIAGNOSIS — A498 Other bacterial infections of unspecified site: Secondary | ICD-10-CM | POA: Diagnosis not present

## 2023-02-15 DIAGNOSIS — N1832 Chronic kidney disease, stage 3b: Secondary | ICD-10-CM | POA: Diagnosis not present

## 2023-02-15 LAB — GLUCOSE, CAPILLARY
Glucose-Capillary: 83 mg/dL (ref 70–99)
Glucose-Capillary: 221 mg/dL — ABNORMAL HIGH (ref 70–99)
Glucose-Capillary: 121 mg/dL — ABNORMAL HIGH (ref 70–99)
Glucose-Capillary: 139 mg/dL — ABNORMAL HIGH (ref 70–99)

## 2023-02-15 LAB — BASIC METABOLIC PANEL
Anion gap: 9 (ref 5–15)
BUN: 40 mg/dL — ABNORMAL HIGH (ref 8–23)
CO2: 26 mmol/L (ref 22–32)
Calcium: 9.5 mg/dL (ref 8.9–10.3)
Chloride: 99 mmol/L (ref 98–111)
Creatinine, Ser: 1.56 mg/dL — ABNORMAL HIGH (ref 0.44–1.00)
GFR, Estimated: 37 mL/min — ABNORMAL LOW (ref >60)
Glucose, Bld: 106 mg/dL — ABNORMAL HIGH (ref 70–99)
Potassium: 4.4 mmol/L (ref 3.5–5.1)
Sodium: 134 mmol/L — ABNORMAL LOW (ref 135–145)

## 2023-02-15 LAB — CBC
HCT: 28.8 % — ABNORMAL LOW (ref 36.0–46.0)
Hemoglobin: 9.7 g/dL — ABNORMAL LOW (ref 12.0–15.0)
MCH: 30.4 pg (ref 26.0–34.0)
MCHC: 33.7 g/dL (ref 30.0–36.0)
MCV: 90.3 fL (ref 80.0–100.0)
Platelets: 101 10*3/uL — ABNORMAL LOW (ref 150–400)
RBC: 3.19 MIL/uL — ABNORMAL LOW (ref 3.87–5.11)
RDW: 14.8 % (ref 11.5–15.5)
WBC: 4.4 10*3/uL (ref 4.0–10.5)
nRBC: 0 % (ref 0.0–0.2)

## 2023-02-15 LAB — CULTURE, BLOOD (ROUTINE X 2)

## 2023-02-15 MED ORDER — LIDOCAINE 5 % EX PTCH
2.0000 | MEDICATED_PATCH | Freq: Every day | CUTANEOUS | Status: DC
  Administered 2023-02-15 – 2023-02-22 (×7): 2 via TRANSDERMAL
  Filled 2023-02-15 (×9): qty 2

## 2023-02-15 MED ORDER — POLYETHYLENE GLYCOL 3350 17 G PO PACK
17.0000 g | PACK | Freq: Every day | ORAL | Status: DC
  Administered 2023-02-15 – 2023-02-22 (×7): 17 g via ORAL
  Filled 2023-02-15 (×7): qty 1

## 2023-02-15 MED ORDER — OXYCODONE HCL 5 MG PO TABS
2.5000 mg | ORAL_TABLET | Freq: Once | ORAL | Status: AC
  Administered 2023-02-15: 2.5 mg via ORAL
  Filled 2023-02-15: qty 1

## 2023-02-15 MED ORDER — SENNA 8.6 MG PO TABS
1.0000 | ORAL_TABLET | Freq: Every day | ORAL | Status: DC
  Administered 2023-02-15 – 2023-02-16 (×2): 8.6 mg via ORAL
  Filled 2023-02-15 (×2): qty 1

## 2023-02-15 MED ORDER — OXYCODONE HCL 5 MG PO TABS
2.5000 mg | ORAL_TABLET | Freq: Four times a day (QID) | ORAL | Status: DC | PRN
  Administered 2023-02-15 – 2023-02-16 (×3): 5 mg via ORAL
  Administered 2023-02-16: 2.5 mg via ORAL
  Filled 2023-02-15 (×4): qty 1

## 2023-02-15 NOTE — Evaluation (Signed)
Physical Therapy Evaluation Patient Details Name: Katelyn [name] MRN: [id number] DOB: September 04, 1956 Today's Date: 02/15/2023  History of Present Illness  Patient is a 67 y/o female admitted 02/13/23 with back pain and radiculopathy.  She was found to have discitis/infection.  PMH positive for anxiety, chronic low back pain with multiple lumbar surgeries (most recent in Dec with spinal stimulator), DM2, obesity, OSA, LBBB, HTN, hypercholesterolemia, CKD, CAD.  Clinical Impression  Patient seen for in bed assessment since awaiting stabilization of spine on Monday per Dr. Yetta Barre.  Currently reports able to roll in bed and pull herself up and even sat on side of the bed prior to knowing needing to stay in bed though reports much increased her pain.  She will benefit from PT follow up after surgery.  Gave her several in bed activities to continue throughout the weekend to help preserve her strength.       Recommendations for follow up therapy are one component of a multi-disciplinary discharge planning process, led by the attending physician.  Recommendations may be updated based on patient status, additional functional criteria and insurance authorization.  Follow Up Recommendations       Assistance Recommended at Discharge Frequent or constant Supervision/Assistance  Patient can return home with the following  Assist for transportation;Help with stairs or ramp for entrance;Two people to help with walking and/or transfers    Equipment Recommendations None recommended by PT  Recommendations for Other Services       Functional Status Assessment Patient has had a recent decline in their functional status and demonstrates the ability to make significant improvements in function in a reasonable and predictable amount of time.     Precautions / Restrictions Precautions Precautions: Back Precaution Comments: in bed therapy till after surgery Monday for spinal stabilization      Mobility  Bed  Mobility Overal bed mobility: Needs Assistance             General bed mobility comments: reports had pulled herself up in bed and recently did log roll to sit up on side of bed before RN said needed to stay in bed, reports had increased pain and pain meds not due until 4:30    Transfers                        Ambulation/Gait                  Stairs            Wheelchair Mobility    Modified Rankin (Stroke Patients Only)       Balance                                             Pertinent Vitals/Pain Pain Assessment Pain Assessment: Faces Faces Pain Scale: Hurts whole lot Pain Location: lower back and legse Pain Descriptors / Indicators: Aching, Burning, Shooting Pain Intervention(s): Monitored during session, Limited activity within patient's tolerance    Home Living Family/patient expects to be discharged to:: Private residence Living Arrangements: Spouse/significant other Available Help at Discharge: Family Type of Home: House Home Access: Ramped entrance       Home Layout: One level Home Equipment: Agricultural consultant (2 wheels);Rollator (4 wheels);Wheelchair - manual;Shower seat;Grab bars - tub/shower      Prior Function Prior Level of Function : History of Falls (last  six months);Independent/Modified Independent             Mobility Comments: usually ambulates with rollator and 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 fell about a month ago reaching for flash light when she was going over threshold and walker folded ADLs Comments: usually completes showering and helps with cooking, dishes and driving     Hand Dominance   Dominant Hand: Right    Extremity/Trunk Assessment   Upper Extremity Assessment Upper Extremity Assessment: Overall WFL for tasks assessed    Lower Extremity Assessment Lower Extremity Assessment: RLE deficits/detail;LLE deficits/detail RLE Deficits / Details: AROM WFL, strength at least  antigravity, with resistance c/o cramp up to post hip area (like sciatic pain) RLE Sensation: history of peripheral neuropathy LLE Deficits / Details: AROM WFL, strength at least antigravity, limited toe DF 1st 3 toes and reports they curl up under her sometimes when walking; notes more numbness lateral thigh than on R and has altered sensation on great toe as well LLE Sensation: history of peripheral neuropathy       Communication   Communication: No difficulties  Cognition Arousal/Alertness: Awake/alert Behavior During Therapy: WFL for tasks assessed/performed Overall Cognitive Status: Within Functional Limits for tasks assessed                                          General Comments General comments (skin integrity, edema, etc.): spouse in the room and supportive    Exercises Other Exercises Other Exercises: performed AP's, heel slides, glut sets and encouraged to continue Other Exercises: for upper body encouraged to keep using rails to pull herself up to keep strength in her arms while awaiting surgery   Assessment/Plan    PT Assessment Patient needs continued PT services  PT Problem List Decreased strength;Decreased mobility;Pain;Decreased activity tolerance       PT Treatment Interventions Functional mobility training;Wheelchair mobility training;Therapeutic activities;Therapeutic exercise;Patient/family education;Gait training;Balance training;DME instruction    PT Goals (Current goals can be found in the Care Plan section)  Acute Rehab PT Goals Patient Stated Goal: to walk after surgery and go home PT Goal Formulation: With patient/family Time For Goal Achievement: 03/01/23 Potential to Achieve Goals: Good    Frequency Min 3X/week     Co-evaluation               AM-PAC PT "6 Clicks" Mobility  Outcome Measure Help needed turning from your back to your side while in a flat bed without using bedrails?: A Little Help needed moving from  lying on your back to sitting on the side of a flat bed without using bedrails?: Total Help needed moving to and from a bed to a chair (including a wheelchair)?: Total Help needed standing up from a chair using your arms (e.g., wheelchair or bedside chair)?: Total Help needed to walk in hospital room?: Total Help needed climbing 3-5 steps with a railing? : Total 6 Click Score: 8    End of Session   Activity Tolerance: Patient limited by pain Patient left: in bed   PT Visit Diagnosis: Other abnormalities of gait and mobility (R26.89);Pain Pain - part of body:  (back and L > R leg)    Time: 1191-4782 PT Time Calculation (min) (ACUTE ONLY): 16 min   Charges:   PT Evaluation $PT Eval Moderate Complexity: 1 Mod          Sheran Lawless, PT  Acute Rehabilitation Services [phone] 02/15/2023   Elray Mcgregor 02/15/2023, 4:15 PM

## 2023-02-15 NOTE — Progress Notes (Signed)
Subjective: No new complaints   Antibiotics:  Anti-infectives (From admission, onward)    None       Medications: Scheduled Meds:  allopurinol  100 mg Oral QHS   carvedilol  6.25 mg Oral BID   DULoxetine  60 mg Oral QHS   heparin injection (subcutaneous)  5,000 Units Subcutaneous Q8H   HYDROcodone-acetaminophen  1 tablet Oral Q6H   insulin aspart  0-9 Units Subcutaneous TID WC   mirtazapine  30 mg Oral QHS   pantoprazole  40 mg Oral Daily   pramipexole  1 mg Oral QHS   pregabalin  75 mg Oral QHS   Continuous Infusions: PRN Meds:.acetaminophen    Objective: Weight change:   Intake/Output Summary (Last 24 hours) at 02/15/2023 1046 Last data filed at 02/15/2023 0644 Gross per 24 hour  Intake 652.92 ml  Output 2750 ml  Net -2097.08 ml   Blood pressure (!) 126/51, pulse 72, temperature 97.9 F (36.6 C), temperature source Oral, resp. rate 17, height 5\' 2"  (1.575 m), weight 85.3 kg, SpO2 93 %. Temp:  [97.4 F (36.3 C)-98.4 F (36.9 C)] 97.9 F (36.6 C) (05/09 0729) Pulse Rate:  [61-89] 72 (05/09 0729) Resp:  [10-18] 17 (05/09 0729) BP: (104-157)/(40-75) 126/51 (05/09 0729) SpO2:  [93 %-100 %] 93 % (05/09 0729)  Physical Exam: Physical Exam Constitutional:      General: She is not in acute distress.    Appearance: She is well-developed. She is not diaphoretic.  HENT:     Head: Normocephalic and atraumatic.     Right Ear: External ear normal.     Left Ear: External ear normal.     Mouth/Throat:     Pharynx: No oropharyngeal exudate.  Eyes:     General: No scleral icterus.    Conjunctiva/sclera: Conjunctivae normal.     Pupils: Pupils are equal, round, and reactive to light.  Cardiovascular:     Rate and Rhythm: Normal rate and regular rhythm.  Pulmonary:     Effort: Pulmonary effort is normal. No respiratory distress.     Breath sounds: Normal breath sounds. No wheezing or rales.  Abdominal:     General: There is no distension.      Palpations: Abdomen is soft.  Musculoskeletal:        General: No tenderness. Normal range of motion.  Lymphadenopathy:     Cervical: No cervical adenopathy.  Skin:    General: Skin is warm and dry.     Coloration: Skin is not pale.     Findings: No erythema or rash.  Neurological:     General: No focal deficit present.     Mental Status: She is alert and oriented to person, place, and time.     Motor: No abnormal muscle tone.  Psychiatric:        Mood and Affect: Mood normal.        Behavior: Behavior normal.        Thought Content: Thought content normal.        Judgment: Judgment normal.      CBC:    BMET Recent Labs    02/14/23 0025 02/15/23 0347  NA 134* 134*  K 4.2 4.4  CL 95* 99  CO2 29 26  GLUCOSE 88 106*  BUN 51* 40*  CREATININE 1.88* 1.56*  CALCIUM 9.2 9.5     Liver Panel  No results for input(s): "PROT", "ALBUMIN", "AST", "ALT", "ALKPHOS", "BILITOT", "BILIDIR", "IBILI" in the last  72 hours.     Sedimentation Rate No results for input(s): "ESRSEDRATE" in the last 72 hours. C-Reactive Protein No results for input(s): "CRP" in the last 72 hours.  Micro Results: Recent Results (from the past 720 hour(s))  Blood culture (routine x 2)     Status: None (Preliminary result)   Collection Time: 02/13/23  7:44 PM   Specimen: BLOOD  Result Value Ref Range Status   Specimen Description BLOOD SITE NOT SPECIFIED  Final   Special Requests   Final    BOTTLES DRAWN AEROBIC AND ANAEROBIC Blood Culture results may not be optimal due to an excessive volume of blood received in culture bottles   Culture   Final    NO GROWTH 2 DAYS Performed at [facility], [address] [place], [place] [address]    Report Status PENDING  Incomplete  Blood culture (routine x 2)     Status: None (Preliminary result)   Collection Time: 02/13/23  7:54 PM   Specimen: BLOOD  Result Value Ref Range Status   Specimen Description BLOOD SITE NOT SPECIFIED  Final   Special  Requests   Final    BOTTLES DRAWN AEROBIC AND ANAEROBIC Blood Culture results may not be optimal due to an excessive volume of blood received in culture bottles   Culture   Final    NO GROWTH 2 DAYS Performed at [facility], [address], [place], [place] [address]    Report Status PENDING  Incomplete  Aerobic/Anaerobic Culture w Gram Stain (surgical/deep wound)     Status: None (Preliminary result)   Collection Time: 02/14/23  1:00 PM   Specimen: Wound; Fine Needle Aspirate  Result Value Ref Range Status   Specimen Description WOUND  Final   Special Requests DISC SPACE T11 T12  Final   Gram Stain NO WBC SEEN NO ORGANISMS SEEN   Final   Culture   Final    NO GROWTH < 24 HOURS Performed at [facility], [address], [place], [place] [address]    Report Status PENDING  Incomplete    Studies/Results: CT THORACIC SPINE WO CONTRAST  Result Date: 02/15/2023 CLINICAL DATA:  Osteomyelitis EXAM: CT THORACIC AND LUMBAR SPINE WITHOUT CONTRAST TECHNIQUE: Multidetector CT imaging of the thoracic and lumbar spine was performed without contrast. Multiplanar CT image reconstructions were also generated. RADIATION DOSE REDUCTION: This exam was performed according to the departmental dose-optimization program which includes automated exposure control, adjustment of the mA and/or kV according to patient size and/or use of iterative reconstruction technique. COMPARISON:  None Available. FINDINGS: CT THORACIC SPINE FINDINGS Alignment: Normal. Vertebrae: There is advanced endplate destruction at T11-12 with gas in the disc space. There is no other focal osseous abnormality of the thoracic spine. Paraspinal and other soft tissues: Calcific aortic atherosclerosis. Mild paravertebral inflammatory change without paravertebral abscess. Stimulator leads enter the spinal canal at the T8-9 level and terminate at the T6 level. Disc levels: No high-grade spinal canal stenosis. CT LUMBAR SPINE FINDINGS  Segmentation: 5 lumbar type vertebrae. Alignment: Normal. Vertebrae: L2-5 posterior instrumented fusion with interbody spacers at L3-4 and L4-5. Solid arthrodesis. No perihardware lucency. No acute osseous abnormality. Paraspinal and other soft tissues: Calcific aortic atherosclerosis and distended urinary bladder. Disc levels: No high-grade spinal canal stenosis. Degenerative findings better characterized on yesterday's lumbar spine MRI. IMPRESSION: 1. Advanced endplate destruction at T11-12 with gas in the disc space, consistent with discitis-osteomyelitis. 2. No acute osseous abnormality of the lumbar  spine. L2-5 fusion with solid arthrodesis. 3. Degenerative findings better characterized on yesterday's lumbar spine MRI. Aortic Atherosclerosis (ICD10-I70.0). Electronically Signed   By: Deatra Robinson M.D.   On: 02/15/2023 00:29   CT LUMBAR SPINE WO CONTRAST  Result Date: 02/15/2023 CLINICAL DATA:  Osteomyelitis EXAM: CT THORACIC AND LUMBAR SPINE WITHOUT CONTRAST TECHNIQUE: Multidetector CT imaging of the thoracic and lumbar spine was performed without contrast. Multiplanar CT image reconstructions were also generated. RADIATION DOSE REDUCTION: This exam was performed according to the departmental dose-optimization program which includes automated exposure control, adjustment of the mA and/or kV according to patient size and/or use of iterative reconstruction technique. COMPARISON:  None Available. FINDINGS: CT THORACIC SPINE FINDINGS Alignment: Normal. Vertebrae: There is advanced endplate destruction at T11-12 with gas in the disc space. There is no other focal osseous abnormality of the thoracic spine. Paraspinal and other soft tissues: Calcific aortic atherosclerosis. Mild paravertebral inflammatory change without paravertebral abscess. Stimulator leads enter the spinal canal at the T8-9 level and terminate at the T6 level. Disc levels: No high-grade spinal canal stenosis. CT LUMBAR SPINE FINDINGS  Segmentation: 5 lumbar type vertebrae. Alignment: Normal. Vertebrae: L2-5 posterior instrumented fusion with interbody spacers at L3-4 and L4-5. Solid arthrodesis. No perihardware lucency. No acute osseous abnormality. Paraspinal and other soft tissues: Calcific aortic atherosclerosis and distended urinary bladder. Disc levels: No high-grade spinal canal stenosis. Degenerative findings better characterized on yesterday's lumbar spine MRI. IMPRESSION: 1. Advanced endplate destruction at T11-12 with gas in the disc space, consistent with discitis-osteomyelitis. 2. No acute osseous abnormality of the lumbar spine. L2-5 fusion with solid arthrodesis. 3. Degenerative findings better characterized on yesterday's lumbar spine MRI. Aortic Atherosclerosis (ICD10-I70.0). Electronically Signed   By: Deatra Robinson M.D.   On: 02/15/2023 00:29   IR THORACIC DISC ASPIRATION W/IMG GUIDE  Result Date: 02/14/2023 INDICATION: Persistent discitis/osteomyelitis despite antibiotic therapy. EXAM: FLUOROSCOPY GUIDED T11-12 INTERVERTEBRAL DISC FINE-NEEDLE ASPIRATION MEDICATIONS: No antibiotics administered. ANESTHESIA/SEDATION: A total of Versed 1 mg and Fentanyl 50 mcg were administered intravenously for moderate conscious sedation monitored under my direct supervision. Total intraservice time of sedation was 16 minute. The patient's vital signs were monitored throughout the procedure and recorded in the patient's medical record by the radiology nurse. FLUOROSCOPY DOSE: Peak Skin Dose 80 mGy COMPLICATIONS: None immediate. PROCEDURE: Informed written consent was obtained from the patient after a thorough discussion of the procedural risks, benefits and alternatives. All questions were addressed. Maximal Sterile Barrier Technique was utilized including caps, mask, sterile gowns, sterile gloves, sterile drape, hand hygiene and skin antiseptic. A timeout was performed prior to the initiation of the procedure. The patient was placed in prone  position on the angiography table. The thoracic spine region was prepped and draped in a sterile fashion. Under fluoroscopy, the T11-12 disc space was delineated and the skin area was marked. The skin was infiltrated with a 1% Lidocaine approximately 3 cm lateral to the spinous process projection on the left. Using a 22-gauge spinal needle, the soft issue and the peripedicular space were infiltrated with Bupivacaine 0.5%. Subsequently, the 20 gauge needle was advanced under fluoroscopy guided into the T11-12 disc space. The mandrel was removed and 2 fine-needle aspiration samples were obtained. Small amount of blood tinged fluid was collected in each sample. The needle was subsequently withdrawn. The access sites were cleaned and covered with a sterile bandage. IMPRESSION: Successful fluoroscopy guided fine-needle aspiration of the T11-12 intervertebral disc. Samples obtained were sent for Gram stain and culture. Electronically Signed  By: Baldemar Lenis M.D.   On: 02/14/2023 15:31   CT HEAD WO CONTRAST ( )  Result Date: 02/13/2023 CLINICAL DATA:  Altered mental status. EXAM: CT HEAD WITHOUT CONTRAST TECHNIQUE: Contiguous axial images were obtained from the base of the skull through the vertex without intravenous contrast. RADIATION DOSE REDUCTION: This exam was performed according to the departmental dose-optimization program which includes automated exposure control, adjustment of the mA and/or kV according to patient size and/or use of iterative reconstruction technique. COMPARISON:  01/11/2023 FINDINGS: Brain: No evidence of intracranial hemorrhage, acute infarction, hydrocephalus, extra-axial collection, or mass lesion/mass effect. Vascular:  No hyperdense vessel or other acute findings. Skull: No evidence of fracture or other significant bone abnormality. Sinuses/Orbits:  No acute findings. Other: None. IMPRESSION: Negative noncontrast head CT. Electronically Signed   By: Danae Orleans M.D.    On: 02/13/2023 18:53   MR Lumbar Spine W Wo Contrast  Result Date: 02/13/2023 CLINICAL DATA:  Mid back pain.  History of osteomyelitis. EXAM: MRI THORACIC AND LUMBAR SPINE WITHOUT AND WITH CONTRAST TECHNIQUE: Multiplanar and multiecho pulse sequences of the thoracic and lumbar spine were obtained without and with intravenous contrast. CONTRAST:  8mL GADAVIST GADOBUTROL 1 MMOL/ML IV SOLN COMPARISON:  Thoracic spine MRI 12/05/2022. FINDINGS: MRI THORACIC SPINE FINDINGS Alignment: There is grade 1 retrolisthesis of T11 on T12, new/increased since the prior study. Vertebrae: Background marrow signal is normal. There is confluent T1 hypointensity with T2/STIR hyperintensity and enhancement in the T11 and T12 vertebral bodies consistent with discitis/osteomyelitis. Compared to the study from 12/05/2022, endplate destruction has worsened, with progressed loss of vertebral body height at both levels, worse at T12. There is suspected septic arthritis on the right at T11-T12, similar to the prior study (4-6). There is no convincing left septic arthritis. There is both dorsal and ventral epidural enhancement in the spinal canal centered at T11-T12 measuring up to 4 mm in thickness dorsally at T12 consistent with epidural phlegmon resulting in moderate spinal canal stenosis and severe bilateral neural foraminal stenosis at T11-T12. The dorsal phlegmon extends superiorly to the T8 level. There is no discrete abscess. There is no other evidence of infection. Background marrow signal is normal. Vertebral body heights are otherwise preserved. There enhancement along the T5-T6 and T6-T7 disc spaces without corresponding T1 hypointensity or edema, favored degenerative in nature. Cord: Normal in signal and morphology. A spinal stimulator is noted with the leads terminating at the T6 level. There is no significant enhancement around the distal end of the leads. Paraspinal and other soft tissues: There is edema in the paraspinal  soft tissues at T11-T12 without evidence of organized paraspinal abscess. Disc levels: As above, there is moderate spinal canal stenosis and severe bilateral neural foraminal stenosis at T11-T12 and severe bilateral neural foraminal stenosis at T11-T12 due to the infection and epidural phlegmon. Multilevel degenerative changes throughout the remainder of the thoracic spine with scattered disc herniations are unchanged compared to the recent prior study. There is no other significant spinal canal stenosis. There is no evidence of high-grade neural foraminal stenosis. MRI LUMBAR SPINE FINDINGS Segmentation: Standard; the lowest formed disc space is designated L5-S1. Alignment: There is levocurvature centered at L3. There is no significant antero or retrolisthesis. There is straightening of the normal lumbar lordosis. Vertebrae: Postsurgical changes reflecting posterior instrumented fusion from L2 through L5 are noted. Background marrow signal is normal. There is no suspicious marrow signal abnormality or marrow edema. There is no evidence of discitis/osteomyelitis in the  lumbar spine. Conus medullaris: Extends to the L1 level and appears normal. There is no definite abnormal enhancement of the conus or cauda equina nerve roots. Paraspinal and other soft tissues: Unremarkable. Disc levels: There is a mild disc bulge and mild facet arthropathy resulting mild-to-moderate spinal canal stenosis without significant neural foraminal stenosis L1-L2: There is marked disc degeneration with endplate spurring and bilateral facet arthropathy resulting in mild spinal canal stenosis and mild left and no significant right neural foraminal stenosis, unchanged since the prior MRI from 2022. L2-L3: Status post posterior instrumented fusion without significant residual spinal canal or neural foraminal stenosis L3-L4: Status post posterior instrumented fusion without residual significant spinal canal or neural foraminal stenosis L4-L5:  Status post posterior instrumented fusion without residual significant spinal canal or neural foraminal stenosis L5-S1: There is disc bulge eccentric to the left, left worse than right endplate spurring, and bilateral facet arthropathy resulting in mild right worse than left subarticular zone narrowing without evidence of frank nerve root impingement, and mild-to-moderate left and mild right neural foraminal stenosis. Findings are unchanged. IMPRESSION: 1. Persistent and progressed discitis/osteomyelitis at T11-T12 with worsening endplate destruction and loss of vertebral body height at both levels, worse at T12. 2. Epidural phlegmon is similar to the prior study without discrete abscess. Unchanged moderate spinal canal stenosis and severe bilateral neural foraminal stenosis at T11-T12. 3. Probable associated right-sided septic arthritis at T11-T12, similar to the prior study. No convincing left-sided septic arthritis. 4. No other evidence of infection in the thoracic or lumbar spine. 5. Postsurgical changes reflecting posterior instrumented fusion from L2 through L5 without residual significant spinal canal or neural foraminal stenosis at these levels. 6. 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 7. Disc bulge eccentric to the left at L5-S1 resulting in mild right worse than left subarticular zone narrowing without evidence of frank nerve root impingement, and mild-to-moderate left and mild right neural foraminal stenosis, unchanged. Electronically Signed   By: Lesia Hausen M.D.   On: 02/13/2023 14:44   MR THORACIC SPINE W WO CONTRAST  Result Date: 02/13/2023 CLINICAL DATA:  Mid back pain.  History of osteomyelitis. EXAM: MRI THORACIC AND LUMBAR SPINE WITHOUT AND WITH CONTRAST TECHNIQUE: Multiplanar and multiecho pulse sequences of the thoracic and lumbar spine were obtained without and with intravenous contrast. CONTRAST:  8mL  GADAVIST GADOBUTROL 1 MMOL/ML IV SOLN COMPARISON:  Thoracic spine MRI 12/05/2022. FINDINGS: MRI THORACIC SPINE FINDINGS Alignment: There is grade 1 retrolisthesis of T11 on T12, new/increased since the prior study. Vertebrae: Background marrow signal is normal. There is confluent T1 hypointensity with T2/STIR hyperintensity and enhancement in the T11 and T12 vertebral bodies consistent with discitis/osteomyelitis. Compared to the study from 12/05/2022, endplate destruction has worsened, with progressed loss of vertebral body height at both levels, worse at T12. There is suspected septic arthritis on the right at T11-T12, similar to the prior study (4-6). There is no convincing left septic arthritis. There is both dorsal and ventral epidural enhancement in the spinal canal centered at T11-T12 measuring up to 4 mm in thickness dorsally at T12 consistent with epidural phlegmon resulting in moderate spinal canal stenosis and severe bilateral neural foraminal stenosis at T11-T12. The dorsal phlegmon extends superiorly to the T8 level. There is no discrete abscess. There is no other evidence of infection. Background marrow signal is normal. Vertebral body heights are otherwise preserved. There enhancement along the T5-T6 and T6-T7 disc spaces without corresponding  T1 hypointensity or edema, favored degenerative in nature. Cord: Normal in signal and morphology. A spinal stimulator is noted with the leads terminating at the T6 level. There is no significant enhancement around the distal end of the leads. Paraspinal and other soft tissues: There is edema in the paraspinal soft tissues at T11-T12 without evidence of organized paraspinal abscess. Disc levels: As above, there is moderate spinal canal stenosis and severe bilateral neural foraminal stenosis at T11-T12 and severe bilateral neural foraminal stenosis at T11-T12 due to the infection and epidural phlegmon. Multilevel degenerative changes throughout the remainder of  the thoracic spine with scattered disc herniations are unchanged compared to the recent prior study. There is no other significant spinal canal stenosis. There is no evidence of high-grade neural foraminal stenosis. MRI LUMBAR SPINE FINDINGS Segmentation: Standard; the lowest formed disc space is designated L5-S1. Alignment: There is levocurvature centered at L3. There is no significant antero or retrolisthesis. There is straightening of the normal lumbar lordosis. Vertebrae: Postsurgical changes reflecting posterior instrumented fusion from L2 through L5 are noted. Background marrow signal is normal. There is no suspicious marrow signal abnormality or marrow edema. There is no evidence of discitis/osteomyelitis in the lumbar spine. Conus medullaris: Extends to the L1 level and appears normal. There is no definite abnormal enhancement of the conus or cauda equina nerve roots. Paraspinal and other soft tissues: Unremarkable. Disc levels: There is a mild disc bulge and mild facet arthropathy resulting mild-to-moderate spinal canal stenosis without significant neural foraminal stenosis L1-L2: There is marked disc degeneration with endplate spurring and bilateral facet arthropathy resulting in mild spinal canal stenosis and mild left and no significant right neural foraminal stenosis, unchanged since the prior MRI from 2022. L2-L3: Status post posterior instrumented fusion without significant residual spinal canal or neural foraminal stenosis L3-L4: Status post posterior instrumented fusion without residual significant spinal canal or neural foraminal stenosis L4-L5: Status post posterior instrumented fusion without residual significant spinal canal or neural foraminal stenosis L5-S1: There is disc bulge eccentric to the left, left worse than right endplate spurring, and bilateral facet arthropathy resulting in mild right worse than left subarticular zone narrowing without evidence of frank nerve root impingement, and  mild-to-moderate left and mild right neural foraminal stenosis. Findings are unchanged. IMPRESSION: 1. Persistent and progressed discitis/osteomyelitis at T11-T12 with worsening endplate destruction and loss of vertebral body height at both levels, worse at T12. 2. Epidural phlegmon is similar to the prior study without discrete abscess. Unchanged moderate spinal canal stenosis and severe bilateral neural foraminal stenosis at T11-T12. 3. Probable associated right-sided septic arthritis at T11-T12, similar to the prior study. No convincing left-sided septic arthritis. 4. No other evidence of infection in the thoracic or lumbar spine. 5. Postsurgical changes reflecting posterior instrumented fusion from L2 through L5 without residual significant spinal canal or neural foraminal stenosis at these levels. 6. 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 7. Disc bulge eccentric to the left at L5-S1 resulting in mild right worse than left subarticular zone narrowing without evidence of frank nerve root impingement, and mild-to-moderate left and mild right neural foraminal stenosis, unchanged. Electronically Signed   By: Lesia Hausen M.D.   On: 02/13/2023 14:44      Assessment/Plan:  INTERVAL HISTORY:  blood and IR guided cultures unrevealing   Principal Problem:   Diskitis Active Problems:   CKD stage 3b, GFR 30-44 ml/min (HCC)   Discitis   Malfunction  of spinal cord stimulator (HCC)   Serratia   Diabetes mellitus without complication (HCC)    Katelyn [name] is a 67 y.o. female with prior postoperative infeciton with Serratia in 2017, more recently. T11-t12 diskitis,osteomyelitis and epidural phlegmon T8-9 thru L1-2 post spinal cord stimulator . Cultures were unrevealing and she received empiric course of vancomycin + cefepime, complicated by vancomycin related renal injury then more recently placed on cipro + doxy but with  worsening pain after stimulator program changed  MRI showing progressive osteomyelitis and diskitis  She is sp IR guided sampling for culture  Dr. Yetta Barre is planning on taking her to the OR on Monday and will be placing new hardware to stablize her spine where there has been progressive destruction.  We would like to continue to hold antibiotics for now in hopes that deep cultures from the OR will help guide pathogen specific therapy  She will need a course of IV antibiotics followed by protracted orals so it would help [place] out if we can pin down the organisms and know which antibiotics are needed.  Certainly she could get peri-operative antibiotics given at time of culture collection   I have personally spent 54 minutes involved in face-to-face and non-face-to-face activities for this patient on the day of the visit. Professional time spent includes the following activities: Preparing to see the patient (review of tests), Obtaining and/or reviewing separately obtained history (admission/discharge record), Performing a medically appropriate examination and/or evaluation , Ordering medications/tests/procedures, referring and communicating with other health care professionals, Documenting clinical information in the EMR, Independently interpreting results (not separately reported), Communicating results to the patient/family/caregiver, Counseling and educating the patient/family/caregiver and Care coordination (not separately reported).     LOS: 1 day   Acey Lav 02/15/2023, 10:46 AM

## 2023-02-15 NOTE — Progress Notes (Addendum)
Daily Progress Note Intern Pager: [phone]  Patient name: Katelyn [name] Medical record number: [id number] Date of birth: 03-13-1956 Age: 67 y.o. Gender: female  Primary Care Provider: Olive Bass, MD Consultants: ID, NSGY Code Status: Full code   Pt Overview and Major Events to Date:  5/7: Admitted to FMTS 5/8: IR aspiration of thoracic spine   Assessment and Plan: Katelyn [name] is a 67 y.o. female presenting with back pain and radiculopathy in setting of discitis/infection. PMH is significant for anxiety, chronic low back pain with multiple lumbar surgeries, type 2 diabetes, obesity, OSA, left bundle branch block, hypertension, hypercholesterolemia, CKD, CAD.   * Diskitis Underwent thoracic spine aspiration yesterday with IR, culture results pending. Once speciation determined, will consider abx therapy per ID. NSGY ordered additional CT spinal imaging that confirmed osteomyelitis with gas formation of T11/12. NSGY considering surgical intervention, likely on Monday will have stabilization of T11/12 region. Pain control improve on current regimen with Oxy 2.5mg  spot dosing but still having some breakthrough pain. -NSGY following, appreciate recs -ID following, appreciate recs -F/u BC and joint aspiration culture -Pain management: Norco 5-325 q6h sch (home dosing), Tylenol 650mg  TID prn -Start Oxycodone 5mg  q6h prn + bowel regimen -PT/OT following  Diabetes mellitus without complication (HCC) BGL will controlled without any insulin. Patient on regular diet, but likely reduced intake compared to home setting. Possibly over treated outpatient. -sSSI with CBG checks  CKD stage 3b, GFR 30-44 ml/min (HCC) Cr continues to improve. Good PO intake, fluids stopped yesterday. -Avoid nephrotoxic agents   Chronic stable conditions: HTN: Continue Carvedilol  HLD: Continue Rosuvastatin  RLS: Continue Pramipexole  Gout: Continue Allopurinol  OSA: ordered CPAP nightly, patient refused    FEN/GI: NPO pending disc aspiration Prophylaxis: Heparin Dispo: Home pending pain management and ID/NSGY input  Subjective:  Patient assessed at bedside, husband present. States her pain has improved although still significant. Reports eating normal meals in the hospital. Normal UOP and recent BM.  Objective: Temp:  [97.4 F (36.3 C)-99.1 F (37.3 C)] 99.1 F (37.3 C) (05/09 1137) Pulse Rate:  [61-89] 85 (05/09 1137) Resp:  [10-18] 16 (05/09 1137) BP: (104-157)/(40-78) 147/78 (05/09 1137) SpO2:  [93 %-100 %] 96 % (05/09 1137) Physical Exam: General: Sitting up in bed, able to rach arms overhead and pull herself up Cardiovascular: RRR without murmur Respiratory: CTAB. Normal WOB on RA Abdomen: Soft, non-tender, non-distended Extremities: No peripheral edema. Clean gauze with tape over mid back and cushion pad over sacral region  Laboratory: Most recent CBC Lab Results  Component Value Date   WBC 4.4 02/15/2023   HGB 9.7 (L) 02/15/2023   HCT 28.8 (L) 02/15/2023   MCV 90.3 02/15/2023   PLT 101 (L) 02/15/2023   Most recent BMP    Latest Ref Rng & Units 02/15/2023    3:47 AM  BMP  Glucose 70 - 99 mg/dL 366   BUN 8 - 23 mg/dL 40   Creatinine 4.40 - 1.00 mg/dL 3.47   Sodium 425 - 956 mmol/L 134   Potassium 3.5 - 5.1 mmol/L 4.4   Chloride 98 - 111 mmol/L 99   CO2 22 - 32 mmol/L 26   Calcium 8.9 - 10.3 mg/dL 9.5     Other pertinent labs: Glu: 83 Vertebral aspiration culture: pending  Imaging/Diagnostic Tests: CT THORACIC SPINE WO CONTRAST Result Date: 02/15/2023 IMPRESSION: 1. Advanced endplate destruction at T11-12 with gas in the disc space, consistent with discitis-osteomyelitis. 2. No acute osseous  abnormality of the lumbar spine. L2-5 fusion with solid arthrodesis. 3. Degenerative findings better characterized on yesterday's lumbar spine MRI. Aortic Atherosclerosis (ICD10-I70.0).   CT LUMBAR SPINE WO CONTRAST Result Date: 02/15/2023 IMPRESSION: 1. Advanced endplate  destruction at T11-12 with gas in the disc space, consistent with discitis-osteomyelitis. 2. No acute osseous abnormality of the lumbar spine. L2-5 fusion with solid arthrodesis. 3. Degenerative findings better characterized on yesterday's lumbar spine MRI. Aortic Atherosclerosis (ICD10-I70.0).   IR THORACIC DISC ASPIRATION W/IMG GUIDE Result Date: 02/14/2023 IMPRESSION: Successful fluoroscopy guided fine-needle aspiration of the T11-12 intervertebral disc. Samples obtained were sent for Gram stain and culture.   Katelyn Fortis, MD 02/15/2023, 11:44 AM  PGY-1, [facility] FPTS Intern pager: [phone], text pages welcome Secure chat group [facility] Teaching Service

## 2023-02-15 NOTE — Progress Notes (Signed)
Patient refusing cpap.

## 2023-02-15 NOTE — Plan of Care (Signed)

## 2023-02-15 NOTE — Progress Notes (Signed)
Patient ID: Katelyn [name], female   DOB: 31-Aug-1956, 67 y.o.   MRN: [id number] Patient seen and examined.  She looks comfortable eating breakfast.  She is moving her legs well.  I reviewed her CT scan of her thoracic and lumbar spine and there is significant destruction of the disc space and the endplates at T11-12 without significant kyphosis at this point.  Unfortunately, there is just not a lot of anterior column support here.  I have recommended surgery to reestablish a posterior tension band by placing pedicle screws T10-T12 and then linking into the L1-2 construct.  We hope to do this Monday if there is staff available to run rooms.  I have tried to explain the surgery to her as best I can.  She understands the risk of the surgery include but are not limited to bleeding, infection, nerve injury, spinal cord injury, CSF leak, numbness, weakness, paralysis, loss of bowel bladder or sexual function, pseudoarthrosis, misplaced hardware, failure of hardware, pseudoarthrosis, need for further surgery, lack of relief of symptoms, worsening symptoms, and anesthesia risk including DVT pneumonia MI and death.  She agrees to proceed.  She would like to have her spinal cord stimulator removed as she feels like it really does not help her pain.  This is something we will consider and discuss more before her surgery.

## 2023-02-16 LAB — CBC
HCT: 33.5 % — ABNORMAL LOW (ref 36.0–46.0)
Hemoglobin: 11 g/dL — ABNORMAL LOW (ref 12.0–15.0)
MCH: 30.2 pg (ref 26.0–34.0)
MCHC: 32.8 g/dL (ref 30.0–36.0)
MCV: 92 fL (ref 80.0–100.0)
Platelets: 106 10*3/uL — ABNORMAL LOW (ref 150–400)
RBC: 3.64 MIL/uL — ABNORMAL LOW (ref 3.87–5.11)
RDW: 14.9 % (ref 11.5–15.5)
WBC: 4.6 10*3/uL (ref 4.0–10.5)
nRBC: 0 % (ref 0.0–0.2)

## 2023-02-16 LAB — BASIC METABOLIC PANEL
Anion gap: 10 (ref 5–15)
BUN: 32 mg/dL — ABNORMAL HIGH (ref 8–23)
CO2: 26 mmol/L (ref 22–32)
Calcium: 9.7 mg/dL (ref 8.9–10.3)
Chloride: 99 mmol/L (ref 98–111)
Creatinine, Ser: 1.39 mg/dL — ABNORMAL HIGH (ref 0.44–1.00)
GFR, Estimated: 42 mL/min — ABNORMAL LOW (ref >60)
Glucose, Bld: 104 mg/dL — ABNORMAL HIGH (ref 70–99)
Potassium: 4.6 mmol/L (ref 3.5–5.1)
Sodium: 135 mmol/L (ref 135–145)

## 2023-02-16 LAB — GLUCOSE, CAPILLARY
Glucose-Capillary: 108 mg/dL — ABNORMAL HIGH (ref 70–99)
Glucose-Capillary: 179 mg/dL — ABNORMAL HIGH (ref 70–99)
Glucose-Capillary: 133 mg/dL — ABNORMAL HIGH (ref 70–99)
Glucose-Capillary: 149 mg/dL — ABNORMAL HIGH (ref 70–99)

## 2023-02-16 LAB — CULTURE, BLOOD (ROUTINE X 2)

## 2023-02-16 LAB — AEROBIC/ANAEROBIC CULTURE W GRAM STAIN (SURGICAL/DEEP WOUND): Gram Stain: NONE SEEN

## 2023-02-16 MED ORDER — HYDROCODONE-ACETAMINOPHEN 5-325 MG PO TABS
2.0000 | ORAL_TABLET | ORAL | Status: DC
  Administered 2023-02-16 – 2023-02-19 (×18): 2 via ORAL
  Filled 2023-02-16 (×19): qty 2

## 2023-02-16 MED ORDER — OXYCODONE HCL 5 MG PO TABS: 5.0000 mg | ORAL_TABLET | ORAL | Status: DC | PRN

## 2023-02-16 MED ORDER — ACETAMINOPHEN 325 MG PO TABS: 650.0000 mg | ORAL_TABLET | Freq: Four times a day (QID) | ORAL | Status: DC

## 2023-02-16 MED ORDER — ENOXAPARIN SODIUM 40 MG/0.4ML IJ SOSY
40.0000 mg | PREFILLED_SYRINGE | INTRAMUSCULAR | Status: DC
  Administered 2023-02-16 – 2023-02-17 (×2): 40 mg via SUBCUTANEOUS
  Filled 2023-02-16 (×2): qty 0.4

## 2023-02-16 MED ORDER — OXYCODONE HCL 5 MG PO TABS
5.0000 mg | ORAL_TABLET | Freq: Once | ORAL | Status: AC
  Administered 2023-02-16: 5 mg via ORAL
  Filled 2023-02-16: qty 1

## 2023-02-16 MED ORDER — SENNA 8.6 MG PO TABS
2.0000 | ORAL_TABLET | Freq: Every day | ORAL | Status: DC
  Administered 2023-02-17 – 2023-02-19 (×3): 17.2 mg via ORAL
  Filled 2023-02-16 (×3): qty 2

## 2023-02-16 NOTE — Progress Notes (Addendum)
Daily Progress Note Intern Pager: [phone]  Patient name: Katelyn [name] Medical record number: [id number] Date of birth: 1956-06-25 Age: 67 y.o. Gender: female  Primary Care Provider: Olive Bass, MD Consultants: ID, NSGY Code Status: Full code   Pt Overview and Major Events to Date:  5/7: Admitted to FMTS 5/8: IR aspiration of thoracic spine   Assessment and Plan: Katelyn [name] is a 67 y.o. female presenting with back pain and radiculopathy in setting of discitis/infection. PMH is significant for anxiety, chronic low back pain with multiple lumbar surgeries, type 2 diabetes, obesity, OSA, left bundle branch block, hypertension, hypercholesterolemia, CKD, CAD.   * Diskitis Joint aspiration culture showed no growth @ 24 hours. Appreciate ID input about abx selection pending culture results. Plan for NSGY intervention on 5/13. Pain better controlled with home regimen and Oxy prn dosing but still having breakthrough pain. -NSGY following, appreciate recs -ID following, appreciate recs -F/u BC and joint aspiration culture -Pain management: consolidate to Norco 10-650 q4h scheduled -Continue bowel regimen with Miralax and Senna 2 tabs daily -PT/OT following  Diabetes mellitus without complication (HCC) BGL mostly in 100s. Received only 4U SSI yesterday. Fasting BGL well controlled, no indication for LAI at this time. -sSSI with CBG checks  CKD stage 3b, GFR 30-44 ml/min (HCC)-resolved as of 02/16/2023 Kidney function appears to be at baseline. Continue to avoid nephrotoxic agents   Chronic stable conditions: HTN: Continue Carvedilol  HLD: Continue Rosuvastatin  RLS: Continue Pramipexole  Gout: Continue Allopurinol  OSA: ordered CPAP nightly, patient refused   FEN/GI: Regular diet Prophylaxis: Lovenox Dispo: Home pending NSGY procedure on 5/12  Subjective:  Patient assessed at bedside with husband present. States she has been on more pain this AM after getting up with  assistance yesterday. Does not feel something acutely changed in her  back just having more pain. Would like to increase Oxy frequency as she is having pain between doses. Peeing okay. Tried to BM overnight but unable.  Objective: Temp:  [98.1 F (36.7 C)-98.4 F (36.9 C)] 98.2 F (36.8 C) (05/10 1208) Pulse Rate:  [68-78] 68 (05/10 1208) Resp:  [16-19] 19 (05/10 1208) BP: (131-173)/(51-71) 153/66 (05/10 1208) SpO2:  [96 %-99 %] 97 % (05/10 1208) Physical Exam: General: Laying in bed, appears rigid, NAD Cardiovascular: RRR without murmur Respiratory: CTAB. Normal WOB on RA Abdomen: Soft, non-tender, non-distended Extremities: No peripheral edema. No surrounding erythema, edema or drainage over spine.  Laboratory: Most recent CBC Lab Results  Component Value Date   WBC 4.6 02/16/2023   HGB 11.0 (L) 02/16/2023   HCT 33.5 (L) 02/16/2023   MCV 92.0 02/16/2023   PLT 106 (L) 02/16/2023   Most recent BMP    Latest Ref Rng & Units 02/16/2023    6:28 AM  BMP  Glucose 70 - 99 mg/dL 147   BUN 8 - 23 mg/dL 32   Creatinine 8.29 - 1.00 mg/dL 5.62   Sodium 130 - 865 mmol/L 135   Potassium 3.5 - 5.1 mmol/L 4.6   Chloride 98 - 111 mmol/L 99   CO2 22 - 32 mmol/L 26   Calcium 8.9 - 10.3 mg/dL 9.7     Other pertinent labs: Disc space culture: no growth @ 24 hours BC no growth @ 2 days Glu: 108   [name], MD 02/16/2023, 12:34 PM  PGY-1, [facility] FPTS Intern pager: [phone], text pages welcome Secure chat group [facility] Teaching Service

## 2023-02-16 NOTE — Progress Notes (Signed)
Patient ID: Katelyn [name], female   DOB: 1956/05/25, 67 y.o.   MRN: [id number] Seen and examined.  She looks comfortable.  She is eating dinner.  She is ready for surgery on Monday.  N.p.o. after midnight Sunday night.  Fortunes been encouraged and answered.  She really wants the spinal cord stimulator taken out.

## 2023-02-16 NOTE — Care Management Important Message (Signed)
Important Message  Patient Details  Name: Katelyn [name] MRN: [id number] Date of Birth: 13-Sep-1956   Medicare Important Message Given:  Yes     Dorena Bodo 02/16/2023, 3:24 PM

## 2023-02-16 NOTE — TOC Progression Note (Signed)
Transition of Care [facility]) - Progression Note    Patient Details  Name: Katelyn [name] MRN: [id number] Date of Birth: Aug 28, 1956  Transition of Care [facility]) CM/SW Contact  Kermit Balo, RN Phone Number: 02/16/2023, 10:50 AM  Clinical Narrative:    Plan is for OR on Monday. TOC following.   Expected Discharge Plan: Home w Home Health Services Barriers to Discharge: Continued Medical Work up  Expected Discharge Plan and Services   Discharge Planning Services: CM Consult   Living arrangements for the past 2 months: Single Family Home                                       Social Determinants of Health (SDOH) Interventions SDOH Screenings   Food Insecurity: No Food Insecurity (02/13/2023)  Housing: Low Risk  (02/13/2023)  Transportation Needs: No Transportation Needs (02/13/2023)  Utilities: Not At Risk (02/13/2023)  Depression (PHQ2-9): Low Risk  (02/08/2023)  Tobacco Use: Low Risk  (02/14/2023)    Readmission Risk Interventions     No data to display

## 2023-02-16 NOTE — Evaluation (Signed)
Occupational Therapy Evaluation Patient Details Name: Katelyn [name] MRN: [id number] DOB: [birth year] Today's Date: 02/16/2023   History of Present Illness Patient is a 67 y/o female admitted 02/13/23 with back pain and radiculopathy.  She was found to have discitis/infection.  PMH positive for anxiety, chronic low back pain with multiple lumbar surgeries (most recent in Dec with spinal stimulator), DM2, obesity, OSA, LBBB, HTN, hypercholesterolemia, CKD, CAD.   Clinical Impression   Patient seen at bed level due to awaiting surgery for spine stabilization on Monday per Dr. Yetta Barre. She reports getting to [facility] with help earlier, OT orders at bed level.  Patient completing bed level ADLs with setup to max assist, pt reports managing bed mobility with increased time but no assist (with rolling and pulling to [facility]).  Provided theraband and exercises for UB strengthening while awaiting surgery.  Will follow and see post surgery, to re-assess post dc therapy needs and DME.       Recommendations for follow up therapy are one component of a multi-disciplinary discharge planning process, led by the attending physician.  Recommendations may be updated based on patient status, additional functional criteria and insurance authorization.   Assistance Recommended at Discharge  (TBD)  Patient can return home with the following  (TBD)    Functional Status Assessment  Patient has had a recent decline in their functional status and demonstrates the ability to make significant improvements in function in a reasonable and predictable amount of time.  Equipment Recommendations  Other (comment) (TBD)    Recommendations for Other Services       Precautions / Restrictions Precautions Precautions: Back Precaution Booklet Issued: No Precaution Comments: in bed therapy till after surgery Monday for spinal stabilization Restrictions Weight Bearing Restrictions: No      Mobility Bed Mobility Overal bed mobility:  Needs Assistance             General bed mobility comments: reports able to roll in bed and pull self up in bed slowly, limited by pain    Transfers                   General transfer comment: deferred, OT bed level only      Balance                                           ADL either performed or assessed with clinical judgement   ADL Overall ADL's : Needs assistance/impaired     Grooming: Set up;Bed level           Upper Body Dressing : Moderate assistance;Bed level   Lower Body Dressing: Total assistance;Bed level     Toilet Transfer Details (indicate cue type and reason): deferred           General ADL Comments: remained bed level     Vision   Vision Assessment?: No apparent visual deficits     Perception     Praxis      Pertinent Vitals/Pain Pain Assessment Pain Assessment: 0-10 Pain Score: 7  Pain Location: hips/ lower back Pain Descriptors / Indicators: Aching, Burning, Shooting Pain Intervention(s): Limited activity within patient's tolerance, Monitored during session, Repositioned     Hand Dominance Right   Extremity/Trunk Assessment Upper Extremity Assessment Upper Extremity Assessment: Overall WFL for tasks assessed   Lower Extremity Assessment Lower Extremity Assessment: Defer to PT evaluation  Cervical / Trunk Assessment Cervical / Trunk Assessment:  (plan for back surgery monday)   Communication Communication Communication: No difficulties   Cognition Arousal/Alertness: Awake/alert Behavior During Therapy: WFL for tasks assessed/performed Overall Cognitive Status: Within Functional Limits for tasks assessed                                       General Comments  spouse at side and supportive    Exercises Exercises: Other exercises Other Exercises Other Exercises: provided UE theraband (red) and instructed on chest pulls, shoulder flexion and elbow extension exercises.   2x/day 8-10 reps.   Shoulder Instructions      Home Living Family/patient expects to be discharged to:: Private residence Living Arrangements: Spouse/significant other Available Help at Discharge: Family Type of Home: House Home Access: Ramped entrance     Home Layout: One level     Bathroom Shower/Tub: Arts development officer Toilet: Handicapped height     Home Equipment: Agricultural consultant (2 wheels);Rollator (4 wheels);Wheelchair - manual;Shower seat;Grab bars - tub/shower;BSC/3in1;Hand held shower head          Prior Functioning/Environment Prior Level of Function : History of Falls (last six months);Independent/Modified Independent             Mobility Comments: usually ambulates with rollator or wc in home; uses upright walker in commuinty; and 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 fell about a month ago reaching for flash light when she was going over threshold and walker folded ADLs Comments: completes ADLs and IADLs, reports from wc level for cooking/clenaing; driving but not recently        OT Problem List: Decreased activity tolerance;Pain      OT Treatment/Interventions: Self-care/ADL training;Energy conservation;DME and/or AE instruction;Therapeutic activities;Patient/family education;Balance training    OT Goals(Current goals can be found in the care plan section) Acute Rehab OT Goals Patient Stated Goal: less pain OT Goal Formulation: With patient  OT Frequency: Min 2X/week    Co-evaluation              AM-PAC OT "6 Clicks" Daily Activity     Outcome Measure Help from another person eating meals?: None Help from another person taking care of personal grooming?: A Little Help from another person toileting, which includes using toliet, bedpan, or urinal?: Total Help from another person bathing (including washing, rinsing, drying)?: A Lot Help from another person to put on and taking off regular upper body clothing?: A Little Help from another  person to put on and taking off regular lower body clothing?: A Lot 6 Click Score: 15   End of Session Nurse Communication: Mobility status  Activity Tolerance: Patient tolerated treatment well Patient left: in bed;with bed alarm set;with family/visitor present  OT Visit Diagnosis: Muscle weakness (generalized) (M62.81);Pain Pain - part of body:  (back ,hips)                Time: 1610-9604 OT Time Calculation (min): 23 min Charges:  OT General Charges $OT Visit: 1 Visit OT Evaluation $OT Eval Low Complexity: 1 Low  Barry Brunner, OT Acute Rehabilitation Services Office [phone]   Chancy Milroy 02/16/2023, 1:33 PM

## 2023-02-16 NOTE — Progress Notes (Signed)
Pt on room air and stable tolerating with no distress noted. Pt refusing CPAP QHS at this time. Will continue to monitor

## 2023-02-17 DIAGNOSIS — M4645 Discitis, unspecified, thoracolumbar region: Secondary | ICD-10-CM | POA: Diagnosis not present

## 2023-02-17 LAB — GLUCOSE, CAPILLARY
Glucose-Capillary: 107 mg/dL — ABNORMAL HIGH (ref 70–99)
Glucose-Capillary: 185 mg/dL — ABNORMAL HIGH (ref 70–99)
Glucose-Capillary: 135 mg/dL — ABNORMAL HIGH (ref 70–99)
Glucose-Capillary: 197 mg/dL — ABNORMAL HIGH (ref 70–99)

## 2023-02-17 LAB — AEROBIC/ANAEROBIC CULTURE W GRAM STAIN (SURGICAL/DEEP WOUND)

## 2023-02-17 LAB — BASIC METABOLIC PANEL
Anion gap: 10 (ref 5–15)
BUN: 31 mg/dL — ABNORMAL HIGH (ref 8–23)
CO2: 26 mmol/L (ref 22–32)
Calcium: 9.5 mg/dL (ref 8.9–10.3)
Chloride: 97 mmol/L — ABNORMAL LOW (ref 98–111)
Creatinine, Ser: 1.43 mg/dL — ABNORMAL HIGH (ref 0.44–1.00)
GFR, Estimated: 41 mL/min — ABNORMAL LOW (ref >60)
Glucose, Bld: 135 mg/dL — ABNORMAL HIGH (ref 70–99)
Potassium: 4.7 mmol/L (ref 3.5–5.1)
Sodium: 133 mmol/L — ABNORMAL LOW (ref 135–145)

## 2023-02-17 LAB — CBC
HCT: 31 % — ABNORMAL LOW (ref 36.0–46.0)
Hemoglobin: 10.4 g/dL — ABNORMAL LOW (ref 12.0–15.0)
MCH: 30.1 pg (ref 26.0–34.0)
MCHC: 33.5 g/dL (ref 30.0–36.0)
MCV: 89.9 fL (ref 80.0–100.0)
Platelets: 112 10*3/uL — ABNORMAL LOW (ref 150–400)
RBC: 3.45 MIL/uL — ABNORMAL LOW (ref 3.87–5.11)
RDW: 14.9 % (ref 11.5–15.5)
WBC: 4.7 10*3/uL (ref 4.0–10.5)
nRBC: 0 % (ref 0.0–0.2)

## 2023-02-17 LAB — CULTURE, BLOOD (ROUTINE X 2)

## 2023-02-17 MED ORDER — TORSEMIDE 20 MG PO TABS
20.0000 mg | ORAL_TABLET | Freq: Every day | ORAL | Status: DC
  Administered 2023-02-17 – 2023-02-19 (×3): 20 mg via ORAL
  Filled 2023-02-17 (×3): qty 1

## 2023-02-17 MED ORDER — DICLOFENAC SODIUM 1 % EX GEL
4.0000 g | Freq: Four times a day (QID) | CUTANEOUS | Status: DC
  Administered 2023-02-17 (×3): 4 g via TOPICAL
  Filled 2023-02-17: qty 100

## 2023-02-17 NOTE — Progress Notes (Signed)
Pt on room air resting and tolerating with no active distress noted. Pt refuses CPAP QHS. Will continue to monitor

## 2023-02-17 NOTE — Progress Notes (Signed)
     Daily Progress Note Intern Pager: [phone]  Patient name: Katelyn [name] Medical record number: [id number] Date of birth: 1956/01/01 Age: 67 y.o. Gender: female  Primary Care Provider: Olive Bass, MD Consultants: Infectious disease, neurosurgery Code Status: FULL  Pt Overview and Major Events to Date:  5/7: Admitted to FMTS 5/8: IR aspiration of thoracic spine  Assessment and Plan:  Katelyn [name] is a 67 y.o. 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 Pertinent PMH/PSH includes chronic low back pain with multiple lumbar surgeries, T2DM, obesity, OSA, LBBB, HTN, HLD, CAD, CKD stage IIIb, anxiety.   * Diskitis Pain is adequately controlled with recent change in pain regimen. Planning for neurosurgical intervention 5/13. -NSGY following, appreciate recs -ID following, appreciate recs -F/u blood cultures and joint aspiration culture - NGTD -withholding antibiotics until intraoperative cultures are obtained -Pain management: continue Norco 10-650 q4h scheduled -Continue bowel regimen with Miralax and Senna 2 tabs daily -PT/OT  Diabetes mellitus without complication (HCC) Well-controlled with sliding scale insulin. -sSSI with CBG checks -hold home [place]  Other problems chronic and stable: HTN-continue carvedilol Anxiety-continue mirtazapine and duloxetine Gout-continue allopurinol GERD-continue pantoprazole RLS-continue pramipexole OSA-CPAP nightly     FEN/GI: regular PPx: LMWH Dispo:Pending PT recommendations  pending clinical improvement . Barriers include planned surgery 5/13.   Subjective:  NAOE. Patient reports still having some pain in her back but is overall better.  Objective: Temp:  [98 F (36.7 C)-98.3 F (36.8 C)] 98.3 F (36.8 C) (05/11 0411) Pulse Rate:  [68-81] 68 (05/11 0411) Resp:  [16-19] 16 (05/11 0411) BP: (120-160)/(52-73) 160/70 (05/11 0631) SpO2:  [97 %-99 %] 98 % (05/11 0411) Physical Exam: General:  Asleep but awakened easily, NAD, appears comfortable Cardiovascular: RRR, 2/6 systolic murmur best heard at LUSB, S3 gallop present Respiratory: CTAB, no respiratory distress Abdomen: soft, non-tender, +BS Extremities: WWP, no edema, moving lower extremities without difficulty  Laboratory: Most recent CBC Lab Results  Component Value Date   WBC 4.7 02/17/2023   HGB 10.4 (L) 02/17/2023   HCT 31.0 (L) 02/17/2023   MCV 89.9 02/17/2023   PLT 112 (L) 02/17/2023   Most recent BMP    Latest Ref Rng & Units 02/17/2023    3:26 AM  BMP  Glucose 70 - 99 mg/dL 147   BUN 8 - 23 mg/dL 31   Creatinine 8.29 - 1.00 mg/dL 5.62   Sodium 130 - 865 mmol/L 133   Potassium 3.5 - 5.1 mmol/L 4.7   Chloride 98 - 111 mmol/L 97   CO2 22 - 32 mmol/L 26   Calcium 8.9 - 10.3 mg/dL 9.5     Katelyn Deeds, MD 02/17/2023, 6:38 AM  PGY-3, [facility] FPTS Intern pager: [phone], text pages welcome Secure chat group [facility] Teaching Service

## 2023-02-18 LAB — BASIC METABOLIC PANEL
Anion gap: 13 (ref 5–15)
BUN: 39 mg/dL — ABNORMAL HIGH (ref 8–23)
CO2: 25 mmol/L (ref 22–32)
Calcium: 9.8 mg/dL (ref 8.9–10.3)
Chloride: 95 mmol/L — ABNORMAL LOW (ref 98–111)
Creatinine, Ser: 1.53 mg/dL — ABNORMAL HIGH (ref 0.44–1.00)
GFR, Estimated: 38 mL/min — ABNORMAL LOW (ref >60)
Glucose, Bld: 130 mg/dL — ABNORMAL HIGH (ref 70–99)
Potassium: 4.8 mmol/L (ref 3.5–5.1)
Sodium: 133 mmol/L — ABNORMAL LOW (ref 135–145)

## 2023-02-18 LAB — CBC
HCT: 31.6 % — ABNORMAL LOW (ref 36.0–46.0)
Hemoglobin: 10.6 g/dL — ABNORMAL LOW (ref 12.0–15.0)
MCH: 30 pg (ref 26.0–34.0)
MCHC: 33.5 g/dL (ref 30.0–36.0)
MCV: 89.5 fL (ref 80.0–100.0)
Platelets: 119 10*3/uL — ABNORMAL LOW (ref 150–400)
RBC: 3.53 MIL/uL — ABNORMAL LOW (ref 3.87–5.11)
RDW: 14.8 % (ref 11.5–15.5)
WBC: 4.7 10*3/uL (ref 4.0–10.5)
nRBC: 0 % (ref 0.0–0.2)

## 2023-02-18 LAB — GLUCOSE, CAPILLARY
Glucose-Capillary: 131 mg/dL — ABNORMAL HIGH (ref 70–99)
Glucose-Capillary: 154 mg/dL — ABNORMAL HIGH (ref 70–99)
Glucose-Capillary: 172 mg/dL — ABNORMAL HIGH (ref 70–99)

## 2023-02-18 MED ORDER — DICLOFENAC SODIUM 1 % EX GEL
4.0000 g | CUTANEOUS | Status: DC | PRN
  Administered 2023-02-18 – 2023-02-19 (×3): 4 g via TOPICAL

## 2023-02-18 NOTE — Progress Notes (Signed)
     Daily Progress Note Intern Pager: [phone]  Patient name: Katelyn [name] Medical record number: [id number] Date of birth: 03/31/1956 Age: 67 y.o. Gender: female  Primary Care Provider: Olive Bass, MD Consultants: Infectious disease, neurosurgery  Code Status: Full  Pt Overview and Major Events to Date:  5/7: Admitted to FMTS 5/8: IR aspiration of thoracic spine  Assessment and Plan:  Katelyn [name] is a 67 y.o. 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 Pertinent PMH/PSH includes chronic low back pain with multiple lumbar surgeries, T2DM, obesity, OSA, LBBB, HTN, HLD, CAD, CKD stage IIIb, anxiety.    * Diskitis Pain is adequately controlled this am, but having more pain when lidocaine patches finished. Will start Voltaren PRN after lidocaine patches. Planning for neurosurgical intervention 5/13. Afebrile overnight, not on abx at this time, NSGY wanting culture.  -NSGY following, appreciate recs -ID following, appreciate recs -F/u blood cultures and joint aspiration culture - NGTD -withholding antibiotics until intraoperative cultures are obtained -Pain management: continue Norco 10-650 q4h scheduled -Lidocaine patch followed by voltaren gel prn after. -Continue bowel regimen with Miralax and Senna 2 tabs daily -PT/OT  Diabetes mellitus without complication (HCC) Well-controlled with sliding scale insulin. -sSSI with CBG checks -hold home [place]     FEN/GI: Regular PPx: LMWH held for surgery, restart after surgery Dispo:Pending PT recommendations  pending clinical improvement . Barriers include planned surgery 5/13.   Subjective:  Doing well this morning, complaining of low back pain when standing, and pain after lidocaine patches where off.   Objective: Temp:  [97.7 F (36.5 C)-98.2 F (36.8 C)] 97.9 F (36.6 C) (05/12 0358) Pulse Rate:  [68-75] 69 (05/12 0358) Resp:  [15-20] 16 (05/12 0358) BP: (113-152)/(33-81) 134/57 (05/12  0358) SpO2:  [91 %-100 %] 91 % (05/12 0358) Physical Exam: General: NAD, conversant, polite, good mood Cardiovascular: RRR, NR[facility] Respiratory: CTABL Abdomen: Soft, NTTP, non-distended Extremities: moving all extremities, able to stand, strength 5/5 in LE, no edema  Laboratory: Most recent CBC Lab Results  Component Value Date   WBC 4.7 02/18/2023   HGB 10.6 (L) 02/18/2023   HCT 31.6 (L) 02/18/2023   MCV 89.5 02/18/2023   PLT 119 (L) 02/18/2023   Most recent BMP    Latest Ref Rng & Units 02/18/2023    4:09 AM  BMP  Glucose 70 - 99 mg/dL 322   BUN 8 - 23 mg/dL 39   Creatinine 0.25 - 1.00 mg/dL 4.27   Sodium 062 - 376 mmol/L 133   Potassium 3.5 - 5.1 mmol/L 4.8   Chloride 98 - 111 mmol/L 95   CO2 22 - 32 mmol/L 25   Calcium 8.9 - 10.3 mg/dL 9.8     Bess Kinds, MD 02/18/2023, 7:24 AM  PGY-2, [facility] FPTS Intern pager: [phone], text pages welcome Secure chat group [facility] Teaching Service

## 2023-02-18 NOTE — Progress Notes (Signed)
  NEUROSURGERY PROGRESS NOTE   No issues overnight. Pt ready for surgery tomorrow  EXAM:  BP 92/64 (BP Location: Right Arm)   Pulse 76   Temp 97.6 F (36.4 C) (Oral)   Resp 16   Ht 5\' 2"  (1.575 m)   Wt 85.3 kg   SpO2 98%   BMI 34.40 kg/m   Awake, alert, oriented  Speech fluent, appropriate  CN grossly intact  5/5 BUE/BLE   IMPRESSION:  67 y.o. female with thoracic osteo/discitis requiring operative stabilization.  PLAN: - NPOpMN - Plan on extension of instrumented stabilization/fusion tomorrow with Dr. Alfonse Ras, MD [facility]

## 2023-02-19 ENCOUNTER — Ambulatory Visit: Payer: PPO | Admitting: Cardiology

## 2023-02-19 ENCOUNTER — Inpatient Hospital Stay (HOSPITAL_COMMUNITY): Payer: PPO | Admitting: Anesthesiology

## 2023-02-19 ENCOUNTER — Encounter (HOSPITAL_COMMUNITY): Payer: Self-pay | Admitting: Student

## 2023-02-19 ENCOUNTER — Other Ambulatory Visit: Payer: Self-pay

## 2023-02-19 ENCOUNTER — Encounter (HOSPITAL_COMMUNITY)
Admission: EM | Disposition: A | Discharge status: Home Health | Payer: Self-pay | Source: Home / Self Care | Attending: Family Medicine

## 2023-02-19 ENCOUNTER — Inpatient Hospital Stay (HOSPITAL_COMMUNITY): Payer: PPO

## 2023-02-19 DIAGNOSIS — M532X4 Spinal instabilities, thoracic region: Secondary | ICD-10-CM

## 2023-02-19 DIAGNOSIS — I10 Essential (primary) hypertension: Secondary | ICD-10-CM

## 2023-02-19 DIAGNOSIS — M4645 Discitis, unspecified, thoracolumbar region: Secondary | ICD-10-CM | POA: Diagnosis not present

## 2023-02-19 DIAGNOSIS — M4644 Discitis, unspecified, thoracic region: Secondary | ICD-10-CM | POA: Diagnosis not present

## 2023-02-19 DIAGNOSIS — T85192D Other mechanical complication of implanted electronic neurostimulator (electrode) of spinal cord, subsequent encounter: Secondary | ICD-10-CM | POA: Diagnosis not present

## 2023-02-19 DIAGNOSIS — E1151 Type 2 diabetes mellitus with diabetic peripheral angiopathy without gangrene: Secondary | ICD-10-CM

## 2023-02-19 DIAGNOSIS — Z981 Arthrodesis status: Secondary | ICD-10-CM

## 2023-02-19 DIAGNOSIS — J449 Chronic obstructive pulmonary disease, unspecified: Secondary | ICD-10-CM

## 2023-02-19 DIAGNOSIS — Z794 Long term (current) use of insulin: Secondary | ICD-10-CM

## 2023-02-19 DIAGNOSIS — E119 Type 2 diabetes mellitus without complications: Secondary | ICD-10-CM | POA: Diagnosis not present

## 2023-02-19 DIAGNOSIS — M4804 Spinal stenosis, thoracic region: Secondary | ICD-10-CM

## 2023-02-19 HISTORY — PX: SPINAL CORD STIMULATOR REMOVAL: SHX5379

## 2023-02-19 HISTORY — PX: LAMINECTOMY WITH POSTERIOR LATERAL ARTHRODESIS LEVEL 4: SHX6338

## 2023-02-19 LAB — PROTIME-INR
INR: 1.1 (ref 0.8–1.2)
Prothrombin Time: 14.1 seconds (ref 11.4–15.2)

## 2023-02-19 LAB — CBC
HCT: 31.1 % — ABNORMAL LOW (ref 36.0–46.0)
Hemoglobin: 10.3 g/dL — ABNORMAL LOW (ref 12.0–15.0)
MCH: 29.9 pg (ref 26.0–34.0)
MCHC: 33.1 g/dL (ref 30.0–36.0)
MCV: 90.4 fL (ref 80.0–100.0)
Platelets: 110 10*3/uL — ABNORMAL LOW (ref 150–400)
RBC: 3.44 MIL/uL — ABNORMAL LOW (ref 3.87–5.11)
RDW: 14.8 % (ref 11.5–15.5)
WBC: 6.3 10*3/uL (ref 4.0–10.5)
nRBC: 0 % (ref 0.0–0.2)

## 2023-02-19 LAB — BASIC METABOLIC PANEL
Anion gap: 10 (ref 5–15)
BUN: 53 mg/dL — ABNORMAL HIGH (ref 8–23)
CO2: 26 mmol/L (ref 22–32)
Calcium: 9.3 mg/dL (ref 8.9–10.3)
Chloride: 94 mmol/L — ABNORMAL LOW (ref 98–111)
Creatinine, Ser: 2.03 mg/dL — ABNORMAL HIGH (ref 0.44–1.00)
GFR, Estimated: 27 mL/min — ABNORMAL LOW (ref >60)
Glucose, Bld: 170 mg/dL — ABNORMAL HIGH (ref 70–99)
Potassium: 4.9 mmol/L (ref 3.5–5.1)
Sodium: 130 mmol/L — ABNORMAL LOW (ref 135–145)

## 2023-02-19 LAB — GLUCOSE, CAPILLARY
Glucose-Capillary: 150 mg/dL — ABNORMAL HIGH (ref 70–99)
Glucose-Capillary: 162 mg/dL — ABNORMAL HIGH (ref 70–99)
Glucose-Capillary: 143 mg/dL — ABNORMAL HIGH (ref 70–99)
Glucose-Capillary: 145 mg/dL — ABNORMAL HIGH (ref 70–99)
Glucose-Capillary: 186 mg/dL — ABNORMAL HIGH (ref 70–99)

## 2023-02-19 LAB — TYPE AND SCREEN

## 2023-02-19 LAB — AEROBIC/ANAEROBIC CULTURE W GRAM STAIN (SURGICAL/DEEP WOUND)

## 2023-02-19 LAB — SURGICAL PCR SCREEN
MRSA, PCR: NEGATIVE
Staphylococcus aureus: NEGATIVE

## 2023-02-19 SURGERY — LAMINECTOMY WITH POSTERIOR LATERAL ARTHRODESIS LEVEL 4
Anesthesia: General | Site: Spine Thoracic

## 2023-02-19 MED ORDER — CHLORHEXIDINE GLUCONATE CLOTH 2 % EX PADS: 6.0000 | MEDICATED_PAD | Freq: Once | CUTANEOUS | Status: DC

## 2023-02-19 MED ORDER — VASOPRESSIN 20 UNIT/ML IV SOLN
INTRAVENOUS | Status: DC | PRN
  Administered 2023-02-19 (×2): 1 [IU] via INTRAVENOUS

## 2023-02-19 MED ORDER — HYDROMORPHONE HCL 1 MG/ML IJ SOLN
0.2500 mg | INTRAMUSCULAR | Status: DC | PRN
  Administered 2023-02-19 (×2): 0.5 mg via INTRAVENOUS

## 2023-02-19 MED ORDER — PROPOFOL 10 MG/ML IV BOLUS
INTRAVENOUS | Status: AC
  Filled 2023-02-19: qty 20

## 2023-02-19 MED ORDER — LIDOCAINE 2% (20 MG/ML) 5 ML SYRINGE
INTRAMUSCULAR | Status: DC | PRN
  Administered 2023-02-19: 20 mg via INTRAVENOUS

## 2023-02-19 MED ORDER — ONDANSETRON HCL 4 MG/2ML IJ SOLN
INTRAMUSCULAR | Status: DC | PRN
  Administered 2023-02-19: 4 mg via INTRAVENOUS

## 2023-02-19 MED ORDER — CHLORHEXIDINE GLUCONATE 0.12 % MT SOLN
OROMUCOSAL | Status: AC
  Administered 2023-02-19: 15 mL via OROMUCOSAL
  Filled 2023-02-19: qty 15

## 2023-02-19 MED ORDER — SODIUM CHLORIDE 0.9 % IV SOLN: INTRAVENOUS | Status: DC

## 2023-02-19 MED ORDER — SODIUM CHLORIDE 0.9 % IV SOLN: 250.0000 mL | INTRAVENOUS | Status: DC

## 2023-02-19 MED ORDER — CEFAZOLIN SODIUM-DEXTROSE 2-4 GM/100ML-% IV SOLN
2.0000 g | INTRAVENOUS | Status: AC
  Administered 2023-02-19: 2 g via INTRAVENOUS
  Filled 2023-02-19: qty 100

## 2023-02-19 MED ORDER — ALBUMIN HUMAN 5 % IV SOLN: INTRAVENOUS | Status: DC | PRN

## 2023-02-19 MED ORDER — VASOPRESSIN 20 UNIT/ML IV SOLN
INTRAVENOUS | Status: AC
  Filled 2023-02-19: qty 1

## 2023-02-19 MED ORDER — MIDAZOLAM HCL 2 MG/2ML IJ SOLN
INTRAMUSCULAR | Status: DC | PRN
  Administered 2023-02-19: 1 mg via INTRAVENOUS

## 2023-02-19 MED ORDER — PHENYLEPHRINE HCL-NACL 20-0.9 MG/250ML-% IV SOLN
INTRAVENOUS | Status: DC | PRN
  Administered 2023-02-19: 50 ug/min via INTRAVENOUS

## 2023-02-19 MED ORDER — MIDAZOLAM HCL 2 MG/2ML IJ SOLN
INTRAMUSCULAR | Status: AC
  Filled 2023-02-19: qty 2

## 2023-02-19 MED ORDER — ACETAMINOPHEN 500 MG PO TABS
ORAL_TABLET | ORAL | Status: AC
  Filled 2023-02-19: qty 2

## 2023-02-19 MED ORDER — PHENYLEPHRINE 80 MCG/ML (10ML) SYRINGE FOR IV PUSH (FOR BLOOD PRESSURE SUPPORT)
PREFILLED_SYRINGE | INTRAVENOUS | Status: DC | PRN
  Administered 2023-02-19: 160 ug via INTRAVENOUS
  Administered 2023-02-19: 200 ug via INTRAVENOUS
  Administered 2023-02-19: 120 ug via INTRAVENOUS

## 2023-02-19 MED ORDER — EPHEDRINE SULFATE-NACL 50-0.9 MG/10ML-% IV SOSY
PREFILLED_SYRINGE | INTRAVENOUS | Status: DC | PRN
  Administered 2023-02-19 (×2): 10 mg via INTRAVENOUS

## 2023-02-19 MED ORDER — THROMBIN 5000 UNITS EX SOLR: OROMUCOSAL | Status: DC | PRN

## 2023-02-19 MED ORDER — THROMBIN 20000 UNITS EX SOLR
CUTANEOUS | Status: AC
  Filled 2023-02-19: qty 20000

## 2023-02-19 MED ORDER — PROMETHAZINE HCL 25 MG/ML IJ SOLN: 6.2500 mg | INTRAMUSCULAR | Status: DC | PRN

## 2023-02-19 MED ORDER — OXYCODONE HCL 5 MG/5ML PO SOLN: 5.0000 mg | Freq: Once | ORAL | Status: DC | PRN

## 2023-02-19 MED ORDER — ROCURONIUM BROMIDE 10 MG/ML (PF) SYRINGE
PREFILLED_SYRINGE | INTRAVENOUS | Status: AC
  Filled 2023-02-19: qty 10

## 2023-02-19 MED ORDER — POTASSIUM CHLORIDE IN NACL 20-0.9 MEQ/L-% IV SOLN
INTRAVENOUS | Status: DC
  Filled 2023-02-19: qty 1000

## 2023-02-19 MED ORDER — SODIUM CHLORIDE 0.9% FLUSH
3.0000 mL | Freq: Two times a day (BID) | INTRAVENOUS | Status: DC
  Administered 2023-02-19 – 2023-02-21 (×5): 3 mL via INTRAVENOUS

## 2023-02-19 MED ORDER — METHOCARBAMOL 500 MG PO TABS
500.0000 mg | ORAL_TABLET | Freq: Four times a day (QID) | ORAL | Status: DC | PRN
  Administered 2023-02-19 – 2023-02-20 (×3): 500 mg via ORAL
  Filled 2023-02-19 (×3): qty 1

## 2023-02-19 MED ORDER — GABAPENTIN 300 MG PO CAPS
300.0000 mg | ORAL_CAPSULE | ORAL | Status: DC
  Filled 2023-02-19: qty 1

## 2023-02-19 MED ORDER — ORAL CARE MOUTH RINSE: 15.0000 mL | Freq: Once | OROMUCOSAL | Status: AC

## 2023-02-19 MED ORDER — ROCURONIUM BROMIDE 10 MG/ML (PF) SYRINGE
PREFILLED_SYRINGE | INTRAVENOUS | Status: DC | PRN
  Administered 2023-02-19: 30 mg via INTRAVENOUS
  Administered 2023-02-19 (×2): 20 mg via INTRAVENOUS
  Administered 2023-02-19: 50 mg via INTRAVENOUS

## 2023-02-19 MED ORDER — SODIUM CHLORIDE 0.9% FLUSH: 3.0000 mL | INTRAVENOUS | Status: DC | PRN

## 2023-02-19 MED ORDER — SENNA 8.6 MG PO TABS
1.0000 | ORAL_TABLET | Freq: Two times a day (BID) | ORAL | Status: DC
  Administered 2023-02-19 – 2023-02-22 (×6): 8.6 mg via ORAL
  Filled 2023-02-19 (×6): qty 1

## 2023-02-19 MED ORDER — OXYCODONE HCL 5 MG PO TABS: 5.0000 mg | ORAL_TABLET | Freq: Once | ORAL | Status: DC | PRN

## 2023-02-19 MED ORDER — THROMBIN 5000 UNITS EX SOLR
CUTANEOUS | Status: AC
  Filled 2023-02-19: qty 5000

## 2023-02-19 MED ORDER — FENTANYL CITRATE (PF) 250 MCG/5ML IJ SOLN
INTRAMUSCULAR | Status: AC
  Filled 2023-02-19: qty 5

## 2023-02-19 MED ORDER — BUPIVACAINE HCL (PF) 0.25 % IJ SOLN
INTRAMUSCULAR | Status: DC | PRN
  Administered 2023-02-19: 10 mL

## 2023-02-19 MED ORDER — CELECOXIB 200 MG PO CAPS
200.0000 mg | ORAL_CAPSULE | Freq: Two times a day (BID) | ORAL | Status: DC
  Administered 2023-02-19: 200 mg via ORAL
  Filled 2023-02-19: qty 1

## 2023-02-19 MED ORDER — CHLORHEXIDINE GLUCONATE CLOTH 2 % EX PADS
6.0000 | MEDICATED_PAD | Freq: Once | CUTANEOUS | Status: AC
  Administered 2023-02-19: 6 via TOPICAL

## 2023-02-19 MED ORDER — LACTATED RINGERS IV SOLN: INTRAVENOUS | Status: DC | PRN

## 2023-02-19 MED ORDER — MENTHOL 3 MG MT LOZG
1.0000 | LOZENGE | OROMUCOSAL | Status: DC | PRN
  Filled 2023-02-19: qty 9

## 2023-02-19 MED ORDER — 0.9 % SODIUM CHLORIDE (POUR BTL) OPTIME
TOPICAL | Status: DC | PRN
  Administered 2023-02-19: 1000 mL

## 2023-02-19 MED ORDER — FENTANYL CITRATE (PF) 250 MCG/5ML IJ SOLN
INTRAMUSCULAR | Status: DC | PRN
  Administered 2023-02-19: 150 ug via INTRAVENOUS
  Administered 2023-02-19 (×2): 25 ug via INTRAVENOUS
  Administered 2023-02-19: 50 ug via INTRAVENOUS

## 2023-02-19 MED ORDER — OXYCODONE HCL 5 MG PO TABS
10.0000 mg | ORAL_TABLET | ORAL | Status: DC | PRN
  Administered 2023-02-20: 10 mg via ORAL
  Filled 2023-02-19: qty 2

## 2023-02-19 MED ORDER — CHLORHEXIDINE GLUCONATE 0.12 % MT SOLN: 15.0000 mL | Freq: Once | OROMUCOSAL | Status: AC

## 2023-02-19 MED ORDER — BUPIVACAINE HCL (PF) 0.25 % IJ SOLN
INTRAMUSCULAR | Status: AC
  Filled 2023-02-19: qty 30

## 2023-02-19 MED ORDER — SUGAMMADEX SODIUM 200 MG/2ML IV SOLN
INTRAVENOUS | Status: DC | PRN
  Administered 2023-02-19 (×2): 100 mg via INTRAVENOUS

## 2023-02-19 MED ORDER — ONDANSETRON HCL 4 MG PO TABS: 4.0000 mg | ORAL_TABLET | Freq: Four times a day (QID) | ORAL | Status: DC | PRN

## 2023-02-19 MED ORDER — INSULIN ASPART 100 UNIT/ML IJ SOLN: 0.0000 [IU] | INTRAMUSCULAR | Status: DC | PRN

## 2023-02-19 MED ORDER — ACETAMINOPHEN 500 MG PO TABS
1000.0000 mg | ORAL_TABLET | Freq: Four times a day (QID) | ORAL | Status: AC
  Administered 2023-02-19 – 2023-02-20 (×4): 1000 mg via ORAL
  Filled 2023-02-19 (×4): qty 2

## 2023-02-19 MED ORDER — OXYCODONE HCL 5 MG PO TABS
2.5000 mg | ORAL_TABLET | Freq: Once | ORAL | Status: AC
  Administered 2023-02-19: 2.5 mg via ORAL
  Filled 2023-02-19: qty 1

## 2023-02-19 MED ORDER — THROMBIN 20000 UNITS EX SOLR: CUTANEOUS | Status: DC | PRN

## 2023-02-19 MED ORDER — PHENOL 1.4 % MT LIQD: 1.0000 | OROMUCOSAL | Status: DC | PRN

## 2023-02-19 MED ORDER — CEFAZOLIN SODIUM-DEXTROSE 2-4 GM/100ML-% IV SOLN
2.0000 g | Freq: Three times a day (TID) | INTRAVENOUS | Status: AC
  Administered 2023-02-19 – 2023-02-20 (×2): 2 g via INTRAVENOUS
  Filled 2023-02-19 (×2): qty 100

## 2023-02-19 MED ORDER — METHOCARBAMOL 1000 MG/10ML IJ SOLN: 500.0000 mg | Freq: Four times a day (QID) | INTRAVENOUS | Status: DC | PRN

## 2023-02-19 MED ORDER — HYDROMORPHONE HCL 1 MG/ML IJ SOLN
INTRAMUSCULAR | Status: AC
  Filled 2023-02-19: qty 1

## 2023-02-19 MED ORDER — ONDANSETRON HCL 4 MG/2ML IJ SOLN: 4.0000 mg | Freq: Four times a day (QID) | INTRAMUSCULAR | Status: DC | PRN

## 2023-02-19 MED ORDER — LIDOCAINE 2% (20 MG/ML) 5 ML SYRINGE
INTRAMUSCULAR | Status: AC
  Filled 2023-02-19: qty 5

## 2023-02-19 MED ORDER — MORPHINE SULFATE (PF) 2 MG/ML IV SOLN
2.0000 mg | INTRAVENOUS | Status: DC | PRN
  Administered 2023-02-19 – 2023-02-20 (×3): 2 mg via INTRAVENOUS
  Filled 2023-02-19 (×3): qty 1

## 2023-02-19 MED ORDER — DEXAMETHASONE SODIUM PHOSPHATE 10 MG/ML IJ SOLN
INTRAMUSCULAR | Status: AC
  Filled 2023-02-19: qty 1

## 2023-02-19 MED ORDER — PROPOFOL 10 MG/ML IV BOLUS
INTRAVENOUS | Status: DC | PRN
  Administered 2023-02-19: 100 mg via INTRAVENOUS

## 2023-02-19 MED ORDER — DEXAMETHASONE SODIUM PHOSPHATE 10 MG/ML IJ SOLN
INTRAMUSCULAR | Status: DC | PRN
  Administered 2023-02-19: 5 mg via INTRAVENOUS

## 2023-02-19 MED ORDER — ACETAMINOPHEN 500 MG PO TABS: 1000.0000 mg | ORAL_TABLET | ORAL | Status: DC

## 2023-02-19 MED ORDER — MIDAZOLAM HCL 2 MG/2ML IJ SOLN: 0.5000 mg | Freq: Once | INTRAMUSCULAR | Status: DC | PRN

## 2023-02-19 MED ORDER — INSULIN ASPART 100 UNIT/ML IJ SOLN
0.0000 [IU] | Freq: Three times a day (TID) | INTRAMUSCULAR | Status: DC
  Administered 2023-02-20: 8 [IU] via SUBCUTANEOUS
  Administered 2023-02-20: 3 [IU] via SUBCUTANEOUS
  Administered 2023-02-20: 8 [IU] via SUBCUTANEOUS
  Administered 2023-02-21: 11 [IU] via SUBCUTANEOUS
  Administered 2023-02-21 – 2023-02-22 (×5): 3 [IU] via SUBCUTANEOUS

## 2023-02-19 MED ORDER — ONDANSETRON HCL 4 MG/2ML IJ SOLN
INTRAMUSCULAR | Status: AC
  Filled 2023-02-19: qty 2

## 2023-02-19 SURGICAL SUPPLY — 56 items
APL SKNCLS STERI-STRIP NONHPOA (GAUZE/BANDAGES/DRESSINGS) ×2
BAG COUNTER SPONGE SURGICOUNT (BAG) ×2 IMPLANT
BAG SPNG CNTER NS LX DISP (BAG) ×4
BASKET BONE COLLECTION (BASKET) IMPLANT
BENZOIN TINCTURE PRP APPL 2/3 (GAUZE/BANDAGES/DRESSINGS) ×2 IMPLANT
BONE FIBERS PLIAFX 10 (Bone Implant) ×4 IMPLANT
BUR CARBIDE MATCH 3.0 (BURR) ×2 IMPLANT
CANISTER SUCT 3000ML PPV (MISCELLANEOUS) ×2 IMPLANT
CNTNR URN SCR LID CUP LEK RST (MISCELLANEOUS) ×2 IMPLANT
CONT SPEC 4OZ STRL OR WHT (MISCELLANEOUS) ×2
COVER BACK TABLE 60X90IN (DRAPES) ×2 IMPLANT
DRAPE C-ARM 42X72 X-RAY (DRAPES) IMPLANT
DRAPE C-ARMOR (DRAPES) IMPLANT
DRAPE LAPAROTOMY 100X72X124 (DRAPES) ×2 IMPLANT
DRAPE SURG 17X23 STRL (DRAPES) ×2 IMPLANT
DURAPREP 26ML APPLICATOR (WOUND CARE) ×2 IMPLANT
ELECT REM PT RETURN 9FT ADLT (ELECTROSURGICAL) ×2
ELECTRODE REM PT RTRN 9FT ADLT (ELECTROSURGICAL) ×2 IMPLANT
EVACUATOR 1/8 PVC DRAIN (DRAIN) IMPLANT
GAUZE 4X4 16PLY ~~LOC~~+RFID DBL (SPONGE) IMPLANT
GLOVE BIO SURGEON STRL SZ7 (GLOVE) IMPLANT
GLOVE BIO SURGEON STRL SZ8 (GLOVE) ×4 IMPLANT
GLOVE BIOGEL PI IND STRL 7.0 (GLOVE) IMPLANT
GOWN STRL REUS W/ TWL LRG LVL3 (GOWN DISPOSABLE) IMPLANT
GOWN STRL REUS W/ TWL XL LVL3 (GOWN DISPOSABLE) ×4 IMPLANT
GOWN STRL REUS W/TWL LRG LVL3 (GOWN DISPOSABLE) ×4
GOWN STRL REUS W/TWL XL LVL3 (GOWN DISPOSABLE) ×8
GRAFT BNE FBR PLIAFX PRIME 10 (Bone Implant) IMPLANT
GRAFT BONE PROTEIOS LRG 5CC (Orthopedic Implant) IMPLANT
HEMOSTAT POWDER KIT SURGIFOAM (HEMOSTASIS) IMPLANT
KIT BASIN OR (CUSTOM PROCEDURE TRAY) ×2 IMPLANT
KIT TURNOVER KIT B (KITS) ×2 IMPLANT
NDL HYPO 25X1 1.5 SAFETY (NEEDLE) ×2 IMPLANT
NEEDLE HYPO 25X1 1.5 SAFETY (NEEDLE) ×2 IMPLANT
NS IRRIG 1000ML POUR BTL (IV SOLUTION) ×2 IMPLANT
PACK LAMINECTOMY NEURO (CUSTOM PROCEDURE TRAY) ×2 IMPLANT
PAD ARMBOARD 7.5X6 YLW CONV (MISCELLANEOUS) ×6 IMPLANT
PUTTY DBM 10CC (Putty) IMPLANT
ROD KODIAK 5.5X300 (Rod) IMPLANT
SCREW ILIAC PA 5.5X40 (Screw) IMPLANT
SCREW KODIAK 5.5X45 (Screw) IMPLANT
SCREW KODIAK 6.5X40 (Screw) IMPLANT
SCREW PA INVICTUS 5.5X35 (Screw) IMPLANT
SET SCREW (Screw) ×20 IMPLANT
SET SCREW SPNE (Screw) IMPLANT
SPONGE SURGIFOAM ABS GEL 100 (HEMOSTASIS) ×2 IMPLANT
SPONGE T-LAP 4X18 ~~LOC~~+RFID (SPONGE) IMPLANT
STRIP CLOSURE SKIN 1/2X4 (GAUZE/BANDAGES/DRESSINGS) ×4 IMPLANT
SUT VIC AB 0 CT1 18XCR BRD8 (SUTURE) ×2 IMPLANT
SUT VIC AB 0 CT1 8-18 (SUTURE) ×6
SUT VIC AB 2-0 CP2 18 (SUTURE) ×2 IMPLANT
SUT VIC AB 3-0 SH 8-18 (SUTURE) ×4 IMPLANT
TOWEL GREEN STERILE (TOWEL DISPOSABLE) ×2 IMPLANT
TOWEL GREEN STERILE FF (TOWEL DISPOSABLE) ×2 IMPLANT
TRAY FOLEY MTR SLVR 16FR STAT (SET/KITS/TRAYS/PACK) IMPLANT
WATER STERILE IRR 1000ML POUR (IV SOLUTION) ×2 IMPLANT

## 2023-02-19 NOTE — Progress Notes (Signed)
FMTS Interim Progress Note  S: Patient seen in bed after surgery.  She reports back pain with a severity of 9/10 otherwise denies any complaints.  O: BP 134/67 (BP Location: Right Arm)   Pulse 94   Temp 98.2 F (36.8 C) (Oral)   Resp 20   Ht 5\' 2"  (1.575 m)   Wt 85.3 kg   SpO2 98%   BMI 34.40 kg/m    General: Pleasant, well-appearing elderly female  in bed. No acute distress. Pulmonary: Normal effort.  Psych: Normal mood and affect   A/P: Continue plan of care per day team's management  Lance Muss, MD 02/19/2023, 8:55 PM PGY-1, [facility] Family Medicine Service pager [phone]

## 2023-02-19 NOTE — Progress Notes (Signed)
As I was receiving report from Primary RN Kathrynn Humble, RN the patients CRNA called Dr. Jairo Ben to report concern that patient was repeating her name "mama C" but was able to answer  direct questions appropriately. She has no problem answering short direct questions but does have difficulty expressing her own thoughts. She follows all commands well.  Dr. Jean Rosenthal is aware of this and she stated that  the patient was "doing well." No new orders received. Pt is sitting up, drinking Sprite and is in no distress.

## 2023-02-19 NOTE — Anesthesia Preprocedure Evaluation (Addendum)
Anesthesia Evaluation  Patient identified by MRN, date of birth, ID band Patient awake    Reviewed: Allergy & Precautions, NPO status , Patient's Chart, lab work & pertinent test results, reviewed documented beta blocker date and time   History of Anesthesia Complications (+) PONV  Airway Mallampati: II  TM Distance: >3 FB Neck ROM: Full    Dental  (+) Dental Advisory Given   Pulmonary sleep apnea (BiPAP, no longer on O2) , COPD,  COPD inhaler   breath sounds clear to auscultation       Cardiovascular hypertension, Pt. on medications and Pt. on home beta blockers pulmonary hypertension+ CAD (non-obstructive), + Peripheral Vascular Disease and + DOE   Rhythm:Regular Rate:Normal  01/2023 ECHO: diffuse global hypokinesis, EF 40-45%, trivial MR, mild TR, severe pulm HTN   Neuro/Psych  Headaches  Anxiety Depression    Chronic back pain: narcotic, spinal cord stim    GI/Hepatic hiatal hernia,GERD  Medicated and Controlled,,  Endo/Other  diabetes (glu 162), Insulin Dependent  BMI 34  Renal/GU Renal InsufficiencyRenal disease     Musculoskeletal   Abdominal  (+) + obese  Peds  Hematology  (+) Blood dyscrasia (Hb 10.3, plt 110k), anemia   Anesthesia Other Findings   Reproductive/Obstetrics                             Anesthesia Physical Anesthesia Plan  ASA: 4  Anesthesia Plan: General   Post-op Pain Management: Tylenol PO (pre-op)*   Induction: Intravenous  PONV Risk Score and Plan: 4 or greater and Ondansetron and Dexamethasone  Airway Management Planned: Oral ETT  Additional Equipment: None  Intra-op Plan:   Post-operative Plan: Extubation in OR  Informed Consent: I have reviewed the patients History and Physical, chart, labs and discussed the procedure including the risks, benefits and alternatives for the proposed anesthesia with the patient or authorized representative who has  indicated his/her understanding and acceptance.     Dental advisory given  Plan Discussed with: CRNA and Surgeon  Anesthesia Plan Comments:         Anesthesia Quick Evaluation

## 2023-02-19 NOTE — Anesthesia Procedure Notes (Signed)
Procedure Name: Intubation Date/Time: 02/19/2023 2:29 PM  Performed by: Alvera Novel, CRNAPre-anesthesia Checklist: Patient identified, Emergency Drugs available, Suction available and Patient being monitored Patient Re-evaluated:Patient Re-evaluated prior to induction Oxygen Delivery Method: Circle System Utilized Preoxygenation: Pre-oxygenation with 100% oxygen Induction Type: IV induction Ventilation: Mask ventilation without difficulty Laryngoscope Size: Miller and 2 Grade View: Grade I Tube type: Oral Tube size: 7.0 mm Number of attempts: 1 Airway Equipment and Method: Stylet and Oral airway Placement Confirmation: ETT inserted through vocal cords under direct vision, positive ETCO2 and breath sounds checked- equal and bilateral Secured at: 21 cm Tube secured with: Tape Dental Injury: Teeth and Oropharynx as per pre-operative assessment

## 2023-02-19 NOTE — Transfer of Care (Signed)
Immediate Anesthesia Transfer of Care Note  Patient: LUCILE COCH  Procedure(s) Performed: THORACIC TEN-LUMBAR TWO INSTRUMENTED FUSION (Spine Thoracic) LUMBAR SPINAL CORD STIMULATOR REMOVAL (Back)  Patient Location: PACU  Anesthesia Type:General  Level of Consciousness: drowsy and patient cooperative  Airway & Oxygen Therapy: Patient Spontanous Breathing and Patient connected to nasal cannula oxygen  Post-op Assessment: Report given to RN, Post -op Vital signs reviewed and stable, and Patient moving all extremities X 4  Post vital signs: Reviewed and stable  Last Vitals:  Vitals Value Taken Time  BP 134/84 02/19/23 1855  Temp    Pulse 103 02/19/23 1857  Resp 17 02/19/23 1857  SpO2 98 % 02/19/23 1857  Vitals shown include unvalidated device data.  Last Pain:  Vitals:   02/19/23 1204  TempSrc:   PainSc: 6       Patients Stated Pain Goal: 4 (02/18/23 0800)  Complications: No notable events documented.

## 2023-02-19 NOTE — Progress Notes (Signed)
Patient ID: Katelyn [name], female   DOB: [birth year], 67 y.o.   MRN: [id number] I spoke with her primary team about her creatinine which has climbed from 1.43-1 0.53-2.03 over the last 3 days.  They spoke about this on rounds and they are comfortable with moving forward with surgery.

## 2023-02-19 NOTE — Progress Notes (Signed)
Patient refused CPAP hs tonight. Will call if needed.

## 2023-02-19 NOTE — Progress Notes (Signed)
[facility]  Date of Admission:  02/13/2023      Total days of antibiotics 0           ASSESSMENT: Katelyn [name] is a 67 y.o. female admitted with acute on chronic worsening of back pain due to thoracic osteomyelitis in the setting of what seems to be infected nerve stimulator with progressive vertebral collapse of thoracic vertebra that will now require surgical stabilization. In anticipation for surgery, we have held antibiotics (nearly 1 week).   Today the plan is for T10-L2 fusion and she hopes removal of stimulator with Dr. Yetta Barre. Surgical cultures will be obtained as well in hopes to identify an organism for her. She has been off antibiotics for nearly a week now so hopeful open sampling will provide a result for [place].   After her cultures are collected - would start treatment with IV daptomycin and Cefepime until cultures mature. Can place PICC line tomorrow if things going well post op.    PLAN: Follow OR findings Follow micro  After surgery concludes - start daptomycin + cefepime    Principal Problem:   Diskitis Active Problems:   Discitis   Malfunction of spinal cord stimulator (HCC)   Serratia   Diabetes mellitus without complication (HCC)    acetaminophen  1,000 mg Oral On Call to OR   [facility] Hold] allopurinol  100 mg Oral QHS   [MAR Hold] carvedilol  6.25 mg Oral BID   [MAR Hold] DULoxetine  60 mg Oral QHS   gabapentin  300 mg Oral On Call to OR   [facility] Hold] HYDROcodone-acetaminophen  2 tablet Oral Q4H   [MAR Hold] insulin aspart  0-9 Units Subcutaneous TID WC   [MAR Hold] lidocaine  2 patch Transdermal Daily   [MAR Hold] mirtazapine  30 mg Oral QHS   [MAR Hold] pantoprazole  40 mg Oral Daily   [MAR Hold] polyethylene glycol  17 g Oral Daily   [MAR Hold] pramipexole  1 mg Oral QHS   [MAR Hold] pregabalin  75 mg Oral QHS   [MAR Hold] senna  2 tablet Oral Daily    SUBJECTIVE: Doing OK.  going to surgery today.  Hopeful to  get some answers to the bacteria after cultures are collected.    Review of Systems: Review of Systems  Constitutional:  Negative for chills and fever.  Musculoskeletal:  Positive for back pain.    Allergies  Allergen Reactions   Atorvastatin Nausea And Vomiting and Other (See Comments)    MYALGIAS   Talwin [Pentazocine] Other (See Comments)    headache   Gabapentin Swelling   Other Nausea Only    UNSPECIFIED Anesthesia    OBJECTIVE: Vitals:   02/18/23 2329 02/19/23 0338 02/19/23 0824 02/19/23 1204  BP: (!) 131/56 (!) 117/55 (!) 131/59 124/70  Pulse: 76 74 83 86  Resp: 18 16 16 18   Temp: 97.6 F (36.4 C) 98.2 F (36.8 C) 99.5 F (37.5 C) 98.3 F (36.8 C)  TempSrc: Oral Oral Oral   SpO2: 100% 91% 97% 90%  Weight:    85.3 kg  Height:    5\' 2"  (1.575 m)   Body mass index is 34.4 kg/m.  Physical Exam Cardiovascular:     Rate and Rhythm: Normal rate and regular rhythm.  Pulmonary:     Effort: Pulmonary effort is normal.     Breath sounds: Normal breath sounds.  Abdominal:  General: Bowel sounds are normal. There is no distension.     Palpations: Abdomen is soft.  Skin:    General: Skin is warm and dry.  Neurological:     Mental Status: She is alert.     Lab Results Lab Results  Component Value Date   WBC 6.3 02/19/2023   HGB 10.3 (L) 02/19/2023   HCT 31.1 (L) 02/19/2023   MCV 90.4 02/19/2023   PLT 110 (L) 02/19/2023    Lab Results  Component Value Date   CREATININE 2.03 (H) 02/19/2023   BUN 53 (H) 02/19/2023   NA 130 (L) 02/19/2023   K 4.9 02/19/2023   CL 94 (L) 02/19/2023   CO2 26 02/19/2023    Lab Results  Component Value Date   ALT 25 12/06/2022   AST 34 12/06/2022   ALKPHOS 153 (H) 12/06/2022   BILITOT 0.6 12/06/2022     Microbiology: Recent Results (from the past 240 hour(s))  Blood culture (routine x 2)     Status: None   Collection Time: 02/13/23  7:44 PM   Specimen: BLOOD  Result Value Ref Range Status   Specimen Description  BLOOD SITE NOT SPECIFIED  Final   Special Requests   Final    BOTTLES DRAWN AEROBIC AND ANAEROBIC Blood Culture results may not be optimal due to an excessive volume of blood received in culture bottles   Culture   Final    NO GROWTH 5 DAYS Performed at [facility], [address] [place], [place] [address]    Report Status 02/18/2023 FINAL  Final  Blood culture (routine x 2)     Status: None   Collection Time: 02/13/23  7:54 PM   Specimen: BLOOD  Result Value Ref Range Status   Specimen Description BLOOD SITE NOT SPECIFIED  Final   Special Requests   Final    BOTTLES DRAWN AEROBIC AND ANAEROBIC Blood Culture results may not be optimal due to an excessive volume of blood received in culture bottles   Culture   Final    NO GROWTH 5 DAYS Performed at [facility], [address] [place], [place] [address]    Report Status 02/18/2023 FINAL  Final  Aerobic/Anaerobic Culture w Gram Stain (surgical/deep wound)     Status: None (Preliminary result)   Collection Time: 02/14/23  1:00 PM   Specimen: Wound; Fine Needle Aspirate  Result Value Ref Range Status   Specimen Description WOUND  Final   Special Requests DISC SPACE T11 T12  Final   Gram Stain NO WBC SEEN NO ORGANISMS SEEN   Final   Culture   Final    NO GROWTH 4 DAYS NO ANAEROBES ISOLATED; CULTURE IN PROGRESS FOR 5 DAYS Performed at [facility], [address], [place], [place] [address]    Report Status PENDING  Incomplete  Fungus Culture With Stain     Status: None (Preliminary result)   Collection Time: 02/14/23  1:15 PM   Specimen: Intervertebral Disc  Result Value Ref Range Status   Fungus Stain Final report  Final    Comment: (NOTE) Performed At: [facility] [address] [place], [place] [id number] Jolene Schimke MD OZ:[id number]    Fungus (Mycology) Culture PENDING  Incomplete   Fungal Source WOUND  Final    Comment: DISC SPACE T11,T12 Performed at [facility], [address] [place], [place] [address]   Fungus Culture Result     Status: None  Collection Time: 02/14/23  1:15 PM  Result Value Ref Range Status   Result 1 Comment  Final    Comment: (NOTE) KOH/Calcofluor preparation:  no fungus observed. Performed At: [facility] [address] [place], [place] [id number] Jolene Schimke MD WG:[id number]   Surgical pcr screen     Status: None   Collection Time: 02/19/23  8:35 AM   Specimen: Nasal Mucosa; Nasal Swab  Result Value Ref Range Status   MRSA, PCR NEGATIVE NEGATIVE Final   Staphylococcus aureus NEGATIVE NEGATIVE Final    Comment: (NOTE) The Xpert SA Assay (FDA approved for NASAL specimens in patients 76 years of age and older), is one component of a comprehensive surveillance program. It is not intended to diagnose infection nor to guide or monitor treatment. Performed at [facility], [address], [place], [place] [address]      Rexene Alberts, MSN, NP-C [facility]  [email or web address] Pager: [phone] Office: [phone] [facility] Main Line: [phone] *Secure Chat Communication Welcome   Total Encounter - 12 minutes

## 2023-02-19 NOTE — TOC Progression Note (Signed)
Transition of Care [facility]) - Progression Note    Patient Details  Name: Katelyn [name] MRN: [id number] Date of Birth: 1956-07-16  [facility]) CM/SW Contact  Kermit Balo, RN Phone Number: 02/19/2023, 11:54 AM  Clinical Narrative:    Plan is for OR today.  TOC following for d/c needs.    Expected Discharge Plan: Home w Home Health Services Barriers to Discharge: Continued Medical Work up  Expected Discharge Plan and Services   Discharge Planning Services: CM Consult   Living arrangements for the past 2 months: Single Family Home                                       Social Determinants of Health (SDOH) Interventions SDOH Screenings   Food Insecurity: No Food Insecurity (02/13/2023)  Housing: Low Risk  (02/13/2023)  Transportation Needs: No Transportation Needs (02/13/2023)  Utilities: Not At Risk (02/13/2023)  Depression (PHQ2-9): Low Risk  (02/08/2023)  Tobacco Use: Low Risk  (02/14/2023)    Readmission Risk Interventions     No data to display

## 2023-02-19 NOTE — Op Note (Signed)
02/13/2023 - 02/19/2023  6:38 PM  PATIENT:  Katelyn [name]  67 y.o. female  PRE-OPERATIVE DIAGNOSIS: Sequelae of T11-12 discitis with destruction of the T11-12 endplate and partial destruction of the vertebral bodies with thoracic instability and thoracic stenosis  POST-OPERATIVE DIAGNOSIS:  same  PROCEDURE:   1. Decompressive thoracic laminectomy, medial facetectomy T11-12 3. Posterior fixation T10 and L2 inclusive using Alphatec  pedicle screws.  4. Intertransverse arthrodesis T10 to L2 inclusive using morcellized autograft and allograft. 4.  Removal of entire spinal cord stimulator system  SURGEON:  Marikay Alar, MD  ASSISTANTS: Verlin Dike, FNP  ANESTHESIA:  General  EBL: 150 ml  Total I/O In: 2100 [I.V.:1600; IV Piggyback:500] Out: 1050 [Urine:900; Blood:150]  BLOOD ADMINISTERED:none  DRAINS: Medium Hemovac  INDICATION FOR PROCEDURE: This patient presented with back pain and some leg pain. Imaging revealed sequelae of discitis at T11-12 with destruction of the endplates and the vertebral bodies flattening of her lordosis some stenosis at T11-12. The patient tried a reasonable attempt at conservative medical measures without relief. I recommended decompression and instrumented fusion to address the stenosis as well as the segmental  instability.  Patient understood the risks, benefits, and alternatives and potential outcomes and wished to proceed.  PROCEDURE DETAILS:  The patient was brought to the operating room. After induction of generalized endotracheal anesthesia the patient was rolled into the prone position on chest rolls and all pressure points were padded. The patient's thoracic and lumbar region was cleaned and then prepped with DuraPrep and draped in the usual sterile fashion.  Local anesthesia was injected over the battery site of the spinal cord stimulator.  An incision was made through the old incision and the battery was identified.  We also started our incision in  the thoracic region and identified the coiled leads.  We did not remove the paddle but we did cut the leads in the thoracic region and then pulled the leads down to the battery pocket and removed the battery and the leads together.  I then moved the capsule that was around the battery from the pocket and then closed the pocket with 2-0 Vicryl in the subcutaneous tissues and 3-0 Vicryl in the subcuticular tissues.  Then turned our attention to the exposure for the thoracic part of the procedure.  Anesthesia was injected and then a dorsal midline incision was made and carried down to the thoracic and lumbar fascia. The fascia was opened and the paraspinous musculature was taken down in a subperiosteal fashion to expose T10-L3 as well as the previously placed instrumentation at L2-3.  Remove the locking caps from the previous pedicle screws and remove the rods.  The screws had good purchase.  We removed the top 2 screws which were 5.5 x 40 mm screws, and replace them with 6.5 x 40 mm pedicle screws.. A self-retaining retractor was placed. Intraoperative fluoroscopy confirmed my level, and I started with placement of the T10-L1 pedicle screws.  Started at L1 and worked our way up to T10.  The pedicle screw entry zones were identified utilizing surface landmarks and  AP and lateral fluoroscopy. I scored the cortex with the high-speed drill and then used the thoracic pedicle probe to probe each pedicle, palpated with a ball probe, tapped each pedicle with a 4.5 tap, probed with a ball probe once again, and then placed 5.5 x 45 mm pedicle screws at T10, T11, and L1.  We used shorter screws at T12 because of the destruction of the endplate.  Then checked our placement with AP and lateral fluoroscopy.   I then turned my attention to the decompression at T11-12 and we localized our level with lateral fluoroscopy but also by counting the pedicle screws.  The spinous process of T11 was removed and we used the high-speed  drill to perform a laminectomy and medial facetectomy at T11-12.  The drill shavings were saved in the mucus trap for later arthrodesis.  The cut the lateral recess and decompress the cord very gently.  At no time did I find any pus or anything to culture.  My nurse practitioner was directly involved in the decompression and exposure of the neural elements.   We then decorticated the transverse processes and rib heads of T10, T11, T12 and L1 and L2 bilaterally and laid a mixture of morcellized autograft and allograft out over these to perform intertransverse arthrodesis at T10 and L2 bilaterally. We then placed  fairly straight rods into the multiaxial screw heads of the pedicle screws T10 and L2 and locked these in position with the locking caps and anti-torque device. We then checked our construct with AP and lateral fluoroscopy. Irrigated with copious amounts of bacitracin-containing saline solution.  Placed a medium Hemovac drain through a separate stab incision, lined to the dura with Surgifoam and Gelfoam,  and then we closed the muscle and the fascia with 0 Vicryl. Closed the subcutaneous tissues with 2-0 Vicryl and subcuticular tissues with 3-0 Vicryl. The skin was closed with benzoin and Steri-Strips. Dressing was then applied, the patient was awakened from general anesthesia and transported to the recovery room in stable condition. At the end of the procedure all sponge, needle and instrument counts were correct.   PLAN OF CARE: admit to inpatient  PATIENT DISPOSITION:  PACU - hemodynamically stable.   Delay start of Pharmacological VTE agent (>24hrs) due to surgical blood loss or risk of bleeding:  yes

## 2023-02-19 NOTE — Progress Notes (Signed)
     Daily Progress Note Intern Pager: [phone]  Patient name: Katelyn [name] Medical record number: [id number] Date of birth: 1956/06/29 Age: 67 y.o. Gender: female  Primary Care Provider: Olive Bass, MD Consultants: ID, neurosurgery  Code Status: Full code   Pt Overview and Major Events to Date:  5/7: Admitted to FMTS 5/8: IR aspiration of thoracic spine 5/13: NSGY stabilization of T11/12   Assessment and Plan:  Katelyn [name] is a 67 y.o. 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 Pertinent PMH/PSH includes chronic low back pain with multiple lumbar surgeries, T2DM, obesity, OSA, LBBB, HTN, HLD, CAD, CKD stage IIIb, anxiety.   * Diskitis NSGY intervention this afternoon for stabilization of T11/12 region. Aspiration culture no growth @ 4 days, likely inadequate sample. Will pend abx until deep surgical culture obtained. Continues to have episodes of sharp pain, not sleeping well due to pain. Will continue to monitor closely and spot dose Oxy as needed. -NSGY following, appreciate recs -ID following, appreciate recs -Abx pending deep surgical culture -Pain management: continue Norco 10-650 q4h Sch, Lidocaine patch, Voltaren gel prn -Continue bowel regimen with Miralax and Senna 2 tabs daily -PT/OT  Diabetes mellitus without complication (HCC) BGL consistently in 100s. Only required 5U SSI yesterday. -sSSI with CBG checks   FEN/GI: NPO PPx: Held for surgical intervention Dispo: Home pending NSGY intervention  Subjective:  Patient assessed at bedside with husband present. Patient sleepy, states she did not sleep well all night due to pain. States her legs are cramping and she had an episode of shooting pain during exam. Ready for NSGY today and reiterated she wants to have the nerve stimulator taken out.  Objective: Temp:  [97.6 F (36.4 C)-99.5 F (37.5 C)] 99.5 F (37.5 C) (05/13 0824) Pulse Rate:  [66-83] 83 (05/13 0824) Resp:  [16-18] 16  (05/13 0824) BP: (99-131)/(53-60) 131/59 (05/13 0824) SpO2:  [90 %-100 %] 97 % (05/13 0824) Physical Exam: General: Laying down in bed, intermittent shooting pain. Sleepy during exam. Cardiovascular: RRR without murmur Respiratory: CTAB. Normal WOB on RA Abdomen: Soft, non-tender, non-distended Extremities: No peripheral edema.  Laboratory: Most recent CBC Lab Results  Component Value Date   WBC 6.3 02/19/2023   HGB 10.3 (L) 02/19/2023   HCT 31.1 (L) 02/19/2023   MCV 90.4 02/19/2023   PLT 110 (L) 02/19/2023   Most recent BMP    Latest Ref Rng & Units 02/19/2023    4:12 AM  BMP  Glucose 70 - 99 mg/dL 130   BUN 8 - 23 mg/dL 53   Creatinine 8.65 - 1.00 mg/dL 7.84   Sodium 696 - 295 mmol/L 130   Potassium 3.5 - 5.1 mmol/L 4.9   Chloride 98 - 111 mmol/L 94   CO2 22 - 32 mmol/L 26   Calcium 8.9 - 10.3 mg/dL 9.3     Other pertinent labs: Glu: 162   [name], MD 02/19/2023, 9:30 AM  PGY-1, [facility] FPTS Intern pager: [phone], text pages welcome Secure chat group [facility] Teaching Service

## 2023-02-19 NOTE — H&P (Signed)
Subjective: Patient is a 67 y.o. female admitted for sequalae of T11-12 diskitis. Onset of symptoms was a few months ago, gradually worsening since that time.  The pain is rated severe, and is located at the across the lower back. The pain is described as aching and occurs intermittently. The symptoms have been progressive. Symptoms are exacerbated by exercise and standing. MRI or CT showed destruction of the endplates and disk space of T11-12   Past Medical History:  Diagnosis Date   Anxiety    Arthritis    Bursitis of right hip    CAD (coronary artery disease)    Cardiac catheterization June 2014 in [place] - 50% circumflex stenosis   Chest pain, neg MI, stable CAD non obstructive on cath 10/05/20 10/04/2020   Chronic diastolic heart failure (HCC) 08/20/2017   Chronic kidney disease, stage 3 (HCC)    does not see nephrologist   Chronic low back pain without sciatica 03/14/2016   CKD (chronic kidney disease), stage III (HCC) 10/07/2020   Complication of anesthesia    Cough 04/19/2017   Overview:  Last Assessment & Plan:  Formatting of this note may be different from the original. Cough - ? ACE related with AR triggers   Plan  Patient Instructions  Discuss with your primary doctor that lisinopril pain, need making your cough worse. May use Mucinex DM twice daily as needed for cough and congestion Zyrtec 10 mg at bedtime as needed for drainage Saline nasal spray as needed. Lab tests today Activity as tolerated. Follow with Dr. Craige Cotta in 3-4 months and As needed   Please contact office for sooner follow up if symptoms do not improve or worsen or seek emergency care    Depression    Dyspnea    with exertion   " lazy lung" - per  Dr Craige Cotta from back issues- 06/2016   Elevated liver enzymes 12/05/2016   Essential hypertension    GERD (gastroesophageal reflux disease)    Gout 03/14/2016   Greater trochanteric bursitis of right hip 02/02/2012   H/O hiatal hernia    Heart murmur    History of blood  transfusion 2016   History of esophageal stricture 10/07/2020   History of kidney stones    Hypercholesterolemia    Hypertensive heart disease with heart failure (HCC) 01/01/2017   Hypoxia 10/07/2020   Iliotibial band syndrome of right side 02/02/2012   Iron deficiency anemia due to chronic blood loss 03/14/2016   LBBB (left bundle branch block) 01/01/2017   Left bundle branch block    Leg weakness 10/07/2020   Lumbar stenosis    Meralgia paraesthetica 12/05/2016   Mild CAD 11/24/2015   Morbid obesity (HCC) 10/07/2020   Neuropathy    OSA (obstructive sleep apnea) 05/24/2016   Overview:  Managed PULM   PONV (postoperative nausea and vomiting)    "no N/V with patch"   Restless leg syndrome    S/P lumbar laminectomy 11/26/2015   S/P lumbar spinal fusion 08/29/2016   Tinnitus 12/05/2016   Type 2 diabetes mellitus (HCC)    UTI (urinary tract infection) 10/07/2020    Past Surgical History:  Procedure Laterality Date   ABDOMINAL HYSTERECTOMY  1983   APPENDECTOMY  Age 48   BACK SURGERY     FIRST LUMBAR FUSION/ SURGERY APRIL 2012 AND FUSION WITH INSTRUMENTATION SEPT 2012   CARDIAC CATHETERIZATION     x 2   CARPAL TUNNEL RELEASE     bil   CHOLECYSTECTOMY  1990's  COLONOSCOPY  12/12/2017   Colonic polyp status post polypectomy. Mild sigmoid diverticulosis. Otherwise normal colonoscopy to terminal ileum.   CYSTO EXTRACTION KIDNEY STONES     ESOPHAGOGASTRODUODENOSCOPY  12/12/2017   Small hiatal hernia. Mild gastritis. Status post esophageal dilatation.   EXCISION/RELEASE BURSA HIP  02/02/2012   Procedure: EXCISION/RELEASE BURSA HIP;  Surgeon: Jacki Cones, MD;  Location: [facility] ORS;  Service: Orthopedics;  Laterality: Right;  Right Hip Bursectomy   EYE SURGERY Bilateral    cataracts   IR THORACIC DISC ASPIRATION W/IMG GUIDE  02/14/2023   KIDNEY STONE SURGERY  2008   Left knee surgery x 2  1996   reconstruction   LUMBAR DISC SURGERY  08/2016   LUMBAR LAMINECTOMY/DECOMPRESSION  MICRODISCECTOMY Right 11/26/2015   Procedure: Extraforaminal Microdiscectomy  - Lumbar two-three- right;  Surgeon: Tia Alert, MD;  Location: [facility];  Service: Neurosurgery;  Laterality: Right;  right    LUMBAR WOUND DEBRIDEMENT N/A 10/25/2016   Procedure: Lumbar wound revision;  Surgeon: Tia Alert, MD;  Location: [facility];  Service: Neurosurgery;  Laterality: N/A;  Lumbar wound revision   Right shoulder surgery  2010   spur   RIGHT/LEFT HEART CATH AND CORONARY ANGIOGRAPHY N/A 10/05/2020   Procedure: RIGHT/LEFT HEART CATH AND CORONARY ANGIOGRAPHY;  Surgeon: [place], Peter M, MD;  Location: [facility] INVASIVE CV LAB;  Service: Cardiovascular;  Laterality: N/A;    Prior to Admission medications   Medication Sig Start Date End Date Taking? Authorizing Provider  allopurinol (ZYLOPRIM) 100 MG tablet Take 100 mg by mouth at bedtime. 10/20/19  Yes [provider]  aspirin EC 81 MG tablet Take 81 mg by mouth daily. **presently on hold for surgery**   Yes [provider]  carvedilol (COREG) 6.25 MG tablet Take 1 tablet (6.25 mg total) by mouth 2 (two) times daily. Take 6.25 mg by mouth 2 (two) times daily. / Needs appointment for further refills 04/27/21  Yes Baldo Daub, MD  cyclobenzaprine (FLEXERIL) 5 MG tablet Take 5 mg by mouth 3 (three) times daily as needed for muscle spasms. 06/01/22  Yes [provider]  DULoxetine (CYMBALTA) 60 MG capsule Take 60 mg by mouth at bedtime.    Yes [provider]  HYDROcodone-acetaminophen (NORCO/VICODIN) 5-325 MG tablet Take 1 tablet by mouth every 6 (six) hours as needed for moderate pain. 12/11/22 12/11/23 Yes [place], Eric J, DO  mirtazapine (REMERON) 15 MG tablet Take 30 mg by mouth at bedtime.   Yes [provider]  Multiple Vitamins-Minerals (HAIR SKIN AND NAILS FORMULA) TABS Take 1 tablet by mouth daily. 10/17/18  Yes [provider]  nitroGLYCERIN (NITROSTAT) 0.4 MG SL tablet Place 0.4 mg under the tongue  every 5 (five) minutes as needed for chest pain.    Yes [provider]  NOVOLOG FLEXPEN 100 UNIT/ML FlexPen Inject 8 Units into the skin 3 (three) times daily with meals as needed for high blood sugar. 11/15/21  Yes [provider]  pantoprazole (PROTONIX) 40 MG tablet Take 1 tablet by mouth once daily 07/31/22  Yes Munley, Iline Oven, MD  pramipexole (MIRAPEX) 1 MG tablet Take 1 mg by mouth at bedtime.   Yes [provider]  pregabalin (LYRICA) 75 MG capsule Take 75 mg by mouth at bedtime. 09/24/21  Yes [provider]  torsemide (DEMADEX) 20 MG tablet Take 1 tablet (20 mg total) by mouth 2 (two) times daily. 01/08/23  Yes Baldo Daub, MD  TRESIBA FLEXTOUCH 200 UNIT/ML  FlexTouch Pen Inject 110 Units into the skin at bedtime. 11/15/21  Yes [provider]  albuterol (VENTOLIN HFA) 108 (90 Base) MCG/ACT inhaler Inhale 2 puffs into the lungs every 6 (six) hours as needed for wheezing or shortness of breath. Patient not taking: Reported on 02/14/2023 02/05/23   Noemi Chapel, NP  ciprofloxacin (CIPRO) 500 MG tablet Take 1 tablet (500 mg total) by mouth 2 (two) times daily. Patient not taking: Reported on 02/14/2023 02/09/23 05/10/23  Kathlynn Grate, DO  Docusate Sodium (DSS) 100 MG CAPS Take 100 mg by mouth. Patient not taking: Reported on 02/14/2023 01/12/23   [provider]  doxycycline (VIBRA-TABS) 100 MG tablet Take 1 tablet (100 mg total) by mouth 2 (two) times daily. Patient not taking: Reported on 02/14/2023 02/09/23 05/10/23  Kathlynn Grate, DO  FARXIGA 5 MG TABS tablet Take 5 mg by mouth daily. Patient not taking: Reported on 02/14/2023 01/20/23   [provider]   Allergies  Allergen Reactions   Atorvastatin Nausea And Vomiting and Other (See Comments)    MYALGIAS   Talwin [Pentazocine] Other (See Comments)    headache   Gabapentin Swelling   Other Nausea Only    UNSPECIFIED Anesthesia    Social History   Tobacco Use   Smoking  status: Never   Smokeless tobacco: Never   Tobacco comments:    Prior secondhand smoke  Substance Use Topics   Alcohol use: Yes    Alcohol/week: 0.0 standard drinks of alcohol    Comment: Rarely    Family History  Problem Relation Age of Onset   Lung cancer Father        smoked   Hypertension Father    Stroke Father    Heart failure Mother    Bone cancer Sister    Asthma Sister      Review of Systems  Positive ROS: neg  All other systems have been reviewed and were otherwise negative with the exception of those mentioned in the HPI and as above.  Objective: Vital signs in last 24 hours: Temp:  [97.6 F (36.4 C)-99.5 F (37.5 C)] 98.3 F (36.8 C) (05/13 1204) Pulse Rate:  [66-86] 86 (05/13 1204) Resp:  [16-18] 18 (05/13 1204) BP: (99-131)/(53-70) 124/70 (05/13 1204) SpO2:  [90 %-100 %] 90 % (05/13 1204) Weight:  [85.3 kg] 85.3 kg (05/13 1204)  General Appearance: Alert, cooperative, no distress, appears stated age Head: Normocephalic, without obvious abnormality, atraumatic Eyes: PERRL, conjunctiva/corneas clear, EOM's intact    Neck: Supple, symmetrical, trachea midline Back: Symmetric, no curvature, ROM normal, no CVA tenderness Lungs:  respirations unlabored Heart: Regular rate and rhythm Abdomen: Soft, non-tender Extremities: Extremities normal, atraumatic, no cyanosis or edema Pulses: 2+ and symmetric all extremities Skin: Skin color, texture, turgor normal, no rashes or lesions  NEUROLOGIC:   Mental status: Alert and oriented x4,  no aphasia, good attention span, fund of knowledge, and memory Motor Exam - grossly normal Sensory Exam - grossly normal Reflexes: 1+ Coordination - grossly normal Gait - grossly normal Balance - grossly normal Cranial Nerves: I: smell Not tested  II: visual acuity  OS: nl    OD: nl  II: visual fields Full to confrontation  II: pupils Equal, round, reactive to light  III,VII: ptosis None  III,IV,VI: extraocular muscles   Full ROM  V: mastication Normal  V: facial light touch sensation  Normal  V,VII: corneal reflex  Present  VII: facial muscle function - upper  Normal  VII: facial muscle function - lower Normal  VIII: hearing Not tested  IX: soft palate elevation  Normal  IX,X: gag reflex Present  XI: trapezius strength  5/5  XI: sternocleidomastoid strength 5/5  XI: neck flexion strength  5/5  XII: tongue strength  Normal    Data Review Lab Results  Component Value Date   WBC 6.3 02/19/2023   HGB 10.3 (L) 02/19/2023   HCT 31.1 (L) 02/19/2023   MCV 90.4 02/19/2023   PLT 110 (L) 02/19/2023   Lab Results  Component Value Date   NA 130 (L) 02/19/2023   K 4.9 02/19/2023   CL 94 (L) 02/19/2023   CO2 26 02/19/2023   BUN 53 (H) 02/19/2023   CREATININE 2.03 (H) 02/19/2023   GLUCOSE 170 (H) 02/19/2023   Lab Results  Component Value Date   INR 1.1 02/19/2023    Assessment/Plan:  Estimated body mass index is 34.4 kg/m as calculated from the following:   Height as of this encounter: 5\' 2"  (1.575 m).   Weight as of this encounter: 85.3 kg. Patient admitted for thoracolumbar stabilization T10-L3, possible laminectomy T11-12, removal of SCS. Patient has failed a reasonable attempt at conservative therapy.  I explained the condition and procedure to the patient and answered any questions.  Patient wishes to proceed with procedure as planned. Understands risks/ benefits and typical outcomes of procedure.   Tia Alert 02/19/2023 1:37 PM

## 2023-02-19 NOTE — Anesthesia Postprocedure Evaluation (Signed)
Anesthesia Post Note  Patient: Katelyn [name]  Procedure(s) Performed: THORACIC TEN-LUMBAR TWO INSTRUMENTED FUSION (Spine Thoracic) LUMBAR SPINAL CORD STIMULATOR REMOVAL (Back)     Patient location during evaluation: PACU Anesthesia Type: General Level of consciousness: awake and alert, oriented and patient cooperative Pain management: pain level controlled Vital Signs Assessment: post-procedure vital signs reviewed and stable Respiratory status: spontaneous breathing, nonlabored ventilation, respiratory function stable and patient connected to nasal cannula oxygen Cardiovascular status: blood pressure returned to baseline and stable Postop Assessment: no apparent nausea or vomiting Anesthetic complications: no   No notable events documented.  Last Vitals:  Vitals:   02/19/23 1905 02/19/23 1915  BP:  (!) 152/74  Pulse: (!) 102 96  Resp: 19 12  Temp:    SpO2: 98% 100%    Last Pain:  Vitals:   02/19/23 1915  TempSrc:   PainSc: Asleep                 Kaiyu Mirabal,E. Shakeria Robinette

## 2023-02-20 ENCOUNTER — Encounter (HOSPITAL_COMMUNITY): Payer: Self-pay | Admitting: Neurological Surgery

## 2023-02-20 ENCOUNTER — Inpatient Hospital Stay (HOSPITAL_COMMUNITY): Payer: PPO

## 2023-02-20 ENCOUNTER — Other Ambulatory Visit: Payer: Self-pay

## 2023-02-20 DIAGNOSIS — Z981 Arthrodesis status: Secondary | ICD-10-CM

## 2023-02-20 DIAGNOSIS — M4644 Discitis, unspecified, thoracic region: Secondary | ICD-10-CM | POA: Diagnosis not present

## 2023-02-20 DIAGNOSIS — N179 Acute kidney failure, unspecified: Secondary | ICD-10-CM

## 2023-02-20 DIAGNOSIS — T85192D Other mechanical complication of implanted electronic neurostimulator (electrode) of spinal cord, subsequent encounter: Secondary | ICD-10-CM | POA: Diagnosis not present

## 2023-02-20 DIAGNOSIS — A498 Other bacterial infections of unspecified site: Secondary | ICD-10-CM | POA: Diagnosis not present

## 2023-02-20 DIAGNOSIS — E875 Hyperkalemia: Secondary | ICD-10-CM

## 2023-02-20 HISTORY — DX: Acute kidney failure, unspecified: N17.9

## 2023-02-20 LAB — URINALYSIS, ROUTINE W REFLEX MICROSCOPIC
Bilirubin Urine: NEGATIVE
Glucose, UA: NEGATIVE mg/dL
Hgb urine dipstick: NEGATIVE
Ketones, ur: NEGATIVE mg/dL
Nitrite: NEGATIVE
Protein, ur: NEGATIVE mg/dL
Specific Gravity, Urine: 1.023 (ref 1.005–1.030)
WBC, UA: >50 WBC/hpf (ref 0–5)
pH: 5 (ref 5.0–8.0)

## 2023-02-20 LAB — BASIC METABOLIC PANEL
Anion gap: 8 (ref 5–15)
Anion gap: 9 (ref 5–15)
BUN: 57 mg/dL — ABNORMAL HIGH (ref 8–23)
BUN: 64 mg/dL — ABNORMAL HIGH (ref 8–23)
CO2: 24 mmol/L (ref 22–32)
CO2: 23 mmol/L (ref 22–32)
Calcium: 8.5 mg/dL — ABNORMAL LOW (ref 8.9–10.3)
Calcium: 8.5 mg/dL — ABNORMAL LOW (ref 8.9–10.3)
Chloride: 98 mmol/L (ref 98–111)
Chloride: 95 mmol/L — ABNORMAL LOW (ref 98–111)
Creatinine, Ser: 2.18 mg/dL — ABNORMAL HIGH (ref 0.44–1.00)
Creatinine, Ser: 2.26 mg/dL — ABNORMAL HIGH (ref 0.44–1.00)
GFR, Estimated: 25 mL/min — ABNORMAL LOW (ref >60)
GFR, Estimated: 24 mL/min — ABNORMAL LOW (ref >60)
Glucose, Bld: 263 mg/dL — ABNORMAL HIGH (ref 70–99)
Glucose, Bld: 286 mg/dL — ABNORMAL HIGH (ref 70–99)
Potassium: 5.9 mmol/L — ABNORMAL HIGH (ref 3.5–5.1)
Potassium: 4.8 mmol/L (ref 3.5–5.1)
Sodium: 130 mmol/L — ABNORMAL LOW (ref 135–145)
Sodium: 127 mmol/L — ABNORMAL LOW (ref 135–145)

## 2023-02-20 LAB — GLUCOSE, CAPILLARY
Glucose-Capillary: 272 mg/dL — ABNORMAL HIGH (ref 70–99)
Glucose-Capillary: 190 mg/dL — ABNORMAL HIGH (ref 70–99)
Glucose-Capillary: 280 mg/dL — ABNORMAL HIGH (ref 70–99)
Glucose-Capillary: 234 mg/dL — ABNORMAL HIGH (ref 70–99)

## 2023-02-20 LAB — CBC
HCT: 24.2 % — ABNORMAL LOW (ref 36.0–46.0)
Hemoglobin: 8.1 g/dL — ABNORMAL LOW (ref 12.0–15.0)
MCH: 30.6 pg (ref 26.0–34.0)
MCHC: 33.5 g/dL (ref 30.0–36.0)
MCV: 91.3 fL (ref 80.0–100.0)
Platelets: 113 10*3/uL — ABNORMAL LOW (ref 150–400)
RBC: 2.65 MIL/uL — ABNORMAL LOW (ref 3.87–5.11)
RDW: 14.9 % (ref 11.5–15.5)
WBC: 8.7 10*3/uL (ref 4.0–10.5)
nRBC: 0 % (ref 0.0–0.2)

## 2023-02-20 MED ORDER — SODIUM CHLORIDE 0.9 % IV SOLN
10.0000 mg/kg | INTRAVENOUS | Status: DC
  Administered 2023-02-20: 650 mg via INTRAVENOUS
  Filled 2023-02-20 (×2): qty 13

## 2023-02-20 MED ORDER — SODIUM CHLORIDE 0.9 % IV BOLUS
500.0000 mL | Freq: Once | INTRAVENOUS | Status: AC
  Administered 2023-02-20: 500 mL via INTRAVENOUS

## 2023-02-20 MED ORDER — OXYCODONE HCL 5 MG PO TABS
5.0000 mg | ORAL_TABLET | ORAL | Status: DC | PRN
  Administered 2023-02-20 (×3): 5 mg via ORAL
  Filled 2023-02-20 (×4): qty 1

## 2023-02-20 MED ORDER — GUAIFENESIN-DM 100-10 MG/5ML PO SYRP
5.0000 mL | ORAL_SOLUTION | ORAL | Status: DC | PRN
  Administered 2023-02-20: 5 mL via ORAL
  Filled 2023-02-20: qty 5

## 2023-02-20 MED ORDER — SODIUM CHLORIDE 0.9 % IV SOLN
2.0000 g | INTRAVENOUS | Status: DC
  Administered 2023-02-20 – 2023-02-21 (×2): 2 g via INTRAVENOUS
  Filled 2023-02-20 (×2): qty 12.5

## 2023-02-20 MED ORDER — SODIUM ZIRCONIUM CYCLOSILICATE 10 G PO PACK
10.0000 g | PACK | Freq: Once | ORAL | Status: AC
  Administered 2023-02-20: 10 g via ORAL
  Filled 2023-02-20: qty 1

## 2023-02-20 NOTE — Progress Notes (Signed)
Occupational Therapy Re-eval  Patient Details Name: Katelyn [name] MRN: [id number] DOB: 09/11/1956 Today's Date: 02/20/2023   History of present illness Patient is a 67 y/o female admitted 02/13/23 with back pain and radiculopathy.  She was found to have discitis/infection. S/P decompressive laminectomy, medial facetectomy T11-12, posterior fixation T10-L2, and removal of spinal cord stimulator system. PMH positive for anxiety, chronic low back pain with multiple lumbar surgeries (most recent in Dec with spinal stimulator), DM2, obesity, OSA, LBBB, HTN, hypercholesterolemia, CKD, CAD.   OT comments  Pt seen post spinal surgery.  Educated on back precautions, brace mgmt and wear schedule, ADL compensatory techniques and recommendations. Pt requires min assist for bed mobility, min assist +2 for transfers and mobility in room using RW and setup to mod assist for ADLs.  She reports back pain is much improved, but feels deconditioned from prolonged bedrest.  Pt has good support of spouse, and all needed DME at home.  Recommend continued OT services acutely and after dc at [facility] level to optimize independence, safety, and return to PLOF. Will follow.    Recommendations for follow up therapy are one component of a multi-disciplinary discharge planning process, led by the attending physician.  Recommendations may be updated based on patient status, additional functional criteria and insurance authorization.    Assistance Recommended at Discharge Frequent or constant Supervision/Assistance  Patient can return home with the following  A little help with walking and/or transfers;A lot of help with bathing/dressing/bathroom;Assistance with cooking/housework;Assist for transportation;Help with stairs or ramp for entrance   Equipment Recommendations  None recommended by OT    Recommendations for Other Services      Precautions / Restrictions Precautions Precautions: Back Precaution Booklet Issued:  No Precaution Comments: reviewed with pt Required Braces or Orthoses: Spinal Brace Spinal Brace: Thoracolumbosacral orthotic;Applied in sitting position Restrictions Weight Bearing Restrictions: No       Mobility Bed Mobility Overal bed mobility: Needs Assistance Bed Mobility: Rolling, Sidelying to Sit Rolling: Min guard Sidelying to sit: Min assist       General bed mobility comments: min assist to elevate trunk to EOB from sidelying    Transfers Overall transfer level: Needs assistance Equipment used: Rolling walker (2 wheels) Transfers: Sit to/from Stand Sit to Stand: Min assist, +2 safety/equipment, +2 physical assistance           General transfer comment: to power up and steady, cueing for posture and hand placement     Balance Overall balance assessment: Needs assistance Sitting-balance support: No upper extremity supported, Feet supported Sitting balance-Leahy Scale: Fair Sitting balance - Comments: cueing for asfety due to posterior lean at times, able to correct with verbal cues   Standing balance support: Bilateral upper extremity supported, During functional activity, Single extremity supported Standing balance-Leahy Scale: Poor Standing balance comment: relies on UE and external support                           ADL either performed or assessed with clinical judgement   ADL Overall ADL's : Needs assistance/impaired     Grooming: Set up;Sitting           Upper Body Dressing : Minimal assistance;Sitting Upper Body Dressing Details (indicate cue type and reason): brace mgmt Lower Body Dressing: +2 for physical assistance;+2 for safety/equipment;Moderate assistance Lower Body Dressing Details (indicate cue type and reason): assist for socks and relies on UE support in standing Toilet Transfer: Minimal assistance;+2 for safety/equipment;+2 for  physical assistance Toilet Transfer Details (indicate cue type and reason): mobility using RW to  [facility] Toileting- Clothing Manipulation and Hygiene: Moderate assistance;Sit to/from stand Toileting - Clothing Manipulation Details (indicate cue type and reason): hygiene, clohting mgmt and balance     Functional mobility during ADLs: Minimal assistance;+2 for safety/equipment;+2 for physical assistance;Rolling walker (2 wheels)      Extremity/Trunk Assessment Upper Extremity Assessment Upper Extremity Assessment: Overall WFL for tasks assessed   Lower Extremity Assessment Lower Extremity Assessment: Defer to PT evaluation        Vision   Vision Assessment?: No apparent visual deficits   Perception     Praxis      Cognition Arousal/Alertness: Awake/alert Behavior During Therapy: WFL for tasks assessed/performed Overall Cognitive Status: Within Functional Limits for tasks assessed                                          Exercises      Shoulder Instructions       General Comments spouse at side and supportive, educated on recommendations for up to chair for meals and limiting sitting tolerance to 1 hour max today    Pertinent Vitals/ Pain       Pain Assessment Pain Assessment: 0-10 Pain Score: 5  Pain Location: incisional back Pain Descriptors / Indicators: Discomfort, Operative site guarding Pain Intervention(s): Limited activity within patient's tolerance, Monitored during session, Repositioned  Home Living Family/patient expects to be discharged to:: Private residence Living Arrangements: Spouse/significant other Available Help at Discharge: Family Type of Home: House Home Access: Ramped entrance     Home Layout: One level     Bathroom Shower/Tub: Arts development officer Toilet: Handicapped height     Home Equipment: Agricultural consultant (2 wheels);Rollator (4 wheels);Wheelchair - manual;Shower seat;Grab bars - tub/shower;BSC/3in1;Hand held shower head          Prior Functioning/Environment              Frequency  Min 2X/week         Progress Toward Goals  OT Goals(current goals can now be found in the care plan section)     Acute Rehab OT Goals Patient Stated Goal: get better OT Goal Formulation: With patient Time For Goal Achievement: 03/06/23 Potential to Achieve Goals: Good  Plan      Co-evaluation                 AM-PAC OT "6 Clicks" Daily Activity     Outcome Measure   Help from another person eating meals?: None Help from another person taking care of personal grooming?: A Little Help from another person toileting, which includes using toliet, bedpan, or urinal?: A Lot Help from another person bathing (including washing, rinsing, drying)?: A Lot Help from another person to put on and taking off regular upper body clothing?: A Little Help from another person to put on and taking off regular lower body clothing?: A Lot 6 Click Score: 16    End of Session Equipment Utilized During Treatment: Rolling walker (2 wheels);Back brace  OT Visit Diagnosis: Muscle weakness (generalized) (M62.81);Pain;History of falling (Z91.81);Other abnormalities of gait and mobility (R26.89) Pain - part of body:  (back)   Activity Tolerance Patient tolerated treatment well   Patient Left in chair;with call bell/phone within reach;with chair alarm set;with nursing/sitter in room;with family/visitor present   Nurse Communication Mobility status;Precautions  Time: 1000-1038 OT Time Calculation (min): 38 min  Charges: OT General Charges $OT Visit: 1 Visit OT Evaluation $OT Re-eval: 1 Re-eval  Barry Brunner, OT Acute Rehabilitation Services Office [phone]   Chancy Milroy 02/20/2023, 11:31 AM

## 2023-02-20 NOTE — TOC Progression Note (Signed)
Transition of Care [facility]) - Progression Note    Patient Details  Name: Katelyn [name] MRN: [id number] Date of Birth: [birth year]  Transition of Care [facility]) CM/SW Contact  Kermit Balo, RN Phone Number: 02/20/2023, 3:38 PM  Clinical Narrative:    Pt is s/p OR. Plan is for home with IV abx.  Pam with [facility] notified and patient had asked to use [facility] again for [facility]. Cory with [facility] accepted the referral. TOC following.   Expected Discharge Plan: Home w Home Health Services Barriers to Discharge: Continued Medical Work up  Expected Discharge Plan and Services   Discharge Planning Services: CM Consult   Living arrangements for the past 2 months: Single Family Home                                       Social Determinants of Health (SDOH) Interventions SDOH Screenings   Food Insecurity: No Food Insecurity (02/13/2023)  Housing: Low Risk  (02/13/2023)  Transportation Needs: No Transportation Needs (02/13/2023)  Utilities: Not At Risk (02/13/2023)  Depression (PHQ2-9): Low Risk  (02/08/2023)  Tobacco Use: Low Risk  (02/19/2023)    Readmission Risk Interventions     No data to display

## 2023-02-20 NOTE — Progress Notes (Signed)
PHARMACY CONSULT NOTE FOR:  OUTPATIENT  PARENTERAL ANTIBIOTIC THERAPY (OPAT)  Indication: Discitis/osteomyelitis  Regimen: Daptomycin 650 mg IV every 48 hours + Cefepime  2 gm IV Q 24 hours  End date: 04/03/23  IV antibiotic discharge orders are pended. To discharging provider:  please sign these orders via discharge navigator,  Select New Orders & click on the button choice - Manage This Unsigned Work.     Thank you for allowing pharmacy to be a part of this patient's care.  Sharin Mons, PharmD, BCPS, BCIDP Infectious Diseases Clinical Pharmacist Phone: [phone] 02/20/2023, 11:37 AM

## 2023-02-20 NOTE — Evaluation (Signed)
Physical Therapy Re-Evaluation Patient Details Name: Katelyn [name] MRN: [id number] DOB: Jul 08, 1956 Today's Date: 02/20/2023  History of Present Illness  Patient is a 67 y/o female admitted 02/13/23 with back pain and radiculopathy.  She was found to have discitis/infection. S/P decompressive laminectomy, medial facetectomy T11-12, posterior fixation T10-L2, and removal of spinal cord stimulator system. PMH positive for anxiety, chronic low back pain with multiple lumbar surgeries (most recent in Dec with spinal stimulator), DM2, obesity, OSA, LBBB, HTN, hypercholesterolemia, CKD, CAD.  Clinical Impression  Pt admitted with above diagnosis. Pt seen post spinal surgery, assisted with donning of brace, husband present and participatory with this as well. Pt required min A +2 for transfers and ambulation with RW. Pt ambulated 12' and 20' with seated rest in between. Pt with good support from spouse. Recommend HHPT after d/c to maximize function and safety.  Pt currently with functional limitations due to the deficits listed below (see PT Problem List). Pt will benefit from acute skilled PT to increase their independence and safety with mobility to allow discharge.          Recommendations for follow up therapy are one component of a multi-disciplinary discharge planning process, led by the attending physician.  Recommendations may be updated based on patient status, additional functional criteria and insurance authorization.  Follow Up Recommendations       Assistance Recommended at Discharge Frequent or constant Supervision/Assistance  Patient can return home with the following  Assist for transportation;Help with stairs or ramp for entrance;Two people to help with walking and/or transfers    Equipment Recommendations None recommended by PT  Recommendations for Other Services       Functional Status Assessment Patient has had a recent decline in their functional status and demonstrates the ability  to make significant improvements in function in a reasonable and predictable amount of time.     Precautions / Restrictions Precautions Precautions: Back Precaution Booklet Issued: No Precaution Comments: reviewed with pt Required Braces or Orthoses: Spinal Brace Spinal Brace: Thoracolumbosacral orthotic;Applied in sitting position Restrictions Weight Bearing Restrictions: No      Mobility  Bed Mobility Overal bed mobility: Needs Assistance             General bed mobility comments: pt sitting EOB with OT    Transfers Overall transfer level: Needs assistance Equipment used: Rolling walker (2 wheels) Transfers: Sit to/from Stand Sit to Stand: Min assist, +2 safety/equipment, +2 physical assistance           General transfer comment: to power up and steady, cueing for posture and hand placement    Ambulation/Gait Ambulation/Gait assistance: Min assist Gait Distance (Feet): 32 Feet (12', 20') Assistive device: Rolling walker (2 wheels) Gait Pattern/deviations: Shuffle, Decreased stride length, Trunk flexed Gait velocity: decreased Gait velocity interpretation: <1.31 ft/sec, indicative of household ambulator   General Gait Details: vc's for extending through hips. Pt needed seated rest after first 12', was also able to use bathroom. Min A needed for support and Wellsite geologist    Modified Rankin (Stroke Patients Only)       Balance Overall balance assessment: Needs assistance Sitting-balance support: No upper extremity supported, Feet supported Sitting balance-Leahy Scale: Fair Sitting balance - Comments: cueing for safety due to posterior lean at times, able to correct with verbal cues   Standing balance support: Bilateral upper extremity supported, During functional activity, Single extremity supported Standing balance-Leahy Scale:  Poor Standing balance comment: relies on UE and external support                              Pertinent Vitals/Pain Pain Assessment Pain Assessment: 0-10 Pain Score: 5  Pain Location: incisional back Pain Descriptors / Indicators: Discomfort, Operative site guarding Pain Intervention(s): Limited activity within patient's tolerance, Monitored during session, Repositioned    Home Living Family/patient expects to be discharged to:: Private residence Living Arrangements: Spouse/significant other Available Help at Discharge: Family Type of Home: House Home Access: Ramped entrance       Home Layout: One level Home Equipment: Agricultural consultant (2 wheels);Rollator (4 wheels);Wheelchair - manual;Shower seat;Grab bars - tub/shower;BSC/3in1;Hand held shower head      Prior Function Prior Level of Function : History of Falls (last six months);Independent/Modified Independent             Mobility Comments: usually ambulates with rollator or wc in home; uses upright walker in community; and 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 fell about a month ago reaching for flash light when she was going over threshold and walker folded ADLs Comments: completes ADLs and IADLs, reports from wc level for cooking/clenaing; driving but not recently     Hand Dominance   Dominant Hand: Right    Extremity/Trunk Assessment   Upper Extremity Assessment Upper Extremity Assessment: Defer to OT evaluation    Lower Extremity Assessment Lower Extremity Assessment: Generalized weakness RLE Deficits / Details: grossly 3+/5, fatigues quickly with exertion RLE Sensation: history of peripheral neuropathy;decreased proprioception LLE Deficits / Details: grossly 3+/5 with quick fatigue in standing. Decreased df noted with gait LLE Sensation: history of peripheral neuropathy;decreased proprioception    Cervical / Trunk Assessment Cervical / Trunk Assessment: Back Surgery  Communication   Communication: No difficulties  Cognition Arousal/Alertness: Awake/alert Behavior During  Therapy: WFL for tasks assessed/performed Overall Cognitive Status: Within Functional Limits for tasks assessed                                          General Comments General comments (skin integrity, edema, etc.): needed assist for donning and doffing brace. Reviewed back precautions    Exercises     Assessment/Plan    PT Assessment Patient needs continued PT services  PT Problem List Decreased strength;Decreased mobility;Pain;Decreased activity tolerance       PT Treatment Interventions Functional mobility training;Wheelchair mobility training;Therapeutic activities;Therapeutic exercise;Patient/family education;Gait training;Balance training;DME instruction    PT Goals (Current goals can be found in the Care Plan section)  Acute Rehab PT Goals Patient Stated Goal: return home PT Goal Formulation: With patient/family Time For Goal Achievement: 03/06/23 Potential to Achieve Goals: Good    Frequency Min 5X/week     Co-evaluation PT/OT/SLP Co-Evaluation/Treatment: Yes Reason for Co-Treatment: For patient/therapist safety PT goals addressed during session: Mobility/safety with mobility;Balance;Proper use of DME         AM-PAC PT "6 Clicks" Mobility  Outcome Measure Help needed turning from your back to your side while in a flat bed without using bedrails?: A Little Help needed moving from lying on your back to sitting on the side of a flat bed without using bedrails?: A Little Help needed moving to and from a bed to a chair (including a wheelchair)?: A Little Help needed standing up from a chair using your arms (e.g.,  wheelchair or bedside chair)?: A Lot Help needed to walk in hospital room?: A Lot Help needed climbing 3-5 steps with a railing? : Total 6 Click Score: 14    End of Session Equipment Utilized During Treatment: Back brace Activity Tolerance: Patient tolerated treatment well Patient left: in chair;with call bell/phone within reach;with  chair alarm set;with family/visitor present Nurse Communication: Mobility status PT Visit Diagnosis: Other abnormalities of gait and mobility (R26.89);Pain Pain - part of body:  (back and L > R leg)    Time: 7829-5621 PT Time Calculation (min) (ACUTE ONLY): 17 min   Charges:   PT Evaluation $PT Re-evaluation: 1 Re-eval          Lyanne Co, PT  Acute Rehab Services Secure chat preferred Office [phone]   Lawana Chambers Donika Butner 02/20/2023, 1:27 PM

## 2023-02-20 NOTE — Progress Notes (Signed)
Pt refused cpap for tonight 

## 2023-02-20 NOTE — Progress Notes (Signed)
Orthopedic Tech Progress Note Patient Details:  Katelyn [name] 1955/12/19 [id number]  Ortho Devices Type of Ortho Device: Thoracolumbar corset (TLSO) Ortho Device/Splint Location: BACK Ortho Device/Splint Interventions: Ordered, Adjustment   Post Interventions Patient Tolerated: Well Instructions Provided: Care of device  Donald Pore 02/20/2023, 8:13 AM

## 2023-02-20 NOTE — Progress Notes (Addendum)
Daily Progress Note Intern Pager: [phone]  Patient name: Katelyn [name] Medical record number: [id number] Date of birth: Oct 15, 1955 Age: 67 y.o. Gender: female  Primary Care Provider: Olive Bass, MD Consultants: ID, neurosurgery  Code Status: Full code   Pt Overview and Major Events to Date:  5/7: Admitted to FMTS 5/8: IR aspiration of thoracic spine 5/13: NSGY stabilization of T11/12, SCS removal   Assessment and Plan:  Katelyn [name] is a 67 y.o. 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 Pertinent PMH/PSH includes chronic low back pain with multiple lumbar surgeries, T2DM, obesity, OSA, LBBB, HTN, HLD, CAD, CKD stage IIIb, anxiety.   * Diskitis Successful NSGY intervention consisting of posterior fixation of T11 and L2 and removal of SCS. Per NSGY, no cultures obtain during surgery. Per ID will plan to place a PICC today and start patient on Daptomycin and Cefepime. Pain well controlled, slightly sedated so backed down on Oxy dosing and only using Morphine if severe. POing well, d/c fluids. Plan for PICC for IV abx, held due to elevated CrCl. -NSGY following, appreciate recs -ID following, appreciate recs -Start Daptomycin and Cefepime per ID since no cultures obtained, -Pain management: Tylenol 1000mg  q6h sch, Methocarbamol 500mg  q6h prn, Morphine 2mg  q2h prn, Oxycodone 5mg  q3h prn -Continue bowel regimen with Miralax and Senna 2 tabs daily -PT/OT after receiving TLSO  Hyperkalemia K 5.9, likely due to fluids with Kcl. Fluids stopped. -Lokelma x1 -BMP @ 1600  AKI (acute kidney injury) (HCC) Cr 2.18, increased from baseline in the last two days. Possibly pre-renal but will explore further. -UA -Post-void residual -Renal US  Diabetes mellitus without complication (HCC) BGL mostly in 100s, fasting was elevated to 272 this AM. Consider adding back LAI if persistently high. -sSSI with CBG checks   FEN/GI: Heart healthy/carb modifed PPx: Held  24 hours after surgery per NSGY Dispo: Home pending IV abx and clinical stabilization  Subjective:  Patient assessed at bedside with husband present. States she feels her pain is much better than it was before the surgery. States she is getting sleepy with the medication though. Back brace in room, voiced understanding to use with therapy. Not passing gas or had BM yet. Tolerating PO.  Objective: Temp:  [97.6 F (36.4 C)-98.5 F (36.9 C)] 98 F (36.7 C) (05/14 1146) Pulse Rate:  [74-102] 76 (05/14 1146) Resp:  [10-20] 18 (05/14 1146) BP: (97-152)/(40-100) 97/40 (05/14 1146) SpO2:  [95 %-100 %] 99 % (05/14 1146) Physical Exam: General: Sitting up in bed, NAD Cardiovascular: RRR without murmur Respiratory: CTAB. Normal WOB on RA Abdomen: Soft, non-distended, mildly TTP over LUQ. Hyperactive bowel sounds. Extremities: No peripheral edema and SCDs in place  Laboratory: Most recent CBC Lab Results  Component Value Date   WBC 8.7 02/20/2023   HGB 8.1 (L) 02/20/2023   HCT 24.2 (L) 02/20/2023   MCV 91.3 02/20/2023   PLT 113 (L) 02/20/2023   Most recent BMP    Latest Ref Rng & Units 02/20/2023    7:29 AM  BMP  Glucose 70 - 99 mg/dL 962   BUN 8 - 23 mg/dL 57   Creatinine 9.52 - 1.00 mg/dL 8.41   Sodium 324 - 401 mmol/L 130   Potassium 3.5 - 5.1 mmol/L 5.9   Chloride 98 - 111 mmol/L 98   CO2 22 - 32 mmol/L 24   Calcium 8.9 - 10.3 mg/dL 8.5     Other pertinent labs:  Glu: 272  INR: 1.1  Imaging/Diagnostic Tests: DG THORACOLUMBAR SPINE Result Date: 02/19/2023 IMPRESSION: Multiple intraoperative fluoroscopic spot images are provided for the procedure described above.  [name], MD 02/20/2023, 12:54 PM  PGY-1, [facility] FPTS Intern pager: [phone], text pages welcome Secure chat group [facility] Teaching Service

## 2023-02-20 NOTE — Assessment & Plan Note (Signed)
Resolved. Trend BMP

## 2023-02-20 NOTE — Progress Notes (Addendum)
[facility]  Date of Admission:  02/13/2023      Total days of antibiotics 1  Daptomycin  Cefepime           ASSESSMENT: Katelyn [name] is a 67 y.o. female admitted with acute on chronic worsening of back pain due to unstable thoracic osteomyelitis in the setting of what seems to be infected nerve stimulator with progressive vertebral collapse of thoracic vertebra that will now require surgical stabilization. In anticipation for surgery, we have held antibiotics (nearly 1 week).   POD1 T10-L2 fusion and she hopes removal of stimulator with Dr. Yetta Barre. No cultures taken at the time of surgery with no occult evidence of infection - will plan for 6 week course of higher dose daptomycin + cefipime renally adjusted. Will need ongoing oral therapy as well after this.   Acute Kidney Injury - continued to acutely worsened during this admission with eGFR < 30 mL/min today.  She has not received any vancomycin this admission and based on timeline last dose was around April 10th.  D/W FMT and they are working up other considerations. May benefit from formal nephrology consult given vancomycin is not likely the culprit here.   Access - would prefer PICC line for her as this is easier for outpatient removal. With eGFR < 30 will need nephrology approval.    PLAN: OK to place PICC line - Dr. Ardyth Harps and FMT will reach out to nephrology for approval given GFR < 45 Continue daptomycin + cefepime  Will need close kidney function monitoring for dosing    OPAT ORDERS:  Diagnosis: Vertebral infection   Culture Result: no cultures   Allergies  Allergen Reactions   Atorvastatin Nausea And Vomiting and Other (See Comments)    MYALGIAS   Talwin [Pentazocine] Other (See Comments)    headache   Gabapentin Swelling   Other Nausea Only    UNSPECIFIED Anesthesia     Discharge antibiotics to be given via PICC line:  Daptomycin 10 mg / kg Q48h   + Cefepime 2 gm IV  Q24h   Duration: 6 weeks   End Date: 04/03/2023  [facility] Care Per Protocol with Biopatch Use: Home health RN for IV administration and teaching, line care and labs.    Labs weekly while on IV antibiotics: _x_ CBC with differential __ BMP **TWICE WEEKLY ON VANCOMYCIN  _x_ CMP _x_ CRP _x_ ESR __ Vancomycin trough TWICE WEEKLY _x_ CK  _x_ Please pull PIC at completion of IV antibiotics __ Please leave PIC in place until doctor has seen patient or been notified  Fax weekly labs to [phone]  Clinic Follow Up Appt: 03/15/23 @ [facility] with Dr. Earlene Plater      Principal Problem:   Diskitis Active Problems:   Discitis   Malfunction of spinal cord stimulator (HCC)   Serratia   Diabetes mellitus without complication (HCC)   Status post thoracic spinal fusion    acetaminophen  1,000 mg Oral Q6H   allopurinol  100 mg Oral QHS   carvedilol  6.25 mg Oral BID   DULoxetine  60 mg Oral QHS   insulin aspart  0-15 Units Subcutaneous TID WC   lidocaine  2 patch Transdermal Daily   mirtazapine  30 mg Oral QHS   pantoprazole  40 mg Oral Daily   polyethylene glycol  17 g Oral Daily   pramipexole  1 mg Oral QHS   pregabalin  75 mg  Oral QHS   senna  1 tablet Oral BID   sodium chloride flush  3 mL Intravenous Q12H    SUBJECTIVE: Doing well after surgery - reports that the leg pain she had prior to is gone.   Review of Systems: Review of Systems  Constitutional:  Negative for chills and fever.  Musculoskeletal:  Positive for back pain.    Allergies  Allergen Reactions   Atorvastatin Nausea And Vomiting and Other (See Comments)    MYALGIAS   Talwin [Pentazocine] Other (See Comments)    headache   Gabapentin Swelling   Other Nausea Only    UNSPECIFIED Anesthesia    OBJECTIVE: Vitals:   02/19/23 1945 02/19/23 2015 02/19/23 2330 02/20/23 0337  BP: 138/69 134/67 138/61 128/60  Pulse: 97 94 86 74  Resp: 10 20 16 16   Temp: 97.7 F (36.5 C) 98.2 F (36.8 C) 97.9 F (36.6 C)  97.6 F (36.4 C)  TempSrc:  Oral Oral Oral  SpO2: 95% 98% 100% 100%  Weight:      Height:       Body mass index is 34.4 kg/m.  Physical Exam Cardiovascular:     Rate and Rhythm: Normal rate and regular rhythm.  Pulmonary:     Effort: Pulmonary effort is normal.     Breath sounds: Normal breath sounds.  Abdominal:     General: Bowel sounds are normal. There is no distension.     Palpations: Abdomen is soft.  Skin:    General: Skin is warm and dry.  Neurological:     Mental Status: She is alert.     Lab Results Lab Results  Component Value Date   WBC 8.7 02/20/2023   HGB 8.1 (L) 02/20/2023   HCT 24.2 (L) 02/20/2023   MCV 91.3 02/20/2023   PLT 113 (L) 02/20/2023    Lab Results  Component Value Date   CREATININE 2.18 (H) 02/20/2023   BUN 57 (H) 02/20/2023   NA 130 (L) 02/20/2023   K 5.9 (H) 02/20/2023   CL 98 02/20/2023   CO2 24 02/20/2023    Lab Results  Component Value Date   ALT 25 12/06/2022   AST 34 12/06/2022   ALKPHOS 153 (H) 12/06/2022   BILITOT 0.6 12/06/2022     Microbiology: Recent Results (from the past 240 hour(s))  Blood culture (routine x 2)     Status: None   Collection Time: 02/13/23  7:44 PM   Specimen: BLOOD  Result Value Ref Range Status   Specimen Description BLOOD SITE NOT SPECIFIED  Final   Special Requests   Final    BOTTLES DRAWN AEROBIC AND ANAEROBIC Blood Culture results may not be optimal due to an excessive volume of blood received in culture bottles   Culture   Final    NO GROWTH 5 DAYS Performed at [facility], [address] [place], [place] [address]    Report Status 02/18/2023 FINAL  Final  Blood culture (routine x 2)     Status: None   Collection Time: 02/13/23  7:54 PM   Specimen: BLOOD  Result Value Ref Range Status   Specimen Description BLOOD SITE NOT SPECIFIED  Final   Special Requests   Final    BOTTLES DRAWN AEROBIC AND ANAEROBIC Blood Culture results may not be optimal due to an excessive volume of  blood received in culture bottles   Culture   Final    NO GROWTH 5 DAYS Performed at [facility],  [address] [place], [place] [address]    Report Status 02/18/2023 FINAL  Final  Aerobic/Anaerobic Culture w Gram Stain (surgical/deep wound)     Status: None   Collection Time: 02/14/23  1:00 PM   Specimen: Wound; Fine Needle Aspirate  Result Value Ref Range Status   Specimen Description WOUND  Final   Special Requests DISC SPACE T11 T12  Final   Gram Stain NO WBC SEEN NO ORGANISMS SEEN   Final   Culture   Final    No growth aerobically or anaerobically. Performed at [facility], [address], [place], [place] [address]    Report Status 02/19/2023 FINAL  Final  Fungus Culture With Stain     Status: None (Preliminary result)   Collection Time: 02/14/23  1:15 PM   Specimen: Intervertebral Disc  Result Value Ref Range Status   Fungus Stain Final report  Final    Comment: (NOTE) Performed At: [facility] [address] [place], [place] [id number] Jolene Schimke MD WG:[id number]    Fungus (Mycology) Culture PENDING  Incomplete   Fungal Source WOUND  Final    Comment: DISC SPACE T11,T12 Performed at [facility], [address] [place], [place] [address]   Fungus Culture Result     Status: None   Collection Time: 02/14/23  1:15 PM  Result Value Ref Range Status   Result 1 Comment  Final    Comment: (NOTE) KOH/Calcofluor preparation:  no fungus observed. Performed At: [facility] [address] [place], [place] [id number] Jolene Schimke MD KG:[id number]   Surgical pcr screen     Status: None   Collection Time: 02/19/23  8:35 AM   Specimen: Nasal Mucosa; Nasal Swab  Result Value Ref Range Status   MRSA, PCR NEGATIVE NEGATIVE Final   Staphylococcus aureus NEGATIVE NEGATIVE Final    Comment: (NOTE) The Xpert SA Assay (FDA approved for NASAL specimens in patients 80 years of age and older), is one component of a  comprehensive surveillance program. It is not intended to diagnose infection nor to guide or monitor treatment. Performed at [facility], [address], [place], [place] [address]      Rexene Alberts, MSN, NP-C [facility]  [email or web address] Pager: [phone] Office: [phone] [facility] Main Line: [phone] *Secure Chat Communication Welcome   Total Encounter - 35 minutes

## 2023-02-20 NOTE — Progress Notes (Signed)
Pt on room air and tolerating with no distress noted. Pt refusing Cpap QHS will continue to monitor

## 2023-02-20 NOTE — Progress Notes (Signed)
Patient ID: Katelyn [name], female   DOB: [birth year], 67 y.o.   MRN: [id number] Doing great POD1, moving legs well, pain well controlled, awaiting TLSO then mobilize with therapy

## 2023-02-20 NOTE — Assessment & Plan Note (Signed)
Pending AM BMP. UA unremarkable and post-void residual 0. Renal ultrasound without hydronephrosis. No clear underlying cause of kidney injury. -Consider Nephrology consult is renal function continues to worsen

## 2023-02-21 DIAGNOSIS — N179 Acute kidney failure, unspecified: Secondary | ICD-10-CM | POA: Diagnosis not present

## 2023-02-21 DIAGNOSIS — I959 Hypotension, unspecified: Secondary | ICD-10-CM

## 2023-02-21 LAB — CBC
HCT: 23.5 % — ABNORMAL LOW (ref 36.0–46.0)
Hemoglobin: 7.7 g/dL — ABNORMAL LOW (ref 12.0–15.0)
MCH: 30.6 pg (ref 26.0–34.0)
MCHC: 32.8 g/dL (ref 30.0–36.0)
MCV: 93.3 fL (ref 80.0–100.0)
Platelets: 145 10*3/uL — ABNORMAL LOW (ref 150–400)
RBC: 2.52 MIL/uL — ABNORMAL LOW (ref 3.87–5.11)
RDW: 15.3 % (ref 11.5–15.5)
WBC: 7.4 10*3/uL (ref 4.0–10.5)
nRBC: 0 % (ref 0.0–0.2)

## 2023-02-21 LAB — BASIC METABOLIC PANEL
Anion gap: 9 (ref 5–15)
BUN: 54 mg/dL — ABNORMAL HIGH (ref 8–23)
CO2: 23 mmol/L (ref 22–32)
Calcium: 8.8 mg/dL — ABNORMAL LOW (ref 8.9–10.3)
Chloride: 98 mmol/L (ref 98–111)
Creatinine, Ser: 1.63 mg/dL — ABNORMAL HIGH (ref 0.44–1.00)
GFR, Estimated: 35 mL/min — ABNORMAL LOW (ref >60)
Glucose, Bld: 201 mg/dL — ABNORMAL HIGH (ref 70–99)
Potassium: 4.4 mmol/L (ref 3.5–5.1)
Sodium: 130 mmol/L — ABNORMAL LOW (ref 135–145)

## 2023-02-21 LAB — C-REACTIVE PROTEIN: CRP: 3.4 mg/dL — ABNORMAL HIGH (ref <1.0)

## 2023-02-21 LAB — CK: Total CK: 131 U/L (ref 38–234)

## 2023-02-21 LAB — SEDIMENTATION RATE: Sed Rate: 55 mm/hr — ABNORMAL HIGH (ref 0–22)

## 2023-02-21 LAB — GLUCOSE, CAPILLARY
Glucose-Capillary: 153 mg/dL — ABNORMAL HIGH (ref 70–99)
Glucose-Capillary: 305 mg/dL — ABNORMAL HIGH (ref 70–99)
Glucose-Capillary: 188 mg/dL — ABNORMAL HIGH (ref 70–99)

## 2023-02-21 MED ORDER — HYDROCODONE-ACETAMINOPHEN 5-325 MG PO TABS
1.0000 | ORAL_TABLET | Freq: Four times a day (QID) | ORAL | Status: DC | PRN
  Filled 2023-02-21: qty 1

## 2023-02-21 MED ORDER — BISACODYL 10 MG RE SUPP
10.0000 mg | Freq: Once | RECTAL | Status: AC
  Administered 2023-02-21: 10 mg via RECTAL
  Filled 2023-02-21 (×2): qty 1

## 2023-02-21 MED ORDER — OXYCODONE HCL 5 MG PO TABS
5.0000 mg | ORAL_TABLET | Freq: Four times a day (QID) | ORAL | Status: DC | PRN
  Administered 2023-02-21 – 2023-02-22 (×2): 5 mg via ORAL
  Filled 2023-02-21 (×2): qty 1

## 2023-02-21 MED ORDER — INSULIN GLARGINE-YFGN 100 UNIT/ML ~~LOC~~ SOLN
10.0000 [IU] | Freq: Every day | SUBCUTANEOUS | Status: DC
  Administered 2023-02-21 – 2023-02-22 (×2): 10 [IU] via SUBCUTANEOUS
  Filled 2023-02-21 (×2): qty 0.1

## 2023-02-21 MED ORDER — ACETAMINOPHEN 325 MG PO TABS
650.0000 mg | ORAL_TABLET | Freq: Four times a day (QID) | ORAL | Status: DC
  Administered 2023-02-21 – 2023-02-22 (×6): 650 mg via ORAL
  Filled 2023-02-21 (×6): qty 2

## 2023-02-21 NOTE — Inpatient Diabetes Management (Signed)
Inpatient Diabetes Program Recommendations  AACE/ADA: New Consensus Statement on Inpatient Glycemic Control (2015)  Target Ranges:  Prepandial:   less than 140 mg/dL      Peak postprandial:   less than 180 mg/dL (1-2 hours)      Critically ill patients:  140 - 180 mg/dL   Lab Results  Component Value Date   GLUCAP 305 (H) 02/21/2023   HGBA1C 7.2 (H) 12/07/2022    Latest Reference Range & Units 02/20/23 06:43 02/20/23 11:46 02/20/23 16:17 02/20/23 21:11 02/21/23 06:16 02/21/23 11:25  Glucose-Capillary 70 - 99 mg/dL 161 (H) Novolog 8 units 190 (H) Novolog 3 units 280 (H) Novolog 8 units 234 (H) 153 (H) Novolog 3 units 305 (H) Novolog 10 units  (H): Data is abnormally high   Diabetes history: DM2 Outpatient Diabetes medications: Tresiba 120 QD, Novolog 55 TID + correction (ISF 5; Start >150), Metformin 500 QD, FSL 3  Current orders for Inpatient glycemic control: Semglee 10 units, Novolog 0-15 units tid  Inpatient Diabetes Program Recommendations:   Please consider: -Add Novolog 3 units tid meal coverage if eats 50% -Add Novolog 0-5 units hs correction  Thank you, Darel Hong E. Kiernan Farkas, RN, MSN, CDE  Diabetes Coordinator Inpatient Glycemic Control Team Team Pager [phone] (8am-5pm) 02/21/2023 1:09 PM

## 2023-02-21 NOTE — Assessment & Plan Note (Addendum)
Post op hemoglobin dropped to 8.1 and now 7.7 this AM. Patient received 1L fluids, possible dilutional component. No signs of bleeding -Recheck CBC in AM

## 2023-02-21 NOTE — Progress Notes (Signed)
Patient ID: Katelyn [name], female   DOB: [birth year], 67 y.o.   MRN: [id number] Looks great POD2, much less pain than pre-op, no leg pain or electrical pains, no ntw, moving legs well, dressing dry. Mobilize with PT/OT. Abx per ID

## 2023-02-21 NOTE — Progress Notes (Addendum)
Daily Progress Note Intern Pager: [phone]  Patient name: Katelyn [name] Medical record number: [id number] Date of birth: [birth year] Age: 67 y.o. Gender: female  Primary Care Provider: Olive Bass, MD Consultants: ID, neurosurgery  Code Status: Full code   Pt Overview and Major Events to Date:  5/7: Admitted to FMTS 5/8: IR aspiration of thoracic spine 5/13: NSGY stabilization of T11/12, SCS removal   Assessment and Plan:  Katelyn [name] is a 67 y.o. 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 Pertinent PMH/PSH includes chronic low back pain with multiple lumbar surgeries, T2DM, obesity, OSA, LBBB, HTN, HLD, CAD, CKD stage IIIb, anxiety.   * Diskitis POD #2 from NSGY intervention. Pain well controlled, will back off on pain regimen. Passing gas but no BM. Ambulating around the room, will continue to work with PT/OT in anticipation of home discharge. Pending PICC placement given worsening renal function. -NSGY following, appreciate recs -ID following, appreciate recs -Place PICC, then start Daptomycin and Cefepime per ID since no cultures obtained -Pain management: Tylenol 1000mg  q6h sch, Oxycodone 5mg  q3h prn -D/c Methocarbamol 500mg  q6h prn, Morphine 2mg  q2h prn -Continue bowel regimen with Miralax and Senna 2 tabs daily -Mobilization with PT/OT  Hypotension Soft Bps overnight, required 1L NS bolus with improvement in BP. Stable this AM. -Monitor BP closely -Caution with fluids given h/o HF  AKI (acute kidney injury) (HCC) Pending AM BMP. UA unremarkable and post-void residual 0. Renal ultrasound without hydronephrosis. No clear underlying cause of kidney injury. -Consider Nephrology consult is renal function continues to worsen  Diabetes mellitus without complication (HCC) BGL in 200s now that patient is back to regular diet. -Add Semglee 10U -sSSI with CBG checks  Anemia of chronic disease Post op hemoglobin dropped to 8.1 and now 7.7 this  AM. Patient received 1L fluids, possible dilutional component. No signs of bleeding -Recheck CBC in AM  Hyperkalemia-resolved as of 02/21/2023 Resolved. Trend BMP   FEN/GI: Heart healthy/carb modifed PPx: Held 24 hours after surgery per NSGY Dispo: Home pending IV abx and clinical stabilization  Subjective:  Patient assessed at bedside, husband present. Patient sitting up in the chair this AM and looks well. States she might be getting too sleepy from the pain medications, discussed taking off muscle relaxer and Morphine. Patient ambulating around the room. Passing gas but no BM yet.  Objective: Temp:  [97.9 F (36.6 C)-100 F (37.8 C)] 97.9 F (36.6 C) (05/15 0305) Pulse Rate:  [76-81] 76 (05/15 0305) Resp:  [16-18] 16 (05/15 0305) BP: (85-148)/(36-54) 122/54 (05/15 0305) SpO2:  [90 %-99 %] 98 % (05/15 0305) Physical Exam: General: Sitting up in chair, NAD Cardiovascular: RRR without murmur Respiratory: CTAB. Normal WOB on RA Abdomen: Soft, mild distension, non-tender Extremities: Mild peripheral edema  Laboratory: Most recent CBC Lab Results  Component Value Date   WBC 7.4 02/21/2023   HGB 7.7 (L) 02/21/2023   HCT 23.5 (L) 02/21/2023   MCV 93.3 02/21/2023   PLT 145 (L) 02/21/2023   Most recent BMP    Latest Ref Rng & Units 02/21/2023    8:22 AM  BMP  Glucose 70 - 99 mg/dL 841   BUN 8 - 23 mg/dL 54   Creatinine 3.24 - 1.00 mg/dL 4.01   Sodium 027 - 253 mmol/L 130   Potassium 3.5 - 5.1 mmol/L 4.4   Chloride 98 - 111 mmol/L 98   CO2 22 - 32 mmol/L 23   Calcium 8.9 -  10.3 mg/dL 8.8     Other pertinent labs: Glu: 153  Imaging/Diagnostic Tests: US RENAL Result Date: 02/20/2023 IMPRESSION: No significant sonographic abnormality of the kidneys.    [name], MD 02/21/2023, 11:06 AM  PGY-1, [facility] Family Medicine FPTS Intern pager: [phone], text pages welcome Secure chat group [facility] Teaching Service

## 2023-02-21 NOTE — Progress Notes (Signed)
Physical Therapy Treatment Patient Details Name: Katelyn [name] MRN: [id number] DOB: 06/26/1956 Today's Date: 02/21/2023   History of Present Illness Patient is a 67 y/o female admitted 02/13/23 with back pain and radiculopathy.  She was found to have discitis/infection. S/P decompressive laminectomy, medial facetectomy T11-12, posterior fixation T10-L2, and removal of spinal cord stimulator system. PMH positive for anxiety, chronic low back pain with multiple lumbar surgeries (most recent in Dec with spinal stimulator), DM2, obesity, OSA, LBBB, HTN, hypercholesterolemia, CKD, CAD.    PT Comments    Patient received in recliner, she reports she has been up since 7 and is getting tired. Patient agrees to PT session. She performed STS with min guard. Ambulated 100 feet with RW and min guard. Min A to don/doff TLSO. Cues for maintaining back precautions. Patient will continue to benefit from skilled PT to improve functional independence and safety with mobility.     Recommendations for follow up therapy are one component of a multi-disciplinary discharge planning process, led by the attending physician.  Recommendations may be updated based on patient status, additional functional criteria and insurance authorization.  Follow Up Recommendations       Assistance Recommended at Discharge Intermittent Supervision/Assistance  Patient can return home with the following Assist for transportation;Help with stairs or ramp for entrance;A little help with walking and/or transfers   Equipment Recommendations  None recommended by PT    Recommendations for Other Services       Precautions / Restrictions Precautions Precautions: Back Precaution Comments: reviewed with pt Required Braces or Orthoses: Spinal Brace Spinal Brace: Thoracolumbosacral orthotic;Applied in sitting position Restrictions Weight Bearing Restrictions: No     Mobility  Bed Mobility Overal bed mobility: Needs Assistance Bed  Mobility: Sit to Supine       Sit to supine: Min assist        Transfers Overall transfer level: Needs assistance Equipment used: Rolling walker (2 wheels) Transfers: Sit to/from Stand Sit to Stand: Min guard                Ambulation/Gait Ambulation/Gait assistance: Min guard Gait Distance (Feet): 100 Feet Assistive device: Rolling walker (2 wheels) Gait Pattern/deviations: Step-through pattern, Decreased step length - right, Decreased step length - left, Trunk flexed Gait velocity: decreased     General Gait Details: R le weakness reported during ambulation.   Stairs             Wheelchair Mobility    Modified Rankin (Stroke Patients Only)       Balance Overall balance assessment: Needs assistance Sitting-balance support: Feet supported Sitting balance-Leahy Scale: Normal     Standing balance support: Bilateral upper extremity supported, During functional activity, Reliant on assistive device for balance Standing balance-Leahy Scale: Fair Standing balance comment: relies on UE                            Cognition Arousal/Alertness: Awake/alert Behavior During Therapy: WFL for tasks assessed/performed Overall Cognitive Status: Within Functional Limits for tasks assessed                                          Exercises      General Comments        Pertinent Vitals/Pain Pain Assessment Pain Assessment: Faces Faces Pain Scale: Hurts little more Pain Location: incisional back Pain Descriptors /  Indicators: Discomfort, Operative site guarding, Grimacing Pain Intervention(s): Monitored during session    Home Living                          Prior Function            PT Goals (current goals can now be found in the care plan section) Acute Rehab PT Goals Patient Stated Goal: return home PT Goal Formulation: With patient/family Time For Goal Achievement: 03/06/23 Potential to Achieve Goals:  Good Progress towards PT goals: Progressing toward goals    Frequency    Min 5X/week      PT Plan Current plan remains appropriate    Co-evaluation              AM-PAC PT "6 Clicks" Mobility   Outcome Measure  Help needed turning from your back to your side while in a flat bed without using bedrails?: A Little Help needed moving from lying on your back to sitting on the side of a flat bed without using bedrails?: A Little Help needed moving to and from a bed to a chair (including a wheelchair)?: A Little Help needed standing up from a chair using your arms (e.g., wheelchair or bedside chair)?: A Little Help needed to walk in hospital room?: A Little Help needed climbing 3-5 steps with a railing? : A Little 6 Click Score: 18    End of Session Equipment Utilized During Treatment: Back brace Activity Tolerance: Patient tolerated treatment well Patient left: in bed;with call bell/phone within reach;with bed alarm set;with family/visitor present Nurse Communication: Mobility status PT Visit Diagnosis: Other abnormalities of gait and mobility (R26.89);Pain Pain - part of body:  (back)     Time: 0940-1000 PT Time Calculation (min) (ACUTE ONLY): 20 min  Charges:  $Gait Training: 8-22 mins                     Caliegh Middlekauff, PT, GCS 02/21/23,10:15 AM

## 2023-02-21 NOTE — Assessment & Plan Note (Addendum)
Soft Bps overnight, required 1L NS bolus with improvement in BP. Stable this AM. -Monitor BP closely -Caution with fluids given h/o HF

## 2023-02-22 ENCOUNTER — Other Ambulatory Visit (HOSPITAL_COMMUNITY): Payer: Self-pay

## 2023-02-22 DIAGNOSIS — M4644 Discitis, unspecified, thoracic region: Secondary | ICD-10-CM | POA: Diagnosis not present

## 2023-02-22 LAB — BASIC METABOLIC PANEL
Anion gap: 10 (ref 5–15)
BUN: 46 mg/dL — ABNORMAL HIGH (ref 8–23)
CO2: 21 mmol/L — ABNORMAL LOW (ref 22–32)
Calcium: 8.9 mg/dL (ref 8.9–10.3)
Chloride: 100 mmol/L (ref 98–111)
Creatinine, Ser: 1.31 mg/dL — ABNORMAL HIGH (ref 0.44–1.00)
GFR, Estimated: 45 mL/min — ABNORMAL LOW (ref >60)
Glucose, Bld: 229 mg/dL — ABNORMAL HIGH (ref 70–99)
Potassium: 4.9 mmol/L (ref 3.5–5.1)
Sodium: 131 mmol/L — ABNORMAL LOW (ref 135–145)

## 2023-02-22 LAB — CBC
HCT: 20.9 % — ABNORMAL LOW (ref 36.0–46.0)
Hemoglobin: 6.8 g/dL — CL (ref 12.0–15.0)
MCH: 30.5 pg (ref 26.0–34.0)
MCHC: 32.5 g/dL (ref 30.0–36.0)
MCV: 93.7 fL (ref 80.0–100.0)
Platelets: 111 10*3/uL — ABNORMAL LOW (ref 150–400)
RBC: 2.23 MIL/uL — ABNORMAL LOW (ref 3.87–5.11)
RDW: 15.3 % (ref 11.5–15.5)
WBC: 5.6 10*3/uL (ref 4.0–10.5)
nRBC: 0 % (ref 0.0–0.2)

## 2023-02-22 LAB — GLUCOSE, CAPILLARY
Glucose-Capillary: 238 mg/dL — ABNORMAL HIGH (ref 70–99)
Glucose-Capillary: 191 mg/dL — ABNORMAL HIGH (ref 70–99)
Glucose-Capillary: 296 mg/dL — ABNORMAL HIGH (ref 70–99)
Glucose-Capillary: 189 mg/dL — ABNORMAL HIGH (ref 70–99)

## 2023-02-22 LAB — TYPE AND SCREEN

## 2023-02-22 LAB — HEMOGLOBIN AND HEMATOCRIT, BLOOD
HCT: 24.7 % — ABNORMAL LOW (ref 36.0–46.0)
Hemoglobin: 8.1 g/dL — ABNORMAL LOW (ref 12.0–15.0)

## 2023-02-22 LAB — PREPARE RBC (CROSSMATCH)

## 2023-02-22 LAB — BPAM RBC: Unit Type and Rh: 6200

## 2023-02-22 MED ORDER — INSULIN GLARGINE-YFGN 100 UNIT/ML ~~LOC~~ SOLN
20.0000 [IU] | Freq: Every day | SUBCUTANEOUS | Status: DC
  Filled 2023-02-22: qty 0.2

## 2023-02-22 MED ORDER — OXYCODONE HCL 5 MG PO TABS
2.5000 mg | ORAL_TABLET | Freq: Four times a day (QID) | ORAL | 0 refills | Status: DC | PRN
  Filled 2023-02-22: qty 30, 15d supply, fill #0

## 2023-02-22 MED ORDER — CHLORHEXIDINE GLUCONATE CLOTH 2 % EX PADS
6.0000 | MEDICATED_PAD | Freq: Every day | CUTANEOUS | Status: DC
  Administered 2023-02-22: 6 via TOPICAL

## 2023-02-22 MED ORDER — CEFEPIME IV (FOR PTA / DISCHARGE USE ONLY)
2.0000 g | Freq: Two times a day (BID) | INTRAVENOUS | 0 refills | Status: DC
Start: 2023-02-22 — End: 2023-03-18

## 2023-02-22 MED ORDER — LIDOCAINE 5 % EX PTCH: 2.0000 | MEDICATED_PATCH | Freq: Every day | CUTANEOUS | 0 refills | Status: DC

## 2023-02-22 MED ORDER — SODIUM CHLORIDE 0.9 % IV SOLN
10.0000 mg/kg | INTRAVENOUS | Status: DC
  Administered 2023-02-22: 650 mg via INTRAVENOUS
  Filled 2023-02-22 (×2): qty 13

## 2023-02-22 MED ORDER — SODIUM CHLORIDE 0.9% FLUSH
10.0000 mL | Freq: Two times a day (BID) | INTRAVENOUS | Status: DC
  Administered 2023-02-22: 10 mL

## 2023-02-22 MED ORDER — DAPTOMYCIN IV (FOR PTA / DISCHARGE USE ONLY)
650.0000 mg | INTRAVENOUS | 0 refills | Status: DC
Start: 2023-02-22 — End: 2023-03-18

## 2023-02-22 MED ORDER — SODIUM CHLORIDE 0.9% IV SOLUTION: Freq: Once | INTRAVENOUS | Status: DC

## 2023-02-22 MED ORDER — SODIUM CHLORIDE 0.9 % IV SOLN
2.0000 g | Freq: Two times a day (BID) | INTRAVENOUS | Status: DC
  Administered 2023-02-22: 2 g via INTRAVENOUS
  Filled 2023-02-22: qty 12.5

## 2023-02-22 MED ORDER — TRESIBA FLEXTOUCH 200 UNIT/ML ~~LOC~~ SOPN: 20.0000 [IU] | PEN_INJECTOR | Freq: Every evening | SUBCUTANEOUS | 0 refills | Status: DC

## 2023-02-22 MED ORDER — SODIUM CHLORIDE 0.9% FLUSH: 10.0000 mL | INTRAVENOUS | Status: DC | PRN

## 2023-02-22 MED ORDER — ACETAMINOPHEN 325 MG PO TABS: 650.0000 mg | ORAL_TABLET | Freq: Four times a day (QID) | ORAL | Status: DC

## 2023-02-22 MED ORDER — HEPARIN SOD (PORK) LOCK FLUSH 100 UNIT/ML IV SOLN: 250.0000 [IU] | INTRAVENOUS | Status: DC | PRN

## 2023-02-22 NOTE — Discharge Summary (Signed)
Family Medicine Teaching [facility] Discharge Summary  Patient name: Katelyn [name] Medical record number: [id number] Date of birth: [birth year] Age: 67 y.o. Gender: female Date of Admission: 02/13/2023  Date of Discharge: 02/22/2023 Admitting Physician: Carney Living, MD  Primary Care Provider: Olive Bass, MD Consultants: NSGY, ID  Indication for Hospitalization: Discitis  Discharge Diagnoses/Problem List:  Principal Problem for Admission: Discitis Other Problems addressed during stay:  Principal Problem:   Diskitis Active Problems:   Anemia of chronic disease   Discitis   Malfunction of spinal cord stimulator (HCC)   Serratia   Diabetes mellitus without complication (HCC)   Status post thoracic spinal fusion   AKI (acute kidney injury) [facility])  Brief Hospital Course:  Katelyn [name] is a 68 y.o.female with a history of  anxiety, chronic low back pain with multiple lumbar surgeries, type 2 diabetes, obesity, OSA, left bundle branch block, hypertension, hypercholesterolemia, CKD, CAD who was admitted to the [facility] Medicine Teaching Service at [facility] for back pain. Her hospital course is detailed below:  Discitis, T11/T12 H/o chronic back pain with five spinal surgeries most recently insertion of nerve stimulator in 09/2022. Admitted for worsening back pain in the setting of failed outpatient ID management with 6 weeks antibiotics for discitis. 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 Patient underwent vertebral aspiration with IR, fluid was culture but did not show any organism growth. NSGY consulted and performed spinal stabilization and nerve stimulator removal on 5/13. No cultures were obtained per NSGY. ID consulted and recommended 6 weeks of Daptomycin and Cefepime via PICC since no culture obtained. Patient mobilized with PT using TLSO. Tolerating PO with good output and pain well controlled at the time of discharge  AKI Hypotension Patient  Cr increased from ~1.5 to 2.2 during hospital course. [place], renal US and post void residual all normal. Patient mildly hypotensive during that time, asymptomatic. Received 1L fluid with significant improvement in CrCl and BP. CrCl at baseline and stable BP at the time of discharge.  T2DM Home [name] was held initially. Treated with SSI. As diet advanced post-op, added back LAI that was titrated to 20U at the time of discharge. Advised patient to continue sliding scale at meal times upon discharge.   PCP Follow-up Recommendations:  DM regimen: Required much less insulin than home regimen. Titrate up as appropriate  Disposition: Home with [facility]  Discharge Condition: Stable  Discharge Exam:  Vitals:   02/22/23 1504 02/22/23 1546  BP: (!) 132/53 (!) 130/55  Pulse: 74 74  Resp: 18 18  Temp: 97.9 F (36.6 C) 98.1 F (36.7 C)  SpO2: 100% 99%   General: Sitting up in chair, NAD Cardiovascular: RRR without murmur Respiratory: CTAB. Normal WOB on RA Abdomen: Soft, non-distended, non-tender Extremities: Mild peripheral edema  Significant Procedures:  1. Decompressive thoracic laminectomy, medial facetectomy T11-12 3. Posterior fixation T10 and L2 inclusive using Alphatec  pedicle screws.  4. Intertransverse arthrodesis T10 to L2 inclusive using morcellized autograft and allograft. 4.  Removal of entire spinal cord stimulator system  Significant Labs and Imaging:  Recent Labs  Lab 02/21/23 0822 02/22/23 0442  WBC 7.4 5.6  HGB 7.7* 6.8*  HCT 23.5* 20.9*  PLT 145* 111*   Recent Labs  Lab 02/21/23 0822 02/22/23 0442  NA 130* 131*  K 4.4 4.9  CL 98 100  CO2 23 21*  GLUCOSE 201* 229*  BUN 54* 46*  CREATININE 1.63* 1.31*  CALCIUM 8.8* 8.9    Pertinent  Imaging: US RENAL Result Date: 02/20/2023 IMPRESSION: No significant sonographic abnormality of the kidneys.   DG THORACOLUMBAR SPINE Result Date: 02/19/2023 IMPRESSION: Multiple intraoperative fluoroscopic spot images are  provided for the procedure described above.  CT THORACIC SPINE WO CONTRAST Result Date: 02/15/2023 IMPRESSION: 1. Advanced endplate destruction at T11-12 with gas in the disc space, consistent with discitis-osteomyelitis. 2. No acute osseous abnormality of the lumbar spine. L2-5 fusion with solid arthrodesis. 3. Degenerative findings better characterized on yesterday's lumbar spine MRI. Aortic Atherosclerosis (ICD10-I70.0).   CT LUMBAR SPINE WO CONTRAST Result Date: 02/15/2023 IMPRESSION: 1. Advanced endplate destruction at T11-12 with gas in the disc space, consistent with discitis-osteomyelitis. 2. No acute osseous abnormality of the lumbar spine. L2-5 fusion with solid arthrodesis. 3. Degenerative findings better characterized on yesterday's lumbar spine MRI. Aortic Atherosclerosis (ICD10-I70.0).   IR THORACIC DISC ASPIRATION W/IMG GUIDE Result Date: 02/14/2023 IMPRESSION: Successful fluoroscopy guided fine-needle aspiration of the T11-12 intervertebral disc. Samples obtained were sent for Gram stain and culture.  CT HEAD WO CONTRAST ( ) Result Date: 02/13/2023 IMPRESSION: Negative noncontrast head CT.   MR Lumbar Spine W Wo Contrast Result Date: 02/13/2023 IMPRESSION: 1. Persistent and progressed discitis/osteomyelitis at T11-T12 with worsening endplate destruction and loss of vertebral body height at both levels, worse at T12. 2. Epidural phlegmon is similar to the prior study without discrete abscess. Unchanged moderate spinal canal stenosis and severe bilateral neural foraminal stenosis at T11-T12. 3. Probable associated right-sided septic arthritis at T11-T12, similar to the prior study. No convincing left-sided septic arthritis. 4. No other evidence of infection in the thoracic or lumbar spine. 5. Postsurgical changes reflecting posterior instrumented fusion from L2 through L5 without residual significant spinal canal or neural foraminal stenosis at these levels. 6. Adjacent segment disease at  L1-L2 resulting in mild spinal canal stenosis and mild left neural foraminal stenosis, unchanged. 7. Disc bulge eccentric to the left at L5-S1 resulting in mild right worse than left subarticular zone narrowing without evidence of frank nerve root impingement, and mild-to-moderate left and mild right neural foraminal stenosis, unchanged.  MR THORACIC SPINE W WO CONTRAST Result Date: 02/13/2023 IMPRESSION: 1. Persistent and progressed discitis/osteomyelitis at T11-T12 with worsening endplate destruction and loss of vertebral body height at both levels, worse at T12. 2. Epidural phlegmon is similar to the prior study without discrete abscess. Unchanged moderate spinal canal stenosis and severe bilateral neural foraminal stenosis at T11-T12. 3. Probable associated right-sided septic arthritis at T11-T12, similar to the prior study. No convincing left-sided septic arthritis. 4. No other evidence of infection in the thoracic or lumbar spine. 5. Postsurgical changes reflecting posterior instrumented fusion from L2 through L5 without residual significant spinal canal or neural foraminal stenosis at these levels. 6. 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 7. Disc bulge eccentric to the left at L5-S1 resulting in mild right worse than left subarticular zone narrowing without evidence of frank nerve root impingement, and mild-to-moderate left and mild right neural foraminal stenosis, unchanged.  Results/Tests Pending at Time of Discharge: None  Discharge Medications:  Allergies as of 02/22/2023       Reactions   Atorvastatin Nausea And Vomiting, Other (See Comments)   MYALGIAS   Talwin [pentazocine] Other (See Comments)   headache   Gabapentin Swelling   Other Nausea Only   UNSPECIFIED Anesthesia        Medication List     STOP taking these medications  ciprofloxacin 500 MG tablet Commonly known as: CIPRO    cyclobenzaprine 5 MG tablet Commonly known as: FLEXERIL   doxycycline 100 MG tablet Commonly known as: VIBRA-TABS   DSS 100 MG Caps   Farxiga 5 MG Tabs tablet Generic drug: dapagliflozin propanediol   HYDROcodone-acetaminophen 5-325 MG tablet Commonly known as: NORCO/VICODIN   NovoLOG FlexPen 100 UNIT/ML FlexPen Generic drug: insulin aspart       TAKE these medications    acetaminophen 325 MG tablet Commonly known as: TYLENOL Take 2 tablets (650 mg total) by mouth every 6 (six) hours.   albuterol 108 (90 Base) MCG/ACT inhaler Commonly known as: VENTOLIN HFA Inhale 2 puffs into the lungs every 6 (six) hours as needed for wheezing or shortness of breath.   allopurinol 100 MG tablet Commonly known as: ZYLOPRIM Take 100 mg by mouth at bedtime.   aspirin EC 81 MG tablet Take 81 mg by mouth daily. **presently on hold for surgery**   carvedilol 6.25 MG tablet Commonly known as: COREG Take 1 tablet (6.25 mg total) by mouth 2 (two) times daily. Take 6.25 mg by mouth 2 (two) times daily. / Needs appointment for further refills   ceFEPime  IVPB Commonly known as: MAXIPIME Inject 2 g into the vein every 12 (twelve) hours. Indication:  Discitis/osteomyelitis  First Dose: Yes Last Day of Therapy:  04/03/23 Labs - Once weekly:  CBC/D and BMP, Labs - Every other week:  ESR and CRP Method of administration: IV Push Method of administration may be changed at the discretion of home infusion pharmacist based upon assessment of the patient and/or caregiver's ability to self-administer the medication ordered.   daptomycin  IVPB Commonly known as: CUBICIN Inject 650 mg into the vein daily. Indication:  Discitis/osteomyelitis  First Dose: Yes Last Day of Therapy:  04/03/23 Labs - Once weekly:  CBC/D, BMP, and CPK Labs - Every other week:  ESR and CRP Method of administration: IV Push Method of administration may be changed at the discretion of home infusion pharmacist based upon  assessment of the patient and/or caregiver's ability to self-administer the medication ordered.   DULoxetine 60 MG capsule Commonly known as: CYMBALTA Take 60 mg by mouth at bedtime.   Hair Skin and Nails Formula Tabs Take 1 tablet by mouth daily.   lidocaine 5 % Commonly known as: LIDODERM Place 2 patches onto the skin daily. Remove & Discard patch within 12 hours or as directed by MD Start taking on: Feb 23, 2023   mirtazapine 15 MG tablet Commonly known as: REMERON Take 30 mg by mouth at bedtime.   nitroGLYCERIN 0.4 MG SL tablet Commonly known as: NITROSTAT Place 0.4 mg under the tongue every 5 (five) minutes as needed for chest pain.   oxyCODONE 5 MG immediate release tablet Commonly known as: Oxy IR/ROXICODONE Take 0.5 tablets (2.5 mg total) by mouth every 6 (six) hours as needed for moderate pain.   pantoprazole 40 MG tablet Commonly known as: PROTONIX Take 1 tablet by mouth once daily   pramipexole 1 MG tablet Commonly known as: MIRAPEX Take 1 mg by mouth at bedtime.   pregabalin 75 MG capsule Commonly known as: LYRICA Take 75 mg by mouth at bedtime.   torsemide 20 MG tablet Commonly known as: DEMADEX Take 1 tablet (20 mg total) by mouth 2 (two) times daily.   [name] FlexTouch 200 UNIT/ML FlexTouch Pen Generic drug: insulin degludec Inject 20 Units into the skin at bedtime. What changed: how much to take  Durable Medical Equipment  (From admission, onward)           Start     Ordered   02/19/23 2052  DME Walker rolling  Once       Question:  Patient needs a walker to treat with the following condition  Answer:  S/P lumbar fusion   02/19/23 2051   02/19/23 2052  DME 3 n 1  Once        02/19/23 2051              Discharge Care Instructions  (From admission, onward)           Start     Ordered   02/22/23 0000  Change dressing on IV access line weekly and PRN  (Home infusion instructions - [facility] )         02/22/23 1110            Discharge Instructions: Please refer to Patient Instructions section of EMR for full details.  Patient was counseled important signs and symptoms that should prompt return to medical care, changes in medications, dietary instructions, activity restrictions, and follow up appointments.   Follow-Up Appointments:  Follow-up Information     [facility], [facility] Follow up.   Specialty: Home Health Services Why: The home health agency will contact you for the first home visit Contact information: [address] [place] [address] [phone]         [facility] Follow up.   Why: This is the company supplying your IV antibiotics Contact information: [phone]                Darral Dash, DO 02/22/2023, 5:56 PM PGY-1, [facility] Family Medicine

## 2023-02-22 NOTE — Progress Notes (Signed)
Physical Therapy Treatment Patient Details Name: Katelyn [name] MRN: [id number] DOB: Feb 22, 1956 Today's Date: 02/22/2023   History of Present Illness Patient is a 67 y/o female admitted 02/13/23 with back pain and radiculopathy.  She was found to have discitis/infection. S/P decompressive laminectomy, medial facetectomy T11-12, posterior fixation T10-L2, and removal of spinal cord stimulator system. PMH positive for anxiety, chronic low back pain with multiple lumbar surgeries (most recent in Dec with spinal stimulator), DM2, obesity, OSA, LBBB, HTN, hypercholesterolemia, CKD, CAD.    PT Comments    Pt just had PICC line placed and is to receive blood later today. Despite low hgb pt reports feeling stable with back pain, she does mention R lateral thigh pain that she describes as a stabbing pain. Pt ambulated 150' with upright rollator and min guard A. Worked on bed mobility with keeping precautions. Continue to recommend HHPT at d/c. PT will continue to follow.   Recommendations for follow up therapy are one component of a multi-disciplinary discharge planning process, led by the attending physician.  Recommendations may be updated based on patient status, additional functional criteria and insurance authorization.  Follow Up Recommendations       Assistance Recommended at Discharge Intermittent Supervision/Assistance  Patient can return home with the following Assist for transportation;Help with stairs or ramp for entrance;A little help with walking and/or transfers   Equipment Recommendations  None recommended by PT    Recommendations for Other Services       Precautions / Restrictions Precautions Precautions: Back;Fall Precaution Booklet Issued: No Precaution Comments: pt able to verbalize but needed cues to avoid twisting with bed mobility Required Braces or Orthoses: Spinal Brace Spinal Brace: Thoracolumbosacral orthotic;Applied in sitting position Restrictions Weight Bearing  Restrictions: No     Mobility  Bed Mobility Overal bed mobility: Needs Assistance Bed Mobility: Sit to Sidelying         Sit to sidelying: Min assist General bed mobility comments: pt has been pivoting into bed but is twisting some when she does this. Needs min A to LE's to go down onto R elbow and get LE's into bed before rolling over    Transfers Overall transfer level: Needs assistance Equipment used: Rolling walker (2 wheels) (upright rollator) Transfers: Sit to/from Stand Sit to Stand: Supervision                Ambulation/Gait Ambulation/Gait assistance: Min guard Gait Distance (Feet): 150 Feet Assistive device: Rolling walker (2 wheels) Gait Pattern/deviations: Step-through pattern, Decreased step length - right, Decreased step length - left, Trunk flexed Gait velocity: decreased Gait velocity interpretation: <1.31 ft/sec, indicative of household ambulator   General Gait Details: pt with R thigh pain during ambulation. Used upright rollator because she has this at home and this helps her LE pain.   Stairs             Wheelchair Mobility    Modified Rankin (Stroke Patients Only)       Balance Overall balance assessment: Needs assistance Sitting-balance support: Feet supported Sitting balance-Leahy Scale: Good     Standing balance support: Bilateral upper extremity supported, During functional activity Standing balance-Leahy Scale: Fair Standing balance comment: relies on UE                            Cognition Arousal/Alertness: Awake/alert Behavior During Therapy: WFL for tasks assessed/performed Overall Cognitive Status: Within Functional Limits for tasks assessed  Exercises Other Exercises Other Exercises: posterior pelvic tilt, supine 5x    General Comments General comments (skin integrity, edema, etc.): discussed activity level for home with pt and husband, all  questions answered in regards to mobility      Pertinent Vitals/Pain Pain Assessment Pain Assessment: Faces Faces Pain Scale: Hurts even more Pain Location: R lateral thigh Pain Descriptors / Indicators: Discomfort, Grimacing, Stabbing Pain Intervention(s): Limited activity within patient's tolerance, Monitored during session    Home Living                          Prior Function            PT Goals (current goals can now be found in the care plan section) Acute Rehab PT Goals Patient Stated Goal: return home PT Goal Formulation: With patient/family Time For Goal Achievement: 03/06/23 Potential to Achieve Goals: Good Progress towards PT goals: Progressing toward goals    Frequency    Min 5X/week      PT Plan Current plan remains appropriate    [name]-evaluation              AM-PAC PT "6 Clicks" Mobility   Outcome Measure  Help needed turning from your back to your side while in a flat bed without using bedrails?: A Little Help needed moving from lying on your back to sitting on the side of a flat bed without using bedrails?: A Little Help needed moving to and from a bed to a chair (including a wheelchair)?: A Little Help needed standing up from a chair using your arms (e.g., wheelchair or bedside chair)?: A Little Help needed to walk in hospital room?: A Little Help needed climbing 3-5 steps with a railing? : A Little 6 Click Score: 18    End of Session Equipment Utilized During Treatment: Back brace Activity Tolerance: Patient tolerated treatment well Patient left: in bed;with call bell/phone within reach;with bed alarm set;with family/visitor present Nurse Communication: Mobility status PT Visit Diagnosis: Other abnormalities of gait and mobility (R26.89);Pain Pain - part of body:  (back)     Time: 5621-3086 PT Time Calculation (min) (ACUTE ONLY): 16 min  Charges:  $Gait Training: 8-22 mins                     Katelyn [name], PT  Acute  Rehab Services Secure chat preferred Office [phone]    Katelyn [name] 02/22/2023, 11:30 AM

## 2023-02-22 NOTE — Progress Notes (Signed)
Occupational Therapy Treatment Patient Details Name: Katelyn [name] MRN: [id number] DOB: 17-May-1956 Today's Date: 02/22/2023   History of present illness Patient is a 67 y/o female admitted 02/13/23 with back pain and radiculopathy.  She was found to have discitis/infection. S/P decompressive laminectomy, medial facetectomy T11-12, posterior fixation T10-L2, and removal of spinal cord stimulator system. PMH positive for anxiety, chronic low back pain with multiple lumbar surgeries (most recent in Dec with spinal stimulator), DM2, obesity, OSA, LBBB, HTN, hypercholesterolemia, CKD, CAD.   OT comments  Pt progressing well towards OT goals. Completing ADLs with up to min assist, educated on compensatory techniques for back precautions and will have spouse to assist as needed.  Discussed and demonstrated removing brace from L side to avoid pulling entire brace apart, pt and spouse understand.  Functional mobility in room with min guard using upright rollator.  Pt plans to complete ADLs sitting at this time.  Continue to recommend HHOT services at dc.  Will follow acutely.    Recommendations for follow up therapy are one component of a multi-disciplinary discharge planning process, led by the attending physician.  Recommendations may be updated based on patient status, additional functional criteria and insurance authorization.    Assistance Recommended at Discharge Frequent or constant Supervision/Assistance  Patient can return home with the following  A little help with walking and/or transfers;Assistance with cooking/housework;Assist for transportation;Help with stairs or ramp for entrance;A little help with bathing/dressing/bathroom   Equipment Recommendations  None recommended by OT    Recommendations for Other Services      Precautions / Restrictions Precautions Precautions: Back;Fall Precaution Booklet Issued: Yes (comment) Precaution Comments: provided handout and reviewed with pt Required  Braces or Orthoses: Spinal Brace Spinal Brace: Thoracolumbosacral orthotic;Applied in sitting position Restrictions Weight Bearing Restrictions: No       Mobility Bed Mobility Overal bed mobility: Needs Assistance Bed Mobility: Rolling, Sidelying to Sit Rolling: Modified independent (Device/Increase time) Sidelying to sit: Supervision       General bed mobility comments: supervision for log roll technique but able to roll without assist, supervision for safety as coming to EOB. +rail    Transfers Overall transfer level: Needs assistance Equipment used:  (upright rollator) Transfers: Sit to/from Stand Sit to Stand: Min guard           General transfer comment: to steady, cueing for posture and hand placement     Balance Overall balance assessment: Needs assistance Sitting-balance support: Feet supported Sitting balance-Leahy Scale: Good     Standing balance support: Bilateral upper extremity supported, During functional activity Standing balance-Leahy Scale: Fair Standing balance comment: relies on UE                           ADL either performed or assessed with clinical judgement   ADL Overall ADL's : Needs assistance/impaired     Grooming: Set up;Sitting           Upper Body Dressing : Minimal assistance;Sitting Upper Body Dressing Details (indicate cue type and reason): brace mgmt, educated on only disconnecting L side to increase ease.  Spouse able to assist at dc. Lower Body Dressing: Min guard;Sit to/from stand Lower Body Dressing Details (indicate cue type and reason): pt able to figure 4 to manage socks, would needed assist with compression socks she uses at home but spouse can assist.  sit to stand with min guard. Toilet Transfer: Min guard;Ambulation Toilet Transfer Details (indicate cue type and reason):  using upright rollator Toileting- Clothing Manipulation and Hygiene: Min guard;Sit to/from stand       Functional mobility during  ADLs: Min guard (upright rollator) General ADL Comments: patient progressing well, reviewed compensatory techniques for ADLs.  Spouse present and aware.    Extremity/Trunk Assessment              Vision       Perception     Praxis      Cognition Arousal/Alertness: Awake/alert Behavior During Therapy: WFL for tasks assessed/performed Overall Cognitive Status: Within Functional Limits for tasks assessed                                          Exercises      Shoulder Instructions       General Comments educated on recommendations to complete ADLs in sitting due to imparied balance at this time, pt agreeable.  Spouse plans to assist    Pertinent Vitals/ Pain       Pain Assessment Pain Assessment: Faces Faces Pain Scale: Hurts a little bit Pain Location: incisional back Pain Descriptors / Indicators: Discomfort, Operative site guarding, Grimacing Pain Intervention(s): Limited activity within patient's tolerance, Monitored during session, Repositioned  Home Living                                          Prior Functioning/Environment              Frequency  Min 2X/week        Progress Toward Goals  OT Goals(current goals can now be found in the care plan section)  Progress towards OT goals: Progressing toward goals  Acute Rehab OT Goals Patient Stated Goal: home OT Goal Formulation: With patient Time For Goal Achievement: 03/06/23 Potential to Achieve Goals: Good  Plan Discharge plan remains appropriate;Frequency remains appropriate    Co-evaluation                 AM-PAC OT "6 Clicks" Daily Activity     Outcome Measure   Help from another person eating meals?: None Help from another person taking care of personal grooming?: A Little Help from another person toileting, which includes using toliet, bedpan, or urinal?: A Little Help from another person bathing (including washing, rinsing, drying)?: A  Little Help from another person to put on and taking off regular upper body clothing?: A Little Help from another person to put on and taking off regular lower body clothing?: A Little 6 Click Score: 19    End of Session Equipment Utilized During Treatment: Other (comment);Back brace (upright rollator)  OT Visit Diagnosis: Muscle weakness (generalized) (M62.81);Pain;History of falling (Z91.81);Other abnormalities of gait and mobility (R26.89) Pain - part of body:  (back)   Activity Tolerance Patient tolerated treatment well   Patient Left with call bell/phone within reach;Other (comment);with family/visitor present (sitting EOB)   Nurse Communication Mobility status        Time: 4098-1191 OT Time Calculation (min): 14 min  Charges: OT General Charges $OT Visit: 1 Visit OT Treatments $Self Care/Home Management : 8-22 mins  Barry Brunner, OT Acute Rehabilitation Services Office [phone]   Katelyn [name] 02/22/2023, 10:49 AM

## 2023-02-22 NOTE — Discharge Instructions (Signed)
Dear Katelyn [name],   Thank you for letting us participate in your care! In this section, you will find a brief hospital admission summary of why you were admitted to the hospital, what happened during your admission, your diagnosis/diagnoses, and recommended follow up.  Primary diagnosis: T11/12 Discitis Treatment plan: You received surgical stabilization of your thoracic and lumbar spine and removal of the spinal stimulator. You were also started on IV antibiotics to be continued for the next 6 weeks.  Medication changes: Continue Daptomycin and Cefepime IV daily at home  POST-HOSPITAL & CARE INSTRUCTIONS We recommend following up with your PCP within 1 week from being discharged from the hospital. Please let PCP/Specialists know of any changes in medications that were made which you will be able to see in the medications section of this packet. Please also follow up with Infectious Disease and Neurosurgery  DOCTOR'S APPOINTMENTS & FOLLOW UP Future Appointments  Date Time Provider Department Center  03/15/2023  1:45 PM Kathlynn Grate, DO [facility]  04/09/2023 11:00 AM Flossie Dibble, NP [facility] None     Thank you for choosing [facility]! Take care and be well!  Family Medicine Teaching Service Inpatient Team [place]  [facility]  [address] [place], [place] [address] [phone]

## 2023-02-22 NOTE — Progress Notes (Signed)
PHARMACY CONSULT NOTE FOR:  OUTPATIENT  PARENTERAL ANTIBIOTIC THERAPY (OPAT)  Indication: Discitis/osteomyelitis  Regimen: Daptomycin 650 mg IV every 24 hours + Cefepime  2 gm IV Q 12 hours  End date: 04/03/23  IV antibiotic discharge orders are pended. To discharging provider:  please sign these orders via discharge navigator,  Select New Orders & click on the button choice - Manage This Unsigned Work.     Thank you for allowing pharmacy to be a part of this patient's care.  Sharin Mons, PharmD, BCPS, BCIDP Infectious Diseases Clinical Pharmacist Phone: [phone] 02/22/2023, 9:13 AM

## 2023-02-22 NOTE — TOC Transition Note (Signed)
TOC discharge medications READY and LOCATED in TOC pharmacy on 2nd floor AWAITING pt discharge.  

## 2023-02-22 NOTE — Progress Notes (Signed)
     Daily Progress Note Intern Pager: [phone]  Patient name: Katelyn [name] Medical record number: [id number] Date of birth: [birth year] Age: 67 y.o. Gender: female  Primary Care Provider: Olive Bass, MD Consultants: ID, neurosurgery  Code Status: Full code   Pt Overview and Major Events to Date:  5/7: Admitted to FMTS 5/8: IR aspiration of thoracic spine 5/13: NSGY stabilization of T11/12, SCS removal   Assessment and Plan:  Katelyn [name] is a 67 y.o. 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 Pertinent PMH/PSH includes chronic low back pain with multiple lumbar surgeries, T2DM, obesity, OSA, LBBB, HTN, HLD, CAD, CKD stage IIIb, anxiety.   * Diskitis Recovering well from surgery, pain well controlled. Given suppository last tonight, had large BM. PICC placed today and home abx set up. -NSGY following, appreciate recs -ID following, appreciate recs -Daptomycin and Cefepime per ID -Pain management: Tylenol 1000mg  q6h sch, Oxycodone 5mg  q6h prn -Continue bowel regimen with Miralax and Senna 2 tabs daily -PT/OT following  AKI (acute kidney injury) (HCC) CrCl continues to improve as patient diet advanced post op. Most likely prerenal in the setting of mild dehydration.  Diabetes mellitus without complication (HCC) BGL mostly in 100s. Fasting in 200s -Increase to Semglee 20U -sSSI with CBG checks  Anemia of chronic disease Hgb 6.8, will transfuse 1u pRBC this AM. -Post transfusion H&H  Hypotension-resolved as of 02/22/2023 Resolved, BP normalized.   FEN/GI: Heart healthy/carb modifed PPx: Held 24 hours after surgery per NSGY Dispo: Home pending IV abx and clinical stabilization  Subjective:  Patient assessed at bedside, husband present. States she is feeling and moving well. Would like to get home. Had large BM last night.  Objective: Temp:  [98 F (36.7 C)-98.8 F (37.1 C)] 98.8 F (37.1 C) (05/16 1121) Pulse Rate:  [72-82] 80 (05/16  1121) Resp:  [17-18] 18 (05/16 1121) BP: (103-139)/(36-56) 103/36 (05/16 1121) SpO2:  [94 %-99 %] 99 % (05/16 1121) Physical Exam: General: Sitting up in chair, NAD Cardiovascular: RRR without murmur Respiratory: CTAB. Normal WOB on RA Abdomen: Soft, non-distended, non-tender Extremities: Mild peripheral edema  Laboratory: Most recent CBC Lab Results  Component Value Date   WBC 5.6 02/22/2023   HGB 6.8 (LL) 02/22/2023   HCT 20.9 (L) 02/22/2023   MCV 93.7 02/22/2023   PLT 111 (L) 02/22/2023   Most recent BMP    Latest Ref Rng & Units 02/22/2023    4:42 AM  BMP  Glucose 70 - 99 mg/dL 130   BUN 8 - 23 mg/dL 46   Creatinine 8.65 - 1.00 mg/dL 7.84   Sodium 696 - 295 mmol/L 131   Potassium 3.5 - 5.1 mmol/L 4.9   Chloride 98 - 111 mmol/L 100   CO2 22 - 32 mmol/L 21   Calcium 8.9 - 10.3 mg/dL 8.9     Other pertinent labs: Glu: 191   [name], MD 02/22/2023, 12:38 PM  PGY-1, [facility] FPTS Intern pager: [phone], text pages welcome Secure chat group [facility] Teaching Service

## 2023-02-22 NOTE — Progress Notes (Signed)
Patient refused cpap at this time. Pt not in any distress.

## 2023-02-22 NOTE — Progress Notes (Signed)
Patient ID: Katelyn [name], female   DOB: Sep 16, 1956, 67 y.o.   MRN: [id number] Looks good this am, OOB to chair, moving legs well, some R lateeral thigh pain, some soreness L flank, but overall doing very well

## 2023-02-22 NOTE — Plan of Care (Signed)
  Problem: Education: Goal: Ability to verbalize activity precautions or restrictions will improve Outcome: Progressing Goal: Knowledge of the prescribed therapeutic regimen will improve Outcome: Progressing Goal: Understanding of discharge needs will improve Outcome: Progressing   Problem: Activity: Goal: Ability to avoid complications of mobility impairment will improve Outcome: Progressing Goal: Ability to tolerate increased activity will improve Outcome: Progressing   Problem: Bowel/Gastric: Goal: Gastrointestinal status for postoperative course will improve Outcome: Progressing   Problem: Clinical Measurements: Goal: Ability to maintain clinical measurements within normal limits will improve Outcome: Progressing

## 2023-02-22 NOTE — Progress Notes (Signed)
Peripherally Inserted Central Catheter Placement  The IV Nurse has discussed with the patient and/or persons authorized to consent for the patient, the purpose of this procedure and the potential benefits and risks involved with this procedure.  The benefits include less needle sticks, lab draws from the catheter, and the patient may be discharged home with the catheter. Risks include, but not limited to, infection, bleeding, blood clot (thrombus formation), and puncture of an artery; nerve damage and irregular heartbeat and possibility to perform a PICC exchange if needed/ordered by physician.  Alternatives to this procedure were also discussed.  Bard Power PICC patient education guide, fact sheet on infection prevention and patient information card has been provided to patient /or left at bedside.    PICC Placement Documentation  PICC Single Lumen 02/22/23 Right Brachial 36 cm 0 cm (Active)  Indication for Insertion or Continuance of Line Home intravenous therapies (PICC only) 02/22/23 0901  Exposed Catheter (cm) 0 cm 02/22/23 0901  Site Assessment Clean, Dry, Intact 02/22/23 0901  Line Status Flushed;Saline locked;Blood return noted 02/22/23 0901  Dressing Type Securing device;Transparent 02/22/23 0901  Dressing Status Antimicrobial disc in place 02/22/23 0901  Safety Lock Not Applicable 02/22/23 0901  Line Adjustment (NICU/IV Team Only) No 02/22/23 0901  Dressing Change Due 03/01/23 02/22/23 0901       Romie Jumper 02/22/2023, 9:05 AM

## 2023-02-22 NOTE — Progress Notes (Signed)
PT Cancellation Note  Patient Details Name: Katelyn [name] MRN: [id number] DOB: Feb 21, 1956   Cancelled Treatment:    Reason Eval/Treat Not Completed: Patient at procedure or test/unavailable.   Lyanne Co, PT  Acute Rehab Services Secure chat preferred Office [phone]    Elyse Hsu 02/22/2023, 8:59 AM

## 2023-02-22 NOTE — Progress Notes (Addendum)
PHARMACY NOTE:  ANTIMICROBIAL RENAL DOSAGE ADJUSTMENT  Current antimicrobial regimen includes a mismatch between antimicrobial dosage and estimated renal function.  As per policy approved by the Pharmacy & Therapeutics and Medical Executive Committees, the antimicrobial dosage will be adjusted accordingly.  Current antimicrobial dosage:  Daptomycin 650 mg every 48 hours    Cefepime 2 gm IV q 24 hours   Indication: Discitis/osteomyelitis   Renal Function:  Estimated Creatinine Clearance: 43.4 mL/min (A) (by C-G formula based on SCr of 1.31 mg/dL (H)). []      On intermittent HD, scheduled: []      On CRRT    Antimicrobial dosage has been changed to:  Daptomycin 650 mg every 24 hours  Cefepime 2 gm IV Q 12 hours   Additional comments: OPAT updated as well    Thank you for allowing pharmacy to be a part of this patient's care.  Sharin Mons, PharmD, BCPS, BCIDP Infectious Diseases Clinical Pharmacist Phone: [phone] 02/22/2023 9:10 AM

## 2023-02-23 ENCOUNTER — Telehealth: Payer: Self-pay

## 2023-02-23 LAB — TYPE AND SCREEN
ABO/RH(D): A POS
Antibody Screen: NEGATIVE
Unit division: 0

## 2023-02-23 LAB — BPAM RBC
Blood Product Expiration Date: 202406142359
ISSUE DATE / TIME: 202405161306

## 2023-02-23 NOTE — Telephone Encounter (Signed)
A Prior Authorization was initiated for this patients LIDOCAINE PATCHES through CoverMyMeds.   Key: [id number]

## 2023-02-24 NOTE — Telephone Encounter (Signed)
Prior Auth for patients medication LIDOCAINE PATCHES denied by [facility] [employer] via CoverMyMeds.   Reason:   LIDOCAINE 4% PATCHES ARE AVAILABLE OTC  CoverMyMeds Key: [id number]

## 2023-03-01 NOTE — Telephone Encounter (Signed)
Pt is no longer admitted to the hospital.  Please schedule.

## 2023-03-06 ENCOUNTER — Other Ambulatory Visit (HOSPITAL_COMMUNITY): Payer: Self-pay

## 2023-03-13 ENCOUNTER — Inpatient Hospital Stay (HOSPITAL_COMMUNITY)
Admission: EM | Admit: 2023-03-13 | Discharge: 2023-03-18 | DRG: 682 | Disposition: A | Discharge status: Home Health | Payer: PPO | Attending: Family Medicine | Admitting: Family Medicine

## 2023-03-13 ENCOUNTER — Emergency Department (HOSPITAL_COMMUNITY): Payer: PPO

## 2023-03-13 ENCOUNTER — Telehealth: Payer: Self-pay

## 2023-03-13 ENCOUNTER — Encounter (HOSPITAL_COMMUNITY): Payer: Self-pay | Admitting: Internal Medicine

## 2023-03-13 ENCOUNTER — Other Ambulatory Visit: Payer: Self-pay

## 2023-03-13 DIAGNOSIS — I447 Left bundle-branch block, unspecified: Secondary | ICD-10-CM | POA: Diagnosis present

## 2023-03-13 DIAGNOSIS — R443 Hallucinations, unspecified: Secondary | ICD-10-CM

## 2023-03-13 DIAGNOSIS — M48062 Spinal stenosis, lumbar region with neurogenic claudication: Secondary | ICD-10-CM

## 2023-03-13 DIAGNOSIS — R7989 Other specified abnormal findings of blood chemistry: Secondary | ICD-10-CM | POA: Insufficient documentation

## 2023-03-13 DIAGNOSIS — I132 Hypertensive heart and chronic kidney disease with heart failure and with stage 5 chronic kidney disease, or end stage renal disease: Secondary | ICD-10-CM | POA: Diagnosis present

## 2023-03-13 DIAGNOSIS — D631 Anemia in chronic kidney disease: Secondary | ICD-10-CM | POA: Diagnosis present

## 2023-03-13 DIAGNOSIS — E878 Other disorders of electrolyte and fluid balance, not elsewhere classified: Secondary | ICD-10-CM

## 2023-03-13 DIAGNOSIS — E871 Hypo-osmolality and hyponatremia: Secondary | ICD-10-CM | POA: Diagnosis present

## 2023-03-13 DIAGNOSIS — I959 Hypotension, unspecified: Secondary | ICD-10-CM | POA: Diagnosis present

## 2023-03-13 DIAGNOSIS — N170 Acute kidney failure with tubular necrosis: Secondary | ICD-10-CM | POA: Diagnosis not present

## 2023-03-13 DIAGNOSIS — E669 Obesity, unspecified: Secondary | ICD-10-CM | POA: Diagnosis present

## 2023-03-13 DIAGNOSIS — R4182 Altered mental status, unspecified: Secondary | ICD-10-CM | POA: Diagnosis not present

## 2023-03-13 DIAGNOSIS — G4733 Obstructive sleep apnea (adult) (pediatric): Secondary | ICD-10-CM | POA: Diagnosis present

## 2023-03-13 DIAGNOSIS — G9341 Metabolic encephalopathy: Secondary | ICD-10-CM | POA: Diagnosis present

## 2023-03-13 DIAGNOSIS — Z888 Allergy status to other drugs, medicaments and biological substances status: Secondary | ICD-10-CM

## 2023-03-13 DIAGNOSIS — Z79899 Other long term (current) drug therapy: Secondary | ICD-10-CM

## 2023-03-13 DIAGNOSIS — Z8249 Family history of ischemic heart disease and other diseases of the circulatory system: Secondary | ICD-10-CM

## 2023-03-13 DIAGNOSIS — Z6833 Body mass index (BMI) 33.0-33.9, adult: Secondary | ICD-10-CM

## 2023-03-13 DIAGNOSIS — I2510 Atherosclerotic heart disease of native coronary artery without angina pectoris: Secondary | ICD-10-CM | POA: Diagnosis present

## 2023-03-13 DIAGNOSIS — E860 Dehydration: Secondary | ICD-10-CM | POA: Diagnosis present

## 2023-03-13 DIAGNOSIS — M4644 Discitis, unspecified, thoracic region: Secondary | ICD-10-CM | POA: Diagnosis present

## 2023-03-13 DIAGNOSIS — G8929 Other chronic pain: Secondary | ICD-10-CM | POA: Diagnosis present

## 2023-03-13 DIAGNOSIS — M5416 Radiculopathy, lumbar region: Secondary | ICD-10-CM

## 2023-03-13 DIAGNOSIS — M869 Osteomyelitis, unspecified: Secondary | ICD-10-CM | POA: Diagnosis present

## 2023-03-13 DIAGNOSIS — N179 Acute kidney failure, unspecified: Principal | ICD-10-CM | POA: Diagnosis present

## 2023-03-13 DIAGNOSIS — E1140 Type 2 diabetes mellitus with diabetic neuropathy, unspecified: Secondary | ICD-10-CM | POA: Diagnosis present

## 2023-03-13 DIAGNOSIS — M109 Gout, unspecified: Secondary | ICD-10-CM | POA: Diagnosis present

## 2023-03-13 DIAGNOSIS — E1122 Type 2 diabetes mellitus with diabetic chronic kidney disease: Secondary | ICD-10-CM | POA: Diagnosis present

## 2023-03-13 DIAGNOSIS — E1165 Type 2 diabetes mellitus with hyperglycemia: Secondary | ICD-10-CM | POA: Diagnosis present

## 2023-03-13 DIAGNOSIS — Z794 Long term (current) use of insulin: Secondary | ICD-10-CM

## 2023-03-13 DIAGNOSIS — T361X5A Adverse effect of cephalosporins and other beta-lactam antibiotics, initial encounter: Secondary | ICD-10-CM | POA: Diagnosis present

## 2023-03-13 DIAGNOSIS — K7460 Unspecified cirrhosis of liver: Secondary | ICD-10-CM | POA: Diagnosis present

## 2023-03-13 DIAGNOSIS — E7800 Pure hypercholesterolemia, unspecified: Secondary | ICD-10-CM | POA: Diagnosis present

## 2023-03-13 DIAGNOSIS — Z981 Arthrodesis status: Secondary | ICD-10-CM

## 2023-03-13 DIAGNOSIS — Z7982 Long term (current) use of aspirin: Secondary | ICD-10-CM

## 2023-03-13 DIAGNOSIS — Z66 Do not resuscitate: Secondary | ICD-10-CM | POA: Diagnosis present

## 2023-03-13 DIAGNOSIS — D638 Anemia in other chronic diseases classified elsewhere: Secondary | ICD-10-CM | POA: Diagnosis present

## 2023-03-13 DIAGNOSIS — K219 Gastro-esophageal reflux disease without esophagitis: Secondary | ICD-10-CM | POA: Diagnosis present

## 2023-03-13 DIAGNOSIS — G2581 Restless legs syndrome: Secondary | ICD-10-CM | POA: Diagnosis present

## 2023-03-13 DIAGNOSIS — E1142 Type 2 diabetes mellitus with diabetic polyneuropathy: Secondary | ICD-10-CM | POA: Diagnosis present

## 2023-03-13 DIAGNOSIS — N1832 Chronic kidney disease, stage 3b: Secondary | ICD-10-CM | POA: Diagnosis present

## 2023-03-13 DIAGNOSIS — I5032 Chronic diastolic (congestive) heart failure: Secondary | ICD-10-CM | POA: Diagnosis present

## 2023-03-13 LAB — CBC WITH DIFFERENTIAL/PLATELET
Abs Immature Granulocytes: 0.02 10*3/uL (ref 0.00–0.07)
Basophils Absolute: 0.1 10*3/uL (ref 0.0–0.1)
Basophils Relative: 1 %
Eosinophils Absolute: 0.4 10*3/uL (ref 0.0–0.5)
Eosinophils Relative: 8 %
HCT: 25.7 % — ABNORMAL LOW (ref 36.0–46.0)
Hemoglobin: 8.3 g/dL — ABNORMAL LOW (ref 12.0–15.0)
Immature Granulocytes: 0 %
Lymphocytes Relative: 18 %
Lymphs Abs: 0.9 10*3/uL (ref 0.7–4.0)
MCH: 29 pg (ref 26.0–34.0)
MCHC: 32.3 g/dL (ref 30.0–36.0)
MCV: 89.9 fL (ref 80.0–100.0)
Monocytes Absolute: 0.7 10*3/uL (ref 0.1–1.0)
Monocytes Relative: 13 %
Neutro Abs: 3 10*3/uL (ref 1.7–7.7)
Neutrophils Relative %: 60 %
Platelets: 104 10*3/uL — ABNORMAL LOW (ref 150–400)
RBC: 2.86 MIL/uL — ABNORMAL LOW (ref 3.87–5.11)
RDW: 14.6 % (ref 11.5–15.5)
WBC: 5.1 10*3/uL (ref 4.0–10.5)
nRBC: 0 % (ref 0.0–0.2)

## 2023-03-13 LAB — COMPREHENSIVE METABOLIC PANEL
ALT: 97 U/L — ABNORMAL HIGH (ref 0–44)
AST: 148 U/L — ABNORMAL HIGH (ref 15–41)
Albumin: 2.2 g/dL — ABNORMAL LOW (ref 3.5–5.0)
Alkaline Phosphatase: 271 U/L — ABNORMAL HIGH (ref 38–126)
Anion gap: 14 (ref 5–15)
BUN: 95 mg/dL — ABNORMAL HIGH (ref 8–23)
CO2: 20 mmol/L — ABNORMAL LOW (ref 22–32)
Calcium: 8.4 mg/dL — ABNORMAL LOW (ref 8.9–10.3)
Chloride: 95 mmol/L — ABNORMAL LOW (ref 98–111)
Creatinine, Ser: 4.06 mg/dL — ABNORMAL HIGH (ref 0.44–1.00)
GFR, Estimated: 12 mL/min — ABNORMAL LOW (ref >60)
Glucose, Bld: 184 mg/dL — ABNORMAL HIGH (ref 70–99)
Potassium: 3.9 mmol/L (ref 3.5–5.1)
Sodium: 129 mmol/L — ABNORMAL LOW (ref 135–145)
Total Bilirubin: 0.4 mg/dL (ref 0.3–1.2)
Total Protein: 7.2 g/dL (ref 6.5–8.1)

## 2023-03-13 LAB — URINALYSIS, ROUTINE W REFLEX MICROSCOPIC
Bilirubin Urine: NEGATIVE
Glucose, UA: NEGATIVE mg/dL
Ketones, ur: NEGATIVE mg/dL
Leukocytes,Ua: NEGATIVE
Nitrite: NEGATIVE
Protein, ur: 30 mg/dL — AB
Specific Gravity, Urine: 1.005 (ref 1.005–1.030)
pH: 6 (ref 5.0–8.0)

## 2023-03-13 LAB — CK: Total CK: 498 U/L — ABNORMAL HIGH (ref 38–234)

## 2023-03-13 LAB — LACTIC ACID, PLASMA: Lactic Acid, Venous: 0.8 mmol/L (ref 0.5–1.9)

## 2023-03-13 LAB — MAGNESIUM: Magnesium: 1.7 mg/dL (ref 1.7–2.4)

## 2023-03-13 MED ORDER — SODIUM CHLORIDE 0.9 % IV BOLUS
1000.0000 mL | Freq: Once | INTRAVENOUS | Status: AC
  Administered 2023-03-13: 1000 mL via INTRAVENOUS

## 2023-03-13 MED ORDER — CARVEDILOL 6.25 MG PO TABS
6.2500 mg | ORAL_TABLET | Freq: Two times a day (BID) | ORAL | Status: DC
  Administered 2023-03-14 – 2023-03-18 (×10): 6.25 mg via ORAL
  Filled 2023-03-13 (×3): qty 1
  Filled 2023-03-13: qty 2
  Filled 2023-03-13 (×6): qty 1

## 2023-03-13 MED ORDER — OXYCODONE-ACETAMINOPHEN 5-325 MG PO TABS
1.0000 | ORAL_TABLET | Freq: Once | ORAL | Status: AC
  Administered 2023-03-13: 1 via ORAL
  Filled 2023-03-13: qty 1

## 2023-03-13 MED ORDER — SODIUM CHLORIDE 0.9% FLUSH
10.0000 mL | Freq: Two times a day (BID) | INTRAVENOUS | Status: DC
  Administered 2023-03-13 – 2023-03-15 (×4): 10 mL
  Administered 2023-03-15: 30 mL
  Administered 2023-03-16 – 2023-03-18 (×4): 10 mL

## 2023-03-13 MED ORDER — ONDANSETRON HCL 4 MG/2ML IJ SOLN
4.0000 mg | Freq: Once | INTRAMUSCULAR | Status: AC | PRN
  Administered 2023-03-13: 4 mg via INTRAVENOUS
  Filled 2023-03-13: qty 2

## 2023-03-13 MED ORDER — ACETAMINOPHEN 325 MG PO TABS
650.0000 mg | ORAL_TABLET | Freq: Four times a day (QID) | ORAL | Status: DC | PRN
  Administered 2023-03-14: 650 mg via ORAL
  Filled 2023-03-13: qty 2

## 2023-03-13 MED ORDER — ASPIRIN 81 MG PO TBEC
81.0000 mg | DELAYED_RELEASE_TABLET | Freq: Every day | ORAL | Status: DC
  Administered 2023-03-14 – 2023-03-18 (×5): 81 mg via ORAL
  Filled 2023-03-13 (×5): qty 1

## 2023-03-13 MED ORDER — LACTATED RINGERS IV SOLN: INTRAVENOUS | Status: AC

## 2023-03-13 MED ORDER — INSULIN ASPART 100 UNIT/ML IJ SOLN
0.0000 [IU] | Freq: Three times a day (TID) | INTRAMUSCULAR | Status: DC
  Administered 2023-03-14 (×2): 3 [IU] via SUBCUTANEOUS
  Administered 2023-03-15 (×2): 2 [IU] via SUBCUTANEOUS
  Administered 2023-03-15: 5 [IU] via SUBCUTANEOUS
  Administered 2023-03-16: 8 [IU] via SUBCUTANEOUS
  Administered 2023-03-16: 5 [IU] via SUBCUTANEOUS
  Administered 2023-03-16 – 2023-03-17 (×2): 2 [IU] via SUBCUTANEOUS
  Administered 2023-03-17: 3 [IU] via SUBCUTANEOUS
  Administered 2023-03-17: 5 [IU] via SUBCUTANEOUS
  Administered 2023-03-18: 3 [IU] via SUBCUTANEOUS
  Administered 2023-03-18: 5 [IU] via SUBCUTANEOUS

## 2023-03-13 MED ORDER — CHLORHEXIDINE GLUCONATE CLOTH 2 % EX PADS
6.0000 | MEDICATED_PAD | Freq: Every day | CUTANEOUS | Status: DC
  Administered 2023-03-14 – 2023-03-18 (×5): 6 via TOPICAL

## 2023-03-13 MED ORDER — SODIUM CHLORIDE 0.9% FLUSH
10.0000 mL | INTRAVENOUS | Status: DC | PRN
  Administered 2023-03-13: 10 mL

## 2023-03-13 NOTE — Assessment & Plan Note (Addendum)
Per patient peripheral edema is chronic, reassuringly no other signs of volume overload. Continued caution with fluids. If drinking appropriately today will discontinue. -Hold Lasix in setting of renal failure -Caution with maintenance IV fluids --Continue Coreg

## 2023-03-13 NOTE — ED Provider Triage Note (Signed)
Emergency Medicine Provider Triage Evaluation Note  Katelyn [name] , a 67 y.o. female  was evaluated in triage.  Pt complains of hallucinations.  Recently started on home IV antibiotics for spinal epidural abscess.  She is concerned regarding a UTI.  She does not have any urinary symptoms, or other complaints to point towards an infection.  She denies shortness of breath, cough.  She does endorse some nausea with an episode of emesis last night but no abdominal pain.  Patient recently discharged on 5/16.  Vital signs are stable.  Patient is alert and oriented.  Not responding to internal stimuli.  Review of Systems  Positive: As above Negative: As above  Physical Exam  BP 121/68   Pulse 79   Temp 98.6 F (37 C) (Oral)   Resp 16   SpO2 97%  Gen:   Awake, no distress   Resp:  Normal effort  MSK:   Moves extremities without difficulty  Other:    Medical Decision Making  Medically screening exam initiated at 12:06 PM.  Appropriate orders placed.  Katelyn [name] was informed that the remainder of the evaluation will be completed by another provider, this initial triage assessment does not replace that evaluation, and the importance of remaining in the ED until their evaluation is complete.     Marita Kansas, PA-C 03/13/23 1208

## 2023-03-13 NOTE — Assessment & Plan Note (Addendum)
Likely multifactorial in the setting of daptomycin causing myoglobinuria, acute dehydration in the setting of AMS, and possible progression of chronic renal disease. Continue to monitor while on mIVF. -Foley for strict I/O --Follow-up ID recs --Continue mIVF -CMP AM -Consider Nephrology consult in if not improving

## 2023-03-13 NOTE — Assessment & Plan Note (Addendum)
Hyponatremic to 129, appears she has been intermittently hyponatremic since 11/2022. Hypochloremic to 95 which was present at last admission. Consider worsening in setting of decreased p.o. intake. -Renal/carb modified diet as tolerated -AM CMP, urine sodium, urine osmolality, serum osmolality

## 2023-03-13 NOTE — Progress Notes (Signed)
FAMILY MEDICINE TEACHING SERVICE Patient -   Family Medicine will be seeing accepting this patient. Patient seen in the ED and noted to be stable. Was informed that night team would be by to see her and do the admission.  Please contact intern pager [phone] or text page via website [email or web address] ([email or web address]) for questions regarding care. DO NOT page listed attending provider unless there is no answer from the number above.

## 2023-03-13 NOTE — Assessment & Plan Note (Addendum)
Will rule out hepatitis. Likely elevated in the setting of renal injury. -A.m. CMP --Hepatitis panel

## 2023-03-13 NOTE — H&P (Cosign Needed Addendum)
Hospital Admission History and Physical Service Pager: [phone]  Patient name: Katelyn [name] Medical record number: [id number] Date of Birth: 1956/01/20 Age: 67 y.o. Gender: female  Primary Care Provider: Olive Bass, MD Consultants: ID Code Status: DNR/DNI Preferred Emergency Contact:  Contact Information     Name Relation Home Work Mobile   Fehring,Jerry Spouse   [phone]      Chief Complaint: Altered mental status, poor p.o. intake  Assessment and Plan: Katelyn [name] is a 67 y.o. female presenting with AMS. Differential for this patient's presentation of this includes metabolic insult (most likely given given evidence of uremia in the setting of acute renal failure questionably precipitated by rhabdomyolysis secondary to daptomycin administration), medications (more likely given patient is on chronic opioids, Remeron, Lyrica, and cefepime; while she has been taking these medications for multiple weeks, consider decreased clearance with acute renal failure), hypercarbia (less likely given normal respiratory effort on exam though she could have some element of chronic hypercarbia due to OSA), intracranial process (less likely given reassuring neurological exam and CT head), stroke (less likely given overall reassuring neurological exam and CT head though she certainly has risk factors for this), ACS/arrhythmia (less likely given lack of chest pain and EKG unchanged from prior).  * AMS (altered mental status) Neurological exam overall reassuring though patient is tangential with labile mood. BUN 95 which could be contributing.  Degree of renal failure could allow for increased concentrations of her CNS medications, resulting in altered mental status. Recent use of cefepime (and its accumulation due to renal dysfunction) may also be contributing. Spoke with ID who felt we could hold both antibiotics for osteomyelitis and they can reassess in the morning.  Given persistent symptoms and need  for further management, will admit to FMTS attending Dr. Manson Passey. -S/p 1L bolus in ED, mIVF as below for renal hydration/urea clearance -Hold cefepime and daptomycin for osteomyelitis, appreciate ID assistance -Follow-up ID recommendations in a.m. -Neurochecks every 4 hours -Delirium precautions -Fall precautions -A.m. CMP  Elevated LFTs LFTs elevated to AST 148, ALT 97, alk phos 271  Of note, alk phos and LFTs were also elevated, though to a more mild degree, in February 2024.  She does have a history of cirrhosis (no heavy alcohol use per report) as evidenced by CTAP which could explain these findings, though acute worsening could be secondary to hypovolemia. -A.m. CMP -Maintenance IV fluids as able  Electrolyte imbalance Hyponatremic to 129, appears she has been intermittently hyponatremic since 11/2022. Hypochloremic to 95 which was present at last admission. Consider worsening in setting of decreased p.o. intake. -Renal/carb modified diet as tolerated -AM CMP, urine sodium, urine osmolality, serum osmolality  Acute renal failure (ARF) (HCC) Creatinine 4.06 and GFR 12 up from previous creatinine 1.31 and GFR 45 on 02/22/2023.  UA with moderate hemoglobin, protein 30, bacteria few.  Less concern for UTI given no suprapubic tenderness. Has history of CKD stage IIIb. Recent history of decreased p.o. intake which could be contributing. Recent renal ultrasound and postvoid volumes for elevated creatinine to 2.2 over the last admission were normal. Has been on daptomycin for osteomyelitis which can lead to rhabdomyolysis, though degree of CK elevation to 400s would be unlikely to cause this degree of renal dysfunction.  No evidence of obstruction on CTAP.  Less likely infectious or immune given no fever, rash, or other suggestive findings.  Less likely cardiorenal given only 1+ pitting edema of bilateral lower extremities to the ankles. -Bladder scans  to ensure she is not retaining though doubt given  negative CTAP -If not retaining, add maintenance LR IV fluids -Strict I/O's -CMP AM  Osteomyelitis (HCC) Diagnosed at last admission, discharged in May 2024 on IV daptomycin and cefepime through PICC.  She has been taking her antibiotics as prescribed. Last admission had vertebral aspiration with IR without organism growth.  Neurosurgery performed spinal stabilization stimulator removal at that time.  Per discussion with ID, will hold antibiotics for now until further evaluation in the a.m. -Follow-up ID recommendations, appreciate assistance -S/p one dose Norco in ED, will hold further doses given concern for AMS though revisit adding on given risk of withdrawal in AM, add on COWS -Add on Tylenol 650 mg every 6 as needed for pain  Anemia of chronic disease Hemoglobin 8.3, up from 8.1 on 02/22/2023.  Required transfusion at last admission for hemoglobin 6.8. -AM CBC  Type 2 diabetes mellitus with diabetic neuropathy, unspecified (HCC) Discharged in May 2024 on 20 units long-acting insulin with aspart scale with meals.  Patient has not been taking in normal amounts of p.o. at home. A1c in 11/2022 7.2. -Hold home long-acting insulin given decreased p.o. -Start SSI and adjust long-acting insulin as appropriate -CBGs 3 times daily with meals and at night -AM A1c  Chronic diastolic CHF (congestive heart failure) (HCC) Last echo 01/2023 with mild LVH, LVEF 40 to 45%, elevated LA and LV end-diastolic pressures, mild diffuse hypokinesia, mild TVR, pulmonary artery systolic pressure moderate to severely dilated at 62 mg of mercury.  Appears somewhat volume overloaded today with 1+ pitting edema to bilateral lower extremities, no crackles appreciated on pulmonary exam.  Will need to be cautious about volume administration for above renal dysfunction. -Hold Lasix in setting of renal failure -Caution with maintenance IV fluids  Chronic and stable: CAD (continue home ASA), OSA (not on BiPAP anymore after  titration study), HTN (continue home Coreg, hold home Lasix in setting of renal failure), HLD (does not appear to be taking rosuvastatin as discussed at last admission but consider adding this back once renal impairment improves)  FEN/GI: Renal/carb modified diet with assistance with feeds VTE Prophylaxis: SCDs  Disposition: Progressive  History of Present Illness:  Katelyn [name] is a 67 y.o. female presenting with altered mental status and poor p.o. intake.  Level V Caveat-speaking very tangential speech  Unable to logically explain sequence of events and symptoms  Alanie Goossen, husband called after interview and provided collateral: She began getting worse 3-4 days ago Has had feet and legs swelling again and hands tighter Fingertips and feet burning like fire Yesterday and today has been talking to people that aren't there Was standing in doorway of bedroom and started talking to people behind her Says she was confused about this Has been seeing things moving on floor Thinks she is going down same road her aunt did who had nervous breakdown and is anxious about this Has been on cefepime and daptomycin (previously on vanc but changed due to renal dysfunction) for osteomyelitis, took all doses but one this morning after calling ID and them recommending she hold meds No fevers that he knows of Not been eating well in past few days-had 1-2 episodes of vomiting-NBNB Not sleeping well since back surgery Not been peeing very much at all even though taking 2 fluid pills Has been pooping pretty regularly-takes stool softener every day  In the ED, patient found to be hypertensive to 168/67 with overall normal exam other than labile and  tearful affect.  Normocytic anemia with hemoglobin 8.3 and platelets 104.  No leukocytosis.  Sodium 129, glucose 184, BUN 95, creatinine 4.06, GFR 12, AST 148, ALT 97, alk phos 271.  Lactate normal.  EKG with sinus rhythm.  CT head without acute abnormality.  CTAP with  moderate colonic stool load, cirrhosis, endplate destruction at T11/12 consistent with discitis/osteomyelitis.  Given worsening symptoms, she was called for admission.  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   Pertinent Past Surgical History: Hysterectomy Appendectomy Cardiac cath Cholecystectomy Cystoscopy for kidney extraction Excision/release of bursa of hip Bilateral cataract surgery Laminectomy with posterior lateral arthrodesis Lumbar laminectomy/decompression with microdiscectomy Right shoulder surgery Spinal cord stimulator insertion and removal Remainder reviewed in history tab.   Pertinent Social History: Tobacco use: Never Alcohol use: Occasional-none in several years Other Substance use: No  Walks with rollator Lives with husband  Pertinent Family History: No family hx of dementia or mood disorders Sister alcoholic and opioid addiction Remainder reviewed in history tab.   Important Outpatient Medications: Daptomycin and cefepime for osteomyelitis, last doses 04/03/23-has not had any today Allopurinol 100 mg at bedtime Aspirin 81 mg daily Norco 5-325 mg every 6 as needed-twice a day Coreg 6.25 mg twice daily Torsemide 40 mg once daily in AM Cymbalta 60 mg nightly Remeron 30 mg nightly Aspart 10-12 units twice daily with meals Tresiba 20 units nightly-does not take it unless 200 Protonix 40 mg daily Mirapex 1 mg  nightly Lyrica 75 mg nightly Hair skin and nails vitamin daily Remainder reviewed in medication history.   Objective: BP (!) 165/76   Pulse 93   Temp 98 F (36.7 C)   Resp 19   SpO2 99%  Exam: General: Alert and oriented to person, place, situation, year, place, but not to president, in NAD Skin: Warm, dry, and intact without lesions HEENT: NCAT, EOM grossly normal, midline nasal septum Cardiac: RRR, 2/6 questionably systolic murmur appreciated Respiratory: CTAB, breathing and speaking comfortably on RA Abdominal/Back: Soft, mildly tender to palpation of epigastric area and left upper quadrant, nondistended, normoactive bowel sounds, well-healing postsurgical scar of the midline on back Extremities: Moves all extremities grossly equally in bed, warm and well-perfused Neurological: Cranial nerves II through XII intact, strength and sensation intact throughout Psychiatric: Anxious and depressed mood, intermittently tearful  Labs:  CBC BMET  Recent Labs  Lab 03/13/23 1207  WBC 5.1  HGB 8.3*  HCT 25.7*  PLT 104*   Recent Labs  Lab 03/13/23 1207  NA 129*  K 3.9  CL 95*  CO2 20*  BUN 95*  CREATININE 4.06*  GLUCOSE 184*  CALCIUM 8.4*     Lactic acid 0.8  EKG: Sinus rhythm, normal rate, LBBB, no STEMI identified  Imaging Studies Performed:  CT head IMPRESSION: No acute intracranial abnormality.  CTAP IMPRESSION: 1. No acute abnormality identified in the abdomen or pelvis. 2. Moderate colonic stool load. 3. Cirrhosis. 4. Similar endplate destruction at T11-12 consistent with discitis-osteomyelitis  Janeal Holmes, MD 03/13/2023, 10:57 PM PGY-1, [facility] FPTS Intern pager: [phone], text  pages welcome Secure chat group [facility] Teaching Service

## 2023-03-13 NOTE — Telephone Encounter (Signed)
Received call from patient's husband, Dorene Sorrow. He is concerned she's having a reaction to her cefepime or daptomycin. He reports that her legs and feet have started to swell back up. He is mostly concerned that for the last 2-3 days she has been talking when no one is present and has been seeing things that aren't there.   She has been on these antibiotics for weeks and these symptoms just started within the last three days. She has an appointment with her primary care doctor this afternoon, encouraged Dorene Sorrow to keep that appointment as this could be unrelated to her infection/antibiotics. He says she's never had any symptoms like this before and has no history of hallucinations. Dorene Sorrow reports she has not complained of worsening pain, fever, or chills. He is holding her antibiotics until he hears back from our office.   Sandie Ano, RN

## 2023-03-13 NOTE — Assessment & Plan Note (Addendum)
Neurological exam overall reassuring though patient is tangential with labile mood. BUN 95 which could be contributing.  Degree of renal failure could allow for increased concentrations of her CNS medications, resulting in altered mental status. Recent use of cefepime (and its accumulation due to renal dysfunction) may also be contributing. Spoke with ID who felt we could hold both antibiotics for osteomyelitis and they can reassess in the morning.  Given persistent symptoms and need for further management, will admit to FMTS attending Dr. Manson Passey. -S/p 1L bolus in ED, mIVF as below for renal hydration/urea clearance -Hold cefepime and daptomycin for osteomyelitis, appreciate ID assistance -Follow-up ID recommendations in a.m. -Neurochecks every 4 hours -Delirium precautions -Fall precautions -A.m. CMP

## 2023-03-13 NOTE — Assessment & Plan Note (Addendum)
CBG 78->77.  A1c in 11/2022 7.2. -Hold home long-acting insulin given decreased p.o. -SSI and adjust long-acting insulin as appropriate -CBGs 3 times daily with meals and at night -A1c f/u 

## 2023-03-13 NOTE — Progress Notes (Signed)
FMTS Brief Progress Note  Spoke with on-call ID physician Dr. Drue Second.  Discussed case with her and patient's renal dysfunction and mental status changes.  She recommended holding both daptomycin and cefepime today while we figure out renal dysfunction and consulting ID in morning.  She has enough of the antibiotic in her system due to her renal dysfunction so she is okay for today.   She discussed possibility of cefepime induced altered mental status. She also discussed with me that daptomycin induced rhabdomyolysis would have produced a much larger CK around 10 times the upper limit so this is less likely  Katelyn Erp, MD 03/13/2023, 10:06 PM PGY-2, [facility] Night Resident  Please page [phone] with questions.

## 2023-03-13 NOTE — Assessment & Plan Note (Addendum)
Hemoglobin 8.3, up from 8.1 on 02/22/2023.  Required transfusion at last admission for hemoglobin 6.8. -AM CBC

## 2023-03-13 NOTE — Assessment & Plan Note (Addendum)
Follow-up ID recommendations. -Follow-up ID recommendations, appreciate assistance -Add on Tylenol 650 mg every 6 as needed for pain

## 2023-03-13 NOTE — Telephone Encounter (Signed)
After review of recent labs, both Dr. Earlene Plater and pharmacy team agree that evaluation in the emergency department would best.   Katelyn [name] back and relayed this recommendation. He will take her to either [facility] or [facility] today.   Sandie Ano, RN

## 2023-03-13 NOTE — Telephone Encounter (Signed)
Spoke with Katelyn [name], relayed Dr. Philis Pique message that these symptoms seem unrelated to her back infection. Asked him to discuss these symptoms with her PCP at their appointment this afternoon and discussed possibility of UTI. He mentions that she has also been having hand tremors and has been dropping things lately. Encouraged him to discuss further with PCP.   Reviewed patient's recent labs, CK has gone up to 1670 and creatinine 2.16. Asked Katelyn [name] to continue holding antibiotics until provider and pharmacy team can review her lab work.   Sandie Ano, RN

## 2023-03-13 NOTE — ED Triage Notes (Signed)
Patient being treated at home with IV abx for bladder infection. Husband now notices that she is talking to people who aren't in the room and being forgetful and having nausea.

## 2023-03-13 NOTE — ED Provider Notes (Signed)
[place] EMERGENCY DEPARTMENT AT [facility] Provider Note   CSN: [id number] Arrival date & time: 03/13/23  1128     History  Chief Complaint  Patient presents with   Altered Mental Status   Nausea    ALDANA HAJJAR is a 67 y.o. female.   Altered Mental Status Associated symptoms: hallucinations and nausea   Patient presents for hallucinations.  Medical history includes CKD, esophageal stricture, CAD, CHF, HTN, OSA, gout, GERD, neuropathy, T2DM, HLD.  She was hospitalized 1 month ago for discitis with failure of outpatient antibiotics.  She underwent vertebral aspiration with IR and spinal stabilization and nerve stimulator removal by neurosurgery.  Infectious disease recommended 6 weeks of daptomycin and cefepime.  Patient has been adherent to her antibiotic regimen.  She has alternated between oxycodone and hydrocodone for management of her ongoing chronic back pain.  She states that she does not take anything else for chronic pain.  Over the past 3 days, patient has had intermittent episodes of hallucinations.  Although these are difficult for her to describe, it does seem that she will visually hallucinate other people.  This morning, husband woke up with her talking to internal stimuli.  Patient and husband endorse labile emotions over the past several days.  Patient reports poor p.o. intake secondary to nausea and poor appetite.  She has had poor sleep.     Home Medications Prior to Admission medications   Medication Sig Start Date End Date Taking? Authorizing Provider  acetaminophen (TYLENOL) 325 MG tablet Take 2 tablets (650 mg total) by mouth every 6 (six) hours. 02/22/23   [name], MD  albuterol (VENTOLIN HFA) 108 (90 Base) MCG/ACT inhaler Inhale 2 puffs into the lungs every 6 (six) hours as needed for wheezing or shortness of breath. Patient not taking: Reported on 02/14/2023 02/05/23   Noemi Chapel, NP  allopurinol (ZYLOPRIM) 100 MG tablet Take 100 mg by  mouth at bedtime. 10/20/19   [provider]  aspirin EC 81 MG tablet Take 81 mg by mouth daily. **presently on hold for surgery**    [provider]  carvedilol (COREG) 6.25 MG tablet Take 1 tablet (6.25 mg total) by mouth 2 (two) times daily. Take 6.25 mg by mouth 2 (two) times daily. / Needs appointment for further refills 04/27/21   Baldo Daub, MD  ceFEPime (MAXIPIME) IVPB Inject 2 g into the vein every 12 (twelve) hours. Indication:  Discitis/osteomyelitis  First Dose: Yes Last Day of Therapy:  04/03/23 Labs - Once weekly:  CBC/D and BMP, Labs - Every other week:  ESR and CRP Method of administration: IV Push Method of administration may be changed at the discretion of home infusion pharmacist based upon assessment of the patient and/or caregiver's ability to self-administer the medication ordered. 02/22/23 04/03/23  Dameron, Nolberto Hanlon, DO  daptomycin (CUBICIN) IVPB Inject 650 mg into the vein daily. Indication:  Discitis/osteomyelitis  First Dose: Yes Last Day of Therapy:  04/03/23 Labs - Once weekly:  CBC/D, BMP, and CPK Labs - Every other week:  ESR and CRP Method of administration: IV Push Method of administration may be changed at the discretion of home infusion pharmacist based upon assessment of the patient and/or caregiver's ability to self-administer the medication ordered. 02/22/23 04/03/23  Dameron, Nolberto Hanlon, DO  DULoxetine (CYMBALTA) 60 MG capsule Take 60 mg by mouth at bedtime.     [provider]  lidocaine (LIDODERM) 5 % Place 2 patches onto the skin daily. Remove &  Discard patch within 12 hours or as directed by MD 02/23/23   Dameron, Nolberto Hanlon, DO  mirtazapine (REMERON) 15 MG tablet Take 30 mg by mouth at bedtime.    [provider]  Multiple Vitamins-Minerals (HAIR SKIN AND NAILS FORMULA) TABS Take 1 tablet by mouth daily. 10/17/18   [provider]  nitroGLYCERIN (NITROSTAT) 0.4 MG SL tablet Place 0.4 mg under the tongue every 5 (five)  minutes as needed for chest pain.     [provider]  oxyCODONE (OXY IR/ROXICODONE) 5 MG immediate release tablet Take 0.5 tablets (2.5 mg total) by mouth every 6 (six) hours as needed for moderate pain. 02/22/23   [name], MD  pantoprazole (PROTONIX) 40 MG tablet Take 1 tablet by mouth once daily 07/31/22   Baldo Daub, MD  pramipexole (MIRAPEX) 1 MG tablet Take 1 mg by mouth at bedtime.    [provider]  pregabalin (LYRICA) 75 MG capsule Take 75 mg by mouth at bedtime. 09/24/21   [provider]  torsemide (DEMADEX) 20 MG tablet Take 1 tablet (20 mg total) by mouth 2 (two) times daily. 01/08/23   Baldo Daub, MD  TRESIBA FLEXTOUCH 200 UNIT/ML FlexTouch Pen Inject 20 Units into the skin at bedtime. 02/22/23   Dameron, Nolberto Hanlon, DO      Allergies    Atorvastatin, Talwin [pentazocine], Gabapentin, and Other    Review of Systems   Review of Systems  Constitutional:  Positive for appetite change.  Gastrointestinal:  Positive for nausea.  Psychiatric/Behavioral:  Positive for hallucinations and sleep disturbance.   All other systems reviewed and are negative.   Physical Exam Updated Vital Signs BP (!) 168/67   Pulse 77   Temp 98.6 F (37 C) (Oral)   Resp 18   SpO2 100%  Physical Exam Vitals and nursing note reviewed.  Constitutional:      General: She is not in acute distress.    Appearance: Normal appearance. She is well-developed. She is not ill-appearing, toxic-appearing or diaphoretic.  HENT:     Head: Normocephalic and atraumatic.     Right Ear: External ear normal.     Left Ear: External ear normal.     Nose: Nose normal.     Mouth/Throat:     Mouth: Mucous membranes are moist.  Eyes:     Extraocular Movements: Extraocular movements intact.     Conjunctiva/sclera: Conjunctivae normal.  Cardiovascular:     Rate and Rhythm: Normal rate and regular rhythm.  Pulmonary:     Effort: Pulmonary effort is normal. No respiratory distress.   Abdominal:     General: There is no distension.     Palpations: Abdomen is soft.  Musculoskeletal:        General: No swelling or deformity. Normal range of motion.     Cervical back: Normal range of motion and neck supple.  Skin:    General: Skin is warm and dry.     Capillary Refill: Capillary refill takes less than 2 seconds.     Coloration: Skin is not jaundiced or pale.  Neurological:     General: No focal deficit present.     Mental Status: She is alert and oriented to person, place, and time.     Cranial Nerves: No cranial nerve deficit.     Sensory: No sensory deficit.     Motor: No weakness.     Coordination: Coordination normal.  Psychiatric:        Attention and Perception: She  perceives auditory and visual hallucinations.        Mood and Affect: Mood normal. Affect is labile and tearful.        Speech: Speech normal.        Behavior: Behavior normal. Behavior is cooperative.        Thought Content: Thought content normal.     ED Results / Procedures / Treatments   Labs (all labs ordered are listed, but only abnormal results are displayed) Labs Reviewed  COMPREHENSIVE METABOLIC PANEL - Abnormal; Notable for the following components:      Result Value   Sodium 129 (*)    Chloride 95 (*)    CO2 20 (*)    Glucose, Bld 184 (*)    BUN 95 (*)    Creatinine, Ser 4.06 (*)    Calcium 8.4 (*)    Albumin 2.2 (*)    AST 148 (*)    ALT 97 (*)    Alkaline Phosphatase 271 (*)    GFR, Estimated 12 (*)    All other components within normal limits  CBC WITH DIFFERENTIAL/PLATELET - Abnormal; Notable for the following components:   RBC 2.86 (*)    Hemoglobin 8.3 (*)    HCT 25.7 (*)    Platelets 104 (*)    All other components within normal limits  LACTIC ACID, PLASMA  LACTIC ACID, PLASMA  URINALYSIS, ROUTINE W REFLEX MICROSCOPIC  MAGNESIUM  CK    EKG None  Radiology No results found.  Procedures Procedures    Medications Ordered in ED Medications  sodium  chloride flush (NS) 0.9 % injection 10-40 mL (has no administration in time range)  sodium chloride flush (NS) 0.9 % injection 10-40 mL (has no administration in time range)  Chlorhexidine Gluconate Cloth 2 % PADS 6 each (has no administration in time range)  ondansetron (ZOFRAN) injection 4 mg (4 mg Intravenous Given 03/13/23 1206)  sodium chloride 0.9 % bolus 1,000 mL (1,000 mLs Intravenous New Bag/Given 03/13/23 1733)    ED Course/ Medical Decision Making/ A&P                             Medical Decision Making Amount and/or Complexity of Data Reviewed Labs: ordered. Radiology: ordered.  Risk OTC drugs. Prescription drug management. Decision regarding hospitalization.   This patient presents to the ED for concern of hallucinations, this involves an extensive number of treatment options, and is a complaint that carries with it a high risk of complications and morbidity.  The differential diagnosis includes polypharmacy, metabolic derangements, new onset psychosis, neurocognitive disorder   Co morbidities that complicate the patient evaluation  CKD, esophageal stricture, CAD, CHF, HTN, OSA, gout, GERD, neuropathy, T2DM, HLD   Additional history obtained:  Additional history obtained from patient's husband External records from outside source obtained and reviewed including EMR   Lab Tests:  I Ordered, and personally interpreted labs.  The pertinent results include: Acute renal failure with elevated BUN, likely contributing to recent symptoms.  Transaminases are modestly elevated.  Hyponatremia and hypochloremia are present.  Anemia is baseline.  No leukocytosis is present.  Lactate is normal.   Imaging Studies ordered:  I ordered imaging studies including CT of head, abdomen, pelvis I independently visualized and interpreted imaging which showed no acute findings I agree with the radiologist interpretation   Cardiac Monitoring: / EKG:  The patient was maintained on a  cardiac monitor.  I personally viewed and interpreted  the cardiac monitored which showed an underlying rhythm of: Sinus rhythm   Problem List / ED Course / Critical interventions / Medication management  Patient presents for hallucinations and emotional lability over the last 3 days.  She also reports poor p.o. intake due to poor appetite and nausea.  She had a recent hospitalization for discitis.  She has been on cefepime and daptomycin at home.  She has a PICC line in place.  On arrival in the ED, patient is anxious and tearful.  History is provided by both patient and husband, who accompanies her at bedside.  Vital signs are notable for hypertension.  Patient is alert and oriented at this time.  She has no focal neurologic deficits.  On review of her home medications, she is taking only hydrocodone at the moment for control of her chronic back pain.  She denies any uses of other pain medications.  Per chart review, she is also prescribed Remeron and Cymbalta but these have been long-term medications.  Laboratory workup was initiated.  Patient is found to be in acute renal failure.  Her BUN is elevated and is likely contributing to her recent symptoms.  Suspected cause of this is possible rhabdomyolysis from her daptomycin.  CK is pending at time of admission.  On CT imaging, it does not appear to be obstructive uropathy.  IV fluids were started while in the ED.  Patient was admitted for further management. I ordered medication including IV fluids for hydration Reevaluation of the patient after these medicines showed that the patient improved I have reviewed the patients home medicines and have made adjustments as needed   Social Determinants of Health:  Lives at home with husband        Final Clinical Impression(s) / ED Diagnoses Final diagnoses:  Acute renal failure, unspecified acute renal failure type (HCC)  Azotemia  Hallucinations    Rx / DC Orders ED Discharge Orders     None          Gloris Manchester, MD 03/13/23 1930

## 2023-03-14 ENCOUNTER — Other Ambulatory Visit (HOSPITAL_COMMUNITY): Payer: Self-pay

## 2023-03-14 ENCOUNTER — Other Ambulatory Visit: Payer: Self-pay

## 2023-03-14 ENCOUNTER — Inpatient Hospital Stay (HOSPITAL_COMMUNITY): Payer: PPO

## 2023-03-14 DIAGNOSIS — G9341 Metabolic encephalopathy: Secondary | ICD-10-CM | POA: Diagnosis present

## 2023-03-14 DIAGNOSIS — N170 Acute kidney failure with tubular necrosis: Secondary | ICD-10-CM | POA: Diagnosis present

## 2023-03-14 DIAGNOSIS — E1140 Type 2 diabetes mellitus with diabetic neuropathy, unspecified: Secondary | ICD-10-CM | POA: Diagnosis not present

## 2023-03-14 DIAGNOSIS — R569 Unspecified convulsions: Secondary | ICD-10-CM | POA: Diagnosis not present

## 2023-03-14 DIAGNOSIS — N179 Acute kidney failure, unspecified: Secondary | ICD-10-CM | POA: Diagnosis not present

## 2023-03-14 DIAGNOSIS — I132 Hypertensive heart and chronic kidney disease with heart failure and with stage 5 chronic kidney disease, or end stage renal disease: Secondary | ICD-10-CM | POA: Diagnosis present

## 2023-03-14 DIAGNOSIS — E1165 Type 2 diabetes mellitus with hyperglycemia: Secondary | ICD-10-CM | POA: Diagnosis present

## 2023-03-14 DIAGNOSIS — E1169 Type 2 diabetes mellitus with other specified complication: Secondary | ICD-10-CM | POA: Diagnosis not present

## 2023-03-14 DIAGNOSIS — Z794 Long term (current) use of insulin: Secondary | ICD-10-CM

## 2023-03-14 DIAGNOSIS — I959 Hypotension, unspecified: Secondary | ICD-10-CM | POA: Diagnosis present

## 2023-03-14 DIAGNOSIS — I5032 Chronic diastolic (congestive) heart failure: Secondary | ICD-10-CM | POA: Diagnosis not present

## 2023-03-14 DIAGNOSIS — M869 Osteomyelitis, unspecified: Secondary | ICD-10-CM | POA: Diagnosis not present

## 2023-03-14 DIAGNOSIS — R4182 Altered mental status, unspecified: Secondary | ICD-10-CM

## 2023-03-14 DIAGNOSIS — G2581 Restless legs syndrome: Secondary | ICD-10-CM | POA: Diagnosis present

## 2023-03-14 DIAGNOSIS — K7460 Unspecified cirrhosis of liver: Secondary | ICD-10-CM | POA: Diagnosis present

## 2023-03-14 DIAGNOSIS — M4625 Osteomyelitis of vertebra, thoracolumbar region: Secondary | ICD-10-CM

## 2023-03-14 DIAGNOSIS — E860 Dehydration: Secondary | ICD-10-CM | POA: Diagnosis present

## 2023-03-14 DIAGNOSIS — T847XXD Infection and inflammatory reaction due to other internal orthopedic prosthetic devices, implants and grafts, subsequent encounter: Secondary | ICD-10-CM

## 2023-03-14 DIAGNOSIS — G8929 Other chronic pain: Secondary | ICD-10-CM | POA: Diagnosis present

## 2023-03-14 DIAGNOSIS — D638 Anemia in other chronic diseases classified elsewhere: Secondary | ICD-10-CM | POA: Diagnosis not present

## 2023-03-14 DIAGNOSIS — M4645 Discitis, unspecified, thoracolumbar region: Secondary | ICD-10-CM

## 2023-03-14 DIAGNOSIS — I2510 Atherosclerotic heart disease of native coronary artery without angina pectoris: Secondary | ICD-10-CM | POA: Diagnosis present

## 2023-03-14 DIAGNOSIS — Z888 Allergy status to other drugs, medicaments and biological substances status: Secondary | ICD-10-CM | POA: Diagnosis not present

## 2023-03-14 DIAGNOSIS — E1122 Type 2 diabetes mellitus with diabetic chronic kidney disease: Secondary | ICD-10-CM | POA: Diagnosis present

## 2023-03-14 DIAGNOSIS — E669 Obesity, unspecified: Secondary | ICD-10-CM | POA: Diagnosis present

## 2023-03-14 DIAGNOSIS — E1142 Type 2 diabetes mellitus with diabetic polyneuropathy: Secondary | ICD-10-CM | POA: Diagnosis present

## 2023-03-14 DIAGNOSIS — Z66 Do not resuscitate: Secondary | ICD-10-CM | POA: Diagnosis present

## 2023-03-14 DIAGNOSIS — N1832 Chronic kidney disease, stage 3b: Secondary | ICD-10-CM | POA: Diagnosis present

## 2023-03-14 DIAGNOSIS — R7989 Other specified abnormal findings of blood chemistry: Secondary | ICD-10-CM | POA: Diagnosis not present

## 2023-03-14 DIAGNOSIS — R404 Transient alteration of awareness: Secondary | ICD-10-CM

## 2023-03-14 DIAGNOSIS — R7401 Elevation of levels of liver transaminase levels: Secondary | ICD-10-CM

## 2023-03-14 DIAGNOSIS — E871 Hypo-osmolality and hyponatremia: Secondary | ICD-10-CM | POA: Diagnosis present

## 2023-03-14 DIAGNOSIS — E7800 Pure hypercholesterolemia, unspecified: Secondary | ICD-10-CM | POA: Diagnosis present

## 2023-03-14 DIAGNOSIS — E878 Other disorders of electrolyte and fluid balance, not elsewhere classified: Secondary | ICD-10-CM | POA: Diagnosis present

## 2023-03-14 DIAGNOSIS — D631 Anemia in chronic kidney disease: Secondary | ICD-10-CM | POA: Diagnosis present

## 2023-03-14 LAB — COMPREHENSIVE METABOLIC PANEL
ALT: 86 U/L — ABNORMAL HIGH (ref 0–44)
AST: 125 U/L — ABNORMAL HIGH (ref 15–41)
Albumin: 1.9 g/dL — ABNORMAL LOW (ref 3.5–5.0)
Alkaline Phosphatase: 245 U/L — ABNORMAL HIGH (ref 38–126)
Anion gap: 14 (ref 5–15)
BUN: 95 mg/dL — ABNORMAL HIGH (ref 8–23)
CO2: 21 mmol/L — ABNORMAL LOW (ref 22–32)
Calcium: 8.4 mg/dL — ABNORMAL LOW (ref 8.9–10.3)
Chloride: 97 mmol/L — ABNORMAL LOW (ref 98–111)
Creatinine, Ser: 3.84 mg/dL — ABNORMAL HIGH (ref 0.44–1.00)
GFR, Estimated: 12 mL/min — ABNORMAL LOW (ref >60)
Glucose, Bld: 78 mg/dL (ref 70–99)
Potassium: 3.5 mmol/L (ref 3.5–5.1)
Sodium: 132 mmol/L — ABNORMAL LOW (ref 135–145)
Total Bilirubin: 0.7 mg/dL (ref 0.3–1.2)
Total Protein: 6.7 g/dL (ref 6.5–8.1)

## 2023-03-14 LAB — RENAL FUNCTION PANEL
Albumin: 1.8 g/dL — ABNORMAL LOW (ref 3.5–5.0)
Anion gap: 12 (ref 5–15)
BUN: 91 mg/dL — ABNORMAL HIGH (ref 8–23)
CO2: 23 mmol/L (ref 22–32)
Calcium: 8.7 mg/dL — ABNORMAL LOW (ref 8.9–10.3)
Chloride: 97 mmol/L — ABNORMAL LOW (ref 98–111)
Creatinine, Ser: 3.63 mg/dL — ABNORMAL HIGH (ref 0.44–1.00)
GFR, Estimated: 13 mL/min — ABNORMAL LOW (ref >60)
Glucose, Bld: 189 mg/dL — ABNORMAL HIGH (ref 70–99)
Phosphorus: 4.6 mg/dL (ref 2.5–4.6)
Potassium: 3.9 mmol/L (ref 3.5–5.1)
Sodium: 132 mmol/L — ABNORMAL LOW (ref 135–145)

## 2023-03-14 LAB — BPAM RBC

## 2023-03-14 LAB — CBC WITH DIFFERENTIAL/PLATELET
Abs Immature Granulocytes: 0.01 10*3/uL (ref 0.00–0.07)
Basophils Absolute: 0.1 10*3/uL (ref 0.0–0.1)
Basophils Relative: 1 %
Eosinophils Absolute: 0.4 10*3/uL (ref 0.0–0.5)
Eosinophils Relative: 9 %
HCT: 24.3 % — ABNORMAL LOW (ref 36.0–46.0)
Hemoglobin: 7.9 g/dL — ABNORMAL LOW (ref 12.0–15.0)
Immature Granulocytes: 0 %
Lymphocytes Relative: 27 %
Lymphs Abs: 1.4 10*3/uL (ref 0.7–4.0)
MCH: 29.3 pg (ref 26.0–34.0)
MCHC: 32.5 g/dL (ref 30.0–36.0)
MCV: 90 fL (ref 80.0–100.0)
Monocytes Absolute: 0.7 10*3/uL (ref 0.1–1.0)
Monocytes Relative: 13 %
Neutro Abs: 2.4 10*3/uL (ref 1.7–7.7)
Neutrophils Relative %: 50 %
Platelets: 93 10*3/uL — ABNORMAL LOW (ref 150–400)
RBC: 2.7 MIL/uL — ABNORMAL LOW (ref 3.87–5.11)
RDW: 14.5 % (ref 11.5–15.5)
WBC: 4.9 10*3/uL (ref 4.0–10.5)
nRBC: 0 % (ref 0.0–0.2)

## 2023-03-14 LAB — GLUCOSE, CAPILLARY
Glucose-Capillary: 77 mg/dL (ref 70–99)
Glucose-Capillary: 178 mg/dL — ABNORMAL HIGH (ref 70–99)
Glucose-Capillary: 182 mg/dL — ABNORMAL HIGH (ref 70–99)
Glucose-Capillary: 174 mg/dL — ABNORMAL HIGH (ref 70–99)

## 2023-03-14 LAB — HEMOGLOBIN A1C
Hgb A1c MFr Bld: 7.6 % — ABNORMAL HIGH (ref 4.8–5.6)
Mean Plasma Glucose: 171 mg/dL

## 2023-03-14 LAB — OSMOLALITY: Osmolality: 316 mOsm/kg — ABNORMAL HIGH (ref 275–295)

## 2023-03-14 LAB — FUNGAL ORGANISM REFLEX

## 2023-03-14 LAB — MAGNESIUM: Magnesium: 1.6 mg/dL — ABNORMAL LOW (ref 1.7–2.4)

## 2023-03-14 LAB — HEPATITIS PANEL, ACUTE
HCV Ab: NONREACTIVE
Hep A IgM: NONREACTIVE
Hep B C IgM: NONREACTIVE
Hepatitis B Surface Ag: NONREACTIVE

## 2023-03-14 LAB — FUNGUS CULTURE WITH STAIN

## 2023-03-14 LAB — PREPARE RBC (CROSSMATCH)

## 2023-03-14 LAB — FUNGUS CULTURE RESULT

## 2023-03-14 LAB — HEMOGLOBIN AND HEMATOCRIT, BLOOD
HCT: 22.6 % — ABNORMAL LOW (ref 36.0–46.0)
Hemoglobin: 7.5 g/dL — ABNORMAL LOW (ref 12.0–15.0)

## 2023-03-14 LAB — TYPE AND SCREEN
ABO/RH(D): A POS
Unit division: 0

## 2023-03-14 LAB — SODIUM, URINE, RANDOM: Sodium, Ur: 39 mmol/L

## 2023-03-14 LAB — OSMOLALITY, URINE: Osmolality, Ur: 225 mOsm/kg — ABNORMAL LOW (ref 300–900)

## 2023-03-14 MED ORDER — PIPERACILLIN-TAZOBACTAM IN DEX 2-0.25 GM/50ML IV SOLN
2.2500 g | Freq: Three times a day (TID) | INTRAVENOUS | Status: DC
  Filled 2023-03-14: qty 50

## 2023-03-14 MED ORDER — MAGNESIUM SULFATE 2 GM/50ML IV SOLN
2.0000 g | Freq: Once | INTRAVENOUS | Status: AC
  Administered 2023-03-14: 2 g via INTRAVENOUS
  Filled 2023-03-14: qty 50

## 2023-03-14 MED ORDER — SODIUM CHLORIDE 0.9% IV SOLUTION: Freq: Once | INTRAVENOUS | Status: AC

## 2023-03-14 MED ORDER — LACTATED RINGERS IV SOLN: INTRAVENOUS | Status: DC

## 2023-03-14 MED ORDER — SODIUM CHLORIDE 0.9 % IV SOLN
8.0000 mg/kg | INTRAVENOUS | Status: DC
  Administered 2023-03-14 – 2023-03-18 (×3): 500 mg via INTRAVENOUS
  Filled 2023-03-14 (×3): qty 10

## 2023-03-14 MED ORDER — BLISTEX MEDICATED EX OINT: TOPICAL_OINTMENT | CUTANEOUS | Status: DC | PRN

## 2023-03-14 MED ORDER — POLYETHYLENE GLYCOL 3350 17 G PO PACK
17.0000 g | PACK | Freq: Every day | ORAL | Status: DC
  Administered 2023-03-14 – 2023-03-16 (×3): 17 g via ORAL
  Filled 2023-03-14 (×5): qty 1

## 2023-03-14 MED ORDER — SODIUM CHLORIDE 0.9 % IV SOLN
500.0000 mg | INTRAVENOUS | Status: DC
  Administered 2023-03-14 – 2023-03-18 (×5): 500 mg via INTRAVENOUS
  Filled 2023-03-14 (×5): qty 0.5

## 2023-03-14 NOTE — ED Notes (Signed)
Pt voided urine in bedpan

## 2023-03-14 NOTE — Progress Notes (Signed)
Daily Progress Note Intern Pager: [phone]  Patient name: Katelyn [name] Medical record number: [id number] Date of birth: 01-24-1956 Age: 67 y.o. Gender: female  Primary Care Provider: Olive Bass, MD Consultants: Infectious Disease Code Status: DNR/DNI  Pt Overview and Major Events to Date:  -Admitted 03/14/23  Assessment and Plan:  Katelyn [name] is a 67 y.o. admitted for AMS in the setting acute on chronic renal failure. Pertinent PMH/PSH includes T2DM, CAD, ACD, CHF, discitis (ongoing IV treatment via PICC), Lumbar stenosis s/p insertion and removal of stimulator, HTN, Compensated cirrhosis.   Active Hospital Problems   *AMS (altered mental status)           Neurologic exam continues to be reassuring,           suggestive of dehydration and uremia causing AMS.           No signs of active infection.-Hold cefepime and           daptomycin for osteomyelitis, appreciate ID           assistance           -Follow-up ID recommendations           -Neurochecks every 4 hours           -Delirium precautions           -Fall precautions           -A.m. CMP    Acute renal failure (ARF) (HCC)           Likely multifactorial in the setting of daptomycin           causing myoglobinuria, acute dehydration in the           setting of AMS, and possible progression of           chronic renal disease. Continue to monitor while           on mIVF.           -Foley for strict I/O           --Follow-up ID recs           --Continue mIVF           -CMP AM           -Consider Nephrology consult in if not improving    Electrolyte imbalance           Corrected serum Osm 301. Hyponatremia likely           inaccurate in the setting of uremia. Will continue           monitor.           -Renal/carb modified diet as tolerated    Elevated LFTs           Will rule out hepatitis. Likely elevated in the           setting of renal injury.           -A.m. CMP           --Hepatitis panel     Osteomyelitis (HCC)           Follow-up ID recommendations.-Follow-up ID           recommendations, appreciate assistance           -Add on Tylenol 650 mg every 6 as needed for pain    Anemia of chronic disease  Hemoglobin 7.9 from 8.1  Required transfusion at           last admission for hemoglobin 6.8. Currently not           symptomatic. Consider transfusion give CAD, CHF.           -AM CBC           --H&H    Chronic diastolic CHF (congestive heart failure) (HCC)           Per patient peripheral edema is chronic,           reassuringly no other signs of volume overload.           Continued caution with fluids. If drinking           appropriately today will discontinue.           -Hold Lasix in setting of renal failure           -Caution with maintenance IV fluids           --Continue Coreg    Type 2 diabetes mellitus with diabetic neuropathy, unspecified (HCC)           CBG 78->77.  A1c in 11/2022 7.2.-Hold home           long-acting insulin given decreased p.o.           -SSI and adjust long-acting insulin as appropriate           -CBGs 3 times daily with meals and at night           -A1c f/u   Resolved Hospital Problems No resolved problems to display.     FEN/GI: Renal/Carb diet PPx: SCDs Dispo:Pending clinical improvement.  Subjective:  Feels her mental status is more clear today than when she was in the ED. Ate and drank well at break fast.   Objective: Temp:  [97.4 F (36.3 C)-98.1 F (36.7 C)] 98 F (36.7 C) (06/05 1159) Pulse Rate:  [64-93] 78 (06/05 0737) Resp:  [9-19] 10 (06/05 1159) BP: (101-173)/(44-128) 153/66 (06/05 1159) SpO2:  [95 %-100 %] 98 % (06/05 1159) Weight:  [84.7 kg] 84.7 kg (06/05 0410) Physical Exam: General: NAD, rest comfortably in hospital bed HEENT: dry mucous membranes Neuro: A&O x4, no facial asymmetry Cardiovascular: RRR, no murmurs, 1+ peripheral edema Respiratory: normal WOB on RA, CTAB, no wheezes, ronchi or  rales Abdomen: soft, NTTP, no rebound or guarding Extremities: Moving all 4 extremities equally, CR <2s   Laboratory: Most recent CBC Lab Results  Component Value Date   WBC 4.9 03/14/2023   HGB 7.9 (L) 03/14/2023   HCT 24.3 (L) 03/14/2023   MCV 90.0 03/14/2023   PLT 93 (L) 03/14/2023   Most recent BMP    Latest Ref Rng & Units 03/14/2023    1:20 AM  BMP  Glucose 70 - 99 mg/dL 78   BUN 8 - 23 mg/dL 95   Creatinine 1.61 - 1.00 mg/dL 0.96   Sodium 045 - 409 mmol/L 132   Potassium 3.5 - 5.1 mmol/L 3.5   Chloride 98 - 111 mmol/L 97   CO2 22 - 32 mmol/L 21   Calcium 8.9 - 10.3 mg/dL 8.4     Urine Na 39 wnl Urine OSM 225 decreased Serum OSM 316 Magnesium 1.7  Imaging/Diagnostic Tests: No new imaging.  Katelyn Mans, MD 03/14/2023, 1:29 PM  PGY-1, [facility] FPTS Intern pager: [phone], text pages welcome Secure chat group [facility]  Hospital Teaching Service

## 2023-03-14 NOTE — Progress Notes (Signed)
   03/14/23 1537  Spiritual Encounters  Type of Visit Initial  Care provided to: Pt and family  Referral source Patient request  Reason for visit Routine spiritual support  OnCall Visit No  Spiritual Framework  Presenting Themes Meaning/purpose/sources of inspiration;Values and beliefs;Coping tools;Impactful experiences and emotions  Community/Connection Family  Interventions  Spiritual Care Interventions Made Established relationship of care and support;Compassionate presence;Reflective listening;Narrative/life review;Bereavement/grief support;Prayer  Intervention Outcomes  Outcomes Connection to spiritual care;Awareness of support;Patient family open to resources   Chaplain responded to spiritual consult with PT asking for prayer.  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  The losses themselves were over 20 years ago, but PT is currently experiencing significant confusion due to a urinary track infection that has gone undetected for a time. Due to PT's confusion, chap did not try to help PT process her experiences but rather listened to her as she talked about them and shared her memories and feelings regarding them.

## 2023-03-14 NOTE — Hospital Course (Addendum)
Katelyn [name] is a 67 y.o. female who was admitted to the [facility] Medicine Teaching Service at [facility] for altered mental status. Hospital course is outlined below by problem.   Altered mental status Presented with tangential speech and history of visual/auditory hallucinations per husband.  BUN found to be 95, creatinine elevated to 4.06.  Question decreased metabolism of opioids, Lyrica, Remeron, and cefepime.  CT head negative for acute abnormality.  EEG negative. ID consulted who initially recommended holding cefepime and daptomycin. Improved with d/c of cefepime. Ertapenem and Daptomycin continued at home with PICC.   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  UA with moderate hemoglobin, protein 30, few bacteria.  Had history of decreased p.o. intake in setting of altered mental status.  CT abdomen pelvis without abnormality.  Recent renal ultrasound normal.  Bladder scans with mild retention, Foley placed given she was on IV fluids.  With fluids, renal function was improved by discharge.  Elevated LFTs Presented with AST 148, ALT 97, alk phos 271.  History of elevations to more mild degree of February 2024.  Does have history of cirrhosis, though no heavy alcohol use per report) as evidenced by CT abdomen pelvis.  Question some element of medication effect and hypovolemia causing acute worsening.  Maintenance IV fluids were started.  By discharge, LFTs had improved.  Electrolyte imbalance Presented hyponatremic to 129, appears she has been intermittently hyponatremic since 11/2022. Hypochloremic to 95 which was present at last admission. Consider worsening in setting of decreased p.o. intake.  Serum osmolality 316, urine osmolality 225, and urine sodium 39.  Urine studies likely difficult to interpret in setting of uremia.  By discharge, lytes normal.  Osteomyelitis Diagnosed at last admission, discharged in May 2024 on IV daptomycin and cefepime through PICC.   Take antibiotics as prescribed.  Initially held antibiotics per ID recommendation on admission.  Daptomycin was only restarted with cefepime switched to ertapenem.  Last dose of medication 04/03/2023.  Gave 15 tabs of oxycodone for chronic pain, needs to obtain any further rx from PCP.   Anemia of chronic disease Hemoglobin 8.3, up from 8.1 on 02/22/2023.  Required transfusion at last admission for hemoglobin 6.8.  Hemoglobin over admission 7.5, likely due to dilution; one unit packed red blood cells administered given history of CAD.  By discharge, hemoglobin was 8.3.  Type 2 diabetes mellitus with diabetic neuropathy, unspecified (HCC) Discharged in May 2024 on 20 units long-acting insulin with aspart scale with meals.  Patient has not been taking in normal amounts of p.o. at home. A1c in 11/2022 7.2.  She was started on sliding scale insulin given decreased p.o.  By discharge, she was prescribed her home dosage that she was on prior to admission to take at home.  Chronic diastolic CHF (congestive heart failure) (HCC) Presented with 1+ pitting edema to bilateral lower extremities with no crackles appreciated pulmonary exam.  Last echo 01/2023 with mild LVH, LVEF 40 to 45%, elevated LA and LV end-diastolic pressures, mild diffuse hypokinesia, mild TVR, pulmonary artery systolic pressure moderate to severely dilated at 62 mmHg. Fluids given as above, Lasix held given renal failure.  At discharge, continued holding diuretics, will have her see PCP for evaluation of need.  Other conditions that were chronic and stable: CAD (continue home aspirin), OSA (not on BiPAP anymore after titration study), hypertension (continue home Coreg), HLD (not on meds)  Issues for follow up: Consider restarting rosuvastatin 10 mg given comorbidities Evaluate  chronic thrombocytopenia Follow up on elevated LFTs, may need GGT/RUQ U/S Anemia, iron 32--may need iron supplementation. UTD on colonoscopy?  Needs CMP/CBC on follow  up, may need outpatient nephrology if kidney function worsens  Daptomycin and Ertapenem at home, ensure sees ID in follow up  Monitor CBGs, alter insulin as needed  Evaluate mental status

## 2023-03-14 NOTE — ED Notes (Signed)
ED TO INPATIENT HANDOFF REPORT  ED Nurse Name and Phone #: [phone]  S Name/Age/Gender Katelyn [name] 67 y.o. female Room/Bed: 017C/017C  Code Status   Code Status: DNR  Home/SNF/Other Home Patient oriented to: self, place, and situation Is this baseline? No   Triage Complete: Triage complete  Chief Complaint Acute renal failure (ARF) (HCC) [N17.9]  Triage Note Patient being treated at home with IV abx for bladder infection. Husband now notices that she is talking to people who aren't in the room and being forgetful and having nausea.    Allergies Allergies  Allergen Reactions   Atorvastatin Nausea And Vomiting and Other (See Comments)    MYALGIAS   Talwin [Pentazocine] Other (See Comments)    headache   Gabapentin Swelling   Other Nausea Only    UNSPECIFIED Anesthesia    Level of Care/Admitting Diagnosis ED Disposition     ED Disposition  Admit   Condition  --   Comment  Hospital Area: [facility] [[id number]]  Level of Care: Progressive [102]  Admit to Progressive based on following criteria: MULTISYSTEM THREATS such as stable sepsis, metabolic/electrolyte imbalance with or without encephalopathy that is responding to early treatment.  May place patient in observation at [facility] or [facility] if equivalent level of care is available:: No  Covid Evaluation: Asymptomatic - no recent exposure (last 10 days) testing not required  Diagnosis: Acute renal failure (ARF) [facility]) [[id number]]  Admitting Physician: Evette Georges [[id number]]  Attending Physician: Westley Chandler [[id number]]          B Medical/Surgery History Past Medical History:  Diagnosis Date   Anxiety    Arthritis    Bursitis of right hip    CAD (coronary artery disease)    Cardiac catheterization June 2014 in [place] - 50% circumflex stenosis   Chest pain, neg MI, stable CAD non obstructive on cath 10/05/20 10/04/2020   Chronic diastolic heart failure (HCC) 08/20/2017   Chronic  kidney disease, stage 3 (HCC)    does not see nephrologist   Chronic low back pain without sciatica 03/14/2016   CKD (chronic kidney disease), stage III (HCC) 10/07/2020   Complication of anesthesia    Cough 04/19/2017   Overview:  Last Assessment & Plan:  Formatting of this note may be different from the original. Cough - ? ACE related with AR triggers   Plan  Patient Instructions  Discuss with your primary doctor that lisinopril pain, need making your cough worse. May use Mucinex DM twice daily as needed for cough and congestion Zyrtec 10 mg at bedtime as needed for drainage Saline nasal spray as needed. Lab tests today Activity as tolerated. Follow with Dr. Craige Cotta in 3-4 months and As needed   Please contact office for sooner follow up if symptoms do not improve or worsen or seek emergency care    Depression    Dyspnea    with exertion   " lazy lung" - per  Dr Craige Cotta from back issues- 06/2016   Elevated liver enzymes 12/05/2016   Essential hypertension    GERD (gastroesophageal reflux disease)    Gout 03/14/2016   Greater trochanteric bursitis of right hip 02/02/2012   H/O hiatal hernia    Heart murmur    History of blood transfusion 2016   History of esophageal stricture 10/07/2020   History of kidney stones    Hypercholesterolemia    Hypertensive heart disease with heart failure (HCC) 01/01/2017   Hypoxia 10/07/2020  Iliotibial band syndrome of right side 02/02/2012   Iron deficiency anemia due to chronic blood loss 03/14/2016   LBBB (left bundle branch block) 01/01/2017   Left bundle branch block    Leg weakness 10/07/2020   Lumbar stenosis    Meralgia paraesthetica 12/05/2016   Mild CAD 11/24/2015   Morbid obesity (HCC) 10/07/2020   Neuropathy    OSA (obstructive sleep apnea) 05/24/2016   Overview:  Managed PULM   PONV (postoperative nausea and vomiting)    "no N/V with patch"   Restless leg syndrome    S/P lumbar laminectomy 11/26/2015   S/P lumbar spinal fusion 08/29/2016   Tinnitus  12/05/2016   Type 2 diabetes mellitus (HCC)    UTI (urinary tract infection) 10/07/2020   Past Surgical History:  Procedure Laterality Date   ABDOMINAL HYSTERECTOMY  1983   APPENDECTOMY  Age 51   BACK SURGERY     FIRST LUMBAR FUSION/ SURGERY APRIL 2012 AND FUSION WITH INSTRUMENTATION SEPT 2012   CARDIAC CATHETERIZATION     x 2   CARPAL TUNNEL RELEASE     bil   CHOLECYSTECTOMY  1990's   COLONOSCOPY  12/12/2017   Colonic polyp status post polypectomy. Mild sigmoid diverticulosis. Otherwise normal colonoscopy to terminal ileum.   CYSTO EXTRACTION KIDNEY STONES     ESOPHAGOGASTRODUODENOSCOPY  12/12/2017   Small hiatal hernia. Mild gastritis. Status post esophageal dilatation.   EXCISION/RELEASE BURSA HIP  02/02/2012   Procedure: EXCISION/RELEASE BURSA HIP;  Surgeon: Jacki Cones, MD;  Location: [facility];  Service: Orthopedics;  Laterality: Right;  Right Hip Bursectomy   EYE SURGERY Bilateral    cataracts   IR THORACIC DISC ASPIRATION W/IMG GUIDE  02/14/2023   KIDNEY STONE SURGERY  2008   LAMINECTOMY WITH POSTERIOR LATERAL ARTHRODESIS LEVEL 4 N/A 02/19/2023   Procedure: THORACIC TEN-LUMBAR TWO INSTRUMENTED FUSION;  Surgeon: Tia Alert, MD;  Location: [facility];  Service: Neurosurgery;  Laterality: N/A;   Left knee surgery x 2  1996   reconstruction   LUMBAR DISC SURGERY  08/2016   LUMBAR LAMINECTOMY/DECOMPRESSION MICRODISCECTOMY Right 11/26/2015   Procedure: Extraforaminal Microdiscectomy  - Lumbar two-three- right;  Surgeon: Tia Alert, MD;  Location: [facility];  Service: Neurosurgery;  Laterality: Right;  right    LUMBAR WOUND DEBRIDEMENT N/A 10/25/2016   Procedure: Lumbar wound revision;  Surgeon: Tia Alert, MD;  Location: [facility] OR;  Service: Neurosurgery;  Laterality: N/A;  Lumbar wound revision   Right shoulder surgery  2010   spur   RIGHT/LEFT HEART CATH AND CORONARY ANGIOGRAPHY N/A 10/05/2020   Procedure: RIGHT/LEFT HEART CATH AND CORONARY ANGIOGRAPHY;  Surgeon: [place],  Peter M, MD;  Location: [facility] INVASIVE CV LAB;  Service: Cardiovascular;  Laterality: N/A;   SPINAL CORD STIMULATOR REMOVAL  02/19/2023   Procedure: LUMBAR SPINAL CORD STIMULATOR REMOVAL;  Surgeon: Tia Alert, MD;  [facility];  Service: Neurosurgery;;     A IV Location/Drains/Wounds Patient Lines/Drains/Airways Status     Active Line/Drains/Airways     Name Placement date Placement time Site Days   PICC Single Lumen 02/22/23 Right Brachial 36 cm 0 cm 02/22/23  0900  Brachial  20            Intake/Output Last 24 hours  Intake/Output Summary (Last 24 hours) at 03/14/2023 0259 Last data filed at 03/13/2023 2145 Gross per 24 hour  Intake 10 ml  Output 750 ml  Net -740 ml    Labs/Imaging Results for orders placed  or performed during the hospital encounter of 03/13/23 (from the past 48 hour(s))  Lactic acid, plasma     Status: None   Collection Time: 03/13/23 12:07 PM  Result Value Ref Range   Lactic Acid, Venous 0.8 0.5 - 1.9 mmol/L    Comment: Performed at [facility], [address] [place], [place] [address]  Comprehensive metabolic panel     Status: Abnormal   Collection Time: 03/13/23 12:07 PM  Result Value Ref Range   Sodium 129 (L) 135 - 145 mmol/L   Potassium 3.9 3.5 - 5.1 mmol/L   Chloride 95 (L) 98 - 111 mmol/L   CO2 20 (L) 22 - 32 mmol/L   Glucose, Bld 184 (H) 70 - 99 mg/dL    Comment: Glucose reference range applies only to samples taken after fasting for at least 8 hours.   BUN 95 (H) 8 - 23 mg/dL   Creatinine, Ser 2.13 (H) 0.44 - 1.00 mg/dL   Calcium 8.4 (L) 8.9 - 10.3 mg/dL   Total Protein 7.2 6.5 - 8.1 g/dL   Albumin 2.2 (L) 3.5 - 5.0 g/dL   AST 086 (H) 15 - 41 U/L   ALT 97 (H) 0 - 44 U/L   Alkaline Phosphatase 271 (H) 38 - 126 U/L   Total Bilirubin 0.4 0.3 - 1.2 mg/dL   GFR, Estimated 12 (L) >60 mL/min    Comment: (NOTE) Calculated using the CKD-EPI Creatinine Equation (2021)    Anion gap 14 5 - 15    Comment: Performed at [facility], [address] [place], [place] [address]  CBC with Differential     Status: Abnormal   Collection Time: 03/13/23 12:07 PM  Result Value Ref Range   WBC 5.1 4.0 - 10.5 K/uL   RBC 2.86 (L) 3.87 - 5.11 MIL/uL   Hemoglobin 8.3 (L) 12.0 - 15.0 g/dL   HCT 96.2 (L) 95.2 - 84.1 %   MCV 89.9 80.0 - 100.0 fL   MCH 29.0 26.0 - 34.0 pg   MCHC 32.3 30.0 - 36.0 g/dL   RDW 32.4 40.1 - 02.7 %   Platelets 104 (L) 150 - 400 K/uL    Comment: Immature Platelet Fraction may be clinically indicated, consider ordering this additional test [id number] REPEATED TO VERIFY    nRBC 0.0 0.0 - 0.2 %   Neutrophils Relative % 60 %   Neutro Abs 3.0 1.7 - 7.7 K/uL   Lymphocytes Relative 18 %   Lymphs Abs 0.9 0.7 - 4.0 K/uL   Monocytes Relative 13 %   Monocytes Absolute 0.7 0.1 - 1.0 K/uL   Eosinophils Relative 8 %   Eosinophils Absolute 0.4 0.0 - 0.5 K/uL   Basophils Relative 1 %   Basophils Absolute 0.1 0.0 - 0.1 K/uL   Immature Granulocytes 0 %   Abs Immature Granulocytes 0.02 0.00 - 0.07 K/uL    Comment: Performed at [facility], [address], [place], [place] [address]  Urinalysis, Routine w reflex microscopic -Urine, Clean Catch     Status: Abnormal   Collection Time: 03/13/23  7:27 PM  Result Value Ref Range   Color, Urine STRAW (A) YELLOW   APPearance CLEAR CLEAR   Specific Gravity, Urine 1.005 1.005 - 1.030   pH 6.0 5.0 - 8.0   Glucose, UA NEGATIVE NEGATIVE mg/dL   Hgb urine dipstick MODERATE (A) NEGATIVE   Bilirubin Urine NEGATIVE NEGATIVE   Ketones, ur NEGATIVE NEGATIVE mg/dL  Protein, ur 30 (A) NEGATIVE mg/dL   Nitrite NEGATIVE NEGATIVE   Leukocytes,Ua NEGATIVE NEGATIVE   RBC / HPF 0-5 0 - 5 RBC/hpf   WBC, UA 0-5 0 - 5 WBC/hpf   Bacteria, UA FEW (A) NONE SEEN   Squamous Epithelial / HPF 0-5 0 - 5 /HPF   Mucus PRESENT    Amorphous Crystal PRESENT     Comment: Performed at [facility], [address] [place], [place] [address]  Magnesium     Status: None    Collection Time: 03/13/23  7:27 PM  Result Value Ref Range   Magnesium 1.7 1.7 - 2.4 mg/dL    Comment: Performed at [facility], [address], [place], [place] [address]  CK     Status: Abnormal   Collection Time: 03/13/23  7:27 PM  Result Value Ref Range   Total CK 498 (H) 38 - 234 U/L    Comment: Performed at [facility], [address], [place], [place] [address]  Sodium, urine, random     Status: None   Collection Time: 03/14/23  1:04 AM  Result Value Ref Range   Sodium, Ur 39 mmol/L    Comment: Performed at [facility], [address], [place], [place] [address]  Comprehensive metabolic panel     Status: Abnormal   Collection Time: 03/14/23  1:20 AM  Result Value Ref Range   Sodium 132 (L) 135 - 145 mmol/L   Potassium 3.5 3.5 - 5.1 mmol/L   Chloride 97 (L) 98 - 111 mmol/L   CO2 21 (L) 22 - 32 mmol/L   Glucose, Bld 78 70 - 99 mg/dL    Comment: Glucose reference range applies only to samples taken after fasting for at least 8 hours.   BUN 95 (H) 8 - 23 mg/dL   Creatinine, Ser 6.57 (H) 0.44 - 1.00 mg/dL   Calcium 8.4 (L) 8.9 - 10.3 mg/dL   Total Protein 6.7 6.5 - 8.1 g/dL   Albumin 1.9 (L) 3.5 - 5.0 g/dL   AST 846 (H) 15 - 41 U/L   ALT 86 (H) 0 - 44 U/L   Alkaline Phosphatase 245 (H) 38 - 126 U/L   Total Bilirubin 0.7 0.3 - 1.2 mg/dL   GFR, Estimated 12 (L) >60 mL/min    Comment: (NOTE) Calculated using the CKD-EPI Creatinine Equation (2021)    Anion gap 14 5 - 15    Comment: Performed at [facility], [address], [place], [place] [address]  CBC with Differential/Platelet     Status: Abnormal   Collection Time: 03/14/23  1:20 AM  Result Value Ref Range   WBC 4.9 4.0 - 10.5 K/uL   RBC 2.70 (L) 3.87 - 5.11 MIL/uL   Hemoglobin 7.9 (L) 12.0 - 15.0 g/dL   HCT 28.4 (L) 13.2 - 44.0 %   MCV 90.0 80.0 - 100.0 fL   MCH 29.3 26.0 - 34.0 pg   MCHC 32.5 30.0 - 36.0 g/dL   RDW 10.2 72.5 - 36.6 %   Platelets 93 (L) 150 - 400 K/uL    Comment:  Immature Platelet Fraction may be clinically indicated, consider ordering this additional test [id number] REPEATED TO VERIFY    nRBC 0.0 0.0 - 0.2 %   Neutrophils Relative % 50 %   Neutro Abs 2.4 1.7 - 7.7 K/uL   Lymphocytes Relative 27 %   Lymphs Abs 1.4 0.7 - 4.0 K/uL  Monocytes Relative 13 %   Monocytes Absolute 0.7 0.1 - 1.0 K/uL   Eosinophils Relative 9 %   Eosinophils Absolute 0.4 0.0 - 0.5 K/uL   Basophils Relative 1 %   Basophils Absolute 0.1 0.0 - 0.1 K/uL   Immature Granulocytes 0 %   Abs Immature Granulocytes 0.01 0.00 - 0.07 K/uL    Comment: Performed at [facility], [address], [place], [place] [address]  Magnesium     Status: Abnormal   Collection Time: 03/14/23  1:20 AM  Result Value Ref Range   Magnesium 1.6 (L) 1.7 - 2.4 mg/dL    Comment: Performed at [facility], [address], [place], [place] [address]   CT ABDOMEN PELVIS WO CONTRAST  Result Date: 03/13/2023 CLINICAL DATA:  Nausea and abdominal pain. Recently started on IV antibiotics for spinal epidural abscess. EXAM: CT ABDOMEN AND PELVIS WITHOUT CONTRAST TECHNIQUE: Multidetector CT imaging of the abdomen and pelvis was performed following the standard protocol without IV contrast. RADIATION DOSE REDUCTION: This exam was performed according to the departmental dose-optimization program which includes automated exposure control, adjustment of the mA and/or kV according to patient size and/or use of iterative reconstruction technique. COMPARISON:  CT abdomen and pelvis 01/11/2023 and CT lumbar spine 02/14/2023 FINDINGS: Lower chest: No acute abnormality. Hepatobiliary: No focal liver abnormality is seen. Mild nodularity of the hepatic contour compatible with cirrhosis. Status post cholecystectomy. No biliary dilatation. Pancreas: Unremarkable. Spleen: Unremarkable. Adrenals/Urinary Tract: Stable adrenal glands. No urinary calculi or hydronephrosis. Unremarkable bladder. Stomach/Bowel: Normal caliber  large and small bowel. Moderate colonic stool load. No bowel wall thickening. Stomach is within normal limits. The appendix is not definitively visualized. Vascular/Lymphatic: Aortic atherosclerosis. No enlarged abdominal or pelvic lymph nodes. Reproductive: Status post hysterectomy. No adnexal masses. Other: No free intraperitoneal fluid or air. Musculoskeletal: Similar endplate destruction at T11-12 consistent with discitis-osteomyelitis. Streak artifact from adjacent hardware limited assessment for epidural phlegmon/abscess. Thoracolumbar fusion. No acute fracture. IMPRESSION: 1. No acute abnormality identified in the abdomen or pelvis. 2. Moderate colonic stool load. 3. Cirrhosis. 4. Similar endplate destruction at T11-12 consistent with discitis-osteomyelitis. Aortic Atherosclerosis (ICD10-I70.0). Electronically Signed   By: Minerva Fester M.D.   On: 03/13/2023 17:56   CT HEAD WO CONTRAST  Result Date: 03/13/2023 CLINICAL DATA:  Mental status changes EXAM: CT HEAD WITHOUT CONTRAST TECHNIQUE: Contiguous axial images were obtained from the base of the skull through the vertex without intravenous contrast. RADIATION DOSE REDUCTION: This exam was performed according to the departmental dose-optimization program which includes automated exposure control, adjustment of the mA and/or kV according to patient size and/or use of iterative reconstruction technique. COMPARISON:  02/13/2023 FINDINGS: Brain: There is no evidence for acute hemorrhage, hydrocephalus, mass lesion, or abnormal extra-axial fluid collection. No definite CT evidence for acute infarction. Vascular: No hyperdense vessel or unexpected calcification. Skull: No evidence for fracture. No worrisome lytic or sclerotic lesion. Sinuses/Orbits: The visualized paranasal sinuses and mastoid air cells are clear. Visualized portions of the globes and intraorbital fat are unremarkable. Other: None. IMPRESSION: No acute intracranial abnormality. Electronically  Signed   By: Kennith Center M.D.   On: 03/13/2023 17:54    Pending Labs Unresulted Labs (From admission, onward)     Start     Ordered   03/14/23 0500  Hemoglobin A1c  Tomorrow morning,   R       Comments: To assess prior glycemic control    03/13/23 2246   03/13/23 2244  Osmolality  Once,   R        03/13/23 2246   03/13/23 2243  Osmolality, urine  Once,   R        03/13/23 2246            Vitals/Pain Today's Vitals   03/14/23 0130 03/14/23 0200 03/14/23 0205 03/14/23 0215  BP: 129/69 128/62  (!) 119/44  Pulse: 75 73  71  Resp: 17 13  11   Temp:    98.1 F (36.7 C)  TempSrc:    Oral  SpO2: 97% 96%  95%  PainSc:   Asleep     Isolation Precautions No active isolations  Medications Medications  sodium chloride flush (NS) 0.9 % injection 10-40 mL (10 mLs Intracatheter Given 03/13/23 2304)  sodium chloride flush (NS) 0.9 % injection 10-40 mL (10 mLs Intracatheter Given 03/13/23 2145)  Chlorhexidine Gluconate Cloth 2 % PADS 6 each (6 each Topical Not Given 03/13/23 1951)  aspirin EC tablet 81 mg (has no administration in time range)  carvedilol (COREG) tablet 6.25 mg (6.25 mg Oral Given 03/14/23 0111)  lactated ringers infusion ( Intravenous New Bag/Given 03/14/23 0118)  acetaminophen (TYLENOL) tablet 650 mg (has no administration in time range)  insulin aspart (novoLOG) injection 0-15 Units (has no administration in time range)  ondansetron (ZOFRAN) injection 4 mg (4 mg Intravenous Given 03/13/23 1206)  sodium chloride 0.9 % bolus 1,000 mL (0 mLs Intravenous Stopped 03/13/23 1906)  oxyCODONE-acetaminophen (PERCOCET/ROXICET) 5-325 MG per tablet 1 tablet (1 tablet Oral Given 03/13/23 1954)    Mobility walks with device     Focused Assessments    R Recommendations: See Admitting Provider Note  Report given to:   Additional Notes: a/ox3, walks with walker

## 2023-03-14 NOTE — Progress Notes (Signed)
PHARMACY CONSULT NOTE FOR:  OUTPATIENT  PARENTERAL ANTIBIOTIC THERAPY (OPAT)  Indication: Discitis/osteo Regimen: Daptomycin 500 mg IV every 48 hours and Ertapenem 500 mg IV every 24 hours End date: 04/03/23  IV antibiotic discharge orders are pended. To discharging provider:  please sign these orders via discharge navigator,  Select New Orders & click on the button choice - Manage This Unsigned Work.     Thank you for allowing pharmacy to be a part of this patient's care.  Georgina Pillion, PharmD, BCPS Infectious Diseases Clinical Pharmacist 03/16/2023 10:00 AM   **Pharmacist phone directory can now be found on [email or web address] ([email or web address]).  Listed under [facility].

## 2023-03-14 NOTE — Progress Notes (Signed)
Pharmacy Antibiotic Note  Katelyn [name] is a 67 y.o. female admitted on 03/13/2023 with discitis/osteo Pharmacy has been consulted for Daptomycin + Ertapenem dosing.  The patient is noted to be in AKI, admit 4.06 - baseline 1.3-1.8 - in the setting of poor appetite and likely dehydration. Will transition off of Cefepime in the setting of AKI + AMS and use Ertapenem for now.   Plan: - Resume Daptomycin at a lower dose of 500 mg IV every 48 hours - D/c Cefepime and start Ertapenem 500 mg IV every 24 hours - Will continue to follow renal function, culture results, LOT, and antibiotic de-escalation plans   Height: 5\' 2"  (157.5 cm) Weight: 84.7 kg (186 lb 11.7 oz) IBW/kg (Calculated) : 50.1  Temp (24hrs), Avg:97.9 F (36.6 C), Min:97.4 F (36.3 C), Max:98.6 F (37 C)  Recent Labs  Lab 03/13/23 1207 03/14/23 0120  WBC 5.1 4.9  CREATININE 4.06* 3.84*  LATICACIDVEN 0.8  --     Estimated Creatinine Clearance: 14.5 mL/min (A) (by C-G formula based on SCr of 3.84 mg/dL (H)).    Allergies  Allergen Reactions   Atorvastatin Nausea And Vomiting and Other (See Comments)    MYALGIAS   Talwin [Pentazocine] Other (See Comments)    headache   Gabapentin Swelling   Other Nausea Only    UNSPECIFIED Anesthesia     Thank you for allowing pharmacy to be a part of this patient's care.  Georgina Pillion, PharmD, BCPS Infectious Diseases Clinical Pharmacist 03/14/2023 2:32 PM   **Pharmacist phone directory can now be found on [email or web address] ([email or web address]).  Listed under [facility].

## 2023-03-14 NOTE — Assessment & Plan Note (Signed)
Follow-up ID recommendations. -Follow-up ID recommendations, appreciate assistance -Add on Tylenol 650 mg every 6 as needed for back pain

## 2023-03-14 NOTE — Procedures (Signed)
Patient Name: Katelyn [name]  MRN: [id number]  Epilepsy Attending: Charlsie Quest  Referring Physician/Provider: Levin Erp, MD Date: 03/14/2023 Duration: 23.22 mins  Patient history: 67yo F with ams getting eeg to evaluate for seizure.  Level of alertness: Awake  AEDs during EEG study: None  Technical aspects: This EEG study was done with scalp electrodes positioned according to the 10-20 International system of electrode placement. Electrical activity was reviewed with band pass filter of 1-70Hz , sensitivity of 7 uV/mm, display speed of 21mm/sec with a 60Hz  notched filter applied as appropriate. EEG data were recorded continuously and digitally stored.  Video monitoring was available and reviewed as appropriate.  Description: The posterior dominant rhythm consists of 8 Hz activity of moderate voltage (25-35 uV) seen predominantly in posterior head regions, symmetric and reactive to eye opening and eye closing. Physiologic photic driving was not seen during photic stimulation.  Hyperventilation was not performed.     IMPRESSION: This study is within normal limits. No seizures or epileptiform discharges were seen throughout the recording.  A normal interictal EEG does not exclude the diagnosis of epilepsy.  Hubert Derstine Annabelle Harman

## 2023-03-14 NOTE — TOC CM/SW Note (Signed)
Transition of Care [facility]) - Inpatient Brief Assessment   Patient Details  Name: Katelyn [name] MRN: [id number] Date of Birth: Oct 10, 1955  [facility] of [facility] CM/SW Contact:    Leone Haven, RN Phone Number: 03/14/2023, 9:02 AM   Clinical Narrative: Patient is from home with spouse, she is currently receiving HH thru [facility]  for [facility], IV abx, HHPT, HHOT.  She states she would like to continue with them.  She states she had an apt at her PCP yesterday but she had to cancel it because she was here. She has insurance on file, she has coverage for medications. She has walker, rollator, bsc, walk in shower, at home.  She is active with [facility] for [facility] services, spouse is her support system and and he will transport her home at dc.  She gets medications from [facility] pharmacy on [address] In [place].  NCM notified Jeri Modena .  NCM notified Kandee Keen with [facility] patient is in house.   Transition of Care Asessment: Insurance and Status: Insurance coverage has been reviewed Patient has primary care physician: Yes (Dr. Dina Rich) Home environment has been reviewed: home with spouse Prior level of function:: receiving HHPT, HHOT HHRN with [facility], indep Prior/Current Home Services: Current home services [facility]) Social Determinants of Health Reivew: SDOH reviewed no interventions necessary Readmission risk has been reviewed: Yes Transition of care needs: transition of care needs identified, TOC will continue to follow

## 2023-03-14 NOTE — ED Notes (Signed)
Bladder scan showed 

## 2023-03-14 NOTE — Consult Note (Signed)
Date of Admission:  03/13/2023          Reason for Consult: AMS and ARF in patient on biotics for hardware associated discitis osteomyelitis     Referring Provider:Brittany Pollie Meyer, MD    Assessment:  AMS likely from ARF, uremia AND  cefepime toxicity in context of #1 as her dose had not been adjusted Transaminitis up likely from hypoperfusion Hx of hardware and spinal stimulator associated diskitis, osteomyelitis sp Neurosurgery DM  Plan:  Restart renally dosed daptomycin Change from cefepime to ertapenem Trend CMP  Dr. Drue Second is covering tomorrow.  Principal Problem:   AMS (altered mental status) Active Problems:   Chronic diastolic CHF (congestive heart failure) (HCC)   Type 2 diabetes mellitus with diabetic neuropathy, unspecified (HCC)   Anemia of chronic disease   Osteomyelitis (HCC)   Acute renal failure (ARF) (HCC)   Electrolyte imbalance   Elevated LFTs   Acute renal failure (HCC)   Scheduled Meds:  aspirin EC  81 mg Oral Daily   carvedilol  6.25 mg Oral BID WC   Chlorhexidine Gluconate Cloth  6 each Topical Daily   insulin aspart  0-15 Units Subcutaneous TID WC   polyethylene glycol  17 g Oral Daily   sodium chloride flush  10-40 mL Intracatheter Q12H   Continuous Infusions:  lactated ringers 125 mL/hr at 03/14/23 0118   magnesium sulfate bolus IVPB 2 g (03/14/23 0952)   PRN Meds:.acetaminophen, lip balm, sodium chloride flush  HPI: Katelyn [name] is a 67 y.o. female   with prior postoperative infeciton with Serratia in 2017, more recently. T11-t12 diskitis,osteomyelitis and epidural phlegmon T8-9 thru L1-2 post spinal cord stimulator . Cultures were unrevealing and she received empiric course of vancomycin + cefepime, complicated by vancomycin related renal injury then more recently placed on cipro + doxy but with worsening pain after stimulator program changed   MRI showing progressive osteomyelitis and diskitis.  She underwent decompressive  thoracic laminectomy medial facetectomy T11-12, Posterior fixation T10 and L2 inclusive using Alphatec  pedicle screws.  Intertransverse arthrodesis T10 to L2 inclusive using morcellized autograft and allograf and Removal of entire spinal cord stimulator system.Marland Kitchen  No purulence was seen in the OR cultures could have been helpful to give targeted therapy.  We had to resort to empiric therapy which came in the form of daptomycin and cefepime.  In the past week apparently she was suffering from poor appetite with some low-grade nausea and was hardly drinking any fluids although while taking torsemide.  Also is with becoming increasingly confused with her husband having found her at night talking to someone who was not there.  She also began to have visual hallucinations as well.  In the ER the patient was tearful and labile her labs showed acute renal failure with a creatinine of 4.06 and BUN of 95.  She also had elevated transaminases with AST and ALT of 148 and 97.  Alkaline phosphatase was 271 CPK was elevated above 400.  She had a CT of the abdomen pelvis that was unremarkable for any acute findings.  She has been given fluids overnight and her antibiotics have been held.  Will plan on resuming her daptomycin but exchanging the cefepime for ertapenem given concern for CNS toxicity in particular in setting of renal failure.  I have personally spent 95 minutes involved in face-to-face and non-face-to-face activities for this patient on the day of the visit. Professional time spent includes the following activities:  Preparing to see the patient (review of tests), Obtaining and/or reviewing separately obtained history (admission/discharge record), Performing a medically appropriate examination and/or evaluation , Ordering medications/tests/procedures, referring and communicating with other health care professionals, Documenting clinical information in the EMR, Independently interpreting results (not  separately reported), Communicating results to the patient/family/caregiver, Counseling and educating the patient/family/caregiver and Care coordination (not separately reported).      Review of Systems: Review of Systems  Unable to perform ROS: Mental status change  Gastrointestinal:  Positive for nausea.  Psychiatric/Behavioral:  Positive for depression and hallucinations. The patient is nervous/anxious.     Past Medical History:  Diagnosis Date   Anxiety    Arthritis    Bursitis of right hip    CAD (coronary artery disease)    Cardiac catheterization June 2014 in [place] - 50% circumflex stenosis   Chest pain, neg MI, stable CAD non obstructive on cath 10/05/20 10/04/2020   Chronic diastolic heart failure (HCC) 08/20/2017   Chronic kidney disease, stage 3 (HCC)    does not see nephrologist   Chronic low back pain without sciatica 03/14/2016   CKD (chronic kidney disease), stage III (HCC) 10/07/2020   Complication of anesthesia    Cough 04/19/2017   Overview:  Last Assessment & Plan:  Formatting of this note may be different from the original. Cough - ? ACE related with AR triggers   Plan  Patient Instructions  Discuss with your primary doctor that lisinopril pain, need making your cough worse. May use Mucinex DM twice daily as needed for cough and congestion Zyrtec 10 mg at bedtime as needed for drainage Saline nasal spray as needed. Lab tests today Activity as tolerated. Follow with Dr. Craige Cotta in 3-4 months and As needed   Please contact office for sooner follow up if symptoms do not improve or worsen or seek emergency care    Depression    Dyspnea    with exertion   " lazy lung" - per  Dr Craige Cotta from back issues- 06/2016   Elevated liver enzymes 12/05/2016   Essential hypertension    GERD (gastroesophageal reflux disease)    Gout 03/14/2016   Greater trochanteric bursitis of right hip 02/02/2012   H/O hiatal hernia    Heart murmur    History of blood transfusion 2016   History of  esophageal stricture 10/07/2020   History of kidney stones    Hypercholesterolemia    Hypertensive heart disease with heart failure (HCC) 01/01/2017   Hypoxia 10/07/2020   Iliotibial band syndrome of right side 02/02/2012   Iron deficiency anemia due to chronic blood loss 03/14/2016   LBBB (left bundle branch block) 01/01/2017   Left bundle branch block    Leg weakness 10/07/2020   Lumbar stenosis    Meralgia paraesthetica 12/05/2016   Mild CAD 11/24/2015   Morbid obesity (HCC) 10/07/2020   Neuropathy    OSA (obstructive sleep apnea) 05/24/2016   Overview:  Managed PULM   PONV (postoperative nausea and vomiting)    "no N/V with patch"   Restless leg syndrome    S/P lumbar laminectomy 11/26/2015   S/P lumbar spinal fusion 08/29/2016   Tinnitus 12/05/2016   Type 2 diabetes mellitus (HCC)    UTI (urinary tract infection) 10/07/2020    Social History   Tobacco Use   Smoking status: Never   Smokeless tobacco: Never   Tobacco comments:    Prior secondhand smoke  Vaping Use   Vaping Use: Never used  Substance Use  Topics   Alcohol use: Yes    Alcohol/week: 0.0 standard drinks of alcohol    Comment: Rarely   Drug use: No    Family History  Problem Relation Age of Onset   Lung cancer Father        smoked   Hypertension Father    Stroke Father    Heart failure Mother    Bone cancer Sister    Asthma Sister    Allergies  Allergen Reactions   Atorvastatin Nausea And Vomiting and Other (See Comments)    MYALGIAS   Talwin [Pentazocine] Other (See Comments)    headache   Gabapentin Swelling   Other Nausea Only    UNSPECIFIED Anesthesia    OBJECTIVE: Blood pressure 139/65, pulse 78, temperature (!) 97.4 F (36.3 C), temperature source Oral, resp. rate 16, height 5\' 2"  (1.575 m), weight 84.7 kg, SpO2 100 %.  Physical Exam Constitutional:      General: She is not in acute distress.    Appearance: Normal appearance. She is well-developed. She is not ill-appearing or  diaphoretic.  HENT:     Head: Normocephalic and atraumatic.     Right Ear: Hearing and external ear normal.     Left Ear: Hearing and external ear normal.     Nose: No nasal deformity or rhinorrhea.  Eyes:     General: No scleral icterus.    Conjunctiva/sclera: Conjunctivae normal.     Right eye: Right conjunctiva is not injected.     Left eye: Left conjunctiva is not injected.     Pupils: Pupils are equal, round, and reactive to light.  Neck:     Vascular: No JVD.  Cardiovascular:     Rate and Rhythm: Normal rate and regular rhythm.     Heart sounds: S1 normal and S2 normal.  Pulmonary:     Effort: No respiratory distress.     Breath sounds: No wheezing.  Abdominal:     General: Bowel sounds are normal. There is no distension.     Palpations: Abdomen is soft.     Tenderness: There is no abdominal tenderness.  Musculoskeletal:        General: Normal range of motion.     Right shoulder: Normal.     Left shoulder: Normal.     Cervical back: Normal range of motion and neck supple.     Right hip: Normal.     Left hip: Normal.     Right knee: Normal.     Left knee: Normal.  Lymphadenopathy:     Head:     Right side of head: No submandibular, preauricular or posterior auricular adenopathy.     Left side of head: No submandibular, preauricular or posterior auricular adenopathy.     Cervical: No cervical adenopathy.     Right cervical: No superficial or deep cervical adenopathy.    Left cervical: No superficial or deep cervical adenopathy.  Skin:    General: Skin is warm and dry.     Coloration: Skin is not pale.     Findings: No abrasion, bruising, ecchymosis, erythema, lesion or rash.     Nails: There is no clubbing.  Neurological:     Mental Status: She is alert and oriented to person, place, and time.     Sensory: No sensory deficit.     Coordination: Coordination normal.     Gait: Gait normal.  Psychiatric:        Attention and Perception: Attention normal. She is  attentive.  Mood and Affect: Mood is anxious and depressed.        Speech: Speech is rapid and pressured.        Behavior: Behavior normal. Behavior is cooperative.        Thought Content: Thought content normal.        Cognition and Memory: She exhibits impaired recent memory.     Lab Results Lab Results  Component Value Date   WBC 4.9 03/14/2023   HGB 7.9 (L) 03/14/2023   HCT 24.3 (L) 03/14/2023   MCV 90.0 03/14/2023   PLT 93 (L) 03/14/2023    Lab Results  Component Value Date   CREATININE 3.84 (H) 03/14/2023   BUN 95 (H) 03/14/2023   NA 132 (L) 03/14/2023   K 3.5 03/14/2023   CL 97 (L) 03/14/2023   CO2 21 (L) 03/14/2023    Lab Results  Component Value Date   ALT 86 (H) 03/14/2023   AST 125 (H) 03/14/2023   ALKPHOS 245 (H) 03/14/2023   BILITOT 0.7 03/14/2023     Microbiology: No results found for this or any previous visit (from the past 240 hour(s)).  Acey Lav, MD [facility] [phone] pager  03/14/2023, 10:20 AM

## 2023-03-15 ENCOUNTER — Ambulatory Visit: Payer: PPO | Admitting: Internal Medicine

## 2023-03-15 ENCOUNTER — Ambulatory Visit: Payer: PPO | Admitting: Infectious Diseases

## 2023-03-15 DIAGNOSIS — R7989 Other specified abnormal findings of blood chemistry: Secondary | ICD-10-CM

## 2023-03-15 DIAGNOSIS — E1140 Type 2 diabetes mellitus with diabetic neuropathy, unspecified: Secondary | ICD-10-CM | POA: Diagnosis not present

## 2023-03-15 DIAGNOSIS — N179 Acute kidney failure, unspecified: Secondary | ICD-10-CM

## 2023-03-15 LAB — COMPREHENSIVE METABOLIC PANEL
ALT: 74 U/L — ABNORMAL HIGH (ref 0–44)
AST: 90 U/L — ABNORMAL HIGH (ref 15–41)
Albumin: 1.9 g/dL — ABNORMAL LOW (ref 3.5–5.0)
Alkaline Phosphatase: 257 U/L — ABNORMAL HIGH (ref 38–126)
Anion gap: 9 (ref 5–15)
BUN: 89 mg/dL — ABNORMAL HIGH (ref 8–23)
CO2: 23 mmol/L (ref 22–32)
Calcium: 8.6 mg/dL — ABNORMAL LOW (ref 8.9–10.3)
Chloride: 101 mmol/L (ref 98–111)
Creatinine, Ser: 3.7 mg/dL — ABNORMAL HIGH (ref 0.44–1.00)
GFR, Estimated: 13 mL/min — ABNORMAL LOW (ref >60)
Glucose, Bld: 153 mg/dL — ABNORMAL HIGH (ref 70–99)
Potassium: 3.9 mmol/L (ref 3.5–5.1)
Sodium: 133 mmol/L — ABNORMAL LOW (ref 135–145)
Total Bilirubin: 0.8 mg/dL (ref 0.3–1.2)
Total Protein: 6.5 g/dL (ref 6.5–8.1)

## 2023-03-15 LAB — GLUCOSE, CAPILLARY
Glucose-Capillary: 147 mg/dL — ABNORMAL HIGH (ref 70–99)
Glucose-Capillary: 203 mg/dL — ABNORMAL HIGH (ref 70–99)
Glucose-Capillary: 124 mg/dL — ABNORMAL HIGH (ref 70–99)
Glucose-Capillary: 241 mg/dL — ABNORMAL HIGH (ref 70–99)

## 2023-03-15 LAB — BPAM RBC
Blood Product Expiration Date: 202406282359
ISSUE DATE / TIME: 202406052215
Unit Type and Rh: 6200

## 2023-03-15 LAB — MAGNESIUM: Magnesium: 2.1 mg/dL (ref 1.7–2.4)

## 2023-03-15 LAB — HEMOGLOBIN AND HEMATOCRIT, BLOOD
HCT: 24.8 % — ABNORMAL LOW (ref 36.0–46.0)
Hemoglobin: 8.4 g/dL — ABNORMAL LOW (ref 12.0–15.0)

## 2023-03-15 LAB — TYPE AND SCREEN: Antibody Screen: NEGATIVE

## 2023-03-15 MED ORDER — ACETAMINOPHEN 500 MG PO TABS
1000.0000 mg | ORAL_TABLET | Freq: Two times a day (BID) | ORAL | Status: DC | PRN
  Administered 2023-03-15 – 2023-03-17 (×2): 1000 mg via ORAL
  Filled 2023-03-15 (×3): qty 2

## 2023-03-15 MED ORDER — OXYCODONE HCL 5 MG PO TABS
2.5000 mg | ORAL_TABLET | Freq: Once | ORAL | Status: AC
  Administered 2023-03-15: 2.5 mg via ORAL
  Filled 2023-03-15: qty 1

## 2023-03-15 MED ORDER — ACETAMINOPHEN 500 MG PO TABS: 1000.0000 mg | ORAL_TABLET | Freq: Four times a day (QID) | ORAL | Status: DC | PRN

## 2023-03-15 MED ORDER — HYDROXYZINE HCL 25 MG PO TABS
25.0000 mg | ORAL_TABLET | Freq: Once | ORAL | Status: AC
  Administered 2023-03-15: 25 mg via ORAL
  Filled 2023-03-15: qty 1

## 2023-03-15 NOTE — Assessment & Plan Note (Signed)
-  Per ID, Ertapenem and Daptomycin -Add on Tylenol 650 mg every 6 as needed for back pai

## 2023-03-15 NOTE — Assessment & Plan Note (Addendum)
Hyponatremia improved to 139.  -Renal/carb modified diet as tolerated

## 2023-03-15 NOTE — Assessment & Plan Note (Addendum)
Hepatitis panel negative. Likely elevated in the setting of renal injury. -A.m. CMP --Consider RUQ

## 2023-03-15 NOTE — Inpatient Diabetes Management (Signed)
Inpatient Diabetes Program Recommendations  AACE/ADA: New Consensus Statement on Inpatient Glycemic Control (2015)  Target Ranges:  Prepandial:   less than 140 mg/dL      Peak postprandial:   less than 180 mg/dL (1-2 hours)      Critically ill patients:  140 - 180 mg/dL   Lab Results  Component Value Date   GLUCAP 147 (H) 03/15/2023   HGBA1C 7.6 (H) 03/14/2023    Review of Glycemic Control  Latest Reference Range & Units 03/14/23 11:57 03/14/23 16:54 03/14/23 22:09 03/15/23 06:05  Glucose-Capillary 70 - 99 mg/dL 301 (H) 601 (H) 093 (H) 147 (H)  (H): Data is abnormally high Diabetes history: Type 2 DM Outpatient Diabetes medications: Tresiba  20 units QHS Current orders for Inpatient glycemic control: Novolog 0-15 units TID  Inpatient Diabetes Program Recommendations:    Consider adding Semglee 8 units QD.   Thanks, Lujean Rave, MSN, RNC-OB Diabetes Coordinator [phone] (8a-5p)

## 2023-03-15 NOTE — Consult Note (Addendum)
[facility] Nephrology Consultation Note  Requesting MD: Celine Mans  Reason for consult: AKI on CKD   HPI:  Katelyn [name] is a 67 y.o. female with PMHx anxiety, chronic low back pain with multiple lumbar surgeries with osteomyelitis and discitis being treated with abx through PICC, type 2 diabetes, obesity, OSA, left bundle branch block, hypertension, hypercholesterolemia, CKD, CAD initially presenting to primary service with change in mentation, concerning for uremia as possible cause. Kidney function gradually worsening over time since admission.   Per patient and husband, patient had worsening kidney function in the past on vancomycin and was taken off of this. Had torsemide dosage increased outpatient recently as well. Most recently on cefepime and daptomycin for treatment. Denies NSAID use, change in urination. Currently has foley in place but without notable hematuria. Patient expressed anxiety around being in the hospital, especially when her husband is not around.   Currently, receiving treatment for chronic OM/discitis with Daptomycin and Ertapenem per ID. Was recently admitted for this in May and had spinal stabilization and nerve stimulator removal on 5/13 with NSGY. Initially, was placed on cefepime, but this was discontinued after she presented with mentation changes and confusion about 3 days ago.    Creat  Date/Time Value Ref Range Status  02/08/2023 02:22 AM 1.76 (H) 0.50 - 1.05 mg/dL Final   Creatinine, Ser  Date/Time Value Ref Range Status  03/15/2023 04:24 AM 3.70 (H) 0.44 - 1.00 mg/dL Final  16/07/9603 54:09 PM 3.63 (H) 0.44 - 1.00 mg/dL Final  81/19/1478 29:56 AM 3.84 (H) 0.44 - 1.00 mg/dL Final  21/30/8657 84:69 PM 4.06 (H) 0.44 - 1.00 mg/dL Final  62/95/2841 32:44 AM 1.31 (H) 0.44 - 1.00 mg/dL Final  10/11/7251 66:44 AM 1.63 (H) 0.44 - 1.00 mg/dL Final  03/47/4259 56:38 PM 2.26 (H) 0.44 - 1.00 mg/dL Final  75/64/3329 51:88 AM 2.18 (H) 0.44 - 1.00  mg/dL Final  41/66/0630 16:01 AM 2.03 (H) 0.44 - 1.00 mg/dL Final  09/32/3557 32:20 AM 1.53 (H) 0.44 - 1.00 mg/dL Final  25/42/7062 37:62 AM 1.43 (H) 0.44 - 1.00 mg/dL Final  83/15/1761 60:73 AM 1.39 (H) 0.44 - 1.00 mg/dL Final  71/03/2693 85:46 AM 1.56 (H) 0.44 - 1.00 mg/dL Final  27/12/5007 38:18 AM 1.88 (H) 0.44 - 1.00 mg/dL Final  29/93/7169 67:89 AM 1.98 (H) 0.44 - 1.00 mg/dL Final  38/07/1750 02:58 AM 1.07 (H) 0.44 - 1.00 mg/dL Final  52/77/8242 35:36 AM 1.01 (H) 0.44 - 1.00 mg/dL Final  14/43/1540 08:67 AM 1.01 (H) 0.44 - 1.00 mg/dL Final  61/95/0932 67:12 AM 1.01 (H) 0.44 - 1.00 mg/dL Final  45/80/9983 38:25 AM 0.89 0.44 - 1.00 mg/dL Final  05/39/7673 41:93 PM 1.03 (H) 0.44 - 1.00 mg/dL Final  79/11/4095 35:32 AM 1.22 (H) 0.57 - 1.00 mg/dL Final  99/24/2683 41:96 PM 1.09 (H) 0.57 - 1.00 mg/dL Final  22/29/7989 21:19 AM 1.01 (H) 0.44 - 1.00 mg/dL Final  41/74/0814 48:18 AM 1.13 (H) 0.44 - 1.00 mg/dL Final  56/31/4970 26:37 AM 1.11 (H) 0.44 - 1.00 mg/dL Final  85/88/5027 74:12 AM 1.16 (H) 0.44 - 1.00 mg/dL Final  87/86/7672 09:47 PM 1.26 (H) 0.44 - 1.00 mg/dL Final  09/62/8366 29:47 PM 1.24 (H) 0.57 - 1.00 mg/dL Final  65/46/5035 46:56 AM 1.10 (H) 0.57 - 1.00 mg/dL Final  81/27/5170 01:74 AM 1.26 (H) 0.57 - 1.00 mg/dL Final  94/49/6759 16:38 PM 1.39 (H) 0.57 - 1.00 mg/dL Final  46/65/9935 70:17 PM 1.25 (H) 0.57 -  1.00 mg/dL Final  54/06/8118 14:78 AM 1.07 (H) 0.44 - 1.00 mg/dL Final  29/56/2130 86:57 AM 1.12 (H) 0.44 - 1.00 mg/dL Final  84/69/6295 28:41 PM 1.49 (H) 0.44 - 1.00 mg/dL Final  32/44/0102 72:53 PM 0.80 0.50 - 1.10 mg/dL Final  66/44/0347 42:59 AM 1.02 0.50 - 1.10 mg/dL Final  56/38/7564 33:29 AM 0.85 0.4 - 1.2 mg/dL Final     PMHx:   Past Medical History:  Diagnosis Date   Anxiety    Arthritis    Bursitis of right hip    CAD (coronary artery disease)    Cardiac catheterization June 2014 in [place] - 50% circumflex stenosis   Chest pain, neg MI, stable  CAD non obstructive on cath 10/05/20 10/04/2020   Chronic diastolic heart failure (HCC) 08/20/2017   Chronic kidney disease, stage 3 (HCC)    does not see nephrologist   Chronic low back pain without sciatica 03/14/2016   CKD (chronic kidney disease), stage III (HCC) 10/07/2020   Complication of anesthesia    Cough 04/19/2017   Overview:  Last Assessment & Plan:  Formatting of this note may be different from the original. Cough - ? ACE related with AR triggers   Plan  Patient Instructions  Discuss with your primary doctor that lisinopril pain, need making your cough worse. May use Mucinex DM twice daily as needed for cough and congestion Zyrtec 10 mg at bedtime as needed for drainage Saline nasal spray as needed. Lab tests today Activity as tolerated. Follow with Dr. Craige Cotta in 3-4 months and As needed   Please contact office for sooner follow up if symptoms do not improve or worsen or seek emergency care    Depression    Dyspnea    with exertion   " lazy lung" - per  Dr Craige Cotta from back issues- 06/2016   Elevated liver enzymes 12/05/2016   Essential hypertension    GERD (gastroesophageal reflux disease)    Gout 03/14/2016   Greater trochanteric bursitis of right hip 02/02/2012   H/O hiatal hernia    Heart murmur    History of blood transfusion 2016   History of esophageal stricture 10/07/2020   History of kidney stones    Hypercholesterolemia    Hypertensive heart disease with heart failure (HCC) 01/01/2017   Hypoxia 10/07/2020   Iliotibial band syndrome of right side 02/02/2012   Iron deficiency anemia due to chronic blood loss 03/14/2016   LBBB (left bundle branch block) 01/01/2017   Left bundle branch block    Leg weakness 10/07/2020   Lumbar stenosis    Meralgia paraesthetica 12/05/2016   Mild CAD 11/24/2015   Morbid obesity (HCC) 10/07/2020   Neuropathy    OSA (obstructive sleep apnea) 05/24/2016   Overview:  Managed PULM   PONV (postoperative nausea and vomiting)    "no N/V with patch"    Restless leg syndrome    S/P lumbar laminectomy 11/26/2015   S/P lumbar spinal fusion 08/29/2016   Tinnitus 12/05/2016   Type 2 diabetes mellitus (HCC)    UTI (urinary tract infection) 10/07/2020    Past Surgical History:  Procedure Laterality Date   ABDOMINAL HYSTERECTOMY  1983   APPENDECTOMY  Age 11   BACK SURGERY     FIRST LUMBAR FUSION/ SURGERY APRIL 2012 AND FUSION WITH INSTRUMENTATION SEPT 2012   CARDIAC CATHETERIZATION     x 2   CARPAL TUNNEL RELEASE     bil   CHOLECYSTECTOMY  1990's   COLONOSCOPY  12/12/2017   Colonic polyp status post polypectomy. Mild sigmoid diverticulosis. Otherwise normal colonoscopy to terminal ileum.   CYSTO EXTRACTION KIDNEY STONES     ESOPHAGOGASTRODUODENOSCOPY  12/12/2017   Small hiatal hernia. Mild gastritis. Status post esophageal dilatation.   EXCISION/RELEASE BURSA HIP  02/02/2012   Procedure: EXCISION/RELEASE BURSA HIP;  Surgeon: Jacki Cones, MD;  Location: [facility];  Service: Orthopedics;  Laterality: Right;  Right Hip Bursectomy   EYE SURGERY Bilateral    cataracts   IR THORACIC DISC ASPIRATION W/IMG GUIDE  02/14/2023   KIDNEY STONE SURGERY  2008   LAMINECTOMY WITH POSTERIOR LATERAL ARTHRODESIS LEVEL 4 N/A 02/19/2023   Procedure: THORACIC TEN-LUMBAR TWO INSTRUMENTED FUSION;  Surgeon: Tia Alert, MD;  Location: [facility] OR;  Service: Neurosurgery;  Laterality: N/A;   Left knee surgery x 2  1996   reconstruction   LUMBAR DISC SURGERY  08/2016   LUMBAR LAMINECTOMY/DECOMPRESSION MICRODISCECTOMY Right 11/26/2015   Procedure: Extraforaminal Microdiscectomy  - Lumbar two-three- right;  Surgeon: Tia Alert, MD;  Location: [facility];  Service: Neurosurgery;  Laterality: Right;  right    LUMBAR WOUND DEBRIDEMENT N/A 10/25/2016   Procedure: Lumbar wound revision;  Surgeon: Tia Alert, MD;  Location: [facility] OR;  Service: Neurosurgery;  Laterality: N/A;  Lumbar wound revision   Right shoulder surgery  2010   spur   RIGHT/LEFT HEART CATH AND  CORONARY ANGIOGRAPHY N/A 10/05/2020   Procedure: RIGHT/LEFT HEART CATH AND CORONARY ANGIOGRAPHY;  Surgeon: Swaziland, Peter M, MD;  Location: [facility] INVASIVE CV LAB;  Service: Cardiovascular;  Laterality: N/A;   SPINAL CORD STIMULATOR REMOVAL  02/19/2023   Procedure: LUMBAR SPINAL CORD STIMULATOR REMOVAL;  Surgeon: Tia Alert, MD;  Location: [facility] OR;  Service: Neurosurgery;;    Family Hx:  Family History  Problem Relation Age of Onset   Lung cancer Father        smoked   Hypertension Father    Stroke Father    Heart failure Mother    Bone cancer Sister    Asthma Sister     Social History:  reports that she has never smoked. She has never used smokeless tobacco. She reports current alcohol use. She reports that she does not use drugs.  Allergies:  Allergies  Allergen Reactions   Atorvastatin Nausea And Vomiting and Other (See Comments)    MYALGIAS   Talwin [Pentazocine] Other (See Comments)    headache   Gabapentin Swelling   Other Nausea Only    UNSPECIFIED Anesthesia    Medications: Prior to Admission medications   Medication Sig Start Date End Date Taking? Authorizing Provider  allopurinol (ZYLOPRIM) 100 MG tablet Take 100 mg by mouth at bedtime. 10/20/19  Yes [provider]  aspirin EC 81 MG tablet Take 81 mg by mouth daily. **presently on hold for surgery**   Yes [provider]  carvedilol (COREG) 6.25 MG tablet Take 1 tablet (6.25 mg total) by mouth 2 (two) times daily. Take 6.25 mg by mouth 2 (two) times daily. / Needs appointment for further refills 04/27/21  Yes Baldo Daub, MD  ceFEPime (MAXIPIME) IVPB Inject 2 g into the vein every 12 (twelve) hours. Indication:  Discitis/osteomyelitis  First Dose: Yes Last Day of Therapy:  04/03/23 Labs - Once weekly:  CBC/D and BMP, Labs - Every other week:  ESR and CRP Method of administration: IV Push Method of administration may be changed at the discretion of home infusion pharmacist based upon assessment of  the patient and/or caregiver's ability to self-administer the medication ordered. 02/22/23 04/03/23 Yes Dameron, Nolberto Hanlon, DO  daptomycin (CUBICIN) IVPB Inject 650 mg into the vein daily. Indication:  Discitis/osteomyelitis  First Dose: Yes Last Day of Therapy:  04/03/23 Labs - Once weekly:  CBC/D, BMP, and CPK Labs - Every other week:  ESR and CRP Method of administration: IV Push Method of administration may be changed at the discretion of home infusion pharmacist based upon assessment of the patient and/or caregiver's ability to self-administer the medication ordered. 02/22/23 04/03/23 Yes Dameron, Nolberto Hanlon, DO  DULoxetine (CYMBALTA) 60 MG capsule Take 60 mg by mouth at bedtime.    Yes [provider]  HYDROcodone-acetaminophen (NORCO/VICODIN) 5-325 MG tablet Take 1 tablet by mouth every 6 (six) hours as needed for moderate pain.   Yes [provider]  insulin aspart protamine - aspart (NOVOLOG 70/30 MIX) (70-30) 100 UNIT/ML FlexPen Inject 10-12 Units into the skin 2 (two) times daily with a meal. 01/12/23  Yes [provider]  mirtazapine (REMERON) 15 MG tablet Take 30 mg by mouth at bedtime.   Yes [provider]  Multiple Vitamins-Minerals (HAIR SKIN AND NAILS FORMULA) TABS Take 1 tablet by mouth daily. 10/17/18  Yes [provider]  pantoprazole (PROTONIX) 40 MG tablet Take 1 tablet by mouth once daily 07/31/22  Yes Munley, Iline Oven, MD  pramipexole (MIRAPEX) 1 MG tablet Take 1 mg by mouth at bedtime.   Yes [provider]  pregabalin (LYRICA) 75 MG capsule Take 75 mg by mouth at bedtime. 09/24/21  Yes [provider]  torsemide (DEMADEX) 20 MG tablet Take 1 tablet (20 mg total) by mouth 2 (two) times daily. Patient taking differently: Take 40 mg by mouth once. 01/08/23  Yes Baldo Daub, MD  TRESIBA FLEXTOUCH 200 UNIT/ML FlexTouch Pen Inject 20 Units into the skin at bedtime. 02/22/23  Yes Dameron, Nolberto Hanlon, DO  nitroGLYCERIN (NITROSTAT) 0.4  MG SL tablet Place 0.4 mg under the tongue every 5 (five) minutes as needed for chest pain.  Patient not taking: Reported on 03/13/2023    [provider]    I have reviewed the patient's current medications.  Labs:  Results for orders placed or performed during the hospital encounter of 03/13/23 (from the past 48 hour(s))  Urinalysis, Routine w reflex microscopic -Urine, Clean Catch     Status: Abnormal   Collection Time: 03/13/23  7:27 PM  Result Value Ref Range   Color, Urine STRAW (A) YELLOW   APPearance CLEAR CLEAR   Specific Gravity, Urine 1.005 1.005 - 1.030   pH 6.0 5.0 - 8.0   Glucose, UA NEGATIVE NEGATIVE mg/dL   Hgb urine dipstick MODERATE (A) NEGATIVE   Bilirubin Urine NEGATIVE NEGATIVE   Ketones, ur NEGATIVE NEGATIVE mg/dL   Protein, ur 30 (A) NEGATIVE mg/dL   Nitrite NEGATIVE NEGATIVE   Leukocytes,Ua NEGATIVE NEGATIVE   RBC / HPF 0-5 0 - 5 RBC/hpf   WBC, UA 0-5 0 - 5 WBC/hpf   Bacteria, UA FEW (A) NONE SEEN   Squamous Epithelial / HPF 0-5 0 - 5 /HPF   Mucus PRESENT    Amorphous Crystal PRESENT     Comment: Performed at [facility] Lab, [address] [place], [place] [address]  Magnesium     Status: None   Collection Time: 03/13/23  7:27 PM  Result Value Ref Range   Magnesium 1.7 1.7 - 2.4 mg/dL    Comment: Performed at [facility],  [address], [place], [place] [address]  CK     Status: Abnormal   Collection Time: 03/13/23  7:27 PM  Result Value Ref Range   Total CK 498 (H) 38 - 234 U/L    Comment: Performed at [facility] Lab, [address], [place], [place] [address]  Sodium, urine, random     Status: None   Collection Time: 03/14/23  1:04 AM  Result Value Ref Range   Sodium, Ur 39 mmol/L    Comment: Performed at [facility], [address], [place], [place] [address]  Osmolality, urine     Status: Abnormal   Collection Time: 03/14/23  1:04 AM  Result Value Ref Range   Osmolality, Ur 225 (L) 300 - 900 mOsm/kg    Comment:  Performed at [facility] Lab, [address], [place], [place] [address]  Comprehensive metabolic panel     Status: Abnormal   Collection Time: 03/14/23  1:20 AM  Result Value Ref Range   Sodium 132 (L) 135 - 145 mmol/L   Potassium 3.5 3.5 - 5.1 mmol/L   Chloride 97 (L) 98 - 111 mmol/L   CO2 21 (L) 22 - 32 mmol/L   Glucose, Bld 78 70 - 99 mg/dL    Comment: Glucose reference range applies only to samples taken after fasting for at least 8 hours.   BUN 95 (H) 8 - 23 mg/dL   Creatinine, Ser 2.95 (H) 0.44 - 1.00 mg/dL   Calcium 8.4 (L) 8.9 - 10.3 mg/dL   Total Protein 6.7 6.5 - 8.1 g/dL   Albumin 1.9 (L) 3.5 - 5.0 g/dL   AST 621 (H) 15 - 41 U/L   ALT 86 (H) 0 - 44 U/L   Alkaline Phosphatase 245 (H) 38 - 126 U/L   Total Bilirubin 0.7 0.3 - 1.2 mg/dL   GFR, Estimated 12 (L) >60 mL/min    Comment: (NOTE) Calculated using the CKD-EPI Creatinine Equation (2021)    Anion gap 14 5 - 15    Comment: Performed at [facility] Lab, [address], [place], [place] [address]  Osmolality     Status: Abnormal   Collection Time: 03/14/23  1:20 AM  Result Value Ref Range   Osmolality 316 (H) 275 - 295 mOsm/kg    Comment: Performed at [facility] Lab, [address] [place], [place] [address]  CBC with Differential/Platelet     Status: Abnormal   Collection Time: 03/14/23  1:20 AM  Result Value Ref Range   WBC 4.9 4.0 - 10.5 K/uL   RBC 2.70 (L) 3.87 - 5.11 MIL/uL   Hemoglobin 7.9 (L) 12.0 - 15.0 g/dL   HCT 62.9 (L) 52.8 - 41.3 %   MCV 90.0 80.0 - 100.0 fL   MCH 29.3 26.0 - 34.0 pg   MCHC 32.5 30.0 - 36.0 g/dL   RDW 24.4 01.0 - 27.2 %   Platelets 93 (L) 150 - 400 K/uL    Comment: Immature Platelet Fraction may be clinically indicated, consider ordering this additional test [id number] REPEATED TO VERIFY    nRBC 0.0 0.0 - 0.2 %   Neutrophils Relative % 50 %   Neutro Abs 2.4 1.7 - 7.7 K/uL   Lymphocytes Relative 27 %   Lymphs Abs 1.4 0.7 - 4.0 K/uL   Monocytes Relative 13 %    Monocytes Absolute 0.7 0.1 - 1.0 K/uL   Eosinophils Relative 9 %   Eosinophils Absolute 0.4 0.0 -  0.5 K/uL   Basophils Relative 1 %   Basophils Absolute 0.1 0.0 - 0.1 K/uL   Immature Granulocytes 0 %   Abs Immature Granulocytes 0.01 0.00 - 0.07 K/uL    Comment: Performed at [facility] Lab, [address] [place], [place] [address]  Hemoglobin A1c     Status: Abnormal   Collection Time: 03/14/23  1:20 AM  Result Value Ref Range   Hgb A1c MFr Bld 7.6 (H) 4.8 - 5.6 %    Comment: (NOTE)         Prediabetes: 5.7 - 6.4         Diabetes: >6.4         Glycemic control for adults with diabetes: <7.0    Mean Plasma Glucose 171 mg/dL    Comment: (NOTE) Performed At: [facility] [address] [place], [place] [id number] Jolene Schimke MD [id number]   Magnesium     Status: Abnormal   Collection Time: 03/14/23  1:20 AM  Result Value Ref Range   Magnesium 1.6 (L) 1.7 - 2.4 mg/dL    Comment: Performed at [facility] Lab, [address], [place], [place] [address]  Glucose, capillary     Status: None   Collection Time: 03/14/23  7:44 AM  Result Value Ref Range   Glucose-Capillary 77 70 - 99 mg/dL    Comment: Glucose reference range applies only to samples taken after fasting for at least 8 hours.  Glucose, capillary     Status: Abnormal   Collection Time: 03/14/23 11:57 AM  Result Value Ref Range   Glucose-Capillary 178 (H) 70 - 99 mg/dL    Comment: Glucose reference range applies only to samples taken after fasting for at least 8 hours.  Renal function panel     Status: Abnormal   Collection Time: 03/14/23  2:09 PM  Result Value Ref Range   Sodium 132 (L) 135 - 145 mmol/L   Potassium 3.9 3.5 - 5.1 mmol/L   Chloride 97 (L) 98 - 111 mmol/L   CO2 23 22 - 32 mmol/L   Glucose, Bld 189 (H) 70 - 99 mg/dL    Comment: Glucose reference range applies only to samples taken after fasting for at least 8 hours.   BUN 91 (H) 8 - 23 mg/dL   Creatinine, Ser 7.84 (H) 0.44 - 1.00  mg/dL   Calcium 8.7 (L) 8.9 - 10.3 mg/dL   Phosphorus 4.6 2.5 - 4.6 mg/dL   Albumin 1.8 (L) 3.5 - 5.0 g/dL   GFR, Estimated 13 (L) >60 mL/min    Comment: (NOTE) Calculated using the CKD-EPI Creatinine Equation (2021)    Anion gap 12 5 - 15    Comment: Performed at [facility] Lab, [address] [place], [place] [address]  Hemoglobin and hematocrit, blood     Status: Abnormal   Collection Time: 03/14/23  2:09 PM  Result Value Ref Range   Hemoglobin 7.5 (L) 12.0 - 15.0 g/dL   HCT 52.8 (L) 41.3 - 24.4 %    Comment: Performed at [facility], [address] [place], [place] [address]  Hepatitis panel, acute     Status: None   Collection Time: 03/14/23  2:09 PM  Result Value Ref Range   Hepatitis B Surface Ag NON REACTIVE NON REACTIVE   HCV Ab NON REACTIVE NON REACTIVE    Comment: (NOTE) Nonreactive HCV antibody screen is consistent with no HCV infections,  unless recent infection is suspected or  other evidence exists to indicate HCV infection.     Hep A IgM NON REACTIVE NON REACTIVE   Hep B C IgM NON REACTIVE NON REACTIVE    Comment: Performed at [facility], [address], [place], [place] [address]  Prepare RBC (crossmatch)     Status: None   Collection Time: 03/14/23  4:16 PM  Result Value Ref Range   Order Confirmation      ORDER PROCESSED BY BLOOD BANK Performed at [facility], [address], [place], [place] [address]   Glucose, capillary     Status: Abnormal   Collection Time: 03/14/23  4:54 PM  Result Value Ref Range   Glucose-Capillary 182 (H) 70 - 99 mg/dL    Comment: Glucose reference range applies only to samples taken after fasting for at least 8 hours.  Type and screen [facility]     Status: None   Collection Time: 03/14/23  5:15 PM  Result Value Ref Range   ABO/RH(D) A POS    Antibody Screen NEG    Sample Expiration 03/17/2023,2359    Unit Number [id number]    Blood Component Type RED CELLS,LR    Unit  division 00    Status of Unit ISSUED,FINAL    Transfusion Status OK TO TRANSFUSE    Crossmatch Result      Compatible Performed at [facility], [address] [place], [place] [address]   Glucose, capillary     Status: Abnormal   Collection Time: 03/14/23 10:09 PM  Result Value Ref Range   Glucose-Capillary 174 (H) 70 - 99 mg/dL    Comment: Glucose reference range applies only to samples taken after fasting for at least 8 hours.   Comment 1 Notify RN    Comment 2 Document in Chart   Comprehensive metabolic panel     Status: Abnormal   Collection Time: 03/15/23  4:24 AM  Result Value Ref Range   Sodium 133 (L) 135 - 145 mmol/L   Potassium 3.9 3.5 - 5.1 mmol/L   Chloride 101 98 - 111 mmol/L   CO2 23 22 - 32 mmol/L   Glucose, Bld 153 (H) 70 - 99 mg/dL    Comment: Glucose reference range applies only to samples taken after fasting for at least 8 hours.   BUN 89 (H) 8 - 23 mg/dL   Creatinine, Ser 2.95 (H) 0.44 - 1.00 mg/dL   Calcium 8.6 (L) 8.9 - 10.3 mg/dL   Total Protein 6.5 6.5 - 8.1 g/dL   Albumin 1.9 (L) 3.5 - 5.0 g/dL   AST 90 (H) 15 - 41 U/L   ALT 74 (H) 0 - 44 U/L   Alkaline Phosphatase 257 (H) 38 - 126 U/L   Total Bilirubin 0.8 0.3 - 1.2 mg/dL   GFR, Estimated 13 (L) >60 mL/min    Comment: (NOTE) Calculated using the CKD-EPI Creatinine Equation (2021)    Anion gap 9 5 - 15    Comment: Performed at [facility] Lab, [address] [place], [place] [address]  Magnesium     Status: None   Collection Time: 03/15/23  4:24 AM  Result Value Ref Range   Magnesium 2.1 1.7 - 2.4 mg/dL    Comment: Performed at [facility], [address] [place], [place] [address]  Hemoglobin and hematocrit, blood     Status: Abnormal   Collection Time: 03/15/23  4:24 AM  Result Value Ref Range  Hemoglobin 8.4 (L) 12.0 - 15.0 g/dL   HCT 78.4 (L) 69.6 - 29.5 %    Comment: Performed at [facility] Lab, [address] [place], [place] [address]  Glucose, capillary      Status: Abnormal   Collection Time: 03/15/23  6:05 AM  Result Value Ref Range   Glucose-Capillary 147 (H) 70 - 99 mg/dL    Comment: Glucose reference range applies only to samples taken after fasting for at least 8 hours.   Comment 1 Notify RN    Comment 2 Document in Chart   Glucose, capillary     Status: Abnormal   Collection Time: 03/15/23 12:32 PM  Result Value Ref Range   Glucose-Capillary 203 (H) 70 - 99 mg/dL    Comment: Glucose reference range applies only to samples taken after fasting for at least 8 hours.     ROS:  Constitutional: negative for chills and fevers Respiratory: negative for cough and wheezing Genitourinary:negative for pain at foley site Neurological: negative negative for memory problems, seizures, and speech problems Physical Exam: Vitals:   03/15/23 0752 03/15/23 1308  BP: (!) 148/67 (!) 176/78  Pulse: 79 73  Resp: 14 12  Temp: 98.2 F (36.8 C) 98 F (36.7 C)  SpO2: 96% 97%     General exam: Appears calm and comfortable  Respiratory system: Clear to auscultation. Respiratory effort normal. No wheezing Cardiovascular system: S1 & S2 heard, RRR.  1+ pitting edema, no JVD Gastrointestinal system: Abdomen is nondistended, soft and nontender. Normal bowel sounds heard. Central nervous system: Alert and oriented. No focal neurological deficits. Extremities: Symmetric 5 x 5 power. Skin: No rashes, lesions or ulcers Psychiatry: Judgement and insight appear normal. Mood & affect appropriate.   Assessment/Plan: #Renal- AKI on CKD, Scr 3.63>3.7 (baseline 1-2). BUN 89, possibly related to offending medications/episode of hypotension on admission/chronic illness. For now, hold offending medications, held torsemide today, currently on Daptomycin (renal dosing) + Ertapenem with ID on board. No evidence of obstructive uropathy with foley in place (2.8L out//1.37mL/kg/hr). Hold nephrotoxic agents as able. Check RFP in AM, remove foley--assess strict I/Os.   #Encephalopathy, resolved, multifactorial -- Patient back to baseline mentation. No evidence of uremia today, continue serial monitoring of BUN, rest work up per primary. Discontinued Cefepime after initial presentation as likely contributory to this.   # Hypertension/volume/CHF - Slightly volume up with tenuous volume status. Holding torsemide today, Good UOP yesterday, normal oxygenation on RA. Daily monitoring. Systolics <150s. Stopping fluids.  #Anemia - normocytic, s/p 1uPRBCs Hgb 8.4. No active bleeding, could check iron studies    #Hyponatremia, Chronic: CTM, stable. #Osteomyelitis/Discitis: Per ID, Ertapenem and Daptomycin, renal dosing all medications  Rest per primary      Marcella Charlson 03/15/2023, 1:48 PM  [employer].

## 2023-03-15 NOTE — Assessment & Plan Note (Addendum)
S/p transfusion yesterday. Hgb improved to 8.4 (7.5).  -AM CBC

## 2023-03-15 NOTE — Assessment & Plan Note (Signed)
Neurologic exam continues to be reassuring. No signs of active infection. -Per ID recommendations Cefepime->Ertapenem, recs appreciated -Neurochecks every 4 hours -Delirium precautions -Fall precautions -A.m. CMP

## 2023-03-15 NOTE — Progress Notes (Addendum)
[facility]  Date of Admission:  03/13/2023     Total days of antibiotics 24         ASSESSMENT:  Ms. Duren's liver function testing is mildly improved with stable to slightly worsened renal function.  Latest CK levels 498. Mental status is slowly improving over the last 24 hours and question cefepime related or metabolic. Discussed plan of care to continue with current dose of Daptomycin and Ertapenem. For now will continue with previous end date of 04/03/23. Continue to monitor for improvements in mental status and hepatic and renal function. Remaining medical and supportive care per [facility].   PLAN:  Continue current dose of Daptomycin and Ertapenem  Monitor renal, hepatic and mental status improvements.  Remaining medical and supportive care per [facility].    I have personally spent 25  minutes involved in face-to-face and non-face-to-face activities for this patient on the day of the visit. Professional time spent includes the following activities: Preparing to see the patient (review of tests), Obtaining and/or reviewing separately obtained history (admission/discharge record), Performing a medically appropriate examination and/or evaluation , Ordering medications/tests/procedures, referring and communicating with other health care professionals, Documenting clinical information in the EMR, Independently interpreting results (not separately reported), Communicating results to the patient/family/caregiver, Counseling and educating the patient/family/caregiver and Care coordination (not separately reported).   Principal Problem:   AMS (altered mental status) Active Problems:   Chronic diastolic CHF (congestive heart failure) (HCC)   Type 2 diabetes mellitus with diabetic neuropathy, unspecified (HCC)   Anemia of chronic disease   Osteomyelitis (HCC)   Acute renal failure (ARF) (HCC)   Electrolyte imbalance   Elevated LFTs    aspirin EC  81  mg Oral Daily   carvedilol  6.25 mg Oral BID WC   Chlorhexidine Gluconate Cloth  6 each Topical Daily   insulin aspart  0-15 Units Subcutaneous TID WC   polyethylene glycol  17 g Oral Daily   sodium chloride flush  10-40 mL Intracatheter Q12H    SUBJECTIVE:  Afebrile overnight with no acute events. Requesting water to drink.   Allergies  Allergen Reactions   Atorvastatin Nausea And Vomiting and Other (See Comments)    MYALGIAS   Talwin [Pentazocine] Other (See Comments)    headache   Gabapentin Swelling   Other Nausea Only    UNSPECIFIED Anesthesia     Review of Systems: Review of Systems  Constitutional:  Negative for chills, fever and weight loss.  Respiratory:  Negative for cough, shortness of breath and wheezing.   Cardiovascular:  Negative for chest pain and leg swelling.  Gastrointestinal:  Negative for abdominal pain, constipation, diarrhea, nausea and vomiting.  Skin:  Negative for rash.      OBJECTIVE: Vitals:   03/15/23 0049 03/15/23 0349 03/15/23 0752 03/15/23 1308  BP: (!) 148/65 (!) 147/65 (!) 148/67 (!) 176/78  Pulse: 86 75 79 73  Resp: 18 18 14 12   Temp: 98.5 F (36.9 C) 98.1 F (36.7 C) 98.2 F (36.8 C) 98 F (36.7 C)  TempSrc: Oral Oral Oral Oral  SpO2: 99% 97% 96% 97%  Weight:  85.6 kg    Height:       Body mass index is 34.52 kg/m.  Physical Exam Constitutional:      General: She is not in acute distress.    Appearance: She is well-developed.  Cardiovascular:     Rate and Rhythm: Normal rate and regular rhythm.  Heart sounds: Normal heart sounds.  Pulmonary:     Effort: Pulmonary effort is normal.     Breath sounds: Normal breath sounds.  Skin:    General: Skin is warm and dry.  Neurological:     Mental Status: She is alert and oriented to person, place, and time.     Lab Results Lab Results  Component Value Date   WBC 4.9 03/14/2023   HGB 8.4 (L) 03/15/2023   HCT 24.8 (L) 03/15/2023   MCV 90.0 03/14/2023   PLT 93 (L)  03/14/2023    Lab Results  Component Value Date   CREATININE 3.70 (H) 03/15/2023   BUN 89 (H) 03/15/2023   NA 133 (L) 03/15/2023   K 3.9 03/15/2023   CL 101 03/15/2023   CO2 23 03/15/2023    Lab Results  Component Value Date   ALT 74 (H) 03/15/2023   AST 90 (H) 03/15/2023   ALKPHOS 257 (H) 03/15/2023   BILITOT 0.8 03/15/2023     Microbiology: No results found for this or any previous visit (from the past 240 hour(s)).   Marcos Eke, NP [facility]  03/15/2023  2:54 PM

## 2023-03-15 NOTE — Assessment & Plan Note (Signed)
Hyponatremia improved to 133.  -Renal/carb modified diet as tolerated   

## 2023-03-15 NOTE — Progress Notes (Signed)
   03/15/23 1308  Vitals  Temp 98 F (36.7 C)  Temp Source Oral  BP (!) 176/78  MAP (mmHg) 103  BP Location Left Arm  BP Method Automatic  Patient Position (if appropriate) Lying  Pulse Rate 73  Pulse Rate Source Monitor  ECG Heart Rate 72  Resp 12  MEWS COLOR  MEWS Score Color Green  Oxygen Therapy  SpO2 97 %  MEWS Score  MEWS Temp 0  MEWS Systolic 0  MEWS Pulse 0  MEWS RR 1  MEWS LOC 0  MEWS Score 1   MD made aware of BP, no new orders

## 2023-03-15 NOTE — Assessment & Plan Note (Deleted)
Neurologic exam continues to be reassuring. No signs of active infection. -Per ID recommendations Cefepime->Ertapenem, recs appreciated -Neurochecks every 4 hours -Delirium precautions -Fall precautions -A.m. CMP 

## 2023-03-15 NOTE — Assessment & Plan Note (Addendum)
Hepatitis panel negative. Possibly due to hypoperfusion in setting of dehydration, and compensated cirrhosis. LFTs and Alk phos improving. -A.m. CMP --Consider RUQ

## 2023-03-15 NOTE — Assessment & Plan Note (Addendum)
S/p transfusion x1. Hgb improved to 8.6 (8.4).  -AM CBC

## 2023-03-15 NOTE — Assessment & Plan Note (Addendum)
A1c in 11/2022 7.2. Appropriate hospital glucoses. -Hold home long-acting insulin -SSI and adjust long-acting insulin as appropriate -CBGs 3 times daily with meals and at night -A1c f/u

## 2023-03-15 NOTE — Assessment & Plan Note (Addendum)
Appears euvolemic on exam. -Hold Torsemide in setting of renal failure --Continue Coreg

## 2023-03-15 NOTE — Assessment & Plan Note (Signed)
CBG 78->77.  A1c in 11/2022 7.2. -Hold home long-acting insulin given decreased p.o. -SSI and adjust long-acting insulin as appropriate -CBGs 3 times daily with meals and at night -A1c f/u

## 2023-03-15 NOTE — Assessment & Plan Note (Addendum)
Cr worsened to 3.70 (3.63). Renally dose Daptomycin per ID. BUN continues to be elevated. Will discuss future steps with Nephrology. --Nephrology consulted, recs appreciated -Foley for strict I/O --Continue mIVF -CMP AM

## 2023-03-15 NOTE — Assessment & Plan Note (Addendum)
Cr improved to 3.25 (3.70). Continue monitoring. --Nephrology consulted, recs appreciated -Purewick --mIVF stopped -CMP AM

## 2023-03-15 NOTE — Evaluation (Signed)
Occupational Therapy Evaluation Patient Details Name: Katelyn [name] MRN: [id number] DOB: 1955/11/08 Today's Date: 03/15/2023   History of Present Illness Patient is a 67 y/o female admitted with AMS concerning for uremia. PMH: recent back surgery in May T11-12, T10-L2 posterioro fixation and removal of spinal stimulator; multiple lumbar surgeries, anxiety, chronic low back pain with multiple lumbar surgeries (most recent in Dec with spinal stimulator), DM2, obesity, OSA, LBBB, HTN, hypercholesterolemia, CKD, CAD.   Clinical Impression   PTA pt lives at home with her husband and required min A with ADL tasks due to recent back surgery. She had just started to receive HHOT (1 visit) before being admitted to the hospital. At baseline she uses a rollator in the home and an upright RW in the community. Pt and husband report cognition has significantly improved, however not "quite"at baseline. Able to ambulate @ 120 ft with min guard A @ RW level and requires min A with LB ADL tasks. Recommend continued follow up with HHOT after DC. Acute OT to follow to facilitate safe DC home.       Recommendations for follow up therapy are one component of a multi-disciplinary discharge planning process, led by the attending physician.  Recommendations may be updated based on patient status, additional functional criteria and insurance authorization.   Assistance Recommended at Discharge Frequent or constant Supervision/Assistance  Patient can return home with the following A little help with walking and/or transfers;A little help with bathing/dressing/bathroom;Assistance with cooking/housework;Direct supervision/assist for medications management;Direct supervision/assist for financial management;Assist for transportation    Functional Status Assessment  Patient has had a recent decline in their functional status and demonstrates the ability to make significant improvements in function in a reasonable and predictable  amount of time.  Equipment Recommendations  None recommended by OT    Recommendations for Other Services       Precautions / Restrictions Precautions Precautions: Back;Fall Precaution Booklet Issued: No Precaution Comments: able to verbalize cues Required Braces or Orthoses: Spinal Brace Spinal Brace: Lumbar corset      Mobility Bed Mobility Overal bed mobility: Needs Assistance Bed Mobility: Sidelying to Sit Rolling: Supervision              Transfers Overall transfer level: Needs assistance Equipment used: Rolling walker (2 wheels) Transfers: Sit to/from Stand Sit to Stand: Min guard                  Balance Overall balance assessment: Needs assistance, History of Falls   Sitting balance-Leahy Scale: Good       Standing balance-Leahy Scale: Fair                             ADL either performed or assessed with clinical judgement   ADL Overall ADL's : Needs assistance/impaired Eating/Feeding: Modified independent   Grooming: Set up;Standing   Upper Body Bathing: Set up;Sitting   Lower Body Bathing: Minimal assistance;Sit to/from stand   Upper Body Dressing : Set up   Lower Body Dressing: Minimal assistance;Sit to/from stand   Toilet Transfer: Minimal assistance;Ambulation;Rolling walker (2 wheels)   Toileting- Clothing Manipulation and Hygiene:  (foley catheter)       Functional mobility during ADLs: Minimal assistance;Rolling walker (2 wheels)       Vision Baseline Vision/History: 1 Wears glasses       Perception     Praxis      Pertinent Vitals/Pain Pain Assessment Pain Assessment: Faces Faces Pain  Scale: Hurts little more Pain Location: generalized; back Pain Descriptors / Indicators: Discomfort Pain Intervention(s): Limited activity within patient's tolerance     Hand Dominance     Extremity/Trunk Assessment Upper Extremity Assessment Upper Extremity Assessment: Generalized weakness (functional)    Lower Extremity Assessment Lower Extremity Assessment: Defer to PT evaluation   Cervical / Trunk Assessment Cervical / Trunk Assessment: Back Surgery;Other exceptions (obesity)   Communication Communication Communication: No difficulties   Cognition Arousal/Alertness: Awake/alert Behavior During Therapy: WFL for tasks assessed/performed Overall Cognitive Status: Impaired/Different from baseline Area of Impairment: Attention, Memory, Awareness                   Current Attention Level: Selective Memory:  (decreased working [employer] however improving)     Awareness: Emergent   General Comments: very talkative; tangential at times; husband states she is @ 75%better     General Comments       Exercises     Shoulder Instructions      Home Living Family/patient expects to be discharged to:: Private residence Living Arrangements: Spouse/significant other Available Help at Discharge: Family Type of Home: House Home Access: Ramped entrance     Home Layout: One level     Bathroom Shower/Tub: Producer, television/film/video: Handicapped height Bathroom Accessibility: Yes   Home Equipment: Agricultural consultant (2 wheels);Rollator (4 wheels);Wheelchair - manual;Shower seat;Grab bars - tub/shower;BSC/3in1;Hand held shower head;Adaptive equipment;Other (comment) (lift chair) Adaptive Equipment: Reacher;Sock aid;Long-handled shoe horn;Long-handled sponge    Has an adjustable bed    Prior Functioning/Environment Prior Level of Function : History of Falls (last six months);Independent/Modified Independent             Mobility Comments: usually ambulates with rollator or wc in home; uses upright walker in community; and 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 fell about a month ago reaching for flash light when she was going over threshold and walker folded ADLs Comments: Had just started getting HHOT (1 visit) PTA        OT Problem List: Decreased activity  tolerance;Pain;Decreased strength;Impaired balance (sitting and/or standing);Obesity;Decreased cognition      OT Treatment/Interventions: Self-care/ADL training;Energy conservation;DME and/or AE instruction;Therapeutic activities;Patient/family education;Balance training;Therapeutic exercise    OT Goals(Current goals can be found in the care plan section) Acute Rehab OT Goals Patient Stated Goal: to get better OT Goal Formulation: With patient/family Time For Goal Achievement: 03/29/23 Potential to Achieve Goals: Good  OT Frequency: Min 2X/week    Co-evaluation              AM-PAC OT "6 Clicks" Daily Activity     Outcome Measure Help from another person eating meals?: None Help from another person taking care of personal grooming?: A Little Help from another person toileting, which includes using toliet, bedpan, or urinal?: A Little Help from another person bathing (including washing, rinsing, drying)?: A Little Help from another person to put on and taking off regular upper body clothing?: A Little Help from another person to put on and taking off regular lower body clothing?: A Little 6 Click Score: 19   End of Session Equipment Utilized During Treatment: Gait belt;Rolling walker (2 wheels) Nurse Communication: Mobility status  Activity Tolerance: Patient tolerated treatment well Patient left: in chair;with call bell/phone within reach;with chair alarm set;with family/visitor present  OT Visit Diagnosis: Unsteadiness on feet (R26.81);Other abnormalities of gait and mobility (R26.89);Muscle weakness (generalized) (M62.81);Other symptoms and signs involving cognitive function Pain - part of body:  (generalized)  Time: 1610-9604 OT Time Calculation (min): 24 min Charges:  OT General Charges $OT Visit: 1 Visit OT Evaluation $OT Eval Moderate Complexity: 1 Mod OT Treatments $Self Care/Home Management : 8-22 mins  Luisa Dago, OT/L   Acute OT Clinical  Specialist Acute Rehabilitation Services Pager [phone] Office [phone]   [facility] 03/15/2023, 3:14 PM

## 2023-03-15 NOTE — Progress Notes (Addendum)
Daily Progress Note Intern Pager: [phone]  Patient name: DEEANDRA HELING Medical record number: [id number] Date of birth: [birth year] Age: 67 y.o. Gender: female  Primary Care Provider: Olive Bass, MD Consultants: Infectious Disease Code Status: DNR/DNI  Pt Overview and Major Events to Date:  -Admitted 03/14/23  Assessment and Plan: DENIKA BEHMER is a 67 y.o. admitted for AMS in the setting acute on chronic renal failure. AMS improved, ongoing evaluation of kidneys. Pertinent PMH/PSH includes T2DM, CAD, ACD, CHF, discitis (ongoing IV treatment via PICC), Lumbar stenosis s/p insertion and removal of stimulator, HTN, Compensated cirrhosis.   Active Hospital Problems   *AMS (altered mental status)           Neurologic exam continues to be reassuring. No           signs of active infection.-Per ID recommendations           Cefepime->Ertapenem, recs appreciated           -Neurochecks every 4 hours           -Delirium precautions           -Fall precautions           -A.m. CMP    Acute renal failure (ARF) (HCC)           Cr worsened to 3.70 (3.63). Renally dose           Daptomycin per ID. BUN continues to be elevated.           Will discuss future steps with Nephrology.           --Nephrology consulted, recs appreciated           -Foley for strict I/O           --Continue mIVF           -CMP AM                Electrolyte imbalance           Hyponatremia improved to 133.            -Renal/carb modified diet as tolerated                Elevated LFTs           Hepatitis panel negative. Possibly due to           hypoperfusion in setting of dehydration, and           compensated cirrhosis.           -A.m. CMP           --Consider RUQ                Osteomyelitis (HCC)           -Per ID, Ertapenem and Daptomycin           -Add on Tylenol 650 mg every 6 as needed for back           pai                Anemia of chronic disease           S/p transfusion yesterday. Hgb  improved to 8.4           (7.5).            -AM CBC    Chronic diastolic CHF (congestive heart failure) (HCC)  Per patient peripheral edema is chronic,           reassuringly no other signs of volume overload.           Continued caution with fluids. If drinking           appropriately today will discontinue.           -Hold Lasix in setting of renal failure           -Caution with maintenance IV fluids           --Continue Coreg                Type 2 diabetes mellitus with diabetic neuropathy, unspecified (HCC)           A1c in 11/2022 7.2.-Hold home long-acting insulin           given decreased p.o.           -SSI and adjust long-acting insulin as appropriate           -CBGs 3 times daily with meals and at night           -A1c f/u   Resolved Hospital Problems No resolved problems to display.     FEN/GI: Renal diet, fluids PPx: SCDs Dispo: Pending clinical improvement  Subjective:  NAEO.   Objective: Temp:  [98 F (36.7 C)-98.5 F (36.9 C)] 98 F (36.7 C) (06/06 1308) Pulse Rate:  [73-98] 78 (06/06 1400) Resp:  [10-18] 12 (06/06 1400) BP: (131-176)/(51-78) 170/70 (06/06 1400) SpO2:  [95 %-100 %] 98 % (06/06 1400) Weight:  [85.6 kg] 85.6 kg (06/06 0349) Physical Exam: General: NAD, resting comfortably in hospital bed Neuro: A&O x4 Cardiovascular: RRR, no murmurs, trace peripheral edema bilaterally Respiratory: normal WOB on RA, CTAB, no wheezes, ronchi or rales Abdomen: soft, NTTP, no rebound or guarding Extremities: Moving all 4 extremities equally   Laboratory: Most recent CBC Lab Results  Component Value Date   WBC 4.9 03/14/2023   HGB 8.4 (L) 03/15/2023   HCT 24.8 (L) 03/15/2023   MCV 90.0 03/14/2023   PLT 93 (L) 03/14/2023   Most recent BMP    Latest Ref Rng & Units 03/15/2023    4:24 AM  BMP  Glucose 70 - 99 mg/dL 161   BUN 8 - 23 mg/dL 89   Creatinine 0.96 - 1.00 mg/dL 0.45   Sodium 409 - 811 mmol/L 133   Potassium 3.5 - 5.1 mmol/L  3.9   Chloride 98 - 111 mmol/L 101   CO2 22 - 32 mmol/L 23   Calcium 8.9 - 10.3 mg/dL 8.6     Mag 2.1 ALT/AST - 90/74 Alkaline Phosphatase 257  Imaging/Diagnostic Tests: No new imaging.   Celine Mans, MD 03/15/2023, 4:36 PM  PGY-1, [facility] FPTS Intern pager: [phone], text pages welcome Secure chat group [facility] Teaching Service

## 2023-03-16 DIAGNOSIS — T847XXD Infection and inflammatory reaction due to other internal orthopedic prosthetic devices, implants and grafts, subsequent encounter: Secondary | ICD-10-CM | POA: Diagnosis not present

## 2023-03-16 DIAGNOSIS — E1169 Type 2 diabetes mellitus with other specified complication: Secondary | ICD-10-CM | POA: Diagnosis not present

## 2023-03-16 DIAGNOSIS — I5032 Chronic diastolic (congestive) heart failure: Secondary | ICD-10-CM | POA: Diagnosis not present

## 2023-03-16 DIAGNOSIS — E1140 Type 2 diabetes mellitus with diabetic neuropathy, unspecified: Secondary | ICD-10-CM | POA: Diagnosis not present

## 2023-03-16 DIAGNOSIS — M4625 Osteomyelitis of vertebra, thoracolumbar region: Secondary | ICD-10-CM | POA: Diagnosis not present

## 2023-03-16 DIAGNOSIS — N179 Acute kidney failure, unspecified: Secondary | ICD-10-CM | POA: Diagnosis not present

## 2023-03-16 LAB — COMPREHENSIVE METABOLIC PANEL
ALT: 65 U/L — ABNORMAL HIGH (ref 0–44)
AST: 72 U/L — ABNORMAL HIGH (ref 15–41)
Albumin: 2 g/dL — ABNORMAL LOW (ref 3.5–5.0)
Alkaline Phosphatase: 218 U/L — ABNORMAL HIGH (ref 38–126)
Anion gap: 16 — ABNORMAL HIGH (ref 5–15)
BUN: 81 mg/dL — ABNORMAL HIGH (ref 8–23)
CO2: 24 mmol/L (ref 22–32)
Calcium: 9.2 mg/dL (ref 8.9–10.3)
Chloride: 99 mmol/L (ref 98–111)
Creatinine, Ser: 3.25 mg/dL — ABNORMAL HIGH (ref 0.44–1.00)
GFR, Estimated: 15 mL/min — ABNORMAL LOW (ref >60)
Glucose, Bld: 136 mg/dL — ABNORMAL HIGH (ref 70–99)
Potassium: 4.2 mmol/L (ref 3.5–5.1)
Sodium: 139 mmol/L (ref 135–145)
Total Bilirubin: 0.6 mg/dL (ref 0.3–1.2)
Total Protein: 6.7 g/dL (ref 6.5–8.1)

## 2023-03-16 LAB — IRON AND TIBC
Iron: 32 ug/dL (ref 28–170)
Saturation Ratios: 10 % — ABNORMAL LOW (ref 10.4–31.8)
TIBC: 318 ug/dL (ref 250–450)
UIBC: 286 ug/dL

## 2023-03-16 LAB — CBC
HCT: 25.8 % — ABNORMAL LOW (ref 36.0–46.0)
Hemoglobin: 8.6 g/dL — ABNORMAL LOW (ref 12.0–15.0)
MCH: 29.4 pg (ref 26.0–34.0)
MCHC: 33.3 g/dL (ref 30.0–36.0)
MCV: 88.1 fL (ref 80.0–100.0)
Platelets: 93 10*3/uL — ABNORMAL LOW (ref 150–400)
RBC: 2.93 MIL/uL — ABNORMAL LOW (ref 3.87–5.11)
RDW: 15.3 % (ref 11.5–15.5)
WBC: 5.3 10*3/uL (ref 4.0–10.5)
nRBC: 0 % (ref 0.0–0.2)

## 2023-03-16 LAB — GLUCOSE, CAPILLARY
Glucose-Capillary: 137 mg/dL — ABNORMAL HIGH (ref 70–99)
Glucose-Capillary: 252 mg/dL — ABNORMAL HIGH (ref 70–99)
Glucose-Capillary: 203 mg/dL — ABNORMAL HIGH (ref 70–99)
Glucose-Capillary: 164 mg/dL — ABNORMAL HIGH (ref 70–99)

## 2023-03-16 LAB — FERRITIN: Ferritin: 138 ng/mL (ref 11–307)

## 2023-03-16 MED ORDER — ENOXAPARIN SODIUM 30 MG/0.3ML IJ SOSY
30.0000 mg | PREFILLED_SYRINGE | INTRAMUSCULAR | Status: DC
  Administered 2023-03-16 – 2023-03-18 (×3): 30 mg via SUBCUTANEOUS
  Filled 2023-03-16 (×3): qty 0.3

## 2023-03-16 NOTE — Progress Notes (Signed)
Subjective: No new complaints   Antibiotics:  Anti-infectives (From admission, onward)    Start     Dose/Rate Route Frequency Ordered Stop   03/14/23 1400  piperacillin-tazobactam (ZOSYN) IVPB 2.25 g  Status:  Discontinued        2.25 g 100 mL/hr over 30 Minutes Intravenous Every 8 hours 03/14/23 1021 03/14/23 1025   03/14/23 1400  DAPTOmycin (CUBICIN) 500 mg in sodium chloride 0.9 % IVPB        8 mg/kg  63.9 kg (Adjusted) 120 mL/hr over 30 Minutes Intravenous Every 48 hours 03/14/23 1021     03/14/23 1200  ertapenem (INVANZ) 500 mg in sodium chloride 0.9 % 50 mL IVPB        500 mg 100 mL/hr over 30 Minutes Intravenous Every 24 hours 03/14/23 1025         Medications: Scheduled Meds:  aspirin EC  81 mg Oral Daily   carvedilol  6.25 mg Oral BID WC   Chlorhexidine Gluconate Cloth  6 each Topical Daily   insulin aspart  0-15 Units Subcutaneous TID WC   polyethylene glycol  17 g Oral Daily   sodium chloride flush  10-40 mL Intracatheter Q12H   Continuous Infusions:  DAPTOmycin (CUBICIN) 500 mg in sodium chloride 0.9 % IVPB 500 mg (03/14/23 1343)   ertapenem Stopped (03/15/23 1308)   PRN Meds:.acetaminophen, lip balm, sodium chloride flush    Objective: Weight change: -1.3 kg  Intake/Output Summary (Last 24 hours) at 03/16/2023 1017 Last data filed at 03/16/2023 0629 Gross per 24 hour  Intake 910 ml  Output 2950 ml  Net -2040 ml   Blood pressure (!) 156/60, pulse 81, temperature 98.2 F (36.8 C), temperature source Oral, resp. rate 14, height 5\' 2"  (1.575 m), weight 84.3 kg, SpO2 97 %. Temp:  [98 F (36.7 C)-98.2 F (36.8 C)] 98.2 F (36.8 C) (06/07 0345) Pulse Rate:  [73-81] 81 (06/07 0345) Resp:  [12-16] 14 (06/07 0345) BP: (156-176)/(60-78) 156/60 (06/07 0345) SpO2:  [96 %-98 %] 97 % (06/07 0345) Weight:  [84.3 kg] 84.3 kg (06/07 0400)  Physical Exam: Physical Exam Constitutional:      General: She is not in acute distress.    Appearance: She  is well-developed. She is not diaphoretic.  HENT:     Head: Normocephalic and atraumatic.     Right Ear: External ear normal.     Left Ear: External ear normal.     Mouth/Throat:     Pharynx: No oropharyngeal exudate.  Eyes:     General: No scleral icterus.    Conjunctiva/sclera: Conjunctivae normal.     Pupils: Pupils are equal, round, and reactive to light.  Cardiovascular:     Rate and Rhythm: Normal rate and regular rhythm.  Pulmonary:     Effort: Pulmonary effort is normal. No respiratory distress.     Breath sounds: Normal breath sounds. No wheezing.  Abdominal:     General: Bowel sounds are normal. There is no distension.     Palpations: Abdomen is soft.     Tenderness: There is no abdominal tenderness.  Musculoskeletal:        General: No tenderness. Normal range of motion.  Lymphadenopathy:     Cervical: No cervical adenopathy.  Skin:    General: Skin is warm and dry.     Coloration: Skin is not pale.     Findings: No erythema or rash.  Neurological:     General: No  focal deficit present.     Mental Status: She is alert and oriented to person, place, and time.     Motor: No abnormal muscle tone.     Coordination: Coordination normal.  Psychiatric:        Mood and Affect: Mood normal.        Speech: Speech is delayed.        Behavior: Behavior normal.        Thought Content: Thought content normal.        Cognition and Memory: She exhibits impaired recent memory.        Judgment: Judgment normal.      CBC:    BMET Recent Labs    03/15/23 0424 03/16/23 0410  NA 133* 139  K 3.9 4.2  CL 101 99  CO2 23 24  GLUCOSE 153* 136*  BUN 89* 81*  CREATININE 3.70* 3.25*  CALCIUM 8.6* 9.2     Liver Panel  Recent Labs    03/15/23 0424 03/16/23 0410  PROT 6.5 6.7  ALBUMIN 1.9* 2.0*  AST 90* 72*  ALT 74* 65*  ALKPHOS 257* 218*  BILITOT 0.8 0.6       Sedimentation Rate No results for input(s): "ESRSEDRATE" in the last 72 hours. C-Reactive  Protein No results for input(s): "CRP" in the last 72 hours.  Micro Results: Recent Results (from the past 720 hour(s))  Aerobic/Anaerobic Culture w Gram Stain (surgical/deep wound)     Status: None   Collection Time: 02/14/23  1:00 PM   Specimen: Wound; Fine Needle Aspirate  Result Value Ref Range Status   Specimen Description WOUND  Final   Special Requests DISC SPACE T11 T12  Final   Gram Stain NO WBC SEEN NO ORGANISMS SEEN   Final   Culture   Final    No growth aerobically or anaerobically. Performed at [facility], [address], [place], [place] [address]    Report Status 02/19/2023 FINAL  Final  Fungus Culture With Stain     Status: None   Collection Time: 02/14/23  1:15 PM   Specimen: Intervertebral Disc  Result Value Ref Range Status   Fungus Stain Final report  Final   Fungus (Mycology) Culture Final report  Final    Comment: (NOTE) Performed At: [facility] [address] [place], [place] [id number] Jolene Schimke MD AO:[id number]    Fungal Source WOUND  Final    Comment: DISC SPACE T11,T12 Performed at [facility], [address], [place], [place] [address]   Fungus Culture Result     Status: None   Collection Time: 02/14/23  1:15 PM  Result Value Ref Range Status   Result 1 Comment  Final    Comment: (NOTE) KOH/Calcofluor preparation:  no fungus observed. Performed At: [facility] [address] [place], [place] [id number] Jolene Schimke MD NU:[id number]   Fungal organism reflex     Status: None   Collection Time: 02/14/23  1:15 PM  Result Value Ref Range Status   Fungal result 1 Comment  Final    Comment: (NOTE) No yeast or mold isolated after 4 weeks. Performed At: [facility] [address] [place], [place] [id number] Jolene Schimke MD LO:[id number]   Surgical pcr screen     Status: None   Collection Time: 02/19/23  8:35 AM   Specimen: Nasal Mucosa; Nasal Swab  Result Value Ref Range Status   MRSA, PCR  NEGATIVE NEGATIVE Final   Staphylococcus aureus NEGATIVE NEGATIVE  Final    Comment: (NOTE) The Xpert SA Assay (FDA approved for NASAL specimens in patients 75 years of age and older), is one component of a comprehensive surveillance program. It is not intended to diagnose infection nor to guide or monitor treatment. Performed at [facility], [address] [place], [place] [address]     Studies/Results: No results found.    Assessment/Plan:  INTERVAL HISTORY: Creatinine down to 3.25   Principal Problem:   AMS (altered mental status) Active Problems:   Chronic diastolic CHF (congestive heart failure) (HCC)   Type 2 diabetes mellitus with diabetic neuropathy, unspecified (HCC)   Anemia of chronic disease   Osteomyelitis (HCC)   Acute renal failure (ARF) (HCC)   Electrolyte imbalance   Elevated LFTs    Katelyn [name] is a 67 y.o. female with prior postoperative infection with Serratia in 2017 more recently T11-T12 discitis osteomyelitis and epidural phlegmon from T8-T9 and L3-L2 with spinal stimulator in place, status post surgery with unrevealing cultures status post vancomycin and cefepime complicated by vancomycin toxicity to her kidneys, then placed on Cipro and doxycycline prior to admission which showed progressive osteomyelitis and discitis requiring decompressive thoracic laminectomy with medial facetectomy T11-12 and posterior fixation T12-L2 and intertransverse arthrodesis T10 and L2 and removal of her spinal cord Storck cord stimulator.  Cultures were not done during that surgery so we had to go back with empiric therapy with daptomycin and cefepime.  She has now been admitted with acute renal failure and encephalopathy.  #1 Hardware associated discitis osteomyelitis:  Will continue with daptomycin though are awaiting her renal recovery to make final recommendations with regards to her dosing.  Will continue with ertapenem in place of the cefepime.  Plan will  be to complete the original course of therapy likely followed by some protracted oral empiric agents.  #2 ARF:  I would think that she had prerenal failure though her picture is fairly puzzling.  I have personally spent 52 minutes involved in face-to-face and non-face-to-face activities for this patient on the day of the visit. Professional time spent includes the following activities: Preparing to see the patient (review of tests), Obtaining and/or reviewing separately obtained history (admission/discharge record), Performing a medically appropriate examination and/or evaluation , Ordering medications/tests/procedures, referring and communicating with other health care professionals, Documenting clinical information in the EMR, Independently interpreting results (not separately reported), Communicating results to the patient/family/caregiver, Counseling and educating the patient/family/caregiver and Care coordination (not separately reported).    She is being worked up and managed by nephrology and primary team.  Dr. Drue Second is available this weekend for questions and we will make final recommendations next week.    LOS: 2 days   Acey Lav 03/16/2023, 10:17 AM

## 2023-03-16 NOTE — Progress Notes (Signed)
  [facility] Progress Note    Assessment/ Plan:   1) AKI on CKD IIIa, baseline serum creatinine level seems to be anywhere between 1.4-1.8 however on presentation on 6/4 the creatinine level was 4.06.  She also had altered mental status.  The cefepime was discontinued and now her mental status has significantly improved.  Imaging studies ruled out hydronephrosis.  UA pretty bland except minimal proteinuria. Acute component likely hemodynamically mediated in the setting of hypotension/multiple antibiotics use for infection concurrent with higher dose of torsemide used.  UA not concerning for AIN or GN.  Imaging studies ruled out obstruction.  Azotemia slowly improving with great urine output; likely in the diuretic phase of ATN.   Lungs have some rhonchi in the bases w/ only trace peripheral edema.   Continue daily lab and strict ins and out to monitor gradual renal recovery.    Uremia not causing AMS especially since her mental status significantly improved after stopping cefepime.  No indication for dialysis.   2) Acute metabolic encephalopathy: Discontinued cefepime.  Mental status improved.   3) Anemia: Received PRBC.  Recommend to check iron.  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   Objective:   BP (!) 156/60 (BP Location: Left Arm)   Pulse 81   Temp 98.2 F (36.8 C) (Oral)   Resp 14   Ht 5\' 2"  (1.575 m)   Wt 84.3 kg   SpO2 97%   BMI 33.99 kg/m   Intake/Output Summary (Last 24 hours) at 03/16/2023 1023 Last data filed at 03/16/2023 7829 Gross per 24 hour  Intake 910 ml  Output 2950 ml  Net -2040 ml   Weight change: -1.3 kg  Physical Exam: General exam: NAD Respiratory system: CTA b/l Cardiovascular system: RRR.  1+ pitting edema Gastrointestinal system: Abdomen is SNDNT +BS Central nervous system: Alert and oriented. No focal neurological deficits. Extremities:  Symmetric  Skin: No rashes, lesions or ulcers  Imaging: No results found.  Labs: BMET Recent Labs  Lab 03/13/23 1207 03/14/23 0120 03/14/23 1409 03/15/23 0424 03/16/23 0410  NA 129* 132* 132* 133* 139  K 3.9 3.5 3.9 3.9 4.2  CL 95* 97* 97* 101 99  CO2 20* 21* 23 23 24   GLUCOSE 184* 78 189* 153* 136*  BUN 95* 95* 91* 89* 81*  CREATININE 4.06* 3.84* 3.63* 3.70* 3.25*  CALCIUM 8.4* 8.4* 8.7* 8.6* 9.2  PHOS  --   --  4.6  --   --    CBC Recent Labs  Lab 03/13/23 1207 03/14/23 0120 03/14/23 1409 03/15/23 0424 03/16/23 0410  WBC 5.1 4.9  --   --  5.3  NEUTROABS 3.0 2.4  --   --   --   HGB 8.3* 7.9* 7.5* 8.4* 8.6*  HCT 25.7* 24.3* 22.6* 24.8* 25.8*  MCV 89.9 90.0  --   --  88.1  PLT 104* 93*  --   --  93*    Medications:     aspirin EC  81 mg Oral Daily   carvedilol  6.25 mg Oral BID WC   Chlorhexidine Gluconate Cloth  6 each Topical Daily   insulin aspart  0-15 Units Subcutaneous TID WC   polyethylene glycol  17 g Oral Daily   sodium chloride flush  10-40 mL Intracatheter Q12H      Paulene Floor, MD 03/16/2023, 10:23 AM

## 2023-03-16 NOTE — Progress Notes (Signed)
Daily Progress Note Intern Pager: [phone]  Patient name: Katelyn [name] Medical record number: [id number] Date of birth: 1956/06/06 Age: 67 y.o. Gender: female  Primary Care Provider: Olive Bass, MD Consultants: Infectious Disease, Nephrology Code Status: DNR/DNI  Pt Overview and Major Events to Date:  -Admitted 06/05/  Assessment and Plan: Katelyn [name] is a 67 y.o. admitted for AMS in the setting acute on chronic renal failure. AMS now resolved, ongoing evaluation of kidneys. Pertinent PMH/PSH includes T2DM, CAD, ACD, CHF, discitis (ongoing IV treatment via PICC), Lumbar stenosis s/p insertion and removal of stimulator, HTN, Compensated cirrhosis.  Active Hospital Problems   Acute renal failure (ARF) (HCC)           Cr improved to 3.25 (3.70). Continue monitoring.           --Nephrology consulted, recs appreciated           -Purewick           --mIVF stopped           -CMP AM                Electrolyte imbalance           Hyponatremia improved to 139.            -Renal/carb modified diet as tolerated                Elevated LFTs           Hepatitis panel negative. Possibly due to           hypoperfusion in setting of dehydration, and           compensated cirrhosis. LFTs and Alk phos           improving.           -A.m. CMP           --Consider RUQ                Osteomyelitis (HCC)           -Per ID, Ertapenem and Daptomycin           -Add on Tylenol 650 mg every 6 as needed for back           pai                Anemia of chronic disease           S/p transfusion x1. Hgb improved to 8.6 (8.4).            -AM CBC    Chronic diastolic CHF (congestive heart failure) (HCC)           Appears euvolemic on exam.           -Hold Torsemide in setting of renal failure           --Continue Coreg                Type 2 diabetes mellitus with diabetic neuropathy, unspecified (HCC)           A1c in 11/2022 7.2. Appropriate hospital glucoses.           -Hold home  long-acting insulin           -SSI and adjust long-acting insulin as appropriate           -CBGs 3 times daily with meals and at night           -  A1c f/u   Resolved Hospital Problems   *AMS (altered mental status)        Date Resolved: 03/16/2023      FEN/GI: Renal diet, fluids  PPx: SCDs Dispo: Pending clinical improvement  Subjective:  NAEO. Denies any pain or concerns. Feels she is back to baseline mentally. Feels swelling in feet is improved.  Objective: Temp:  [98 F (36.7 C)-98.2 F (36.8 C)] 98 F (36.7 C) (06/07 1109) Pulse Rate:  [73-81] 73 (06/07 1109) Resp:  [12-16] 13 (06/07 1109) BP: (156-176)/(60-78) 166/66 (06/07 1109) SpO2:  [96 %-100 %] 100 % (06/07 1109) Weight:  [84.3 kg] 84.3 kg (06/07 0400) Physical Exam: General: NAD, resting comfortably in hospital bed Neuro: Awake and alert Cardiovascular: RRR, no murmurs, trace peripheral edema Respiratory: normal WOB on RA, CTAB, no wheezes, ronchi or rales Abdomen: soft, NTTP, no rebound or guarding Extremities: Moving all 4 extremities equally   Laboratory: Most recent CBC Lab Results  Component Value Date   WBC 5.3 03/16/2023   HGB 8.6 (L) 03/16/2023   HCT 25.8 (L) 03/16/2023   MCV 88.1 03/16/2023   PLT 93 (L) 03/16/2023   Most recent BMP    Latest Ref Rng & Units 03/16/2023    4:10 AM  BMP  Glucose 70 - 99 mg/dL 161   BUN 8 - 23 mg/dL 81   Creatinine 0.96 - 1.00 mg/dL 0.45   Sodium 409 - 811 mmol/L 139   Potassium 3.5 - 5.1 mmol/L 4.2   Chloride 98 - 111 mmol/L 99   CO2 22 - 32 mmol/L 24   Calcium 8.9 - 10.3 mg/dL 9.2     Imaging/Diagnostic Tests: No new imaging.  Celine Mans, MD 03/16/2023, 11:56 AM  PGY-1, [facility] FPTS Intern pager: [phone], text pages welcome Secure chat group [facility] Teaching Service

## 2023-03-16 NOTE — Inpatient Diabetes Management (Signed)
Inpatient Diabetes Program Recommendations  AACE/ADA: New Consensus Statement on Inpatient Glycemic Control (2015)  Target Ranges:  Prepandial:   less than 140 mg/dL      Peak postprandial:   less than 180 mg/dL (1-2 hours)      Critically ill patients:  140 - 180 mg/dL   Lab Results  Component Value Date   GLUCAP 252 (H) 03/16/2023   HGBA1C 7.6 (H) 03/14/2023    Review of Glycemic Control  Diabetes history: Type 2 DM Outpatient Diabetes medications: Tresiba  20 units QHS Current orders for Inpatient glycemic control: Novolog 0-15 units TID   Inpatient Diabetes Program Recommendations:     Please Consider: -Add Semglee 8 units QD.  -Decrease Novolog correction to 0-9 units tid, 0-5 units hs  Thank you, Darel Hong E. Tayshun Gappa, RN, MSN, CDE  Diabetes Coordinator Inpatient Glycemic Control Team Team Pager [phone] (8am-5pm) 03/16/2023 1:22 PM

## 2023-03-16 NOTE — Evaluation (Signed)
Physical Therapy Evaluation Patient Details Name: Katelyn [name] MRN: [id number] DOB: 26-Sep-1956 Today's Date: 03/16/2023  History of Present Illness  Patient is a 67 y/o female admitted with AMS concerning for uremia. PMH: recent back surgery in May T11-12, T10-L2 posterioro fixation and removal of spinal stimulator; multiple lumbar surgeries, anxiety, chronic low back pain with multiple lumbar surgeries (most recent in Dec with spinal stimulator), DM2, obesity, OSA, LBBB, HTN, hypercholesterolemia, CKD, CAD.  Clinical Impression  Pt presents with admitting diagnosis above. Pt today was supervision for all mobility today and able to maintain spinal precautions. Pt presents at or near baseline mobility. Pt has no further acute PT needs and will be signing off. Re consult PT if mobility status changes. Recommend pt resume HHPT upon DC.       Recommendations for follow up therapy are one component of a multi-disciplinary discharge planning process, led by the attending physician.  Recommendations may be updated based on patient status, additional functional criteria and insurance authorization.  Follow Up Recommendations       Assistance Recommended at Discharge Intermittent Supervision/Assistance  Patient can return home with the following  Assist for transportation;Help with stairs or ramp for entrance;A little help with walking and/or transfers    Equipment Recommendations None recommended by PT  Recommendations for Other Services       Functional Status Assessment Patient has had a recent decline in their functional status and demonstrates the ability to make significant improvements in function in a reasonable and predictable amount of time.     Precautions / Restrictions Precautions Precautions: Back;Fall Precaution Booklet Issued: No Precaution Comments: able to verbalize cues Required Braces or Orthoses: Spinal Brace Spinal Brace: Lumbar corset Restrictions Weight Bearing  Restrictions: No      Mobility  Bed Mobility Overal bed mobility: Needs Assistance Bed Mobility: Supine to Sit, Sit to Supine     Supine to sit: Supervision Sit to supine: Supervision   General bed mobility comments: Able maintain back precautions.    Transfers Overall transfer level: Needs assistance Equipment used: Rollator (4 wheels) Transfers: Sit to/from Stand Sit to Stand: Supervision                Ambulation/Gait Ambulation/Gait assistance: Supervision Gait Distance (Feet): 450 Feet Assistive device: Rollator (4 wheels) Gait Pattern/deviations: Step-through pattern, Decreased step length - right, Decreased step length - left, Trunk flexed Gait velocity: decreased     General Gait Details: 1 seated rest break  Stairs            Wheelchair Mobility    Modified Rankin (Stroke Patients Only)       Balance Overall balance assessment: Mild deficits observed, not formally tested                                           Pertinent Vitals/Pain Pain Assessment Pain Assessment: No/denies pain    Home Living Family/patient expects to be discharged to:: Private residence Living Arrangements: Spouse/significant other Available Help at Discharge: Family Type of Home: House Home Access: Ramped entrance       Home Layout: One level Home Equipment: Agricultural consultant (2 wheels);Rollator (4 wheels);Wheelchair - manual;Shower seat;Grab bars - tub/shower;BSC/3in1;Hand held shower head;Adaptive equipment;Other (comment) Additional Comments: Just started HHPT and HHOT this week    Prior Function Prior Level of Function : Independent/Modified Independent  Mobility Comments: usually ambulates with rollator or wc in home; uses upright walker in community; and 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 Just started HHOT     Hand Dominance   Dominant Hand: Right    Extremity/Trunk Assessment   Upper Extremity Assessment Upper Extremity Assessment: Overall WFL for tasks assessed    Lower Extremity Assessment Lower Extremity Assessment: Overall WFL for tasks assessed    Cervical / Trunk Assessment Cervical / Trunk Assessment: Back Surgery;Other exceptions Cervical / Trunk Exceptions: Lumbar corset  Communication   Communication: No difficulties (Hyperverbose)  Cognition Arousal/Alertness: Awake/alert Behavior During Therapy: WFL for tasks assessed/performed Overall Cognitive Status: Impaired/Different from baseline Area of Impairment: Attention, Memory, Awareness                   Current Attention Level: Selective Memory: Decreased short-term memory     Awareness: Emergent   General Comments: very talkative; tangential at times; Pt states that she feels she is 80-85% better.        General Comments General comments (skin integrity, edema, etc.): HR up to 121 during ambulation    Exercises     Assessment/Plan    PT Assessment All further PT needs can be met in the next venue of care  PT Problem List Decreased strength;Decreased mobility;Pain;Decreased activity tolerance       PT Treatment Interventions      PT Goals (Current goals can be found in the Care Plan section)       Frequency       Co-evaluation               AM-PAC PT "6 Clicks" Mobility  Outcome Measure Help needed turning from your back to your side while in a flat bed without using bedrails?: A Little Help needed moving from lying on your back to sitting on the side of a flat bed without using bedrails?: A Little Help needed moving to and from a bed to a chair (including a wheelchair)?: A Little Help needed standing up from a chair using your arms (e.g., wheelchair or bedside chair)?: A Little Help needed to walk in hospital room?: A Little Help needed  climbing 3-5 steps with a railing? : A Little 6 Click Score: 18    End of Session Equipment Utilized During Treatment: Back brace Activity Tolerance: Patient tolerated treatment well Patient left: in bed;with call bell/phone within reach Nurse Communication: Mobility status PT Visit Diagnosis: Other abnormalities of gait and mobility (R26.89);Pain    Time: 1112-1150 PT Time Calculation (min) (ACUTE ONLY): 38 min   Charges:   PT Evaluation $PT Eval Moderate Complexity: 1 Mod PT Treatments $Gait Training: 8-22 mins $Therapeutic Activity: 8-22 mins        Katelyn [name], PT, DPT Acute Rehab Services [phone]   Katelyn [name] 03/16/2023, 12:35 PM

## 2023-03-17 DIAGNOSIS — M869 Osteomyelitis, unspecified: Secondary | ICD-10-CM

## 2023-03-17 DIAGNOSIS — N179 Acute kidney failure, unspecified: Secondary | ICD-10-CM | POA: Diagnosis not present

## 2023-03-17 LAB — COMPREHENSIVE METABOLIC PANEL
ALT: 70 U/L — ABNORMAL HIGH (ref 0–44)
AST: 90 U/L — ABNORMAL HIGH (ref 15–41)
Albumin: 2.1 g/dL — ABNORMAL LOW (ref 3.5–5.0)
Alkaline Phosphatase: 262 U/L — ABNORMAL HIGH (ref 38–126)
Anion gap: 12 (ref 5–15)
BUN: 66 mg/dL — ABNORMAL HIGH (ref 8–23)
CO2: 26 mmol/L (ref 22–32)
Calcium: 8.9 mg/dL (ref 8.9–10.3)
Chloride: 102 mmol/L (ref 98–111)
Creatinine, Ser: 2.58 mg/dL — ABNORMAL HIGH (ref 0.44–1.00)
GFR, Estimated: 20 mL/min — ABNORMAL LOW (ref >60)
Glucose, Bld: 144 mg/dL — ABNORMAL HIGH (ref 70–99)
Potassium: 4.1 mmol/L (ref 3.5–5.1)
Sodium: 140 mmol/L (ref 135–145)
Total Bilirubin: 0.7 mg/dL (ref 0.3–1.2)
Total Protein: 7.1 g/dL (ref 6.5–8.1)

## 2023-03-17 LAB — GLUCOSE, CAPILLARY
Glucose-Capillary: 128 mg/dL — ABNORMAL HIGH (ref 70–99)
Glucose-Capillary: 204 mg/dL — ABNORMAL HIGH (ref 70–99)
Glucose-Capillary: 179 mg/dL — ABNORMAL HIGH (ref 70–99)
Glucose-Capillary: 224 mg/dL — ABNORMAL HIGH (ref 70–99)

## 2023-03-17 LAB — CBC
HCT: 25.3 % — ABNORMAL LOW (ref 36.0–46.0)
Hemoglobin: 8.3 g/dL — ABNORMAL LOW (ref 12.0–15.0)
MCH: 28.8 pg (ref 26.0–34.0)
MCHC: 32.8 g/dL (ref 30.0–36.0)
MCV: 87.8 fL (ref 80.0–100.0)
Platelets: 90 10*3/uL — ABNORMAL LOW (ref 150–400)
RBC: 2.88 MIL/uL — ABNORMAL LOW (ref 3.87–5.11)
RDW: 15.2 % (ref 11.5–15.5)
WBC: 4.8 10*3/uL (ref 4.0–10.5)
nRBC: 0 % (ref 0.0–0.2)

## 2023-03-17 LAB — PHOSPHORUS: Phosphorus: 4.1 mg/dL (ref 2.5–4.6)

## 2023-03-17 MED ORDER — OXYCODONE HCL 5 MG PO TABS
2.5000 mg | ORAL_TABLET | Freq: Once | ORAL | Status: AC
  Administered 2023-03-17: 2.5 mg via ORAL
  Filled 2023-03-17: qty 1

## 2023-03-17 MED ORDER — DAPTOMYCIN IV (FOR PTA / DISCHARGE USE ONLY)
500.0000 mg | INTRAVENOUS | 0 refills | Status: AC
Start: 2023-03-17 — End: 2023-04-04

## 2023-03-17 MED ORDER — ERTAPENEM IV (FOR PTA / DISCHARGE USE ONLY): 500.0000 mg | INTRAVENOUS | 0 refills | Status: AC

## 2023-03-17 NOTE — Progress Notes (Signed)
[facility] Progress Note    Assessment/ Plan:   1) AKI on CKD IIIa, baseline serum creatinine level seems to be anywhere between 1.4-1.8 however on presentation on 6/4 the creatinine level was 4.06.  She also had altered mental status.  The cefepime was discontinued and now her mental status has significantly improved.  Imaging studies ruled out hydronephrosis.  UA pretty bland except minimal proteinuria. Acute component likely hemodynamically mediated in the setting of hypotension/multiple antibiotics use for infection concurrent with higher dose of torsemide used.  UA not concerning for AIN or GN.  Imaging studies ruled out obstruction.  Azotemia slowly improving with great urine output; likely in the diuretic phase of ATN.   Lungs have some rhonchi in the bases w/ only trace peripheral edema.   Continue daily lab and strict ins and out to monitor gradual renal recovery.    Uremia not causing AMS especially since her mental status significantly improved after stopping cefepime.  No indication for dialysis.  Renal function cont to improve with good UOP; signing off at this time; please reconsult as needed.   2) Acute metabolic encephalopathy: Discontinued cefepime.  Mental status improved.   3) Anemia: Received PRBC.  Recommend to check iron.  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 Spouse bedside, doing well with PT.   Objective:   BP (!) 171/67 (BP Location: Left Arm)   Pulse 70   Temp 98.1 F (36.7 C) (Oral)   Resp 11   Ht 5\' 2"  (1.575 m)   Wt 83.7 kg   SpO2 97%   BMI 33.75 kg/m   Intake/Output Summary (Last 24 hours) at 03/17/2023 1034 Last data filed at 03/17/2023 0500 Gross per 24 hour  Intake 310 ml  Output 800 ml  Net -490 ml   Weight change: -0.6 kg  Physical Exam: General exam: NAD Respiratory system: CTA b/l Cardiovascular system: RRR.  1+ pitting  edema Gastrointestinal system: Abdomen is SNDNT +BS Central nervous system: Alert and oriented. No focal neurological deficits. Extremities: Symmetric  Skin: No rashes, lesions or ulcers  Imaging: No results found.  Labs: BMET Recent Labs  Lab 03/13/23 1207 03/14/23 0120 03/14/23 1409 03/15/23 0424 03/16/23 0410 03/17/23 0416  NA 129* 132* 132* 133* 139 140  K 3.9 3.5 3.9 3.9 4.2 4.1  CL 95* 97* 97* 101 99 102  CO2 20* 21* 23 23 24 26   GLUCOSE 184* 78 189* 153* 136* 144*  BUN 95* 95* 91* 89* 81* 66*  CREATININE 4.06* 3.84* 3.63* 3.70* 3.25* 2.58*  CALCIUM 8.4* 8.4* 8.7* 8.6* 9.2 8.9  PHOS  --   --  4.6  --   --  4.1   CBC Recent Labs  Lab 03/13/23 1207 03/14/23 0120 03/14/23 1409 03/15/23 0424 03/16/23 0410 03/17/23 0416  WBC 5.1 4.9  --   --  5.3 4.8  NEUTROABS 3.0 2.4  --   --   --   --   HGB 8.3* 7.9* 7.5* 8.4* 8.6* 8.3*  HCT 25.7* 24.3* 22.6* 24.8* 25.8* 25.3*  MCV 89.9 90.0  --   --  88.1 87.8  PLT 104* 93*  --   --  93* 90*    Medications:     aspirin EC  81 mg Oral Daily   carvedilol  6.25 mg Oral BID WC   Chlorhexidine Gluconate Cloth  6 each Topical Daily   enoxaparin (LOVENOX) injection  30 mg Subcutaneous Q24H   insulin aspart  0-15 Units Subcutaneous TID WC   polyethylene glycol  17 g Oral Daily   sodium chloride flush  10-40 mL Intracatheter Q12H      Paulene Floor, MD 03/17/2023, 10:34 AM

## 2023-03-17 NOTE — Assessment & Plan Note (Addendum)
Cr improved to 2.58 (3.25). Continued improvement. If improving tomorrow expect discharge. --Nephrology consulted, recs appreciated -Purewick -CMP AM

## 2023-03-17 NOTE — Assessment & Plan Note (Signed)
A1c in 11/2022 7.2. Appropriate hospital glucoses. -Hold home long-acting insulin -SSI and adjust long-acting insulin as appropriate -CBGs 3 times daily with meals and at night -A1c f/u 

## 2023-03-17 NOTE — Assessment & Plan Note (Signed)
-  Per ID, Ertapenem and Daptomycin -Tylenol 650 mg every 6 as needed for back pain

## 2023-03-17 NOTE — Assessment & Plan Note (Signed)
Appears euvolemic on exam. -Hold Torsemide in setting of renal failure --Continue Coreg   

## 2023-03-17 NOTE — Assessment & Plan Note (Signed)
S/p transfusion x1. Hgb stable 8.3 (8.6).  -AM CBC

## 2023-03-17 NOTE — Assessment & Plan Note (Addendum)
Stable. Alk phos continues to be elevated but stable. -Consider RUQ [place] -Outpatient biliary evaluation

## 2023-03-17 NOTE — Progress Notes (Signed)
Daily Progress Note Intern Pager: [phone]  Patient name: Katelyn [name] Medical record number: [id number] Date of birth: [birth year] Age: 67 y.o. Gender: female  Primary Care Provider: Olive Bass, MD Consultants: Infectious Disease, Nephrology Code Status: DNR/DNI  Pt Overview and Major Events to Date:  -Admitted 06/05  Assessment and Plan: Katelyn [name] is a 67 y.o. admitted for AMS in the setting acute on chronic renal failure. AMS now resolved, renal failure improving. Pertinent PMH/PSH includes T2DM, CAD, ACD, CHF, discitis (ongoing IV treatment via PICC), Lumbar stenosis s/p insertion and removal of stimulator, HTN, Compensated cirrhosis.   Active Hospital Problems   Acute renal failure (ARF) (HCC)           Cr improved to 2.58 (3.25). Continued improvement.           --Nephrology consulted, recs appreciated           -Purewick           -CMP AM    Elevated LFTs           Stable. Alk phos continued to elevated but stable.           -Consider RUQ [place]           -Outpatient biliary evaluation    Osteomyelitis (HCC)           -Per ID, Ertapenem and Daptomycin           -Tylenol 650 mg every 6 as needed for back pain                Anemia of chronic disease           S/p transfusion x1. Hgb stable 8.3 (8.6).            -AM CBC    Chronic diastolic CHF (congestive heart failure) (HCC)           Appears euvolemic on exam.           -Hold Torsemide in setting of renal failure           --Continue Coreg    Type 2 diabetes mellitus with diabetic neuropathy, unspecified (HCC)           A1c in 11/2022 7.2. Appropriate hospital glucoses.           -Hold home long-acting insulin           -SSI and adjust long-acting insulin as appropriate           -CBGs 3 times daily with meals and at night           -A1c f/u               Resolved Hospital Problems   *AMS (altered mental status)        Date Resolved: 03/16/2023    Electrolyte imbalance        Date Resolved:  03/17/2023     FEN/GI: Renal diet PPx: Lovenox Dispo: Pending Nephrology recs, and Home Health set up  Subjective:  NAEO. Feels swelling in her legs is down the most today. Would like to go home.  Objective: Temp:  [98 F (36.7 C)-98.2 F (36.8 C)] 98.1 F (36.7 C) (06/08 0749) Pulse Rate:  [70-82] 70 (06/08 0749) Resp:  [10-16] 11 (06/08 0749) BP: (148-173)/(31-83) 171/67 (06/08 0749) SpO2:  [96 %-100 %] 97 % (06/08 0749) Weight:  [83.7 kg] 83.7 kg (06/08 0400) Physical Exam: General: NAD, sitting comfortably in hospital  chair Neuro: A&O Cardiovascular: RRR, no murmurs, no peripheral edema Respiratory: normal WOB on RA, CTAB, no wheezes, ronchi or rales Abdomen: soft, NTTP, no rebound or guarding Extremities: Moving all 4 extremities equally   Laboratory: Most recent CBC Lab Results  Component Value Date   WBC 4.8 03/17/2023   HGB 8.3 (L) 03/17/2023   HCT 25.3 (L) 03/17/2023   MCV 87.8 03/17/2023   PLT 90 (L) 03/17/2023   Most recent BMP    Latest Ref Rng & Units 03/17/2023    4:16 AM  BMP  Glucose 70 - 99 mg/dL 161   BUN 8 - 23 mg/dL 66   Creatinine 0.96 - 1.00 mg/dL 0.45   Sodium 409 - 811 mmol/L 140   Potassium 3.5 - 5.1 mmol/L 4.1   Chloride 98 - 111 mmol/L 102   CO2 22 - 32 mmol/L 26   Calcium 8.9 - 10.3 mg/dL 8.9    Phos 4.1  Imaging/Diagnostic Tests: No new imaging.  Celine Mans, MD 03/17/2023, 8:53 AM  PGY-1, [facility] Family Medicine FPTS Intern pager: [phone], text pages welcome Secure chat group [facility] Teaching Service

## 2023-03-18 DIAGNOSIS — M869 Osteomyelitis, unspecified: Secondary | ICD-10-CM | POA: Diagnosis not present

## 2023-03-18 DIAGNOSIS — D638 Anemia in other chronic diseases classified elsewhere: Secondary | ICD-10-CM | POA: Diagnosis not present

## 2023-03-18 DIAGNOSIS — N179 Acute kidney failure, unspecified: Secondary | ICD-10-CM | POA: Diagnosis not present

## 2023-03-18 LAB — BASIC METABOLIC PANEL
Anion gap: 10 (ref 5–15)
BUN: 52 mg/dL — ABNORMAL HIGH (ref 8–23)
CO2: 27 mmol/L (ref 22–32)
Calcium: 9 mg/dL (ref 8.9–10.3)
Chloride: 102 mmol/L (ref 98–111)
Creatinine, Ser: 2.16 mg/dL — ABNORMAL HIGH (ref 0.44–1.00)
GFR, Estimated: 25 mL/min — ABNORMAL LOW (ref >60)
Glucose, Bld: 158 mg/dL — ABNORMAL HIGH (ref 70–99)
Potassium: 3.9 mmol/L (ref 3.5–5.1)
Sodium: 139 mmol/L (ref 135–145)

## 2023-03-18 LAB — GLUCOSE, CAPILLARY
Glucose-Capillary: 168 mg/dL — ABNORMAL HIGH (ref 70–99)
Glucose-Capillary: 202 mg/dL — ABNORMAL HIGH (ref 70–99)

## 2023-03-18 MED ORDER — OXYCODONE HCL 5 MG PO TABS: 2.5000 mg | ORAL_TABLET | Freq: Four times a day (QID) | ORAL | 0 refills | Status: DC | PRN

## 2023-03-18 NOTE — Discharge Instructions (Addendum)
Dear Katelyn [name],   Thank you so much for allowing [place] to be part of your care!  You were admitted to [facility] for mental status changes that we concluded was likely a result of the cefepime that you are on.  We stopped this medication and have you now on daptomycin and ertapenem through your PICC line.  Home health will come help you with these medications.  Follow-up with infectious disease as scheduled.  Continue with your home insulin dosages that you had prior to admission to the hospital, follow-up with PCP within the next week for further evaluation.  Continue to monitor your sugars and call PCP if you notice any low sugars.  We will hold your home diuretics for now given your kidney function.  Please hold torsemide.  We will have your PCP assess your fluid status on next visit.   POST-HOSPITAL & CARE INSTRUCTIONS Please let PCP/Specialists know of any changes that were made.  Please see medications section of this packet for any medication changes.   DOCTOR'S APPOINTMENT & FOLLOW UP CARE INSTRUCTIONS  Future Appointments  Date Time Provider Department Center  03/28/2023  3:00 PM Veryl Speak, FNP [facility]  04/09/2023  3:00 PM Flossie Dibble, NP [facility] None    RETURN PRECAUTIONS: Worsening confusion Altered mental status  Dizziness  Swelling in the legs  Chest pain  Shortness of breath   Take care and be well!  Family Medicine Teaching Service  [place]  [facility]  [address] [place], [place] [address] [phone]

## 2023-03-18 NOTE — TOC Transition Note (Signed)
Transition of Care [facility]) - CM/SW Discharge Note   Patient Details  Name: Katelyn [name] MRN: [id number] Date of Birth: 1956/03/04  Transition of Care [facility]) CM/SW Contact:  Ronny Bacon, RN Phone Number: 03/18/2023, 1:52 PM   Clinical Narrative:   Spoke with patient and family at bedside. Patient made aware that [facility] will make a home visit tomorrow around 2pm and that her IV medications, daptomycin and ertapenem, will be delivered to the home tomorrow through [facility]. Patient confirms that she has adequate support at home to assist with IV ABT infusions. Patient is currently receiving today's IV ABT doses prior to dc home.    Final next level of care: Home w Home Health Services Barriers to Discharge: No Barriers Identified   Patient Goals and CMS Choice      Discharge Placement                         Discharge Plan and Services Additional resources added to the After Visit Summary for                                       Social Determinants of Health (SDOH) Interventions SDOH Screenings   Food Insecurity: Patient Unable To Answer (03/14/2023)  Housing: Low Risk  (03/14/2023)  Recent Concern: Housing - High Risk (03/14/2023)  Transportation Needs: No Transportation Needs (03/14/2023)  Utilities: Not At Risk (03/14/2023)  Depression (PHQ2-9): Low Risk  (02/08/2023)  Tobacco Use: Low Risk  (03/13/2023)     Readmission Risk Interventions     No data to display

## 2023-03-18 NOTE — Discharge Summary (Addendum)
Family Medicine Teaching [facility] Discharge Summary  Patient name: Katelyn [name] Medical record number: [id number] Date of birth: [birth year] Age: 67 y.o. Gender: female Date of Admission: 03/13/2023  Date of Discharge: 03/18/23 Admitting Physician: Evette Georges, MD  Primary Care Provider: Olive Bass, MD Consultants: Infectious Disease, Nephrology   Indication for Hospitalization: Mentation changes  Discharge Diagnoses/Problem List:  Principal Problem for Admission: Change in Mentation/Confusion  Other Problems addressed during stay:  Active Problems:   Acute renal failure (ARF) (HCC)   Chronic diastolic CHF (congestive heart failure) (HCC)   Type 2 diabetes mellitus with diabetic neuropathy, unspecified (HCC)   Anemia of chronic disease   Osteomyelitis (HCC)   Elevated LFTs    Brief Hospital Course:  Katelyn [name] is a 67 y.o. female who was admitted to the [facility] Medicine Teaching Service at [facility] for altered mental status. Hospital course is outlined below by problem.   Altered mental status Presented with tangential speech and history of visual/auditory hallucinations per husband.  BUN found to be 95, creatinine elevated to 4.06.  Question decreased metabolism of opioids, Lyrica, Remeron, and cefepime.  CT head negative for acute abnormality.  EEG negative. ID consulted who initially recommended holding cefepime and daptomycin. Improved with d/c of cefepime. Ertapenem and Daptomycin continued at home with PICC.   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  UA with moderate hemoglobin, protein 30, few bacteria.  Had history of decreased p.o. intake in setting of altered mental status.  CT abdomen pelvis without abnormality.  Recent renal ultrasound normal.  Bladder scans with mild retention, Foley placed given she was on IV fluids.  With fluids, renal function was improved by discharge.  Elevated LFTs Presented with AST 148, ALT 97, alk  phos 271.  History of elevations to more mild degree of February 2024.  Does have history of cirrhosis, though no heavy alcohol use per report) as evidenced by CT abdomen pelvis.  Question some element of medication effect and hypovolemia causing acute worsening.  Maintenance IV fluids were started.  By discharge, LFTs had improved.  Electrolyte imbalance Presented hyponatremic to 129, appears she has been intermittently hyponatremic since 11/2022. Hypochloremic to 95 which was present at last admission. Consider worsening in setting of decreased p.o. intake.  Serum osmolality 316, urine osmolality 225, and urine sodium 39.  Urine studies likely difficult to interpret in setting of uremia.  By discharge, lytes normal.  Osteomyelitis Diagnosed at last admission, discharged in May 2024 on IV daptomycin and cefepime through PICC.  Take antibiotics as prescribed.  Initially held antibiotics per ID recommendation on admission.  Daptomycin was only restarted with cefepime switched to ertapenem.  Last dose of medication 04/03/2023.  Gave 15 tabs of oxycodone for chronic pain, needs to obtain any further rx from PCP.   Anemia of chronic disease Hemoglobin 8.3, up from 8.1 on 02/22/2023.  Required transfusion at last admission for hemoglobin 6.8.  Hemoglobin over admission 7.5, likely due to dilution; one unit packed red blood cells administered given history of CAD.  By discharge, hemoglobin was 8.3.  Type 2 diabetes mellitus with diabetic neuropathy, unspecified (HCC) Discharged in May 2024 on 20 units long-acting insulin with aspart scale with meals.  Patient has not been taking in normal amounts of p.o. at home. A1c in 11/2022 7.2.  She was started on sliding scale insulin given decreased p.o.  By discharge, she was prescribed her home dosage that she was  on prior to admission to take at home.  Chronic diastolic CHF (congestive heart failure) (HCC) Presented with 1+ pitting edema to bilateral lower  extremities with no crackles appreciated pulmonary exam.  Last echo 01/2023 with mild LVH, LVEF 40 to 45%, elevated LA and LV end-diastolic pressures, mild diffuse hypokinesia, mild TVR, pulmonary artery systolic pressure moderate to severely dilated at 62 mmHg. Fluids given as above, Lasix held given renal failure.  At discharge, continued holding diuretics, will have her see PCP for evaluation of need.  Other conditions that were chronic and stable: CAD (continue home aspirin), OSA (not on BiPAP anymore after titration study), hypertension (continue home Coreg), HLD (not on meds)  Issues for follow up: Consider restarting rosuvastatin 10 mg given comorbidities Evaluate chronic thrombocytopenia Follow up on elevated LFTs, may need GGT/RUQ U/S Anemia, iron 32--may need iron supplementation. UTD on colonoscopy?  Needs CMP/CBC on follow up, may need outpatient nephrology if kidney function worsens  Daptomycin and Ertapenem at home, ensure sees ID in follow up  Monitor CBGs, alter insulin as needed  Evaluate mental status    Disposition: Stable   Discharge Condition: Home with [facility]  Discharge Exam:  Vitals:   03/18/23 0446 03/18/23 0729  BP: (!) 163/68 (!) 160/83  Pulse: 78 79  Resp: 18 19  Temp: 98.1 F (36.7 C) 98.3 F (36.8 C)  SpO2: 98% 98%   General: Alert and oriented in no apparent distress Heart: Regular rate and rhythm with no murmurs appreciated Lungs: CTA bilaterally, no wheezing Abdomen: Bowel sounds present, no abdominal pain Skin: Warm and dry, PICC in place Extremities: No lower extremity edema Neurologically intact   Significant Procedures: Has PICC line   Significant Labs and Imaging:  Recent Labs  Lab 03/17/23 0416  WBC 4.8  HGB 8.3*  HCT 25.3*  PLT 90*   Recent Labs  Lab 03/17/23 0416 03/18/23 0703  NA 140 139  K 4.1 3.9  CL 102 102  CO2 26 27  GLUCOSE 144* 158*  BUN 66* 52*  CREATININE 2.58* 2.16*  CALCIUM 8.9 9.0  PHOS 4.1  --   ALKPHOS 262*   --   AST 90*  --   ALT 70*  --   ALBUMIN 2.1*  --    CT head 03/13/23: IMPRESSION: No acute intracranial abnormality.  CT Abd/pelvis:  IMPRESSION: 1. No acute abnormality identified in the abdomen or pelvis. 2. Moderate colonic stool load. 3. Cirrhosis. 4. Similar endplate destruction at T11-12 consistent with discitis-osteomyelitis.  Results/Tests Pending at Time of Discharge: None   Discharge Medications:  Allergies as of 03/18/2023       Reactions   Atorvastatin Nausea And Vomiting, Other (See Comments)   MYALGIAS   Talwin [pentazocine] Other (See Comments)   headache   Gabapentin Swelling   Other Nausea Only   UNSPECIFIED Anesthesia   Cefepime Other (See Comments)   Presumed AMS/neurotoxicity in the setting of AKI        Medication List     STOP taking these medications    allopurinol 100 MG tablet Commonly known as: ZYLOPRIM   ceFEPime  IVPB Commonly known as: MAXIPIME   HYDROcodone-acetaminophen 5-325 MG tablet Commonly known as: NORCO/VICODIN   torsemide 20 MG tablet Commonly known as: DEMADEX       TAKE these medications    aspirin EC 81 MG tablet Take 81 mg by mouth daily. **presently on hold for surgery**   carvedilol 6.25 MG tablet Commonly known as: COREG Take 1  tablet (6.25 mg total) by mouth 2 (two) times daily. Take 6.25 mg by mouth 2 (two) times daily. / Needs appointment for further refills   daptomycin  IVPB Commonly known as: CUBICIN Inject 500 mg into the vein every other day for 18 days. Indication:  Discitis/osteo First Dose: Yes Last Day of Therapy: 04/03/23 Labs - Once weekly:  CBC/D, CPK, ESR and CRP Labs - Twice weekly: BMP Method of administration: IV Push Pull PICC line at the completion of IV antibiotic therapy Method of administration may be changed at the discretion of home infusion pharmacist based upon assessment of the patient and/or caregiver's ability to self-administer the medication ordered. What changed:   how much to take when to take this additional instructions   DULoxetine 60 MG capsule Commonly known as: CYMBALTA Take 60 mg by mouth at bedtime.   ertapenem  IVPB Commonly known as: INVANZ Inject 500 mg into the vein daily for 18 days. Indication:  Discitis/osteo First Dose: Yes Last Day of Therapy:  04/03/23 Labs - Once weekly:  CBC/D, ESR and CRP Labs - Twice weekly: BMP Method of administration: Mini-Bag Plus / Gravity Pull PICC line at the completion of IV antibiotic therapy Method of administration may be changed at the discretion of home infusion pharmacist based upon assessment of the patient and/or caregiver's ability to self-administer the medication ordered.   Hair Skin and Nails Formula Tabs Take 1 tablet by mouth daily.   insulin aspart protamine - aspart (70-30) 100 UNIT/ML FlexPen Commonly known as: NOVOLOG 70/30 MIX Inject 10-12 Units into the skin 2 (two) times daily with a meal.   mirtazapine 15 MG tablet Commonly known as: REMERON Take 30 mg by mouth at bedtime.   nitroGLYCERIN 0.4 MG SL tablet Commonly known as: NITROSTAT Place 0.4 mg under the tongue every 5 (five) minutes as needed for chest pain.   oxyCODONE 5 MG immediate release tablet Commonly known as: Oxy IR/ROXICODONE Take 0.5 tablets (2.5 mg total) by mouth every 6 (six) hours as needed for severe pain. What changed: reasons to take this   pantoprazole 40 MG tablet Commonly known as: PROTONIX Take 1 tablet by mouth once daily   pramipexole 1 MG tablet Commonly known as: MIRAPEX Take 1 mg by mouth at bedtime.   pregabalin 75 MG capsule Commonly known as: LYRICA Take 75 mg by mouth at bedtime.   [name] FlexTouch 200 UNIT/ML FlexTouch Pen Generic drug: insulin degludec Inject 20 Units into the skin at bedtime.               Discharge Care Instructions  (From admission, onward)           Start     Ordered   03/17/23 0000  Change dressing on IV access line weekly and  PRN  (Home infusion instructions - [facility] )        03/17/23 1652            Discharge Instructions: Please refer to Patient Instructions section of EMR for full details.  Patient was counseled important signs and symptoms that should prompt return to medical care, changes in medications, dietary instructions, activity restrictions, and follow up appointments.   Follow-Up Appointments:  Follow-up Information     Dough, Doris Cheadle, MD. Schedule an appointment as soon as possible for a visit in 1 week(s).   Specialty: Family Medicine Contact information: [address] [place] [address] [phone]  Follow up with Infectious Disease, appt already scheduled  Alfredo Martinez, MD 03/18/2023, 2:46 PM PGY-2, [facility] Family Medicine

## 2023-03-19 DIAGNOSIS — E860 Dehydration: Secondary | ICD-10-CM | POA: Insufficient documentation

## 2023-03-23 ENCOUNTER — Telehealth: Payer: Self-pay | Admitting: Pharmacist

## 2023-03-23 NOTE — Telephone Encounter (Signed)
Patient's acute renal failure seems to have resolved as SCr from 6/13 was down to 1.29 putting her CrCl ~56 ml/min. Called and spoke to Amy at [facility]. Gave verbal order to increase daptomycin dose to q24h from q48h. Dose will be 500 mg IV q24h. Amy verbalized understanding.   Habib Kise L. Terrilyn Tyner, PharmD, BCIDP, AAHIVP, CPP Clinical Pharmacist Practitioner Infectious Diseases Clinical Pharmacist [facility] 03/23/2023, 3:27 PM

## 2023-03-27 ENCOUNTER — Telehealth: Payer: Self-pay | Admitting: Cardiology

## 2023-03-27 NOTE — Telephone Encounter (Signed)
Pt c/o swelling: STAT is pt has developed SOB within 24 hours  How much weight have you gained and in what time span?  Lafonda Mosses with [facility] states patient weighs 183 lbs and which is above her parameters they have for her. Highest is 182 lbs. This is Diana's first time seeing the patient, so she doesn't know how much she normally weighs  If swelling, where is the swelling located?  Hands + patient mentioned tightness in hands, ankles, per Lafonda Mosses, lungs sound good  Are you currently taking a fluid pill?  Lafonda Mosses states the patient was discharged from the hospital on 6/09 and they took her off Torsemide  Are you currently SOB?  Lafonda Mosses states patient had no SOB  Do you have a log of your daily weights (if so, list)?   Have you gained 3 pounds in a day or 5 pounds in a week?   Have you traveled recently?

## 2023-03-27 NOTE — Telephone Encounter (Signed)
Follow Up:     Called back and wanted you to know that patient weight today was 183 and on 03-23-23 it was 176.

## 2023-03-28 ENCOUNTER — Ambulatory Visit (INDEPENDENT_AMBULATORY_CARE_PROVIDER_SITE_OTHER): Payer: PPO | Admitting: Family

## 2023-03-28 ENCOUNTER — Encounter: Payer: Self-pay | Admitting: Family

## 2023-03-28 ENCOUNTER — Other Ambulatory Visit: Payer: Self-pay

## 2023-03-28 ENCOUNTER — Telehealth: Payer: Self-pay

## 2023-03-28 VITALS — BP 156/78 | HR 74 | Temp 98.4°F | Wt 182.0 lb

## 2023-03-28 DIAGNOSIS — M4645 Discitis, unspecified, thoracolumbar region: Secondary | ICD-10-CM | POA: Diagnosis not present

## 2023-03-28 DIAGNOSIS — Z452 Encounter for adjustment and management of vascular access device: Secondary | ICD-10-CM | POA: Insufficient documentation

## 2023-03-28 DIAGNOSIS — T85733D Infection and inflammatory reaction due to implanted electronic neurostimulator of spinal cord, electrode (lead), subsequent encounter: Secondary | ICD-10-CM | POA: Diagnosis not present

## 2023-03-28 MED ORDER — DOXYCYCLINE HYCLATE 100 MG PO TABS
100.0000 mg | ORAL_TABLET | Freq: Two times a day (BID) | ORAL | 5 refills | Status: DC
Start: 2023-03-28 — End: 2023-08-06

## 2023-03-28 MED ORDER — CIPROFLOXACIN HCL 500 MG PO TABS
500.0000 mg | ORAL_TABLET | Freq: Two times a day (BID) | ORAL | 2 refills | Status: DC
Start: 2023-03-28 — End: 2023-08-06

## 2023-03-28 NOTE — Telephone Encounter (Signed)
Patient informed of Dr. Hulen Shouts recommendation below:  "Just in hospital with kidney failure She should contact nephrologist or PCP"  Patient verbalized understanding and had no further questions at this time.

## 2023-03-28 NOTE — Telephone Encounter (Signed)
Per Calone FNP okay to pull picc after last dose 6/25. Community message sent to [employer] team. Juanita Laster, RMA

## 2023-03-28 NOTE — Patient Instructions (Addendum)
Nice to see you.  Continue to take your medication daily as prescribed.  Start taking oral medication on 04/04/23.  Plan for follow up in 1 month with Tammy Sours or Dr. Daiva Eves or sooner if needed with lab work on the same day.  Have a great day and stay safe!

## 2023-03-28 NOTE — Progress Notes (Signed)
Subjective:    Patient ID: Katelyn [name], female    DOB: [birth year], 67 y.o.   MRN: [id number]  Chief Complaint  Patient presents with   Follow-up    Nausea throughout the day/    HPI:  Katelyn [name] is a 67 y.o. female with prior postoperative infection with Serratia in 2017 and recently T11-T12 discitis/osteomyelitis and epidural phlegmon from T8-T9 and L3-L2 with spinal stimulator in place presenting today for hospitalization follow-up.  Katelyn [name] was recently admitted to [facility] with altered mental status and acute renal failure in the midst of being treated with daptomycin and cefepime with end date of 04/03/2023.  There was concern for cefepime toxicity which was changed to ertapenem.  CT abdomen pelvis with no acute abnormalities, cirrhosis, and similar endplate destruction at T11/T12 consistent with discitis osteomyelitis.  No new cultures obtained.  Renal function gradually improved and on discharge creatinine was 2.16 and most recent lab work with creatinine 1.29. Sed rate was 91 with CRP 5.  [name] has been doing well since leaving the hospital and continues to receive ertapenem and daptomycin as prescribed with no adverse side effects and occasional nausea.  PICC line functioning appropriately with no pain and dressing is clean and dry.  Ambulating with use of modified rollator.  No fevers or chills.  Estimated Creatinine Clearance: 25.5 mL/min (A) (by C-G formula based on SCr of 2.16 mg/dL (H)).   Allergies  Allergen Reactions   Atorvastatin Nausea And Vomiting and Other (See Comments)    MYALGIAS   Talwin [Pentazocine] Other (See Comments)    headache   Gabapentin Swelling   Other Nausea Only    UNSPECIFIED Anesthesia   Cefepime Other (See Comments)    Presumed AMS/neurotoxicity in the setting of AKI      Outpatient Medications Prior to Visit  Medication Sig Dispense Refill   carvedilol (COREG) 6.25 MG tablet Take 1 tablet (6.25 mg total) by mouth 2 (two)  times daily. Take 6.25 mg by mouth 2 (two) times daily. / Needs appointment for further refills 60 tablet 0   daptomycin (CUBICIN) IVPB Inject 500 mg into the vein every other day for 18 days. Indication:  Discitis/osteo First Dose: Yes Last Day of Therapy: 04/03/23 Labs - Once weekly:  CBC/D, CPK, ESR and CRP Labs - Twice weekly: BMP Method of administration: IV Push Pull PICC line at the completion of IV antibiotic therapy Method of administration may be changed at the discretion of home infusion pharmacist based upon assessment of the patient and/or caregiver's ability to self-administer the medication ordered. 18 Units 0   DULoxetine (CYMBALTA) 60 MG capsule Take 60 mg by mouth at bedtime.      ertapenem (INVANZ) IVPB Inject 500 mg into the vein daily for 18 days. Indication:  Discitis/osteo First Dose: Yes Last Day of Therapy:  04/03/23 Labs - Once weekly:  CBC/D, ESR and CRP Labs - Twice weekly: BMP Method of administration: Mini-Bag Plus / Gravity Pull PICC line at the completion of IV antibiotic therapy Method of administration may be changed at the discretion of home infusion pharmacist based upon assessment of the patient and/or caregiver's ability to self-administer the medication ordered. 18 Units 0   insulin aspart protamine - aspart (NOVOLOG 70/30 MIX) (70-30) 100 UNIT/ML FlexPen Inject 10-12 Units into the skin 2 (two) times daily with a meal.     mirtazapine (REMERON) 15 MG tablet Take 30 mg by mouth at bedtime.  Multiple Vitamins-Minerals (HAIR SKIN AND NAILS FORMULA) TABS Take 1 tablet by mouth daily.     nitroGLYCERIN (NITROSTAT) 0.4 MG SL tablet Place 0.4 mg under the tongue every 5 (five) minutes as needed for chest pain.     oxyCODONE (OXY IR/ROXICODONE) 5 MG immediate release tablet Take 0.5 tablets (2.5 mg total) by mouth every 6 (six) hours as needed for severe pain. 15 tablet 0   pantoprazole (PROTONIX) 40 MG tablet Take 1 tablet by mouth once daily 90 tablet 2    pramipexole (MIRAPEX) 1 MG tablet Take 1 mg by mouth at bedtime.     pregabalin (LYRICA) 75 MG capsule Take 75 mg by mouth at bedtime.     TRESIBA FLEXTOUCH 200 UNIT/ML FlexTouch Pen Inject 20 Units into the skin at bedtime. 3 mL 0   aspirin EC 81 MG tablet Take 81 mg by mouth daily. **presently on hold for surgery**     No facility-administered medications prior to visit.     Past Medical History:  Diagnosis Date   Anxiety    Arthritis    Bursitis of right hip    CAD (coronary artery disease)    Cardiac catheterization June 2014 in [place] - 50% circumflex stenosis   Chest pain, neg MI, stable CAD non obstructive on cath 10/05/20 10/04/2020   Chronic diastolic heart failure (HCC) 08/20/2017   Chronic kidney disease, stage 3 (HCC)    does not see nephrologist   Chronic low back pain without sciatica 03/14/2016   CKD (chronic kidney disease), stage III (HCC) 10/07/2020   Complication of anesthesia    Cough 04/19/2017   Overview:  Last Assessment & Plan:  Formatting of this note may be different from the original. Cough - ? ACE related with AR triggers   Plan  Patient Instructions  Discuss with your primary doctor that lisinopril pain, need making your cough worse. May use Mucinex DM twice daily as needed for cough and congestion Zyrtec 10 mg at bedtime as needed for drainage Saline nasal spray as needed. Lab tests today Activity as tolerated. Follow with Dr. Craige Cotta in 3-4 months and As needed   Please contact office for sooner follow up if symptoms do not improve or worsen or seek emergency care    Depression    Dyspnea    with exertion   " lazy lung" - per  Dr Craige Cotta from back issues- 06/2016   Elevated liver enzymes 12/05/2016   Essential hypertension    GERD (gastroesophageal reflux disease)    Gout 03/14/2016   Greater trochanteric bursitis of right hip 02/02/2012   H/O hiatal hernia    Heart murmur    History of blood transfusion 2016   History of esophageal stricture 10/07/2020    History of kidney stones    Hypercholesterolemia    Hypertensive heart disease with heart failure (HCC) 01/01/2017   Hypoxia 10/07/2020   Iliotibial band syndrome of right side 02/02/2012   Iron deficiency anemia due to chronic blood loss 03/14/2016   LBBB (left bundle branch block) 01/01/2017   Left bundle branch block    Leg weakness 10/07/2020   Lumbar stenosis    Meralgia paraesthetica 12/05/2016   Mild CAD 11/24/2015   Morbid obesity (HCC) 10/07/2020   Neuropathy    OSA (obstructive sleep apnea) 05/24/2016   Overview:  Managed PULM   PONV (postoperative nausea and vomiting)    "no N/V with patch"   Restless leg syndrome    S/P lumbar laminectomy  11/26/2015   S/P lumbar spinal fusion 08/29/2016   Tinnitus 12/05/2016   Type 2 diabetes mellitus (HCC)    UTI (urinary tract infection) 10/07/2020     Past Surgical History:  Procedure Laterality Date   ABDOMINAL HYSTERECTOMY  1983   APPENDECTOMY  Age 15   BACK SURGERY     FIRST LUMBAR FUSION/ SURGERY APRIL 2012 AND FUSION WITH INSTRUMENTATION SEPT 2012   CARDIAC CATHETERIZATION     x 2   CARPAL TUNNEL RELEASE     bil   CHOLECYSTECTOMY  1990's   COLONOSCOPY  12/12/2017   Colonic polyp status post polypectomy. Mild sigmoid diverticulosis. Otherwise normal colonoscopy to terminal ileum.   CYSTO EXTRACTION KIDNEY STONES     ESOPHAGOGASTRODUODENOSCOPY  12/12/2017   Small hiatal hernia. Mild gastritis. Status post esophageal dilatation.   EXCISION/RELEASE BURSA HIP  02/02/2012   Procedure: EXCISION/RELEASE BURSA HIP;  Surgeon: Jacki Cones, MD;  Location: [facility] ORS;  Service: Orthopedics;  Laterality: Right;  Right Hip Bursectomy   EYE SURGERY Bilateral    cataracts   IR THORACIC DISC ASPIRATION W/IMG GUIDE  02/14/2023   KIDNEY STONE SURGERY  2008   LAMINECTOMY WITH POSTERIOR LATERAL ARTHRODESIS LEVEL 4 N/A 02/19/2023   Procedure: THORACIC TEN-LUMBAR TWO INSTRUMENTED FUSION;  Surgeon: Tia Alert, MD;  Location: [facility] OR;  Service:  Neurosurgery;  Laterality: N/A;   Left knee surgery x 2  1996   reconstruction   LUMBAR DISC SURGERY  08/2016   LUMBAR LAMINECTOMY/DECOMPRESSION MICRODISCECTOMY Right 11/26/2015   Procedure: Extraforaminal Microdiscectomy  - Lumbar two-three- right;  Surgeon: Tia Alert, MD;  Location: [facility] OR[facility];  Service: Neurosurgery;  Laterality: Right;  right    LUMBAR WOUND DEBRIDEMENT N/A 10/25/2016   Procedure: Lumbar wound revision;  Surgeon: Tia Alert, MD;  Location: [facility] OR;  Service: Neurosurgery;  Laterality: N/A;  Lumbar wound revision   Right shoulder surgery  2010   spur   RIGHT/LEFT HEART CATH AND CORONARY ANGIOGRAPHY N/A 10/05/2020   Procedure: RIGHT/LEFT HEART CATH AND CORONARY ANGIOGRAPHY;  Surgeon: Swaziland, Peter M, MD;  Location: [facility] INVASIVE CV LAB;  Service: Cardiovascular;  Laterality: N/A;   SPINAL CORD STIMULATOR REMOVAL  02/19/2023   Procedure: LUMBAR SPINAL CORD STIMULATOR REMOVAL;  Surgeon: Tia Alert, MD;  Location: [facility] OR;  Service: Neurosurgery;;       Review of Systems  Musculoskeletal:  Positive for back pain.      Objective:    BP (!) 156/78   Pulse 74   Temp 98.4 F (36.9 C) (Oral)   Wt 182 lb (82.6 kg)   SpO2 96%   BMI 33.29 kg/m  Nursing note and vital signs reviewed.  Physical Exam Constitutional:      General: She is not in acute distress.    Appearance: She is well-developed.     Comments: Seated in the chair; brace in place; rollator for ambulation.   Cardiovascular:     Rate and Rhythm: Normal rate and regular rhythm.     Heart sounds: Normal heart sounds.  Pulmonary:     Effort: Pulmonary effort is normal.     Breath sounds: Normal breath sounds.  Skin:    General: Skin is warm and dry.  Neurological:     Mental Status: She is alert and oriented to person, place, and time. Mental status is at baseline.  Psychiatric:        Mood and Affect: Mood normal.  02/08/2023    1:58 PM 12/26/2022    3:43 PM  Depression screen  PHQ 2/9  Decreased Interest 0 0  Down, Depressed, Hopeless 0 0  PHQ - 2 Score 0 0       Assessment & Plan:    Patient Active Problem List   Diagnosis Date Noted   PICC (peripherally inserted central catheter) in place 03/28/2023   Acute renal failure (ARF) (HCC) 03/13/2023   Elevated LFTs 03/13/2023   AKI (acute kidney injury) (HCC) 02/20/2023   Status post thoracic spinal fusion 02/19/2023   Discitis 02/13/2023   Malfunction of spinal cord stimulator (HCC) 02/13/2023   Serratia 02/13/2023   Diabetes mellitus without complication (HCC) 02/13/2023   Osteomyelitis (HCC) 02/05/2023   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   Infection of spinal cord stimulator (HCC) 12/07/2022   Lactic acidosis 12/06/2022   Hypomagnesemia 12/06/2022   Anemia of chronic disease 12/06/2022   Diskitis 12/05/2022   Acute thoracic back pain 06/22/2022   Other long term (current) drug therapy 06/22/2022   Lumbar spondylosis 06/22/2022   Thoracic spondylosis 06/22/2022   Bilateral sacroiliitis (HCC) 06/22/2022   Chronic, continuous use of opioids 05/31/2022   Pain medication agreement 05/02/2022   Postlaminectomy syndrome of lumbar region 05/02/2022   Radiculopathy of thoracolumbar region 05/02/2022   Class 2 severe obesity due to excess calories with serious comorbidity and body mass index (BMI) of 37.0 to 37.9 in adult [facility]) 05/02/2022   Mastodynia 04/13/2022   Peripheral artery disease (HCC) 07/11/2021   COVID-19 virus infection 06/21/2021   Rib pain on left side 06/06/2021   Abdominal pain 06/05/2021   Diabetes 1.5, 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   GAD (generalized anxiety disorder) 11/25/2020   Anxiety and depression 11/03/2020   DM2 (diabetes mellitus, type 2) (HCC)    Restless leg syndrome    PONV (postoperative nausea and vomiting)    Neuropathy    Lumbar stenosis    Left bundle branch block    Hypercholesterolemia    Heart murmur    History of  kidney stones    H/O hiatal hernia    Essential hypertension    Dyspnea    Chronic depression    Complication of anesthesia    CKD stage 3b, GFR 30-44 ml/min (HCC)    CAD (coronary artery disease)    Bursitis of right hip    Arthritis    Anxiety    Morbid obesity (HCC) 10/07/2020   Hypoxia 10/07/2020   History of esophageal stricture 10/07/2020   CKD (chronic kidney disease), stage III (HCC) 10/07/2020   Leg weakness 10/07/2020   UTI (urinary tract infection) 10/07/2020   Chronic hypoxemic respiratory failure (HCC) 10/07/2020   Pulmonary hypertension (HCC) 10/06/2020   Chest pain, neg MI, stable CAD non obstructive on cath 10/05/20 10/04/2020   Hardening of the aorta (main artery of the heart) (HCC) 11/25/2019   Hypertensive heart disease with congestive heart failure and chronic kidney disease (HCC) 11/25/2019   Shortness of breath 06/11/2019   Fatty liver 04/08/2019   Migraine 04/08/2019   History of chronic kidney disease 04/08/2019   Acute non-recurrent sinusitis 10/12/2017   Chronic diastolic CHF (congestive heart failure) (HCC) 08/20/2017   Genetic testing 08/17/2017   Family history of ovarian cancer 08/17/2017   Family history of breast cancer 08/17/2017   Cough 04/19/2017   DOE (dyspnea on exertion) 04/19/2017   Type 2 diabetes mellitus with diabetic neuropathy, unspecified (HCC) 01/16/2017  Hypertensive heart disease with heart failure (HCC) 01/01/2017   Hyperlipidemia 01/01/2017   LBBB (left bundle branch block) 01/01/2017   Elevated liver enzymes 12/05/2016   Meralgia paraesthetica 12/05/2016   Tinnitus 12/05/2016   Spinal stenosis of lumbar region with neurogenic claudication 12/05/2016   Wound dehiscence 10/23/2016   S/P lumbar spinal fusion 08/29/2016   OSA (obstructive sleep apnea) 05/24/2016   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   Chronic low back pain without sciatica 03/14/2016   GERD (gastroesophageal reflux disease) 03/14/2016   Gout 03/14/2016    Iron deficiency anemia due to chronic blood loss 03/14/2016   Type 2 diabetes mellitus without complication, with long-term current use of insulin (HCC) 03/14/2016   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HCC) 12/07/2015   Restless leg 12/07/2015   S/P lumbar laminectomy 11/26/2015   Postprocedural state 11/26/2015   Mild CAD 11/24/2015   Lumbar radiculopathy 10/15/2015   History of blood transfusion 2016   Kidney stone 04/20/2014   Stricture of esophagus 04/20/2014   Greater trochanteric bursitis of right hip 02/02/2012   Iliotibial band syndrome of right side 02/02/2012   Iliotibial band syndrome 02/02/2012   Bursitis, trochanteric 02/02/2012     Problem List Items Addressed This Visit       Musculoskeletal and Integument   Discitis - Primary    Ms. Moorefield continues to receive daptomycin and ertapenem for discitis/osteomyelitis complicated by the presence of hardware status post removal of spinal stimulator.  Reviewed lab work and discussed plan of care to include completing remaining 6 days of IV antibiotics with transition to oral suppression with 500 mg ciprofloxacin twice daily and doxycycline 100 mg twice daily.  Educated that for as long as hardware is present suppression may be necessary and there is no definitive test that can be used to determine when it is safe to stop antibiotics.  Cautioned that there is always the possibility that antibiotics may continue lifelong.  Home health orders reiterated and will pull PICC at completion of treatment on 6/25 and start oral therapy.  Plan for follow-up in 1 month or sooner if needed for therapeutic drug monitoring.      Relevant Medications   ciprofloxacin (CIPRO) 500 MG tablet   doxycycline (VIBRA-TABS) 100 MG tablet     Other   Infection of spinal cord stimulator (HCC)   Relevant Medications   ciprofloxacin (CIPRO) 500 MG tablet   doxycycline (VIBRA-TABS) 100 MG tablet   PICC (peripherally inserted central catheter) in place     Ms. Mamula has a PICC line located in her right upper extremity that is clean and dry and without evidence of infection and functioning appropriately.  Continue routine care per protocol.  Removal at the completion of treatment on 04/03/2023.        I am having Shekera B. Ortwein start on ciprofloxacin and doxycycline. I am also having her maintain her DULoxetine, mirtazapine, nitroGLYCERIN, pramipexole, carvedilol, Hair Skin and Nails Formula, pregabalin, pantoprazole, aspirin EC, Tresiba FlexTouch, insulin aspart protamine - aspart, daptomycin, ertapenem, and oxyCODONE.   Meds ordered this encounter  Medications   ciprofloxacin (CIPRO) 500 MG tablet    Sig: Take 1 tablet (500 mg total) by mouth 2 (two) times daily. Start on 04/04/23    Dispense:  60 tablet    Refill:  2    Order Specific Question:   Supervising Provider    Answer:   Judyann Munson [[id number]]   doxycycline (VIBRA-TABS) 100 MG tablet    Sig: Take 1 tablet (  100 mg total) by mouth 2 (two) times daily. Start taking 04/04/23    Dispense:  60 tablet    Refill:  5    Order Specific Question:   Supervising Provider    Answer:   Judyann Munson [id number]   OPAT Orders Discharge antibiotics to be given via PICC line - continue daptomycin and ertapenem.   Per pharmacy protocol   End Date: 04/03/23  [facility] Care Per Protocol:  _X_ Please pull PIC at completion of IV antibiotics __ Please leave PIC in place until doctor has seen patient or been notified  Fax weekly labs to [phone]  Clinic Follow Up Appt:  1 month      Follow-up: Return in about 1 month (around 04/27/2023), or if symptoms worsen or fail to improve.   Marcos Eke, MSN, FNP-C Nurse Practitioner [facility] Main number: [phone]

## 2023-03-28 NOTE — Telephone Encounter (Signed)
Husband returned RN's call. 

## 2023-03-28 NOTE — Telephone Encounter (Signed)
Left message for the patient to call back.

## 2023-03-28 NOTE — Assessment & Plan Note (Signed)
Ms. Melchor continues to receive daptomycin and ertapenem for discitis/osteomyelitis complicated by the presence of hardware status post removal of spinal stimulator.  Reviewed lab work and discussed plan of care to include completing remaining 6 days of IV antibiotics with transition to oral suppression with 500 mg ciprofloxacin twice daily and doxycycline 100 mg twice daily.  Educated that for as long as hardware is present suppression may be necessary and there is no definitive test that can be used to determine when it is safe to stop antibiotics.  Cautioned that there is always the possibility that antibiotics may continue lifelong.  Home health orders reiterated and will pull PICC at completion of treatment on 6/25 and start oral therapy.  Plan for follow-up in 1 month or sooner if needed for therapeutic drug monitoring.

## 2023-03-28 NOTE — Assessment & Plan Note (Signed)
Katelyn [name] has a PICC line located in her right upper extremity that is clean and dry and without evidence of infection and functioning appropriately.  Continue routine care per protocol.  Removal at the completion of treatment on 04/03/2023.

## 2023-04-01 ENCOUNTER — Encounter (HOSPITAL_BASED_OUTPATIENT_CLINIC_OR_DEPARTMENT_OTHER): Payer: PPO | Admitting: Pulmonary Disease

## 2023-04-03 ENCOUNTER — Telehealth: Payer: Self-pay | Admitting: Cardiology

## 2023-04-03 NOTE — Telephone Encounter (Signed)
Pt c/o swelling: STAT is pt has developed SOB within 24 hours  If swelling, where is the swelling located?  Both legs  How much weight have you gained and in what time span?   Yes  Have you gained 3 pounds in a day or 5 pounds in a week?   Yes  Do you have a log of your daily weights (if so, list)?  No  Are you currently taking a fluid pill?  Yes  Are you currently SOB?   No  Have you traveled recently?   No   Caller stated patient's BP was elevated 142/80 and her weight is also elevated at 183.0 lbs.  Caller stated patient has about a +1 edema.  Caller wants to know next steps.

## 2023-04-04 NOTE — Telephone Encounter (Signed)
Called Dorene Sorrow, the patient's husband, per [facility] and informed him of Dr. Hulen Shouts recommendation below:  "I recently fielded a similar call she had been in the hospital with acute kidney failure and my advice was to contact her nephrologist whether or not to resume diuretics, what dosage.  We did not take care of her during that hospitalization"  Dorene Sorrow verbalized understanding and stated that they would contact the kidney doctor regarding her fluid weight gain and whether to start diuretics. Dorene Sorrow had no further questions at this time.

## 2023-04-06 DIAGNOSIS — I249 Acute ischemic heart disease, unspecified: Secondary | ICD-10-CM

## 2023-04-06 DIAGNOSIS — I1310 Hypertensive heart and chronic kidney disease without heart failure, with stage 1 through stage 4 chronic kidney disease, or unspecified chronic kidney disease: Secondary | ICD-10-CM | POA: Insufficient documentation

## 2023-04-06 DIAGNOSIS — N289 Disorder of kidney and ureter, unspecified: Secondary | ICD-10-CM | POA: Insufficient documentation

## 2023-04-06 DIAGNOSIS — R6510 Systemic inflammatory response syndrome (SIRS) of non-infectious origin without acute organ dysfunction: Secondary | ICD-10-CM

## 2023-04-06 DIAGNOSIS — I4729 Other ventricular tachycardia: Secondary | ICD-10-CM | POA: Insufficient documentation

## 2023-04-06 DIAGNOSIS — E785 Hyperlipidemia, unspecified: Secondary | ICD-10-CM | POA: Insufficient documentation

## 2023-04-06 DIAGNOSIS — D696 Thrombocytopenia, unspecified: Secondary | ICD-10-CM | POA: Insufficient documentation

## 2023-04-06 DIAGNOSIS — E871 Hypo-osmolality and hyponatremia: Secondary | ICD-10-CM | POA: Insufficient documentation

## 2023-04-06 DIAGNOSIS — T68XXXA Hypothermia, initial encounter: Secondary | ICD-10-CM | POA: Insufficient documentation

## 2023-04-06 DIAGNOSIS — M5430 Sciatica, unspecified side: Secondary | ICD-10-CM | POA: Insufficient documentation

## 2023-04-06 DIAGNOSIS — R4781 Slurred speech: Secondary | ICD-10-CM | POA: Insufficient documentation

## 2023-04-06 HISTORY — DX: Systemic inflammatory response syndrome (sirs) of non-infectious origin without acute organ dysfunction: R65.10

## 2023-04-06 HISTORY — DX: Other ventricular tachycardia: I47.29

## 2023-04-06 HISTORY — DX: Acute ischemic heart disease, unspecified: I24.9

## 2023-04-08 NOTE — Progress Notes (Signed)
 " Cardiology Office Note:  .   Date:  04/09/2023  ID:  Katelyn [name], DOB [birth year], MRN [id number] PCP: Ofilia Lamar CROME, MD  [facility] Providers Cardiologist:  Redell Leiter, MD    History of Present Illness: .   Katelyn [name] is a 67 y.o. female with a past medical history of nonobstructive CAD LHC 2021, hypertension, LBBB, chronic diastolic heart failure, PAD, pulmonary hypertension, NSVT, OSA, restrictive lung disease, GERD, DM 2, CKD stage III, thrombocytopenia, hyperlipidemia, cirrhosis.  Most recently evaluated by Dr. Leiter on 01/08/2023, at this time she was volume overloaded, she was started on torsemide  twice daily.  She was admitted to [facility] on 03/13/2023 with altered mental status and found to be in acute renal failure creatinine elevated to 4.06, BUN 95.  CT of the head was negative for any abnormalities, EEG was negative, ID was consulted who recommended to hold cefepime  and daptomycin  (previously being treated for osteomyelitis related to recent pain stimulator implantation).  She was hyponatremic although this had been somewhat of an ongoing issue for her for some time.  EF revealed mild LVH, EF 40 to 45%, elevated LA and LV end-diastolic pressures.  She presents today accompanied by her husband for follow-up after hospitalization as outlined above.  She is working with OT and PT at home.  Has an appointment to establish with a nephrologist tomorrow, and appointment to establish with a liver specialist, will see her PCP again on 7/12, and see infectious disease in July as well.  Has been evaluated by PCP x 1 since she was discharged, repeat lab work was obtained and revealed Evaluated by PCP on 03/20/2023 sodium 141, potassium 4.3, creatinine 1.62, GFR 35, elevated LFTs. She is feeling better, still very weak, using a walker for ambulation. She denies chest pain, palpitations, dyspnea, pnd, orthopnea, n, v, dizziness, syncope, edema, weight gain, or early satiety.  ROS:  Review of Systems  Constitutional: Negative.   HENT: Negative.    Eyes: Negative.   Respiratory: Negative.    Cardiovascular:  Positive for leg swelling. Negative for chest pain, palpitations, orthopnea and PND.  Gastrointestinal: Negative.   Genitourinary: Negative.   Musculoskeletal:  Positive for back pain.  Skin: Negative.   Neurological: Negative.   Endo/Heme/Allergies: Negative.   Psychiatric/Behavioral: Negative.       Studies Reviewed: .        Cardiac Studies & Procedures   CARDIAC CATHETERIZATION  CARDIAC CATHETERIZATION 10/06/2020  Narrative  Prox LAD lesion is 30% stenosed.  1st Diag lesion is 55% stenosed.  Dist Cx lesion is 40% stenosed.  Prox RCA lesion is 25% stenosed.  Dist RCA lesion is 25% stenosed.  There is mild left ventricular systolic dysfunction.  LV end diastolic pressure is moderately elevated.  The left ventricular ejection fraction is 50-55% by visual estimate.  Hemodynamic findings consistent with mild pulmonary hypertension.  1. Nonobstructive CAD. Codominant circulation 2. Low normal LV systolic function. EF estimated at 50%. 3. Elevated LV filling pressures - PCWP 22 mm Hg, EDP 25 mm Hg 4. Mild pulmonary HTN with mean PAP 27 mm Hg 5. Elevated RA pressures 16 mm Hg 6. Reduced cardiac output with index 2.5  Plan: medical management for CAD. Need to repeat Echo to assess LV and RV function. May need to consider pulmonary embolic disease given pulmonary HTN and elevated RA pressures. This may reflect elevated Left heart pressures. Morbid obesity and OSA may also be contributing.  Findings Coronary Findings Diagnostic  Dominance: Co-dominant  Left Main Vessel was injected. Vessel is normal in caliber. Vessel is angiographically normal.  Left Anterior Descending Prox LAD lesion is 30% stenosed.  First Diagonal Branch 1st Diag lesion is 55% stenosed.  Left Circumflex Dist Cx lesion is 40% stenosed.  Right Coronary  Artery Prox RCA lesion is 25% stenosed. Dist RCA lesion is 25% stenosed.  Intervention  No interventions have been documented.   STRESS TESTS  MYOCARDIAL PERFUSION IMAGING 04/16/2019  Narrative  The left ventricular ejection fraction is normal (55-65%).  Nuclear stress EF: 56%.  There was no ST segment deviation noted during stress.  The study is normal.  This is a low risk study.   ECHOCARDIOGRAM  ECHOCARDIOGRAM COMPLETE 10/06/2020  Narrative ECHOCARDIOGRAM REPORT    Patient Name:   Katelyn [name] Loveall Date of Exam: 10/06/2020 Medical Rec #:  [id number]   Height:       62.0 in Accession #:    [id number]  Weight:       222.4 lb Date of Birth:  [birth year]   BSA:          2.000 m Patient Age:    64 years    BP:           145/73 mmHg Patient Gender: F           HR:           77 bpm. Exam Location:  Inpatient  Procedure: 2D Echo, 3D Echo, Color Doppler, Cardiac Doppler and Strain Analysis  Indications:    Pulmonary hypertension 416.8 / I27.2  History:        Patient has prior history of Echocardiogram examinations, most recent 04/28/2019. CAD, Arrythmias:LBBB; Risk Factors:Sleep Apnea, Hypertension, Diabetes and Dyslipidemia. GERD.  Sonographer:    Annabella Fell RDCS Referring Phys: [phone] PETER M JORDAN  IMPRESSIONS   1. Left ventricular ejection fraction, by estimation, is 55 to 60%. The left ventricle has normal function. The left ventricle has no regional wall motion abnormalities. There is mild left ventricular hypertrophy. Left ventricular diastolic parameters are indeterminate. 2. Right ventricular systolic function is normal. The right ventricular size is normal. Tricuspid regurgitation signal is inadequate for assessing PA pressure. 3. The mitral valve is normal in structure. No evidence of mitral valve regurgitation. No evidence of mitral stenosis. 4. The aortic valve is tricuspid. Aortic valve regurgitation is not visualized. No aortic stenosis is  present.  FINDINGS Left Ventricle: Left ventricular ejection fraction, by estimation, is 55 to 60%. The left ventricle has normal function. The left ventricle has no regional wall motion abnormalities. The left ventricular internal cavity size was normal in size. There is mild left ventricular hypertrophy. Left ventricular diastolic parameters are indeterminate.  Right Ventricle: The right ventricular size is normal. No increase in right ventricular wall thickness. Right ventricular systolic function is normal. Tricuspid regurgitation signal is inadequate for assessing PA pressure. The tricuspid regurgitant velocity is 1.34 m/s, and with an assumed right atrial pressure of 3 mmHg, the estimated right ventricular systolic pressure is 10.2 mmHg.  Left Atrium: Left atrial size was normal in size.  Right Atrium: Right atrial size was normal in size.  Pericardium: There is no evidence of pericardial effusion.  Mitral Valve: The mitral valve is normal in structure. No evidence of mitral valve regurgitation. No evidence of mitral valve stenosis.  Tricuspid Valve: The tricuspid valve is normal in structure. Tricuspid valve regurgitation is trivial.  Aortic Valve: The aortic valve is tricuspid. Aortic valve regurgitation is  not visualized. No aortic stenosis is present.  Pulmonic Valve: The pulmonic valve was not well visualized. Pulmonic valve regurgitation is not visualized.  Aorta: The aortic root and ascending aorta are structurally normal, with no evidence of dilitation.  IAS/Shunts: The interatrial septum was not well visualized.   LEFT VENTRICLE PLAX 2D LVIDd:         4.80 cm      Diastology LVIDs:         3.20 cm      LV e' medial:    6.65 cm/s LV PW:         0.90 cm      LV E/e' medial:  14.7 LV IVS:        1.00 cm      LV e' lateral:   7.38 cm/s LVOT diam:     2.00 cm      LV E/e' lateral: 13.2 LV SV:         72 LV SV Index:   36 LVOT Area:     3.14 cm  3D Volume EF: LV  Volumes (MOD)            3D EF:        58 % LV vol d, MOD A2C: 93.1 ml  LV EDV:       174 ml LV vol d, MOD A4C: 120.0 ml LV ESV:       73 ml LV vol s, MOD A2C: 33.6 ml  LV SV:        101 ml LV vol s, MOD A4C: 49.0 ml LV SV MOD A2C:     59.5 ml LV SV MOD A4C:     120.0 ml LV SV MOD BP:      64.5 ml  RIGHT VENTRICLE RV S prime:     13.50 cm/s TAPSE (M-mode): 3.0 cm  LEFT ATRIUM             Index       RIGHT ATRIUM          Index LA diam:        4.10 cm 2.05 cm/m  RA Area:     9.25 cm LA Vol (A2C):   47.0 ml 23.50 ml/m RA Volume:   14.40 ml 7.20 ml/m LA Vol (A4C):   50.8 ml 25.40 ml/m LA Biplane Vol: 51.1 ml 25.55 ml/m AORTIC VALVE LVOT Vmax:   112.00 cm/s LVOT Vmean:  79.200 cm/s LVOT VTI:    0.229 m  AORTA Ao Root diam: 2.70 cm Ao Asc diam:  2.90 cm  MITRAL VALVE               TRICUSPID VALVE MV Area (PHT): 4.68 cm    TR Peak grad:   7.2 mmHg MV Decel Time: 162 msec    TR Vmax:        134.00 cm/s MV E velocity: 97.70 cm/s MV A velocity: 75.80 cm/s  SHUNTS MV E/A ratio:  1.29        Systemic VTI:  0.23 m Systemic Diam: 2.00 cm  Lonni Nanas MD Electronically signed by Lonni Nanas MD Signature Date/Time: 10/06/2020/5:17:16 PM    Final             Risk Assessment/Calculations:             Physical Exam:   VS:  BP 130/74 (BP Location: Left Arm, Patient Position: Sitting, Cuff Size: Normal)   Pulse 80   Ht 5' 2 (1.575  m)   Wt 187 lb (84.8 kg)   SpO2 90%   BMI 34.20 kg/m    Wt Readings from Last 3 Encounters:  04/09/23 187 lb (84.8 kg)  03/28/23 182 lb (82.6 kg)  03/18/23 180 lb 11.2 oz (82 kg)    GEN: Well nourished, well developed in no acute distress NECK: No JVD; No carotid bruits CARDIAC: RRR, no murmurs, rubs, gallops RESPIRATORY:  Clear to auscultation without rales, wheezing or rhonchi  ABDOMEN: Soft, non-tender, non-distended EXTREMITIES:  non-pitting edema; No deformity   ASSESSMENT AND PLAN: .   CAD-nonobstructive per  LHC in 2021, Stable with no anginal symptoms. No indication for ischemic evaluation.  Continue aspirin  81 mg daily.  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 Continue lasix  40 mg daily. Continue Coreg  6.25 mg twice daily.  Currently on Farxiga 10 mg daily.  Other GDMT prohibited by kidney dysfunction. She will see nephrologist tomorrow to establish care.   CKD -most recent creatinine elevated 2.16, GFR 25.  Careful titration with antihypertensives and diuretics.  She will establish care tomorrow with a nephrologist in [place].  HLD -most recent LDL elevated at 104, she is statin intolerant, also with transaminitis. Will refer to pharmD for PCSK9i consideration.   DM2 - metformin  was stopped at d/c, she has noticed a spike in her BS readings. Has an appt with her endocrinologist in a few weeks, encouraged her to reach out sooner to see if any changes need to be made to her insulin .          Dispo: Refer to [employer]D. for PCSK9 inhibitor consideration, return in 3 months.  Signed, Delon JAYSON Hoover, NP  "

## 2023-04-09 ENCOUNTER — Ambulatory Visit: Payer: PPO | Admitting: Cardiology

## 2023-04-09 ENCOUNTER — Encounter: Payer: Self-pay | Admitting: Cardiology

## 2023-04-09 ENCOUNTER — Ambulatory Visit: Payer: PPO | Attending: Cardiology | Admitting: Cardiology

## 2023-04-09 VITALS — BP 130/74 | HR 80 | Ht 62.0 in | Wt 187.0 lb

## 2023-04-09 DIAGNOSIS — Z1379 Encounter for other screening for genetic and chromosomal anomalies: Secondary | ICD-10-CM

## 2023-04-09 DIAGNOSIS — M546 Pain in thoracic spine: Secondary | ICD-10-CM

## 2023-04-09 DIAGNOSIS — G629 Polyneuropathy, unspecified: Secondary | ICD-10-CM

## 2023-04-09 DIAGNOSIS — Z79899 Other long term (current) drug therapy: Secondary | ICD-10-CM

## 2023-04-09 DIAGNOSIS — T85192D Other mechanical complication of implanted electronic neurostimulator (electrode) of spinal cord, subsequent encounter: Secondary | ICD-10-CM

## 2023-04-09 DIAGNOSIS — D500 Iron deficiency anemia secondary to blood loss (chronic): Secondary | ICD-10-CM

## 2023-04-09 DIAGNOSIS — N289 Disorder of kidney and ureter, unspecified: Secondary | ICD-10-CM

## 2023-04-09 DIAGNOSIS — M7630 Iliotibial band syndrome, unspecified leg: Secondary | ICD-10-CM

## 2023-04-09 DIAGNOSIS — Z9889 Other specified postprocedural states: Secondary | ICD-10-CM

## 2023-04-09 DIAGNOSIS — J019 Acute sinusitis, unspecified: Secondary | ICD-10-CM

## 2023-04-09 DIAGNOSIS — M4640 Discitis, unspecified, site unspecified: Secondary | ICD-10-CM

## 2023-04-09 DIAGNOSIS — N183 Chronic kidney disease, stage 3 unspecified: Secondary | ICD-10-CM

## 2023-04-09 DIAGNOSIS — T68XXXA Hypothermia, initial encounter: Secondary | ICD-10-CM

## 2023-04-09 DIAGNOSIS — E785 Hyperlipidemia, unspecified: Secondary | ICD-10-CM

## 2023-04-09 DIAGNOSIS — E119 Type 2 diabetes mellitus without complications: Secondary | ICD-10-CM

## 2023-04-09 DIAGNOSIS — H9319 Tinnitus, unspecified ear: Secondary | ICD-10-CM

## 2023-04-09 DIAGNOSIS — M7060 Trochanteric bursitis, unspecified hip: Secondary | ICD-10-CM

## 2023-04-09 DIAGNOSIS — Z9289 Personal history of other medical treatment: Secondary | ICD-10-CM

## 2023-04-09 DIAGNOSIS — M545 Low back pain, unspecified: Secondary | ICD-10-CM

## 2023-04-09 DIAGNOSIS — F32A Depression, unspecified: Secondary | ICD-10-CM

## 2023-04-09 DIAGNOSIS — I447 Left bundle-branch block, unspecified: Secondary | ICD-10-CM

## 2023-04-09 DIAGNOSIS — I272 Pulmonary hypertension, unspecified: Secondary | ICD-10-CM

## 2023-04-09 DIAGNOSIS — I2510 Atherosclerotic heart disease of native coronary artery without angina pectoris: Secondary | ICD-10-CM | POA: Diagnosis not present

## 2023-04-09 DIAGNOSIS — D696 Thrombocytopenia, unspecified: Secondary | ICD-10-CM

## 2023-04-09 DIAGNOSIS — M47814 Spondylosis without myelopathy or radiculopathy, thoracic region: Secondary | ICD-10-CM

## 2023-04-09 DIAGNOSIS — E860 Dehydration: Secondary | ICD-10-CM

## 2023-04-09 DIAGNOSIS — F3340 Major depressive disorder, recurrent, in remission, unspecified: Secondary | ICD-10-CM

## 2023-04-09 DIAGNOSIS — G4733 Obstructive sleep apnea (adult) (pediatric): Secondary | ICD-10-CM | POA: Diagnosis not present

## 2023-04-09 DIAGNOSIS — E1169 Type 2 diabetes mellitus with other specified complication: Secondary | ICD-10-CM

## 2023-04-09 DIAGNOSIS — J984 Other disorders of lung: Secondary | ICD-10-CM

## 2023-04-09 DIAGNOSIS — G4700 Insomnia, unspecified: Secondary | ICD-10-CM

## 2023-04-09 DIAGNOSIS — I5022 Chronic systolic (congestive) heart failure: Secondary | ICD-10-CM

## 2023-04-09 DIAGNOSIS — G5710 Meralgia paresthetica, unspecified lower limb: Secondary | ICD-10-CM

## 2023-04-09 DIAGNOSIS — I1310 Hypertensive heart and chronic kidney disease without heart failure, with stage 1 through stage 4 chronic kidney disease, or unspecified chronic kidney disease: Secondary | ICD-10-CM

## 2023-04-09 DIAGNOSIS — R059 Cough, unspecified: Secondary | ICD-10-CM

## 2023-04-09 DIAGNOSIS — Z8041 Family history of malignant neoplasm of ovary: Secondary | ICD-10-CM

## 2023-04-09 DIAGNOSIS — R29898 Other symptoms and signs involving the musculoskeletal system: Secondary | ICD-10-CM

## 2023-04-09 DIAGNOSIS — N390 Urinary tract infection, site not specified: Secondary | ICD-10-CM

## 2023-04-09 DIAGNOSIS — I5020 Unspecified systolic (congestive) heart failure: Secondary | ICD-10-CM

## 2023-04-09 DIAGNOSIS — R011 Cardiac murmur, unspecified: Secondary | ICD-10-CM

## 2023-04-09 DIAGNOSIS — E1122 Type 2 diabetes mellitus with diabetic chronic kidney disease: Secondary | ICD-10-CM

## 2023-04-09 DIAGNOSIS — F419 Anxiety disorder, unspecified: Secondary | ICD-10-CM

## 2023-04-09 DIAGNOSIS — E7800 Pure hypercholesterolemia, unspecified: Secondary | ICD-10-CM

## 2023-04-09 DIAGNOSIS — R6510 Systemic inflammatory response syndrome (SIRS) of non-infectious origin without acute organ dysfunction: Secondary | ICD-10-CM

## 2023-04-09 DIAGNOSIS — E139 Other specified diabetes mellitus without complications: Secondary | ICD-10-CM

## 2023-04-09 DIAGNOSIS — U071 COVID-19: Secondary | ICD-10-CM

## 2023-04-09 DIAGNOSIS — M48061 Spinal stenosis, lumbar region without neurogenic claudication: Secondary | ICD-10-CM

## 2023-04-09 DIAGNOSIS — M48062 Spinal stenosis, lumbar region with neurogenic claudication: Secondary | ICD-10-CM

## 2023-04-09 DIAGNOSIS — R0781 Pleurodynia: Secondary | ICD-10-CM

## 2023-04-09 DIAGNOSIS — M5416 Radiculopathy, lumbar region: Secondary | ICD-10-CM

## 2023-04-09 DIAGNOSIS — N1832 Chronic kidney disease, stage 3b: Secondary | ICD-10-CM

## 2023-04-09 DIAGNOSIS — I130 Hypertensive heart and chronic kidney disease with heart failure and stage 1 through stage 4 chronic kidney disease, or unspecified chronic kidney disease: Secondary | ICD-10-CM

## 2023-04-09 DIAGNOSIS — N644 Mastodynia: Secondary | ICD-10-CM

## 2023-04-09 DIAGNOSIS — K222 Esophageal obstruction: Secondary | ICD-10-CM

## 2023-04-09 DIAGNOSIS — M7071 Other bursitis of hip, right hip: Secondary | ICD-10-CM

## 2023-04-09 DIAGNOSIS — M5415 Radiculopathy, thoracolumbar region: Secondary | ICD-10-CM

## 2023-04-09 DIAGNOSIS — Z981 Arthrodesis status: Secondary | ICD-10-CM

## 2023-04-09 DIAGNOSIS — M869 Osteomyelitis, unspecified: Secondary | ICD-10-CM

## 2023-04-09 DIAGNOSIS — R7989 Other specified abnormal findings of blood chemistry: Secondary | ICD-10-CM

## 2023-04-09 DIAGNOSIS — M109 Gout, unspecified: Secondary | ICD-10-CM

## 2023-04-09 DIAGNOSIS — N200 Calculus of kidney: Secondary | ICD-10-CM

## 2023-04-09 DIAGNOSIS — N179 Acute kidney failure, unspecified: Secondary | ICD-10-CM

## 2023-04-09 DIAGNOSIS — M5430 Sciatica, unspecified side: Secondary | ICD-10-CM

## 2023-04-09 DIAGNOSIS — E872 Acidosis, unspecified: Secondary | ICD-10-CM

## 2023-04-09 DIAGNOSIS — F411 Generalized anxiety disorder: Secondary | ICD-10-CM

## 2023-04-09 DIAGNOSIS — T85733D Infection and inflammatory reaction due to implanted electronic neurostimulator of spinal cord, electrode (lead), subsequent encounter: Secondary | ICD-10-CM

## 2023-04-09 DIAGNOSIS — M961 Postlaminectomy syndrome, not elsewhere classified: Secondary | ICD-10-CM

## 2023-04-09 DIAGNOSIS — R0609 Other forms of dyspnea: Secondary | ICD-10-CM

## 2023-04-09 DIAGNOSIS — M7631 Iliotibial band syndrome, right leg: Secondary | ICD-10-CM

## 2023-04-09 DIAGNOSIS — Z87448 Personal history of other diseases of urinary system: Secondary | ICD-10-CM

## 2023-04-09 DIAGNOSIS — M47816 Spondylosis without myelopathy or radiculopathy, lumbar region: Secondary | ICD-10-CM

## 2023-04-09 DIAGNOSIS — I700 Atherosclerosis of aorta: Secondary | ICD-10-CM

## 2023-04-09 DIAGNOSIS — F1190 Opioid use, unspecified, uncomplicated: Secondary | ICD-10-CM

## 2023-04-09 DIAGNOSIS — R748 Abnormal levels of other serum enzymes: Secondary | ICD-10-CM

## 2023-04-09 DIAGNOSIS — R109 Unspecified abdominal pain: Secondary | ICD-10-CM

## 2023-04-09 DIAGNOSIS — R079 Chest pain, unspecified: Secondary | ICD-10-CM

## 2023-04-09 DIAGNOSIS — I10 Essential (primary) hypertension: Secondary | ICD-10-CM

## 2023-04-09 DIAGNOSIS — Z0289 Encounter for other administrative examinations: Secondary | ICD-10-CM

## 2023-04-09 DIAGNOSIS — G062 Extradural and subdural abscess, unspecified: Secondary | ICD-10-CM

## 2023-04-09 DIAGNOSIS — Z452 Encounter for adjustment and management of vascular access device: Secondary | ICD-10-CM

## 2023-04-09 DIAGNOSIS — M7061 Trochanteric bursitis, right hip: Secondary | ICD-10-CM

## 2023-04-09 DIAGNOSIS — G2581 Restless legs syndrome: Secondary | ICD-10-CM

## 2023-04-09 DIAGNOSIS — E871 Hypo-osmolality and hyponatremia: Secondary | ICD-10-CM

## 2023-04-09 DIAGNOSIS — I5032 Chronic diastolic (congestive) heart failure: Secondary | ICD-10-CM

## 2023-04-09 DIAGNOSIS — K219 Gastro-esophageal reflux disease without esophagitis: Secondary | ICD-10-CM

## 2023-04-09 DIAGNOSIS — Z794 Long term (current) use of insulin: Secondary | ICD-10-CM

## 2023-04-09 DIAGNOSIS — T8859XA Other complications of anesthesia, initial encounter: Secondary | ICD-10-CM

## 2023-04-09 DIAGNOSIS — G43819 Other migraine, intractable, without status migrainosus: Secondary | ICD-10-CM

## 2023-04-09 DIAGNOSIS — R0902 Hypoxemia: Secondary | ICD-10-CM

## 2023-04-09 DIAGNOSIS — T8130XA Disruption of wound, unspecified, initial encounter: Secondary | ICD-10-CM

## 2023-04-09 DIAGNOSIS — Z803 Family history of malignant neoplasm of breast: Secondary | ICD-10-CM

## 2023-04-09 DIAGNOSIS — I4729 Other ventricular tachycardia: Secondary | ICD-10-CM

## 2023-04-09 DIAGNOSIS — I249 Acute ischemic heart disease, unspecified: Secondary | ICD-10-CM

## 2023-04-09 DIAGNOSIS — R4781 Slurred speech: Secondary | ICD-10-CM

## 2023-04-09 DIAGNOSIS — R0600 Dyspnea, unspecified: Secondary | ICD-10-CM

## 2023-04-09 DIAGNOSIS — D638 Anemia in other chronic diseases classified elsewhere: Secondary | ICD-10-CM

## 2023-04-09 DIAGNOSIS — E1140 Type 2 diabetes mellitus with diabetic neuropathy, unspecified: Secondary | ICD-10-CM

## 2023-04-09 DIAGNOSIS — A498 Other bacterial infections of unspecified site: Secondary | ICD-10-CM

## 2023-04-09 DIAGNOSIS — R0602 Shortness of breath: Secondary | ICD-10-CM

## 2023-04-09 DIAGNOSIS — K760 Fatty (change of) liver, not elsewhere classified: Secondary | ICD-10-CM

## 2023-04-09 DIAGNOSIS — I739 Peripheral vascular disease, unspecified: Secondary | ICD-10-CM

## 2023-04-09 DIAGNOSIS — Z87442 Personal history of urinary calculi: Secondary | ICD-10-CM

## 2023-04-09 DIAGNOSIS — M1990 Unspecified osteoarthritis, unspecified site: Secondary | ICD-10-CM

## 2023-04-09 DIAGNOSIS — J9611 Chronic respiratory failure with hypoxia: Secondary | ICD-10-CM

## 2023-04-09 DIAGNOSIS — Z9689 Presence of other specified functional implants: Secondary | ICD-10-CM

## 2023-04-09 DIAGNOSIS — Z8719 Personal history of other diseases of the digestive system: Secondary | ICD-10-CM

## 2023-04-09 DIAGNOSIS — M461 Sacroiliitis, not elsewhere classified: Secondary | ICD-10-CM

## 2023-04-09 DIAGNOSIS — I110 Hypertensive heart disease with heart failure: Secondary | ICD-10-CM

## 2023-04-09 NOTE — Patient Instructions (Signed)
Medication Instructions:  Your physician recommends that you continue on your current medications as directed. Please refer to the Current Medication list given to you today.  *If you need a refill on your cardiac medications before your next appointment, please call your pharmacy*   Lab Work: None ordered If you have labs (blood work) drawn today and your tests are completely normal, you will receive your results only by: MyChart Message (if you have MyChart) OR A paper copy in the mail If you have any lab test that is abnormal or we need to change your treatment, we will call you to review the results.   Testing/Procedures: None ordered   Follow-Up: At [facility], you and your health needs are our priority.  As part of our continuing mission to provide you with exceptional heart care, we have created designated Provider Care Teams.  These Care Teams include your primary Cardiologist (physician) and Advanced Practice Providers (APPs -  Physician Assistants and Nurse Practitioners) who all work together to provide you with the care you need, when you need it.  We recommend signing up for the patient portal called "MyChart".  Sign up information is provided on this After Visit Summary.  MyChart is used to connect with patients for Virtual Visits (Telemedicine).  Patients are able to view lab/test results, encounter notes, upcoming appointments, etc.  Non-urgent messages can be sent to your provider as well.   To learn more about what you can do with MyChart, go to [email or web address].    Your next appointment:   3 month(s)  The format for your next appointment:   In Person  Provider:   Rajan Revankar, MD    Other Instructions none  Important Information About Sugar      

## 2023-04-16 ENCOUNTER — Other Ambulatory Visit: Payer: Self-pay | Admitting: Student

## 2023-04-18 ENCOUNTER — Other Ambulatory Visit: Payer: Self-pay | Admitting: Student

## 2023-04-20 ENCOUNTER — Other Ambulatory Visit: Payer: Self-pay | Admitting: Student

## 2023-04-24 ENCOUNTER — Ambulatory Visit: Payer: PPO | Admitting: Family

## 2023-04-24 ENCOUNTER — Other Ambulatory Visit: Payer: Self-pay

## 2023-04-24 ENCOUNTER — Encounter: Payer: Self-pay | Admitting: Family

## 2023-04-24 VITALS — BP 127/80 | HR 83 | Temp 98.2°F | Wt 196.8 lb

## 2023-04-24 DIAGNOSIS — T85733D Infection and inflammatory reaction due to implanted electronic neurostimulator of spinal cord, electrode (lead), subsequent encounter: Secondary | ICD-10-CM | POA: Diagnosis not present

## 2023-04-24 DIAGNOSIS — M4645 Discitis, unspecified, thoracolumbar region: Secondary | ICD-10-CM

## 2023-04-24 NOTE — Patient Instructions (Addendum)
Nice to see you.  We will check your lab work today.  Continue to take your medication daily as prescribed.  Refills have been sent to the pharmacy.  Plan for follow up in 3 months or sooner if needed with lab work on the same day.  Have a great day and stay safe!  

## 2023-04-24 NOTE — Progress Notes (Signed)
Subjective:    Patient ID: Katelyn [name], female    DOB: 23-Aug-1956, 67 y.o.   MRN: [id number]  Chief Complaint  Patient presents with   Follow-up    HPI:  Katelyn [name] is a 67 y.o. female with prior postoperative infection with Serratia in 2017 and recently T11-T12 discitis/osteomyelitis and epidural phlegmon from T8-T9 and L3-L2 last seen on 03/28/23 at the completion of 6 weeks of IV with ertapenem and daptomycin and transitioned to ciprofloxacin and doxycycline for suppression. Here today for follow up.  Katelyn [name] is present today with her husband and has been doing well since her last office visit and has been taking the ciprofloxacin and doxycycline. Did have one episode of nausea/vomiting when she took the medications on an empty stomach but otherwise has tolerated well and denies any diarrhea, tendon pain, fevers, or chills. Back pain is improved since last office visit and continues to ambulate with the assistance of a walker and is attending physical therapy.   Allergies  Allergen Reactions   Atorvastatin Nausea And Vomiting and Other (See Comments)    MYALGIAS   Talwin [Pentazocine] Other (See Comments)    headache   Gabapentin Swelling   Other Nausea Only    UNSPECIFIED Anesthesia   Cefepime Other (See Comments)    Presumed AMS/neurotoxicity in the setting of AKI      Outpatient Medications Prior to Visit  Medication Sig Dispense Refill   carvedilol (COREG) 6.25 MG tablet Take 1 tablet (6.25 mg total) by mouth 2 (two) times daily. Take 6.25 mg by mouth 2 (two) times daily. / Needs appointment for further refills 60 tablet 0   ciprofloxacin (CIPRO) 500 MG tablet Take 1 tablet (500 mg total) by mouth 2 (two) times daily. Start on 04/04/23 60 tablet 2   dapagliflozin propanediol (FARXIGA) 5 MG TABS tablet Take 10 mg by mouth daily.     doxycycline (VIBRA-TABS) 100 MG tablet Take 1 tablet (100 mg total) by mouth 2 (two) times daily. Start taking 04/04/23 60 tablet 5   DULoxetine  (CYMBALTA) 60 MG capsule Take 60 mg by mouth at bedtime.      furosemide (LASIX) 20 MG tablet Take 40 mg by mouth daily.     insulin aspart protamine - aspart (NOVOLOG 70/30 MIX) (70-30) 100 UNIT/ML FlexPen Inject 10-12 Units into the skin 2 (two) times daily with a meal.     mirtazapine (REMERON) 15 MG tablet Take 30 mg by mouth at bedtime.     Multiple Vitamins-Minerals (HAIR SKIN AND NAILS FORMULA) TABS Take 1 tablet by mouth daily.     nitroGLYCERIN (NITROSTAT) 0.4 MG SL tablet Place 0.4 mg under the tongue every 5 (five) minutes as needed for chest pain.     oxyCODONE (OXY IR/ROXICODONE) 5 MG immediate release tablet Take 0.5 tablets (2.5 mg total) by mouth every 6 (six) hours as needed for severe pain. 15 tablet 0   pantoprazole (PROTONIX) 40 MG tablet Take 1 tablet by mouth once daily 90 tablet 2   pramipexole (MIRAPEX) 1 MG tablet Take 1 mg by mouth at bedtime.     pregabalin (LYRICA) 75 MG capsule Take 75 mg by mouth at bedtime.     TRESIBA FLEXTOUCH 200 UNIT/ML FlexTouch Pen Inject 20 Units into the skin at bedtime. 3 mL 0   DAPTOmycin (CUBICIN) 500 MG injection Inject 500 mg into the vein daily.     No facility-administered medications prior to visit.     Past  Medical History:  Diagnosis Date   Anxiety    Arthritis    Bursitis of right hip    CAD (coronary artery disease)    Cardiac catheterization June 2014 in [place] - 50% circumflex stenosis   Chest pain, neg MI, stable CAD non obstructive on cath 10/05/20 10/04/2020   Chronic diastolic heart failure (HCC) 08/20/2017   Chronic kidney disease, stage 3 (HCC)    does not see nephrologist   Chronic low back pain without sciatica 03/14/2016   CKD (chronic kidney disease), stage III (HCC) 10/07/2020   Complication of anesthesia    Cough 04/19/2017   Overview:  Last Assessment & Plan:  Formatting of this note may be different from the original. Cough - ? ACE related with AR triggers   Plan  Patient Instructions  Discuss with  your primary doctor that lisinopril pain, need making your cough worse. May use Mucinex DM twice daily as needed for cough and congestion Zyrtec 10 mg at bedtime as needed for drainage Saline nasal spray as needed. Lab tests today Activity as tolerated. Follow with Dr. Craige Cotta in 3-4 months and As needed   Please contact office for sooner follow up if symptoms do not improve or worsen or seek emergency care    Depression    Dyspnea    with exertion   " lazy lung" - per  Dr Craige Cotta from back issues- 06/2016   Elevated liver enzymes 12/05/2016   Essential hypertension    GERD (gastroesophageal reflux disease)    Gout 03/14/2016   Greater trochanteric bursitis of right hip 02/02/2012   H/O hiatal hernia    Heart murmur    History of blood transfusion 2016   History of esophageal stricture 10/07/2020   History of kidney stones    Hypercholesterolemia    Hypertensive heart disease with heart failure (HCC) 01/01/2017   Hypoxia 10/07/2020   Iliotibial band syndrome of right side 02/02/2012   Iron deficiency anemia due to chronic blood loss 03/14/2016   LBBB (left bundle branch block) 01/01/2017   Left bundle branch block    Leg weakness 10/07/2020   Lumbar stenosis    Meralgia paraesthetica 12/05/2016   Mild CAD 11/24/2015   Morbid obesity (HCC) 10/07/2020   Neuropathy    OSA (obstructive sleep apnea) 05/24/2016   Overview:  Managed PULM   PONV (postoperative nausea and vomiting)    "no N/V with patch"   Restless leg syndrome    S/P lumbar laminectomy 11/26/2015   S/P lumbar spinal fusion 08/29/2016   Tinnitus 12/05/2016   Type 2 diabetes mellitus (HCC)    UTI (urinary tract infection) 10/07/2020     Past Surgical History:  Procedure Laterality Date   ABDOMINAL HYSTERECTOMY  1983   APPENDECTOMY  Age 80   BACK SURGERY     FIRST LUMBAR FUSION/ SURGERY APRIL 2012 AND FUSION WITH INSTRUMENTATION SEPT 2012   CARDIAC CATHETERIZATION     x 2   CARPAL TUNNEL RELEASE     bil   CHOLECYSTECTOMY  1990's    COLONOSCOPY  12/12/2017   Colonic polyp status post polypectomy. Mild sigmoid diverticulosis. Otherwise normal colonoscopy to terminal ileum.   CYSTO EXTRACTION KIDNEY STONES     ESOPHAGOGASTRODUODENOSCOPY  12/12/2017   Small hiatal hernia. Mild gastritis. Status post esophageal dilatation.   EXCISION/RELEASE BURSA HIP  02/02/2012   Procedure: EXCISION/RELEASE BURSA HIP;  Surgeon: Jacki Cones, MD;  Location: [facility];  Service: Orthopedics;  Laterality: Right;  Right Hip  Bursectomy   EYE SURGERY Bilateral    cataracts   IR THORACIC DISC ASPIRATION W/IMG GUIDE  02/14/2023   KIDNEY STONE SURGERY  2008   LAMINECTOMY WITH POSTERIOR LATERAL ARTHRODESIS LEVEL 4 N/A 02/19/2023   Procedure: THORACIC TEN-LUMBAR TWO INSTRUMENTED FUSION;  Surgeon: Tia Alert, MD;  Location: [facility] OR;  Service: Neurosurgery;  Laterality: N/A;   Left knee surgery x 2  1996   reconstruction   LUMBAR DISC SURGERY  08/2016   LUMBAR LAMINECTOMY/DECOMPRESSION MICRODISCECTOMY Right 11/26/2015   Procedure: Extraforaminal Microdiscectomy  - Lumbar two-three- right;  Surgeon: Tia Alert, MD;  Location: [facility];  Service: Neurosurgery;  Laterality: Right;  right    LUMBAR WOUND DEBRIDEMENT N/A 10/25/2016   Procedure: Lumbar wound revision;  Surgeon: Tia Alert, MD;  Location: [facility] OR;  Service: Neurosurgery;  Laterality: N/A;  Lumbar wound revision   Right shoulder surgery  2010   spur   RIGHT/LEFT HEART CATH AND CORONARY ANGIOGRAPHY N/A 10/05/2020   Procedure: RIGHT/LEFT HEART CATH AND CORONARY ANGIOGRAPHY;  Surgeon: Swaziland, Peter M, MD;  Location: [facility] INVASIVE CV LAB;  Service: Cardiovascular;  Laterality: N/A;   SPINAL CORD STIMULATOR REMOVAL  02/19/2023   Procedure: LUMBAR SPINAL CORD STIMULATOR REMOVAL;  Surgeon: Tia Alert, MD;  Location: [facility] OR;  Service: Neurosurgery;;       Review of Systems  Constitutional:  Negative for chills, diaphoresis, fatigue and fever.  Respiratory:  Negative for cough, chest  tightness, shortness of breath and wheezing.   Cardiovascular:  Negative for chest pain.  Gastrointestinal:  Negative for abdominal pain, diarrhea, nausea and vomiting.  Musculoskeletal:  Positive for back pain.      Objective:    BP 127/80   Pulse 83   Temp 98.2 F (36.8 C) (Oral)   Wt 196 lb 12.8 oz (89.3 kg)   SpO2 96%   BMI 36.00 kg/m  Nursing note and vital signs reviewed.  Physical Exam Constitutional:      General: She is not in acute distress.    Appearance: She is well-developed.  Cardiovascular:     Rate and Rhythm: Normal rate and regular rhythm.     Heart sounds: Normal heart sounds.  Pulmonary:     Effort: Pulmonary effort is normal.     Breath sounds: Normal breath sounds.  Skin:    General: Skin is warm and dry.  Neurological:     Mental Status: She is alert.         02/08/2023    1:58 PM 12/26/2022    3:43 PM  Depression screen PHQ 2/9  Decreased Interest 0 0  Down, Depressed, Hopeless 0 0  PHQ - 2 Score 0 0       Assessment & Plan:    Patient Active Problem List   Diagnosis Date Noted   ACS (acute coronary syndrome) (HCC) 04/06/2023   Acute sciatica 04/06/2023   Dyslipidemia 04/06/2023   Hypertensive heart and chronic kidney disease 04/06/2023   Hyponatremia 04/06/2023   Hypothermia 04/06/2023   Renal insufficiency 04/06/2023   SIRS (systemic inflammatory response syndrome) (HCC) 04/06/2023   Slurred speech 04/06/2023   Thrombocytopenia (HCC) 04/06/2023   Ventricular tachycardia, nonsustained (HCC) 04/06/2023   PICC (peripherally inserted central catheter) in place 03/28/2023   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   Acute renal failure (ARF) (HCC) 03/13/2023   Elevated LFTs 03/13/2023   AKI (acute kidney injury) (HCC) 02/20/2023   Status post thoracic spinal fusion 02/19/2023   Discitis 02/13/2023  Malfunction of spinal cord stimulator (HCC) 02/13/2023   Serratia 02/13/2023   Diabetes mellitus without complication (HCC) 02/13/2023    Osteomyelitis (HCC) 02/05/2023   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   Infection of spinal cord stimulator (HCC) 12/07/2022   Lactic acidosis 12/06/2022   Hypomagnesemia 12/06/2022   Anemia of chronic disease 12/06/2022   Diskitis 12/05/2022   Status post insertion of spinal cord stimulator 11/22/2022   Acute thoracic back pain 06/22/2022   Other long term (current) drug therapy 06/22/2022   Lumbar spondylosis 06/22/2022   Thoracic spondylosis 06/22/2022   Bilateral sacroiliitis (HCC) 06/22/2022   Chronic, continuous use of opioids 05/31/2022   Pain medication agreement 05/02/2022   Postlaminectomy syndrome of lumbar region 05/02/2022   Radiculopathy of thoracolumbar region 05/02/2022   Class 2 severe obesity due to excess calories with serious comorbidity and body mass index (BMI) of 37.0 to 37.9 in adult [facility]) 05/02/2022   Mastodynia 04/13/2022   Peripheral artery disease (HCC) 07/11/2021   COVID-19 virus infection 06/21/2021   Rib pain on left side 06/06/2021   Abdominal pain 06/05/2021   Diabetes 1.5, 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   GAD (generalized anxiety disorder) 11/25/2020   Anxiety and depression 11/03/2020   DM2 (diabetes mellitus, type 2) (HCC)    Restless leg syndrome    PONV (postoperative nausea and vomiting)    Neuropathy    Lumbar stenosis    Left bundle branch block    Hypercholesterolemia    Heart murmur    History of kidney stones    H/O hiatal hernia    Essential hypertension    Dyspnea    Chronic depression    Complication of anesthesia    CKD stage 3b, GFR 30-44 ml/min (HCC)    CAD (coronary artery disease)    Bursitis of right hip    Arthritis    Anxiety    Morbid obesity (HCC) 10/07/2020   Hypoxia 10/07/2020   History of esophageal stricture 10/07/2020   CKD (chronic kidney disease), stage III (HCC) 10/07/2020   Leg weakness 10/07/2020   UTI (urinary tract infection) 10/07/2020   Chronic hypoxemic  respiratory failure (HCC) 10/07/2020   Pulmonary hypertension (HCC) 10/06/2020   Chest pain, neg MI, stable CAD non obstructive on cath 10/05/20 10/04/2020   Hardening of the aorta (main artery of the heart) (HCC) 11/25/2019   Hypertensive heart disease with congestive heart failure and chronic kidney disease (HCC) 11/25/2019   Shortness of breath 06/11/2019   Fatty liver 04/08/2019   Migraine 04/08/2019   History of chronic kidney disease 04/08/2019   Acute non-recurrent sinusitis 10/12/2017   Chronic diastolic CHF (congestive heart failure) (HCC) 08/20/2017   Genetic testing 08/17/2017   Family history of ovarian cancer 08/17/2017   Family history of breast cancer 08/17/2017   Cough 04/19/2017   DOE (dyspnea on exertion) 04/19/2017   Type 2 diabetes mellitus with diabetic neuropathy, unspecified (HCC) 01/16/2017   Hypertensive heart disease with heart failure (HCC) 01/01/2017   Hyperlipidemia 01/01/2017   LBBB (left bundle branch block) 01/01/2017   Elevated liver enzymes 12/05/2016   Meralgia paraesthetica 12/05/2016   Tinnitus 12/05/2016   Spinal stenosis of lumbar region with neurogenic claudication 12/05/2016   Wound dehiscence 10/23/2016   S/P lumbar spinal fusion 08/29/2016   OSA (obstructive sleep apnea) 05/24/2016   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   Chronic low back pain without sciatica 03/14/2016   GERD (gastroesophageal reflux disease) 03/14/2016   Gout  03/14/2016   Iron deficiency anemia due to chronic blood loss 03/14/2016   Type 2 diabetes mellitus without complication, with long-term current use of insulin (HCC) 03/14/2016   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HCC) 12/07/2015   Restless leg 12/07/2015   Insomnia 12/07/2015   S/P lumbar laminectomy 11/26/2015   Postprocedural state 11/26/2015   Mild CAD 11/24/2015   Lumbar radiculopathy 10/15/2015   History of blood transfusion 2016   Kidney stone 04/20/2014   Stricture of esophagus 04/20/2014    Greater trochanteric bursitis of right hip 02/02/2012   Iliotibial band syndrome of right side 02/02/2012   Iliotibial band syndrome 02/02/2012   Bursitis, trochanteric 02/02/2012     Problem List Items Addressed This Visit       Musculoskeletal and Integument   Discitis - Primary    Katelyn [name] has is tolerating the ciprofloxacin and doxycycline with no significant adverse side effects with one episode of nausea/vomiting related to food intake with medication. Reviewed plan of care to continue with antibiotics likely for a minimum of a year and possibly indefinitely given her retained hardware and the potential for infection. Check renal function and inflammatory markers. Continue current dose of ciprofloxacin and doxycycline. Plan for follow up in 3 months or sooner if needed with lab work on the same day for therapeutic drug monitoring.       Relevant Orders   BASIC METABOLIC PANEL WITH GFR   Sedimentation rate   C-reactive protein     Other   Infection of spinal cord stimulator (HCC)     I have discontinued Leylany B. Mcneill's DAPTOmycin. I am also having her maintain her DULoxetine, mirtazapine, nitroGLYCERIN, pramipexole, carvedilol, Hair Skin and Nails Formula, pregabalin, pantoprazole, [name] FlexTouch, insulin aspart protamine - aspart, oxyCODONE, ciprofloxacin, doxycycline, dapagliflozin propanediol, and furosemide.   Follow-up: Return in about 3 months (around 07/25/2023), or if symptoms worsen or fail to improve.   Marcos Eke, MSN, FNP-C Nurse Practitioner [facility] Main number: [phone]

## 2023-04-24 NOTE — Assessment & Plan Note (Signed)
Katelyn [name] has is tolerating the ciprofloxacin and doxycycline with no significant adverse side effects with one episode of nausea/vomiting related to food intake with medication. Reviewed plan of care to continue with antibiotics likely for a minimum of a year and possibly indefinitely given her retained hardware and the potential for infection. Check renal function and inflammatory markers. Continue current dose of ciprofloxacin and doxycycline. Plan for follow up in 3 months or sooner if needed with lab work on the same day for therapeutic drug monitoring.

## 2023-04-25 LAB — BASIC METABOLIC PANEL WITH GFR
BUN/Creatinine Ratio: 18 (calc) (ref 6–22)
BUN: 26 mg/dL — ABNORMAL HIGH (ref 7–25)
CO2: 34 mmol/L — ABNORMAL HIGH (ref 20–32)
Calcium: 9 mg/dL (ref 8.6–10.4)
Chloride: 94 mmol/L — ABNORMAL LOW (ref 98–110)
Creat: 1.45 mg/dL — ABNORMAL HIGH (ref 0.50–1.05)
Glucose, Bld: 256 mg/dL — ABNORMAL HIGH (ref 65–99)
Potassium: 4.6 mmol/L (ref 3.5–5.3)
Sodium: 134 mmol/L — ABNORMAL LOW (ref 135–146)
eGFR: 40 mL/min/{1.73_m2} — ABNORMAL LOW (ref >=60)

## 2023-04-25 LAB — C-REACTIVE PROTEIN: CRP: 6.3 mg/L (ref <8.0)

## 2023-04-25 LAB — SEDIMENTATION RATE: Sed Rate: 89 mm/h — ABNORMAL HIGH (ref 0–30)

## 2023-05-10 ENCOUNTER — Encounter: Payer: Self-pay | Admitting: Pharmacist Clinician (PhC)/ Clinical Pharmacy Specialist

## 2023-05-10 ENCOUNTER — Ambulatory Visit: Payer: PPO | Attending: Cardiovascular Disease | Admitting: Pharmacist Clinician (PhC)/ Clinical Pharmacy Specialist

## 2023-05-10 DIAGNOSIS — E7800 Pure hypercholesterolemia, unspecified: Secondary | ICD-10-CM | POA: Diagnosis not present

## 2023-05-10 NOTE — Patient Instructions (Signed)
Your Results:             Your most recent labs Goal  Total Cholesterol 178 < 200  Triglycerides 132 < 150  HDL (happy/good cholesterol) 51 > 40  LDL (lousy/bad cholesterol 104 < 70   Medication changes:  We will start the process to get Leqvio covered by your insurance.  Once the prior authorization is complete, you will hear from the infusion center on [address] [facility].   You will do the first 2 injections 3 months apart, then every 6 months thereafter  Lab orders:  We want to repeat labs after 4 months.  We will send you a lab order to remind you once we get closer to that time.     Thank you for choosing [facility]

## 2023-05-10 NOTE — Progress Notes (Signed)
Office Visit    Patient Name: Katelyn [name] Date of Encounter: 05/11/2023  Primary Care Provider:  Olive Bass, MD Primary Cardiologist:  Norman Herrlich, MD  Chief Complaint    Hyperlipidemia   Significant Past Medical History   CAD Cath 12/21 - 55% 1st diag and 40 % dist Cx  CHF Chronic diastsolic  CKD  Stage 3  SCr 1.45  GFR 40  gout Was on allopurinol prior to hospital in June (was getting about 4 times per year     Allergies  Allergen Reactions   Atorvastatin Nausea And Vomiting and Other (See Comments)    MYALGIAS   Talwin [Pentazocine] Other (See Comments)    headache   Gabapentin Swelling   Other Nausea Only    UNSPECIFIED Anesthesia   Cefepime Other (See Comments)    Presumed AMS/neurotoxicity in the setting of AKI    History of Present Illness    Katelyn [name] is a 67 y.o. female patient of Dr Dulce Sellar, in the office today to discuss options for cholesterol management.  Insurance Carrier:  [employer]  LDL Cholesterol goal:  LDL < 70   Current Medications:   none  Previously tried:  atorvastatin, rosuvastatin, pravastatin - all caused myalgias  Family Hx:  mother died MI at 86, father HTN, died lung cancer;  4 sisters (2 deceased - both cancer) no heart disease; son died in car accident at 27   Social Hx: Tobacco: no Alcohol:  only rare occasions    Diet:   eats out regularly, was getting meals from [facility] after discharge (renal diet), didn't care for those; Hortense Ramal for breakfast; [employer]; getting some vegetables and fruits; snacking at home - chips, cookies, crackers, candy   Exercise: walking some with walker  Accessory Clinical Findings   Lab Results  Component Value Date   CHOL 178 11/16/2021   HDL 51 11/16/2021   LDLCALC 104 (H) 11/16/2021   TRIG 132 11/16/2021   CHOLHDL 3.5 11/16/2021    No results found for: "LIPOA"  Lab Results  Component Value Date   ALT 70 (H) 03/17/2023   AST 90 (H) 03/17/2023   ALKPHOS  262 (H) 03/17/2023   BILITOT 0.7 03/17/2023   Lab Results  Component Value Date   CREATININE 1.45 (H) 04/24/2023   BUN 26 (H) 04/24/2023   NA 134 (L) 04/24/2023   K 4.6 04/24/2023   CL 94 (L) 04/24/2023   CO2 34 (H) 04/24/2023   Lab Results  Component Value Date   HGBA1C 7.6 (H) 03/14/2023    Home Medications    Current Outpatient Medications  Medication Sig Dispense Refill   carvedilol (COREG) 6.25 MG tablet Take 1 tablet (6.25 mg total) by mouth 2 (two) times daily. Take 6.25 mg by mouth 2 (two) times daily. / Needs appointment for further refills 60 tablet 0   ciprofloxacin (CIPRO) 500 MG tablet Take 1 tablet (500 mg total) by mouth 2 (two) times daily. Start on 04/04/23 60 tablet 2   dapagliflozin propanediol (FARXIGA) 5 MG TABS tablet Take 10 mg by mouth daily.     doxycycline (VIBRA-TABS) 100 MG tablet Take 1 tablet (100 mg total) by mouth 2 (two) times daily. Start taking 04/04/23 60 tablet 5   DULoxetine (CYMBALTA) 60 MG capsule Take 60 mg by mouth at bedtime.      furosemide (LASIX) 20 MG tablet Take 40 mg by mouth daily.     insulin aspart protamine -  aspart (NOVOLOG 70/30 MIX) (70-30) 100 UNIT/ML FlexPen Inject 10-12 Units into the skin 2 (two) times daily with a meal.     mirtazapine (REMERON) 15 MG tablet Take 30 mg by mouth at bedtime.     Multiple Vitamins-Minerals (HAIR SKIN AND NAILS FORMULA) TABS Take 1 tablet by mouth daily.     nitroGLYCERIN (NITROSTAT) 0.4 MG SL tablet Place 0.4 mg under the tongue every 5 (five) minutes as needed for chest pain.     oxyCODONE (OXY IR/ROXICODONE) 5 MG immediate release tablet Take 0.5 tablets (2.5 mg total) by mouth every 6 (six) hours as needed for severe pain. 15 tablet 0   pantoprazole (PROTONIX) 40 MG tablet Take 1 tablet by mouth once daily 90 tablet 2   pramipexole (MIRAPEX) 1 MG tablet Take 1 mg by mouth at bedtime.     pregabalin (LYRICA) 75 MG capsule Take 75 mg by mouth at bedtime.     TRESIBA FLEXTOUCH 200 UNIT/ML  FlexTouch Pen Inject 20 Units into the skin at bedtime. 3 mL 0   No current facility-administered medications for this visit.     Assessment & Plan    Hypercholesterolemia Assessment: Patient with ASCVD not at LDL goal of < 70 Most recent LDL 110 on 08/2022 Not able to tolerate statins secondary to myalgias Reviewed options for lowering LDL cholesterol, including ezetimibe, PCSK-9 inhibitors, bempedoic acid and inclisiran.  Discussed mechanisms of action, dosing, side effects, potential decreases in LDL cholesterol and costs.  Also reviewed potential options for patient assistance.  Plan: Patient agreeable to starting Leqvio Repeat labs after:  4 months Lipid Liver function    Phillips Hay, PharmD CPP [facility] [address]  [place], [place] [address] [phone]  05/11/2023, 6:38 AM

## 2023-05-10 NOTE — Assessment & Plan Note (Addendum)
Assessment: Patient with ASCVD not at LDL goal of < 70 Most recent LDL 110 on 08/2022 Not able to tolerate statins secondary to myalgias Reviewed options for lowering LDL cholesterol, including ezetimibe, PCSK-9 inhibitors, bempedoic acid and inclisiran.  Discussed mechanisms of action, dosing, side effects, potential decreases in LDL cholesterol and costs.  Also reviewed potential options for patient assistance.  Plan: Patient agreeable to starting Leqvio Repeat labs after:  4 months Lipid Liver function

## 2023-05-16 ENCOUNTER — Other Ambulatory Visit: Payer: Self-pay | Admitting: Pharmacist Clinician (PhC)/ Clinical Pharmacy Specialist

## 2023-05-22 ENCOUNTER — Telehealth: Payer: Self-pay

## 2023-05-22 NOTE — Telephone Encounter (Signed)
Auth Submission: APPROVED Site of care: Site of care: [facility] Payer: [employer] Medication & CPT/J Code(s) submitted: Leqvio (Inclisiran) [id number] Route of submission (phone, fax, portal): Fax  Auth type: Buy/Bill HB Units/visits requested: 284mg  q 3 months x 2 doses, then 284mg  q 6 months Reference number: [id number] Approval from: 05/17/2023 to 08/15/2023

## 2023-05-30 ENCOUNTER — Ambulatory Visit (INDEPENDENT_AMBULATORY_CARE_PROVIDER_SITE_OTHER): Payer: PPO

## 2023-05-30 VITALS — BP 148/80 | HR 85 | Temp 98.1°F | Resp 18 | Ht 61.0 in | Wt 190.0 lb

## 2023-05-30 DIAGNOSIS — E7800 Pure hypercholesterolemia, unspecified: Secondary | ICD-10-CM | POA: Diagnosis not present

## 2023-05-30 MED ORDER — INCLISIRAN SODIUM 284 MG/1.5ML ~~LOC~~ SOSY
284.0000 mg | PREFILLED_SYRINGE | Freq: Once | SUBCUTANEOUS | Status: AC
  Administered 2023-05-30: 284 mg via SUBCUTANEOUS
  Filled 2023-05-30: qty 1.5

## 2023-05-30 NOTE — Patient Instructions (Signed)
 Inclisiran Injection What is this medication? INCLISIRAN (in kli SIR an) treats high cholesterol. It works by decreasing bad cholesterol (such as LDL) in your blood. Changes to diet and exercise are often combined with this medication. This medicine may be used for other purposes; ask your health care provider or pharmacist if you have questions. COMMON BRAND NAME(S): LEQVIO What should I tell my care team before I take this medication? They need to know if you have any of these conditions: An unusual or allergic reaction to inclisiran, other medications, foods, dyes, or preservatives Pregnant or trying to get pregnant Breast-feeding How should I use this medication? This medication is injected under the skin. It is given by your care team in a hospital or clinic setting. Talk to your care team about the use of this medication in children. Special care may be needed. Overdosage: If you think you have taken too much of this medicine contact a poison control center or emergency room at once. NOTE: This medicine is only for you. Do not share this medicine with others. What if I miss a dose? Keep appointments for follow-up doses. It is important not to miss your dose. Call your care team if you are unable to keep an appointment. What may interact with this medication? Interactions are not expected. This list may not describe all possible interactions. Give your health care provider a list of all the medicines, herbs, non-prescription drugs, or dietary supplements you use. Also tell them if you smoke, drink alcohol, or use illegal drugs. Some items may interact with your medicine. What should I watch for while using this medication? Visit your care team for regular checks on your progress. Tell your care team if your symptoms do not start to get better or if they get worse. You may need blood work while you are taking this medication. What side effects may I notice from receiving this  medication? Side effects that you should report to your care team as soon as possible: Allergic reactions--skin rash, itching, hives, swelling of the face, lips, tongue, or throat Side effects that usually do not require medical attention (report these to your care team if they continue or are bothersome): Joint pain Pain, redness, or irritation at injection site This list may not describe all possible side effects. Call your doctor for medical advice about side effects. You may report side effects to FDA at 1-800-FDA-1088. Where should I keep my medication? This medication is given in a hospital or clinic. It will not be stored at home. NOTE: This sheet is a summary. It may not cover all possible information. If you have questions about this medicine, talk to your doctor, pharmacist, or health care provider.  2024 Elsevier/Gold Standard (2022-04-21 00:00:00)

## 2023-05-30 NOTE — Progress Notes (Signed)
Diagnosis: Hyperlipidemia  Provider:  Mannam, Praveen MD  Procedure: Injection  Leqvio (inclisiran), Dose: 284 mg, Site: subcutaneous, Number of injections: 1  Post Care: Observation period completed  Discharge: Condition: Good, Destination: Home . AVS Provided  Performed by:  Sunita Ranabhat, RN       

## 2023-06-06 ENCOUNTER — Other Ambulatory Visit: Payer: Self-pay | Admitting: Cardiology

## 2023-07-08 NOTE — Progress Notes (Unsigned)
Cardiology Office Note:  .   Date:  07/08/2023  ID:  Katelyn [name], DOB 07-03-1956, MRN [id number] PCP: Olive Bass, MD  [facility] Providers Cardiologist:  Norman Herrlich, MD    History of Present Illness: .   Katelyn [name] is a 67 y.o. female with a past medical history of nonobstructive CAD LHC 2021, hypertension, LBBB, chronic diastolic heart failure, PAD, pulmonary hypertension, NSVT, OSA, restrictive lung disease, GERD, DM 2, CKD stage III, thrombocytopenia, hyperlipidemia, cirrhosis.  Most recently evaluated by Dr. Dulce Sellar on 01/08/2023, at this time she was volume overloaded, she was started on torsemide twice daily.  She was admitted to [facility] on 03/13/2023 with altered mental status and found to be in acute renal failure creatinine elevated to 4.06, BUN 95.  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  She was hyponatremic although this had been somewhat of an ongoing issue for her for some time.  EF revealed mild LVH, EF 40 to 45%, elevated LA and LV end-diastolic pressures.  She presents today accompanied by her husband for follow-up after hospitalization as outlined above.  She is working with OT and PT at home.  Has an appointment to establish with a nephrologist tomorrow, and appointment to establish with a liver specialist, will see her PCP again on 7/12, and see infectious disease in July as well.  Has been evaluated by PCP x 1 since she was discharged, repeat lab work was obtained and revealed Evaluated by PCP on 03/20/2023 sodium 141, potassium 4.3, creatinine 1.62, GFR 35, elevated LFTs. She is feeling better, still very weak, using a walker for ambulation. She denies chest pain, palpitations, dyspnea, pnd, orthopnea, n, v, dizziness, syncope, edema, weight gain, or early satiety.  ROS:  Review of Systems  Constitutional: Negative.   HENT: Negative.    Eyes: Negative.   Respiratory: Negative.    Cardiovascular:  Positive for leg swelling. Negative for chest pain, palpitations, orthopnea and PND.  Gastrointestinal: Negative.   Genitourinary: Negative.   Musculoskeletal:  Positive for back pain.  Skin: Negative.   Neurological: Negative.   Endo/Heme/Allergies: Negative.   Psychiatric/Behavioral: Negative.       Studies Reviewed: .        Cardiac Studies & Procedures   CARDIAC CATHETERIZATION  CARDIAC CATHETERIZATION 10/05/2020  Narrative  Prox LAD lesion is 30% stenosed.  1st Diag lesion is 55% stenosed.  Dist Cx lesion is 40% stenosed.  Prox RCA lesion is 25% stenosed.  Dist RCA lesion is 25% stenosed.  There is mild left ventricular systolic dysfunction.  LV end diastolic pressure is moderately elevated.  The left ventricular ejection fraction is 50-55% by visual estimate.  Hemodynamic findings consistent with mild pulmonary hypertension.  1. Nonobstructive CAD. Codominant circulation 2. Low normal LV systolic function. EF estimated at 50%. 3. Elevated LV filling pressures - PCWP 22 mm Hg, EDP 25 mm Hg 4. Mild pulmonary HTN with mean PAP 27 mm Hg 5. Elevated RA pressures 16 mm Hg 6. Reduced cardiac output with index 2.5  Plan: medical management for CAD. Need to repeat Echo to assess LV and RV function. May need to consider pulmonary embolic disease given pulmonary HTN and elevated RA pressures. This may reflect elevated Left heart pressures. Morbid obesity and OSA may also be contributing.  Findings Coronary Findings Diagnostic  Dominance: Co-dominant  Left Main Vessel was injected. Vessel is normal in caliber. Vessel is angiographically normal.  Left Anterior Descending Prox LAD lesion is 30% stenosed.  First Diagonal Branch 1st Diag lesion is 55% stenosed.  Left Circumflex Dist Cx lesion is 40% stenosed.  Right Coronary  Artery Prox RCA lesion is 25% stenosed. Dist RCA lesion is 25% stenosed.  Intervention  No interventions have been documented.   STRESS TESTS  MYOCARDIAL PERFUSION IMAGING 04/09/2019  Narrative  The left ventricular ejection fraction is normal (55-65%).  Nuclear stress EF: 56%.  There was no ST segment deviation noted during stress.  The study is normal.  This is a low risk study.   ECHOCARDIOGRAM  ECHOCARDIOGRAM COMPLETE 10/06/2020  Narrative ECHOCARDIOGRAM REPORT    Patient Name:   Katelyn [name] Date of Exam: 10/06/2020 Medical Rec #:  [id number]   Height:       62.0 in Accession #:    [id number]  Weight:       222.4 lb Date of Birth:  04-22-1956   BSA:          2.000 m Patient Age:    64 years    BP:           145/73 mmHg Patient Gender: F           HR:           77 bpm. Exam Location:  Inpatient  Procedure: 2D Echo, 3D Echo, Color Doppler, Cardiac Doppler and Strain Analysis  Indications:    Pulmonary hypertension 416.8 / I27.2  History:        Patient has prior history of Echocardiogram examinations, most recent 04/28/2019. CAD, Arrythmias:LBBB; Risk Factors:Sleep Apnea, Hypertension, Diabetes and Dyslipidemia. GERD.  Sonographer:    Leta Jungling RDCS Referring Phys: [phone] PETER M [place]  IMPRESSIONS   1. Left ventricular ejection fraction, by estimation, is 55 to 60%. The left ventricle has normal function. The left ventricle has no regional wall motion abnormalities. There is mild left ventricular hypertrophy. Left ventricular diastolic parameters are indeterminate. 2. Right ventricular systolic function is normal. The right ventricular size is normal. Tricuspid regurgitation signal is inadequate for assessing PA pressure. 3. The mitral valve is normal in structure. No evidence of mitral valve regurgitation. No evidence of mitral stenosis. 4. The aortic valve is tricuspid. Aortic valve regurgitation is not visualized. No aortic stenosis is  present.  FINDINGS Left Ventricle: Left ventricular ejection fraction, by estimation, is 55 to 60%. The left ventricle has normal function. The left ventricle has no regional wall motion abnormalities. The left ventricular internal cavity size was normal in size. There is mild left ventricular hypertrophy. Left ventricular diastolic parameters are indeterminate.  Right Ventricle: The right ventricular size is normal. No increase in right ventricular wall thickness. Right ventricular systolic function is normal. Tricuspid regurgitation signal is inadequate for assessing PA pressure. The tricuspid regurgitant velocity is 1.34 m/s, and with an assumed right atrial pressure of 3 mmHg, the estimated right ventricular systolic pressure is 10.2 mmHg.  Left Atrium: Left atrial size was normal in size.  Right Atrium: Right atrial size was normal in size.  Pericardium: There is no evidence of pericardial effusion.  Mitral Valve: The mitral valve is normal in structure. No evidence of mitral valve regurgitation. No evidence of mitral valve stenosis.  Tricuspid Valve: The tricuspid valve is normal in structure. Tricuspid valve regurgitation is trivial.  Aortic Valve: The aortic valve is tricuspid. Aortic valve regurgitation is  Dominance: Co-dominant  Left Main Vessel was injected. Vessel is normal in caliber. Vessel is angiographically normal.  Left Anterior Descending Prox LAD lesion is 30% stenosed.  First Diagonal Branch 1st Diag lesion is 55% stenosed.  Left Circumflex Dist Cx lesion is 40% stenosed.  Right Coronary  Artery Prox RCA lesion is 25% stenosed. Dist RCA lesion is 25% stenosed.  Intervention  No interventions have been documented.   STRESS TESTS  MYOCARDIAL PERFUSION IMAGING 04/09/2019  Narrative  The left ventricular ejection fraction is normal (55-65%).  Nuclear stress EF: 56%.  There was no ST segment deviation noted during stress.  The study is normal.  This is a low risk study.   ECHOCARDIOGRAM  ECHOCARDIOGRAM COMPLETE 10/06/2020  Narrative ECHOCARDIOGRAM REPORT    Patient Name:   Katelyn [name] Date of Exam: 10/06/2020 Medical Rec #:  [id number]   Height:       62.0 in Accession #:    [id number]  Weight:       222.4 lb Date of Birth:  04-22-1956   BSA:          2.000 m Patient Age:    64 years    BP:           145/73 mmHg Patient Gender: F           HR:           77 bpm. Exam Location:  Inpatient  Procedure: 2D Echo, 3D Echo, Color Doppler, Cardiac Doppler and Strain Analysis  Indications:    Pulmonary hypertension 416.8 / I27.2  History:        Patient has prior history of Echocardiogram examinations, most recent 04/28/2019. CAD, Arrythmias:LBBB; Risk Factors:Sleep Apnea, Hypertension, Diabetes and Dyslipidemia. GERD.  Sonographer:    Leta Jungling RDCS Referring Phys: [phone] PETER M [place]  IMPRESSIONS   1. Left ventricular ejection fraction, by estimation, is 55 to 60%. The left ventricle has normal function. The left ventricle has no regional wall motion abnormalities. There is mild left ventricular hypertrophy. Left ventricular diastolic parameters are indeterminate. 2. Right ventricular systolic function is normal. The right ventricular size is normal. Tricuspid regurgitation signal is inadequate for assessing PA pressure. 3. The mitral valve is normal in structure. No evidence of mitral valve regurgitation. No evidence of mitral stenosis. 4. The aortic valve is tricuspid. Aortic valve regurgitation is not visualized. No aortic stenosis is  present.  FINDINGS Left Ventricle: Left ventricular ejection fraction, by estimation, is 55 to 60%. The left ventricle has normal function. The left ventricle has no regional wall motion abnormalities. The left ventricular internal cavity size was normal in size. There is mild left ventricular hypertrophy. Left ventricular diastolic parameters are indeterminate.  Right Ventricle: The right ventricular size is normal. No increase in right ventricular wall thickness. Right ventricular systolic function is normal. Tricuspid regurgitation signal is inadequate for assessing PA pressure. The tricuspid regurgitant velocity is 1.34 m/s, and with an assumed right atrial pressure of 3 mmHg, the estimated right ventricular systolic pressure is 10.2 mmHg.  Left Atrium: Left atrial size was normal in size.  Right Atrium: Right atrial size was normal in size.  Pericardium: There is no evidence of pericardial effusion.  Mitral Valve: The mitral valve is normal in structure. No evidence of mitral valve regurgitation. No evidence of mitral valve stenosis.  Tricuspid Valve: The tricuspid valve is normal in structure. Tricuspid valve regurgitation is trivial.  Aortic Valve: The aortic valve is tricuspid. Aortic valve regurgitation is  Dominance: Co-dominant  Left Main Vessel was injected. Vessel is normal in caliber. Vessel is angiographically normal.  Left Anterior Descending Prox LAD lesion is 30% stenosed.  First Diagonal Branch 1st Diag lesion is 55% stenosed.  Left Circumflex Dist Cx lesion is 40% stenosed.  Right Coronary  Artery Prox RCA lesion is 25% stenosed. Dist RCA lesion is 25% stenosed.  Intervention  No interventions have been documented.   STRESS TESTS  MYOCARDIAL PERFUSION IMAGING 04/09/2019  Narrative  The left ventricular ejection fraction is normal (55-65%).  Nuclear stress EF: 56%.  There was no ST segment deviation noted during stress.  The study is normal.  This is a low risk study.   ECHOCARDIOGRAM  ECHOCARDIOGRAM COMPLETE 10/06/2020  Narrative ECHOCARDIOGRAM REPORT    Patient Name:   Katelyn [name] Date of Exam: 10/06/2020 Medical Rec #:  [id number]   Height:       62.0 in Accession #:    [id number]  Weight:       222.4 lb Date of Birth:  04-22-1956   BSA:          2.000 m Patient Age:    64 years    BP:           145/73 mmHg Patient Gender: F           HR:           77 bpm. Exam Location:  Inpatient  Procedure: 2D Echo, 3D Echo, Color Doppler, Cardiac Doppler and Strain Analysis  Indications:    Pulmonary hypertension 416.8 / I27.2  History:        Patient has prior history of Echocardiogram examinations, most recent 04/28/2019. CAD, Arrythmias:LBBB; Risk Factors:Sleep Apnea, Hypertension, Diabetes and Dyslipidemia. GERD.  Sonographer:    Leta Jungling RDCS Referring Phys: [phone] PETER M [place]  IMPRESSIONS   1. Left ventricular ejection fraction, by estimation, is 55 to 60%. The left ventricle has normal function. The left ventricle has no regional wall motion abnormalities. There is mild left ventricular hypertrophy. Left ventricular diastolic parameters are indeterminate. 2. Right ventricular systolic function is normal. The right ventricular size is normal. Tricuspid regurgitation signal is inadequate for assessing PA pressure. 3. The mitral valve is normal in structure. No evidence of mitral valve regurgitation. No evidence of mitral stenosis. 4. The aortic valve is tricuspid. Aortic valve regurgitation is not visualized. No aortic stenosis is  present.  FINDINGS Left Ventricle: Left ventricular ejection fraction, by estimation, is 55 to 60%. The left ventricle has normal function. The left ventricle has no regional wall motion abnormalities. The left ventricular internal cavity size was normal in size. There is mild left ventricular hypertrophy. Left ventricular diastolic parameters are indeterminate.  Right Ventricle: The right ventricular size is normal. No increase in right ventricular wall thickness. Right ventricular systolic function is normal. Tricuspid regurgitation signal is inadequate for assessing PA pressure. The tricuspid regurgitant velocity is 1.34 m/s, and with an assumed right atrial pressure of 3 mmHg, the estimated right ventricular systolic pressure is 10.2 mmHg.  Left Atrium: Left atrial size was normal in size.  Right Atrium: Right atrial size was normal in size.  Pericardium: There is no evidence of pericardial effusion.  Mitral Valve: The mitral valve is normal in structure. No evidence of mitral valve regurgitation. No evidence of mitral valve stenosis.  Tricuspid Valve: The tricuspid valve is normal in structure. Tricuspid valve regurgitation is trivial.  Aortic Valve: The aortic valve is tricuspid. Aortic valve regurgitation is

## 2023-07-10 ENCOUNTER — Encounter: Payer: Self-pay | Admitting: Cardiology

## 2023-07-10 ENCOUNTER — Ambulatory Visit: Payer: PPO | Attending: Cardiology | Admitting: Cardiology

## 2023-07-10 VITALS — BP 123/85 | HR 91 | Ht 61.0 in | Wt 197.0 lb

## 2023-07-10 DIAGNOSIS — E7800 Pure hypercholesterolemia, unspecified: Secondary | ICD-10-CM

## 2023-07-10 DIAGNOSIS — I5022 Chronic systolic (congestive) heart failure: Secondary | ICD-10-CM

## 2023-07-10 DIAGNOSIS — I10 Essential (primary) hypertension: Secondary | ICD-10-CM | POA: Diagnosis not present

## 2023-07-10 DIAGNOSIS — I2510 Atherosclerotic heart disease of native coronary artery without angina pectoris: Secondary | ICD-10-CM

## 2023-07-10 DIAGNOSIS — R0602 Shortness of breath: Secondary | ICD-10-CM

## 2023-07-10 DIAGNOSIS — N1832 Chronic kidney disease, stage 3b: Secondary | ICD-10-CM

## 2023-07-10 DIAGNOSIS — I5020 Unspecified systolic (congestive) heart failure: Secondary | ICD-10-CM

## 2023-07-10 NOTE — Patient Instructions (Addendum)
Medication Instructions:  Your physician recommends that you continue on your current medications as directed. Please refer to the Current Medication list given to you today.  *If you need a refill on your cardiac medications before your next appointment, please call your pharmacy*   Lab Work: None Ordered If you have labs (blood work) drawn today and your tests are completely normal, you will receive your results only by: MyChart Message (if you have MyChart) OR A paper copy in the mail If you have any lab test that is abnormal or we need to change your treatment, we will call you to review the results.   Testing/Procedures: None Ordered   Follow-Up: At [facility], you and your health needs are our priority.  As part of our continuing mission to provide you with exceptional heart care, we have created designated Provider Care Teams.  These Care Teams include your primary Cardiologist (physician) and Advanced Practice Providers (APPs -  Physician Assistants and Nurse Practitioners) who all work together to provide you with the care you need, when you need it.  We recommend signing up for the patient portal called "MyChart".  Sign up information is provided on this After Visit Summary.  MyChart is used to connect with patients for Virtual Visits (Telemedicine).  Patients are able to view lab/test results, encounter notes, upcoming appointments, etc.  Non-urgent messages can be sent to your provider as well.   To learn more about what you can do with MyChart, go to [email or web address].    Your next appointment:   6 month(s) follow up with Dr. Dulce Sellar  The format for your next appointment:   In Person  Provider:   Wallis Bamberg, NP   Other Instructions NA

## 2023-07-11 LAB — PRO B NATRIURETIC PEPTIDE: NT-Pro BNP: 145 pg/mL (ref 0–301)

## 2023-07-12 ENCOUNTER — Telehealth: Payer: Self-pay

## 2023-07-12 DIAGNOSIS — I10 Essential (primary) hypertension: Secondary | ICD-10-CM

## 2023-07-12 NOTE — Telephone Encounter (Signed)
-----   Message from Flossie Dibble sent at 07/11/2023 11:33 AM EDT ----- Lab value is ok, not holding on to too much fluid from the heart.  Legs continue to be swollen. She can take an extra dose of torsemide 20 mg PRN for pedal edema.   Repeat BMET in 2 weeks.

## 2023-07-12 NOTE — Telephone Encounter (Signed)
Results reviewed with pt as per Wallis Bamberg PA's note.  Pt verbalized understanding and had no additional questions. Routed to PCP.

## 2023-07-31 ENCOUNTER — Other Ambulatory Visit: Payer: Self-pay | Admitting: Pharmacy Technician

## 2023-08-01 ENCOUNTER — Telehealth: Payer: Self-pay

## 2023-08-01 NOTE — Telephone Encounter (Signed)
Dr. Dulce Sellar, patient will be scheduled as soon as possible  Auth Submission: APPROVED Site of care: Site of care: [facility] Payer: [employer] Medication & CPT/J Code(s) submitted: Leqvio (Inclisiran) J1306 Route of submission (phone, fax, portal): fax Phone # Fax # Auth type: Buy/Bill PB Units/visits requested: 284mg  x 2 doses Reference number: [id number] Approval dates 08/16/23 - 11/14/23

## 2023-08-06 ENCOUNTER — Ambulatory Visit (INDEPENDENT_AMBULATORY_CARE_PROVIDER_SITE_OTHER): Payer: PPO | Admitting: Family

## 2023-08-06 ENCOUNTER — Encounter: Payer: Self-pay | Admitting: Family

## 2023-08-06 ENCOUNTER — Other Ambulatory Visit: Payer: Self-pay

## 2023-08-06 VITALS — BP 103/67 | HR 84 | Temp 98.0°F | Ht 61.0 in | Wt 195.0 lb

## 2023-08-06 DIAGNOSIS — T85733D Infection and inflammatory reaction due to implanted electronic neurostimulator of spinal cord, electrode (lead), subsequent encounter: Secondary | ICD-10-CM

## 2023-08-06 DIAGNOSIS — M4645 Discitis, unspecified, thoracolumbar region: Secondary | ICD-10-CM | POA: Diagnosis not present

## 2023-08-06 DIAGNOSIS — G4733 Obstructive sleep apnea (adult) (pediatric): Secondary | ICD-10-CM

## 2023-08-06 DIAGNOSIS — G062 Extradural and subdural abscess, unspecified: Secondary | ICD-10-CM

## 2023-08-06 DIAGNOSIS — N1832 Chronic kidney disease, stage 3b: Secondary | ICD-10-CM

## 2023-08-06 MED ORDER — DOXYCYCLINE HYCLATE 100 MG PO TABS: 100.0000 mg | ORAL_TABLET | Freq: Two times a day (BID) | ORAL | 1 refills | Status: DC

## 2023-08-06 MED ORDER — CIPROFLOXACIN HCL 500 MG PO TABS: 500.0000 mg | ORAL_TABLET | Freq: Two times a day (BID) | ORAL | 1 refills | Status: DC

## 2023-08-06 NOTE — Progress Notes (Signed)
Subjective:    Patient ID: Katelyn [name], female    DOB: 1956-02-11, 67 y.o.   MRN: [id number]  Chief Complaint  Patient presents with   Follow-up   Osteomyelitis     HPI:  Katelyn [name] is a 67 y.o. female with prior postoperative infection with Serratia marcescens in 2017 and recently T11-12 discitis/osteomyelitis and epidural phlegmon from T8-T9 and L to-L3 last seen on 04/24/2023 with good adherence and tolerance to doxycycline and ciprofloxacin for suppression.  Here today for routine follow-up.  Katelyn [name] is present today with her husband and has been doing okay since her last office visit taking the ciprofloxacin and doxycycline as prescribed with no adverse side effects.  She has noticed she is more drowsy recently and she is recovering from a sinus infection and has questions about her immune system.  Awaiting further evaluation of liver cirrhosis by gastroenterology.  Ambulates with a walker and back is healing slowly but she notes being unsteady without the walker.   Allergies  Allergen Reactions   Atorvastatin Nausea And Vomiting and Other (See Comments)    MYALGIAS   Talwin [Pentazocine] Other (See Comments)    headache   Gabapentin Swelling   Other Nausea Only    UNSPECIFIED Anesthesia   Cefepime Other (See Comments)    Presumed AMS/neurotoxicity in the setting of AKI      Outpatient Medications Prior to Visit  Medication Sig Dispense Refill   carvedilol (COREG) 6.25 MG tablet Take 1 tablet (6.25 mg total) by mouth 2 (two) times daily. Take 6.25 mg by mouth 2 (two) times daily. / Needs appointment for further refills 60 tablet 0   dapagliflozin propanediol (FARXIGA) 5 MG TABS tablet Take 10 mg by mouth daily.     DULoxetine (CYMBALTA) 60 MG capsule Take 60 mg by mouth at bedtime.      insulin aspart protamine - aspart (NOVOLOG 70/30 MIX) (70-30) 100 UNIT/ML FlexPen Inject 10-12 Units into the skin 2 (two) times daily with a meal.     mirtazapine (REMERON) 15 MG tablet  Take 30 mg by mouth at bedtime.     MOUNJARO 5 MG/0.5ML Pen Inject 5 mg into the skin once a week.     Multiple Vitamins-Minerals (HAIR SKIN AND NAILS FORMULA) TABS Take 1 tablet by mouth daily.     nitroGLYCERIN (NITROSTAT) 0.4 MG SL tablet Place 0.4 mg under the tongue every 5 (five) minutes as needed for chest pain.     oxyCODONE (OXY IR/ROXICODONE) 5 MG immediate release tablet Take 0.5 tablets (2.5 mg total) by mouth every 6 (six) hours as needed for severe pain. 15 tablet 0   pantoprazole (PROTONIX) 40 MG tablet Take 1 tablet (40 mg total) by mouth daily. 90 tablet 2   pramipexole (MIRAPEX) 1 MG tablet Take 1 mg by mouth at bedtime.     pregabalin (LYRICA) 75 MG capsule Take 75 mg by mouth at bedtime.     torsemide (DEMADEX) 20 MG tablet Take 40 mg by mouth daily.     TRESIBA FLEXTOUCH 200 UNIT/ML FlexTouch Pen Inject 20 Units into the skin at bedtime. 3 mL 0   ciprofloxacin (CIPRO) 500 MG tablet Take 1 tablet (500 mg total) by mouth 2 (two) times daily. Start on 04/04/23 60 tablet 2   doxycycline (VIBRA-TABS) 100 MG tablet Take 1 tablet (100 mg total) by mouth 2 (two) times daily. Start taking 04/04/23 60 tablet 5   No facility-administered medications prior to visit.  Past Medical History:  Diagnosis Date   Anxiety    Arthritis    Bursitis of right hip    CAD (coronary artery disease)    Cardiac catheterization June 2014 in [place] - 50% circumflex stenosis   Chest pain, neg MI, stable CAD non obstructive on cath 10/05/20 10/04/2020   Chronic diastolic heart failure (HCC) 08/20/2017   Chronic kidney disease, stage 3 (HCC)    does not see nephrologist   Chronic low back pain without sciatica 03/14/2016   CKD (chronic kidney disease), stage III (HCC) 10/07/2020   Complication of anesthesia    Cough 04/19/2017   Overview:  Last Assessment & Plan:  Formatting of this note may be different from the original. Cough - ? ACE related with AR triggers   Plan  Patient Instructions   Discuss with your primary doctor that lisinopril pain, need making your cough worse. May use Mucinex DM twice daily as needed for cough and congestion Zyrtec 10 mg at bedtime as needed for drainage Saline nasal spray as needed. Lab tests today Activity as tolerated. Follow with Dr. Craige Cotta in 3-4 months and As needed   Please contact office for sooner follow up if symptoms do not improve or worsen or seek emergency care    Depression    Dyspnea    with exertion   " lazy lung" - per  Dr Craige Cotta from back issues- 06/2016   Elevated liver enzymes 12/05/2016   Essential hypertension    GERD (gastroesophageal reflux disease)    Gout 03/14/2016   Greater trochanteric bursitis of right hip 02/02/2012   H/O hiatal hernia    Heart murmur    History of blood transfusion 2016   History of esophageal stricture 10/07/2020   History of kidney stones    Hypercholesterolemia    Hypertensive heart disease with heart failure (HCC) 01/01/2017   Hypoxia 10/07/2020   Iliotibial band syndrome of right side 02/02/2012   Iron deficiency anemia due to chronic blood loss 03/14/2016   LBBB (left bundle branch block) 01/01/2017   Left bundle branch block    Leg weakness 10/07/2020   Lumbar stenosis    Meralgia paraesthetica 12/05/2016   Mild CAD 11/24/2015   Morbid obesity (HCC) 10/07/2020   Neuropathy    OSA (obstructive sleep apnea) 05/24/2016   Overview:  Managed PULM   PONV (postoperative nausea and vomiting)    "no N/V with patch"   Restless leg syndrome    S/P lumbar laminectomy 11/26/2015   S/P lumbar spinal fusion 08/29/2016   Tinnitus 12/05/2016   Type 2 diabetes mellitus (HCC)    UTI (urinary tract infection) 10/07/2020     Past Surgical History:  Procedure Laterality Date   ABDOMINAL HYSTERECTOMY  1983   APPENDECTOMY  Age 28   BACK SURGERY     FIRST LUMBAR FUSION/ SURGERY APRIL 2012 AND FUSION WITH INSTRUMENTATION SEPT 2012   CARDIAC CATHETERIZATION     x 2   CARPAL TUNNEL RELEASE     bil    CHOLECYSTECTOMY  1990's   COLONOSCOPY  12/12/2017   Colonic polyp status post polypectomy. Mild sigmoid diverticulosis. Otherwise normal colonoscopy to terminal ileum.   CYSTO EXTRACTION KIDNEY STONES     ESOPHAGOGASTRODUODENOSCOPY  12/12/2017   Small hiatal hernia. Mild gastritis. Status post esophageal dilatation.   EXCISION/RELEASE BURSA HIP  02/02/2012   Procedure: EXCISION/RELEASE BURSA HIP;  Surgeon: Jacki Cones, MD;  Location: [facility];  Service: Orthopedics;  Laterality: Right;  Right  Hip Bursectomy   EYE SURGERY Bilateral    cataracts   IR THORACIC DISC ASPIRATION W/IMG GUIDE  02/14/2023   KIDNEY STONE SURGERY  2008   LAMINECTOMY WITH POSTERIOR LATERAL ARTHRODESIS LEVEL 4 N/A 02/19/2023   Procedure: THORACIC TEN-LUMBAR TWO INSTRUMENTED FUSION;  Surgeon: Tia Alert, MD;  Location: [facility] OR;  Service: Neurosurgery;  Laterality: N/A;   Left knee surgery x 2  1996   reconstruction   LUMBAR DISC SURGERY  08/2016   LUMBAR LAMINECTOMY/DECOMPRESSION MICRODISCECTOMY Right 11/26/2015   Procedure: Extraforaminal Microdiscectomy  - Lumbar two-three- right;  Surgeon: Tia Alert, MD;  Location: [facility];  Service: Neurosurgery;  Laterality: Right;  right    LUMBAR WOUND DEBRIDEMENT N/A 10/25/2016   Procedure: Lumbar wound revision;  Surgeon: Tia Alert, MD;  Location[facility] OR;  Service: Neurosurgery;  Laterality: N/A;  Lumbar wound revision   Right shoulder surgery  2010   spur   RIGHT/LEFT HEART CATH AND CORONARY ANGIOGRAPHY N/A 10/05/2020   Procedure: RIGHT/LEFT HEART CATH AND CORONARY ANGIOGRAPHY;  Surgeon: Swazil[place], Peter M, MD;  Location: [facility] INVASIVE CV LAB;  Service: Cardiovascular;  Laterality: N/A;   SPINAL CORD STIMULATOR REMOVAL  02/19/2023   Procedure: LUMBAR SPINAL CORD STIMULATOR REMOVAL;  Surgeon: Tia Alert, MD;  Location: [facility] OR;  Service: Neurosurgery;;       Review of Systems  Constitutional:  Positive for fatigue. Negative for chills, diaphoresis and fever.   Respiratory:  Negative for cough, chest tightness, shortness of breath and wheezing.   Cardiovascular:  Negative for chest pain.  Gastrointestinal:  Negative for abdominal pain, diarrhea, nausea and vomiting.  Musculoskeletal:  Positive for back pain.      Objective:    BP 103/67   Pulse 84   Temp 98 F (36.7 C) (Temporal)   Ht 5\' 1"  (1.549 m)   Wt 195 lb (88.5 kg)   SpO2 96%   BMI 36.84 kg/m  Nursing note and vital signs reviewed.  Physical Exam Constitutional:      General: She is not in acute distress.    Appearance: She is well-developed.  Cardiovascular:     Rate and Rhythm: Normal rate and regular rhythm.     Heart sounds: Normal heart sounds.  Pulmonary:     Effort: Pulmonary effort is normal.     Breath sounds: Normal breath sounds.  Skin:    General: Skin is warm and dry.  Neurological:     Mental Status: She is alert.         02/08/2023    1:58 PM 12/26/2022    3:43 PM  Depression screen PHQ 2/9  Decreased Interest 0 0  Down, Depressed, Hopeless 0 0  PHQ - 2 Score 0 0       Assessment & Plan:    Patient Active Problem List   Diagnosis Date Noted   ACS (acute coronary syndrome) (HCC) 04/06/2023   Acute sciatica 04/06/2023   Dyslipidemia 04/06/2023   Hypertensive heart and chronic kidney disease 04/06/2023   Hyponatremia 04/06/2023   Hypothermia 04/06/2023   Renal insufficiency 04/06/2023   SIRS (systemic inflammatory response syndrome) (HCC) 04/06/2023   Slurred speech 04/06/2023   Thrombocytopenia (HCC) 04/06/2023   Ventricular tachycardia, nonsustained (HCC) 04/06/2023   PICC (peripherally inserted central catheter) in place 03/28/2023   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   Acute renal failure (ARF) (HCC) 03/13/2023   Elevated LFTs 03/13/2023   AKI (acute kidney injury) (HCC) 02/20/2023   Status post thoracic  spinal fusion 02/19/2023   Discitis 02/13/2023   Malfunction of spinal cord stimulator (HCC) 02/13/2023   Serratia 02/13/2023    Diabetes mellitus without complication (HCC) 02/13/2023   Osteomyelitis (HCC) 02/05/2023   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   Infection of spinal cord stimulator (HCC) 12/07/2022   Lactic acidosis 12/06/2022   Hypomagnesemia 12/06/2022   Anemia of chronic disease 12/06/2022   Diskitis 12/05/2022   Status post insertion of spinal cord stimulator 11/22/2022   Acute thoracic back pain 06/22/2022   Other long term (current) drug therapy 06/22/2022   Lumbar spondylosis 06/22/2022   Thoracic spondylosis 06/22/2022   Bilateral sacroiliitis (HCC) 06/22/2022   Chronic, continuous use of opioids 05/31/2022   Pain medication agreement 05/02/2022   Postlaminectomy syndrome of lumbar region 05/02/2022   Radiculopathy of thoracolumbar region 05/02/2022   Class 2 severe obesity due to excess calories with serious comorbidity and body mass index (BMI) of 37.0 to 37.9 in adult [facility]) 05/02/2022   Opioid use agreement exists 05/02/2022   Mastodynia 04/13/2022   Peripheral artery disease (HCC) 07/11/2021   COVID-19 virus infection 06/21/2021   Rib pain on left side 06/06/2021   Abdominal pain 06/05/2021   Diabetes 1.5, 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   GAD (generalized anxiety disorder) 11/25/2020   Anxiety and depression 11/03/2020   DM2 (diabetes mellitus, type 2) (HCC)    Restless leg syndrome    PONV (postoperative nausea and vomiting)    Neuropathy    Lumbar stenosis    Left bundle branch block    Hypercholesterolemia    Heart murmur    History of kidney stones    H/O hiatal hernia    Essential hypertension    Dyspnea    Chronic depression    Complication of anesthesia    CKD stage 3b, GFR 30-44 ml/min (HCC)    CAD (coronary artery disease)    Bursitis of right hip    Arthritis    Anxiety    Morbid obesity (HCC) 10/07/2020   Hypoxia 10/07/2020   History of esophageal stricture 10/07/2020   CKD (chronic kidney disease), stage III (HCC)  10/07/2020   Leg weakness 10/07/2020   UTI (urinary tract infection) 10/07/2020   Chronic hypoxemic respiratory failure (HCC) 10/07/2020   Pulmonary hypertension (HCC) 10/06/2020   Chest pain, neg MI, stable CAD non obstructive on cath 10/05/20 10/04/2020   Hardening of the aorta (main artery of the heart) (HCC) 11/25/2019   Hypertensive heart disease with congestive heart failure and chronic kidney disease (HCC) 11/25/2019   Shortness of breath 06/11/2019   Fatty liver 04/08/2019   Migraine 04/08/2019   History of chronic kidney disease 04/08/2019   Acute non-recurrent sinusitis 10/12/2017   Chronic diastolic CHF (congestive heart failure) (HCC) 08/20/2017   Genetic testing 08/17/2017   Family history of ovarian cancer 08/17/2017   Family history of breast cancer 08/17/2017   Cough 04/19/2017   DOE (dyspnea on exertion) 04/19/2017   Type 2 diabetes mellitus with diabetic neuropathy, unspecified (HCC) 01/16/2017   Hypertensive heart disease with heart failure (HCC) 01/01/2017   Hyperlipidemia 01/01/2017   LBBB (left bundle branch block) 01/01/2017   Elevated liver enzymes 12/05/2016   Meralgia paraesthetica 12/05/2016   Tinnitus 12/05/2016   Spinal stenosis of lumbar region with neurogenic claudication 12/05/2016   Wound dehiscence 10/23/2016   S/P lumbar spinal fusion 08/29/2016   OSA (obstructive sleep apnea) 05/24/2016   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   Chronic  low back pain without sciatica 03/14/2016   GERD (gastroesophageal reflux disease) 03/14/2016   Gout 03/14/2016   Iron deficiency anemia due to chronic blood loss 03/14/2016   Type 2 diabetes mellitus without complication, with long-term current use of insulin (HCC) 03/14/2016   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HCC) 12/07/2015   Restless leg 12/07/2015   Insomnia 12/07/2015   S/P lumbar laminectomy 11/26/2015   Postprocedural state 11/26/2015   Mild CAD 11/24/2015   Lumbar radiculopathy 10/15/2015    History of blood transfusion 2016   Kidney stone 04/20/2014   Stricture of esophagus 04/20/2014   Greater trochanteric bursitis of right hip 02/02/2012   Iliotibial band syndrome of right side 02/02/2012   Iliotibial band syndrome 02/02/2012   Bursitis, trochanteric 02/02/2012     Problem List Items Addressed This Visit       Respiratory   OSA (obstructive sleep apnea)    Previously diagnosed with obstructive sleep apnea not currently on treatments having increased daytime fatigue and sleepiness and not sleeping well at night.  Encouraged to follow-up with pulmonology as she was last seen in April with recommendation for split-night sleep study.        Nervous and Auditory   Epidural abscess   Relevant Orders   COMPLETE METABOLIC PANEL WITH GFR   CBC with Differential/Platelet   Sedimentation rate   C-reactive protein     Musculoskeletal and Integument   Discitis - Primary    Ms. Engert continues to have good adherence and tolerance to doxycycline and ciprofloxacin with occasional nausea when she does not eat prior to taking medication.  Back pain is improving and able to ambulate with walker.  Check inflammatory markers and CBC.  Continue current dose of ciprofloxacin and doxycycline.  Plan for follow-up in 3 months or sooner if needed.      Relevant Medications   ciprofloxacin (CIPRO) 500 MG tablet   doxycycline (VIBRA-TABS) 100 MG tablet   Other Relevant Orders   COMPLETE METABOLIC PANEL WITH GFR   CBC with Differential/Platelet   Sedimentation rate   C-reactive protein     Genitourinary   CKD stage 3b, GFR 30-44 ml/min (HCC)    Renal function is stable and CrCl calculated at 38. Safe to continue current dose of ciprofloxacin 500 mg bid at this time and will continue therapeutic monitoring and dose adjustment as appropriate.         Other   Infection of spinal cord stimulator (HCC)   Relevant Medications   ciprofloxacin (CIPRO) 500 MG tablet   doxycycline  (VIBRA-TABS) 100 MG tablet     I have changed Katelyn [name]'s ciprofloxacin. I am also having her maintain her DULoxetine, mirtazapine, nitroGLYCERIN, pramipexole, carvedilol, Hair Skin and Nails Formula, pregabalin, [name] FlexTouch, insulin aspart protamine - aspart, oxyCODONE, dapagliflozin propanediol, pantoprazole, Mounjaro, torsemide, and doxycycline.   Meds ordered this encounter  Medications   ciprofloxacin (CIPRO) 500 MG tablet    Sig: Take 1 tablet (500 mg total) by mouth 2 (two) times daily.    Dispense:  180 tablet    Refill:  1    Order Specific Question:   Supervising Provider    Answer:   Drue Second, CYNTHIA [[id number]]   doxycycline (VIBRA-TABS) 100 MG tablet    Sig: Take 1 tablet (100 mg total) by mouth 2 (two) times daily. Start taking 04/04/23    Dispense:  180 tablet    Refill:  1    Order Specific Question:   Supervising Provider  Answer:   Judyann Munson [[id number]]     Follow-up: Return in about 3 months (around 11/06/2023), or if symptoms worsen or fail to improve. or sooner if needed.   Marcos Eke, MSN, FNP-C Nurse Practitioner [facility] Main number: [phone]

## 2023-08-06 NOTE — Assessment & Plan Note (Signed)
Previously diagnosed with obstructive sleep apnea not currently on treatments having increased daytime fatigue and sleepiness and not sleeping well at night.  Encouraged to follow-up with pulmonology as she was last seen in April with recommendation for split-night sleep study.

## 2023-08-06 NOTE — Patient Instructions (Addendum)
Nice to see you.  We will check your lab work today.  Continue to take your medication daily as prescribed.  Plan for follow up in 3 months or sooner if needed with lab work on the same day.  Have a great day and stay safe!  

## 2023-08-06 NOTE — Assessment & Plan Note (Signed)
Renal function is stable and CrCl calculated at 38. Safe to continue current dose of ciprofloxacin 500 mg bid at this time and will continue therapeutic monitoring and dose adjustment as appropriate.

## 2023-08-06 NOTE — Assessment & Plan Note (Signed)
Ms. Larocque continues to have good adherence and tolerance to doxycycline and ciprofloxacin with occasional nausea when she does not eat prior to taking medication.  Back pain is improving and able to ambulate with walker.  Check inflammatory markers and CBC.  Continue current dose of ciprofloxacin and doxycycline.  Plan for follow-up in 3 months or sooner if needed.

## 2023-08-08 LAB — CBC WITH DIFFERENTIAL/PLATELET
Absolute Lymphocytes: 1219 {cells}/uL (ref 850–3900)
Absolute Monocytes: 556 {cells}/uL (ref 200–950)
Basophils Absolute: 40 {cells}/uL (ref 0–200)
Basophils Relative: 0.6 %
Eosinophils Absolute: 188 {cells}/uL (ref 15–500)
Eosinophils Relative: 2.8 %
HCT: 31.6 % — ABNORMAL LOW (ref 35.0–45.0)
Hemoglobin: 10.5 g/dL — ABNORMAL LOW (ref 11.7–15.5)
MCH: 30.1 pg (ref 27.0–33.0)
MCHC: 33.2 g/dL (ref 32.0–36.0)
MCV: 90.5 fL (ref 80.0–100.0)
MPV: 9.7 fL (ref 7.5–12.5)
Monocytes Relative: 8.3 %
Neutro Abs: 4697 {cells}/uL (ref 1500–7800)
Neutrophils Relative %: 70.1 %
Platelets: 160 10*3/uL (ref 140–400)
RBC: 3.49 10*6/uL — ABNORMAL LOW (ref 3.80–5.10)
RDW: 14.4 % (ref 11.0–15.0)
Total Lymphocyte: 18.2 %
WBC: 6.7 10*3/uL (ref 3.8–10.8)

## 2023-08-08 LAB — COMPLETE METABOLIC PANEL WITH GFR
AG Ratio: 1 (calc) (ref 1.0–2.5)
ALT: 29 U/L (ref 6–29)
AST: 44 U/L — ABNORMAL HIGH (ref 10–35)
Albumin: 3.7 g/dL (ref 3.6–5.1)
Alkaline phosphatase (APISO): 200 U/L — ABNORMAL HIGH (ref 37–153)
BUN/Creatinine Ratio: 26 (calc) — ABNORMAL HIGH (ref 6–22)
BUN: 38 mg/dL — ABNORMAL HIGH (ref 7–25)
CO2: 33 mmol/L — ABNORMAL HIGH (ref 20–32)
Calcium: 9.4 mg/dL (ref 8.6–10.4)
Chloride: 98 mmol/L (ref 98–110)
Creat: 1.46 mg/dL — ABNORMAL HIGH (ref 0.50–1.05)
Globulin: 3.7 g/dL (ref 1.9–3.7)
Glucose, Bld: 85 mg/dL (ref 65–99)
Potassium: 3.7 mmol/L (ref 3.5–5.3)
Sodium: 141 mmol/L (ref 135–146)
Total Bilirubin: 0.5 mg/dL (ref 0.2–1.2)
Total Protein: 7.4 g/dL (ref 6.1–8.1)
eGFR: 39 mL/min/{1.73_m2} — ABNORMAL LOW (ref >=60)

## 2023-08-08 LAB — SEDIMENTATION RATE: Sed Rate: 72 mm/h — ABNORMAL HIGH (ref 0–30)

## 2023-08-08 LAB — C-REACTIVE PROTEIN: CRP: 4.4 mg/L (ref <8.0)

## 2023-08-31 ENCOUNTER — Ambulatory Visit: Payer: PPO

## 2023-08-31 VITALS — BP 134/68 | HR 90 | Temp 97.6°F | Resp 16 | Ht 61.0 in | Wt 200.6 lb

## 2023-08-31 DIAGNOSIS — E7800 Pure hypercholesterolemia, unspecified: Secondary | ICD-10-CM | POA: Diagnosis not present

## 2023-08-31 MED ORDER — INCLISIRAN SODIUM 284 MG/1.5ML ~~LOC~~ SOSY
284.0000 mg | PREFILLED_SYRINGE | Freq: Once | SUBCUTANEOUS | Status: AC
Start: 2023-08-31 — End: 2023-08-31
  Administered 2023-08-31: 284 mg via SUBCUTANEOUS
  Filled 2023-08-31: qty 1.5

## 2023-08-31 NOTE — Progress Notes (Signed)
Diagnosis: Hyperlipidemia  Provider:  Chilton Greathouse MD  Procedure: Injection  Leqvio (inclisiran), Dose: 284 mg, Site: subcutaneous, Number of injections: 1  Post Care:  right arm injection  Discharge: Condition: Good, Destination: Home . AVS Provided  Performed by:  Rico Ala, LPN

## 2023-09-14 ENCOUNTER — Encounter: Payer: Self-pay | Admitting: Pharmacist Clinician (PhC)/ Clinical Pharmacy Specialist

## 2023-09-14 DIAGNOSIS — E7800 Pure hypercholesterolemia, unspecified: Secondary | ICD-10-CM

## 2023-10-15 ENCOUNTER — Other Ambulatory Visit: Payer: Self-pay

## 2023-11-06 ENCOUNTER — Ambulatory Visit: Payer: PPO | Admitting: Family

## 2023-11-06 ENCOUNTER — Other Ambulatory Visit: Payer: Self-pay

## 2023-11-06 ENCOUNTER — Encounter: Payer: Self-pay | Admitting: Family

## 2023-11-06 VITALS — BP 144/81 | HR 97 | Temp 97.9°F | Wt 194.0 lb

## 2023-11-06 DIAGNOSIS — M4645 Discitis, unspecified, thoracolumbar region: Secondary | ICD-10-CM | POA: Diagnosis not present

## 2023-11-06 DIAGNOSIS — N1832 Chronic kidney disease, stage 3b: Secondary | ICD-10-CM

## 2023-11-06 DIAGNOSIS — A498 Other bacterial infections of unspecified site: Secondary | ICD-10-CM

## 2023-11-06 NOTE — Progress Notes (Unsigned)
Subjective:    Patient ID: Katelyn [name], female    DOB: 06-15-1956, 68 y.o.   MRN: [id number]  No chief complaint on file.   HPI:  Katelyn [name] is a 68 y.o. female with prior postoperative infection with Serratia marcescens in 2017 and recently T11/T12 discitis/osteomyelitis with epidural phlegmon from T8-T9 and L1-L3 last seen on 08/06/2023 with good adherence and tolerance to doxycycline and ciprofloxacin for suppression of infection.  Here today for routine follow-up.     Allergies  Allergen Reactions   Atorvastatin Nausea And Vomiting and Other (See Comments)    MYALGIAS   Talwin [Pentazocine] Other (See Comments)    headache   Gabapentin Swelling   Other Nausea Only    UNSPECIFIED Anesthesia   Cefepime Other (See Comments)    Presumed AMS/neurotoxicity in the setting of AKI      Outpatient Medications Prior to Visit  Medication Sig Dispense Refill   carvedilol (COREG) 6.25 MG tablet Take 1 tablet (6.25 mg total) by mouth 2 (two) times daily. Take 6.25 mg by mouth 2 (two) times daily. / Needs appointment for further refills 60 tablet 0   ciprofloxacin (CIPRO) 500 MG tablet Take 1 tablet (500 mg total) by mouth 2 (two) times daily. 180 tablet 1   dapagliflozin propanediol (FARXIGA) 5 MG TABS tablet Take 10 mg by mouth daily.     doxycycline (VIBRA-TABS) 100 MG tablet Take 1 tablet (100 mg total) by mouth 2 (two) times daily. Start taking 04/04/23 180 tablet 1   DULoxetine (CYMBALTA) 60 MG capsule Take 60 mg by mouth at bedtime.      insulin aspart protamine - aspart (NOVOLOG 70/30 MIX) (70-30) 100 UNIT/ML FlexPen Inject 10-12 Units into the skin 2 (two) times daily with a meal.     mirtazapine (REMERON) 15 MG tablet Take 30 mg by mouth at bedtime.     MOUNJARO 5 MG/0.5ML Pen Inject 5 mg into the skin once a week.     Multiple Vitamins-Minerals (HAIR SKIN AND NAILS FORMULA) TABS Take 1 tablet by mouth daily.     nitroGLYCERIN (NITROSTAT) 0.4 MG SL tablet Place 0.4 mg under  the tongue every 5 (five) minutes as needed for chest pain.     oxyCODONE (OXY IR/ROXICODONE) 5 MG immediate release tablet Take 0.5 tablets (2.5 mg total) by mouth every 6 (six) hours as needed for severe pain. 15 tablet 0   pantoprazole (PROTONIX) 40 MG tablet Take 1 tablet (40 mg total) by mouth daily. 90 tablet 2   pramipexole (MIRAPEX) 1 MG tablet Take 1 mg by mouth at bedtime.     pregabalin (LYRICA) 75 MG capsule Take 75 mg by mouth at bedtime.     torsemide (DEMADEX) 20 MG tablet Take 40 mg by mouth daily.     TRESIBA FLEXTOUCH 200 UNIT/ML FlexTouch Pen Inject 20 Units into the skin at bedtime. 3 mL 0   No facility-administered medications prior to visit.     Past Medical History:  Diagnosis Date   Anxiety    Arthritis    Bursitis of right hip    CAD (coronary artery disease)    Cardiac catheterization June 2014 in [place] - 50% circumflex stenosis   Chest pain, neg MI, stable CAD non obstructive on cath 10/05/20 10/04/2020   Chronic diastolic heart failure (HCC) 08/20/2017   Chronic kidney disease, stage 3 (HCC)    does not see nephrologist   Chronic low back pain without sciatica 03/14/2016  CKD (chronic kidney disease), stage III (HCC) 10/07/2020   Complication of anesthesia    Cough 04/19/2017   Overview:  Last Assessment & Plan:  Formatting of this note may be different from the original. Cough - ? ACE related with AR triggers   Plan  Patient Instructions  Discuss with your primary doctor that lisinopril pain, need making your cough worse. May use Mucinex DM twice daily as needed for cough and congestion Zyrtec 10 mg at bedtime as needed for drainage Saline nasal spray as needed. Lab tests today Activity as tolerated. Follow with Dr. Craige Cotta in 3-4 months and As needed   Please contact office for sooner follow up if symptoms do not improve or worsen or seek emergency care    Depression    Dyspnea    with exertion   " lazy lung" - per  Dr Craige Cotta from back issues- 06/2016    Elevated liver enzymes 12/05/2016   Essential hypertension    GERD (gastroesophageal reflux disease)    Gout 03/14/2016   Greater trochanteric bursitis of right hip 02/02/2012   H/O hiatal hernia    Heart murmur    History of blood transfusion 2016   History of esophageal stricture 10/07/2020   History of kidney stones    Hypercholesterolemia    Hypertensive heart disease with heart failure (HCC) 01/01/2017   Hypoxia 10/07/2020   Iliotibial band syndrome of right side 02/02/2012   Iron deficiency anemia due to chronic blood loss 03/14/2016   LBBB (left bundle branch block) 01/01/2017   Left bundle branch block    Leg weakness 10/07/2020   Lumbar stenosis    Meralgia paraesthetica 12/05/2016   Mild CAD 11/24/2015   Morbid obesity (HCC) 10/07/2020   Neuropathy    OSA (obstructive sleep apnea) 05/24/2016   Overview:  Managed PULM   PONV (postoperative nausea and vomiting)    "no N/V with patch"   Restless leg syndrome    S/P lumbar laminectomy 11/26/2015   S/P lumbar spinal fusion 08/29/2016   Tinnitus 12/05/2016   Type 2 diabetes mellitus (HCC)    UTI (urinary tract infection) 10/07/2020     Past Surgical History:  Procedure Laterality Date   ABDOMINAL HYSTERECTOMY  1983   APPENDECTOMY  Age 41   BACK SURGERY     FIRST LUMBAR FUSION/ SURGERY APRIL 2012 AND FUSION WITH INSTRUMENTATION SEPT 2012   CARDIAC CATHETERIZATION     x 2   CARPAL TUNNEL RELEASE     bil   CHOLECYSTECTOMY  1990's   COLONOSCOPY  12/12/2017   Colonic polyp status post polypectomy. Mild sigmoid diverticulosis. Otherwise normal colonoscopy to terminal ileum.   CYSTO EXTRACTION KIDNEY STONES     ESOPHAGOGASTRODUODENOSCOPY  12/12/2017   Small hiatal hernia. Mild gastritis. Status post esophageal dilatation.   EXCISION/RELEASE BURSA HIP  02/02/2012   Procedure: EXCISION/RELEASE BURSA HIP;  Surgeon: Jacki Cones, MD;  Location: [facility];  Service: Orthopedics;  Laterality: Right;  Right Hip Bursectomy   EYE SURGERY  Bilateral    cataracts   IR THORACIC DISC ASPIRATION W/IMG GUIDE  02/14/2023   KIDNEY STONE SURGERY  2008   LAMINECTOMY WITH POSTERIOR LATERAL ARTHRODESIS LEVEL 4 N/A 02/19/2023   Procedure: THORACIC TEN-LUMBAR TWO INSTRUMENTED FUSION;  Surgeon: Tia Alert, MD;  Location: [facility] OR;  Service: Neurosurgery;  Laterality: N/A;   Left knee surgery x 2  1996   reconstruction   LUMBAR DISC SURGERY  08/2016   LUMBAR LAMINECTOMY/DECOMPRESSION MICRODISCECTOMY Right 11/26/2015  Procedure: Extraforaminal Microdiscectomy  - Lumbar two-three- right;  Surgeon: Tia Alert, MD;  Location: [facility] OR[facility];  Service: Neurosurgery;  Laterality: Right;  right    LUMBAR WOUND DEBRIDEMENT N/A 10/25/2016   Procedure: Lumbar wound revision;  Surgeon: Tia Alert, MD;  Location: [facility] OR;  Service: Neurosurgery;  Laterality: N/A;  Lumbar wound revision   Right shoulder surgery  2010   spur   RIGHT/LEFT HEART CATH AND CORONARY ANGIOGRAPHY N/A 10/05/2020   Procedure: RIGHT/LEFT HEART CATH AND CORONARY ANGIOGRAPHY;  Surgeon: Swaziland, Peter M, MD;  Location: [facility] INVASIVE CV LAB;  Service: Cardiovascular;  Laterality: N/A;   SPINAL CORD STIMULATOR REMOVAL  02/19/2023   Procedure: LUMBAR SPINAL CORD STIMULATOR REMOVAL;  Surgeon: Tia Alert, MD;  Location: [facility] OR;  Service: Neurosurgery;;       Review of Systems    Objective:    Wt 194 lb (88 kg)   BMI 36.66 kg/m  Nursing note and vital signs reviewed.  Physical Exam      02/08/2023    1:58 PM 12/26/2022    3:43 PM  Depression screen PHQ 2/9  Decreased Interest 0 0  Down, Depressed, Hopeless 0 0  PHQ - 2 Score 0 0       Assessment & Plan:    Patient Active Problem List   Diagnosis Date Noted   ACS (acute coronary syndrome) (HCC) 04/06/2023   Acute sciatica 04/06/2023   Dyslipidemia 04/06/2023   Hypertensive heart and chronic kidney disease 04/06/2023   Hyponatremia 04/06/2023   Hypothermia 04/06/2023   Renal insufficiency 04/06/2023   SIRS  (systemic inflammatory response syndrome) (HCC) 04/06/2023   Slurred speech 04/06/2023   Thrombocytopenia (HCC) 04/06/2023   Ventricular tachycardia, nonsustained (HCC) 04/06/2023   PICC (peripherally inserted central catheter) in place 03/28/2023   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   Acute renal failure (ARF) (HCC) 03/13/2023   Elevated LFTs 03/13/2023   AKI (acute kidney injury) (HCC) 02/20/2023   Status post thoracic spinal fusion 02/19/2023   Discitis 02/13/2023   Malfunction of spinal cord stimulator (HCC) 02/13/2023   Serratia 02/13/2023   Diabetes mellitus without complication (HCC) 02/13/2023   Osteomyelitis (HCC) 02/05/2023   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   Infection of spinal cord stimulator (HCC) 12/07/2022   Lactic acidosis 12/06/2022   Hypomagnesemia 12/06/2022   Anemia of chronic disease 12/06/2022   Diskitis 12/05/2022   Status post insertion of spinal cord stimulator 11/22/2022   Acute thoracic back pain 06/22/2022   Other long term (current) drug therapy 06/22/2022   Lumbar spondylosis 06/22/2022   Thoracic spondylosis 06/22/2022   Bilateral sacroiliitis (HCC) 06/22/2022   Chronic, continuous use of opioids 05/31/2022   Pain medication agreement 05/02/2022   Postlaminectomy syndrome of lumbar region 05/02/2022   Radiculopathy of thoracolumbar region 05/02/2022   Class 2 severe obesity due to excess calories with serious comorbidity and body mass index (BMI) of 37.0 to 37.9 in adult [facility]) 05/02/2022   Opioid use agreement exists 05/02/2022   Mastodynia 04/13/2022   Peripheral artery disease (HCC) 07/11/2021   COVID-19 virus infection 06/21/2021   Rib pain on left side 06/06/2021   Abdominal pain 06/05/2021   Diabetes 1.5, 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   GAD (generalized anxiety disorder) 11/25/2020   Anxiety and depression 11/03/2020   DM2 (diabetes mellitus, type 2) (HCC)    Restless leg syndrome    PONV  (postoperative nausea and vomiting)    Neuropathy  Lumbar stenosis    Left bundle branch block    Hypercholesterolemia    Heart murmur    History of kidney stones    H/O hiatal hernia    Essential hypertension    Dyspnea    Chronic depression    Complication of anesthesia    CKD stage 3b, GFR 30-44 ml/min (HCC)    CAD (coronary artery disease)    Bursitis of right hip    Arthritis    Anxiety    Morbid obesity (HCC) 10/07/2020   Hypoxia 10/07/2020   History of esophageal stricture 10/07/2020   CKD (chronic kidney disease), stage III (HCC) 10/07/2020   Leg weakness 10/07/2020   UTI (urinary tract infection) 10/07/2020   Chronic hypoxemic respiratory failure (HCC) 10/07/2020   Pulmonary hypertension (HCC) 10/06/2020   Chest pain, neg MI, stable CAD non obstructive on cath 10/05/20 10/04/2020   Hardening of the aorta (main artery of the heart) (HCC) 11/25/2019   Hypertensive heart disease with congestive heart failure and chronic kidney disease (HCC) 11/25/2019   Shortness of breath 06/11/2019   Fatty liver 04/08/2019   Migraine 04/08/2019   History of chronic kidney disease 04/08/2019   Acute non-recurrent sinusitis 10/12/2017   Chronic diastolic CHF (congestive heart failure) (HCC) 08/20/2017   Genetic testing 08/17/2017   Family history of ovarian cancer 08/17/2017   Family history of breast cancer 08/17/2017   Cough 04/19/2017   DOE (dyspnea on exertion) 04/19/2017   Type 2 diabetes mellitus with diabetic neuropathy, unspecified (HCC) 01/16/2017   Hypertensive heart disease with heart failure (HCC) 01/01/2017   Hyperlipidemia 01/01/2017   LBBB (left bundle branch block) 01/01/2017   Elevated liver enzymes 12/05/2016   Meralgia paraesthetica 12/05/2016   Tinnitus 12/05/2016   Spinal stenosis of lumbar region with neurogenic claudication 12/05/2016   Wound dehiscence 10/23/2016   S/P lumbar spinal fusion 08/29/2016   OSA (obstructive sleep apnea) 05/24/2016    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   Chronic low back pain without sciatica 03/14/2016   GERD (gastroesophageal reflux disease) 03/14/2016   Gout 03/14/2016   Iron deficiency anemia due to chronic blood loss 03/14/2016   Type 2 diabetes mellitus without complication, with long-term current use of insulin (HCC) 03/14/2016   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HCC) 12/07/2015   Restless leg 12/07/2015   Insomnia 12/07/2015   S/P lumbar laminectomy 11/26/2015   Postprocedural state 11/26/2015   Mild CAD 11/24/2015   Lumbar radiculopathy 10/15/2015   History of blood transfusion 2016   Kidney stone 04/20/2014   Stricture of esophagus 04/20/2014   Greater trochanteric bursitis of right hip 02/02/2012   Iliotibial band syndrome of right side 02/02/2012   Iliotibial band syndrome 02/02/2012   Bursitis, trochanteric 02/02/2012     Problem List Items Addressed This Visit   None    I am having Alleyah B. Ledin maintain her DULoxetine, mirtazapine, nitroGLYCERIN, pramipexole, carvedilol, Hair Skin and Nails Formula, pregabalin, Tresiba FlexTouch, insulin aspart protamine - aspart, oxyCODONE, dapagliflozin propanediol, pantoprazole, Mounjaro, torsemide, ciprofloxacin, and doxycycline.   No orders of the defined types were placed in this encounter.    Follow-up: No follow-ups on file. or sooner if needed.   Marcos Eke, MSN, FNP-C Nurse Practitioner [facility] for Infectious Disease [facility] Main number: [phone]

## 2023-11-06 NOTE — Patient Instructions (Signed)
Nice to see you.  We will check your lab work today.  Continue to take your medication daily as prescribed.  Refills have been sent to the pharmacy.  Plan for follow up in 3 months or sooner if needed with lab work on the same day.  Have a great day and stay safe!

## 2023-11-07 ENCOUNTER — Encounter: Payer: Self-pay | Admitting: Family

## 2023-11-07 LAB — COMPLETE METABOLIC PANEL WITH GFR
AG Ratio: 1.1 (calc) (ref 1.0–2.5)
ALT: 28 U/L (ref 6–29)
AST: 40 U/L — ABNORMAL HIGH (ref 10–35)
Albumin: 3.8 g/dL (ref 3.6–5.1)
Alkaline phosphatase (APISO): 220 U/L — ABNORMAL HIGH (ref 37–153)
BUN/Creatinine Ratio: 24 (calc) — ABNORMAL HIGH (ref 6–22)
BUN: 34 mg/dL — ABNORMAL HIGH (ref 7–25)
CO2: 32 mmol/L (ref 20–32)
Calcium: 9.4 mg/dL (ref 8.6–10.4)
Chloride: 100 mmol/L (ref 98–110)
Creat: 1.4 mg/dL — ABNORMAL HIGH (ref 0.50–1.05)
Globulin: 3.5 g/dL (ref 1.9–3.7)
Glucose, Bld: 69 mg/dL (ref 65–99)
Potassium: 3.7 mmol/L (ref 3.5–5.3)
Sodium: 142 mmol/L (ref 135–146)
Total Bilirubin: 0.5 mg/dL (ref 0.2–1.2)
Total Protein: 7.3 g/dL (ref 6.1–8.1)
eGFR: 41 mL/min/{1.73_m2} — ABNORMAL LOW (ref >=60)

## 2023-11-07 LAB — CBC
HCT: 32.7 % — ABNORMAL LOW (ref 35.0–45.0)
Hemoglobin: 10.7 g/dL — ABNORMAL LOW (ref 11.7–15.5)
MCH: 29.6 pg (ref 27.0–33.0)
MCHC: 32.7 g/dL (ref 32.0–36.0)
MCV: 90.6 fL (ref 80.0–100.0)
MPV: 9.1 fL (ref 7.5–12.5)
Platelets: 154 10*3/uL (ref 140–400)
RBC: 3.61 10*6/uL — ABNORMAL LOW (ref 3.80–5.10)
RDW: 13.9 % (ref 11.0–15.0)
WBC: 5.9 10*3/uL (ref 3.8–10.8)

## 2023-11-07 LAB — C-REACTIVE PROTEIN: CRP: 7 mg/L (ref <8.0)

## 2023-11-07 LAB — SEDIMENTATION RATE: Sed Rate: 80 mm/h — ABNORMAL HIGH (ref 0–30)

## 2023-11-07 NOTE — Assessment & Plan Note (Signed)
Katelyn [name] remains on suppressive ciprofloxacin and doxycycline for previous infection with retained hardware with initial plan to continue antibiotics for at least a year and possibly life long. Tolerating antibiotics with no adverse side effects. Pain is stable to improving. Check blood work for therapeutic drug monitoring. Continue current dose of ciprofloxacin and doxycycline. Plan for follow up in 3 months or sooner if needed with lab work on the same day.

## 2023-11-07 NOTE — Assessment & Plan Note (Signed)
Katelyn [name] has renal function that is stable and remains consistent with CKD Stage IIIb. Creatinine clearance calculated at 39 and confirmed with ID pharmacy okay to continue with current dose of ciprofloxacin 500 mg PO bid. If renal function worsens will need to consider further renal dosing ciprofloxacin.

## 2023-11-22 DIAGNOSIS — K7460 Unspecified cirrhosis of liver: Secondary | ICD-10-CM | POA: Insufficient documentation

## 2023-12-27 ENCOUNTER — Other Ambulatory Visit: Payer: Self-pay | Admitting: Neurological Surgery

## 2023-12-31 NOTE — Pre-Procedure Instructions (Signed)
 Surgical Instructions   Your procedure is scheduled on Friday, April 4th. Report to [facility] Main Entrance "A" at 05:30 A.M., then check in with the Admitting office. Any questions or running late day of surgery: call [phone]  Questions prior to your surgery date: call [phone], Monday-Friday, 8am-4pm. If you experience any cold or flu symptoms such as cough, fever, chills, shortness of breath, etc. between now and your scheduled surgery, please notify us at the above number.     Remember:  Do not eat or drink after midnight the night before your surgery    Take these medicines the morning of surgery with A SIP OF WATER  carvedilol (COREG)  ciprofloxacin (CIPRO)  doxycycline (VIBRA-TABS)  loratadine (CLARITIN)  pantoprazole (PROTONIX)  pregabalin (LYRICA)     May take these medicines IF NEEDED: nitroGLYCERIN (NITROSTAT)- If you have to take this medication prior to surgery, please call [phone] and report this to a nurse    One week prior to surgery, STOP taking any Aspirin (unless otherwise instructed by your surgeon) Aleve, Naproxen, Ibuprofen, Motrin, Advil, Goody's, BC's, all herbal medications, fish oil, and non-prescription vitamins.  WHAT DO I DO ABOUT MY DIABETES MEDICATION?   Stop taking tirzepatide [facility]) 7 days prior to surgery. Last dose on or before 3/27.  THE NIGHT BEFORE SURGERY, take 30-60 units (50%) of TRESIBA FLEXTOUCH    If your CBG is greater than 220 mg/dL, you may take  of your sliding scale (correction) dose of NOVOLOG RELION.  HOW TO MANAGE YOUR DIABETES BEFORE AND AFTER SURGERY  Why is it important to control my blood sugar before and after surgery? Improving blood sugar levels before and after surgery helps healing and can limit problems. A way of improving blood sugar control is eating a healthy diet by:  Eating less sugar and carbohydrates  Increasing activity/exercise  Talking with your doctor about reaching your blood  sugar goals High blood sugars (greater than 180 mg/dL) can raise your risk of infections and slow your recovery, so you will need to focus on controlling your diabetes during the weeks before surgery. Make sure that the doctor who takes care of your diabetes knows about your planned surgery including the date and location.  How do I manage my blood sugar before surgery? Check your blood sugar at least 4 times a day, starting 2 days before surgery, to make sure that the level is not too high or low.  Check your blood sugar the morning of your surgery when you wake up and every 2 hours until you get to the Short Stay unit.  If your blood sugar is less than 70 mg/dL, you will need to treat for low blood sugar: Do not take insulin. Treat a low blood sugar (less than 70 mg/dL) with  cup of clear juice (cranberry or apple), 4 glucose tablets, OR glucose gel. Recheck blood sugar in 15 minutes after treatment (to make sure it is greater than 70 mg/dL). If your blood sugar is not greater than 70 mg/dL on recheck, call [phone] for further instructions. Report your blood sugar to the short stay nurse when you get to Short Stay.  If you are admitted to the hospital after surgery: Your blood sugar will be checked by the staff and you will probably be given insulin after surgery (instead of oral diabetes medicines) to make sure you have good blood sugar levels. The goal for blood sugar control after surgery is 80-180 mg/dL.  Do NOT Smoke (Tobacco/Vaping) for 24 hours prior to your procedure.  If you use a CPAP at night, you may bring your mask/headgear for your overnight stay.   You will be asked to remove any contacts, glasses, piercing's, hearing aid's, dentures/partials prior to surgery. Please bring cases for these items if needed.    Patients discharged the day of surgery will not be allowed to drive home, and someone needs to stay with them for 24 hours.  SURGICAL WAITING  ROOM VISITATION Patients may have no more than 2 support people in the waiting area - these visitors may rotate.   Pre-op nurse will coordinate an appropriate time for 1 ADULT support person, who may not rotate, to accompany patient in pre-op.  Children under the age of 64 must have an adult with them who is not the patient and must remain in the main waiting area with an adult.  If the patient needs to stay at the hospital during part of their recovery, the visitor guidelines for inpatient rooms apply.  Please refer to the [facility] website for the visitor guidelines for any additional information.   If you received a COVID test during your pre-op visit  it is requested that you wear a mask when out in public, stay away from anyone that may not be feeling well and notify your surgeon if you develop symptoms. If you have been in contact with anyone that has tested positive in the last 10 days please notify you surgeon.      Pre-operative 5 CHG Bathing Instructions   You can play a key role in reducing the risk of infection after surgery. Your skin needs to be as free of germs as possible. You can reduce the number of germs on your skin by washing with CHG (chlorhexidine gluconate) soap before surgery. CHG is an antiseptic soap that kills germs and continues to kill germs even after washing.   DO NOT use if you have an allergy to chlorhexidine/CHG or antibacterial soaps. If your skin becomes reddened or irritated, stop using the CHG and notify one of our RNs at [phone].   Please shower with the CHG soap starting 4 days before surgery using the following schedule:     Please keep in mind the following:  DO NOT shave, including legs and underarms, starting the day of your first shower.   You may shave your face at any point before/day of surgery.  Place clean sheets on your bed the day you start using CHG soap. Use a clean washcloth (not used since being washed) for each shower. DO  NOT sleep with pets once you start using the CHG.   CHG Shower Instructions:  Wash your face and private area with normal soap. If you choose to wash your hair, wash first with your normal shampoo.  After you use shampoo/soap, rinse your hair and body thoroughly to remove shampoo/soap residue.  Turn the water OFF and apply about 3 tablespoons (45 ml) of CHG soap to a CLEAN washcloth.  Apply CHG soap ONLY FROM YOUR NECK DOWN TO YOUR TOES (washing for 3-5 minutes)  DO NOT use CHG soap on face, private areas, open wounds, or sores.  Pay special attention to the area where your surgery is being performed.  If you are having back surgery, having someone wash your back for you may be helpful. Wait 2 minutes after CHG soap is applied, then you may rinse off the CHG soap.  Pat dry with a  clean towel  Put on clean clothes/pajamas   If you choose to wear lotion, please use ONLY the CHG-compatible lotions that are listed below.  Additional instructions for the day of surgery: DO NOT APPLY any lotions, deodorants, cologne, or perfumes.   Do not bring valuables to the hospital. [facility] is not responsible for any belongings/valuables. Do not wear nail polish, gel polish, artificial nails, or any other type of covering on natural nails (fingers and toes) Do not wear jewelry or makeup Put on clean/comfortable clothes.  Please brush your teeth.  Ask your nurse before applying any prescription medications to the skin.     CHG Compatible Lotions   Aveeno Moisturizing lotion  Cetaphil Moisturizing Cream  Cetaphil Moisturizing Lotion  Clairol Herbal Essence Moisturizing Lotion, Dry Skin  Clairol Herbal Essence Moisturizing Lotion, Extra Dry Skin  Clairol Herbal Essence Moisturizing Lotion, Normal Skin  Curel Age Defying Therapeutic Moisturizing Lotion with Alpha Hydroxy  Curel Extreme Care Body Lotion  Curel Soothing Hands Moisturizing Hand Lotion  Curel Therapeutic Moisturizing Cream,  Fragrance-Free  Curel Therapeutic Moisturizing Lotion, Fragrance-Free  Curel Therapeutic Moisturizing Lotion, Original Formula  Eucerin Daily Replenishing Lotion  Eucerin Dry Skin Therapy Plus Alpha Hydroxy Crme  Eucerin Dry Skin Therapy Plus Alpha Hydroxy Lotion  Eucerin Original Crme  Eucerin Original Lotion  Eucerin Plus Crme Eucerin Plus Lotion  Eucerin TriLipid Replenishing Lotion  Keri Anti-Bacterial Hand Lotion  Keri Deep Conditioning Original Lotion Dry Skin Formula Softly Scented  Keri Deep Conditioning Original Lotion, Fragrance Free Sensitive Skin Formula  Keri Lotion Fast Absorbing Fragrance Free Sensitive Skin Formula  Keri Lotion Fast Absorbing Softly Scented Dry Skin Formula  Keri Original Lotion  Keri Skin Renewal Lotion Keri Silky Smooth Lotion  Keri Silky Smooth Sensitive Skin Lotion  Nivea Body Creamy Conditioning Oil  Nivea Body Extra Enriched Lotion  Nivea Body Original Lotion  Nivea Body Sheer Moisturizing Lotion Nivea Crme  Nivea Skin Firming Lotion  NutraDerm 30 Skin Lotion  NutraDerm Skin Lotion  NutraDerm Therapeutic Skin Cream  NutraDerm Therapeutic Skin Lotion  ProShield Protective Hand Cream  Provon moisturizing lotion  Please read over the following fact sheets that you were given.

## 2024-01-01 ENCOUNTER — Encounter (HOSPITAL_COMMUNITY)
Admission: RE | Admit: 2024-01-01 | Discharge: 2024-01-01 | Disposition: A | Discharge status: Home / Self Care | Source: Ambulatory Visit | Attending: Neurological Surgery | Admitting: Neurological Surgery

## 2024-01-01 ENCOUNTER — Other Ambulatory Visit: Payer: Self-pay

## 2024-01-01 ENCOUNTER — Encounter (HOSPITAL_COMMUNITY): Payer: Self-pay

## 2024-01-01 VITALS — BP 136/52 | HR 97 | Temp 98.0°F | Resp 17 | Ht 61.0 in | Wt 200.0 lb

## 2024-01-01 DIAGNOSIS — Z01812 Encounter for preprocedural laboratory examination: Secondary | ICD-10-CM | POA: Diagnosis present

## 2024-01-01 DIAGNOSIS — Z794 Long term (current) use of insulin: Secondary | ICD-10-CM | POA: Diagnosis not present

## 2024-01-01 DIAGNOSIS — K7469 Other cirrhosis of liver: Secondary | ICD-10-CM

## 2024-01-01 DIAGNOSIS — E119 Type 2 diabetes mellitus without complications: Secondary | ICD-10-CM | POA: Diagnosis not present

## 2024-01-01 DIAGNOSIS — I2510 Atherosclerotic heart disease of native coronary artery without angina pectoris: Secondary | ICD-10-CM

## 2024-01-01 DIAGNOSIS — Z01818 Encounter for other preprocedural examination: Secondary | ICD-10-CM

## 2024-01-01 HISTORY — DX: Unspecified cirrhosis of liver: K74.60

## 2024-01-01 LAB — CBC
HCT: 30.7 % — ABNORMAL LOW (ref 36.0–46.0)
Hemoglobin: 9.8 g/dL — ABNORMAL LOW (ref 12.0–15.0)
MCH: 29.2 pg (ref 26.0–34.0)
MCHC: 31.9 g/dL (ref 30.0–36.0)
MCV: 91.4 fL (ref 80.0–100.0)
Platelets: 162 10*3/uL (ref 150–400)
RBC: 3.36 MIL/uL — ABNORMAL LOW (ref 3.87–5.11)
RDW: 15.4 % (ref 11.5–15.5)
WBC: 6.5 10*3/uL (ref 4.0–10.5)
nRBC: 0 % (ref 0.0–0.2)

## 2024-01-01 LAB — COMPREHENSIVE METABOLIC PANEL
ALT: 55 U/L — ABNORMAL HIGH (ref 0–44)
AST: 48 U/L — ABNORMAL HIGH (ref 15–41)
Albumin: 2.8 g/dL — ABNORMAL LOW (ref 3.5–5.0)
Alkaline Phosphatase: 234 U/L — ABNORMAL HIGH (ref 38–126)
Anion gap: 8 (ref 5–15)
BUN: 28 mg/dL — ABNORMAL HIGH (ref 8–23)
CO2: 29 mmol/L (ref 22–32)
Calcium: 9 mg/dL (ref 8.9–10.3)
Chloride: 103 mmol/L (ref 98–111)
Creatinine, Ser: 1.19 mg/dL — ABNORMAL HIGH (ref 0.44–1.00)
GFR, Estimated: 50 mL/min — ABNORMAL LOW (ref >60)
Glucose, Bld: 302 mg/dL — ABNORMAL HIGH (ref 70–99)
Potassium: 3.8 mmol/L (ref 3.5–5.1)
Sodium: 140 mmol/L (ref 135–145)
Total Bilirubin: 0.5 mg/dL (ref 0.0–1.2)
Total Protein: 6.2 g/dL — ABNORMAL LOW (ref 6.5–8.1)

## 2024-01-01 LAB — HEMOGLOBIN A1C
Hgb A1c MFr Bld: 7.9 % — ABNORMAL HIGH (ref 4.8–5.6)
Mean Plasma Glucose: 180.03 mg/dL

## 2024-01-01 LAB — PROTIME-INR
INR: 1.1 (ref 0.8–1.2)
Prothrombin Time: 14.1 s (ref 11.4–15.2)

## 2024-01-01 LAB — SURGICAL PCR SCREEN
MRSA, PCR: NEGATIVE
Staphylococcus aureus: NEGATIVE

## 2024-01-01 LAB — GLUCOSE, CAPILLARY: Glucose-Capillary: 232 mg/dL — ABNORMAL HIGH (ref 70–99)

## 2024-01-01 NOTE — Progress Notes (Addendum)
 PCP - Dr. Dina Rich Cardiologist - Dr. Norman Herrlich  PPM/ICD - denies   Chest x-ray - 10/27/21- CE EKG - 03/13/23 Stress Test - 04/09/19 ECHO - 01/17/23 Cardiac Cath - 10/05/20  Sleep Study - OSA+ CPAP - denies, pt unable to tolerate  Fasting Blood Sugar - 200-220 Checks Blood Sugar 3-4 times a day  Last dose of GLP1 agonist-  01/03/24 GLP1 instructions: pt knows not to take any further doses prior to surgery  ASA/Blood Thinner Instructions: n/a   ERAS Protcol - no, NPO   COVID TEST- n/a   Anesthesia review: yes, cardiac hx. Pt is on Cipro and Doxy long-term for osteomyelitis.  Patient denies shortness of breath, fever, cough and chest pain at PAT appointment   All instructions explained to the patient, with a verbal understanding of the material. Patient agrees to go over the instructions while at home for a better understanding.  The opportunity to ask questions was provided.

## 2024-01-02 NOTE — Progress Notes (Signed)
 Anesthesia Chart Review:  68 year old female follows with cardiology for history of nonobstructive CAD LHC 2021, hypertension, LBBB, chronic diastolic heart failure, PAD, HFrEF (EF 40 to 45% by echo 01/2023), pulmonary hypertension, NSVT, OSA intolerant to CPAP.  Last seen by Wallis Bamberg, NP on 07/10/2023.  Noted to be stable at that time without anginal symptoms.  No changes made to management, 41-month follow-up recommended.  Follows with infectious disease for history of prior postoperative infection with Serratia marcescens in 2017 and recently T11/T12 discitis/osteomyelitis with epidural phlegmon from T8-T9 and L1-L3.  Last seen 10/29/2023, continues on suppressive ciprofloxacin and doxycycline for previous infection with retained hardware.  Patient has had numerous spine surgeries, most recently T10-L2 instrumented fusion with concomitant removal of lumbar spinal cord stimulator.  Other pertinent history includes restrictive lung disease, GERD, uncontrolled IDDM 2, CKD stage III, thrombocytopenia, hyperlipidemia, cirrhosis.   Patient reports last dose GLP-1 01/03/2024.  Preop labs reviewed, chronic anemia with hemoglobin 9.8, creatinine mildly elevated 1.19, glucose markedly elevated at 302, A1c 7.9.  Patient presented to the ED on 01/07/2024 with progressive lower extremity weakness.  She was admitted with plan for surgery during admission.  EKG 03/13/2023: Sinus rhythm.  Rate 71. IVCD, consider atypical LBBB  TTE 01/17/2023 [facility] health): Conclusions: Normal sinus rhythm present. There is mild concentric hypertrophy of the left ventricular wall. Left ventricular diastolic function is pseudonormal. Elevated left atrial and left ventricular end-diastolic pressures. Left ventricular ejection fraction is 40 to 45%. Mild diffuse hypokinesis. The left atrium is mildly dilated. There is mild tricuspid valve regurgitation. Pulmonary artery systolic pressure is moderate to severely  elevated. The pressure 62 mmHg.  R/LHC 09/27/2020: Prox LAD lesion is 30% stenosed. 1st Diag lesion is 55% stenosed. Dist Cx lesion is 40% stenosed. Prox RCA lesion is 25% stenosed. Dist RCA lesion is 25% stenosed. There is mild left ventricular systolic dysfunction. LV end diastolic pressure is moderately elevated. The left ventricular ejection fraction is 50-55% by visual estimate. Hemodynamic findings consistent with mild pulmonary hypertension.   1. Nonobstructive CAD. Codominant circulation 2. Low normal LV systolic function. EF estimated at 50%. 3. Elevated LV filling pressures - PCWP 22 mm Hg, EDP 25 mm Hg 4. Mild pulmonary HTN with mean PAP 27 mm Hg 5. Elevated RA pressures 16 mm Hg 6. Reduced cardiac output with index 2.5   Plan: medical management for CAD. Need to repeat Echo to assess LV and RV function. May need to consider pulmonary embolic disease given pulmonary HTN and elevated RA pressures. This may reflect elevated Left heart pressures. Morbid obesity and OSA may also be contributing.     Zannie Cove [facility] Short Stay Center/Anesthesiology Phone [phone] 01/08/2024 12:57 PM

## 2024-01-03 ENCOUNTER — Telehealth: Payer: Self-pay | Admitting: *Deleted

## 2024-01-03 NOTE — Telephone Encounter (Signed)
   Name: Katelyn [name]  DOB: 1956-01-22  MRN: [id number]  Primary Cardiologist: Norman Herrlich, MD  Chart reviewed as part of pre-operative protocol coverage. Because of [name]'s past medical history and time since last visit, she will require a follow-up in-office visit in order to better assess preoperative cardiovascular risk.  Pre-op covering staff: - Please schedule appointment and call patient to inform them. If patient already had an upcoming appointment within acceptable timeframe, please add "pre-op clearance" to the appointment notes so provider is aware. - Please contact requesting surgeon's office via preferred method (i.e, phone, fax) to inform them of need for appointment prior to surgery.  No medications indicated as needing held.   Sharlene Dory, PA-C  01/03/2024, 7:57 AM

## 2024-01-03 NOTE — Telephone Encounter (Signed)
1st attempt to reach pt regarding surgical clearance and the need for an IN-OFFICE appointment.  Left pt a message to call back and get that scheduled. 

## 2024-01-03 NOTE — Telephone Encounter (Signed)
   Pre-operative Risk Assessment    Patient Name: Katelyn [name]  DOB: 1956/02/02 MRN: [id number]   Date of last office visit: 07/10/2023 Date of next office visit: NONE   Request for Surgical Clearance    Procedure:   T7-8, T8-9, T9-10 EXTENSION OF FUSION, REMOVAL OF SCREWS  Date of Surgery:  Clearance 01/11/24                                Surgeon:  Tia Alert Surgeon's Group or Practice Name:  [facility] Phone number:  [phone] Fax number:  [phone]   Type of Clearance Requested:   - Medical    Type of Anesthesia:  General    Additional requests/questions:    [name]   01/03/2024, 6:54 AM

## 2024-01-07 ENCOUNTER — Inpatient Hospital Stay (HOSPITAL_COMMUNITY)

## 2024-01-07 ENCOUNTER — Other Ambulatory Visit: Payer: Self-pay

## 2024-01-07 ENCOUNTER — Encounter (HOSPITAL_COMMUNITY): Payer: Self-pay

## 2024-01-07 ENCOUNTER — Inpatient Hospital Stay (HOSPITAL_COMMUNITY)
Admission: EM | Admit: 2024-01-07 | Discharge: 2024-01-11 | DRG: 448 | Disposition: A | Discharge status: Rehabilitation Facility | Attending: Neurological Surgery | Admitting: Neurological Surgery

## 2024-01-07 DIAGNOSIS — N1832 Chronic kidney disease, stage 3b: Secondary | ICD-10-CM | POA: Diagnosis present

## 2024-01-07 DIAGNOSIS — Z981 Arthrodesis status: Secondary | ICD-10-CM

## 2024-01-07 DIAGNOSIS — M4804 Spinal stenosis, thoracic region: Secondary | ICD-10-CM | POA: Diagnosis present

## 2024-01-07 DIAGNOSIS — E1165 Type 2 diabetes mellitus with hyperglycemia: Secondary | ICD-10-CM | POA: Diagnosis not present

## 2024-01-07 DIAGNOSIS — M1990 Unspecified osteoarthritis, unspecified site: Secondary | ICD-10-CM | POA: Diagnosis present

## 2024-01-07 DIAGNOSIS — I5032 Chronic diastolic (congestive) heart failure: Secondary | ICD-10-CM | POA: Diagnosis present

## 2024-01-07 DIAGNOSIS — I2510 Atherosclerotic heart disease of native coronary artery without angina pectoris: Secondary | ICD-10-CM | POA: Diagnosis present

## 2024-01-07 DIAGNOSIS — G8220 Paraplegia, unspecified: Secondary | ICD-10-CM | POA: Diagnosis present

## 2024-01-07 DIAGNOSIS — M4724 Other spondylosis with radiculopathy, thoracic region: Secondary | ICD-10-CM | POA: Diagnosis present

## 2024-01-07 DIAGNOSIS — E1169 Type 2 diabetes mellitus with other specified complication: Secondary | ICD-10-CM | POA: Diagnosis present

## 2024-01-07 DIAGNOSIS — T84226A Displacement of internal fixation device of vertebrae, initial encounter: Secondary | ICD-10-CM | POA: Diagnosis not present

## 2024-01-07 DIAGNOSIS — Z825 Family history of asthma and other chronic lower respiratory diseases: Secondary | ICD-10-CM

## 2024-01-07 DIAGNOSIS — R739 Hyperglycemia, unspecified: Secondary | ICD-10-CM | POA: Diagnosis not present

## 2024-01-07 DIAGNOSIS — Z794 Long term (current) use of insulin: Secondary | ICD-10-CM

## 2024-01-07 DIAGNOSIS — Z79899 Other long term (current) drug therapy: Secondary | ICD-10-CM | POA: Diagnosis not present

## 2024-01-07 DIAGNOSIS — R339 Retention of urine, unspecified: Secondary | ICD-10-CM | POA: Diagnosis not present

## 2024-01-07 DIAGNOSIS — K7460 Unspecified cirrhosis of liver: Secondary | ICD-10-CM | POA: Diagnosis present

## 2024-01-07 DIAGNOSIS — N319 Neuromuscular dysfunction of bladder, unspecified: Secondary | ICD-10-CM | POA: Diagnosis present

## 2024-01-07 DIAGNOSIS — D631 Anemia in chronic kidney disease: Secondary | ICD-10-CM | POA: Diagnosis present

## 2024-01-07 DIAGNOSIS — E7800 Pure hypercholesterolemia, unspecified: Secondary | ICD-10-CM | POA: Diagnosis present

## 2024-01-07 DIAGNOSIS — G2581 Restless legs syndrome: Secondary | ICD-10-CM | POA: Diagnosis present

## 2024-01-07 DIAGNOSIS — M4714 Other spondylosis with myelopathy, thoracic region: Secondary | ICD-10-CM | POA: Diagnosis present

## 2024-01-07 DIAGNOSIS — M869 Osteomyelitis, unspecified: Secondary | ICD-10-CM | POA: Diagnosis present

## 2024-01-07 DIAGNOSIS — K5901 Slow transit constipation: Secondary | ICD-10-CM | POA: Diagnosis not present

## 2024-01-07 DIAGNOSIS — R1310 Dysphagia, unspecified: Secondary | ICD-10-CM | POA: Diagnosis not present

## 2024-01-07 DIAGNOSIS — Z4789 Encounter for other orthopedic aftercare: Secondary | ICD-10-CM | POA: Diagnosis not present

## 2024-01-07 DIAGNOSIS — I10 Essential (primary) hypertension: Secondary | ICD-10-CM | POA: Diagnosis not present

## 2024-01-07 DIAGNOSIS — R400 Somnolence: Secondary | ICD-10-CM | POA: Diagnosis not present

## 2024-01-07 DIAGNOSIS — Z808 Family history of malignant neoplasm of other organs or systems: Secondary | ICD-10-CM

## 2024-01-07 DIAGNOSIS — G4733 Obstructive sleep apnea (adult) (pediatric): Secondary | ICD-10-CM | POA: Diagnosis present

## 2024-01-07 DIAGNOSIS — N183 Chronic kidney disease, stage 3 unspecified: Secondary | ICD-10-CM | POA: Diagnosis not present

## 2024-01-07 DIAGNOSIS — Z881 Allergy status to other antibiotic agents status: Secondary | ICD-10-CM

## 2024-01-07 DIAGNOSIS — M79606 Pain in leg, unspecified: Secondary | ICD-10-CM | POA: Diagnosis present

## 2024-01-07 DIAGNOSIS — E119 Type 2 diabetes mellitus without complications: Secondary | ICD-10-CM

## 2024-01-07 DIAGNOSIS — E1122 Type 2 diabetes mellitus with diabetic chronic kidney disease: Secondary | ICD-10-CM | POA: Diagnosis present

## 2024-01-07 DIAGNOSIS — E1140 Type 2 diabetes mellitus with diabetic neuropathy, unspecified: Secondary | ICD-10-CM | POA: Diagnosis present

## 2024-01-07 DIAGNOSIS — Z9049 Acquired absence of other specified parts of digestive tract: Secondary | ICD-10-CM

## 2024-01-07 DIAGNOSIS — Z87442 Personal history of urinary calculi: Secondary | ICD-10-CM

## 2024-01-07 DIAGNOSIS — R7401 Elevation of levels of liver transaminase levels: Secondary | ICD-10-CM | POA: Diagnosis not present

## 2024-01-07 DIAGNOSIS — K219 Gastro-esophageal reflux disease without esophagitis: Secondary | ICD-10-CM | POA: Diagnosis present

## 2024-01-07 DIAGNOSIS — I130 Hypertensive heart and chronic kidney disease with heart failure and stage 1 through stage 4 chronic kidney disease, or unspecified chronic kidney disease: Secondary | ICD-10-CM | POA: Diagnosis present

## 2024-01-07 DIAGNOSIS — R609 Edema, unspecified: Secondary | ICD-10-CM | POA: Diagnosis not present

## 2024-01-07 DIAGNOSIS — Z8249 Family history of ischemic heart disease and other diseases of the circulatory system: Secondary | ICD-10-CM

## 2024-01-07 DIAGNOSIS — Z860100 Personal history of colon polyps, unspecified: Secondary | ICD-10-CM

## 2024-01-07 DIAGNOSIS — Z823 Family history of stroke: Secondary | ICD-10-CM

## 2024-01-07 DIAGNOSIS — M532X4 Spinal instabilities, thoracic region: Secondary | ICD-10-CM | POA: Diagnosis present

## 2024-01-07 DIAGNOSIS — Z801 Family history of malignant neoplasm of trachea, bronchus and lung: Secondary | ICD-10-CM

## 2024-01-07 DIAGNOSIS — Z888 Allergy status to other drugs, medicaments and biological substances status: Secondary | ICD-10-CM

## 2024-01-07 DIAGNOSIS — Z7985 Long-term (current) use of injectable non-insulin antidiabetic drugs: Secondary | ICD-10-CM

## 2024-01-07 DIAGNOSIS — Z8659 Personal history of other mental and behavioral disorders: Secondary | ICD-10-CM | POA: Diagnosis not present

## 2024-01-07 DIAGNOSIS — G894 Chronic pain syndrome: Secondary | ICD-10-CM | POA: Diagnosis not present

## 2024-01-07 DIAGNOSIS — Z90710 Acquired absence of both cervix and uterus: Secondary | ICD-10-CM

## 2024-01-07 LAB — CBC WITH DIFFERENTIAL/PLATELET
Abs Immature Granulocytes: 0.03 10*3/uL (ref 0.00–0.07)
Basophils Absolute: 0.1 10*3/uL (ref 0.0–0.1)
Basophils Relative: 1 %
Eosinophils Absolute: 0.4 10*3/uL (ref 0.0–0.5)
Eosinophils Relative: 5 %
HCT: 31.2 % — ABNORMAL LOW (ref 36.0–46.0)
Hemoglobin: 10.2 g/dL — ABNORMAL LOW (ref 12.0–15.0)
Immature Granulocytes: 0 %
Lymphocytes Relative: 23 %
Lymphs Abs: 1.9 10*3/uL (ref 0.7–4.0)
MCH: 29.4 pg (ref 26.0–34.0)
MCHC: 32.7 g/dL (ref 30.0–36.0)
MCV: 89.9 fL (ref 80.0–100.0)
Monocytes Absolute: 1 10*3/uL (ref 0.1–1.0)
Monocytes Relative: 12 %
Neutro Abs: 4.8 10*3/uL (ref 1.7–7.7)
Neutrophils Relative %: 59 %
Platelets: 191 10*3/uL (ref 150–400)
RBC: 3.47 MIL/uL — ABNORMAL LOW (ref 3.87–5.11)
RDW: 15.6 % — ABNORMAL HIGH (ref 11.5–15.5)
WBC: 8.2 10*3/uL (ref 4.0–10.5)
nRBC: 0 % (ref 0.0–0.2)

## 2024-01-07 LAB — BASIC METABOLIC PANEL WITH GFR
Anion gap: 11 (ref 5–15)
BUN: 41 mg/dL — ABNORMAL HIGH (ref 8–23)
CO2: 28 mmol/L (ref 22–32)
Calcium: 9.2 mg/dL (ref 8.9–10.3)
Chloride: 99 mmol/L (ref 98–111)
Creatinine, Ser: 1.55 mg/dL — ABNORMAL HIGH (ref 0.44–1.00)
GFR, Estimated: 37 mL/min — ABNORMAL LOW (ref >60)
Glucose, Bld: 200 mg/dL — ABNORMAL HIGH (ref 70–99)
Potassium: 4.1 mmol/L (ref 3.5–5.1)
Sodium: 138 mmol/L (ref 135–145)

## 2024-01-07 LAB — GLUCOSE, CAPILLARY: Glucose-Capillary: 146 mg/dL — ABNORMAL HIGH (ref 70–99)

## 2024-01-07 MED ORDER — DULOXETINE HCL 60 MG PO CPEP
60.0000 mg | ORAL_CAPSULE | Freq: Every day | ORAL | Status: DC
  Administered 2024-01-07 – 2024-01-10 (×4): 60 mg via ORAL
  Filled 2024-01-07 (×4): qty 1

## 2024-01-07 MED ORDER — ACETAMINOPHEN 650 MG RE SUPP: 650.0000 mg | RECTAL | Status: DC | PRN

## 2024-01-07 MED ORDER — DEXAMETHASONE SODIUM PHOSPHATE 10 MG/ML IJ SOLN: 4.0000 mg | Freq: Four times a day (QID) | INTRAMUSCULAR | Status: DC

## 2024-01-07 MED ORDER — SENNA 8.6 MG PO TABS
1.0000 | ORAL_TABLET | Freq: Two times a day (BID) | ORAL | Status: DC
  Administered 2024-01-07 – 2024-01-08 (×3): 8.6 mg via ORAL
  Filled 2024-01-07 (×3): qty 1

## 2024-01-07 MED ORDER — DOXYCYCLINE HYCLATE 100 MG PO TABS
100.0000 mg | ORAL_TABLET | Freq: Two times a day (BID) | ORAL | Status: DC
  Administered 2024-01-07 – 2024-01-10 (×5): 100 mg via ORAL
  Filled 2024-01-07 (×5): qty 1

## 2024-01-07 MED ORDER — PANTOPRAZOLE SODIUM 40 MG PO TBEC: 40.0000 mg | DELAYED_RELEASE_TABLET | Freq: Every day | ORAL | Status: DC

## 2024-01-07 MED ORDER — DEXAMETHASONE SODIUM PHOSPHATE 4 MG/ML IJ SOLN
4.0000 mg | Freq: Four times a day (QID) | INTRAMUSCULAR | Status: DC
  Administered 2024-01-07 – 2024-01-09 (×2): 4 mg via INTRAVENOUS
  Filled 2024-01-07 (×5): qty 1

## 2024-01-07 MED ORDER — ONDANSETRON HCL 4 MG PO TABS: 4.0000 mg | ORAL_TABLET | Freq: Four times a day (QID) | ORAL | Status: DC | PRN

## 2024-01-07 MED ORDER — TORSEMIDE 20 MG PO TABS
40.0000 mg | ORAL_TABLET | Freq: Every day | ORAL | Status: DC
  Administered 2024-01-08 – 2024-01-11 (×3): 40 mg via ORAL
  Filled 2024-01-07 (×3): qty 2

## 2024-01-07 MED ORDER — DEXAMETHASONE 4 MG PO TABS: 4.0000 mg | ORAL_TABLET | Freq: Four times a day (QID) | ORAL | Status: DC

## 2024-01-07 MED ORDER — ACETAMINOPHEN 325 MG PO TABS: 650.0000 mg | ORAL_TABLET | ORAL | Status: DC | PRN

## 2024-01-07 MED ORDER — DEXAMETHASONE 4 MG PO TABS
4.0000 mg | ORAL_TABLET | Freq: Four times a day (QID) | ORAL | Status: DC
  Administered 2024-01-08 – 2024-01-09 (×5): 4 mg via ORAL
  Filled 2024-01-07 (×6): qty 1

## 2024-01-07 MED ORDER — MORPHINE SULFATE (PF) 2 MG/ML IV SOLN
2.0000 mg | INTRAVENOUS | Status: DC | PRN
  Administered 2024-01-07 – 2024-01-09 (×3): 2 mg via INTRAVENOUS
  Filled 2024-01-07 (×3): qty 1

## 2024-01-07 MED ORDER — POTASSIUM CHLORIDE IN NACL 20-0.9 MEQ/L-% IV SOLN
INTRAVENOUS | Status: AC
  Filled 2024-01-07 (×2): qty 1000

## 2024-01-07 MED ORDER — PRAMIPEXOLE DIHYDROCHLORIDE 1 MG PO TABS
1.0000 mg | ORAL_TABLET | Freq: Every day | ORAL | Status: DC
  Administered 2024-01-07 – 2024-01-10 (×4): 1 mg via ORAL
  Filled 2024-01-07 (×5): qty 1

## 2024-01-07 MED ORDER — SODIUM CHLORIDE 0.9% FLUSH: 3.0000 mL | INTRAVENOUS | Status: DC | PRN

## 2024-01-07 MED ORDER — TORSEMIDE 20 MG PO TABS: 40.0000 mg | ORAL_TABLET | Freq: Every day | ORAL | Status: DC

## 2024-01-07 MED ORDER — HYDROCODONE-ACETAMINOPHEN 5-325 MG PO TABS
1.0000 | ORAL_TABLET | ORAL | Status: DC | PRN
  Administered 2024-01-08 (×2): 1 via ORAL
  Filled 2024-01-07 (×2): qty 1

## 2024-01-07 MED ORDER — SODIUM CHLORIDE 0.9 % IV SOLN: 250.0000 mL | INTRAVENOUS | Status: AC

## 2024-01-07 MED ORDER — ONDANSETRON HCL 4 MG/2ML IJ SOLN: 4.0000 mg | Freq: Four times a day (QID) | INTRAMUSCULAR | Status: DC | PRN

## 2024-01-07 MED ORDER — IOHEXOL 350 MG/ML SOLN
75.0000 mL | Freq: Once | INTRAVENOUS | Status: AC | PRN
  Administered 2024-01-07: 75 mL via INTRAVENOUS

## 2024-01-07 MED ORDER — SODIUM CHLORIDE 0.9% FLUSH
3.0000 mL | Freq: Two times a day (BID) | INTRAVENOUS | Status: DC
  Administered 2024-01-07 – 2024-01-09 (×4): 3 mL via INTRAVENOUS

## 2024-01-07 MED ORDER — CIPROFLOXACIN HCL 500 MG PO TABS
500.0000 mg | ORAL_TABLET | Freq: Two times a day (BID) | ORAL | Status: DC
  Administered 2024-01-07 – 2024-01-10 (×5): 500 mg via ORAL
  Filled 2024-01-07 (×5): qty 1

## 2024-01-07 MED ORDER — PREGABALIN 75 MG PO CAPS
75.0000 mg | ORAL_CAPSULE | Freq: Two times a day (BID) | ORAL | Status: DC
  Administered 2024-01-07 – 2024-01-11 (×7): 75 mg via ORAL
  Filled 2024-01-07 (×7): qty 1

## 2024-01-07 MED ORDER — ADULT MULTIVITAMIN W/MINERALS CH: 1.0000 | ORAL_TABLET | Freq: Every day | ORAL | Status: DC

## 2024-01-07 MED ORDER — CARVEDILOL 6.25 MG PO TABS
6.2500 mg | ORAL_TABLET | Freq: Two times a day (BID) | ORAL | Status: DC
  Administered 2024-01-07 – 2024-01-09 (×4): 6.25 mg via ORAL
  Filled 2024-01-07 (×4): qty 1

## 2024-01-07 MED ORDER — METHOCARBAMOL 1000 MG/10ML IJ SOLN: 500.0000 mg | Freq: Four times a day (QID) | INTRAMUSCULAR | Status: DC | PRN

## 2024-01-07 MED ORDER — INSULIN ASPART 100 UNIT/ML IJ SOLN
0.0000 [IU] | Freq: Three times a day (TID) | INTRAMUSCULAR | Status: DC
Start: 2024-01-08 — End: 2024-01-10
  Administered 2024-01-08: 15 [IU] via SUBCUTANEOUS
  Administered 2024-01-08: 8 [IU] via SUBCUTANEOUS
  Administered 2024-01-08: 11 [IU] via SUBCUTANEOUS
  Administered 2024-01-09: 15 [IU] via SUBCUTANEOUS
  Administered 2024-01-09: 11 [IU] via SUBCUTANEOUS
  Administered 2024-01-10: 15 [IU] via SUBCUTANEOUS
  Administered 2024-01-10: 11 [IU] via SUBCUTANEOUS

## 2024-01-07 MED ORDER — MIRTAZAPINE 15 MG PO TABS
30.0000 mg | ORAL_TABLET | Freq: Every day | ORAL | Status: DC
  Administered 2024-01-07 – 2024-01-10 (×4): 30 mg via ORAL
  Filled 2024-01-07 (×4): qty 2

## 2024-01-07 MED ORDER — METHOCARBAMOL 500 MG PO TABS
500.0000 mg | ORAL_TABLET | Freq: Four times a day (QID) | ORAL | Status: DC | PRN
  Administered 2024-01-07 – 2024-01-08 (×3): 500 mg via ORAL
  Filled 2024-01-07 (×2): qty 1

## 2024-01-07 MED ORDER — ADULT MULTIVITAMIN W/MINERALS CH
1.0000 | ORAL_TABLET | Freq: Every day | ORAL | Status: DC
  Administered 2024-01-08 – 2024-01-11 (×3): 1 via ORAL
  Filled 2024-01-07 (×3): qty 1

## 2024-01-07 MED ORDER — PANTOPRAZOLE SODIUM 40 MG PO TBEC
40.0000 mg | DELAYED_RELEASE_TABLET | Freq: Every day | ORAL | Status: DC
  Administered 2024-01-08: 40 mg via ORAL
  Filled 2024-01-07: qty 1

## 2024-01-07 NOTE — H&P (Signed)
 Katelyn [name] is an 68 y.o. female.   HPI:  68 year old female presented to the ED today after having progressive leg weakness. She has known PJK with likley infection at T9-T10. I saw her in the office last Thursday and she felt like she had foot drop when trying to walk with her walker but I did not appreciate any on exam. She was scheduled for surgery this coming Friday. Last Friday she states that her legs "stopped working." She did not call anyone until today and I spoke with her over the phone informing her to go to the ED asap. She reports mid thoracic pain that feels like a burning sensation. She denies any numbness or tingling in her legs. Also denies any urinary changes or saddle anesthesia  Past Medical History:  Diagnosis Date   Anxiety    Arthritis    Bursitis of right hip    CAD (coronary artery disease)    Cardiac catheterization June 2014 in [place] - 50% circumflex stenosis   Chest pain, neg MI, stable CAD non obstructive on cath 10/05/20 10/04/2020   Chronic diastolic heart failure (HCC) 08/20/2017   Chronic kidney disease, stage 3 (HCC)    does not see nephrologist   Chronic low back pain without sciatica 03/14/2016   Cirrhosis of liver (HCC)    CKD (chronic kidney disease), stage III (HCC) 10/07/2020   Complication of anesthesia    Cough 04/19/2017   Overview:  Last Assessment & Plan:  Formatting of this note may be different from the original. Cough - ? ACE related with AR triggers   Plan  Patient Instructions  Discuss with your primary doctor that lisinopril pain, need making your cough worse. May use Mucinex DM twice daily as needed for cough and congestion Zyrtec 10 mg at bedtime as needed for drainage Saline nasal spray as needed. Lab tests today Activity as tolerated. Follow with Dr. Craige Cotta in 3-4 months and As needed   Please contact office for sooner follow up if symptoms do not improve or worsen or seek emergency care    Depression    Dyspnea    with exertion    " lazy lung" - per  Dr Craige Cotta from back issues- 06/2016   Elevated liver enzymes 12/05/2016   Essential hypertension    GERD (gastroesophageal reflux disease)    Gout 03/14/2016   Greater trochanteric bursitis of right hip 02/02/2012   H/O hiatal hernia    Heart murmur    History of blood transfusion 2016   History of esophageal stricture 10/07/2020   History of kidney stones    Hypercholesterolemia    Hypertensive heart disease with heart failure (HCC) 01/01/2017   Hypoxia 10/07/2020   Iliotibial band syndrome of right side 02/02/2012   Iron deficiency anemia due to chronic blood loss 03/14/2016   LBBB (left bundle branch block) 01/01/2017   Left bundle branch block    Leg weakness 10/07/2020   Lumbar stenosis    Meralgia paraesthetica 12/05/2016   Mild CAD 11/24/2015   Morbid obesity (HCC) 10/07/2020   Neuropathy    OSA (obstructive sleep apnea) 05/24/2016   Overview:  Managed PULM- no CPAP   PONV (postoperative nausea and vomiting)    "no N/V with patch"   Restless leg syndrome    S/P lumbar laminectomy 11/26/2015   S/P lumbar spinal fusion 08/29/2016   Tinnitus 12/05/2016   Type 2 diabetes mellitus (HCC)    UTI (urinary tract infection) 10/07/2020  Past Surgical History:  Procedure Laterality Date   ABDOMINAL HYSTERECTOMY  1983   APPENDECTOMY  Age 74   BACK SURGERY     FIRST LUMBAR FUSION/ SURGERY APRIL 2012 AND FUSION WITH INSTRUMENTATION SEPT 2012   CARDIAC CATHETERIZATION     x 2   CARPAL TUNNEL RELEASE     bil   CHOLECYSTECTOMY  1990's   COLONOSCOPY  12/12/2017   Colonic polyp status post polypectomy. Mild sigmoid diverticulosis. Otherwise normal colonoscopy to terminal ileum.   CYSTO EXTRACTION KIDNEY STONES     ESOPHAGOGASTRODUODENOSCOPY  12/12/2017   Small hiatal hernia. Mild gastritis. Status post esophageal dilatation.   EXCISION/RELEASE BURSA HIP  02/02/2012   Procedure: EXCISION/RELEASE BURSA HIP;  Surgeon: Jacki Cones, MD;  Location: [facility] ORS;   Service: Orthopedics;  Laterality: Right;  Right Hip Bursectomy   EYE SURGERY Bilateral    cataracts   IR THORACIC DISC ASPIRATION W/IMG GUIDE  02/14/2023   KIDNEY STONE SURGERY  2008   LAMINECTOMY WITH POSTERIOR LATERAL ARTHRODESIS LEVEL 4 N/A 02/19/2023   Procedure: THORACIC TEN-LUMBAR TWO INSTRUMENTED FUSION;  Surgeon: Tia Alert, MD;  Location: [facility] OR;  Service: Neurosurgery;  Laterality: N/A;   Left knee surgery x 2  1996   reconstruction   LUMBAR DISC SURGERY  08/2016   LUMBAR LAMINECTOMY/DECOMPRESSION MICRODISCECTOMY Right 11/26/2015   Procedure: Extraforaminal Microdiscectomy  - Lumbar two-three- right;  Surgeon: Tia Alert, MD;  Location: [facility];  Service: Neurosurgery;  Laterality: Right;  right    LUMBAR WOUND DEBRIDEMENT N/A 10/25/2016   Procedure: Lumbar wound revision;  Surgeon: Tia Alert, MD;  Location: [facility] OR;  Service: Neurosurgery;  Laterality: N/A;  Lumbar wound revision   Right shoulder surgery  2010   spur   RIGHT/LEFT HEART CATH AND CORONARY ANGIOGRAPHY N/A 10/05/2020   Procedure: RIGHT/LEFT HEART CATH AND CORONARY ANGIOGRAPHY;  Surgeon: [place]az[place], Peter M, MD;  Location: [facility] INVASIVE CV LAB;  Service: Cardiovascular;  Laterality: N/A;   SPINAL CORD STIMULATOR REMOVAL  02/19/2023   Procedure: LUMBAR SPINAL CORD STIMULATOR REMOVAL;  Surgeon: Tia Alert, MD;  Location: [facility] OR;  Service: Neurosurgery;;    Allergies  Allergen Reactions   Atorvastatin Nausea And Vomiting and Other (See Comments)    MYALGIAS   Talwin [Pentazocine] Other (See Comments)    headache   Gabapentin Swelling   Other Nausea Only    UNSPECIFIED Anesthesia   Cefepime Other (See Comments)    Presumed AMS/neurotoxicity in the setting of AKI    Social History   Tobacco Use   Smoking status: Never   Smokeless tobacco: Never   Tobacco comments:    Prior secondhand smoke  Substance Use Topics   Alcohol use: Not Currently    Family History  Problem Relation Age of Onset   Lung  cancer Father        smoked   Hypertension Father    Stroke Father    Heart failure Mother    Bone cancer Sister    Asthma Sister      Review of Systems  Positive ROS: as above  All other systems have been reviewed and were otherwise negative with the exception of those mentioned in the HPI and as above.  Objective: Vital signs in last 24 hours: Temp:  [98.1 F (36.7 C)] 98.1 F (36.7 C) (03/31 1641) Pulse Rate:  [98] 98 (03/31 1641) Resp:  [16] 16 (03/31 1641) BP: (145)/(78) 145/78 (03/31 1641) SpO2:  [99 %] 99 % (  03/31 1641)  General Appearance: Alert, cooperative, no distress, appears stated age Head: Normocephalic, without obvious abnormality, atraumatic Eyes: PERRL, conjunctiva/corneas clear, EOM's intact, fundi benign, both eyes      Lungs: respirations unlabored Heart: Regular rate and rhythm, S1 and S2 normal, no murmur, rub or gallop Abdomen: Soft, non-tender, bowel sounds active all four quadrants, no masses, no organomegaly Extremities: Extremities normal, atraumatic, no cyanosis or edema Pulses: 2+ and symmetric all extremities Skin: Skin color, texture, turgor normal, no rashes or lesions  NEUROLOGIC:   Mental status: A&O x4, no aphasia, good attention span, Memory and fund of knowledge Motor Exam - Left dorsiflexion 2/5, R dorsiflexion 1/5, B knee extension 2/, B hip flexors 0/5 Sensory Exam - grossly normal Reflexes: symmetric, no pathologic reflexes, No Hoffman's, No clonus Coordination - grossly normal Gait - unable to walk Balance -  UTA Cranial Nerves: I: smell Not tested  II: visual acuity  OS: na    OD: na  II: visual fields Full to confrontation  II: pupils Equal, round, reactive to light  III,VII: ptosis None  III,IV,VI: extraocular muscles  Full ROM  V: mastication   V: facial light touch sensation    V,VII: corneal reflex    VII: facial muscle function - upper    VII: facial muscle function - lower   VIII: hearing   IX: soft palate  elevation    IX,X: gag reflex   XI: trapezius strength    XI: sternocleidomastoid strength   XI: neck flexion strength    XII: tongue strength      Data Review Lab Results  Component Value Date   WBC 6.5 01/01/2024   HGB 9.8 (L) 01/01/2024   HCT 30.7 (L) 01/01/2024   MCV 91.4 01/01/2024   PLT 162 01/01/2024   Lab Results  Component Value Date   NA 140 01/01/2024   K 3.8 01/01/2024   CL 103 01/01/2024   CO2 29 01/01/2024   BUN 28 (H) 01/01/2024   CREATININE 1.19 (H) 01/01/2024   GLUCOSE 302 (H) 01/01/2024   Lab Results  Component Value Date   INR 1.1 01/01/2024      Assessment/Plan: 68 year old female presented to the ED today with significant weakness in her lower extremities. She likely has more cord compression at T9-T10. We will get a stat CT myelogram either tonight or tomorrow and will put her on the OR scheduled for Wednesday for extension of her thoracic fusion. We discussed the plan of care with the patient and her husband. No blood thinners please.    Tiana Loft Arles Rumbold 01/07/2024 4:44 PM

## 2024-01-07 NOTE — ED Triage Notes (Signed)
 Pt came in via POV d/t the provider Marikay Alar, MD telling her to come in d/t her starting to have drop foot & difficulty using her legs on 01/03/24, was able to get in touch with him today & told her to come in for admission & surgery sooner than expected. Pt reports that she had a bone stimulator removed from her back in December 2024 d/t it causing sporadic cramps & pain, plus a pocket of infection seen next to were it is located. When her stimulator was removed she had T11 repairs with rods/plates/screws. Was Tx with IV ABT for the infection & is now scheduled for repair of her T10 this coming Friday (01/11/24). Marikay Alar, MD saw pt in the lobby before triage & told pt she may need her back surgery moved up to this Wednesday & she is to be admitted until then (per pt). A/Ox4, rates her pain 8/10 in her back during triage.

## 2024-01-07 NOTE — Telephone Encounter (Signed)
 Pt scheduled to see Wallis Bamberg, NP, 01/10/24, clearance will be addressed at that time.  Will route to requesting surgeon's office to make them aware.

## 2024-01-07 NOTE — ED Provider Triage Note (Signed)
 Emergency Medicine Provider Triage Evaluation Note  Katelyn [name] , a 68 y.o. female  was evaluated in triage.  Pt complains of 10 by Dr. Marikay Alar for admission and surgery earlier than second.  Patient has had difficulty using her leg since 01/03/2024.  Patient to have repair of her T10.  Dr. Yetta Barre while patient in the lobby for triage and said that she is to be admitted until surgery likely on Wednesday.  Doctor has put in CT imaging.  Review of Systems  Positive: Lower extremity pain and weakness, foot drop Negative: Fever, chills, nausea, vomiting  Physical Exam  BP (!) 145/78 (BP Location: Right Arm)   Pulse 98   Temp 98.1 F (36.7 C)   Resp 16   Ht 5\' 1"  (1.549 m)   Wt 90.7 kg   SpO2 99%   BMI 37.79 kg/m  Gen:   Awake, no distress   Resp:  Normal effort  MSK:   Difficulty moving bilateral lower extremities Other:    Medical Decision Making  Medically screening exam initiated at 5:22 PM.  Appropriate orders placed.  Katelyn [name] was informed that the remainder of the evaluation will be completed by another provider, this initial triage assessment does not replace that evaluation, and the importance of remaining in the ED until their evaluation is complete.  Imaging placed by Dr. Yetta Barre Basic labs placed   Dolphus Jenny, [place] 01/07/24 [phone]

## 2024-01-08 ENCOUNTER — Inpatient Hospital Stay (HOSPITAL_COMMUNITY)

## 2024-01-08 LAB — GLUCOSE, CAPILLARY
Glucose-Capillary: 334 mg/dL — ABNORMAL HIGH (ref 70–99)
Glucose-Capillary: 296 mg/dL — ABNORMAL HIGH (ref 70–99)
Glucose-Capillary: 388 mg/dL — ABNORMAL HIGH (ref 70–99)

## 2024-01-08 MED ORDER — IOHEXOL 300 MG/ML  SOLN
10.0000 mL | Freq: Once | INTRAMUSCULAR | Status: AC | PRN
  Administered 2024-01-08: 10 mL via INTRATHECAL

## 2024-01-08 MED ORDER — SODIUM CHLORIDE 0.9 % IV BOLUS: 500.0000 mL | Freq: Once | INTRAVENOUS | Status: DC

## 2024-01-08 MED ORDER — LIDOCAINE HCL (PF) 1 % IJ SOLN
5.0000 mL | Freq: Once | INTRAMUSCULAR | Status: AC
  Administered 2024-01-08: 3 mL

## 2024-01-08 NOTE — Anesthesia Preprocedure Evaluation (Signed)
 Anesthesia Evaluation  Patient identified by MRN, date of birth, ID band Patient awake    Reviewed: Allergy & Precautions, H&P , NPO status , Patient's Chart, lab work & pertinent test results, reviewed documented beta blocker date and time   History of Anesthesia Complications (+) PONV and history of anesthetic complications  Airway Mallampati: III  TM Distance: >3 FB Neck ROM: Full    Dental no notable dental hx. (+) Teeth Intact, Dental Advisory Given   Pulmonary sleep apnea    Pulmonary exam normal breath sounds clear to auscultation       Cardiovascular hypertension, Pt. on medications and Pt. on home beta blockers + CAD, + Peripheral Vascular Disease and + DOE   Rhythm:Regular Rate:Normal     Neuro/Psych  Headaches  Anxiety Depression       GI/Hepatic Neg liver ROS, hiatal hernia,GERD  Medicated,,  Endo/Other  diabetes, Insulin Dependent  Class 3 obesity  Renal/GU Renal InsufficiencyRenal disease  negative genitourinary   Musculoskeletal  (+) Arthritis ,    Abdominal   Peds  Hematology  (+) Blood dyscrasia, anemia   Anesthesia Other Findings   Reproductive/Obstetrics negative OB ROS                             Anesthesia Physical Anesthesia Plan  ASA: 3  Anesthesia Plan: General   Post-op Pain Management: Ofirmev IV (intra-op)*   Induction: Intravenous  PONV Risk Score and Plan: 4 or greater and Ondansetron, Dexamethasone and Midazolam  Airway Management Planned: Oral ETT  Additional Equipment: Arterial line  Intra-op Plan:   Post-operative Plan: Extubation in OR  Informed Consent: I have reviewed the patients History and Physical, chart, labs and discussed the procedure including the risks, benefits and alternatives for the proposed anesthesia with the patient or authorized representative who has indicated his/her understanding and acceptance.     Dental advisory  given  Plan Discussed with: CRNA  Anesthesia Plan Comments: (PAT note by Antionette Poles, PA-C: 68 year old female follows with cardiology for history of nonobstructive CAD LHC 2021, hypertension, LBBB, chronic diastolic heart failure, PAD, HFrEF (EF 40 to 45% by echo 01/2023), pulmonary hypertension, NSVT, OSA intolerant to CPAP.  Last seen by Wallis Bamberg, NP on 07/10/2023.  Noted to be stable at that time without anginal symptoms.  No changes made to management, 35-month follow-up recommended.   Follows with infectious disease for history of prior postoperative infection with Serratia marcescens in 2017 and recently T11/T12 discitis/osteomyelitis with epidural phlegmon from T8-T9 and L1-L3.  Last seen 10/29/2023, continues on suppressive ciprofloxacin and doxycycline for previous infection with retained hardware.  Patient has had numerous spine surgeries, most recently T10-L2 instrumented fusion with concomitant removal of lumbar spinal cord stimulator.   Other pertinent history includes restrictive lung disease, GERD, uncontrolled IDDM 2, CKD stage III, thrombocytopenia, hyperlipidemia, cirrhosis.    Patient reports last dose GLP-1 01/03/2024.   Preop labs reviewed, chronic anemia with hemoglobin 9.8, creatinine mildly elevated 1.19, glucose markedly elevated at 302, A1c 7.9.   Patient presented to the ED on 01/07/2024 with progressive lower extremity weakness.  She was admitted with plan for surgery during admission.   EKG 03/13/2023: Sinus rhythm.  Rate 71. IVCD, consider atypical LBBB   TTE 01/17/2023 [facility] health): Conclusions: Normal sinus rhythm present. There is mild concentric hypertrophy of the left ventricular wall. Left ventricular diastolic function is pseudonormal. Elevated left atrial and left ventricular end-diastolic pressures. Left ventricular ejection  fraction is 40 to 45%. Mild diffuse hypokinesis. The left atrium is mildly dilated. There is mild tricuspid valve  regurgitation. Pulmonary artery systolic pressure is moderate to severely elevated. The pressure 62 mmHg.   R/LHC 09/27/2020:  Prox LAD lesion is 30% stenosed.  1st Diag lesion is 55% stenosed.  Dist Cx lesion is 40% stenosed.  Prox RCA lesion is 25% stenosed.  Dist RCA lesion is 25% stenosed.  There is mild left ventricular systolic dysfunction.  LV end diastolic pressure is moderately elevated.  The left ventricular ejection fraction is 50-55% by visual estimate.  Hemodynamic findings consistent with mild pulmonary hypertension.   1. Nonobstructive CAD. Codominant circulation 2. Low normal LV systolic function. EF estimated at 50%. 3. Elevated LV filling pressures - PCWP 22 mm Hg, EDP 25 mm Hg 4. Mild pulmonary HTN with mean PAP 27 mm Hg 5. Elevated RA pressures 16 mm Hg 6. Reduced cardiac output with index 2.5   Plan: medical management for CAD. Need to repeat Echo to assess LV and RV function. May need to consider pulmonary embolic disease given pulmonary HTN and elevated RA pressures. This may reflect elevated Left heart pressures. Morbid obesity and OSA may also be contributing.      )        Anesthesia Quick Evaluation

## 2024-01-08 NOTE — Progress Notes (Signed)
 Presents with weakness lower extremities, ? more cord compression at T9-T10.  Plan:a stat CT myelogram  and scheduled for  the OR  Wednesday for extension of her thoracic fusion.    01/08/24 1601  TOC Brief Assessment  Insurance and Status Reviewed  Patient has primary care physician Yes  Home environment has been reviewed From home with husband  Prior level of function: PTA lost usage of legs X1 week,  Owns RW, ROLLATOR, BSC, W/C.  Prior/Current Home Services No current home services  Social Drivers of Health Review SDOH reviewed no interventions necessary  Readmission risk has been reviewed No  Transition of care needs transition of care needs identified, TOC will continue to follow   Gae Gallop RN,BSN,CM [phone]

## 2024-01-08 NOTE — Inpatient Diabetes Management (Signed)
 Inpatient Diabetes Program Recommendations  AACE/ADA: New Consensus Statement on Inpatient Glycemic Control (2015)  Target Ranges:  Prepandial:   less than 140 mg/dL      Peak postprandial:   less than 180 mg/dL (1-2 hours)      Critically ill patients:  140 - 180 mg/dL   Lab Results  Component Value Date   GLUCAP 296 (H) 01/08/2024   HGBA1C 7.9 (H) 01/01/2024    Review of Glycemic Control  Latest Reference Range & Units 01/07/24 21:28 01/08/24 06:25 01/08/24 12:56  Glucose-Capillary 70 - 99 mg/dL 161 (H) 096 (H) 045 (H)  (H): Data is abnormally high Diabetes history: Type 2 DM Outpatient Diabetes medications: Mounjaro 5 mg qwk, Novolin R 2-20 units TID, Tresiba 60-120 units QD Current orders for Inpatient glycemic control: Novolog 0-15 units TID Decadron 4 mg Q6H  Inpatient Diabetes Program Recommendations:    Consider adding Semglee 8 units every day.   Thanks, Lujean Rave, MSN, RNC-OB Diabetes Coordinator [phone] (8a-5p)

## 2024-01-08 NOTE — Progress Notes (Signed)
 Patient ID: Katelyn [name], female   DOB: 1956/05/09, 68 y.o.   MRN: [id number] Looks like we had trouble getting the right study done as she is now had 2 CT scans of the thoracic spine with intravenous contrast, she has finally had her thoracic myelogram.  I have reviewed the images.  The looks to be significant stenosis of the T10 level where she has sequelae of discitis.  I have explained the images to her.  Her exam is unchanged.  She has about 2 out of 5 strength in the left lower extremity and 1-2 out of 5 strength in the right lower extremity.  She has very little hip flexion but can lift her heels off the bed if you hold her hands under her knees, does have sensation.  She does have urinary retention and now has a Foley catheter in place.  Denies significant numbness.  She does have significant thoracic back pain.  I have recommended a thoracic laminectomy T10.  Then extend her instrumentation to T7-T9 to pedicle screws at those levels and then placed a rod T7 down to the old construct.  She understands the risk of the surgery include but are not limited to bleeding, infection, CSF leak, spinal cord injury, nerve root injury, numbness, weakness, paralysis, lack of relief of symptoms, worsening symptoms, pseudoarthrosis, misplaced instrumentation, need for further surgery, loss of bowel bladder or sexual function, lung disease, and anesthesia risk including DVT pneumonia MI and death.  She agrees to proceed and we will plan to do so tomorrow

## 2024-01-08 NOTE — Progress Notes (Signed)
 Patient currently in myelogram suite. We will take a look at the results of this later today. She is still on the schedule for the OR tomorrow. NPO after midnight today and bedrest for the rest of the day today.

## 2024-01-08 NOTE — Plan of Care (Signed)

## 2024-01-08 NOTE — Procedures (Signed)
 Interventional Radiology Procedure Note  Procedure: thoracic myelogram pre CT    Complications: None  Estimated Blood Loss:  0  Findings: 10cc omni300 into thecal sac without difficulty Ct to follow     M. Ruel Favors, MD

## 2024-01-09 ENCOUNTER — Other Ambulatory Visit: Payer: Self-pay

## 2024-01-09 ENCOUNTER — Encounter (HOSPITAL_COMMUNITY)
Admission: EM | Disposition: A | Discharge status: Rehabilitation Facility | Payer: Self-pay | Source: Home / Self Care | Attending: Neurological Surgery

## 2024-01-09 ENCOUNTER — Encounter (HOSPITAL_COMMUNITY): Payer: Self-pay | Admitting: Neurological Surgery

## 2024-01-09 ENCOUNTER — Inpatient Hospital Stay (HOSPITAL_COMMUNITY)

## 2024-01-09 ENCOUNTER — Inpatient Hospital Stay (HOSPITAL_COMMUNITY): Admission: RE | Admit: 2024-01-09 | Source: Home / Self Care | Admitting: Neurological Surgery

## 2024-01-09 ENCOUNTER — Inpatient Hospital Stay (HOSPITAL_COMMUNITY): Payer: Self-pay | Admitting: Physician Assistant

## 2024-01-09 ENCOUNTER — Inpatient Hospital Stay (HOSPITAL_COMMUNITY): Admitting: Physician Assistant

## 2024-01-09 DIAGNOSIS — I10 Essential (primary) hypertension: Secondary | ICD-10-CM | POA: Diagnosis not present

## 2024-01-09 DIAGNOSIS — T84226A Displacement of internal fixation device of vertebrae, initial encounter: Secondary | ICD-10-CM

## 2024-01-09 DIAGNOSIS — M4804 Spinal stenosis, thoracic region: Secondary | ICD-10-CM

## 2024-01-09 DIAGNOSIS — I2510 Atherosclerotic heart disease of native coronary artery without angina pectoris: Secondary | ICD-10-CM | POA: Diagnosis not present

## 2024-01-09 HISTORY — PX: LAMINECTOMY WITH POSTERIOR LATERAL ARTHRODESIS LEVEL 3: SHX6337

## 2024-01-09 LAB — PREPARE RBC (CROSSMATCH)

## 2024-01-09 LAB — GLUCOSE, CAPILLARY
Glucose-Capillary: 372 mg/dL — ABNORMAL HIGH (ref 70–99)
Glucose-Capillary: 342 mg/dL — ABNORMAL HIGH (ref 70–99)
Glucose-Capillary: 232 mg/dL — ABNORMAL HIGH (ref 70–99)
Glucose-Capillary: 234 mg/dL — ABNORMAL HIGH (ref 70–99)
Glucose-Capillary: 248 mg/dL — ABNORMAL HIGH (ref 70–99)

## 2024-01-09 LAB — TYPE AND SCREEN
ABO/RH(D): A POS
Antibody Screen: NEGATIVE

## 2024-01-09 LAB — POCT I-STAT 7, (LYTES, BLD GAS, ICA,H+H)
Acid-Base Excess: 2 mmol/L (ref 0.0–2.0)
Bicarbonate: 26.3 mmol/L (ref 20.0–28.0)
Calcium, Ion: 1.18 mmol/L (ref 1.15–1.40)
HCT: 26 % — ABNORMAL LOW (ref 36.0–46.0)
Hemoglobin: 8.8 g/dL — ABNORMAL LOW (ref 12.0–15.0)
O2 Saturation: 100 %
Patient temperature: 36.2
Potassium: 4.4 mmol/L (ref 3.5–5.1)
Sodium: 136 mmol/L (ref 135–145)
TCO2: 28 mmol/L (ref 22–32)
pCO2 arterial: 37.4 mmHg (ref 32–48)
pH, Arterial: 7.452 — ABNORMAL HIGH (ref 7.35–7.45)
pO2, Arterial: 266 mmHg — ABNORMAL HIGH (ref 83–108)

## 2024-01-09 LAB — MRSA NEXT GEN BY PCR, NASAL: MRSA by PCR Next Gen: NOT DETECTED

## 2024-01-09 SURGERY — LAMINECTOMY WITH POSTERIOR LATERAL ARTHRODESIS LEVEL 3
Anesthesia: General | Site: Back

## 2024-01-09 MED ORDER — ORAL CARE MOUTH RINSE: 15.0000 mL | Freq: Once | OROMUCOSAL | Status: DC

## 2024-01-09 MED ORDER — PROPOFOL 10 MG/ML IV BOLUS
INTRAVENOUS | Status: AC
  Filled 2024-01-09: qty 20

## 2024-01-09 MED ORDER — VANCOMYCIN HCL 1000 MG IV SOLR
INTRAVENOUS | Status: AC
  Filled 2024-01-09: qty 20

## 2024-01-09 MED ORDER — MIDAZOLAM HCL 2 MG/2ML IJ SOLN
INTRAMUSCULAR | Status: AC
  Filled 2024-01-09: qty 2

## 2024-01-09 MED ORDER — LACTATED RINGERS IV SOLN: INTRAVENOUS | Status: DC

## 2024-01-09 MED ORDER — CEFAZOLIN SODIUM-DEXTROSE 2-4 GM/100ML-% IV SOLN
2.0000 g | INTRAVENOUS | Status: AC
  Administered 2024-01-09: 2 g via INTRAVENOUS
  Filled 2024-01-09: qty 100

## 2024-01-09 MED ORDER — ROCURONIUM BROMIDE 10 MG/ML (PF) SYRINGE
PREFILLED_SYRINGE | INTRAVENOUS | Status: AC
  Filled 2024-01-09: qty 10

## 2024-01-09 MED ORDER — SUGAMMADEX SODIUM 200 MG/2ML IV SOLN
INTRAVENOUS | Status: DC | PRN
  Administered 2024-01-09: 200 mg via INTRAVENOUS

## 2024-01-09 MED ORDER — ACETAMINOPHEN 500 MG PO TABS
1000.0000 mg | ORAL_TABLET | Freq: Four times a day (QID) | ORAL | Status: AC
  Administered 2024-01-09 – 2024-01-10 (×4): 1000 mg via ORAL
  Filled 2024-01-09 (×4): qty 2

## 2024-01-09 MED ORDER — DEXAMETHASONE SODIUM PHOSPHATE 10 MG/ML IJ SOLN
INTRAMUSCULAR | Status: AC
  Filled 2024-01-09: qty 1

## 2024-01-09 MED ORDER — CELECOXIB 200 MG PO CAPS
200.0000 mg | ORAL_CAPSULE | Freq: Two times a day (BID) | ORAL | Status: DC
  Administered 2024-01-09 – 2024-01-11 (×4): 200 mg via ORAL
  Filled 2024-01-09 (×4): qty 1

## 2024-01-09 MED ORDER — FENTANYL CITRATE (PF) 100 MCG/2ML IJ SOLN
INTRAMUSCULAR | Status: AC
  Filled 2024-01-09: qty 2

## 2024-01-09 MED ORDER — SODIUM CHLORIDE 0.9% FLUSH
3.0000 mL | Freq: Two times a day (BID) | INTRAVENOUS | Status: DC
  Administered 2024-01-09 – 2024-01-11 (×4): 3 mL via INTRAVENOUS

## 2024-01-09 MED ORDER — MIDAZOLAM HCL 2 MG/2ML IJ SOLN
INTRAMUSCULAR | Status: DC | PRN
Start: 2024-01-09 — End: 2024-01-09
  Administered 2024-01-09: 2 mg via INTRAVENOUS

## 2024-01-09 MED ORDER — ONDANSETRON HCL 4 MG/2ML IJ SOLN
INTRAMUSCULAR | Status: DC | PRN
  Administered 2024-01-09: 4 mg via INTRAVENOUS

## 2024-01-09 MED ORDER — ONDANSETRON HCL 4 MG/2ML IJ SOLN
INTRAMUSCULAR | Status: AC
  Filled 2024-01-09: qty 2

## 2024-01-09 MED ORDER — ORAL CARE MOUTH RINSE: 15.0000 mL | Freq: Once | OROMUCOSAL | Status: AC

## 2024-01-09 MED ORDER — PHENYLEPHRINE HCL-NACL 20-0.9 MG/250ML-% IV SOLN
INTRAVENOUS | Status: DC | PRN
  Administered 2024-01-09: 40 ug/min via INTRAVENOUS

## 2024-01-09 MED ORDER — ALBUMIN HUMAN 5 % IV SOLN: INTRAVENOUS | Status: DC | PRN

## 2024-01-09 MED ORDER — BUPIVACAINE HCL (PF) 0.25 % IJ SOLN
INTRAMUSCULAR | Status: AC
  Filled 2024-01-09: qty 30

## 2024-01-09 MED ORDER — ACETAMINOPHEN 10 MG/ML IV SOLN
INTRAVENOUS | Status: AC
Start: 2024-01-09 — End: 2024-01-10
  Filled 2024-01-09: qty 100

## 2024-01-09 MED ORDER — OXYCODONE HCL 5 MG PO TABS
5.0000 mg | ORAL_TABLET | ORAL | Status: DC | PRN
  Administered 2024-01-10 – 2024-01-11 (×5): 5 mg via ORAL
  Filled 2024-01-09 (×5): qty 1

## 2024-01-09 MED ORDER — THROMBIN 5000 UNITS EX SOLR
OROMUCOSAL | Status: DC | PRN
  Administered 2024-01-09: 5 mL via TOPICAL

## 2024-01-09 MED ORDER — VANCOMYCIN HCL 1000 MG IV SOLR
INTRAVENOUS | Status: DC | PRN
Start: 2024-01-09 — End: 2024-01-09
  Administered 2024-01-09: 1000 mg via TOPICAL

## 2024-01-09 MED ORDER — DEXAMETHASONE SODIUM PHOSPHATE 10 MG/ML IJ SOLN
INTRAMUSCULAR | Status: DC | PRN
  Administered 2024-01-09: 5 mg via INTRAVENOUS

## 2024-01-09 MED ORDER — THROMBIN 20000 UNITS EX SOLR
CUTANEOUS | Status: DC | PRN
  Administered 2024-01-09: 20 mL via TOPICAL

## 2024-01-09 MED ORDER — PHENOL 1.4 % MT LIQD: 1.0000 | OROMUCOSAL | Status: DC | PRN

## 2024-01-09 MED ORDER — ACETAMINOPHEN 10 MG/ML IV SOLN
INTRAVENOUS | Status: DC | PRN
  Administered 2024-01-09: 1000 mg via INTRAVENOUS

## 2024-01-09 MED ORDER — LIDOCAINE 2% (20 MG/ML) 5 ML SYRINGE
INTRAMUSCULAR | Status: DC | PRN
  Administered 2024-01-09: 60 mg via INTRAVENOUS

## 2024-01-09 MED ORDER — VANCOMYCIN HCL 1250 MG/250ML IV SOLN
1250.0000 mg | INTRAVENOUS | Status: DC
  Administered 2024-01-09: 1250 mg via INTRAVENOUS
  Filled 2024-01-09: qty 250

## 2024-01-09 MED ORDER — BUPIVACAINE HCL (PF) 0.25 % IJ SOLN
INTRAMUSCULAR | Status: DC | PRN
  Administered 2024-01-09: 10 mL

## 2024-01-09 MED ORDER — POTASSIUM CHLORIDE IN NACL 20-0.9 MEQ/L-% IV SOLN
INTRAVENOUS | Status: AC
  Filled 2024-01-09: qty 1000

## 2024-01-09 MED ORDER — SODIUM CHLORIDE 0.9 % IV SOLN: 250.0000 mL | INTRAVENOUS | Status: AC

## 2024-01-09 MED ORDER — SODIUM CHLORIDE 0.9% FLUSH: 3.0000 mL | INTRAVENOUS | Status: DC | PRN

## 2024-01-09 MED ORDER — CHLORHEXIDINE GLUCONATE 0.12 % MT SOLN
OROMUCOSAL | Status: AC
  Filled 2024-01-09: qty 15

## 2024-01-09 MED ORDER — SODIUM CHLORIDE 0.9 % IV SOLN: 10.0000 mL/h | Freq: Once | INTRAVENOUS | Status: AC

## 2024-01-09 MED ORDER — PHENYLEPHRINE 80 MCG/ML (10ML) SYRINGE FOR IV PUSH (FOR BLOOD PRESSURE SUPPORT)
PREFILLED_SYRINGE | INTRAVENOUS | Status: AC
  Filled 2024-01-09: qty 10

## 2024-01-09 MED ORDER — PHENYLEPHRINE 80 MCG/ML (10ML) SYRINGE FOR IV PUSH (FOR BLOOD PRESSURE SUPPORT)
PREFILLED_SYRINGE | INTRAVENOUS | Status: DC | PRN
  Administered 2024-01-09 (×2): 80 ug via INTRAVENOUS
  Administered 2024-01-09: 160 ug via INTRAVENOUS
  Administered 2024-01-09: 80 ug via INTRAVENOUS

## 2024-01-09 MED ORDER — ROCURONIUM BROMIDE 10 MG/ML (PF) SYRINGE
PREFILLED_SYRINGE | INTRAVENOUS | Status: DC | PRN
  Administered 2024-01-09: 10 mg via INTRAVENOUS
  Administered 2024-01-09: 60 mg via INTRAVENOUS
  Administered 2024-01-09: 10 mg via INTRAVENOUS

## 2024-01-09 MED ORDER — METHOCARBAMOL 500 MG PO TABS
500.0000 mg | ORAL_TABLET | Freq: Four times a day (QID) | ORAL | Status: DC | PRN
  Administered 2024-01-10 – 2024-01-11 (×3): 500 mg via ORAL
  Filled 2024-01-09 (×3): qty 1

## 2024-01-09 MED ORDER — CHLORHEXIDINE GLUCONATE 0.12 % MT SOLN
15.0000 mL | Freq: Once | OROMUCOSAL | Status: AC
  Administered 2024-01-09: 15 mL via OROMUCOSAL
  Filled 2024-01-09: qty 15

## 2024-01-09 MED ORDER — FENTANYL CITRATE (PF) 100 MCG/2ML IJ SOLN
25.0000 ug | INTRAMUSCULAR | Status: DC | PRN
  Administered 2024-01-09 (×2): 50 ug via INTRAVENOUS

## 2024-01-09 MED ORDER — FENTANYL CITRATE (PF) 250 MCG/5ML IJ SOLN
INTRAMUSCULAR | Status: DC | PRN
  Administered 2024-01-09: 100 ug via INTRAVENOUS

## 2024-01-09 MED ORDER — ACETAMINOPHEN 10 MG/ML IV SOLN: 1000.0000 mg | Freq: Once | INTRAVENOUS | Status: DC | PRN

## 2024-01-09 MED ORDER — 0.9 % SODIUM CHLORIDE (POUR BTL) OPTIME
TOPICAL | Status: DC | PRN
  Administered 2024-01-09: 1000 mL

## 2024-01-09 MED ORDER — METHOCARBAMOL 1000 MG/10ML IJ SOLN: 500.0000 mg | Freq: Four times a day (QID) | INTRAMUSCULAR | Status: DC | PRN

## 2024-01-09 MED ORDER — DEXAMETHASONE 4 MG PO TABS
4.0000 mg | ORAL_TABLET | Freq: Four times a day (QID) | ORAL | Status: DC
  Administered 2024-01-09 – 2024-01-10 (×3): 4 mg via ORAL
  Filled 2024-01-09 (×3): qty 1

## 2024-01-09 MED ORDER — VASHE WOUND IRRIGATION OPTIME
TOPICAL | Status: DC | PRN
  Administered 2024-01-09: 34 [oz_av] via TOPICAL

## 2024-01-09 MED ORDER — LIDOCAINE 2% (20 MG/ML) 5 ML SYRINGE
INTRAMUSCULAR | Status: AC
  Filled 2024-01-09: qty 5

## 2024-01-09 MED ORDER — CHLORHEXIDINE GLUCONATE CLOTH 2 % EX PADS
6.0000 | MEDICATED_PAD | Freq: Once | CUTANEOUS | Status: AC
  Administered 2024-01-09: 6 via TOPICAL

## 2024-01-09 MED ORDER — THROMBIN 20000 UNITS EX SOLR
CUTANEOUS | Status: AC
  Filled 2024-01-09: qty 20000

## 2024-01-09 MED ORDER — FENTANYL CITRATE (PF) 250 MCG/5ML IJ SOLN
INTRAMUSCULAR | Status: AC
  Filled 2024-01-09: qty 5

## 2024-01-09 MED ORDER — ONDANSETRON HCL 4 MG PO TABS: 4.0000 mg | ORAL_TABLET | Freq: Four times a day (QID) | ORAL | Status: DC | PRN

## 2024-01-09 MED ORDER — PROPOFOL 10 MG/ML IV BOLUS
INTRAVENOUS | Status: DC | PRN
  Administered 2024-01-09: 100 mg via INTRAVENOUS

## 2024-01-09 MED ORDER — THROMBIN 5000 UNITS EX KIT
PACK | CUTANEOUS | Status: AC
  Filled 2024-01-09: qty 1

## 2024-01-09 MED ORDER — INSULIN ASPART 100 UNIT/ML IJ SOLN
0.0000 [IU] | INTRAMUSCULAR | Status: DC | PRN
  Administered 2024-01-09: 6 [IU] via SUBCUTANEOUS
  Filled 2024-01-09: qty 1

## 2024-01-09 MED ORDER — MENTHOL 3 MG MT LOZG: 1.0000 | LOZENGE | OROMUCOSAL | Status: DC | PRN

## 2024-01-09 MED ORDER — CHLORHEXIDINE GLUCONATE 0.12 % MT SOLN: 15.0000 mL | Freq: Once | OROMUCOSAL | Status: DC

## 2024-01-09 MED ORDER — SENNA 8.6 MG PO TABS
1.0000 | ORAL_TABLET | Freq: Two times a day (BID) | ORAL | Status: DC
  Administered 2024-01-09 – 2024-01-11 (×4): 8.6 mg via ORAL
  Filled 2024-01-09 (×4): qty 1

## 2024-01-09 MED ORDER — CHLORHEXIDINE GLUCONATE CLOTH 2 % EX PADS: 6.0000 | MEDICATED_PAD | Freq: Once | CUTANEOUS | Status: AC

## 2024-01-09 MED ORDER — DEXAMETHASONE SODIUM PHOSPHATE 4 MG/ML IJ SOLN: 4.0000 mg | Freq: Four times a day (QID) | INTRAMUSCULAR | Status: DC

## 2024-01-09 MED ORDER — CEFAZOLIN SODIUM-DEXTROSE 2-4 GM/100ML-% IV SOLN
INTRAVENOUS | Status: AC
  Filled 2024-01-09: qty 100

## 2024-01-09 MED ORDER — MORPHINE SULFATE (PF) 2 MG/ML IV SOLN
2.0000 mg | INTRAVENOUS | Status: DC | PRN
  Administered 2024-01-09 – 2024-01-10 (×2): 2 mg via INTRAVENOUS
  Filled 2024-01-09 (×2): qty 1

## 2024-01-09 MED ORDER — ONDANSETRON HCL 4 MG/2ML IJ SOLN: 4.0000 mg | Freq: Four times a day (QID) | INTRAMUSCULAR | Status: DC | PRN

## 2024-01-09 MED ORDER — ACETAMINOPHEN 10 MG/ML IV SOLN
INTRAVENOUS | Status: AC
  Filled 2024-01-09: qty 100

## 2024-01-09 SURGICAL SUPPLY — 56 items
ALLOGRAFT BONE FIBER KORE 5 (Bone Implant) IMPLANT
BAG COUNTER SPONGE SURGICOUNT (BAG) ×1 IMPLANT
BASKET BONE COLLECTION (BASKET) IMPLANT
BENZOIN TINCTURE PRP APPL 2/3 (GAUZE/BANDAGES/DRESSINGS) ×1 IMPLANT
BLADE BONE MILL MEDIUM (MISCELLANEOUS) IMPLANT
BLADE CLIPPER SURG (BLADE) IMPLANT
BUR CARBIDE MATCH 3.0 (BURR) ×1 IMPLANT
CANISTER SUCT 3000ML PPV (MISCELLANEOUS) ×1 IMPLANT
CLEANSER WND VASHE 34 (WOUND CARE) ×1 IMPLANT
CNTNR URN SCR LID CUP LEK RST (MISCELLANEOUS) ×1 IMPLANT
COVER BACK TABLE 60X90IN (DRAPES) ×1 IMPLANT
DRAPE C-ARM 42X72 X-RAY (DRAPES) IMPLANT
DRAPE LAPAROTOMY 100X72X124 (DRAPES) ×1 IMPLANT
DRAPE SURG 17X23 STRL (DRAPES) ×1 IMPLANT
DRESSING PEEL AND PLAC PRVNA20 (GAUZE/BANDAGES/DRESSINGS) IMPLANT
DRSG PEEL AND PLACE PREVENA 20 (GAUZE/BANDAGES/DRESSINGS) ×1 IMPLANT
DRSG TELFA 3X8 NADH STRL (GAUZE/BANDAGES/DRESSINGS) IMPLANT
DURAPREP 26ML APPLICATOR (WOUND CARE) ×1 IMPLANT
ELECT REM PT RETURN 9FT ADLT (ELECTROSURGICAL) ×1 IMPLANT
ELECTRODE REM PT RTRN 9FT ADLT (ELECTROSURGICAL) ×1 IMPLANT
EVACUATOR 1/8 PVC DRAIN (DRAIN) IMPLANT
GAUZE 4X4 16PLY ~~LOC~~+RFID DBL (SPONGE) IMPLANT
GLOVE BIO SURGEON STRL SZ7 (GLOVE) ×1 IMPLANT
GLOVE BIO SURGEON STRL SZ8 (GLOVE) ×2 IMPLANT
GLOVE BIOGEL PI IND STRL 7.0 (GLOVE) IMPLANT
GOWN STRL REUS W/ TWL LRG LVL3 (GOWN DISPOSABLE) IMPLANT
GOWN STRL REUS W/ TWL XL LVL3 (GOWN DISPOSABLE) ×2 IMPLANT
GOWN STRL REUS W/TWL 2XL LVL3 (GOWN DISPOSABLE) IMPLANT
GRAFT BN 10X1XDBM MAGNIFUSE (Bone Implant) IMPLANT
HEMOSTAT POWDER KIT SURGIFOAM (HEMOSTASIS) ×1 IMPLANT
KIT BASIN OR (CUSTOM PROCEDURE TRAY) ×1 IMPLANT
KIT DRSG PREVENA PLUS 7DAY 125 (MISCELLANEOUS) IMPLANT
KIT TURNOVER KIT B (KITS) ×1 IMPLANT
MILL BONE PREP (MISCELLANEOUS) IMPLANT
NDL HYPO 25X1 1.5 SAFETY (NEEDLE) ×1 IMPLANT
NEEDLE HYPO 25X1 1.5 SAFETY (NEEDLE) ×1 IMPLANT
NS IRRIG 1000ML POUR BTL (IV SOLUTION) ×1 IMPLANT
PACK LAMINECTOMY NEURO (CUSTOM PROCEDURE TRAY) ×1 IMPLANT
PAD ARMBOARD POSITIONER FOAM (MISCELLANEOUS) ×3 IMPLANT
POWDER MORCELSS FINE 2000MG (Miscellaneous) IMPLANT
PWDR MORCELSS FINE 2000MG (Miscellaneous) ×1 IMPLANT
ROD STRT INV 5.5X400 (Rod) IMPLANT
SCREW ILIAC PA 5.5X40 (Screw) IMPLANT
SCREW KODIAK 6.5X40 (Screw) IMPLANT
SCREW KODIAK 6.5X45 (Screw) IMPLANT
SET SCREW SPNE (Screw) IMPLANT
SPONGE SURGIFOAM ABS GEL 100 (HEMOSTASIS) ×1 IMPLANT
SPONGE T-LAP 4X18 ~~LOC~~+RFID (SPONGE) IMPLANT
STRIP CLOSURE SKIN 1/2X4 (GAUZE/BANDAGES/DRESSINGS) ×2 IMPLANT
SUT VIC AB 0 CT1 18XCR BRD8 (SUTURE) ×1 IMPLANT
SUT VIC AB 2-0 CP2 18 (SUTURE) ×1 IMPLANT
SUT VIC AB 3-0 SH 8-18 (SUTURE) ×2 IMPLANT
TOWEL GREEN STERILE (TOWEL DISPOSABLE) ×1 IMPLANT
TOWEL GREEN STERILE FF (TOWEL DISPOSABLE) ×1 IMPLANT
TRAY FOLEY MTR SLVR 16FR STAT (SET/KITS/TRAYS/PACK) IMPLANT
WATER STERILE IRR 1000ML POUR (IV SOLUTION) ×1 IMPLANT

## 2024-01-09 NOTE — Anesthesia Postprocedure Evaluation (Signed)
 Anesthesia Post Note  Patient: SAFIA PANZER  Procedure(s) Performed: Posterior lateral fusion - Thoracic Seven-Thoracic Eight - Thoracic Eight-Thoracic Nine - Thoracic Nine-Thoracic Ten, Thoracic Ten-Eleven, Thoracic Eleven-Twelve, Thoracic Twelve -Lumbar One, extension of fusion and removal of Thoracic Ten screws (Back)     Patient location during evaluation: Other Anesthesia Type: General Level of consciousness: awake and alert Pain management: pain level controlled Vital Signs Assessment: post-procedure vital signs reviewed and stable Respiratory status: spontaneous breathing, nonlabored ventilation, respiratory function stable and patient connected to nasal cannula oxygen Cardiovascular status: blood pressure returned to baseline and stable Postop Assessment: no apparent nausea or vomiting Anesthetic complications: no  No notable events documented.  Last Vitals:  Vitals:   01/09/24 1930 01/09/24 1948  BP: (!) 153/68 (!) 153/72  Pulse: 87 89  Resp: (!) 8 18  Temp:  36.4 C  SpO2: 97% 97%    Last Pain:  Vitals:   01/09/24 1948  TempSrc: Oral  PainSc: 8                  Gilberte Gorley,W. EDMOND

## 2024-01-09 NOTE — Op Note (Signed)
 01/09/2024  5:54 PM  PATIENT:  Katelyn [name]  68 y.o. 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 Decompressive thoracic laminectomy, medial facetectomy and foraminotomies T9-10 2.  Exploration of fusion T10-L2 with removal of segmental instrumentation, removal of loosened T10 pedicle screws, removal and replacement of bilateral L1 pedicle screws, and removal and replacement of left T12 pedicle screw 3. Posterior fixation T7-L1 (inclusive minus T10) using ATEC pedicle screws.  4. Intertransverse arthrodesis T7-L1 using morcellized autograft and allograft.  SURGEON:  Marikay Alar, MD  ASSISTANTS: Verlin Dike, FNP  ANESTHESIA:  General  EBL: 300 ml  Total I/O In: 1765 [I.V.:1000; Blood:315; IV Piggyback:450] Out: 1800 [Urine:1500; Blood:300]  BLOOD ADMINISTERED:none  DRAINS: Medium Hemovac  INDICATION FOR PROCEDURE: This patient presented with acute onset dense paraparesis. 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 The patient tried a reasonable attempt at conservative medical measures without relief. I recommended decompression and instrumented fusion to address the stenosis as well as the segmental  instability.  Patient understood the risks, benefits, and alternatives and potential outcomes and wished to proceed.  PROCEDURE DETAILS:  The patient was brought to the operating room. After induction of generalized endotracheal anesthesia the patient was rolled into the prone position on chest rolls and all pressure points were  padded. The patient's rash began to lumbar region was cleaned with a down scrub and then prepped with DuraPrep and draped in the usual sterile fashion. Anesthesia was injected and then a dorsal midline incision was made and carried down to the thoracic fascia. The fascia was opened and the paraspinous musculature was taken down in a subperiosteal fashion to expose the old instrumentation from T10-L2.  We also exposed the posterior elements from T7-T10. A self-retaining retractor was placed.  I remove the locking caps from the pedicle screws from T10-L2 and then remove the rods.  We explored the fusion and found the T10 pedicle screws to be loosened within the pedicles.  The bilateral L1 pedicle screws were loose and the left T12 pedicle screw was somewhat loose.  Intraoperative fluoroscopy confirmed my level, and I started with placement of the pedicle screws. The pedicle screw entry zones from T7-T9 were identified utilizing surface landmarks and  AP and lateral fluoroscopy. I scored the cortex with the high-speed drill and then used the hand drill to score the entry zone to the pedicle.  We used a pedicle probe to probe each pedicle T7-T9 bilaterally, palpated with a ball probe to assure no break in the cortex.  We then tapped with a 4.5 tap.  We palpated with a ball probe once again.  I then placed a 5.5 x 40 mm pedicle screws into the pedicles of T7-T9 bilaterally.  He also removed the bilateral L1 screws and the left T12 screw and replaced these utilizing lateral fluoroscopy.  We replaced them with 6.5 x 40 mm screws.   I then turned my attention to the decompression and complete laminectomies, medial- facetectomies, and foraminotomies were performed at T9-10.  My nurse practitioner was directly involved in the decompression and exposure of the neural elements. the patient had significant  spinal stenosis.  Drilled the bone down to an eggshell.  There was thickened scarlike material over the surface of the  dura but we did not see evidence of significant infection.  There was no pus.  This looks more like a chronic pseudoarthrosis or chronic scarring or inflammation than anything else.  The yellow ligament was removed to expose the underlying dura and nerve roots, and generous foraminotomies were performed to adequately decompress the neural elements.  We were very careful to gently decompress the canal and the lateral recess with a 2 mm gold Kerrison punch.  We did send tissue for culture and pathology and we also sent swabs.   We then decorticated the transverse processes and laid a mixture of morcellized autograft and allograft out over these to perform intertransverse arthrodesis at T7-L1. We then placed kyphotic rods into the multiaxial screw heads of the pedicle screws and locked these in position with the locking caps and anti-torque device. We then checked our construct with AP and lateral fluoroscopy. Irrigated with copious amounts of Vashe solution. Inspected the dura once again to assure adequate decompression, lined to the dura with Gelfoam, placed vancomycin powder into the wound and then we closed the muscle and the fascia with 0 Vicryl.  We then placed 2 g of myriad fine morsels into the subcutaneous space and a wound measuring about 20 cm.  Closed the subcutaneous tissues with 2-0 Vicryl and subcuticular tissues with 3-0 Vicryl.  Then placed a negative pressure wound dressing over the incision.  The patient was awakened from general anesthesia and transported to the recovery room in stable condition. At the end of the procedure all sponge, needle and instrument counts were correct.   PLAN OF CARE: admit to inpatient  PATIENT DISPOSITION:  PACU - hemodynamically stable.   Delay start of Pharmacological VTE agent (>24hrs) due to surgical blood loss or risk of bleeding:  yes

## 2024-01-09 NOTE — Progress Notes (Signed)
 Pharmacy Antibiotic Note  Katelyn [name] is a 68 y.o. female admitted on 01/07/2024 with  s/p spinal surgery, drain in place .  Pharmacy has been consulted for vancomycin dosing.  Plan: Vancomycin 1250mg  IV q48h (eAUC 515, SCr 1.55, Vd 0.5)  Check vancomycin levels as needed, goal AUC 400-550 Follow up renal function, cultures as available, clinical progress, length of tx  Height: 5\' 1"  (154.9 cm) Weight: 90.7 kg (200 lb) IBW/kg (Calculated) : 47.8  Temp (24hrs), Avg:98.1 F (36.7 C), Min:97.6 F (36.4 C), Max:98.6 F (37 C)  Recent Labs  Lab 01/07/24 1959  WBC 8.2  CREATININE 1.55*    Estimated Creatinine Clearance: 36.6 mL/min (A) (by C-G formula based on SCr of 1.55 mg/dL (H)).    Allergies  Allergen Reactions   Atorvastatin Nausea And Vomiting and Other (See Comments)    MYALGIAS   Talwin [Pentazocine] Other (See Comments)    headache   Cefepime Other (See Comments)    Presumed AMS/neurotoxicity in the setting of AKI   Gabapentin Swelling   Other Nausea Only    UNSPECIFIED Anesthesia    Antimicrobials this admission: Cefazolin pre-op 4/2 Vancomycin 4/2 >>  Dose adjustments this admission:  Microbiology results: 4/2 Intervertebral disc:  Thank you for allowing pharmacy to be a part of this patient's care.  Loralee Pacas, PharmD, BCPS 01/09/2024 8:16 PM

## 2024-01-09 NOTE — Progress Notes (Signed)
 Report received from [facility] RN

## 2024-01-09 NOTE — Anesthesia Procedure Notes (Signed)
 Procedure Name: Intubation Date/Time: 01/09/2024 2:37 PM  Performed by: Einar Grad, CRNAPre-anesthesia Checklist: Patient identified, Emergency Drugs available, Suction available and Patient being monitored Patient Re-evaluated:Patient Re-evaluated prior to induction Oxygen Delivery Method: Circle System Utilized Preoxygenation: Pre-oxygenation with 100% oxygen Induction Type: IV induction Ventilation: Mask ventilation without difficulty Laryngoscope Size: Mac and 3 Grade View: Grade II Tube type: Oral Number of attempts: 1 Airway Equipment and Method: Stylet and Oral airway Placement Confirmation: ETT inserted through vocal cords under direct vision, positive ETCO2 and breath sounds checked- equal and bilateral Secured at: 22 cm Tube secured with: Tape Dental Injury: Teeth and Oropharynx as per pre-operative assessment

## 2024-01-09 NOTE — Plan of Care (Signed)
  Problem: Education: Goal: Ability to describe self-care measures that may prevent or decrease complications (Diabetes Survival Skills Education) will improve Outcome: Progressing Goal: Individualized Educational Video(s) Outcome: Progressing   Problem: Coping: Goal: Ability to adjust to condition or change in health will improve Outcome: Progressing   Problem: Fluid Volume: Goal: Ability to maintain a balanced intake and output will improve Outcome: Progressing   Problem: Health Behavior/Discharge Planning: Goal: Ability to identify and utilize available resources and services will improve Outcome: Progressing Goal: Ability to manage health-related needs will improve Outcome: Progressing   Problem: Metabolic: Goal: Ability to maintain appropriate glucose levels will improve Outcome: Progressing   Problem: Nutritional: Goal: Maintenance of adequate nutrition will improve Outcome: Progressing Goal: Progress toward achieving an optimal weight will improve Outcome: Progressing   Problem: Skin Integrity: Goal: Risk for impaired skin integrity will decrease Outcome: Progressing   Problem: Tissue Perfusion: Goal: Adequacy of tissue perfusion will improve Outcome: Progressing   Problem: Education: Goal: Knowledge of General Education information will improve Description: Including pain rating scale, medication(s)/side effects and non-pharmacologic comfort measures Outcome: Progressing   Problem: Health Behavior/Discharge Planning: Goal: Ability to manage health-related needs will improve Outcome: Progressing   Problem: Clinical Measurements: Goal: Ability to maintain clinical measurements within normal limits will improve Outcome: Progressing Goal: Will remain free from infection Outcome: Progressing Goal: Diagnostic test results will improve Outcome: Progressing Goal: Respiratory complications will improve Outcome: Progressing Goal: Cardiovascular complication will  be avoided Outcome: Progressing   Problem: Activity: Goal: Risk for activity intolerance will decrease Outcome: Progressing   Problem: Nutrition: Goal: Adequate nutrition will be maintained Outcome: Progressing   Problem: Coping: Goal: Level of anxiety will decrease Outcome: Progressing   Problem: Elimination: Goal: Will not experience complications related to bowel motility Outcome: Progressing Goal: Will not experience complications related to urinary retention Outcome: Progressing   Problem: Pain Managment: Goal: General experience of comfort will improve and/or be controlled Outcome: Progressing   Problem: Safety: Goal: Ability to remain free from injury will improve Outcome: Progressing   Problem: Skin Integrity: Goal: Risk for impaired skin integrity will decrease Outcome: Progressing   Problem: Education: Goal: Ability to verbalize activity precautions or restrictions will improve Outcome: Progressing Goal: Knowledge of the prescribed therapeutic regimen will improve Outcome: Progressing Goal: Understanding of discharge needs will improve Outcome: Progressing   Problem: Activity: Goal: Ability to avoid complications of mobility impairment will improve Outcome: Progressing Goal: Ability to tolerate increased activity will improve Outcome: Progressing Goal: Will remain free from falls Outcome: Progressing   Problem: Bowel/Gastric: Goal: Gastrointestinal status for postoperative course will improve Outcome: Progressing   Problem: Clinical Measurements: Goal: Ability to maintain clinical measurements within normal limits will improve Outcome: Progressing Goal: Postoperative complications will be avoided or minimized Outcome: Progressing Goal: Diagnostic test results will improve Outcome: Progressing   Problem: Pain Management: Goal: Pain level will decrease Outcome: Progressing   Problem: Skin Integrity: Goal: Will show signs of wound  healing Outcome: Progressing   Problem: Health Behavior/Discharge Planning: Goal: Identification of resources available to assist in meeting health care needs will improve Outcome: Progressing   Problem: Bladder/Genitourinary: Goal: Urinary functional status for postoperative course will improve Outcome: Progressing

## 2024-01-09 NOTE — H&P (Signed)
  Patient is in the holding area awaiting surgery.  She has stenosis at T9-10 with sequelae of discitis T9-10.  She has weakness in her legs.  I am recommending a T9-10 decompressive laminectomy with extension of instrumentation up to T7 with posterolateral arthrodesis T7-T12.  We we will remove the loose pedicle screws at T10.  We have discussed the risks and benefits extensively.  She wishes to proceed.

## 2024-01-09 NOTE — Progress Notes (Signed)
 Pt received from PACU, vitals stable. Pt has long  dressing with prevena clean, dry, intact  pt also has a hemovac. Reviewed transfer orders saw that pt had order for 2 units RBCs, pt has received 1 unit per pacu RN . Reached out for clarity per PACU nurse pt is to only receive 1 unit RBC and no recheck of hgb is needed at this time. Husband at bedside , pt has no concerns at this time. Call bell within reach with bed alarm set.   01/09/24 1948  Vitals  Temp 97.6 F (36.4 C)  Temp Source Oral  BP (!) 153/72  MAP (mmHg) 96  BP Location Left Arm  BP Method Automatic  Patient Position (if appropriate) Lying  Pulse Rate 89  Pulse Rate Source Dinamap  Resp 18  Level of Consciousness  Level of Consciousness Alert  MEWS COLOR  MEWS Score Color Green  Oxygen Therapy  SpO2 97 %  O2 Device Nasal Cannula  O2 Flow Rate (L/min) 2 L/min  Pain Assessment  Pain Scale 0-10  Pain Score 8  Pain Type Acute pain;Surgical pain  Pain Location Back  Pain Descriptors / Indicators Sore  Pain Onset On-going  Patients Stated Pain Goal 4  MEWS Score  MEWS Temp 0  MEWS Systolic 0  MEWS Pulse 0  MEWS RR 0  MEWS LOC 0  MEWS Score 0

## 2024-01-09 NOTE — Progress Notes (Deleted)
 Cardiology Office Note:  .   Date:  01/09/2024  ID:  Katelyn [name], DOB 1956-01-01, MRN [id number] PCP: Olive Bass, MD  [facility] Providers Cardiologist:  Norman Herrlich, MD    History of Present Illness: .   Katelyn [name] is a 68 y.o. female with a past medical history of nonobstructive CAD LHC 2021, hypertension, LBBB, chronic diastolic heart failure, PAD, pulmonary hypertension, NSVT, OSA, restrictive lung disease, GERD, DM 2, CKD stage III, thrombocytopenia, hyperlipidemia, cirrhosis.  She was admitted to [facility] ([facility] on 03/13/2023 with altered mental status and found to be in acute renal failure creatinine elevated to 4.06, BUN 95.  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  She was hyponatremic although this had been somewhat of an ongoing issue for her for some time.  EF revealed mild LVH, EF 40 to 45%, elevated LA and LV end-diastolic pressures.  She presents today accompanied by her husband for follow-up of her coronary artery disease and hypertension.  From a cardiac perspective, she is doing well.  She does have some pedal edema however she has been struggling with her diet over the summer and contributes this to increased sodium intake.  She was recently evaluated by GI for liver biopsy, she is awaiting the results of that. She denies chest pain, palpitations, dyspnea, pnd, orthopnea, n, v, dizziness, syncope, or early satiety.   ROS: Review of Systems  Constitutional: Negative.   HENT: Negative.    Eyes: Negative.   Respiratory: Negative.    Cardiovascular:  Positive for leg swelling. Negative for chest pain, palpitations, orthopnea and PND.  Gastrointestinal: Negative.   Genitourinary: Negative.   Musculoskeletal:  Positive for back pain.  Skin: Negative.   Neurological: Negative.    Endo/Heme/Allergies: Negative.   Psychiatric/Behavioral: Negative.       Studies Reviewed: .        Cardiac Studies & Procedures   ______________________________________________________________________________________________ CARDIAC CATHETERIZATION  CARDIAC CATHETERIZATION 10/05/2020  Narrative  Prox LAD lesion is 30% stenosed.  1st Diag lesion is 55% stenosed.  Dist Cx lesion is 40% stenosed.  Prox RCA lesion is 25% stenosed.  Dist RCA lesion is 25% stenosed.  There is mild left ventricular systolic dysfunction.  LV end diastolic pressure is moderately elevated.  The left ventricular ejection fraction is 50-55% by visual estimate.  Hemodynamic findings consistent with mild pulmonary hypertension.  1. Nonobstructive CAD. Codominant circulation 2. Low normal LV systolic function. EF estimated at 50%. 3. Elevated LV filling pressures - PCWP 22 mm Hg, EDP 25 mm Hg 4. Mild pulmonary HTN with mean PAP 27 mm Hg 5. Elevated RA pressures 16 mm Hg 6. Reduced cardiac output with index 2.5  Plan: medical management for CAD. Need to repeat Echo to assess LV and RV function. May need to consider pulmonary embolic disease given pulmonary HTN and elevated RA pressures. This may reflect elevated Left heart pressures. Morbid obesity and OSA may also be contributing.  Findings Coronary Findings Diagnostic  Dominance: Co-dominant  Left Main Vessel was injected. Vessel is normal in caliber. Vessel is angiographically normal.  Left Anterior Descending Prox LAD lesion is 30% stenosed.  First Diagonal Branch 1st Diag lesion is 55% stenosed.  Left Circumflex Dist Cx lesion is 40% stenosed.  Right Coronary Artery Prox RCA lesion is 25% stenosed. Dist RCA lesion is  25% stenosed.  Intervention  No interventions have been documented.   STRESS TESTS  MYOCARDIAL PERFUSION IMAGING 04/09/2019  Narrative  The left ventricular ejection fraction is normal (55-65%).  Nuclear  stress EF: 56%.  There was no ST segment deviation noted during stress.  The study is normal.  This is a low risk study.   ECHOCARDIOGRAM  ECHOCARDIOGRAM COMPLETE 10/06/2020  Narrative ECHOCARDIOGRAM REPORT    Patient Name:   Katelyn [name] Date of Exam: 10/06/2020 Medical Rec #:  [id number]   Height:       62.0 in Accession #:    [id number]  Weight:       222.4 lb Date of Birth:  December 20, 1955   BSA:          2.000 m Patient Age:    64 years    BP:           145/73 mmHg Patient Gender: F           HR:           77 bpm. Exam Location:  Inpatient  Procedure: 2D Echo, 3D Echo, Color Doppler, Cardiac Doppler and Strain Analysis  Indications:    Pulmonary hypertension 416.8 / I27.2  History:        Patient has prior history of Echocardiogram examinations, most recent 04/28/2019. CAD, Arrythmias:LBBB; Risk Factors:Sleep Apnea, Hypertension, Diabetes and Dyslipidemia. GERD.  Sonographer:    Leta Jungling RDCS Referring Phys: [phone] PETER M [place]  IMPRESSIONS   1. Left ventricular ejection fraction, by estimation, is 55 to 60%. The left ventricle has normal function. The left ventricle has no regional wall motion abnormalities. There is mild left ventricular hypertrophy. Left ventricular diastolic parameters are indeterminate. 2. Right ventricular systolic function is normal. The right ventricular size is normal. Tricuspid regurgitation signal is inadequate for assessing PA pressure. 3. The mitral valve is normal in structure. No evidence of mitral valve regurgitation. No evidence of mitral stenosis. 4. The aortic valve is tricuspid. Aortic valve regurgitation is not visualized. No aortic stenosis is present.  FINDINGS Left Ventricle: Left ventricular ejection fraction, by estimation, is 55 to 60%. The left ventricle has normal function. The left ventricle has no regional wall motion abnormalities. The left ventricular internal cavity size was normal in size. There is mild left  ventricular hypertrophy. Left ventricular diastolic parameters are indeterminate.  Right Ventricle: The right ventricular size is normal. No increase in right ventricular wall thickness. Right ventricular systolic function is normal. Tricuspid regurgitation signal is inadequate for assessing PA pressure. The tricuspid regurgitant velocity is 1.34 m/s, and with an assumed right atrial pressure of 3 mmHg, the estimated right ventricular systolic pressure is 10.2 mmHg.  Left Atrium: Left atrial size was normal in size.  Right Atrium: Right atrial size was normal in size.  Pericardium: There is no evidence of pericardial effusion.  Mitral Valve: The mitral valve is normal in structure. No evidence of mitral valve regurgitation. No evidence of mitral valve stenosis.  Tricuspid Valve: The tricuspid valve is normal in structure. Tricuspid valve regurgitation is trivial.  Aortic Valve: The aortic valve is tricuspid. Aortic valve regurgitation is not visualized. No aortic stenosis is present.  Pulmonic Valve: The pulmonic valve was not well visualized. Pulmonic valve regurgitation is not visualized.  Aorta: The aortic root and ascending aorta are structurally normal, with no evidence of dilitation.  IAS/Shunts: The interatrial septum was not well visualized.   LEFT VENTRICLE PLAX 2D LVIDd:  4.80 cm      Diastology LVIDs:         3.20 cm      LV e' medial:    6.65 cm/s LV PW:         0.90 cm      LV E/e' medial:  14.7 LV IVS:        1.00 cm      LV e' lateral:   7.38 cm/s LVOT diam:     2.00 cm      LV E/e' lateral: 13.2 LV SV:         72 LV SV Index:   36 LVOT Area:     3.14 cm  3D Volume EF: LV Volumes (MOD)            3D EF:        58 % LV vol d, MOD A2C: 93.1 ml  LV EDV:       174 ml LV vol d, MOD A4C: 120.0 ml LV ESV:       73 ml LV vol s, MOD A2C: 33.6 ml  LV SV:        101 ml LV vol s, MOD A4C: 49.0 ml LV SV MOD A2C:     59.5 ml LV SV MOD A4C:     120.0 ml LV SV MOD  BP:      64.5 ml  RIGHT VENTRICLE RV S prime:     13.50 cm/s TAPSE (M-mode): 3.0 cm  LEFT ATRIUM             Index       RIGHT ATRIUM          Index LA diam:        4.10 cm 2.05 cm/m  RA Area:     9.25 cm LA Vol (A2C):   47.0 ml 23.50 ml/m RA Volume:   14.40 ml 7.20 ml/m LA Vol (A4C):   50.8 ml 25.40 ml/m LA Biplane Vol: 51.1 ml 25.55 ml/m AORTIC VALVE LVOT Vmax:   112.00 cm/s LVOT Vmean:  79.200 cm/s LVOT VTI:    0.229 m  AORTA Ao Root diam: 2.70 cm Ao Asc diam:  2.90 cm  MITRAL VALVE               TRICUSPID VALVE MV Area (PHT): 4.68 cm    TR Peak grad:   7.2 mmHg MV Decel Time: 162 msec    TR Vmax:        134.00 cm/s MV E velocity: 97.70 cm/s MV A velocity: 75.80 cm/s  SHUNTS MV E/A ratio:  1.29        Systemic VTI:  0.23 m Systemic Diam: 2.00 cm  Katelyn Lesches MD Electronically signed by Katelyn Lesches MD Signature Date/Time: 10/06/2020/5:17:16 PM    Final          ______________________________________________________________________________________________      Risk Assessment/Calculations:             Physical Exam:   VS:  There were no vitals taken for this visit.   Wt Readings from Last 3 Encounters:  01/07/24 200 lb (90.7 kg)  01/01/24 200 lb (90.7 kg)  11/06/23 194 lb (88 kg)    GEN: Well nourished, well developed in no acute distress NECK: No JVD; No carotid bruits CARDIAC: RRR, no murmurs, rubs, gallops RESPIRATORY:  Clear to auscultation without rales, wheezing or rhonchi  ABDOMEN: Soft, non-tender, non-distended EXTREMITIES:  +1 pitting edema; No deformity  ASSESSMENT AND PLAN: .   CAD-nonobstructive per LHC in 2021, Stable with no anginal symptoms. No indication for ischemic evaluation.  Continue aspirin 81 mg daily.  Continue Coreg 6.25 mg twice daily, continue nitroglycerin as needed--has not needed  HFmrEF - EF 45-50% during most recent hospitalization, NYHA class II, euvolemic. Weight is up ~ 10 lbs, but she  attributes this to increased caloric intake, I do not appreciate any signs of overt volume overload. Continue Torsemide 20 mg daily. Continue Coreg 6.25 mg twice daily.  Currently on Farxiga 10 mg daily.  Other GDMT prohibited by kidney dysfunction. She follows with nephrology. Will check ProBNP.   CKD -most recent creatinine 1.45, GFR 40.  Careful titration with antihypertensives and diuretics.  Follows with nephrology.   HLD -most recent LDL elevated at 104, she is statin intolerant, has undefined type of hepatitis. Previously referred to PharmD for PCSK9i consideration.     Dispo: Return in 6 months, proBNP.  Signed, Flossie Dibble, NP

## 2024-01-09 NOTE — Transfer of Care (Signed)
 Immediate Anesthesia Transfer of Care Note  Patient: Katelyn [name]  Procedure(s) Performed: Posterior lateral fusion - Thoracic Seven-Thoracic Eight - Thoracic Eight-Thoracic Nine - Thoracic Nine-Thoracic Ten, Thoracic Ten-Eleven, Thoracic Eleven-Twelve, Thoracic Twelve -Lumbar One, extension of fusion and removal of Thoracic Ten screws (Back)  Patient Location: PACU  Anesthesia Type:General  Level of Consciousness: awake and alert   Airway & Oxygen Therapy: Patient Spontanous Breathing and Patient connected to face mask oxygen  Post-op Assessment: Report given to RN and Post -op Vital signs reviewed and stable  Post vital signs: Reviewed and stable  Last Vitals:  Vitals Value Taken Time  BP 151/69 01/09/24 1815  Temp    Pulse 89 01/09/24 1819  Resp 11 01/09/24 1819  SpO2 91 % 01/09/24 1819  Vitals shown include unfiled device data.  Last Pain:  Vitals:   01/09/24 1227  TempSrc:   PainSc: 5      Pt moving upper and lower extremities, right leg significantly weaker as per preop    Complications: No notable events documented.

## 2024-01-10 ENCOUNTER — Ambulatory Visit: Admitting: Cardiology

## 2024-01-10 DIAGNOSIS — M4714 Other spondylosis with myelopathy, thoracic region: Secondary | ICD-10-CM | POA: Diagnosis not present

## 2024-01-10 LAB — GLUCOSE, CAPILLARY
Glucose-Capillary: 318 mg/dL — ABNORMAL HIGH (ref 70–99)
Glucose-Capillary: 368 mg/dL — ABNORMAL HIGH (ref 70–99)
Glucose-Capillary: 319 mg/dL — ABNORMAL HIGH (ref 70–99)
Glucose-Capillary: 342 mg/dL — ABNORMAL HIGH (ref 70–99)

## 2024-01-10 LAB — CREATININE, SERUM
Creatinine, Ser: 1.33 mg/dL — ABNORMAL HIGH (ref 0.44–1.00)
GFR, Estimated: 44 mL/min — ABNORMAL LOW (ref >60)

## 2024-01-10 MED ORDER — DAPTOMYCIN-SODIUM CHLORIDE 500-0.9 MG/50ML-% IV SOLN
8.0000 mg/kg | Freq: Every day | INTRAVENOUS | Status: DC
  Administered 2024-01-10 – 2024-01-11 (×2): 500 mg via INTRAVENOUS
  Filled 2024-01-10 (×2): qty 50

## 2024-01-10 MED ORDER — CHLORHEXIDINE GLUCONATE CLOTH 2 % EX PADS
6.0000 | MEDICATED_PAD | Freq: Every day | CUTANEOUS | Status: DC
  Administered 2024-01-10 – 2024-01-11 (×2): 6 via TOPICAL

## 2024-01-10 MED ORDER — INSULIN GLARGINE-YFGN 100 UNIT/ML ~~LOC~~ SOLN
20.0000 [IU] | Freq: Every day | SUBCUTANEOUS | Status: DC
  Administered 2024-01-10 – 2024-01-11 (×2): 20 [IU] via SUBCUTANEOUS
  Filled 2024-01-10 (×2): qty 0.2

## 2024-01-10 MED ORDER — SODIUM CHLORIDE 0.9 % IV SOLN
1.0000 g | INTRAVENOUS | Status: DC
  Filled 2024-01-10 (×2): qty 1000

## 2024-01-10 MED ORDER — INSULIN ASPART 100 UNIT/ML IJ SOLN
0.0000 [IU] | Freq: Three times a day (TID) | INTRAMUSCULAR | Status: DC
  Administered 2024-01-10: 15 [IU] via SUBCUTANEOUS

## 2024-01-10 MED ORDER — INSULIN ASPART 100 UNIT/ML IJ SOLN
0.0000 [IU] | Freq: Every day | INTRAMUSCULAR | Status: DC
  Administered 2024-01-10: 4 [IU] via SUBCUTANEOUS

## 2024-01-10 MED ORDER — INSULIN ASPART 100 UNIT/ML IJ SOLN
0.0000 [IU] | Freq: Three times a day (TID) | INTRAMUSCULAR | Status: DC
  Administered 2024-01-11 (×3): 7 [IU] via SUBCUTANEOUS

## 2024-01-10 NOTE — Evaluation (Signed)
 Physical Therapy Evaluation Patient Details Name: Katelyn [name] MRN: [id number] DOB: 23-Aug-1956 Today's Date: 01/10/2024  History of Present Illness  68 yo female presents to [facility] on 3/31 with LE weakness, foot drop. Myelogram 4/1 shows significant stenosis of the T10 level where she has sequelae of discitis. S/p T7-L1 fusion, removal of T10 screws on 4/2.  PMH:  T11-12, T10-L2 posterior fixation and removal of spinal stimulator 12/24; multiple lumbar surgeries, anxiety, chronic low back pain with multiple lumbar surgeries,  DM2, obesity, OSA, LBBB, HTN, hypercholesterolemia, CKD, CAD.  Clinical Impression   Pt presents with LE weakness R>L, impaired sitting and standing balance, impaired LE sensation, and decreased activity tolerance. Pt to benefit from acute PT to address deficits. Pt requiring up to mod +2 for mobility this date, tolerating bed mobility and transition into standing but unable to progress away from EOB given weakness and fatigue. Patient will benefit from intensive inpatient follow-up therapy, >3 hours/day. PT to progress mobility as tolerated, and will continue to follow acutely.          If plan is discharge home, recommend the following: A lot of help with walking and/or transfers;A lot of help with bathing/dressing/bathroom;Assistance with cooking/housework;Assist for transportation;Help with stairs or ramp for entrance   Can travel by private vehicle        Equipment Recommendations None recommended by PT  Recommendations for Other Services       Functional Status Assessment Patient has had a recent decline in their functional status and demonstrates the ability to make significant improvements in function in a reasonable and predictable amount of time.     Precautions / Restrictions Precautions Precautions: Fall Recall of Precautions/Restrictions: Impaired Required Braces or Orthoses: Spinal Brace Spinal Brace: Thoracolumbosacral orthotic;Applied in sitting  position Restrictions Weight Bearing Restrictions Per Provider Order: No      Mobility  Bed Mobility Overal bed mobility: Needs Assistance Bed Mobility: Rolling, Sidelying to Sit, Sit to Sidelying Rolling: Mod assist Sidelying to sit: Mod assist, +2 for physical assistance     Sit to sidelying: Mod assist, +2 for physical assistance General bed mobility comments: assist for trunk and LE management, use of log roll technique to perform    Transfers Overall transfer level: Needs assistance Equipment used: Rolling walker (2 wheels) Transfers: Sit to/from Stand Sit to Stand: Min assist, +2 physical assistance, From elevated surface           General transfer comment: assist for completion of rise with facilitation through sacrum, pt with good initiation of power up. Locks RLE out into extension to prevent buckling    Ambulation/Gait                  Stairs            Wheelchair Mobility     Tilt Bed    Modified Rankin (Stroke Patients Only)       Balance Overall balance assessment: Needs assistance Sitting-balance support: Bilateral upper extremity supported, Feet supported Sitting balance-Leahy Scale: Poor Sitting balance - Comments: fair to poor, with posterior bias with fatigue Postural control: Posterior lean Standing balance support: Bilateral upper extremity supported, Reliant on assistive device for balance Standing balance-Leahy Scale: Poor                               Pertinent Vitals/Pain Pain Assessment Pain Assessment: Faces Faces Pain Scale: Hurts even more Pain Location: back Pain Descriptors /  Indicators: Guarding, Operative site guarding, Discomfort Pain Intervention(s): Limited activity within patient's tolerance, Monitored during session, Repositioned    Home Living Family/patient expects to be discharged to:: Private residence Living Arrangements: Spouse/significant other Available Help at Discharge:  Family;Available 24 hours/day Type of Home: House Home Access: Ramped entrance       Home Layout: One level Home Equipment: Agricultural consultant (2 wheels);Rollator (4 wheels);Wheelchair - manual;Shower seat;Grab bars - tub/shower;BSC/3in1;Hand held shower head;Adaptive equipment;Other (comment)      Prior Function Prior Level of Function : Needs assist             Mobility Comments: using wc more recently secondary to progressive LE weakness, husband assist with transfers. ADLs Comments: Husband assist with transfers & LB ADLs     Extremity/Trunk Assessment   Upper Extremity Assessment Upper Extremity Assessment: Defer to OT evaluation RUE Deficits / Details: Per pt previous R shoulder surgery RUE Sensation: WNL RUE Coordination:  (WFL)    Lower Extremity Assessment Lower Extremity Assessment: LLE deficits/detail;RLE deficits/detail RLE Deficits / Details: 3+/5 knee extension, 3/5 DF/PF. hip flexion mostly achieved throughout PF and knee flexion in standing, but + contraction RLE Sensation: decreased light touch (distally) LLE Deficits / Details: 4/5 knee extension, at least 3/5 DF/PF LLE Sensation: decreased light touch (distally)    Cervical / Trunk Assessment Cervical / Trunk Assessment: Kyphotic;Back Surgery  Communication   Communication Communication: No apparent difficulties    Cognition Arousal: Alert Behavior During Therapy: WFL for tasks assessed/performed                           PT - Cognition Comments: tangential in conversation Following commands: Intact       Cueing Cueing Techniques: Verbal cues, Tactile cues     General Comments General comments (skin integrity, edema, etc.): wound vac and hemovac at incision    Exercises     Assessment/Plan    PT Assessment Patient needs continued PT services  PT Problem List Decreased strength;Decreased mobility;Decreased activity tolerance;Decreased balance;Decreased safety  awareness;Decreased knowledge of precautions;Obesity;Decreased knowledge of use of DME;Pain       PT Treatment Interventions DME instruction;Therapeutic activities;Gait training;Therapeutic exercise;Patient/family education;Functional mobility training;Neuromuscular re-education;Balance training    PT Goals (Current goals can be found in the Care Plan section)  Acute Rehab PT Goals Patient Stated Goal: AIR PT Goal Formulation: With patient/family Time For Goal Achievement: 01/24/24 Potential to Achieve Goals: Good    Frequency Min 5X/week     Co-evaluation               AM-PAC PT "6 Clicks" Mobility  Outcome Measure Help needed turning from your back to your side while in a flat bed without using bedrails?: A Lot Help needed moving from lying on your back to sitting on the side of a flat bed without using bedrails?: A Lot Help needed moving to and from a bed to a chair (including a wheelchair)?: A Lot Help needed standing up from a chair using your arms (e.g., wheelchair or bedside chair)?: A Lot Help needed to walk in hospital room?: Total Help needed climbing 3-5 steps with a railing? : Total 6 Click Score: 10    End of Session Equipment Utilized During Treatment: Back brace Activity Tolerance: Patient tolerated treatment well;Patient limited by fatigue Patient left: in bed;with call bell/phone within reach;with bed alarm set;with family/visitor present Nurse Communication: Mobility status PT Visit Diagnosis: Other abnormalities of gait and mobility (R26.89);Muscle weakness (  generalized) (M62.81)    Time: 2956-2130 PT Time Calculation (min) (ACUTE ONLY): 26 min   Charges:   PT Evaluation $PT Eval Low Complexity: 1 Low PT Treatments $Therapeutic Activity: 8-22 mins PT General Charges $$ ACUTE PT VISIT: 1 Visit         Marye Round, PT DPT Acute Rehabilitation Services Secure Chat Preferred  Office [phone]   Khayri Kargbo E Stroup 01/10/2024, 2:12 PM

## 2024-01-10 NOTE — PMR Pre-admission (Signed)
 PMR Admission Coordinator Pre-Admission Assessment  Patient: Katelyn [name] is an 68 y.o., female MRN: [id number] DOB: May 08, 1956 Height: 5\' 1"  (154.9 cm) Weight: 90.7 kg              Insurance Information HMO:  yes   PPO:     PCP:      IPA:      80/20:      OTHER:  PRIMARY: [employer] PPO      Policy#: [id number]      Subscriber: pt CM Name: Babette Relic      Phone#: [phone]     Fax#: epic access Pre-Cert#: [id number] auth for CIR from Tammy with [employer] with updates due weekly, they have epic access.       Employer:  Benefits:  Phone #: [phone]     Name:  Eff. Date: 10/10/23     Deduct: $0      Out of Pocket Max: $3500 (met $225.67)      Life Max: n/a  CIR: $225/day for days 1-6      SNF: 20 full days Outpatient:      Co-Pay: $15/visit Home Health: 80%      Co-Pay: 20% DME: 80%     Co-Pay: 20% Providers:  SECONDARY:       Policy#:       Phone#:   Artist:       Phone#:   The Data processing manager" for patients in Inpatient Rehabilitation Facilities with attached "Privacy Act Statement-Health Care Records" was provided and verbally reviewed with: Patient and Family  Emergency Contact Information Contact Information     Name Relation Home Work Mobile   Felkins,Jerry Spouse   [phone]      Other Contacts   None on File    Current Medical History  Patient Admitting Diagnosis: thoracic myelopathy   History of Present Illness: Pt is a 68 y/o female with PMH of multiple spinal surgeries, DM, obesity, OSA, LBBB, HTN, HLD, CKD, and CAD admitted to [facility] per neurosurgery MD recommendation on 01/07/24 due to R foot drop and LE weakness starting 01/03/24.  Endorses burning sensation in her thoracic area, denies N/T in the legs.  Initial motor exam demos BLE strength no greater than 2/5.  Pt underwent multiple CT scans and  CT myelogram which showed significant stenosis of the T10 level where she has sequelae of discitis.  Neurosurgery recommended surgical  intervention and she underwent a decompressive thoracic laminectomy, medial facetectomy, and foraminotomies of T9-10, removal/re-do hardware T10-L1, and posterior fixation of T7-L1 per Dr. Yetta Barre on 01/09/24.  Consult to ID given prior postop infection with Serratia marcescens and discitis/osteomyelitis with epidural phlegmon with IV abx.  Hospital course urinary retention, constipation, ABLA, and supplemental O2 needs.  Therapy evaluations completed and pt was recommended for CIR due to functional decline as well as bowel/bladder needs.    Glasgow Coma Scale Score: 14  Patient's medical record from [facility] has been reviewed by the rehabilitation admission coordinator and physician.  Past Medical History  Past Medical History:  Diagnosis Date   Anxiety    Arthritis    Bursitis of right hip    CAD (coronary artery disease)    Cardiac catheterization June 2014 in [place] - 50% circumflex stenosis   Chest pain, neg MI, stable CAD non obstructive on cath 10/05/20 10/04/2020   Chronic diastolic heart failure (HCC) 08/20/2017   Chronic kidney disease, stage 3 (HCC)    does not see nephrologist  Chronic low back pain without sciatica 03/14/2016   Cirrhosis of liver (HCC)    CKD (chronic kidney disease), stage III (HCC) 10/07/2020   Complication of anesthesia    Cough 04/19/2017   Overview:  Last Assessment & Plan:  Formatting of this note may be different from the original. Cough - ? ACE related with AR triggers   Plan  Patient Instructions  Discuss with your primary doctor that lisinopril pain, need making your cough worse. May use Mucinex DM twice daily as needed for cough and congestion Zyrtec 10 mg at bedtime as needed for drainage Saline nasal spray as needed. Lab tests today Activity as tolerated. Follow with Dr. Craige Cotta in 3-4 months and As needed   Please contact office for sooner follow up if symptoms do not improve or worsen or seek emergency care    Depression    Dyspnea    with  exertion   " lazy lung" - per  Dr Craige Cotta from back issues- 06/2016   Elevated liver enzymes 12/05/2016   Essential hypertension    GERD (gastroesophageal reflux disease)    Gout 03/14/2016   Greater trochanteric bursitis of right hip 02/02/2012   H/O hiatal hernia    Heart murmur    History of blood transfusion 2016   History of esophageal stricture 10/07/2020   History of kidney stones    Hypercholesterolemia    Hypertensive heart disease with heart failure (HCC) 01/01/2017   Hypoxia 10/07/2020   Iliotibial band syndrome of right side 02/02/2012   Iron deficiency anemia due to chronic blood loss 03/14/2016   LBBB (left bundle branch block) 01/01/2017   Left bundle branch block    Leg weakness 10/07/2020   Lumbar stenosis    Meralgia paraesthetica 12/05/2016   Mild CAD 11/24/2015   Morbid obesity (HCC) 10/07/2020   Neuropathy    OSA (obstructive sleep apnea) 05/24/2016   Overview:  Managed PULM- no CPAP   PONV (postoperative nausea and vomiting)    "no N/V with patch"   Restless leg syndrome    S/P lumbar laminectomy 11/26/2015   S/P lumbar spinal fusion 08/29/2016   Tinnitus 12/05/2016   Type 2 diabetes mellitus (HCC)    UTI (urinary tract infection) 10/07/2020    Has the patient had major surgery during 100 days prior to admission? Yes  Family History  family history includes Asthma in her sister; Bone cancer in her sister; Heart failure in her mother; Hypertension in her father; Lung cancer in her father; Stroke in her father.   Current Medications   Current Facility-Administered Medications:    0.9 %  sodium chloride infusion, 250 mL, Intravenous, Continuous, Arman Bogus, MD   0.9 % NaCl with KCl 20 mEq/ L  infusion, , Intravenous, Continuous, Yetta Barre Thomes Dinning, MD, Stopped at 01/10/24 [phone]   acetaminophen (TYLENOL) tablet 1,000 mg, 1,000 mg, Oral, Q6H, Arman Bogus, MD, 1,000 mg at 01/10/24 1141   celecoxib (CELEBREX) capsule 200 mg, 200 mg, Oral, Q12H,  Arman Bogus, MD, 200 mg at 01/10/24 9604   Chlorhexidine Gluconate Cloth 2 % PADS 6 each, 6 each, Topical, Daily, Arman Bogus, MD, 6 each at 01/10/24 1405   ciprofloxacin (CIPRO) tablet 500 mg, 500 mg, Oral, BID, Arman Bogus, MD, 500 mg at 01/10/24 0912   doxycycline (VIBRA-TABS) tablet 100 mg, 100 mg, Oral, BID, Arman Bogus, MD, 100 mg at 01/10/24 0912   DULoxetine (CYMBALTA) DR capsule 60 mg, 60 mg, Oral, QHS, Jones,  Thomes Dinning, MD, 60 mg at 01/09/24 2226   insulin aspart (novoLOG) injection 0-20 Units, 0-20 Units, Subcutaneous, TID WC, Bergman, Meghan D, NP   insulin glargine-yfgn (SEMGLEE) injection 20 Units, 20 Units, Subcutaneous, Daily, Bergman, Meghan D, NP   menthol-cetylpyridinium (CEPACOL) lozenge 3 mg, 1 lozenge, Oral, PRN **OR** phenol (CHLORASEPTIC) mouth spray 1 spray, 1 spray, Mouth/Throat, PRN, Arman Bogus, MD   methocarbamol (ROBAXIN) tablet 500 mg, 500 mg, Oral, Q6H PRN, 500 mg at 01/10/24 0018 **OR** methocarbamol (ROBAXIN) injection 500 mg, 500 mg, Intravenous, Q6H PRN, Arman Bogus, MD   mirtazapine (REMERON) tablet 30 mg, 30 mg, Oral, QHS, Arman Bogus, MD, 30 mg at 01/09/24 2225   morphine (PF) 2 MG/ML injection 2 mg, 2 mg, Intravenous, Q2H PRN, Arman Bogus, MD, 2 mg at 01/10/24 0017   multivitamin with minerals tablet 1 tablet, 1 tablet, Oral, Daily, Arman Bogus, MD, 1 tablet at 01/10/24 0912   ondansetron (ZOFRAN) tablet 4 mg, 4 mg, Oral, Q6H PRN **OR** ondansetron (ZOFRAN) injection 4 mg, 4 mg, Intravenous, Q6H PRN, Arman Bogus, MD   oxyCODONE (Oxy IR/ROXICODONE) immediate release tablet 5 mg, 5 mg, Oral, Q3H PRN, Arman Bogus, MD, 5 mg at 01/10/24 1150   pramipexole (MIRAPEX) tablet 1 mg, 1 mg, Oral, QHS, Arman Bogus, MD, 1 mg at 01/09/24 2226   pregabalin (LYRICA) capsule 75 mg, 75 mg, Oral, BID, Arman Bogus, MD, 75 mg at 01/10/24 2595   senna (SENOKOT) tablet 8.6 mg, 1 tablet, Oral,  BID, Arman Bogus, MD, 8.6 mg at 01/10/24 0912   sodium chloride flush (NS) 0.9 % injection 3 mL, 3 mL, Intravenous, Q12H, Arman Bogus, MD, 3 mL at 01/09/24 2227   sodium chloride flush (NS) 0.9 % injection 3 mL, 3 mL, Intravenous, PRN, Arman Bogus, MD   torsemide [facility]) tablet 40 mg, 40 mg, Oral, Daily, Arman Bogus, MD, 40 mg at 01/10/24 6387  Patients Current Diet:  Diet Order             Diet Carb Modified Fluid consistency: Thin; Room service appropriate? Yes  Diet effective now                   Precautions / Restrictions Precautions Precautions: Fall Spinal Brace: Thoracolumbosacral orthotic, Applied in sitting position Restrictions Weight Bearing Restrictions Per Provider Order: No   Has the patient had 2 or more falls or a fall with injury in the past year?Yes  Prior Activity Level Limited Community (1-2x/wk): mod I with RW prior to Friday 3/28, able to access her community with bilat platform RW, RW in the home, able to garden, and complete mobility and adls without assist (confirmed with pt and spouse)  Prior Functional Level Prior Function Prior Level of Function : Needs assist Mobility Comments: using a w/c since last Friday 2/2 increasing LE weakness ADLs Comments: Husband assist with transfers & LB ADLs  Self Care: Did the patient need help bathing, dressing, using the toilet or eating?  Independent  Indoor Mobility: Did the patient need assistance with walking from room to room (with or without device)? Independent  Stairs: Did the patient need assistance with internal or external stairs (with or without device)? Needed some help  Functional Cognition: Did the patient need help planning regular tasks such as shopping or remembering to take medications? Independent  Patient Information Are you of Hispanic, Latino/a,or Spanish origin?: A. No, not of Hispanic, Latino/a, or [place]  origin What is your race?: A. White Do you need  or want an interpreter to communicate with a doctor or health care staff?: 0. No  Patient's Response To:  Health Literacy and Transportation Is the patient able to respond to health literacy and transportation needs?: Yes Health Literacy - How often do you need to have someone help you when you read instructions, pamphlets, or other written material from your doctor or pharmacy?: Never In the past 12 months, has lack of transportation kept you from medical appointments or from getting medications?: No In the past 12 months, has lack of transportation kept you from meetings, work, or from getting things needed for daily living?: No  Home Assistive Devices / Equipment Home Equipment: Agricultural consultant (2 wheels), Rollator (4 wheels), Wheelchair - manual, Shower seat, Grab bars - tub/shower, BSC/3in1, Hand held shower head, Adaptive equipment, Other (comment)  Prior Device Use: Indicate devices/aids used by the patient prior to current illness, exacerbation or injury? Walker  Current Functional Level Cognition  Orientation Level: Oriented to situation, Disoriented to place    Extremity Assessment (includes Sensation/Coordination)  Upper Extremity Assessment: Defer to OT evaluation RUE Deficits / Details: Per pt previous R shoulder surgery RUE Sensation: WNL RUE Coordination:  (WFL)  Lower Extremity Assessment: LLE deficits/detail, RLE deficits/detail RLE Deficits / Details: 3+/5 knee extension, 3/5 DF/PF. hip flexion mostly achieved throughout PF and knee flexion in standing, but + contraction RLE Sensation: decreased light touch (distally) LLE Deficits / Details: 4/5 knee extension, at least 3/5 DF/PF LLE Sensation: decreased light touch (distally)    ADLs  Overall ADL's : Needs assistance/impaired Eating/Feeding: Set up, Bed level Grooming: Set up, Bed level Upper Body Bathing: Set up, Bed level Lower Body Bathing: Total assistance, Bed level Upper Body Dressing : Set up, Bed  level Lower Body Dressing: Total assistance, Sitting/lateral leans Lower Body Dressing Details (indicate cue type and reason): Per pt assist at baseline    Mobility  Overal bed mobility: Needs Assistance Bed Mobility: Rolling, Sidelying to Sit, Sit to Sidelying Rolling: Mod assist Sidelying to sit: Mod assist, +2 for physical assistance Supine to sit: Max assist, HOB elevated, Used rails Sit to supine: Max assist, HOB elevated, Used rails Sit to sidelying: Mod assist, +2 for physical assistance General bed mobility comments: assist for trunk and LE management, use of log roll technique to perform    Transfers  Overall transfer level: Needs assistance Equipment used: Rolling walker (2 wheels) Transfers: Sit to/from Stand Sit to Stand: Min assist, +2 physical assistance, From elevated surface General transfer comment: assist for completion of rise with facilitation through sacrum, pt with good initiation of power up. Locks RLE out into extension to prevent buckling    Ambulation / Gait / Stairs / Engineer, drilling / Balance Dynamic Sitting Balance Sitting balance - Comments: fair to poor, with posterior bias with fatigue Balance Overall balance assessment: Needs assistance Sitting-balance support: Bilateral upper extremity supported, Feet supported Sitting balance-Leahy Scale: Poor Sitting balance - Comments: fair to poor, with posterior bias with fatigue Postural control: Posterior lean Standing balance support: Bilateral upper extremity supported, Reliant on assistive device for balance Standing balance-Leahy Scale: Poor    Special needs/care consideration Oxygen acutely, 2L [place], Skin surgical incision to back, and Diabetic management yes     Previous Home Environment (from acute therapy documentation) Living Arrangements: Spouse/significant other Available Help at Discharge: Family, Available 24 hours/day Type of Home: House Home Layout:  One level Home  Access: Ramped entrance Bathroom Shower/Tub: Health visitor: Handicapped height Bathroom Accessibility: Yes How Accessible: Accessible via walker Home Care Services: No  Discharge Living Setting Plans for Discharge Living Setting: Patient's home, Lives with (comment) (spouse) Type of Home at Discharge: House Discharge Home Layout: One level Discharge Home Access: Ramped entrance Discharge Bathroom Shower/Tub: Walk-in shower Discharge Bathroom Toilet: Handicapped height Discharge Bathroom Accessibility: Yes How Accessible: Accessible via walker Does the patient have any problems obtaining your medications?: No  Social/Family/Support Systems Patient Roles: Spouse Anticipated Caregiver: Macaria Bias (spouse) Anticipated Caregiver's Contact Information: [phone] Ability/Limitations of Caregiver: none stated, can provide 24/7 if needed Caregiver Availability: 24/7 Discharge Plan Discussed with Primary Caregiver: Yes Is Caregiver In Agreement with Plan?: Yes   Goals Patient/Family Goal for Rehab: PT/OT supervision to min A, SLP n/a Expected length of stay: 9-12 days Additional Information: Discharge plan: discharge home with spouse available 24/7. Pt/Family Agrees to Admission and willing to participate: Yes Program Orientation Provided & Reviewed with Pt/Caregiver Including Roles  & Responsibilities: Yes   Decrease burden of Care through IP rehab admission: n/a   Possible need for SNF placement upon discharge: Not anticipated.  Plan for discharge home with 24/7 support from her spouse.    Patient Condition: This patient's condition remains as documented in the consult dated 01/10/24, in which the Rehabilitation Physician determined and documented that the patient's condition is appropriate for intensive rehabilitative care in an inpatient rehabilitation facility. Will admit to inpatient rehab today.  Preadmission Screen Completed By:  Stephania Fragmin, PT, DPT  01/10/2024 4:05 PM ______________________________________________________________________   Discussed status with Dr. Shearon Stalls on 01/11/24 at 2:07 PM  and received approval for admission today.  Admission Coordinator:  Stephania Fragmin, PT, DPT time2:07 [place] Stare Dorna Bloom 01/11/24

## 2024-01-10 NOTE — Consult Note (Addendum)
 I have seen and examined the patient. I have personally reviewed the clinical findings, laboratory findings, microbiological data and imaging studies. The assessment and treatment plan was discussed with the Nurse Practitioner. I agree with her/his recommendations except following additions/corrections.  68 year old female with prior postop infection with Serratia marcescens in 2017, followed by T11-T12 discitis/osteomyelitis with epidural phlegmon with spinal cord stimulator in place, with negative cultures 11/2022 s/p course of vancomycin and cefepime complicated with AKI due to vancomycin, switched later to Ciprofloxacin and doxycycline but worsening pain after stimulator program changed with MRI showing progressive discitis/osteomyelitis s/p thoracic laminectomy with medial facetectomy T11-T12 and posterior fixation T12-L2, instrumented posterior fixation and removal of [place] stimulator ( cx NG) discharged on empiric daptomycin and cefepime but later cefepime changed to ertapenem in the setting of AMS and acute renal failure> PO doxycycline and ciprofloxacin suppression who was electively admitted in the setting of acute onset paraparesis. No prior fevers, chills, malaise, nausea, vomiting. Compliant with OP doxy/cipro.   S/p decompressive thoracic laminectomy, medial facetectomy, exploration of fusion t10-l2 with removal of segmental instrumentation, removal of loosened t10 pedicle screws and replacement of b/l l1 pedicle screws, removal and replacement of of left t12 pedicle screw. Per OR note, "no evidence of significant infection. There was no pus. This looks more like a chronic pseudoarthrosis or chronic scarring or inflammation than anything else. ". Or cx no organisms on gram stain and no growth to date  Exam -lumbar back wound, covered with a bandage C/B/I with a Hemovac drain.  She attempted to lift both legs although still difficult ( unable to do before) and with 4+ /5 dorsiflexion and  plantarflexion bilaterally  CT T spine severe thecal sac attenuation at the T9-10 level by soft tissue density material, likely epidural phlegmon/abscess. 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  Plan DC ciprofloxacin and doxycycline Start daptomycin and ertapenem pending cultures  Fu OR cx to completion Monitor CBC, CMP and CPK Universal/standard isolation precautions  Following   I have personally spent 82 minutes involved in face-to-face and non-face-to-face activities for this patient on the day of the visit. Professional time spent includes the following activities: Preparing to see the patient (review of tests), Obtaining and/or reviewing separately obtained history (admission/discharge record), Performing a medically appropriate examination and/or evaluation , Ordering medications/tests/procedures, referring and communicating with other health care professionals, Documenting clinical information in the EMR, Independently interpreting results (not separately reported), Communicating results to the patient/family/caregiver, Counseling and educating the patient/family/caregiver and Care coordination (not separately reported).        [facility]    Date of Admission:  01/07/2024     Total days of antibiotics                Reason for Consult: Discitis/osteomyelitis complicated by hardware   Referring Provider: Dr. Yetta Barre Primary Care Provider: Olive Bass, MD   ASSESSMENT:  Katelyn [name] is a 68 y/o caucasian female with prior postoperative infection with Serratia marcescens and recent T11/T12 discitis/osteomyelitis with epidural phlegmon on ciprofloxacin and doxycycline for suppression presenting with dropfoot and lower extremity weakness and found to have loosening of left T12 bilateral L1 pedicle screws.  Postop day #1 from decompressive thoracic laminectomy and removal and replacement  of bilateral L1 pedicle screws and posterior fixation of T7-L1 with no evidence of infection found during surgery.  Cultures obtained without growth to date. Continue monitoring of renal function  with GFR of 44 consistent with chronic kidney disease stage III.  Will adjust medications as needed for renal function.  Continues to have hardware from previous surgeries.   Postoperative wound care per neurosurgery.  Continue universal/standard precautions.  Remaining medical and supportive care per neurosurgery.  PLAN:  Monitor cultures for any new organisms. Change antibiotics to daptomycin and ertapenem  Postoperative wound care per neurosurgery. Continue standard/universal precautions. Remaining medical and supportive care per primary team.   Principal Problem:   Thoracic spondylosis with myelopathy Active Problems:   CKD stage 3b, GFR 30-44 ml/min (HCC)    acetaminophen  1,000 mg Oral Q6H   celecoxib  200 mg Oral Q12H   Chlorhexidine Gluconate Cloth  6 each Topical Daily   ciprofloxacin  500 mg Oral BID   doxycycline  100 mg Oral BID   DULoxetine  60 mg Oral QHS   insulin aspart  0-20 Units Subcutaneous TID WC   insulin glargine-yfgn  20 Units Subcutaneous Daily   mirtazapine  30 mg Oral QHS   multivitamin with minerals  1 tablet Oral Daily   pramipexole  1 mg Oral QHS   pregabalin  75 mg Oral BID   senna  1 tablet Oral BID   sodium chloride flush  3 mL Intravenous Q12H   torsemide  40 mg Oral Daily     HPI: Katelyn [name] is a 68 y.o. female with previous medical history of coronary artery disease, chronic kidney disease stage III, liver cirrhosis, and culture-negative discitis/osteomyelitis on suppressive antibiotics with doxycycline and ciprofloxacin presenting to the ED with dropfoot and difficulty using her legs.  Katelyn [name] is well-known to the ID service having last been seen on 11/06/2023 with good adherence and tolerance to doxycycline and ciprofloxacin for suppression of  culture-negative discitis/osteomyelitis following IV course of ertapenem and daptomycin.  Now presenting with dropfoot and lower extremity weakness and found to have loosening of left T12 bilateral L1 pedicle screws.  No fevers, chills, or leukocytosis.  Was having difficulty walking and getting out of bed.  Taking ciprofloxacin and doxycycline as prescribed with no adverse side effects at home.  Review of Systems: Review of Systems  Constitutional:  Negative for chills, fever and weight loss.  Respiratory:  Negative for cough, shortness of breath and wheezing.   Cardiovascular:  Negative for chest pain and leg swelling.  Gastrointestinal:  Negative for abdominal pain, constipation, diarrhea, nausea and vomiting.  Musculoskeletal:  Positive for back pain.  Skin:  Negative for rash.     Past Medical History:  Diagnosis Date   Anxiety    Arthritis    Bursitis of right hip    CAD (coronary artery disease)    Cardiac catheterization June 2014 in [place] - 50% circumflex stenosis   Chest pain, neg MI, stable CAD non obstructive on cath 10/05/20 10/04/2020   Chronic diastolic heart failure (HCC) 08/20/2017   Chronic kidney disease, stage 3 (HCC)    does not see nephrologist   Chronic low back pain without sciatica 03/14/2016   Cirrhosis of liver (HCC)    CKD (chronic kidney disease), stage III (HCC) 10/07/2020   Complication of anesthesia    Cough 04/19/2017   Overview:  Last Assessment & Plan:  Formatting of this note may be different from the original. Cough - ? ACE related with AR triggers   Plan  Patient Instructions  Discuss with your primary doctor that lisinopril pain, need making your cough worse. May use Mucinex DM  twice daily as needed for cough and congestion Zyrtec 10 mg at bedtime as needed for drainage Saline nasal spray as needed. Lab tests today Activity as tolerated. Follow with Dr. Craige Cotta in 3-4 months and As needed   Please contact office for sooner follow up if symptoms do  not improve or worsen or seek emergency care    Depression    Dyspnea    with exertion   " lazy lung" - per  Dr Craige Cotta from back issues- 06/2016   Elevated liver enzymes 12/05/2016   Essential hypertension    GERD (gastroesophageal reflux disease)    Gout 03/14/2016   Greater trochanteric bursitis of right hip 02/02/2012   H/O hiatal hernia    Heart murmur    History of blood transfusion 2016   History of esophageal stricture 10/07/2020   History of kidney stones    Hypercholesterolemia    Hypertensive heart disease with heart failure (HCC) 01/01/2017   Hypoxia 10/07/2020   Iliotibial band syndrome of right side 02/02/2012   Iron deficiency anemia due to chronic blood loss 03/14/2016   LBBB (left bundle branch block) 01/01/2017   Left bundle branch block    Leg weakness 10/07/2020   Lumbar stenosis    Meralgia paraesthetica 12/05/2016   Mild CAD 11/24/2015   Morbid obesity (HCC) 10/07/2020   Neuropathy    OSA (obstructive sleep apnea) 05/24/2016   Overview:  Managed PULM- no CPAP   PONV (postoperative nausea and vomiting)    "no N/V with patch"   Restless leg syndrome    S/P lumbar laminectomy 11/26/2015   S/P lumbar spinal fusion 08/29/2016   Tinnitus 12/05/2016   Type 2 diabetes mellitus (HCC)    UTI (urinary tract infection) 10/07/2020   Past Surgical History:  Procedure Laterality Date   ABDOMINAL HYSTERECTOMY  1983   APPENDECTOMY  Age 60   BACK SURGERY     FIRST LUMBAR FUSION/ SURGERY APRIL 2012 AND FUSION WITH INSTRUMENTATION SEPT 2012   CARDIAC CATHETERIZATION     x 2   CARPAL TUNNEL RELEASE     bil   CHOLECYSTECTOMY  1990's   COLONOSCOPY  12/12/2017   Colonic polyp status post polypectomy. Mild sigmoid diverticulosis. Otherwise normal colonoscopy to terminal ileum.   CYSTO EXTRACTION KIDNEY STONES     ESOPHAGOGASTRODUODENOSCOPY  12/12/2017   Small hiatal hernia. Mild gastritis. Status post esophageal dilatation.   EXCISION/RELEASE BURSA HIP  02/02/2012    Procedure: EXCISION/RELEASE BURSA HIP;  Surgeon: Jacki Cones, MD;  Location: [facility] ORS;  Service: Orthopedics;  Laterality: Right;  Right Hip Bursectomy   EYE SURGERY Bilateral    cataracts   IR THORACIC DISC ASPIRATION W/IMG GUIDE  02/14/2023   KIDNEY STONE SURGERY  2008   LAMINECTOMY WITH POSTERIOR LATERAL ARTHRODESIS LEVEL 4 N/A 02/19/2023   Procedure: THORACIC TEN-LUMBAR TWO INSTRUMENTED FUSION;  Surgeon: Tia Alert, MD;  Location: [facility] OR;  Service: Neurosurgery;  Laterality: N/A;   Left knee surgery x 2  1996   reconstruction   LUMBAR DISC SURGERY  08/2016   LUMBAR LAMINECTOMY/DECOMPRESSION MICRODISCECTOMY Right 11/26/2015   Procedure: Extraforaminal Microdiscectomy  - Lumbar two-three- right;  Surgeon: Tia Alert, MD;  Location: [facility];  Service: Neurosurgery;  Laterality: Right;  right    LUMBAR WOUND DEBRIDEMENT N/A 10/25/2016   Procedure: Lumbar wound revision;  Surgeon: Tia Alert, MD;  Location: [facility] OR;  Service: Neurosurgery;  Laterality: N/A;  Lumbar wound revision   Right shoulder surgery  2010   spur   RIGHT/LEFT HEART CATH AND CORONARY ANGIOGRAPHY N/A 10/05/2020   Procedure: RIGHT/LEFT HEART CATH AND CORONARY ANGIOGRAPHY;  Surgeon: [place], Peter M, MD;  Location: [facility] INVASIVE CV LAB;  Service: Cardiovascular;  Laterality: N/A;   SPINAL CORD STIMULATOR REMOVAL  02/19/2023   Procedure: LUMBAR SPINAL CORD STIMULATOR REMOVAL;  Surgeon: Tia Alert, MD;  Location: [facility] OR;  Service: Neurosurgery;;    Social History   Tobacco Use   Smoking status: Never   Smokeless tobacco: Never   Tobacco comments:    Prior secondhand smoke  Vaping Use   Vaping status: Never Used  Substance Use Topics   Alcohol use: Not Currently   Drug use: No    Family History  Problem Relation Age of Onset   Lung cancer Father        smoked   Hypertension Father    Stroke Father    Heart failure Mother    Bone cancer Sister    Asthma Sister     Allergies  Allergen Reactions    Atorvastatin Nausea And Vomiting and Other (See Comments)    MYALGIAS   Talwin [Pentazocine] Other (See Comments)    headache   Cefepime Other (See Comments)    Presumed AMS/neurotoxicity in the setting of AKI   Gabapentin Swelling   Other Nausea Only    UNSPECIFIED Anesthesia    OBJECTIVE: Blood pressure (!) 146/67, pulse 91, temperature 98.5 F (36.9 C), temperature source Oral, resp. rate 18, height 5\' 1"  (1.549 m), weight 90.7 kg, SpO2 98%.  Physical Exam Constitutional:      General: She is not in acute distress.    Appearance: She is well-developed.  Cardiovascular:     Rate and Rhythm: Normal rate and regular rhythm.     Heart sounds: Normal heart sounds.  Pulmonary:     Effort: Pulmonary effort is normal.     Breath sounds: Normal breath sounds.  Musculoskeletal:     Comments: Hemovac drain in place and charged  Skin:    General: Skin is warm and dry.  Neurological:     Mental Status: She is alert and oriented to person, place, and time.  Psychiatric:        Mood and Affect: Mood normal.     Lab Results Lab Results  Component Value Date   WBC 8.2 01/07/2024   HGB 8.8 (L) 01/09/2024   HCT 26.0 (L) 01/09/2024   MCV 89.9 01/07/2024   PLT 191 01/07/2024    Lab Results  Component Value Date   CREATININE 1.33 (H) 01/10/2024   BUN 41 (H) 01/07/2024   NA 136 01/09/2024   K 4.4 01/09/2024   CL 99 01/07/2024   CO2 28 01/07/2024    Lab Results  Component Value Date   ALT 55 (H) 01/01/2024   AST 48 (H) 01/01/2024   ALKPHOS 234 (H) 01/01/2024   BILITOT 0.5 01/01/2024     Microbiology: Recent Results (from the past 240 hours)  Surgical pcr screen     Status: None   Collection Time: 01/01/24 10:30 AM   Specimen: Nasal Mucosa; Nasal Swab  Result Value Ref Range Status   MRSA, PCR NEGATIVE NEGATIVE Final   Staphylococcus aureus NEGATIVE NEGATIVE Final    Comment: (NOTE) The Xpert SA Assay (FDA approved for NASAL specimens in patients 59 years of age  and older), is one component of a comprehensive surveillance program. It is not intended to diagnose infection nor to guide or  monitor treatment. Performed at [facility], [address], [place], [place] [address]   MRSA Next Gen by PCR, Nasal     Status: None   Collection Time: 01/09/24 11:35 AM   Specimen: Nasal Mucosa; Nasal Swab  Result Value Ref Range Status   MRSA by PCR Next Gen NOT DETECTED NOT DETECTED Final    Comment: (NOTE) The GeneXpert MRSA Assay (FDA approved for NASAL specimens only), is one component of a comprehensive MRSA colonization surveillance program. It is not intended to diagnose MRSA infection nor to guide or monitor treatment for MRSA infections. Test performance is not FDA approved in patients less than 73 years old. Performed at [facility], [address] [place], [place] [address]   Aerobic/Anaerobic Culture w Gram Stain (surgical/deep wound)     Status: None (Preliminary result)   Collection Time: 01/09/24  4:34 PM   Specimen: Intervertebral Disc; Tissue  Result Value Ref Range Status   Specimen Description WOUND  Final   Special Requests INTERVERTERAL DISC 9,10 PT ON ANCEF  Final   Gram Stain NO WBC SEEN NO ORGANISMS SEEN   Final   Culture   Final    NO GROWTH < 12 HOURS Performed at [facility], [address], [place], [place] [address]    Report Status PENDING  Incomplete  Aerobic/Anaerobic Culture w Gram Stain (surgical/deep wound)     Status: None (Preliminary result)   Collection Time: 01/09/24  4:44 PM   Specimen: Intervertebral Disc; Tissue  Result Value Ref Range Status   Specimen Description WOUND  Final   Special Requests INTERVERTEBRAL DISC 9,10 SWAB PT ON ANCEF  Final   Gram Stain NO WBC SEEN NO ORGANISMS SEEN   Final   Culture   Final    NO GROWTH < 12 HOURS Performed at [facility], [address], [place], [place] [address]    Report Status PENDING  Incomplete   Imaging DG Thoracic Spine  2 View Result Date: 01/09/2024 CLINICAL DATA:  Elective surgery. EXAM: THORACIC SPINE 2 VIEWS COMPARISON:  Preoperative imaging FINDINGS: Five fluoroscopic spot views from the operating room submitted. Portions of thoracolumbar fusion hardware visualized, now extending cranially into the T7 level. Fluoroscopy time 74.5 seconds. Dose 26.05 mGy. Portions of a spinal stimulator are seen. IMPRESSION: Intraoperative fluoroscopy during thoracic spine surgery. Electronically Signed   By: Narda Rutherford M.D.   On: 01/09/2024 18:20   DG C-Arm 1-60 Min-No Report Result Date: 01/09/2024 Fluoroscopy was utilized by the requesting physician.  No radiographic interpretation.   DG C-Arm 1-60 Min-No Report Result Date: 01/09/2024 Fluoroscopy was utilized by the requesting physician.  No radiographic interpretation.   DG C-Arm 1-60 Min-No Report Result Date: 01/09/2024 Fluoroscopy was utilized by the requesting physician.  No radiographic interpretation.   CT THORACIC SPINE W CONTRAST Result Date: 01/08/2024 CLINICAL DATA:  Mid back pain.  Myelogram.  Osteomyelitis. EXAM: CT THORACIC SPINE WITH CONTRAST TECHNIQUE: Multidetector CT images of thoracic was performed according to the standard protocol following intrathecal contrast administration. RADIATION DOSE REDUCTION: This exam was performed according to the departmental dose-optimization program which includes automated exposure control, adjustment of the mA and/or kV according to patient size and/or use of iterative reconstruction technique. CONTRAST:  10mL OMNIPAQUE IOHEXOL 300 MG/ML  SOLN COMPARISON:  Thoracic spine CT 01/08/2024, 01/07/2024 FINDINGS: Alignment: Unchanged grade 1 retrolisthesis at T11-12. Vertebrae: Erosive endplate changes at T9-10 and T11-12, unchanged. Lucency about the T10 transpedicular screws,  greater on the left. Paraspinal and other soft tissues: Edema within the paraspinal soft tissues at T9-10. Disc levels: The thecal sac is severely  attenuated at the T9-10 level by soft tissue density material, likely epidural phlegmon/abscess. Thecal sac caliber is normal below the T9-10 level. There is right asymmetric clumping of the nerve roots within the upper lumbar spinal canal. IMPRESSION: 1. Severe thecal sac attenuation at the T9-10 level by soft tissue density material, likely epidural phlegmon/abscess. 2. 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 3. Unchanged endplate erosion at T9-10 and T11-12 Electronically Signed   By: Deatra Robinson M.D.   On: 01/08/2024 20:43   CT THORACIC SPINE WO CONTRAST Result Date: 01/08/2024 CLINICAL DATA:  Mid back pain. 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 Previous thoracolumbar fusion and thoracic spinal stimulator placement. EXAM: CT THORACIC SPINE WITHOUT CONTRAST TECHNIQUE: Multidetector CT images of the thoracic were obtained using the standard protocol without intravenous contrast. RADIATION DOSE REDUCTION: This exam was performed according to the departmental dose-optimization program which includes automated exposure control, adjustment of the mA and/or kV according to patient size and/or use of iterative reconstruction technique. COMPARISON:  CT thoracic spine 01/07/2024, 12/19/2023 and 02/14/2023. FINDINGS: Alignment: Stable chronic retrolisthesis at T11-12. Vertebrae: Status post extensive thoracolumbar fusion extending inferiorly from T10. There is loosening of the T10 pedicle screws, greater on the left. No hardware displacement identified. The interconnecting rods are intact. Irregular endplate destruction at T9-10 again noted consistent with discitis/osteomyelitis. Sequela of remote discitis and osteomyelitis at T11-12 appears unchanged. There is chronic ankylosis across the L1-2 disc. Spinal stimulator remains in place at T6-7. Paraspinal and other soft tissues: Unchanged paraspinal inflammatory changes at T9-10 without focal fluid  collection. Disc levels: Unchanged multilevel thoracic spondylosis with disc space narrowing and endplate osteophytes. No evidence of large disc herniation, significant spinal stenosis or foraminal narrowing in the upper thoracic spine. There is a mass effect on the thecal sac at T9-10 with moderate foraminal narrowing, greater on the left. Moderate chronic osseous foraminal narrowing on the left at T11-12 appears unchanged. IMPRESSION: 1. No change from thoracic spine CT of the previous day. 2. Unchanged appearance of discitis/osteomyelitis at T9-10 with paraspinal inflammatory changes. No focal fluid collection identified. There is mass effect on the thecal sac with left greater than right foraminal narrowing. Unchanged loosening of the T10 pedicle screws, greater on the left. 3. Unchanged appearance of remote discitis and osteomyelitis at T11-12. 4. No acute osseous findings. 5. Unchanged multilevel thoracic spondylosis. Electronically Signed   By: Carey Bullocks M.D.   On: 01/08/2024 17:02   DG FL GUIDED LP INJECT MYELOGRAM THORACIC Result Date: 01/08/2024 CLINICAL DATA:  Thoracolumbar posterior fusion, thoracic spondylosis with myelopathy FLUOROSCOPY: 1 minute 36 seconds, 38.1 mg PROCEDURE: LUMBAR PUNCTURE FOR THORACIC MYELOGRAM After thorough discussion of risks and benefits of the procedure including bleeding, infection, injury to nerves, blood vessels, adjacent structures as well as headache and CSF leak, written and oral informed consent was obtained. Consent was obtained by Dr. Judie Petit. Shick. Patient was positioned prone on the fluoroscopy table. Local anesthesia was provided with 1% lidocaine without epinephrine after prepped and draped in the usual sterile fashion. Puncture was performed at L1-2 using a 3 1/2 inch 20 gauge spinal needle via midline approach. Using a single pass through the dura, the needle was placed within the thecal sac, with return of clear CSF. 10 mL of omni 300 was injected into the  thecal sac, with  normal opacification of the nerve roots and cauda equina consistent with free flow within the subarachnoid space. The patient was then moved to the trendelenburg position and contrast flowed into the Thoracic spine region. I personally performed the lumbar puncture and administered the intrathecal contrast. I also personally supervised acquisition of the myelogram images. TECHNIQUE: Spot fluoroscopic imaging performed for documentation. FINDINGS: THORACIC MYELOGRAM FINDINGS: Imaging confirms needle placement at the L1-2 level. Clear CSF obtained. Successful thecal injection of the 10 cc Omnipaque 300 contrast. Good dispersal initially in the lumbar region. Trendelenburg position allowed good dispersal throughout the thoracic region. Next, thoracic CT imaging will be performed. IMPRESSION: Successful lumbar puncture for thoracic myelogram. Electronically Signed   By: Judie Petit.  Shick M.D.   On: 01/08/2024 16:55   CT THORACIC SPINE W CONTRAST Result Date: 01/07/2024 CLINICAL DATA:  Mid back pain EXAM: CT THORACIC SPINE WITH CONTRAST TECHNIQUE: Multidetector CT images of thoracic was performed according to the standard protocol following intravenous contrast administration. RADIATION DOSE REDUCTION: This exam was performed according to the departmental dose-optimization program which includes automated exposure control, adjustment of the mA and/or kV according to patient size and/or use of iterative reconstruction technique. CONTRAST:  75mL OMNIPAQUE IOHEXOL 350 MG/ML SOLN COMPARISON:  12/19/2023 FINDINGS: Alignment: Grade 1 retrolisthesis at T11-12 Vertebrae: Erosive endplate changes at T9-10 and T11-12 are unchanged since 12/19/2023. Lucency about the left T10 transpedicular screws unchanged. Paraspinal and other soft tissues: Spinal cord stimulator enters the epidural space at the T8-9 level. There is edema within the soft tissues surrounding the T9 and T10 vertebral bodies. Disc levels: There is poorly  defined epidural contrast enhancement at the T9-10 level. IMPRESSION: 1. Erosive endplate changes at T9-10 and T11-12 are unchanged since 12/19/2023 and remain concerning for discitis-osteomyelitis. 2. Poorly defined epidural contrast enhancement at the T9-10 level, concerning for epidural abscess. MRI with and without contrast would be more definitive. 3. Lucency about the left T10 transpedicular screws unchanged. Electronically Signed   By: Deatra Robinson M.D.   On: 01/07/2024 22:27    I have personally spent 28 minutes involved in face-to-face and non-face-to-face activities for this patient on the day of the visit. Professional time spent includes the following activities: Preparing to see the patient (review of tests), Obtaining and/or reviewing separately obtained history (admission/discharge record), Performing a medically appropriate examination and/or evaluation , Ordering medications/tests/procedures, referring and communicating with other health care professionals, Documenting clinical information in the EMR, Independently interpreting results (not separately reported), Communicating results to the patient/family/caregiver, Counseling and educating the patient/family/caregiver and Care coordination (not separately reported).    Marcos Eke, NP [facility]  01/10/2024  3:59 PM

## 2024-01-10 NOTE — Plan of Care (Signed)
  Problem: Coping: Goal: Ability to adjust to condition or change in health will improve Outcome: Progressing   Problem: Fluid Volume: Goal: Ability to maintain a balanced intake and output will improve Outcome: Progressing   Problem: Health Behavior/Discharge Planning: Goal: Ability to identify and utilize available resources and services will improve Outcome: Progressing Goal: Ability to manage health-related needs will improve Outcome: Progressing   Problem: Metabolic: Goal: Ability to maintain appropriate glucose levels will improve Outcome: Not Progressing   Problem: Nutritional: Goal: Maintenance of adequate nutrition will improve Outcome: Progressing Goal: Progress toward achieving an optimal weight will improve Outcome: Progressing   Problem: Skin Integrity: Goal: Risk for impaired skin integrity will decrease Outcome: Progressing   Problem: Tissue Perfusion: Goal: Adequacy of tissue perfusion will improve Outcome: Progressing   Problem: Education: Goal: Knowledge of General Education information will improve Description: Including pain rating scale, medication(s)/side effects and non-pharmacologic comfort measures Outcome: Progressing   Problem: Health Behavior/Discharge Planning: Goal: Ability to manage health-related needs will improve Outcome: Progressing   Problem: Clinical Measurements: Goal: Ability to maintain clinical measurements within normal limits will improve Outcome: Progressing Goal: Will remain free from infection Outcome: Progressing

## 2024-01-10 NOTE — Care Management Important Message (Signed)
 Important Message  Patient Details  Name: Katelyn [name] MRN: [id number] Date of Birth: [birth year]   Important Message Given:  Yes - Medicare IM     Dorena Bodo 01/10/2024, 12:19 PM

## 2024-01-10 NOTE — Progress Notes (Signed)
 Inpatient Rehab Coordinator Note:  I met with pt and her spouse at bedside to discuss CIR recommendations and goals/expectations of CIR stay.  We reviewed 3 hrs/day of therapy, physician follow up, and average length of stay 2 weeks (dependent upon progress) with goals of supervision to mod I.  I clarified with pt and spouse that her baseline (up until this past Friday) was mod I with a RW for mobility/ADLs and that she was able to "putter in the flower beds".  I reviewed need for insurance approval and they confirm [employer] as payor.  Dr. Shearon Stalls to see in consult today to confirm goals/ELOS.  Pt's spouse can provide 24/7 at discharge if needed.    Estill Dooms, PT, DPT Admissions Coordinator [phone] 01/10/24  3:30 PM

## 2024-01-10 NOTE — Progress Notes (Signed)
 Pharmacy Antibiotic Note  Katelyn [name] is a 68 y.o. female admitted on 01/07/2024 with  s/p spinal surgery, drain in place .    Change to abx to dapto and ertapenem per ID for epidural abscess.   Plan: Dapto 500mg  (8mg /kg) IV q24 Ertapenem 1g IV q24 Weekly CK  Height: 5\' 1"  (154.9 cm) Weight: 90.7 kg (200 lb) IBW/kg (Calculated) : 47.8  Temp (24hrs), Avg:98 F (36.7 C), Min:97.6 F (36.4 C), Max:98.5 F (36.9 C)  Recent Labs  Lab 01/07/24 1959 01/10/24 0608  WBC 8.2  --   CREATININE 1.55* 1.33*    Estimated Creatinine Clearance: 42.7 mL/min (A) (by C-G formula based on SCr of 1.33 mg/dL (H)).    Allergies  Allergen Reactions   Atorvastatin Nausea And Vomiting and Other (See Comments)    MYALGIAS   Talwin [Pentazocine] Other (See Comments)    headache   Cefepime Other (See Comments)    Presumed AMS/neurotoxicity in the setting of AKI   Gabapentin Swelling   Other Nausea Only    UNSPECIFIED Anesthesia    Antimicrobials this admission: Cefazolin pre-op 4/2 Vancomycin 4/2 >>4/3 Dapto 4/3>> Ertapenem 4/3>>  Dose adjustments this admission:  Microbiology results: 4/2 Intervertebral disc: ngtd MRSA neg  Ulyses Southward, PharmD, BCIDP, AAHIVP, CPP Infectious Disease Pharmacist 01/10/2024 5:20 PM

## 2024-01-10 NOTE — Consult Note (Signed)
 Physical Medicine and Rehabilitation Consult Reason for Consult: Evaluate appropriateness for Inpatient Rehab Referring Physician: Dr.  Yetta Barre     HPI: Katelyn [name] is a 68 y.o. female with PMHx of  has a past medical history of Anxiety, Arthritis, Bursitis of right hip, CAD (coronary artery disease), Chest pain, neg MI, stable CAD non obstructive on cath 10/05/20 (10/04/2020), Chronic diastolic heart failure (HCC) (13/05/6577), Chronic kidney disease, stage 3 (HCC), Chronic low back pain without sciatica (03/14/2016), Cirrhosis of liver (HCC), CKD (chronic kidney disease), stage III (HCC) (10/07/2020), Complication of anesthesia, Cough (04/19/2017), Depression, Dyspnea, Elevated liver enzymes (12/05/2016), Essential hypertension, GERD (gastroesophageal reflux disease), Gout (03/14/2016), Greater trochanteric bursitis of right hip (02/02/2012), H/O hiatal hernia, Heart murmur, History of blood transfusion (2016), History of esophageal stricture (10/07/2020), History of kidney stones, Hypercholesterolemia, Hypertensive heart disease with heart failure (HCC) (01/01/2017), Hypoxia (10/07/2020), Iliotibial band syndrome of right side (02/02/2012), Iron deficiency anemia due to chronic blood loss (03/14/2016), LBBB (left bundle branch block) (01/01/2017), Left bundle branch block, Leg weakness (10/07/2020), Lumbar stenosis, Meralgia paraesthetica (12/05/2016), Mild CAD (11/24/2015), Morbid obesity (HCC) (10/07/2020), Neuropathy, OSA (obstructive sleep apnea) (05/24/2016), PONV (postoperative nausea and vomiting), Restless leg syndrome, S/P lumbar laminectomy (11/26/2015), S/P lumbar spinal fusion (08/29/2016), Tinnitus (12/05/2016), Type 2 diabetes mellitus (HCC), and UTI (urinary tract infection) (10/07/2020). . They were admitted to [facility] on 01/07/2024 for right greater than left lower extremity weakness and decreased sensation in her legs..  Of note, she had prior T10-L2 posterior fixation  and removal of spinal cord stimulator in December, and recently developed foot drop and suspected infection  at T9-10, for which she has seen neurosurgery as an outpatient on 3-28 and who advised her over the phone to go to the ED on 3-31.  Myelogram on 4-1 shows significant stenosis at the T10 level with sequela of discitis.  On 4-2, she went underwent T7-L2 fusion and replacement of T10, L1, and T12 screws. Intraoperative cultures showed NGTD.  Postop course was uncomplicated.  PM&R was consulted to evaluate appropriateness for IPR admission.    According to patient and her husband, the patient lives in a private residence with a ramped entrance in a single level, and is generally Ind for short distances and for long distances Mod I with a rollator; she has needed to use a platform walker and a wheelchair only over the week prior to hospitalization.  She is independent of ADLs at baseline; is Mod I for donning compression socks and needs assistance with driving only. Husband is able to provide 24/7 physical assistance on return to home.   Review of Systems  Constitutional:  Negative for chills and fever.  Respiratory:  Negative for cough, hemoptysis and shortness of breath.   Cardiovascular:  Negative for chest pain and palpitations.  Gastrointestinal:  Positive for abdominal pain and constipation. Negative for diarrhea, nausea and vomiting.  Genitourinary:  Positive for urgency. Negative for dysuria.  Musculoskeletal:  Positive for back pain and falls.  Neurological:  Positive for sensory change and focal weakness. Negative for dizziness and headaches.  Psychiatric/Behavioral:  The patient is nervous/anxious.    Past Medical History:  Diagnosis Date   Anxiety    Arthritis    Bursitis of right hip    CAD (coronary artery disease)    Cardiac catheterization June 2014 in [place] - 50% circumflex stenosis   Chest pain, neg MI, stable CAD non obstructive on cath 10/05/20 10/04/2020   Chronic  diastolic heart failure (HCC) 08/20/2017   Chronic kidney disease, stage 3 (HCC)    does not see nephrologist   Chronic low back pain without sciatica 03/14/2016   Cirrhosis of liver (HCC)    CKD (chronic kidney disease), stage III (HCC) 10/07/2020   Complication of anesthesia    Cough 04/19/2017   Overview:  Last Assessment & Plan:  Formatting of this note may be different from the original. Cough - ? ACE related with AR triggers   Plan  Patient Instructions  Discuss with your primary doctor that lisinopril pain, need making your cough worse. May use Mucinex DM twice daily as needed for cough and congestion Zyrtec 10 mg at bedtime as needed for drainage Saline nasal spray as needed. Lab tests today Activity as tolerated. Follow with Dr. Craige Cotta in 3-4 months and As needed   Please contact office for sooner follow up if symptoms do not improve or worsen or seek emergency care    Depression    Dyspnea    with exertion   " lazy lung" - per  Dr Craige Cotta from back issues- 06/2016   Elevated liver enzymes 12/05/2016   Essential hypertension    GERD (gastroesophageal reflux disease)    Gout 03/14/2016   Greater trochanteric bursitis of right hip 02/02/2012   H/O hiatal hernia    Heart murmur    History of blood transfusion 2016   History of esophageal stricture 10/07/2020   History of kidney stones    Hypercholesterolemia    Hypertensive heart disease with heart failure (HCC) 01/01/2017   Hypoxia 10/07/2020   Iliotibial band syndrome of right side 02/02/2012   Iron deficiency anemia due to chronic blood loss 03/14/2016   LBBB (left bundle branch block) 01/01/2017   Left bundle branch block    Leg weakness 10/07/2020   Lumbar stenosis    Meralgia paraesthetica 12/05/2016   Mild CAD 11/24/2015   Morbid obesity (HCC) 10/07/2020   Neuropathy    OSA (obstructive sleep apnea) 05/24/2016   Overview:  Managed PULM- no CPAP   PONV (postoperative nausea and vomiting)    "no N/V with patch"   Restless  leg syndrome    S/P lumbar laminectomy 11/26/2015   S/P lumbar spinal fusion 08/29/2016   Tinnitus 12/05/2016   Type 2 diabetes mellitus (HCC)    UTI (urinary tract infection) 10/07/2020   Past Surgical History:  Procedure Laterality Date   ABDOMINAL HYSTERECTOMY  1983   APPENDECTOMY  Age 64   BACK SURGERY     FIRST LUMBAR FUSION/ SURGERY APRIL 2012 AND FUSION WITH INSTRUMENTATION SEPT 2012   CARDIAC CATHETERIZATION     x 2   CARPAL TUNNEL RELEASE     bil   CHOLECYSTECTOMY  1990's   COLONOSCOPY  12/12/2017   Colonic polyp status post polypectomy. Mild sigmoid diverticulosis. Otherwise normal colonoscopy to terminal ileum.   CYSTO EXTRACTION KIDNEY STONES     ESOPHAGOGASTRODUODENOSCOPY  12/12/2017   Small hiatal hernia. Mild gastritis. Status post esophageal dilatation.   EXCISION/RELEASE BURSA HIP  02/02/2012   Procedure: EXCISION/RELEASE BURSA HIP;  Surgeon: Jacki Cones, MD;  Location: [facility] ORS;  Service: Orthopedics;  Laterality: Right;  Right Hip Bursectomy   EYE SURGERY Bilateral    cataracts   IR THORACIC DISC ASPIRATION W/IMG GUIDE  02/14/2023   KIDNEY STONE SURGERY  2008   LAMINECTOMY WITH POSTERIOR LATERAL ARTHRODESIS LEVEL 4 N/A 02/19/2023   Procedure: THORACIC TEN-LUMBAR TWO INSTRUMENTED FUSION;  Surgeon: Tia Alert,  MD;  Location: [facility] OR;  Service: Neurosurgery;  Laterality: N/A;   Left knee surgery x 2  1996   reconstruction   LUMBAR DISC SURGERY  08/2016   LUMBAR LAMINECTOMY/DECOMPRESSION MICRODISCECTOMY Right 11/26/2015   Procedure: Extraforaminal Microdiscectomy  - Lumbar two-three- right;  Surgeon: Tia Alert, MD;  Location: [facility]UR[facility] OR[facility];  Service: Neurosurgery;  Laterality: Right;  right    LUMBAR WOUND DEBRIDEMENT N/A 10/25/2016   Procedure: Lumbar wound revision;  Surgeon: Tia Alert, MD;  Location: [facility] OR;  Service: Neurosurgery;  Laterality: N/A;  Lumbar wound revision   Right shoulder surgery  2010   spur   RIGHT/LEFT HEART CATH AND CORONARY  ANGIOGRAPHY N/A 10/05/2020   Procedure: RIGHT/LEFT HEART CATH AND CORONARY ANGIOGRAPHY;  Surgeon: [place], Peter M, MD;  Location: [facility] INVASIVE CV LAB;  Service: Cardiovascular;  Laterality: N/A;   SPINAL CORD STIMULATOR REMOVAL  02/19/2023   Procedure: LUMBAR SPINAL CORD STIMULATOR REMOVAL;  Surgeon: Tia Alert, MD;  Location: [facility] OR;  Service: Neurosurgery;;   Family History  Problem Relation Age of Onset   Lung cancer Father        smoked   Hypertension Father    Stroke Father    Heart failure Mother    Bone cancer Sister    Asthma Sister    Social History:  reports that she has never smoked. She has never used smokeless tobacco. She reports that she does not currently use alcohol. She reports that she does not use drugs. Allergies:  Allergies  Allergen Reactions   Atorvastatin Nausea And Vomiting and Other (See Comments)    MYALGIAS   Talwin [Pentazocine] Other (See Comments)    headache   Cefepime Other (See Comments)    Presumed AMS/neurotoxicity in the setting of AKI   Gabapentin Swelling   Other Nausea Only    UNSPECIFIED Anesthesia   Medications Prior to Admission  Medication Sig Dispense Refill   carvedilol (COREG) 6.25 MG tablet Take 1 tablet (6.25 mg total) by mouth 2 (two) times daily. Take 6.25 mg by mouth 2 (two) times daily. / Needs appointment for further refills 60 tablet 0   ciprofloxacin (CIPRO) 500 MG tablet Take 1 tablet (500 mg total) by mouth 2 (two) times daily. 180 tablet 1   cyclobenzaprine (FLEXERIL) 5 MG tablet Take 5 mg by mouth 3 (three) times daily as needed for muscle spasms.     doxycycline (VIBRA-TABS) 100 MG tablet Take 1 tablet (100 mg total) by mouth 2 (two) times daily. Start taking 04/04/23 180 tablet 1   DULoxetine (CYMBALTA) 60 MG capsule Take 60 mg by mouth at bedtime.      loratadine (CLARITIN) 10 MG tablet Take 10 mg by mouth daily.     milk thistle 175 MG tablet Take 175 mg by mouth in the morning and at bedtime.     mirtazapine  (REMERON) 30 MG tablet Take 30 mg by mouth at bedtime.     Multiple Vitamin (MULTIVITAMIN WITH MINERALS) TABS tablet Take 1 tablet by mouth daily.     Multiple Vitamins-Minerals (HAIR SKIN AND NAILS FORMULA) TABS Take 1 tablet by mouth daily.     nitroGLYCERIN (NITROSTAT) 0.4 MG SL tablet Place 0.4 mg under the tongue every 5 (five) minutes as needed for chest pain.     NOVOLOG RELION 100 UNIT/ML injection Inject 2-20 Units into the skin 3 (three) times daily with meals.     oxyCODONE (OXY IR/ROXICODONE) 5 MG immediate release tablet  Take 0.5 tablets (2.5 mg total) by mouth every 6 (six) hours as needed for severe pain. 15 tablet 0   pantoprazole (PROTONIX) 40 MG tablet Take 1 tablet (40 mg total) by mouth daily. 90 tablet 2   pramipexole (MIRAPEX) 1 MG tablet Take 1 mg by mouth at bedtime.     pregabalin (LYRICA) 75 MG capsule Take 75 mg by mouth 2 (two) times daily.     tirzepatide [facility]) 5 MG/0.5ML Pen Inject 5 mg into the skin once a week.     torsemide (DEMADEX) 20 MG tablet Take 40 mg by mouth daily.     TRESIBA FLEXTOUCH 200 UNIT/ML FlexTouch Pen Inject 20 Units into the skin at bedtime. (Patient taking differently: Inject 60-120 Units into the skin at bedtime.) 3 mL 0   Continuous Glucose Sensor (FREESTYLE LIBRE 3 PLUS SENSOR) MISC Inject 1 Application into the skin.      Home: Home Living Family/patient expects to be discharged to:: Private residence Living Arrangements: Spouse/significant other Available Help at Discharge: Family, Available 24 hours/day Type of Home: House Home Access: Ramped entrance Home Layout: One level Bathroom Shower/Tub: Health visitor: Handicapped height Bathroom Accessibility: Yes Home Equipment: Agricultural consultant (2 wheels), Rollator (4 wheels), Wheelchair - manual, Shower seat, Grab bars - tub/shower, BSC/3in1, Hand held shower head, Adaptive equipment, Other (comment) Adaptive Equipment: Reacher, Sock aid, Long-handled shoe horn,  Long-handled sponge  Functional History: Prior Function Prior Level of Function : Needs assist Mobility Comments: using wc more recently, husband assist with transfers ADLs Comments: Husband assist with transfers & LB ADLs Functional Status:  Mobility: Bed Mobility Overal bed mobility: Needs Assistance Bed Mobility: Supine to Sit, Sit to Supine Supine to sit: Max assist, HOB elevated, Used rails Sit to supine: Max assist, HOB elevated, Used rails General bed mobility comments: Assist for Bil LEs and trunk Transfers Overall transfer level: Needs assistance General transfer comment: Not safe to complete      ADL: ADL Overall ADL's : Needs assistance/impaired Eating/Feeding: Set up, Bed level Grooming: Set up, Bed level Upper Body Bathing: Set up, Bed level Lower Body Bathing: Total assistance, Bed level Upper Body Dressing : Set up, Bed level Lower Body Dressing: Total assistance, Sitting/lateral leans Lower Body Dressing Details (indicate cue type and reason): Per pt assist at baseline  Cognition: Cognition Orientation Level: Oriented X4 Cognition Arousal: Alert Behavior During Therapy: WFL for tasks assessed/performed  Blood pressure (!) 146/67, pulse 91, temperature 98.5 F (36.9 C), temperature source Oral, resp. rate 18, height 5\' 1"  (1.549 m), weight 90.7 kg, SpO2 98%. Physical Exam Constitutional: No apparent distress. Appropriate appearance for age. +Obese HENT: No JVD. Neck Supple. Trachea midline. + swelling R lip Eyes: PERRLA. EOMI. Visual fields grossly intact.  Cardiovascular: RRR, no murmurs/rub/gallops. 2+ bilateral peripheral Edema. Peripheral pulses 2+  Respiratory: CTAB. No rales, rhonchi, or wheezing. On RA.  Abdomen: + bowel sounds, normoactive. No distention or tenderness.  GU: Not examined. +Foley, draining clear urine.  Skin: Peripheral IVs intact. Wound vac well adhered to back incision, draining minimal gross blood.   MSK:      No apparent  deformity. Full PROM BL UE       Neurologic exam:  Cognition: AAO to person, place, time and event.  Language: Fluent, No substitutions or neoglisms. No dysarthria. Names 3/3 objects correctly.  Memory: Recalls 3/3 objects at 5 minutes. No apparent deficits  Insight: Good insight into current condition.  Mood: Pleasant affect, appropriate mood. Slightly tearful.  Sensation: To light touch reduced in distal fingertips and toes bilaterally; reduced on bilateral medial knees and R lateral knee.   Reflexes: 2+ in BL UE; hyporeflexic BL LE. No babinski. CN: 2-12 grossly intact.  Coordination: No apparent tremors. No ataxia on FTN bilaterally.  Spasticity: MAS 0 in all extremities.       Strength:                RUE: 5/5 SA, 5/5 EF, 5/5 EE, 5/5 WE, 5/5 FF, 5/5 FA                LUE:  5/5 SA, 5/5 EF, 5/5 EE, 5/5 WE, 5/5 FF, 5/5 FA                RLE: 2/5 HF, 3/5 KE, 4-/5  DF, 4-/5  EHL, 4-/5  PF                 LLE:  3+/5 HF, 4-/5 KE, 5-/5  DF, 5-/5  EHL, 5-/5  PF     Results for orders placed or performed during the hospital encounter of 01/07/24 (from the past 24 hours)  Glucose, capillary     Status: Abnormal   Collection Time: 01/09/24  2:13 PM  Result Value Ref Range   Glucose-Capillary 232 (H) 70 - 99 mg/dL   Comment 1 Notify RN    Comment 2 Document in Chart   Prepare RBC (crossmatch)     Status: None   Collection Time: 01/09/24  4:34 PM  Result Value Ref Range   Order Confirmation      ORDER PROCESSED BY BLOOD BANK Performed at [facility], [address], [place], [place] [address]   Aerobic/Anaerobic Culture w Gram Stain (surgical/deep wound)     Status: None (Preliminary result)   Collection Time: 01/09/24  4:34 PM   Specimen: Intervertebral Disc; Tissue  Result Value Ref Range   Specimen Description WOUND    Special Requests INTERVERTERAL DISC 9,10 PT ON ANCEF    Gram Stain NO WBC SEEN NO ORGANISMS SEEN     Culture      NO GROWTH < 12 HOURS Performed at [facility], [address] [place], [place] [address]    Report Status PENDING   Aerobic/Anaerobic Culture w Gram Stain (surgical/deep wound)     Status: None (Preliminary result)   Collection Time: 01/09/24  4:44 PM   Specimen: Intervertebral Disc; Tissue  Result Value Ref Range   Specimen Description WOUND    Special Requests INTERVERTEBRAL DISC 9,10 SWAB PT ON ANCEF    Gram Stain NO WBC SEEN NO ORGANISMS SEEN     Culture      NO GROWTH < 12 HOURS Performed at [facility], [address], [place], [place] [address]    Report Status PENDING   I-STAT 7, (LYTES, BLD GAS, ICA, H+H)     Status: Abnormal   Collection Time: 01/09/24  4:56 PM  Result Value Ref Range   pH, Arterial 7.452 (H) 7.35 - 7.45   pCO2 arterial 37.4 32 - 48 mmHg   pO2, Arterial 266 (H) 83 - 108 mmHg   Bicarbonate 26.3 20.0 - 28.0 mmol/L   TCO2 28 22 - 32 mmol/L   O2 Saturation 100 %   Acid-Base Excess 2.0 0.0 - 2.0 mmol/L   Sodium 136 135 - 145 mmol/L   Potassium 4.4 3.5 - 5.1 mmol/L   Calcium, Ion 1.18 1.15 -  1.40 mmol/L   HCT 26.0 (L) 36.0 - 46.0 %   Hemoglobin 8.8 (L) 12.0 - 15.0 g/dL   Patient temperature 84.6 C    Sample type ARTERIAL   Glucose, capillary     Status: Abnormal   Collection Time: 01/09/24  6:16 PM  Result Value Ref Range   Glucose-Capillary 234 (H) 70 - 99 mg/dL   Comment 1 Notify RN   Glucose, capillary     Status: Abnormal   Collection Time: 01/09/24  8:16 PM  Result Value Ref Range   Glucose-Capillary 248 (H) 70 - 99 mg/dL   Comment 1 Notify RN   Creatinine, serum     Status: Abnormal   Collection Time: 01/10/24  6:08 AM  Result Value Ref Range   Creatinine, Ser 1.33 (H) 0.44 - 1.00 mg/dL   GFR, Estimated 44 (L) >60 mL/min  Glucose, capillary     Status: Abnormal   Collection Time: 01/10/24  6:36 AM  Result Value Ref Range   Glucose-Capillary 318 (H) 70 - 99 mg/dL  Glucose, capillary     Status: Abnormal   Collection Time: 01/10/24 11:47 AM  Result Value Ref  Range   Glucose-Capillary 368 (H) 70 - 99 mg/dL   DG Thoracic Spine 2 View Result Date: 01/09/2024 CLINICAL DATA:  Elective surgery. EXAM: THORACIC SPINE 2 VIEWS COMPARISON:  Preoperative imaging FINDINGS: Five fluoroscopic spot views from the operating room submitted. Portions of thoracolumbar fusion hardware visualized, now extending cranially into the T7 level. Fluoroscopy time 74.5 seconds. Dose 26.05 mGy. Portions of a spinal stimulator are seen. IMPRESSION: Intraoperative fluoroscopy during thoracic spine surgery. Electronically Signed   By: Narda Rutherford M.D.   On: 01/09/2024 18:20   DG C-Arm 1-60 Min-No Report Result Date: 01/09/2024 Fluoroscopy was utilized by the requesting physician.  No radiographic interpretation.   DG C-Arm 1-60 Min-No Report Result Date: 01/09/2024 Fluoroscopy was utilized by the requesting physician.  No radiographic interpretation.   DG C-Arm 1-60 Min-No Report Result Date: 01/09/2024 Fluoroscopy was utilized by the requesting physician.  No radiographic interpretation.   CT THORACIC SPINE W CONTRAST Result Date: 01/08/2024 CLINICAL DATA:  Mid back pain.  Myelogram.  Osteomyelitis. EXAM: CT THORACIC SPINE WITH CONTRAST TECHNIQUE: Multidetector CT images of thoracic was performed according to the standard protocol following intrathecal contrast administration. RADIATION DOSE REDUCTION: This exam was performed according to the departmental dose-optimization program which includes automated exposure control, adjustment of the mA and/or kV according to patient size and/or use of iterative reconstruction technique. CONTRAST:  10mL OMNIPAQUE IOHEXOL 300 MG/ML  SOLN COMPARISON:  Thoracic spine CT 01/08/2024, 01/07/2024 FINDINGS: Alignment: Unchanged grade 1 retrolisthesis at T11-12. Vertebrae: Erosive endplate changes at T9-10 and T11-12, unchanged. Lucency about the T10 transpedicular screws, greater on the left. Paraspinal and other soft tissues: Edema within the paraspinal  soft tissues at T9-10. Disc levels: The thecal sac is severely attenuated at the T9-10 level by soft tissue density material, likely epidural phlegmon/abscess. Thecal sac caliber is normal below the T9-10 level. There is right asymmetric clumping of the nerve roots within the upper lumbar spinal canal. IMPRESSION: 1. Severe thecal sac attenuation at the T9-10 level by soft tissue density material, likely epidural phlegmon/abscess. 2. 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 3. Unchanged endplate erosion at T9-10 and T11-12 Electronically Signed   By: Deatra Robinson M.D.   On: 01/08/2024 20:43   DG FL GUIDED LP INJECT MYELOGRAM THORACIC  Result Date: 01/08/2024 CLINICAL DATA:  Thoracolumbar posterior fusion, thoracic spondylosis with myelopathy FLUOROSCOPY: 1 minute 36 seconds, 38.1 mg PROCEDURE: LUMBAR PUNCTURE FOR THORACIC MYELOGRAM After thorough discussion of risks and benefits of the procedure including bleeding, infection, injury to nerves, blood vessels, adjacent structures as well as headache and CSF leak, written and oral informed consent was obtained. Consent was obtained by Dr. Judie Petit. Shick. Patient was positioned prone on the fluoroscopy table. Local anesthesia was provided with 1% lidocaine without epinephrine after prepped and draped in the usual sterile fashion. Puncture was performed at L1-2 using a 3 1/2 inch 20 gauge spinal needle via midline approach. Using a single pass through the dura, the needle was placed within the thecal sac, with return of clear CSF. 10 mL of omni 300 was injected into the thecal sac, with normal opacification of the nerve roots and cauda equina consistent with free flow within the subarachnoid space. The patient was then moved to the trendelenburg position and contrast flowed into the Thoracic spine region. I personally performed the lumbar puncture and administered the intrathecal contrast. I also personally  supervised acquisition of the myelogram images. TECHNIQUE: Spot fluoroscopic imaging performed for documentation. FINDINGS: THORACIC MYELOGRAM FINDINGS: Imaging confirms needle placement at the L1-2 level. Clear CSF obtained. Successful thecal injection of the 10 cc Omnipaque 300 contrast. Good dispersal initially in the lumbar region. Trendelenburg position allowed good dispersal throughout the thoracic region. Next, thoracic CT imaging will be performed. IMPRESSION: Successful lumbar puncture for thoracic myelogram. Electronically Signed   By: Judie Petit.  Shick M.D.   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 Yes Co-Morbidities requiring supervision/potential complications: Poor pain control, post-op wound monitoring, hypertension, liver cirrhosis, anemia, CKD stage III, CHF, urinary retention, and constipation Due to bladder management, bowel management, safety, skin/wound care, disease management, medication administration, pain management, and patient education, does the patient require 24 hr/day rehab nursing? Yes Does the patient require coordinated care of a physician, rehab nurse, therapy disciplines of PT, OT to address physical and functional deficits in the context of the above medical diagnosis(es)? Yes Addressing deficits in the following areas: balance, endurance, locomotion, strength, transferring, bowel/bladder control, bathing, dressing, feeding, grooming, and toileting Can the patient actively participate in an intensive therapy program of at least 3 hrs of therapy per day at least 5 days per week? Yes The potential for patient to make measurable gains while on inpatient rehab is good Anticipated functional outcomes  upon discharge from inpatient rehab are min assist  with PT, supervision with OT. Estimated rehab length of stay to reach the above functional goals is: 14-16 days Anticipated discharge destination: Home Overall Rehab/Functional Prognosis: good  POST ACUTE RECOMMENDATIONS: This patient's condition is appropriate for continued rehabilitative care in the following setting: CIR Patient has agreed to participate in recommended program. Yes Note that insurance prior authorization may be required for reimbursement for recommended care.  Comment: Isabellah Sobocinski is a 68 year old prior Mod I ambulation and independent for ADLs (except donning compression socks) who presents with T10 SCI with myelopathy, now s/p T7-L2 fusion and replacement of T10, L1, and T12 screws. She is regaining sensation and mobility in her legs but could benefit from inpatient rehab stay  for continued strengthening and conditioning. She has a supportive husband who can provide 24/7 assist but due to her size will be limited in capabilities to Min A level. In addition, she meets medical necessity due to recent surgical wound with a vac, poor pain control, urinary retention/neurogenic bladder with a foley, poorly controlled diabetes with hyperglycemia, hypertension, and acute blood loss anemia.    MEDICAL RECOMMENDATIONS: Apply ACE wraps to assist in edema management as patient cannot currently don her home TED hose Agree with increasing diabetes regimen; consider addition of standing premeal insulin for PO intakes >50% Increase bowel regimen with daily miralax DC foley trial when medically feasible   I have personally performed a face to face diagnostic evaluation of this patient. Additionally, I have examined the patient's medical record including any pertinent labs and radiographic images. If the physician assistant has documented in this note, I have reviewed and edited or otherwise concur with the physician assistant's  documentation.  Thanks,  [name], DO 01/10/2024

## 2024-01-10 NOTE — Evaluation (Addendum)
 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  thoracic laminectomy, Posterior lateral fusion T7-L1, extension of fusion, and removal of T10 screws performed 4/3  PMH:  T11-12, T10-L2 posterior fixation and removal of spinal stimulator; multiple lumbar surgeries, anxiety, chronic low back pain with multiple lumbar surgeries,  DM2, obesity, OSA, LBBB, HTN, hypercholesterolemia, CKD, CAD.     Clinical Impressions Pt admitted based on above, and was seen based on problem list below. PTA pt was living with her spouse requiring assistance for LB ADLs and transfers. Today pt is requiring set up  to total for  ADLs. Bed mobility was max assist. Pt required Bil UE support to maintain sitting EOB, able to tolerate  5 minutes before c/o of fatigue and decreased sitting balance. Recommendation of rehab consult and >3 hours of skilled rehab daily to optimize independence levels. OT will continue to follow acutely to maximize functional independence.        If plan is discharge home, recommend the following:   Two people to help with walking and/or transfers;A lot of help with bathing/dressing/bathroom;Assistance with cooking/housework;Direct supervision/assist for medications management;Direct supervision/assist for financial management;Assist for transportation;Help with stairs or ramp for entrance;Supervision due to cognitive status     Functional Status Assessment   Patient has had a recent decline in their functional status and demonstrates the ability to make significant improvements in function in a reasonable and predictable amount of time.     Equipment Recommendations   Other (comment) (Defer to next venue)       Precautions/Restrictions   Precautions Precautions: Fall Recall of Precautions/Restrictions: Impaired Required Braces or Orthoses: Spinal Brace Spinal Brace: Other (comment) (Not present in room, nor was specified in note, ortho tech was contacted) Restrictions Weight Bearing Restrictions Per Provider Order: No     Mobility Bed Mobility Overal bed mobility: Needs Assistance Bed Mobility: Supine to Sit, Sit to Supine     Supine to sit: Max assist, HOB elevated, Used rails Sit to supine: Max assist, HOB elevated, Used rails   General bed mobility comments: Assist for Bil LEs and trunk    Transfers Overall transfer level: Needs assistance                 General transfer comment: Not safe to complete      Balance Overall balance assessment: Needs assistance Sitting-balance support: Bilateral upper extremity supported, Feet supported Sitting balance-Leahy Scale: Poor Sitting balance - Comments: Min assist to maintain upright, reliant on UE support Postural control: Posterior lean           ADL either performed or assessed with clinical judgement   ADL Overall ADL's : Needs assistance/impaired Eating/Feeding: Set up;Bed level   Grooming: Set up;Bed level   Upper Body Bathing: Set up;Bed level   Lower Body Bathing: Total assistance;Bed level   Upper Body Dressing : Set up;Bed level   Lower Body Dressing: Total assistance;Sitting/lateral leans Lower Body Dressing Details (indicate cue type and reason): Per pt assist at baseline           Vision Baseline Vision/History: 1 Wears glasses Vision Assessment?: No apparent visual deficits       Extremity/Trunk Assessment Upper Extremity Assessment Upper Extremity  Assessment: RUE deficits/detail;Right hand dominant;Generalized weakness RUE Deficits / Details: Per pt previous R shoulder surgery RUE Sensation: WNL RUE Coordination: decreased gross motor [facility])   Lower Extremity Assessment Lower  Extremity Assessment: Defer to PT evaluation       Communication Communication Communication: No apparent difficulties   Cognition Arousal: Alert Behavior During Therapy: WFL for tasks assessed/performed Cognition: No family/caregiver present to determine baseline, Cognition impaired   Orientation impairments: Place (Able to self correct, but kept repeating "I keep thinking I am home") Awareness: Intellectual awareness intact, Online awareness impaired Memory impairment (select all impairments): Working [employer] fast streamer, Short-term memory Attention impairment (select first level of impairment): Sustained attention Executive functioning impairment (select all impairments): Organization, Sequencing, Problem solving OT - Cognition Comments: Pt tangiental and appears to be an unreliable narrator, no family present to determine baseline       Following commands: Intact       Cueing  General Comments   Cueing Techniques: Verbal cues;Tactile cues  Drains intact, VSS on RA           Home Living Family/patient expects to be discharged to:: Private residence Living Arrangements: Spouse/significant other Available Help at Discharge: Family;Available 24 hours/day Type of Home: House Home Access: Ramped entrance     Home Layout: One level     Bathroom Shower/Tub: Producer, television/film/video: Handicapped height Bathroom Accessibility: Yes How Accessible: Accessible via walker Home Equipment: Rolling Walker (2 wheels);Rollator (4 wheels);Wheelchair - manual;Shower seat;Grab bars - tub/shower;BSC/3in1;Hand held shower head;Adaptive equipment;Other (comment) Adaptive Equipment: Reacher;Sock aid;Long-handled shoe horn;Long-handled sponge        Prior Functioning/Environment Prior Level of Function : Needs assist             Mobility Comments: using wc more recently, husband assist with transfers ADLs Comments: Husband assist with transfers & LB ADLs    OT Problem List:  Decreased range of motion;Decreased strength;Decreased activity tolerance;Impaired balance (sitting and/or standing)   OT Treatment/Interventions: Self-care/ADL training;Therapeutic exercise;Energy conservation;DME and/or AE instruction;Therapeutic activities;Cognitive remediation/compensation;Patient/family education;Balance training      OT Goals(Current goals can be found in the care plan section)   Acute Rehab OT Goals OT Goal Formulation: With patient Time For Goal Achievement: 01/24/24 Potential to Achieve Goals: Good ADL Goals Pt Will Perform Lower Body Dressing: with mod assist;sitting/lateral leans Pt Will Transfer to Toilet: bedside commode;with transfer board Pt Will Perform Toileting - Clothing Manipulation and hygiene: with mod assist;sitting/lateral leans   OT Frequency:  Min 1X/week       AM-PAC OT "6 Clicks" Daily Activity     Outcome Measure Help from another person eating meals?: None Help from another person taking care of personal grooming?: A Little Help from another person toileting, which includes using toliet, bedpan, or urinal?: Total Help from another person bathing (including washing, rinsing, drying)?: A Lot Help from another person to put on and taking off regular upper body clothing?: A Little Help from another person to put on and taking off regular lower body clothing?: Total 6 Click Score: 14   End of Session Nurse Communication: Mobility status  Activity Tolerance: Patient tolerated treatment well Patient left: in bed;with call bell/phone within reach;with bed alarm set;with SCD's reapplied  OT Visit Diagnosis: Unsteadiness on feet (R26.81);Other abnormalities of gait and mobility (R26.89);Repeated falls (R29.6);Muscle weakness (generalized) (M62.81)                Time: 1914-7829 OT Time Calculation (min): 39 min Charges:  OT General Charges $OT  Visit: 1 Visit OT Evaluation $OT Eval Moderate Complexity: 1 Mod OT Treatments $Self  Care/Home Management : 23-37 mins  Ivor Messier, OT  Acute Rehabilitation Services Office [phone] Secure chat preferred   Marilynne Drivers 01/10/2024, 1:48 PM

## 2024-01-11 ENCOUNTER — Inpatient Hospital Stay (HOSPITAL_COMMUNITY)
Admission: RE | Admit: 2024-01-11 | Discharge: 2024-02-01 | DRG: 559 | Disposition: A | Discharge status: Home Health | Source: Intra-hospital | Attending: Physical Medicine & Rehabilitation | Admitting: Physical Medicine & Rehabilitation

## 2024-01-11 ENCOUNTER — Other Ambulatory Visit: Payer: Self-pay

## 2024-01-11 ENCOUNTER — Encounter (HOSPITAL_COMMUNITY): Payer: Self-pay | Admitting: Neurological Surgery

## 2024-01-11 DIAGNOSIS — M4640 Discitis, unspecified, site unspecified: Secondary | ICD-10-CM | POA: Diagnosis present

## 2024-01-11 DIAGNOSIS — G4733 Obstructive sleep apnea (adult) (pediatric): Secondary | ICD-10-CM | POA: Diagnosis present

## 2024-01-11 DIAGNOSIS — N179 Acute kidney failure, unspecified: Secondary | ICD-10-CM | POA: Diagnosis present

## 2024-01-11 DIAGNOSIS — Z794 Long term (current) use of insulin: Secondary | ICD-10-CM

## 2024-01-11 DIAGNOSIS — I2510 Atherosclerotic heart disease of native coronary artery without angina pectoris: Secondary | ICD-10-CM | POA: Diagnosis present

## 2024-01-11 DIAGNOSIS — I5032 Chronic diastolic (congestive) heart failure: Secondary | ICD-10-CM | POA: Diagnosis present

## 2024-01-11 DIAGNOSIS — Z79899 Other long term (current) drug therapy: Secondary | ICD-10-CM

## 2024-01-11 DIAGNOSIS — N183 Chronic kidney disease, stage 3 unspecified: Secondary | ICD-10-CM | POA: Diagnosis present

## 2024-01-11 DIAGNOSIS — R1314 Dysphagia, pharyngoesophageal phase: Secondary | ICD-10-CM | POA: Diagnosis present

## 2024-01-11 DIAGNOSIS — Z936 Other artificial openings of urinary tract status: Secondary | ICD-10-CM

## 2024-01-11 DIAGNOSIS — E871 Hypo-osmolality and hyponatremia: Secondary | ICD-10-CM | POA: Diagnosis present

## 2024-01-11 DIAGNOSIS — E1122 Type 2 diabetes mellitus with diabetic chronic kidney disease: Secondary | ICD-10-CM | POA: Diagnosis present

## 2024-01-11 DIAGNOSIS — I8510 Secondary esophageal varices without bleeding: Secondary | ICD-10-CM | POA: Diagnosis present

## 2024-01-11 DIAGNOSIS — Z8249 Family history of ischemic heart disease and other diseases of the circulatory system: Secondary | ICD-10-CM

## 2024-01-11 DIAGNOSIS — N3941 Urge incontinence: Secondary | ICD-10-CM | POA: Diagnosis present

## 2024-01-11 DIAGNOSIS — R270 Ataxia, unspecified: Secondary | ICD-10-CM | POA: Diagnosis present

## 2024-01-11 DIAGNOSIS — M1990 Unspecified osteoarthritis, unspecified site: Secondary | ICD-10-CM | POA: Diagnosis present

## 2024-01-11 DIAGNOSIS — E1140 Type 2 diabetes mellitus with diabetic neuropathy, unspecified: Secondary | ICD-10-CM | POA: Diagnosis present

## 2024-01-11 DIAGNOSIS — Z860100 Personal history of colon polyps, unspecified: Secondary | ICD-10-CM

## 2024-01-11 DIAGNOSIS — G062 Extradural and subdural abscess, unspecified: Secondary | ICD-10-CM | POA: Diagnosis present

## 2024-01-11 DIAGNOSIS — Z90710 Acquired absence of both cervix and uterus: Secondary | ICD-10-CM

## 2024-01-11 DIAGNOSIS — I10 Essential (primary) hypertension: Secondary | ICD-10-CM | POA: Diagnosis not present

## 2024-01-11 DIAGNOSIS — E1169 Type 2 diabetes mellitus with other specified complication: Secondary | ICD-10-CM | POA: Diagnosis present

## 2024-01-11 DIAGNOSIS — Z4789 Encounter for other orthopedic aftercare: Principal | ICD-10-CM

## 2024-01-11 DIAGNOSIS — G2581 Restless legs syndrome: Secondary | ICD-10-CM | POA: Diagnosis present

## 2024-01-11 DIAGNOSIS — R739 Hyperglycemia, unspecified: Secondary | ICD-10-CM | POA: Diagnosis not present

## 2024-01-11 DIAGNOSIS — B001 Herpesviral vesicular dermatitis: Secondary | ICD-10-CM | POA: Diagnosis present

## 2024-01-11 DIAGNOSIS — M7989 Other specified soft tissue disorders: Secondary | ICD-10-CM | POA: Diagnosis present

## 2024-01-11 DIAGNOSIS — K5900 Constipation, unspecified: Secondary | ICD-10-CM | POA: Diagnosis present

## 2024-01-11 DIAGNOSIS — F32A Depression, unspecified: Secondary | ICD-10-CM | POA: Diagnosis present

## 2024-01-11 DIAGNOSIS — N3281 Overactive bladder: Secondary | ICD-10-CM | POA: Diagnosis present

## 2024-01-11 DIAGNOSIS — I130 Hypertensive heart and chronic kidney disease with heart failure and stage 1 through stage 4 chronic kidney disease, or unspecified chronic kidney disease: Secondary | ICD-10-CM | POA: Diagnosis present

## 2024-01-11 DIAGNOSIS — K7460 Unspecified cirrhosis of liver: Secondary | ICD-10-CM | POA: Diagnosis present

## 2024-01-11 DIAGNOSIS — Z7985 Long-term (current) use of injectable non-insulin antidiabetic drugs: Secondary | ICD-10-CM

## 2024-01-11 DIAGNOSIS — M869 Osteomyelitis, unspecified: Secondary | ICD-10-CM | POA: Diagnosis present

## 2024-01-11 DIAGNOSIS — R011 Cardiac murmur, unspecified: Secondary | ICD-10-CM | POA: Diagnosis present

## 2024-01-11 DIAGNOSIS — M4714 Other spondylosis with myelopathy, thoracic region: Principal | ICD-10-CM | POA: Diagnosis present

## 2024-01-11 DIAGNOSIS — K5901 Slow transit constipation: Secondary | ICD-10-CM | POA: Diagnosis not present

## 2024-01-11 DIAGNOSIS — D638 Anemia in other chronic diseases classified elsewhere: Secondary | ICD-10-CM | POA: Diagnosis present

## 2024-01-11 DIAGNOSIS — G894 Chronic pain syndrome: Secondary | ICD-10-CM

## 2024-01-11 DIAGNOSIS — Z885 Allergy status to narcotic agent status: Secondary | ICD-10-CM

## 2024-01-11 DIAGNOSIS — N319 Neuromuscular dysfunction of bladder, unspecified: Secondary | ICD-10-CM | POA: Diagnosis present

## 2024-01-11 DIAGNOSIS — G8918 Other acute postprocedural pain: Secondary | ICD-10-CM | POA: Diagnosis present

## 2024-01-11 DIAGNOSIS — Z87442 Personal history of urinary calculi: Secondary | ICD-10-CM

## 2024-01-11 DIAGNOSIS — Z9049 Acquired absence of other specified parts of digestive tract: Secondary | ICD-10-CM

## 2024-01-11 DIAGNOSIS — R5381 Other malaise: Secondary | ICD-10-CM | POA: Diagnosis present

## 2024-01-11 DIAGNOSIS — Z981 Arthrodesis status: Secondary | ICD-10-CM | POA: Diagnosis not present

## 2024-01-11 DIAGNOSIS — Z8659 Personal history of other mental and behavioral disorders: Secondary | ICD-10-CM | POA: Diagnosis not present

## 2024-01-11 DIAGNOSIS — E7800 Pure hypercholesterolemia, unspecified: Secondary | ICD-10-CM | POA: Diagnosis present

## 2024-01-11 DIAGNOSIS — E1165 Type 2 diabetes mellitus with hyperglycemia: Secondary | ICD-10-CM | POA: Diagnosis not present

## 2024-01-11 DIAGNOSIS — F419 Anxiety disorder, unspecified: Secondary | ICD-10-CM | POA: Diagnosis present

## 2024-01-11 DIAGNOSIS — D62 Acute posthemorrhagic anemia: Secondary | ICD-10-CM | POA: Diagnosis present

## 2024-01-11 DIAGNOSIS — E119 Type 2 diabetes mellitus without complications: Secondary | ICD-10-CM

## 2024-01-11 DIAGNOSIS — R404 Transient alteration of awareness: Secondary | ICD-10-CM | POA: Diagnosis not present

## 2024-01-11 DIAGNOSIS — Z888 Allergy status to other drugs, medicaments and biological substances status: Secondary | ICD-10-CM

## 2024-01-11 DIAGNOSIS — R609 Edema, unspecified: Secondary | ICD-10-CM | POA: Diagnosis not present

## 2024-01-11 DIAGNOSIS — M25511 Pain in right shoulder: Secondary | ICD-10-CM | POA: Diagnosis present

## 2024-01-11 DIAGNOSIS — I959 Hypotension, unspecified: Secondary | ICD-10-CM | POA: Diagnosis present

## 2024-01-11 DIAGNOSIS — R339 Retention of urine, unspecified: Secondary | ICD-10-CM | POA: Diagnosis present

## 2024-01-11 DIAGNOSIS — Z87440 Personal history of urinary (tract) infections: Secondary | ICD-10-CM

## 2024-01-11 DIAGNOSIS — K219 Gastro-esophageal reflux disease without esophagitis: Secondary | ICD-10-CM | POA: Diagnosis present

## 2024-01-11 DIAGNOSIS — I447 Left bundle-branch block, unspecified: Secondary | ICD-10-CM | POA: Diagnosis present

## 2024-01-11 DIAGNOSIS — M8600 Acute hematogenous osteomyelitis, unspecified site: Secondary | ICD-10-CM

## 2024-01-11 DIAGNOSIS — Z881 Allergy status to other antibiotic agents status: Secondary | ICD-10-CM

## 2024-01-11 DIAGNOSIS — Z792 Long term (current) use of antibiotics: Secondary | ICD-10-CM

## 2024-01-11 LAB — GLUCOSE, CAPILLARY
Glucose-Capillary: 204 mg/dL — ABNORMAL HIGH (ref 70–99)
Glucose-Capillary: 230 mg/dL — ABNORMAL HIGH (ref 70–99)
Glucose-Capillary: 230 mg/dL — ABNORMAL HIGH (ref 70–99)
Glucose-Capillary: 217 mg/dL — ABNORMAL HIGH (ref 70–99)

## 2024-01-11 LAB — BASIC METABOLIC PANEL WITH GFR
Anion gap: 11 (ref 5–15)
BUN: 57 mg/dL — ABNORMAL HIGH (ref 8–23)
CO2: 26 mmol/L (ref 22–32)
Calcium: 9 mg/dL (ref 8.9–10.3)
Chloride: 97 mmol/L — ABNORMAL LOW (ref 98–111)
Creatinine, Ser: 1.86 mg/dL — ABNORMAL HIGH (ref 0.44–1.00)
GFR, Estimated: 30 mL/min — ABNORMAL LOW (ref >60)
Glucose, Bld: 231 mg/dL — ABNORMAL HIGH (ref 70–99)
Potassium: 4.3 mmol/L (ref 3.5–5.1)
Sodium: 134 mmol/L — ABNORMAL LOW (ref 135–145)

## 2024-01-11 LAB — CK: Total CK: 55 U/L (ref 38–234)

## 2024-01-11 LAB — SEDIMENTATION RATE: Sed Rate: 13 mm/h (ref 0–22)

## 2024-01-11 LAB — C-REACTIVE PROTEIN: CRP: 2.6 mg/dL — ABNORMAL HIGH (ref <1.0)

## 2024-01-11 MED ORDER — MELATONIN 5 MG PO TABS
5.0000 mg | ORAL_TABLET | Freq: Every evening | ORAL | Status: DC | PRN
  Administered 2024-01-20 – 2024-01-31 (×5): 5 mg via ORAL
  Filled 2024-01-11 (×5): qty 1

## 2024-01-11 MED ORDER — OXYCODONE HCL ER 10 MG PO T12A
10.0000 mg | EXTENDED_RELEASE_TABLET | Freq: Two times a day (BID) | ORAL | Status: DC
Start: 2024-01-11 — End: 2024-01-11

## 2024-01-11 MED ORDER — CHLORHEXIDINE GLUCONATE CLOTH 2 % EX PADS
6.0000 | MEDICATED_PAD | Freq: Every day | CUTANEOUS | Status: DC
Start: 2024-01-12 — End: 2024-01-13
  Administered 2024-01-12: 6 via TOPICAL

## 2024-01-11 MED ORDER — BISACODYL 10 MG RE SUPP: 10.0000 mg | Freq: Every day | RECTAL | Status: DC | PRN

## 2024-01-11 MED ORDER — GUAIFENESIN-DM 100-10 MG/5ML PO SYRP: 5.0000 mL | ORAL_SOLUTION | Freq: Four times a day (QID) | ORAL | Status: DC | PRN

## 2024-01-11 MED ORDER — PREGABALIN 75 MG PO CAPS
75.0000 mg | ORAL_CAPSULE | Freq: Three times a day (TID) | ORAL | Status: DC
  Administered 2024-01-11 – 2024-01-28 (×50): 75 mg via ORAL
  Filled 2024-01-11 (×24): qty 1
  Filled 2024-01-11: qty 3
  Filled 2024-01-11 (×25): qty 1

## 2024-01-11 MED ORDER — OXYCODONE HCL 5 MG PO TABS: 5.0000 mg | ORAL_TABLET | ORAL | Status: DC | PRN

## 2024-01-11 MED ORDER — SENNA 8.6 MG PO TABS
2.0000 | ORAL_TABLET | Freq: Every day | ORAL | Status: DC
  Administered 2024-01-12 – 2024-01-14 (×3): 17.2 mg via ORAL
  Filled 2024-01-11 (×3): qty 2

## 2024-01-11 MED ORDER — FLEET ENEMA RE ENEM: 1.0000 | ENEMA | Freq: Once | RECTAL | Status: DC | PRN

## 2024-01-11 MED ORDER — PROCHLORPERAZINE 25 MG RE SUPP: 12.5000 mg | Freq: Four times a day (QID) | RECTAL | Status: DC | PRN

## 2024-01-11 MED ORDER — DICLOFENAC SODIUM 1 % EX GEL
2.0000 g | Freq: Four times a day (QID) | CUTANEOUS | Status: DC
  Administered 2024-01-12 – 2024-02-01 (×71): 2 g via TOPICAL
  Filled 2024-01-11: qty 100

## 2024-01-11 MED ORDER — TORSEMIDE 20 MG PO TABS
40.0000 mg | ORAL_TABLET | Freq: Every day | ORAL | Status: DC
  Administered 2024-01-12 – 2024-01-14 (×3): 40 mg via ORAL
  Filled 2024-01-11 (×3): qty 2

## 2024-01-11 MED ORDER — INSULIN ASPART 100 UNIT/ML IJ SOLN
0.0000 [IU] | Freq: Every day | INTRAMUSCULAR | Status: DC
  Administered 2024-01-11 – 2024-01-12 (×2): 2 [IU] via SUBCUTANEOUS
  Administered 2024-01-13 – 2024-01-17 (×2): 3 [IU] via SUBCUTANEOUS
  Administered 2024-01-18 – 2024-01-26 (×4): 2 [IU] via SUBCUTANEOUS
  Administered 2024-01-28: 4 [IU] via SUBCUTANEOUS
  Administered 2024-01-31: 3 [IU] via SUBCUTANEOUS

## 2024-01-11 MED ORDER — OXYCODONE HCL 5 MG PO TABS
5.0000 mg | ORAL_TABLET | ORAL | Status: DC | PRN
  Administered 2024-01-12 – 2024-01-14 (×2): 10 mg via ORAL
  Filled 2024-01-11 (×2): qty 2

## 2024-01-11 MED ORDER — MENTHOL 3 MG MT LOZG: 1.0000 | LOZENGE | OROMUCOSAL | Status: DC | PRN

## 2024-01-11 MED ORDER — OXYCODONE HCL 5 MG PO TABS
5.0000 mg | ORAL_TABLET | Freq: Every day | ORAL | Status: DC
  Administered 2024-01-11 – 2024-01-14 (×4): 5 mg via ORAL
  Filled 2024-01-11 (×4): qty 1

## 2024-01-11 MED ORDER — SODIUM CHLORIDE 0.9% FLUSH
10.0000 mL | INTRAVENOUS | Status: DC | PRN
  Administered 2024-01-25: 10 mL

## 2024-01-11 MED ORDER — PROCHLORPERAZINE MALEATE 5 MG PO TABS: 5.0000 mg | ORAL_TABLET | Freq: Four times a day (QID) | ORAL | Status: DC | PRN

## 2024-01-11 MED ORDER — POLYETHYLENE GLYCOL 3350 17 G PO PACK
17.0000 g | PACK | Freq: Every day | ORAL | Status: DC
  Administered 2024-01-12 – 2024-01-31 (×14): 17 g via ORAL
  Filled 2024-01-11 (×19): qty 1

## 2024-01-11 MED ORDER — ADULT MULTIVITAMIN W/MINERALS CH
1.0000 | ORAL_TABLET | Freq: Every day | ORAL | Status: DC
  Administered 2024-01-12 – 2024-02-01 (×21): 1 via ORAL
  Filled 2024-01-11 (×21): qty 1

## 2024-01-11 MED ORDER — SODIUM CHLORIDE 0.9% FLUSH
10.0000 mL | Freq: Two times a day (BID) | INTRAVENOUS | Status: DC
  Administered 2024-01-11: 10 mL
  Administered 2024-01-12 – 2024-01-13 (×2): 20 mL
  Administered 2024-01-13 – 2024-02-01 (×34): 10 mL

## 2024-01-11 MED ORDER — ACETAMINOPHEN 325 MG PO TABS
325.0000 mg | ORAL_TABLET | ORAL | Status: DC | PRN
  Administered 2024-01-12 – 2024-01-14 (×3): 650 mg via ORAL
  Filled 2024-01-11 (×3): qty 2

## 2024-01-11 MED ORDER — PHENOL 1.4 % MT LIQD: 1.0000 | OROMUCOSAL | Status: DC | PRN

## 2024-01-11 MED ORDER — DAPTOMYCIN-SODIUM CHLORIDE 500-0.9 MG/50ML-% IV SOLN
8.0000 mg/kg | Freq: Every day | INTRAVENOUS | Status: DC
  Administered 2024-01-12 – 2024-01-16 (×5): 500 mg via INTRAVENOUS
  Filled 2024-01-11 (×6): qty 50

## 2024-01-11 MED ORDER — POLYETHYLENE GLYCOL 3350 17 G PO PACK
17.0000 g | PACK | Freq: Every day | ORAL | Status: DC
  Administered 2024-01-11: 17 g via ORAL
  Filled 2024-01-11: qty 1

## 2024-01-11 MED ORDER — METHOCARBAMOL 500 MG PO TABS
500.0000 mg | ORAL_TABLET | Freq: Four times a day (QID) | ORAL | Status: DC | PRN
  Administered 2024-01-27 – 2024-02-01 (×4): 500 mg via ORAL
  Filled 2024-01-11 (×4): qty 1

## 2024-01-11 MED ORDER — ALUM & MAG HYDROXIDE-SIMETH 200-200-20 MG/5ML PO SUSP: 30.0000 mL | ORAL | Status: DC | PRN

## 2024-01-11 MED ORDER — PROCHLORPERAZINE EDISYLATE 10 MG/2ML IJ SOLN
5.0000 mg | Freq: Four times a day (QID) | INTRAMUSCULAR | Status: DC | PRN
  Filled 2024-01-11: qty 2

## 2024-01-11 MED ORDER — ENOXAPARIN SODIUM 40 MG/0.4ML IJ SOSY
40.0000 mg | PREFILLED_SYRINGE | INTRAMUSCULAR | Status: DC
  Administered 2024-01-11 – 2024-01-31 (×21): 40 mg via SUBCUTANEOUS
  Filled 2024-01-11 (×21): qty 0.4

## 2024-01-11 MED ORDER — DULOXETINE HCL 60 MG PO CPEP
60.0000 mg | ORAL_CAPSULE | Freq: Every day | ORAL | Status: DC
  Administered 2024-01-11 – 2024-01-31 (×21): 60 mg via ORAL
  Filled 2024-01-11 (×21): qty 1

## 2024-01-11 MED ORDER — MORPHINE SULFATE 15 MG PO TABS: 15.0000 mg | ORAL_TABLET | ORAL | Status: DC | PRN

## 2024-01-11 MED ORDER — INSULIN ASPART 100 UNIT/ML IJ SOLN
0.0000 [IU] | Freq: Three times a day (TID) | INTRAMUSCULAR | Status: DC
  Administered 2024-01-12: 11 [IU] via SUBCUTANEOUS
  Administered 2024-01-12: 3 [IU] via SUBCUTANEOUS
  Administered 2024-01-12: 7 [IU] via SUBCUTANEOUS
  Administered 2024-01-13: 11 [IU] via SUBCUTANEOUS
  Administered 2024-01-13 (×2): 4 [IU] via SUBCUTANEOUS
  Administered 2024-01-14 (×2): 11 [IU] via SUBCUTANEOUS
  Administered 2024-01-14: 4 [IU] via SUBCUTANEOUS
  Administered 2024-01-15: 7 [IU] via SUBCUTANEOUS
  Administered 2024-01-15: 4 [IU] via SUBCUTANEOUS
  Administered 2024-01-15: 7 [IU] via SUBCUTANEOUS
  Administered 2024-01-16: 11 [IU] via SUBCUTANEOUS
  Administered 2024-01-16: 4 [IU] via SUBCUTANEOUS
  Administered 2024-01-16: 11 [IU] via SUBCUTANEOUS
  Administered 2024-01-17: 4 [IU] via SUBCUTANEOUS
  Administered 2024-01-17 – 2024-01-18 (×2): 7 [IU] via SUBCUTANEOUS
  Administered 2024-01-18: 4 [IU] via SUBCUTANEOUS
  Administered 2024-01-18: 7 [IU] via SUBCUTANEOUS
  Administered 2024-01-19 (×2): 4 [IU] via SUBCUTANEOUS
  Administered 2024-01-19: 7 [IU] via SUBCUTANEOUS
  Administered 2024-01-20: 4 [IU] via SUBCUTANEOUS
  Administered 2024-01-20 – 2024-01-21 (×3): 7 [IU] via SUBCUTANEOUS
  Administered 2024-01-21: 4 [IU] via SUBCUTANEOUS
  Administered 2024-01-21: 11 [IU] via SUBCUTANEOUS
  Administered 2024-01-22: 4 [IU] via SUBCUTANEOUS
  Administered 2024-01-22 (×2): 7 [IU] via SUBCUTANEOUS
  Administered 2024-01-23: 11 [IU] via SUBCUTANEOUS
  Administered 2024-01-23: 7 [IU] via SUBCUTANEOUS
  Administered 2024-01-23: 4 [IU] via SUBCUTANEOUS
  Administered 2024-01-24: 7 [IU] via SUBCUTANEOUS
  Administered 2024-01-24: 11 [IU] via SUBCUTANEOUS
  Administered 2024-01-24: 4 [IU] via SUBCUTANEOUS
  Administered 2024-01-25 (×2): 7 [IU] via SUBCUTANEOUS
  Administered 2024-01-25: 4 [IU] via SUBCUTANEOUS
  Administered 2024-01-26 (×3): 7 [IU] via SUBCUTANEOUS
  Administered 2024-01-27: 11 [IU] via SUBCUTANEOUS
  Administered 2024-01-27: 4 [IU] via SUBCUTANEOUS
  Administered 2024-01-27: 11 [IU] via SUBCUTANEOUS
  Administered 2024-01-28: 7 [IU] via SUBCUTANEOUS
  Administered 2024-01-28: 11 [IU] via SUBCUTANEOUS
  Administered 2024-01-28: 4 [IU] via SUBCUTANEOUS
  Administered 2024-01-29: 3 [IU] via SUBCUTANEOUS
  Administered 2024-01-29 (×2): 4 [IU] via SUBCUTANEOUS
  Administered 2024-01-30: 7 [IU] via SUBCUTANEOUS
  Administered 2024-01-30: 3 [IU] via SUBCUTANEOUS
  Administered 2024-01-31: 7 [IU] via SUBCUTANEOUS
  Administered 2024-01-31: 4 [IU] via SUBCUTANEOUS
  Administered 2024-01-31: 7 [IU] via SUBCUTANEOUS
  Administered 2024-02-01: 4 [IU] via SUBCUTANEOUS
  Filled 2024-01-11: qty 10

## 2024-01-11 MED ORDER — INSULIN GLARGINE-YFGN 100 UNIT/ML ~~LOC~~ SOLN
20.0000 [IU] | Freq: Every day | SUBCUTANEOUS | Status: DC
  Administered 2024-01-12 – 2024-01-14 (×3): 20 [IU] via SUBCUTANEOUS
  Filled 2024-01-11 (×3): qty 0.2

## 2024-01-11 MED ORDER — DIPHENHYDRAMINE HCL 25 MG PO CAPS: 25.0000 mg | ORAL_CAPSULE | Freq: Four times a day (QID) | ORAL | Status: DC | PRN

## 2024-01-11 MED ORDER — SODIUM CHLORIDE 0.9 % IV SOLN
1.0000 g | Freq: Every day | INTRAVENOUS | Status: DC
  Administered 2024-01-11: 1 g via INTRAVENOUS

## 2024-01-11 MED ORDER — MIRTAZAPINE 15 MG PO TABS
30.0000 mg | ORAL_TABLET | Freq: Every day | ORAL | Status: DC
  Administered 2024-01-11 – 2024-01-31 (×21): 30 mg via ORAL
  Filled 2024-01-11 (×21): qty 2

## 2024-01-11 MED ORDER — PRAMIPEXOLE DIHYDROCHLORIDE 1 MG PO TABS
1.0000 mg | ORAL_TABLET | Freq: Every day | ORAL | Status: DC
  Administered 2024-01-11 – 2024-01-31 (×21): 1 mg via ORAL
  Filled 2024-01-11 (×22): qty 1

## 2024-01-11 MED ORDER — VALACYCLOVIR HCL 500 MG PO TABS
1000.0000 mg | ORAL_TABLET | Freq: Two times a day (BID) | ORAL | Status: AC
  Administered 2024-01-11 – 2024-01-18 (×14): 1000 mg via ORAL
  Filled 2024-01-11 (×14): qty 2

## 2024-01-11 MED ORDER — POLYETHYLENE GLYCOL 3350 17 G PO PACK
32.0000 g | PACK | Freq: Once | ORAL | Status: DC
  Filled 2024-01-11: qty 2

## 2024-01-11 MED ORDER — SODIUM CHLORIDE 0.9 % IV SOLN
1.0000 g | Freq: Every day | INTRAVENOUS | Status: DC
  Administered 2024-01-12 – 2024-01-16 (×5): 1 g via INTRAVENOUS
  Filled 2024-01-11 (×6): qty 1000

## 2024-01-11 NOTE — Progress Notes (Signed)
 [name], DO  Physician Physical Medicine and Rehabilitation   PMR Pre-admission    Signed   Date of Service: 01/10/2024  3:41 PM  Related encounter: ED to Hosp-Admission (Current) from 01/07/2024 in [facility]   Signed     Expand All Collapse All  PMR Admission Coordinator Pre-Admission Assessment   Patient: Katelyn [name] is an 68 y.o., female MRN: [id number] DOB: 1955-12-30 Height: 5\' 1"  (154.9 cm) Weight: 90.7 kg                                                                                                                                                  Insurance Information HMO:  yes   PPO:     PCP:      IPA:      80/20:      OTHER:  PRIMARY: [employer] PPO      Policy#: [id number]      Subscriber: pt CM Name: Babette Relic      Phone#: [phone]     Fax#: epic access Pre-Cert#: [id number] auth for CIR from Tammy with [employer] with updates due weekly, they have epic access.       Employer:  Benefits:  Phone #: [phone]     Name:  Eff. Date: 10/10/23     Deduct: $0      Out of Pocket Max: $3500 (met $225.67)      Life Max: n/a  CIR: $225/day for days 1-6      SNF: 20 full days Outpatient:      Co-Pay: $15/visit Home Health: 80%      Co-Pay: 20% DME: 80%     Co-Pay: 20% Providers:  SECONDARY:       Policy#:       Phone#:    Artist:       Phone#:    The Data processing manager" for patients in Inpatient Rehabilitation Facilities with attached "Privacy Act Statement-Health Care Records" was provided and verbally reviewed with: Patient and Family   Emergency Contact Information Contact Information       Name Relation Home Work Mobile    Ayuso,Jerry Spouse     [phone]         Other Contacts   None on File      Current Medical History  Patient Admitting Diagnosis: thoracic myelopathy    History of Present Illness: Pt is a 68 y/o female with PMH of multiple spinal surgeries, DM, obesity, OSA,  LBBB, HTN, HLD, CKD, and CAD admitted to [facility] per neurosurgery MD recommendation on 01/07/24 due to R foot drop and LE weakness starting 01/03/24.  Endorses burning sensation in her thoracic area, denies N/T in the legs.  Initial motor exam demos BLE strength no greater than 2/5.  Pt underwent multiple CT scans and  CT myelogram which showed significant stenosis of the T10 level where she has sequelae of discitis.  Neurosurgery recommended surgical intervention and she underwent a decompressive thoracic laminectomy, medial facetectomy, and foraminotomies of T9-10, removal/re-do hardware T10-L1, and posterior fixation of T7-L1 per Dr. Yetta Barre on 01/09/24.  Consult to ID given prior postop infection with Serratia marcescens and discitis/osteomyelitis with epidural phlegmon with IV abx.  Hospital course urinary retention, constipation, ABLA, and supplemental O2 needs.  Therapy evaluations completed and pt was recommended for CIR due to functional decline as well as bowel/bladder needs.  Glasgow Coma Scale Score: 14   Patient's medical record from [facility] has been reviewed by the rehabilitation admission coordinator and physician.   Past Medical History      Past Medical History:  Diagnosis Date   Anxiety     Arthritis     Bursitis of right hip     CAD (coronary artery disease)      Cardiac catheterization June 2014 in [place] - 50% circumflex stenosis   Chest pain, neg MI, stable CAD non obstructive on cath 10/05/20 10/04/2020   Chronic diastolic heart failure (HCC) 08/20/2017   Chronic kidney disease, stage 3 (HCC)      does not see nephrologist   Chronic low back pain without sciatica 03/14/2016   Cirrhosis of liver (HCC)     CKD (chronic kidney disease), stage III (HCC) 10/07/2020   Complication of anesthesia     Cough 04/19/2017    Overview:  Last Assessment & Plan:  Formatting of this note may be different from the original. Cough - ? ACE related with AR triggers   Plan  Patient  Instructions  Discuss with your primary doctor that lisinopril pain, need making your cough worse. May use Mucinex DM twice daily as needed for cough and congestion Zyrtec 10 mg at bedtime as needed for drainage Saline nasal spray as needed. Lab tests today Activity as tolerated. Follow with Dr. Craige Cotta in 3-4 months and As needed   Please contact office for sooner follow up if symptoms do not improve or worsen or seek emergency care    Depression     Dyspnea      with exertion   " lazy lung" - per  Dr Craige Cotta from back issues- 06/2016   Elevated liver enzymes 12/05/2016   Essential hypertension     GERD (gastroesophageal reflux disease)     Gout 03/14/2016   Greater trochanteric bursitis of right hip 02/02/2012   H/O hiatal hernia     Heart murmur     History of blood transfusion 2016   History of esophageal stricture 10/07/2020   History of kidney stones     Hypercholesterolemia     Hypertensive heart disease with heart failure (HCC) 01/01/2017   Hypoxia 10/07/2020   Iliotibial band syndrome of right side 02/02/2012   Iron deficiency anemia due to chronic blood loss 03/14/2016   LBBB (left bundle branch block) 01/01/2017   Left bundle branch block     Leg weakness 10/07/2020   Lumbar stenosis     Meralgia paraesthetica 12/05/2016   Mild CAD 11/24/2015   Morbid obesity (HCC) 10/07/2020   Neuropathy     OSA (obstructive sleep apnea) 05/24/2016    Overview:  Managed PULM- no CPAP   PONV (postoperative nausea and vomiting)      "no N/V with patch"   Restless leg syndrome     S/P lumbar laminectomy 11/26/2015   S/P lumbar spinal fusion  08/29/2016   Tinnitus 12/05/2016   Type 2 diabetes mellitus (HCC)     UTI (urinary tract infection) 10/07/2020          Has the patient had major surgery during 100 days prior to admission? Yes   Family History  family history includes Asthma in her sister; Bone cancer in her sister; Heart failure in her mother; Hypertension in her father; Lung cancer  in her father; Stroke in her father.     Current Medications   Current Medications    Current Facility-Administered Medications:    0.9 %  sodium chloride infusion, 250 mL, Intravenous, Continuous, Yetta Barre Thomes Dinning, MD   0.9 % NaCl with KCl 20 mEq/ L  infusion, , Intravenous, Continuous, Arman Bogus, MD, Stopped at 01/10/24 [phone]   acetaminophen (TYLENOL) tablet 1,000 mg, 1,000 mg, Oral, Q6H, Arman Bogus, MD, 1,000 mg at 01/10/24 1141   celecoxib (CELEBREX) capsule 200 mg, 200 mg, Oral, Q12H, Arman Bogus, MD, 200 mg at 01/10/24 1191   Chlorhexidine Gluconate Cloth 2 % PADS 6 each, 6 each, Topical, Daily, Arman Bogus, MD, 6 each at 01/10/24 1405   ciprofloxacin (CIPRO) tablet 500 mg, 500 mg, Oral, BID, Arman Bogus, MD, 500 mg at 01/10/24 4782   doxycycline (VIBRA-TABS) tablet 100 mg, 100 mg, Oral, BID, Arman Bogus, MD, 100 mg at 01/10/24 0912   DULoxetine (CYMBALTA) DR capsule 60 mg, 60 mg, Oral, QHS, Arman Bogus, MD, 60 mg at 01/09/24 2226   insulin aspart (novoLOG) injection 0-20 Units, 0-20 Units, Subcutaneous, TID WC, Bergman, Meghan D, NP   insulin glargine-yfgn (SEMGLEE) injection 20 Units, 20 Units, Subcutaneous, Daily, Bergman, Meghan D, NP   menthol-cetylpyridinium (CEPACOL) lozenge 3 mg, 1 lozenge, Oral, PRN **OR** phenol (CHLORASEPTIC) mouth spray 1 spray, 1 spray, Mouth/Throat, PRN, Arman Bogus, MD   methocarbamol (ROBAXIN) tablet 500 mg, 500 mg, Oral, Q6H PRN, 500 mg at 01/10/24 0018 **OR** methocarbamol (ROBAXIN) injection 500 mg, 500 mg, Intravenous, Q6H PRN, Arman Bogus, MD   mirtazapine (REMERON) tablet 30 mg, 30 mg, Oral, QHS, Arman Bogus, MD, 30 mg at 01/09/24 2225   morphine (PF) 2 MG/ML injection 2 mg, 2 mg, Intravenous, Q2H PRN, Arman Bogus, MD, 2 mg at 01/10/24 0017   multivitamin with minerals tablet 1 tablet, 1 tablet, Oral, Daily, Arman Bogus, MD, 1 tablet at 01/10/24 0912    ondansetron (ZOFRAN) tablet 4 mg, 4 mg, Oral, Q6H PRN **OR** ondansetron (ZOFRAN) injection 4 mg, 4 mg, Intravenous, Q6H PRN, Arman Bogus, MD   oxyCODONE (Oxy IR/ROXICODONE) immediate release tablet 5 mg, 5 mg, Oral, Q3H PRN, Arman Bogus, MD, 5 mg at 01/10/24 1150   pramipexole (MIRAPEX) tablet 1 mg, 1 mg, Oral, QHS, Arman Bogus, MD, 1 mg at 01/09/24 2226   pregabalin (LYRICA) capsule 75 mg, 75 mg, Oral, BID, Arman Bogus, MD, 75 mg at 01/10/24 9562   senna (SENOKOT) tablet 8.6 mg, 1 tablet, Oral, BID, Arman Bogus, MD, 8.6 mg at 01/10/24 0912   sodium chloride flush (NS) 0.9 % injection 3 mL, 3 mL, Intravenous, Q12H, Arman Bogus, MD, 3 mL at 01/09/24 2227   sodium chloride flush (NS) 0.9 % injection 3 mL, 3 mL, Intravenous, PRN, Arman Bogus, MD   torsemide [facility]) tablet 40 mg, 40 mg, Oral, Daily, Arman Bogus, MD, 40 mg at 01/10/24 1308     Patients Current Diet:  Diet Order  Diet Carb Modified Fluid consistency: Thin; Room service appropriate? Yes  Diet effective now                         Precautions / Restrictions Precautions Precautions: Fall Spinal Brace: Thoracolumbosacral orthotic, Applied in sitting position Restrictions Weight Bearing Restrictions Per Provider Order: No    Has the patient had 2 or more falls or a fall with injury in the past year?Yes   Prior Activity Level Limited Community (1-2x/wk): mod I with RW prior to Friday 3/28, able to access her community with bilat platform RW, RW in the home, able to garden, and complete mobility and adls without assist (confirmed with pt and spouse)   Prior Functional Level Prior Function Prior Level of Function : Needs assist Mobility Comments: using a w/c since last Friday 2/2 increasing LE weakness ADLs Comments: Husband assist with transfers & LB ADLs   Self Care: Did the patient need help bathing, dressing, using the toilet or eating?   Independent   Indoor Mobility: Did the patient need assistance with walking from room to room (with or without device)? Independent   Stairs: Did the patient need assistance with internal or external stairs (with or without device)? Needed some help   Functional Cognition: Did the patient need help planning regular tasks such as shopping or remembering to take medications? Independent   Patient Information Are you of Hispanic, Latino/a,or Spanish origin?: A. No, not of Hispanic, Latino/a, or Spanish origin What is your race?: A. White Do you need or want an interpreter to communicate with a doctor or health care staff?: 0. No   Patient's Response To:  Health Literacy and Transportation Is the patient able to respond to health literacy and transportation needs?: Yes Health Literacy - How often do you need to have someone help you when you read instructions, pamphlets, or other written material from your doctor or pharmacy?: Never In the past 12 months, has lack of transportation kept you from medical appointments or from getting medications?: No In the past 12 months, has lack of transportation kept you from meetings, work, or from getting things needed for daily living?: No   Home Assistive Devices / Equipment Home Equipment: Agricultural consultant (2 wheels), Rollator (4 wheels), Wheelchair - manual, Shower seat, Grab bars - tub/shower, BSC/3in1, Hand held shower head, Adaptive equipment, Other (comment)   Prior Device Use: Indicate devices/aids used by the patient prior to current illness, exacerbation or injury? Walker   Current Functional Level Cognition   Orientation Level: Oriented to situation, Disoriented to place    Extremity Assessment (includes Sensation/Coordination)   Upper Extremity Assessment: Defer to OT evaluation RUE Deficits / Details: Per pt previous R shoulder surgery RUE Sensation: WNL RUE Coordination:  (WFL)  Lower Extremity Assessment: LLE deficits/detail, RLE  deficits/detail RLE Deficits / Details: 3+/5 knee extension, 3/5 DF/PF. hip flexion mostly achieved throughout PF and knee flexion in standing, but + contraction RLE Sensation: decreased light touch (distally) LLE Deficits / Details: 4/5 knee extension, at least 3/5 DF/PF LLE Sensation: decreased light touch (distally)     ADLs   Overall ADL's : Needs assistance/impaired Eating/Feeding: Set up, Bed level Grooming: Set up, Bed level Upper Body Bathing: Set up, Bed level Lower Body Bathing: Total assistance, Bed level Upper Body Dressing : Set up, Bed level Lower Body Dressing: Total assistance, Sitting/lateral leans Lower Body Dressing Details (indicate cue type and reason): Per pt assist at baseline  Mobility   Overal bed mobility: Needs Assistance Bed Mobility: Rolling, Sidelying to Sit, Sit to Sidelying Rolling: Mod assist Sidelying to sit: Mod assist, +2 for physical assistance Supine to sit: Max assist, HOB elevated, Used rails Sit to supine: Max assist, HOB elevated, Used rails Sit to sidelying: Mod assist, +2 for physical assistance General bed mobility comments: assist for trunk and LE management, use of log roll technique to perform     Transfers   Overall transfer level: Needs assistance Equipment used: Rolling walker (2 wheels) Transfers: Sit to/from Stand Sit to Stand: Min assist, +2 physical assistance, From elevated surface General transfer comment: assist for completion of rise with facilitation through sacrum, pt with good initiation of power up. Locks RLE out into extension to prevent buckling     Ambulation / Gait / Stairs / Clinical biochemist / Balance Dynamic Sitting Balance Sitting balance - Comments: fair to poor, with posterior bias with fatigue Balance Overall balance assessment: Needs assistance Sitting-balance support: Bilateral upper extremity supported, Feet supported Sitting balance-Leahy Scale: Poor Sitting balance - Comments:  fair to poor, with posterior bias with fatigue Postural control: Posterior lean Standing balance support: Bilateral upper extremity supported, Reliant on assistive device for balance Standing balance-Leahy Scale: Poor     Special needs/care consideration Oxygen acutely, 2L Marcus, Skin surgical incision to back, and Diabetic management yes        Previous Home Environment (from acute therapy documentation) Living Arrangements: Spouse/significant other Available Help at Discharge: Family, Available 24 hours/day Type of Home: House Home Layout: One level Home Access: Ramped entrance Bathroom Shower/Tub: Health visitor: Handicapped height Bathroom Accessibility: Yes How Accessible: Accessible via walker Home Care Services: No   Discharge Living Setting Plans for Discharge Living Setting: Patient's home, Lives with (comment) (spouse) Type of Home at Discharge: House Discharge Home Layout: One level Discharge Home Access: Ramped entrance Discharge Bathroom Shower/Tub: Walk-in shower Discharge Bathroom Toilet: Handicapped height Discharge Bathroom Accessibility: Yes How Accessible: Accessible via walker Does the patient have any problems obtaining your medications?: No   Social/Family/Support Systems Patient Roles: Spouse Anticipated Caregiver: Marlee Armenteros (spouse) Anticipated Caregiver's Contact Information: [phone] Ability/Limitations of Caregiver: none stated, can provide 24/7 if needed Caregiver Availability: 24/7 Discharge Plan Discussed with Primary Caregiver: Yes Is Caregiver In Agreement with Plan?: Yes     Goals Patient/Family Goal for Rehab: PT/OT supervision to min A, SLP n/a Expected length of stay: 9-12 days Additional Information: Discharge plan: discharge home with spouse available 24/7. Pt/Family Agrees to Admission and willing to participate: Yes Program Orientation Provided & Reviewed with Pt/Caregiver Including Roles  & Responsibilities:  Yes     Decrease burden of Care through IP rehab admission: n/a     Possible need for SNF placement upon discharge: Not anticipated.  Plan for discharge home with 24/7 support from her spouse.      Patient Condition: This patient's condition remains as documented in the consult dated 01/10/24, in which the Rehabilitation Physician determined and documented that the patient's condition is appropriate for intensive rehabilitative care in an inpatient rehabilitation facility. Will admit to inpatient rehab today.   Preadmission Screen Completed By:  Stephania Fragmin, PT, DPT 01/10/2024 4:05 PM ______________________________________________________________________   Discussed status with Dr. Shearon Stalls on 01/11/24 at 2:07 PM  and received approval for admission today.   Admission Coordinator:  Stephania Fragmin, PT, DPT time2:07 Delight Stare Dorna Bloom 01/11/24  Revision History

## 2024-01-11 NOTE — Progress Notes (Signed)
 Subjective: Patient reports doing well, seeing more movement in her legs every day   Objective: Vital signs in last 24 hours: Temp:  [98 F (36.7 C)-99 F (37.2 C)] 98.1 F (36.7 C) (04/04 0735) Pulse Rate:  [71-90] 73 (04/04 0735) Resp:  [16-17] 16 (04/04 0735) BP: (134-166)/(57-65) 166/61 (04/04 0735) SpO2:  [97 %-100 %] 97 % (04/04 0735)  Intake/Output from previous day: 04/03 0701 - 04/04 0700 In: 715 [P.O.:712; I.V.:3] Out: 2805 [Urine:2525; Drains:280] Intake/Output this shift: No intake/output data recorded.  Neuro: dorsiflexion of right foot 4/5, dorsiflexion left foot 4+/5, left hip flexor 2/5, left knee extensor 3/5  Lab Results: Lab Results  Component Value Date   WBC 8.2 01/07/2024   HGB 8.8 (L) 01/09/2024   HCT 26.0 (L) 01/09/2024   MCV 89.9 01/07/2024   PLT 191 01/07/2024   Lab Results  Component Value Date   INR 1.1 01/01/2024   BMET Lab Results  Component Value Date   NA 136 01/09/2024   K 4.4 01/09/2024   CL 99 01/07/2024   CO2 28 01/07/2024   GLUCOSE 200 (H) 01/07/2024   BUN 41 (H) 01/07/2024   CREATININE 1.33 (H) 01/10/2024   CALCIUM 9.2 01/07/2024    Studies/Results: DG Thoracic Spine 2 View Result Date: 01/09/2024 CLINICAL DATA:  Elective surgery. EXAM: THORACIC SPINE 2 VIEWS COMPARISON:  Preoperative imaging FINDINGS: Five fluoroscopic spot views from the operating room submitted. Portions of thoracolumbar fusion hardware visualized, now extending cranially into the T7 level. Fluoroscopy time 74.5 seconds. Dose 26.05 mGy. Portions of a spinal stimulator are seen. IMPRESSION: Intraoperative fluoroscopy during thoracic spine surgery. Electronically Signed   By: Narda Rutherford M.D.   On: 01/09/2024 18:20   DG C-Arm 1-60 Min-No Report Result Date: 01/09/2024 Fluoroscopy was utilized by the requesting physician.  No radiographic interpretation.   DG C-Arm 1-60 Min-No Report Result Date: 01/09/2024 Fluoroscopy was utilized by the requesting  physician.  No radiographic interpretation.   DG C-Arm 1-60 Min-No Report Result Date: 01/09/2024 Fluoroscopy was utilized by the requesting physician.  No radiographic interpretation.    Assessment/Plan: Postop day 2 thoracic decompression and extension of fusion. Doing well, making small improvements in strength every day    LOS: 4 days    Katelyn [name] 01/11/2024, 10:36 AM

## 2024-01-11 NOTE — Progress Notes (Signed)
 [name], DO  Physician Physical Medicine and Rehabilitation   Consult Note    Signed   Date of Service: 01/10/2024  1:54 PM  Related encounter: ED to Hosp-Admission (Current) from 01/07/2024 in [facility]   Signed     Expand All Collapse All           Physical Medicine and Rehabilitation Consult Reason for Consult: Evaluate appropriateness for Inpatient Rehab Referring Physician: Dr.  Yetta Barre        HPI: Katelyn [name] is a 68 y.o. female with PMHx of  has a past medical history of Anxiety, Arthritis, Bursitis of right hip, CAD (coronary artery disease), Chest pain, neg MI, stable CAD non obstructive on cath 10/05/20 (10/04/2020), Chronic diastolic heart failure (HCC) (16/07/9603), Chronic kidney disease, stage 3 (HCC), Chronic low back pain without sciatica (03/14/2016), Cirrhosis of liver (HCC), CKD (chronic kidney disease), stage III (HCC) (10/07/2020), Complication of anesthesia, Cough (04/19/2017), Depression, Dyspnea, Elevated liver enzymes (12/05/2016), Essential hypertension, GERD (gastroesophageal reflux disease), Gout (03/14/2016), Greater trochanteric bursitis of right hip (02/02/2012), H/O hiatal hernia, Heart murmur, History of blood transfusion (2016), History of esophageal stricture (10/07/2020), History of kidney stones, Hypercholesterolemia, Hypertensive heart disease with heart failure (HCC) (01/01/2017), Hypoxia (10/07/2020), Iliotibial band syndrome of right side (02/02/2012), Iron deficiency anemia due to chronic blood loss (03/14/2016), LBBB (left bundle branch block) (01/01/2017), Left bundle branch block, Leg weakness (10/07/2020), Lumbar stenosis, Meralgia paraesthetica (12/05/2016), Mild CAD (11/24/2015), Morbid obesity (HCC) (10/07/2020), Neuropathy, OSA (obstructive sleep apnea) (05/24/2016), PONV (postoperative nausea and vomiting), Restless leg syndrome, S/P lumbar laminectomy (11/26/2015), S/P lumbar spinal fusion  (08/29/2016), Tinnitus (12/05/2016), Type 2 diabetes mellitus (HCC), and UTI (urinary tract infection) (10/07/2020). . They were admitted to [facility] on 01/07/2024 for right greater than left lower extremity weakness and decreased sensation in her legs..  Of note, she had prior T10-L2 posterior fixation and removal of spinal cord stimulator in December, and recently developed foot drop and suspected infection  at T9-10, for which she has seen neurosurgery as an outpatient on 3-28 and who advised her over the phone to go to the ED on 3-31.  Myelogram on 4-1 shows significant stenosis at the T10 level with sequela of discitis.  On 4-2, she went underwent T7-L2 fusion and replacement of T10, L1, and T12 screws. Intraoperative cultures showed NGTD.  Postop course was uncomplicated.  PM&R was consulted to evaluate appropriateness for IPR admission.      According to patient and her husband, the patient lives in a private residence with a ramped entrance in a single level, and is generally Ind for short distances and for long distances Mod I with a rollator; she has needed to use a platform walker and a wheelchair only over the week prior to hospitalization.  She is independent of ADLs at baseline; is Mod I for donning compression socks and needs assistance with driving only. Husband is able to provide 24/7 physical assistance on return to home.     Review of Systems  Constitutional:  Negative for chills and fever.  Respiratory:  Negative for cough, hemoptysis and shortness of breath.   Cardiovascular:  Negative for chest pain and palpitations.  Gastrointestinal:  Positive for abdominal pain and constipation. Negative for diarrhea, nausea and vomiting.  Genitourinary:  Positive for urgency. Negative for dysuria.  Musculoskeletal:  Positive for back pain and falls.  Neurological:  Positive for sensory change and focal weakness. Negative for dizziness  and headaches.  Psychiatric/Behavioral:  The  patient is nervous/anxious.         Past Medical History:  Diagnosis Date   Anxiety     Arthritis     Bursitis of right hip     CAD (coronary artery disease)      Cardiac catheterization June 2014 in [place] - 50% circumflex stenosis   Chest pain, neg MI, stable CAD non obstructive on cath 10/05/20 10/04/2020   Chronic diastolic heart failure (HCC) 08/20/2017   Chronic kidney disease, stage 3 (HCC)      does not see nephrologist   Chronic low back pain without sciatica 03/14/2016   Cirrhosis of liver (HCC)     CKD (chronic kidney disease), stage III (HCC) 10/07/2020   Complication of anesthesia     Cough 04/19/2017    Overview:  Last Assessment & Plan:  Formatting of this note may be different from the original. Cough - ? ACE related with AR triggers   Plan  Patient Instructions  Discuss with your primary doctor that lisinopril pain, need making your cough worse. May use Mucinex DM twice daily as needed for cough and congestion Zyrtec 10 mg at bedtime as needed for drainage Saline nasal spray as needed. Lab tests today Activity as tolerated. Follow with Dr. Craige Cotta in 3-4 months and As needed   Please contact office for sooner follow up if symptoms do not improve or worsen or seek emergency care    Depression     Dyspnea      with exertion   " lazy lung" - per  Dr Craige Cotta from back issues- 06/2016   Elevated liver enzymes 12/05/2016   Essential hypertension     GERD (gastroesophageal reflux disease)     Gout 03/14/2016   Greater trochanteric bursitis of right hip 02/02/2012   H/O hiatal hernia     Heart murmur     History of blood transfusion 2016   History of esophageal stricture 10/07/2020   History of kidney stones     Hypercholesterolemia     Hypertensive heart disease with heart failure (HCC) 01/01/2017   Hypoxia 10/07/2020   Iliotibial band syndrome of right side 02/02/2012   Iron deficiency anemia due to chronic blood loss 03/14/2016   LBBB (left bundle branch block)  01/01/2017   Left bundle branch block     Leg weakness 10/07/2020   Lumbar stenosis     Meralgia paraesthetica 12/05/2016   Mild CAD 11/24/2015   Morbid obesity (HCC) 10/07/2020   Neuropathy     OSA (obstructive sleep apnea) 05/24/2016    Overview:  Managed PULM- no CPAP   PONV (postoperative nausea and vomiting)      "no N/V with patch"   Restless leg syndrome     S/P lumbar laminectomy 11/26/2015   S/P lumbar spinal fusion 08/29/2016   Tinnitus 12/05/2016   Type 2 diabetes mellitus (HCC)     UTI (urinary tract infection) 10/07/2020             Past Surgical History:  Procedure Laterality Date   ABDOMINAL HYSTERECTOMY   1983   APPENDECTOMY   Age 89   BACK SURGERY        FIRST LUMBAR FUSION/ SURGERY APRIL 2012 AND FUSION WITH INSTRUMENTATION SEPT 2012   CARDIAC CATHETERIZATION        x 2   CARPAL TUNNEL RELEASE        bil   CHOLECYSTECTOMY   1990's   COLONOSCOPY  12/12/2017    Colonic polyp status post polypectomy. Mild sigmoid diverticulosis. Otherwise normal colonoscopy to terminal ileum.   CYSTO EXTRACTION KIDNEY STONES       ESOPHAGOGASTRODUODENOSCOPY   12/12/2017    Small hiatal hernia. Mild gastritis. Status post esophageal dilatation.   EXCISION/RELEASE BURSA HIP   02/02/2012    Procedure: EXCISION/RELEASE BURSA HIP;  Surgeon: Jacki Cones, MD;  Location: [facility] ORS;  Service: Orthopedics;  Laterality: Right;  Right Hip Bursectomy   EYE SURGERY Bilateral      cataracts   IR THORACIC DISC ASPIRATION W/IMG GUIDE   02/14/2023   KIDNEY STONE SURGERY   2008   LAMINECTOMY WITH POSTERIOR LATERAL ARTHRODESIS LEVEL 4 N/A 02/19/2023    Procedure: THORACIC TEN-LUMBAR TWO INSTRUMENTED FUSION;  Surgeon: Tia Alert, MD;  Location: [facility] OR;  Service: Neurosurgery;  Laterality: N/A;   Left knee surgery x 2   1996    reconstruction   LUMBAR DISC SURGERY   08/2016   LUMBAR LAMINECTOMY/DECOMPRESSION MICRODISCECTOMY Right 11/26/2015    Procedure: Extraforaminal Microdiscectomy  -  Lumbar two-three- right;  Surgeon: Tia Alert, MD;  Location: [facility];  Service: Neurosurgery;  Laterality: Right;  right     LUMBAR WOUND DEBRIDEMENT N/A 10/25/2016    Procedure: Lumbar wound revision;  Surgeon: Tia Alert, MD;  Location: [facility] OR;  Service: Neurosurgery;  Laterality: N/A;  Lumbar wound revision   Right shoulder surgery   2010    spur   RIGHT/LEFT HEART CATH AND CORONARY ANGIOGRAPHY N/A 10/05/2020    Procedure: RIGHT/LEFT HEART CATH AND CORONARY ANGIOGRAPHY;  Surgeon: [place]az[place], Peter M, MD;  Location: [facility] INVASIVE CV LAB;  Service: Cardiovascular;  Laterality: N/A;   SPINAL CORD STIMULATOR REMOVAL   02/19/2023    Procedure: LUMBAR SPINAL CORD STIMULATOR REMOVAL;  Surgeon: Tia Alert, MD;  Location: [facility] OR;  Service: Neurosurgery;;             Family History  Problem Relation Age of Onset   Lung cancer Father          smoked   Hypertension Father     Stroke Father     Heart failure Mother     Bone cancer Sister     Asthma Sister          Social History:  reports that she has never smoked. She has never used smokeless tobacco. She reports that she does not currently use alcohol. She reports that she does not use drugs. Allergies:  Allergies       Allergies  Allergen Reactions   Atorvastatin Nausea And Vomiting and Other (See Comments)      MYALGIAS   Talwin [Pentazocine] Other (See Comments)      headache   Cefepime Other (See Comments)      Presumed AMS/neurotoxicity in the setting of AKI   Gabapentin Swelling   Other Nausea Only      UNSPECIFIED Anesthesia            Medications Prior to Admission  Medication Sig Dispense Refill   carvedilol (COREG) 6.25 MG tablet Take 1 tablet (6.25 mg total) by mouth 2 (two) times daily. Take 6.25 mg by mouth 2 (two) times daily. / Needs appointment for further refills 60 tablet 0   ciprofloxacin (CIPRO) 500 MG tablet Take 1 tablet (500 mg total) by mouth 2 (two) times daily. 180 tablet 1   cyclobenzaprine  (FLEXERIL) 5 MG tablet Take 5 mg by mouth 3 (three)  times daily as needed for muscle spasms.       doxycycline (VIBRA-TABS) 100 MG tablet Take 1 tablet (100 mg total) by mouth 2 (two) times daily. Start taking 04/04/23 180 tablet 1   DULoxetine (CYMBALTA) 60 MG capsule Take 60 mg by mouth at bedtime.        loratadine (CLARITIN) 10 MG tablet Take 10 mg by mouth daily.       milk thistle 175 MG tablet Take 175 mg by mouth in the morning and at bedtime.       mirtazapine (REMERON) 30 MG tablet Take 30 mg by mouth at bedtime.       Multiple Vitamin (MULTIVITAMIN WITH MINERALS) TABS tablet Take 1 tablet by mouth daily.       Multiple Vitamins-Minerals (HAIR SKIN AND NAILS FORMULA) TABS Take 1 tablet by mouth daily.       nitroGLYCERIN (NITROSTAT) 0.4 MG SL tablet Place 0.4 mg under the tongue every 5 (five) minutes as needed for chest pain.       NOVOLOG RELION 100 UNIT/ML injection Inject 2-20 Units into the skin 3 (three) times daily with meals.       oxyCODONE (OXY IR/ROXICODONE) 5 MG immediate release tablet Take 0.5 tablets (2.5 mg total) by mouth every 6 (six) hours as needed for severe pain. 15 tablet 0   pantoprazole (PROTONIX) 40 MG tablet Take 1 tablet (40 mg total) by mouth daily. 90 tablet 2   pramipexole (MIRAPEX) 1 MG tablet Take 1 mg by mouth at bedtime.       pregabalin (LYRICA) 75 MG capsule Take 75 mg by mouth 2 (two) times daily.       tirzepatide [facility]) 5 MG/0.5ML Pen Inject 5 mg into the skin once a week.       torsemide (DEMADEX) 20 MG tablet Take 40 mg by mouth daily.       TRESIBA FLEXTOUCH 200 UNIT/ML FlexTouch Pen Inject 20 Units into the skin at bedtime. (Patient taking differently: Inject 60-120 Units into the skin at bedtime.) 3 mL 0   Continuous Glucose Sensor (FREESTYLE LIBRE 3 PLUS SENSOR) MISC Inject 1 Application into the skin.              Home: Home Living Family/patient expects to be discharged to:: Private residence Living Arrangements: Spouse/significant  other Available Help at Discharge: Family, Available 24 hours/day Type of Home: House Home Access: Ramped entrance Home Layout: One level Bathroom Shower/Tub: Health visitor: Handicapped height Bathroom Accessibility: Yes Home Equipment: Agricultural consultant (2 wheels), Rollator (4 wheels), Wheelchair - manual, Shower seat, Grab bars - tub/shower, BSC/3in1, Hand held shower head, Adaptive equipment, Other (comment) Adaptive Equipment: Reacher, Sock aid, Long-handled shoe horn, Long-handled sponge  Functional History: Prior Function Prior Level of Function : Needs assist Mobility Comments: using wc more recently, husband assist with transfers ADLs Comments: Husband assist with transfers & LB ADLs Functional Status:  Mobility: Bed Mobility Overal bed mobility: Needs Assistance Bed Mobility: Supine to Sit, Sit to Supine Supine to sit: Max assist, HOB elevated, Used rails Sit to supine: Max assist, HOB elevated, Used rails General bed mobility comments: Assist for Bil LEs and trunk Transfers Overall transfer level: Needs assistance General transfer comment: Not safe to complete   ADL: ADL Overall ADL's : Needs assistance/impaired Eating/Feeding: Set up, Bed level Grooming: Set up, Bed level Upper Body Bathing: Set up, Bed level Lower Body Bathing: Total assistance, Bed level Upper Body Dressing : Set up, Bed  level Lower Body Dressing: Total assistance, Sitting/lateral leans Lower Body Dressing Details (indicate cue type and reason): Per pt assist at baseline   Cognition: Cognition Orientation Level: Oriented X4 Cognition Arousal: Alert Behavior During Therapy: WFL for tasks assessed/performed   Blood pressure (!) 146/67, pulse 91, temperature 98.5 F (36.9 C), temperature source Oral, resp. rate 18, height 5\' 1"  (1.549 m), weight 90.7 kg, SpO2 98%. Physical Exam Constitutional: No apparent distress. Appropriate appearance for age. +Obese HENT: No JVD. Neck  Supple. Trachea midline. + swelling R lip Eyes: PERRLA. EOMI. Visual fields grossly intact.  Cardiovascular: RRR, no murmurs/rub/gallops. 2+ bilateral peripheral Edema. Peripheral pulses 2+  Respiratory: CTAB. No rales, rhonchi, or wheezing. On RA.  Abdomen: + bowel sounds, normoactive. No distention or tenderness.  GU: Not examined. +Foley, draining clear urine.  Skin: Peripheral IVs intact. Wound vac well adhered to back incision, draining minimal gross blood.    MSK:      No apparent deformity. Full PROM BL UE       Neurologic exam:  Cognition: AAO to person, place, time and event.  Language: Fluent, No substitutions or neoglisms. No dysarthria. Names 3/3 objects correctly.  Memory: Recalls 3/3 objects at 5 minutes. No apparent deficits  Insight: Good insight into current condition.  Mood: Pleasant affect, appropriate mood. Slightly tearful.  Sensation: To light touch reduced in distal fingertips and toes bilaterally; reduced on bilateral medial knees and R lateral knee.    Reflexes: 2+ in BL UE; hyporeflexic BL LE. No babinski. CN: 2-12 grossly intact.  Coordination: No apparent tremors. No ataxia on FTN bilaterally.  Spasticity: MAS 0 in all extremities.       Strength:                RUE: 5/5 SA, 5/5 EF, 5/5 EE, 5/5 WE, 5/5 FF, 5/5 FA                LUE:  5/5 SA, 5/5 EF, 5/5 EE, 5/5 WE, 5/5 FF, 5/5 FA                RLE: 2/5 HF, 3/5 KE, 4-/5  DF, 4-/5  EHL, 4-/5  PF                 LLE:  3+/5 HF, 4-/5 KE, 5-/5  DF, 5-/5  EHL, 5-/5  PF      Lab Results Last 24 Hours        Results for orders placed or performed during the hospital encounter of 01/07/24 (from the past 24 hours)  Glucose, capillary     Status: Abnormal    Collection Time: 01/09/24  2:13 PM  Result Value Ref Range    Glucose-Capillary 232 (H) 70 - 99 mg/dL    Comment 1 Notify RN      Comment 2 Document in Chart    Prepare RBC (crossmatch)     Status: None    Collection Time: 01/09/24  4:34 PM  Result Value  Ref Range    Order Confirmation          ORDER PROCESSED BY BLOOD BANK Performed at [facility], [address] [place], [place] [address]    Aerobic/Anaerobic Culture w Gram Stain (surgical/deep wound)     Status: None (Preliminary result)    Collection Time: 01/09/24  4:34 PM    Specimen: Intervertebral Disc; Tissue  Result Value Ref Range    Specimen Description WOUND      Special  Requests INTERVERTERAL DISC 9,10 PT ON ANCEF      Gram Stain NO WBC SEEN NO ORGANISMS SEEN        Culture          NO GROWTH < 12 HOURS Performed at [facility], [address] [place], [place] [address]      Report Status PENDING    Aerobic/Anaerobic Culture w Gram Stain (surgical/deep wound)     Status: None (Preliminary result)    Collection Time: 01/09/24  4:44 PM    Specimen: Intervertebral Disc; Tissue  Result Value Ref Range    Specimen Description WOUND      Special Requests INTERVERTEBRAL DISC 9,10 SWAB PT ON ANCEF      Gram Stain NO WBC SEEN NO ORGANISMS SEEN        Culture          NO GROWTH < 12 HOURS Performed at [facility], [address], [place], [place] [address]      Report Status PENDING    I-STAT 7, (LYTES, BLD GAS, ICA, H+H)     Status: Abnormal    Collection Time: 01/09/24  4:56 PM  Result Value Ref Range    pH, Arterial 7.452 (H) 7.35 - 7.45    pCO2 arterial 37.4 32 - 48 mmHg    pO2, Arterial 266 (H) 83 - 108 mmHg    Bicarbonate 26.3 20.0 - 28.0 mmol/L    TCO2 28 22 - 32 mmol/L    O2 Saturation 100 %    Acid-Base Excess 2.0 0.0 - 2.0 mmol/L    Sodium 136 135 - 145 mmol/L    Potassium 4.4 3.5 - 5.1 mmol/L    Calcium, Ion 1.18 1.15 - 1.40 mmol/L    HCT 26.0 (L) 36.0 - 46.0 %    Hemoglobin 8.8 (L) 12.0 - 15.0 g/dL    Patient temperature 36.2 C      Sample type ARTERIAL    Glucose, capillary     Status: Abnormal    Collection Time: 01/09/24  6:16 PM  Result Value Ref Range    Glucose-Capillary 234 (H) 70 - 99 mg/dL    Comment 1 Notify RN     Glucose, capillary     Status: Abnormal    Collection Time: 01/09/24  8:16 PM  Result Value Ref Range    Glucose-Capillary 248 (H) 70 - 99 mg/dL    Comment 1 Notify RN    Creatinine, serum     Status: Abnormal    Collection Time: 01/10/24  6:08 AM  Result Value Ref Range    Creatinine, Ser 1.33 (H) 0.44 - 1.00 mg/dL    GFR, Estimated 44 (L) >60 mL/min  Glucose, capillary     Status: Abnormal    Collection Time: 01/10/24  6:36 AM  Result Value Ref Range    Glucose-Capillary 318 (H) 70 - 99 mg/dL  Glucose, capillary     Status: Abnormal    Collection Time: 01/10/24 11:47 AM  Result Value Ref Range    Glucose-Capillary 368 (H) 70 - 99 mg/dL       Imaging Results (Last 48 hours)  DG Thoracic Spine 2 View Result Date: 01/09/2024 CLINICAL DATA:  Elective surgery. EXAM: THORACIC SPINE 2 VIEWS COMPARISON:  Preoperative imaging FINDINGS: Five fluoroscopic spot views from the operating room submitted. Portions of thoracolumbar fusion hardware visualized, now extending cranially into the T7 level. Fluoroscopy time 74.5 seconds. Dose 26.05 mGy. Portions of a spinal  stimulator are seen. IMPRESSION: Intraoperative fluoroscopy during thoracic spine surgery. Electronically Signed   By: Narda Rutherford M.D.   On: 01/09/2024 18:20    DG C-Arm 1-60 Min-No Report Result Date: 01/09/2024 Fluoroscopy was utilized by the requesting physician.  No radiographic interpretation.    DG C-Arm 1-60 Min-No Report Result Date: 01/09/2024 Fluoroscopy was utilized by the requesting physician.  No radiographic interpretation.    DG C-Arm 1-60 Min-No Report Result Date: 01/09/2024 Fluoroscopy was utilized by the requesting physician.  No radiographic interpretation.    CT THORACIC SPINE W CONTRAST Result Date: 01/08/2024 CLINICAL DATA:  Mid back pain.  Myelogram.  Osteomyelitis. EXAM: CT THORACIC SPINE WITH CONTRAST TECHNIQUE: Multidetector CT images of thoracic was performed according to the standard protocol  following intrathecal contrast administration. RADIATION DOSE REDUCTION: This exam was performed according to the departmental dose-optimization program which includes automated exposure control, adjustment of the mA and/or kV according to patient size and/or use of iterative reconstruction technique. CONTRAST:  10mL OMNIPAQUE IOHEXOL 300 MG/ML  SOLN COMPARISON:  Thoracic spine CT 01/08/2024, 01/07/2024 FINDINGS: Alignment: Unchanged grade 1 retrolisthesis at T11-12. Vertebrae: Erosive endplate changes at T9-10 and T11-12, unchanged. Lucency about the T10 transpedicular screws, greater on the left. Paraspinal and other soft tissues: Edema within the paraspinal soft tissues at T9-10. Disc levels: The thecal sac is severely attenuated at the T9-10 level by soft tissue density material, likely epidural phlegmon/abscess. Thecal sac caliber is normal below the T9-10 level. There is right asymmetric clumping of the nerve roots within the upper lumbar spinal canal. IMPRESSION: 1. Severe thecal sac attenuation at the T9-10 level by soft tissue density material, likely epidural phlegmon/abscess. 2. 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 3. Unchanged endplate erosion at T9-10 and T11-12 Electronically Signed   By: Deatra Robinson M.D.   On: 01/08/2024 20:43    DG FL GUIDED LP INJECT MYELOGRAM THORACIC Result Date: 01/08/2024 CLINICAL DATA:  Thoracolumbar posterior fusion, thoracic spondylosis with myelopathy FLUOROSCOPY: 1 minute 36 seconds, 38.1 mg PROCEDURE: LUMBAR PUNCTURE FOR THORACIC MYELOGRAM After thorough discussion of risks and benefits of the procedure including bleeding, infection, injury to nerves, blood vessels, adjacent structures as well as headache and CSF leak, written and oral informed consent was obtained. Consent was obtained by Dr. Judie Petit. Shick. Patient was positioned prone on the fluoroscopy table. Local anesthesia was provided with 1%  lidocaine without epinephrine after prepped and draped in the usual sterile fashion. Puncture was performed at L1-2 using a 3 1/2 inch 20 gauge spinal needle via midline approach. Using a single pass through the dura, the needle was placed within the thecal sac, with return of clear CSF. 10 mL of omni 300 was injected into the thecal sac, with normal opacification of the nerve roots and cauda equina consistent with free flow within the subarachnoid space. The patient was then moved to the trendelenburg position and contrast flowed into the Thoracic spine region. I personally performed the lumbar puncture and administered the intrathecal contrast. I also personally supervised acquisition of the myelogram images. TECHNIQUE: Spot fluoroscopic imaging performed for documentation. FINDINGS: THORACIC MYELOGRAM FINDINGS: Imaging confirms needle placement at the L1-2 level. Clear CSF obtained. Successful thecal injection of the 10 cc Omnipaque 300 contrast. Good dispersal initially in the lumbar region. Trendelenburg position allowed good dispersal throughout the thoracic region. Next, thoracic CT imaging will be performed. IMPRESSION: Successful lumbar puncture for thoracic myelogram. Electronically Signed   By:  M.  Shick M.D.   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 Yes Co-Morbidities requiring supervision/potential complications: Poor pain control, post-op wound monitoring, hypertension, liver cirrhosis, anemia, CKD stage III, CHF, urinary retention, and constipation Due to bladder management, bowel management, safety, skin/wound care, disease management, medication administration, pain management, and patient education, does the  patient require 24 hr/day rehab nursing? Yes Does the patient require coordinated care of a physician, rehab nurse, therapy disciplines of PT, OT to address physical and functional deficits in the context of the above medical diagnosis(es)? Yes Addressing deficits in the following areas: balance, endurance, locomotion, strength, transferring, bowel/bladder control, bathing, dressing, feeding, grooming, and toileting Can the patient actively participate in an intensive therapy program of at least 3 hrs of therapy per day at least 5 days per week? Yes The potential for patient to make measurable gains while on inpatient rehab is good Anticipated functional outcomes upon discharge from inpatient rehab are min assist  with PT, supervision with OT. Estimated rehab length of stay to reach the above functional goals is: 14-16 days Anticipated discharge destination: Home Overall Rehab/Functional Prognosis: good   POST ACUTE RECOMMENDATIONS: This patient's condition is appropriate for continued rehabilitative care in the following setting: CIR Patient has agreed to participate in recommended program. Yes Note that insurance prior authorization may be required for reimbursement for recommended care.   Comment: Katelyn [name] is a 68 year old prior Mod I ambulation and independent for ADLs (except donning compression socks) who presents with T10 SCI with myelopathy, now s/p T7-L2 fusion and replacement of T10, L1, and T12 screws. She is regaining sensation and mobility in her legs but could benefit from inpatient rehab stay for continued strengthening and conditioning. She has a supportive husband who can provide 24/7 assist but due to her size will be limited in capabilities to Min A level. In addition, she meets medical necessity due to recent surgical wound with a vac, poor pain control, urinary retention/neurogenic bladder with a foley, poorly controlled diabetes with hyperglycemia, hypertension, and acute blood  loss anemia.      MEDICAL RECOMMENDATIONS: Apply ACE wraps to assist in edema management as patient cannot currently don her home TED hose Agree with increasing diabetes regimen; consider addition of standing premeal insulin for PO intakes >50% Increase bowel regimen with daily miralax DC foley trial when medically feasible     I have personally performed a face to face diagnostic evaluation of this patient. Additionally, I have examined the patient's medical record including any pertinent labs and radiographic images. If the physician assistant has documented in this note, I have reviewed and edited or otherwise concur with the physician assistant's documentation.   Thanks,   [name], DO 01/10/2024          Routing History

## 2024-01-11 NOTE — H&P (Signed)
 Expand All Collapse All      Physical Medicine and Rehabilitation Admission H&P        Chief Complaint  Patient presents with   Functional deficits      HPI:  Katelyn [name] is a 68 year old female with history of CAD, chronic diastolic CHF, HTN, GERD, CKD, anxiety d/o, esophageal stricture, multiple back surgeries with post op serratia infection in 2017, osteo/diskitis  w/spinal cord stimulator in place 11/2022 treated with antibiotics but follow up imaging showing progressive discitis/oste s/p  medial facetectomy T11-T12 and posterior fixation T12-L2 and removal of Leona stimulator 02/2023 followed by course of antibiotics with transition to po doxy/Cipro. She was admitted on 01/07/24 with progressive LE weakness with inability to walk as well as burning pain in back. She was found to have urinary retention and foley placed.  Myelogram w/CT lumbar spine 01/07/24 showing no change in T9-T10 discitis/osteo, no focal fluid collection but mass effect on thecal sac. She was taken to OR for decompressive thoracic lam with foraminotomies T9-T10, exploration of T10-L2 fusion with removal of loosened T10 pedicle screw and hardware with posterior fixation T7-L1 by Dr. Yetta Barre on 01/09/24.    Dr. Elinor Parkinson consulted for input and recommended d/c of cipro/doxy and daptomycin and ertapenum pending wound cultures. PICC to be placed today for prolonged antibiotics. Therapy has been working with patient who requires max to total assist with ADLs and mobiltiy. She was independent with use of stand up walker prior to a week ago. CIR Recommended due to functional decline.      Review of Systems  HENT:  Negative for hearing loss and tinnitus.        Severe oral/lip pain.   Eyes:  Negative for blurred vision and double vision.  Respiratory:  Negative for cough and shortness of breath.   Cardiovascular:  Negative for chest pain and palpitations.  Gastrointestinal:  Positive for constipation. Negative for abdominal pain  and heartburn.  Genitourinary:  Negative for dysuria and urgency.  Musculoskeletal:  Positive for back pain, joint pain (right shoulder--heard a pop this am) and myalgias.  Neurological:  Positive for sensory change and weakness.  Psychiatric/Behavioral:  Positive for depression. The patient is nervous/anxious.             Past Medical History:  Diagnosis Date   Anxiety     Arthritis     Bursitis of right hip     CAD (coronary artery disease)      Cardiac catheterization June 2014 in [place] - 50% circumflex stenosis   Chest pain, neg MI, stable CAD non obstructive on cath 10/05/20 10/04/2020   Chronic diastolic heart failure (HCC) 08/20/2017   Chronic kidney disease, stage 3 (HCC)      does not see nephrologist   Chronic low back pain without sciatica 03/14/2016   Cirrhosis of liver (HCC)     CKD (chronic kidney disease), stage III (HCC) 10/07/2020   Complication of anesthesia     Cough 04/19/2017    Overview:  Last Assessment & Plan:  Formatting of this note may be different from the original. Cough - ? ACE related with AR triggers   Plan  Patient Instructions  Discuss with your primary doctor that lisinopril pain, need making your cough worse. May use Mucinex DM twice daily as needed for cough and congestion Zyrtec 10 mg at bedtime as needed for drainage Saline nasal spray as needed. Lab tests today Activity as tolerated. Follow with Dr. Craige Cotta in  3-4 months and As needed   Please contact office for sooner follow up if symptoms do not improve or worsen or seek emergency care    Depression     Dyspnea      with exertion   " lazy lung" - per  Dr Craige Cotta from back issues- 06/2016   Elevated liver enzymes 12/05/2016   Essential hypertension     GERD (gastroesophageal reflux disease)     Gout 03/14/2016   Greater trochanteric bursitis of right hip 02/02/2012   H/O hiatal hernia     Heart murmur     History of blood transfusion 2016   History of esophageal stricture 10/07/2020    History of kidney stones     Hypercholesterolemia     Hypertensive heart disease with heart failure (HCC) 01/01/2017   Hypoxia 10/07/2020   Iliotibial band syndrome of right side 02/02/2012   Iron deficiency anemia due to chronic blood loss 03/14/2016   LBBB (left bundle branch block) 01/01/2017   Left bundle branch block     Leg weakness 10/07/2020   Lumbar stenosis     Meralgia paraesthetica 12/05/2016   Mild CAD 11/24/2015   Morbid obesity (HCC) 10/07/2020   Neuropathy     OSA (obstructive sleep apnea) 05/24/2016    Overview:  Managed PULM- no CPAP   PONV (postoperative nausea and vomiting)      "no N/V with patch"   Restless leg syndrome     S/P lumbar laminectomy 11/26/2015   S/P lumbar spinal fusion 08/29/2016   Tinnitus 12/05/2016   Type 2 diabetes mellitus (HCC)     UTI (urinary tract infection) 10/07/2020               Past Surgical History:  Procedure Laterality Date   ABDOMINAL HYSTERECTOMY   1983   APPENDECTOMY   Age 89   BACK SURGERY        FIRST LUMBAR FUSION/ SURGERY APRIL 2012 AND FUSION WITH INSTRUMENTATION SEPT 2012   CARDIAC CATHETERIZATION        x 2   CARPAL TUNNEL RELEASE        bil   CHOLECYSTECTOMY   1990's   COLONOSCOPY   12/12/2017    Colonic polyp status post polypectomy. Mild sigmoid diverticulosis. Otherwise normal colonoscopy to terminal ileum.   CYSTO EXTRACTION KIDNEY STONES       ESOPHAGOGASTRODUODENOSCOPY   12/12/2017    Small hiatal hernia. Mild gastritis. Status post esophageal dilatation.   EXCISION/RELEASE BURSA HIP   02/02/2012    Procedure: EXCISION/RELEASE BURSA HIP;  Surgeon: Jacki Cones, MD;  Location: [facility];  Service: Orthopedics;  Laterality: Right;  Right Hip Bursectomy   EYE SURGERY Bilateral      cataracts   IR THORACIC DISC ASPIRATION W/IMG GUIDE   02/14/2023   KIDNEY STONE SURGERY   2008   LAMINECTOMY WITH POSTERIOR LATERAL ARTHRODESIS LEVEL 3 N/A 01/09/2024    Procedure: Posterior lateral fusion - Thoracic  Seven-Thoracic Eight - Thoracic Eight-Thoracic Nine - Thoracic Nine-Thoracic Ten, Thoracic Ten-Eleven, Thoracic Eleven-Twelve, Thoracic Twelve -Lumbar One, extension of fusion and removal of Thoracic Ten screws;  Surgeon: Arman Bogus, MD;  Location[facility];  Service: Neurosurgery;  Laterality: N/A;  Posterior lateral fusion - Thoracic Seven-Thora   LAMINECTOMY WITH POSTERIOR LATERAL ARTHRODESIS LEVEL 4 N/A 02/19/2023    Procedure: THORACIC TEN-LUMBAR TWO INSTRUMENTED FUSION;  Surgeon: Tia Alert, MD;  Location: [facility] OR;  Service: Neurosurgery;  Laterality: N/A;   Left knee  surgery x 2   1996    reconstruction   LUMBAR DISC SURGERY   08/2016   LUMBAR LAMINECTOMY/DECOMPRESSION MICRODISCECTOMY Right 11/26/2015    Procedure: Extraforaminal Microdiscectomy  - Lumbar two-three- right;  Surgeon: Tia Alert, MD;  Location: [facility];  Service: Neurosurgery;  Laterality: Right;  right     LUMBAR WOUND DEBRIDEMENT N/A 10/25/2016    Procedure: Lumbar wound revision;  Surgeon: Tia Alert, MD;  Location: [facility] OR;  Service: Neurosurgery;  Laterality: N/A;  Lumbar wound revision   Right shoulder surgery   2010    spur   RIGHT/LEFT HEART CATH AND CORONARY ANGIOGRAPHY N/A 10/05/2020    Procedure: RIGHT/LEFT HEART CATH AND CORONARY ANGIOGRAPHY;  Surgeon: Swaziland, Peter M, MD;  Location: [facility] INVASIVE CV LAB;  Service: Cardiovascular;  Laterality: N/A;   SPINAL CORD STIMULATOR REMOVAL   02/19/2023    Procedure: LUMBAR SPINAL CORD STIMULATOR REMOVAL;  Surgeon: Tia Alert, MD;  Location: [facility] OR;  Service: Neurosurgery;;               Family History  Problem Relation Age of Onset   Lung cancer Father          smoked   Hypertension Father     Stroke Father     Heart failure Mother     Bone cancer Sister     Asthma Sister            Social History: Married. Retired (used to [employer]). She  reports that she has never smoked. She has never used smokeless tobacco. She reports  that she does not currently use alcohol. She reports that she does not use drugs.     Allergies       Allergies  Allergen Reactions   Atorvastatin Nausea And Vomiting and Other (See Comments)      MYALGIAS   Talwin [Pentazocine] Other (See Comments)      headache   Cefepime Other (See Comments)      Presumed AMS/neurotoxicity in the setting of AKI   Gabapentin Swelling   Other Nausea Only      UNSPECIFIED Anesthesia              Medications Prior to Admission  Medication Sig Dispense Refill   carvedilol (COREG) 6.25 MG tablet Take 1 tablet (6.25 mg total) by mouth 2 (two) times daily. Take 6.25 mg by mouth 2 (two) times daily. / Needs appointment for further refills 60 tablet 0   ciprofloxacin (CIPRO) 500 MG tablet Take 1 tablet (500 mg total) by mouth 2 (two) times daily. 180 tablet 1   cyclobenzaprine (FLEXERIL) 5 MG tablet Take 5 mg by mouth 3 (three) times daily as needed for muscle spasms.       doxycycline (VIBRA-TABS) 100 MG tablet Take 1 tablet (100 mg total) by mouth 2 (two) times daily. Start taking 04/04/23 180 tablet 1   DULoxetine (CYMBALTA) 60 MG capsule Take 60 mg by mouth at bedtime.        loratadine (CLARITIN) 10 MG tablet Take 10 mg by mouth daily.       milk thistle 175 MG tablet Take 175 mg by mouth in the morning and at bedtime.       mirtazapine (REMERON) 30 MG tablet Take 30 mg by mouth at bedtime.       Multiple Vitamin (MULTIVITAMIN WITH MINERALS) TABS tablet Take 1 tablet by mouth daily.       Multiple Vitamins-Minerals (HAIR  SKIN AND NAILS FORMULA) TABS Take 1 tablet by mouth daily.       nitroGLYCERIN (NITROSTAT) 0.4 MG SL tablet Place 0.4 mg under the tongue every 5 (five) minutes as needed for chest pain.       NOVOLOG RELION 100 UNIT/ML injection Inject 2-20 Units into the skin 3 (three) times daily with meals.       oxyCODONE (OXY IR/ROXICODONE) 5 MG immediate release tablet Take 0.5 tablets (2.5 mg total) by mouth every 6 (six) hours as needed for  severe pain. 15 tablet 0   pantoprazole (PROTONIX) 40 MG tablet Take 1 tablet (40 mg total) by mouth daily. 90 tablet 2   pramipexole (MIRAPEX) 1 MG tablet Take 1 mg by mouth at bedtime.       pregabalin (LYRICA) 75 MG capsule Take 75 mg by mouth 2 (two) times daily.       tirzepatide [facility]) 5 MG/0.5ML Pen Inject 5 mg into the skin once a week.       torsemide (DEMADEX) 20 MG tablet Take 40 mg by mouth daily.       TRESIBA FLEXTOUCH 200 UNIT/ML FlexTouch Pen Inject 20 Units into the skin at bedtime. (Patient taking differently: Inject 60-120 Units into the skin at bedtime.) 3 mL 0   Continuous Glucose Sensor (FREESTYLE LIBRE 3 PLUS SENSOR) MISC Inject 1 Application into the skin.                  Home: Home Living Family/patient expects to be discharged to:: Private residence Living Arrangements: Spouse/significant other Available Help at Discharge: Family, Available 24 hours/day Type of Home: House Home Access: Ramped entrance Home Layout: One level Bathroom Shower/Tub: Health visitor: Handicapped height Bathroom Accessibility: Yes Home Equipment: Agricultural consultant (2 wheels), Rollator (4 wheels), Wheelchair - manual, Shower seat, Grab bars - tub/shower, BSC/3in1, Hand held shower head, Adaptive equipment, Other (comment) Adaptive Equipment: Reacher, Sock aid, Long-handled shoe horn, Long-handled sponge   Functional History: Prior Function Prior Level of Function : Needs assist Mobility Comments: using a w/c since last Friday 2/2 increasing LE weakness ADLs Comments: Husband assist with transfers & LB ADLs   Functional Status:  Mobility: Bed Mobility Overal bed mobility: Needs Assistance Bed Mobility: Rolling, Sidelying to Sit, Sit to Sidelying Rolling: Mod assist Sidelying to sit: Mod assist, +2 for physical assistance Supine to sit: Max assist, HOB elevated, Used rails Sit to supine: Max assist, HOB elevated, Used rails Sit to sidelying: Mod assist, +2  for physical assistance General bed mobility comments: assist for trunk and LE management, use of log roll technique to perform Transfers Overall transfer level: Needs assistance Equipment used: Rolling walker (2 wheels) Transfers: Sit to/from Stand Sit to Stand: Min assist, +2 physical assistance, From elevated surface General transfer comment: assist for completion of rise with facilitation through sacrum, pt with good initiation of power up. Locks RLE out into extension to prevent buckling   ADL: ADL Overall ADL's : Needs assistance/impaired Eating/Feeding: Set up, Bed level Grooming: Set up, Bed level Upper Body Bathing: Set up, Bed level Lower Body Bathing: Total assistance, Bed level Upper Body Dressing : Set up, Bed level Lower Body Dressing: Total assistance, Sitting/lateral leans Lower Body Dressing Details (indicate cue type and reason): Per pt assist at baseline   Cognition: Cognition Orientation Level: Oriented X4 Cognition Arousal: Alert Behavior During Therapy: WFL for tasks assessed/performed     Blood pressure (!) 166/61, pulse 73, temperature 98.1 F (36.7 C), temperature  source Oral, resp. rate 16, height 5\' 1"  (1.549 m), weight 90.7 kg, SpO2 97%. Physical Exam  Constitutional: No apparent distress. Appropriate appearance for age. +Obese HENT: No JVD. Neck Supple. Trachea midline.   Eyes: PERRLA. EOMI. Visual fields grossly intact.  Cardiovascular: RRR, no murmurs/rub/gallops. 2+ bilateral peripheral Edema. Peripheral pulses 2+  Respiratory: CTAB. No rales, rhonchi, or wheezing. On RA.  Abdomen: + bowel sounds, normoactive. No distention or tenderness.  GU:   +Foley  Skin: Peripheral IVs intact. Wound vac well adhered to back incision, draining minimal gross blood.  Right facial and lip edema   herpetic blisters upper lip vermilion border. Reports stable since surgery.     MSK:      No apparent deformity. + RUE shoulder abduction/flexion limited to <50  degrees       Neurologic exam:  Cognition: AAO to person, place, time and event.  Language: Fluent, No substitutions or neoglisms. No dysarthria. Names 3/3 objects correctly.  Memory: Recalls 3/3 objects at 5 minutes. No apparent deficits  Insight: Good insight into current condition.  Mood: Pleasant affect, appropriate mood. Slightly tearful.  Sensation: To light touch reduced in distal fingertips and toes bilaterally; reduced on bilateral medial knees and R lateral knee.    Reflexes: 2+ in BL UE; hyporeflexic BL LE. No babinski. CN: 2-12 grossly intact.  Coordination: No apparent tremors. No ataxia on FTN bilaterally.  Spasticity: MAS 0 in all extremities.       Strength:                RUE: 4-/5 SA, 5/5 EF, 5/5 EE, 5/5 WE, 5/5 FF, 5/5 FA                LUE:  5/5 SA, 5/5 EF, 5/5 EE, 5/5 WE, 5/5 FF, 5/5 FA                RLE: 2+/5 HF, 4/5 KE, 0/5  DF, 1/5  EHL, 3/5  PF                 LLE:  4-/5 HF, 4-/5 KE, 5-/5  DF, 5-/5  EHL, 5-/5  PF          Lab Results Last 48 Hours        Results for orders placed or performed during the hospital encounter of 01/07/24 (from the past 48 hours)  Prepare RBC (crossmatch)     Status: None    Collection Time: 01/09/24  4:34 PM  Result Value Ref Range    Order Confirmation          ORDER PROCESSED BY BLOOD BANK Performed at [facility], [address], [place], [place] [address]    Aerobic/Anaerobic Culture w Gram Stain (surgical/deep wound)     Status: None (Preliminary result)    Collection Time: 01/09/24  4:34 PM    Specimen: Intervertebral Disc; Tissue  Result Value Ref Range    Specimen Description WOUND      Special Requests INTERVERTERAL DISC 9,10 PT ON ANCEF      Gram Stain NO WBC SEEN NO ORGANISMS SEEN        Culture          NO GROWTH 2 DAYS NO ANAEROBES ISOLATED; CULTURE IN PROGRESS FOR 5 DAYS Performed at [facility], [address] [place], [place] [address]      Report Status PENDING    Aerobic/Anaerobic  Culture w Gram Stain (surgical/deep wound)  Status: None (Preliminary result)    Collection Time: 01/09/24  4:44 PM    Specimen: Intervertebral Disc; Tissue  Result Value Ref Range    Specimen Description WOUND      Special Requests INTERVERTEBRAL DISC 9,10 SWAB PT ON ANCEF      Gram Stain NO WBC SEEN NO ORGANISMS SEEN        Culture          NO GROWTH 2 DAYS NO ANAEROBES ISOLATED; CULTURE IN PROGRESS FOR 5 DAYS Performed at [facility], [address] [place], [place] [address]      Report Status PENDING    I-STAT 7, (LYTES, BLD GAS, ICA, H+H)     Status: Abnormal    Collection Time: 01/09/24  4:56 PM  Result Value Ref Range    pH, Arterial 7.452 (H) 7.35 - 7.45    pCO2 arterial 37.4 32 - 48 mmHg    pO2, Arterial 266 (H) 83 - 108 mmHg    Bicarbonate 26.3 20.0 - 28.0 mmol/L    TCO2 28 22 - 32 mmol/L    O2 Saturation 100 %    Acid-Base Excess 2.0 0.0 - 2.0 mmol/L    Sodium 136 135 - 145 mmol/L    Potassium 4.4 3.5 - 5.1 mmol/L    Calcium, Ion 1.18 1.15 - 1.40 mmol/L    HCT 26.0 (L) 36.0 - 46.0 %    Hemoglobin 8.8 (L) 12.0 - 15.0 g/dL    Patient temperature 36.2 C      Sample type ARTERIAL    Glucose, capillary     Status: Abnormal    Collection Time: 01/09/24  6:16 PM  Result Value Ref Range    Glucose-Capillary 234 (H) 70 - 99 mg/dL      Comment: Glucose reference range applies only to samples taken after fasting for at least 8 hours.    Comment 1 Notify RN    Glucose, capillary     Status: Abnormal    Collection Time: 01/09/24  8:16 PM  Result Value Ref Range    Glucose-Capillary 248 (H) 70 - 99 mg/dL      Comment: Glucose reference range applies only to samples taken after fasting for at least 8 hours.    Comment 1 Notify RN    Creatinine, serum     Status: Abnormal    Collection Time: 01/10/24  6:08 AM  Result Value Ref Range    Creatinine, Ser 1.33 (H) 0.44 - 1.00 mg/dL    GFR, Estimated 44 (L) >60 mL/min      Comment: (NOTE) Calculated using the  CKD-EPI Creatinine Equation (2021) Performed at [facility], [address], [place], [place] [address]    Glucose, capillary     Status: Abnormal    Collection Time: 01/10/24  6:36 AM  Result Value Ref Range    Glucose-Capillary 318 (H) 70 - 99 mg/dL      Comment: Glucose reference range applies only to samples taken after fasting for at least 8 hours.  Glucose, capillary     Status: Abnormal    Collection Time: 01/10/24 11:47 AM  Result Value Ref Range    Glucose-Capillary 368 (H) 70 - 99 mg/dL      Comment: Glucose reference range applies only to samples taken after fasting for at least 8 hours.  Glucose, capillary     Status: Abnormal    Collection Time: 01/10/24  4:11 PM  Result Value Ref Range  Glucose-Capillary 319 (H) 70 - 99 mg/dL      Comment: Glucose reference range applies only to samples taken after fasting for at least 8 hours.  Glucose, capillary     Status: Abnormal    Collection Time: 01/10/24  9:47 PM  Result Value Ref Range    Glucose-Capillary 342 (H) 70 - 99 mg/dL      Comment: Glucose reference range applies only to samples taken after fasting for at least 8 hours.    Comment 1 Notify RN      Comment 2 Document in Chart    Sedimentation rate     Status: None    Collection Time: 01/11/24  5:24 AM  Result Value Ref Range    Sed Rate 13 0 - 22 mm/hr      Comment: Performed at [facility], [address], [place], [place] [address]  C-reactive protein     Status: Abnormal    Collection Time: 01/11/24  5:24 AM  Result Value Ref Range    CRP 2.6 (H) <1.0 mg/dL      Comment: Performed at [facility], [address] [place], [place] [address]  CK     Status: None    Collection Time: 01/11/24  5:24 AM  Result Value Ref Range    Total CK 55 38 - 234 U/L      Comment: Performed at [facility], [address] [place], [place] [address]  Glucose, capillary     Status: Abnormal    Collection Time: 01/11/24  6:13 AM  Result Value Ref  Range    Glucose-Capillary 204 (H) 70 - 99 mg/dL      Comment: Glucose reference range applies only to samples taken after fasting for at least 8 hours.    Comment 1 Notify RN      Comment 2 Document in Chart    Glucose, capillary     Status: Abnormal    Collection Time: 01/11/24 11:26 AM  Result Value Ref Range    Glucose-Capillary 230 (H) 70 - 99 mg/dL      Comment: Glucose reference range applies only to samples taken after fasting for at least 8 hours.       Imaging Results (Last 48 hours)  DG Thoracic Spine 2 View Result Date: 01/09/2024 CLINICAL DATA:  Elective surgery. EXAM: THORACIC SPINE 2 VIEWS COMPARISON:  Preoperative imaging FINDINGS: Five fluoroscopic spot views from the operating room submitted. Portions of thoracolumbar fusion hardware visualized, now extending cranially into the T7 level. Fluoroscopy time 74.5 seconds. Dose 26.05 mGy. Portions of a spinal stimulator are seen. IMPRESSION: Intraoperative fluoroscopy during thoracic spine surgery. Electronically Signed   By: Narda Rutherford M.D.   On: 01/09/2024 18:20    DG C-Arm 1-60 Min-No Report Result Date: 01/09/2024 Fluoroscopy was utilized by the requesting physician.  No radiographic interpretation.    DG C-Arm 1-60 Min-No Report Result Date: 01/09/2024 Fluoroscopy was utilized by the requesting physician.  No radiographic interpretation.    DG C-Arm 1-60 Min-No Report Result Date: 01/09/2024 Fluoroscopy was utilized by the requesting physician.  No radiographic interpretation.           Blood pressure (!) 166/61, pulse 73, temperature 98.1 F (36.7 C), temperature source Oral, resp. rate 16, height 5\' 1"  (1.549 m), weight 90.7 kg, SpO2 97%.   Medical Problem List and Plan: 1. Functional deficits secondary to  T10 spinal stenosis with myelopathy s/p T7-L2 fusion and replacement of  T10, L1, and T12 screws              -patient may not shower until wound vac is removed              -ELOS/Goals: 9-12 days, SPV  PT/OT             - Stable for IRF admission  2.  Antithrombotics: -DVT/anticoagulation:  Mechanical: Sequential compression devices, below knee Bilateral lower extremities             -antiplatelet therapy: N/A 3. Acute on chronic pain/Pain Management:  Was on Oxycodone 5 mg and Lyrica 75 mg daily PTA.  --Having surgical pain--continue Oxycodone prn. Pain worse at nights-->will schedule oxycodone at bedtime.  4. Mood/Behavior/Sleep: LCSW to folow for evaluation and support.              -antipsychotic agents: N/A 5. Neuropsych/cognition: This patient is capable of making decisions on his own behalf. 6. Skin/Wound Care: Routine pressure relief measures. Monitor incision for healing.  7. Fluids/Electrolytes/Nutrition: Monitor I/O. Check CMET in am             --check BMET 8. Neuropathy/band like pain around waist: Will increase Lyrica to TID 9. Osteomyelitis/discitis. Pending PICC placement.      -- Starting 6 weeks of daptomycin and ertapenem for 6 weeks through 02/20/24 to be followed by PO suppression doxycycline and ciprofloxacin.  10. HTN:  Monitor BP TID--on torsemide and coreg. 11. T2DM: Hgb A1C-7.9. Used [name] 50-120/ 5 units ac meal TID at home.              --Continue insulin glargine 20 units daily with SSI.  12.  Acute on chronic renal failure: SCr has had rise to 1.56-->will d/c celebrex.             --recheck BUN/SCr in am 13. Fever blisters: Will start valtrex as symptomatic. 14. Right shoulder pain: Question strain but heard a pop this am and extremely concerned that she may have torn her RTC again --Voltaren gel for local measures.  -- Consider MRI if no improvement  15. Constipation: Continue Senna--miralax was added on 04/04.    16. Depression/Anxiety: Continue Zoloft and Mirtazapine.   17. Neurogenic bladder: Continue foley. D/c in am if has results with laxative tonight.      Jacquelynn Cree, PA-C 01/11/2024  I have examined the patient independently and edited the  note for HPI, ROS, exam, assessment, and plan as appropriate. I am in agreement with the above recommendations.   [name], DO 01/11/2024

## 2024-01-11 NOTE — Progress Notes (Signed)
 [facility]  Date of Admission:  01/07/2024     Reason for Follow Up: Thoracic spondylosis with myelopathy  Total days of antibiotics          ASSESSMENT:  Katelyn [name]'s surgical specimens remain without growth and now POD #2. While no clear surgical evidence of infection, CT scan with concern for epidural phlegmon/abscess and discussed recommended plan of care to treat with 6 weeks of IV antibiotics with daptomycin and ertapenem followed by return to oral suppression. Renal function appears able to tolerate PICC line (GFR 44) although if needed can place central line. OPAT/Home Health orders. Therapeutic drug monitoring of CK levels. Monitor cultures for any organisms and narrow antibiotics as indicated. Will arrange follow up in ID clinic. Standard/universal precautions. Continue post-operative management and remaining medical and supportive care per Neurosurgery.   PLAN:  Change antibiotics to daptomycin and ertapenem. Monitor cultures for any organisms.   Place PICC line (central line okay if needed) Post-operative management per Neurosurgery.  Therapeutic drug monitoring of CK levels while on daptomycin. Universal/standard precautions.  Follow up in ID clinic. Remaining medical and supportive care per Neurosurgery.   Diagnosis: Epidural Phlegmon in setting of previous hardware   Culture Result: None current - previous Serratia   Allergies  Allergen Reactions   Atorvastatin Nausea And Vomiting and Other (See Comments)    MYALGIAS   Talwin [Pentazocine] Other (See Comments)    headache   Cefepime Other (See Comments)    Presumed AMS/neurotoxicity in the setting of AKI   Gabapentin Swelling   Other Nausea Only    UNSPECIFIED Anesthesia    OPAT Orders Discharge antibiotics to be given via PICC line Discharge antibiotics: Per pharmacy protocol Daptomycin and ertapenem  Aim for Vancomycin trough 15-20 or AUC 400-550 (unless otherwise  indicated) Duration: 6 weeks  End Date: 02/20/24  [facility] Care Per Protocol:  Home health RN for IV administration and teaching; PICC line care and labs.    Labs weekly while on IV antibiotics: _X_ CBC with differential __ BMP _X_ CMP _X_ CRP _X_ ESR __ Vancomycin trough _X_ CK  __ Please pull PIC at completion of IV antibiotics _X_ Please leave PIC in place until doctor has seen patient or been notified  Fax weekly labs to [phone]  Clinic Follow Up Appt:  4/21 at 3pm with Marcos Eke, NP  Principal Problem:   Thoracic spondylosis with myelopathy Active Problems:   CKD stage 3b, GFR 30-44 ml/min (HCC)    celecoxib  200 mg Oral Q12H   Chlorhexidine Gluconate Cloth  6 each Topical Daily   DULoxetine  60 mg Oral QHS   insulin aspart  0-20 Units Subcutaneous TID WC   insulin aspart  0-5 Units Subcutaneous QHS   insulin glargine-yfgn  20 Units Subcutaneous Daily   mirtazapine  30 mg Oral QHS   multivitamin with minerals  1 tablet Oral Daily   polyethylene glycol  17 g Oral Daily   pramipexole  1 mg Oral QHS   pregabalin  75 mg Oral BID   senna  1 tablet Oral BID   sodium chloride flush  3 mL Intravenous Q12H   torsemide  40 mg Oral Daily    SUBJECTIVE:  Afebrile overnight with no acute events. Tolerating antibiotics with no adverse side effects. Denies fever, chills, or sweats. Plan is for CIR. Sister at bedside.   Allergies  Allergen Reactions   Atorvastatin Nausea And Vomiting and Other (See Comments)  MYALGIAS   Talwin [Pentazocine] Other (See Comments)    headache   Cefepime Other (See Comments)    Presumed AMS/neurotoxicity in the setting of AKI   Gabapentin Swelling   Other Nausea Only    UNSPECIFIED Anesthesia     Review of Systems: Review of Systems  Constitutional:  Negative for chills, fever and weight loss.  Respiratory:  Negative for cough, shortness of breath and wheezing.   Cardiovascular:  Negative for chest pain and leg swelling.   Gastrointestinal:  Negative for abdominal pain, constipation, diarrhea, nausea and vomiting.  Skin:  Negative for rash.      OBJECTIVE: Vitals:   01/10/24 1614 01/10/24 1925 01/11/24 0449 01/11/24 0735  BP: (!) 134/57 136/65 (!) 158/64 (!) 166/61  Pulse: 71 85 90 73  Resp: 17 17 16 16   Temp: 98 F (36.7 C) 99 F (37.2 C) 98 F (36.7 C) 98.1 F (36.7 C)  TempSrc:  Oral Oral Oral  SpO2: 99% 97% 100% 97%  Weight:      Height:       Body mass index is 37.79 kg/m.  Physical Exam Constitutional:      General: She is not in acute distress.    Appearance: She is well-developed.  Cardiovascular:     Rate and Rhythm: Normal rate and regular rhythm.     Heart sounds: Normal heart sounds.  Pulmonary:     Effort: Pulmonary effort is normal.     Breath sounds: Normal breath sounds.  Skin:    General: Skin is warm and dry.  Neurological:     Mental Status: She is alert and oriented to person, place, and time.     Lab Results Lab Results  Component Value Date   WBC 8.2 01/07/2024   HGB 8.8 (L) 01/09/2024   HCT 26.0 (L) 01/09/2024   MCV 89.9 01/07/2024   PLT 191 01/07/2024    Lab Results  Component Value Date   CREATININE 1.33 (H) 01/10/2024   BUN 41 (H) 01/07/2024   NA 136 01/09/2024   K 4.4 01/09/2024   CL 99 01/07/2024   CO2 28 01/07/2024    Lab Results  Component Value Date   ALT 55 (H) 01/01/2024   AST 48 (H) 01/01/2024   ALKPHOS 234 (H) 01/01/2024   BILITOT 0.5 01/01/2024     Microbiology: Recent Results (from the past 240 hours)  MRSA Next Gen by PCR, Nasal     Status: None   Collection Time: 01/09/24 11:35 AM   Specimen: Nasal Mucosa; Nasal Swab  Result Value Ref Range Status   MRSA by PCR Next Gen NOT DETECTED NOT DETECTED Final    Comment: (NOTE) The GeneXpert MRSA Assay (FDA approved for NASAL specimens only), is one component of a comprehensive MRSA colonization surveillance program. It is not intended to diagnose MRSA infection nor to  guide or monitor treatment for MRSA infections. Test performance is not FDA approved in patients less than 43 years old. Performed at [facility], [address] [place], [place] [address]   Aerobic/Anaerobic Culture w Gram Stain (surgical/deep wound)     Status: None (Preliminary result)   Collection Time: 01/09/24  4:34 PM   Specimen: Intervertebral Disc; Tissue  Result Value Ref Range Status   Specimen Description WOUND  Final   Special Requests INTERVERTERAL DISC 9,10 PT ON ANCEF  Final   Gram Stain NO WBC SEEN NO ORGANISMS SEEN   Final   Culture   Final  NO GROWTH 2 DAYS NO ANAEROBES ISOLATED; CULTURE IN PROGRESS FOR 5 DAYS Performed at [facility], [address] [place], [place] [address]    Report Status PENDING  Incomplete  Aerobic/Anaerobic Culture w Gram Stain (surgical/deep wound)     Status: None (Preliminary result)   Collection Time: 01/09/24  4:44 PM   Specimen: Intervertebral Disc; Tissue  Result Value Ref Range Status   Specimen Description WOUND  Final   Special Requests INTERVERTEBRAL DISC 9,10 SWAB PT ON ANCEF  Final   Gram Stain NO WBC SEEN NO ORGANISMS SEEN   Final   Culture   Final    NO GROWTH 2 DAYS NO ANAEROBES ISOLATED; CULTURE IN PROGRESS FOR 5 DAYS Performed at [facility], [address] [place], [place] [address]    Report Status PENDING  Incomplete     Marcos Eke, NP [facility]  01/11/2024  2:25 PM

## 2024-01-11 NOTE — Progress Notes (Signed)
 PHARMACY CONSULT NOTE FOR:  OUTPATIENT  PARENTERAL ANTIBIOTIC THERAPY (OPAT)  Indication: Epidural abscess/discitis Regimen: Daptomycin 500 mg IV every 24 hours + Ertapenem 1g IV every 24 hours End date: 02/20/24 (6 weeks from OR 01/09/24)  IV antibiotic discharge orders are pended. To discharging provider:  please sign these orders via discharge navigator,  Select New Orders & click on the button choice - Manage This Unsigned Work.     Thank you for allowing pharmacy to be a part of this patient's care.  Georgina Pillion, PharmD, BCPS, BCIDP Infectious Diseases Clinical Pharmacist 01/11/2024 11:29 AM   **Pharmacist phone directory can now be found on [email or web address] ([email or web address]).  Listed under [facility].

## 2024-01-11 NOTE — Progress Notes (Signed)
 Physical Therapy Treatment Patient Details Name: Katelyn [name] MRN: [id number] DOB: Aug 19, 1956 Today's Date: 01/11/2024   History of Present Illness 68 yo female presents to [facility] on 3/31 with LE weakness, foot drop. Myelogram 4/1 shows significant stenosis of the T10 level where she has sequelae of discitis. S/p T7-L1 fusion, removal of T10 screws on 4/2.  PMH:  T11-12, T10-L2 posterior fixation and removal of spinal stimulator 12/24; multiple lumbar surgeries, anxiety, chronic low back pain with multiple lumbar surgeries,  DM2, obesity, OSA, LBBB, HTN, hypercholesterolemia, CKD, CAD.    PT Comments  Pt on BSC upon PT arrival to room. Pt endorsing Les are feeling stronger, but she is still fearful of falling when standing. Pt tolerating step pivot from BSC to EOB with mod assist for steadying, pt increasingly anxious but tolerates well. Pt stood x2 this date, progressing well vs eval. Pt remains a good AIR candidate.      If plan is discharge home, recommend the following: A lot of help with walking and/or transfers;A lot of help with bathing/dressing/bathroom;Assistance with cooking/housework;Assist for transportation;Help with stairs or ramp for entrance   Can travel by private vehicle        Equipment Recommendations  None recommended by PT    Recommendations for Other Services       Precautions / Restrictions Precautions Precautions: Fall Recall of Precautions/Restrictions: Impaired Required Braces or Orthoses: Spinal Brace (pt declines wearing TLSO for bed<>BSC transfers) Spinal Brace: Thoracolumbosacral orthotic;Applied in sitting position Restrictions Weight Bearing Restrictions Per Provider Order: No     Mobility  Bed Mobility Overal bed mobility: Needs Assistance Bed Mobility: Rolling, Sit to Sidelying Rolling: Mod assist       Sit to sidelying: Mod assist, +2 for physical assistance General bed mobility comments: mod assist for LE elevation in bed, min truncal assist  +2. log roll technique utilized.    Transfers Overall transfer level: Needs assistance Equipment used: Rolling walker (2 wheels) Transfers: Sit to/from Stand, Bed to chair/wheelchair/BSC Sit to Stand: Mod assist, +2 safety/equipment   Step pivot transfers: Mod assist       General transfer comment: assist for power up via sacral facilitation, rise, steadying. Stand x2, from EOB and BSC. step pivot towards R with assist to steady, maneuver RW. Lateral stepping at EOB    Ambulation/Gait                   Stairs             Wheelchair Mobility     Tilt Bed    Modified Rankin (Stroke Patients Only)       Balance Overall balance assessment: Needs assistance Sitting-balance support: Bilateral upper extremity supported, Feet supported Sitting balance-Leahy Scale: Fair Sitting balance - Comments: posterior bias with fatigue, but pt able to correct this date Postural control: Posterior lean Standing balance support: Bilateral upper extremity supported, Reliant on assistive device for balance Standing balance-Leahy Scale: Poor                              Communication Communication Communication: No apparent difficulties  Cognition Arousal: Alert Behavior During Therapy: Anxious   PT - Cognitive impairments: Attention, Problem solving, Safety/Judgement                       PT - Cognition Comments: tangential in conversation. Fearful of falling, exacerbated during dynamic standing tasks Following commands: Intact  Cueing Cueing Techniques: Verbal cues, Tactile cues  Exercises      General Comments General comments (skin integrity, edema, etc.): wound vac and hemovac      Pertinent Vitals/Pain Pain Assessment Pain Assessment: Faces Faces Pain Scale: Hurts little more Pain Location: back Pain Descriptors / Indicators: Operative site guarding, Discomfort, Sore Pain Intervention(s): Limited activity within patient's  tolerance, Monitored during session, Repositioned, Premedicated before session    Home Living                          Prior Function            PT Goals (current goals can now be found in the care plan section) Acute Rehab PT Goals Patient Stated Goal: AIR PT Goal Formulation: With patient/family Time For Goal Achievement: 01/24/24 Potential to Achieve Goals: Good Progress towards PT goals: Progressing toward goals    Frequency    Min 5X/week      PT Plan      Co-evaluation              AM-PAC PT "6 Clicks" Mobility   Outcome Measure  Help needed turning from your back to your side while in a flat bed without using bedrails?: A Lot Help needed moving from lying on your back to sitting on the side of a flat bed without using bedrails?: A Lot Help needed moving to and from a bed to a chair (including a wheelchair)?: A Lot Help needed standing up from a chair using your arms (e.g., wheelchair or bedside chair)?: A Lot Help needed to walk in hospital room?: Total Help needed climbing 3-5 steps with a railing? : Total 6 Click Score: 10    End of Session Equipment Utilized During Treatment: Gait belt (pt declines back brace for to/from [facility]) Activity Tolerance: Patient tolerated treatment well;Patient limited by fatigue Patient left: in bed;with call bell/phone within reach;with bed alarm set Nurse Communication: Mobility status PT Visit Diagnosis: Other abnormalities of gait and mobility (R26.89);Muscle weakness (generalized) (M62.81)     Time: 1610-9604 PT Time Calculation (min) (ACUTE ONLY): 29 min  Charges:    $Therapeutic Activity: 23-37 mins PT General Charges $$ ACUTE PT VISIT: 1 Visit                     Katelyn [name], PT DPT Acute Rehabilitation Services Secure Chat Preferred  Office [phone]    Katelyn [name] Sheliah Plane 01/11/2024, 4:26 PM

## 2024-01-11 NOTE — Progress Notes (Signed)
 PICC order noted, Fleet Contras, RN made aware that PICC will be placed later today or tomorrow.  Fleet Contras alerted that patient will be moving to [employer] shortly.

## 2024-01-11 NOTE — Discharge Summary (Signed)
 Physician Discharge Summary  Patient ID: Katelyn [name] MRN: [id number] DOB/AGE: [birth year] 66 y.o.  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  She was admitted with acute leg weakness.  the patient tolerated the procedure well and was taken to the recovery room and then to the floor in stable condition. The hospital course was routine. There were no complications. The wound remained clean dry and intact. Pt had appropriate back soreness. No complaints of leg pain or new N/T/W, and her preoperative weakness improved quite a bit by postop day 2. The patient remained afebrile with stable vital signs, and tolerated a regular diet. The patient continued to increase activities, and pain was well controlled with oral pain medications.  Infectious disease was consulted and started her on daptomycin and Invanz.  CIR was consulted and she was accepted to rehab today.  Consults: ID and rehabilitation medicine  Significant Diagnostic Studies:  Results for orders placed or performed during the hospital encounter of 01/07/24  Basic metabolic panel   Collection Time: 01/07/24  7:59 PM  Result Value Ref Range   Sodium 138 135 - 145 mmol/L   Potassium 4.1 3.5 - 5.1 mmol/L   Chloride 99 98 - 111 mmol/L   CO2 28 22 - 32 mmol/L   Glucose, Bld 200 (H) 70 - 99 mg/dL   BUN 41 (H) 8 - 23 mg/dL   Creatinine, Ser 1.32 (H) 0.44 - 1.00 mg/dL   Calcium 9.2 8.9 - 44.0 mg/dL   GFR, Estimated 37 (L) >60 mL/min   Anion gap 11 5 - 15  CBC with Differential   Collection Time: 01/07/24  7:59 PM  Result Value Ref Range   WBC 8.2 4.0 - 10.5 K/uL   RBC 3.47 (L) 3.87 - 5.11 MIL/uL    Hemoglobin 10.2 (L) 12.0 - 15.0 g/dL   HCT 10.2 (L) 72.5 - 36.6 %   MCV 89.9 80.0 - 100.0 fL   MCH 29.4 26.0 - 34.0 pg   MCHC 32.7 30.0 - 36.0 g/dL   RDW 44.0 (H) 34.7 - 42.5 %   Platelets 191 150 - 400 K/uL   nRBC 0.0 0.0 - 0.2 %   Neutrophils Relative % 59 %   Neutro Abs 4.8 1.7 - 7.7 K/uL   Lymphocytes Relative 23 %   Lymphs Abs 1.9 0.7 - 4.0 K/uL   Monocytes Relative 12 %   Monocytes Absolute 1.0 0.1 - 1.0 K/uL   Eosinophils Relative 5 %   Eosinophils Absolute 0.4 0.0 - 0.5 K/uL   Basophils Relative 1 %   Basophils Absolute 0.1 0.0 - 0.1 K/uL   Immature Granulocytes 0 %   Abs Immature Granulocytes 0.03 0.00 - 0.07 K/uL  Glucose, capillary   Collection Time: 01/07/24  9:28 PM  Result Value Ref Range   Glucose-Capillary 146 (H) 70 - 99 mg/dL  Glucose, capillary   Collection Time: 01/08/24  6:25 AM  Result Value Ref Range   Glucose-Capillary 334 (H) 70 - 99 mg/dL  Glucose, capillary   Collection Time: 01/08/24 12:56 PM  Result Value Ref Range   Glucose-Capillary 296 (H) 70 - 99 mg/dL  Glucose, capillary   Collection Time: 01/08/24  5:39 PM  Result Value  Ref Range   Glucose-Capillary 388 (H) 70 - 99 mg/dL  Glucose, capillary   Collection Time: 01/09/24  6:53 AM  Result Value Ref Range   Glucose-Capillary 372 (H) 70 - 99 mg/dL   Comment 1 Notify RN   Glucose, capillary   Collection Time: 01/09/24 11:12 AM  Result Value Ref Range   Glucose-Capillary 342 (H) 70 - 99 mg/dL  MRSA Next Gen by PCR, Nasal   Collection Time: 01/09/24 11:35 AM   Specimen: Nasal Mucosa; Nasal Swab  Result Value Ref Range   MRSA by PCR Next Gen NOT DETECTED NOT DETECTED  Type and screen [facility]   Collection Time: 01/09/24 12:30 PM  Result Value Ref Range   ABO/RH(D) A POS    Antibody Screen NEG    Sample Expiration 01/12/2024,2359    Unit Number [id number]    Blood Component Type RED CELLS,LR    Unit division 00    Status of Unit ALLOCATED    Transfusion Status  OK TO TRANSFUSE    Crossmatch Result Compatible    Unit Number [id number]    Blood Component Type RED CELLS,LR    Unit division 00    Status of Unit ISSUED,FINAL    Transfusion Status OK TO TRANSFUSE    Crossmatch Result      Compatible Performed at [facility], [address] [place], [place] [address]   [facility]   Collection Time: 01/09/24 12:30 PM  Result Value Ref Range   ISSUE DATE / TIME [id number]    Blood Product Unit Number [id number]    PRODUCT CODE [id number]    Unit Type and Rh 6200    Blood Product Expiration Date [id number]    ISSUE DATE / TIME 332951884166    Blood Product Unit Number [phone]    PRODUCT CODE [id number]    Unit Type and Rh 6200    Blood Product Expiration Date [id number]   Glucose, capillary   Collection Time: 01/09/24  2:13 PM  Result Value Ref Range   Glucose-Capillary 232 (H) 70 - 99 mg/dL   Comment 1 Notify RN    Comment 2 Document in Chart   Aerobic/Anaerobic Culture w Gram Stain (surgical/deep wound)   Collection Time: 01/09/24  4:34 PM   Specimen: Intervertebral Disc; Tissue  Result Value Ref Range   Specimen Description WOUND    Special Requests INTERVERTERAL DISC 9,10 PT ON ANCEF    Gram Stain NO WBC SEEN NO ORGANISMS SEEN     Culture      NO GROWTH 2 DAYS NO ANAEROBES ISOLATED; CULTURE IN PROGRESS FOR 5 DAYS Performed at [facility], [address] [place], [place] [address]    Report Status PENDING   Prepare RBC (crossmatch)   Collection Time: 01/09/24  4:34 PM  Result Value Ref Range   Order Confirmation      ORDER PROCESSED BY BLOOD BANK Performed at [facility], [address] [place], [place] [address]   Aerobic/Anaerobic Culture w Gram Stain (surgical/deep wound)   Collection Time: 01/09/24  4:44 PM   Specimen: Intervertebral Disc; Tissue  Result Value Ref Range   Specimen Description WOUND    Special Requests INTERVERTEBRAL DISC 9,10 SWAB PT ON ANCEF    Gram Stain NO WBC  SEEN NO ORGANISMS SEEN     Culture      NO GROWTH 2 DAYS NO ANAEROBES ISOLATED; CULTURE IN PROGRESS FOR 5 DAYS Performed at [facility]  [facility], [address], [place], [place] [address]    Report Status PENDING   I-STAT 7, (LYTES, BLD GAS, ICA, H+H)   Collection Time: 01/09/24  4:56 PM  Result Value Ref Range   pH, Arterial 7.452 (H) 7.35 - 7.45   pCO2 arterial 37.4 32 - 48 mmHg   pO2, Arterial 266 (H) 83 - 108 mmHg   Bicarbonate 26.3 20.0 - 28.0 mmol/L   TCO2 28 22 - 32 mmol/L   O2 Saturation 100 %   Acid-Base Excess 2.0 0.0 - 2.0 mmol/L   Sodium 136 135 - 145 mmol/L   Potassium 4.4 3.5 - 5.1 mmol/L   Calcium, Ion 1.18 1.15 - 1.40 mmol/L   HCT 26.0 (L) 36.0 - 46.0 %   Hemoglobin 8.8 (L) 12.0 - 15.0 g/dL   Patient temperature 60.4 C    Sample type ARTERIAL   Glucose, capillary   Collection Time: 01/09/24  6:16 PM  Result Value Ref Range   Glucose-Capillary 234 (H) 70 - 99 mg/dL   Comment 1 Notify RN   Glucose, capillary   Collection Time: 01/09/24  8:16 PM  Result Value Ref Range   Glucose-Capillary 248 (H) 70 - 99 mg/dL   Comment 1 Notify RN   Creatinine, serum   Collection Time: 01/10/24  6:08 AM  Result Value Ref Range   Creatinine, Ser 1.33 (H) 0.44 - 1.00 mg/dL   GFR, Estimated 44 (L) >60 mL/min  Glucose, capillary   Collection Time: 01/10/24  6:36 AM  Result Value Ref Range   Glucose-Capillary 318 (H) 70 - 99 mg/dL  Glucose, capillary   Collection Time: 01/10/24 11:47 AM  Result Value Ref Range   Glucose-Capillary 368 (H) 70 - 99 mg/dL  Glucose, capillary   Collection Time: 01/10/24  4:11 PM  Result Value Ref Range   Glucose-Capillary 319 (H) 70 - 99 mg/dL  Glucose, capillary   Collection Time: 01/10/24  9:47 PM  Result Value Ref Range   Glucose-Capillary 342 (H) 70 - 99 mg/dL   Comment 1 Notify RN    Comment 2 Document in Chart   Sedimentation rate   Collection Time: 01/11/24  5:24 AM  Result Value Ref Range   Sed Rate 13 0 - 22 mm/hr  C-reactive  protein   Collection Time: 01/11/24  5:24 AM  Result Value Ref Range   CRP 2.6 (H) <1.0 mg/dL  CK   Collection Time: 01/11/24  5:24 AM  Result Value Ref Range   Total CK 55 38 - 234 U/L  Glucose, capillary   Collection Time: 01/11/24  6:13 AM  Result Value Ref Range   Glucose-Capillary 204 (H) 70 - 99 mg/dL   Comment 1 Notify RN    Comment 2 Document in Chart   Glucose, capillary   Collection Time: 01/11/24 11:26 AM  Result Value Ref Range   Glucose-Capillary 230 (H) 70 - 99 mg/dL    [place] EKG SITE RITE Result Date: 01/11/2024 If Site Rite image not attached, placement could not be confirmed due to current cardiac rhythm.  DG Thoracic Spine 2 View Result Date: 01/09/2024 CLINICAL DATA:  Elective surgery. EXAM: THORACIC SPINE 2 VIEWS COMPARISON:  Preoperative imaging FINDINGS: Five fluoroscopic spot views from the operating room submitted. Portions of thoracolumbar fusion hardware visualized, now extending cranially into the T7 level. Fluoroscopy time 74.5 seconds. Dose 26.05 mGy. Portions of a spinal stimulator are seen. IMPRESSION: Intraoperative fluoroscopy during thoracic spine surgery. Electronically Signed   By: Shawna Orleans  Sanford M.D.   On: 01/09/2024 18:20   DG C-Arm 1-60 Min-No Report Result Date: 01/09/2024 Fluoroscopy was utilized by the requesting physician.  No radiographic interpretation.   DG C-Arm 1-60 Min-No Report Result Date: 01/09/2024 Fluoroscopy was utilized by the requesting physician.  No radiographic interpretation.   DG C-Arm 1-60 Min-No Report Result Date: 01/09/2024 Fluoroscopy was utilized by the requesting physician.  No radiographic interpretation.   CT THORACIC SPINE W CONTRAST Result Date: 01/08/2024 CLINICAL DATA:  Mid back pain.  Myelogram.  Osteomyelitis. EXAM: CT THORACIC SPINE WITH CONTRAST TECHNIQUE: Multidetector CT images of thoracic was performed according to the standard protocol following intrathecal contrast administration. RADIATION DOSE  REDUCTION: This exam was performed according to the departmental dose-optimization program which includes automated exposure control, adjustment of the mA and/or kV according to patient size and/or use of iterative reconstruction technique. CONTRAST:  10mL OMNIPAQUE IOHEXOL 300 MG/ML  SOLN COMPARISON:  Thoracic spine CT 01/08/2024, 01/07/2024 FINDINGS: Alignment: Unchanged grade 1 retrolisthesis at T11-12. Vertebrae: Erosive endplate changes at T9-10 and T11-12, unchanged. Lucency about the T10 transpedicular screws, greater on the left. Paraspinal and other soft tissues: Edema within the paraspinal soft tissues at T9-10. Disc levels: The thecal sac is severely attenuated at the T9-10 level by soft tissue density material, likely epidural phlegmon/abscess. Thecal sac caliber is normal below the T9-10 level. There is right asymmetric clumping of the nerve roots within the upper lumbar spinal canal. IMPRESSION: 1. Severe thecal sac attenuation at the T9-10 level by soft tissue density material, likely epidural phlegmon/abscess. 2. 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 3. Unchanged endplate erosion at T9-10 and T11-12 Electronically Signed   By: Deatra Robinson M.D.   On: 01/08/2024 20:43   CT THORACIC SPINE WO CONTRAST Result Date: 01/08/2024 CLINICAL DATA:  Mid back pain. 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 Previous thoracolumbar fusion and thoracic spinal stimulator placement. EXAM: CT THORACIC SPINE WITHOUT CONTRAST TECHNIQUE: Multidetector CT images of the thoracic were obtained using the standard protocol without intravenous contrast. RADIATION DOSE REDUCTION: This exam was performed according to the departmental dose-optimization program which includes automated exposure control, adjustment of the mA and/or kV according to patient size and/or use of iterative reconstruction technique. COMPARISON:  CT thoracic spine 01/07/2024, 12/19/2023 and  02/14/2023. FINDINGS: Alignment: Stable chronic retrolisthesis at T11-12. Vertebrae: Status post extensive thoracolumbar fusion extending inferiorly from T10. There is loosening of the T10 pedicle screws, greater on the left. No hardware displacement identified. The interconnecting rods are intact. Irregular endplate destruction at T9-10 again noted consistent with discitis/osteomyelitis. Sequela of remote discitis and osteomyelitis at T11-12 appears unchanged. There is chronic ankylosis across the L1-2 disc. Spinal stimulator remains in place at T6-7. Paraspinal and other soft tissues: Unchanged paraspinal inflammatory changes at T9-10 without focal fluid collection. Disc levels: Unchanged multilevel thoracic spondylosis with disc space narrowing and endplate osteophytes. No evidence of large disc herniation, significant spinal stenosis or foraminal narrowing in the upper thoracic spine. There is a mass effect on the thecal sac at T9-10 with moderate foraminal narrowing, greater on the left. Moderate chronic osseous foraminal narrowing on the left at T11-12 appears unchanged. IMPRESSION: 1. No change from thoracic spine CT of the previous day. 2. Unchanged appearance of discitis/osteomyelitis at T9-10 with paraspinal inflammatory changes. No focal fluid collection identified. There is mass effect on the thecal sac with left greater than right foraminal narrowing. Unchanged loosening of the T10 pedicle screws, greater on the left.  3. Unchanged appearance of remote discitis and osteomyelitis at T11-12. 4. No acute osseous findings. 5. Unchanged multilevel thoracic spondylosis. Electronically Signed   By: Carey Bullocks M.D.   On: 01/08/2024 17:02   DG FL GUIDED LP INJECT MYELOGRAM THORACIC Result Date: 01/08/2024 CLINICAL DATA:  Thoracolumbar posterior fusion, thoracic spondylosis with myelopathy FLUOROSCOPY: 1 minute 36 seconds, 38.1 mg PROCEDURE: LUMBAR PUNCTURE FOR THORACIC MYELOGRAM After thorough discussion  of risks and benefits of the procedure including bleeding, infection, injury to nerves, blood vessels, adjacent structures as well as headache and CSF leak, written and oral informed consent was obtained. Consent was obtained by Dr. Judie Petit. Shick. Patient was positioned prone on the fluoroscopy table. Local anesthesia was provided with 1% lidocaine without epinephrine after prepped and draped in the usual sterile fashion. Puncture was performed at L1-2 using a 3 1/2 inch 20 gauge spinal needle via midline approach. Using a single pass through the dura, the needle was placed within the thecal sac, with return of clear CSF. 10 mL of omni 300 was injected into the thecal sac, with normal opacification of the nerve roots and cauda equina consistent with free flow within the subarachnoid space. The patient was then moved to the trendelenburg position and contrast flowed into the Thoracic spine region. I personally performed the lumbar puncture and administered the intrathecal contrast. I also personally supervised acquisition of the myelogram images. TECHNIQUE: Spot fluoroscopic imaging performed for documentation. FINDINGS: THORACIC MYELOGRAM FINDINGS: Imaging confirms needle placement at the L1-2 level. Clear CSF obtained. Successful thecal injection of the 10 cc Omnipaque 300 contrast. Good dispersal initially in the lumbar region. Trendelenburg position allowed good dispersal throughout the thoracic region. Next, thoracic CT imaging will be performed. IMPRESSION: Successful lumbar puncture for thoracic myelogram. Electronically Signed   By: Judie Petit.  Shick M.D.   On: 01/08/2024 16:55   CT THORACIC SPINE W CONTRAST Result Date: 01/07/2024 CLINICAL DATA:  Mid back pain EXAM: CT THORACIC SPINE WITH CONTRAST TECHNIQUE: Multidetector CT images of thoracic was performed according to the standard protocol following intravenous contrast administration. RADIATION DOSE REDUCTION: This exam was performed according to the departmental  dose-optimization program which includes automated exposure control, adjustment of the mA and/or kV according to patient size and/or use of iterative reconstruction technique. CONTRAST:  75mL OMNIPAQUE IOHEXOL 350 MG/ML SOLN COMPARISON:  12/19/2023 FINDINGS: Alignment: Grade 1 retrolisthesis at T11-12 Vertebrae: Erosive endplate changes at T9-10 and T11-12 are unchanged since 12/19/2023. Lucency about the left T10 transpedicular screws unchanged. Paraspinal and other soft tissues: Spinal cord stimulator enters the epidural space at the T8-9 level. There is edema within the soft tissues surrounding the T9 and T10 vertebral bodies. Disc levels: There is poorly defined epidural contrast enhancement at the T9-10 level. IMPRESSION: 1. Erosive endplate changes at T9-10 and T11-12 are unchanged since 12/19/2023 and remain concerning for discitis-osteomyelitis. 2. Poorly defined epidural contrast enhancement at the T9-10 level, concerning for epidural abscess. MRI with and without contrast would be more definitive. 3. Lucency about the left T10 transpedicular screws unchanged. Electronically Signed   By: Deatra Robinson M.D.   On: 01/07/2024 22:27    Antibiotics:  Anti-infectives (From admission, onward)    Start     Dose/Rate Route Frequency Ordered Stop   01/11/24 1400  ertapenem (INVANZ) 1 g in sodium chloride 0.9 % 100 mL IVPB        1 g 200 mL/hr over 30 Minutes Intravenous Daily 01/11/24 1346     01/10/24 1815  ertapenem (  INVANZ) 1 g in sodium chloride 0.9 % 100 mL IVPB  Status:  Discontinued        1 g 200 mL/hr over 30 Minutes Intravenous Every 24 hours 01/10/24 1715 01/11/24 1346   01/10/24 1815  DAPTOmycin (CUBICIN) IVPB 500 mg/35mL premix        8 mg/kg  65 kg (Adjusted) 100 mL/hr over 30 Minutes Intravenous Daily 01/10/24 1715     01/10/24 0800  ceFAZolin (ANCEF) IVPB 2g/100 mL premix        2 g 200 mL/hr over 30 Minutes Intravenous To [facility] 01/09/24 1040 01/09/24 1447   01/09/24  2200  vancomycin (VANCOREADY) IVPB 1250 mg/250 mL  Status:  Discontinued        1,250 mg 166.7 mL/hr over 90 Minutes Intravenous Every 48 hours 01/09/24 2020 01/10/24 1431   01/09/24 1701  vancomycin (VANCOCIN) powder  Status:  Discontinued          As needed 01/09/24 1702 01/09/24 1809   01/09/24 1219  ceFAZolin (ANCEF) 2-4 GM/100ML-% IVPB  Status:  Discontinued       Note to Pharmacy: Effie Shy: cabinet override      01/09/24 1219 01/09/24 1247   01/07/24 2200  ciprofloxacin (CIPRO) tablet 500 mg  Status:  Discontinued        500 mg Oral 2 times daily 01/07/24 1805 01/10/24 1715   01/07/24 2200  doxycycline (VIBRA-TABS) tablet 100 mg  Status:  Discontinued       Note to Pharmacy: Start taking 04/04/23     100 mg Oral 2 times daily 01/07/24 1805 01/10/24 1715       Discharge Exam: Blood pressure (!) 166/61, pulse 73, temperature 98.1 F (36.7 C), temperature source Oral, resp. rate 16, height 5\' 1"  (1.549 m), weight 90.7 kg, SpO2 97%. Neurologic: Grossly normal extremities with improving strength in the lower extremities Dressing dry and negative pressure external wound VAC in place  Discharge Medications:   Allergies as of 01/11/2024       Reactions   Atorvastatin Nausea And Vomiting, Other (See Comments)   MYALGIAS   Talwin [pentazocine] Other (See Comments)   headache   Cefepime Other (See Comments)   Presumed AMS/neurotoxicity in the setting of AKI   Gabapentin Swelling   Other Nausea Only   UNSPECIFIED Anesthesia        Medication List     STOP taking these medications    ciprofloxacin 500 MG tablet Commonly known as: CIPRO   cyclobenzaprine 5 MG tablet Commonly known as: FLEXERIL   doxycycline 100 MG tablet Commonly known as: VIBRA-TABS       TAKE these medications    carvedilol 6.25 MG tablet Commonly known as: COREG Take 1 tablet (6.25 mg total) by mouth 2 (two) times daily. Take 6.25 mg by mouth 2 (two) times daily. / Needs appointment for  further refills   DULoxetine 60 MG capsule Commonly known as: CYMBALTA Take 60 mg by mouth at bedtime.   FreeStyle Libre 3 Plus Sensor Misc Inject 1 Application into the skin.   Hair Skin and Nails Formula Tabs Take 1 tablet by mouth daily.   loratadine 10 MG tablet Commonly known as: CLARITIN Take 10 mg by mouth daily.   milk thistle 175 MG tablet Take 175 mg by mouth in the morning and at bedtime.   mirtazapine 30 MG tablet Commonly known as: REMERON Take 30 mg by mouth at bedtime.   multivitamin with minerals Tabs tablet Take  1 tablet by mouth daily.   nitroGLYCERIN 0.4 MG SL tablet Commonly known as: NITROSTAT Place 0.4 mg under the tongue every 5 (five) minutes as needed for chest pain.   NovoLOG ReliOn 100 UNIT/ML injection Generic drug: insulin aspart Inject 2-20 Units into the skin 3 (three) times daily with meals.   oxyCODONE 5 MG immediate release tablet Commonly known as: Oxy IR/ROXICODONE Take 0.5 tablets (2.5 mg total) by mouth every 6 (six) hours as needed for severe pain.   pantoprazole 40 MG tablet Commonly known as: PROTONIX Take 1 tablet (40 mg total) by mouth daily.   pramipexole 1 MG tablet Commonly known as: MIRAPEX Take 1 mg by mouth at bedtime.   pregabalin 75 MG capsule Commonly known as: LYRICA Take 75 mg by mouth 2 (two) times daily.   tirzepatide 5 MG/0.5ML Pen Commonly known as: MOUNJARO Inject 5 mg into the skin once a week.   torsemide 20 MG tablet Commonly known as: DEMADEX Take 40 mg by mouth daily.   [name] FlexTouch 200 UNIT/ML FlexTouch Pen Generic drug: insulin degludec Inject 20 Units into the skin at bedtime. What changed: how much to take               Durable Medical Equipment  (From admission, onward)           Start     Ordered   01/09/24 1954  DME Walker rolling  Once       Question:  Patient needs a walker to treat with the following condition  Answer:  S/P lumbar fusion   01/09/24 1953    01/09/24 1954  DME 3 n 1  Once        01/09/24 1953              Discharge Care Instructions  (From admission, onward)           Start     Ordered   2024-01-26 0000  Discharge wound care:       Comments: Continue negative pressure wound dressing until battery dies and then remove   01/26/2024 1533            Disposition: CIR, continue daptomycin and Invanz as per infectious disease   Final Dx: Thoracic fusion, thoracic myelopathy with leg weakness, discitis osteomyelitis  Discharge Instructions     Call MD for:  difficulty breathing, headache or visual disturbances   Complete by: As directed    Call MD for:  persistant nausea and vomiting   Complete by: As directed    Call MD for:  redness, tenderness, or signs of infection (pain, swelling, redness, odor or green/yellow discharge around incision site)   Complete by: As directed    Call MD for:  severe uncontrolled pain   Complete by: As directed    Call MD for:  temperature >100.4   Complete by: As directed    Diet - low sodium heart healthy   Complete by: As directed    Discharge instructions   Complete by: As directed    Continue daptomycin and Invanz as as prescribed by infectious disease   Discharge wound care:   Complete by: As directed    Continue negative pressure wound dressing until battery dies and then remove   Increase activity slowly   Complete by: As directed         Follow-up Information     Dough, Doris Cheadle, MD Follow up.   Specialty: Family Medicine Contact information: [address]  [address] [place] [address] [phone]                  Signed: Tia Alert 01/11/2024, 3:34 PM

## 2024-01-11 NOTE — Progress Notes (Signed)
 Patient ID: Katelyn [name], female   DOB: 07-05-1956, 68 y.o.   MRN: [id number] She looks better.  She has fairly equal strength in her toes and feet bilaterally now.  Flexors 2 out of 5 bilaterally, knee extensors 4 out of 5 bilaterally, knee flexors 4- out of 5.  She is having some burning right thoracic radicular pain when she sits up  Overall and pleased with her progress.  She has already had an improvement in her lower extremity strength.  Continue to mobilize as tolerated.  I think she will need CIR.

## 2024-01-11 NOTE — Progress Notes (Signed)
 Inpatient Rehab Admissions Coordinator:   Following for CIR.  Awaiting determination from [facility].  I do have a bed for her today if she is approved.   Estill Dooms, PT, DPT Admissions Coordinator [phone] 01/11/24  11:37 AM

## 2024-01-11 NOTE — Progress Notes (Signed)
 Inpatient Rehab Admissions Coordinator:    I have insurance approval and a bed available for pt to admit to CIR today. Dr. Yetta Barre and Leo Grosser, NP, in agreement.  Will let pt/family and TOC team know.   Estill Dooms, PT, DPT Admissions Coordinator [phone] 01/11/24  2:25 PM

## 2024-01-12 ENCOUNTER — Encounter (HOSPITAL_COMMUNITY)

## 2024-01-12 DIAGNOSIS — M4714 Other spondylosis with myelopathy, thoracic region: Secondary | ICD-10-CM | POA: Diagnosis not present

## 2024-01-12 DIAGNOSIS — I10 Essential (primary) hypertension: Secondary | ICD-10-CM | POA: Diagnosis not present

## 2024-01-12 DIAGNOSIS — R739 Hyperglycemia, unspecified: Secondary | ICD-10-CM | POA: Diagnosis not present

## 2024-01-12 DIAGNOSIS — K5901 Slow transit constipation: Secondary | ICD-10-CM | POA: Diagnosis not present

## 2024-01-12 LAB — CBC WITH DIFFERENTIAL/PLATELET
Abs Immature Granulocytes: 0.03 10*3/uL (ref 0.00–0.07)
Basophils Absolute: 0 10*3/uL (ref 0.0–0.1)
Basophils Relative: 0 %
Eosinophils Absolute: 0.5 10*3/uL (ref 0.0–0.5)
Eosinophils Relative: 7 %
HCT: 32.1 % — ABNORMAL LOW (ref 36.0–46.0)
Hemoglobin: 10.5 g/dL — ABNORMAL LOW (ref 12.0–15.0)
Immature Granulocytes: 0 %
Lymphocytes Relative: 20 %
Lymphs Abs: 1.5 10*3/uL (ref 0.7–4.0)
MCH: 29.6 pg (ref 26.0–34.0)
MCHC: 32.7 g/dL (ref 30.0–36.0)
MCV: 90.4 fL (ref 80.0–100.0)
Monocytes Absolute: 0.7 10*3/uL (ref 0.1–1.0)
Monocytes Relative: 9 %
Neutro Abs: 4.9 10*3/uL (ref 1.7–7.7)
Neutrophils Relative %: 64 %
Platelets: 107 10*3/uL — ABNORMAL LOW (ref 150–400)
RBC: 3.55 MIL/uL — ABNORMAL LOW (ref 3.87–5.11)
RDW: 15.5 % (ref 11.5–15.5)
WBC: 7.7 10*3/uL (ref 4.0–10.5)
nRBC: 0 % (ref 0.0–0.2)

## 2024-01-12 LAB — COMPREHENSIVE METABOLIC PANEL WITH GFR
ALT: 33 U/L (ref 0–44)
AST: 44 U/L — ABNORMAL HIGH (ref 15–41)
Albumin: 2.6 g/dL — ABNORMAL LOW (ref 3.5–5.0)
Alkaline Phosphatase: 167 U/L — ABNORMAL HIGH (ref 38–126)
Anion gap: 10 (ref 5–15)
BUN: 52 mg/dL — ABNORMAL HIGH (ref 8–23)
CO2: 29 mmol/L (ref 22–32)
Calcium: 8.9 mg/dL (ref 8.9–10.3)
Chloride: 99 mmol/L (ref 98–111)
Creatinine, Ser: 1.55 mg/dL — ABNORMAL HIGH (ref 0.44–1.00)
GFR, Estimated: 37 mL/min — ABNORMAL LOW (ref >60)
Glucose, Bld: 165 mg/dL — ABNORMAL HIGH (ref 70–99)
Potassium: 4.4 mmol/L (ref 3.5–5.1)
Sodium: 138 mmol/L (ref 135–145)
Total Bilirubin: 0.8 mg/dL (ref 0.0–1.2)
Total Protein: 5.8 g/dL — ABNORMAL LOW (ref 6.5–8.1)

## 2024-01-12 LAB — GLUCOSE, CAPILLARY
Glucose-Capillary: 150 mg/dL — ABNORMAL HIGH (ref 70–99)
Glucose-Capillary: 253 mg/dL — ABNORMAL HIGH (ref 70–99)
Glucose-Capillary: 234 mg/dL — ABNORMAL HIGH (ref 70–99)
Glucose-Capillary: 241 mg/dL — ABNORMAL HIGH (ref 70–99)

## 2024-01-12 MED ORDER — SODIUM CHLORIDE 0.9 % IV SOLN: INTRAVENOUS | Status: AC | PRN

## 2024-01-12 NOTE — Plan of Care (Signed)
  Problem: RH Balance Goal: LTG: Patient will maintain dynamic sitting balance (OT) Description: LTG:  Patient will maintain dynamic sitting balance with assistance during activities of daily living (OT) Flowsheets (Taken 01/12/2024 1300) LTG: Pt will maintain dynamic sitting balance during ADLs with: Supervision/Verbal cueing   Problem: Sit to Stand Goal: LTG:  Patient will perform sit to stand in prep for activites of daily living with assistance level (OT) Description: LTG:  Patient will perform sit to stand in prep for activites of daily living with assistance level (OT) Flowsheets (Taken 01/12/2024 1300) LTG: PT will perform sit to stand in prep for activites of daily living with assistance level: Minimal Assistance - Patient > 75%   Problem: RH Bathing Goal: LTG Patient will bathe all body parts with assist levels (OT) Description: LTG: Patient will bathe all body parts with assist levels (OT) Flowsheets (Taken 01/12/2024 1300) LTG: Pt will perform bathing with assistance level/cueing: Minimal Assistance - Patient > 75%   Problem: RH Dressing Goal: LTG Patient will perform upper body dressing (OT) Description: LTG Patient will perform upper body dressing with assist, with/without cues (OT). Flowsheets (Taken 01/12/2024 1300) LTG: Pt will perform upper body dressing with assistance level of: Supervision/Verbal cueing Goal: LTG Patient will perform lower body dressing w/assist (OT) Description: LTG: Patient will perform lower body dressing with assist, with/without cues in positioning using equipment (OT) Flowsheets (Taken 01/12/2024 1300) LTG: Pt will perform lower body dressing with assistance level of: Minimal Assistance - Patient > 75%   Problem: RH Toileting Goal: LTG Patient will perform toileting task (3/3 steps) with assistance level (OT) Description: LTG: Patient will perform toileting task (3/3 steps) with assistance level (OT)  Flowsheets (Taken 01/12/2024 1300) LTG: Pt will  perform toileting task (3/3 steps) with assistance level: Minimal Assistance - Patient > 75%   Problem: RH Toilet Transfers Goal: LTG Patient will perform toilet transfers w/assist (OT) Description: LTG: Patient will perform toilet transfers with assist, with/without cues using equipment (OT) Flowsheets (Taken 01/12/2024 1300) LTG: Pt will perform toilet transfers with assistance level of: Minimal Assistance - Patient > 75%   Problem: RH Tub/Shower Transfers Goal: LTG Patient will perform tub/shower transfers w/assist (OT) Description: LTG: Patient will perform tub/shower transfers with assist, with/without cues using equipment (OT) Flowsheets (Taken 01/12/2024 1300) LTG: Pt will perform tub/shower stall transfers with assistance level of: Minimal Assistance - Patient > 75%   Problem: RH Memory Goal: LTG Patient will demonstrate ability for day to day recall/carry over during activities of daily living with assistance level (OT) Description: LTG:  Patient will demonstrate ability for day to day recall/carry over during activities of daily living with assistance level (OT). Flowsheets (Taken 01/12/2024 1300) LTG:  Patient will demonstrate ability for day to day recall/carry over during activities of daily living with assistance level (OT): Modified Independent

## 2024-01-12 NOTE — Progress Notes (Signed)
 Peripherally Inserted Central Catheter Placement  The IV Nurse has discussed with the patient and/or persons authorized to consent for the patient, the purpose of this procedure and the potential benefits and risks involved with this procedure.  The benefits include less needle sticks, lab draws from the catheter, and the patient may be discharged home with the catheter. Risks include, but not limited to, infection, bleeding, blood clot (thrombus formation), and puncture of an artery; nerve damage and irregular heartbeat and possibility to perform a PICC exchange if needed/ordered by physician.  Alternatives to this procedure were also discussed.  Bard Power PICC patient education guide, fact sheet on infection prevention and patient information card has been provided to patient /or left at bedside.    PICC Placement Documentation  PICC Single Lumen 01/12/24 Right Basilic 36 cm 0 cm (Active)  Indication for Insertion or Continuance of Line Prolonged intravenous therapies 01/12/24 1357  Exposed Catheter (cm) 0 cm 01/12/24 1357  Site Assessment Clean, Dry, Intact 01/12/24 1357  Line Status Flushed;Saline locked;Blood return noted 01/12/24 1357  Dressing Type Transparent;Securing device 01/12/24 1357  Dressing Status Antimicrobial disc/dressing in place 01/12/24 1357  Line Care Connections checked and tightened 01/12/24 1357  Line Adjustment (NICU/IV Team Only) No 01/12/24 1357  Dressing Intervention New dressing;Adhesive placed at insertion site (IV team only) 01/12/24 1357  Dressing Change Due 01/19/24 01/12/24 1357       Katelyn [name]  Mele Sylvester 01/12/2024, 1:58 PM

## 2024-01-12 NOTE — Progress Notes (Signed)
 Patient ID: Katelyn [name], female   DOB: [birth year], 68 y.o.   MRN: [id number] Has more pain today, feels "loopy" form pain meds, LE strength exam stable, hemovac removed, neg pressure external wound vac in place

## 2024-01-12 NOTE — Progress Notes (Signed)
 PROGRESS NOTE   Subjective/Complaints:  Pt doing well, slept well, pain well controlled, LBM last night finally! Has foley in but wants to know if she can get it out. Denies any other complaints or concerns today.   ROS: as per HPI. Denies CP, SOB, abd pain, N/V/D/C, or any other complaints at this time.    Objective:   [place] EKG SITE RITE Result Date: 01/11/2024 If Site Rite image not attached, placement could not be confirmed due to current cardiac rhythm.  Recent Labs    01/09/24 1656 01/12/24 0643  WBC  --  7.7  HGB 8.8* 10.5*  HCT 26.0* 32.1*  PLT  --  107*   Recent Labs    01/11/24 2038 01/12/24 0643  NA 134* 138  K 4.3 4.4  CL 97* 99  CO2 26 29  GLUCOSE 231* 165*  BUN 57* 52*  CREATININE 1.86* 1.55*  CALCIUM 9.0 8.9      Intake/Output Summary (Last 24 hours) at 01/12/2024 1328 Last data filed at 01/12/2024 1147 Gross per 24 hour  Intake 240 ml  Output 3050 ml  Net -2810 ml        Physical Exam: Vital Signs Blood pressure (!) 122/56, pulse 100, temperature 98 F (36.7 C), resp. rate 18, height 5\' 1"  (1.549 m), weight 90.8 kg, SpO2 98%.  Constitutional: No apparent distress. Appropriate appearance for age. +Obese. Resting comfortably in bed.  HENT: No JVD. Neck Supple. Trachea midline.   Eyes: PERRLA. EOMI. Cardiovascular: RRR, no murmurs/rub/gallops. 1-2+ bilateral peripheral Edema. Peripheral pulses 2+  Respiratory: CTAB. No rales, rhonchi, or wheezing. On RA.  Abdomen: + bowel sounds, normoactive. No distention or tenderness.  GU:   +Foley with yellow urine Skin: Peripheral IVs intact. Wound vac well adhered to back incision, draining minimal gross blood--not reassessed today Right facial and lip edema   herpetic blisters upper lip vermilion border. Reports stable since surgery-- improved 4/5    PRIOR EXAMS: MSK:      No apparent deformity. + RUE shoulder abduction/flexion limited to <50  degrees       Neurologic exam:  Cognition: AAO to person, place, time and event.  Language: Fluent, No substitutions or neoglisms. No dysarthria. Names 3/3 objects correctly.  Memory: Recalls 3/3 objects at 5 minutes. No apparent deficits  Insight: Good insight into current condition.  Mood: Pleasant affect, appropriate mood. Slightly tearful.  Sensation: To light touch reduced in distal fingertips and toes bilaterally; reduced on bilateral medial knees and R lateral knee.    Reflexes: 2+ in BL UE; hyporeflexic BL LE. No babinski. CN: 2-12 grossly intact.  Coordination: No apparent tremors. No ataxia on FTN bilaterally.  Spasticity: MAS 0 in all extremities.       Strength:                RUE: 4-/5 SA, 5/5 EF, 5/5 EE, 5/5 WE, 5/5 FF, 5/5 FA                LUE:  5/5 SA, 5/5 EF, 5/5 EE, 5/5 WE, 5/5 FF, 5/5 FA                RLE:  2+/5 HF, 4/5 KE, 0/5  DF, 1/5  EHL, 3/5  PF                 LLE:  4-/5 HF, 4-/5 KE, 5-/5  DF, 5-/5  EHL, 5-/5  PF   Assessment/Plan: 1. Functional deficits which require 3+ hours per day of interdisciplinary therapy in a comprehensive inpatient rehab setting. Physiatrist is providing close team supervision and 24 hour management of active medical problems listed below. Physiatrist and rehab team continue to assess barriers to discharge/monitor patient progress toward functional and medical goals  Care Tool:  Bathing    Body parts bathed by patient: Right arm, Chest, Abdomen, Right upper leg, Left upper leg, Face   Body parts bathed by helper: Left arm, Front perineal area, Buttocks, Right lower leg, Left lower leg     Bathing assist Assist Level: Moderate Assistance - Patient 50 - 74%     Upper Body Dressing/Undressing Upper body dressing   What is the patient wearing?: Button up shirt    Upper body assist Assist Level: Moderate Assistance - Patient 50 - 74%    Lower Body Dressing/Undressing Lower body dressing      What is the patient wearing?:  Pants     Lower body assist Assist for lower body dressing: Total Assistance - Patient < 25%     Toileting Toileting    Toileting assist Assist for toileting: Total Assistance - Patient < 25%     Transfers Chair/bed transfer  Transfers assist     Chair/bed transfer assist level: Dependent - Patient 0% Antony Salmon)     Locomotion Ambulation   Ambulation assist   Ambulation activity did not occur: Safety/medical concerns (weakness, pain, decreased balance)          Walk 10 feet activity   Assist  Walk 10 feet activity did not occur: Safety/medical concerns (weakness, pain, decreased balance)        Walk 50 feet activity   Assist Walk 50 feet with 2 turns activity did not occur: Safety/medical concerns (weakness, pain, decreased balance)         Walk 150 feet activity   Assist Walk 150 feet activity did not occur: Safety/medical concerns (weakness, pain, decreased balance)         Walk 10 feet on uneven surface  activity   Assist Walk 10 feet on uneven surfaces activity did not occur: Safety/medical concerns (weakness, pain, decreased balance)         Wheelchair     Assist Is the patient using a wheelchair?: Yes Type of Wheelchair: Manual    Wheelchair assist level: Dependent - Patient 0% Max wheelchair distance: >119ft    Wheelchair 50 feet with 2 turns activity    Assist        Assist Level: Dependent - Patient 0%   Wheelchair 150 feet activity     Assist      Assist Level: Dependent - Patient 0%   Blood pressure (!) 122/56, pulse 100, temperature 98 F (36.7 C), resp. rate 18, height 5\' 1"  (1.549 m), weight 90.8 kg, SpO2 98%.  Medical Problem List and Plan: 1. 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  Antithrombotics: -DVT/anticoagulation:  Mechanical: Sequential compression devices, below knee Bilateral lower extremities             -antiplatelet therapy: N/A 3. Acute on chronic pain/Pain Management:  Was on Oxycodone 5 mg and Lyrica 75 mg daily PTA.  --Having surgical pain--continue Oxycodone prn. Pain worse at nights-->will schedule oxycodone at bedtime.  -Voltaren gel QID 4. Mood/Behavior/Sleep: LCSW to folow for evaluation and support.              -antipsychotic agents: N/A  -Cymbalta 60mg  nightly, mirapex 1mg  at bedtime, mirtazapine 30mg  nightly 5. Neuropsych/cognition: This patient is capable of making decisions on his own behalf. 6. Skin/Wound Care: Routine pressure relief measures. Monitor incision for healing.  7. Fluids/Electrolytes/Nutrition: Monitor I/O. Routine labs  -01/12/24 CMP stable today, Cr down to 1.55, AST 44, improving, monitor 8. Neuropathy/band like pain around waist: Will increase Lyrica to TID 9. Osteomyelitis/discitis. Pending PICC placement. -- Starting 6 weeks of daptomycin and ertapenem for 6 weeks through 02/20/24 to be followed by PO suppression doxycycline and ciprofloxacin.  -01/12/24 Cx NGTD x3d 10. HTN:  Monitor BP TID--on torsemide 40mg  daily and coreg 6.125mg  BID (not reordered?).  -01/12/24 BPs fine, coreg not ordered as of now; remain off for now Vitals:   01/11/24 1850 01/11/24 2258 01/12/24 0423  BP: (!) 118/50 (!) 118/50 (!) 122/56    11. T2DM: Hgb A1C-7.9. Used [name] 50-120/ 5 units ac meal TID at home.              --Continue insulin glargine 20 units daily with SSI.   -01/12/24 CBGs a bit better today, monitor trend CBG (last 3)  Recent Labs    01/11/24 2140 01/12/24 0642 01/12/24 1152  GLUCAP 217* 150* 253*    12.  Acute on chronic renal failure: SCr has had rise to 1.56-->will d/c celebrex.  -01/12/24 Cr 1.55 today, down from 1.86 yesterday, monitor Monday 13. Fever blisters: Will start valtrex 1g BID  x1wk as symptomatic. Improving, EOT 4/11 14. Right shoulder pain: Question strain but heard a pop this am and extremely concerned that she may have torn her RTC again --Voltaren gel for local measures.  -- Consider MRI if no improvement   15. Constipation: Continue Senna 2 tabs daily--miralax was added on 04/04 -01/12/24 LBM yesterday, didn't get the 32oz miralax so d/c'd. Continue miralax 17g daily for now.  16. Depression/Anxiety: Continue Cymbalta 60mg  nightly and Mirtazapine 30mg  nightly.   17. Neurogenic bladder: Continue foley. D/c in am if has results with laxative tonight. -01/12/24 had BM last night, will pull foley, do timed toileting and PVRs q4-6h, ISC if PVR >35ml 18. ABLA: Hgb 10.5 01/12/24, monitor  LOS: 1 days A FACE TO FACE EVALUATION WAS PERFORMED  [address] 01/12/2024, 1:28 PM

## 2024-01-12 NOTE — Progress Notes (Signed)
 Physical Therapy Session Note  Patient Details  Name: Katelyn [name] MRN: [id number] Date of Birth: [birth year]  Today's Date: 01/12/2024 PT Individual Time: 1401-1434 PT Individual Time Calculation (min): 33 min   Short Term Goals: Week 1:  PT Short Term Goal 1 (Week 1): pt will perform bed mobility with mod A consistantly PT Short Term Goal 2 (Week 1): pt will transfer sit<>stand with LRAD and mod A consistantly PT Short Term Goal 3 (Week 1): pt will ambulate 72ft with LRAD and max A of 1  Skilled Therapeutic Interventions/Progress Updates:   Rearranged schedule due to PICC placement and RN requesting bed level therapy during recovery period. Received pt semi-reclined in bed with RN removing foley and husband at bedside. Pt agreeable to PT treatment and reported pain 5/10 in low back and reported 7/10 fatigue from therapy and from events from today. Pt agreeable to bed level exercises and performed the following exercises with emphasis on LE strength/ROM: -ankle pumps x20 bilaterally -ankle circles x20 clockwise and x20 counterclockwise bilaterally (decreased ROM on RLE) -hip abduction x10 bilaterally (AAROM on RLE) -SLR x10 bilaterally (AAROM) -SAQ 2x10 bilaterally -hip adduction pillow squeezes 2x10 with 5 second isomeric hold Concluded session with pt semi-reclined in bed, needs within reach, and bed alarm on. Husband present at bedside.  Therapy Documentation Precautions:  Precautions Precautions: Fall Precaution/Restrictions Comments: spinal precautions Required Braces or Orthoses: Spinal Brace Spinal Brace: Thoracolumbosacral orthotic, Applied in sitting position Restrictions Weight Bearing Restrictions Per Provider Order: No  Therapy/Group: Individual Therapy Marlana Salvage Zaunegger Blima Rich PT, DPT 01/12/2024, 7:17 AM

## 2024-01-12 NOTE — Evaluation (Signed)
 Physical Therapy Assessment and Plan  Patient Details  Name: Katelyn [name] MRN: [id number] Date of Birth: 1956/06/26  PT Diagnosis: Abnormal posture, Abnormality of gait, Coordination disorder, Difficulty walking, Edema, Muscle spasms, Muscle weakness, and Pain in low back and R shoulder Rehab Potential: Fair ELOS: 2-3 weeks   Today's Date: 01/12/2024 PT Individual Time: 7829-5621 PT Individual Time Calculation (min): 99 min    Hospital Problem: Principal Problem:   Thoracic myelopathy   Past Medical History:  Past Medical History:  Diagnosis Date   Anxiety    Arthritis    Bursitis of right hip    CAD (coronary artery disease)    Cardiac catheterization June 2014 in [place] - 50% circumflex stenosis   Chest pain, neg MI, stable CAD non obstructive on cath 10/05/20 10/04/2020   Chronic diastolic heart failure (HCC) 08/20/2017   Chronic kidney disease, stage 3 (HCC)    does not see nephrologist   Chronic low back pain without sciatica 03/14/2016   Cirrhosis of liver (HCC)    CKD (chronic kidney disease), stage III (HCC) 10/07/2020   Complication of anesthesia    Cough 04/19/2017   Overview:  Last Assessment & Plan:  Formatting of this note may be different from the original. Cough - ? ACE related with AR triggers   Plan  Patient Instructions  Discuss with your primary doctor that lisinopril pain, need making your cough worse. May use Mucinex DM twice daily as needed for cough and congestion Zyrtec 10 mg at bedtime as needed for drainage Saline nasal spray as needed. Lab tests today Activity as tolerated. Follow with Dr. Craige Cotta in 3-4 months and As needed   Please contact office for sooner follow up if symptoms do not improve or worsen or seek emergency care    Depression    Dyspnea    with exertion   " lazy lung" - per  Dr Craige Cotta from back issues- 06/2016   Elevated liver enzymes 12/05/2016   Essential hypertension    GERD (gastroesophageal reflux disease)    Gout 03/14/2016    Greater trochanteric bursitis of right hip 02/02/2012   H/O hiatal hernia    Heart murmur    History of blood transfusion 2016   History of esophageal stricture 10/07/2020   History of kidney stones    Hypercholesterolemia    Hypertensive heart disease with heart failure (HCC) 01/01/2017   Hypoxia 10/07/2020   Iliotibial band syndrome of right side 02/02/2012   Iron deficiency anemia due to chronic blood loss 03/14/2016   LBBB (left bundle branch block) 01/01/2017   Left bundle branch block    Leg weakness 10/07/2020   Lumbar stenosis    Meralgia paraesthetica 12/05/2016   Mild CAD 11/24/2015   Morbid obesity (HCC) 10/07/2020   Neuropathy    OSA (obstructive sleep apnea) 05/24/2016   Overview:  Managed PULM- no CPAP   PONV (postoperative nausea and vomiting)    "no N/V with patch"   Restless leg syndrome    S/P lumbar laminectomy 11/26/2015   S/P lumbar spinal fusion 08/29/2016   Tinnitus 12/05/2016   Type 2 diabetes mellitus (HCC)    UTI (urinary tract infection) 10/07/2020   Past Surgical History:  Past Surgical History:  Procedure Laterality Date   ABDOMINAL HYSTERECTOMY  1983   APPENDECTOMY  Age 54   BACK SURGERY     FIRST LUMBAR FUSION/ SURGERY APRIL 2012 AND FUSION WITH INSTRUMENTATION SEPT 2012   CARDIAC CATHETERIZATION  x 2   CARPAL TUNNEL RELEASE     bil   CHOLECYSTECTOMY  1990's   COLONOSCOPY  12/12/2017   Colonic polyp status post polypectomy. Mild sigmoid diverticulosis. Otherwise normal colonoscopy to terminal ileum.   CYSTO EXTRACTION KIDNEY STONES     ESOPHAGOGASTRODUODENOSCOPY  12/12/2017   Small hiatal hernia. Mild gastritis. Status post esophageal dilatation.   EXCISION/RELEASE BURSA HIP  02/02/2012   Procedure: EXCISION/RELEASE BURSA HIP;  Surgeon: Jacki Cones, MD;  Location: [facility] ORS;  Service: Orthopedics;  Laterality: Right;  Right Hip Bursectomy   EYE SURGERY Bilateral    cataracts   IR THORACIC DISC ASPIRATION W/IMG GUIDE  02/14/2023    KIDNEY STONE SURGERY  2008   LAMINECTOMY WITH POSTERIOR LATERAL ARTHRODESIS LEVEL 3 N/A 01/09/2024   Procedure: Posterior lateral fusion - Thoracic Seven-Thoracic Eight - Thoracic Eight-Thoracic Nine - Thoracic Nine-Thoracic Ten, Thoracic Ten-Eleven, Thoracic Eleven-Twelve, Thoracic Twelve -Lumbar One, extension of fusion and removal of Thoracic Ten screws;  Surgeon: Arman Bogus, MD;  Location: [facility] OR;  Service: Neurosurgery;  Laterality: N/A;  Posterior lateral fusion - Thoracic Seven-Thora   LAMINECTOMY WITH POSTERIOR LATERAL ARTHRODESIS LEVEL 4 N/A 02/19/2023   Procedure: THORACIC TEN-LUMBAR TWO INSTRUMENTED FUSION;  Surgeon: Tia Alert, MD;  Location: [facility] OR;  Service: Neurosurgery;  Laterality: N/A;   Left knee surgery x 2  1996   reconstruction   LUMBAR DISC SURGERY  08/2016   LUMBAR LAMINECTOMY/DECOMPRESSION MICRODISCECTOMY Right 11/26/2015   Procedure: Extraforaminal Microdiscectomy  - Lumbar two-three- right;  Surgeon: Tia Alert, MD;  Location: [facility];  Service: Neurosurgery;  Laterality: Right;  right    LUMBAR WOUND DEBRIDEMENT N/A 10/25/2016   Procedure: Lumbar wound revision;  Surgeon: Tia Alert, MD;  Location: [facility] OR;  Service: Neurosurgery;  Laterality: N/A;  Lumbar wound revision   Right shoulder surgery  2010   spur   RIGHT/LEFT HEART CATH AND CORONARY ANGIOGRAPHY N/A 10/05/2020   Procedure: RIGHT/LEFT HEART CATH AND CORONARY ANGIOGRAPHY;  Surgeon: Swaz[place], Peter M, MD;  Location: [facility] INVASIVE CV LAB;  Service: Cardiovascular;  Laterality: N/A;   SPINAL CORD STIMULATOR REMOVAL  02/19/2023   Procedure: LUMBAR SPINAL CORD STIMULATOR REMOVAL;  Surgeon: Tia Alert, MD;  Location: [facility] OR;  Service: Neurosurgery;;    Assessment & Plan Clinical Impression: Patient is a 68 y.o. year old female with history of CAD, chronic diastolic CHF, HTN, GERD, CKD, anxiety d/o, esophageal stricture, multiple back surgeries with post op serratia infection in 2017, osteo/diskitis   w/spinal cord stimulator in place 11/2022 treated with antibiotics but follow up imaging showing progressive discitis/oste s/p  medial facetectomy T11-T12 and posterior fixation T12-L2 and removal of [place] stimulator 02/2023 followed by course of antibiotics with transition to po doxy/Cipro. She was admitted on 01/07/24 with progressive LE weakness with inability to walk as well as burning pain in back. She was found to have urinary retention and foley placed.  Myelogram w/CT lumbar spine 01/07/24 showing no change in T9-T10 discitis/osteo, no focal fluid collection but mass effect on thecal sac. She was taken to OR for decompressive thoracic lam with foraminotomies T9-T10, exploration of T10-L2 fusion with removal of loosened T10 pedicle screw and hardware with posterior fixation T7-L1 by Dr. Yetta Barre on 01/09/24.    Dr. Elinor Parkinson consulted for input and recommended d/c of cipro/doxy and daptomycin and ertapenum pending wound cultures. PICC to be placed today for prolonged antibiotics. Therapy has been working with patient who requires max to total  assist with ADLs and mobiltiy. She was independent with use of stand up walker prior to a week ago. CIR Recommended due to functional decline.   Patient currently requires max/total A with mobility secondary to muscle weakness, decreased cardiorespiratoy endurance, impaired timing and sequencing, abnormal tone, unbalanced muscle activation, decreased coordination, and decreased motor planning, and decreased standing balance, decreased postural control, decreased balance strategies, and difficulty maintaining precautions.  Prior to hospitalization, patient was modified independent  with mobility and lived with Spouse in a House home.  Home access is  Ramped entrance.  Patient will benefit from skilled PT intervention to maximize safe functional mobility, minimize fall risk, and decrease caregiver burden for planned discharge home with 24 hour assist.  Anticipate patient  will benefit from follow up [facility] at discharge.  PT - End of Session Activity Tolerance: Tolerates 30+ min activity with multiple rests Endurance Deficit: Yes Endurance Deficit Description: required constant rest breaks throughout session PT Assessment Rehab Potential (ACUTE/IP ONLY): Fair PT Barriers to Discharge: Wound Care;Other (comments) PT Barriers to Discharge Comments: pain, spinal precautions, weakness/deconditioning, decreased balance/coordination, impaired sequencng with RLE PT Patient demonstrates impairments in the following area(s): Balance;Edema;Endurance;Motor;Nutrition;Pain;Safety;Skin Integrity PT Transfers Functional Problem(s): Bed Mobility;Bed to Chair;Car;Furniture PT Locomotion Functional Problem(s): Ambulation;Wheelchair Mobility;Stairs PT Plan PT Intensity: Minimum of 1-2 x/day ,45 to 90 minutes PT Frequency: 5 out of 7 days PT Duration Estimated Length of Stay: 2-3 weeks PT Treatment/Interventions: Ambulation/gait training;Discharge planning;Psychosocial support;Functional mobility training;Therapeutic Activities;Balance/vestibular training;Disease management/prevention;Neuromuscular re-education;Skin care/wound management;Therapeutic Exercise;Wheelchair propulsion/positioning;Cognitive remediation/compensation;DME/adaptive equipment instruction;Pain management;Splinting/orthotics;UE/LE Strength taining/ROM;Community reintegration;Functional electrical stimulation;Patient/family education;Stair training;UE/LE Coordination activities PT Transfers Anticipated Outcome(s): min A with LRAD PT Locomotion Anticipated Outcome(s): min A with LRAD PT Recommendation Recommendations for Other Services: Therapeutic Recreation consult Therapeutic Recreation Interventions: Pet therapy;Stress management Follow Up Recommendations: Home health PT Patient destination: Home Equipment Recommended: To be determined Equipment Details: has RW, rollator, WC, and PFRW   PT  Evaluation Precautions/Restrictions Precautions Precautions: Fall;Back Recall of Precautions/Restrictions: Intact (Pt able to recall 3/3 spinal precautions.) Precaution/Restrictions Comments: Verbally reviewed spinal precautions, re-educated on acronym BLT. Required Braces or Orthoses: Spinal Brace Spinal Brace: Thoracolumbosacral orthotic;Applied in sitting position Restrictions Weight Bearing Restrictions Per Provider Order: No Pain Interference Pain Interference Pain Effect on Sleep: 1. Rarely or not at all Pain Interference with Therapy Activities: 1. Rarely or not at all Pain Interference with Day-to-Day Activities: 1. Rarely or not at all Home Living/Prior Functioning Home Living Available Help at Discharge: Family;Available 24 hours/day Type of Home: House Home Access: Ramped entrance Home Layout: One level Bathroom Shower/Tub: IT sales professional, grab bars, detachable shower head.) Bathroom Toilet: Handicapped height Bathroom Accessibility: Yes Additional Comments: has WC, PFRW, rollator, and RW. Uses rollator in house and PFRW when out in the community. WC was her father's and ~14 years old  Lives With: Spouse Prior Function Level of Independence: Requires assistive device for independence (pt reports being mod I with rollator/PFRW up until about 2 weeks ago.) Bath: Maximal Toileting: Maximal Dressing: Maximal  Able to Take Stairs?: No Driving: Yes (hasn't driven in a few weeks due to pain medication) Vocation: Retired Vision/Perception  Vision - History Ability to See in Adequate Light: 0 Adequate Perception Perception: Within Functional Limits  Cognition Overall Cognitive Status: Within Functional Limits for tasks assessed Arousal/Alertness: Lethargic Orientation Level: Oriented X4 Memory: Appears intact Awareness: Impaired Problem Solving: Impaired Behaviors: Other (comment) (hyperverbal initially, but easily redirected) Safety/Judgment:  Impaired Sensation Sensation Light Touch: Appears Intact Hot/Cold: Not tested Proprioception: Appears Intact  Stereognosis: Not tested Additional Comments: impaired motor planning and sequencing Coordination Gross Motor Movements are Fluid and Coordinated: No Fine Motor Movements are Fluid and Coordinated: Yes Coordination and Movement Description: grossly uncoordinated due to pain, fatigue, decreased standing balance/coordination, and impaired motor planning and sequencing Finger Nose Finger Test: WFL bilaterally but slow Heel Shin Test: unable to perform bilaterally Motor  Motor Motor: Other (comment) Motor - Skilled Clinical Observations: impaired motor planning and sequencing (specifically with RLE)  Trunk/Postural Assessment  Cervical Assessment Cervical Assessment: Within Functional Limits Thoracic Assessment Thoracic Assessment: Exceptions to [facility] (limited ROM due to TLSO) Lumbar Assessment Lumbar Assessment: Exceptions to [facility] (limited ROM due to TLSO) Postural Control Postural Control: Deficits on evaluation Trunk Control: posterior bias in standing Righting Reactions: delayed and inadequate Protective Responses: delayed and inadequate  Balance Balance Balance Assessed: Yes Static Sitting Balance Static Sitting - Balance Support: Feet supported;Bilateral upper extremity supported Static Sitting - Level of Assistance: 5: Stand by assistance (supervision) Dynamic Sitting Balance Dynamic Sitting - Balance Support: Feet supported;No upper extremity supported Dynamic Sitting - Level of Assistance: 5: Stand by assistance (CGA) Static Standing Balance Static Standing - Balance Support: Bilateral upper extremity supported;During functional activity (RW) Static Standing - Level of Assistance: 2: Max assist Dynamic Standing Balance Dynamic Standing - Balance Support: Bilateral upper extremity supported;During functional activity (RW) Dynamic Standing - Level of Assistance: 2:  Max assist Dynamic Standing - Comments: with sit<>stand transfers Extremity Assessment RLE Assessment RLE Assessment: Exceptions to [facility] General Strength Comments: tested sitting in WC RLE Strength Right Hip Flexion: 2+/5 Right Hip ABduction: 3/5 Right Hip ADduction: 3-/5 Right Knee Extension: 3-/5 Right Ankle Dorsiflexion: 3-/5 Right Ankle Plantar Flexion: 3-/5 LLE Assessment LLE Assessment: Exceptions to [facility] General Strength Comments: tested sitting in WC LLE Strength Left Hip Flexion: 2+/5 Left Hip ABduction: 3-/5 Left Hip ADduction: 3/5 Left Knee Extension: 3-/5 Left Ankle Dorsiflexion: 3-/5 Left Ankle Plantar Flexion: 3-/5  Care Tool Care Tool Bed Mobility Roll left and right activity        Sit to lying activity   Sit to lying assist level: Maximal Assistance - Patient 25 - 49%    Lying to sitting on side of bed activity         Care Tool Transfers Sit to stand transfer   Sit to stand assist level: Maximal Assistance - Patient 25 - 49%    Chair/bed transfer   Chair/bed transfer assist level: Dependent - Patient 0% (Stedy)    Licensed conveyancer transfer activity did not occur: Safety/medical concerns (weakness, pain, decreased balance)        Care Tool Locomotion Ambulation Ambulation activity did not occur: Safety/medical concerns (weakness, pain, decreased balance)        Walk 10 feet activity Walk 10 feet activity did not occur: Safety/medical concerns (weakness, pain, decreased balance)       Walk 50 feet with 2 turns activity Walk 50 feet with 2 turns activity did not occur: Safety/medical concerns (weakness, pain, decreased balance)      Walk 150 feet activity Walk 150 feet activity did not occur: Safety/medical concerns (weakness, pain, decreased balance)      Walk 10 feet on uneven surfaces activity Walk 10 feet on uneven surfaces activity did not occur: Safety/medical concerns (weakness, pain, decreased balance)      Stairs Stair activity  did not occur: Safety/medical concerns (weakness, pain, decreased balance)        Walk up/down 1 step activity Walk up/down 1  step or curb (drop down) activity did not occur: Safety/medical concerns (weakness, pain, decreased balance)      Walk up/down 4 steps activity Walk up/down 4 steps activity did not occur: Safety/medical concerns (weakness, pain, decreased balance)      Walk up/down 12 steps activity Walk up/down 12 steps activity did not occur: Safety/medical concerns (weakness, pain, decreased balance)      Pick up small objects from floor Pick up small object from the floor (from standing position) activity did not occur: Safety/medical concerns (weakness, pain, decreased balance)      Wheelchair Is the patient using a wheelchair?: Yes Type of Wheelchair: Manual   Wheelchair assist level: Dependent - Patient 0% Max wheelchair distance: >132ft  Wheel 50 feet with 2 turns activity   Assist Level: Dependent - Patient 0%  Wheel 150 feet activity   Assist Level: Dependent - Patient 0%    Refer to Care Plan for Long Term Goals  SHORT TERM GOAL WEEK 1 PT Short Term Goal 1 (Week 1): pt will perform bed mobility with mod A consistantly PT Short Term Goal 2 (Week 1): pt will transfer sit<>stand with LRAD and mod A consistantly PT Short Term Goal 3 (Week 1): pt will ambulate 52ft with LRAD and max A of 1  Recommendations for other services: Therapeutic Recreation  Pet therapy and Stress management  Skilled Therapeutic Intervention Evaluation completed (see details above and below) with education on PT POC and goals and individual treatment initiated with focus on functional mobility/transfers, generalized strengthening and endurance, NMR, and dynamic standing balance/coordination. Received pt sitting in [facility] with husband at bedside. Pt educated on PT evaluation, CIR policies, and therapy schedule and agreeable. Pt reported pain 7/10 in low back and anterior chest. Pt slightly  hyperverbal, opening up about her hardships throughout life, especially with her sister who was an alcoholic and drug addict. Pt also very fatigued and lethargic throughout session (most likely due to oxy), getting worse as session continued.   Pt transported to/from room in [facility] dependently for time management and energy conservation purposes. Stood from [facility] with RW and max A x 2 trials and attempted stand<>pivot transfer into simulated car however, pt unable to advance RLE and reporting increased pain in R shoulder from heavy reliance on RW for support. Noted poor eccentric control when sitting with RLE slipping and extending out - husband to bring tennis shoes on Monday.  In main gym, stood x 3 additional trials with RW and max A. Pt required manual faciliation for proper foot placement prior to standing and cues for upright posture/gaze. Pt still unable to advance RLE and place full weight on RLE to advance LLE to walk and with posterior bias in standing - required cues for upright posture/gaze, glute activation, anterior weight shifting, and to avoid "locking knees".   Pt then performed seated BLE strengthening on Kinetron at 30 cm/sec for 1 minute x 4 trials with emphasis on glute/quad strength. Pt required rest breaks in between trials due to fatigue. Returned to room and pt requested to return to bed. Stood in [place] from [facility] with mod A and transferred back to bed dependently. Stood from [employer] flaps with CGA, removed TLSO dependently,  and transitioned into supine with max A for BLE management. Required +2 to scoot to [facility] and concluded session with pt semi-reclined in bed, needs within reach, and bed alarm on. Provided pt with ice packs per request.   Mobility Bed Mobility Bed Mobility: Sit to Supine  Sit to Supine: Maximal Assistance - Patient 25-49% Transfers Transfers: Sit to Stand;Stand to Sit;Stand Pivot Transfers Sit to Stand: Maximal Assistance - Patient 25-49% Stand to Sit: Maximal  Assistance - Patient 25-49% Stand Pivot Transfers: Dependent - mechanical lift (Stedy) Transfer (Assistive device): Rolling walker Locomotion  Gait Ambulation: No Gait Gait: No Stairs / Additional Locomotion Stairs: No Wheelchair Mobility Wheelchair Mobility: Yes Wheelchair Assistance: Dependent - Patient 0% Wheelchair Parts Management: Needs assistance Distance: >121ft   Discharge Criteria: Patient will be discharged from PT if patient refuses treatment 3 consecutive times without medical reason, if treatment goals not met, if there is a change in medical status, if patient makes no progress towards goals or if patient is discharged from hospital.  The above assessment, treatment plan, treatment alternatives and goals were discussed and mutually agreed upon: by patient and by family  Huntley Dec PT, DPT 01/12/2024, 12:15 PM

## 2024-01-12 NOTE — Progress Notes (Signed)
 Inpatient Rehabilitation Admission Medication Review by a Pharmacist  A complete drug regimen review was completed for this patient to identify any potential clinically significant medication issues.  High Risk Drug Classes Is patient taking? Indication by Medication  Antipsychotic Yes, as an intravenous medication Prochlorperazine - nausea  Anticoagulant Yes Enoxaparin - VTE ppx  Antibiotic Yes, as an intravenous medication Daptomycin, Ertapenem - lumbar infection  Opioid Yes Oxycodone - pain  Antiplatelet No   Hypoglycemics/insulin Yes Sliding Scale Insulin, Glargine Insulin - DM  Vasoactive Medication No   Chemotherapy No   Other Yes APAP, Diclofenac Gel - pain Duloxetine - Mood, Pain Pramipexole - Restless Leg Syndrome Torsemide - Diuretic Valacyclovir - Herpes / Fever blisters Bisacodyl, Miralax, Senna, Fleets - bowel regimen Melatonin - sleep Methocarbamol - muscle spasms MVI - supplement     Type of Medication Issue Identified Description of Issue Recommendation(s)  Drug Interaction(s) (clinically significant)     Duplicate Therapy     Allergy     No Medication Administration End Date  IV antibiotics through 02/20/24. OPAT to be completed prior to discharge from rehab.  Incorrect Dose     Additional Drug Therapy Needed     Significant med changes from prior encounter (inform family/care partners about these prior to discharge). Cipro and Doxycycline were stopped in favor of IV antibiotics through 02/20/24, then restart oral antibiotics for suppression per ID recs.  Flexeril was stopped on discharge from acute inpatient admission.  The following medications were to be continued on discharge from acute and have not been reordered for rehab: Coreg 6.25mg  BID Claritin 10mg  daily Mirtazepine 30mg  hs Pantoprazole 40mg  daily Pregabalin 75mg  BID         Continue to monitor and restart as indicated.  Other       Clinically significant medication issues were  identified that warrant physician communication and completion of prescribed/recommended actions by midnight of the next day:  No  Name of provider notified for urgent issues identified:   Provider Method of Notification:     Pharmacist comments:   Time spent performing this drug regimen review (minutes):  30   Kaylenn Civil, Judie Bonus 01/12/2024 2:16 PM

## 2024-01-12 NOTE — Evaluation (Signed)
 Occupational Therapy Assessment and Plan  Patient Details  Name: Katelyn [name] MRN: [id number] Date of Birth: 06-15-1956  OT Diagnosis: abnormal posture, acute pain, muscle weakness (generalized), and decreased activity tolerance Rehab Potential: Rehab Potential (ACUTE ONLY): Fair ELOS: 14-21 days   Today's Date: 01/12/2024 OT Individual Time: 4098-1191 OT Individual Time Calculation (min): 70 min     Hospital Problem: Principal Problem:   Thoracic myelopathy   Past Medical History:  Past Medical History:  Diagnosis Date   Anxiety    Arthritis    Bursitis of right hip    CAD (coronary artery disease)    Cardiac catheterization June 2014 in [place] - 50% circumflex stenosis   Chest pain, neg MI, stable CAD non obstructive on cath 10/05/20 10/04/2020   Chronic diastolic heart failure (HCC) 08/20/2017   Chronic kidney disease, stage 3 (HCC)    does not see nephrologist   Chronic low back pain without sciatica 03/14/2016   Cirrhosis of liver (HCC)    CKD (chronic kidney disease), stage III (HCC) 10/07/2020   Complication of anesthesia    Cough 04/19/2017   Overview:  Last Assessment & Plan:  Formatting of this note may be different from the original. Cough - ? ACE related with AR triggers   Plan  Patient Instructions  Discuss with your primary doctor that lisinopril pain, need making your cough worse. May use Mucinex DM twice daily as needed for cough and congestion Zyrtec 10 mg at bedtime as needed for drainage Saline nasal spray as needed. Lab tests today Activity as tolerated. Follow with Dr. Craige Cotta in 3-4 months and As needed   Please contact office for sooner follow up if symptoms do not improve or worsen or seek emergency care    Depression    Dyspnea    with exertion   " lazy lung" - per  Dr Craige Cotta from back issues- 06/2016   Elevated liver enzymes 12/05/2016   Essential hypertension    GERD (gastroesophageal reflux disease)    Gout 03/14/2016   Greater trochanteric bursitis of  right hip 02/02/2012   H/O hiatal hernia    Heart murmur    History of blood transfusion 2016   History of esophageal stricture 10/07/2020   History of kidney stones    Hypercholesterolemia    Hypertensive heart disease with heart failure (HCC) 01/01/2017   Hypoxia 10/07/2020   Iliotibial band syndrome of right side 02/02/2012   Iron deficiency anemia due to chronic blood loss 03/14/2016   LBBB (left bundle branch block) 01/01/2017   Left bundle branch block    Leg weakness 10/07/2020   Lumbar stenosis    Meralgia paraesthetica 12/05/2016   Mild CAD 11/24/2015   Morbid obesity (HCC) 10/07/2020   Neuropathy    OSA (obstructive sleep apnea) 05/24/2016   Overview:  Managed PULM- no CPAP   PONV (postoperative nausea and vomiting)    "no N/V with patch"   Restless leg syndrome    S/P lumbar laminectomy 11/26/2015   S/P lumbar spinal fusion 08/29/2016   Tinnitus 12/05/2016   Type 2 diabetes mellitus (HCC)    UTI (urinary tract infection) 10/07/2020   Past Surgical History:  Past Surgical History:  Procedure Laterality Date   ABDOMINAL HYSTERECTOMY  1983   APPENDECTOMY  Age 26   BACK SURGERY     FIRST LUMBAR FUSION/ SURGERY APRIL 2012 AND FUSION WITH INSTRUMENTATION SEPT 2012   CARDIAC CATHETERIZATION     x 2   CARPAL TUNNEL  RELEASE     bil   CHOLECYSTECTOMY  1990's   COLONOSCOPY  12/12/2017   Colonic polyp status post polypectomy. Mild sigmoid diverticulosis. Otherwise normal colonoscopy to terminal ileum.   CYSTO EXTRACTION KIDNEY STONES     ESOPHAGOGASTRODUODENOSCOPY  12/12/2017   Small hiatal hernia. Mild gastritis. Status post esophageal dilatation.   EXCISION/RELEASE BURSA HIP  02/02/2012   Procedure: EXCISION/RELEASE BURSA HIP;  Surgeon: Jacki Cones, MD;  Location: [facility];  Service: Orthopedics;  Laterality: Right;  Right Hip Bursectomy   EYE SURGERY Bilateral    cataracts   IR THORACIC DISC ASPIRATION W/IMG GUIDE  02/14/2023   KIDNEY STONE SURGERY  2008    LAMINECTOMY WITH POSTERIOR LATERAL ARTHRODESIS LEVEL 3 N/A 01/09/2024   Procedure: Posterior lateral fusion - Thoracic Seven-Thoracic Eight - Thoracic Eight-Thoracic Nine - Thoracic Nine-Thoracic Ten, Thoracic Ten-Eleven, Thoracic Eleven-Twelve, Thoracic Twelve -Lumbar One, extension of fusion and removal of Thoracic Ten screws;  Surgeon: Arman Bogus, MD;  Location: [facility] OR;  Service: Neurosurgery;  Laterality: N/A;  Posterior lateral fusion - Thoracic Seven-Thora   LAMINECTOMY WITH POSTERIOR LATERAL ARTHRODESIS LEVEL 4 N/A 02/19/2023   Procedure: THORACIC TEN-LUMBAR TWO INSTRUMENTED FUSION;  Surgeon: Tia Alert, MD;  Location: [facility] OR;  Service: Neurosurgery;  Laterality: N/A;   Left knee surgery x 2  1996   reconstruction   LUMBAR DISC SURGERY  08/2016   LUMBAR LAMINECTOMY/DECOMPRESSION MICRODISCECTOMY Right 11/26/2015   Procedure: Extraforaminal Microdiscectomy  - Lumbar two-three- right;  Surgeon: Tia Alert, MD;  Location: [facility];  Service: Neurosurgery;  Laterality: Right;  right    LUMBAR WOUND DEBRIDEMENT N/A 10/25/2016   Procedure: Lumbar wound revision;  Surgeon: Tia Alert, MD;  Location: [facility] OR;  Service: Neurosurgery;  Laterality: N/A;  Lumbar wound revision   Right shoulder surgery  2010   spur   RIGHT/LEFT HEART CATH AND CORONARY ANGIOGRAPHY N/A 10/05/2020   Procedure: RIGHT/LEFT HEART CATH AND CORONARY ANGIOGRAPHY;  Surgeon: [place], Peter M, MD;  Location: [facility] INVASIVE CV LAB;  Service: Cardiovascular;  Laterality: N/A;   SPINAL CORD STIMULATOR REMOVAL  02/19/2023   Procedure: LUMBAR SPINAL CORD STIMULATOR REMOVAL;  Surgeon: Tia Alert, MD;  Location: [facility] OR;  Service: Neurosurgery;;    Assessment & Plan Clinical Impression: Patient is a 68 year old female with history of CAD, chronic diastolic CHF, HTN, GERD, CKD, anxiety d/o, esophageal stricture, multiple back surgeries with post op serratia infection in 2017, 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 She was admitted on 01/07/24 with progressive LE weakness with inability to walk as well as burning pain in back. She was found to have urinary retention and foley placed. Myelogram w/CT lumbar spine 01/07/24 showing no change in T9-T10 discitis/osteo, no focal fluid collection but mass effect on thecal sac. She was taken to OR for decompressive thoracic lam with foraminotomies T9-T10, exploration of T10-L2 fusion with removal of loosened T10 pedicle screw and hardware with posterior fixation T7-L1 by Dr. Yetta Barre on 01/09/24.  Patient transferred to CIR on 01/11/2024 .    Patient currently requires overall max with basic self-care skills secondary to muscle weakness and muscle joint tightness, decreased cardiorespiratoy endurance, impaired timing and sequencing, unbalanced muscle activation, and decreased coordination, decreased sensation to BLE, decreased attention, decreased awareness,  decreased problem solving, and decreased safety awareness, and decreased sitting balance, decreased standing balance, decreased postural control, decreased balance strategies, and difficulty maintaining precautions.  Prior to hospitalization, patient could complete BADLs and functional mobility with up to max A due to severity of BLE weakness.   Patient will benefit from skilled intervention to decrease level of assist with basic self-care skills and increase independence with basic self-care skills prior to discharge home with care partner.  Anticipate patient will require minimal physical assistance and follow up home health.  OT - End of Session Activity Tolerance: Tolerates < 10 min activity, no significant change in vital signs Endurance Deficit: Yes Endurance Deficit  Description: required constant rest breaks throughout session OT Assessment Rehab Potential (ACUTE ONLY): Fair OT Barriers to Discharge: IV antibiotics;Wound Care;Weight OT Patient demonstrates impairments in the following area(s): Balance;Edema;Endurance;Motor;Pain;Perception;Safety;Sensory;Skin Integrity OT Basic ADL's Functional Problem(s): Bathing;Dressing;Toileting OT Transfers Functional Problem(s): Toilet;Tub/Shower OT Plan OT Intensity: Minimum of 1-2 x/day, 45 to 90 minutes OT Frequency: 5 out of 7 days OT Duration/Estimated Length of Stay: 14-21 days OT Treatment/Interventions: Balance/vestibular training;Cognitive remediation/compensation;Community reintegration;Discharge planning;Disease mangement/prevention;DME/adaptive equipment instruction;Neuromuscular re-education;Functional mobility training;Functional electrical stimulation;Pain management;Patient/family education;Psychosocial support;Splinting/orthotics;Skin care/wound managment;Self Care/advanced ADL retraining;Therapeutic Activities;Therapeutic Exercise;UE/LE Strength taining/ROM;Wheelchair propulsion/positioning;Visual/perceptual remediation/compensation;UE/LE Coordination activities OT Basic Self-Care Anticipated Outcome(s): Min A OT Toileting Anticipated Outcome(s): Min A OT Bathroom Transfers Anticipated Outcome(s): Min A OT Recommendation Recommendations for Other Services: Therapeutic Recreation consult Therapeutic Recreation Interventions: Pet therapy;Stress management Patient destination: Home Follow Up Recommendations: Home health OT Equipment Recommended: To be determined   OT Evaluation Precautions/Restrictions  Precautions Precautions: Fall;Back Recall of Precautions/Restrictions: Intact (Pt able to recall 3/3 spinal precautions.) Precaution/Restrictions Comments: Verbally reviewed spinal precautions, re-educated on acronym BLT. Required Braces or Orthoses: Spinal Brace Spinal Brace: Thoracolumbosacral  orthotic;Applied in sitting position Restrictions Weight Bearing Restrictions Per Provider Order: No General Chart Reviewed: Yes Family/Caregiver Present: Yes (Spouse)  Pain Pain Assessment Pain Scale: 0-10 Pain Score: 5  Pain Type: Surgical pain;Acute pain Pain Location: Back Pain Orientation: Mid Pain Descriptors / Indicators: Sore Pain Intervention(s): Medication (See eMAR);Rest Home Living/Prior Functioning Home Living Family/patient expects to be discharged to:: Private residence Living Arrangements: Spouse/significant other Available Help at Discharge: Family, Available 24 hours/day Type of Home: House Home Access: Ramped entrance Home Layout: One level Bathroom Shower/Tub: IT sales professional, grab bars, detachable shower head.) Bathroom Toilet: Handicapped height Bathroom Accessibility: Yes Additional Comments: has WC, PFRW, rollator, and RW. Uses rollator in house and PFRW when out in the community. WC was her father's and ~72 years old  Lives With: Spouse IADL History Homemaking Responsibilities: No Current License: Yes Occupation: Retired Prior Function Level of Independence: Requires assistive device for independence (pt reports being mod I with rollator/PFRW up until about 2 weeks ago.) Bath: Maximal Toileting: Maximal Dressing: Maximal  Able to Take Stairs?: No Driving: Yes (hasn't driven in a few weeks due to pain medication) Vocation: Retired Administrator, sports Baseline Vision/History: 1 Wears glasses Ability to See in Adequate Light: 0 Adequate Patient Visual Report: No change from baseline Vision Assessment?: No apparent visual deficits Perception  Perception: Within Functional Limits Praxis Praxis: WFL Cognition Cognition Overall Cognitive Status: Within Functional Limits for tasks assessed Arousal/Alertness: Lethargic Memory: Appears intact Awareness: Impaired Problem Solving: Impaired Behaviors: Other (comment) (hyperverbal initially, but  easily redirected) Safety/Judgment: Impaired Brief Interview for Mental Status (BIMS) Repetition of Three Words (First Attempt): 3 Temporal Orientation: Year: Correct Temporal Orientation: Month: Accurate within 5 days Temporal Orientation: Day: Correct Recall: "Sock": Yes, no  cue required Recall: "Blue": Yes, no cue required Recall: "Bed": Yes, no cue required BIMS Summary Score: 15 Sensation Sensation Light Touch: Appears Intact Hot/Cold: Not tested Proprioception: Impaired by gross assessment Stereognosis: Not tested Additional Comments: Impaired motor planning and sequencing Coordination Gross Motor Movements are Fluid and Coordinated: No Fine Motor Movements are Fluid and Coordinated: Yes Coordination and Movement Description: Grossly uncoordinated due to pain, fatigue, decreased standing balance/coordination, and impaired motor planning and sequencing. Finger Nose Finger Test: [facility] bilaterally but slow Heel Shin Test: Unable to perform bilaterally Motor  Motor Motor: Other (comment) Motor - Skilled Clinical Observations: Impaired motor planning and sequencing (specifically with RLE)  Trunk/Postural Assessment  Cervical Assessment Cervical Assessment: Within Functional Limits Thoracic Assessment Thoracic Assessment: Exceptions to [facility] (Limited ROM due to TLSO & spinal precuations) Lumbar Assessment Lumbar Assessment: Exceptions to [facility] (Limited ROM due to TLSO & spinal precuations) Postural Control Postural Control: Deficits on evaluation Trunk Control: Posterior bias in standing Righting Reactions: Delayed and inadequate Protective Responses: Delayed and inadequate  Balance Balance Balance Assessed: Yes Static Sitting Balance Static Sitting - Balance Support: Feet supported;Bilateral upper extremity supported Static Sitting - Level of Assistance: 5: Stand by assistance (supervision) Dynamic Sitting Balance Dynamic Sitting - Balance Support: Feet supported;No upper  extremity supported Dynamic Sitting - Level of Assistance: 5: Stand by assistance (CGA) Static Standing Balance Static Standing - Balance Support: Bilateral upper extremity supported;During functional activity (RW) Static Standing - Level of Assistance: 2: Max assist Dynamic Standing Balance Dynamic Standing - Balance Support: Bilateral upper extremity supported;During functional activity (RW) Dynamic Standing - Level of Assistance: 2: Max assist Dynamic Standing - Comments: with sit<>stand transfers Extremity/Trunk Assessment RUE Assessment RUE Assessment: Exceptions to [facility] Active Range of Motion (AROM) Comments: Limited/painful ROM due to previous injury. General Strength Comments: 2-/5 LUE Assessment LUE Assessment: Exceptions to [facility] Active Range of Motion (AROM) Comments: WFL General Strength Comments: 4-/5  Care Tool Care Tool Self Care Eating   Eating Assist Level: Set up assist    Oral Care    Oral Care Assist Level: Set up assist    Bathing   Body parts bathed by patient: Right arm;Chest;Abdomen;Right upper leg;Left upper leg;Face Body parts bathed by helper: Left arm;Front perineal area;Buttocks;Right lower leg;Left lower leg   Assist Level: Moderate Assistance - Patient 50 - 74%    Upper Body Dressing(including orthotics)   What is the patient wearing?: Button up shirt   Assist Level: Moderate Assistance - Patient 50 - 74%    Lower Body Dressing (excluding footwear)   What is the patient wearing?: Pants Assist for lower body dressing: Total Assistance - Patient < 25%    Putting on/Taking off footwear   What is the patient wearing?: Non-skid slipper socks Assist for footwear: Total Assistance - Patient < 25%       Care Tool Toileting Toileting activity   Assist for toileting: Total Assistance - Patient < 25%     Care Tool Bed Mobility Roll left and right activity        Sit to lying activity   Sit to lying assist level: Maximal Assistance - Patient 25  - 49%    Lying to sitting on side of bed activity         Care Tool Transfers Sit to stand transfer   Sit to stand assist level: Maximal Assistance - Patient 25 - 49%    Chair/bed transfer   Chair/bed transfer assist level: Dependent - Patient 0% Antony Salmon)  Toilet transfer   Assist Level: Dependent - Patient 0% (Use of stedy)     Care Tool Cognition  Expression of Ideas and Wants Expression of Ideas and Wants: 3. Some difficulty - exhibits some difficulty with expressing needs and ideas (e.g, some words or finishing thoughts) or speech is not clear  Understanding Verbal and Non-Verbal Content Understanding Verbal and Non-Verbal Content: 3. Usually understands - understands most conversations, but misses some part/intent of message. Requires cues at times to understand   Memory/Recall Ability Memory/Recall Ability : That he or she is in a hospital/hospital unit   Refer to Care Plan for Long Term Goals  SHORT TERM GOAL WEEK 1 OT Short Term Goal 1 (Week 1): Pt will thread BLE into LB garments with Min A + LRAD. OT Short Term Goal 2 (Week 1): Pt will complete 1/3 toileting activities with Min A + LRAD. OT Short Term Goal 3 (Week 1): Pt will perform toilet transfer with consistent Mod A.  Recommendations for other services: None    Skilled Therapeutic Intervention  Session began with introduction to OT role, OT POC, and general orientation to rehab unit/schedule. Pt transitions to EOB with Mod-Max A and cuing for log-roll technique. Pt requires the levels of assistance noted below to manage full-body dressing, stedy transfer from heightened EOB with light Min A--lift utilized for time management due to multiple interruptions by medical team (during which OT retrieves needed equipment). Pt does demo tangential speech and decreased attention, anticipate due to pain medication administered, but will continue to monitor. Pt remained sitting in WC with LPN and spouse present.     ADL ADL Eating: Set up Where Assessed-Eating: Bed level Grooming: Setup;Minimal assistance Where Assessed-Grooming: Wheelchair Upper Body Bathing: Minimal assistance Where Assessed-Upper Body Bathing: Edge of bed Lower Body Bathing: Maximal assistance Where Assessed-Lower Body Bathing: Edge of bed Upper Body Dressing: Moderate assistance Where Assessed-Upper Body Dressing: Edge of bed Lower Body Dressing: Maximal assistance Where Assessed-Lower Body Dressing: Edge of bed Toileting: Maximal assistance Where Assessed-Toileting: Toilet;Bedside Commode Toilet Transfer: Other (comment) (Simulated with use of sara stedy.) Toilet Transfer Method: Other (comment) (Simulated with use of sara stedy.) Tub/Shower Transfer: Unable to assess Film/video editor: Unable to assess Mobility  Bed Mobility Bed Mobility: Sit to Supine Sit to Supine: Maximal Assistance - Patient 25-49% Transfers Sit to Stand: Maximal Assistance - Patient 25-49% Stand to Sit: Maximal Assistance - Patient 25-49%   Discharge Criteria: Patient will be discharged from OT if patient refuses treatment 3 consecutive times without medical reason, if treatment goals not met, if there is a change in medical status, if patient makes no progress towards goals or if patient is discharged from hospital.  The above assessment, treatment plan, treatment alternatives and goals were discussed and mutually agreed upon: by patient and by family  Lou Cal, OTR/L, MSOT  01/12/2024, 12:52 PM

## 2024-01-13 ENCOUNTER — Inpatient Hospital Stay (HOSPITAL_COMMUNITY)

## 2024-01-13 DIAGNOSIS — I10 Essential (primary) hypertension: Secondary | ICD-10-CM | POA: Diagnosis not present

## 2024-01-13 DIAGNOSIS — K5901 Slow transit constipation: Secondary | ICD-10-CM | POA: Diagnosis not present

## 2024-01-13 DIAGNOSIS — R609 Edema, unspecified: Secondary | ICD-10-CM

## 2024-01-13 DIAGNOSIS — R739 Hyperglycemia, unspecified: Secondary | ICD-10-CM | POA: Diagnosis not present

## 2024-01-13 DIAGNOSIS — M4714 Other spondylosis with myelopathy, thoracic region: Secondary | ICD-10-CM | POA: Diagnosis not present

## 2024-01-13 LAB — BPAM RBC
Blood Product Expiration Date: 202504272359
Blood Product Expiration Date: 202504272359
ISSUE DATE / TIME: 202504021642
ISSUE DATE / TIME: 202504021642
Unit Type and Rh: 6200
Unit Type and Rh: 6200

## 2024-01-13 LAB — GLUCOSE, CAPILLARY
Glucose-Capillary: 165 mg/dL — ABNORMAL HIGH (ref 70–99)
Glucose-Capillary: 271 mg/dL — ABNORMAL HIGH (ref 70–99)
Glucose-Capillary: 184 mg/dL — ABNORMAL HIGH (ref 70–99)
Glucose-Capillary: 285 mg/dL — ABNORMAL HIGH (ref 70–99)

## 2024-01-13 LAB — TYPE AND SCREEN
ABO/RH(D): A POS
Antibody Screen: NEGATIVE
Unit division: 0
Unit division: 0

## 2024-01-13 MED ORDER — PANTOPRAZOLE SODIUM 40 MG PO TBEC
40.0000 mg | DELAYED_RELEASE_TABLET | Freq: Every day | ORAL | Status: DC
  Administered 2024-01-13 – 2024-01-17 (×5): 40 mg via ORAL
  Filled 2024-01-13 (×5): qty 1

## 2024-01-13 MED ORDER — CHLORHEXIDINE GLUCONATE CLOTH 2 % EX PADS
6.0000 | MEDICATED_PAD | Freq: Two times a day (BID) | CUTANEOUS | Status: DC
  Administered 2024-01-13 – 2024-02-01 (×39): 6 via TOPICAL

## 2024-01-13 NOTE — Progress Notes (Signed)
 BLE venous duplex has been completed.   Results can be found under chart review under CV PROC. 01/13/2024 10:33 AM Jennise Both RVT, RDMS

## 2024-01-13 NOTE — Progress Notes (Signed)
 The patient states that her PICC site has a burning sensation since it was inserted 01/13/24. I notified MD and requested an IV consult. Patient is resting in bed with call bell in reach.

## 2024-01-13 NOTE — Progress Notes (Signed)
 PROGRESS NOTE   Subjective/Complaints:  Pt doing well again today, slept well, pain well controlled, LBM last night again. Urinating fine! Denies any other complaints or concerns today.   ROS: as per HPI. Denies CP, SOB, abd pain, N/V/D/C, or any other complaints at this time.    Objective:   VAS [place] LOWER EXTREMITY VENOUS (DVT) Result Date: 01/13/2024  Lower Venous DVT Study Patient Name:  AIMY SWEETING Badami  Date of Exam:   01/13/2024 Medical Rec #: [id number]    Accession #:    [id number] Date of Birth: 03-31-1956    Patient Gender: F Patient Age:   68 years Exam Location:  [facility] Procedure:      VAS [place] LOWER EXTREMITY VENOUS (DVT) Referring Phys: PAMELA LOVE --------------------------------------------------------------------------------  Indications: Edema. Other Indications: Rehab patient. Risk Factors: Back surgery on 01/09/2024, decreased mobility. Comparison Study: Previous exam on 05/12/2019 was negative for DVT Performing Technologist: Ernestene Mention RVT, RDMS  Examination Guidelines: A complete evaluation includes B-mode imaging, spectral Doppler, color Doppler, and power Doppler as needed of all accessible portions of each vessel. Bilateral testing is considered an integral part of a complete examination. Limited examinations for reoccurring indications may be performed as noted. The reflux portion of the exam is performed with the patient in reverse Trendelenburg.  +---------+---------------+---------+-----------+----------+--------------+ RIGHT    CompressibilityPhasicitySpontaneityPropertiesThrombus Aging +---------+---------------+---------+-----------+----------+--------------+ CFV      Full           Yes      Yes                                 +---------+---------------+---------+-----------+----------+--------------+ SFJ      Full                                                         +---------+---------------+---------+-----------+----------+--------------+ FV Prox  Full           Yes      Yes                                 +---------+---------------+---------+-----------+----------+--------------+ FV Mid   Full           Yes      Yes                                 +---------+---------------+---------+-----------+----------+--------------+ FV DistalFull           Yes      Yes                                 +---------+---------------+---------+-----------+----------+--------------+ PFV      Full                                                        +---------+---------------+---------+-----------+----------+--------------+  POP      Full           Yes      Yes                                 +---------+---------------+---------+-----------+----------+--------------+ PTV      Full                                                        +---------+---------------+---------+-----------+----------+--------------+ PERO     Full                                                        +---------+---------------+---------+-----------+----------+--------------+   +---------+---------------+---------+-----------+----------+--------------+ LEFT     CompressibilityPhasicitySpontaneityPropertiesThrombus Aging +---------+---------------+---------+-----------+----------+--------------+ CFV      Full           Yes      Yes                                 +---------+---------------+---------+-----------+----------+--------------+ SFJ      Full                                                        +---------+---------------+---------+-----------+----------+--------------+ FV Prox  Full           Yes      Yes                                 +---------+---------------+---------+-----------+----------+--------------+ FV Mid   Full           Yes      Yes                                  +---------+---------------+---------+-----------+----------+--------------+ FV DistalFull           Yes      Yes                                 +---------+---------------+---------+-----------+----------+--------------+ PFV      Full                                                        +---------+---------------+---------+-----------+----------+--------------+ POP      Full           Yes      Yes                                 +---------+---------------+---------+-----------+----------+--------------+ PTV  Full                                                        +---------+---------------+---------+-----------+----------+--------------+ PERO     Full                                                        +---------+---------------+---------+-----------+----------+--------------+     Summary: BILATERAL: - No evidence of deep vein thrombosis seen in the lower extremities, bilaterally. -No evidence of popliteal cyst, bilaterally.   *See table(s) above for measurements and observations. Electronically signed by Coral Else MD on 01/13/2024 at 10:44:45 AM.    Final    [place] EKG SITE RITE Result Date: 01/11/2024 If Site Rite image not attached, placement could not be confirmed due to current cardiac rhythm.  Recent Labs    01/12/24 0643  WBC 7.7  HGB 10.5*  HCT 32.1*  PLT 107*   Recent Labs    01/11/24 2038 01/12/24 0643  NA 134* 138  K 4.3 4.4  CL 97* 99  CO2 26 29  GLUCOSE 231* 165*  BUN 57* 52*  CREATININE 1.86* 1.55*  CALCIUM 9.0 8.9      Intake/Output Summary (Last 24 hours) at 01/13/2024 1202 Last data filed at 01/13/2024 0955 Gross per 24 hour  Intake 670 ml  Output 2100 ml  Net -1430 ml        Physical Exam: Vital Signs Blood pressure (!) 117/56, pulse 94, temperature 97.9 F (36.6 C), resp. rate 18, height 5\' 1"  (1.549 m), weight 90.8 kg, SpO2 96%.  Constitutional: No apparent distress. Appropriate appearance for age. +Obese.  Resting comfortably in bed after going bathroom on BSC.  HENT: No JVD. Neck Supple. Trachea midline.   Eyes: PERRLA. EOMI. Cardiovascular: RRR, no murmurs/rub/gallops. 1+ bilateral LE Edema. Peripheral pulses 2+  Respiratory: CTAB. No rales, rhonchi, or wheezing. On RA.  Abdomen: + bowel sounds, normoactive. No distention or tenderness.  GU:   +Foley with yellow urine Skin: Peripheral IVs intact. Wound vac well adhered to back incision, draining minimal gross blood--not reassessed today Right facial and lip edema   herpetic blisters upper lip vermilion border. Reports stable since surgery-- improved 4/5    PRIOR EXAMS: MSK:      No apparent deformity. + RUE shoulder abduction/flexion limited to <50 degrees       Neurologic exam:  Cognition: AAO to person, place, time and event.  Language: Fluent, No substitutions or neoglisms. No dysarthria. Names 3/3 objects correctly.  Memory: Recalls 3/3 objects at 5 minutes. No apparent deficits  Insight: Good insight into current condition.  Mood: Pleasant affect, appropriate mood. Slightly tearful.  Sensation: To light touch reduced in distal fingertips and toes bilaterally; reduced on bilateral medial knees and R lateral knee.    Reflexes: 2+ in BL UE; hyporeflexic BL LE. No babinski. CN: 2-12 grossly intact.  Coordination: No apparent tremors. No ataxia on FTN bilaterally.  Spasticity: MAS 0 in all extremities.       Strength:                RUE: 4-/5 SA, 5/5 EF, 5/5 EE, 5/5 WE, 5/5  FF, 5/5 FA                LUE:  5/5 SA, 5/5 EF, 5/5 EE, 5/5 WE, 5/5 FF, 5/5 FA                RLE: 2+/5 HF, 4/5 KE, 0/5  DF, 1/5  EHL, 3/5  PF                 LLE:  4-/5 HF, 4-/5 KE, 5-/5  DF, 5-/5  EHL, 5-/5  PF   Assessment/Plan: 1. Functional deficits which require 3+ hours per day of interdisciplinary therapy in a comprehensive inpatient rehab setting. Physiatrist is providing close team supervision and 24 hour management of active medical problems listed  below. Physiatrist and rehab team continue to assess barriers to discharge/monitor patient progress toward functional and medical goals  Care Tool:  Bathing    Body parts bathed by patient: Right arm, Chest, Abdomen, Right upper leg, Left upper leg, Face   Body parts bathed by helper: Left arm, Front perineal area, Buttocks, Right lower leg, Left lower leg     Bathing assist Assist Level: Moderate Assistance - Patient 50 - 74%     Upper Body Dressing/Undressing Upper body dressing   What is the patient wearing?: Button up shirt    Upper body assist Assist Level: Moderate Assistance - Patient 50 - 74%    Lower Body Dressing/Undressing Lower body dressing      What is the patient wearing?: Pants     Lower body assist Assist for lower body dressing: Total Assistance - Patient < 25%     Toileting Toileting    Toileting assist Assist for toileting: 2 Helpers     Transfers Chair/bed transfer  Transfers assist     Chair/bed transfer assist level: Dependent - Patient 0% (Stedy)     Locomotion Ambulation   Ambulation assist   Ambulation activity did not occur: Safety/medical concerns (weakness, pain, decreased balance)          Walk 10 feet activity   Assist  Walk 10 feet activity did not occur: Safety/medical concerns (weakness, pain, decreased balance)        Walk 50 feet activity   Assist Walk 50 feet with 2 turns activity did not occur: Safety/medical concerns (weakness, pain, decreased balance)         Walk 150 feet activity   Assist Walk 150 feet activity did not occur: Safety/medical concerns (weakness, pain, decreased balance)         Walk 10 feet on uneven surface  activity   Assist Walk 10 feet on uneven surfaces activity did not occur: Safety/medical concerns (weakness, pain, decreased balance)         Wheelchair     Assist Is the patient using a wheelchair?: Yes Type of Wheelchair: Manual    Wheelchair assist  level: Dependent - Patient 0% Max wheelchair distance: >178ft    Wheelchair 50 feet with 2 turns activity    Assist        Assist Level: Dependent - Patient 0%   Wheelchair 150 feet activity     Assist      Assist Level: Dependent - Patient 0%   Blood pressure (!) 117/56, pulse 94, temperature 97.9 F (36.6 C), resp. rate 18, height 5\' 1"  (1.549 m), weight 90.8 kg, SpO2 96%.  Medical Problem List and Plan: 1. Functional deficits secondary to  T10 spinal stenosis with myelopathy s/p T7-L2 fusion and replacement  of T10, L1, and T12 screws              -patient may not shower until wound vac is removed              -ELOS/Goals: 9-12 days, SPV PT/OT             -Continue CIR   2.  Antithrombotics: -DVT/anticoagulation:  Mechanical: Sequential compression devices, below knee Bilateral lower extremities  -01/13/24 DVT US neg             -antiplatelet therapy: N/A 3. Acute on chronic pain/Pain Management:  Was on Oxycodone 5 mg and Lyrica 75 mg daily PTA.  --Having surgical pain--continue Oxycodone prn. Pain worse at nights-->will schedule oxycodone at bedtime.  -Voltaren gel QID 4. Mood/Behavior/Sleep: LCSW to folow for evaluation and support.              -antipsychotic agents: N/A  -Cymbalta 60mg  nightly, mirapex 1mg  at bedtime, mirtazapine 30mg  nightly 5. Neuropsych/cognition: This patient is capable of making decisions on his own behalf. 6. Skin/Wound Care: Routine pressure relief measures. Monitor incision for healing.  7. Fluids/Electrolytes/Nutrition: Monitor I/O. Routine labs  -01/12/24 CMP stable today, Cr down to 1.55, AST 44, improving, monitor 8. Neuropathy/band like pain around waist: Will increase Lyrica to TID 9. Osteomyelitis/discitis. Pending PICC placement. -- Starting 6 weeks of daptomycin and ertapenem for 6 weeks through 02/20/24 to be followed by PO suppression doxycycline and ciprofloxacin.  -01/13/24 Cx NGTD x4d 10. HTN:  Monitor BP TID--on torsemide  40mg  daily and coreg 6.125mg  BID (not reordered?). -4/5-6/25 BPs mostly fine, coreg not ordered as of now; remain off for now Vitals:   01/11/24 1850 01/11/24 2258 01/12/24 0423 01/12/24 1355  BP: (!) 118/50 (!) 118/50 (!) 122/56 (!) 149/62   01/12/24 2029 01/13/24 0552  BP: (!) 151/58 (!) 117/56    11. T2DM: Hgb A1C-7.9. Used [name] 50-120/ 5 units ac meal TID at home.              --Continue insulin glargine 20 units daily with SSI.  -4/5-6/25 CBGs a bit better this weekend, monitor trend but may need further adjustments since they're still mostly >200 CBG (last 3)  Recent Labs    01/12/24 1635 01/12/24 2202 01/13/24 0629  GLUCAP 234* 241* 165*    12.  Acute on chronic renal failure: SCr has had rise to 1.56-->will d/c celebrex.  -01/12/24 Cr 1.55 today, down from 1.86 yesterday, monitor Monday 13. Fever blisters: Will start valtrex 1g BID x1wk as symptomatic. Improving, EOT 4/11 14. Right shoulder pain: Question strain but heard a pop this am and extremely concerned that she may have torn her RTC again --Voltaren gel for local measures.  -- Consider MRI if no improvement   15. Constipation: Continue Senna 2 tabs daily--miralax was added on 04/04 -01/12/24 LBM yesterday, didn't get the 32oz miralax so d/c'd. Continue miralax 17g daily for now.  -01/13/24 LBM last night, cont regimen as is for now, adjust accordingly 16. Depression/Anxiety: Continue Cymbalta 60mg  nightly and Mirtazapine 30mg  nightly.   17. Neurogenic bladder: Continue foley. D/c in am if has results with laxative tonight. -01/12/24 had BM last night, will pull foley, do timed toileting and PVRs q4-6h, ISC if PVR >322ml -01/13/24 urinating well, low PVRs, continue checking but could d/c tomorrow if still urinating fine.   18. ABLA: Hgb 10.5 01/12/24, monitor  19. GERD: reordered Protonix 40mg  daily  LOS: 2 days A FACE TO FACE EVALUATION WAS  PERFORMED  Ibtisam Benge 01/13/2024, 12:02 PM

## 2024-01-14 DIAGNOSIS — M4714 Other spondylosis with myelopathy, thoracic region: Secondary | ICD-10-CM | POA: Diagnosis not present

## 2024-01-14 LAB — CK: Total CK: 81 U/L (ref 38–234)

## 2024-01-14 LAB — COMPREHENSIVE METABOLIC PANEL WITH GFR
ALT: 36 U/L (ref 0–44)
AST: 48 U/L — ABNORMAL HIGH (ref 15–41)
Albumin: 2.2 g/dL — ABNORMAL LOW (ref 3.5–5.0)
Alkaline Phosphatase: 203 U/L — ABNORMAL HIGH (ref 38–126)
Anion gap: 7 (ref 5–15)
BUN: 35 mg/dL — ABNORMAL HIGH (ref 8–23)
CO2: 31 mmol/L (ref 22–32)
Calcium: 8.2 mg/dL — ABNORMAL LOW (ref 8.9–10.3)
Chloride: 95 mmol/L — ABNORMAL LOW (ref 98–111)
Creatinine, Ser: 1.48 mg/dL — ABNORMAL HIGH (ref 0.44–1.00)
GFR, Estimated: 39 mL/min — ABNORMAL LOW (ref >60)
Glucose, Bld: 183 mg/dL — ABNORMAL HIGH (ref 70–99)
Potassium: 4.3 mmol/L (ref 3.5–5.1)
Sodium: 133 mmol/L — ABNORMAL LOW (ref 135–145)
Total Bilirubin: 0.8 mg/dL (ref 0.0–1.2)
Total Protein: 5.1 g/dL — ABNORMAL LOW (ref 6.5–8.1)

## 2024-01-14 LAB — GLUCOSE, CAPILLARY
Glucose-Capillary: 164 mg/dL — ABNORMAL HIGH (ref 70–99)
Glucose-Capillary: 262 mg/dL — ABNORMAL HIGH (ref 70–99)
Glucose-Capillary: 253 mg/dL — ABNORMAL HIGH (ref 70–99)
Glucose-Capillary: 176 mg/dL — ABNORMAL HIGH (ref 70–99)

## 2024-01-14 LAB — SEDIMENTATION RATE: Sed Rate: 55 mm/h — ABNORMAL HIGH (ref 0–22)

## 2024-01-14 LAB — C-REACTIVE PROTEIN: CRP: 4.1 mg/dL — ABNORMAL HIGH (ref <1.0)

## 2024-01-14 LAB — AEROBIC/ANAEROBIC CULTURE W GRAM STAIN (SURGICAL/DEEP WOUND)
Culture: NO GROWTH
Gram Stain: NONE SEEN

## 2024-01-14 LAB — CBC
HCT: 28.3 % — ABNORMAL LOW (ref 36.0–46.0)
Hemoglobin: 9.2 g/dL — ABNORMAL LOW (ref 12.0–15.0)
MCH: 30 pg (ref 26.0–34.0)
MCHC: 32.5 g/dL (ref 30.0–36.0)
MCV: 92.2 fL (ref 80.0–100.0)
Platelets: 97 10*3/uL — ABNORMAL LOW (ref 150–400)
RBC: 3.07 MIL/uL — ABNORMAL LOW (ref 3.87–5.11)
RDW: 15.1 % (ref 11.5–15.5)
WBC: 6.3 10*3/uL (ref 4.0–10.5)
nRBC: 0 % (ref 0.0–0.2)

## 2024-01-14 MED ORDER — INSULIN GLARGINE-YFGN 100 UNIT/ML ~~LOC~~ SOLN
25.0000 [IU] | Freq: Every day | SUBCUTANEOUS | Status: DC
  Administered 2024-01-15 – 2024-01-22 (×8): 25 [IU] via SUBCUTANEOUS
  Filled 2024-01-14 (×8): qty 0.25

## 2024-01-14 MED ORDER — TORSEMIDE 20 MG PO TABS
20.0000 mg | ORAL_TABLET | Freq: Every day | ORAL | Status: DC
  Administered 2024-01-15: 20 mg via ORAL
  Filled 2024-01-14: qty 1

## 2024-01-14 MED ORDER — SENNOSIDES-DOCUSATE SODIUM 8.6-50 MG PO TABS
2.0000 | ORAL_TABLET | Freq: Two times a day (BID) | ORAL | Status: DC
  Administered 2024-01-14 – 2024-02-01 (×36): 2 via ORAL
  Filled 2024-01-14 (×37): qty 2

## 2024-01-14 NOTE — Progress Notes (Signed)
 Inpatient Rehabilitation  Patient information reviewed and entered into eRehab system by [facility]. Karen Kays., CCC/SLP, PPS Coordinator.  Information including medical coding, functional ability and quality indicators will be reviewed and updated through discharge.

## 2024-01-14 NOTE — Progress Notes (Signed)
 Inpatient Rehabilitation Care Coordinator Assessment and Plan Patient Details  Name: Katelyn [name] MRN: [id number] Date of Birth: 1956/05/18  Today's Date: 01/14/2024  Hospital Problems: Principal Problem:   Thoracic myelopathy  Past Medical History:  Past Medical History:  Diagnosis Date   Anxiety    Arthritis    Bursitis of right hip    CAD (coronary artery disease)    Cardiac catheterization June 2014 in [place] - 50% circumflex stenosis   Chest pain, neg MI, stable CAD non obstructive on cath 10/05/20 10/04/2020   Chronic diastolic heart failure (HCC) 08/20/2017   Chronic kidney disease, stage 3 (HCC)    does not see nephrologist   Chronic low back pain without sciatica 03/14/2016   Cirrhosis of liver (HCC)    CKD (chronic kidney disease), stage III (HCC) 10/07/2020   Complication of anesthesia    Cough 04/19/2017   Overview:  Last Assessment & Plan:  Formatting of this note may be different from the original. Cough - ? ACE related with AR triggers   Plan  Patient Instructions  Discuss with your primary doctor that lisinopril pain, need making your cough worse. May use Mucinex DM twice daily as needed for cough and congestion Zyrtec 10 mg at bedtime as needed for drainage Saline nasal spray as needed. Lab tests today Activity as tolerated. Follow with Dr. Craige Cotta in 3-4 months and As needed   Please contact office for sooner follow up if symptoms do not improve or worsen or seek emergency care    Depression    Dyspnea    with exertion   " lazy lung" - per  Dr Craige Cotta from back issues- 06/2016   Elevated liver enzymes 12/05/2016   Essential hypertension    GERD (gastroesophageal reflux disease)    Gout 03/14/2016   Greater trochanteric bursitis of right hip 02/02/2012   H/O hiatal hernia    Heart murmur    History of blood transfusion 2016   History of esophageal stricture 10/07/2020   History of kidney stones    Hypercholesterolemia    Hypertensive heart disease with heart  failure (HCC) 01/01/2017   Hypoxia 10/07/2020   Iliotibial band syndrome of right side 02/02/2012   Iron deficiency anemia due to chronic blood loss 03/14/2016   LBBB (left bundle branch block) 01/01/2017   Left bundle branch block    Leg weakness 10/07/2020   Lumbar stenosis    Meralgia paraesthetica 12/05/2016   Mild CAD 11/24/2015   Morbid obesity (HCC) 10/07/2020   Neuropathy    OSA (obstructive sleep apnea) 05/24/2016   Overview:  Managed PULM- no CPAP   PONV (postoperative nausea and vomiting)    "no N/V with patch"   Restless leg syndrome    S/P lumbar laminectomy 11/26/2015   S/P lumbar spinal fusion 08/29/2016   Tinnitus 12/05/2016   Type 2 diabetes mellitus (HCC)    UTI (urinary tract infection) 10/07/2020   Past Surgical History:  Past Surgical History:  Procedure Laterality Date   ABDOMINAL HYSTERECTOMY  1983   APPENDECTOMY  Age 68   BACK SURGERY     FIRST LUMBAR FUSION/ SURGERY APRIL 2012 AND FUSION WITH INSTRUMENTATION SEPT 2012   CARDIAC CATHETERIZATION     x 2   CARPAL TUNNEL RELEASE     bil   CHOLECYSTECTOMY  1990's   COLONOSCOPY  12/12/2017   Colonic polyp status post polypectomy. Mild sigmoid diverticulosis. Otherwise normal colonoscopy to terminal ileum.   CYSTO EXTRACTION KIDNEY STONES  ESOPHAGOGASTRODUODENOSCOPY  12/12/2017   Small hiatal hernia. Mild gastritis. Status post esophageal dilatation.   EXCISION/RELEASE BURSA HIP  02/02/2012   Procedure: EXCISION/RELEASE BURSA HIP;  Surgeon: Jacki Cones, MD;  Location: [facility] ORS;  Service: Orthopedics;  Laterality: Right;  Right Hip Bursectomy   EYE SURGERY Bilateral    cataracts   IR THORACIC DISC ASPIRATION W/IMG GUIDE  02/14/2023   KIDNEY STONE SURGERY  2008   LAMINECTOMY WITH POSTERIOR LATERAL ARTHRODESIS LEVEL 3 N/A 01/09/2024   Procedure: Posterior lateral fusion - Thoracic Seven-Thoracic Eight - Thoracic Eight-Thoracic Nine - Thoracic Nine-Thoracic Ten, Thoracic Ten-Eleven, Thoracic  Eleven-Twelve, Thoracic Twelve -Lumbar One, extension of fusion and removal of Thoracic Ten screws;  Surgeon: Arman Bogus, MD;  Location: [facility] OR;  Service: Neurosurgery;  Laterality: N/A;  Posterior lateral fusion - Thoracic Seven-Thora   LAMINECTOMY WITH POSTERIOR LATERAL ARTHRODESIS LEVEL 4 N/A 02/19/2023   Procedure: THORACIC TEN-LUMBAR TWO INSTRUMENTED FUSION;  Surgeon: Tia Alert, MD;  Location[facility] OR;  Service: Neurosurgery;  Laterality: N/A;   Left knee surgery x 2  1996   reconstruction   LUMBAR DISC SURGERY  08/2016   LUMBAR LAMINECTOMY/DECOMPRESSION MICRODISCECTOMY Right 11/26/2015   Procedure: Extraforaminal Microdiscectomy  - Lumbar two-three- right;  Surgeon: Tia Alert, MD;  Location: [facility] OR[facility];  Service: Neurosurgery;  Laterality: Right;  right    LUMBAR WOUND DEBRIDEMENT N/A 10/25/2016   Procedure: Lumbar wound revision;  Surgeon: Tia Alert, MD;  Location: [facility] OR;  Service: Neurosurgery;  Laterality: N/A;  Lumbar wound revision   Right shoulder surgery  2010   spur   RIGHT/LEFT HEART CATH AND CORONARY ANGIOGRAPHY N/A 10/05/2020   Procedure: RIGHT/LEFT HEART CATH AND CORONARY ANGIOGRAPHY;  Surgeon: [place], Peter M, MD;  Location: [facility] INVASIVE CV LAB;  Service: Cardiovascular;  Laterality: N/A;   SPINAL CORD STIMULATOR REMOVAL  02/19/2023   Procedure: LUMBAR SPINAL CORD STIMULATOR REMOVAL;  Surgeon: Tia Alert, MD;  Location: [facility] OR;  Service: Neurosurgery;;   Social History:  reports that she has never smoked. She has never used smokeless tobacco. She reports that she does not currently use alcohol. She reports that she does not use drugs.  Family / Support Systems Marital Status: Married How Long?: 48 years (anniversary in Dec) Patient Roles: Spouse Spouse/Significant Other: Dorene Sorrow (husband) Children: son is deceased; died at 55 yrs old in a car accident Other Supports: none Anticipated Caregiver: husband Ability/Limitations of Caregiver: Pt husband  reports he is not able to lift her as he did before. Reports that he would like for her to be able to use her legs at time of discharge. 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 - How often do you need to have someone help you when you read instructions, pamphlets, or other written material from your doctor or pharmacy?: Never Writes: Yes Employment Status: Disabled Date Retired/Disabled/Unemployed: 2010 Marine scientist Issues: Denies Guardian/Conservator: N/A   Abuse/Neglect Abuse/Neglect Assessment Can Be Completed: Yes Physical Abuse: Denies Verbal Abuse: Denies Sexual Abuse: Denies Exploitation of patient/patient's resources: Denies Self-Neglect: Denies  Patient response to: Social Isolation - How often do you feel lonely or isolated from those around you?: Never  Emotional Status Pt's affect, behavior and adjustment status: Pt in good spirits at time of visit, despite being in/out  of sleep during assessment. Pt became tearful as she reports this is normally not like me and I don't feel like myself and I am tired. SW shared concerns with nursing. Recent Psychosocial Issues: See above Psychiatric History: Pt reports depression since passing of he son in 1999 Substance Abuse History: Denies  Patient / Family Perceptions, Expectations & Goals Pt/Family understanding of illness & functional limitations: Pt and husband have a general understanding Premorbid pt/family roles/activities: Assistance with ADLs/IADLs Anticipated changes in roles/activities/participation: continued assistnace Pt/family expectations/goals: Pt would like to work on her hsolder, back, and strength in legs; Pt husband would like if pt is able to use her legs before returnign  home.  Community Resources [employer]: None Premorbid Home Care/DME Agencies: None Transportation available at discharge: Husband Is the patient able to respond to transportation needs?: Yes In the past 12 months, has lack of transportation kept you from medical appointments or from getting medications?: No In the past 12 months, has lack of transportation kept you from meetings, work, or from getting things needed for daily living?: No Resource referrals recommended: Neuropsychology  Discharge Planning Living Arrangements: Spouse/significant other Support Systems: Spouse/significant other Type of Residence: Private residence Insurance Resources: Media planner (specify) ([employer] advantage) Surveyor, quantity Resources: Restaurant manager, fast food Screen Referred: No Living Expenses: Banker Management: Spouse Does the patient have any problems obtaining your medications?: No Home Management: Pt reports she and husband managed all home care needs Patient/Family Preliminary Plans: Husband Care Coordinator Barriers to Discharge: Home environment access/layout, Lack of/limited family support, Insurance for SNF coverage Care Coordinator Anticipated Follow Up Needs: HH/OP Expected length of stay: 14-21 days  Clinical Impression SW met with pt at bedside to introduce self, explain role, discuss discharge process, and inform on ELOS. Pt is not a Cytogeneticist. NO HCPOA. DME: RW, w/c.   Calton Harshfield A Sakiyah Shur 01/14/2024, 2:40 PM

## 2024-01-14 NOTE — Progress Notes (Signed)
 Patient ID: Katelyn [name], female   DOB: 06/08/1956, 68 y.o.   MRN: [id number]  [phone] SW spoke with [name] to introduce self, explain role, discuss discharge process, and inform on ELOS.  Confirms he will be primary caregiver. Reports he will need for his wife to be able to use her legs as PTA, he was picking her up and he really is not able to do that anymore. SW shared once d/c set, there will be family edu for him to see her progress. SW will follow-up with updates after team conference.  Cecile Sheerer, MSW, LCSW Office: [phone] Cell: [phone] Fax: [phone]

## 2024-01-14 NOTE — Progress Notes (Signed)
 Physical Therapy Session Note  Patient Details  Name: Katelyn [name] MRN: [id number] Date of Birth: [birth year]  Today's Date: 01/14/2024 PT Individual Time: 1300-1400 PT Individual Time Calculation (min): 60 min  and Today's Date: 01/14/2024 PT Missed Time: 15 Minutes Missed Time Reason: Patient fatigue  Short Term Goals: Week 1:  PT Short Term Goal 1 (Week 1): pt will perform bed mobility with mod A consistantly PT Short Term Goal 2 (Week 1): pt will transfer sit<>stand with LRAD and mod A consistantly PT Short Term Goal 3 (Week 1): pt will ambulate 45ft with LRAD and max A of 1  Skilled Therapeutic Interventions/Progress Updates:      Therapy Documentation Precautions:  Precautions Precautions: Fall, Back Recall of Precautions/Restrictions: Intact (Pt able to recall 3/3 spinal precautions.) Precaution/Restrictions Comments: Verbally reviewed spinal precautions, re-educated on acronym BLT. Required Braces or Orthoses: Spinal Brace Spinal Brace: Thoracolumbosacral orthotic, Applied in sitting position Restrictions Weight Bearing Restrictions Per Provider Order: No  Pt agreeable to PT and with unrated right shoulder pain, provided rest and repositioning for relief as pt declined medicine. Pt reports feel "off", "loopy" and not herself and PT informed pt MD relates feelings may be related to pain medication. Pt mod A with supine>sit with HOB elevated and min A for static sitting balance x 30 minutes. Pt engaged in blocked practice of 10 sit<>stand transfers with PT anterior to pt for bilateral knee block. Initially pt max A and progressed to min A with repetitions. Pt able to perform lateral steps to the right max A towards head of bed and max A with sit to lying. Pt left semi-reclined in bed, with all needs in reach and alarm on.     Therapy/Group: Individual Therapy  Truitt Leep Truitt Leep PT, DPT  01/14/2024, 3:39 PM

## 2024-01-14 NOTE — Progress Notes (Signed)
 PROGRESS NOTE   Subjective/Complaints: Labs this a.m. significant for mild hyponatremia 133, BUN and creatinine improved.  ALP, AST uptrending.  CRP also increased to 4.1.  Hemoglobin down from 10.5-9.2.  Patient complaining of burning at PICC line site overnight, IV team contacted, pending Bilateral lower extremity duplex negative Mildly tachycardic overnight, blood pressure diastolic low, otherwise stable.  P.o. fluid intakes minimal, net -500 to -2000 cc over the last 2 days 1 episode of urinary incontinence overnight with bladder scan 173; otherwise continent Last bowel movement recorded 4-4, medium, hard  Patient with minimal complaints today.  Feels she is moving a bit better, lip is feeling much better.  ROS: as per HPI. Denies CP, SOB, abd pain, N/V/D/C, or any other complaints at this time.    Objective:   VAS [place] LOWER EXTREMITY VENOUS (DVT) Result Date: 01/13/2024  Lower Venous DVT Study Patient Name:  NZINGA FERRAN Coia  Date of Exam:   01/13/2024 Medical Rec #: [id number]    Accession #:    [id number] Date of Birth: 25-Jul-1956    Patient Gender: F Patient Age:   68 years Exam Location:  [facility] [place] Procedure:      VAS [place] LOWER EXTREMITY VENOUS (DVT) Referring Phys: PAMELA LOVE --------------------------------------------------------------------------------  Indications: Edema. Other Indications: Rehab patient. Risk Factors: Back surgery on 01/09/2024, decreased mobility. Comparison Study: Previous exam on 05/12/2019 was negative for DVT Performing Technologist: Ernestene Mention RVT, RDMS  Examination Guidelines: A complete evaluation includes B-mode imaging, spectral Doppler, color Doppler, and power Doppler as needed of all accessible portions of each vessel. Bilateral testing is considered an integral part of a complete examination. Limited examinations for reoccurring indications may be performed as noted. The reflux portion of the exam is  performed with the patient in reverse Trendelenburg.  +---------+---------------+---------+-----------+----------+--------------+ RIGHT    CompressibilityPhasicitySpontaneityPropertiesThrombus Aging +---------+---------------+---------+-----------+----------+--------------+ CFV      Full           Yes      Yes                                 +---------+---------------+---------+-----------+----------+--------------+ SFJ      Full                                                        +---------+---------------+---------+-----------+----------+--------------+ FV Prox  Full           Yes      Yes                                 +---------+---------------+---------+-----------+----------+--------------+ FV Mid   Full           Yes      Yes                                 +---------+---------------+---------+-----------+----------+--------------+  FV DistalFull           Yes      Yes                                 +---------+---------------+---------+-----------+----------+--------------+ PFV      Full                                                        +---------+---------------+---------+-----------+----------+--------------+ POP      Full           Yes      Yes                                 +---------+---------------+---------+-----------+----------+--------------+ PTV      Full                                                        +---------+---------------+---------+-----------+----------+--------------+ PERO     Full                                                        +---------+---------------+---------+-----------+----------+--------------+   +---------+---------------+---------+-----------+----------+--------------+ LEFT     CompressibilityPhasicitySpontaneityPropertiesThrombus Aging +---------+---------------+---------+-----------+----------+--------------+ CFV      Full           Yes      Yes                                  +---------+---------------+---------+-----------+----------+--------------+ SFJ      Full                                                        +---------+---------------+---------+-----------+----------+--------------+ FV Prox  Full           Yes      Yes                                 +---------+---------------+---------+-----------+----------+--------------+ FV Mid   Full           Yes      Yes                                 +---------+---------------+---------+-----------+----------+--------------+ FV DistalFull           Yes      Yes                                 +---------+---------------+---------+-----------+----------+--------------+ PFV      Full                                                        +---------+---------------+---------+-----------+----------+--------------+  POP      Full           Yes      Yes                                 +---------+---------------+---------+-----------+----------+--------------+ PTV      Full                                                        +---------+---------------+---------+-----------+----------+--------------+ PERO     Full                                                        +---------+---------------+---------+-----------+----------+--------------+     Summary: BILATERAL: - No evidence of deep vein thrombosis seen in the lower extremities, bilaterally. -No evidence of popliteal cyst, bilaterally.   *See table(s) above for measurements and observations. Electronically signed by Coral Else MD on 01/13/2024 at 10:44:45 AM.    Final    Recent Labs    01/12/24 0643 01/14/24 0435  WBC 7.7 6.3  HGB 10.5* 9.2*  HCT 32.1* 28.3*  PLT 107* 97*   Recent Labs    01/12/24 0643 01/14/24 0435  NA 138 133*  K 4.4 4.3  CL 99 95*  CO2 29 31  GLUCOSE 165* 183*  BUN 52* 35*  CREATININE 1.55* 1.48*  CALCIUM 8.9 8.2*      Intake/Output Summary (Last 24 hours) at 01/14/2024 0752 Last  data filed at 01/14/2024 0635 Gross per 24 hour  Intake 397 ml  Output 1225 ml  Net -828 ml        Physical Exam: Vital Signs Blood pressure (!) 129/55, pulse (!) 102, temperature 98.6 F (37 C), resp. rate 18, height 5\' 1"  (1.549 m), weight 90.8 kg, SpO2 95%.  Constitutional: No apparent distress. Appropriate appearance for age. +Obese.  Laying in bed.   HENT: No JVD. Neck Supple. Trachea midline.  Lip swelling and vesicular rash significantly reduced. Eyes: PERRLA. EOMI. Cardiovascular: RRR, no murmurs/rub/gallops.  Trace bilateral LE Edema. Peripheral pulses 2+  Respiratory: CTAB. No rales, rhonchi, or wheezing. On RA.  Abdomen: + bowel sounds, normoactive. No distention or tenderness.  Skin: Peripheral IVs intact. Wound vac well adhered to back incision, no active drainage.  Hemovac removed over the weekend.   MSK:      No apparent deformity. + RUE shoulder abduction/flexion limited to <50 degrees--not reassessed 4-7       Neurologic exam:  Cognition: AAO to person, place, time and event.  Language: Fluent, No substitutions or neoglisms. No dysarthria. Names 3/3 objects correctly.  Memory: Recalls 3/3 objects at 5 minutes. No apparent deficits  Insight: Good insight into current condition.  Mood: Pleasant affect, appropriate mood. Slightly tearful.  Sensation: To light touch reduced in distal fingertips and toes bilaterally; reduced on bilateral medial knees and R lateral knee.    Reflexes: 2+ in BL UE; hyporeflexic BL LE. No babinski. CN: 2-12 grossly intact.  Coordination: No apparent tremors. No ataxia on FTN bilaterally.  Spasticity: MAS 0 in all extremities.       Strength:  RUE: 4-/5 SA, 5/5 EF, 5/5 EE, 5/5 WE, 5/5 FF, 5/5 FA                LUE:  5/5 SA, 5/5 EF, 5/5 EE, 5/5 WE, 5/5 FF, 5/5 FA                RLE: 2+/5 HF, 4/5 KE, 3-/5  DF, 2/5  EHL, 3/5  PF                 LLE:  3-/5 HF, 4-/5 KE, 5-/5  DF, 5-/5  EHL, 5-/5  PF   Assessment/Plan: 1.  Functional deficits which require 3+ hours per day of interdisciplinary therapy in a comprehensive inpatient rehab setting. Physiatrist is providing close team supervision and 24 hour management of active medical problems listed below. Physiatrist and rehab team continue to assess barriers to discharge/monitor patient progress toward functional and medical goals  Care Tool:  Bathing    Body parts bathed by patient: Right arm, Chest, Abdomen, Right upper leg, Left upper leg, Face   Body parts bathed by helper: Left arm, Front perineal area, Buttocks, Right lower leg, Left lower leg     Bathing assist Assist Level: Moderate Assistance - Patient 50 - 74%     Upper Body Dressing/Undressing Upper body dressing   What is the patient wearing?: Button up shirt    Upper body assist Assist Level: Moderate Assistance - Patient 50 - 74%    Lower Body Dressing/Undressing Lower body dressing      What is the patient wearing?: Pants     Lower body assist Assist for lower body dressing: Total Assistance - Patient < 25%     Toileting Toileting    Toileting assist Assist for toileting: 2 Helpers     Transfers Chair/bed transfer  Transfers assist     Chair/bed transfer assist level: Dependent - Patient 0% (Stedy)     Locomotion Ambulation   Ambulation assist   Ambulation activity did not occur: Safety/medical concerns (weakness, pain, decreased balance)          Walk 10 feet activity   Assist  Walk 10 feet activity did not occur: Safety/medical concerns (weakness, pain, decreased balance)        Walk 50 feet activity   Assist Walk 50 feet with 2 turns activity did not occur: Safety/medical concerns (weakness, pain, decreased balance)         Walk 150 feet activity   Assist Walk 150 feet activity did not occur: Safety/medical concerns (weakness, pain, decreased balance)         Walk 10 feet on uneven surface  activity   Assist Walk 10 feet on  uneven surfaces activity did not occur: Safety/medical concerns (weakness, pain, decreased balance)         Wheelchair     Assist Is the patient using a wheelchair?: Yes Type of Wheelchair: Manual    Wheelchair assist level: Dependent - Patient 0% Max wheelchair distance: >131ft    Wheelchair 50 feet with 2 turns activity    Assist        Assist Level: Dependent - Patient 0%   Wheelchair 150 feet activity     Assist      Assist Level: Dependent - Patient 0%   Blood pressure (!) 129/55, pulse (!) 102, temperature 98.6 F (37 C), resp. rate 18, height 5\' 1"  (1.549 m), weight 90.8 kg, SpO2 95%.  Medical Problem List and Plan: 1. Functional deficits secondary to  T10 spinal stenosis with myelopathy s/p T7-L2 fusion and replacement of T10, L1, and T12 screws              -patient may not shower until wound vac is removed              -ELOS/Goals: 9-12 days, SPV PT/OT             -Continue CIR   2.  Antithrombotics: -DVT/anticoagulation:  Mechanical: Sequential compression devices, below knee Bilateral lower extremities  -01/13/24 DVT US neg              -antiplatelet therapy: N/A 3. Acute on chronic pain/Pain Management:  Was on Oxycodone 5 mg and Lyrica 75 mg daily PTA.  --Having surgical pain--continue Oxycodone prn. Pain worse at nights-->will schedule oxycodone at bedtime.  -Voltaren gel QID -7-4: DC morphine due to no utilization  4. Mood/Behavior/Sleep: LCSW to folow for evaluation and support.              -antipsychotic agents: N/A  -Cymbalta 60mg  nightly, mirapex 1mg  at bedtime, mirtazapine 30mg  nightly 5. Neuropsych/cognition: This patient is capable of making decisions on his own behalf. 6. Skin/Wound Care: Routine pressure relief measures. Monitor incision for healing.   4/7: Neurosurgery removed Hemovac over the weekend, current VAC with no drainage, if no further output in 24 hours will discuss removal with Dr. Yetta Barre.  7.  Fluids/Electrolytes/Nutrition: Monitor I/O. Routine labs  -01/12/24 CMP stable today, Cr down to 1.55, AST 44, improving, monitor  4-7: LFTs mildly up trending, BUN and creatinine down, mild hyponatremia.  Repeat in 2 to 3 days.  8. Neuropathy/band like pain around waist: Will increase Lyrica to TID 9. Osteomyelitis/discitis. Pending PICC placement. -- Starting 6 weeks of daptomycin and ertapenem for 6 weeks through 02/20/24 to be followed by PO suppression doxycycline and ciprofloxacin.  -01/13/24 Cx NGTD x4d 4-7: ESR added on to a.m. labs.  CRP up today; discussed with ID, no new orders.  10. HTN:  Monitor BP TID--on torsemide 40mg  daily and coreg 6.125mg  BID (not reordered?). -4/5-6/25 BPs mostly fine, coreg not ordered as of now; remain off for now 4-7: Systolic low, p.o. intakes minimal.  Reduce torsemide for tomorrow a.m. to 20 mg, and encourage p.o. fluids today.  Vitals:   01/11/24 1850 01/11/24 2258 01/12/24 0423 01/12/24 1355  BP: (!) 118/50 (!) 118/50 (!) 122/56 (!) 149/62   01/12/24 2029 01/13/24 0552 01/13/24 1426 01/13/24 1958  BP: (!) 151/58 (!) 117/56 (!) 134/48 (!) 131/57   01/14/24 0505  BP: (!) 129/55    11. T2DM: Hgb A1C-7.9. Used [name] 50-120/ 5 units ac meal TID at home.              --Continue insulin glargine 20 units daily with SSI.  -4/5-6/25 CBGs a bit better this weekend, monitor trend but may need further adjustments since they're still mostly >200 4/7: Blood sugars remain elevated, increase glargine to 25 units daily starting tomorrow a.m. CBG (last 3)  Recent Labs    01/13/24 1634 01/13/24 2104 01/14/24 0608  GLUCAP 184* 285* 164*    12.  Acute on chronic renal failure: SCr has had rise to 1.56-->will d/c celebrex.  -01/12/24 Cr 1.55 today, down from 1.86 yesterday, monitor Monday  4-7: BUN, creatinine improving.  13. Fever blisters: Will start valtrex 1g BID x1wk as symptomatic. Improving, EOT 4/11 14. Right shoulder pain: Question strain but heard a  pop this am and extremely concerned that she may have  torn her RTC again --Voltaren gel for local measures.  -- Consider MRI if no improvement   15. Constipation: Continue Senna 2 tabs daily--miralax was added on 04/04 -01/12/24 LBM yesterday, didn't get the 32oz miralax so d/c'd. Continue miralax 17g daily for now.  -01/13/24 LBM last night, cont regimen as is for now, adjust accordingly 4-7: Increase Senokot-S to 2 tabs twice daily, MiraLAX BID  16. Depression/Anxiety: Continue Cymbalta 60mg  nightly and Mirtazapine 30mg  nightly.   17. Neurogenic bladder: Continue foley. D/c in am if has results with laxative tonight. -01/12/24 had BM last night, will pull foley, do timed toileting and PVRs q4-6h, ISC if PVR >38ml -01/13/24 urinating well, low PVRs, continue checking but could d/c tomorrow if still urinating fine.  4-7: Single episode of incontinence overnight, with PVR 170s.  Otherwise continent.  Continue current regimen.  18. ABLA: Hgb 10.5 01/12/24, monitor  -4-7: Hemoglobin down from 10.5-9.4; no overt signs of bleeding, repeat in 1 to 2 days  19. GERD: reordered Protonix 40mg  daily  20.  Transaminitis.  Uptrending on 4-7 labs.  -May be stasis related due to poor mobility; Tylenol utilization within parameters  -Repeat in 2 days  LOS: 3 days A FACE TO FACE EVALUATION WAS PERFORMED  [name] 01/14/2024, 7:52 AM

## 2024-01-14 NOTE — IPOC Note (Signed)
 Overall Plan of Care [facility]) Patient Details Name: Katelyn [name] MRN: [id number] DOB: January 21, 1956  Admitting Diagnosis: Thoracic myelopathy  Hospital Problems: Principal Problem:   Thoracic myelopathy     Functional Problem List: Nursing Bladder, Bowel, Endurance, Pain, Safety, Sensory, Skin Integrity  PT Balance, Edema, Endurance, Motor, Nutrition, Pain, Safety, Skin Integrity  OT Balance, Edema, Endurance, Motor, Pain, Perception, Safety, Sensory, Skin Integrity  SLP    TR         Basic ADL's: OT Bathing, Dressing, Toileting     Advanced  ADL's: OT       Transfers: PT Bed Mobility, Bed to Chair, Car, Occupational psychologist, Research scientist (life sciences): PT Ambulation, Psychologist, prison and probation services, Stairs     Additional Impairments: OT    SLP        TR      Anticipated Outcomes Item Anticipated Outcome  Self Feeding    Swallowing      Basic self-care  Min A  Toileting  Min A   Bathroom Transfers Min A  Bowel/Bladder  manage bowels with medications/manage bladder with voiding trials  Transfers  min A with LRAD  Locomotion  min A with LRAD  Communication     Cognition     Pain  <4 w/ prns  Safety/Judgment  manage safety with minimal assistance   Therapy Plan: PT Intensity: Minimum of 1-2 x/day ,45 to 90 minutes PT Frequency: 5 out of 7 days PT Duration Estimated Length of Stay: 2-3 weeks OT Intensity: Minimum of 1-2 x/day, 45 to 90 minutes OT Frequency: 5 out of 7 days OT Duration/Estimated Length of Stay: 14-21 days     Team Interventions: Nursing Interventions Patient/Family Education, Pain Management, Bladder Management, Bowel Management, Disease Management/Prevention, Skin Care/Wound Management, Medication Management, Discharge Planning  PT interventions Ambulation/gait training, Discharge planning, Psychosocial support, Functional mobility training, Therapeutic Activities, Balance/vestibular training, Disease management/prevention, Neuromuscular  re-education, Skin care/wound management, Therapeutic Exercise, Wheelchair propulsion/positioning, Cognitive remediation/compensation, DME/adaptive equipment instruction, Pain management, Splinting/orthotics, UE/LE Strength taining/ROM, Community reintegration, Development worker, international aid stimulation, Patient/family education, Museum/gallery curator, UE/LE Coordination activities  OT Interventions Warden/ranger, Cognitive remediation/compensation, Firefighter, Discharge planning, Disease mangement/prevention, Fish farm manager, Neuromuscular re-education, Functional mobility training, Functional electrical stimulation, Pain management, Patient/family education, Psychosocial support, Splinting/orthotics, Skin care/wound managment, Self Care/advanced ADL retraining, Therapeutic Activities, Therapeutic Exercise, UE/LE Strength taining/ROM, Wheelchair propulsion/positioning, Visual/perceptual remediation/compensation, UE/LE Coordination activities  SLP Interventions    TR Interventions    SW/CM Interventions Discharge Planning, Psychosocial Support, Patient/Family Education   Barriers to Discharge MD  Medical stability, Home enviroment access/loayout, Neurogenic bowel and bladder, Wound care, Lack of/limited family support, and Insurance for SNF coverage  Nursing Decreased caregiver support, Home environment access/layout, IV antibiotics, Neurogenic Bowel & Bladder Discharge: House  Discharge Home Layout: One level  Discharge Home Access: Ramped entrance  PT Wound Care, Other (comments) pain, spinal precautions, weakness/deconditioning, decreased balance/coordination, impaired sequencng with RLE  OT IV antibiotics, Wound Care, Weight    SLP      SW Home environment access/layout, Lack of/limited family support, Community education officer for SNF coverage     Team Discharge Planning: Destination: PT-Home ,OT- Home , SLP-  Projected Follow-up: PT-Home health PT, OT-  Home health OT, SLP-   Projected Equipment Needs: PT-To be determined, OT- To be determined, SLP-  Equipment Details: PT-has RW, rollator, WC, and PFRW, OT-  Patient/family involved in discharge planning: PT- Patient, Family member/caregiver,  OT-Patient, Family member/caregiver, SLP-   MD ELOS: 9-12 days Medical  Rehab Prognosis:  Good Assessment: The patient has been admitted for CIR therapies with the diagnosis of T10 spinal stenosis with myelopathy s/p T7-L2 fusion and replacement of T10, L1, and T12 screws . The team will be addressing functional mobility, strength, stamina, balance, safety, adaptive techniques and equipment, self-care, bowel and bladder mgt, patient and caregiver education. Goals have been set at supervision PT, OT. Anticipated discharge destination is home.       See Team Conference Notes for weekly updates to the plan of care

## 2024-01-14 NOTE — Progress Notes (Signed)
 Occupational Therapy Session Note  Patient Details  Name: Katelyn [name] MRN: [id number] Date of Birth: [birth year]  {CHL IP REHAB OT TIME CALCULATIONS:[id number]}   Short Term Goals: Week 1:  OT Short Term Goal 1 (Week 1): Pt will thread BLE into LB garments with Min A + LRAD. OT Short Term Goal 2 (Week 1): Pt will complete 1/3 toileting activities with Min A + LRAD. OT Short Term Goal 3 (Week 1): Pt will perform toilet transfer with consistent Mod A.  Skilled Therapeutic Interventions/Progress Updates:      Therapy Documentation Precautions:  Precautions Precautions: Fall, Back Recall of Precautions/Restrictions: Intact (Pt able to recall 3/3 spinal precautions.) Precaution/Restrictions Comments: Verbally reviewed spinal precautions, re-educated on acronym BLT. Required Braces or Orthoses: Spinal Brace Spinal Brace: Thoracolumbosacral orthotic, Applied in sitting position Restrictions Weight Bearing Restrictions Per Provider Order: No   Therapy/Group: Individual Therapy  Lou Cal, OTR/L, MSOT  01/14/2024, 9:06 PM

## 2024-01-14 NOTE — Progress Notes (Signed)
 Physical Therapy Session Note  Patient Details  Name: Katelyn [name] MRN: [id number] Date of Birth: Mar 03, 1956  Today's Date: 01/14/2024 PT Individual Time: 4010-2725 PT Individual Time Calculation (min): 42 min   Short Term Goals: Week 1:  PT Short Term Goal 1 (Week 1): pt will perform bed mobility with mod A consistantly PT Short Term Goal 2 (Week 1): pt will transfer sit<>stand with LRAD and mod A consistantly PT Short Term Goal 3 (Week 1): pt will ambulate 8ft with LRAD and max A of 1  Skilled Therapeutic Interventions/Progress Updates:     Pt presents in room in bed, agreeable to PT. Pt reporting difficulty with wordfinding and expressing concern for stroke. Pt educated on hospital course and provided with therapeutic use of self to ease anxiety, with education on medication effects on thinking skills. Pt demonstrating improved alertness and word finding once OOB. Session focused on therapeutic activities for training bed mobility, transfers, tolerance to upright, and self care tasks.  Pt requires total assist for donning socks and threading pants. Pt completes bed mobility with max elevated HOB, max assist for BLEs off bed and trunk to upright, maintaining spinal precautions. TLSO donned with pt in sitting on EOB. Pt completes sit to stand to stedy with increased time, min assist. Pt transported to sink via stedy. Pt maintains high perched/standing position in stedy for 15 minutes for completing oral hygiene and managing hair, completed to promote improved tolreance to standing. Pt able to manage oral hygiene and hair with supervision from high perched position.  Pt transfrerred to [facility] via stedy with mod assist for controlling descent. Pt reporting TLSO not fitting and feeling chest plate riding under chin, shoulder straps adjusted to fit beneath pt axilla for improving pt comfort and tolerance to TLSO wear. Pt remains seated in WC with all needs within reach, cal light in place at end of  session.   Therapy Documentation Precautions:  Precautions Precautions: Fall, Back Recall of Precautions/Restrictions: Intact (Pt able to recall 3/3 spinal precautions.) Precaution/Restrictions Comments: Verbally reviewed spinal precautions, re-educated on acronym BLT. Required Braces or Orthoses: Spinal Brace Spinal Brace: Thoracolumbosacral orthotic, Applied in sitting position Restrictions Weight Bearing Restrictions Per Provider Order: No   Therapy/Group: Individual Therapy  Edwin Cap PT, DPT 01/14/2024, 9:21 AM

## 2024-01-14 NOTE — Progress Notes (Signed)
 Occupational Therapy Session Note  Patient Details  Name: Katelyn [name] MRN: [id number] Date of Birth: 1956-03-16  Today's Date: 01/14/2024 OT Individual Time: 0946-1100 OT Individual Time Calculation (min): 74 min    Short Term Goals: Week 1:  OT Short Term Goal 1 (Week 1): Pt will thread BLE into LB garments with Min A + LRAD. OT Short Term Goal 2 (Week 1): Pt will complete 1/3 toileting activities with Min A + LRAD. OT Short Term Goal 3 (Week 1): Pt will perform toilet transfer with consistent Mod A.  Skilled Therapeutic Interventions/Progress Updates:    Pt received sitting in the w/c with no c/o pain, agreeable to OT session. Pt was taken via w/c to the therapy gym for time management. She worked first on [employer] using the [employer] support. On level 3 she was able to lift RUE to 90 degrees in flexion and abduction, as well as worked on blocked practice horizontal abduction/adduction. She then worked on sit <> stand progressions using the stedy, to challenge generalized strengthening for ADL transfers, as well as increasing functional activity tolerance and cardiorespiratory endurance. Pt required CGA- min A overall. Blocked practice both from perch and from low mat and with BUE and then single UE support. With single UE she required more like mod A in the stedy. She then worked on static standing tolerance for carryover to LB ADL tasks, with single UE support and working on peg task. She was able to remain standing for about 2 minutes with LUE support only, gradually sinking into seated on perch. Mod cueing for encouragement/maintain arousal d/t pt very tired from pain medication. She returned to her room and with the stedy transferred to EOB. She changed her shirt with min A. She was left supine, quickly asleep, bed alarm set.   Therapy Documentation Precautions:  Precautions Precautions: Fall, Back Recall of Precautions/Restrictions: Intact (Pt able to recall 3/3 spinal  precautions.) Precaution/Restrictions Comments: Verbally reviewed spinal precautions, re-educated on acronym BLT. Required Braces or Orthoses: Spinal Brace Spinal Brace: Thoracolumbosacral orthotic, Applied in sitting position Restrictions Weight Bearing Restrictions Per Provider Order: No  Therapy/Group: Individual Therapy  Crissie Reese 01/14/2024, 6:26 AM

## 2024-01-14 NOTE — Discharge Instructions (Addendum)
 Inpatient Rehab Discharge Instructions  [name] Discharge date and time:  04/025/25  Activities/Precautions/ Functional Status: Activity: no lifting, driving, or strenuous exercise till cleared by MD Diet: diabetic diet Wound Care: keep wound clean and dry   Functional status:  ___ No restrictions     ___ Walk up steps independently ___ 24/7 supervision/assistance   ___ Walk up steps with assistance ___ Intermittent supervision/assistance  ___ Bathe/dress independently ___ Walk with walker     ___ Bathe/dress with assistance ___ Walk Independently    ___ Shower independently ___ Walk with assistance    ___ Shower with assistance ___ No alcohol     ___ Return to work/school ________  Special Instructions:    My questions have been answered and I understand these instructions. I will adhere to these goals and the provided educational materials after my discharge from the hospital.  Patient/Caregiver Signature _______________________________ Date __________  Clinician Signature _______________________________________ Date __________  Please bring this form and your medication list with you to all your follow-up doctor's appointments.

## 2024-01-14 NOTE — Care Management (Signed)
 Inpatient Rehabilitation Center Individual Statement of Services  Patient Name:  Katelyn [name]  Date:  01/14/2024  Welcome to the Inpatient Rehabilitation Center.  Our goal is to provide you with an individualized program based on your diagnosis and situation, designed to meet your specific needs.  With this comprehensive rehabilitation program, you will be expected to participate in at least 3 hours of rehabilitation therapies Monday-Friday, with modified therapy programming on the weekends.  Your rehabilitation program will include the following services:  Physical Therapy (PT), Occupational Therapy (OT), 24 hour per day rehabilitation nursing, Therapeutic Recreaction (TR), Psychology, Neuropsychology, Care Coordinator, Rehabilitation Medicine, Nutrition Services, Pharmacy Services, and Other  Weekly team conferences will be held on Tuesdays to discuss your progress.  Your Inpatient Rehabilitation Care Coordinator will talk with you frequently to get your input and to update you on team discussions.  Team conferences with you and your family in attendance may also be held.  Expected length of stay: 14-21 days  Overall anticipated outcome: Minimal Assistance  Depending on your progress and recovery, your program may change. Your Inpatient Rehabilitation Care Coordinator will coordinate services and will keep you informed of any changes. Your Inpatient Rehabilitation Care Coordinator's name and contact numbers are listed  below.  The following services may also be recommended but are not provided by the Inpatient Rehabilitation Center:  Driving Evaluations Home Health Rehabiltiation Services Outpatient Rehabilitation Services Vocational Rehabilitation   Arrangements will be made to provide these services after discharge if needed.  Arrangements include referral to agencies that provide these services.  Your insurance has been verified to be:  [employer]  Your primary doctor is:   Dina Rich  Pertinent information will be shared with your doctor and your insurance company.  Inpatient Rehabilitation Care Coordinator:  Susie Cassette [phone] or ([phone]  Information discussed with and copy given to patient by: Gretchen Short, 01/14/2024, 11:34 AM

## 2024-01-15 ENCOUNTER — Inpatient Hospital Stay (HOSPITAL_COMMUNITY)

## 2024-01-15 DIAGNOSIS — M4714 Other spondylosis with myelopathy, thoracic region: Secondary | ICD-10-CM | POA: Diagnosis not present

## 2024-01-15 LAB — GLUCOSE, CAPILLARY
Glucose-Capillary: 159 mg/dL — ABNORMAL HIGH (ref 70–99)
Glucose-Capillary: 236 mg/dL — ABNORMAL HIGH (ref 70–99)
Glucose-Capillary: 233 mg/dL — ABNORMAL HIGH (ref 70–99)
Glucose-Capillary: 173 mg/dL — ABNORMAL HIGH (ref 70–99)

## 2024-01-15 LAB — AEROBIC/ANAEROBIC CULTURE W GRAM STAIN (SURGICAL/DEEP WOUND): Gram Stain: NONE SEEN

## 2024-01-15 MED ORDER — ACETAMINOPHEN 500 MG PO TABS
500.0000 mg | ORAL_TABLET | Freq: Three times a day (TID) | ORAL | Status: DC
  Administered 2024-01-15 – 2024-01-16 (×2): 500 mg via ORAL
  Filled 2024-01-15 (×3): qty 1

## 2024-01-15 MED ORDER — SODIUM CHLORIDE 0.9 % IV SOLN: INTRAVENOUS | Status: AC | PRN

## 2024-01-15 MED ORDER — HYDROCODONE-ACETAMINOPHEN 5-325 MG PO TABS
0.5000 | ORAL_TABLET | Freq: Four times a day (QID) | ORAL | Status: DC | PRN
  Administered 2024-01-15 – 2024-01-30 (×19): 1 via ORAL
  Administered 2024-01-31: 0.5 via ORAL
  Administered 2024-02-01: 1 via ORAL
  Filled 2024-01-15 (×23): qty 1

## 2024-01-15 NOTE — Progress Notes (Signed)
 Physical Therapy Session Note  Patient Details  Name: Katelyn [name] MRN: [id number] Date of Birth: [birth year]  Today's Date: 01/15/2024 PT Individual Time: 0952-1030 + 1350-1500 PT Individual Time Calculation (min): 38 min + 70 min  Short Term Goals: Week 1:  PT Short Term Goal 1 (Week 1): pt will perform bed mobility with mod A consistantly PT Short Term Goal 2 (Week 1): pt will transfer sit<>stand with LRAD and mod A consistantly PT Short Term Goal 3 (Week 1): pt will ambulate 96ft with LRAD and max A of 1  Skilled Therapeutic Interventions/Progress Updates:    SESSION 1: Pt presents seated on BSC at scheduled appointment time, requesting time to complete BM. Therapist returns several times to check on pt with pt reporting finished at 9:52am and pt agreeable to PT. Session focused on therapeutic activities to facilitate participation with self care tasks as well as tolerance to upright, transfer training, and standing therex for BLE strengthening.  Therapist adjusts TLSO to place straps beneath axilla for improved comfort. Pt completes sit to stand with stedy with mod assist, pt maintains standing with stedy with CGA ~1 min for periarea hygiene then sits to posterior pads and maintains in high perched position for transfer to EOB. Therapist then assist with donning shirt and pants seated EOB with mod assist for upperbody dressing, total assist for managing TLSO, max assist for threading pants with pt sitting EOB. Pt able to maintain seated balance with close supervision for ~5 minutes during dressing. Pt then stands to RW with mod assist to manage pants over hips, therapist provides mod assist for stand and standing balance, 2nd person (pt husband) assist with managing pants over hips.  Pt comes to sitting for rest break then completes standing therex for BLE strengthening and muscle fiber recruitment. Therapist assists with standing balance while pt husband sits in chair in front of pt to block  knees as well as build confidence with standing. Pt demonstrates improving posture in standing with RW with pt husband seated in front of her. Pt completes standing marching 2x5 BLE. Pt demonstrating good foot clearance with BLE however demonstrating increased adductor tone.  Pt attempts to complete side stepping with RW with heavy mod assist with posterior lean, able to manage x2 steps before requiring sit to bed. Pt requires assist +2 for sit to supine for truk and LE management. Pt remains supine with all needs within reach call light in place, RN notified of pt position.    SESSION 2: Pt presents in room in [facility], agreeable to PT. Pt reporting 4/10 pain in flank and fatigue from previous session. Session focused on gait training, therapeutic exercise for BLE strengthening and activity tolerance, and transfer training and bed mobility training.  Pt transported to main gym dependently for time management and energy conservation. Pt positioned in //bars. Pt completes sit<>stand in //bars with light mod assist, ambulates 3x8' gait in //bars with mod assist for postural stability and blocking bilateral knees (no buckling noted until 3rd trial when fatigue was evident), +2 for WC follow. Pt demonstrating scissoring bilaterally, able to correct slightly with cueing.  Pt then transported to day room dependently for time management and positioned with BLEs on kinetron. Pt completes seated therex interval training on kinetron 30 seconds work/30 second rest 10 minutes total with pt demonstrating good initiation but poor endurance with task requiring min assist on kinetron to maintain momentum and range of motion.  Pt then completes seated therex for BLE strengthening including  seated marches x10 BLE (AAROM for full ROM), and heel slides x10 BLE (AAROM RLE for full ROM).  Pt provided with frequent rest breaks throughout session, during rest breaks pt educated on injury as well as muscle tone and rehabilitation  to assist with current symptoms with pt verbalizing understanding.  Pt transported back to room and transferred back to bed with stedy with light mod assist. Pt stands to standing scale, maintains standing with supervision, BUE support on hand rail, for weight assessment per pt request. Pt returns to bed with max assist x2, requires +2 assist for boosting up in bed. Pt then completes supine therex for BLE strengthening including ankle pumps x20 and supine marching x5 BLE (AAROM) with education on swelling management for BLEs. Pt remains in supine with all needs wihtin reach, call ight in place, and bed alarm activated, RN present to administer pain medication at end of session.   Therapy Documentation Precautions:  Precautions Precautions: Fall, Back Recall of Precautions/Restrictions: Intact (Pt able to recall 3/3 spinal precautions.) Precaution/Restrictions Comments: Verbally reviewed spinal precautions, re-educated on acronym BLT. Required Braces or Orthoses: Spinal Brace Spinal Brace: Thoracolumbosacral orthotic, Applied in sitting position Restrictions Weight Bearing Restrictions Per Provider Order: No General: PT Amount of Missed Time (min): 37 Minutes PT Missed Treatment Reason: Other (Comment) (Pt requesting to use bathroom)   Therapy/Group: Individual Therapy  Edwin Cap PT, DPT 01/15/2024, 3:43 PM

## 2024-01-15 NOTE — Plan of Care (Signed)
  Problem: Consults Goal: Diabetes Guidelines if Diabetic/Glucose > 140 Description: If diabetic or lab glucose is > 140 mg/dl - Initiate Diabetes/Hyperglycemia Guidelines & Document Interventions  Outcome: Progressing   Problem: SCI BLADDER ELIMINATION Goal: RH STG MANAGE BLADDER WITH ASSISTANCE Description: STG Manage Bladder With minimal Assistance Outcome: Progressing   Problem: RH SAFETY Goal: RH STG ADHERE TO SAFETY PRECAUTIONS W/ASSISTANCE/DEVICE Description: STG Adhere to Safety Precautions With minimal Assistance/Device. Outcome: Progressing

## 2024-01-15 NOTE — Progress Notes (Signed)
 PROGRESS NOTE   Subjective/Complaints: Patient having intermittent confusion, was worse yesterday morning and improved in the afternoon per therapies.  On a.m. evaluation, had a single staring spell where she seemed to lose track of conversation for few seconds, self resolved.  She states overall, she feels she is having more difficulty concentrating usual.  Denying any new motor or sensory deficits.  Has been sleeping well.  Otherwise, is having minimal pain at the surgical site.  Did discuss at team's help patient has good strength in her lower extremities but significant fear of falling; discussed with patient this is the safest place to try to walk and stand and encouraged her to move with therapies, she is agreeable to this.  Last bowel movement this a.m.  ROS: as per HPI. Denies CP, SOB, abd pain, N/V/D/C, or any other complaints at this time.   + confusion + itching  Objective:   No results found.  Recent Labs    01/14/24 0435  WBC 6.3  HGB 9.2*  HCT 28.3*  PLT 97*   Recent Labs    01/14/24 0435  NA 133*  K 4.3  CL 95*  CO2 31  GLUCOSE 183*  BUN 35*  CREATININE 1.48*  CALCIUM 8.2*      Intake/Output Summary (Last 24 hours) at 01/15/2024 1055 Last data filed at 01/14/2024 1900 Gross per 24 hour  Intake 356 ml  Output --  Net 356 ml        Physical Exam: Vital Signs Blood pressure (!) 140/59, pulse 92, temperature 98.2 F (36.8 C), resp. rate 18, height 5\' 1"  (1.549 m), weight 87.3 kg, SpO2 98%.  Constitutional: No apparent distress. Appropriate appearance for age. +Obese.  Sitting up in bed. HENT: No JVD. Neck Supple. Trachea midline.  Lip swelling and vesicular rash almost completely resolved. Eyes: PERRLA. EOMI. Cardiovascular: RRR, no murmurs/rub/gallops.  Trace bilateral LE Edema. Peripheral pulses 2+  Respiratory: CTAB. No rales, rhonchi, or wheezing. On RA.  Abdomen: + bowel sounds,  normoactive. No distention or tenderness.  Skin: PICC line with minimal bruising, intact. wound vac well adhered to back incision, no active drainage.    MSK:      No apparent deformity. + RUE shoulder abduction/flexion limited to <50 degrees--not reassessed        Neurologic exam:  Cognition: AAO to person, place, time and event.  Language: Fluent, No substitutions or neoglisms. No dysarthria. Names 3/3 objects correctly.  Memory: Recalls 3/3 objects at 5 minutes. No apparent deficits  Insight: Good insight into current condition.  Mood: Pleasant affect, appropriate mood. Slightly tearful.  Sensation: To light touch reduced in distal fingertips and toes bilaterally; reduced on bilateral medial knees and R lateral knee--unchanged   Reflexes: 2+ in BL UE; hyporeflexic BL LE. No babinski. CN: 2-12 grossly intact.  Coordination: No apparent tremors. No ataxia on FTN bilaterally.  Spasticity: MAS 0 in all extremities.       Strength:                RUE: 4-/5 SA, 5/5 EF, 5/5 EE, 5/5 WE, 5/5 FF, 5/5 FA  LUE:  5/5 SA, 5/5 EF, 5/5 EE, 5/5 WE, 5/5 FF, 5/5 FA                RLE: 2+/5 HF, 4/5 KE, 3-/5  DF, 2/5  EHL, 3/5  PF                 LLE:  3-/5 HF, 4-/5 KE, 5-/5  DF, 5-/5  EHL, 5-/5  PF  No significant changes 4-8  Assessment/Plan: 1. Functional deficits which require 3+ hours per day of interdisciplinary therapy in a comprehensive inpatient rehab setting. Physiatrist is providing close team supervision and 24 hour management of active medical problems listed below. Physiatrist and rehab team continue to assess barriers to discharge/monitor patient progress toward functional and medical goals  Care Tool:  Bathing    Body parts bathed by patient: Right arm, Chest, Abdomen, Right upper leg, Left upper leg, Face   Body parts bathed by helper: Left arm, Front perineal area, Buttocks, Right lower leg, Left lower leg     Bathing assist Assist Level: Moderate Assistance -  Patient 50 - 74%     Upper Body Dressing/Undressing Upper body dressing   What is the patient wearing?: Button up shirt    Upper body assist Assist Level: Moderate Assistance - Patient 50 - 74%    Lower Body Dressing/Undressing Lower body dressing      What is the patient wearing?: Pants     Lower body assist Assist for lower body dressing: Total Assistance - Patient < 25%     Toileting Toileting    Toileting assist Assist for toileting: 2 Helpers     Transfers Chair/bed transfer  Transfers assist     Chair/bed transfer assist level: Dependent - Patient 0% (Stedy)     Locomotion Ambulation   Ambulation assist   Ambulation activity did not occur: Safety/medical concerns (weakness, pain, decreased balance)          Walk 10 feet activity   Assist  Walk 10 feet activity did not occur: Safety/medical concerns (weakness, pain, decreased balance)        Walk 50 feet activity   Assist Walk 50 feet with 2 turns activity did not occur: Safety/medical concerns (weakness, pain, decreased balance)         Walk 150 feet activity   Assist Walk 150 feet activity did not occur: Safety/medical concerns (weakness, pain, decreased balance)         Walk 10 feet on uneven surface  activity   Assist Walk 10 feet on uneven surfaces activity did not occur: Safety/medical concerns (weakness, pain, decreased balance)         Wheelchair     Assist Is the patient using a wheelchair?: Yes Type of Wheelchair: Manual    Wheelchair assist level: Dependent - Patient 0% Max wheelchair distance: >138ft    Wheelchair 50 feet with 2 turns activity    Assist        Assist Level: Dependent - Patient 0%   Wheelchair 150 feet activity     Assist      Assist Level: Dependent - Patient 0%   Blood pressure (!) 140/59, pulse 92, temperature 98.2 F (36.8 C), resp. rate 18, height 5\' 1"  (1.549 m), weight 87.3 kg, SpO2 98%.  Medical Problem  List and Plan: 1. Functional deficits secondary to  T10 spinal stenosis with myelopathy s/p T7-L2 fusion and replacement of T10, L1, and T12 screws              -  patient may not shower until wound vac is removed              -ELOS/Goals: 9-12 days, SPV PT/OT - 4/25 DC date             -Continue CIR   - 4/8: Max a bed mobility, STEDY - very fearful of falling. She did some marching at EOB today with the walker. Min A short distances goals. Mod A UB Max A LBD and toiletting; really limited by pain and ROM in her right arm.    2.  Antithrombotics: -DVT/anticoagulation:  Mechanical: Sequential compression devices, below knee Bilateral lower extremities  -01/13/24 DVT US neg              -antiplatelet therapy: N/A 3. Acute on chronic pain/Pain Management:  Was on Oxycodone 5 mg and Lyrica 75 mg daily PTA.  --Having surgical pain--continue Oxycodone prn. Pain worse at nights-->will schedule oxycodone at bedtime.  -Voltaren gel QID -4/7: DC morphine due to no utilization - 4/8: Oxy scheduled overnight and when taken in the a.m. causing some lethargy intermittently--patient has tolerated hydrocodone in the past, will switch to Norco 2.5 to 5 mg every 6 hours as needed.  Will also schedule Tylenol 500 mg 3 times daily.  4. Mood/Behavior/Sleep: LCSW to folow for evaluation and support.              -antipsychotic agents: N/A  -Cymbalta 60mg  nightly, mirapex 1mg  at bedtime, mirtazapine 30mg  nightly  -Patient reports sleeping well  5. Neuropsych/cognition: This patient is capable of making decisions on his own behalf.  4/8: Having increased confusion, staring episodes without new neurologic deficits.  Adjusting pain medications as above.  Has had issues with hallucinations/confusion with cefepime in the past, would consider antibiotic adjustment if alternative causes ruled out.  6. Skin/Wound Care: Routine pressure relief measures. Monitor incision for healing.   4/7: Neurosurgery removed Hemovac over  the weekend, current VAC with no drainage, if no further output in 24 hours will discuss removal with Dr. Delorise Shiner removal in 2 days per Dr. Yetta Barre  7. Fluids/Electrolytes/Nutrition: Monitor I/O. Routine labs  -01/12/24 CMP stable today, Cr down to 1.55, AST 44, improving, monitor  4-7: LFTs mildly up trending, BUN and creatinine down, mild hyponatremia.  Limit Tylenol to 3000 mg daily as above.  Repeat in 2 to 3 days.  8. Neuropathy/band like pain around waist: Will increase Lyrica to TID 9. Osteomyelitis/discitis. Pending PICC placement. -- Starting 6 weeks of daptomycin and ertapenem for 6 weeks through 02/20/24 to be followed by PO suppression doxycycline and ciprofloxacin.  -01/13/24 Cx NGTD x4d 4-7: ESR added on to a.m. labs.  CRP up today; discussed with ID, no new orders.  10. HTN:  Monitor BP TID--on torsemide 40mg  daily and coreg 6.125mg  BID (not reordered?). -4/5-6/25 BPs mostly fine, coreg not ordered as of now; remain off for now 4-7: Systolic low, p.o. intakes minimal.  Reduce torsemide for tomorrow a.m. to 20 mg, and encourage p.o. fluids today. 4-8: Diastolic remains low, will DC torsemide.  Schedule daily weights.  Vitals:   01/11/24 1850 01/11/24 2258 01/12/24 0423 01/12/24 1355  BP: (!) 118/50 (!) 118/50 (!) 122/56 (!) 149/62   01/12/24 2029 01/13/24 0552 01/13/24 1426 01/13/24 1958  BP: (!) 151/58 (!) 117/56 (!) 134/48 (!) 131/57   01/14/24 0505 01/14/24 1427 01/15/24 0429  BP: (!) 129/55 (!) 125/56 (!) 140/59    11. T2DM: Hgb A1C-7.9. Used [name] 50-120/ 5 units ac meal TID  at home.              --Continue insulin glargine 20 units daily with SSI.  -4/5-6/25 CBGs a bit better this weekend, monitor trend but may need further adjustments since they're still mostly >200 4/7: Blood sugars remain elevated, increase glargine to 25 units daily starting tomorrow a.m. CBG (last 3)  Recent Labs    01/14/24 1654 01/14/24 2106 01/15/24 0608  GLUCAP 253* 176* 159*    12.   Acute on chronic renal failure: SCr has had rise to 1.56-->will d/c celebrex.  -01/12/24 Cr 1.55 today, down from 1.86 yesterday, monitor Monday  4-7: BUN, creatinine improving.  13. Fever blisters: Will start valtrex 1g BID x1wk as symptomatic. Improving, EOT 4/11 14. Right shoulder pain: Question strain but heard a pop this am and extremely concerned that she may have torn her RTC again --Voltaren gel for local measures.  -- Consider MRI if no improvement 4/8: R shoulder remains very painful/limited - will get x-ray and consider MRI if no acute findings.   15. Constipation: Continue Senna 2 tabs daily--miralax was added on 04/04 -01/12/24 LBM yesterday, didn't get the 32oz miralax so d/c'd. Continue miralax 17g daily for now.  -01/13/24 LBM last night, cont regimen as is for now, adjust accordingly 4-7: Increase Senokot-S to 2 tabs twice daily, MiraLAX BID 4/8: large BM   16. Depression/Anxiety: Continue Cymbalta 60mg  nightly and Mirtazapine 30mg  nightly.   17. Neurogenic bladder: Continue foley. D/c in am if has results with laxative tonight. -01/12/24 had BM last night, will pull foley, do timed toileting and PVRs q4-6h, ISC if PVR >386ml -01/13/24 urinating well, low PVRs, continue checking but could d/c tomorrow if still urinating fine.  4-7: Single episode of incontinence overnight, with PVR 170s.  Otherwise continent.  Continue current regimen.  18. ABLA: Hgb 10.5 01/12/24, monitor  -4-7: Hemoglobin down from 10.5-9.4; no overt signs of bleeding, repeat in 1 to 2 days  19. GERD: reordered Protonix 40mg  daily  20.  Transaminitis.  Uptrending on 4-7 labs.  -May be stasis related due to poor mobility; Tylenol utilization within parameters  -Repeat in 2 days  LOS: 4 days A FACE TO FACE EVALUATION WAS PERFORMED  [name] 01/15/2024, 10:55 AM

## 2024-01-15 NOTE — Progress Notes (Signed)
 Met with patient to review current situation, team conference and plan of care. Patient c/o itching around incision site. No redness noted, Neurosurgery MD came to visit patient, suggest  cortisone cream to help with  itching. Wound vac per MD will be d/c after 2 days. Reviewed pain,sleep, bowel and bladder. Continue to follow along to provide educational needs to facilitate preparation for discharge.

## 2024-01-15 NOTE — Plan of Care (Signed)
  Problem: Consults Goal: RH SPINAL CORD INJURY PATIENT EDUCATION Description:  See Patient Education module for education specifics.  Outcome: Progressing Goal: Skin Care Protocol Initiated - if Braden Score 18 or less Description: If consults are not indicated, leave blank or document N/A Outcome: Progressing Goal: Nutrition Consult-if indicated Outcome: Progressing Goal: Diabetes Guidelines if Diabetic/Glucose > 140 Description: If diabetic or lab glucose is > 140 mg/dl - Initiate Diabetes/Hyperglycemia Guidelines & Document Interventions  Outcome: Progressing   Problem: SCI BOWEL ELIMINATION Goal: RH STG MANAGE BOWEL WITH ASSISTANCE Description: STG Manage Bowel with minimal  Assistance. Outcome: Progressing Goal: RH STG SCI MANAGE BOWEL WITH MEDICATION WITH ASSISTANCE Description: STG SCI Manage bowel with medication with minimal assistance. Outcome: Progressing   Problem: SCI BLADDER ELIMINATION Goal: RH STG MANAGE BLADDER WITH ASSISTANCE Description: STG Manage Bladder With minimal Assistance Outcome: Progressing Goal: RH STG MANAGE BLADDER WITH MEDICATION WITH ASSISTANCE Description: STG Manage Bladder With Medication With minimal Assistance. Outcome: Progressing   Problem: RH SKIN INTEGRITY Goal: RH STG SKIN FREE OF INFECTION/BREAKDOWN Outcome: Progressing Goal: RH STG MAINTAIN SKIN INTEGRITY WITH ASSISTANCE Description: STG Maintain Skin Integrity With Assistance. Outcome: Progressing Goal: RH STG ABLE TO PERFORM INCISION/WOUND CARE W/ASSISTANCE Description: STG Able To Perform Incision/Wound Care With Assistance. Outcome: Progressing   Problem: RH SAFETY Goal: RH STG ADHERE TO SAFETY PRECAUTIONS W/ASSISTANCE/DEVICE Description: STG Adhere to Safety Precautions With minimal Assistance/Device. Outcome: Progressing Goal: RH STG DECREASED RISK OF FALL WITH ASSISTANCE Description: STG Decreased Risk of Fall With minimal Assistance. Outcome: Progressing   Problem:  RH PAIN MANAGEMENT Goal: RH STG PAIN MANAGED AT OR BELOW PT'S PAIN GOAL Description: <4 w/ prns Outcome: Progressing   Problem: RH KNOWLEDGE DEFICIT SCI Goal: RH STG INCREASE KNOWLEDGE OF SELF CARE AFTER SCI Outcome: Progressing   Problem: Consults Goal: RH SPINAL CORD INJURY PATIENT EDUCATION Description:  See Patient Education module for education specifics.  Outcome: Progressing   Problem: SCI BOWEL ELIMINATION Goal: RH STG MANAGE BOWEL WITH ASSISTANCE Description: STG Manage Bowel with Assistance. Outcome: Progressing   Problem: SCI BLADDER ELIMINATION Goal: RH STG MANAGE BLADDER WITH ASSISTANCE Description: STG Manage Bladder With Assistance Outcome: Progressing   Problem: RH SKIN INTEGRITY Goal: RH STG SKIN FREE OF INFECTION/BREAKDOWN Outcome: Progressing   Problem: RH SAFETY Goal: RH STG ADHERE TO SAFETY PRECAUTIONS W/ASSISTANCE/DEVICE Description: STG Adhere to Safety Precautions With minimal  Assistance/Device. Outcome: Progressing   Problem: RH PAIN MANAGEMENT Goal: RH STG PAIN MANAGED AT OR BELOW PT'S PAIN GOAL Outcome: Progressing   Problem: RH KNOWLEDGE DEFICIT SCI Goal: RH STG INCREASE KNOWLEDGE OF SELF CARE AFTER SCI Description: Manage increase knowledge of self care \\after  SCI  SCI with minimal assistance  using educational materials provided Outcome: Progressing

## 2024-01-15 NOTE — Patient Care Conference (Signed)
 Inpatient RehabilitationTeam Conference and Plan of Care Update Date: 01/15/2024   Time: 1054 am    Patient Name: Katelyn [name]      Medical Record Number: [id number]  Date of Birth: [birth year] Sex: Female         Room/Bed: 4U98J/1B14N-82 Payor Info: Payor: [employer] / Plan: [employer] PPO / Product Type: *No Product type* /    Admit Date/Time:  01/11/2024  6:53 PM  Primary Diagnosis:  Thoracic myelopathy  Hospital Problems: Principal Problem:   Thoracic myelopathy    Expected Discharge Date: Expected Discharge Date: 02/01/24  Team Members Present: Physician leading conference: Dr. Elijah Birk Social Worker Present: Cecile Sheerer, LCSWA Nurse Present: Konrad Dolores, RN PT Present: Darrold Span, PT OT Present: Lou Cal, OT SLP Present: Other (comment) Alvera Novel SLP) PPS Coordinator present : Fae Pippin, SLP     Current Status/Progress Goal Weekly Team Focus  Bowel/Bladder      Continent of bowel and bladder    Remain continent of bowel and bladder   Remain continent of bowel and bladder   Swallow/Nutrition/ Hydration               ADL's   Mod A for UB ADLs, Max-Total A for LB ADLs & toileting. Barriers: Decreased BUE/BLE strength (previous injury to RUE preventing full ROM), decreased sitting/standing balance, questionable cognition.   Min A overall   AE training as appropriate, functional transfers, generalized strengthening, caregiver education    Mobility   bed mobility max assist, transfers via stedy with mod assist, unable to assess gait   min assist, supervision WC  barriers: pain, anxiety, strength; focus on transfer training, WC mobility, strengthening, gait training    Communication                Safety/Cognition/ Behavioral Observations               Pain      Back pain     Assess pain q shift     <4 w/ prns  Skin      Back incision w/ portable wound vac x 2 days ; CDI  Assess incision site q  shift     Skin remain free from infection    Discharge Planning:  Pt will dc/ to home with her husband who will be primary cargeiver. Pt needs to be as independent as possible as he is not physically able to assist. SW will confirm there are no barriers to discharge.    Team Discussion: Patient was admitted post T7-L2 fusion and replacement of T10, L1, and T12 screws due to T10 spinal stenosis with myelopathy. Patient has osteomyelitis /discitis on IV antibiotics. Patient has surgical pain, intermittent  confusion, elevated blood sugars:medications adjusted by MD. Patient limited by anxiety, right shoulder pain and decreased strength lower extremities.  Patient on target to meet rehab goals: yes, patient requires Mod A for UB ADLs, Max-Total A for LB ADLs and mod assist with transfers using a  stedy. Overall goals are set for min assist and supervision wheelchair level at discharge.  *See Care Plan and progress notes for long and short-term goals.   Revisions to Treatment Plan:  N/a    Teaching Needs: Safety, medications, transfers, toileting, incision site care, diabetic management education, etc.    Current Barriers to Discharge: Decreased caregiver support and IV antibiotics  Possible Resolutions to Barriers: Family education     Medical Summary Current Status: medically complicated by poor pain control, constipation,  wound vac, anxiety, lip blisters, itching, anemia, transaminitis and diabetes  Barriers to Discharge: Behavior/Mood;Complicated Wound;Electrolyte abnormality;Medical stability;Morbid Obesity;Neurogenic Bowel & Bladder;Self-care education;Uncontrolled Pain   Possible Resolutions to [employer]: titrate pain medications to minimum tolerated doses for function, titrate bowel medications, monitor vitals and increase BP regimen as approrpiate, monitor labs   Continued Need for Acute Rehabilitation Level of Care: The patient requires daily medical management  by a physician with specialized training in physical medicine and rehabilitation for the following reasons: Direction of a multidisciplinary physical rehabilitation program to maximize functional independence : Yes Medical management of patient stability for increased activity during participation in an intensive rehabilitation regime.: Yes Analysis of laboratory values and/or radiology reports with any subsequent need for medication adjustment and/or medical intervention. : Yes   I attest that I was present, lead the team conference, and concur with the assessment and plan of the team.   Konrad Dolores  01/16/2024, 1054 am

## 2024-01-15 NOTE — Progress Notes (Signed)
 Patient ID: Katelyn [name], female   DOB: 08-21-1956, 68 y.o.   MRN: [id number] She looks better today.  She is pleasant and conversant and seems to be in less pain.  Her neurologic exam is fairly stable with 2 out of 5 hip flexor strength, 4+ out of 5 knee extensor strength bilaterally, 4+ out of 5 left dorsiflexion, he states she stood at the sink and brushed her teeth.  Still has about 2 days left on the negative pressure external wound VAC

## 2024-01-15 NOTE — Progress Notes (Signed)
 Patient ID: [name], female   DOB: September 23, 1956, 68 y.o.   MRN: [id number]  SW met with pt and pt husband in room to provide updates from team conference, and d/c date 4/25. SW will continue to provide updates.   Cecile Sheerer, MSW, LCSW Office: [phone] Cell: [phone] Fax: [phone]

## 2024-01-15 NOTE — Progress Notes (Signed)
 Occupational Therapy Session Note  Patient Details  Name: Katelyn [name] MRN: [id number] Date of Birth: 08-Apr-1956  {CHL IP REHAB OT TIME CALCULATIONS:304400400}   Short Term Goals: Week 1:  OT Short Term Goal 1 (Week 1): Pt will thread BLE into LB garments with Min A + LRAD. OT Short Term Goal 2 (Week 1): Pt will complete 1/3 toileting activities with Min A + LRAD. OT Short Term Goal 3 (Week 1): Pt will perform toilet transfer with consistent Mod A.  Skilled Therapeutic Interventions/Progress Updates:      Therapy Documentation Precautions:  Precautions Precautions: Fall, Back Recall of Precautions/Restrictions: Intact (Pt able to recall 3/3 spinal precautions.) Precaution/Restrictions Comments: Verbally reviewed spinal precautions, re-educated on acronym BLT. Required Braces or Orthoses: Spinal Brace Spinal Brace: Thoracolumbosacral orthotic, Applied in sitting position Restrictions Weight Bearing Restrictions Per Provider Order: No   Therapy/Group: Individual Therapy  Lou Cal, OTR/L, MSOT  01/15/2024, 8:01 PM

## 2024-01-16 ENCOUNTER — Inpatient Hospital Stay (HOSPITAL_COMMUNITY)

## 2024-01-16 DIAGNOSIS — M4714 Other spondylosis with myelopathy, thoracic region: Secondary | ICD-10-CM | POA: Diagnosis not present

## 2024-01-16 LAB — COMPREHENSIVE METABOLIC PANEL WITH GFR
ALT: 41 U/L (ref 0–44)
AST: 56 U/L — ABNORMAL HIGH (ref 15–41)
Albumin: 2.1 g/dL — ABNORMAL LOW (ref 3.5–5.0)
Alkaline Phosphatase: 259 U/L — ABNORMAL HIGH (ref 38–126)
Anion gap: 8 (ref 5–15)
BUN: 29 mg/dL — ABNORMAL HIGH (ref 8–23)
CO2: 29 mmol/L (ref 22–32)
Calcium: 8.2 mg/dL — ABNORMAL LOW (ref 8.9–10.3)
Chloride: 94 mmol/L — ABNORMAL LOW (ref 98–111)
Creatinine, Ser: 1.18 mg/dL — ABNORMAL HIGH (ref 0.44–1.00)
GFR, Estimated: 51 mL/min — ABNORMAL LOW (ref >60)
Glucose, Bld: 169 mg/dL — ABNORMAL HIGH (ref 70–99)
Potassium: 4 mmol/L (ref 3.5–5.1)
Sodium: 131 mmol/L — ABNORMAL LOW (ref 135–145)
Total Bilirubin: 0.6 mg/dL (ref 0.0–1.2)
Total Protein: 5.1 g/dL — ABNORMAL LOW (ref 6.5–8.1)

## 2024-01-16 LAB — GLUCOSE, CAPILLARY
Glucose-Capillary: 164 mg/dL — ABNORMAL HIGH (ref 70–99)
Glucose-Capillary: 287 mg/dL — ABNORMAL HIGH (ref 70–99)
Glucose-Capillary: 257 mg/dL — ABNORMAL HIGH (ref 70–99)
Glucose-Capillary: 190 mg/dL — ABNORMAL HIGH (ref 70–99)

## 2024-01-16 LAB — SODIUM, URINE, RANDOM: Sodium, Ur: 50 mmol/L

## 2024-01-16 LAB — GAMMA GT: GGT: 207 U/L — ABNORMAL HIGH (ref 7–50)

## 2024-01-16 LAB — OSMOLALITY, URINE: Osmolality, Ur: 455 mosm/kg (ref 300–900)

## 2024-01-16 LAB — OSMOLALITY: Osmolality: 282 mosm/kg (ref 275–295)

## 2024-01-16 NOTE — Progress Notes (Signed)
 Occupational Therapy Session Note  Patient Details  Name: Katelyn [name] MRN: [id number] Date of Birth: [birth year]  {CHL IP REHAB OT TIME CALCULATIONS:304400400}  {CHL IP REHAB OT TIME CALCULATIONS:304400400}  Short Term Goals: Week 1:  OT Short Term Goal 1 (Week 1): Pt will thread BLE into LB garments with Min A + LRAD. OT Short Term Goal 2 (Week 1): Pt will complete 1/3 toileting activities with Min A + LRAD. OT Short Term Goal 3 (Week 1): Pt will perform toilet transfer with consistent Mod A.  Skilled Therapeutic Interventions/Progress Updates:   Session 1:   Session 2:   Therapy Documentation Precautions:  Precautions Precautions: Fall, Back Recall of Precautions/Restrictions: Intact (Pt able to recall 3/3 spinal precautions.) Precaution/Restrictions Comments: Verbally reviewed spinal precautions, re-educated on acronym BLT. Required Braces or Orthoses: Spinal Brace Spinal Brace: Thoracolumbosacral orthotic, Applied in sitting position Restrictions Weight Bearing Restrictions Per Provider Order: No   Therapy/Group: Individual Therapy  Lou Cal, OTR/L, MSOT  01/16/2024, 9:38 PM

## 2024-01-16 NOTE — Progress Notes (Signed)
 PROGRESS NOTE   Subjective/Complaints:  LFTS continue uptrending; Na downtrending BUN/Cr improved BP stable, diastolic remains soft. Afberile. Shoulder xray yesterday with mild degenerative changes only 6/10 back pain opvernight No further reported confusion   ROS: as per HPI. Denies CP, SOB, abd pain, N/V/D/C, or any other complaints at this time.   + confusion + itching  Objective:   DG Shoulder Right Result Date: 01/15/2024 CLINICAL DATA:  Acute pain of right shoulder due to trauma. EXAM: RIGHT SHOULDER - 2+ VIEW COMPARISON:  None Available. FINDINGS: There is no evidence of fracture or dislocation. Mild degenerative change. Subcortical cystic change in the lateral humeral head. Soft tissues are unremarkable. Right upper extremity PICC is partially included in the field of view. IMPRESSION: 1. No fracture or dislocation of the right shoulder. 2. Mild degenerative change. Electronically Signed   By: Narda Rutherford M.D.   On: 01/15/2024 18:57    Recent Labs    01/14/24 0435  WBC 6.3  HGB 9.2*  HCT 28.3*  PLT 97*   Recent Labs    01/14/24 0435 01/16/24 0320  NA 133* 131*  K 4.3 4.0  CL 95* 94*  CO2 31 29  GLUCOSE 183* 169*  BUN 35* 29*  CREATININE 1.48* 1.18*  CALCIUM 8.2* 8.2*      Intake/Output Summary (Last 24 hours) at 01/16/2024 0956 Last data filed at 01/16/2024 0801 Gross per 24 hour  Intake 559.61 ml  Output --  Net 559.61 ml        Physical Exam: Vital Signs Blood pressure (!) 158/84, pulse 96, temperature 98.4 F (36.9 C), resp. rate 17, height 5\' 1"  (1.549 m), weight 88 kg, SpO2 100%.  Constitutional: No apparent distress. Appropriate appearance for age. +Obese.  Sitting up in bed. HENT: No JVD. Neck Supple. Trachea midline.  Lip swelling and vesicular rash almost completely resolved. Eyes: PERRLA. EOMI. Cardiovascular: RRR, no murmurs/rub/gallops.  Trace bilateral LE Edema. Peripheral  pulses 2+  Respiratory: CTAB. No rales, rhonchi, or wheezing. On RA.  Abdomen: + bowel sounds, normoactive. No distention or tenderness.  Skin: PICC line with minimal bruising, intact. wound vac well adhered to back incision, no active drainage.    MSK:      No apparent deformity. + RUE shoulder abduction/flexion limited to <50 degrees--not reassessed        Neurologic exam:  Cognition: AAO to person, place, time and event.  Language: Fluent, No substitutions or neoglisms. No dysarthria. Names 3/3 objects correctly.  Memory: Recalls 3/3 objects at 5 minutes. No apparent deficits  Insight: Good insight into current condition.  Mood: Pleasant affect, appropriate mood. Slightly tearful.  Sensation: To light touch reduced in distal fingertips and toes bilaterally; reduced on bilateral medial knees and R lateral knee--unchanged   Reflexes: 2+ in BL UE; hyporeflexic BL LE. No babinski. CN: 2-12 grossly intact.  Coordination: No apparent tremors. No ataxia on FTN bilaterally.  Spasticity: MAS 0 in all extremities.       Strength:                RUE: 4-/5 SA, 5/5 EF, 5/5 EE, 5/5 WE, 5/5 FF, 5/5 FA  LUE:  5/5 SA, 5/5 EF, 5/5 EE, 5/5 WE, 5/5 FF, 5/5 FA                RLE: 2+/5 HF, 4/5 KE, 3-/5  DF, 2/5  EHL, 3/5  PF                 LLE:  3-/5 HF, 4-/5 KE, 5-/5  DF, 5-/5  EHL, 5-/5  PF  No significant changes 4-8  Assessment/Plan: 1. Functional deficits which require 3+ hours per day of interdisciplinary therapy in a comprehensive inpatient rehab setting. Physiatrist is providing close team supervision and 24 hour management of active medical problems listed below. Physiatrist and rehab team continue to assess barriers to discharge/monitor patient progress toward functional and medical goals  Care Tool:  Bathing    Body parts bathed by patient: Right arm, Chest, Abdomen, Right upper leg, Left upper leg, Face   Body parts bathed by helper: Left arm, Front perineal area,  Buttocks, Right lower leg, Left lower leg     Bathing assist Assist Level: Moderate Assistance - Patient 50 - 74%     Upper Body Dressing/Undressing Upper body dressing   What is the patient wearing?: Button up shirt    Upper body assist Assist Level: Moderate Assistance - Patient 50 - 74%    Lower Body Dressing/Undressing Lower body dressing      What is the patient wearing?: Pants     Lower body assist Assist for lower body dressing: Total Assistance - Patient < 25%     Toileting Toileting    Toileting assist Assist for toileting: 2 Helpers     Transfers Chair/bed transfer  Transfers assist     Chair/bed transfer assist level: Dependent - Patient 0% (Stedy)     Locomotion Ambulation   Ambulation assist   Ambulation activity did not occur: Safety/medical concerns (weakness, pain, decreased balance)          Walk 10 feet activity   Assist  Walk 10 feet activity did not occur: Safety/medical concerns (weakness, pain, decreased balance)        Walk 50 feet activity   Assist Walk 50 feet with 2 turns activity did not occur: Safety/medical concerns (weakness, pain, decreased balance)         Walk 150 feet activity   Assist Walk 150 feet activity did not occur: Safety/medical concerns (weakness, pain, decreased balance)         Walk 10 feet on uneven surface  activity   Assist Walk 10 feet on uneven surfaces activity did not occur: Safety/medical concerns (weakness, pain, decreased balance)         Wheelchair     Assist Is the patient using a wheelchair?: Yes Type of Wheelchair: Manual    Wheelchair assist level: Dependent - Patient 0% Max wheelchair distance: >172ft    Wheelchair 50 feet with 2 turns activity    Assist        Assist Level: Dependent - Patient 0%   Wheelchair 150 feet activity     Assist      Assist Level: Dependent - Patient 0%   Blood pressure (!) 158/84, pulse 96, temperature 98.4  F (36.9 C), resp. rate 17, height 5\' 1"  (1.549 m), weight 88 kg, SpO2 100%.  Medical Problem List and Plan: 1. Functional deficits secondary to  T10 spinal stenosis with myelopathy s/p T7-L2 fusion and replacement of T10, L1, and T12 screws              -  patient may not shower until wound vac is removed              -ELOS/Goals: 9-12 days, SPV PT/OT - 4/25 DC date             -Continue CIR   - 4/8: Max a bed mobility, STEDY - very fearful of falling. She did some marching at EOB today with the walker. Min A short distances goals. Mod A UB Max A LBD and toiletting; really limited by pain and ROM in her right arm.    2.  Antithrombotics: -DVT/anticoagulation:  Mechanical: Sequential compression devices, below knee Bilateral lower extremities  -01/13/24 DVT US neg              -antiplatelet therapy: N/A 3. Acute on chronic pain/Pain Management:  Was on Oxycodone 5 mg and Lyrica 75 mg daily PTA.  --Having surgical pain--continue Oxycodone prn. Pain worse at nights-->will schedule oxycodone at bedtime.  -Voltaren gel QID -4/7: DC morphine due to no utilization - 4/8: Oxy scheduled overnight and when taken in the a.m. causing some lethargy intermittently--patient has tolerated hydrocodone in the past, will switch to Norco 2.5 to 5 mg every 6 hours as needed.  Will also schedule Tylenol 500 mg 3 times daily.  4. Mood/Behavior/Sleep: LCSW to folow for evaluation and support.              -antipsychotic agents: N/A  -Cymbalta 60mg  nightly, mirapex 1mg  at bedtime, mirtazapine 30mg  nightly  -Patient reports sleeping well  5. Neuropsych/cognition: This patient is capable of making decisions on his own behalf.  4/8: Having increased confusion, staring episodes without new neurologic deficits.  Adjusting pain medications as above.  Has had issues with hallucinations/confusion with cefepime in the past, would consider antibiotic adjustment if alternative causes ruled out.  6. Skin/Wound Care: Routine  pressure relief measures. Monitor incision for healing.   4/7: Neurosurgery removed Hemovac over the weekend, current VAC with no drainage, if no further output in 24 hours will discuss removal with Dr. Delorise Shiner removal in 2 days per Dr. Yetta Barre  7. Fluids/Electrolytes/Nutrition: Monitor I/O. Routine labs  -01/12/24 CMP stable today, Cr down to 1.55, AST 44, improving, monitor  4-7: LFTs mildly up trending, BUN and creatinine down, mild hyponatremia.  Limit Tylenol to 3000 mg daily as above.  Repeat in 2 to 3 days.  4/9: LFTS continue up; NA 131. See below  8. Neuropathy/band like pain around waist: Will increase Lyrica to TID 9. Osteomyelitis/discitis. Pending PICC placement. -- Starting 6 weeks of daptomycin and ertapenem for 6 weeks through 02/20/24 to be followed by PO suppression doxycycline and ciprofloxacin.  -01/13/24 Cx NGTD x4d 4-7: ESR added on to a.m. labs.  CRP up today; discussed with ID, no new orders.  10. HTN:  Monitor BP TID--on torsemide 40mg  daily and coreg 6.125mg  BID (not reordered?). -4/5-6/25 BPs mostly fine, coreg not ordered as of now; remain off for now 4-7: Systolic low, p.o. intakes minimal.  Reduce torsemide for tomorrow a.m. to 20 mg, and encourage p.o. fluids today. 4-8: Diastolic remains low, will DC torsemide.  Schedule daily weights. 4/9: BP up a bit, continue to hold torsemide   Vitals:   01/12/24 0423 01/12/24 1355 01/12/24 2029 01/13/24 0552  BP: (!) 122/56 (!) 149/62 (!) 151/58 (!) 117/56   01/13/24 1426 01/13/24 1958 01/14/24 0505 01/14/24 1427  BP: (!) 134/48 (!) 131/57 (!) 129/55 (!) 125/56   01/15/24 0429 01/15/24 1518 01/15/24 1926 01/16/24 0520  BP: Marland Kitchen)  140/59 (!) 114/56 (!) 130/52 (!) 158/84    11. T2DM: Hgb A1C-7.9. Used [name] 50-120/ 5 units ac meal TID at home.              --Continue insulin glargine 20 units daily with SSI.  -4/5-6/25 CBGs a bit better this weekend, monitor trend but may need further adjustments since they're still mostly  >200 4/7: Blood sugars remain elevated, increase glargine to 25 units daily starting tomorrow a.m. CBG (last 3)  Recent Labs    01/15/24 1636 01/15/24 2115 01/16/24 0605  GLUCAP 233* 173* 164*    12.  Acute on chronic renal failure: SCr has had rise to 1.56-->will d/c celebrex.  -01/12/24 Cr 1.55 today, down from 1.86 yesterday, monitor Monday  4-7, 4/9: BUN, creatinine improving.  13. Fever blisters: Will start valtrex 1g BID x1wk as symptomatic. Improving, EOT 4/11 14. Right shoulder pain: Question strain but heard a pop this am and extremely concerned that she may have torn her RTC again --Voltaren gel for local measures.  -- Consider MRI if no improvement 4/8: R shoulder remains very painful/limited - will get x-ray and consider MRI if no acute findings. 4/9: Xray with mild changes; will d/w patient getting MRI today   15. Constipation: Continue Senna 2 tabs daily--miralax was added on 04/04 -01/12/24 LBM yesterday, didn't get the 32oz miralax so d/c'd. Continue miralax 17g daily for now.  -01/13/24 LBM last night, cont regimen as is for now, adjust accordingly 4-7: Increase Senokot-S to 2 tabs twice daily, MiraLAX BID 4/8: large BM   16. Depression/Anxiety: Continue Cymbalta 60mg  nightly and Mirtazapine 30mg  nightly.   17. Neurogenic bladder: Continue foley. D/c in am if has results with laxative tonight. -01/12/24 had BM last night, will pull foley, do timed toileting and PVRs q4-6h, ISC if PVR >391ml -01/13/24 urinating well, low PVRs, continue checking but could d/c tomorrow if still urinating fine.  4-7: Single episode of incontinence overnight, with PVR 170s.  Otherwise continent.  Continue current regimen.  18. ABLA: Hgb 10.5 01/12/24, monitor  -4-7: Hemoglobin down from 10.5-9.4; no overt signs of bleeding, repeat in 1 to 2 days  19. GERD: reordered Protonix 40mg  daily  20.  Transaminitis.  Uptrending on 4-7 labs.  -May be stasis related due to poor mobility; Tylenol  utilization within parameters  -Repeat in 2 days  4/9: Increasing. DC standing tylenol. Get liver US today, + GGT. Per pharmacy medications unlikely contributors.   21. Hyponatremia.   - ?etiology; possible with abx but unlikely   - Holding torsemide as above   - Add 1200 cc fluid restriciton   - Serum OSM + urine OSM, NA 4/9   - repeat in AM  LOS: 5 days A FACE TO FACE EVALUATION WAS PERFORMED  [name] 01/16/2024, 9:56 AM

## 2024-01-16 NOTE — Plan of Care (Signed)
  Problem: Consults Goal: RH SPINAL CORD INJURY PATIENT EDUCATION Description:  See Patient Education module for education specifics.  Outcome: Progressing Goal: Skin Care Protocol Initiated - if Braden Score 18 or less Description: If consults are not indicated, leave blank or document N/A Outcome: Progressing Goal: Nutrition Consult-if indicated Outcome: Progressing Goal: Diabetes Guidelines if Diabetic/Glucose > 140 Description: If diabetic or lab glucose is > 140 mg/dl - Initiate Diabetes/Hyperglycemia Guidelines & Document Interventions  Outcome: Progressing   Problem: SCI BOWEL ELIMINATION Goal: RH STG MANAGE BOWEL WITH ASSISTANCE Description: STG Manage Bowel with minimal  Assistance. Outcome: Progressing Goal: RH STG SCI MANAGE BOWEL WITH MEDICATION WITH ASSISTANCE Description: STG SCI Manage bowel with medication with minimal assistance. Outcome: Progressing   Problem: SCI BLADDER ELIMINATION Goal: RH STG MANAGE BLADDER WITH ASSISTANCE Description: STG Manage Bladder With minimal Assistance Outcome: Progressing Goal: RH STG MANAGE BLADDER WITH MEDICATION WITH ASSISTANCE Description: STG Manage Bladder With Medication With minimal Assistance. Outcome: Progressing   Problem: RH SKIN INTEGRITY Goal: RH STG SKIN FREE OF INFECTION/BREAKDOWN Outcome: Progressing Goal: RH STG MAINTAIN SKIN INTEGRITY WITH ASSISTANCE Description: STG Maintain Skin Integrity With Assistance. Outcome: Progressing Goal: RH STG ABLE TO PERFORM INCISION/WOUND CARE W/ASSISTANCE Description: STG Able To Perform Incision/Wound Care With Assistance. Outcome: Progressing   Problem: RH SAFETY Goal: RH STG ADHERE TO SAFETY PRECAUTIONS W/ASSISTANCE/DEVICE Description: STG Adhere to Safety Precautions With minimal Assistance/Device. Outcome: Progressing Goal: RH STG DECREASED RISK OF FALL WITH ASSISTANCE Description: STG Decreased Risk of Fall With minimal Assistance. Outcome: Progressing   Problem:  RH PAIN MANAGEMENT Goal: RH STG PAIN MANAGED AT OR BELOW PT'S PAIN GOAL Description: <4 w/ prns Outcome: Progressing   Problem: RH KNOWLEDGE DEFICIT SCI Goal: RH STG INCREASE KNOWLEDGE OF SELF CARE AFTER SCI Outcome: Progressing   Problem: Consults Goal: RH SPINAL CORD INJURY PATIENT EDUCATION Description:  See Patient Education module for education specifics.  Outcome: Progressing   Problem: SCI BOWEL ELIMINATION Goal: RH STG MANAGE BOWEL WITH ASSISTANCE Description: STG Manage Bowel with Assistance. Outcome: Progressing   Problem: SCI BLADDER ELIMINATION Goal: RH STG MANAGE BLADDER WITH ASSISTANCE Description: STG Manage Bladder With Assistance Outcome: Progressing   Problem: RH SKIN INTEGRITY Goal: RH STG SKIN FREE OF INFECTION/BREAKDOWN Outcome: Progressing   Problem: RH SAFETY Goal: RH STG ADHERE TO SAFETY PRECAUTIONS W/ASSISTANCE/DEVICE Description: STG Adhere to Safety Precautions With minimal  Assistance/Device. Outcome: Progressing   Problem: RH PAIN MANAGEMENT Goal: RH STG PAIN MANAGED AT OR BELOW PT'S PAIN GOAL Outcome: Progressing   Problem: RH KNOWLEDGE DEFICIT SCI Goal: RH STG INCREASE KNOWLEDGE OF SELF CARE AFTER SCI Description: Manage increase knowledge of self care \\after  SCI  SCI with minimal assistance  using educational materials provided Outcome: Progressing

## 2024-01-16 NOTE — Progress Notes (Signed)
 Occupational Therapy Session Note  Patient Details  Name: Katelyn [name] MRN: [id number] Date of Birth: Aug 19, 1956  Today's Date: 01/16/2024 OT Individual Time: 1321-1416 OT Individual Time Calculation (min): 55 min    Short Term Goals: Week 1:  OT Short Term Goal 1 (Week 1): Pt will thread BLE into LB garments with Min A + LRAD. OT Short Term Goal 2 (Week 1): Pt will complete 1/3 toileting activities with Min A + LRAD. OT Short Term Goal 3 (Week 1): Pt will perform toilet transfer with consistent Mod A.  Skilled Therapeutic Interventions/Progress Updates:  Pt greeted on [facility] with husband present, pt agreeable to OT intervention.    Pt able to state 3/3 back precations and correct wearing schedule for brace.   Transfers/bed mobility/functional mobility: pt completed sit>stand from [facility] with stedy with MIN A. Pt additionally completed sit>stands from w/c to railing in gym. Pt completed 3 stands with MIN - MODA. Worked on lateral weight shifts in standing as precursor to functional gait and lifting one arm at a time off of railing to simulate ADL tasks. Pt reports fatigue after task but motivated by progressession in mobility.   Therapeutic activity:    ADLs:  Grooming: pt completed seated hand hygiene at sink with set- up assist as pt unable to reach to soap dispenser d/t R shoulder pain.    Transfers: dependent transfer off of BSC to w/c in stedy  Toileting: pt with continent b/b void with pt needing total A for 3/3 toileting tasks with pt standing in stedy.    Education: husband inquiring about purchasing a stedy. Assured pt and husband that the goal is not to go home with a stedy but did provide handout on options for purchasing stedy if needed. Both receptive to education.    Exercises: pt completed below BLE therex to facilitate improved LB strength and endurance for higher level functional mobility tasks:  X20 LAQS with 1.5 lb ankle weight  X20 seated marches with 1.5 ankle  weights X20 hip abd/add from sitting position in w/c with level 3 theraband around quads  Ended session with pt supine in bed with all needs within reach and bed alarm activated.                     Therapy Documentation Precautions:  Precautions Precautions: Fall, Back Recall of Precautions/Restrictions: Intact (Pt able to recall 3/3 spinal precautions.) Precaution/Restrictions Comments: Verbally reviewed spinal precautions, re-educated on acronym BLT. Required Braces or Orthoses: Spinal Brace Spinal Brace: Thoracolumbosacral orthotic, Applied in sitting position Restrictions Weight Bearing Restrictions Per Provider Order: No  Pain: Unrated pain reported in R shoulder, reviewed xray with no new findings and provided rest breaks and assistance as needed.    Therapy/Group: Individual Therapy  Pollyann Glen [facility] 01/16/2024, 3:05 PM

## 2024-01-16 NOTE — Progress Notes (Signed)
 Patient ID: Katelyn [name], female   DOB: [birth year], 69 y.o.   MRN: [id number]   Received updates from [facility] reporting cost for IV abx if $100 per week.  SW discussed with ID alternative medication options, and informed not best option for patient.   *SW met with pt and pt husband in room to discuss above. No concerns about cost. Previous Home Infusion was [facility] and [facility]. SW will work on coordinating care with these agencies.   Katelyn [name], MSW, LCSW Office: [phone] Cell: [phone] Fax: [phone]

## 2024-01-16 NOTE — Progress Notes (Signed)
 Physical Therapy Session Note  Patient Details  Name: Katelyn [name] MRN: [id number] Date of Birth: 01-27-1956  Today's Date: 01/16/2024 PT Individual Time: [id number] PT Individual Time Calculation: 70 min  Short Term Goals: Week 1:  PT Short Term Goal 1 (Week 1): pt will perform bed mobility with mod A consistantly PT Short Term Goal 2 (Week 1): pt will transfer sit<>stand with LRAD and mod A consistantly PT Short Term Goal 3 (Week 1): pt will ambulate 39ft with LRAD and max A of 1  Skilled Therapeutic Interventions/Progress Updates:    Pt presents in room in [facility], agreeable to PT. Pt reporting new pain in L upper quadrant this AM, states began last night after completing bed mobility with nursing staff and states feels pain with deep breath. Pt MD present during session and aware. Session focused on gait training, NMR for dynamic standing balance, and weight shifting and BLE muscle fiber recruitment needed for functional gait and transfers, and therapeutic activities for transfer training and education.  Pt transported to main gym dependently for time management. Pt positioned in //bars for gait training. Pt completes 3x8' gait with mod assist for anterior wt shift and blocking BLEs (no buckling noted) and 2nd person WC follow. Pt unable to achieve terminal hip extension in single limb stance, does demonstrate some scissoring however corrects more easily with cueing this session. Pt then completes stedy transfer to EOM with mod assist, pt reporting pain in R shoulder with shoulder flexion, cued to use LUE for transfers going forward.   Pt then completes NMR in standing with BUE support on RW for BLE muscle fiber recruitment, weightshifting and dynamic standing balance including: - marches x10 BLE - hip abduction x5 BLE - side stepping R/L  2x4' (max assist for managing postural stability and RW mgmt) *pt requires mod assist for postural stability and anterior wt shift  Pt completes sit<>stands  from EOM for NMR practice with min/mod assist. Pt then completes stand step transfer with max assist for managing RW and maintaining anterior wt shift as pt unable to achieve terminal hip extension during single limb stance, 2nd person CGA for safety.  Pt provided with seated rest breaks between all gait trials and exercises to promote energy conservation and quality with tasks. During rest breaks pt educated on muscular tone and purpose of interventions with pt agreeable and demonstrating understanding.  Pt returns to room and remains seated in [facility] with all needs within reach, cal light in place and at end of session. Therapist dons compression socks total assist to assist with BLE swelling, education on swelling provided.  Therapy Documentation Precautions:  Precautions Precautions: Fall, Back Recall of Precautions/Restrictions: Intact (Pt able to recall 3/3 spinal precautions.) Precaution/Restrictions Comments: Verbally reviewed spinal precautions, re-educated on acronym BLT. Required Braces or Orthoses: Spinal Brace Spinal Brace: Thoracolumbosacral orthotic, Applied in sitting position Restrictions Weight Bearing Restrictions Per Provider Order: No   Therapy/Group: Individual Therapy  Edwin Cap PT, DPT 01/16/2024, 5:03 PM

## 2024-01-16 NOTE — Progress Notes (Signed)
    [facility]  Date of Admission:  01/11/2024     Reason for Follow Up: Thoracic myelopathy         BRIEF PROGRESS NOTE:  Ms. 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  Discussed plan of care to continue with current dose of ertapenem and daptomycin as these will cover cutibacterium acnes and no changes to OPAT orders needed.  Continue current dose of daptomycin and ertapenem.  Follow-up as planned.  Marcos Eke, NP [facility]  01/16/2024  3:18 PM

## 2024-01-16 NOTE — Progress Notes (Signed)
 ID brief note  Results for orders placed or performed during the hospital encounter of 01/07/24  MRSA Next Gen by PCR, Nasal     Status: None   Collection Time: 01/09/24 11:35 AM   Specimen: Nasal Mucosa; Nasal Swab  Result Value Ref Range Status   MRSA by PCR Next Gen NOT DETECTED NOT DETECTED Final    Comment: (NOTE) The GeneXpert MRSA Assay (FDA approved for NASAL specimens only), is one component of a comprehensive MRSA colonization surveillance program. It is not intended to diagnose MRSA infection nor to guide or monitor treatment for MRSA infections. Test performance is not FDA approved in patients less than 42 years old. Performed at [facility], [address] [place], [place] [address]   Aerobic/Anaerobic Culture w Gram Stain (surgical/deep wound)     Status: None   Collection Time: 01/09/24  4:34 PM   Specimen: Intervertebral Disc; Tissue  Result Value Ref Range Status   Specimen Description WOUND  Final   Special Requests INTERVERTERAL DISC 9,10 PT ON ANCEF  Final   Gram Stain NO WBC SEEN NO ORGANISMS SEEN   Final   Culture   Final    No growth aerobically or anaerobically. Performed at [facility], [address], [place], [place] [address]    Report Status 01/14/2024 FINAL  Final  Aerobic/Anaerobic Culture w Gram Stain (surgical/deep wound)     Status: None   Collection Time: 01/09/24  4:44 PM   Specimen: Intervertebral Disc; Tissue  Result Value Ref Range Status   Specimen Description WOUND  Final   Special Requests INTERVERTEBRAL DISC 9,10 SWAB PT ON ANCEF  Final   Gram Stain NO WBC SEEN NO ORGANISMS SEEN   Final   Culture   Final    RARE CUTIBACTERIUM ACNES Standardized susceptibility testing for this organism is not available. Performed at [facility], [address], [place], [place] [address]    Report Status 01/15/2024 FINAL  Final    Cx updated as above  and have informed IP ID team for updated OPAT  on 4/8.  Odette Fraction, MD Infectious Disease Physician [facility] [address] [place], [place] [address] Phone: [phone]  Fax: [phone]

## 2024-01-17 DIAGNOSIS — M4714 Other spondylosis with myelopathy, thoracic region: Secondary | ICD-10-CM | POA: Diagnosis not present

## 2024-01-17 LAB — GLUCOSE, CAPILLARY
Glucose-Capillary: 191 mg/dL — ABNORMAL HIGH (ref 70–99)
Glucose-Capillary: 229 mg/dL — ABNORMAL HIGH (ref 70–99)
Glucose-Capillary: 231 mg/dL — ABNORMAL HIGH (ref 70–99)
Glucose-Capillary: 271 mg/dL — ABNORMAL HIGH (ref 70–99)

## 2024-01-17 LAB — COMPREHENSIVE METABOLIC PANEL WITH GFR
ALT: 45 U/L — ABNORMAL HIGH (ref 0–44)
AST: 57 U/L — ABNORMAL HIGH (ref 15–41)
Albumin: 2.1 g/dL — ABNORMAL LOW (ref 3.5–5.0)
Alkaline Phosphatase: 280 U/L — ABNORMAL HIGH (ref 38–126)
Anion gap: 8 (ref 5–15)
BUN: 21 mg/dL (ref 8–23)
CO2: 27 mmol/L (ref 22–32)
Calcium: 8.4 mg/dL — ABNORMAL LOW (ref 8.9–10.3)
Chloride: 97 mmol/L — ABNORMAL LOW (ref 98–111)
Creatinine, Ser: 1.11 mg/dL — ABNORMAL HIGH (ref 0.44–1.00)
GFR, Estimated: 55 mL/min — ABNORMAL LOW (ref >60)
Glucose, Bld: 188 mg/dL — ABNORMAL HIGH (ref 70–99)
Potassium: 4.1 mmol/L (ref 3.5–5.1)
Sodium: 132 mmol/L — ABNORMAL LOW (ref 135–145)
Total Bilirubin: 0.6 mg/dL (ref 0.0–1.2)
Total Protein: 5.4 g/dL — ABNORMAL LOW (ref 6.5–8.1)

## 2024-01-17 LAB — CBC
HCT: 27 % — ABNORMAL LOW (ref 36.0–46.0)
Hemoglobin: 8.8 g/dL — ABNORMAL LOW (ref 12.0–15.0)
MCH: 29.6 pg (ref 26.0–34.0)
MCHC: 32.6 g/dL (ref 30.0–36.0)
MCV: 90.9 fL (ref 80.0–100.0)
Platelets: 117 10*3/uL — ABNORMAL LOW (ref 150–400)
RBC: 2.97 MIL/uL — ABNORMAL LOW (ref 3.87–5.11)
RDW: 14.6 % (ref 11.5–15.5)
WBC: 4.7 10*3/uL (ref 4.0–10.5)
nRBC: 0 % (ref 0.0–0.2)

## 2024-01-17 MED ORDER — PANTOPRAZOLE SODIUM 40 MG PO TBEC
40.0000 mg | DELAYED_RELEASE_TABLET | Freq: Two times a day (BID) | ORAL | Status: DC
  Administered 2024-01-17 – 2024-01-21 (×9): 40 mg via ORAL
  Filled 2024-01-17 (×9): qty 1

## 2024-01-17 MED ORDER — TORSEMIDE 20 MG PO TABS
20.0000 mg | ORAL_TABLET | Freq: Every day | ORAL | Status: DC
  Administered 2024-01-17 – 2024-01-22 (×6): 20 mg via ORAL
  Filled 2024-01-17 (×6): qty 1

## 2024-01-17 MED ORDER — PENICILLIN G POTASSIUM 20000000 UNITS IJ SOLR
9.0000 10*6.[IU] | Freq: Two times a day (BID) | INTRAVENOUS | Status: DC
  Administered 2024-01-17 – 2024-01-18 (×3): 9 10*6.[IU] via INTRAVENOUS
  Filled 2024-01-17 (×4): qty 9

## 2024-01-17 MED ORDER — NAPROXEN 250 MG PO TABS
500.0000 mg | ORAL_TABLET | Freq: Two times a day (BID) | ORAL | Status: DC
  Administered 2024-01-17 – 2024-01-19 (×4): 500 mg via ORAL
  Filled 2024-01-17 (×4): qty 2

## 2024-01-17 MED ORDER — CIPROFLOXACIN HCL 500 MG PO TABS
500.0000 mg | ORAL_TABLET | Freq: Two times a day (BID) | ORAL | Status: DC
  Administered 2024-01-17 – 2024-02-01 (×31): 500 mg via ORAL
  Filled 2024-01-17 (×34): qty 1

## 2024-01-17 NOTE — Progress Notes (Signed)
 Occupational Therapy Session Note  Patient Details  Name: Katelyn [name] MRN: [id number] Date of Birth: [birth year]  Today's Date: 01/17/2024 OT Individual Time: 0102-7253 OT Individual Time Calculation (min): 57 min    Short Term Goals: Week 1:  OT Short Term Goal 1 (Week 1): Pt will thread BLE into LB garments with Min A + LRAD. OT Short Term Goal 2 (Week 1): Pt will complete 1/3 toileting activities with Min A + LRAD. OT Short Term Goal 3 (Week 1): Pt will perform toilet transfer with consistent Mod A.  Skilled Therapeutic Interventions/Progress Updates:    Patient agreeable to participate in OT session. Reports on-going right shoulder pain which has been aggravated by increased use of her arms since recent back surgery. Monitored during session. Tasks modified to pt's pain tolerance level as needed. Heat pack provided at end of session.    Patient participated in skilled OT session focusing on ADL re-training and functional transfers.  Pt participated in ADL re-training while participating in bathing at sink level. Due to continuous IV, unable to change PJ top although able to doff to bathe and don once completed.   UB bathing: Pt complete while seated requiring Set-up. Support provided to RUE to allow pt to apply deodorant efficiently.  UB dressing: completed while seated at sink requiring Min A d/t LUE pain and ROM deficits.  Toilet transfer: Completed stand pivot transfer from [facility] to toilet with Mod A utilizing grab bar and BSC over toilet.  Toileting: Completed with total A. Pt requested to lay down in bed at end of session and stedy was utilizing to complete functional transfer d/t increased difficulty managing LLE while transferring off of toilet. Pt required OT to physically move/advance LLE forward while offloading weight. Pt completed sit to stand with stedy requiring Min A.   Bed mobility completed with Mod A requiring assist to manage BLE onto bed. Pt education provided on back  precautions carry over while completing log roll technique. Pt verbalize understanding and was able to demonstrate during session. Bed in trendelenburg position to assist with boosting towards HOB. Pt assisted with BLE and LUE.    Therapy Documentation Precautions:  Precautions Precautions: Fall, Back Recall of Precautions/Restrictions: Intact (Pt able to recall 3/3 spinal precautions.) Precaution/Restrictions Comments: Verbally reviewed spinal precautions, re-educated on acronym BLT. Required Braces or Orthoses: Spinal Brace Spinal Brace: Thoracolumbosacral orthotic, Applied in sitting position Restrictions Weight Bearing Restrictions Per Provider Order: No    Therapy/Group: Individual Therapy  Limmie Patricia, OTR/L,CBIS  Supplemental OT - [facility] and [facility] Secure Chat Preferred   01/17/2024, 3:58 PM

## 2024-01-17 NOTE — Progress Notes (Signed)
 Patient ID: Katelyn [name], female   DOB: [birth year], 68 y.o.   MRN: [id number] Patient seen and examined.  She continues to gain strength in her legs.  She is out of bed to a chair.  She states her pain is a 3 out of 10.  It is much better than before surgery.  Her incision is clean dry and intact and I removed her external VAC.  Please call me if I can be of further assistance

## 2024-01-17 NOTE — Progress Notes (Signed)
 Physical Therapy Session Note  Patient Details  Name: Katelyn [name] MRN: [id number] Date of Birth: September 06, 1956  Today's Date: 01/17/2024 PT Individual Time: 1015-1100 PT Individual Time Calculation (min): 45 min   Short Term Goals: Week 1:  PT Short Term Goal 1 (Week 1): pt will perform bed mobility with mod A consistantly PT Short Term Goal 2 (Week 1): pt will transfer sit<>stand with LRAD and mod A consistantly PT Short Term Goal 3 (Week 1): pt will ambulate 80ft with LRAD and max A of 1  Skilled Therapeutic Interventions/Progress Updates:    Pt presents up in w/c. W/c propulsion x 50' with supervision for functional mobility training and generalized strengthening of UE's. Attempted sit > stand in parallel bars but due to hands "slipping' changed to practice with the RW. Focused on hand placement, technique, anterior weightshift and transitional movement with multiple attempts requiring max assist and pt difficulty getting up into full standing position despite cues and assist on several occasions. Then attempted with BUE pushing of w/c vs one hand on RW and 1 on w/c armrest and pt able to get up better with mod assist. Maintained standing ~ with small weightshifts for pre gait attempts. Pt tearful during session and expressed concerns with having to be here so long. Encouragement and education provided on overall goals and how d/c dates are determined. Pt very receptive.   End of session transferred to recliner via Stedy (mod assist for sit > stand initially and CGA from perched surface) with cues for hand placement and technique.   Left in recliner with MD at bedside.   Therapy Documentation Precautions:  Precautions Precautions: Fall, Back Recall of Precautions/Restrictions: Intact (Pt able to recall 3/3 spinal precautions.) Precaution/Restrictions Comments: Verbally reviewed spinal precautions, re-educated on acronym BLT. Required Braces or Orthoses: Spinal Brace Spinal Brace:  Thoracolumbosacral orthotic, Applied in sitting position Restrictions Weight Bearing Restrictions Per Provider Order: No   Pain: Pain Assessment Pain Scale: 0-10 Pain Score: 0-No pain    Therapy/Group: Individual Therapy  Karolee Stamps Darrol Poke, PT, DPT, CBIS  01/17/2024, 11:42 AM

## 2024-01-17 NOTE — Progress Notes (Signed)
 PROGRESS NOTE   Subjective/Complaints:  No events overnight.  States she did feel slightly tired this a.m., got as needed Norco overnight.  Not nearly as bad as it was with oxycodone.  Thinks that in the past, she had issues with tramadol.  Complaining of increased swelling in her feet  Dr. Yetta Barre evaluated this a.m., removed Hemovac, is happy with current healing and postop functional recovery.  Vital stable with some mild diastolic hypotension overnight  Abdominal ultrasound without acute findings.  LFTs continue to uptrend from yesterday  CT shoulder with some changes suggestive of rotator cuff tear, but no gross visualized pathology.  Patient continuing with pain and range of motion deficits in the right shoulder.  ROS: as per HPI. Denies CP, SOB, abd pain, N/V/D/C, or any other complaints at this time.   + confusion--improved + R shoulder pain  Objective:   CT SHOULDER RIGHT WO CONTRAST Result Date: 01/17/2024 CLINICAL DATA:  Concern for rotator cuff tear. Patient unable to receive MRI due to spinal cord stimulator leads. EXAM: CT OF THE UPPER RIGHT EXTREMITY WITHOUT CONTRAST TECHNIQUE: Multidetector CT imaging of the upper right extremity was performed according to the standard protocol. RADIATION DOSE REDUCTION: This exam was performed according to the departmental dose-optimization program which includes automated exposure control, adjustment of the mA and/or kV according to patient size and/or use of iterative reconstruction technique. COMPARISON:  Right shoulder radiographs dated 01/15/2024. FINDINGS: Evaluation of the rotator cuff, ligaments, and tendons is limited on this CT examination. Bones/Joint/Cartilage No acute fracture. Superior migration of the humeral head with reduction of the acromial humeral interval. Subcortical cystic changes and irregularity of the greater tuberosity. Glenohumeral joint effusion with a tiny  loose bodies in the anterior joint space. Suspected fluid in the subacromial/subdeltoid bursa. Mild degenerative changes of the glenohumeral joint with asymmetric joint space narrowing and small marginal osteophytes. There is anterior decentering of the humeral head relative to the glenoid. The acromioclavicular joint is anatomically aligned with mild-to-moderate degenerative changes. Partially visualized fusion hardware at the level of the mid to lower thoracic spine. Partially visualized spinal stimulator lead terminates at the T6 level. Ligaments Ligaments are suboptimally evaluated by CT. Muscles and Tendons No significant muscle atrophy. Soft tissue Partially visualized right upper extremity PICC. No enlarged lymph nodes identified in the field of view. IMPRESSION: 1. Evaluation of the rotator cuff, ligaments, and tendons is limited on this CT examination. Within these limitations, there are secondary/indirect signs, as described above, suggestive of rotator cuff tear/chronic rotator cuff pathology. Further evaluation with CT arthrogram could be considered. 2. Mild degenerative changes of the glenohumeral joint with joint effusion. 3. Mild-to-moderate degenerative changes of the acromioclavicular joint. 4. Partially visualized fusion hardware at the level of the mid to lower thoracic spine. Electronically Signed   By: Hart Robinsons M.D.   On: 01/17/2024 08:41   US Abdomen Limited RUQ (LIVER/GB) Result Date: 01/17/2024 CLINICAL DATA:  Elevated LFT EXAM: ULTRASOUND ABDOMEN LIMITED RIGHT UPPER QUADRANT COMPARISON:  CT 03/13/2023 FINDINGS: Gallbladder: Surgically absent Common bile duct: Diameter: 3.4 mm Liver: Echogenicity within normal limits. Suspicion of surface nodularity of the liver. No focal hepatic abnormality is seen. Portal vein  is patent on color Doppler imaging with normal direction of blood flow towards the liver. Other: None. IMPRESSION: 1. Status post cholecystectomy. No biliary dilatation. 2.  Suspicion of surface nodularity of the liver, correlate for cirrhosis. Electronically Signed   By: Jasmine Pang M.D.   On: 01/17/2024 00:56   DG Shoulder Right Result Date: 01/15/2024 CLINICAL DATA:  Acute pain of right shoulder due to trauma. EXAM: RIGHT SHOULDER - 2+ VIEW COMPARISON:  None Available. FINDINGS: There is no evidence of fracture or dislocation. Mild degenerative change. Subcortical cystic change in the lateral humeral head. Soft tissues are unremarkable. Right upper extremity PICC is partially included in the field of view. IMPRESSION: 1. No fracture or dislocation of the right shoulder. 2. Mild degenerative change. Electronically Signed   By: Narda Rutherford M.D.   On: 01/15/2024 18:57    Recent Labs    01/17/24 0302  WBC 4.7  HGB 8.8*  HCT 27.0*  PLT 117*   Recent Labs    01/16/24 0320 01/17/24 0302  NA 131* 132*  K 4.0 4.1  CL 94* 97*  CO2 29 27  GLUCOSE 169* 188*  BUN 29* 21  CREATININE 1.18* 1.11*  CALCIUM 8.2* 8.4*      Intake/Output Summary (Last 24 hours) at 01/17/2024 0951 Last data filed at 01/17/2024 0813 Gross per 24 hour  Intake 407 ml  Output 800 ml  Net -393 ml        Physical Exam: Vital Signs Blood pressure 133/66, pulse 92, temperature 97.7 F (36.5 C), temperature source Oral, resp. rate 18, height 5\' 1"  (1.549 m), weight 66.6 kg, SpO2 99%.  Constitutional: No apparent distress. Appropriate appearance for age. +Obese.  Sitting up in bedside chair. HENT: No JVD. Neck Supple. Trachea midline.  Lip swelling resolved Eyes: PERRLA. EOMI. Cardiovascular: RRR, no murmurs/rub/gallops.  1+ bilateral LE Edema. Peripheral pulses 2+  Respiratory: CTAB. No rales, rhonchi, or wheezing. On RA.  Abdomen: + bowel sounds, normoactive. No distention or tenderness.  Skin: PICC line with minimal bruising, intact.  Patient in TLSO, back wound not visualized  MSK:      No apparent deformity. + RUE shoulder abduction/flexion limited to <50  degrees--unchanged        Neurologic exam:  Cognition: AAO to person, place, time and event.  Language: Fluent, No substitutions or neoglisms. No dysarthria.  Memory: No apparent deficits  Insight: Good insight into current condition.  Mood: Pleasant affect, appropriate mood.   Sensation: To light touch reduced in distal fingertips and toes bilaterally;bilateral knees   Reflexes: 2+ in BL UE; hyporeflexic BL LE. No babinski. CN: 2-12 grossly intact.  Coordination: No apparent tremors. No ataxia on FTN bilaterally.  Spasticity: MAS 0 in all extremities.       Strength:                RUE: 4-/5 SA, 5/5 EF, 5/5 EE, 5/5 WE, 5/5 FF, 5/5 FA                LUE:  5/5 SA, 5/5 EF, 5/5 EE, 5/5 WE, 5/5 FF, 5/5 FA                RLE: 3/5 HF, 4/5 KE, 3-/5  DF, 2/5  EHL, 3/5  PF                 LLE:  3/5 HF, 4-/5 KE, 5-/5  DF, 5-/5  EHL, 5-/5  PF    Assessment/Plan: 1. Functional  deficits which require 3+ hours per day of interdisciplinary therapy in a comprehensive inpatient rehab setting. Physiatrist is providing close team supervision and 24 hour management of active medical problems listed below. Physiatrist and rehab team continue to assess barriers to discharge/monitor patient progress toward functional and medical goals  Care Tool:  Bathing    Body parts bathed by patient: Right arm, Chest, Abdomen, Right upper leg, Left upper leg, Face   Body parts bathed by helper: Left arm, Front perineal area, Buttocks, Right lower leg, Left lower leg     Bathing assist Assist Level: Moderate Assistance - Patient 50 - 74%     Upper Body Dressing/Undressing Upper body dressing   What is the patient wearing?: Button up shirt    Upper body assist Assist Level: Moderate Assistance - Patient 50 - 74%    Lower Body Dressing/Undressing Lower body dressing      What is the patient wearing?: Pants     Lower body assist Assist for lower body dressing: Total Assistance - Patient < 25%      Toileting Toileting    Toileting assist Assist for toileting: 2 Helpers     Transfers Chair/bed transfer  Transfers assist     Chair/bed transfer assist level: Dependent - Patient 0% (Stedy)     Locomotion Ambulation   Ambulation assist   Ambulation activity did not occur: Safety/medical concerns (weakness, pain, decreased balance)          Walk 10 feet activity   Assist  Walk 10 feet activity did not occur: Safety/medical concerns (weakness, pain, decreased balance)        Walk 50 feet activity   Assist Walk 50 feet with 2 turns activity did not occur: Safety/medical concerns (weakness, pain, decreased balance)         Walk 150 feet activity   Assist Walk 150 feet activity did not occur: Safety/medical concerns (weakness, pain, decreased balance)         Walk 10 feet on uneven surface  activity   Assist Walk 10 feet on uneven surfaces activity did not occur: Safety/medical concerns (weakness, pain, decreased balance)         Wheelchair     Assist Is the patient using a wheelchair?: Yes Type of Wheelchair: Manual    Wheelchair assist level: Dependent - Patient 0% Max wheelchair distance: >153ft    Wheelchair 50 feet with 2 turns activity    Assist        Assist Level: Dependent - Patient 0%   Wheelchair 150 feet activity     Assist      Assist Level: Dependent - Patient 0%   Blood pressure 133/66, pulse 92, temperature 97.7 F (36.5 C), temperature source Oral, resp. rate 18, height 5\' 1"  (1.549 m), weight 66.6 kg, SpO2 99%.  Medical Problem List and Plan: 1. Functional deficits secondary to  T10 spinal stenosis with myelopathy s/p T7-L2 fusion and replacement of T10, L1, and T12 screws              -patient may shower with PICC, wound sites covered; will need to maintain TLSO             -ELOS/Goals: 9-12 days, SPV PT/OT - 4/25 DC date             -Continue CIR   - 4/8: Max a bed mobility, STEDY - very  fearful of falling. She did some marching at EOB today with the walker. Min A short distances  goals. Mod A UB Max A LBD and toiletting; really limited by pain and ROM in her right arm.    2.  Antithrombotics: -DVT/anticoagulation:  Mechanical: Sequential compression devices, below knee Bilateral lower extremities  -01/13/24 DVT US neg              -antiplatelet therapy: N/A 3. Acute on chronic pain/Pain Management:  Was on Oxycodone 5 mg and Lyrica 75 mg daily PTA.  --Having surgical pain--continue Oxycodone prn. Pain worse at nights-->will schedule oxycodone at bedtime.  -Voltaren gel QID -4/7: DC morphine due to no utilization - 4/8: Oxy scheduled overnight and when taken in the a.m. causing some lethargy intermittently--patient has tolerated hydrocodone in the past, will switch to Norco 2.5 to 5 mg every 6 hours as needed.  Will also schedule Tylenol 500 mg 3 times daily. - 4/9: LFTS up; DC scheduled tylenol. Add oxy to allergy list d/t confusion. Doing much better with Norco - 4/10: Having some a.m. confusion with p.m. Norco.  Has had issues with tramadol in the past.  Patient is already using rarely, advised to continue this regimen and wean off as tolerated.  4. Mood/Behavior/Sleep: LCSW to folow for evaluation and support.              -antipsychotic agents: N/A  -Cymbalta 60mg  nightly, mirapex 1mg  at bedtime, mirtazapine 30mg  nightly  -Patient reports sleeping well  5. Neuropsych/cognition: This patient is capable of making decisions on his own behalf.  4/8: Having increased confusion, staring episodes without new neurologic deficits.  Adjusting pain medications as above.  Has had issues with hallucinations/confusion with cefepime in the past, would consider antibiotic adjustment if alternative causes ruled out.  4/9: episodes resolved with pain medication changes  6. Skin/Wound Care: Routine pressure relief measures. Monitor incision for healing.   4/7: Neurosurgery removed Hemovac  over the weekend, current VAC with no drainage, if no further output in 24 hours will discuss removal with Dr. Delorise Shiner removal in 2 days per Dr. Yetta Barre  Wound VAC removed 4-10  7. Fluids/Electrolytes/Nutrition: Monitor I/O. Routine labs  -01/12/24 CMP stable today, Cr down to 1.55, AST 44, improving, monitor  4-7: LFTs mildly up trending, BUN and creatinine down, mild hyponatremia.  Limit Tylenol to 3000 mg daily as above.  Repeat in 2 to 3 days.  4/9: LFTS continue up; NA 131. See below  8. Neuropathy/band like pain around waist: Will increase Lyrica to TID 9. Osteomyelitis/discitis. Pending PICC placement. -- Starting 6 weeks of daptomycin and ertapenem for 6 weeks through 02/20/24 to be followed by PO suppression doxycycline and ciprofloxacin.  -01/13/24 Cx NGTD x4d--> 1 of 2 cultures grew cutibacterium acnes--covered with current antibiotics 4-7: ESR added on to a.m. labs.  CRP up today; discussed with ID, no new orders. 4-10: Infectious disease changing ertapenem to Cipro due to LFT elevations as below.  Continue daptomycin; has had issues with vancomycin in the past  10. HTN:  Monitor BP TID--on torsemide 40mg  daily and coreg 6.125mg  BID (not reordered?). -4/5-6/25 BPs mostly fine, coreg not ordered as of now; remain off for now 4-7: Systolic low, p.o. intakes minimal.  Reduce torsemide for tomorrow a.m. to 20 mg, and encourage p.o. fluids today. 4-8: Diastolic remains low, will DC torsemide.  Schedule daily weights. 4/9: BP up a bit, continue to hold torsemide  4-10: Increased lower extremity edema, remains with diastolic hypotension but wishes to resume home torsemide; will resume at 20 mg daily.  Continue teds  Vitals:  01/13/24 0552 01/13/24 1426 01/13/24 1958 01/14/24 0505  BP: (!) 117/56 (!) 134/48 (!) 131/57 (!) 129/55   01/14/24 1427 01/15/24 0429 01/15/24 1518 01/15/24 1926  BP: (!) 125/56 (!) 140/59 (!) 114/56 (!) 130/52   01/16/24 0520 01/16/24 1445 01/16/24 2026 01/17/24  0524  BP: (!) 158/84 (!) 118/56 (!) 128/49 133/66    11. T2DM: Hgb A1C-7.9. Used [name] 50-120/ 5 units ac meal TID at home.              --Continue insulin glargine 20 units daily with SSI.  -4/5-6/25 CBGs a bit better this weekend, monitor trend but may need further adjustments since they're still mostly >200 4/7: Blood sugars remain elevated, increase glargine to 25 units daily starting tomorrow a.m. CBG (last 3)  Recent Labs    01/16/24 1628 01/16/24 2052 01/17/24 0559  GLUCAP 257* 190* 191*    12.  Acute on chronic renal failure: SCr has had rise to 1.56-->will d/c celebrex.  -01/12/24 Cr 1.55 today, down from 1.86 yesterday, monitor Monday  4-7, 4/9: BUN, creatinine improving.  13. Fever blisters: Will start valtrex 1g BID x1wk as symptomatic. Improving, EOT 4/11 14. Right shoulder pain: Question strain but heard a pop this am and extremely concerned that she may have torn her RTC again --Voltaren gel for local measures.  -- Consider MRI if no improvement 4/8: R shoulder remains very painful/limited - will get x-ray and consider MRI if no acute findings. 4/9: Xray with mild changes; discussed with patient, unable to get MRI due to spinal cord stimulator leads, will order CT 4/10: CT nondiagnostic, some changes "suggestive of rotator cuff tear", mild before meals and glenohumeral degenerative changes.  Consulted orthopedics, appreciate Earney Hamburg PA-C's evaluation, recommending no intervention at this time, added naproxen 500 mg twice daily.   15. Constipation: Continue Senna 2 tabs daily--miralax was added on 04/04 -01/12/24 LBM yesterday, didn't get the 32oz miralax so d/c'd. Continue miralax 17g daily for now.  -01/13/24 LBM last night, cont regimen as is for now, adjust accordingly 4-7: Increase Senokot-S to 2 tabs twice daily, MiraLAX BID 4/8: large BM   16. Depression/Anxiety: Continue Cymbalta 60mg  nightly and Mirtazapine 30mg  nightly.   17. Neurogenic bladder: Continue  foley. D/c in am if has results with laxative tonight. -01/12/24 had BM last night, will pull foley, do timed toileting and PVRs q4-6h, ISC if PVR >389ml -01/13/24 urinating well, low PVRs, continue checking but could d/c tomorrow if still urinating fine.  4-7: Single episode of incontinence overnight, with PVR 170s.  Otherwise continent.  Continue current regimen.  18. ABLA: Hgb 10.5 01/12/24, monitor  -4-7: Hemoglobin down from 10.5-9.4; no overt signs of bleeding, repeat in 1 to 2 days  4-9: Hemoglobin stable, 8.8  19. GERD: reordered Protonix 40mg  daily  -4-10: Increase Protonix to 40 mg twice daily for GI prophylaxis with antibiotics and now naprosyn  20.  Transaminitis.  Uptrending on 4-7 labs.  -May be stasis related due to poor mobility; Tylenol utilization within parameters  -Repeat in 2 days  4/9: Increasing. DC standing tylenol. Get liver US today, + GGT. Per pharmacy medications unlikely contributors.   4-10: Liver ultrasound without acute findings.  Continues to uptrend; discussed with pharmacy, infectious disease, feel this is close enough to her baseline elevations but no concern but will change ertapenem to Cipro.  21. Hyponatremia.   - ?etiology; possible with abx but unlikely   - Holding torsemide as above   - Add 1200  cc fluid restriciton   - Serum OSM + urine OSM, NA 4/9   - repeat in AM  4-10: Improved today, 132.  Will need to monitor closely with resumption of torsemide.  Add labs in a.m.  LOS: 6 days A FACE TO FACE EVALUATION WAS PERFORMED  [name] 01/17/2024, 9:51 AM

## 2024-01-17 NOTE — Progress Notes (Signed)
 Physical Therapy Session Note  Patient Details  Name: Katelyn [name] MRN: [id number] Date of Birth: Jul 15, 1956  Today's Date: 01/17/2024 PT Individual Time: 0802-0857 + 1302 - 1342 PT Individual Time Calculation (min): 55 min  + 40 min  Short Term Goals: Week 1:  PT Short Term Goal 1 (Week 1): pt will perform bed mobility with mod A consistantly PT Short Term Goal 2 (Week 1): pt will transfer sit<>stand with LRAD and mod A consistantly PT Short Term Goal 3 (Week 1): pt will ambulate 42ft with LRAD and max A of 1   Skilled Therapeutic Interventions/Progress Updates:      1st session: Pt sitting up in recliner - waiting on nursing to assist her to the bathroom. Pt denies any pain but reports feeling a little "loopy" from change in pain Rx last night - reports she slept well but feels a little out of it.   Used Stedy to assist patient to the bathroom. MinA for rising to stand in [place] and dependently transported via [place] to toilet [facility] over toilet). TotalA for managing brief in standing. Pt continent x2 - reports she tired to wipe but unable. TotalA provided for posterior pericare with patient standing in the [place]edy - CGA for standing balance.  Returned to w/c and assisted patient in dressing out of hospital gown into her pajamas. MaxA overall for LB/UB dressing. TotalA for managing her TLSO brace - needed adjustment for her shoulder straps to lengthen them to improve comfort under her axilla's.   Transported at w/c level to ortho rehab gym - assisted to [place] via modA stand step transfer. Assist needed for BLE management to reach foot pedals, especially her RLE. Completed 10 minutes at L3 resistance using BUE/BLE - encouraged SPM > 30 and for keeping knees in neutral position to prevent "frog legging" into abduction. Manual assist needed to keep RLE in neutral. 381 steps total. ModA for stand step transfer back to her w/c form nustep.   Pt requesting to end session at the sink so she could brush  her teeth and comb her hair. All safety measures in place.     2nd session: Pt in recliner to start and agreeable to therapy treatment. Reports R shoulder pain - mobility and distraction provided for pain management.  Used Stedy to transfer patient from recliner to w/c - minA for standing in the [place] and supervision for standing from perched position.  Transported patient at wheelchair level to main rehab gym.   Attempted to complete standing toe taps to 6" stair using 2 hand rails but height too tall and patient unable to clear foot. Downgraded task to use the 3" steps and and both hand rails and she was able to complete 3x3 standing toe taps with minA for balance and minA for assisting in lifting and clearing foot on the step. Patient with decreased motor control while bringing the foot off the step resulting in narrow BOS and sometimes crossing her feet.   Completed standing there-ex at counter height - min/modA for standing from w/c to counter height and CGA for standing balance with counter support (leaning on elbows). Completed 1x10 knee bends + 1x10 heel raises (limited AROM and heel clearance) with CGA/minA for balance.   Returned to her room and patient left sitting upright in w/c with her needs met - pt aware of upcoming OT session.      Therapy Documentation Precautions:  Precautions Precautions: Fall, Back Recall of Precautions/Restrictions: Intact (Pt able to recall  3/3 spinal precautions.) Precaution/Restrictions Comments: Verbally reviewed spinal precautions, re-educated on acronym BLT. Required Braces or Orthoses: Spinal Brace Spinal Brace: Thoracolumbosacral orthotic, Applied in sitting position Restrictions Weight Bearing Restrictions Per Provider Order: No General:      Therapy/Group: Individual Therapy  Orrin Brigham 01/17/2024, 7:50 AM

## 2024-01-17 NOTE — Consult Note (Signed)
 Reason for Consult:Right shoulder pain Referring Physician: Elijah Birk Time called: 1010 Time at bedside: 1040   Katelyn [name] is an 68 y.o. female.  HPI: Katelyn [name] suffered acute right shoulder pain about 3-4 weeks ago when she was transferring with the help of her husband. She did not see anyone about it but is here in CIR 2/2 a spinal myelopathy and orthopedic surgery was consulted. She notes it's better that it was initially as it is less swollen and not painful at rest. She had previous shoulder surgery years ago with Dr. Rennis Chris. She is RHD.  Past Medical History:  Diagnosis Date   Anxiety    Arthritis    Bursitis of right hip    CAD (coronary artery disease)    Cardiac catheterization June 2014 in [place] - 50% circumflex stenosis   Chest pain, neg MI, stable CAD non obstructive on cath 10/05/20 10/04/2020   Chronic diastolic heart failure (HCC) 08/20/2017   Chronic kidney disease, stage 3 (HCC)    does not see nephrologist   Chronic low back pain without sciatica 03/14/2016   Cirrhosis of liver (HCC)    CKD (chronic kidney disease), stage III (HCC) 10/07/2020   Complication of anesthesia    Cough 04/19/2017   Overview:  Last Assessment & Plan:  Formatting of this note may be different from the original. Cough - ? ACE related with AR triggers   Plan  Patient Instructions  Discuss with your primary doctor that lisinopril pain, need making your cough worse. May use Mucinex DM twice daily as needed for cough and congestion Zyrtec 10 mg at bedtime as needed for drainage Saline nasal spray as needed. Lab tests today Activity as tolerated. Follow with Dr. Craige Cotta in 3-4 months and As needed   Please contact office for sooner follow up if symptoms do not improve or worsen or seek emergency care    Depression    Dyspnea    with exertion   " lazy lung" - per  Dr Craige Cotta from back issues- 06/2016   Elevated liver enzymes 12/05/2016   Essential hypertension    GERD (gastroesophageal reflux  disease)    Gout 03/14/2016   Greater trochanteric bursitis of right hip 02/02/2012   H/O hiatal hernia    Heart murmur    History of blood transfusion 2016   History of esophageal stricture 10/07/2020   History of kidney stones    Hypercholesterolemia    Hypertensive heart disease with heart failure (HCC) 01/01/2017   Hypoxia 10/07/2020   Iliotibial band syndrome of right side 02/02/2012   Iron deficiency anemia due to chronic blood loss 03/14/2016   LBBB (left bundle branch block) 01/01/2017   Left bundle branch block    Leg weakness 10/07/2020   Lumbar stenosis    Meralgia paraesthetica 12/05/2016   Mild CAD 11/24/2015   Morbid obesity (HCC) 10/07/2020   Neuropathy    OSA (obstructive sleep apnea) 05/24/2016   Overview:  Managed PULM- no CPAP   PONV (postoperative nausea and vomiting)    "no N/V with patch"   Restless leg syndrome    S/P lumbar laminectomy 11/26/2015   S/P lumbar spinal fusion 08/29/2016   Tinnitus 12/05/2016   Type 2 diabetes mellitus (HCC)    UTI (urinary tract infection) 10/07/2020    Past Surgical History:  Procedure Laterality Date   ABDOMINAL HYSTERECTOMY  1983   APPENDECTOMY  Age 74   BACK SURGERY     FIRST LUMBAR FUSION/ SURGERY APRIL  2012 AND FUSION WITH INSTRUMENTATION SEPT 2012   CARDIAC CATHETERIZATION     x 2   CARPAL TUNNEL RELEASE     bil   CHOLECYSTECTOMY  1990's   COLONOSCOPY  12/12/2017   Colonic polyp status post polypectomy. Mild sigmoid diverticulosis. Otherwise normal colonoscopy to terminal ileum.   CYSTO EXTRACTION KIDNEY STONES     ESOPHAGOGASTRODUODENOSCOPY  12/12/2017   Small hiatal hernia. Mild gastritis. Status post esophageal dilatation.   EXCISION/RELEASE BURSA HIP  02/02/2012   Procedure: EXCISION/RELEASE BURSA HIP;  Surgeon: Jacki Cones, MD;  Location: [facility] ORS;  Service: Orthopedics;  Laterality: Right;  Right Hip Bursectomy   EYE SURGERY Bilateral    cataracts   IR THORACIC DISC ASPIRATION W/IMG GUIDE   02/14/2023   KIDNEY STONE SURGERY  2008   LAMINECTOMY WITH POSTERIOR LATERAL ARTHRODESIS LEVEL 3 N/A 01/09/2024   Procedure: Posterior lateral fusion - Thoracic Seven-Thoracic Eight - Thoracic Eight-Thoracic Nine - Thoracic Nine-Thoracic Ten, Thoracic Ten-Eleven, Thoracic Eleven-Twelve, Thoracic Twelve -Lumbar One, extension of fusion and removal of Thoracic Ten screws;  Surgeon: Arman Bogus, MD;  Location: [facility] OR;  Service: Neurosurgery;  Laterality: N/A;  Posterior lateral fusion - Thoracic Seven-Thora   LAMINECTOMY WITH POSTERIOR LATERAL ARTHRODESIS LEVEL 4 N/A 02/19/2023   Procedure: THORACIC TEN-LUMBAR TWO INSTRUMENTED FUSION;  Surgeon: Tia Alert, MD;  Location: [facility] OR;  Service: Neurosurgery;  Laterality: N/A;   Left knee surgery x 2  1996   reconstruction   LUMBAR DISC SURGERY  08/2016   LUMBAR LAMINECTOMY/DECOMPRESSION MICRODISCECTOMY Right 11/26/2015   Procedure: Extraforaminal Microdiscectomy  - Lumbar two-three- right;  Surgeon: Tia Alert, MD;  Location: [facility] OR[facility];  Service: Neurosurgery;  Laterality: Right;  right    LUMBAR WOUND DEBRIDEMENT N/A 10/25/2016   Procedure: Lumbar wound revision;  Surgeon: Tia Alert, MD;  Location: [facility] OR;  Service: Neurosurgery;  Laterality: N/A;  Lumbar wound revision   Right shoulder surgery  2010   spur   RIGHT/LEFT HEART CATH AND CORONARY ANGIOGRAPHY N/A 10/05/2020   Procedure: RIGHT/LEFT HEART CATH AND CORONARY ANGIOGRAPHY;  Surgeon: Swaz[place], Peter M, MD;  Location: [facility] INVASIVE CV LAB;  Service: Cardiovascular;  Laterality: N/A;   SPINAL CORD STIMULATOR REMOVAL  02/19/2023   Procedure: LUMBAR SPINAL CORD STIMULATOR REMOVAL;  Surgeon: Tia Alert, MD;  Location: [facility] OR;  Service: Neurosurgery;;    Family History  Problem Relation Age of Onset   Lung cancer Father        smoked   Hypertension Father    Stroke Father    Heart failure Mother    Bone cancer Sister    Asthma Sister     Social History:  reports that she has never  smoked. She has never used smokeless tobacco. She reports that she does not currently use alcohol. She reports that she does not use drugs.  Allergies:  Allergies  Allergen Reactions   Atorvastatin Nausea And Vomiting and Other (See Comments)    MYALGIAS   Oxycodone Other (See Comments)    Confusion, staring episodes   Talwin [Pentazocine] Other (See Comments)    headache   Cefepime Other (See Comments)    Presumed AMS/neurotoxicity in the setting of AKI   Gabapentin Swelling   Other Nausea Only    UNSPECIFIED Anesthesia    Medications: I have reviewed the patient's current medications.  Results for orders placed or performed during the hospital encounter of 01/11/24 (from the past 48 hours)  Glucose, capillary  Status: Abnormal   Collection Time: 01/15/24 11:40 AM  Result Value Ref Range   Glucose-Capillary 236 (H) 70 - 99 mg/dL    Comment: Glucose reference range applies only to samples taken after fasting for at least 8 hours.  Glucose, capillary     Status: Abnormal   Collection Time: 01/15/24  4:36 PM  Result Value Ref Range   Glucose-Capillary 233 (H) 70 - 99 mg/dL    Comment: Glucose reference range applies only to samples taken after fasting for at least 8 hours.  Glucose, capillary     Status: Abnormal   Collection Time: 01/15/24  9:15 PM  Result Value Ref Range   Glucose-Capillary 173 (H) 70 - 99 mg/dL    Comment: Glucose reference range applies only to samples taken after fasting for at least 8 hours.   Comment 1 Notify RN   Comprehensive metabolic panel     Status: Abnormal   Collection Time: 01/16/24  3:20 AM  Result Value Ref Range   Sodium 131 (L) 135 - 145 mmol/L   Potassium 4.0 3.5 - 5.1 mmol/L   Chloride 94 (L) 98 - 111 mmol/L   CO2 29 22 - 32 mmol/L   Glucose, Bld 169 (H) 70 - 99 mg/dL    Comment: Glucose reference range applies only to samples taken after fasting for at least 8 hours.   BUN 29 (H) 8 - 23 mg/dL   Creatinine, Ser 4.78 (H) 0.44 -  1.00 mg/dL   Calcium 8.2 (L) 8.9 - 10.3 mg/dL   Total Protein 5.1 (L) 6.5 - 8.1 g/dL   Albumin 2.1 (L) 3.5 - 5.0 g/dL   AST 56 (H) 15 - 41 U/L   ALT 41 0 - 44 U/L   Alkaline Phosphatase 259 (H) 38 - 126 U/L   Total Bilirubin 0.6 0.0 - 1.2 mg/dL   GFR, Estimated 51 (L) >60 mL/min    Comment: (NOTE) Calculated using the CKD-EPI Creatinine Equation (2021)    Anion gap 8 5 - 15    Comment: Performed at [facility] Lab, [address], [place], [place] [address]  Gamma GT     Status: Abnormal   Collection Time: 01/16/24  3:20 AM  Result Value Ref Range   GGT 207 (H) 7 - 50 U/L    Comment: Performed at [facility], [address] [place], [place] [address]  Osmolality     Status: None   Collection Time: 01/16/24  3:20 AM  Result Value Ref Range   Osmolality 282 275 - 295 mOsm/kg    Comment: Performed at [facility] Lab, [address] [place], [place] [address]  Glucose, capillary     Status: Abnormal   Collection Time: 01/16/24  6:05 AM  Result Value Ref Range   Glucose-Capillary 164 (H) 70 - 99 mg/dL    Comment: Glucose reference range applies only to samples taken after fasting for at least 8 hours.   Comment 1 Notify RN   Glucose, capillary     Status: Abnormal   Collection Time: 01/16/24 11:21 AM  Result Value Ref Range   Glucose-Capillary 287 (H) 70 - 99 mg/dL    Comment: Glucose reference range applies only to samples taken after fasting for at least 8 hours.  Glucose, capillary     Status: Abnormal   Collection Time: 01/16/24  4:28 PM  Result Value Ref Range   Glucose-Capillary 257 (H) 70 - 99 mg/dL    Comment:  Glucose reference range applies only to samples taken after fasting for at least 8 hours.  Osmolality, urine     Status: None   Collection Time: 01/16/24  6:53 PM  Result Value Ref Range   Osmolality, Ur 455 300 - 900 mOsm/kg    Comment: REPEATED TO VERIFY Performed at [facility] Lab, [address], [place], [place] [address]   Sodium,  urine, random     Status: None   Collection Time: 01/16/24  6:53 PM  Result Value Ref Range   Sodium, Ur 50 mmol/L    Comment: Performed at [facility], [address] [place], [place] [address]  Glucose, capillary     Status: Abnormal   Collection Time: 01/16/24  8:52 PM  Result Value Ref Range   Glucose-Capillary 190 (H) 70 - 99 mg/dL    Comment: Glucose reference range applies only to samples taken after fasting for at least 8 hours.  Comprehensive metabolic panel     Status: Abnormal   Collection Time: 01/17/24  3:02 AM  Result Value Ref Range   Sodium 132 (L) 135 - 145 mmol/L   Potassium 4.1 3.5 - 5.1 mmol/L   Chloride 97 (L) 98 - 111 mmol/L   CO2 27 22 - 32 mmol/L   Glucose, Bld 188 (H) 70 - 99 mg/dL    Comment: Glucose reference range applies only to samples taken after fasting for at least 8 hours.   BUN 21 8 - 23 mg/dL   Creatinine, Ser 2.44 (H) 0.44 - 1.00 mg/dL   Calcium 8.4 (L) 8.9 - 10.3 mg/dL   Total Protein 5.4 (L) 6.5 - 8.1 g/dL   Albumin 2.1 (L) 3.5 - 5.0 g/dL   AST 57 (H) 15 - 41 U/L   ALT 45 (H) 0 - 44 U/L   Alkaline Phosphatase 280 (H) 38 - 126 U/L   Total Bilirubin 0.6 0.0 - 1.2 mg/dL   GFR, Estimated 55 (L) >60 mL/min    Comment: (NOTE) Calculated using the CKD-EPI Creatinine Equation (2021)    Anion gap 8 5 - 15    Comment: Performed at [facility], [address] [place], [place] [address]  CBC     Status: Abnormal   Collection Time: 01/17/24  3:02 AM  Result Value Ref Range   WBC 4.7 4.0 - 10.5 K/uL   RBC 2.97 (L) 3.87 - 5.11 MIL/uL   Hemoglobin 8.8 (L) 12.0 - 15.0 g/dL   HCT 25.3 (L) 66.4 - 40.3 %   MCV 90.9 80.0 - 100.0 fL   MCH 29.6 26.0 - 34.0 pg   MCHC 32.6 30.0 - 36.0 g/dL   RDW 47.4 25.9 - 56.3 %   Platelets 117 (L) 150 - 400 K/uL   nRBC 0.0 0.0 - 0.2 %    Comment: Performed at [facility], [address], [place], [place] [address]  Glucose, capillary     Status: Abnormal   Collection Time: 01/17/24  5:59 AM   Result Value Ref Range   Glucose-Capillary 191 (H) 70 - 99 mg/dL    Comment: Glucose reference range applies only to samples taken after fasting for at least 8 hours.    CT SHOULDER RIGHT WO CONTRAST Result Date: 01/17/2024 CLINICAL DATA:  Concern for rotator cuff tear. Patient unable to receive MRI due to spinal cord stimulator leads. EXAM: CT OF THE UPPER RIGHT EXTREMITY WITHOUT CONTRAST TECHNIQUE: Multidetector CT imaging of the upper right extremity  was performed according to the standard protocol. RADIATION DOSE REDUCTION: This exam was performed according to the departmental dose-optimization program which includes automated exposure control, adjustment of the mA and/or kV according to patient size and/or use of iterative reconstruction technique. COMPARISON:  Right shoulder radiographs dated 01/15/2024. FINDINGS: Evaluation of the rotator cuff, ligaments, and tendons is limited on this CT examination. Bones/Joint/Cartilage No acute fracture. Superior migration of the humeral head with reduction of the acromial humeral interval. Subcortical cystic changes and irregularity of the greater tuberosity. Glenohumeral joint effusion with a tiny loose bodies in the anterior joint space. Suspected fluid in the subacromial/subdeltoid bursa. Mild degenerative changes of the glenohumeral joint with asymmetric joint space narrowing and small marginal osteophytes. There is anterior decentering of the humeral head relative to the glenoid. The acromioclavicular joint is anatomically aligned with mild-to-moderate degenerative changes. Partially visualized fusion hardware at the level of the mid to lower thoracic spine. Partially visualized spinal stimulator lead terminates at the T6 level. Ligaments Ligaments are suboptimally evaluated by CT. Muscles and Tendons No significant muscle atrophy. Soft tissue Partially visualized right upper extremity PICC. No enlarged lymph nodes identified in the field of view.  IMPRESSION: 1. Evaluation of the rotator cuff, ligaments, and tendons is limited on this CT examination. Within these limitations, there are secondary/indirect signs, as described above, suggestive of rotator cuff tear/chronic rotator cuff pathology. Further evaluation with CT arthrogram could be considered. 2. Mild degenerative changes of the glenohumeral joint with joint effusion. 3. Mild-to-moderate degenerative changes of the acromioclavicular joint. 4. Partially visualized fusion hardware at the level of the mid to lower thoracic spine. Electronically Signed   By: Hart Robinsons M.D.   On: 01/17/2024 08:41   US Abdomen Limited RUQ (LIVER/GB) Result Date: 01/17/2024 CLINICAL DATA:  Elevated LFT EXAM: ULTRASOUND ABDOMEN LIMITED RIGHT UPPER QUADRANT COMPARISON:  CT 03/13/2023 FINDINGS: Gallbladder: Surgically absent Common bile duct: Diameter: 3.4 mm Liver: Echogenicity within normal limits. Suspicion of surface nodularity of the liver. No focal hepatic abnormality is seen. Portal vein is patent on color Doppler imaging with normal direction of blood flow towards the liver. Other: None. IMPRESSION: 1. Status post cholecystectomy. No biliary dilatation. 2. Suspicion of surface nodularity of the liver, correlate for cirrhosis. Electronically Signed   By: Jasmine Pang M.D.   On: 01/17/2024 00:56   DG Shoulder Right Result Date: 01/15/2024 CLINICAL DATA:  Acute pain of right shoulder due to trauma. EXAM: RIGHT SHOULDER - 2+ VIEW COMPARISON:  None Available. FINDINGS: There is no evidence of fracture or dislocation. Mild degenerative change. Subcortical cystic change in the lateral humeral head. Soft tissues are unremarkable. Right upper extremity PICC is partially included in the field of view. IMPRESSION: 1. No fracture or dislocation of the right shoulder. 2. Mild degenerative change. Electronically Signed   By: Narda Rutherford M.D.   On: 01/15/2024 18:57    Review of Systems  HENT:  Negative for ear  discharge, ear pain, hearing loss and tinnitus.   Eyes:  Negative for photophobia and pain.  Respiratory:  Negative for cough and shortness of breath.   Cardiovascular:  Negative for chest pain.  Gastrointestinal:  Negative for abdominal pain, nausea and vomiting.  Genitourinary:  Negative for dysuria, flank pain, frequency and urgency.  Musculoskeletal:  Positive for arthralgias (Right shoulder) and back pain. Negative for myalgias and neck pain.  Neurological:  Negative for dizziness and headaches.  Hematological:  Does not bruise/bleed easily.  Psychiatric/Behavioral:  The patient is not nervous/anxious.  Blood pressure 133/66, pulse 92, temperature 97.7 F (36.5 C), temperature source Oral, resp. rate 18, height 5\' 1"  (1.549 m), weight 66.6 kg, SpO2 99%. Physical Exam Constitutional:      General: She is not in acute distress.    Appearance: She is well-developed. She is not diaphoretic.  HENT:     Head: Normocephalic and atraumatic.  Eyes:     General: No scleral icterus.       Right eye: No discharge.        Left eye: No discharge.     Conjunctiva/sclera: Conjunctivae normal.  Cardiovascular:     Rate and Rhythm: Normal rate and regular rhythm.  Pulmonary:     Effort: Pulmonary effort is normal. No respiratory distress.  Musculoskeletal:     Cervical back: Normal range of motion.     Comments: Right shoulder, elbow, wrist, digits- no skin wounds, nontender, shoulder elevation and abduction 3/5, flexion 5/5, no blocks to motion, no pain with PROM  Sens  Ax/R/M/U intact  Mot   Ax/ R/ PIN/ M/ AIN/ U intact  Rad 2+  Skin:    General: Skin is warm and dry.  Neurological:     Mental Status: She is alert.  Psychiatric:        Mood and Affect: Mood normal.        Behavior: Behavior normal.     Assessment/Plan: Right shoulder pain -- Given acuity and just now starting PT would recommend continuation of present modalities. Continue Voltaren gel, will add oral NSAID. PT for  core shoulder and scapular strengthening. F/u with Dr. Rennis Chris at discharge. Could consider [place] for diagnosis as can't get MRI but as would not change initial treatment plan fine to defer it to the OP setting.    Freeman Caldron, PA-C Orthopedic Surgery [phone] 01/17/2024, 10:56 AM

## 2024-01-17 NOTE — Progress Notes (Signed)
 PHARMACY CONSULT NOTE FOR:  OUTPATIENT  PARENTERAL ANTIBIOTIC THERAPY (OPAT)  Indication: C acnes epidural abscess/discitis Regimen: Rocephin 2g IV every 24 hours End date: 02/20/24 (6 weeks from OR 01/09/24)  The patient is to continue her Ciprofloxacin for suppression given presence of hardware and history of prior Serratia.   IV antibiotic discharge orders are pended. To discharging provider:  please sign these orders via discharge navigator,  Select New Orders & click on the button choice - Manage This Unsigned Work.     Thank you for allowing pharmacy to be a part of this patient's care.  Georgina Pillion, PharmD, BCPS, BCIDP Infectious Diseases Clinical Pharmacist 01/18/2024 3:53 PM   **Pharmacist phone directory can now be found on [email or web address] ([email or web address]).  Listed under [facility].

## 2024-01-17 NOTE — Progress Notes (Addendum)
 Occupational Therapy Weekly Progress Note  Patient Details  Name: Katelyn [name] MRN: [id number] Date of Birth: 1955-12-13   Beginning of progress report period: January 12, 2024 End of progress report period: January 19, 2024  Today's Date: 01/19/2024 OT Individual Time: 7425-9563 OT Individual Time Calculation (min): 40 min   Patient has met 3 of 3 short term goals this reporting period. Pt currently requires Min A for UB care due to RUE ROM deficits due to pain and Max A for standing LB care due to BLE weakness and decrease balance with use of RW. LB care at bed-level is closer to Min A with use of compensatory techniques such as circle sitting and lateral rolls. Caregiver education to be scheduled closer to discharge date, although spouse present and attentive during sessions.    Patient continues to demonstrate the following deficits: muscle weakness and muscle joint tightness, decreased cardiorespiratoy endurance, impaired timing and sequencing, unbalanced muscle activation, and decreased coordination, decreased carryover of spinal precautions, decreased memory, and decreased standing balance and decreased balance strategies and therefore will continue to benefit from skilled OT intervention to enhance overall performance with BADL and Reduce care partner burden.  Patient progressing toward long term goals..  Continue plan of care.  OT Short Term Goals Week 1:  OT Short Term Goal 1 (Week 1): Pt will thread BLE into LB garments with Min A + LRAD. OT Short Term Goal 1 - Progress (Week 1): Met OT Short Term Goal 2 (Week 1): Pt will complete 1/3 toileting activities with Min A + LRAD. OT Short Term Goal 2 - Progress (Week 1): Met OT Short Term Goal 3 (Week 1): Pt will perform toilet transfer with consistent Mod A. OT Short Term Goal 3 - Progress (Week 1): Met Week 2:  OT Short Term Goal 1 (Week 2): Pt will perform hike/lowering of LB garments with Min A + RW. OT Short Term Goal 2 (Week 2): Pt  will manage UB care with setup/supervision, incorporating compensatory techniques with Min cuing. OT Short Term Goal 3 (Week 2): Pt will perform LB bathing with consisten Min A + LRAD.  Skilled Therapeutic Interventions/Progress Updates:  Pt received sitting in WC, un-rated pain at surgical site, pre-medicated. Pt dependent for transport from room<>day room for time management. In day room, pt completes x2 bouts of short ambulatory transfers, ~40ft each (including x2 turns), with Min A + RW, cuing provided to aim RLE towards R wheel of RW to address scissoring of RLE during ambulation.   Pt then performs x3 reps of static stance with unilateral support for BLE strengthening and balance challenge, all for carryover into standing ADL independence and decreased caregiver burden. Pt requires overall Min A to maintain balance, mirror used for visual feedback, multimodal cuing provided for glute activation and knee extension.   Pt remained sitting in WC, spouse present, and all immediate needs met.   Therapy Documentation Precautions:  Precautions Precautions: Fall, Back Recall of Precautions/Restrictions: Intact (Pt able to recall 3/3 spinal precautions.) Precaution/Restrictions Comments: Verbally reviewed spinal precautions, re-educated on acronym BLT. Required Braces or Orthoses: Spinal Brace Spinal Brace: Thoracolumbosacral orthotic, Applied in sitting position Restrictions Weight Bearing Restrictions Per Provider Order: No   Therapy/Group: Individual Therapy  Artemus Biles, OTR/L, MSOT  01/19/2024, 4:45 PM

## 2024-01-18 DIAGNOSIS — M4714 Other spondylosis with myelopathy, thoracic region: Secondary | ICD-10-CM | POA: Diagnosis not present

## 2024-01-18 LAB — COMPREHENSIVE METABOLIC PANEL WITH GFR
ALT: 48 U/L — ABNORMAL HIGH (ref 0–44)
AST: 59 U/L — ABNORMAL HIGH (ref 15–41)
Albumin: 2 g/dL — ABNORMAL LOW (ref 3.5–5.0)
Alkaline Phosphatase: 271 U/L — ABNORMAL HIGH (ref 38–126)
Anion gap: 9 (ref 5–15)
BUN: 22 mg/dL (ref 8–23)
CO2: 25 mmol/L (ref 22–32)
Calcium: 8.1 mg/dL — ABNORMAL LOW (ref 8.9–10.3)
Chloride: 98 mmol/L (ref 98–111)
Creatinine, Ser: 1.26 mg/dL — ABNORMAL HIGH (ref 0.44–1.00)
GFR, Estimated: 47 mL/min — ABNORMAL LOW (ref >60)
Glucose, Bld: 195 mg/dL — ABNORMAL HIGH (ref 70–99)
Potassium: 4 mmol/L (ref 3.5–5.1)
Sodium: 132 mmol/L — ABNORMAL LOW (ref 135–145)
Total Bilirubin: 0.5 mg/dL (ref 0.0–1.2)
Total Protein: 4.9 g/dL — ABNORMAL LOW (ref 6.5–8.1)

## 2024-01-18 LAB — CBC WITH DIFFERENTIAL/PLATELET
Abs Immature Granulocytes: 0.01 10*3/uL (ref 0.00–0.07)
Basophils Absolute: 0 10*3/uL (ref 0.0–0.1)
Basophils Relative: 1 %
Eosinophils Absolute: 0.3 10*3/uL (ref 0.0–0.5)
Eosinophils Relative: 9 %
HCT: 24.7 % — ABNORMAL LOW (ref 36.0–46.0)
Hemoglobin: 8.1 g/dL — ABNORMAL LOW (ref 12.0–15.0)
Immature Granulocytes: 0 %
Lymphocytes Relative: 25 %
Lymphs Abs: 0.9 10*3/uL (ref 0.7–4.0)
MCH: 30 pg (ref 26.0–34.0)
MCHC: 32.8 g/dL (ref 30.0–36.0)
MCV: 91.5 fL (ref 80.0–100.0)
Monocytes Absolute: 0.6 10*3/uL (ref 0.1–1.0)
Monocytes Relative: 15 %
Neutro Abs: 1.9 10*3/uL (ref 1.7–7.7)
Neutrophils Relative %: 50 %
Platelets: 111 10*3/uL — ABNORMAL LOW (ref 150–400)
RBC: 2.7 MIL/uL — ABNORMAL LOW (ref 3.87–5.11)
RDW: 14.6 % (ref 11.5–15.5)
WBC: 3.7 10*3/uL — ABNORMAL LOW (ref 4.0–10.5)
nRBC: 0 % (ref 0.0–0.2)

## 2024-01-18 LAB — GLUCOSE, CAPILLARY
Glucose-Capillary: 187 mg/dL — ABNORMAL HIGH (ref 70–99)
Glucose-Capillary: 222 mg/dL — ABNORMAL HIGH (ref 70–99)
Glucose-Capillary: 215 mg/dL — ABNORMAL HIGH (ref 70–99)
Glucose-Capillary: 203 mg/dL — ABNORMAL HIGH (ref 70–99)

## 2024-01-18 MED ORDER — INSULIN ASPART 100 UNIT/ML IJ SOLN
3.0000 [IU] | Freq: Three times a day (TID) | INTRAMUSCULAR | Status: DC
  Administered 2024-01-18 – 2024-01-24 (×18): 3 [IU] via SUBCUTANEOUS

## 2024-01-18 MED ORDER — SODIUM CHLORIDE 0.9 % IV SOLN
2.0000 g | Freq: Every day | INTRAVENOUS | Status: DC
  Administered 2024-01-18 – 2024-02-01 (×15): 2 g via INTRAVENOUS
  Filled 2024-01-18 (×15): qty 20

## 2024-01-18 NOTE — Progress Notes (Signed)
 Patient has ABT infusing to PICC line with no complications. She denied pain. She has been calm and cooperative with all care. She did have a large BM early this morning. Her weight was checked twice on bed and was 90.3kg. Yesterday it was documented at 66.6kg but the previous weights had been in the upper 80's kg.  She slept well this shift. No concerns at this time.

## 2024-01-18 NOTE — Progress Notes (Signed)
 Physical Therapy Weekly Progress Note  Patient Details  Name: Katelyn [name] MRN: [id number] Date of Birth: 1956-01-17  Beginning of progress report period: January 12, 2024 End of progress report period: January 19, 2024  Today's Date: 01/18/2024 PT Individual Time: 7425-9563 PT Individual Time Calculation (min): 70 min   Patient has met 1 of 3 short term goals.  Pt is making slow but steady progress towards functional goals. Pt continues to require max assist for bed mobility, mod assist for sit to stand transfers. Pt ambulating with mod assist and +2 WC follow length of //bars. Pt self propels MWC 5' with supervision.  Patient continues to demonstrate the following deficits muscle weakness, decreased cardiorespiratoy endurance, impaired timing and sequencing, abnormal tone, and decreased coordination, and decreased sitting balance, decreased standing balance, decreased postural control, and decreased balance strategies and therefore will continue to benefit from skilled PT intervention to increase functional independence with mobility.  Patient progressing toward long term goals..  Continue plan of care.  PT Short Term Goals Week 1:  PT Short Term Goal 1 (Week 1): pt will perform bed mobility with mod A consistantly PT Short Term Goal 1 - Progress (Week 1): Progressing toward goal PT Short Term Goal 2 (Week 1): pt will transfer sit<>stand with LRAD and mod A consistantly PT Short Term Goal 2 - Progress (Week 1): Met PT Short Term Goal 3 (Week 1): pt will ambulate 16ft with LRAD and max A of 1 PT Short Term Goal 3 - Progress (Week 1): Progressing toward goal Week 2:  PT Short Term Goal 1 (Week 2): Pt will complete bed mobility with mod assist consistently PT Short Term Goal 2 (Week 2): Pt will ambulate with RW 31' with mod assist and 2nd person WC follow PT Short Term Goal 3 (Week 2): Pt will complete stand pivot transfers bed<>WC with mod assist consistently  Skilled Therapeutic  Interventions/Progress Updates:  Pt presents in room in [facility], agreeable to PT, reports some pain in R shoulder from yesterday but does not report pain with mobility this session. Session focused on transfer training, gait training with RW and pregait training, and therapeutic exercise for BLE strengthening needed for functional transfers and ambulation. Pt completes sit<>stand with RW mod progress to min assist within session.  Pt completes WC mobility, self propels with BUEs to day room 50' with supervision, min assist for L path deviation. Pt denies pain in bilateral shoulders with mobility.  Pt then completes gait training with RW 10', 2x12' mod assist and +2 WC follow with pt demonstrating excessive proximity to RW but able to manage without assistance. Pt demonstrating ataxia however improved foot placement with decreased instance of scissoring. Pt with trunk flexion however, hip extension with single limb stance, increasing flexion with fatigue.  Pt then completes pregait training in standing with BUE support on RW forward/backward step x10 BLE and lateral step x4 BLE, pt provided with rest breaks between trials.  Pt completes interval training on kinetron for BLE strengthening and activity tolerance workload 60 cm/sec 30 sec work/30 sec rest for 10 minutes total. Pt demonstrating increased ROM compared to previous session.  Pt then completes 2x5 sit<>stands with min assist, first set pt with BUEs on RW requires min assist for anterior wt shift, 2nd set pt pushes with LUE on WC armrest and RUE on RW with min assist.  Pt returns to room and remains seated in [facility] with all needs within reach, cal light in place and chair alarm  donned and activated at end of session.   Ambulation/gait training;Discharge planning;Psychosocial support;Functional mobility training;Therapeutic Activities;Balance/vestibular training;Disease management/prevention;Neuromuscular re-education;Skin care/wound  management;Therapeutic Exercise;Wheelchair propulsion/positioning;Cognitive remediation/compensation;DME/adaptive equipment instruction;Pain management;Splinting/orthotics;UE/LE Strength taining/ROM;Community reintegration;Functional electrical stimulation;Patient/family education;Stair training;UE/LE Coordination activities   Therapy Documentation Precautions:  Precautions Precautions: Fall, Back Recall of Precautions/Restrictions: Intact (Pt able to recall 3/3 spinal precautions.) Precaution/Restrictions Comments: Verbally reviewed spinal precautions, re-educated on acronym BLT. Required Braces or Orthoses: Spinal Brace Spinal Brace: Thoracolumbosacral orthotic, Applied in sitting position Restrictions Weight Bearing Restrictions Per Provider Order: No   Therapy/Group: Individual Therapy  Edwin Cap PT, DPT 01/18/2024, 11:05 AM

## 2024-01-18 NOTE — Progress Notes (Addendum)
 Patient ID: Katelyn [name], female   DOB: 11/08/1955, 68 y.o.   MRN: [id number]  SW received updates form [facility] reporting that there is a medication change, and penicillin being added. They are unable to provide SN, so pt will need HHA. They are also going to run her insurance for approval.   SW sent HHPT/OT/SN to [facility] as preference. SW waiting on follow-up. *[facility] reports already following patient.    Cecile Sheerer, MSW, LCSW Office: [phone] Cell: [phone] Fax: [phone]

## 2024-01-18 NOTE — Progress Notes (Signed)
 PROGRESS NOTE   Subjective/Complaints:  No events overnight.  No acute complaints.  Shoulder is feeling somewhat better with addition of naproxen. Notes some oozing from back wounds after VAC removal this a.m., covered with ABD pad.  No increased pain, fevers, or chills. Blood pressures are soft but stable.  Other vitals are stable. Continent of bowel and bladder, last bowel movement 4-11.  Large  ROS: as per HPI. Denies CP, SOB, abd pain, N/V/D/C, or any other complaints at this time.   + confusion--improved, associated with pain medication + R shoulder pain--improved  Objective:   CT SHOULDER RIGHT WO CONTRAST Result Date: 01/17/2024 CLINICAL DATA:  Concern for rotator cuff tear. Patient unable to receive MRI due to spinal cord stimulator leads. EXAM: CT OF THE UPPER RIGHT EXTREMITY WITHOUT CONTRAST TECHNIQUE: Multidetector CT imaging of the upper right extremity was performed according to the standard protocol. RADIATION DOSE REDUCTION: This exam was performed according to the departmental dose-optimization program which includes automated exposure control, adjustment of the mA and/or kV according to patient size and/or use of iterative reconstruction technique. COMPARISON:  Right shoulder radiographs dated 01/15/2024. FINDINGS: Evaluation of the rotator cuff, ligaments, and tendons is limited on this CT examination. Bones/Joint/Cartilage No acute fracture. Superior migration of the humeral head with reduction of the acromial humeral interval. Subcortical cystic changes and irregularity of the greater tuberosity. Glenohumeral joint effusion with a tiny loose bodies in the anterior joint space. Suspected fluid in the subacromial/subdeltoid bursa. Mild degenerative changes of the glenohumeral joint with asymmetric joint space narrowing and small marginal osteophytes. There is anterior decentering of the humeral head relative to the glenoid.  The acromioclavicular joint is anatomically aligned with mild-to-moderate degenerative changes. Partially visualized fusion hardware at the level of the mid to lower thoracic spine. Partially visualized spinal stimulator lead terminates at the T6 level. Ligaments Ligaments are suboptimally evaluated by CT. Muscles and Tendons No significant muscle atrophy. Soft tissue Partially visualized right upper extremity PICC. No enlarged lymph nodes identified in the field of view. IMPRESSION: 1. Evaluation of the rotator cuff, ligaments, and tendons is limited on this CT examination. Within these limitations, there are secondary/indirect signs, as described above, suggestive of rotator cuff tear/chronic rotator cuff pathology. Further evaluation with CT arthrogram could be considered. 2. Mild degenerative changes of the glenohumeral joint with joint effusion. 3. Mild-to-moderate degenerative changes of the acromioclavicular joint. 4. Partially visualized fusion hardware at the level of the mid to lower thoracic spine. Electronically Signed   By: Hart Robinsons M.D.   On: 01/17/2024 08:41   US Abdomen Limited RUQ (LIVER/GB) Result Date: 01/17/2024 CLINICAL DATA:  Elevated LFT EXAM: ULTRASOUND ABDOMEN LIMITED RIGHT UPPER QUADRANT COMPARISON:  CT 03/13/2023 FINDINGS: Gallbladder: Surgically absent Common bile duct: Diameter: 3.4 mm Liver: Echogenicity within normal limits. Suspicion of surface nodularity of the liver. No focal hepatic abnormality is seen. Portal vein is patent on color Doppler imaging with normal direction of blood flow towards the liver. Other: None. IMPRESSION: 1. Status post cholecystectomy. No biliary dilatation. 2. Suspicion of surface nodularity of the liver, correlate for cirrhosis. Electronically Signed   By: Jasmine Pang M.D.   On: 01/17/2024 00:56  Recent Labs    01/17/24 0302 01/18/24 0347  WBC 4.7 3.7*  HGB 8.8* 8.1*  HCT 27.0* 24.7*  PLT 117* 111*   Recent Labs     01/17/24 0302 01/18/24 0347  NA 132* 132*  K 4.1 4.0  CL 97* 98  CO2 27 25  GLUCOSE 188* 195*  BUN 21 22  CREATININE 1.11* 1.26*  CALCIUM 8.4* 8.1*      Intake/Output Summary (Last 24 hours) at 01/18/2024 1445 Last data filed at 01/18/2024 0815 Gross per 24 hour  Intake 690 ml  Output --  Net 690 ml        Physical Exam: Vital Signs Blood pressure (!) 127/56, pulse 84, temperature 97.9 F (36.6 C), resp. rate 16, height 5\' 1"  (1.549 m), weight 90.3 kg, SpO2 97%.  Constitutional: No apparent distress. Appropriate appearance for age. +Obese.  Sitting up in gym. HENT: No JVD. Neck Supple. Trachea midline.  Lip swelling resolved Eyes: PERRLA. EOMI. Cardiovascular: RRR, no murmurs/rub/gallops.  1+ bilateral LE Edema. Peripheral pulses 2+  Respiratory: CTAB. No rales, rhonchi, or wheezing. On RA.  Abdomen: + bowel sounds, normoactive. No distention or tenderness.  Skin: PICC line with minimal bruising, intact.  Patient in TLSO, back wound not visualized  MSK:      No apparent deformity. + RUE shoulder abduction/flexion limited to <50 degrees--unchanged        Neurologic exam:  Cognition: AAO to person, place, time and event.  Language: Fluent, No substitutions or neoglisms. No dysarthria.  Memory: No apparent deficits  Insight: Good insight into current condition.  Mood: Pleasant affect, appropriate mood.   Sensation: To light touch reduced in distal fingertips and toes bilaterally; sensation improving at bilateral knees   Reflexes: 2+ in BL UE; hyporeflexic BL LE. No babinski. CN: 2-12 grossly intact.  Coordination: No apparent tremors. No ataxia on FTN bilaterally.  Spasticity: MAS 0 in all extremities.       Strength:                RUE: 4-/5 SA, 5/5 EF, 5/5 EE, 5/5 WE, 5/5 FF, 5/5 FA                LUE:  5/5 SA, 5/5 EF, 5/5 EE, 5/5 WE, 5/5 FF, 5/5 FA                RLE: 3+/5 HF, 4-/5 KE, 3-/5  DF, 2/5  EHL, 3/5  PF                 LLE:  3-/5 HF, 4-/5 KE, 5-/5   DF, 5-/5  EHL, 5-/5  PF    Assessment/Plan: 1. Functional deficits which require 3+ hours per day of interdisciplinary therapy in a comprehensive inpatient rehab setting. Physiatrist is providing close team supervision and 24 hour management of active medical problems listed below. Physiatrist and rehab team continue to assess barriers to discharge/monitor patient progress toward functional and medical goals  Care Tool:  Bathing    Body parts bathed by patient: Right arm, Chest, Abdomen, Right upper leg, Left upper leg, Face   Body parts bathed by helper: Left arm, Front perineal area, Buttocks, Right lower leg, Left lower leg     Bathing assist Assist Level: Moderate Assistance - Patient 50 - 74%     Upper Body Dressing/Undressing Upper body dressing   What is the patient wearing?: Button up shirt    Upper body assist Assist Level: Moderate Assistance - Patient 50 -  74%    Lower Body Dressing/Undressing Lower body dressing      What is the patient wearing?: Pants     Lower body assist Assist for lower body dressing: Total Assistance - Patient < 25%     Toileting Toileting    Toileting assist Assist for toileting: 2 Helpers     Transfers Chair/bed transfer  Transfers assist     Chair/bed transfer assist level: Maximal Assistance - Patient 25 - 49%     Locomotion Ambulation   Ambulation assist   Ambulation activity did not occur: Safety/medical concerns (weakness, pain, decreased balance)          Walk 10 feet activity   Assist  Walk 10 feet activity did not occur: Safety/medical concerns (weakness, pain, decreased balance)        Walk 50 feet activity   Assist Walk 50 feet with 2 turns activity did not occur: Safety/medical concerns (weakness, pain, decreased balance)         Walk 150 feet activity   Assist Walk 150 feet activity did not occur: Safety/medical concerns (weakness, pain, decreased balance)         Walk 10 feet on  uneven surface  activity   Assist Walk 10 feet on uneven surfaces activity did not occur: Safety/medical concerns (weakness, pain, decreased balance)         Wheelchair     Assist Is the patient using a wheelchair?: Yes Type of Wheelchair: Manual    Wheelchair assist level: Supervision/Verbal cueing Max wheelchair distance: 49'    Wheelchair 50 feet with 2 turns activity    Assist        Assist Level: Supervision/Verbal cueing   Wheelchair 150 feet activity     Assist      Assist Level: Dependent - Patient 0%   Blood pressure (!) 127/56, pulse 84, temperature 97.9 F (36.6 C), resp. rate 16, height 5\' 1"  (1.549 m), weight 90.3 kg, SpO2 97%.  Medical Problem List and Plan: 1. 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  OK to stop continuous penicillin G for showers.             -ELOS/Goals: 9-12 days, SPV PT/OT - 4/25 DC date             -Continue CIR   - 4/8: Max a bed mobility, STEDY - very fearful of falling. She did some marching at EOB today with the walker. Min A short distances goals. Mod A UB Max A LBD and toiletting; really limited by pain and ROM in her right arm.    2.  Antithrombotics: -DVT/anticoagulation:  Mechanical: Sequential compression devices, below knee Bilateral lower extremities  -01/13/24 DVT US neg              -antiplatelet therapy: N/A 3. Acute on chronic pain/Pain Management:  Was on Oxycodone 5 mg and Lyrica 75 mg daily PTA.  --Having surgical pain--continue Oxycodone prn. Pain worse at nights-->will schedule oxycodone at bedtime.  -Voltaren gel QID -4/7: DC morphine due to no utilization - 4/8: Oxy scheduled overnight and when taken in the a.m. causing some lethargy intermittently--patient has tolerated hydrocodone in the past, will switch to Norco  2.5 to 5 mg every 6 hours as needed.  Will  also schedule Tylenol 500 mg 3 times daily. - 4/9: LFTS up; DC scheduled tylenol. Add oxy to allergy list d/t confusion. Doing much better with Norco - 4/10: Having some a.m. confusion with p.m. Norco.  Has had issues with tramadol in the past.  Patient is already using rarely, advised to continue this regimen and wean off as tolerated.  4. Mood/Behavior/Sleep: LCSW to folow for evaluation and support.              -antipsychotic agents: N/A  -Cymbalta 60mg  nightly, mirapex 1mg  at bedtime, mirtazapine 30mg  nightly  -Patient reports sleeping well  5. Neuropsych/cognition: This patient is capable of making decisions on his own behalf.  4/8: Having increased confusion, staring episodes without new neurologic deficits.  Adjusting pain medications as above.  Has had issues with hallucinations/confusion with cefepime in the past, would consider antibiotic adjustment if alternative causes ruled out.  4/9: episodes resolved with pain medication changes  6. Skin/Wound Care: Routine pressure relief measures. Monitor incision for healing.   4/7: Neurosurgery removed Hemovac over the weekend, current VAC with no drainage, if no further output in 24 hours will discuss removal with Dr. Delorise Shiner removal in 2 days per Dr. Yetta Barre  Wound VAC removed 4-10- -4-11: Limited visualizations due to TLSO when out of bed, nursing to place picture of wound in chart today.  7. Fluids/Electrolytes/Nutrition: Monitor I/O. Routine labs  -01/12/24 CMP stable today, Cr down to 1.55, AST 44, improving, monitor  4-7: LFTs mildly up trending, BUN and creatinine down, mild hyponatremia.  Limit Tylenol to 3000 mg daily as above.  Repeat in 2 to 3 days.  4/9: LFTS continue up; NA 131. See below  8. Neuropathy/band like pain around waist: Will increase Lyrica to TID 9. Osteomyelitis/discitis. Pending PICC placement. -- Starting 6 weeks of daptomycin and ertapenem for 6 weeks through 02/20/24  to be followed by PO suppression doxycycline and ciprofloxacin.  -01/13/24 Cx NGTD x4d--> 1 of 2 cultures grew cutibacterium acnes--covered with current antibiotics 4-7: ESR added on to a.m. labs.  CRP up today; discussed with ID, no new orders. 4-10: Infectious disease changing ertapenem to Cipro due to LFT elevations as below.  Continue daptomycin; has had issues with vancomycin in the past 4-11: Daptomycin switched to continuous penicillin G yesterday afternoon.  Continuing Cipro.  Per infectious disease/pharmacy looking into insurance coverage for continuous penicillin G cassette versus switching to Rocephin prior to discharge.  If switching to Rocephin, asked to be informed a few days prior so we could trial it at rehab, given significant side effects to cefepime in the past (more likely related to accumulation with AKI - not true allergy).  10. HTN:  Monitor BP TID--on torsemide 40mg  daily and coreg 6.125mg  BID (not reordered?). -4/5-6/25 BPs mostly fine, coreg not ordered as of now; remain off for now 4-7: Systolic low, p.o. intakes minimal.  Reduce torsemide for tomorrow a.m. to 20 mg, and encourage p.o. fluids today. 4-8: Diastolic remains low, will DC torsemide.  Schedule daily weights. 4/9: BP up a bit, continue to hold torsemide  4-10: Increased lower extremity edema, remains with diastolic hypotension but wishes to resume home torsemide; will resume at 20 mg daily.  Continue teds 4-11: Peripheral edema improved, patient tolerating well.  Continue current medications.  Vitals:   01/14/24 0505 01/14/24 1427 01/15/24 0429 01/15/24 1518  BP: (!) 129/55 (!) 125/56 (!) 140/59 (!) 114/56   01/15/24 1926 01/16/24 0520 01/16/24 1445 01/16/24 2026  BP: (!) 130/52 Marland Kitchen)  158/84 (!) 118/56 (!) 128/49   01/17/24 0524 01/17/24 1414 01/17/24 2026 01/18/24 0421  BP: 133/66 135/62 (!) 111/50 (!) 127/56    11. T2DM: Hgb A1C-7.9. Used [name] 50-120/ 5 units ac meal TID at home.              --Continue  insulin glargine 20 units daily with SSI.  -4/5-6/25 CBGs a bit better this weekend, monitor trend but may need further adjustments since they're still mostly >200 4/7: Blood sugars remain elevated, increase glargine to 25 units daily starting tomorrow a.m. 4/11: Blood sugars uptrending with improved p.o.; resume home standing short acting insulin, started 3 units 3 times daily WC CBG (last 3)  Recent Labs    01/17/24 2218 01/18/24 0630 01/18/24 1143  GLUCAP 271* 187* 222*    12.  Acute on CKD stage III--baseline creatinine 1.4: SCr has had rise to 1.56-->will d/c celebrex.  -01/12/24 Cr 1.55 today, down from 1.86 yesterday, monitor Monday  4-7, 4/9: BUN, creatinine improving.  4-11: BUN stable, creatinine slightly up to 1.26; monitor on Monday, may need to DC naprosyn if up trended  13. Fever blisters: Will start valtrex 1g BID x1wk as symptomatic. Improving, EOT 4/11 14. Right shoulder pain: Question strain but heard a pop this am and extremely concerned that she may have torn her RTC again --Voltaren gel for local measures.  -- Consider MRI if no improvement 4/8: R shoulder remains very painful/limited - will get x-ray and consider MRI if no acute findings. 4/9: Xray with mild changes; discussed with patient, unable to get MRI due to spinal cord stimulator leads, will order CT 4/10: CT nondiagnostic, some changes "suggestive of rotator cuff tear", mild before meals and glenohumeral degenerative changes.  Consulted orthopedics, appreciate Earney Hamburg PA-C's evaluation, recommending no intervention at this time, added naproxen 500 mg twice daily.   15. Constipation: Continue Senna 2 tabs daily--miralax was added on 04/04 -01/12/24 LBM yesterday, didn't get the 32oz miralax so d/c'd. Continue miralax 17g daily for now.  -01/13/24 LBM last night, cont regimen as is for now, adjust accordingly 4-7: Increase Senokot-S to 2 tabs twice daily, MiraLAX BID 4/11: large BM   16.  Depression/Anxiety: Continue Cymbalta 60mg  nightly and Mirtazapine 30mg  nightly.   17. Neurogenic bladder: Continue foley. D/c in am if has results with laxative tonight. -01/12/24 had BM last night, will pull foley, do timed toileting and PVRs q4-6h, ISC if PVR >329ml -01/13/24 urinating well, low PVRs, continue checking but could d/c tomorrow if still urinating fine.  4-7: Single episode of incontinence overnight, with PVR 170s.  Otherwise continent.  Continue current regimen.  18. ABLA: Hgb 10.5 01/12/24, monitor  -4-7: Hemoglobin down from 10.5-9.4; no overt signs of bleeding, repeat in 1 to 2 days  4-9: Hemoglobin stable, 8.8  19. GERD: reordered Protonix 40mg  daily  -4-10: Increase Protonix to 40 mg twice daily for GI prophylaxis with antibiotics and now naprosyn  20.  Transaminitis.  Uptrending on 4-7 labs.  -May be stasis related due to poor mobility; Tylenol utilization within parameters  -Repeat in 2 days  4/9: Increasing. DC standing tylenol. Get liver US today, + GGT. Per pharmacy medications unlikely contributors.   4-10: Liver ultrasound without acute findings.  Continues to uptrend; discussed with pharmacy, infectious disease, feel this is close enough to her baseline elevations but no concern but will change ertapenem to Cipro.  4/11:  LFTs have stabilized.  Monitor.  21. Hyponatremia.   - ?etiology; possible  with abx but unlikely   - Holding torsemide as above   - Add 1200 cc fluid restriciton   - Serum OSM + urine OSM, NA 4/9   - repeat in AM  4-10: Improved today, 132.  Will need to monitor closely with resumption of torsemide.  Add labs in a.m.  4-11: NA stable, 132.  LOS: 7 days A FACE TO FACE EVALUATION WAS PERFORMED  [name] 01/18/2024, 2:45 PM

## 2024-01-19 DIAGNOSIS — I10 Essential (primary) hypertension: Secondary | ICD-10-CM | POA: Diagnosis not present

## 2024-01-19 DIAGNOSIS — M4714 Other spondylosis with myelopathy, thoracic region: Secondary | ICD-10-CM | POA: Diagnosis not present

## 2024-01-19 DIAGNOSIS — R739 Hyperglycemia, unspecified: Secondary | ICD-10-CM | POA: Diagnosis not present

## 2024-01-19 DIAGNOSIS — K5901 Slow transit constipation: Secondary | ICD-10-CM | POA: Diagnosis not present

## 2024-01-19 DIAGNOSIS — N183 Chronic kidney disease, stage 3 unspecified: Secondary | ICD-10-CM

## 2024-01-19 LAB — COMPREHENSIVE METABOLIC PANEL WITH GFR
ALT: 57 U/L — ABNORMAL HIGH (ref 0–44)
AST: 75 U/L — ABNORMAL HIGH (ref 15–41)
Albumin: 2.1 g/dL — ABNORMAL LOW (ref 3.5–5.0)
Alkaline Phosphatase: 287 U/L — ABNORMAL HIGH (ref 38–126)
Anion gap: 10 (ref 5–15)
BUN: 24 mg/dL — ABNORMAL HIGH (ref 8–23)
CO2: 26 mmol/L (ref 22–32)
Calcium: 8.3 mg/dL — ABNORMAL LOW (ref 8.9–10.3)
Chloride: 97 mmol/L — ABNORMAL LOW (ref 98–111)
Creatinine, Ser: 1.34 mg/dL — ABNORMAL HIGH (ref 0.44–1.00)
GFR, Estimated: 44 mL/min — ABNORMAL LOW (ref >60)
Glucose, Bld: 217 mg/dL — ABNORMAL HIGH (ref 70–99)
Potassium: 4.3 mmol/L (ref 3.5–5.1)
Sodium: 133 mmol/L — ABNORMAL LOW (ref 135–145)
Total Bilirubin: 0.5 mg/dL (ref 0.0–1.2)
Total Protein: 5.3 g/dL — ABNORMAL LOW (ref 6.5–8.1)

## 2024-01-19 LAB — GLUCOSE, CAPILLARY
Glucose-Capillary: 197 mg/dL — ABNORMAL HIGH (ref 70–99)
Glucose-Capillary: 214 mg/dL — ABNORMAL HIGH (ref 70–99)
Glucose-Capillary: 162 mg/dL — ABNORMAL HIGH (ref 70–99)
Glucose-Capillary: 177 mg/dL — ABNORMAL HIGH (ref 70–99)

## 2024-01-19 MED ORDER — NAPROXEN 250 MG PO TABS
250.0000 mg | ORAL_TABLET | Freq: Two times a day (BID) | ORAL | Status: DC
  Administered 2024-01-19 – 2024-01-21 (×5): 250 mg via ORAL
  Filled 2024-01-19 (×6): qty 1

## 2024-01-19 NOTE — Progress Notes (Signed)
    [facility]  Date of Admission:  01/11/2024     Reason for Follow Up: Thoracic myelopathy          BRIEF PROGRESS NOTE:  Diagnosis: C. Acnes and Epidural abscess/discitis   Allergies  Allergen Reactions   Atorvastatin Nausea And Vomiting and Other (See Comments)    MYALGIAS   Oxycodone Other (See Comments)    Confusion, staring episodes   Talwin [Pentazocine] Other (See Comments)    headache   Cefepime Other (See Comments)    Presumed AMS/neurotoxicity in the setting of AKI   Gabapentin Swelling   Other Nausea Only    UNSPECIFIED Anesthesia    OPAT Orders Discharge antibiotics to be given via PICC line Discharge antibiotics: Ceftriaxone 2 g IV q 24 hours and oral ciprofloxacin Per pharmacy protocol  Aim for Vancomycin trough 15-20 or AUC 400-550 (unless otherwise indicated) Duration: 6 weeks End Date: 02/20/23  [facility] Care Per Protocol:  Home health RN for IV administration and teaching; PICC line care and labs.    Labs weekly while on IV antibiotics: _X_ CBC with differential __ BMP _X_ CMP _X_ CRP _X_ ESR __ Vancomycin trough __ CK  __ Please pull PIC at completion of IV antibiotics _X_ Please leave PIC in place until doctor has seen patient or been notified  Fax weekly labs to [phone]  Clinic Follow Up Appt:  01/28/24 at 3pm with Greg Whit Bruni, NP   Marlan Silva, NP [facility]  01/19/2024  5:23 PM

## 2024-01-19 NOTE — Progress Notes (Signed)
 PROGRESS NOTE   Subjective/Complaints:  Pt doing well, slept well, pain well managed, LBM yesterday, urinating fine. Denies any other complaints or concerns.   ROS: as per HPI. Denies CP, SOB, abd pain, N/V/D/C, or any other complaints at this time.   + confusion--improved, associated with pain medication + R shoulder pain--improved  Objective:   No results found.   Recent Labs    01/17/24 0302 01/18/24 0347  WBC 4.7 3.7*  HGB 8.8* 8.1*  HCT 27.0* 24.7*  PLT 117* 111*   Recent Labs    01/18/24 0347 01/19/24 0348  NA 132* 133*  K 4.0 4.3  CL 98 97*  CO2 25 26  GLUCOSE 195* 217*  BUN 22 24*  CREATININE 1.26* 1.34*  CALCIUM 8.1* 8.3*      Intake/Output Summary (Last 24 hours) at 01/19/2024 1155 Last data filed at 01/19/2024 0800 Gross per 24 hour  Intake 1622.16 ml  Output --  Net 1622.16 ml        Physical Exam: Vital Signs Blood pressure (!) 134/58, pulse 92, temperature 97.7 F (36.5 C), temperature source Oral, resp. rate 18, height 5\' 1"  (1.549 m), weight 90.1 kg, SpO2 95%.  Constitutional: No apparent distress. Appropriate appearance for age. +Obese.  Sitting up in bed HENT: No JVD. Neck Supple. Trachea midline.  Lip swelling resolved Eyes: PERRLA. EOMI. Cardiovascular: RRR, no murmurs/rub/gallops.  1+ bilateral LE Edema. Peripheral pulses 2+  Respiratory: CTAB. No rales, rhonchi, or wheezing. On RA.  Abdomen: + bowel sounds, normoactive. No distention or tenderness.  Skin: PICC line with minimal bruising, intact.  Patient in TLSO, back wound not visualized  PRIOR EXAMS: MSK:      No apparent deformity. + RUE shoulder abduction/flexion limited to <50 degrees--unchanged        Neurologic exam:  Cognition: AAO to person, place, time and event.  Language: Fluent, No substitutions or neoglisms. No dysarthria.  Memory: No apparent deficits  Insight: Good insight into current condition.   Mood: Pleasant affect, appropriate mood.   Sensation: To light touch reduced in distal fingertips and toes bilaterally; sensation improving at bilateral knees   Reflexes: 2+ in BL UE; hyporeflexic BL LE. No babinski. CN: 2-12 grossly intact.  Coordination: No apparent tremors. No ataxia on FTN bilaterally.  Spasticity: MAS 0 in all extremities.       Strength:                RUE: 4-/5 SA, 5/5 EF, 5/5 EE, 5/5 WE, 5/5 FF, 5/5 FA                LUE:  5/5 SA, 5/5 EF, 5/5 EE, 5/5 WE, 5/5 FF, 5/5 FA                RLE: 3+/5 HF, 4-/5 KE, 3-/5  DF, 2/5  EHL, 3/5  PF                 LLE:  3-/5 HF, 4-/5 KE, 5-/5  DF, 5-/5  EHL, 5-/5  PF    Assessment/Plan: 1. Functional deficits which require 3+ hours per day of interdisciplinary therapy in a comprehensive inpatient rehab setting. Physiatrist  is providing close team supervision and 24 hour management of active medical problems listed below. Physiatrist and rehab team continue to assess barriers to discharge/monitor patient progress toward functional and medical goals  Care Tool:  Bathing    Body parts bathed by patient: Right arm, Chest, Abdomen, Right upper leg, Left upper leg, Face   Body parts bathed by helper: Left arm, Front perineal area, Buttocks, Right lower leg, Left lower leg     Bathing assist Assist Level: Moderate Assistance - Patient 50 - 74%     Upper Body Dressing/Undressing Upper body dressing   What is the patient wearing?: Button up shirt    Upper body assist Assist Level: Moderate Assistance - Patient 50 - 74%    Lower Body Dressing/Undressing Lower body dressing      What is the patient wearing?: Pants     Lower body assist Assist for lower body dressing: Total Assistance - Patient < 25%     Toileting Toileting    Toileting assist Assist for toileting: 2 Helpers     Transfers Chair/bed transfer  Transfers assist     Chair/bed transfer assist level: Maximal Assistance - Patient 25 - 49%      Locomotion Ambulation   Ambulation assist   Ambulation activity did not occur: Safety/medical concerns (weakness, pain, decreased balance)          Walk 10 feet activity   Assist  Walk 10 feet activity did not occur: Safety/medical concerns (weakness, pain, decreased balance)        Walk 50 feet activity   Assist Walk 50 feet with 2 turns activity did not occur: Safety/medical concerns (weakness, pain, decreased balance)         Walk 150 feet activity   Assist Walk 150 feet activity did not occur: Safety/medical concerns (weakness, pain, decreased balance)         Walk 10 feet on uneven surface  activity   Assist Walk 10 feet on uneven surfaces activity did not occur: Safety/medical concerns (weakness, pain, decreased balance)         Wheelchair     Assist Is the patient using a wheelchair?: Yes Type of Wheelchair: Manual    Wheelchair assist level: Supervision/Verbal cueing Max wheelchair distance: 59'    Wheelchair 50 feet with 2 turns activity    Assist        Assist Level: Supervision/Verbal cueing   Wheelchair 150 feet activity     Assist      Assist Level: Dependent - Patient 0%   Blood pressure (!) 134/58, pulse 92, temperature 97.7 F (36.5 C), temperature source Oral, resp. rate 18, height 5\' 1"  (1.549 m), weight 90.1 kg, SpO2 95%.  Medical Problem List and Plan: 1. 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  OK to stop continuous penicillin G for showers.             -ELOS/Goals: 9-12 days, [facility] PT/OT - 4/25 DC date             -Continue CIR   - 4/8: Max a bed mobility, STEDY - very fearful of falling. She did some marching at EOB today with the walker. Min A short distances goals. Mod A UB Max A LBD and toiletting; really  limited by pain  and ROM in her right arm.    2.  Antithrombotics: -DVT/anticoagulation:  Mechanical: Sequential compression devices, below knee Bilateral lower extremities  -01/13/24 DVT US neg              -antiplatelet therapy: N/A 3. Acute on chronic pain/Pain Management:  Was on Oxycodone 5 mg and Lyrica 75 mg daily PTA.  --Having surgical pain--continue Oxycodone prn. Pain worse at nights-->will schedule oxycodone at bedtime.  -Voltaren gel QID -4/7: DC morphine due to no utilization - 4/8: Oxy scheduled overnight and when taken in the a.m. causing some lethargy intermittently--patient has tolerated hydrocodone in the past, will switch to Norco 2.5 to 5 mg every 6 hours as needed.  Will also schedule Tylenol 500 mg 3 times daily. - 4/9: LFTS up; DC scheduled tylenol. Add oxy to allergy list d/t confusion. Doing much better with Norco - 4/10: Having some a.m. confusion with p.m. Norco.  Has had issues with tramadol in the past.  Patient is already using rarely, advised to continue this regimen and wean off as tolerated.  4. Mood/Behavior/Sleep: LCSW to folow for evaluation and support.              -antipsychotic agents: N/A  -Cymbalta 60mg  nightly, mirapex 1mg  at bedtime, mirtazapine 30mg  nightly  -Patient reports sleeping well  5. Neuropsych/cognition: This patient is capable of making decisions on his own behalf.  4/8: Having increased confusion, staring episodes without new neurologic deficits.  Adjusting pain medications as above.  Has had issues with hallucinations/confusion with cefepime in the past, would consider antibiotic adjustment if alternative causes ruled out.  4/9: episodes resolved with pain medication changes  6. Skin/Wound Care: Routine pressure relief measures. Monitor incision for healing.   4/7: Neurosurgery removed Hemovac over the weekend, current VAC with no drainage, if no further output in 24 hours will discuss removal with Dr. Delorise Shiner removal in 2 days per Dr. Yetta Barre  Wound  VAC removed 4-10- -4-11: Limited visualizations due to TLSO when out of bed, nursing to place picture of wound in chart today.  7. Fluids/Electrolytes/Nutrition: Monitor I/O. Routine labs  -01/12/24 CMP stable today, Cr down to 1.55, AST 44, improving, monitor  4-7: LFTs mildly up trending, BUN and creatinine down, mild hyponatremia.  Limit Tylenol to 3000 mg daily as above.  Repeat in 2 to 3 days.  4/9: LFTS continue up; NA 131. See below  8. Neuropathy/band like pain around waist: Will increase Lyrica to TID 9. Osteomyelitis/discitis. Pending PICC placement. -- Starting 6 weeks of daptomycin and ertapenem for 6 weeks through 02/20/24 to be followed by PO suppression doxycycline and ciprofloxacin.  -01/13/24 Cx NGTD x4d--> 1 of 2 cultures grew cutibacterium acnes--covered with current antibiotics 4-7: ESR added on to a.m. labs.  CRP up today; discussed with ID, no new orders. 4-10: Infectious disease changing ertapenem to Cipro due to LFT elevations as below.  Continue daptomycin; has had issues with vancomycin in the past 4-11: Daptomycin switched to continuous penicillin G yesterday afternoon.  Continuing Cipro.  Per infectious disease/pharmacy looking into insurance coverage for continuous penicillin G cassette versus switching to Rocephin prior to discharge.  If switching to Rocephin, asked to be informed a few days prior so we could trial it at rehab, given significant side effects to cefepime in the past (more likely related to accumulation with AKI - not true allergy).  10. HTN:  Monitor BP TID--on torsemide 40mg  daily and coreg 6.125mg  BID (not reordered?). -  4/5-6/25 BPs mostly fine, coreg not ordered as of now; remain off for now 4-7: Systolic low, p.o. intakes minimal.  Reduce torsemide for tomorrow a.m. to 20 mg, and encourage p.o. fluids today. 4-8: Diastolic remains low, will DC torsemide.  Schedule daily weights. 4/9: BP up a bit, continue to hold torsemide  4-10: Increased lower  extremity edema, remains with diastolic hypotension but wishes to resume home torsemide; will resume at 20 mg daily.  Continue teds 4-11: Peripheral edema improved, patient tolerating well.  Continue current medications. -01/19/24 BPs great, monitor  Vitals:   01/15/24 1518 01/15/24 1926 01/16/24 0520 01/16/24 1445  BP: (!) 114/56 (!) 130/52 (!) 158/84 (!) 118/56   01/16/24 2026 01/17/24 0524 01/17/24 1414 01/17/24 2026  BP: (!) 128/49 133/66 135/62 (!) 111/50   01/18/24 0421 01/18/24 1550 01/18/24 1945 01/19/24 0431  BP: (!) 127/56 (!) 132/58 (!) 126/53 (!) 134/58    11. T2DM: Hgb A1C-7.9. Used [name] 50-120/ 5 units ac meal TID at home.              --Continue insulin glargine 20 units daily with SSI.  -4/5-6/25 CBGs a bit better this weekend, monitor trend but may need further adjustments since they're still mostly >200 4/7: Blood sugars remain elevated, increase glargine to 25 units daily starting tomorrow a.m. 4/11: Blood sugars uptrending with improved p.o.; resume home standing short acting insulin, started 3 units 3 times daily WC -01/19/24 CBGs a little better today, monitor with recent change CBG (last 3)  Recent Labs    01/18/24 2111 01/19/24 0546 01/19/24 1120  GLUCAP 203* 197* 214*    12.  Acute on CKD stage III--baseline creatinine 1.4: SCr has had rise to 1.56-->will d/c celebrex.  -01/12/24 Cr 1.55 today, down from 1.86 yesterday, monitor Monday  4-7, 4/9: BUN, creatinine improving.  4-11: BUN stable, creatinine slightly up to 1.26; monitor on Monday, may need to DC naprosyn if up trended  -01/19/24 Cr up to 1.34, still better than prior and near her baseline; will decrease naprosyn to 250mg  BID for now, monitor on Monday labs.   13. Fever blisters: Will start valtrex 1g BID x1wk as symptomatic. Improving, EOT 4/11 14. Right shoulder pain: Question strain but heard a pop this am and extremely concerned that she may have torn her RTC again --Voltaren gel for local  measures.  -- Consider MRI if no improvement 4/8: R shoulder remains very painful/limited - will get x-ray and consider MRI if no acute findings. 4/9: Xray with mild changes; discussed with patient, unable to get MRI due to spinal cord stimulator leads, will order CT 4/10: CT nondiagnostic, some changes "suggestive of rotator cuff tear", mild before meals and glenohumeral degenerative changes.  Consulted orthopedics, appreciate Earney Hamburg PA-C's evaluation, recommending no intervention at this time, added naproxen 500 mg twice daily.   15. Constipation: Continue Senna 2 tabs daily--miralax was added on 04/04 -01/12/24 LBM yesterday, didn't get the 32oz miralax so d/c'd. Continue miralax 17g daily for now.  -01/13/24 LBM last night, cont regimen as is for now, adjust accordingly 4-7: Increase Senokot-S to 2 tabs twice daily, MiraLAX BID 01/19/24 LBM yesterday, cont regimen  16. Depression/Anxiety: Continue Cymbalta 60mg  nightly and Mirtazapine 30mg  nightly.   17. Neurogenic bladder: Continue foley. D/c in am if has results with laxative tonight. -01/12/24 had BM last night, will pull foley, do timed toileting and PVRs q4-6h, ISC if PVR >351ml -01/13/24 urinating well, low PVRs, continue checking but could d/c tomorrow  if still urinating fine.  4-7: Single episode of incontinence overnight, with PVR 170s.  Otherwise continent.  Continue current regimen.  18. ABLA: Hgb 10.5 01/12/24, monitor  -4-7: Hemoglobin down from 10.5-9.4; no overt signs of bleeding, repeat in 1 to 2 days  4-9: Hemoglobin stable, 8.8  19. GERD: reordered Protonix 40mg  daily  -4-10: Increase Protonix to 40 mg twice daily for GI prophylaxis with antibiotics and now naprosyn  20.  Transaminitis.  Uptrending on 4-7 labs.  -May be stasis related due to poor mobility; Tylenol utilization within parameters  -Repeat in 2 days  4/9: Increasing. DC standing tylenol. Get liver US  today, + GGT. Per pharmacy medications unlikely  contributors.   4-10: Liver ultrasound without acute findings.  Continues to uptrend; discussed with pharmacy, infectious disease, feel this is close enough to her baseline elevations but no concern but will change ertapenem to Cipro.  4/11:  LFTs have stabilized.  Monitor.  -01/19/24 LFTs fairly stable overall, will change CMP to Monday instead of daily through the weekend.   21. Hyponatremia.   - ?etiology; possible with abx but unlikely   - Holding torsemide as above   - Add 1200 cc fluid restriciton   - Serum OSM + urine OSM, NA 4/9   - repeat in AM  4-10: Improved today, 132.  Will need to monitor closely with resumption of torsemide.  Add labs in a.m.  01/19/24 stable 133  LOS: 8 days A FACE TO FACE EVALUATION WAS PERFORMED  [address] 01/19/2024, 11:55 AM

## 2024-01-20 DIAGNOSIS — R739 Hyperglycemia, unspecified: Secondary | ICD-10-CM | POA: Diagnosis not present

## 2024-01-20 DIAGNOSIS — I10 Essential (primary) hypertension: Secondary | ICD-10-CM | POA: Diagnosis not present

## 2024-01-20 DIAGNOSIS — M4714 Other spondylosis with myelopathy, thoracic region: Secondary | ICD-10-CM | POA: Diagnosis not present

## 2024-01-20 DIAGNOSIS — K5901 Slow transit constipation: Secondary | ICD-10-CM | POA: Diagnosis not present

## 2024-01-20 LAB — GLUCOSE, CAPILLARY
Glucose-Capillary: 171 mg/dL — ABNORMAL HIGH (ref 70–99)
Glucose-Capillary: 201 mg/dL — ABNORMAL HIGH (ref 70–99)
Glucose-Capillary: 228 mg/dL — ABNORMAL HIGH (ref 70–99)
Glucose-Capillary: 163 mg/dL — ABNORMAL HIGH (ref 70–99)

## 2024-01-20 NOTE — Plan of Care (Signed)
  Problem: Consults Goal: RH SPINAL CORD INJURY PATIENT EDUCATION Description:  See Patient Education module for education specifics.  Outcome: Progressing Goal: Skin Care Protocol Initiated - if Braden Score 18 or less Description: If consults are not indicated, leave blank or document N/A Outcome: Progressing Goal: Nutrition Consult-if indicated Outcome: Progressing Goal: Diabetes Guidelines if Diabetic/Glucose > 140 Description: If diabetic or lab glucose is > 140 mg/dl - Initiate Diabetes/Hyperglycemia Guidelines & Document Interventions  Outcome: Progressing   Problem: SCI BOWEL ELIMINATION Goal: RH STG MANAGE BOWEL WITH ASSISTANCE Description: STG Manage Bowel with minimal  Assistance. Outcome: Progressing Goal: RH STG SCI MANAGE BOWEL WITH MEDICATION WITH ASSISTANCE Description: STG SCI Manage bowel with medication with minimal assistance. Outcome: Progressing   Problem: SCI BLADDER ELIMINATION Goal: RH STG MANAGE BLADDER WITH ASSISTANCE Description: STG Manage Bladder With minimal Assistance Outcome: Progressing Goal: RH STG MANAGE BLADDER WITH MEDICATION WITH ASSISTANCE Description: STG Manage Bladder With Medication With minimal Assistance. Outcome: Progressing   Problem: RH SKIN INTEGRITY Goal: RH STG SKIN FREE OF INFECTION/BREAKDOWN Outcome: Progressing Goal: RH STG MAINTAIN SKIN INTEGRITY WITH ASSISTANCE Description: STG Maintain Skin Integrity With Assistance. Outcome: Progressing Goal: RH STG ABLE TO PERFORM INCISION/WOUND CARE W/ASSISTANCE Description: STG Able To Perform Incision/Wound Care With Assistance. Outcome: Progressing   Problem: RH SAFETY Goal: RH STG ADHERE TO SAFETY PRECAUTIONS W/ASSISTANCE/DEVICE Description: STG Adhere to Safety Precautions With minimal Assistance/Device. Outcome: Progressing Goal: RH STG DECREASED RISK OF FALL WITH ASSISTANCE Description: STG Decreased Risk of Fall With minimal Assistance. Outcome: Progressing   Problem:  RH PAIN MANAGEMENT Goal: RH STG PAIN MANAGED AT OR BELOW PT'S PAIN GOAL Description: <4 w/ prns Outcome: Progressing   Problem: Consults Goal: RH SPINAL CORD INJURY PATIENT EDUCATION Description:  See Patient Education module for education specifics.  Outcome: Progressing   Problem: RH KNOWLEDGE DEFICIT SCI Goal: RH STG INCREASE KNOWLEDGE OF SELF CARE AFTER SCI Outcome: Progressing   Problem: SCI BOWEL ELIMINATION Goal: RH STG MANAGE BOWEL WITH ASSISTANCE Description: STG Manage Bowel with Assistance. Outcome: Progressing   Problem: SCI BLADDER ELIMINATION Goal: RH STG MANAGE BLADDER WITH ASSISTANCE Description: STG Manage Bladder With Assistance Outcome: Progressing   Problem: RH SKIN INTEGRITY Goal: RH STG SKIN FREE OF INFECTION/BREAKDOWN Outcome: Progressing   Problem: RH SAFETY Goal: RH STG ADHERE TO SAFETY PRECAUTIONS W/ASSISTANCE/DEVICE Description: STG Adhere to Safety Precautions With minimal  Assistance/Device. Outcome: Progressing   Problem: RH PAIN MANAGEMENT Goal: RH STG PAIN MANAGED AT OR BELOW PT'S PAIN GOAL Outcome: Progressing   Problem: RH KNOWLEDGE DEFICIT SCI Goal: RH STG INCREASE KNOWLEDGE OF SELF CARE AFTER SCI Description: Manage increase knowledge of self care \\after  SCI  SCI with minimal assistance  using educational materials provided Outcome: Progressing

## 2024-01-20 NOTE — Progress Notes (Signed)
 PROGRESS NOTE   Subjective/Complaints:  Pt doing well again this morning, slept well, pain well managed overall, LBM yesterday very small but had a larger one 2 days ago, urinating fine. Denies any other complaints or concerns.   ROS: as per HPI. Denies CP, SOB, abd pain, N/V/D/C, or any other complaints at this time.   + confusion--improved, associated with pain medication + R shoulder pain--improved  Objective:   No results found.   Recent Labs    01/18/24 0347  WBC 3.7*  HGB 8.1*  HCT 24.7*  PLT 111*   Recent Labs    01/18/24 0347 01/19/24 0348  NA 132* 133*  K 4.0 4.3  CL 98 97*  CO2 25 26  GLUCOSE 195* 217*  BUN 22 24*  CREATININE 1.26* 1.34*  CALCIUM 8.1* 8.3*      Intake/Output Summary (Last 24 hours) at 01/20/2024 0944 Last data filed at 01/19/2024 2057 Gross per 24 hour  Intake 580 ml  Output --  Net 580 ml        Physical Exam: Vital Signs Blood pressure 134/62, pulse 90, temperature (!) 97.2 F (36.2 C), temperature source Oral, resp. rate 18, height 5\' 1"  (1.549 m), weight 90.5 kg, SpO2 96%.  Constitutional: No apparent distress. Appropriate appearance for age. +Obese.  Sitting up in w/c getting dressed. HENT: No JVD. Neck Supple. Trachea midline.  Lip swelling resolved Eyes: PERRLA. EOMI. Cardiovascular: RRR, no murmurs/rub/gallops.  1+ bilateral LE Edema. Peripheral pulses 2+  Respiratory: CTAB. No rales, rhonchi, or wheezing. On RA.  Abdomen: + bowel sounds, normoactive. No distention or tenderness.  Skin: PICC line with minimal bruising, intact. Back incision covered, dressing c/d/i   PRIOR EXAMS: MSK:      No apparent deformity. + RUE shoulder abduction/flexion limited to <50 degrees--unchanged        Neurologic exam:  Cognition: AAO to person, place, time and event.  Language: Fluent, No substitutions or neoglisms. No dysarthria.  Memory: No apparent deficits  Insight: Good  insight into current condition.  Mood: Pleasant affect, appropriate mood.   Sensation: To light touch reduced in distal fingertips and toes bilaterally; sensation improving at bilateral knees   Reflexes: 2+ in BL UE; hyporeflexic BL LE. No babinski. CN: 2-12 grossly intact.  Coordination: No apparent tremors. No ataxia on FTN bilaterally.  Spasticity: MAS 0 in all extremities.       Strength:                RUE: 4-/5 SA, 5/5 EF, 5/5 EE, 5/5 WE, 5/5 FF, 5/5 FA                LUE:  5/5 SA, 5/5 EF, 5/5 EE, 5/5 WE, 5/5 FF, 5/5 FA                RLE: 3+/5 HF, 4-/5 KE, 3-/5  DF, 2/5  EHL, 3/5  PF                 LLE:  3-/5 HF, 4-/5 KE, 5-/5  DF, 5-/5  EHL, 5-/5  PF    Assessment/Plan: 1. Functional deficits which require 3+ hours per day of interdisciplinary  therapy in a comprehensive inpatient rehab setting. Physiatrist is providing close team supervision and 24 hour management of active medical problems listed below. Physiatrist and rehab team continue to assess barriers to discharge/monitor patient progress toward functional and medical goals  Care Tool:  Bathing    Body parts bathed by patient: Right arm, Chest, Abdomen, Right upper leg, Left upper leg, Face   Body parts bathed by helper: Left arm, Front perineal area, Buttocks, Right lower leg, Left lower leg     Bathing assist Assist Level: Moderate Assistance - Patient 50 - 74%     Upper Body Dressing/Undressing Upper body dressing   What is the patient wearing?: Button up shirt    Upper body assist Assist Level: Moderate Assistance - Patient 50 - 74%    Lower Body Dressing/Undressing Lower body dressing      What is the patient wearing?: Pants     Lower body assist Assist for lower body dressing: Total Assistance - Patient < 25%     Toileting Toileting    Toileting assist Assist for toileting: 2 Helpers     Transfers Chair/bed transfer  Transfers assist     Chair/bed transfer assist level: Maximal  Assistance - Patient 25 - 49%     Locomotion Ambulation   Ambulation assist   Ambulation activity did not occur: Safety/medical concerns (weakness, pain, decreased balance)          Walk 10 feet activity   Assist  Walk 10 feet activity did not occur: Safety/medical concerns (weakness, pain, decreased balance)        Walk 50 feet activity   Assist Walk 50 feet with 2 turns activity did not occur: Safety/medical concerns (weakness, pain, decreased balance)         Walk 150 feet activity   Assist Walk 150 feet activity did not occur: Safety/medical concerns (weakness, pain, decreased balance)         Walk 10 feet on uneven surface  activity   Assist Walk 10 feet on uneven surfaces activity did not occur: Safety/medical concerns (weakness, pain, decreased balance)         Wheelchair     Assist Is the patient using a wheelchair?: Yes Type of Wheelchair: Manual    Wheelchair assist level: Supervision/Verbal cueing Max wheelchair distance: 75'    Wheelchair 50 feet with 2 turns activity    Assist        Assist Level: Supervision/Verbal cueing   Wheelchair 150 feet activity     Assist      Assist Level: Dependent - Patient 0%   Blood pressure 134/62, pulse 90, temperature (!) 97.2 F (36.2 C), temperature source Oral, resp. rate 18, height 5\' 1"  (1.549 m), weight 90.5 kg, SpO2 96%.  Medical Problem List and Plan: 1. 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  OK to stop continuous penicillin G for showers.             -ELOS/Goals: 9-12 days, SPV PT/OT - 4/25 DC date             -Continue CIR   - 4/8: Max a bed mobility, STEDY - very fearful of falling. She did some marching at EOB today with the walker. Min A short distances goals. Mod  A UB Max A LBD and  toiletting; really limited by pain and ROM in her right arm.    2.  Antithrombotics: -DVT/anticoagulation:  Mechanical: Sequential compression devices, below knee Bilateral lower extremities  -01/13/24 DVT US neg              -antiplatelet therapy: N/A 3. Acute on chronic pain/Pain Management:  Was on Oxycodone 5 mg and Lyrica 75 mg daily PTA.  --Having surgical pain--continue Oxycodone prn. Pain worse at nights-->will schedule oxycodone at bedtime.  -Voltaren gel QID -4/7: DC morphine due to no utilization - 4/8: Oxy scheduled overnight and when taken in the a.m. causing some lethargy intermittently--patient has tolerated hydrocodone in the past, will switch to Norco 2.5 to 5 mg every 6 hours as needed.  Will also schedule Tylenol 500 mg 3 times daily. - 4/9: LFTS up; DC scheduled tylenol. Add oxy to allergy list d/t confusion. Doing much better with Norco - 4/10: Having some a.m. confusion with p.m. Norco.  Has had issues with tramadol in the past.  Patient is already using rarely, advised to continue this regimen and wean off as tolerated.  4. Mood/Behavior/Sleep: LCSW to folow for evaluation and support.              -antipsychotic agents: N/A  -Cymbalta 60mg  nightly, mirapex 1mg  at bedtime, mirtazapine 30mg  nightly  -Patient reports sleeping well  5. Neuropsych/cognition: This patient is capable of making decisions on his own behalf.  4/8: Having increased confusion, staring episodes without new neurologic deficits.  Adjusting pain medications as above.  Has had issues with hallucinations/confusion with cefepime in the past, would consider antibiotic adjustment if alternative causes ruled out.  4/9: episodes resolved with pain medication changes  6. Skin/Wound Care: Routine pressure relief measures. Monitor incision for healing.   4/7: Neurosurgery removed Hemovac over the weekend, current VAC with no drainage, if no further output in 24 hours will discuss removal with Dr. Delorise Shiner  removal in 2 days per Dr. Yetta Barre  Wound VAC removed 4-10- -4-11: Limited visualizations due to TLSO when out of bed, nursing to place picture of wound in chart today.  7. Fluids/Electrolytes/Nutrition: Monitor I/O. Routine labs  -01/12/24 CMP stable today, Cr down to 1.55, AST 44, improving, monitor  4-7: LFTs mildly up trending, BUN and creatinine down, mild hyponatremia.  Limit Tylenol to 3000 mg daily as above.  Repeat in 2 to 3 days.  4/9: LFTS continue up; NA 131. See below  8. Neuropathy/band like pain around waist: Will increase Lyrica to TID 9. Osteomyelitis/discitis. Pending PICC placement. -- Starting 6 weeks of daptomycin and ertapenem for 6 weeks through 02/20/24 to be followed by PO suppression doxycycline and ciprofloxacin.  -01/13/24 Cx NGTD x4d--> 1 of 2 cultures grew cutibacterium acnes--covered with current antibiotics 4-7: ESR added on to a.m. labs.  CRP up today; discussed with ID, no new orders. 4-10: Infectious disease changing ertapenem to Cipro due to LFT elevations as below.  Continue daptomycin; has had issues with vancomycin in the past 4-11: Daptomycin switched to continuous penicillin G yesterday afternoon.  Continuing Cipro.  Per infectious disease/pharmacy looking into insurance coverage for continuous penicillin G cassette versus switching to Rocephin prior to discharge.  If switching to Rocephin, asked to be informed a few days prior so we could trial it at rehab, given significant side effects to cefepime in the past (more likely related to accumulation with AKI - not true allergy).  10. HTN:  Monitor BP TID--on torsemide  40mg  daily and coreg 6.125mg  BID (not reordered?). -4/5-6/25 BPs mostly fine, coreg not ordered as of now; remain off for now 4-7: Systolic low, p.o. intakes minimal.  Reduce torsemide for tomorrow a.m. to 20 mg, and encourage p.o. fluids today. 4-8: Diastolic remains low, will DC torsemide.  Schedule daily weights. 4/9: BP up a bit, continue to hold  torsemide  4-10: Increased lower extremity edema, remains with diastolic hypotension but wishes to resume home torsemide; will resume at 20 mg daily.  Continue teds 4-11: Peripheral edema improved, patient tolerating well.  Continue current medications. -4/12-13/25 BPs great, monitor  Vitals:   01/16/24 1445 01/16/24 2026 01/17/24 0524 01/17/24 1414  BP: (!) 118/56 (!) 128/49 133/66 135/62   01/17/24 2026 01/18/24 0421 01/18/24 1550 01/18/24 1945  BP: (!) 111/50 (!) 127/56 (!) 132/58 (!) 126/53   01/19/24 0431 01/19/24 1303 01/19/24 2000 01/20/24 0507  BP: (!) 134/58 130/74 (!) 142/47 134/62    11. T2DM: Hgb A1C-7.9. Used [name] 50-120/ 5 units ac meal TID at home.              --Continue insulin glargine 20 units daily with SSI.  -4/5-6/25 CBGs a bit better this weekend, monitor trend but may need further adjustments since they're still mostly >200 4/7: Blood sugars remain elevated, increase glargine to 25 units daily starting tomorrow a.m. 4/11: Blood sugars uptrending with improved p.o.; resume home standing short acting insulin, started 3 units 3 times daily WC -01/19/24 CBGs a little better today, monitor with recent change -01/20/24 CBGs even better, cont regimen CBG (last 3)  Recent Labs    01/19/24 1629 01/19/24 2101 01/20/24 0553  GLUCAP 162* 177* 171*    12.  Acute on CKD stage III--baseline creatinine 1.4: SCr has had rise to 1.56-->will d/c celebrex.  -01/12/24 Cr 1.55 today, down from 1.86 yesterday, monitor Monday  4-7, 4/9: BUN, creatinine improving. 4-11: BUN stable, creatinine slightly up to 1.26; monitor on Monday, may need to DC naprosyn if up trended -01/19/24 Cr up to 1.34, still better than prior and near her baseline; will decrease naprosyn to 250mg  BID for now, monitor on Monday labs.   13. Fever blisters: Will start valtrex 1g BID x1wk as symptomatic. Improving, EOT 4/11 14. Right shoulder pain: Question strain but heard a pop this am and extremely concerned  that she may have torn her RTC again --Voltaren gel for local measures.  -- Consider MRI if no improvement 4/8: R shoulder remains very painful/limited - will get x-ray and consider MRI if no acute findings. 4/9: Xray with mild changes; discussed with patient, unable to get MRI due to spinal cord stimulator leads, will order CT 4/10: CT nondiagnostic, some changes "suggestive of rotator cuff tear", mild before meals and glenohumeral degenerative changes.  Consulted orthopedics, appreciate Earney Hamburg PA-C's evaluation, recommending no intervention at this time, added naproxen 500 mg twice daily.   15. Constipation: Continue Senna 2 tabs daily--miralax was added on 04/04 -01/12/24 LBM yesterday, didn't get the 32oz miralax so d/c'd. Continue miralax 17g daily for now.  -01/13/24 LBM last night, cont regimen as is for now, adjust accordingly 4-7: Increase Senokot-S to 2 tabs twice daily, MiraLAX BID 01/20/24 LBM yesterday, cont regimen  16. Depression/Anxiety: Continue Cymbalta 60mg  nightly and Mirtazapine 30mg  nightly.    17. Neurogenic bladder: Continue foley. D/c in am if has results with laxative tonight. -01/12/24 had BM last night, will pull foley, do timed toileting and PVRs q4-6h, ISC if PVR >337ml -  01/13/24 urinating well, low PVRs, continue checking but could d/c tomorrow if still urinating fine.  4-7: Single episode of incontinence overnight, with PVR 170s.  Otherwise continent.  Continue current regimen.  18. ABLA: Hgb 10.5 01/12/24, monitor  -4-7: Hemoglobin down from 10.5-9.4; no overt signs of bleeding, repeat in 1 to 2 days  4-9: Hemoglobin stable, 8.8 -01/20/24 Hgb 8.1 on 4/11 but up to 8.3 on 4/12; monitor tomorrow since still fairly stable.   19. GERD: reordered Protonix 40mg  daily -4-10: Increase Protonix to 40 mg twice daily for GI prophylaxis with antibiotics and now naprosyn  20.  Transaminitis.  Uptrending on 4-7 labs.  -May be stasis related due to poor mobility; Tylenol  utilization within parameters  -Repeat in 2 days  4/9: Increasing. DC standing tylenol. Get liver US  today, + GGT. Per pharmacy medications unlikely contributors.   4-10: Liver ultrasound without acute findings.  Continues to uptrend; discussed with pharmacy, infectious disease, feel this is close enough to her baseline elevations but no concern but will change ertapenem to Cipro.  4/11:  LFTs have stabilized.  Monitor.  -01/19/24 LFTs fairly stable overall, will change CMP to Monday instead of daily through the weekend.   21. Hyponatremia.   - ?etiology; possible with abx but unlikely   - Holding torsemide as above   - Add 1200 cc fluid restriciton   - Serum OSM + urine OSM, NA 4/9   - repeat in AM  4-10: Improved today, 132.  Will need to monitor closely with resumption of torsemide.  Add labs in a.m.  01/19/24 stable 133  LOS: 9 days A FACE TO FACE EVALUATION WAS PERFORMED  [id number] Tawona Filsinger 01/20/2024, 9:44 AM

## 2024-01-20 NOTE — Plan of Care (Signed)
 Problem: Consults Goal: RH SPINAL CORD INJURY PATIENT EDUCATION Description:  See Patient Education module for education specifics.  01/20/2024 0515 by Harlee Lichtenstein, RN Outcome: Progressing 01/20/2024 0514 by Harlee Lichtenstein, RN Outcome: Progressing Goal: Skin Care Protocol Initiated - if Braden Score 18 or less Description: If consults are not indicated, leave blank or document N/A 01/20/2024 0515 by Harlee Lichtenstein, RN Outcome: Progressing 01/20/2024 0514 by Harlee Lichtenstein, RN Outcome: Progressing Goal: Nutrition Consult-if indicated 01/20/2024 0515 by Harlee Lichtenstein, RN Outcome: Progressing 01/20/2024 0514 by Harlee Lichtenstein, RN Outcome: Progressing Goal: Diabetes Guidelines if Diabetic/Glucose > 140 Description: If diabetic or lab glucose is > 140 mg/dl - Initiate Diabetes/Hyperglycemia Guidelines & Document Interventions  01/20/2024 0515 by Harlee Lichtenstein, RN Outcome: Progressing 01/20/2024 0514 by Harlee Lichtenstein, RN Outcome: Progressing   Problem: SCI BOWEL ELIMINATION Goal: RH STG MANAGE BOWEL WITH ASSISTANCE Description: STG Manage Bowel with minimal  Assistance. 01/20/2024 0515 by Harlee Lichtenstein, RN Outcome: Progressing 01/20/2024 0514 by Harlee Lichtenstein, RN Outcome: Progressing Goal: RH STG SCI MANAGE BOWEL WITH MEDICATION WITH ASSISTANCE Description: STG SCI Manage bowel with medication with minimal assistance. 01/20/2024 0515 by Harlee Lichtenstein, RN Outcome: Progressing 01/20/2024 0514 by Harlee Lichtenstein, RN Outcome: Progressing   Problem: SCI BLADDER ELIMINATION Goal: RH STG MANAGE BLADDER WITH ASSISTANCE Description: STG Manage Bladder With minimal Assistance 01/20/2024 0515 by Harlee Lichtenstein, RN Outcome: Progressing 01/20/2024 0514 by Harlee Lichtenstein, RN Outcome: Progressing Goal: RH STG MANAGE BLADDER WITH MEDICATION WITH ASSISTANCE Description: STG Manage Bladder With Medication With minimal Assistance. 01/20/2024 0515 by Harlee Lichtenstein, RN Outcome:  Progressing 01/20/2024 0514 by Harlee Lichtenstein, RN Outcome: Progressing   Problem: RH SKIN INTEGRITY Goal: RH STG SKIN FREE OF INFECTION/BREAKDOWN 01/20/2024 0515 by Harlee Lichtenstein, RN Outcome: Progressing 01/20/2024 0514 by Harlee Lichtenstein, RN Outcome: Progressing Goal: RH STG MAINTAIN SKIN INTEGRITY WITH ASSISTANCE Description: STG Maintain Skin Integrity With Assistance. 01/20/2024 0515 by Harlee Lichtenstein, RN Outcome: Progressing 01/20/2024 0514 by Harlee Lichtenstein, RN Outcome: Progressing Goal: RH STG ABLE TO PERFORM INCISION/WOUND CARE W/ASSISTANCE Description: STG Able To Perform Incision/Wound Care With Assistance. 01/20/2024 0515 by Harlee Lichtenstein, RN Outcome: Progressing 01/20/2024 0514 by Harlee Lichtenstein, RN Outcome: Progressing   Problem: RH SAFETY Goal: RH STG ADHERE TO SAFETY PRECAUTIONS W/ASSISTANCE/DEVICE Description: STG Adhere to Safety Precautions With minimal Assistance/Device. 01/20/2024 0515 by Harlee Lichtenstein, RN Outcome: Progressing 01/20/2024 0514 by Harlee Lichtenstein, RN Outcome: Progressing Goal: RH STG DECREASED RISK OF FALL WITH ASSISTANCE Description: STG Decreased Risk of Fall With minimal Assistance. 01/20/2024 0515 by Harlee Lichtenstein, RN Outcome: Progressing 01/20/2024 0514 by Harlee Lichtenstein, RN Outcome: Progressing   Problem: RH PAIN MANAGEMENT Goal: RH STG PAIN MANAGED AT OR BELOW PT'S PAIN GOAL Description: <4 w/ prns 01/20/2024 0515 by Harlee Lichtenstein, RN Outcome: Progressing 01/20/2024 0514 by Harlee Lichtenstein, RN Outcome: Progressing   Problem: RH KNOWLEDGE DEFICIT SCI Goal: RH STG INCREASE KNOWLEDGE OF SELF CARE AFTER SCI 01/20/2024 0515 by Harlee Lichtenstein, RN Outcome: Progressing 01/20/2024 0514 by Harlee Lichtenstein, RN Outcome: Progressing   Problem: Consults Goal: RH SPINAL CORD INJURY PATIENT EDUCATION Description:  See Patient Education module for education specifics.  01/20/2024 0515 by Harlee Lichtenstein, RN Outcome:  Progressing 01/20/2024 0514 by Harlee Lichtenstein, RN Outcome: Progressing   Problem: SCI BOWEL ELIMINATION Goal: RH STG MANAGE BOWEL WITH ASSISTANCE Description: STG  Manage Bowel with Assistance. 01/20/2024 0515 by Harlee Lichtenstein, RN Outcome: Progressing 01/20/2024 0514 by Harlee Lichtenstein, RN Outcome: Progressing   Problem: SCI BLADDER ELIMINATION Goal: RH STG MANAGE BLADDER WITH ASSISTANCE Description: STG Manage Bladder With Assistance 01/20/2024 0515 by Harlee Lichtenstein, RN Outcome: Progressing 01/20/2024 0514 by Harlee Lichtenstein, RN Outcome: Progressing   Problem: RH SKIN INTEGRITY Goal: RH STG SKIN FREE OF INFECTION/BREAKDOWN 01/20/2024 0515 by Harlee Lichtenstein, RN Outcome: Progressing 01/20/2024 0514 by Harlee Lichtenstein, RN Outcome: Progressing   Problem: RH SAFETY Goal: RH STG ADHERE TO SAFETY PRECAUTIONS W/ASSISTANCE/DEVICE Description: STG Adhere to Safety Precautions With minimal  Assistance/Device. 01/20/2024 0515 by Harlee Lichtenstein, RN Outcome: Progressing 01/20/2024 0514 by Harlee Lichtenstein, RN Outcome: Progressing   Problem: RH PAIN MANAGEMENT Goal: RH STG PAIN MANAGED AT OR BELOW PT'S PAIN GOAL 01/20/2024 0515 by Harlee Lichtenstein, RN Outcome: Progressing 01/20/2024 0514 by Harlee Lichtenstein, RN Outcome: Progressing   Problem: RH KNOWLEDGE DEFICIT SCI Goal: RH STG INCREASE KNOWLEDGE OF SELF CARE AFTER SCI Description: Manage increase knowledge of self care \\after  SCI  SCI with minimal assistance  using educational materials provided 01/20/2024 0515 by Harlee Lichtenstein, RN Outcome: Progressing 01/20/2024 0514 by Harlee Lichtenstein, RN Outcome: Progressing

## 2024-01-21 LAB — CBC
HCT: 27.7 % — ABNORMAL LOW (ref 36.0–46.0)
Hemoglobin: 9 g/dL — ABNORMAL LOW (ref 12.0–15.0)
MCH: 29.9 pg (ref 26.0–34.0)
MCHC: 32.5 g/dL (ref 30.0–36.0)
MCV: 92 fL (ref 80.0–100.0)
Platelets: 160 10*3/uL (ref 150–400)
RBC: 3.01 MIL/uL — ABNORMAL LOW (ref 3.87–5.11)
RDW: 15 % (ref 11.5–15.5)
WBC: 4.1 10*3/uL (ref 4.0–10.5)
nRBC: 0 % (ref 0.0–0.2)

## 2024-01-21 LAB — SEDIMENTATION RATE: Sed Rate: 94 mm/h — ABNORMAL HIGH (ref 0–22)

## 2024-01-21 LAB — COMPREHENSIVE METABOLIC PANEL WITH GFR
ALT: 67 U/L — ABNORMAL HIGH (ref 0–44)
AST: 65 U/L — ABNORMAL HIGH (ref 15–41)
Albumin: 2.3 g/dL — ABNORMAL LOW (ref 3.5–5.0)
Alkaline Phosphatase: 321 U/L — ABNORMAL HIGH (ref 38–126)
Anion gap: 10 (ref 5–15)
BUN: 26 mg/dL — ABNORMAL HIGH (ref 8–23)
CO2: 27 mmol/L (ref 22–32)
Calcium: 8.7 mg/dL — ABNORMAL LOW (ref 8.9–10.3)
Chloride: 98 mmol/L (ref 98–111)
Creatinine, Ser: 1.34 mg/dL — ABNORMAL HIGH (ref 0.44–1.00)
GFR, Estimated: 44 mL/min — ABNORMAL LOW (ref >60)
Glucose, Bld: 256 mg/dL — ABNORMAL HIGH (ref 70–99)
Potassium: 4.4 mmol/L (ref 3.5–5.1)
Sodium: 135 mmol/L (ref 135–145)
Total Bilirubin: 0.3 mg/dL (ref 0.0–1.2)
Total Protein: 5.8 g/dL — ABNORMAL LOW (ref 6.5–8.1)

## 2024-01-21 LAB — GLUCOSE, CAPILLARY
Glucose-Capillary: 252 mg/dL — ABNORMAL HIGH (ref 70–99)
Glucose-Capillary: 226 mg/dL — ABNORMAL HIGH (ref 70–99)
Glucose-Capillary: 183 mg/dL — ABNORMAL HIGH (ref 70–99)
Glucose-Capillary: 199 mg/dL — ABNORMAL HIGH (ref 70–99)

## 2024-01-21 LAB — C-REACTIVE PROTEIN: CRP: 3 mg/dL — ABNORMAL HIGH (ref <1.0)

## 2024-01-21 NOTE — Progress Notes (Signed)
 PROGRESS NOTE   Subjective/Complaints:  Some mild diastolic hypotension overnight, otherwise labs are stable. Blood sugars have been elevated into the 200s over the last 24 hours. A.m. labs significant for resolved hyponatremia, stable BUN/creatinine, increasing ALP but decreasing AST, increasing ALT.  Hemoglobin up to 9.0. Continent of bowel and bladder, last bowel movement this a.m.   ROS: as per HPI. Denies CP, SOB, abd pain, N/V/D/C, or any other complaints at this time.   + confusion--improved, associated with pain medication + R shoulder pain--improved  Objective:   No results found.   Recent Labs    01/21/24 0411  WBC 4.1  HGB 9.0*  HCT 27.7*  PLT 160   Recent Labs    01/19/24 0348 01/21/24 0411  NA 133* 135  K 4.3 4.4  CL 97* 98  CO2 26 27  GLUCOSE 217* 256*  BUN 24* 26*  CREATININE 1.34* 1.34*  CALCIUM 8.3* 8.7*      Intake/Output Summary (Last 24 hours) at 01/21/2024 0814 Last data filed at 01/21/2024 0739 Gross per 24 hour  Intake 430 ml  Output 0 ml  Net 430 ml        Physical Exam: Vital Signs Blood pressure (!) 144/66, pulse 100, temperature 98 F (36.7 C), resp. rate 18, height 5\' 1"  (1.549 m), weight 91.2 kg, SpO2 98%.  Constitutional: No apparent distress. Appropriate appearance for age. +Obese.  Sitting up in w/c getting dressed. HENT: No JVD. Neck Supple. Trachea midline.  Lip swelling resolved Eyes: PERRLA. EOMI. Cardiovascular: RRR, no murmurs/rub/gallops.  1+ bilateral LE Edema. Peripheral pulses 2+  Respiratory: CTAB. No rales, rhonchi, or wheezing. On RA.  Abdomen: + bowel sounds, normoactive. No distention or tenderness.  Skin: PICC line with minimal bruising, intact. Back incision covered, dressing c/d/i   MSK:      No apparent deformity. + RUE shoulder abduction/flexion limited to <50 degrees--unchanged        Neurologic exam:  Cognition: AAO to person, place, time  and event.  Language: Fluent, No substitutions or neoglisms. No dysarthria.  Memory: No apparent deficits  Insight: Good insight into current condition.  Mood: Pleasant affect, appropriate mood.   Sensation: To light touch reduced in distal fingertips and toes bilaterally; sensation improving at bilateral knees   Reflexes: 2+ in BL UE; hyporeflexic BL LE. No babinski. CN: 2-12 grossly intact.  Coordination: No apparent tremors. No ataxia on FTN bilaterally.  Spasticity: MAS 0 in all extremities.       Strength:                RUE: 4-/5 SA, 5/5 EF, 5/5 EE, 5/5 WE, 5/5 FF, 5/5 FA                LUE:  5/5 SA, 5/5 EF, 5/5 EE, 5/5 WE, 5/5 FF, 5/5 FA                RLE: 3+/5 HF, 4-/5 KE, 3-/5  DF, 2/5  EHL, 3/5  PF                 LLE:  3-/5 HF, 4-/5 KE, 5-/5  DF, 5-/5  EHL, 5-/5  PF    Assessment/Plan: 1. Functional deficits which require 3+ hours per day of interdisciplinary therapy in a comprehensive inpatient rehab setting. Physiatrist is providing close team supervision and 24 hour management of active medical problems listed below. Physiatrist and rehab team continue to assess barriers to discharge/monitor patient progress toward functional and medical goals  Care Tool:  Bathing    Body parts bathed by patient: Right arm, Chest, Abdomen, Right upper leg, Left upper leg, Face   Body parts bathed by helper: Left arm, Front perineal area, Buttocks, Right lower leg, Left lower leg     Bathing assist Assist Level: Moderate Assistance - Patient 50 - 74%     Upper Body Dressing/Undressing Upper body dressing   What is the patient wearing?: Button up shirt    Upper body assist Assist Level: Moderate Assistance - Patient 50 - 74%    Lower Body Dressing/Undressing Lower body dressing      What is the patient wearing?: Pants     Lower body assist Assist for lower body dressing: Total Assistance - Patient < 25%     Toileting Toileting    Toileting assist Assist for  toileting: 2 Helpers     Transfers Chair/bed transfer  Transfers assist     Chair/bed transfer assist level: Maximal Assistance - Patient 25 - 49%     Locomotion Ambulation   Ambulation assist   Ambulation activity did not occur: Safety/medical concerns (weakness, pain, decreased balance)          Walk 10 feet activity   Assist  Walk 10 feet activity did not occur: Safety/medical concerns (weakness, pain, decreased balance)        Walk 50 feet activity   Assist Walk 50 feet with 2 turns activity did not occur: Safety/medical concerns (weakness, pain, decreased balance)         Walk 150 feet activity   Assist Walk 150 feet activity did not occur: Safety/medical concerns (weakness, pain, decreased balance)         Walk 10 feet on uneven surface  activity   Assist Walk 10 feet on uneven surfaces activity did not occur: Safety/medical concerns (weakness, pain, decreased balance)         Wheelchair     Assist Is the patient using a wheelchair?: Yes Type of Wheelchair: Manual    Wheelchair assist level: Supervision/Verbal cueing Max wheelchair distance: 62'    Wheelchair 50 feet with 2 turns activity    Assist        Assist Level: Supervision/Verbal cueing   Wheelchair 150 feet activity     Assist      Assist Level: Dependent - Patient 0%   Blood pressure (!) 144/66, pulse 100, temperature 98 F (36.7 C), resp. rate 18, height 5\' 1"  (1.549 m), weight 91.2 kg, SpO2 98%.  Medical Problem List and Plan: 1. 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  OK to stop continuous penicillin G for showers.             -ELOS/Goals: 9-12 days, [facility] PT/OT - 4/25 DC date             -Continue CIR   - 4/8: Max a bed mobility, STEDY - very fearful of falling. She  did  some marching at EOB today with the walker. Min A short distances goals. Mod A UB Max A LBD and toiletting; really limited by pain and ROM in her right arm.    2.  Antithrombotics: -DVT/anticoagulation:  Mechanical: Sequential compression devices, below knee Bilateral lower extremities  -01/13/24 DVT US neg              -antiplatelet therapy: N/A 3. Acute on chronic pain/Pain Management:  Was on Oxycodone 5 mg and Lyrica 75 mg daily PTA.  --Having surgical pain--continue Oxycodone prn. Pain worse at nights-->will schedule oxycodone at bedtime.  -Voltaren gel QID -4/7: DC morphine due to no utilization - 4/8: Oxy scheduled overnight and when taken in the a.m. causing some lethargy intermittently--patient has tolerated hydrocodone in the past, will switch to Norco 2.5 to 5 mg every 6 hours as needed.  Will also schedule Tylenol 500 mg 3 times daily. - 4/9: LFTS up; DC scheduled tylenol. Add oxy to allergy list d/t confusion. Doing much better with Norco - 4/10: Having some a.m. confusion with p.m. Norco.  Has had issues with tramadol in the past.  Patient is already using rarely, advised to continue this regimen and wean off as tolerated.  4. Mood/Behavior/Sleep: LCSW to folow for evaluation and support.              -antipsychotic agents: N/A  -Cymbalta 60mg  nightly, mirapex 1mg  at bedtime, mirtazapine 30mg  nightly  -Patient reports sleeping well  5. Neuropsych/cognition: This patient is capable of making decisions on his own behalf.  4/8: Having increased confusion, staring episodes without new neurologic deficits.  Adjusting pain medications as above.  Has had issues with hallucinations/confusion with cefepime in the past, would consider antibiotic adjustment if alternative causes ruled out.  4/9: episodes resolved with pain medication changes  6. Skin/Wound Care: Routine pressure relief measures. Monitor incision for healing.   4/7: Neurosurgery removed Hemovac over the weekend, current  VAC with no drainage, if no further output in 24 hours will discuss removal with Dr. Delorise Shiner removal in 2 days per Dr. Yetta Barre  Wound VAC removed 4-10- -4-11: Limited visualizations due to TLSO when out of bed, nursing to place picture of wound in chart today.  7. Fluids/Electrolytes/Nutrition: Monitor I/O. Routine labs  -01/12/24 CMP stable today, Cr down to 1.55, AST 44, improving, monitor  4-7: LFTs mildly up trending, BUN and creatinine down, mild hyponatremia.  Limit Tylenol to 3000 mg daily as above.  Repeat in 2 to 3 days.  4/9: LFTS continue up; NA 131. See below  8. Neuropathy/band like pain around waist: Will increase Lyrica to TID 9. Osteomyelitis/discitis. Pending PICC placement. -- Starting 6 weeks of daptomycin and ertapenem for 6 weeks through 02/20/24 to be followed by PO suppression doxycycline and ciprofloxacin.  -01/13/24 Cx NGTD x4d--> 1 of 2 cultures grew cutibacterium acnes--covered with current antibiotics 4-7: ESR added on to a.m. labs.  CRP up today; discussed with ID, no new orders. 4-10: Infectious disease changing ertapenem to Cipro due to LFT elevations as below.  Continue daptomycin; has had issues with vancomycin in the past 4-11: Daptomycin switched to continuous penicillin G yesterday afternoon.  Continuing Cipro.  Per infectious disease/pharmacy looking into insurance coverage for continuous penicillin G cassette versus switching to Rocephin prior to discharge.  If switching to Rocephin, asked to be informed a few days prior so we could trial it at rehab, given significant side effects to cefepime in the past (more likely related to accumulation with  AKI - not true allergy). 4/14: tolerating rocephin since Friday  10. HTN:  Monitor BP TID--on torsemide 40mg  daily and coreg 6.125mg  BID (not reordered?). -4/5-6/25 BPs mostly fine, coreg not ordered as of now; remain off for now 4-7: Systolic low, p.o. intakes minimal.  Reduce torsemide for tomorrow a.m. to 20 mg, and  encourage p.o. fluids today. 4-8: Diastolic remains low, will DC torsemide.  Schedule daily weights. 4/9: BP up a bit, continue to hold torsemide  4-10: Increased lower extremity edema, remains with diastolic hypotension but wishes to resume home torsemide; will resume at 20 mg daily.  Continue teds 4-11: Peripheral edema improved, patient tolerating well.  Continue current medications. -4/12-13/25 BPs great, monitor 4.14: Diastolic soft, but stable. Monitor with therapies.   Vitals:   01/17/24 1414 01/17/24 2026 01/18/24 0421 01/18/24 1550  BP: 135/62 (!) 111/50 (!) 127/56 (!) 132/58   01/18/24 1945 01/19/24 0431 01/19/24 1303 01/19/24 2000  BP: (!) 126/53 (!) 134/58 130/74 (!) 142/47   01/20/24 0507 01/20/24 1300 01/20/24 2030 01/21/24 0452  BP: 134/62 138/60 (!) 118/52 (!) 144/66    11. T2DM: Hgb A1C-7.9. Used [name] 50-120/ 5 units ac meal TID at home.              --Continue insulin glargine 20 units daily with SSI.  -4/5-6/25 CBGs a bit better this weekend, monitor trend but may need further adjustments since they're still mostly >200 4/7: Blood sugars remain elevated, increase glargine to 25 units daily starting tomorrow a.m. 4/11: Blood sugars uptrending with improved p.o.; resume home standing short acting insulin, started 3 units 3 times daily WC -4/14: CBG elevated, seems to have been better overall over the weekend so we will monitor for 24 hours and if remains consistently elevated will adjust insulin CBG (last 3)  Recent Labs    01/20/24 1632 01/20/24 2128 01/21/24 0648  GLUCAP 228* 163* 252*    12.  Acute on CKD stage III--baseline creatinine 1.4: SCr has had rise to 1.56-->will d/c celebrex.  -01/12/24 Cr 1.55 today, down from 1.86 yesterday, monitor Monday  4-7, 4/9: BUN, creatinine improving. 4-11: BUN stable, creatinine slightly up to 1.26; monitor on Monday, may need to DC naprosyn if up trended -01/19/24 Cr up to 1.34, still better than prior and near her  baseline; will decrease naprosyn to 250mg  BID for now, monitor on Monday labs.  4/14: Cr stable  13. Fever blisters: Will start valtrex 1g BID x1wk as symptomatic. Improving, EOT 4/11 14. Right shoulder pain: Question strain but heard a pop this am and extremely concerned that she may have torn her RTC again --Voltaren gel for local measures.  -- Consider MRI if no improvement 4/8: R shoulder remains very painful/limited - will get x-ray and consider MRI if no acute findings. 4/9: Xray with mild changes; discussed with patient, unable to get MRI due to spinal cord stimulator leads, will order CT 4/10: CT nondiagnostic, some changes "suggestive of rotator cuff tear", mild before meals and glenohumeral degenerative changes.  Consulted orthopedics, appreciate Earney Hamburg PA-C's evaluation, recommending no intervention at this time, added naproxen 500 mg twice daily.   15. Constipation: Continue Senna 2 tabs daily--miralax was added on 04/04 -01/12/24 LBM yesterday, didn't get the 32oz miralax so d/c'd. Continue miralax 17g daily for now.  -01/13/24 LBM last night, cont regimen as is for now, adjust accordingly 4-7: Increase Senokot-S to 2 tabs twice daily, MiraLAX BID 01/20/24 LBM yesterday, cont regimen  16. Depression/Anxiety: Continue Cymbalta  60mg  nightly and Mirtazapine 30mg  nightly.    17. Neurogenic bladder: Continue foley. D/c in am if has results with laxative tonight. -01/12/24 had BM last night, will pull foley, do timed toileting and PVRs q4-6h, ISC if PVR >348ml -01/13/24 urinating well, low PVRs, continue checking but could d/c tomorrow if still urinating fine.  4-7: Single episode of incontinence overnight, with PVR 170s.  Otherwise continent.  Continue current regimen.  18. ABLA: Hgb 10.5 01/12/24, monitor  -4-7: Hemoglobin down from 10.5-9.4; no overt signs of bleeding, repeat in 1 to 2 days  4-9: Hemoglobin stable, 8.8 -01/20/24 Hgb 8.1 on 4/11 but up to 8.3 on 4/12; monitor  tomorrow since still fairly stable.   19. GERD: reordered Protonix 40mg  daily -4-10: Increase Protonix to 40 mg twice daily for GI prophylaxis with antibiotics and now naprosyn  20.  Transaminitis.  Uptrending on 4-7 labs.  -May be stasis related due to poor mobility; Tylenol utilization within parameters  -Repeat in 2 days  4/9: Increasing. DC standing tylenol. Get liver US  today, + GGT. Per pharmacy medications unlikely contributors.   4-10: Liver ultrasound without acute findings.  Continues to uptrend; discussed with pharmacy, infectious disease, feel this is close enough to her baseline elevations but no concern but will change ertapenem to Cipro.  4/11:  LFTs have stabilized.  Monitor.  -01/19/24 LFTs fairly stable overall, will change CMP to Monday instead of daily through the weekend.   21. Hyponatremia.   - ?etiology; possible with abx but unlikely   - Holding torsemide as above   - Add 1200 cc fluid restriciton   - Serum OSM + urine OSM, NA 4/9   - repeat in AM  4-10: Improved today, 132.  Will need to monitor closely with resumption of torsemide.  Add labs in a.m.  01/19/24 stable 133  LOS: 10 days A FACE TO FACE EVALUATION WAS PERFORMED  Katelyn [name] 01/21/2024, 8:14 AM

## 2024-01-21 NOTE — Plan of Care (Signed)
  Problem: Consults Goal: RH SPINAL CORD INJURY PATIENT EDUCATION Description:  See Patient Education module for education specifics.  Outcome: Progressing Goal: Skin Care Protocol Initiated - if Braden Score 18 or less Description: If consults are not indicated, leave blank or document N/A Outcome: Progressing Goal: Nutrition Consult-if indicated Outcome: Progressing Goal: Diabetes Guidelines if Diabetic/Glucose > 140 Description: If diabetic or lab glucose is > 140 mg/dl - Initiate Diabetes/Hyperglycemia Guidelines & Document Interventions  Outcome: Progressing   Problem: SCI BOWEL ELIMINATION Goal: RH STG MANAGE BOWEL WITH ASSISTANCE Description: STG Manage Bowel with minimal  Assistance. Outcome: Progressing Goal: RH STG SCI MANAGE BOWEL WITH MEDICATION WITH ASSISTANCE Description: STG SCI Manage bowel with medication with minimal assistance. Outcome: Progressing   Problem: SCI BLADDER ELIMINATION Goal: RH STG MANAGE BLADDER WITH ASSISTANCE Description: STG Manage Bladder With minimal Assistance Outcome: Progressing Goal: RH STG MANAGE BLADDER WITH MEDICATION WITH ASSISTANCE Description: STG Manage Bladder With Medication With minimal Assistance. Outcome: Progressing   Problem: RH SKIN INTEGRITY Goal: RH STG SKIN FREE OF INFECTION/BREAKDOWN Outcome: Progressing Goal: RH STG MAINTAIN SKIN INTEGRITY WITH ASSISTANCE Description: STG Maintain Skin Integrity With Assistance. Outcome: Progressing Goal: RH STG ABLE TO PERFORM INCISION/WOUND CARE W/ASSISTANCE Description: STG Able To Perform Incision/Wound Care With Assistance. Outcome: Progressing   Problem: RH SAFETY Goal: RH STG ADHERE TO SAFETY PRECAUTIONS W/ASSISTANCE/DEVICE Description: STG Adhere to Safety Precautions With minimal Assistance/Device. Outcome: Progressing Goal: RH STG DECREASED RISK OF FALL WITH ASSISTANCE Description: STG Decreased Risk of Fall With minimal Assistance. Outcome: Progressing   Problem:  RH PAIN MANAGEMENT Goal: RH STG PAIN MANAGED AT OR BELOW PT'S PAIN GOAL Description: <4 w/ prns Outcome: Progressing   Problem: RH KNOWLEDGE DEFICIT SCI Goal: RH STG INCREASE KNOWLEDGE OF SELF CARE AFTER SCI Outcome: Progressing   Problem: Consults Goal: RH SPINAL CORD INJURY PATIENT EDUCATION Description:  See Patient Education module for education specifics.  Outcome: Progressing   Problem: SCI BOWEL ELIMINATION Goal: RH STG MANAGE BOWEL WITH ASSISTANCE Description: STG Manage Bowel with Assistance. Outcome: Progressing   Problem: SCI BLADDER ELIMINATION Goal: RH STG MANAGE BLADDER WITH ASSISTANCE Description: STG Manage Bladder With Assistance Outcome: Progressing   Problem: RH SKIN INTEGRITY Goal: RH STG SKIN FREE OF INFECTION/BREAKDOWN Outcome: Progressing   Problem: RH SAFETY Goal: RH STG ADHERE TO SAFETY PRECAUTIONS W/ASSISTANCE/DEVICE Description: STG Adhere to Safety Precautions With minimal  Assistance/Device. Outcome: Progressing   Problem: RH PAIN MANAGEMENT Goal: RH STG PAIN MANAGED AT OR BELOW PT'S PAIN GOAL Outcome: Progressing   Problem: RH KNOWLEDGE DEFICIT SCI Goal: RH STG INCREASE KNOWLEDGE OF SELF CARE AFTER SCI Description: Manage increase knowledge of self care \\after  SCI  SCI with minimal assistance  using educational materials provided Outcome: Progressing

## 2024-01-21 NOTE — Progress Notes (Signed)
 Occupational Therapy Session Note  Patient Details  Name: Katelyn [name] MRN: [id number] Date of Birth: [birth year]  Today's Date: 01/21/2024 OT Individual Time: 1105-1200 OT Individual Time Calculation (min): 55 min    Short Term Goals: Week 2:  OT Short Term Goal 1 (Week 2): Pt will perform hike/lowering of LB garments with Min A + RW. OT Short Term Goal 2 (Week 2): Pt will manage UB care with setup/supervision, incorporating compensatory techniques with Min cuing. OT Short Term Goal 3 (Week 2): Pt will perform LB bathing with consisten Min A + LRAD.  Skilled Therapeutic Interventions/Progress Updates:     Pt received reclined in bed with family present in room. Pt presenting to be in good spirits receptive to skilled OT session reporting 2/10 pain- OT offering intermittent rest breaks, repositioning, and therapeutic support to optimize participation in therapy session. Pt reporting she had more significant pain earlier in the AM, however pain has decreased 2/2 to rest and pain medications.   Pt requesting to use restroom at beginning of session. Pt transitioned to EOB with HOB elevated with min A +increased time with assistance required to bring B LEs to EOB. Donned TLSO siting EOB with mod A. Utilized STEDY for transport <> bathroom to increase opportunities to work on sit<>stands. Pt able to complete sit <> stands from [facility] with min A and maintain standing balance during toileting with min A. Pt doff/donned pants in standing with assistance required to fully bring pants over buttocks and complete peri-care in seated position with set-up A.   Engaged Pt in completing wc propulsion <> therapy gym during session for endurance and mobility training. Pt required supervision to intermittent min A to navigate between tight spaces and to avoid obstacles.   Positioned large vertical mirror anterior to Pt for visual feedback. Engaged Pt in completing blocked practice of sit<>stands to RW with focus on  technique. Education and demonstration provided on importance of coming to edge of seat, hand placement with single UE support on RW and single UE on wc arm rest, foot placement, and importance of anterior weight shifting to bring weight into feet. Pt initially requiring mod A to rise to standing position, however with increased practice Pt able to complete 3 trials with min A. Graded up task to work on lateral weight shifting and marches in place with Pt able to complete 3x8 reps with min A for balance and mod verbal cues for techniques.   Engaged Pt in dynamic standing balance task at window using RW with focus on increasing Pt's self-efficacy in standing position with single UE support on RW to increase safety and confidence in completing BADLs in standing. Pt tasked with donning squigz onto window, alternating reaching with R/LUE. Pt initially fearful of falling requiring min/mod A to maintain balance, however with increased practice Pt able to complete 10 reps donning/doffing squigz with CGA-min A provided for balance and seated rest breaks provided between trials.   Pt was left resting in wc with call bell in reach, family present in room, and all needs met.    Therapy Documentation Precautions:  Precautions Precautions: Fall, Back Recall of Precautions/Restrictions: Intact (Pt able to recall 3/3 spinal precautions.) Precaution/Restrictions Comments: Verbally reviewed spinal precautions, re-educated on acronym BLT. Required Braces or Orthoses: Spinal Brace Spinal Brace: Thoracolumbosacral orthotic, Applied in sitting position Restrictions Weight Bearing Restrictions Per Provider Order: No   Therapy/Group: Individual Therapy  Clide Deutscher 01/21/2024, 12:48 PM

## 2024-01-21 NOTE — Progress Notes (Signed)
 Occupational Therapy Session Note  Patient Details  Name: Katelyn [name] MRN: [id number] Date of Birth: [birth year]  Today's Date: 01/21/2024 OT Individual Time: 1306-1350 OT Individual Time Calculation (min): 44 min    Short Term Goals: Week 2:  OT Short Term Goal 1 (Week 2): Pt will perform hike/lowering of LB garments with Min A + RW. OT Short Term Goal 2 (Week 2): Pt will manage UB care with setup/supervision, incorporating compensatory techniques with Min cuing. OT Short Term Goal 3 (Week 2): Pt will perform LB bathing with consisten Min A + LRAD.  Skilled Therapeutic Interventions/Progress Updates:  Pt greeted seated in w/c, pt agreeable to OT intervention.     Pts husband present throughout session.   Transfers/bed mobility/functional mobility:  Session focused on problem solving entering walkin shower at home. Pt has walkin shower with lip at home with a seat but no back support on the seat. Did recommend family get a shower seat with back support and bilateral arm rests. Pt and husband agreeable.   First practiced toe taps on shower stall as precursor to stepping over shower threshold. Pt needed BUE support from RW but able to complete task with MINA, MODA to rise from w/c as pt had been sitting for a while.   D/t pt relying a lot on BUE support in standing, pt did best with stepping over shower threshold with BUE support from RW and pt stepping over threshold forward facing. Pt stepped over threshold and turned to seat with MODA, step by step cues needed to sequence task.   Pt is hopeful that she could eventually use her grab bars to enter shower, but at this time recommend bringing RW into bathroom stall, husband confirms that it will fit.   Ended session with pt seated in w/c with all needs within reach.             Therapy Documentation Precautions:  Precautions Precautions: Fall, Back Recall of Precautions/Restrictions: Intact (Pt able to recall 3/3 spinal  precautions.) Precaution/Restrictions Comments: Verbally reviewed spinal precautions, re-educated on acronym BLT. Required Braces or Orthoses: Spinal Brace Spinal Brace: Thoracolumbosacral orthotic, Applied in sitting position Restrictions Weight Bearing Restrictions Per Provider Order: No  Pain: no pain     Therapy/Group: Individual Therapy  Mollie Anger [facility] 01/21/2024, 3:14 PM

## 2024-01-21 NOTE — Progress Notes (Signed)
 Occupational Therapy Session Note  Patient Details  Name: Katelyn [name] MRN: [id number] Date of Birth: [birth year]  {CHL IP REHAB OT TIME CALCULATIONS:304400400}  {CHL IP REHAB OT TIME CALCULATIONS:304400400}  Short Term Goals: Week 2:  OT Short Term Goal 1 (Week 2): Pt will perform hike/lowering of LB garments with Min A + RW. OT Short Term Goal 2 (Week 2): Pt will manage UB care with setup/supervision, incorporating compensatory techniques with Min cuing. OT Short Term Goal 3 (Week 2): Pt will perform LB bathing with consisten Min A + LRAD.  Skilled Therapeutic Interventions/Progress Updates:   Session 1:   Session 2:   Therapy Documentation Precautions:  Precautions Precautions: Fall, Back Recall of Precautions/Restrictions: Intact (Pt able to recall 3/3 spinal precautions.) Precaution/Restrictions Comments: Verbally reviewed spinal precautions, re-educated on acronym BLT. Required Braces or Orthoses: Spinal Brace Spinal Brace: Thoracolumbosacral orthotic, Applied in sitting position Restrictions Weight Bearing Restrictions Per Provider Order: No   Therapy/Group: Individual Therapy  Artemus Biles, OTR/L, MSOT  01/21/2024, 8:25 PM

## 2024-01-21 NOTE — Progress Notes (Signed)
 Physical Therapy Session Note  Patient Details  Name: Katelyn [name] MRN: [id number] Date of Birth: Feb 23, 1956  Today's Date: 01/21/2024 PT Individual Time: 0805-0900 PT Individual Time Calculation (min): 55 min   Short Term Goals: Week 2:  PT Short Term Goal 1 (Week 2): Pt will complete bed mobility with mod assist consistently PT Short Term Goal 2 (Week 2): Pt will ambulate with RW 79' with mod assist and 2nd person WC follow PT Short Term Goal 3 (Week 2): Pt will complete stand pivot transfers bed<>WC with mod assist consistently  Skilled Therapeutic Interventions/Progress Updates:    Pt presents in room in [facility], handoff from RN, pt agreeable to PT. Pt denies pain. Session focused on therapeutic activities to facilitate participation with self care tasks as well as gait training and WC mobility training.  Therapist dons TED hose to manage BLE swelling, total assist for time management and energy conservation. Pt then completes oral and face hygiene in sitting at sink with supervision, completed to promote UE endurance and functional reach needed for transfers and ambulation.   Pt then completes WC mobility with BUEs to day room ~75' with supervision/min assist to correct for R path deviation, pt reporting soreness in RUE but denies pain with task. Pt educated on WC mobility for turns and path correction with pt demonstrating understanding.  Pt then completes sit to stand with min assist, cues for hand placement, ambulates with RW 10', 12', and 15' with min assist and +2 WC follow demonstrating decreased terminal hip extension in stance phase. Pt demonstrating improving hip flexion and foot clearance with gait however does continue to demonstrate ataxia bilaterally, more pronounced on RLE with some scissoring noted.  Pt completes therapeutic exercise for BLE strengthening in standing with BUE support on RW including: - standing glute set x10 - standing hip flexion x10 BLE  Pt returns to room  and remains seated in [facility] with all needs within reach, cal light in place and chair alarm donned and activated at end of session.   Therapy Documentation Precautions:  Precautions Precautions: Fall, Back Recall of Precautions/Restrictions: Intact (Pt able to recall 3/3 spinal precautions.) Precaution/Restrictions Comments: Verbally reviewed spinal precautions, re-educated on acronym BLT. Required Braces or Orthoses: Spinal Brace Spinal Brace: Thoracolumbosacral orthotic, Applied in sitting position Restrictions Weight Bearing Restrictions Per Provider Order: No   Therapy/Group: Individual Therapy  Annia Kilts PT, DPT 01/21/2024, 9:00 AM

## 2024-01-21 NOTE — Progress Notes (Signed)
 Physical Therapy Session Note  Patient Details  Name: PATTE WINKEL MRN: [id number] Date of Birth: 1956-06-29  Today's Date: 01/21/2024 PT Individual Time: 2725-3664 PT Individual Time Calculation (min): 46 min   Short Term Goals: Week 1:  PT Short Term Goal 1 (Week 1): pt will perform bed mobility with mod A consistantly PT Short Term Goal 1 - Progress (Week 1): Progressing toward goal PT Short Term Goal 2 (Week 1): pt will transfer sit<>stand with LRAD and mod A consistantly PT Short Term Goal 2 - Progress (Week 1): Met PT Short Term Goal 3 (Week 1): pt will ambulate 12ft with LRAD and max A of 1 PT Short Term Goal 3 - Progress (Week 1): Progressing toward goal Week 2:  PT Short Term Goal 1 (Week 2): Pt will complete bed mobility with mod assist consistently PT Short Term Goal 2 (Week 2): Pt will ambulate with RW 6' with mod assist and 2nd person WC follow PT Short Term Goal 3 (Week 2): Pt will complete stand pivot transfers bed<>WC with mod assist consistently  Skilled Therapeutic Interventions/Progress Updates:  Patient in bathroom on entrance to room. Patient alert and agreeable to PT session. NT present and relates that husband transported pt to toilet using STEDY - not cleared on safety plan.   Patient with no pain complaint at start of session.  Requires MinA in rise to stand and MaxA for pericare from NT.   Therapeutic Activity: Bed Mobility: Pt performed sit>supine with CGA. VC/ tc required for reaching sidelying prior to roll onto back. Cued to focus on bringing heels to bed surface with knees bent but she is unable to maintain sidelying in bringing LE to bed surface. Rolls to supine requriign minimal readjustments to BLE which pt is able to perform IND.  Transfers: Pt performed sit<>stand and stand pivot transfers throughout session with heavy CGA. Provided vc/ tc for bending forward at hips to maintain back precautions. Is able to perform with split UE positioning and push  from armrest.   Pt guided in continuous reciprocation of BUE and BLE using NuStep L3 x with focus on maintaining pace with [employer] program set to 55 steps/ min. Pt is able to match pace throughout with effort at 1.5 METs. Back precautions maintained throughout.   Patient supine in bed at end of session with brakes locked, bed alarm set, and all needs within reach.   Therapy Documentation Precautions:  Precautions Precautions: Fall, Back Recall of Precautions/Restrictions: Intact (Pt able to recall 3/3 spinal precautions.) Precaution/Restrictions Comments: Verbally reviewed spinal precautions, re-educated on acronym BLT. Required Braces or Orthoses: Spinal Brace Spinal Brace: Thoracolumbosacral orthotic, Applied in sitting position Restrictions Weight Bearing Restrictions Per Provider Order: No  Pain: Pain Assessment Pain Scale: 0-10 Pain Score: 2  Pain Type: Surgical pain;Chronic pain Pain Location: Back Pain Orientation: Lower   Therapy/Group: Individual Therapy  Donne Gage PT, DPT, CSRS 01/21/2024, 12:54 PM

## 2024-01-22 LAB — GLUCOSE, CAPILLARY
Glucose-Capillary: 226 mg/dL — ABNORMAL HIGH (ref 70–99)
Glucose-Capillary: 203 mg/dL — ABNORMAL HIGH (ref 70–99)
Glucose-Capillary: 171 mg/dL — ABNORMAL HIGH (ref 70–99)
Glucose-Capillary: 186 mg/dL — ABNORMAL HIGH (ref 70–99)

## 2024-01-22 MED ORDER — INSULIN GLARGINE-YFGN 100 UNIT/ML ~~LOC~~ SOLN
30.0000 [IU] | Freq: Every day | SUBCUTANEOUS | Status: DC
  Administered 2024-01-23 – 2024-01-24 (×2): 30 [IU] via SUBCUTANEOUS
  Filled 2024-01-22 (×3): qty 0.3

## 2024-01-22 MED ORDER — PANTOPRAZOLE SODIUM 40 MG PO TBEC
40.0000 mg | DELAYED_RELEASE_TABLET | Freq: Every day | ORAL | Status: DC
  Administered 2024-01-22 – 2024-01-29 (×8): 40 mg via ORAL
  Filled 2024-01-22 (×8): qty 1

## 2024-01-22 MED ORDER — TORSEMIDE 20 MG PO TABS
20.0000 mg | ORAL_TABLET | Freq: Once | ORAL | Status: AC
  Administered 2024-01-22: 20 mg via ORAL
  Filled 2024-01-22: qty 1

## 2024-01-22 MED ORDER — TORSEMIDE 20 MG PO TABS
40.0000 mg | ORAL_TABLET | Freq: Every day | ORAL | Status: DC
  Administered 2024-01-23 – 2024-02-01 (×10): 40 mg via ORAL
  Filled 2024-01-22 (×10): qty 2

## 2024-01-22 NOTE — Progress Notes (Signed)
 Patient ID: Katelyn [name], female   DOB: October 27, 1955, 68 y.o.   MRN: [id number]   SW met with pt and pt husband in room to provide updates from team conference, d/c date, and updates from [facility] continuing to follow, and will update once more information on cost of IV abx.   Norval Been, MSW, LCSW Office: [phone] Cell: [phone] Fax: [phone]

## 2024-01-22 NOTE — Progress Notes (Signed)
 PROGRESS NOTE   Subjective/Complaints:  Having issues with night shift not wanting to transfer her without TLSO, and trying to ambulate her to the bathroom.   Patient seen in room today, with her husband.  Concerned about increasing peripheral edema causing some discomfort in her legs.  Otherwise, no concerns, complaints.  Has not felt orthostatic despite low diastolic blood pressures.  ROS: as per HPI. Denies CP, SOB, abd pain, N/V/D/C, or any other complaints at this time.   + confusion--improved, associated with pain medication + R shoulder pain--ongoing. + Lower extremity edema  Objective:   No results found.   Recent Labs    01/21/24 0411  WBC 4.1  HGB 9.0*  HCT 27.7*  PLT 160   Recent Labs    01/21/24 0411  NA 135  K 4.4  CL 98  CO2 27  GLUCOSE 256*  BUN 26*  CREATININE 1.34*  CALCIUM 8.7*      Intake/Output Summary (Last 24 hours) at 01/22/2024 1057 Last data filed at 01/22/2024 2956 Gross per 24 hour  Intake 736 ml  Output --  Net 736 ml        Physical Exam: Vital Signs Blood pressure 132/62, pulse 96, temperature 98.2 F (36.8 C), temperature source Oral, resp. rate 17, height 5\' 1"  (1.549 m), weight 91.2 kg, SpO2 97%.  Constitutional: No apparent distress. Appropriate appearance for age. +Obese.  Bedside table.  HENT: No JVD. Neck Supple. Trachea midline.  Lip swelling resolved Eyes: PERRLA. EOMI. Cardiovascular: RRR, no murmurs/rub/gallops.  2-3+ bilateral LE Edema. Peripheral pulses 2+  Respiratory: CTAB. No rales, rhonchi, or wheezing. On RA.  Abdomen: Nondistended.  Obstructed by TLSO. Skin: PICC line clean, dry, intact. Back incision covered, dressing c/d/I--see below, not examined for-15   MSK:      No apparent deformity. + RUE shoulder abduction/flexion limited to <70 degrees: Unchanged       Neurologic exam:  Cognition: AAO to person, place, time and event.  Language:  Fluent, No substitutions or neoglisms. No dysarthria.  Memory: No apparent deficits  Insight: Good insight into current condition.  Mood: Pleasant affect, appropriate mood.   Sensation: To light touch reduced in distal fingertips and toes bilaterally; sensation improving at bilateral knees Strength: Antigravity  all 4 extremities, bilateral lower extremities approx 3/5   Prior exams:  Reflexes: 2+ in BL UE; hyporeflexic BL LE. No babinski. CN: 2-12 grossly intact.  Coordination: No apparent tremors. No ataxia on FTN bilaterally.  Spasticity: MAS 0 in all extremities.       Strength:                RUE: 4-/5 SA, 5/5 EF, 5/5 EE, 5/5 WE, 5/5 FF, 5/5 FA                LUE:  5/5 SA, 5/5 EF, 5/5 EE, 5/5 WE, 5/5 FF, 5/5 FA                RLE: 3+/5 HF, 4-/5 KE, 3-/5  DF, 2/5  EHL, 3/5  PF                 LLE:  3-/5 HF, 4-/5  KE, 5-/5  DF, 5-/5  EHL, 5-/5  PF    Assessment/Plan: 1. Functional deficits which require 3+ hours per day of interdisciplinary therapy in a comprehensive inpatient rehab setting. Physiatrist is providing close team supervision and 24 hour management of active medical problems listed below. Physiatrist and rehab team continue to assess barriers to discharge/monitor patient progress toward functional and medical goals  Care Tool:  Bathing    Body parts bathed by patient: Right arm, Chest, Abdomen, Right upper leg, Left upper leg, Face   Body parts bathed by helper: Left arm, Front perineal area, Buttocks, Right lower leg, Left lower leg     Bathing assist Assist Level: Moderate Assistance - Patient 50 - 74%     Upper Body Dressing/Undressing Upper body dressing   What is the patient wearing?: Button up shirt    Upper body assist Assist Level: Moderate Assistance - Patient 50 - 74%    Lower Body Dressing/Undressing Lower body dressing      What is the patient wearing?: Pants     Lower body assist Assist for lower body dressing: Total Assistance - Patient  < 25%     Toileting Toileting    Toileting assist Assist for toileting: 2 Helpers     Transfers Chair/bed transfer  Transfers assist     Chair/bed transfer assist level: Maximal Assistance - Patient 25 - 49%     Locomotion Ambulation   Ambulation assist   Ambulation activity did not occur: Safety/medical concerns (weakness, pain, decreased balance)          Walk 10 feet activity   Assist  Walk 10 feet activity did not occur: Safety/medical concerns (weakness, pain, decreased balance)        Walk 50 feet activity   Assist Walk 50 feet with 2 turns activity did not occur: Safety/medical concerns (weakness, pain, decreased balance)         Walk 150 feet activity   Assist Walk 150 feet activity did not occur: Safety/medical concerns (weakness, pain, decreased balance)         Walk 10 feet on uneven surface  activity   Assist Walk 10 feet on uneven surfaces activity did not occur: Safety/medical concerns (weakness, pain, decreased balance)         Wheelchair     Assist Is the patient using a wheelchair?: Yes Type of Wheelchair: Manual    Wheelchair assist level: Supervision/Verbal cueing Max wheelchair distance: 52'    Wheelchair 50 feet with 2 turns activity    Assist        Assist Level: Supervision/Verbal cueing   Wheelchair 150 feet activity     Assist      Assist Level: Dependent - Patient 0%   Blood pressure 132/62, pulse 96, temperature 98.2 F (36.8 C), temperature source Oral, resp. rate 17, height 5\' 1"  (1.549 m), weight 91.2 kg, SpO2 97%.  Medical Problem List and Plan: 1. 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  OK to stop continuous penicillin G for showers.             -ELOS/Goals: 9-12 days, SPV PT/OT - 4/25 DC date              -Continue CIR   - 4/8: Max  a bed mobility, STEDY - very fearful of falling. She did some marching at EOB today with the walker. Min A short distances goals. Mod A UB Max A LBD and toiletting; really limited by pain and ROM in her right arm.     4/15: Confusion has cleared per therapies. Bed mobility Mod A, transfers Mod A with SPT. Walking about 15 feet with Min A. SPV with WC. Husband has been very involved and very helpful. She has some ataxia and endurance difficulty. Setup to Min A with UB care, limited by R shoulder pain. Doing well with reacher.    2.  Antithrombotics: -DVT/anticoagulation:  Mechanical: Sequential compression devices, below knee Bilateral lower extremities  -01/13/24 DVT US neg              -antiplatelet therapy: N/A 3. Acute on chronic pain/Pain Management:  Was on Oxycodone 5 mg and Lyrica 75 mg daily PTA.  --Having surgical pain--continue Oxycodone prn. Pain worse at nights-->will schedule oxycodone at bedtime.  -Voltaren gel QID -4/7: DC morphine due to no utilization - 4/8: Oxy scheduled overnight and when taken in the a.m. causing some lethargy intermittently--patient has tolerated hydrocodone in the past, will switch to Norco 2.5 to 5 mg every 6 hours as needed.  Will also schedule Tylenol 500 mg 3 times daily. - 4/9: LFTS up; DC scheduled tylenol. Add oxy to allergy list d/t confusion. Doing much better with Norco - 4/10: Having some a.m. confusion with p.m. Norco.  Has had issues with tramadol in the past.  Patient is already using rarely, advised to continue this regimen and wean off as tolerated. 4-14: No further confusion reported.  DC naproxen as below.  4. Mood/Behavior/Sleep: LCSW to folow for evaluation and support.              -antipsychotic agents: N/A  -Cymbalta 60mg  nightly, mirapex 1mg  at bedtime, mirtazapine 30mg  nightly  -Patient reports sleeping well  5. Neuropsych/cognition: This patient is capable of making decisions on his own  behalf.  4/8: Having increased confusion, staring episodes without new neurologic deficits.  Adjusting pain medications as above.  Has had issues with hallucinations/confusion with cefepime in the past, would consider antibiotic adjustment if alternative causes ruled out.  4/9: episodes resolved with pain medication changes  6. Skin/Wound Care: Routine pressure relief measures. Monitor incision for healing.   4/7: Neurosurgery removed Hemovac over the weekend, current VAC with no drainage, if no further output in 24 hours will discuss removal with Dr. Delorise Shiner removal in 2 days per Dr. Yetta Barre  Wound VAC removed 4-10- -4-11: Limited visualizations due to TLSO when out of bed, nursing to place picture of wound in chart today.  7. Fluids/Electrolytes/Nutrition: Monitor I/O. Routine labs  -01/12/24 CMP stable today, Cr down to 1.55, AST 44, improving, monitor  4-7: LFTs mildly up trending, BUN and creatinine down, mild hyponatremia.  Limit Tylenol to 3000 mg daily as above.  Repeat in 2 to 3 days.  4/9: LFTS continue up; NA 131. See below  8. Neuropathy/band like pain around waist: Will increase Lyrica to TID 9. Osteomyelitis/discitis. Pending PICC placement. -- Starting 6 weeks of daptomycin and ertapenem for 6 weeks through 02/20/24 to be followed by PO suppression doxycycline and ciprofloxacin.  -01/13/24 Cx NGTD x4d--> 1 of 2 cultures grew cutibacterium acnes--covered with current antibiotics 4-7: ESR added on to a.m. labs.  CRP up today; discussed with ID, no new orders. 4-10: Infectious disease changing ertapenem to Cipro due to LFT  elevations as below.  Continue daptomycin; has had issues with vancomycin in the past 4-11: Daptomycin switched to continuous penicillin G yesterday afternoon.  Continuing Cipro.  Per infectious disease/pharmacy looking into insurance coverage for continuous penicillin G cassette versus switching to Rocephin prior to discharge.  If switching to Rocephin, asked to be  informed a few days prior so we could trial it at rehab, given significant side effects to cefepime in the past (more likely related to accumulation with AKI - not true allergy). 4/14: tolerating rocephin since Friday  10. HTN/edema:  Monitor BP TID--on torsemide 40mg  daily and coreg 6.125mg  BID (not reordered?). -4/5-6/25 BPs mostly fine, coreg not ordered as of now; remain off for now 4-7: Systolic low, p.o. intakes minimal.  Reduce torsemide for tomorrow a.m. to 20 mg, and encourage p.o. fluids today. 4-8: Diastolic remains low, will DC torsemide.  Schedule daily weights. 4/9: BP up a bit, continue to hold torsemide  4-10: Increased lower extremity edema, remains with diastolic hypotension but wishes to resume home torsemide; will resume at 20 mg daily.  Continue teds 4-11: Peripheral edema improved, patient tolerating well.  Continue current medications. -4/12-13/25 BPs great, monitor 4/14: Diastolic soft, but stable. Monitor with therapies.  4/15: Increasing peripheral edema; diastolic remains soft but asymptomatic.  Increase torsemide back to 40 mg daily, will give extra 20 mg dose today to catch up.  Monitor weights.  Vitals:   01/18/24 1550 01/18/24 1945 01/19/24 0431 01/19/24 1303  BP: (!) 132/58 (!) 126/53 (!) 134/58 130/74   01/19/24 2000 01/20/24 0507 01/20/24 1300 01/20/24 2030  BP: (!) 142/47 134/62 138/60 (!) 118/52   01/21/24 0452 01/21/24 1515 01/21/24 1945 01/22/24 0443  BP: (!) 144/66 (!) 112/57 (!) 104/51 132/62    Filed Weights   01/19/24 0521 01/20/24 0530 01/21/24 0604  Weight: 90.1 kg 90.5 kg 91.2 kg    11. T2DM: Hgb A1C-7.9. Used [name] 50-120/ 5 units ac meal TID at home.              --Continue insulin glargine 20 units daily with SSI.  -4/5-6/25 CBGs a bit better this weekend, monitor trend but may need further adjustments since they're still mostly >200 4/7: Blood sugars remain elevated, increase glargine to 25 units daily starting tomorrow a.m. 4/11: Blood  sugars uptrending with improved p.o.; resume home standing short acting insulin, started 3 units 3 times daily WC -4/14: CBG elevated, seems to have been better overall over the weekend so we will monitor for 24 hours and if remains consistently elevated will adjust insulin 4-15: Blood sugars still tending high, with highs to this a.m., increase glargine to 30 units daily CBG (last 3)  Recent Labs    01/21/24 1622 01/21/24 2035 01/22/24 0601  GLUCAP 183* 199* 226*    12.  Acute on CKD stage III--baseline creatinine 1.4: SCr has had rise to 1.56-->will d/c celebrex.  -01/12/24 Cr 1.55 today, down from 1.86 yesterday, monitor Monday  4-7, 4/9: BUN, creatinine improving. 4-11: BUN stable, creatinine slightly up to 1.26; monitor on Monday, may need to DC naprosyn if up trended -01/19/24 Cr up to 1.34, still better than prior and near her baseline; will decrease naprosyn to 250mg  BID for now, monitor on Monday labs.  4/14: Cr stable   13. Fever blisters: Will start valtrex 1g BID x1wk as symptomatic. Improving, EOT 4/11  14. Right shoulder pain: Question strain but heard a pop this am and extremely concerned that she may have torn her  RTC again --Voltaren gel for local measures.  -- Consider MRI if no improvement 4/8: R shoulder remains very painful/limited - will get x-ray and consider MRI if no acute findings. 4/9: Xray with mild changes; discussed with patient, unable to get MRI due to spinal cord stimulator leads, will order CT 4/10: CT nondiagnostic, some changes "suggestive of rotator cuff tear", mild before meals and glenohumeral degenerative changes.  Consulted orthopedics, appreciate Steffanie Edouard PA-C's evaluation, recommending no intervention at this time, added naproxen 500 mg twice daily. 4-14: Minimal improvements with naproxen, will DC to CKD as above   15. Constipation: Continue Senna 2 tabs daily--miralax was added on 04/04 -01/12/24 LBM yesterday, didn't get the 32oz miralax so  d/c'd. Continue miralax 17g daily for now.  -01/13/24 LBM last night, cont regimen as is for now, adjust accordingly 4-7: Increase Senokot-S to 2 tabs twice daily, MiraLAX BID 01/20/24 LBM yesterday, cont regimen  16. Depression/Anxiety: Continue Cymbalta 60mg  nightly and Mirtazapine 30mg  nightly.    17. Neurogenic bladder: Continue foley. D/c in am if has results with laxative tonight. -01/12/24 had BM last night, will pull foley, do timed toileting and PVRs q4-6h, ISC if PVR >384ml -01/13/24 urinating well, low PVRs, continue checking but could d/c tomorrow if still urinating fine.  4-7: Single episode of incontinence overnight, with PVR 170s.  Otherwise continent.  Continue current regimen.  18. ABLA: Hgb 10.5 01/12/24, monitor  -4-7: Hemoglobin down from 10.5-9.4; no overt signs of bleeding, repeat in 1 to 2 days  4-9: Hemoglobin stable, 8.8 -01/20/24 Hgb 8.1 on 4/11 but up to 8.3 on 4/12; monitor tomorrow since still fairly stable.   19. GERD: reordered Protonix 40mg  daily -4-10: Increase Protonix to 40 mg twice daily for GI prophylaxis with antibiotics and now naprosyn 4-14: Will reduce Protonix back to once daily with DC naproxen  20.  Transaminitis.  Uptrending on 4-7 labs.  -May be stasis related due to poor mobility; Tylenol utilization within parameters  -Repeat in 2 days  4/9: Increasing. DC standing tylenol. Get liver US  today, + GGT. Per pharmacy medications unlikely contributors.   4-10: Liver ultrasound without acute findings.  Continues to uptrend; discussed with pharmacy, infectious disease, feel this is close enough to her baseline elevations but no concern but will change ertapenem to Cipro.  4/11:  LFTs have stabilized.  Monitor.  -01/19/24 LFTs fairly stable overall, will change CMP to Monday instead of daily through the weekend. --ALP continues to uptrend, others approximately stable.  21. Hyponatremia.   - ?etiology; possible with abx but unlikely   - Holding torsemide as  above   - Add 1200 cc fluid restriciton   - Serum OSM + urine OSM, NA 4/9   - repeat in AM  4-10: Improved today, 132.  Will need to monitor closely with resumption of torsemide.  Add labs in a.m.  01/19/24 stable 133  4-14: Stable 135.  LOS: 11 days A FACE TO FACE EVALUATION WAS PERFORMED  Bea Lime 01/22/2024, 10:57 AM

## 2024-01-22 NOTE — Progress Notes (Signed)
 Occupational Therapy Session Note  Patient Details  Name: Katelyn [name] MRN: [id number] Date of Birth: Aug 08, 1956  Today's Date: 01/22/2024 OT Individual Time: 1034-1101 OT Individual Time Calculation (min): 27 min    Short Term Goals: Week 2:  OT Short Term Goal 1 (Week 2): Pt will perform hike/lowering of LB garments with Min A + RW. OT Short Term Goal 2 (Week 2): Pt will manage UB care with setup/supervision, incorporating compensatory techniques with Min cuing. OT Short Term Goal 3 (Week 2): Pt will perform LB bathing with consisten Min A + LRAD.  Skilled Therapeutic Interventions/Progress Updates:    Pt received sitting with 5/10 pain, premedicated, agreeable to OT session. Pt was taken via w/c to the therapy gym for time management. She completed several standing activities to address sit <> stand transfer and standing tolerance during ADLs. She required min A to stand with min cueing for hand placement. She tolerated 3-5 minutes at a time for 2 separate trials. She was passed off to PT in gym.   Therapy Documentation Precautions:  Precautions Precautions: Fall, Back Recall of Precautions/Restrictions: Intact (Pt able to recall 3/3 spinal precautions.) Precaution/Restrictions Comments: Verbally reviewed spinal precautions, re-educated on acronym BLT. Required Braces or Orthoses: Spinal Brace Spinal Brace: Thoracolumbosacral orthotic, Applied in sitting position Restrictions Weight Bearing Restrictions Per Provider Order: No  Therapy/Group: Individual Therapy  Una Ganser 01/22/2024, 10:50 AM

## 2024-01-22 NOTE — Progress Notes (Signed)
 Physical Therapy Session Note  Patient Details  Name: Katelyn [name] MRN: [id number] Date of Birth: 1956-01-04  Today's Date: 01/22/2024 PT Individual Time: 1105-1201 PT Individual Time Calculation (min): 56 min   Short Term Goals: Week 2:  PT Short Term Goal 1 (Week 2): Pt will complete bed mobility with mod assist consistently PT Short Term Goal 2 (Week 2): Pt will ambulate with RW 68' with mod assist and 2nd person WC follow PT Short Term Goal 3 (Week 2): Pt will complete stand pivot transfers bed<>WC with mod assist consistently  Skilled Therapeutic Interventions/Progress Updates:    Pt presents in day room seated in [facility], agreeable to PT. Pt reporting pain at this time in back, states she thinks its from sitting up in [facility] too long this AM, premedicated. Session focused on gait training, therapeutic activity for education on DME needs, and WC mobility training.  Pt provided with education on [facility] prescription and custom MWC vs PWC as well as standard WC based on pt progress with pt and pt husband verbalizing understanding. Pt then completes gait training with RW min assist and 2nd person WC follow 4' and 41' with pt completing 90* and 180* turns with RW. Pt demonstrating some excessive step length however minimal scissoring noted with straightaways. Pt then ambulates with RW 25' without WC follow min assist with x2 180* turns to sit in WC, pt demonstrating some mild scissoring with turns however no LOB, demonstrates increased trunk flexion with fatigue.  Therapist provides lightweight folding frame MWC to trial. Pt compeltes stand step transfer with min assist and RW to sit in WC. Pt then completes WC mobility self propelling with BUEs 175' with supervision, improved propulsion arc noted as well as maintenance of speed in lightweight chair vs standard WC. Pt reporting muscular fatigue in R shoulder however denies pain.  Pt completes stands from new WC with CGA to RW. Pt then completes pregait  activity completing forward/backward step x4 reps, requires seated rest break between reps. Pt with decreased foot clearance with backwards step.  Pt provided with seated rest breaks between all gait trials and exercises to promote energy conservation and quality with tasks. During rest breaks pt educated on progress with pt and pt husband verbalizing understanding.  Pt returns to room and remains seated in [facility] with all needs within reach, cal light in place, and pt husband at bedside at end of session.   Therapy Documentation Precautions:  Precautions Precautions: Fall, Back Recall of Precautions/Restrictions: Intact (Pt able to recall 3/3 spinal precautions.) Precaution/Restrictions Comments: Verbally reviewed spinal precautions, re-educated on acronym BLT. Required Braces or Orthoses: Spinal Brace Spinal Brace: Thoracolumbosacral orthotic, Applied in sitting position Restrictions Weight Bearing Restrictions Per Provider Order: No   Therapy/Group: Individual Therapy  Annia Kilts PT, DPT 01/22/2024, 5:20 PM

## 2024-01-22 NOTE — Patient Care Conference (Signed)
 Inpatient RehabilitationTeam Conference and Plan of Care Update Date: 01/22/2024   Time: 1057 am    Patient Name: Katelyn [name]      Medical Record Number: [id number]  Date of Birth: June 14, 1956 Sex: Female         Room/Bed: 6U44I/3K74Q-59 Payor Info: Payor: [employer] / Plan: [employer] PPO / Product Type: *No Product type* /    Admit Date/Time:  01/11/2024  6:53 PM  Primary Diagnosis:  Thoracic myelopathy  Hospital Problems: Principal Problem:   Thoracic myelopathy    Expected Discharge Date: Expected Discharge Date: 02/01/24  Team Members Present: Physician leading conference: Dr. Elijah Birk Social Worker Present: Cecile Sheerer, LCSWA Nurse Present: Konrad Dolores, RN PT Present: Darrold Span, PT OT Present: Lou Cal, OT SLP Present: Other (comment) Alvera Novel, SLP) PPS Coordinator present : Fae Pippin, SLP     Current Status/Progress Goal Weekly Team Focus  Bowel/Bladder   continent of bowel/bladder   remains continent   assess qshift    Swallow/Nutrition/ Hydration               ADL's   (P) Setup-Min A for UB care due to painful RUE ROM; Mod A for standing LB care due to BLE weakness and decrease balance with use of RW (heavy reliance on BUE).   Min A overall   Unilateral support on RW for LB care; AE training as appropriate, functional transfers, generalized strengthening    Mobility   bed mobility mod assist, transfers with mod assist with RW, sit<>stand min assist to RW, gait 15' with min assist and +2 WC follow   min assist, supervision WC  barriers: strength and endurance; focus on transfer training, WC mobility, endurance, NMR, gait    Communication                Safety/Cognition/ Behavioral Observations               Pain   pain managed with scheduled/prn meds   maintain no pain   assess pain qshift and prn    Skin   pt with surgical incision to back, intact   maintain skin integrity  assess  skin integrity qshit/PRN      Discharge Planning:  Pt will dc/ to home with her husband who will be primary cargeiver. Pt needs to be as independent as possible as he is not physically able to assist. IV abx until 5/4. [facility]- [facility] and [facility] for abx. SW will confirm there are no barriers to discharge.    Team Discussion: Patient was admitted post T7-L2 fusion and replacement of T10, L1, and T12 screws due to T10 spinal stenosis with myelopathy. Patient has osteomyelitis /discitis on IV antibiotics. Patient has surgical pain,  elevated blood sugars:medications adjusted by MD. Patient limited by anxiety, right shoulder pain and decreased strength of the lower extremities.   Patient on target to meet rehab goals: yes, Patient requires set-up -min A with upper body care, mod A for lower body care and requires mod assist with transfers. Patient able to ambulate up to 15' using a RW with minimal assistance and +2 WC follow . Overall goals at  discharge are set for minimal assistance -supervision.   *See Care Plan and progress notes for long and short-term goals.   Revisions to Treatment Plan:  N/a    Teaching Needs: Safety, medications, dietary recommendations, transfers, toileting, etc   Current Barriers to Discharge: Decreased caregiver support and IV antibiotics  Possible Resolutions to Barriers: Family Education      Medical Summary Current Status: medically complicated by poor pain control, constipation, anxiety, anemia, transaminitis, confusion, R shouler pain and diabetes  Barriers to Discharge: Behavior/Mood;Complicated Wound;Electrolyte abnormality;Medical stability;Morbid Obesity;Neurogenic Bowel & Bladder;Self-care education;Uncontrolled Pain   Possible Resolutions to [employer]: titrate pain medications to minimum tolerated doses for function, titrate bowel medications, monitor vitals and increase BP regimen as approrpiate, monitor labs for  tranraminitis and adjust medications as necessary, ortho assistance in managing R shoulder pain   Continued Need for Acute Rehabilitation Level of Care: The patient requires daily medical management by a physician with specialized training in physical medicine and rehabilitation for the following reasons: Direction of a multidisciplinary physical rehabilitation program to maximize functional independence : Yes Medical management of patient stability for increased activity during participation in an intensive rehabilitation regime.: Yes Analysis of laboratory values and/or radiology reports with any subsequent need for medication adjustment and/or medical intervention. : Yes   I attest that I was present, lead the team conference, and concur with the assessment and plan of the team.   Jerene Monks 01/22/2024, 1057 am

## 2024-01-22 NOTE — Plan of Care (Signed)
  Problem: Consults Goal: RH SPINAL CORD INJURY PATIENT EDUCATION Description:  See Patient Education module for education specifics.  Outcome: Progressing Goal: Skin Care Protocol Initiated - if Braden Score 18 or less Description: If consults are not indicated, leave blank or document N/A Outcome: Progressing Goal: Nutrition Consult-if indicated Outcome: Progressing Goal: Diabetes Guidelines if Diabetic/Glucose > 140 Description: If diabetic or lab glucose is > 140 mg/dl - Initiate Diabetes/Hyperglycemia Guidelines & Document Interventions  Outcome: Progressing   Problem: SCI BOWEL ELIMINATION Goal: RH STG MANAGE BOWEL WITH ASSISTANCE Description: STG Manage Bowel with minimal  Assistance. Outcome: Progressing Goal: RH STG SCI MANAGE BOWEL WITH MEDICATION WITH ASSISTANCE Description: STG SCI Manage bowel with medication with minimal assistance. Outcome: Progressing   Problem: SCI BLADDER ELIMINATION Goal: RH STG MANAGE BLADDER WITH ASSISTANCE Description: STG Manage Bladder With minimal Assistance Outcome: Progressing Goal: RH STG MANAGE BLADDER WITH MEDICATION WITH ASSISTANCE Description: STG Manage Bladder With Medication With minimal Assistance. Outcome: Progressing   Problem: RH SKIN INTEGRITY Goal: RH STG SKIN FREE OF INFECTION/BREAKDOWN Outcome: Progressing Goal: RH STG MAINTAIN SKIN INTEGRITY WITH ASSISTANCE Description: STG Maintain Skin Integrity With Assistance. Outcome: Progressing Goal: RH STG ABLE TO PERFORM INCISION/WOUND CARE W/ASSISTANCE Description: STG Able To Perform Incision/Wound Care With Assistance. Outcome: Progressing   Problem: RH SAFETY Goal: RH STG ADHERE TO SAFETY PRECAUTIONS W/ASSISTANCE/DEVICE Description: STG Adhere to Safety Precautions With minimal Assistance/Device. Outcome: Progressing Goal: RH STG DECREASED RISK OF FALL WITH ASSISTANCE Description: STG Decreased Risk of Fall With minimal Assistance. Outcome: Progressing   Problem:  RH PAIN MANAGEMENT Goal: RH STG PAIN MANAGED AT OR BELOW PT'S PAIN GOAL Description: <4 w/ prns Outcome: Progressing   Problem: RH KNOWLEDGE DEFICIT SCI Goal: RH STG INCREASE KNOWLEDGE OF SELF CARE AFTER SCI Outcome: Progressing   Problem: Consults Goal: RH SPINAL CORD INJURY PATIENT EDUCATION Description:  See Patient Education module for education specifics.  Outcome: Progressing   Problem: SCI BOWEL ELIMINATION Goal: RH STG MANAGE BOWEL WITH ASSISTANCE Description: STG Manage Bowel with Assistance. Outcome: Progressing   Problem: SCI BLADDER ELIMINATION Goal: RH STG MANAGE BLADDER WITH ASSISTANCE Description: STG Manage Bladder With Assistance Outcome: Progressing   Problem: RH SKIN INTEGRITY Goal: RH STG SKIN FREE OF INFECTION/BREAKDOWN Outcome: Progressing   Problem: RH SAFETY Goal: RH STG ADHERE TO SAFETY PRECAUTIONS W/ASSISTANCE/DEVICE Description: STG Adhere to Safety Precautions With minimal  Assistance/Device. Outcome: Progressing   Problem: RH PAIN MANAGEMENT Goal: RH STG PAIN MANAGED AT OR BELOW PT'S PAIN GOAL Outcome: Progressing   Problem: RH KNOWLEDGE DEFICIT SCI Goal: RH STG INCREASE KNOWLEDGE OF SELF CARE AFTER SCI Description: Manage increase knowledge of self care \\after  SCI  SCI with minimal assistance  using educational materials provided Outcome: Progressing

## 2024-01-23 LAB — GLUCOSE, CAPILLARY
Glucose-Capillary: 233 mg/dL — ABNORMAL HIGH (ref 70–99)
Glucose-Capillary: 254 mg/dL — ABNORMAL HIGH (ref 70–99)
Glucose-Capillary: 190 mg/dL — ABNORMAL HIGH (ref 70–99)
Glucose-Capillary: 200 mg/dL — ABNORMAL HIGH (ref 70–99)

## 2024-01-23 MED ORDER — OXYBUTYNIN CHLORIDE 5 MG PO TABS
2.5000 mg | ORAL_TABLET | Freq: Two times a day (BID) | ORAL | Status: DC
  Administered 2024-01-23 – 2024-01-24 (×3): 2.5 mg via ORAL
  Filled 2024-01-23 (×3): qty 1

## 2024-01-23 NOTE — Progress Notes (Signed)
 Physical Therapy Session Note  Patient Details  Name: Katelyn [name] MRN: [id number] Date of Birth: [birth year]  Today's Date: 01/23/2024 PT Individual Time: 1006-1116 PT Individual Time Calculation (min): 70 min   Short Term Goals: Week 2:  PT Short Term Goal 1 (Week 2): Pt will complete bed mobility with mod assist consistently PT Short Term Goal 2 (Week 2): Pt will ambulate with RW 32' with mod assist and 2nd person WC follow PT Short Term Goal 3 (Week 2): Pt will complete stand pivot transfers bed<>WC with mod assist consistently  Skilled Therapeutic Interventions/Progress Updates:    Pt presents in room seated on BSC, agreeable to PT. Pt reporting increased pain in L back, states feels that it's from completing bed mobility with night nursing staff. Session focused on therapeutic activities for educating pt on body mechanics, muscles and muscle attachments, pain education, as well as gait training, therapeutic exercise, and WC mobility.  Pt completes WC mobility 150' with supervision, provided with gloves to improve grip on WC push rims. Pt able to manage brakes with supervision. Pt concerned about pain in back and provided with extensive education on pain, pain management, muscle attachments, soreness with mobility with pt verbalizing understanding. Pt completes sit to stand to RW with min assist, begins to ambulate and pt reporting increased pain in L low back with excessive step with RLE, provided with cue to shorten step length with pt reporting no pain. Pt ambulates 5' to mat with min assist, takes seated rest break, then ambulates 10' with RW min assist to nustep.  Pt completes continuous training 10 minutes on nustep BUE/BLE, pt unable to correct for excessive hip ER despite max verbal/tactile cues, provided with resistance band around knees to promote neutral hip alignment. Pt completes on L1, completed for BUE/BLE strengthening, decrease pain, and for activity tolerance.  Pt ambulates  back to WC ~15' with RW min assist, demonstrating improved step length but increased trunk flexion and decreased terminal hip extension with gait. Pt self propels back to room with supervision, therapist assist with positioning WC next to bed. Pt completes stand pivot transfer to sit on EOB with min assist. Pt provided with verbal/visual demonstration of log roll technique, pt requires max asssit for sit to supine with hospital bed features due to BLE weakness however maintains log roll. Once in supine pt with sharp pain in L low back causing verbal outburst of pain. Pt assisted back to sitting via log roll with mod assist, pt reporting decrease in L low back pain once seated upright. Pt completes stand step transfer back to [facility] with min assist. Pt remains seated in [facility] with all needs within reach, cal light in place at end of session.   Therapy Documentation Precautions:  Precautions Precautions: Fall, Back Recall of Precautions/Restrictions: Intact (Pt able to recall 3/3 spinal precautions.) Precaution/Restrictions Comments: Verbally reviewed spinal precautions, re-educated on acronym BLT. Required Braces or Orthoses: Spinal Brace Spinal Brace: Thoracolumbosacral orthotic, Applied in sitting position Restrictions Weight Bearing Restrictions Per Provider Order: No   Therapy/Group: Individual Therapy  Annia Kilts PT, DPT 01/23/2024, 11:20 AM

## 2024-01-23 NOTE — Plan of Care (Signed)
 Pt is A&O x 4. VSS, on room air. OOB to bedside commode with 2 assist, with walker. C/o back pain and prn med given as ordered. Safety maintained. Bed alarm on. Call bell in reach. Will continue to monitor.   Problem: Consults Goal: RH SPINAL CORD INJURY PATIENT EDUCATION Description:  See Patient Education module for education specifics.  Outcome: Progressing Goal: Skin Care Protocol Initiated - if Braden Score 18 or less Description: If consults are not indicated, leave blank or document N/A Outcome: Progressing Goal: Nutrition Consult-if indicated Outcome: Progressing Goal: Diabetes Guidelines if Diabetic/Glucose > 140 Description: If diabetic or lab glucose is > 140 mg/dl - Initiate Diabetes/Hyperglycemia Guidelines & Document Interventions  Outcome: Progressing   Problem: SCI BOWEL ELIMINATION Goal: RH STG MANAGE BOWEL WITH ASSISTANCE Description: STG Manage Bowel with minimal  Assistance. Outcome: Progressing Goal: RH STG SCI MANAGE BOWEL WITH MEDICATION WITH ASSISTANCE Description: STG SCI Manage bowel with medication with minimal assistance. Outcome: Progressing   Problem: SCI BLADDER ELIMINATION Goal: RH STG MANAGE BLADDER WITH ASSISTANCE Description: STG Manage Bladder With minimal Assistance Outcome: Progressing Goal: RH STG MANAGE BLADDER WITH MEDICATION WITH ASSISTANCE Description: STG Manage Bladder With Medication With minimal Assistance. Outcome: Progressing   Problem: RH SKIN INTEGRITY Goal: RH STG SKIN FREE OF INFECTION/BREAKDOWN Outcome: Progressing Goal: RH STG MAINTAIN SKIN INTEGRITY WITH ASSISTANCE Description: STG Maintain Skin Integrity With Assistance. Outcome: Progressing Goal: RH STG ABLE TO PERFORM INCISION/WOUND CARE W/ASSISTANCE Description: STG Able To Perform Incision/Wound Care With Assistance. Outcome: Progressing   Problem: RH SAFETY Goal: RH STG ADHERE TO SAFETY PRECAUTIONS W/ASSISTANCE/DEVICE Description: STG Adhere to Safety  Precautions With minimal Assistance/Device. Outcome: Progressing Goal: RH STG DECREASED RISK OF FALL WITH ASSISTANCE Description: STG Decreased Risk of Fall With minimal Assistance. Outcome: Progressing   Problem: RH PAIN MANAGEMENT Goal: RH STG PAIN MANAGED AT OR BELOW PT'S PAIN GOAL Description: <4 w/ prns Outcome: Progressing   Problem: RH KNOWLEDGE DEFICIT SCI Goal: RH STG INCREASE KNOWLEDGE OF SELF CARE AFTER SCI Outcome: Progressing   Problem: Consults Goal: RH SPINAL CORD INJURY PATIENT EDUCATION Description:  See Patient Education module for education specifics.  Outcome: Progressing   Problem: SCI BOWEL ELIMINATION Goal: RH STG MANAGE BOWEL WITH ASSISTANCE Description: STG Manage Bowel with Assistance. Outcome: Progressing   Problem: SCI BLADDER ELIMINATION Goal: RH STG MANAGE BLADDER WITH ASSISTANCE Description: STG Manage Bladder With Assistance Outcome: Progressing   Problem: RH SKIN INTEGRITY Goal: RH STG SKIN FREE OF INFECTION/BREAKDOWN Outcome: Progressing   Problem: RH SAFETY Goal: RH STG ADHERE TO SAFETY PRECAUTIONS W/ASSISTANCE/DEVICE Description: STG Adhere to Safety Precautions With minimal  Assistance/Device. Outcome: Progressing   Problem: RH PAIN MANAGEMENT Goal: RH STG PAIN MANAGED AT OR BELOW PT'S PAIN GOAL Outcome: Progressing   Problem: RH KNOWLEDGE DEFICIT SCI Goal: RH STG INCREASE KNOWLEDGE OF SELF CARE AFTER SCI Description: Manage increase knowledge of self care \\after  SCI  SCI with minimal assistance  using educational materials provided Outcome: Progressing

## 2024-01-23 NOTE — Progress Notes (Signed)
 Occupational Therapy Session Note  Patient Details  Name: Katelyn [name] MRN: [id number] Date of Birth: 04-Aug-1956  Today's Date: 01/23/2024 OT Individual Time: 0803-0900 OT Individual Time Calculation (min): 57 min   Today's Date: 01/23/2024 OT Individual Time: 4010-2725 OT Individual Time Calculation (min): 70 min   Short Term Goals: Week 2:  OT Short Term Goal 1 (Week 2): Pt will perform hike/lowering of LB garments with Min A + RW. OT Short Term Goal 2 (Week 2): Pt will manage UB care with setup/supervision, incorporating compensatory techniques with Min cuing. OT Short Term Goal 3 (Week 2): Pt will perform LB bathing with consisten Min A + LRAD.  Skilled Therapeutic Interventions/Progress Updates:   Session 1:  Pt received resting in bed, un-rated back pain, rest provided as needed. Pt reports increased difficulty/pain with bed mobility during night-time care, verbally reviewed log-roll technique. Sign posted above bed. Pt performs supine>EOB with multimodal cuing for implementation of log roll, as patient reaches bed rail before adjusting BLE resulting in trunk twisting. Min A provided for BLE adjustments, pt able to elevate trunk with CGA this session. Stand-step from EOB>WC with CGA, Min A for initial sit<>stand. Pt completes 3/3 toileting activities with Min A for thoroughness of posterior pericare and light management when hiking LB garments. Pt performs lateral leans to initiate pericare, but reports both bathrooms at home have bidets. BSC>WC stand-step transfer with Min A, x1 minimal LOB due to BLE entanglement. Sink-side care with distant supervision progressing to Mod I. Pt remained sitting in WC, all immediate needs met, call bell within reach.   Session 2:  Pt received sitting on BSC, nursing awaiting patient to finish toileting, increased reports on pain at surgical site, present lateral L side. Rest and positioning suggestions provided as needed. Pt performs stand-step  transfers this session with CGA-Min A + RW, cuing provided RLE coordination, benefiting from cue to point RLE towards R-RW wheel. Pt self-propels from room<>main therapy gym with supervision, increased time for management of objects. Pt performs sit>supine at [facility] with Mod A for BLE management, improved carryover of log roll technique.   Pt instructed in series of BLE strengthening exercises for increased independence with functional transfers, standing ADLs, and decrease fall risk, details below:  Leg lifts: Pt able to reach ~45 degrees of active R hip flexion, requiring assistance to reach ~90 degrees. Focus placed on eccentric control, pt requiring Min-Mod A to manage the above.  Heel slides  Pt performs 2x5 reps of the above, multimodal cuing provided for correct form.   Back in patient room, pt assisted with bathing of back to address increased itchiness, sit>supine in similar fashion as previously described. Pt remained resting in bed, all immediate needs met, call bell within reach.   Therapy Documentation Precautions:  Precautions Precautions: Fall, Back Recall of Precautions/Restrictions: Intact (Pt able to recall 3/3 spinal precautions.) Precaution/Restrictions Comments: Verbally reviewed spinal precautions, re-educated on acronym BLT. Required Braces or Orthoses: Spinal Brace Spinal Brace: Thoracolumbosacral orthotic, Applied in sitting position Restrictions Weight Bearing Restrictions Per Provider Order: No   Therapy/Group: Individual Therapy  Artemus Biles, OTR/L, MSOT  01/23/2024, 6:33 AM

## 2024-01-23 NOTE — Progress Notes (Signed)
 PROGRESS NOTE   Subjective/Complaints: Patient with mental comfortable complaints, concerns today.  Does endorse some urge incontinence, which was ongoing prior to hospitalization but has been worsened somewhat with her immobility.  Has never been on medications for this.  Was evaluated by urologist once, told that she had a weak pelvic floor, and told to perform Kegel's.  BP well-controlled with resumption of home torsemide dosing.  Continuing with 5 out of 10 pain consistently.  BG remain relatively elevated, tending around 200s.   ROS: as per HPI. Denies CP, SOB, abd pain, N/V/D/C, or any other complaints at this time.    + R shoulder pain--ongoing. + Lower extremity edema--improved + Urge incontinence-chronic, somewhat worsened  Objective:   No results found.   Recent Labs    01/21/24 0411  WBC 4.1  HGB 9.0*  HCT 27.7*  PLT 160   Recent Labs    01/21/24 0411  NA 135  K 4.4  CL 98  CO2 27  GLUCOSE 256*  BUN 26*  CREATININE 1.34*  CALCIUM 8.7*      Intake/Output Summary (Last 24 hours) at 01/23/2024 1610 Last data filed at 01/22/2024 1318 Gross per 24 hour  Intake 240 ml  Output --  Net 240 ml        Physical Exam: Vital Signs Blood pressure (!) 132/53, pulse 77, temperature 98 F (36.7 C), temperature source Oral, resp. rate 17, height 5\' 1"  (1.549 m), weight 88.9 kg, SpO2 94%.  Constitutional: No apparent distress. Appropriate appearance for age. +Obese.  Bedside table.  HENT: No JVD. Neck Supple. Trachea midline. Eyes: PERRLA. EOMI. Cardiovascular: RRR, no murmurs/rub/gallops.  2+ bilateral LE Edema. Peripheral pulses 2+  Respiratory: CTAB. No rales, rhonchi, or wheezing. On RA.  Abdomen: Nondistended.  Obstructed by TLSO. Skin: PICC line clean, dry, intact.  Small amount of blood around site. Back incision covered, dressing c/d/I--see below   MSK:      No apparent deformity. + RUE  shoulder abduction/flexion limited to <70 degrees: Unchanged--not examined for-16       Neurologic exam:  Cognition: AAO to person, place, time and event.  Language: Fluent, No substitutions or neoglisms. No dysarthria.  Memory: No apparent deficits  Insight: Good insight into current condition.  Mood: Pleasant affect, appropriate mood.   Sensation: To light touch reduced in distal fingertips and toes bilaterally; sensation improving at bilateral knees Reflexes: 2+ in BL UE and lower extremities CN: 2-12 grossly intact.  Coordination: No apparent tremors. No ataxia on FTN bilaterally.  Spasticity: MAS 0 in all extremities.       Strength:                RUE: 4-/5 SA, 5/5 EF, 5/5 EE, 5/5 WE, 5/5 FF, 5/5 FA                LUE:  5/5 SA, 5/5 EF, 5/5 EE, 5/5 WE, 5/5 FF, 5/5 FA                RLE: 3+/5 HF, 4-/5 KE, 3-/5  DF, 2/5  EHL, 3/5  PF  LLE:  3-/5 HF, 4-/5 KE, 5-/5  DF, 5-/5  EHL, 5-/5  PF   No significant changes 4-16  Assessment/Plan: 1. Functional deficits which require 3+ hours per day of interdisciplinary therapy in a comprehensive inpatient rehab setting. Physiatrist is providing close team supervision and 24 hour management of active medical problems listed below. Physiatrist and rehab team continue to assess barriers to discharge/monitor patient progress toward functional and medical goals  Care Tool:  Bathing    Body parts bathed by patient: Right arm, Chest, Abdomen, Right upper leg, Left upper leg, Face   Body parts bathed by helper: Left arm, Front perineal area, Buttocks, Right lower leg, Left lower leg     Bathing assist Assist Level: Moderate Assistance - Patient 50 - 74%     Upper Body Dressing/Undressing Upper body dressing   What is the patient wearing?: Button up shirt    Upper body assist Assist Level: Moderate Assistance - Patient 50 - 74%    Lower Body Dressing/Undressing Lower body dressing      What is the patient wearing?:  Pants     Lower body assist Assist for lower body dressing: Total Assistance - Patient < 25%     Toileting Toileting    Toileting assist Assist for toileting: 2 Helpers     Transfers Chair/bed transfer  Transfers assist     Chair/bed transfer assist level: Maximal Assistance - Patient 25 - 49%     Locomotion Ambulation   Ambulation assist   Ambulation activity did not occur: Safety/medical concerns (weakness, pain, decreased balance)          Walk 10 feet activity   Assist  Walk 10 feet activity did not occur: Safety/medical concerns (weakness, pain, decreased balance)        Walk 50 feet activity   Assist Walk 50 feet with 2 turns activity did not occur: Safety/medical concerns (weakness, pain, decreased balance)         Walk 150 feet activity   Assist Walk 150 feet activity did not occur: Safety/medical concerns (weakness, pain, decreased balance)         Walk 10 feet on uneven surface  activity   Assist Walk 10 feet on uneven surfaces activity did not occur: Safety/medical concerns (weakness, pain, decreased balance)         Wheelchair     Assist Is the patient using a wheelchair?: Yes Type of Wheelchair: Manual    Wheelchair assist level: Supervision/Verbal cueing Max wheelchair distance: 47'    Wheelchair 50 feet with 2 turns activity    Assist        Assist Level: Supervision/Verbal cueing   Wheelchair 150 feet activity     Assist      Assist Level: Dependent - Patient 0%   Blood pressure (!) 132/53, pulse 77, temperature 98 F (36.7 C), temperature source Oral, resp. rate 17, height 5\' 1"  (1.549 m), weight 88.9 kg, SpO2 94%.  Medical Problem List and Plan: 1. 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  OK to stop continuous  penicillin G for showers.             -ELOS/Goals: 9-12 days, SPV PT/OT - 4/25 DC date             -  Continue CIR   - 4/8: Max a bed mobility, STEDY - very fearful of falling. She did some marching at EOB today with the walker. Min A short distances goals. Mod A UB Max A LBD and toiletting; really limited by pain and ROM in her right arm.     4/15: Confusion has cleared per therapies. Bed mobility Mod A, transfers Mod A with SPT. Walking about 15 feet with Min A. SPV with WC. Husband has been very involved and very helpful. She has some ataxia and endurance difficulty. Setup to Min A with UB care, limited by R shoulder pain. Doing well with reacher.    2.  Antithrombotics: -DVT/anticoagulation:  Mechanical: Sequential compression devices, below knee Bilateral lower extremities  -01/13/24 DVT US neg              -antiplatelet therapy: N/A 3. Acute on chronic pain/Pain Management:  Was on Oxycodone 5 mg and Lyrica 75 mg daily PTA.  --Having surgical pain--continue Oxycodone prn. Pain worse at nights-->will schedule oxycodone at bedtime.  -Voltaren gel QID -4/7: DC morphine due to no utilization - 4/8: Oxy scheduled overnight and when taken in the a.m. causing some lethargy intermittently--patient has tolerated hydrocodone in the past, will switch to Norco 2.5 to 5 mg every 6 hours as needed.  Will also schedule Tylenol 500 mg 3 times daily. - 4/9: LFTS up; DC scheduled tylenol. Add oxy to allergy list d/t confusion. Doing much better with Norco - 4/10: Having some a.m. confusion with p.m. Norco.  Has had issues with tramadol in the past.  Patient is already using rarely, advised to continue this regimen and wean off as tolerated. 4-14: No further confusion reported.  DC naproxen as below.  4. Mood/Behavior/Sleep: LCSW to folow for evaluation and support.              -antipsychotic agents: N/A  -Cymbalta 60mg  nightly, mirapex 1mg  at bedtime, mirtazapine 30mg  nightly  -Patient reports sleeping  well  5. Neuropsych/cognition: This patient is capable of making decisions on his own behalf.  4/8: Having increased confusion, staring episodes without new neurologic deficits.  Adjusting pain medications as above.  Has had issues with hallucinations/confusion with cefepime in the past, would consider antibiotic adjustment if alternative causes ruled out.  4/9: episodes resolved with pain medication changes  6. Skin/Wound Care: Routine pressure relief measures. Monitor incision for healing.   4/7: Neurosurgery removed Hemovac over the weekend, current VAC with no drainage, if no further output in 24 hours will discuss removal with Dr. Delorise Shiner removal in 2 days per Dr. Yetta Barre  Wound VAC removed 4-10- -4-11: Limited visualizations due to TLSO when out of bed, nursing to place picture of wound in chart today.  7. Fluids/Electrolytes/Nutrition: Monitor I/O. Routine labs  -01/12/24 CMP stable today, Cr down to 1.55, AST 44, improving, monitor  4-7: LFTs mildly up trending, BUN and creatinine down, mild hyponatremia.  Limit Tylenol to 3000 mg daily as above.  Repeat in 2 to 3 days.  4/9: LFTS continue up; NA 131. See below  8. Neuropathy/band like pain around waist: Will increase Lyrica to TID 9. Osteomyelitis/discitis. Pending PICC placement. -- Starting 6 weeks of daptomycin and ertapenem for 6 weeks through 02/20/24 to be followed by PO suppression doxycycline and ciprofloxacin.  -01/13/24 Cx NGTD x4d--> 1 of 2 cultures grew cutibacterium acnes--covered with current antibiotics 4-7: ESR added on to a.m. labs.  CRP up today; discussed with ID, no new orders. 4-10: Infectious disease  changing ertapenem to Cipro due to LFT elevations as below.  Continue daptomycin; has had issues with vancomycin in the past 4-11: Daptomycin switched to continuous penicillin G yesterday afternoon.  Continuing Cipro.  Per infectious disease/pharmacy looking into insurance coverage for continuous penicillin G cassette  versus switching to Rocephin prior to discharge.  If switching to Rocephin, asked to be informed a few days prior so we could trial it at rehab, given significant side effects to cefepime in the past (more likely related to accumulation with AKI - not true allergy). 4/14: tolerating rocephin since Friday  10. HTN/edema:  Monitor BP TID--on torsemide 40mg  daily and coreg 6.125mg  BID (not reordered?). -4/5-6/25 BPs mostly fine, coreg not ordered as of now; remain off for now 4-7: Systolic low, p.o. intakes minimal.  Reduce torsemide for tomorrow a.m. to 20 mg, and encourage p.o. fluids today. 4-8: Diastolic remains low, will DC torsemide.  Schedule daily weights. 4/9: BP up a bit, continue to hold torsemide  4-10: Increased lower extremity edema, remains with diastolic hypotension but wishes to resume home torsemide; will resume at 20 mg daily.  Continue teds 4-11: Peripheral edema improved, patient tolerating well.  Continue current medications. -4/12-13/25 BPs great, monitor 4/14: Diastolic soft, but stable. Monitor with therapies.  4/15: Increasing peripheral edema; diastolic remains soft but asymptomatic.  Increase torsemide back to 40 mg daily, will give extra 20 mg dose today to catch up.  Monitor weights. 4-16: Weights down significantly.  BP tolerating current dose of torsemide.  Continue current medications.  Vitals:   01/19/24 1303 01/19/24 2000 01/20/24 0507 01/20/24 1300  BP: 130/74 (!) 142/47 134/62 138/60   01/20/24 2030 01/21/24 0452 01/21/24 1515 01/21/24 1945  BP: (!) 118/52 (!) 144/66 (!) 112/57 (!) 104/51   01/22/24 0443 01/22/24 1331 01/22/24 1918 01/23/24 0500  BP: 132/62 100/77 (!) 131/56 (!) 132/53    Filed Weights   01/20/24 0530 01/21/24 0604 01/23/24 0500  Weight: 90.5 kg 91.2 kg 88.9 kg    11. T2DM: Hgb A1C-7.9. Used [name] 50-120/ 5 units ac meal TID at home.              --Continue insulin glargine 20 units daily with SSI.  -4/5-6/25 CBGs a bit better this  weekend, monitor trend but may need further adjustments since they're still mostly >200 4/7: Blood sugars remain elevated, increase glargine to 25 units daily starting tomorrow a.m. 4/11: Blood sugars uptrending with improved p.o.; resume home standing short acting insulin, started 3 units 3 times daily WC -4/14: CBG elevated, seems to have been better overall over the weekend so we will monitor for 24 hours and if remains consistently elevated will adjust insulin 4-15: Blood sugars still tending high, with highs to this a.m., increase glargine to 30 units daily-- monitor increase 4-16 CBG (last 3)  Recent Labs    01/22/24 1651 01/22/24 2115 01/23/24 0607  GLUCAP 171* 186* 233*    12.  Acute on CKD stage III--baseline creatinine 1.4: SCr has had rise to 1.56-->will d/c celebrex.  -01/12/24 Cr 1.55 today, down from 1.86 yesterday, monitor Monday  4-7, 4/9: BUN, creatinine improving. 4-11: BUN stable, creatinine slightly up to 1.26; monitor on Monday, may need to DC naprosyn if up trended -01/19/24 Cr up to 1.34, still better than prior and near her baseline; will decrease naprosyn to 250mg  BID for now, monitor on Monday labs.  4/14: Cr stable   13. Fever blisters: Will start valtrex 1g BID x1wk as symptomatic. Improving,  EOT 4/11  14. Right shoulder pain: Question strain but heard a pop this am and extremely concerned that she may have torn her RTC again --Voltaren gel for local measures.  -- Consider MRI if no improvement 4/8: R shoulder remains very painful/limited - will get x-ray and consider MRI if no acute findings. 4/9: Xray with mild changes; discussed with patient, unable to get MRI due to spinal cord stimulator leads, will order CT 4/10: CT nondiagnostic, some changes "suggestive of rotator cuff tear", mild before meals and glenohumeral degenerative changes.  Consulted orthopedics, appreciate Earney Hamburg PA-C's evaluation, recommending no intervention at this time, added naproxen  500 mg twice daily. 4-14: Minimal improvements with naproxen, will DC to CKD as above   15. Constipation: Continue Senna 2 tabs daily--miralax was added on 04/04 -01/12/24 LBM yesterday, didn't get the 32oz miralax so d/c'd. Continue miralax 17g daily for now.  -01/13/24 LBM last night, cont regimen as is for now, adjust accordingly 4-7: Increase Senokot-S to 2 tabs twice daily, MiraLAX BID 01/20/24 LBM yesterday, cont regimen  16. Depression/Anxiety: Continue Cymbalta 60mg  nightly and Mirtazapine 30mg  nightly.    17. Neurogenic bladder: Continue foley. D/c in am if has results with laxative tonight. -01/12/24 had BM last night, will pull foley, do timed toileting and PVRs q4-6h, ISC if PVR >367ml -01/13/24 urinating well, low PVRs, continue checking but could d/c tomorrow if still urinating fine.  4-7: Single episode of incontinence overnight, with PVR 170s.  Otherwise continent.  Continue current regimen.  18. ABLA: Hgb 10.5 01/12/24, monitor  -4-7: Hemoglobin down from 10.5-9.4; no overt signs of bleeding, repeat in 1 to 2 days  4-9: Hemoglobin stable, 8.8 -01/20/24 Hgb 8.1 on 4/11 but up to 8.3 on 4/12; monitor tomorrow since still fairly stable.   19. GERD: reordered Protonix 40mg  daily -4-10: Increase Protonix to 40 mg twice daily for GI prophylaxis with antibiotics and now naprosyn 4-14: Will reduce Protonix back to once daily with DC naproxen  20.  Transaminitis.  Uptrending on 4-7 labs.  -May be stasis related due to poor mobility; Tylenol utilization within parameters  -Repeat in 2 days  4/9: Increasing. DC standing tylenol. Get liver US today, + GGT. Per pharmacy medications unlikely contributors.   4-10: Liver ultrasound without acute findings.  Continues to uptrend; discussed with pharmacy, infectious disease, feel this is close enough to her baseline elevations but no concern but will change ertapenem to Cipro.  4/11:  LFTs have stabilized.  Monitor.  -01/19/24 LFTs fairly stable  overall, will change CMP to Monday instead of daily through the weekend. --ALP continues to uptrend, others approximately stable.  21. Hyponatremia.   - ?etiology; possible with abx but unlikely   - Holding torsemide as above   - Add 1200 cc fluid restriciton   - Serum OSM + urine OSM, NA 4/9   - repeat in AM  4-10: Improved today, 132.  Will need to monitor closely with resumption of torsemide.  Add labs in a.m.  01/19/24 stable 133  4-14: Stable 135.  22. Urge incontinence   - 4/16: start ditropan 2.5 mg BID   - Q6H bladder scans LOS: 12 days A FACE TO FACE EVALUATION WAS PERFORMED  [name] 01/23/2024, 8:12 AM

## 2024-01-24 LAB — CBC
HCT: 25.1 % — ABNORMAL LOW (ref 36.0–46.0)
Hemoglobin: 8.2 g/dL — ABNORMAL LOW (ref 12.0–15.0)
MCH: 29.9 pg (ref 26.0–34.0)
MCHC: 32.7 g/dL (ref 30.0–36.0)
MCV: 91.6 fL (ref 80.0–100.0)
Platelets: 157 10*3/uL (ref 150–400)
RBC: 2.74 MIL/uL — ABNORMAL LOW (ref 3.87–5.11)
RDW: 14.9 % (ref 11.5–15.5)
WBC: 3.8 10*3/uL — ABNORMAL LOW (ref 4.0–10.5)
nRBC: 0 % (ref 0.0–0.2)

## 2024-01-24 LAB — GLUCOSE, CAPILLARY
Glucose-Capillary: 158 mg/dL — ABNORMAL HIGH (ref 70–99)
Glucose-Capillary: 293 mg/dL — ABNORMAL HIGH (ref 70–99)
Glucose-Capillary: 210 mg/dL — ABNORMAL HIGH (ref 70–99)
Glucose-Capillary: 232 mg/dL — ABNORMAL HIGH (ref 70–99)

## 2024-01-24 MED ORDER — INFLUENZA VAC A&B SURF ANT ADJ 0.5 ML IM SUSY
0.5000 mL | PREFILLED_SYRINGE | INTRAMUSCULAR | Status: DC
  Filled 2024-01-24: qty 0.5

## 2024-01-24 MED ORDER — INSULIN GLARGINE-YFGN 100 UNIT/ML ~~LOC~~ SOLN
35.0000 [IU] | Freq: Every day | SUBCUTANEOUS | Status: DC
  Administered 2024-01-25 – 2024-01-26 (×2): 35 [IU] via SUBCUTANEOUS
  Filled 2024-01-24 (×3): qty 0.35

## 2024-01-24 MED ORDER — INSULIN ASPART 100 UNIT/ML IJ SOLN
5.0000 [IU] | Freq: Three times a day (TID) | INTRAMUSCULAR | Status: DC
  Administered 2024-01-24 – 2024-01-29 (×15): 5 [IU] via SUBCUTANEOUS

## 2024-01-24 MED ORDER — PNEUMOCOCCAL 20-VAL CONJ VACC 0.5 ML IM SUSY
0.5000 mL | PREFILLED_SYRINGE | INTRAMUSCULAR | Status: DC
  Filled 2024-01-24: qty 0.5

## 2024-01-24 MED ORDER — OXYBUTYNIN CHLORIDE 5 MG PO TABS
5.0000 mg | ORAL_TABLET | Freq: Two times a day (BID) | ORAL | Status: DC
  Administered 2024-01-24 – 2024-01-25 (×2): 5 mg via ORAL
  Filled 2024-01-24 (×2): qty 1

## 2024-01-24 NOTE — Progress Notes (Signed)
 Occupational Therapy Session Note  Patient Details  Name: Katelyn [name] MRN: [id number] Date of Birth: Dec 21, 1955  Today's Date: 01/24/2024 OT Individual Time: 1005-1047 OT Individual Time Calculation (min): 42 min   Today's Date: 01/24/2024 OT Individual Time: 1450-1530 OT Individual Time Calculation (min): 40 min   Short Term Goals: Week 2:  OT Short Term Goal 1 (Week 2): Pt will perform hike/lowering of LB garments with Min A + RW. OT Short Term Goal 2 (Week 2): Pt will manage UB care with setup/supervision, incorporating compensatory techniques with Min cuing. OT Short Term Goal 3 (Week 2): Pt will perform LB bathing with consisten Min A + LRAD.  Skilled Therapeutic Interventions/Progress Updates:   Session 1: Pt received sitting in WC, rating surgical back pain at 2-3/10, pre-medicated and rest provided as needed. Recently placed on IV antibiotics with IV pole on low battery and requiring to be maintained on wall power, therefore session conducted in room. Pt instructed in series of BLE strengthening exercises for carryover in standing LB care and decreased fall-risk, details below: 1x10 LAQ 1x10 Marches 1x10 (10 sec hold) Hip adductions 1x10 Hip abductions (using yellow theraband)  Pt requires multimodal cuing for correct form. Pt remained seated in WC and all immediate needs met.   Session 2: Pt received sitting in WC, NT present, complaints of 4/10 surgical pain, rest provided as needed. Pt dependent for WC transport from room<>day room for time management. In day room, stand-step from WC<>EOM with CGA-Min A + RW. Pt instructed in series of BUE strengthening exercises, details below:  1x10 Modified sit ups 1x10 Seated rows 1x10 Internal/External rotations (1# DB) 1x10 Bicep curls Of note, patient unable to tolerate lateral leans onto mat.   Pt requires multimodal cuing for correct form, utilizing yellow thera-band for majority of exercises, some discomfort present after  modified sit-ups, heat provided at end of session. Pt remained resting in bed with all immediate needs met.   Therapy Documentation Precautions:  Precautions Precautions: Fall, Back Recall of Precautions/Restrictions: Intact (Pt able to recall 3/3 spinal precautions.) Precaution/Restrictions Comments: Verbally reviewed spinal precautions, re-educated on acronym BLT. Required Braces or Orthoses: Spinal Brace Spinal Brace: Thoracolumbosacral orthotic, Applied in sitting position Restrictions Weight Bearing Restrictions Per Provider Order: No   Therapy/Group: Individual Therapy  Artemus Biles, OTR/L, MSOT  01/24/2024, 6:27 AM

## 2024-01-24 NOTE — Progress Notes (Signed)
 Occupational Therapy Session Note  Patient Details  Name: Katelyn [name] MRN: [id number] Date of Birth: [birth year]  Today's Date: 01/25/2024 OT Individual Time: 1020-1100 OT Individual Time Calculation (min): 40 min   Today's Date: 01/25/2024 OT Individual Time: 8694-8654 OT Individual Time Calculation (min): 40 min   Short Term Goals: WEEK 2  Skilled Therapeutic Interventions/Progress Updates:   Session 1: Pt received sitting in WC, minimal complaints of pain, pre-medicated. Pt self propels WC from room>towards main therapy gym with SUP-Min A for management of objects, dependent for remainder of propulsion to ADL apartment for time management. In ADL apartment, pt, patient's spouse, and OT discuss home-shower setup. Pt simulates transfer, stepping-backwards over threshold, bumping back legs into shower, all with Min A + cuing for technique + safe RW proximity. Pt with urgency, performing stand-pivot with Min A + grab bar from WC>toilet, 3/3 toleting with Mod-Max A. Pt ambulates room-level distance within ADL apartment with CGA-Min A + RW. Pt remained sitting in WC, spouse present, all immediate needs met.   Session 2: Pt received sitting in [facility] with urgent need to use bathroom. Pt ambulates into/out of bathroom with CGA-Min A + RW, cuing required for safe RW proximity especially with threshold management. Pt toilets with Max A due to urgency, completing full-body bathing with assistance provided for back and feet. Pt requires re-education on use of long-handeled sponge for adherence to back precautions with BLE bathing. Pt doffs top with supervision, requiring light Min A for donning clean top due to PICC line entanglement. Pt able to unthread BLE from LB garments with supervision + reacher, Max A for donning clean brief/pants due to time constraints. Pt remained sitting in [facility] with all immediate needs met, spouse present.   Therapy Documentation Precautions:  Precautions Precautions: Fall,  Back Recall of Precautions/Restrictions: Intact (Pt able to recall 3/3 spinal precautions.) Precaution/Restrictions Comments: Verbally reviewed spinal precautions, re-educated on acronym BLT. Required Braces or Orthoses: Spinal Brace Spinal Brace: Thoracolumbosacral orthotic, Applied in sitting position Restrictions Weight Bearing Restrictions Per Provider Order: No   Therapy/Group: Individual Therapy  Nereida Habermann, OTR/L, MSOT  01/25/2024, 3:50 PM

## 2024-01-24 NOTE — Plan of Care (Signed)
  Problem: Consults Goal: RH SPINAL CORD INJURY PATIENT EDUCATION Description:  See Patient Education module for education specifics.  Outcome: Progressing Goal: Skin Care Protocol Initiated - if Braden Score 18 or less Description: If consults are not indicated, leave blank or document N/A Outcome: Progressing Goal: Nutrition Consult-if indicated Outcome: Progressing Goal: Diabetes Guidelines if Diabetic/Glucose > 140 Description: If diabetic or lab glucose is > 140 mg/dl - Initiate Diabetes/Hyperglycemia Guidelines & Document Interventions  Outcome: Progressing   Problem: SCI BOWEL ELIMINATION Goal: RH STG MANAGE BOWEL WITH ASSISTANCE Description: STG Manage Bowel with minimal  Assistance. Outcome: Progressing Goal: RH STG SCI MANAGE BOWEL WITH MEDICATION WITH ASSISTANCE Description: STG SCI Manage bowel with medication with minimal assistance. Outcome: Progressing   Problem: SCI BLADDER ELIMINATION Goal: RH STG MANAGE BLADDER WITH ASSISTANCE Description: STG Manage Bladder With minimal Assistance Outcome: Progressing Goal: RH STG MANAGE BLADDER WITH MEDICATION WITH ASSISTANCE Description: STG Manage Bladder With Medication With minimal Assistance. Outcome: Progressing   Problem: RH SKIN INTEGRITY Goal: RH STG SKIN FREE OF INFECTION/BREAKDOWN Outcome: Progressing Goal: RH STG MAINTAIN SKIN INTEGRITY WITH ASSISTANCE Description: STG Maintain Skin Integrity With Assistance. Outcome: Progressing Goal: RH STG ABLE TO PERFORM INCISION/WOUND CARE W/ASSISTANCE Description: STG Able To Perform Incision/Wound Care With Assistance. Outcome: Progressing   Problem: RH SAFETY Goal: RH STG ADHERE TO SAFETY PRECAUTIONS W/ASSISTANCE/DEVICE Description: STG Adhere to Safety Precautions With minimal Assistance/Device. Outcome: Progressing Goal: RH STG DECREASED RISK OF FALL WITH ASSISTANCE Description: STG Decreased Risk of Fall With minimal Assistance. Outcome: Progressing   Problem:  RH PAIN MANAGEMENT Goal: RH STG PAIN MANAGED AT OR BELOW PT'S PAIN GOAL Description: <4 w/ prns Outcome: Progressing   Problem: RH KNOWLEDGE DEFICIT SCI Goal: RH STG INCREASE KNOWLEDGE OF SELF CARE AFTER SCI Outcome: Progressing   Problem: Consults Goal: RH SPINAL CORD INJURY PATIENT EDUCATION Description:  See Patient Education module for education specifics.  Outcome: Progressing   Problem: SCI BOWEL ELIMINATION Goal: RH STG MANAGE BOWEL WITH ASSISTANCE Description: STG Manage Bowel with Assistance. Outcome: Progressing   Problem: SCI BLADDER ELIMINATION Goal: RH STG MANAGE BLADDER WITH ASSISTANCE Description: STG Manage Bladder With Assistance Outcome: Progressing   Problem: RH SKIN INTEGRITY Goal: RH STG SKIN FREE OF INFECTION/BREAKDOWN Outcome: Progressing   Problem: RH SAFETY Goal: RH STG ADHERE TO SAFETY PRECAUTIONS W/ASSISTANCE/DEVICE Description: STG Adhere to Safety Precautions With minimal  Assistance/Device. Outcome: Progressing   Problem: RH KNOWLEDGE DEFICIT SCI Goal: RH STG INCREASE KNOWLEDGE OF SELF CARE AFTER SCI Description: Manage increase knowledge of self care \\after  SCI  SCI with minimal assistance  using educational materials provided Outcome: Progressing

## 2024-01-24 NOTE — Progress Notes (Signed)
 Physical Therapy Session Note  Patient Details  Name: Katelyn [name] MRN: [id number] Date of Birth: June 27, 1956  Today's Date: 01/24/2024 PT Individual Time: 0805-0901 + 1120-1201 PT Individual Time Calculation (min): 56 min+ 41 min  Short Term Goals: Week 2:  PT Short Term Goal 1 (Week 2): Pt will complete bed mobility with mod assist consistently PT Short Term Goal 2 (Week 2): Pt will ambulate with RW 65' with mod assist and 2nd person WC follow PT Short Term Goal 3 (Week 2): Pt will complete stand pivot transfers bed<>WC with mod assist consistently  Skilled Therapeutic Interventions/Progress Updates:    SESSION 1: Pt presents in room in bed, agreeable to PT. Pt denies pain this morning, however reporting difficulty with urge incontinence with nursing staff unable to reach pt in time to assist with toileting. Pt states it takes ~5 minutes for nursing staff to arrive at nighttime to assist to toilet during which time pt has voided bladder. Therapist discussed timed toileting, medication, and possible purewick if this persists. RN supervisor notified. Session focused on gait training for navigating narrow spaces, doorways, and thresholds and therapeutic activities to promote standing tolerance dynamic standing balance, bed mobility training, and transfer training needed to promote independence with functional mobility.  Pt completes bed mobility with mod assist, cues for log roll technique with assist for rolling to side and bringing BLEs off EOB, pt able to manage trunk to upright with elevated HOB. Pt educated throughout session on back precautions and pt demonstrates excessive reaching and twisting, reports having taken SCDs off by herself. Pt verbalizing understanding following education but would benefit from reinforcement.  Pt completes ambulates ~15' into bathroom with RW with min assist and transfers to toilet, requires mod assist for managing pants and brief in standing and comes to sitting  on toilet. Pt demonstrating incontinent brief however continent of bowel and bladder while seated on toilet, charted. Pt requires assist for managing doffing/donning pants and new brief, stands with min assist to RW and requires max assist for periarea hygiene as pt demonstrating twisting to complete posterior periarea hygiene.  Pt then ambulates with RW ~15' back to WC min assist and comes to sitting. Pt positioned with RW in front of her at sink, pt stands to complete oral hygiene and face hygiene at sink, cues for upright posture, pt able to maintain standing 4 minutes.  Pt then completes WC mobility, self propels 180' with BUEs, close supervision with pt occasionally demonstrating increased proximity to obstacles bilaterally requiring cues to correct. Pt requires cues for locking brakes prior to readjusting in seat, pt demonstrating attempting to scoot back in seat with WC brakes unlocked.  Pt returns to room and remains seated in [facility] with all needs within reach, cal light in place at end of session.   SESSION 2: Pt presents in room seated in WC, agreeable to PT. Pt denies pain. Session focused on gait training for tolerance to upright and BLE strengthening for improving upright posture for gait and transfers.  Pt transported to day room dependently for time management. Pt completes sit to stand to RW with CGA. Pt ambulates 2x62' with RW min assist, requires cues for correcting RLE adduction as pt demonstrating increased scissoring with fatigue, cues for upright gaze and posture with pt demonstrating increasing trunk flexion with fatigue.  Pt completes stand therex for BLE strengthening and tolerance to upright including: - standing glute sets x19 - standing marches x10 BLE (tactile cues for terminal hip extension with  single limb stability) - standing heel raise x10  Pt provided with seated rest breaks between all gait trials and exercises to promote energy conservation and quality with  tasks.  Pt returns to room and remains seated in [facility] with all needs within reach, cal light in place at end of session.    Therapy Documentation Precautions:  Precautions Precautions: Fall, Back Recall of Precautions/Restrictions: Intact (Pt able to recall 3/3 spinal precautions.) Precaution/Restrictions Comments: Verbally reviewed spinal precautions, re-educated on acronym BLT. Required Braces or Orthoses: Spinal Brace Spinal Brace: Thoracolumbosacral orthotic, Applied in sitting position Restrictions Weight Bearing Restrictions Per Provider Order: No   Therapy/Group: Individual Therapy  Annia Kilts PT, DPT 01/24/2024, 9:04 AM

## 2024-01-24 NOTE — Progress Notes (Signed)
 PROGRESS NOTE   Subjective/Complaints: Hgb stable. Labs stable. BG remains elevated.  1x incontinent urine overnight.  Continues to have urinary urgency, does not think that current medications are helping, not having any side effects.  LBM 4/17, no bladder scans  Lower extremity edema much improved back on torsemide.  No other concerns, complaints for today.  ROS: as per HPI. Denies CP, SOB, abd pain, N/V/D/C, or any other complaints at this time.    + R shoulder pain--ongoing. + Urge incontinence-chronic, somewhat worsened  Objective:   No results found.   Recent Labs    01/24/24 0400  WBC 3.8*  HGB 8.2*  HCT 25.1*  PLT 157   No results for input(s): "NA", "K", "CL", "CO2", "GLUCOSE", "BUN", "CREATININE", "CALCIUM" in the last 72 hours.     Intake/Output Summary (Last 24 hours) at 01/24/2024 0937 Last data filed at 01/24/2024 0549 Gross per 24 hour  Intake 236 ml  Output 1100 ml  Net -864 ml        Physical Exam: Vital Signs Blood pressure 127/66, pulse 86, temperature 98.7 F (37.1 C), temperature source Oral, resp. rate 18, height 5\' 1"  (1.549 m), weight 88.9 kg, SpO2 96%.  Constitutional: No apparent distress. Appropriate appearance for age. +Obese.  Sitting upright in wheelchair.Aaron Aas    HENT: No JVD. Neck Supple. Trachea midline. Eyes: PERRLA. EOMI. Cardiovascular: RRR, no murmurs/rub/gallops.  tr+ LLE Edema. Peripheral pulses 2+  Respiratory: CTAB. No rales, rhonchi, or wheezing. On RA.  Abdomen: Nondistended.  Nontender to palpation. Skin: PICC line clean, dry, intact.  Small amount of blood around site. Back incision covered   MSK:      No apparent deformity. + RUE shoulder abduction/flexion limited to <70 degrees: Unchanged       +` TLSO Neurologic exam:  Cognition: AAO to person, place, time and event.  Language: Fluent, No substitutions or neoglisms. No dysarthria.  Memory: No apparent  deficits  Insight: Good insight into current condition.  Mood: Pleasant affect, appropriate mood.   Sensation: To light touch reduced in distal fingertips and toes bilaterally; sensation improving at bilateral knees Reflexes: 2+ in BL UE and lower extremities CN: 2-12 grossly intact.  Coordination: No apparent tremors. No ataxia on FTN bilaterally.  Spasticity: MAS 0 in all extremities.       Strength:                RUE: 5/5 SA, 5/5 EF, 5/5 EE, 5/5 WE, 5/5 FF, 5/5 FA                LUE:  5/5 SA, 5/5 EF, 5/5 EE, 5/5 WE, 5/5 FF, 5/5 FA                RLE: 4-/5 HF, 4-/5 KE, 4/5  DF, 4/5  EHL, 4/5  PF                 LLE:  4-/5 HF, 5/5 KE, 5/5  DF, /5  EHL, 5-/5  PF    Assessment/Plan: 1. Functional deficits which require 3+ hours per day of interdisciplinary therapy in a comprehensive inpatient rehab setting. Physiatrist is providing close team supervision and  24 hour management of active medical problems listed below. Physiatrist and rehab team continue to assess barriers to discharge/monitor patient progress toward functional and medical goals  Care Tool:  Bathing    Body parts bathed by patient: Right arm, Chest, Abdomen, Right upper leg, Left upper leg, Face   Body parts bathed by helper: Left arm, Front perineal area, Buttocks, Right lower leg, Left lower leg     Bathing assist Assist Level: Moderate Assistance - Patient 50 - 74%     Upper Body Dressing/Undressing Upper body dressing   What is the patient wearing?: Button up shirt    Upper body assist Assist Level: Moderate Assistance - Patient 50 - 74%    Lower Body Dressing/Undressing Lower body dressing      What is the patient wearing?: Pants     Lower body assist Assist for lower body dressing: Total Assistance - Patient < 25%     Toileting Toileting    Toileting assist Assist for toileting: 2 Helpers     Transfers Chair/bed transfer  Transfers assist     Chair/bed transfer assist level: Minimal  Assistance - Patient > 75%     Locomotion Ambulation   Ambulation assist   Ambulation activity did not occur: Safety/medical concerns (weakness, pain, decreased balance)  Assist level: Minimal Assistance - Patient > 75% Assistive device: Walker-rolling Max distance: 15'   Walk 10 feet activity   Assist  Walk 10 feet activity did not occur: Safety/medical concerns (weakness, pain, decreased balance)  Assist level: Minimal Assistance - Patient > 75% Assistive device: Walker-rolling   Walk 50 feet activity   Assist Walk 50 feet with 2 turns activity did not occur: Safety/medical concerns (weakness, pain, decreased balance)         Walk 150 feet activity   Assist Walk 150 feet activity did not occur: Safety/medical concerns (weakness, pain, decreased balance)         Walk 10 feet on uneven surface  activity   Assist Walk 10 feet on uneven surfaces activity did not occur: Safety/medical concerns (weakness, pain, decreased balance)         Wheelchair     Assist Is the patient using a wheelchair?: Yes Type of Wheelchair: Manual    Wheelchair assist level: Supervision/Verbal cueing Max wheelchair distance: 150'    Wheelchair 50 feet with 2 turns activity    Assist        Assist Level: Supervision/Verbal cueing   Wheelchair 150 feet activity     Assist      Assist Level: Supervision/Verbal cueing   Blood pressure 127/66, pulse 86, temperature 98.7 F (37.1 C), temperature source Oral, resp. rate 18, height 5\' 1"  (1.549 m), weight 88.9 kg, SpO2 96%.  Medical Problem List and Plan: 1. 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  OK to stop continuous penicillin G for showers.             -ELOS/Goals: 9-12 days, SPV PT/OT - 4/25 DC date             -Continue  CIR   - 4/8: Max a bed mobility, STEDY - very fearful of falling. She did some marching at EOB today with the walker. Min A short distances goals. Mod A UB  Max A LBD and toiletting; really limited by pain and ROM in her right arm.     4/15: Confusion has cleared per therapies. Bed mobility Mod A, transfers Mod A with SPT. Walking about 15 feet with Min A. SPV with WC. Husband has been very involved and very helpful. She has some ataxia and endurance difficulty. Setup to Min A with UB care, limited by R shoulder pain. Doing well with reacher.    2.  Antithrombotics: -DVT/anticoagulation:  Mechanical: Sequential compression devices, below knee Bilateral lower extremities  -01/13/24 DVT US neg              -antiplatelet therapy: N/A 3. Acute on chronic pain/Pain Management:  Was on Oxycodone 5 mg and Lyrica 75 mg daily PTA.  --Having surgical pain--continue Oxycodone prn. Pain worse at nights-->will schedule oxycodone at bedtime.  -Voltaren gel QID -4/7: DC morphine due to no utilization - 4/8: Oxy scheduled overnight and when taken in the a.m. causing some lethargy intermittently--patient has tolerated hydrocodone in the past, will switch to Norco 2.5 to 5 mg every 6 hours as needed.  Will also schedule Tylenol 500 mg 3 times daily. - 4/9: LFTS up; DC scheduled tylenol. Add oxy to allergy list d/t confusion. Doing much better with Norco - 4/10: Having some a.m. confusion with p.m. Norco.  Has had issues with tramadol in the past.  Patient is already using rarely, advised to continue this regimen and wean off as tolerated. 4-14: No further confusion reported.  DC naproxen as below.  4. Mood/Behavior/Sleep: LCSW to folow for evaluation and support.              -antipsychotic agents: N/A  -Cymbalta 60mg  nightly, mirapex 1mg  at bedtime, mirtazapine 30mg  nightly  -Patient reports sleeping well  5. Neuropsych/cognition: This patient is capable of making decisions on his own behalf.  4/8: Having  increased confusion, staring episodes without new neurologic deficits.  Adjusting pain medications as above.  Has had issues with hallucinations/confusion with cefepime in the past, would consider antibiotic adjustment if alternative causes ruled out.  4/9: episodes resolved with pain medication changes  6. Skin/Wound Care: Routine pressure relief measures. Monitor incision for healing.   4/7: Neurosurgery removed Hemovac over the weekend, current VAC with no drainage, if no further output in 24 hours will discuss removal with Dr. Delorise Shiner removal in 2 days per Dr. Yetta Barre  Wound VAC removed 4-10- -4-11: Limited visualizations due to TLSO when out of bed, nursing to place picture of wound in chart today.  7. Fluids/Electrolytes/Nutrition: Monitor I/O. Routine labs  -01/12/24 CMP stable today, Cr down to 1.55, AST 44, improving, monitor  4-7: LFTs mildly up trending, BUN and creatinine down, mild hyponatremia.  Limit Tylenol to 3000 mg daily as above.  Repeat in 2 to 3 days.  4/9: LFTS continue up; NA 131. See below  8. Neuropathy/band like pain around waist: Will increase Lyrica to TID 9. Osteomyelitis/discitis. Pending PICC placement. -- Starting 6 weeks of daptomycin and ertapenem for 6 weeks through 02/20/24 to be followed by PO suppression doxycycline and ciprofloxacin.  -01/13/24 Cx NGTD x4d--> 1 of 2 cultures grew cutibacterium acnes--covered with current antibiotics 4-7: ESR added on to a.m. labs.  CRP up today; discussed with ID, no new orders. 4-10: Infectious disease changing ertapenem to Cipro due to LFT elevations as below.  Continue daptomycin; has had issues with vancomycin in the past 4-11: Daptomycin switched to continuous penicillin G yesterday afternoon.  Continuing Cipro.  Per infectious disease/pharmacy looking into insurance coverage for continuous penicillin G cassette versus switching to Rocephin prior to discharge.  If switching to Rocephin, asked to be informed a few days prior  so we could trial it at rehab, given significant side effects to cefepime in the past (more likely related to accumulation with AKI - not true allergy). 4/14: tolerating rocephin since Friday  10. HTN/edema:  Monitor BP TID--on torsemide 40mg  daily and coreg 6.125mg  BID (not reordered?). -4/5-6/25 BPs mostly fine, coreg not ordered as of now; remain off for now 4-7: Systolic low, p.o. intakes minimal.  Reduce torsemide for tomorrow a.m. to 20 mg, and encourage p.o. fluids today. 4-8: Diastolic remains low, will DC torsemide.  Schedule daily weights. 4/9: BP up a bit, continue to hold torsemide  4-10: Increased lower extremity edema, remains with diastolic hypotension but wishes to resume home torsemide; will resume at 20 mg daily.  Continue teds 4-11: Peripheral edema improved, patient tolerating well.  Continue current medications. -4/12-13/25 BPs great, monitor 4/14: Diastolic soft, but stable. Monitor with therapies.  4/15: Increasing peripheral edema; diastolic remains soft but asymptomatic.  Increase torsemide back to 40 mg daily, will give extra 20 mg dose today to catch up.  Monitor weights. 4-16: Weights down significantly.  BP tolerating current dose of torsemide.  Continue current medications.  Vitals:   01/20/24 2030 01/21/24 0452 01/21/24 1515 01/21/24 1945  BP: (!) 118/52 (!) 144/66 (!) 112/57 (!) 104/51   01/22/24 0443 01/22/24 1331 01/22/24 1918 01/23/24 0500  BP: 132/62 100/77 (!) 131/56 (!) 132/53   01/23/24 0546 01/23/24 1434 01/23/24 1935 01/24/24 0546  BP: (!) 132/53 (!) 117/54 115/85 127/66    Filed Weights   01/20/24 0530 01/21/24 0604 01/23/24 0500  Weight: 90.5 kg 91.2 kg 88.9 kg    11. T2DM: Hgb A1C-7.9. Used [name] 50-120/ 5 units ac meal TID at home.              --Continue insulin glargine 20 units daily with SSI.  -4/5-6/25 CBGs a bit better this weekend, monitor trend but may need further adjustments since they're still mostly >200 4/7: Blood sugars remain  elevated, increase glargine to 25 units daily starting tomorrow a.m. 4/11: Blood sugars uptrending with improved p.o.; resume home standing short acting insulin, started 3 units 3 times daily WC -4/14: CBG elevated, seems to have been better overall over the weekend so we will monitor for 24 hours and if remains consistently elevated will adjust insulin 4-15: Blood sugars still tending high, with highs to this a.m., increase glargine to 30 units daily-- monitor increase 4-16 4/17: Increase Semglee to 35 units.  Increase Premeal insulin to 5 units standing. CBG (last 3)  Recent Labs    01/23/24 1638 01/23/24 2139 01/24/24 0624  GLUCAP 190* 200* 158*    12.  Acute on CKD stage III--baseline creatinine 1.4: SCr has had rise to 1.56-->will d/c celebrex.  -01/12/24 Cr 1.55 today, down from 1.86 yesterday, monitor Monday  4-7, 4/9: BUN, creatinine improving. 4-11: BUN stable, creatinine slightly up to 1.26; monitor on Monday, may need to DC naprosyn if up trended -01/19/24 Cr up to 1.34, still better than prior and near her baseline; will decrease naprosyn to 250mg  BID for now, monitor on Monday labs.  4/14: Cr stable   13. Fever blisters: Will start valtrex 1g BID x1wk as symptomatic. Improving, EOT 4/11  14. Right shoulder pain: Question strain but heard a pop this am and extremely concerned that  she may have torn her RTC again --Voltaren gel for local measures.  -- Consider MRI if no improvement 4/8: R shoulder remains very painful/limited - will get x-ray and consider MRI if no acute findings. 4/9: Xray with mild changes; discussed with patient, unable to get MRI due to spinal cord stimulator leads, will order CT 4/10: CT nondiagnostic, some changes "suggestive of rotator cuff tear", mild before meals and glenohumeral degenerative changes.  Consulted orthopedics, appreciate Earney Hamburg PA-C's evaluation, recommending no intervention at this time, added naproxen 500 mg twice daily. 4-14:  Minimal improvements with naproxen, will DC to CKD as above   15. Constipation: Continue Senna 2 tabs daily--miralax was added on 04/04 -01/12/24 LBM yesterday, didn't get the 32oz miralax so d/c'd. Continue miralax 17g daily for now.  -01/13/24 LBM last night, cont regimen as is for now, adjust accordingly 4-7: Increase Senokot-S to 2 tabs twice daily, MiraLAX BID 01/23/24 LBM    16. Depression/Anxiety: Continue Cymbalta 60mg  nightly and Mirtazapine 30mg  nightly.    17. Neurogenic bladder: Continue foley. D/c in am if has results with laxative tonight. -01/12/24 had BM last night, will pull foley, do timed toileting and PVRs q4-6h, ISC if PVR >371ml -01/13/24 urinating well, low PVRs, continue checking but could d/c tomorrow if still urinating fine.  4-7: Single episode of incontinence overnight, with PVR 170s.  Otherwise continent.  Continue current regimen.  18. ABLA: Hgb 10.5 01/12/24, monitor  -4-7: Hemoglobin down from 10.5-9.4; no overt signs of bleeding, repeat in 1 to 2 days  4-9: Hemoglobin stable, 8.8 -01/20/24 Hgb 8.1 on 4/11 but up to 8.3 on 4/12; monitor tomorrow since still fairly stable.   -4/17 stable  19. GERD: reordered Protonix 40mg  daily -4-10: Increase Protonix to 40 mg twice daily for GI prophylaxis with antibiotics and now naprosyn 4-14: Will reduce Protonix back to once daily with DC naproxen  20.  Transaminitis.  Uptrending on 4-7 labs.  -May be stasis related due to poor mobility; Tylenol utilization within parameters  -Repeat in 2 days  4/9: Increasing. DC standing tylenol. Get liver US today, + GGT. Per pharmacy medications unlikely contributors.   4-10: Liver ultrasound without acute findings.  Continues to uptrend; discussed with pharmacy, infectious disease, feel this is close enough to her baseline elevations but no concern but will change ertapenem to Cipro.  4/11:  LFTs have stabilized.  Monitor.  -01/19/24 LFTs fairly stable overall, will change CMP to Monday  instead of daily through the weekend. --ALP continues to uptrend, others approximately stable.  Recheck LFTs in a.m.  21. Hyponatremia.   - ?etiology; possible with abx but unlikely   - Holding torsemide as above   - Add 1200 cc fluid restriciton   - Serum OSM + urine OSM, NA 4/9   - repeat in AM  4-10: Improved today, 132.  Will need to monitor closely with resumption of torsemide.  Add labs in a.m.  01/19/24 stable 133  4-14: Stable 135.  22. Urge incontinence   - 4/16: start ditropan 2.5 mg BID   - Q6H bladder scans  4-17: Increase Ditropan to 5 mg twice daily. LOS: 13 days A FACE TO FACE EVALUATION WAS PERFORMED  [name] 01/24/2024, 9:37 AM

## 2024-01-25 DIAGNOSIS — Z8659 Personal history of other mental and behavioral disorders: Secondary | ICD-10-CM

## 2024-01-25 DIAGNOSIS — G894 Chronic pain syndrome: Secondary | ICD-10-CM

## 2024-01-25 LAB — COMPREHENSIVE METABOLIC PANEL WITH GFR
ALT: 52 U/L — ABNORMAL HIGH (ref 0–44)
AST: 57 U/L — ABNORMAL HIGH (ref 15–41)
Albumin: 2.2 g/dL — ABNORMAL LOW (ref 3.5–5.0)
Alkaline Phosphatase: 300 U/L — ABNORMAL HIGH (ref 38–126)
Anion gap: 9 (ref 5–15)
BUN: 29 mg/dL — ABNORMAL HIGH (ref 8–23)
CO2: 28 mmol/L (ref 22–32)
Calcium: 8.5 mg/dL — ABNORMAL LOW (ref 8.9–10.3)
Chloride: 98 mmol/L (ref 98–111)
Creatinine, Ser: 1.45 mg/dL — ABNORMAL HIGH (ref 0.44–1.00)
GFR, Estimated: 40 mL/min — ABNORMAL LOW (ref >60)
Glucose, Bld: 200 mg/dL — ABNORMAL HIGH (ref 70–99)
Potassium: 3.8 mmol/L (ref 3.5–5.1)
Sodium: 135 mmol/L (ref 135–145)
Total Bilirubin: 0.4 mg/dL (ref 0.0–1.2)
Total Protein: 6 g/dL — ABNORMAL LOW (ref 6.5–8.1)

## 2024-01-25 LAB — GLUCOSE, CAPILLARY
Glucose-Capillary: 184 mg/dL — ABNORMAL HIGH (ref 70–99)
Glucose-Capillary: 249 mg/dL — ABNORMAL HIGH (ref 70–99)
Glucose-Capillary: 224 mg/dL — ABNORMAL HIGH (ref 70–99)
Glucose-Capillary: 243 mg/dL — ABNORMAL HIGH (ref 70–99)

## 2024-01-25 MED ORDER — OXYBUTYNIN CHLORIDE 5 MG PO TABS
5.0000 mg | ORAL_TABLET | Freq: Four times a day (QID) | ORAL | Status: DC
  Administered 2024-01-25 – 2024-01-31 (×25): 5 mg via ORAL
  Filled 2024-01-25 (×25): qty 1

## 2024-01-25 NOTE — Plan of Care (Signed)
  Problem: RH Balance Goal: LTG Patient will maintain dynamic standing balance (PT) Description: LTG:  Patient will maintain dynamic standing balance with assistance during mobility activities (PT) Note: Upgraded due to progress   Problem: Sit to Stand Goal: LTG:  Patient will perform sit to stand with assistance level (PT) Description: LTG:  Patient will perform sit to stand with assistance level (PT) Flowsheets (Taken 01/25/2024 1006) LTG: PT will perform sit to stand in preparation for functional mobility with assistance level: Contact Guard/Touching assist Note: Upgraded due to progress   Problem: RH Bed Mobility Goal: LTG Patient will perform bed mobility with assist (PT) Description: LTG: Patient will perform bed mobility with assistance, with/without cues (PT). Flowsheets (Taken 01/25/2024 1006) LTG: Pt will perform bed mobility with assistance level of: Minimal Assistance - Patient > 75% Note: Downgraded due to decreased carryover of log roll technique   Problem: RH Bed to Chair Transfers Goal: LTG Patient will perform bed/chair transfers w/assist (PT) Description: LTG: Patient will perform bed to chair transfers with assistance (PT). Flowsheets (Taken 01/25/2024 1006) LTG: Pt will perform Bed to Chair Transfers with assistance level: Contact Guard/Touching assist Note: Upgraded due to progress   Problem: RH Ambulation Goal: LTG Patient will ambulate in controlled environment (PT) Description: LTG: Patient will ambulate in a controlled environment, # of feet with assistance (PT). Flowsheets (Taken 01/25/2024 1006) LTG: Pt will ambulate in controlled environ  assist needed:: Contact Guard/Touching assist Note: Upgraded due to progress Goal: LTG Patient will ambulate in home environment (PT) Description: LTG: Patient will ambulate in home environment, # of feet with assistance (PT). Flowsheets (Taken 01/25/2024 1006) LTG: Pt will ambulate in home environ  assist needed:: Contact  Guard/Touching assist Note: Upgraded due to progress   Problem: RH Wheelchair Mobility Goal: LTG Patient will propel w/c in home environment (PT) Description: LTG: Patient will propel wheelchair in home environment, # of feet with assistance (PT). Flowsheets (Taken 01/25/2024 1006) LTG: Pt will propel w/c in home environ  assist needed:: Independent with assistive device Note: Upgraded due to progress

## 2024-01-25 NOTE — Consult Note (Signed)
 Neuropsychological Consultation Comprehensive Inpatient Rehab   Patient:   Katelyn [name]   DOB:   Jul 18, 1956  MR Number:  [id number]  Location:  [facility] [address] [place] [address] Dept: [phone] Loc: [phone]           Date of Service:   01/25/2024  Start Time:   8 AM End Time:   9 AM  Provider/Observer:  Chapman Commodore, Psy.D.       Clinical Neuropsychologist       Billing Code/Service: [phone]  Reason for Service:    Katelyn [name] is a 68 year old female referred for neuropsychological consultation during her ongoing admission to the comprehensive inpatient rehabilitation unit.  Patient has been dealing with chronic pain and significant back issues for some time.  She has a past medical history including CAD, chronic systolic congestive heart failure, hypertension, GERD, chronic kidney disease, esophageal stricture, postoperative infection 2017, osteo/discitis with spinal cord stimulator placement in 2024 that was treated with antibiotics.  Patient also has a history of anxiety disorder.  Patient was admitted on 01/07/2024 with progressive lower extremity weakness with inability to walk as well as burning pain in back.  Patient was found to have urinary retention and Foley was placed.  Myelogram with CT lumbar spine on 3/31 showed no change in T9-T10 discitis/osteo myelitis with no fluid collection in thecal sac.  Patient was taken to the OR for decompressive thoracic surgery T9-T10 and exploration of T10-L2 fusion with removal of loosened T10 screw/hardware with posterior fixation T7-L1 by Dr. Rochelle Chu on 4 11/2023.  Infectious disease consulted with regard to thoracic/lumbar infection and antibiotic therapies were introduced.  After therapy evaluations were completed she was admitted to CIR due to functional decline and need for total assist in ADLs and mobility.  During today's clinical visit, the patient  was awake and alert and in her wheelchair.  Patient's husband was also present.  Patient was oriented x 4 and in appropriate mood state.  Patient denied any acute exacerbation of her longstanding anxiety disorder and reports that she is actually managing and coping with the extended hospital stay quite well and is well versed  in hospital procedures.  The patient has been well-informed regarding the osteomyelitis.  She had appropriate concerns regarding expectations going forward with long-term antibiotic use that we will invariably be needed.  Patient reports that she is getting better as far as her strength and mobility but continues to have pain and weakness in bilateral lower extremities.  We worked on coping and adjustment issues with this with significant spinal issues and need for long-term medical care regarding these issues.  HPI for the current admission:    HPI:  Katelyn [name] is a 68 year old female with history of CAD, chronic diastolic CHF, HTN, GERD, CKD, anxiety d/o, esophageal stricture, multiple back surgeries with post op serratia infection in 2017, osteo/diskitis  w/spinal cord stimulator in place 11/2022 treated with antibiotics but follow up imaging showing progressive discitis/oste s/p  medial facetectomy T11-T12 and posterior fixation T12-L2 and removal of [place] stimulator 02/2023 followed by course of antibiotics with transition to po doxy/Cipro . She was admitted on 01/07/24 with progressive LE weakness with inability to walk as well as burning pain in back. She was found to have urinary retention and foley placed.  Myelogram w/CT lumbar spine 01/07/24 showing no change in T9-T10 discitis/osteo, no focal fluid collection but mass effect on thecal  sac. She was taken to OR for decompressive thoracic lam with foraminotomies T9-T10, exploration of T10-L2 fusion with removal of loosened T10 pedicle screw and hardware with posterior fixation T7-L1 by Dr. Rochelle Chu on 01/09/24.    Dr. Gillian Lacrosse  consulted for input and recommended d/c of cipro /doxy and daptomycin  and ertapenum pending wound cultures. PICC to be placed today for prolonged antibiotics. Therapy has been working with patient who requires max to total assist with ADLs and mobiltiy. She was independent with use of stand up walker prior to a week ago. CIR Recommended due to functional decline.   Medical History:   Past Medical History:  Diagnosis Date   Anxiety    Arthritis    Bursitis of right hip    CAD (coronary artery disease)    Cardiac catheterization June 2014 in [place] - 50% circumflex stenosis   Chest pain, neg MI, stable CAD non obstructive on cath 10/05/20 10/04/2020   Chronic diastolic heart failure (HCC) 08/20/2017   Chronic kidney disease, stage 3 (HCC)    does not see nephrologist   Chronic low back pain without sciatica 03/14/2016   Cirrhosis of liver (HCC)    CKD (chronic kidney disease), stage III (HCC) 10/07/2020   Complication of anesthesia    Cough 04/19/2017   Overview:  Last Assessment & Plan:  Formatting of this note may be different from the original. Cough - ? ACE related with AR triggers   Plan  Patient Instructions  Discuss with your primary doctor that lisinopril  pain, need making your cough worse. May use Mucinex  DM twice daily as needed for cough and congestion Zyrtec 10 mg at bedtime as needed for drainage Saline nasal spray as needed. Lab tests today Activity as tolerated. Follow with Dr. Matilde Son in 3-4 months and As needed   Please contact office for sooner follow up if symptoms do not improve or worsen or seek emergency care    Depression    Dyspnea    with exertion   " lazy lung" - per  Dr Matilde Son from back issues- 06/2016   Elevated liver enzymes 12/05/2016   Essential hypertension    GERD (gastroesophageal reflux disease)    Gout 03/14/2016   Greater trochanteric bursitis of right hip 02/02/2012   H/O hiatal hernia    Heart murmur    History of blood transfusion 2016   History of  esophageal stricture 10/07/2020   History of kidney stones    Hypercholesterolemia    Hypertensive heart disease with heart failure (HCC) 01/01/2017   Hypoxia 10/07/2020   Iliotibial band syndrome of right side 02/02/2012   Iron  deficiency anemia due to chronic blood loss 03/14/2016   LBBB (left bundle branch block) 01/01/2017   Left bundle branch block    Leg weakness 10/07/2020   Lumbar stenosis    Meralgia paraesthetica 12/05/2016   Mild CAD 11/24/2015   Morbid obesity (HCC) 10/07/2020   Neuropathy    OSA (obstructive sleep apnea) 05/24/2016   Overview:  Managed PULM- no CPAP   PONV (postoperative nausea and vomiting)    "no N/V with patch"   Restless leg syndrome    S/P lumbar laminectomy 11/26/2015   S/P lumbar spinal fusion 08/29/2016   Tinnitus 12/05/2016   Type 2 diabetes mellitus (HCC)    UTI (urinary tract infection) 10/07/2020         Patient Active Problem List   Diagnosis Date Noted   Chronic pain syndrome 01/25/2024   Thoracic myelopathy 01/11/2024  Thoracic spondylosis with myelopathy 01/07/2024   ACS (acute coronary syndrome) (HCC) 04/06/2023   Acute sciatica 04/06/2023   Dyslipidemia 04/06/2023   Hypertensive heart and chronic kidney disease 04/06/2023   Hyponatremia 04/06/2023   Hypothermia 04/06/2023   Renal insufficiency 04/06/2023   SIRS (systemic inflammatory response syndrome) (HCC) 04/06/2023   Slurred speech 04/06/2023   Thrombocytopenia (HCC) 04/06/2023   Ventricular tachycardia, nonsustained (HCC) 04/06/2023   PICC (peripherally inserted central catheter) in place 03/28/2023   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   Acute renal failure (ARF) (HCC) 03/13/2023   Elevated LFTs 03/13/2023   AKI (acute kidney injury) (HCC) 02/20/2023   Status post thoracic spinal fusion 02/19/2023   Discitis 02/13/2023   Malfunction of spinal cord stimulator (HCC) 02/13/2023   Serratia 02/13/2023   Diabetes mellitus without complication (HCC) 02/13/2023    Osteomyelitis (HCC) 02/05/2023   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   Infection of spinal cord stimulator (HCC) 12/07/2022   Lactic acidosis 12/06/2022   Hypomagnesemia 12/06/2022   Anemia of chronic disease 12/06/2022   Diskitis 12/05/2022   Status post insertion of spinal cord stimulator 11/22/2022   Acute thoracic back pain 06/22/2022   Other long term (current) drug therapy 06/22/2022   Lumbar spondylosis 06/22/2022   Thoracic spondylosis 06/22/2022   Bilateral sacroiliitis (HCC) 06/22/2022   Chronic, continuous use of opioids 05/31/2022   Pain medication agreement 05/02/2022   Postlaminectomy syndrome of lumbar region 05/02/2022   Radiculopathy of thoracolumbar region 05/02/2022   Class 2 severe obesity due to excess calories with serious comorbidity and body mass index (BMI) of 37.0 to 37.9 in adult [facility]) 05/02/2022   Opioid use agreement exists 05/02/2022   Mastodynia 04/13/2022   Peripheral artery disease (HCC) 07/11/2021   COVID-19 virus infection 06/21/2021   Rib pain on left side 06/06/2021   Abdominal pain 06/05/2021   Diabetes 1.5, 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   GAD (generalized anxiety disorder) 11/25/2020   Anxiety and depression 11/03/2020   DM2 (diabetes mellitus, type 2) (HCC)    Restless leg syndrome    PONV (postoperative nausea and vomiting)    Neuropathy    Lumbar stenosis    Left bundle branch block    Hypercholesterolemia    Heart murmur    History of kidney stones    H/O hiatal hernia    Essential hypertension    Dyspnea    Chronic depression    Complication of anesthesia    CKD stage 3b, GFR 30-44 ml/min (HCC)    CAD (coronary artery disease)    Bursitis of right hip    Arthritis    Anxiety    Morbid obesity (HCC) 10/07/2020   Hypoxia 10/07/2020   History of esophageal stricture 10/07/2020   CKD (chronic kidney disease), stage III (HCC) 10/07/2020   Leg weakness 10/07/2020   UTI (urinary tract  infection) 10/07/2020   Chronic hypoxemic respiratory failure (HCC) 10/07/2020   Pulmonary hypertension (HCC) 10/06/2020   Chest pain, neg MI, stable CAD non obstructive on cath 10/05/20 10/04/2020   Hardening of the aorta (main artery of the heart) (HCC) 11/25/2019   Hypertensive heart disease with congestive heart failure and chronic kidney disease (HCC) 11/25/2019   Shortness of breath 06/11/2019   Fatty liver 04/08/2019   Migraine 04/08/2019   History of chronic kidney disease 04/08/2019   Acute non-recurrent sinusitis 10/12/2017   Chronic diastolic CHF (congestive heart failure) (HCC) 08/20/2017   Genetic testing 08/17/2017   Family history  of ovarian cancer 08/17/2017   Family history of breast cancer 08/17/2017   Cough 04/19/2017   DOE (dyspnea on exertion) 04/19/2017   Type 2 diabetes mellitus with diabetic neuropathy, unspecified (HCC) 01/16/2017   Hypertensive heart disease with heart failure (HCC) 01/01/2017   Hyperlipidemia 01/01/2017   LBBB (left bundle branch block) 01/01/2017   Elevated liver enzymes 12/05/2016   Meralgia paraesthetica 12/05/2016   Tinnitus 12/05/2016   Spinal stenosis of lumbar region with neurogenic claudication 12/05/2016   Wound dehiscence 10/23/2016   S/P lumbar spinal fusion 08/29/2016   OSA (obstructive sleep apnea) 05/24/2016   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   Chronic low back pain without sciatica 03/14/2016   GERD (gastroesophageal reflux disease) 03/14/2016   Gout 03/14/2016   Iron  deficiency anemia due to chronic blood loss 03/14/2016   Type 2 diabetes mellitus without complication, with long-term current use of insulin  (HCC) 03/14/2016   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HCC) 12/07/2015   Restless leg 12/07/2015   Insomnia 12/07/2015   S/P lumbar laminectomy 11/26/2015   Postprocedural state 11/26/2015   Mild CAD 11/24/2015   Lumbar radiculopathy 10/15/2015   History of blood transfusion 2016   Kidney stone 04/20/2014    Stricture of esophagus 04/20/2014   Greater trochanteric bursitis of right hip 02/02/2012   Iliotibial band syndrome of right side 02/02/2012   Iliotibial band syndrome 02/02/2012   Bursitis, trochanteric 02/02/2012    Behavioral Observation/Mental Status:   YAILEN ZEMAITIS  presents as a 68 y.o.-year-old Right handed Caucasian Female who appeared her stated age. her dress was Appropriate and she was Well Groomed and her manners were Appropriate to the situation.  her participation was indicative of Appropriate and Attentive behaviors.  There were physical disabilities noted.  she displayed an appropriate level of cooperation and motivation.    Interactions:    Active Appropriate  Attention:   within normal limits and attention span and concentration were age appropriate  Memory:   within normal limits; recent and remote memory intact  Visuo-spatial:   not examined  Speech (Volume):  normal  Speech:   normal; normal  Thought Process:  Coherent and Relevant  Coherent, Linear, and Logical  Though Content:  WNL; not suicidal and not homicidal  Orientation:   person, place, time/date, and situation  Judgment:   Good  Planning:   Fair  Affect:    Appropriate  Mood:    Anxious  Insight:   Good  Intelligence:   normal  Psychiatric History:  Patient with past history of anxiety and continue with her home medicines.  Family Med/Psych History:  Family History  Problem Relation Age of Onset   Lung cancer Father        smoked   Hypertension Father    Stroke Father    Heart failure Mother    Bone cancer Sister    Asthma Sister    Impression/DX:   DAVINITY FANARA is a 68 year old female referred for neuropsychological consultation during her ongoing admission to the comprehensive inpatient rehabilitation unit.  Patient has been dealing with chronic pain and significant back issues for some time.  She has a past medical history including CAD, chronic systolic congestive heart  failure, hypertension, GERD, chronic kidney disease, esophageal stricture, postoperative infection 2017, osteo/discitis with spinal cord stimulator placement in 2024 that was treated with antibiotics.  Patient also has a history of anxiety disorder.  Patient was admitted on 01/07/2024 with progressive lower extremity weakness with inability to walk as  well as burning pain in back.  Patient was found to have urinary retention and Foley was placed.  Myelogram with CT lumbar spine on 3/31 showed no change in T9-T10 discitis/osteo myelitis with no fluid collection in thecal sac.  Patient was taken to the OR for decompressive thoracic surgery T9-T10 and exploration of T10-L2 fusion with removal of loosened T10 screw/hardware with posterior fixation T7-L1 by Dr. Rochelle Chu on 4 11/2023.  Infectious disease consulted with regard to thoracic/lumbar infection and antibiotic therapies were introduced.  After therapy evaluations were completed she was admitted to CIR due to functional decline and need for total assist in ADLs and mobility.  During today's clinical visit, the patient was awake and alert and in her wheelchair.  Patient's husband was also present.  Patient was oriented x 4 and in appropriate mood state.  Patient denied any acute exacerbation of her longstanding anxiety disorder and reports that she is actually managing and coping with the extended hospital stay quite well and is well versed  in hospital procedures.  The patient has been well-informed regarding the osteomyelitis.  She had appropriate concerns regarding expectations going forward with long-term antibiotic use that we will invariably be needed.  Patient reports that she is getting better as far as her strength and mobility but continues to have pain and weakness in bilateral lower extremities.  We worked on coping and adjustment issues with this with significant spinal issues and need for long-term medical care regarding these issues.   Diagnosis:     History of anxiety disorder with chronic pain issues         Electronically Signed   _______________________ Chapman Commodore, Psy.D. Clinical Neuropsychologist

## 2024-01-25 NOTE — Progress Notes (Signed)
 Patient ID: Katelyn [name], female   DOB: [birth year], 68 y.o.   MRN: [id number]  SW sent demo sheet to [employer].  Norval Been, MSW, LCSW Office: [phone] Cell: [phone] Fax: [phone]

## 2024-01-25 NOTE — Progress Notes (Signed)
 Occupational Therapy Weekly Progress Note  Patient Details  Name: Katelyn [name] MRN: [id number] Date of Birth: [birth year]  Beginning of progress report period: {Time; dates multiple:304500300} End of progress report period: {Time; dates multiple:304500300}  {CHL IP REHAB OT TIME CALCULATIONS:304400400}   Patient has met {number 1-5:22450} of {number 1-5:20334} short term goals.  ***  Patient continues to demonstrate the following deficits: {impairments:3041632} and therefore will continue to benefit from skilled OT intervention to enhance overall performance with {ADL/iADL:3041649}.  Patient {LTG progression:3041653}.  {plan of WUJW:1191478}  OT Short Term Goals {OT GNF:6213086}  Skilled Therapeutic Interventions/Progress Updates:      Therapy Documentation Precautions:  Precautions Precautions: Fall, Back Recall of Precautions/Restrictions: Intact (Pt able to recall 3/3 spinal precautions.) Precaution/Restrictions Comments: Verbally reviewed spinal precautions, re-educated on acronym BLT. Required Braces or Orthoses: Spinal Brace Spinal Brace: Thoracolumbosacral orthotic, Applied in sitting position Restrictions Weight Bearing Restrictions Per Provider Order: No   Therapy/Group: Individual Therapy  Artemus Biles, OTR/L, MSOT  01/25/2024, 6:25 PM

## 2024-01-25 NOTE — Progress Notes (Signed)
 Physical Therapy Weekly Progress Note  Patient Details  Name: Katelyn [name] MRN: [id number] Date of Birth: [birth year]  Beginning of progress report period: January 18, 2024 End of progress report period: January 25, 2024  Today's Date: 01/25/2024 PT Individual Time: 0910-1004 PT Individual Time Calculation (min): 54 min   Patient has met 3 of 3 short term goals. Pt making good progress towards functional goals. Pt currently requires mod assist for bed mobility, min assist for transfers with RW, ambulating up to 60' with RW min assist, and up/down 1 4" step in //bars with min assist, WC mobility with supervision 150'. Education is ongoing with pt husband, will initiate transfer training next week. Pt will benefit from custom lightweight manual WC to allow for increased independence at DC, [facility] evaluation scheduled for 4/22.  Patient continues to demonstrate the following deficits muscle weakness, decreased cardiorespiratoy endurance, impaired timing and sequencing, abnormal tone, unbalanced muscle activation, and decreased coordination, and decreased standing balance, decreased postural control, and decreased balance strategies and therefore will continue to benefit from skilled PT intervention to increase functional independence with mobility.  Patient  goals adjusted due to pt progress .  Plan of care revisions: downgraded bed mobility and upgraded standing balance, transfer, ambulation, and WC mobility goals.  PT Short Term Goals Week 2:  PT Short Term Goal 1 (Week 2): Pt will complete bed mobility with mod assist consistently PT Short Term Goal 1 - Progress (Week 2): Met PT Short Term Goal 2 (Week 2): Pt will ambulate with RW 10' with mod assist and 2nd person WC follow PT Short Term Goal 2 - Progress (Week 2): Met PT Short Term Goal 3 (Week 2): Pt will complete stand pivot transfers bed<>WC with mod assist consistently PT Short Term Goal 3 - Progress (Week 2): Met Week 3:  PT Short Term Goal 1  (Week 3): STG = LTG due to ELOS  Skilled Therapeutic Interventions/Progress Updates:  Pt presents in room seated in WC, hand off from neuropsychologist. Pt denies pain at this time. Session focused on therapeutic exercise for BLE strengthening, gait training, and WC mobility.  Pt self propels WC with BLEs for BLE strengthening and muscular endurance, requires min assist to initiate and maintain momentum due to decreased posterior chain activation.  Pt positioned in //bars, completes step taps 2x10 BLE for BLE strengthening, mod cues for upright posture, trendelenberg stance on RLE. Pt completes 4x3 step ups BLE 4" step with BUE support on //bars, min assist for postural stability and mod cues for foot placement with pt demonstrating ataxia.  Pt completes gait 67' with RW min assist, pt demonstrating excessive step length, trunk flexion, and scissoring with gait, cues to correct.  Pt self propels WC 150' with supervision back to room, cues to correct for increased proximity to doorway and obstacles on R side. Pt returns to room and remains seated in [facility] with all needs within reach, cal light in place and husband at bedside at end of session.   Ambulation/gait training;Discharge planning;Psychosocial support;Functional mobility training;Therapeutic Activities;Balance/vestibular training;Disease management/prevention;Neuromuscular re-education;Skin care/wound management;Therapeutic Exercise;Wheelchair propulsion/positioning;Cognitive remediation/compensation;DME/adaptive equipment instruction;Pain management;Splinting/orthotics;UE/LE Strength taining/ROM;Community reintegration;Functional electrical stimulation;Patient/family education;Stair training;UE/LE Coordination activities   Therapy Documentation Precautions:  Precautions Precautions: Fall, Back Recall of Precautions/Restrictions: Intact (Pt able to recall 3/3 spinal precautions.) Precaution/Restrictions Comments: Verbally reviewed spinal  precautions, re-educated on acronym BLT. Required Braces or Orthoses: Spinal Brace Spinal Brace: Thoracolumbosacral orthotic, Applied in sitting position Restrictions Weight Bearing Restrictions Per Provider Order: No   Therapy/Group:  Individual Therapy  Annia Kilts PT, DPT 01/25/2024, 12:53 PM

## 2024-01-25 NOTE — Progress Notes (Signed)
 PROGRESS NOTE   Subjective/Complaints: LFTs coming back down, Cr 1.4 today.  Vitals stable  Patient seen in therapy gym, feeling good overall.  Is continuing to have frequent urinary incontinence since resumption of home dose of torsemide ; discussed that, as diuretic removes excess fluid she may be peeing more often, but will increase Ditropan  to 4 times daily today.  PVRs have been 0.  No other concerns, complaints.  ROS: as per HPI. Denies CP, SOB, abd pain, N/V/D/C, or any other complaints at this time.    + R shoulder pain--ongoing. + Urge incontinence-chronic, worsened postop  Objective:   No results found.   Recent Labs    01/24/24 0400  WBC 3.8*  HGB 8.2*  HCT 25.1*  PLT 157   Recent Labs    01/25/24 0400  NA 135  K 3.8  CL 98  CO2 28  GLUCOSE 200*  BUN 29*  CREATININE 1.45*  CALCIUM  8.5*       Intake/Output Summary (Last 24 hours) at 01/25/2024 0839 Last data filed at 01/25/2024 0730 Gross per 24 hour  Intake 940 ml  Output --  Net 940 ml        Physical Exam: Vital Signs Blood pressure (!) 136/59, pulse 95, temperature 98 F (36.7 C), temperature source Oral, resp. rate 16, height 5\' 1"  (1.549 m), weight 94 kg, SpO2 95%.  Constitutional: No apparent distress. Appropriate appearance for age. +Obese.  Sitting upright in wheelchair.Katelyn [name]    HENT: No JVD. Neck Supple. Trachea midline. Eyes: PERRLA. EOMI. Cardiovascular: RRR, no murmurs/rub/gallops.  tr+ LLE Edema. Peripheral pulses 2+  Respiratory: CTAB. No rales, rhonchi, or wheezing. On RA.  Abdomen: Nondistended.  Nontender to palpation. Skin: PICC line clean, dry, intact.  Small amount of blood around site.  Stable appearance. Back incision covered by TLSO.   MSK:      No apparent deformity. + RUE shoulder abduction/flexion limited to <70 degrees: Unchanged       +` TLSO Neurologic exam:  Cognition: AAO to person, place, time and  event.  Language: Fluent, No substitutions or neoglisms. No dysarthria.  Memory: No apparent deficits  Insight: Good insight into current condition.  Mood: Pleasant affect, appropriate mood.   Sensation: To light touch reduced in distal fingertips and toes bilaterally; sensation improving at bilateral knees Reflexes: 2+ in BL UE and lower extremities CN: 2-12 grossly intact.  Coordination: No apparent tremors. No ataxia on FTN bilaterally.  Spasticity: MAS 0 in all extremities.       Strength:                RUE: 5/5 SA, 5/5 EF, 5/5 EE, 5/5 WE, 5/5 FF, 5/5 FA                LUE:  5/5 SA, 5/5 EF, 5/5 EE, 5/5 WE, 5/5 FF, 5/5 FA                RLE: 4-/5 HF, 4-/5 KE, 4/5  DF, 4/5  EHL, 4/5  PF                 LLE:  4-/5 HF, 5/5 KE, 5/5  DF, /5  EHL, 5-/5  PF   No changes on exam 4-18  Assessment/Plan: 1. Functional deficits which require 3+ hours per day of interdisciplinary therapy in a comprehensive inpatient rehab setting. Physiatrist is providing close team supervision and 24 hour management of active medical problems listed below. Physiatrist and rehab team continue to assess barriers to discharge/monitor patient progress toward functional and medical goals  Care Tool:  Bathing    Body parts bathed by patient: Right arm, Chest, Abdomen, Right upper leg, Left upper leg, Face   Body parts bathed by helper: Left arm, Front perineal area, Buttocks, Right lower leg, Left lower leg     Bathing assist Assist Level: Moderate Assistance - Patient 50 - 74%     Upper Body Dressing/Undressing Upper body dressing   What is the patient wearing?: Button up shirt    Upper body assist Assist Level: Moderate Assistance - Patient 50 - 74%    Lower Body Dressing/Undressing Lower body dressing      What is the patient wearing?: Pants     Lower body assist Assist for lower body dressing: Total Assistance - Patient < 25%     Toileting Toileting    Toileting assist Assist for  toileting: 2 Helpers     Transfers Chair/bed transfer  Transfers assist     Chair/bed transfer assist level: Minimal Assistance - Patient > 75%     Locomotion Ambulation   Ambulation assist   Ambulation activity did not occur: Safety/medical concerns (weakness, pain, decreased balance)  Assist level: Minimal Assistance - Patient > 75% Assistive device: Walker-rolling Max distance: 15'   Walk 10 feet activity   Assist  Walk 10 feet activity did not occur: Safety/medical concerns (weakness, pain, decreased balance)  Assist level: Minimal Assistance - Patient > 75% Assistive device: Walker-rolling   Walk 50 feet activity   Assist Walk 50 feet with 2 turns activity did not occur: Safety/medical concerns (weakness, pain, decreased balance)         Walk 150 feet activity   Assist Walk 150 feet activity did not occur: Safety/medical concerns (weakness, pain, decreased balance)         Walk 10 feet on uneven surface  activity   Assist Walk 10 feet on uneven surfaces activity did not occur: Safety/medical concerns (weakness, pain, decreased balance)         Wheelchair     Assist Is the patient using a wheelchair?: Yes Type of Wheelchair: Manual    Wheelchair assist level: Supervision/Verbal cueing Max wheelchair distance: 150'    Wheelchair 50 feet with 2 turns activity    Assist        Assist Level: Supervision/Verbal cueing   Wheelchair 150 feet activity     Assist      Assist Level: Supervision/Verbal cueing   Blood pressure (!) 136/59, pulse 95, temperature 98 F (36.7 C), temperature source Oral, resp. rate 16, height 5\' 1"  (1.549 m), weight 94 kg, SpO2 95%.  Medical Problem List and Plan: 1. 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  OK to  stop continuous penicillin  G for showers.             -ELOS/Goals: 9-12 days, SPV PT/OT - 4/25 DC date             -  Continue CIR   - 4/8: Max a bed mobility, STEDY - very fearful of falling. She did some marching at EOB today with the walker. Min A short distances goals. Mod A UB Max A LBD and toiletting; really limited by pain and ROM in her right arm.     4/15: Confusion has cleared per therapies. Bed mobility Mod A, transfers Mod A with SPT. Walking about 15 feet with Min A. SPV with WC. Husband has been very involved and very helpful. She has some ataxia and endurance difficulty. Setup to Min A with UB care, limited by R shoulder pain. Doing well with reacher.    2.  Antithrombotics: -DVT/anticoagulation:  Mechanical: Sequential compression devices, below knee Bilateral lower extremities  -01/13/24 DVT US  neg              -antiplatelet therapy: N/A 3. Acute on chronic pain/Pain Management:  Was on Oxycodone  5 mg and Lyrica  75 mg daily PTA.  --Having surgical pain--continue Oxycodone  prn. Pain worse at nights-->will schedule oxycodone  at bedtime.  -Voltaren  gel QID -4/7: DC morphine  due to no utilization - 4/8: Oxy scheduled overnight and when taken in the a.m. causing some lethargy intermittently--patient has tolerated hydrocodone  in the past, will switch to Norco 2.5 to 5 mg every 6 hours as needed.  Will also schedule Tylenol  500 mg 3 times daily. - 4/9: LFTS up; DC scheduled tylenol . Add oxy to allergy list d/t confusion. Doing much better with Norco - 4/10: Having some a.m. confusion with p.m. Norco.  Has had issues with tramadol in the past.  Patient is already using rarely, advised to continue this regimen and wean off as tolerated. 4-14: No further confusion reported.  DC naproxen  as below.  4. Mood/Behavior/Sleep: LCSW to folow for evaluation and support.              -antipsychotic agents: N/A  -Cymbalta  60mg  nightly, mirapex  1mg  at bedtime, mirtazapine  30mg  nightly  -Patient  reports sleeping well  5. Neuropsych/cognition: This patient is capable of making decisions on his own behalf.  4/8: Having increased confusion, staring episodes without new neurologic deficits.  Adjusting pain medications as above.  Has had issues with hallucinations/confusion with cefepime  in the past, would consider antibiotic adjustment if alternative causes ruled out.  4/9: episodes resolved with pain medication changes  6. Skin/Wound Care: Routine pressure relief measures. Monitor incision for healing.   4/7: Neurosurgery removed Hemovac over the weekend, current VAC with no drainage, if no further output in 24 hours will discuss removal with Dr. Philip Bravo removal in 2 days per Dr. Rochelle Chu  Wound VAC removed 4-10- -4-11: Limited visualizations due to TLSO when out of bed, nursing to place picture of wound in chart today.  7. Fluids/Electrolytes/Nutrition: Monitor I/O. Routine labs  -01/12/24 CMP stable today, Cr down to 1.55, AST 44, improving, monitor  4-7: LFTs mildly up trending, BUN and creatinine down, mild hyponatremia.  Limit Tylenol  to 3000 mg daily as above.  Repeat in 2 to 3 days.  4/9: LFTS continue up; NA 131. See below  8. Neuropathy/band like pain around waist: Will increase Lyrica  to TID 9. Osteomyelitis/discitis. Pending PICC placement. -- Starting 6 weeks of daptomycin  and ertapenem  for 6 weeks through 02/20/24 to be followed by PO suppression doxycycline  and ciprofloxacin .  -01/13/24 Cx NGTD x4d--> 1 of 2 cultures grew cutibacterium acnes--covered with current antibiotics 4-7: ESR added on to a.m. labs.  CRP up today; discussed with ID, no new orders. 4-10: Infectious disease  changing ertapenem  to Cipro  due to LFT elevations as below.  Continue daptomycin ; has had issues with vancomycin  in the past 4-11: Daptomycin  switched to continuous penicillin  G yesterday afternoon.  Continuing Cipro .  Per infectious disease/pharmacy looking into insurance coverage for continuous  penicillin  G cassette versus switching to Rocephin  prior to discharge.  If switching to Rocephin , asked to be informed a few days prior so we could trial it at rehab, given significant side effects to cefepime  in the past (more likely related to accumulation with AKI - not true allergy). 4/14: tolerating rocephin  since Friday  10. HTN/edema:  Monitor BP TID--on torsemide  40mg  daily and coreg  6.125mg  BID (not reordered?). -4/5-6/25 BPs mostly fine, coreg  not ordered as of now; remain off for now 4-7: Systolic low, p.o. intakes minimal.  Reduce torsemide  for tomorrow a.m. to 20 mg, and encourage p.o. fluids today. 4-8: Diastolic remains low, will DC torsemide .  Schedule daily weights. 4/9: BP up a bit, continue to hold torsemide   4-10: Increased lower extremity edema, remains with diastolic hypotension but wishes to resume home torsemide ; will resume at 20 mg daily.  Continue teds 4-11: Peripheral edema improved, patient tolerating well.  Continue current medications. -4/12-13/25 BPs great, monitor 4/14: Diastolic soft, but stable. Monitor with therapies.  4/15: Increasing peripheral edema; diastolic remains soft but asymptomatic.  Increase torsemide  back to 40 mg daily, will give extra 20 mg dose today to catch up.  Monitor weights. 4-16: Weights down significantly.  BP tolerating current dose of torsemide .  Continue current medications. 4-18: Weights today likely inaccurate.  Peripheral edema stable.  No shortness of breath.  Vital stable.  Monitor.  Vitals:   01/21/24 1945 01/22/24 0443 01/22/24 1331 01/22/24 1918  BP: (!) 104/51 132/62 100/77 (!) 131/56   01/23/24 0500 01/23/24 0546 01/23/24 1434 01/23/24 1935  BP: (!) 132/53 (!) 132/53 (!) 117/54 115/85   01/24/24 0546 01/24/24 1454 01/24/24 1943 01/25/24 0501  BP: 127/66 (!) 122/57 (!) 131/56 (!) 136/59    Filed Weights   01/21/24 0604 01/23/24 0500 01/25/24 0504  Weight: 91.2 kg 88.9 kg 94 kg    11. T2DM: Hgb A1C-7.9. Used Tresiba   50-120/ 5 units ac meal TID at home.              --Continue insulin  glargine 20 units daily with SSI.  -4/5-6/25 CBGs a bit better this weekend, monitor trend but may need further adjustments since they're still mostly >200 4/7: Blood sugars remain elevated, increase glargine to 25 units daily starting tomorrow a.m. 4/11: Blood sugars uptrending with improved p.o.; resume home standing short acting insulin , started 3 units 3 times daily WC -4/14: CBG elevated, seems to have been better overall over the weekend so we will monitor for 24 hours and if remains consistently elevated will adjust insulin  4-15: Blood sugars still tending high, with highs to this a.m., increase glargine to 30 units daily-- monitor increase 4-16 4/17: Increase Semglee  to 35 units.  Increase Premeal insulin  to 5 units standing--May need Semglee  further increased over the weekend  CBG (last 3)  Recent Labs    01/24/24 2109 01/25/24 0501 01/25/24 1156  GLUCAP 232* 184* 249*    12.  Acute on CKD stage III--baseline creatinine 1.4: SCr has had rise to 1.56-->will d/c celebrex .  -01/12/24 Cr 1.55 today, down from 1.86 yesterday, monitor Monday  4-7, 4/9: BUN, creatinine improving. 4-11: BUN stable, creatinine slightly up to 1.26; monitor on Monday, may need to DC naprosyn  if up trended -01/19/24  Cr up to 1.34, still better than prior and near her baseline; will decrease naprosyn  to 250mg  BID for now, monitor on Monday labs.  4/14, 4/18: Cr stable 1.3-1.4 since resumption of home torsemide .  13. Fever blisters: Will start valtrex  1g BID x1wk as symptomatic. Improving, EOT 4/11  14. Right shoulder pain: Question strain but heard a pop this am and extremely concerned that she may have torn her RTC again --Voltaren  gel for local measures.  -- Consider MRI if no improvement 4/8: R shoulder remains very painful/limited - will get x-ray and consider MRI if no acute findings. 4/9: Xray with mild changes; discussed with patient,  unable to get MRI due to spinal cord stimulator leads, will order CT 4/10: CT nondiagnostic, some changes "suggestive of rotator cuff tear", mild before meals and glenohumeral degenerative changes.  Consulted orthopedics, appreciate Steffanie Edouard PA-C's evaluation, recommending no intervention at this time, added naproxen  500 mg twice daily. 4-14: Minimal improvements with naproxen , will DC to CKD as above   15. Constipation: Continue Senna 2 tabs daily--miralax  was added on 04/04 -01/12/24 LBM yesterday, didn't get the 32oz miralax  so d/c'd. Continue miralax  17g daily for now.  -01/13/24 LBM last night, cont regimen as is for now, adjust accordingly 4-7: Increase Senokot-S to 2 tabs twice daily, MiraLAX  BID 01/24/24 LBM    16. Depression/Anxiety: Continue Cymbalta  60mg  nightly and Mirtazapine  30mg  nightly.    17. Neurogenic bladder/urge incontinence: Continue foley. D/c in am if has results with laxative tonight. -01/12/24 had BM last night, will pull foley, do timed toileting and PVRs q4-6h, ISC if PVR >381ml -01/13/24 urinating well, low PVRs, continue checking but could d/c tomorrow if still urinating fine.  4-7: Single episode of incontinence overnight, with PVR 170s.  Otherwise continent.  Continue current regimen. - 4/16: start ditropan  2.5 mg BID. Q6H bladder scans 4-17: Increase Ditropan  to 5 mg twice daily. 4/18: Bladder scan 0, increase Ditropan  to 5 mg 4 times daily.  No fevers, dysuria or leukocytosis to indicate UTI.  18. ABLA: Hgb 10.5 01/12/24, monitor  -4-7: Hemoglobin down from 10.5-9.4; no overt signs of bleeding, repeat in 1 to 2 days  4-9: Hemoglobin stable, 8.8 -01/20/24 Hgb 8.1 on 4/11 but up to 8.3 on 4/12; monitor tomorrow since still fairly stable.   -4/17 stable  19. GERD: reordered Protonix  40mg  daily -4-10: Increase Protonix  to 40 mg twice daily for GI prophylaxis with antibiotics and now naprosyn  4-14: Will reduce Protonix  back to once daily with DC naproxen   20.   Transaminitis.  Uptrending on 4-7 labs.  -May be stasis related due to poor mobility; Tylenol  utilization within parameters  -Repeat in 2 days  4/9: Increasing. DC standing tylenol . Get liver US  today, + GGT. Per pharmacy medications unlikely contributors.   4-10: Liver ultrasound without acute findings.  Continues to uptrend; discussed with pharmacy, infectious disease, feel this is close enough to her baseline elevations but no concern but will change ertapenem  to Cipro .  4/11:  LFTs have stabilized.  Monitor.  -01/19/24 LFTs fairly stable overall, will change CMP to Monday instead of daily through the weekend. --ALP continues to uptrend, others approximately stable.  - 4/18: LFTs stable/downtrending.  21. Hyponatremia.   - ?etiology; possible with abx but unlikely   - Holding torsemide  as above   - Add 1200 cc fluid restriciton   - Serum OSM + urine OSM, NA 4/9   - repeat in AM  4-10: Improved today, 132.  Will need to  monitor closely with resumption of torsemide .  Add labs in a.m.  01/19/24 stable 133  4-14, 4/18: stable LOS: 14 days A FACE TO FACE EVALUATION WAS PERFORMED  Bea Lime 01/25/2024, 8:39 AM

## 2024-01-25 NOTE — Group Note (Signed)
 Patient Details Name: Katelyn [name] MRN: [id number] DOB: 1956/02/09 Today's Date: 01/25/2024  Time Calculation: OT Group Time Calculation OT Group Start Time: 1430 OT Group Stop Time: 1530 OT Group Time Calculation (min): 60 min      Group Description: Dance Group: Pt participated in dance group with an emphasis on social interaction, motor planning, increasing overall activity tolerance and bimanual tasks. All songs were selected by group members. Dance moves included AROM of BUE/BLE gross motor movements with an emphasis on building functional endurance.    Individual level documentation: Patient completed group from sitting level. Patientt needed supervision to complete various dance moves with OT providing visual model.  Patient needed min modifications during group.  Pain: 0/10   Precautions:  Spinal with TLSO  Geoffery Kiel 01/25/2024, 3:56 PM

## 2024-01-26 LAB — GLUCOSE, CAPILLARY
Glucose-Capillary: 210 mg/dL — ABNORMAL HIGH (ref 70–99)
Glucose-Capillary: 209 mg/dL — ABNORMAL HIGH (ref 70–99)
Glucose-Capillary: 210 mg/dL — ABNORMAL HIGH (ref 70–99)
Glucose-Capillary: 241 mg/dL — ABNORMAL HIGH (ref 70–99)

## 2024-01-26 MED ORDER — INSULIN GLARGINE-YFGN 100 UNIT/ML ~~LOC~~ SOLN
38.0000 [IU] | Freq: Every day | SUBCUTANEOUS | Status: DC
  Administered 2024-01-27 – 2024-01-31 (×5): 38 [IU] via SUBCUTANEOUS
  Filled 2024-01-26 (×6): qty 0.38

## 2024-01-26 NOTE — Plan of Care (Signed)
 Upgraded 4/19 due to patient functioning.   Problem: Sit to Stand Goal: LTG:  Patient will perform sit to stand in prep for activites of daily living with assistance level (OT) Description: LTG:  Patient will perform sit to stand in prep for activites of daily living with assistance level (OT) Flowsheets (Taken 01/26/2024 0915) LTG: PT will perform sit to stand in prep for activites of daily living with assistance level: Contact Guard/Touching assist   Problem: RH Toilet Transfers Goal: LTG Patient will perform toilet transfers w/assist (OT) Description: LTG: Patient will perform toilet transfers with assist, with/without cues using equipment (OT) Flowsheets (Taken 01/26/2024 0915) LTG: Pt will perform toilet transfers with assistance level of: Contact Guard/Touching assist

## 2024-01-26 NOTE — Plan of Care (Signed)
  Problem: Consults Goal: RH SPINAL CORD INJURY PATIENT EDUCATION Description:  See Patient Education module for education specifics.  Outcome: Progressing Goal: Skin Care Protocol Initiated - if Braden Score 18 or less Description: If consults are not indicated, leave blank or document N/A Outcome: Progressing Goal: Nutrition Consult-if indicated Outcome: Progressing Goal: Diabetes Guidelines if Diabetic/Glucose > 140 Description: If diabetic or lab glucose is > 140 mg/dl - Initiate Diabetes/Hyperglycemia Guidelines & Document Interventions  Outcome: Progressing   Problem: SCI BOWEL ELIMINATION Goal: RH STG MANAGE BOWEL WITH ASSISTANCE Description: STG Manage Bowel with minimal  Assistance. Outcome: Progressing Goal: RH STG SCI MANAGE BOWEL WITH MEDICATION WITH ASSISTANCE Description: STG SCI Manage bowel with medication with minimal assistance. Outcome: Progressing   Problem: SCI BLADDER ELIMINATION Goal: RH STG MANAGE BLADDER WITH ASSISTANCE Description: STG Manage Bladder With minimal Assistance Outcome: Progressing Goal: RH STG MANAGE BLADDER WITH MEDICATION WITH ASSISTANCE Description: STG Manage Bladder With Medication With minimal Assistance. Outcome: Progressing   Problem: RH SKIN INTEGRITY Goal: RH STG SKIN FREE OF INFECTION/BREAKDOWN Outcome: Progressing Goal: RH STG MAINTAIN SKIN INTEGRITY WITH ASSISTANCE Description: STG Maintain Skin Integrity With Assistance. Outcome: Progressing Goal: RH STG ABLE TO PERFORM INCISION/WOUND CARE W/ASSISTANCE Description: STG Able To Perform Incision/Wound Care With Assistance. Outcome: Progressing   Problem: RH SAFETY Goal: RH STG ADHERE TO SAFETY PRECAUTIONS W/ASSISTANCE/DEVICE Description: STG Adhere to Safety Precautions With minimal Assistance/Device. Outcome: Progressing Goal: RH STG DECREASED RISK OF FALL WITH ASSISTANCE Description: STG Decreased Risk of Fall With minimal Assistance. Outcome: Progressing   Problem:  RH PAIN MANAGEMENT Goal: RH STG PAIN MANAGED AT OR BELOW PT'S PAIN GOAL Description: <4 w/ prns Outcome: Progressing   Problem: RH KNOWLEDGE DEFICIT SCI Goal: RH STG INCREASE KNOWLEDGE OF SELF CARE AFTER SCI Outcome: Progressing   Problem: Consults Goal: RH SPINAL CORD INJURY PATIENT EDUCATION Description:  See Patient Education module for education specifics.  Outcome: Progressing   Problem: SCI BOWEL ELIMINATION Goal: RH STG MANAGE BOWEL WITH ASSISTANCE Description: STG Manage Bowel with Assistance. Outcome: Progressing   Problem: SCI BLADDER ELIMINATION Goal: RH STG MANAGE BLADDER WITH ASSISTANCE Description: STG Manage Bladder With Assistance Outcome: Progressing   Problem: RH SKIN INTEGRITY Goal: RH STG SKIN FREE OF INFECTION/BREAKDOWN Outcome: Progressing   Problem: RH SAFETY Goal: RH STG ADHERE TO SAFETY PRECAUTIONS W/ASSISTANCE/DEVICE Description: STG Adhere to Safety Precautions With minimal  Assistance/Device. Outcome: Progressing   Problem: RH PAIN MANAGEMENT Goal: RH STG PAIN MANAGED AT OR BELOW PT'S PAIN GOAL Outcome: Progressing   Problem: RH KNOWLEDGE DEFICIT SCI Goal: RH STG INCREASE KNOWLEDGE OF SELF CARE AFTER SCI Description: Manage increase knowledge of self care \\after  SCI  SCI with minimal assistance  using educational materials provided Outcome: Progressing

## 2024-01-26 NOTE — Progress Notes (Signed)
 Occupational Therapy Session Note  Patient Details  Name: Katelyn [name] MRN: [id number] Date of Birth: Jun 20, 1956  Today's Date: 01/26/2024 OT Individual Time: 1300-1413 OT Individual Time Calculation (min): 73 min    Skilled Therapeutic Interventions/Progress Updates: Patient received up in w/c reporting pain well controlled. Patient assisted to therapy gym for OT treatment working on dynamic and static standing balance at raised table to simulate kitchen counter tasks. Patient worked on side stepping L - R with min assist. Continued with simple reaching tasks with LE's in contact with counter for added stability. Patient did well with LUE reaching but due to prior R RTC injury unable to reach above shoulder with the RUE. Patient tolerated standing tasks for 1 -2 minute intervals with seated breaks in between. Continued treatment with self ROM using 1 lb dowel for IR ER mobility of the RUE with elbow supported on arm rest. Continued to have stiffness and pain at the RUE, educated on rest, positioning and gentle self rom within pain free range to prevent further tightness limiting self care tasks. Continued treatment with RW mobility walking 10 feet x 2. On second walk patient had difficulty placing left foot and was assisted to slowly sit in the w/c. Treatment continued in patient room with core stability exercises in the w/c working on isometic abdominal bracing alone and in combination with LE movements. Patient fatigued at the end of the treatment session and assisted back to bed. Needs continued work on log roll to maintain BLT precautions. Continue with skilled OT POC.     Therapy Documentation Precautions:  Precautions Precautions: Fall, Back Recall of Precautions/Restrictions: Intact (Pt able to recall 3/3 spinal precautions.) Precaution/Restrictions Comments: Verbally reviewed spinal precautions, re-educated on acronym BLT. Required Braces or Orthoses: Spinal Brace Spinal Brace:  Thoracolumbosacral orthotic, Applied in sitting position Restrictions Weight Bearing Restrictions Per Provider Order: No    Pain:3/10    Therapy/Group: Individual Therapy  Marty Sleet 01/26/2024, 4:08 PM

## 2024-01-26 NOTE — Progress Notes (Signed)
 PROGRESS NOTE   Subjective/Complaints: Pt reports had episode last night was incontinent because staff couldn't help immediately- wants to increase bladder meds, however I don't feel comfortable with current dose doing so. Admits was dribbling all the time, but now is somewhat better.  Sore and aching in back. Meds helps some.  Ate 100% tray.  LBM yesterday  When I saw pt, she was asleep initially with gum in her mouth.   ROS:  Pt denies SOB, abd pain, CP, N/V/C/D, and vision changes   + R shoulder pain--ongoing. + Urge incontinence-chronic, worsened postop  Objective:   No results found.   Recent Labs    01/24/24 0400  WBC 3.8*  HGB 8.2*  HCT 25.1*  PLT 157   Recent Labs    01/25/24 0400  NA 135  K 3.8  CL 98  CO2 28  GLUCOSE 200*  BUN 29*  CREATININE 1.45*  CALCIUM  8.5*       Intake/Output Summary (Last 24 hours) at 01/26/2024 1935 Last data filed at 01/26/2024 1254 Gross per 24 hour  Intake 236 ml  Output --  Net 236 ml        Physical Exam: Vital Signs Blood pressure (!) 127/59, pulse 94, temperature 98.5 F (36.9 C), resp. rate 16, height 5\' 1"  (1.549 m), weight 88.6 kg, SpO2 97%.   General: awake, alert, appropriate, initially asleep with gum in her mouth; woke finally; NAD HENT: conjugate gaze; oropharynx moist CV: regular rate and rhythm- rate in 90's; no JVD Pulmonary: CTA B/L; no W/R/R- good air movement GI: soft, NT, ND, (+)BS- normoactive Psychiatric: appropriate- very interactive Neurological: Ox3  Skin: PICC line clean, dry, intact.  Small amount of blood around site.  Stable appearance. Back incision covered by TLSO.   MSK:      No apparent deformity. + RUE shoulder abduction/flexion limited to <70 degrees: Unchanged       +` TLSO Neurologic exam:  Cognition: AAO to person, place, time and event.  Language: Fluent, No substitutions or neoglisms. No dysarthria.   Memory: No apparent deficits  Insight: Good insight into current condition.  Mood: Pleasant affect, appropriate mood.   Sensation: To light touch reduced in distal fingertips and toes bilaterally; sensation improving at bilateral knees Reflexes: 2+ in BL UE and lower extremities CN: 2-12 grossly intact.  Coordination: No apparent tremors. No ataxia on FTN bilaterally.  Spasticity: MAS 0 in all extremities.       Strength:                RUE: 5/5 SA, 5/5 EF, 5/5 EE, 5/5 WE, 5/5 FF, 5/5 FA                LUE:  5/5 SA, 5/5 EF, 5/5 EE, 5/5 WE, 5/5 FF, 5/5 FA                RLE: 4-/5 HF, 4-/5 KE, 4/5  DF, 4/5  EHL, 4/5  PF                 LLE:  4-/5 HF, 5/5 KE, 5/5  DF, /5  EHL, 5-/5  PF   No changes  on exam 4-18  Assessment/Plan: 1. Functional deficits which require 3+ hours per day of interdisciplinary therapy in a comprehensive inpatient rehab setting. Physiatrist is providing close team supervision and 24 hour management of active medical problems listed below. Physiatrist and rehab team continue to assess barriers to discharge/monitor patient progress toward functional and medical goals  Care Tool:  Bathing    Body parts bathed by patient: Right arm, Chest, Abdomen, Right upper leg, Left upper leg, Face   Body parts bathed by helper: Left arm, Front perineal area, Buttocks, Right lower leg, Left lower leg     Bathing assist Assist Level: Moderate Assistance - Patient 50 - 74%     Upper Body Dressing/Undressing Upper body dressing   What is the patient wearing?: Button up shirt    Upper body assist Assist Level: Moderate Assistance - Patient 50 - 74%    Lower Body Dressing/Undressing Lower body dressing      What is the patient wearing?: Pants     Lower body assist Assist for lower body dressing: Total Assistance - Patient < 25%     Toileting Toileting    Toileting assist Assist for toileting: 2 Helpers     Transfers Chair/bed transfer  Transfers assist      Chair/bed transfer assist level: Minimal Assistance - Patient > 75%     Locomotion Ambulation   Ambulation assist   Ambulation activity did not occur: Safety/medical concerns (weakness, pain, decreased balance)  Assist level: Minimal Assistance - Patient > 75% Assistive device: Walker-rolling Max distance: 15'   Walk 10 feet activity   Assist  Walk 10 feet activity did not occur: Safety/medical concerns (weakness, pain, decreased balance)  Assist level: Minimal Assistance - Patient > 75% Assistive device: Walker-rolling   Walk 50 feet activity   Assist Walk 50 feet with 2 turns activity did not occur: Safety/medical concerns (weakness, pain, decreased balance)         Walk 150 feet activity   Assist Walk 150 feet activity did not occur: Safety/medical concerns (weakness, pain, decreased balance)         Walk 10 feet on uneven surface  activity   Assist Walk 10 feet on uneven surfaces activity did not occur: Safety/medical concerns (weakness, pain, decreased balance)         Wheelchair     Assist Is the patient using a wheelchair?: Yes Type of Wheelchair: Manual    Wheelchair assist level: Supervision/Verbal cueing Max wheelchair distance: 150'    Wheelchair 50 feet with 2 turns activity    Assist        Assist Level: Supervision/Verbal cueing   Wheelchair 150 feet activity     Assist      Assist Level: Supervision/Verbal cueing   Blood pressure (!) 127/59, pulse 94, temperature 98.5 F (36.9 C), resp. rate 16, height 5\' 1"  (1.549 m), weight 88.6 kg, SpO2 97%.  Medical Problem List and Plan: 1. 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  OK to stop continuous penicillin  G for showers.             -ELOS/Goals: 9-12 days, SPV PT/OT - 4/25 DC  date             -Con't CIR    -  4/8: Max a bed mobility, STEDY - very fearful of falling. She did some marching at EOB today with the walker. Min A short distances goals. Mod A UB Max A LBD and toiletting; really limited by pain and ROM in her right arm.     4/15: Confusion has cleared per therapies. Bed mobility Mod A, transfers Mod A with SPT. Walking about 15 feet with Min A. SPV with WC. Husband has been very involved and very helpful. She has some ataxia and endurance difficulty. Setup to Min A with UB care, limited by R shoulder pain. Doing well with reacher.    2.  Antithrombotics: -DVT/anticoagulation:  Mechanical: Sequential compression devices, below knee Bilateral lower extremities  -01/13/24 DVT US  neg              -antiplatelet therapy: N/A 3. Acute on chronic pain/Pain Management:  Was on Oxycodone  5 mg and Lyrica  75 mg daily PTA.  --Having surgical pain--continue Oxycodone  prn. Pain worse at nights-->will schedule oxycodone  at bedtime.  -Voltaren  gel QID -4/7: DC morphine  due to no utilization - 4/8: Oxy scheduled overnight and when taken in the a.m. causing some lethargy intermittently--patient has tolerated hydrocodone  in the past, will switch to Norco 2.5 to 5 mg every 6 hours as needed.  Will also schedule Tylenol  500 mg 3 times daily. - 4/9: LFTS up; DC scheduled tylenol . Add oxy to allergy list d/t confusion. Doing much better with Norco - 4/10: Having some a.m. confusion with p.m. Norco.  Has had issues with tramadol in the past.  Patient is already using rarely, advised to continue this regimen and wean off as tolerated. 4-14: No further confusion reported.  DC naproxen  as below. 4/19- still having pain, but feels meds are adequate 4. Mood/Behavior/Sleep: LCSW to folow for evaluation and support.              -antipsychotic agents: N/A  -Cymbalta  60mg  nightly, mirapex  1mg  at bedtime, mirtazapine  30mg  nightly  -Patient reports sleeping well  5. Neuropsych/cognition: This  patient is capable of making decisions on his own behalf.  4/8: Having increased confusion, staring episodes without new neurologic deficits.  Adjusting pain medications as above.  Has had issues with hallucinations/confusion with cefepime  in the past, would consider antibiotic adjustment if alternative causes ruled out.  4/9: episodes resolved with pain medication changes  6. Skin/Wound Care: Routine pressure relief measures. Monitor incision for healing.   4/7: Neurosurgery removed Hemovac over the weekend, current VAC with no drainage, if no further output in 24 hours will discuss removal with Dr. Philip Bravo removal in 2 days per Dr. Rochelle Chu  Wound VAC removed 4-10- -4-11: Limited visualizations due to TLSO when out of bed, nursing to place picture of wound in chart today.  7. Fluids/Electrolytes/Nutrition: Monitor I/O. Routine labs  -01/12/24 CMP stable today, Cr down to 1.55, AST 44, improving, monitor  4-7: LFTs mildly up trending, BUN and creatinine down, mild hyponatremia.  Limit Tylenol  to 3000 mg daily as above.  Repeat in 2 to 3 days.  4/9: LFTS continue up; NA 131. See below  8. Neuropathy/band like pain around waist: Will increase Lyrica  to TID 9. Osteomyelitis/discitis. Pending PICC placement. -- Starting 6 weeks of daptomycin  and ertapenem  for 6 weeks through 02/20/24 to be followed by PO suppression doxycycline  and ciprofloxacin .  -01/13/24 Cx NGTD x4d--> 1 of 2 cultures grew cutibacterium acnes--covered with current antibiotics 4-7: ESR added on to a.m. labs.  CRP up today; discussed with ID, no new orders.  4-10: Infectious disease changing ertapenem  to Cipro  due to LFT elevations as below.  Continue daptomycin ; has had issues with vancomycin  in the past 4-11: Daptomycin  switched to continuous penicillin  G yesterday afternoon.  Continuing Cipro .  Per infectious disease/pharmacy looking into insurance coverage for continuous penicillin  G cassette versus switching to Rocephin  prior to  discharge.  If switching to Rocephin , asked to be informed a few days prior so we could trial it at rehab, given significant side effects to cefepime  in the past (more likely related to accumulation with AKI - not true allergy). 4/14: tolerating rocephin  since Friday  10. HTN/edema:  Monitor BP TID--on torsemide  40mg  daily and coreg  6.125mg  BID (not reordered?). -4/5-6/25 BPs mostly fine, coreg  not ordered as of now; remain off for now 4-7: Systolic low, p.o. intakes minimal.  Reduce torsemide  for tomorrow a.m. to 20 mg, and encourage p.o. fluids today. 4-8: Diastolic remains low, will DC torsemide .  Schedule daily weights. 4/9: BP up a bit, continue to hold torsemide   4-10: Increased lower extremity edema, remains with diastolic hypotension but wishes to resume home torsemide ; will resume at 20 mg daily.  Continue teds 4-11: Peripheral edema improved, patient tolerating well.  Continue current medications. -4/12-13/25 BPs great, monitor 4/14: Diastolic soft, but stable. Monitor with therapies.  4/15: Increasing peripheral edema; diastolic remains soft but asymptomatic.  Increase torsemide  back to 40 mg daily, will give extra 20 mg dose today to catch up.  Monitor weights. 4-16: Weights down significantly.  BP tolerating current dose of torsemide .  Continue current medications. 4-18: Weights today likely inaccurate.  Peripheral edema stable.  No shortness of breath.  Vital stable.  Monitor. 4/19- Weight stable from 2 days ago- con't to monitor Vitals:   01/23/24 0500 01/23/24 0546 01/23/24 1434 01/23/24 1935  BP: (!) 132/53 (!) 132/53 (!) 117/54 115/85   01/24/24 0546 01/24/24 1454 01/24/24 1943 01/25/24 0501  BP: 127/66 (!) 122/57 (!) 131/56 (!) 136/59   01/25/24 1358 01/25/24 1936 01/26/24 0518 01/26/24 1544  BP: (!) 97/58 (!) 118/54 (!) 142/69 (!) 127/59    Filed Weights   01/25/24 0504 01/26/24 0500 01/26/24 1859  Weight: 94 kg 88.6 kg 88.6 kg    11. T2DM: Hgb A1C-7.9. Used Tresiba   50-120/ 5 units ac meal TID at home.              --Continue insulin  glargine 20 units daily with SSI.  -4/5-6/25 CBGs a bit better this weekend, monitor trend but may need further adjustments since they're still mostly >200 4/7: Blood sugars remain elevated, increase glargine to 25 units daily starting tomorrow a.m. 4/11: Blood sugars uptrending with improved p.o.; resume home standing short acting insulin , started 3 units 3 times daily WC -4/14: CBG elevated, seems to have been better overall over the weekend so we will monitor for 24 hours and if remains consistently elevated will adjust insulin  4-15: Blood sugars still tending high, with highs to this a.m., increase glargine to 30 units daily-- monitor increase 4-16 4/17: Increase Semglee  to 35 units.  Increase Premeal insulin  to 5 units standing--May need Semglee  further increased over the weekend 4/19- increased Lantus  to 38 units due to BG's still in low 200's CBG (last 3)  Recent Labs    01/26/24 0712 01/26/24 1126 01/26/24 1638  GLUCAP 210* 209* 210*    12.  Acute on CKD stage III--baseline creatinine 1.4: SCr has had rise to 1.56-->will d/c celebrex .  -01/12/24 Cr 1.55 today, down from 1.86 yesterday, monitor Monday  4-7, 4/9: BUN, creatinine improving. 4-11: BUN stable, creatinine slightly up to 1.26; monitor on Monday, may need to DC naprosyn  if up trended -01/19/24 Cr up to 1.34, still better than prior and near her baseline; will decrease naprosyn  to 250mg  BID for now, monitor on Monday labs.  4/14, 4/18: Cr stable 1.3-1.4 since resumption of home torsemide .  13. Fever blisters: Will start valtrex  1g BID x1wk as symptomatic. Improving, EOT 4/11  14. Right shoulder pain: Question strain but heard a pop this am and extremely concerned that she may have torn her RTC again --Voltaren  gel for local measures.  -- Consider MRI if no improvement 4/8: R shoulder remains very painful/limited - will get x-ray and consider MRI if no acute  findings. 4/9: Xray with mild changes; discussed with patient, unable to get MRI due to spinal cord stimulator leads, will order CT 4/10: CT nondiagnostic, some changes "suggestive of rotator cuff tear", mild before meals and glenohumeral degenerative changes.  Consulted orthopedics, appreciate Steffanie Edouard PA-C's evaluation, recommending no intervention at this time, added naproxen  500 mg twice daily. 4-14: Minimal improvements with naproxen , will DC to CKD as above   15. Constipation: Continue Senna 2 tabs daily--miralax  was added on 04/04 -01/12/24 LBM yesterday, didn't get the 32oz miralax  so d/c'd. Continue miralax  17g daily for now.  -01/13/24 LBM last night, cont regimen as is for now, adjust accordingly 4-7: Increase Senokot-S to 2 tabs twice daily, MiraLAX  BID 01/24/24 LBM   4/19- LBM yesterday 16. Depression/Anxiety: Continue Cymbalta  60mg  nightly and Mirtazapine  30mg  nightly.    17. Neurogenic bladder/urge incontinence: Continue foley. D/c in am if has results with laxative tonight. -01/12/24 had BM last night, will pull foley, do timed toileting and PVRs q4-6h, ISC if PVR >358ml -01/13/24 urinating well, low PVRs, continue checking but could d/c tomorrow if still urinating fine.  4-7: Single episode of incontinence overnight, with PVR 170s.  Otherwise continent.  Continue current regimen. - 4/16: start ditropan  2.5 mg BID. Q6H bladder scans 4-17: Increase Ditropan  to 5 mg twice daily. 4/18: Bladder scan 0, increase Ditropan  to 5 mg 4 times daily.  No fevers, dysuria or leukocytosis to indicate UTI. 4/19- pt wants to increase- I don't feel that's appropriate considering at the max dose of Oxybutynin  I would do for a 68 yr old 36. ABLA: Hgb 10.5 01/12/24, monitor  -4-7: Hemoglobin down from 10.5-9.4; no overt signs of bleeding, repeat in 1 to 2 days  4-9: Hemoglobin stable, 8.8 -01/20/24 Hgb 8.1 on 4/11 but up to 8.3 on 4/12; monitor tomorrow since still fairly stable.   -4/17 stable  19.  GERD: reordered Protonix  40mg  daily -4-10: Increase Protonix  to 40 mg twice daily for GI prophylaxis with antibiotics and now naprosyn  4-14: Will reduce Protonix  back to once daily with DC naproxen   20.  Transaminitis.  Uptrending on 4-7 labs.  -May be stasis related due to poor mobility; Tylenol  utilization within parameters  -Repeat in 2 days  4/9: Increasing. DC standing tylenol . Get liver US  today, + GGT. Per pharmacy medications unlikely contributors.   4-10: Liver ultrasound without acute findings.  Continues to uptrend; discussed with pharmacy, infectious disease, feel this is close enough to her baseline elevations but no concern but will change ertapenem  to Cipro .  4/11:  LFTs have stabilized.  Monitor.  -01/19/24 LFTs fairly stable overall, will change CMP to Monday instead of daily through the weekend. --ALP continues to uptrend, others approximately stable.  - 4/18: LFTs stable/downtrending.  21. Hyponatremia.   - ?etiology; possible with abx but unlikely   - Holding torsemide  as above   - Add 1200 cc fluid restriciton   - Serum OSM + urine OSM, NA 4/9   - repeat in AM  4-10: Improved today, 132.  Will need to monitor closely with resumption of torsemide .  Add labs in a.m.  01/19/24 stable 133  4-14, 4/18: stable   I spent a total of 38   minutes on total care today- >50% coordination of care- due to  [facility] over multiple issues when in room with pt- including her bladder issues-reviewed chart to understand the situation- decided NOT to increase Oxybutynin - did increase lantus  after review of CBGs LOS: 15 days A FACE TO FACE EVALUATION WAS PERFORMED  Jael Waldorf 01/26/2024, 7:35 PM

## 2024-01-27 LAB — GLUCOSE, CAPILLARY
Glucose-Capillary: 164 mg/dL — ABNORMAL HIGH (ref 70–99)
Glucose-Capillary: 276 mg/dL — ABNORMAL HIGH (ref 70–99)
Glucose-Capillary: 252 mg/dL — ABNORMAL HIGH (ref 70–99)
Glucose-Capillary: 183 mg/dL — ABNORMAL HIGH (ref 70–99)

## 2024-01-27 NOTE — Progress Notes (Signed)
 PROGRESS NOTE   Subjective/Complaints:  Pt reports she cannot stay awake- usually sleeps poorly but last couple of days, slept "like a log"- and cannot wake up during day.  LBM o/n.   Ate 90%+ of breakfast.     ROS:   Pt denies SOB, abd pain, CP, N/V/C/D, and vision changes   + R shoulder pain--ongoing. + Urge incontinence-chronic, worsened postop  Objective:   No results found.   No results for input(s): "WBC", "HGB", "HCT", "PLT" in the last 72 hours.  Recent Labs    01/25/24 0400  NA 135  K 3.8  CL 98  CO2 28  GLUCOSE 200*  BUN 29*  CREATININE 1.45*  CALCIUM  8.5*       Intake/Output Summary (Last 24 hours) at 01/27/2024 0937 Last data filed at 01/27/2024 0857 Gross per 24 hour  Intake 473 ml  Output --  Net 473 ml        Physical Exam: Vital Signs Blood pressure (!) 141/65, pulse 72, temperature 98.6 F (37 C), temperature source Oral, resp. rate 14, height 5\' 1"  (1.549 m), weight 86.3 kg, SpO2 97%.    General: awake, alert, appropriate, but very sleepy appearing;  NAD HENT: conjugate gaze; oropharynx moist CV: regular rate and rhythm; no JVD Pulmonary: CTA B/L; no W/R/R- good air movement GI: soft, NT, ND, (+)BS Psychiatric: appropriate but very sleepy Neurological: Ox3- delayed responses due to sedation   Skin: PICC line clean, dry, intact.  Small amount of blood around site.  Stable appearance. Back incision covered by TLSO.   MSK:      No apparent deformity. + RUE shoulder abduction/flexion limited to <70 degrees: Unchanged       +` TLSO Neurologic exam:  Cognition: AAO to person, place, time and event.  Language: Fluent, No substitutions or neoglisms. No dysarthria.  Memory: No apparent deficits  Insight: Good insight into current condition.  Mood: Pleasant affect, appropriate mood.   Sensation: To light touch reduced in distal fingertips and toes bilaterally; sensation  improving at bilateral knees Reflexes: 2+ in BL UE and lower extremities CN: 2-12 grossly intact.  Coordination: No apparent tremors. No ataxia on FTN bilaterally.  Spasticity: MAS 0 in all extremities.       Strength:                RUE: 5/5 SA, 5/5 EF, 5/5 EE, 5/5 WE, 5/5 FF, 5/5 FA                LUE:  5/5 SA, 5/5 EF, 5/5 EE, 5/5 WE, 5/5 FF, 5/5 FA                RLE: 4-/5 HF, 4-/5 KE, 4/5  DF, 4/5  EHL, 4/5  PF                 LLE:  4-/5 HF, 5/5 KE, 5/5  DF, /5  EHL, 5-/5  PF   No changes on exam 4-18  Assessment/Plan: 1. Functional deficits which require 3+ hours per day of interdisciplinary therapy in a comprehensive inpatient rehab setting. Physiatrist is providing close team supervision and 24 hour management of active medical  problems listed below. Physiatrist and rehab team continue to assess barriers to discharge/monitor patient progress toward functional and medical goals  Care Tool:  Bathing    Body parts bathed by patient: Right arm, Chest, Abdomen, Right upper leg, Left upper leg, Face   Body parts bathed by helper: Left arm, Front perineal area, Buttocks, Right lower leg, Left lower leg     Bathing assist Assist Level: Moderate Assistance - Patient 50 - 74%     Upper Body Dressing/Undressing Upper body dressing   What is the patient wearing?: Button up shirt    Upper body assist Assist Level: Moderate Assistance - Patient 50 - 74%    Lower Body Dressing/Undressing Lower body dressing      What is the patient wearing?: Pants     Lower body assist Assist for lower body dressing: Total Assistance - Patient < 25%     Toileting Toileting    Toileting assist Assist for toileting: 2 Helpers     Transfers Chair/bed transfer  Transfers assist     Chair/bed transfer assist level: Minimal Assistance - Patient > 75%     Locomotion Ambulation   Ambulation assist   Ambulation activity did not occur: Safety/medical concerns (weakness, pain,  decreased balance)  Assist level: Minimal Assistance - Patient > 75% Assistive device: Walker-rolling Max distance: 15'   Walk 10 feet activity   Assist  Walk 10 feet activity did not occur: Safety/medical concerns (weakness, pain, decreased balance)  Assist level: Minimal Assistance - Patient > 75% Assistive device: Walker-rolling   Walk 50 feet activity   Assist Walk 50 feet with 2 turns activity did not occur: Safety/medical concerns (weakness, pain, decreased balance)         Walk 150 feet activity   Assist Walk 150 feet activity did not occur: Safety/medical concerns (weakness, pain, decreased balance)         Walk 10 feet on uneven surface  activity   Assist Walk 10 feet on uneven surfaces activity did not occur: Safety/medical concerns (weakness, pain, decreased balance)         Wheelchair     Assist Is the patient using a wheelchair?: Yes Type of Wheelchair: Manual    Wheelchair assist level: Supervision/Verbal cueing Max wheelchair distance: 150'    Wheelchair 50 feet with 2 turns activity    Assist        Assist Level: Supervision/Verbal cueing   Wheelchair 150 feet activity     Assist      Assist Level: Supervision/Verbal cueing   Blood pressure (!) 141/65, pulse 72, temperature 98.6 F (37 C), temperature source Oral, resp. rate 14, height 5\' 1"  (1.549 m), weight 86.3 kg, SpO2 97%.  Medical Problem List and Plan: 1. 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  OK to stop continuous penicillin  G for showers.             -ELOS/Goals: 9-12 days, SPV PT/OT - 4/25 DC date             -con't CIR   - 4/8: Max a bed mobility, STEDY - very fearful of falling. She did some marching at EOB today with the walker. Min A short distances goals. Mod A UB Max A  LBD and toiletting;  really limited by pain and ROM in her right arm.     4/15: Confusion has cleared per therapies. Bed mobility Mod A, transfers Mod A with SPT. Walking about 15 feet with Min A. SPV with WC. Husband has been very involved and very helpful. She has some ataxia and endurance difficulty. Setup to Min A with UB care, limited by R shoulder pain. Doing well with reacher.    2.  Antithrombotics: -DVT/anticoagulation:  Mechanical: Sequential compression devices, below knee Bilateral lower extremities  -01/13/24 DVT US  neg              -antiplatelet therapy: N/A 3. Acute on chronic pain/Pain Management:  Was on Oxycodone  5 mg and Lyrica  75 mg daily PTA.  --Having surgical pain--continue Oxycodone  prn. Pain worse at nights-->will schedule oxycodone  at bedtime.  -Voltaren  gel QID -4/7: DC morphine  due to no utilization - 4/8: Oxy scheduled overnight and when taken in the a.m. causing some lethargy intermittently--patient has tolerated hydrocodone  in the past, will switch to Norco 2.5 to 5 mg every 6 hours as needed.  Will also schedule Tylenol  500 mg 3 times daily. - 4/9: LFTS up; DC scheduled tylenol . Add oxy to allergy list d/t confusion. Doing much better with Norco - 4/10: Having some a.m. confusion with p.m. Norco.  Has had issues with tramadol in the past.  Patient is already using rarely, advised to continue this regimen and wean off as tolerated. 4-14: No further confusion reported.  DC naproxen  as below. 4/19- still having pain, but feels meds are adequate 4. Mood/Behavior/Sleep: LCSW to folow for evaluation and support.              -antipsychotic agents: N/A  -Cymbalta  60mg  nightly, mirapex  1mg  at bedtime, mirtazapine  30mg  nightly  -Patient reports sleeping well  5. Neuropsych/cognition: This patient is capable of making decisions on his own behalf.  4/8: Having increased confusion, staring episodes without new neurologic deficits.  Adjusting pain medications as above.  Has  had issues with hallucinations/confusion with cefepime  in the past, would consider antibiotic adjustment if alternative causes ruled out.  4/9: episodes resolved with pain medication changes  6. Skin/Wound Care: Routine pressure relief measures. Monitor incision for healing.   4/7: Neurosurgery removed Hemovac over the weekend, current VAC with no drainage, if no further output in 24 hours will discuss removal with Dr. Philip Bravo removal in 2 days per Dr. Rochelle Chu  Wound VAC removed 4-10- -4-11: Limited visualizations due to TLSO when out of bed, nursing to place picture of wound in chart today.  7. Fluids/Electrolytes/Nutrition: Monitor I/O. Routine labs  -01/12/24 CMP stable today, Cr down to 1.55, AST 44, improving, monitor  4-7: LFTs mildly up trending, BUN and creatinine down, mild hyponatremia.  Limit Tylenol  to 3000 mg daily as above.  Repeat in 2 to 3 days.  4/9: LFTS continue up; NA 131. See below  8. Neuropathy/band like pain around waist: Will increase Lyrica  to TID 9. Osteomyelitis/discitis. Pending PICC placement. -- Starting 6 weeks of daptomycin  and ertapenem  for 6 weeks through 02/20/24 to be followed by PO suppression doxycycline  and ciprofloxacin .  -01/13/24 Cx NGTD x4d--> 1 of 2 cultures grew cutibacterium acnes--covered with current antibiotics 4-7: ESR added on to a.m. labs.  CRP up today; discussed with ID, no new orders. 4-10: Infectious disease changing ertapenem  to Cipro  due to LFT elevations as below.  Continue daptomycin ; has had issues with vancomycin  in the past 4-11: Daptomycin  switched to continuous penicillin  G yesterday afternoon.  Continuing Cipro .  Per infectious disease/pharmacy looking into insurance coverage for continuous penicillin  G cassette versus switching to Rocephin  prior to discharge.  If switching to Rocephin , asked to be informed a few days prior so we could trial it at rehab, given significant side effects to cefepime  in the past (more likely related to  accumulation with AKI - not true allergy). 4/14: tolerating rocephin  since Friday  10. HTN/edema:  Monitor BP TID--on torsemide  40mg  daily and coreg  6.125mg  BID (not reordered?). -4/5-6/25 BPs mostly fine, coreg  not ordered as of now; remain off for now 4-7: Systolic low, p.o. intakes minimal.  Reduce torsemide  for tomorrow a.m. to 20 mg, and encourage p.o. fluids today. 4-8: Diastolic remains low, will DC torsemide .  Schedule daily weights. 4/9: BP up a bit, continue to hold torsemide   4-10: Increased lower extremity edema, remains with diastolic hypotension but wishes to resume home torsemide ; will resume at 20 mg daily.  Continue teds 4-11: Peripheral edema improved, patient tolerating well.  Continue current medications. -4/12-13/25 BPs great, monitor 4/14: Diastolic soft, but stable. Monitor with therapies.  4/15: Increasing peripheral edema; diastolic remains soft but asymptomatic.  Increase torsemide  back to 40 mg daily, will give extra 20 mg dose today to catch up.  Monitor weights. 4-16: Weights down significantly.  BP tolerating current dose of torsemide .  Continue current medications. 4-18: Weights today likely inaccurate.  Peripheral edema stable.  No shortness of breath.  Vital stable.  Monitor. 4/19- Weight stable from 2 days ago- con't to monitor 4/20- weight 86.3- has been lower than normal- by 2 kg- appears a little lower than expected- will mointor Vitals:   01/23/24 0546 01/23/24 1434 01/23/24 1935 01/24/24 0546  BP: (!) 132/53 (!) 117/54 115/85 127/66   01/24/24 1454 01/24/24 1943 01/25/24 0501 01/25/24 1358  BP: (!) 122/57 (!) 131/56 (!) 136/59 (!) 97/58   01/25/24 1936 01/26/24 0518 01/26/24 1544 01/27/24 0550  BP: (!) 118/54 (!) 142/69 (!) 127/59 (!) 141/65    Filed Weights   01/26/24 0500 01/26/24 1859 01/27/24 0550  Weight: 88.6 kg 88.6 kg 86.3 kg    11. T2DM: Hgb A1C-7.9. Used Tresiba  50-120/ 5 units ac meal TID at home.              --Continue insulin  glargine  20 units daily with SSI.  -4/5-6/25 CBGs a bit better this weekend, monitor trend but may need further adjustments since they're still mostly >200 4/7: Blood sugars remain elevated, increase glargine to 25 units daily starting tomorrow a.m. 4/11: Blood sugars uptrending with improved p.o.; resume home standing short acting insulin , started 3 units 3 times daily WC -4/14: CBG elevated, seems to have been better overall over the weekend so we will monitor for 24 hours and if remains consistently elevated will adjust insulin  4-15: Blood sugars still tending high, with highs to this a.m., increase glargine to 30 units daily-- monitor increase 4-16 4/17: Increase Semglee  to 35 units.  Increase Premeal insulin  to 5 units standing--May need Semglee  further increased over the weekend 4/19- increased Lantus  to 38 units due to BG's still in low 200's 4/20- CBG this AM was 164- down and much better than prior to change in Lantus  CBG (last 3)  Recent Labs    01/26/24 1638 01/26/24 2056 01/27/24 0637  GLUCAP 210* 241* 164*    12.  Acute on CKD stage III--baseline creatinine 1.4: SCr has had rise to 1.56-->will d/c celebrex .  -01/12/24 Cr 1.55 today, down from 1.86 yesterday, monitor  Monday  4-7, 4/9: BUN, creatinine improving. 4-11: BUN stable, creatinine slightly up to 1.26; monitor on Monday, may need to DC naprosyn  if up trended -01/19/24 Cr up to 1.34, still better than prior and near her baseline; will decrease naprosyn  to 250mg  BID for now, monitor on Monday labs.  4/14, 4/18: Cr stable 1.3-1.4 since resumption of home torsemide .  13. Fever blisters: Will start valtrex  1g BID x1wk as symptomatic. Improving, EOT 4/11  14. Right shoulder pain: Question strain but heard a pop this am and extremely concerned that she may have torn her RTC again --Voltaren  gel for local measures.  -- Consider MRI if no improvement 4/8: R shoulder remains very painful/limited - will get x-ray and consider MRI if no  acute findings. 4/9: Xray with mild changes; discussed with patient, unable to get MRI due to spinal cord stimulator leads, will order CT 4/10: CT nondiagnostic, some changes "suggestive of rotator cuff tear", mild before meals and glenohumeral degenerative changes.  Consulted orthopedics, appreciate Steffanie Edouard PA-C's evaluation, recommending no intervention at this time, added naproxen  500 mg twice daily. 4-14: Minimal improvements with naproxen , will DC to CKD as above   15. Constipation: Continue Senna 2 tabs daily--miralax  was added on 04/04 -01/12/24 LBM yesterday, didn't get the 32oz miralax  so d/c'd. Continue miralax  17g daily for now.  -01/13/24 LBM last night, cont regimen as is for now, adjust accordingly 4-7: Increase Senokot-S to 2 tabs twice daily, MiraLAX  BID 01/24/24 LBM   4/19- LBM yesterday 16. Depression/Anxiety: Continue Cymbalta  60mg  nightly and Mirtazapine  30mg  nightly.    17. Neurogenic bladder/urge incontinence: Continue foley. D/c in am if has results with laxative tonight. -01/12/24 had BM last night, will pull foley, do timed toileting and PVRs q4-6h, ISC if PVR >360ml -01/13/24 urinating well, low PVRs, continue checking but could d/c tomorrow if still urinating fine.  4-7: Single episode of incontinence overnight, with PVR 170s.  Otherwise continent.  Continue current regimen. - 4/16: start ditropan  2.5 mg BID. Q6H bladder scans 4-17: Increase Ditropan  to 5 mg twice daily. 4/18: Bladder scan 0, increase Ditropan  to 5 mg 4 times daily.  No fevers, dysuria or leukocytosis to indicate UTI. 4/19- pt wants to increase- I don't feel that's appropriate considering at the max dose of Oxybutynin  I would do for a 68 yr old 76. ABLA: Hgb 10.5 01/12/24, monitor  -4-7: Hemoglobin down from 10.5-9.4; no overt signs of bleeding, repeat in 1 to 2 days  4-9: Hemoglobin stable, 8.8 -01/20/24 Hgb 8.1 on 4/11 but up to 8.3 on 4/12; monitor tomorrow since still fairly stable.   -4/17  stable  19. GERD: reordered Protonix  40mg  daily -4-10: Increase Protonix  to 40 mg twice daily for GI prophylaxis with antibiotics and now naprosyn  4-14: Will reduce Protonix  back to once daily with DC naproxen   20.  Transaminitis.  Uptrending on 4-7 labs.  -May be stasis related due to poor mobility; Tylenol  utilization within parameters  -Repeat in 2 days  4/9: Increasing. DC standing tylenol . Get liver US  today, + GGT. Per pharmacy medications unlikely contributors.   4-10: Liver ultrasound without acute findings.  Continues to uptrend; discussed with pharmacy, infectious disease, feel this is close enough to her baseline elevations but no concern but will change ertapenem  to Cipro .  4/11:  LFTs have stabilized.  Monitor.  -01/19/24 LFTs fairly stable overall, will change CMP to Monday instead of daily through the weekend. --ALP continues to uptrend, others approximately stable.  - 4/18: LFTs  stable/downtrending.  21. Hyponatremia.   - ?etiology; possible with abx but unlikely   - Holding torsemide  as above   - Add 1200 cc fluid restriciton   - Serum OSM + urine OSM, NA 4/9   - repeat in AM  4-10: Improved today, 132.  Will need to monitor closely with resumption of torsemide .  Add labs in a.m.  01/19/24 stable 133  4-14, 4/18: stable 22. Sedation/sleepiness  4/20- I think it's Oxybutynin  that's cuasing sedation- I suggest moving to Myrbetriq  since causes less sedation- but don't want to make change without d/w primary physician- will leave to Dr Dorn Gaskins.   I spent a total of 36   minutes on total care today- >50% coordination of care- due to  D/w pt about sedation- review of labs, vitals and  meds- most likely cause is Oxybutynin  for sedation? But Dr Buckley Card knows pt better than I.    LOS: 16 days A FACE TO FACE EVALUATION WAS PERFORMED  Erastus Bartolomei 01/27/2024, 9:37 AM

## 2024-01-27 NOTE — Plan of Care (Signed)
  Problem: Consults Goal: RH SPINAL CORD INJURY PATIENT EDUCATION Description:  See Patient Education module for education specifics.  Outcome: Progressing Goal: Skin Care Protocol Initiated - if Braden Score 18 or less Description: If consults are not indicated, leave blank or document N/A Outcome: Progressing Goal: Nutrition Consult-if indicated Outcome: Progressing Goal: Diabetes Guidelines if Diabetic/Glucose > 140 Description: If diabetic or lab glucose is > 140 mg/dl - Initiate Diabetes/Hyperglycemia Guidelines & Document Interventions  Outcome: Progressing   Problem: SCI BOWEL ELIMINATION Goal: RH STG MANAGE BOWEL WITH ASSISTANCE Description: STG Manage Bowel with minimal  Assistance. Outcome: Progressing Goal: RH STG SCI MANAGE BOWEL WITH MEDICATION WITH ASSISTANCE Description: STG SCI Manage bowel with medication with minimal assistance. Outcome: Progressing   Problem: SCI BLADDER ELIMINATION Goal: RH STG MANAGE BLADDER WITH ASSISTANCE Description: STG Manage Bladder With minimal Assistance Outcome: Progressing Goal: RH STG MANAGE BLADDER WITH MEDICATION WITH ASSISTANCE Description: STG Manage Bladder With Medication With minimal Assistance. Outcome: Progressing   Problem: RH SKIN INTEGRITY Goal: RH STG SKIN FREE OF INFECTION/BREAKDOWN Outcome: Progressing Goal: RH STG MAINTAIN SKIN INTEGRITY WITH ASSISTANCE Description: STG Maintain Skin Integrity With Assistance. Outcome: Progressing Goal: RH STG ABLE TO PERFORM INCISION/WOUND CARE W/ASSISTANCE Description: STG Able To Perform Incision/Wound Care With Assistance. Outcome: Progressing   Problem: RH SAFETY Goal: RH STG ADHERE TO SAFETY PRECAUTIONS W/ASSISTANCE/DEVICE Description: STG Adhere to Safety Precautions With minimal Assistance/Device. Outcome: Progressing Goal: RH STG DECREASED RISK OF FALL WITH ASSISTANCE Description: STG Decreased Risk of Fall With minimal Assistance. Outcome: Progressing   Problem:  RH PAIN MANAGEMENT Goal: RH STG PAIN MANAGED AT OR BELOW PT'S PAIN GOAL Description: <4 w/ prns Outcome: Progressing   Problem: RH KNOWLEDGE DEFICIT SCI Goal: RH STG INCREASE KNOWLEDGE OF SELF CARE AFTER SCI Outcome: Progressing   Problem: Consults Goal: RH SPINAL CORD INJURY PATIENT EDUCATION Description:  See Patient Education module for education specifics.  Outcome: Progressing   Problem: SCI BOWEL ELIMINATION Goal: RH STG MANAGE BOWEL WITH ASSISTANCE Description: STG Manage Bowel with Assistance. Outcome: Progressing   Problem: SCI BLADDER ELIMINATION Goal: RH STG MANAGE BLADDER WITH ASSISTANCE Description: STG Manage Bladder With Assistance Outcome: Progressing   Problem: RH SKIN INTEGRITY Goal: RH STG SKIN FREE OF INFECTION/BREAKDOWN Outcome: Progressing   Problem: RH SAFETY Goal: RH STG ADHERE TO SAFETY PRECAUTIONS W/ASSISTANCE/DEVICE Description: STG Adhere to Safety Precautions With minimal  Assistance/Device. Outcome: Progressing   Problem: RH PAIN MANAGEMENT Goal: RH STG PAIN MANAGED AT OR BELOW PT'S PAIN GOAL Outcome: Progressing   Problem: RH KNOWLEDGE DEFICIT SCI Goal: RH STG INCREASE KNOWLEDGE OF SELF CARE AFTER SCI Description: Manage increase knowledge of self care \\after  SCI  SCI with minimal assistance  using educational materials provided Outcome: Progressing

## 2024-01-28 ENCOUNTER — Ambulatory Visit: Payer: PPO | Admitting: Family

## 2024-01-28 DIAGNOSIS — E1165 Type 2 diabetes mellitus with hyperglycemia: Secondary | ICD-10-CM

## 2024-01-28 DIAGNOSIS — Z794 Long term (current) use of insulin: Secondary | ICD-10-CM

## 2024-01-28 DIAGNOSIS — R609 Edema, unspecified: Secondary | ICD-10-CM

## 2024-01-28 DIAGNOSIS — G894 Chronic pain syndrome: Secondary | ICD-10-CM

## 2024-01-28 LAB — GLUCOSE, CAPILLARY
Glucose-Capillary: 210 mg/dL — ABNORMAL HIGH (ref 70–99)
Glucose-Capillary: 198 mg/dL — ABNORMAL HIGH (ref 70–99)
Glucose-Capillary: 265 mg/dL — ABNORMAL HIGH (ref 70–99)
Glucose-Capillary: 321 mg/dL — ABNORMAL HIGH (ref 70–99)

## 2024-01-28 LAB — COMPREHENSIVE METABOLIC PANEL WITH GFR
ALT: 37 U/L (ref 0–44)
AST: 36 U/L (ref 15–41)
Albumin: 2.3 g/dL — ABNORMAL LOW (ref 3.5–5.0)
Alkaline Phosphatase: 263 U/L — ABNORMAL HIGH (ref 38–126)
Anion gap: 9 (ref 5–15)
BUN: 25 mg/dL — ABNORMAL HIGH (ref 8–23)
CO2: 28 mmol/L (ref 22–32)
Calcium: 8.6 mg/dL — ABNORMAL LOW (ref 8.9–10.3)
Chloride: 95 mmol/L — ABNORMAL LOW (ref 98–111)
Creatinine, Ser: 1.37 mg/dL — ABNORMAL HIGH (ref 0.44–1.00)
GFR, Estimated: 43 mL/min — ABNORMAL LOW (ref >60)
Glucose, Bld: 220 mg/dL — ABNORMAL HIGH (ref 70–99)
Potassium: 3.8 mmol/L (ref 3.5–5.1)
Sodium: 132 mmol/L — ABNORMAL LOW (ref 135–145)
Total Bilirubin: 0.5 mg/dL (ref 0.0–1.2)
Total Protein: 6 g/dL — ABNORMAL LOW (ref 6.5–8.1)

## 2024-01-28 LAB — C-REACTIVE PROTEIN: CRP: 2 mg/dL — ABNORMAL HIGH (ref <1.0)

## 2024-01-28 LAB — CBC
HCT: 25.7 % — ABNORMAL LOW (ref 36.0–46.0)
Hemoglobin: 8.2 g/dL — ABNORMAL LOW (ref 12.0–15.0)
MCH: 29.2 pg (ref 26.0–34.0)
MCHC: 31.9 g/dL (ref 30.0–36.0)
MCV: 91.5 fL (ref 80.0–100.0)
Platelets: 170 10*3/uL (ref 150–400)
RBC: 2.81 MIL/uL — ABNORMAL LOW (ref 3.87–5.11)
RDW: 14.6 % (ref 11.5–15.5)
WBC: 4.1 10*3/uL (ref 4.0–10.5)
nRBC: 0 % (ref 0.0–0.2)

## 2024-01-28 LAB — SEDIMENTATION RATE: Sed Rate: 90 mm/h — ABNORMAL HIGH (ref 0–22)

## 2024-01-28 MED ORDER — PREGABALIN 50 MG PO CAPS
100.0000 mg | ORAL_CAPSULE | Freq: Three times a day (TID) | ORAL | Status: DC
  Administered 2024-01-28 – 2024-01-31 (×10): 100 mg via ORAL
  Filled 2024-01-28 (×10): qty 2

## 2024-01-28 NOTE — Progress Notes (Signed)
 Physical Therapy Session Note  Patient Details  Name: Katelyn [name] MRN: [id number] Date of Birth: 02-Aug-1956  Today's Date: 01/28/2024 PT Individual Time: 1102-1130 PT Individual Time Calculation (min): 28 min   Short Term Goals: Week 3:  PT Short Term Goal 1 (Week 3): STG = LTG due to ELOS  Skilled Therapeutic Interventions/Progress Updates:      Pt sitting up in wheelchair with her spouse present. Pt report 5/10 R shoulder pain related to overuse from weight bearing with ambulation through her RW. Mobility and distraction provided for pain management.   Transported at w/c level to day room rehab gym. Assisted onto Nustep with CGA stand pivot transfer using the RW. SetupA needed for BLE management. She completed x15 minutes using [employer] program to provide visual cue for maintaining target spm of 70. She completed at L5 resistance using BUE/BLE and achieved > 950 steps and 0.5 miles total. Min cues for keeping hips and knees aligned and to reduce her natural tendency to "frog leg."  Returned to her room and patient requesting to lie down to rest. MinA for stand pivot transfer using bed rail to grab. Assist for comfort to position her with pillows supporting her and heels floated on pillows. All needs met at end.   Therapy Documentation Precautions:  Precautions Precautions: Fall, Back Recall of Precautions/Restrictions: Intact (Pt able to recall 3/3 spinal precautions.) Precaution/Restrictions Comments: Verbally reviewed spinal precautions, re-educated on acronym BLT. Required Braces or Orthoses: Spinal Brace Spinal Brace: Thoracolumbosacral orthotic, Applied in sitting position Restrictions Weight Bearing Restrictions Per Provider Order: No    Therapy/Group: Individual Therapy  Pheobe Brass 01/28/2024, 11:31 AM

## 2024-01-28 NOTE — Progress Notes (Signed)
 Physical Therapy Session Note  Patient Details  Name: Katelyn [name] MRN: [id number] Date of Birth: [birth year]  Today's Date: 01/28/2024 PT Individual Time: 4098-1191 PT Individual Time Calculation (min): 70 min   Short Term Goals: Week 3:  PT Short Term Goal 1 (Week 3): STG = LTG due to ELOS  Skilled Therapeutic Interventions/Progress Updates:     Pt received seated in [facility] and agrees to therapy. Reports some pain in back, but says "it's not bad." PT provides rest breaks as needed to manage pain. WC transport to gym. Sit to stand with CGA and cues for initiation. Pt ambulates x35' with RW and CGA, with cues for increasing stride width to improve balance, and decreasing WB through RW for energy conservation and body mechanics. Following rest break, pt ambulates same distance and requires minA due to crossing over midline with LLE. Following rest break, pt completes 3x10 seated clamshells with green theraband to strengthen hip abductors and external rotators. Pt then completes 2x10 minisquats with green theraband around distal thighs, with cues for body mechanics and optimal sequencing. Pt attempts standing hip abduction but attempting this movement aggravates back pain. PT demonstrates supine hip abduction with progression of sidelying hip abduction against gravity. Pt then ambulates x50' with minA at same cues, with +2 WC follow for safety. WC transport back to room. Left seated with all needs within reach.   Therapy Documentation Precautions:  Precautions Precautions: Fall, Back Recall of Precautions/Restrictions: Intact (Pt able to recall 3/3 spinal precautions.) Precaution/Restrictions Comments: Verbally reviewed spinal precautions, re-educated on acronym BLT. Required Braces or Orthoses: Spinal Brace Spinal Brace: Thoracolumbosacral orthotic, Applied in sitting position Restrictions Weight Bearing Restrictions Per Provider Order: No    Therapy/Group: Individual Therapy  Neva Barban,  PT, DPT 01/28/2024, 3:58 PM

## 2024-01-28 NOTE — Progress Notes (Signed)
 PROGRESS NOTE   Subjective/Complaints:  Pt reports yesterday night had a "circle of pain" in her left mid/lower back. Pain improved with norco.  LBM  4/20    ROS:   Pt denies fever, chills, SOB, abd pain, CP, N/V/C/D, and vision changes   + R shoulder pain--ongoing. + Urge incontinence-chronic, worsened postop +LE edema  Objective:   No results found.   Recent Labs    01/28/24 0512  WBC 4.1  HGB 8.2*  HCT 25.7*  PLT 170    Recent Labs    01/28/24 0512  NA 132*  K 3.8  CL 95*  CO2 28  GLUCOSE 220*  BUN 25*  CREATININE 1.37*  CALCIUM  8.6*       Intake/Output Summary (Last 24 hours) at 01/28/2024 1332 Last data filed at 01/28/2024 1252 Gross per 24 hour  Intake 700 ml  Output --  Net 700 ml        Physical Exam: Vital Signs Blood pressure (!) 125/57, pulse 99, temperature 98.6 F (37 C), temperature source Oral, resp. rate 17, height 5\' 1"  (1.549 m), weight 88.5 kg, SpO2 95%.    General: awake, alert, appropriate, NAD HENT: conjugate gaze; oropharynx moist CV: regular rate and rhythm; no JVD Pulmonary: CTA B/L; no W/R/R- good air movement GI: soft, NT, ND, (+)BS Psychiatric: appropriate, cooperative  Neurological: Ox3   Skin: PICC line clean, dry, intact.  Small amount of blood around site.  Stable appearance. Back incision covered by TLSO.   MSK:      No apparent deformity. + RUE shoulder abduction/flexion limited to <70 degrees: Unchanged       +` TLSO Neurologic exam:  Cognition: AAO to person, place, time and event.  Language: Fluent, No substitutions or neoglisms. No dysarthria.  Memory: No apparent deficits  Insight: Good insight into current condition.  Mood: Pleasant affect, appropriate mood.   Sensation: To light touch reduced in distal fingertips and toes bilaterally; sensation improving at bilateral knees Reflexes: 2+ in BL UE and lower extremities CN: 2-12 grossly  intact.  Coordination: No apparent tremors. No ataxia on FTN bilaterally.  Spasticity: MAS 0 in all extremities.       Strength:                RUE: 5/5 SA, 5/5 EF, 5/5 EE, 5/5 WE, 5/5 FF, 5/5 FA                LUE:  5/5 SA, 5/5 EF, 5/5 EE, 5/5 WE, 5/5 FF, 5/5 FA                RLE: 4-/5 HF, 4-/5 KE, 4/5  DF, 4/5  EHL, 4/5  PF                 LLE:  4-/5 HF, 5/5 KE, 5/5  DF, /5  EHL, 5-/5  PF   No changes on exam 4-18  Assessment/Plan: 1. Functional deficits which require 3+ hours per day of interdisciplinary therapy in a comprehensive inpatient rehab setting. Physiatrist is providing close team supervision and 24 hour management of active medical problems listed below. Physiatrist and rehab team continue to assess barriers  to discharge/monitor patient progress toward functional and medical goals  Care Tool:  Bathing    Body parts bathed by patient: Right arm, Chest, Abdomen, Right upper leg, Left upper leg, Face, Left arm, Front perineal area, Buttocks, Right lower leg, Left lower leg   Body parts bathed by helper: Left arm, Front perineal area, Buttocks, Right lower leg, Left lower leg     Bathing assist Assist Level: Supervision/Verbal cueing     Upper Body Dressing/Undressing Upper body dressing   What is the patient wearing?: Pull over shirt    Upper body assist Assist Level: Set up assist    Lower Body Dressing/Undressing Lower body dressing      What is the patient wearing?: Pants, Incontinence brief     Lower body assist Assist for lower body dressing: Minimal Assistance - Patient > 75%     Toileting Toileting    Toileting assist Assist for toileting: Minimal Assistance - Patient > 75%     Transfers Chair/bed transfer  Transfers assist     Chair/bed transfer assist level: Minimal Assistance - Patient > 75%     Locomotion Ambulation   Ambulation assist   Ambulation activity did not occur: Safety/medical concerns (weakness, pain, decreased  balance)  Assist level: Minimal Assistance - Patient > 75% Assistive device: Walker-rolling Max distance: 15'   Walk 10 feet activity   Assist  Walk 10 feet activity did not occur: Safety/medical concerns (weakness, pain, decreased balance)  Assist level: Minimal Assistance - Patient > 75% Assistive device: Walker-rolling   Walk 50 feet activity   Assist Walk 50 feet with 2 turns activity did not occur: Safety/medical concerns (weakness, pain, decreased balance)         Walk 150 feet activity   Assist Walk 150 feet activity did not occur: Safety/medical concerns (weakness, pain, decreased balance)         Walk 10 feet on uneven surface  activity   Assist Walk 10 feet on uneven surfaces activity did not occur: Safety/medical concerns (weakness, pain, decreased balance)         Wheelchair     Assist Is the patient using a wheelchair?: Yes Type of Wheelchair: Manual    Wheelchair assist level: Supervision/Verbal cueing Max wheelchair distance: 150'    Wheelchair 50 feet with 2 turns activity    Assist        Assist Level: Supervision/Verbal cueing   Wheelchair 150 feet activity     Assist      Assist Level: Supervision/Verbal cueing   Blood pressure (!) 125/57, pulse 99, temperature 98.6 F (37 C), temperature source Oral, resp. rate 17, height 5\' 1"  (1.549 m), weight 88.5 kg, SpO2 95%.  Medical Problem List and Plan: 1. 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  OK to stop continuous penicillin  G for showers.             -ELOS/Goals: 9-12 days, SPV PT/OT - 4/25 DC date             -con't CIR   - 4/8: Max a bed mobility, STEDY - very fearful of falling. She did some marching at EOB today with the walker. Min A short distances goals. Mod A UB Max A LBD and  toiletting; really limited  by pain and ROM in her right arm.     4/15: Confusion has cleared per therapies. Bed mobility Mod A, transfers Mod A with SPT. Walking about 15 feet with Min A. SPV with WC. Husband has been very involved and very helpful. She has some ataxia and endurance difficulty. Setup to Min A with UB care, limited by R shoulder pain. Doing well with reacher.   4/21 team conference tomorrow   2.  Antithrombotics: -DVT/anticoagulation:  Mechanical: Sequential compression devices, below knee Bilateral lower extremities  -01/13/24 DVT US  neg              -antiplatelet therapy: N/A 3. Acute on chronic pain/Pain Management:  Was on Oxycodone  5 mg and Lyrica  75 mg daily PTA.  --Having surgical pain--continue Oxycodone  prn. Pain worse at nights-->will schedule oxycodone  at bedtime.  -Voltaren  gel QID -4/7: DC morphine  due to no utilization - 4/8: Oxy scheduled overnight and when taken in the a.m. causing some lethargy intermittently--patient has tolerated hydrocodone  in the past, will switch to Norco 2.5 to 5 mg every 6 hours as needed.  Will also schedule Tylenol  500 mg 3 times daily. - 4/9: LFTS up; DC scheduled tylenol . Add oxy to allergy list d/t confusion. Doing much better with Norco - 4/10: Having some a.m. confusion with p.m. Norco.  Has had issues with tramadol in the past.  Patient is already using rarely, advised to continue this regimen and wean off as tolerated. 4-14: No further confusion reported.  DC naproxen  as below. 4/19- still having pain, but feels meds are adequate 4/21  Pregabalin  increased  100mg  TID from 75mg  TID- likely max dose with her renal function  4. Mood/Behavior/Sleep: LCSW to folow for evaluation and support.              -antipsychotic agents: N/A  -Cymbalta  60mg  nightly, mirapex  1mg  at bedtime, mirtazapine  30mg  nightly  -Patient reports sleeping well  5. Neuropsych/cognition: This patient is capable of making decisions on his own behalf.  4/8:  Having increased confusion, staring episodes without new neurologic deficits.  Adjusting pain medications as above.  Has had issues with hallucinations/confusion with cefepime  in the past, would consider antibiotic adjustment if alternative causes ruled out.  4/9: episodes resolved with pain medication changes  6. Skin/Wound Care: Routine pressure relief measures. Monitor incision for healing.   4/7: Neurosurgery removed Hemovac over the weekend, current VAC with no drainage, if no further output in 24 hours will discuss removal with Dr. Philip Bravo removal in 2 days per Dr. Rochelle Chu  Wound VAC removed 4-10- -4-11: Limited visualizations due to TLSO when out of bed, nursing to place picture of wound in chart today.  7. Fluids/Electrolytes/Nutrition: Monitor I/O. Routine labs  -01/12/24 CMP stable today, Cr down to 1.55, AST 44, improving, monitor  4-7: LFTs mildly up trending, BUN and creatinine down, mild hyponatremia.  Limit Tylenol  to 3000 mg daily as above.  Repeat in 2 to 3 days.  4/9: LFTS continue up; NA 131. See below  8. Neuropathy/band like pain around waist: Will increase Lyrica  to TID 9. Osteomyelitis/discitis. Pending PICC placement. -- Starting 6 weeks of daptomycin  and ertapenem  for 6 weeks through 02/20/24 to be followed by PO suppression doxycycline  and ciprofloxacin .  -01/13/24 Cx NGTD x4d--> 1 of 2 cultures grew cutibacterium acnes--covered with current antibiotics 4-7: ESR added on to a.m. labs.  CRP up today; discussed with ID, no new orders. 4-10: Infectious disease changing ertapenem  to Cipro  due to LFT elevations as below.  Continue daptomycin ; has had issues with vancomycin  in the past 4-11: Daptomycin  switched to continuous penicillin  G yesterday afternoon.  Continuing Cipro .  Per infectious disease/pharmacy looking into insurance coverage for continuous penicillin  G cassette versus switching to Rocephin  prior to discharge.  If switching to Rocephin , asked to be informed a few  days prior so we could trial it at rehab, given significant side effects to cefepime  in the past (more likely related to accumulation with AKI - not true allergy). 4/14: tolerating rocephin  since Friday  10. HTN/edema:  Monitor BP TID--on torsemide  40mg  daily and coreg  6.125mg  BID (not reordered?). -4/5-6/25 BPs mostly fine, coreg  not ordered as of now; remain off for now 4-7: Systolic low, p.o. intakes minimal.  Reduce torsemide  for tomorrow a.m. to 20 mg, and encourage p.o. fluids today. 4-8: Diastolic remains low, will DC torsemide .  Schedule daily weights. 4/9: BP up a bit, continue to hold torsemide   4-10: Increased lower extremity edema, remains with diastolic hypotension but wishes to resume home torsemide ; will resume at 20 mg daily.  Continue teds 4-11: Peripheral edema improved, patient tolerating well.  Continue current medications. -4/12-13/25 BPs great, monitor 4/14: Diastolic soft, but stable. Monitor with therapies.  4/15: Increasing peripheral edema; diastolic remains soft but asymptomatic.  Increase torsemide  back to 40 mg daily, will give extra 20 mg dose today to catch up.  Monitor weights. 4-16: Weights down significantly.  BP tolerating current dose of torsemide .  Continue current medications. 4-18: Weights today likely inaccurate.  Peripheral edema stable.  No shortness of breath.  Vital stable.  Monitor. 4/19- Weight stable from 2 days ago- con't to monitor 4/20- weight 86.3- has been lower than normal- by 2 kg- appears a little lower than expected- will mointor 4/21 BP stable overall, wt stable, discussed leg elevated for edema  Vitals:   01/24/24 0546 01/24/24 1454 01/24/24 1943 01/25/24 0501  BP: 127/66 (!) 122/57 (!) 131/56 (!) 136/59   01/25/24 1358 01/25/24 1936 01/26/24 0518 01/26/24 1544  BP: (!) 97/58 (!) 118/54 (!) 142/69 (!) 127/59   01/27/24 0550 01/27/24 1315 01/27/24 2015 01/28/24 0517  BP: (!) 141/65 130/65 (!) 124/59 (!) 125/57    Filed Weights    01/27/24 0550 01/28/24 0500 01/28/24 0604  Weight: 86.3 kg 92 kg 88.5 kg    11. T2DM: Hgb A1C-7.9. Used Tresiba  50-120/ 5 units ac meal TID at home.              --Continue insulin  glargine 20 units daily with SSI.  -4/5-6/25 CBGs a bit better this weekend, monitor trend but may need further adjustments since they're still mostly >200 4/7: Blood sugars remain elevated, increase glargine to 25 units daily starting tomorrow a.m. 4/11: Blood sugars uptrending with improved p.o.; resume home standing short acting insulin , started 3 units 3 times daily WC -4/14: CBG elevated, seems to have been better overall over the weekend so we will monitor for 24 hours and if remains consistently elevated will adjust insulin  4-15: Blood sugars still tending high, with highs to this a.m., increase glargine to 30 units daily-- monitor increase 4-16 4/17: Increase Semglee  to 35 units.  Increase Premeal insulin  to 5 units standing--May need Semglee  further increased over the weekend 4/19- increased Lantus  to 38 units due to BG's still in low 200's 4/20- CBG this AM was 164- down and much better than prior to change in Lantus  4/21 monitor CBGs today, continue increase lantus  tomorrow  CBG (last 3)  Recent Labs  01/27/24 2122 01/28/24 0602 01/28/24 1150  GLUCAP 183* 210* 198*    12.  Acute on CKD stage III--baseline creatinine 1.4: SCr has had rise to 1.56-->will d/c celebrex .  -01/12/24 Cr 1.55 today, down from 1.86 yesterday, monitor Monday  4-7, 4/9: BUN, creatinine improving. 4-11: BUN stable, creatinine slightly up to 1.26; monitor on Monday, may need to DC naprosyn  if up trended -01/19/24 Cr up to 1.34, still better than prior and near her baseline; will decrease naprosyn  to 250mg  BID for now, monitor on Monday labs.  4/14, 4/18: Cr stable 1.3-1.4 since resumption of home torsemide . 4/21 BUN/CR stable, continue current regimen   13. Fever blisters: Will start valtrex  1g BID x1wk as symptomatic.  Improving, EOT 4/11  14. Right shoulder pain: Question strain but heard a pop this am and extremely concerned that she may have torn her RTC again --Voltaren  gel for local measures.  -- Consider MRI if no improvement 4/8: R shoulder remains very painful/limited - will get x-ray and consider MRI if no acute findings. 4/9: Xray with mild changes; discussed with patient, unable to get MRI due to spinal cord stimulator leads, will order CT 4/10: CT nondiagnostic, some changes "suggestive of rotator cuff tear", mild before meals and glenohumeral degenerative changes.  Consulted orthopedics, appreciate Steffanie Edouard PA-C's evaluation, recommending no intervention at this time, added naproxen  500 mg twice daily. 4-14: Minimal improvements with naproxen , will DC to CKD as above   15. Constipation: Continue Senna 2 tabs daily--miralax  was added on 04/04 -01/12/24 LBM yesterday, didn't get the 32oz miralax  so d/c'd. Continue miralax  17g daily for now.  -01/13/24 LBM last night, cont regimen as is for now, adjust accordingly 4-7: Increase Senokot-S to 2 tabs twice daily, MiraLAX  BID 01/24/24 LBM   4/19- LBM yesterday 4/21 LBM yesterday, monitor 16. Depression/Anxiety: Continue Cymbalta  60mg  nightly and Mirtazapine  30mg  nightly.    17. Neurogenic bladder/urge incontinence: Continue foley. D/c in am if has results with laxative tonight. -01/12/24 had BM last night, will pull foley, do timed toileting and PVRs q4-6h, ISC if PVR >323ml -01/13/24 urinating well, low PVRs, continue checking but could d/c tomorrow if still urinating fine.  4-7: Single episode of incontinence overnight, with PVR 170s.  Otherwise continent.  Continue current regimen. - 4/16: start ditropan  2.5 mg BID. Q6H bladder scans 4-17: Increase Ditropan  to 5 mg twice daily. 4/18: Bladder scan 0, increase Ditropan  to 5 mg 4 times daily.  No fevers, dysuria or leukocytosis to indicate UTI. 4/19- pt wants to increase- I don't feel that's  appropriate considering at the max dose of Oxybutynin  I would do for a 68 yr old 35. ABLA: Hgb 10.5 01/12/24, monitor  -4-7: Hemoglobin down from 10.5-9.4; no overt signs of bleeding, repeat in 1 to 2 days  4-9: Hemoglobin stable, 8.8 -01/20/24 Hgb 8.1 on 4/11 but up to 8.3 on 4/12; monitor tomorrow since still fairly stable.   -4/17 stable  19. GERD: reordered Protonix  40mg  daily -4-10: Increase Protonix  to 40 mg twice daily for GI prophylaxis with antibiotics and now naprosyn  4-14: Will reduce Protonix  back to once daily with DC naproxen   20.  Transaminitis.  Uptrending on 4-7 labs.  -May be stasis related due to poor mobility; Tylenol  utilization within parameters  -Repeat in 2 days  4/9: Increasing. DC standing tylenol . Get liver US  today, + GGT. Per pharmacy medications unlikely contributors.   4-10: Liver ultrasound without acute findings.  Continues to uptrend; discussed with pharmacy, infectious disease, feel this  is close enough to her baseline elevations but no concern but will change ertapenem  to Cipro .  4/11:  LFTs have stabilized.  Monitor.  -01/19/24 LFTs fairly stable overall, will change CMP to Monday instead of daily through the weekend. --ALP continues to uptrend, others approximately stable.  - 4/18: LFTs stable/downtrending.  21. Hyponatremia.   - ?etiology; possible with abx but unlikely   - Holding torsemide  as above   - Add 1200 cc fluid restriciton   - Serum OSM + urine OSM, NA 4/9   - repeat in AM  4-10: Improved today, 132.  Will need to monitor closely with resumption of torsemide .  Add labs in a.m.  01/19/24 stable 133  4-14, 4/18: stable 22. Sedation/sleepiness  4/20- I think it's Oxybutynin  that's cuasing sedation- I suggest moving to Myrbetriq  since causes less sedation- but don't want to make change without d/w primary physician- will leave to Dr Dorn Gaskins.     LOS: 17 days A FACE TO FACE EVALUATION WAS PERFORMED  Lylia Sand 01/28/2024, 1:32 PM

## 2024-01-28 NOTE — Progress Notes (Signed)
 Occupational Therapy Session Note  Patient Details  Name: Katelyn [name] MRN: [id number] Date of Birth: 07-26-1956  Today's Date: 01/28/2024 OT Individual Time: 4098-1191 & 1005-1100 OT Individual Time Calculation (min): 70 min & 55 min   Short Term Goals: Week 3:  OT Short Term Goal 1 (Week 3): STGs=LTGs due to patient's estimated length of stay.  Skilled Therapeutic Interventions/Progress Updates:  Session 1 Skilled OT intervention completed with focus on ADL retraining, functional endurance, and mobility within a shower context. Pt received seated in recliner, agreeable to session. 4/10 pain reported in surgical spinal region, reporting a poor night with pain; pre-medicated. OT offered rest breaks, repositioning and moist heat via shower for pain relief.  Pt completed all sit > stands using RW/grab bar with CGA/light min A, and ambulatory transfers with CGA/min A with RW. Min cues needed for correcting ataxic gait, maintaining proximity of RW and body positioning for fall prevention.  Nurse present for meds. Indicated that spinal incision did not need covering due to healed status. Waterproof cover applied to PICC with seal skin prior to shower. Ambulated > toilet, min A for lowering clothing, continent of urinary void. Supervision pericare. Ambulated > BSC in shower. Pt was able to bathe all parts with supervision, at the seated level while using long handled sponge to access BLE and hole of BSC for periarea. Cues needed for maintaining back precautions intermittently. Ambulated > toilet. Able to donn shirt/deo with set up A. Threaded LB clothing with use of reacher at the CGA/min A sit > stand level with RW. Donned TEDS/shoes due to time constraint. Min A needed for donning of TLSO. Ambulated to sink, then was set up A for hair drying, oral care.  Pt remained seated in w/c, with husband and nurse present for IV connection and with all needs in reach at end of session.  Session 2 Skilled OT  intervention completed with focus on AE education, functional transfers, ambulatory endurance. Pt received seated in w/c, agreeable to session. Unrated pain reported in abdomen intermittently from bruising; declined pharmacological intervention. OT offered rest breaks, repositioning throughout for pain reduction. TLSO already in place.  Pt verbalized that she has sock aid at home but didn't remember how to use, and would like to be able to put on socks and TEDs. Transported dependently in w/c > gym. CGA/min A sit > stand and stand pivot > EOM using RW.   Education provided on donning technique with sock aid for grip socks then personal knee TEDS. Pt was able to donn grip socks with supervision/cueing only with increased time, however needed assist to donn the TED on the sock aid due to decreased [facility] and sensation in fingers, then able to manage over feet and then use figure 4 position to donn rest of way up to calf. Pt requested to practice long handled shoe horn- required increased time and effort for using for donning slip on shoes, but discussed shoes that have more firm back like a tennis shoe may be more beneficial for using the shoe horn. Pt able to achieve figure 4 to donn that way with supervision.   Pt stood CGA using RW, then ambulated 52 ft using RW with CGA with w/c follow for fatigue. Pt mostly limited by weakened grasp on RW handles due to tight grip, RUE shoulder fatigue/discomfort and BLE weakness. Back in room, pt remained seated in w/c with nurse present for assessment of spinal incision, with all needs met at end of session.  Therapy Documentation Precautions:  Precautions Precautions: Fall, Back Recall of Precautions/Restrictions: Intact (Pt able to recall 3/3 spinal precautions.) Precaution/Restrictions Comments: Verbally reviewed spinal precautions, re-educated on acronym BLT. Required Braces or Orthoses: Spinal Brace Spinal Brace: Thoracolumbosacral orthotic, Applied in  sitting position Restrictions Weight Bearing Restrictions Per Provider Order: No    Therapy/Group: Individual Therapy  Katelyn Marcy E Abby Hocking, MS, OTR/L  01/28/2024, 12:12 PM

## 2024-01-29 DIAGNOSIS — R1310 Dysphagia, unspecified: Secondary | ICD-10-CM

## 2024-01-29 DIAGNOSIS — R400 Somnolence: Secondary | ICD-10-CM

## 2024-01-29 DIAGNOSIS — K7460 Unspecified cirrhosis of liver: Secondary | ICD-10-CM

## 2024-01-29 LAB — GLUCOSE, CAPILLARY
Glucose-Capillary: 150 mg/dL — ABNORMAL HIGH (ref 70–99)
Glucose-Capillary: 163 mg/dL — ABNORMAL HIGH (ref 70–99)
Glucose-Capillary: 166 mg/dL — ABNORMAL HIGH (ref 70–99)
Glucose-Capillary: 107 mg/dL — ABNORMAL HIGH (ref 70–99)

## 2024-01-29 MED ORDER — PANTOPRAZOLE SODIUM 40 MG PO TBEC
40.0000 mg | DELAYED_RELEASE_TABLET | Freq: Two times a day (BID) | ORAL | Status: DC
  Administered 2024-01-29 – 2024-02-01 (×6): 40 mg via ORAL
  Filled 2024-01-29 (×6): qty 1

## 2024-01-29 MED ORDER — INSULIN ASPART 100 UNIT/ML IJ SOLN
7.0000 [IU] | Freq: Three times a day (TID) | INTRAMUSCULAR | Status: DC
  Administered 2024-01-29 – 2024-02-01 (×8): 7 [IU] via SUBCUTANEOUS

## 2024-01-29 NOTE — Progress Notes (Signed)
 Physical Therapy Session Note  Patient Details  Name: Katelyn [name] MRN: [id number] Date of Birth: 31-Oct-1955  Today's Date: 01/29/2024 PT Individual Time: 0850-0930 +  1102-1200 PT Individual Time Calculation (min): 40 min + 58 min   Short Term Goals: Week 3: PT Short Term Goal - STG = LTG due to ELOS  Skilled Therapeutic Interventions/Progress Updates:    SESSION 1: Pt presents in room seated in WC, agreeable to PT. Pt reporting mild pain in R shoulder, unrated, states premedicated. Session focused on WC mobility and WC management for navigating narrow spaces and obstacle avoidance to improve independence with household mobility. Pt self propels WC with supervision throughout session from room to day room, provided with education on preferred glove type to improve WC propulsion ability as pt demonstrates poor grip without gloves and with cloth gloves however improved grip and propulsion speed with use of hospital gloves. Pt completes forward navigation slalom through x4 cones with supervision and cues for turning technique and awareness of obstacles more significantly on R side, completes x6 trials, increased time to complete due to fatiguability. Pt then completes retro-propulsion slalom through 2 bolsters placed 2' apart, x4 trials with pt provided with cues for turning technique and sequencing for task. Pt returns to room and remains seated in [facility] with all needs within reach, cal light in place and husband at bedside at end of session.  SESSION 2: Pt presents in day room seated in [facility] with husband [name] at start of session. [name] ATP from [employer] present for session with large part of session devoted to [facility] evaluation and education for home management, body mechanics for propulsion technique, seating position, and pressure relief relative to long term WC use. Pt will benefit from K0005 ultra-lightweight WC to promote independent and safe push-rim biomechanics and positioning for appropriate  mobility indoors to allow increased independence with MRADLs. Pt completes transfers without device with min assist with cues for BLE positioning during transfer. Pt and pt husband also complete transfer training with pt completes x2 stand pivot transfers with RW CGA/min assist, pt husband provided assist with therapist providing cues for hand placement. Pt returns to room and remains seated in [facility] with all needs within reach, cal light in place and pt husband at bedside at end of session.    Therapy Documentation Precautions:  Precautions Precautions: Fall, Back Recall of Precautions/Restrictions: Intact (Pt able to recall 3/3 spinal precautions.) Precaution/Restrictions Comments: Verbally reviewed spinal precautions, re-educated on acronym BLT. Required Braces or Orthoses: Spinal Brace Spinal Brace: Thoracolumbosacral orthotic, Applied in sitting position Restrictions Weight Bearing Restrictions Per Provider Order: No    Therapy/Group: Individual Therapy  Annia Kilts PT, DPT 01/29/2024, 4:50 PM

## 2024-01-29 NOTE — Progress Notes (Signed)
 PROGRESS NOTE   Subjective/Complaints:  Working with therapy this AM. She reports discomfort swallowing, particularly with firmer foods like chicken. Reports had EGD last year, she reports irritated veins were found.  It appears she was seen by GI for cirrhosis. She declines softer diet.     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 + Urge incontinence-chronic, worsened postop +LE edema +swallowing difficulty  Objective:   No results found.   Recent Labs    01/28/24 0512  WBC 4.1  HGB 8.2*  HCT 25.7*  PLT 170    Recent Labs    01/28/24 0512  NA 132*  K 3.8  CL 95*  CO2 28  GLUCOSE 220*  BUN 25*  CREATININE 1.37*  CALCIUM  8.6*       Intake/Output Summary (Last 24 hours) at 01/29/2024 1502 Last data filed at 01/29/2024 0900 Gross per 24 hour  Intake 480 ml  Output --  Net 480 ml        Physical Exam: Vital Signs Blood pressure 136/60, pulse 69, temperature 98.5 F (36.9 C), resp. rate 16, height 5\' 1"  (1.549 m), weight 88 kg, SpO2 98%.    General: awake, alert, appropriate, NAD, working with therapy in the gym HENT: conjugate gaze; oropharynx moist CV: regular rate and rhythm Pulmonary: CTA B/L; non-labored GI: soft, NT, ND, (+)BS Psychiatric: appropriate, cooperative  Neurological: Ox3   Skin: PICC line clean, dry, intact.  Small amount of blood around site.  Stable appearance. Back incision covered by TLSO.   MSK:      No apparent deformity. + RUE shoulder abduction/flexion limited to <70 degrees: Unchanged       +` TLSO Neurologic exam:  Cognition: AAO to person, place, time and event.  Language: Fluent, No substitutions  or neoglisms. No dysarthria.  Memory: No apparent deficits  Insight: Good insight into current condition.  Mood: Pleasant affect, appropriate mood.   Sensation: To light touch reduced in distal fingertips and toes bilaterally; sensation improving at bilateral knees Reflexes: 2+ in BL UE and lower extremities CN: 2-12 grossly intact.  Coordination: No apparent tremors. No ataxia on FTN bilaterally.  Spasticity: MAS 0 in all extremities.       Strength:                RUE: 5/5 SA, 5/5 EF, 5/5 EE, 5/5 WE, 5/5 FF, 5/5 FA                LUE:  5/5 SA, 5/5 EF, 5/5 EE, 5/5 WE, 5/5 FF, 5/5 FA                RLE: 4-/5 HF, 4-/5 KE, 4/5  DF, 4/5  EHL, 4/5  PF                 LLE:  4-/5 HF, 5/5 KE, 5/5  DF, /5  EHL, 5-/5  PF   No changes on exam 4-21  Assessment/Plan: 1.  Functional deficits which require 3+ hours per day of interdisciplinary therapy in a comprehensive inpatient rehab setting. Physiatrist is providing close team supervision and 24 hour management of active medical problems listed below. Physiatrist and rehab team continue to assess barriers to discharge/monitor patient progress toward functional and medical goals  Care Tool:  Bathing    Body parts bathed by patient: Right arm, Chest, Abdomen, Right upper leg, Left upper leg, Face, Left arm, Front perineal area, Buttocks, Right lower leg, Left lower leg   Body parts bathed by helper: Left arm, Front perineal area, Buttocks, Right lower leg, Left lower leg     Bathing assist Assist Level: Supervision/Verbal cueing     Upper Body Dressing/Undressing Upper body dressing   What is the patient wearing?: Pull over shirt    Upper body assist Assist Level: Set up assist    Lower Body Dressing/Undressing Lower body dressing      What is the patient wearing?: Pants, Incontinence brief     Lower body assist Assist for lower body dressing: Minimal Assistance - Patient > 75%     Toileting Toileting    Toileting assist Assist  for toileting: Minimal Assistance - Patient > 75%     Transfers Chair/bed transfer  Transfers assist     Chair/bed transfer assist level: Minimal Assistance - Patient > 75%     Locomotion Ambulation   Ambulation assist   Ambulation activity did not occur: Safety/medical concerns (weakness, pain, decreased balance)  Assist level: 2 helpers Assistive device: Walker-rolling Max distance: 50'   Walk 10 feet activity   Assist  Walk 10 feet activity did not occur: Safety/medical concerns (weakness, pain, decreased balance)  Assist level: 2 helpers Assistive device: Walker-rolling   Walk 50 feet activity   Assist Walk 50 feet with 2 turns activity did not occur: Safety/medical concerns (weakness, pain, decreased balance)         Walk 150 feet activity   Assist Walk 150 feet activity did not occur: Safety/medical concerns (weakness, pain, decreased balance)         Walk 10 feet on uneven surface  activity   Assist Walk 10 feet on uneven surfaces activity did not occur: Safety/medical concerns (weakness, pain, decreased balance)         Wheelchair     Assist Is the patient using a wheelchair?: Yes Type of Wheelchair: Manual    Wheelchair assist level: Supervision/Verbal cueing Max wheelchair distance: 150'    Wheelchair 50 feet with 2 turns activity    Assist        Assist Level: Supervision/Verbal cueing   Wheelchair 150 feet activity     Assist      Assist Level: Supervision/Verbal cueing   Blood pressure 136/60, pulse 69, temperature 98.5 F (36.9 C), resp. rate 16, height 5\' 1"  (1.549 m), weight 88 kg, SpO2 98%.  Medical Problem List and Plan: 1. 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  OK to stop continuous penicillin  G for showers.              -ELOS/Goals: 9-12 days, SPV PT/OT - 4/25 DC date             -con't CIR   - 4/8: Max a bed mobility, STEDY -  very fearful of falling. She did some marching at EOB today with the walker. Min A short distances goals. Mod A UB Max A LBD and toiletting; really limited by pain and ROM in her right arm.     4/15: Confusion has cleared per therapies. Bed mobility Mod A, transfers Mod A with SPT. Walking about 15 feet with Min A. SPV with WC. Husband has been very involved and very helpful. She has some ataxia and endurance difficulty. Setup to Min A with UB care, limited by R shoulder pain. Doing well with reacher.   4/22 team conference note completed   2.  Antithrombotics: -DVT/anticoagulation:  Mechanical: Sequential compression devices, below knee Bilateral lower extremities  -01/13/24 DVT US  neg              -antiplatelet therapy: N/A 3. Acute on chronic pain/Pain Management:  Was on Oxycodone  5 mg and Lyrica  75 mg daily PTA.  --Having surgical pain--continue Oxycodone  prn. Pain worse at nights-->will schedule oxycodone  at bedtime.  -Voltaren  gel QID -4/7: DC morphine  due to no utilization - 4/8: Oxy scheduled overnight and when taken in the a.m. causing some lethargy intermittently--patient has tolerated hydrocodone  in the past, will switch to Norco 2.5 to 5 mg every 6 hours as needed.  Will also schedule Tylenol  500 mg 3 times daily. - 4/9: LFTS up; DC scheduled tylenol . Add oxy to allergy list d/t confusion. Doing much better with Norco - 4/10: Having some a.m. confusion with p.m. Norco.  Has had issues with tramadol in the past.  Patient is already using rarely, advised to continue this regimen and wean off as tolerated. 4-14: No further confusion reported.  DC naproxen  as below. 4/19- still having pain, but feels meds are adequate 4/21  Pregabalin  increased  100mg  TID from 75mg  TID- likely max dose with her renal function  4/22 Pain improved today, continue current 4. Mood/Behavior/Sleep:  LCSW to folow for evaluation and support.              -antipsychotic agents: N/A  -Cymbalta  60mg  nightly, mirapex  1mg  at bedtime, mirtazapine  30mg  nightly  -Patient reports sleeping well  5. Neuropsych/cognition: This patient is capable of making decisions on his own behalf.  4/8: Having increased confusion, staring episodes without new neurologic deficits.  Adjusting pain medications as above.  Has had issues with hallucinations/confusion with cefepime  in the past, would consider antibiotic adjustment if alternative causes ruled out.  4/9: episodes resolved with pain medication changes  6. Skin/Wound Care: Routine pressure relief measures. Monitor incision for healing.   4/7: Neurosurgery removed Hemovac over the weekend, current VAC with no drainage, if no further output in 24 hours will discuss removal with Dr. Philip Bravo removal in 2 days per Dr. Rochelle Chu  Wound VAC removed 4-10- -4-11: Limited visualizations due to TLSO when out of bed, nursing to place picture of wound in chart today.  7. Fluids/Electrolytes/Nutrition: Monitor I/O. Routine labs  -01/12/24 CMP stable today, Cr down to 1.55, AST 44, improving, monitor  4-7: LFTs mildly up trending, BUN and creatinine down, mild hyponatremia.  Limit Tylenol  to 3000 mg daily as above.  Repeat in 2 to 3 days.  4/9: LFTS continue up; NA 131. See below  4/22 LFTS improved, Hx of cirrhosis noted  8. Neuropathy/band like pain around waist: Will increase Lyrica  to TID 9. Osteomyelitis/discitis. Pending PICC placement. -- Starting 6 weeks of daptomycin  and ertapenem  for 6 weeks through 02/20/24 to be followed by PO suppression doxycycline  and ciprofloxacin .  -  01/13/24 Cx NGTD x4d--> 1 of 2 cultures grew cutibacterium acnes--covered with current antibiotics 4-7: ESR added on to a.m. labs.  CRP up today; discussed with ID, no new orders. 4-10: Infectious disease changing ertapenem  to Cipro  due to LFT elevations as below.  Continue daptomycin ; has had  issues with vancomycin  in the past 4-11: Daptomycin  switched to continuous penicillin  G yesterday afternoon.  Continuing Cipro .  Per infectious disease/pharmacy looking into insurance coverage for continuous penicillin  G cassette versus switching to Rocephin  prior to discharge.  If switching to Rocephin , asked to be informed a few days prior so we could trial it at rehab, given significant side effects to cefepime  in the past (more likely related to accumulation with AKI - not true allergy). 4/14: tolerating rocephin  since Friday  10. HTN/edema:  Monitor BP TID--on torsemide  40mg  daily and coreg  6.125mg  BID (not reordered?). -4/5-6/25 BPs mostly fine, coreg  not ordered as of now; remain off for now 4-7: Systolic low, p.o. intakes minimal.  Reduce torsemide  for tomorrow a.m. to 20 mg, and encourage p.o. fluids today. 4-8: Diastolic remains low, will DC torsemide .  Schedule daily weights. 4/9: BP up a bit, continue to hold torsemide   4-10: Increased lower extremity edema, remains with diastolic hypotension but wishes to resume home torsemide ; will resume at 20 mg daily.  Continue teds 4-11: Peripheral edema improved, patient tolerating well.  Continue current medications. -4/12-13/25 BPs great, monitor 4/14: Diastolic soft, but stable. Monitor with therapies.  4/15: Increasing peripheral edema; diastolic remains soft but asymptomatic.  Increase torsemide  back to 40 mg daily, will give extra 20 mg dose today to catch up.  Monitor weights. 4-16: Weights down significantly.  BP tolerating current dose of torsemide .  Continue current medications. 4-18: Weights today likely inaccurate.  Peripheral edema stable.  No shortness of breath.  Vital stable.  Monitor. 4/19- Weight stable from 2 days ago- con't to monitor 4/20- weight 86.3- has been lower than normal- by 2 kg- appears a little lower than expected- will mointor 4/22 BP Controlled, continue leg elevation Vitals:   01/24/24 1943 01/25/24 0501  01/25/24 1358 01/25/24 1936  BP: (!) 131/56 (!) 136/59 (!) 97/58 (!) 118/54   01/26/24 0518 01/26/24 1544 01/27/24 0550 01/27/24 1315  BP: (!) 142/69 (!) 127/59 (!) 141/65 130/65   01/27/24 2015 01/28/24 0517 01/28/24 2034 01/29/24 0455  BP: (!) 124/59 (!) 125/57 (!) 128/59 136/60    Filed Weights   01/28/24 0500 01/28/24 0604 01/29/24 0452  Weight: 92 kg 88.5 kg 88 kg    11. T2DM: Hgb A1C-7.9. Used Tresiba  50-120/ 5 units ac meal TID at home.              --Continue insulin  glargine 20 units daily with SSI.  -4/5-6/25 CBGs a bit better this weekend, monitor trend but may need further adjustments since they're still mostly >200 4/7: Blood sugars remain elevated, increase glargine to 25 units daily starting tomorrow a.m. 4/11: Blood sugars uptrending with improved p.o.; resume home standing short acting insulin , started 3 units 3 times daily WC -4/14: CBG elevated, seems to have been better overall over the weekend so we will monitor for 24 hours and if remains consistently elevated will adjust insulin  4-15: Blood sugars still tending high, with highs to this a.m., increase glargine to 30 units daily-- monitor increase 4-16 4/17: Increase Semglee  to 35 units.  Increase Premeal insulin  to 5 units standing--May need Semglee  further increased over the weekend 4/19- increased Lantus  to 38 units due to  BG's still in low 200's 4/20- CBG this AM was 164- down and much better than prior to change in Lantus  4/21 monitor CBGs today, continue increase lantus  tomorrow  4/22 increase mealtime aspart to 7u  CBG (last 3)  Recent Labs    01/28/24 2059 01/29/24 0630 01/29/24 1210  GLUCAP 321* 150* 163*    12.  Acute on CKD stage III--baseline creatinine 1.4: SCr has had rise to 1.56-->will d/c celebrex .  -01/12/24 Cr 1.55 today, down from 1.86 yesterday, monitor Monday  4-7, 4/9: BUN, creatinine improving. 4-11: BUN stable, creatinine slightly up to 1.26; monitor on Monday, may need to DC naprosyn  if  up trended -01/19/24 Cr up to 1.34, still better than prior and near her baseline; will decrease naprosyn  to 250mg  BID for now, monitor on Monday labs.  4/14, 4/18: Cr stable 1.3-1.4 since resumption of home torsemide . 4/21 BUN/CR stable, continue current regimen   13. Fever blisters: Will start valtrex  1g BID x1wk as symptomatic. Improving, EOT 4/11  14. Right shoulder pain: Question strain but heard a pop this am and extremely concerned that she may have torn her RTC again --Voltaren  gel for local measures.  -- Consider MRI if no improvement 4/8: R shoulder remains very painful/limited - will get x-ray and consider MRI if no acute findings. 4/9: Xray with mild changes; discussed with patient, unable to get MRI due to spinal cord stimulator leads, will order CT 4/10: CT nondiagnostic, some changes "suggestive of rotator cuff tear", mild before meals and glenohumeral degenerative changes.  Consulted orthopedics, appreciate Steffanie Edouard PA-C's evaluation, recommending no intervention at this time, added naproxen  500 mg twice daily. 4-14: Minimal improvements with naproxen , will DC to CKD as above   15. Constipation: Continue Senna 2 tabs daily--miralax  was added on 04/04 -01/12/24 LBM yesterday, didn't get the 32oz miralax  so d/c'd. Continue miralax  17g daily for now.  -01/13/24 LBM last night, cont regimen as is for now, adjust accordingly 4-7: Increase Senokot-S to 2 tabs twice daily, MiraLAX  BID 01/24/24 LBM   4/19- LBM yesterday 4/21 LBM yesterday, monitor 16. Depression/Anxiety: Continue Cymbalta  60mg  nightly and Mirtazapine  30mg  nightly.    17. Neurogenic bladder/urge incontinence: Continue foley. D/c in am if has results with laxative tonight. -01/12/24 had BM last night, will pull foley, do timed toileting and PVRs q4-6h, ISC if PVR >323ml -01/13/24 urinating well, low PVRs, continue checking but could d/c tomorrow if still urinating fine.  4-7: Single episode of incontinence overnight,  with PVR 170s.  Otherwise continent.  Continue current regimen. - 4/16: start ditropan  2.5 mg BID. Q6H bladder scans 4-17: Increase Ditropan  to 5 mg twice daily. 4/18: Bladder scan 0, increase Ditropan  to 5 mg 4 times daily.  No fevers, dysuria or leukocytosis to indicate UTI. 4/19- pt wants to increase- I don't feel that's appropriate considering at the max dose of Oxybutynin  I would do for a 68 yr old 5. ABLA: Hgb 10.5 01/12/24, monitor  -4-7: Hemoglobin down from 10.5-9.4; no overt signs of bleeding, repeat in 1 to 2 days  4-9: Hemoglobin stable, 8.8 -01/20/24 Hgb 8.1 on 4/11 but up to 8.3 on 4/12; monitor tomorrow since still fairly stable.   -4/17 stable  19. GERD: reordered Protonix  40mg  daily -4-10: Increase Protonix  to 40 mg twice daily for GI prophylaxis with antibiotics and now naprosyn  4-14: Will reduce Protonix  back to once daily with DC naproxen  4/22 increase protonix  to BID  20.  Transaminitis.  Uptrending on 4-7 labs.  -May be  stasis related due to poor mobility; Tylenol  utilization within parameters  -Repeat in 2 days  4/9: Increasing. DC standing tylenol . Get liver US  today, + GGT. Per pharmacy medications unlikely contributors.   4-10: Liver ultrasound without acute findings.  Continues to uptrend; discussed with pharmacy, infectious disease, feel this is close enough to her baseline elevations but no concern but will change ertapenem  to Cipro .  4/11:  LFTs have stabilized.  Monitor.  -01/19/24 LFTs fairly stable overall, will change CMP to Monday instead of daily through the weekend. --ALP continues to uptrend, others approximately stable.  - 4/18: LFTs stable/downtrending.  4/22 improved, she has hx of cirrhosis 21. Hyponatremia.   - ?etiology; possible with abx but unlikely   - Holding torsemide  as above   - Add 1200 cc fluid restriciton   - Serum OSM + urine OSM, NA 4/9   - repeat in AM  4-10: Improved today, 132.  Will need to monitor closely with resumption of  torsemide .  Add labs in a.m.  01/19/24 stable 133  4-14, 4/18: stable 22. Sedation/sleepiness  4/20- I think it's Oxybutynin  that's cuasing sedation- I suggest moving to Myrbetriq  since causes less sedation- but don't want to make change without d/w primary physician- will leave to Dr Dorn Gaskins.   -4/22 improved, monitor for now 23. Dysphagia  -PPI increase to BID, SLP consult    LOS: 18 days A FACE TO FACE EVALUATION WAS PERFORMED  Lylia Sand 01/29/2024, 3:02 PM

## 2024-01-29 NOTE — Progress Notes (Signed)
 Patient ID: Katelyn [name], female   DOB: 11-18-1955, 68 y.o.   MRN: [id number]  SW went by pt room to give updates, and pt not present as in therapy. SW will follow-up.   Norval Been, MSW, LCSW Office: [phone] Cell: [phone] Fax: [phone]

## 2024-01-29 NOTE — Progress Notes (Signed)
 Physical Therapy Session Note  Patient Details  Name: POLINA BURMASTER MRN: [id number] Date of Birth: 1956/09/09  Today's Date: 01/29/2024 PT Individual Time: 1033-1100 PT Individual Time Calculation (min): 27 min   Short Term Goals: Week 3:  PT Short Term Goal 1 (Week 3): STG = LTG due to ELOS  Skilled Therapeutic Interventions/Progress Updates:     Pt received supine in bed and agrees to therapy. Pt perform supine to sit from flat bed with bedrails and cues for logrolling and minA at trunk for sidelying to sit. Pt performs stand step from bed>WC>mat with modA and no AD, with cues at trunk for upright posture and sequencing. Session focuses on standing balance and strengthening of legs without use of AD. Pt performs multiple reps of sit to stand with minA and no AD, then works on maintaining standing balance and performing lateral weight shifting. Pt requires minA overall due to posterior bias, with cues for hip extension and anterior weight shifting. Pt performs sidestepping to the Lt and Rt with modA for stability and weight shifting. Stand step back to [facility] with stand step technique and pt taking multiple steps forward without AD, with modA overall. Left seated in dayroom with WC rep and husband.    Therapy Documentation Precautions:  Precautions Precautions: Fall, Back Recall of Precautions/Restrictions: Intact (Pt able to recall 3/3 spinal precautions.) Precaution/Restrictions Comments: Verbally reviewed spinal precautions, re-educated on acronym BLT. Required Braces or Orthoses: Spinal Brace Spinal Brace: Thoracolumbosacral orthotic, Applied in sitting position Restrictions Weight Bearing Restrictions Per Provider Order: No    Therapy/Group: Individual Therapy  Neva Barban, PT, DPT 01/29/2024, 4:35 PM

## 2024-01-29 NOTE — Progress Notes (Signed)
 Physical Therapy Session Note  Patient Details  Name: Katelyn [name] MRN: [id number] Date of Birth: 06/27/1956  Today's Date: 01/29/2024 PT Individual Time: 1405-1500 PT Individual Time Calculation (min): 55 min   Short Term Goals: Week 3:  PT Short Term Goal 1 (Week 3): STG = LTG due to ELOS  Skilled Therapeutic Interventions/Progress Updates: Patient sitting edge of mat with husband present in day room gym. Patient alert and agreeable to PT session.   Patient reported 8/10 pain in R shoulder and mid-back. Pt husband donned Voltaren  to R shoulder with pt reporting decrease in pain during session. Pt conversed about fear of falling, but still wanting to push through therapy to improve. PTA provided active listening and encouragement to pt's progress.  Therapeutic Activity: Bed Mobility: Pt performed sit<supine from EOB with minA and pt recalling VC previously addressed with other therapy sessions. Transfers: Pt performed sit<>stand transfers throughout session with light minA to stand, minA to control descent, and CGA to transfer. Provided VC for hand placement.  - Pt ambulated 30' x 1, and 23' x 1 with seated rest breaks required due to fatigue. Pt with VC to increase upright posture while depressing B shoulders throughout to improve forward flexed posture. Pt with decreased cadence and stride length throughout.  Neuromuscular Re-ed: NMR facilitated during session with focus on proprioceptive feedback and static standing balance. - Static standing balance with weight shifting to R LE (2" block under L LE to bias R WB). Pt performed x 3 up to 30 seconds with cues to shift weight anteriorly. PTA foot behind R foot to avoid pt from favoring WB to L. modA at first that progressed to minA following weight shift cue.  NMR performed for improvements in motor control and coordination, balance, sequencing, judgement, and self confidence/ efficacy in performing all aspects of mobility at highest level  of independence.   Patient supine in bed at end of session with brakes locked, husband present, bed alarm set, and all needs within reach.      Therapy Documentation Precautions:  Precautions Precautions: Fall, Back Recall of Precautions/Restrictions: Intact (Pt able to recall 3/3 spinal precautions.) Precaution/Restrictions Comments: Verbally reviewed spinal precautions, re-educated on acronym BLT. Required Braces or Orthoses: Spinal Brace Spinal Brace: Thoracolumbosacral orthotic, Applied in sitting position Restrictions Weight Bearing Restrictions Per Provider Order: No  Therapy/Group: Individual Therapy  Dariyon Urquilla PTA 01/29/2024, 3:42 PM

## 2024-01-29 NOTE — Progress Notes (Signed)
 Occupational Therapy Session Note  Patient Details  Name: Katelyn [name] MRN: [id number] Date of Birth: 03-25-1956  Today's Date: 01/29/2024 OT Individual Time: 1330-1400 OT Individual Time Calculation (min): 30 min    Short Term Goals: Week 1:  OT Short Term Goal 1 (Week 1): Pt will thread BLE into LB garments with Min A + LRAD. OT Short Term Goal 1 - Progress (Week 1): Met OT Short Term Goal 2 (Week 1): Pt will complete 1/3 toileting activities with Min A + LRAD. OT Short Term Goal 2 - Progress (Week 1): Met OT Short Term Goal 3 (Week 1): Pt will perform toilet transfer with consistent Mod A. OT Short Term Goal 3 - Progress (Week 1): Met Week 2:  OT Short Term Goal 1 (Week 2): Pt will perform hike/lowering of LB garments with Min A + RW. OT Short Term Goal 1 - Progress (Week 2): Met OT Short Term Goal 2 (Week 2): Pt will manage UB care with setup/supervision, incorporating compensatory techniques with Min cuing. OT Short Term Goal 2 - Progress (Week 2): Met OT Short Term Goal 3 (Week 2): Pt will perform LB bathing with consisten Min A + LRAD. OT Short Term Goal 3 - Progress (Week 2): Met  Skilled Therapeutic Interventions/Progress Updates:    1:1 Pt received in the w/c. Pt reported she had just received pain meds. Pt ambulated from her room to the dayroom with min A with RW with one seated rest break. Pt with a scissoring pattern with her feet and with very narrow base of support benefiting from mod cues. Sitting at the EOB focused on sit to stands with use of the RW with mirror feedback to maintain upright posture. Focus with facilitative and verbal cues to maintain balance at midline and not lean on mat with back of her legs. Pt requires min A to maintain balance and has a heavy lean posteriorly and to the left. Challenged her sit to stand with her left leg up on 2 inch block to help force more weight bearing on her right. Pt does required her hands to have a controlled sit on the mat and at  this time requires at least one hand to push up into standing.  Pt does appear to be more unstable on her feet that in eariler session with PT- pt reports it could possibly be from her meds.  Pt left waiting for her next PT session the gym with her husband at her side.   Therapy Documentation Precautions:  Precautions Precautions: Fall, Back Recall of Precautions/Restrictions: Intact (Pt able to recall 3/3 spinal precautions.) Precaution/Restrictions Comments: Verbally reviewed spinal precautions, re-educated on acronym BLT. Required Braces or Orthoses: Spinal Brace Spinal Brace: Thoracolumbosacral orthotic, Applied in sitting position Restrictions Weight Bearing Restrictions Per Provider Order: No  Pain: Pain Assessment Pain Scale: 0-10 Pain Score: 6  Pain Location: Back pt reported receiving meds prior to session.   Therapy/Group: Individual Therapy  [facility] 01/29/2024, 3:47 PM

## 2024-01-30 LAB — GLUCOSE, CAPILLARY
Glucose-Capillary: 224 mg/dL — ABNORMAL HIGH (ref 70–99)
Glucose-Capillary: 95 mg/dL (ref 70–99)
Glucose-Capillary: 143 mg/dL — ABNORMAL HIGH (ref 70–99)
Glucose-Capillary: 171 mg/dL — ABNORMAL HIGH (ref 70–99)

## 2024-01-30 NOTE — Evaluation (Signed)
 Speech Language Pathology Assessment and Plan  Patient Details  Name: Katelyn [name] MRN: [id number] Date of Birth: Jul 10, 1956  SLP Diagnosis: Dysphagia  Rehab Potential: ELOS:     Today's Date: 01/30/2024 SLP Individual Time: 4696-2952 SLP Individual Time Calculation (min): 29 min  Hospital Problem: Principal Problem:   Thoracic myelopathy Active Problems:   Chronic pain syndrome  Past Medical History:  Past Medical History:  Diagnosis Date   Anxiety    Arthritis    Bursitis of right hip    CAD (coronary artery disease)    Cardiac catheterization June 2014 in [place] - 50% circumflex stenosis   Chest pain, neg MI, stable CAD non obstructive on cath 10/05/20 10/04/2020   Chronic diastolic heart failure (HCC) 08/20/2017   Chronic kidney disease, stage 3 (HCC)    does not see nephrologist   Chronic low back pain without sciatica 03/14/2016   Cirrhosis of liver (HCC)    CKD (chronic kidney disease), stage III (HCC) 10/07/2020   Complication of anesthesia    Cough 04/19/2017   Overview:  Last Assessment & Plan:  Formatting of this note may be different from the original. Cough - ? ACE related with AR triggers   Plan  Patient Instructions  Discuss with your primary doctor that lisinopril  pain, need making your cough worse. May use Mucinex  DM twice daily as needed for cough and congestion Zyrtec 10 mg at bedtime as needed for drainage Saline nasal spray as needed. Lab tests today Activity as tolerated. Follow with Dr. Matilde [name] in 3-4 months and As needed   Please contact office for sooner follow up if symptoms do not improve or worsen or seek emergency care    Depression    Dyspnea    with exertion   " lazy lung" - per  Dr Katelyn [name] from back issues- 06/2016   Elevated liver enzymes 12/05/2016   Essential hypertension    GERD (gastroesophageal reflux disease)    Gout 03/14/2016   Greater trochanteric bursitis of right hip 02/02/2012   H/O hiatal hernia    Heart murmur    History of blood  transfusion 2016   History of esophageal stricture 10/07/2020   History of kidney stones    Hypercholesterolemia    Hypertensive heart disease with heart failure (HCC) 01/01/2017   Hypoxia 10/07/2020   Iliotibial band syndrome of right side 02/02/2012   Iron  deficiency anemia due to chronic blood loss 03/14/2016   LBBB (left bundle branch block) 01/01/2017   Left bundle branch block    Leg weakness 10/07/2020   Lumbar stenosis    Meralgia paraesthetica 12/05/2016   Mild CAD 11/24/2015   Morbid obesity (HCC) 10/07/2020   Neuropathy    OSA (obstructive sleep apnea) 05/24/2016   Overview:  Managed PULM- no CPAP   PONV (postoperative nausea and vomiting)    "no N/V with patch"   Restless leg syndrome    S/P lumbar laminectomy 11/26/2015   S/P lumbar spinal fusion 08/29/2016   Tinnitus 12/05/2016   Type 2 diabetes mellitus (HCC)    UTI (urinary tract infection) 10/07/2020   Past Surgical History:  Past Surgical History:  Procedure Laterality Date   ABDOMINAL HYSTERECTOMY  1983   APPENDECTOMY  Age 68   BACK SURGERY     FIRST LUMBAR FUSION/ SURGERY APRIL 2012 AND FUSION WITH INSTRUMENTATION SEPT 2012   CARDIAC CATHETERIZATION     x 2   CARPAL TUNNEL RELEASE     bil   CHOLECYSTECTOMY  1990's   COLONOSCOPY  12/12/2017   Colonic polyp status post polypectomy. Mild sigmoid diverticulosis. Otherwise normal colonoscopy to terminal ileum.   CYSTO EXTRACTION KIDNEY STONES     ESOPHAGOGASTRODUODENOSCOPY  12/12/2017   Small hiatal hernia. Mild gastritis. Status post esophageal dilatation.   EXCISION/RELEASE BURSA HIP  02/02/2012   Procedure: EXCISION/RELEASE BURSA HIP;  Surgeon: Katelyn Hunter, MD;  Location: [facility];  Service: Orthopedics;  Laterality: Right;  Right Hip Bursectomy   EYE SURGERY Bilateral    cataracts   IR THORACIC DISC ASPIRATION W/IMG GUIDE  02/14/2023   KIDNEY STONE SURGERY  2008   LAMINECTOMY WITH POSTERIOR LATERAL ARTHRODESIS LEVEL 3 N/A 01/09/2024   Procedure:  Posterior lateral fusion - Thoracic Seven-Thoracic Eight - Thoracic Eight-Thoracic Nine - Thoracic Nine-Thoracic Ten, Thoracic Ten-Eleven, Thoracic Eleven-Twelve, Thoracic Twelve -Lumbar One, extension of fusion and removal of Thoracic Ten screws;  Surgeon: Katelyn Mulberry, MD;  Location: [facility] OR;  Service: Neurosurgery;  Laterality: N/A;  Posterior lateral fusion - Thoracic Seven-Thora   LAMINECTOMY WITH POSTERIOR LATERAL ARTHRODESIS LEVEL 4 N/A 02/19/2023   Procedure: THORACIC TEN-LUMBAR TWO INSTRUMENTED FUSION;  Surgeon: Katelyn Mar, MD;  Location: [facility] OR;  Service: Neurosurgery;  Laterality: N/A;   Left knee surgery x 2  1996   reconstruction   LUMBAR DISC SURGERY  08/2016   LUMBAR LAMINECTOMY/DECOMPRESSION MICRODISCECTOMY Right 11/26/2015   Procedure: Extraforaminal Microdiscectomy  - Lumbar two-three- right;  Surgeon: Katelyn Mar, MD;  Location: [facility];  Service: Neurosurgery;  Laterality: Right;  right    LUMBAR WOUND DEBRIDEMENT N/A 10/25/2016   Procedure: Lumbar wound revision;  Surgeon: Katelyn Mar, MD;  Location: [facility] OR;  Service: Neurosurgery;  Laterality: N/A;  Lumbar wound revision   Right shoulder surgery  2010   spur   RIGHT/LEFT HEART CATH AND CORONARY ANGIOGRAPHY N/A 10/05/2020   Procedure: RIGHT/LEFT HEART CATH AND CORONARY ANGIOGRAPHY;  Surgeon: [place], Peter M, MD;  Location: [facility] INVASIVE CV LAB;  Service: Cardiovascular;  Laterality: N/A;   SPINAL CORD STIMULATOR REMOVAL  02/19/2023   Procedure: LUMBAR SPINAL CORD STIMULATOR REMOVAL;  Surgeon: Katelyn Mar, MD;  Location: [facility] OR;  Service: Neurosurgery;;    Assessment / Plan / Recommendation Clinical Impression  Katelyn [name] is a 68 year old female with history of CAD, chronic diastolic CHF, HTN, GERD, CKD, anxiety d/o, esophageal stricture, multiple back surgeries with post op serratia infection in 2017, osteo/diskitis  w/spinal cord stimulator in place 11/2022 treated with antibiotics but follow up imaging  showing progressive discitis/oste s/p  medial facetectomy T11-T12 and posterior fixation T12-L2 and removal of [place] stimulator 02/2023 followed by course of antibiotics with transition to po doxy/Cipro . She was admitted on 01/07/24 with progressive LE weakness with inability to walk as well as burning pain in back. She was found to have urinary retention and foley placed.  Myelogram w/CT lumbar spine 01/07/24 showing no change in T9-T10 discitis/osteo, no focal fluid collection but mass effect on thecal sac. She was taken to OR for decompressive thoracic lam with foraminotomies T9-T10, exploration of T10-L2 fusion with removal of loosened T10 pedicle screw and hardware with posterior fixation T7-L1 by Dr. Rochelle Chu on 01/09/24. Therapy has been working with patient who requires max to total assist with ADLs and mobiltiy. She was independent with use of stand up walker prior to a week ago. CIR Recommended due to functional decline.   Swallowing: Pt presents with pharyngoesophageal dysphagia. Oral mechanism exam completed and unremarkable. Pt  has natural dentition, strong cough, and is able to elicit volitional swallow. Pt interview completed with pt reporting history of GERD, hiatal hernias, and esophageal dilations. She reported ongoing globus sensation since November and difficulty consuming tougher meats and occasional instances of regurgitation. Pt also reports self imposed swallowing strategies of alternating solids and liquids, small single boluses, hot beverages and avoidance of certain foods. Bedside Swallow Evaluation completed with thin liquids and regular solids. Pt consumed single and consecutive sips of thin liquid via straw without s/s aspiration. She consumed regular solids reporting globus sensation, observed initiating multiple swallows, and taking sips of liquid during oral phase to propel bolus, Pt states additional time before achieving clearance of bolus from pharynx. She was without s/s aspiration.  Due to upcoming discharge from CIR, ongoing deficits from November, and pt self imposed compensatory strategies, SLP recommending pt follow up with GI provider outpatient for swallowing concerns. SLP also made recommendations for standard swallowing precautions and choosing softer foods.  No further skilled intervention warranted. Pt would benefit from follow up with GI provider.    Skilled Therapeutic Interventions          BSE, informal assessment measures, and OME administered. Please see full report for additional details.     SLP Assessment  Patient does not need any further Speech Lanaguage Pathology Services    Recommendations  SLP Diet Recommendations: Age appropriate regular solids;Thin (advised to choose softer foods with more gravy and/ or sauces) Medication Administration: Whole meds with liquid Supervision: Patient able to self feed Compensations: Minimize environmental distractions;Slow rate;Small sips/bites;Effortful swallow;Follow solids with liquid Postural Changes and/or Swallow Maneuvers: Seated upright 90 degrees;Out of bed for meals Oral Care Recommendations: Oral care BID Patient destination: Home Follow up Recommendations: Other (comment) (GI consult) Equipment Recommended: None recommended by SLP    SLP Frequency     SLP Duration  SLP Intensity  SLP Treatment/Interventions            Pain Pain Assessment Pain Scale: 0-10 Pain Score: 0-No pain Pain Location: Back  Prior Functioning Cognitive/Linguistic Baseline: Information not available Type of Home: House  Lives With: Spouse Available Help at Discharge: Family;Available 24 hours/day Vocation: Retired   Engineer, manufacturing systems Ability to hear (with hearing aid or hearing appliances if normally used Ability to hear (with hearing aid or hearing appliances if normally used): 0. Adequate - no difficulty in normal conservation, social interaction, listening to TV   Expression of Ideas and Wants  Expression of Ideas and Wants: 3. Some difficulty - exhibits some difficulty with expressing needs and ideas (e.g, some words or finishing thoughts) or speech is not clear   Understanding Verbal and Non-Verbal Content Understanding Verbal and Non-Verbal Content: 4. Understands (complex and basic) - clear comprehension without cues or repetitions  Memory/Recall Ability Memory/Recall Ability : That he or she is in a hospital/hospital unit   Bedside Swallowing Assessment General Diet Prior to this Study: Regular;Thin liquids (Level 0) Temperature Spikes Noted: No Respiratory Status: Room air Behavior/Cognition: Alert;Cooperative;Pleasant mood Oral Cavity - Dentition: Adequate natural dentition Self-Feeding Abilities: Able to feed self;Needs set up Patient Positioning: Upright in chair/Tumbleform Baseline Vocal Quality: Normal Volitional Cough: Strong Volitional Swallow: Able to elicit  Oral Care Assessment Oral Assessment  (WDL): Within Defined Limits Teeth: Intact Tongue: Pink;Moist Mucous Membrane(s): Pink;Moist Level of Consciousness: Alert Is patient on any of following O2 devices?: None of the above Nutritional status: Dysphagia Oral Assessment Risk : High Risk Ice Chips Ice chips: Within  functional limits Thin Liquid Thin Liquid: Within functional limits Nectar Thick Nectar Thick Liquid: Not tested Honey Thick Honey Thick Liquid: Not tested Puree Puree: Not tested Solid Solid: Impaired Oral Phase Functional Implications: Prolonged oral transit Pharyngeal Phase Impairments: Multiple swallows Other Comments: suspected esophageal dyspahgia due to report of globus sensation BSE Assessment Suspected Esophageal Findings Suspected Esophageal Findings: Globus sensation Risk for Aspiration Impact on safety and function: Mild aspiration risk Other Related Risk Factors: History of GERD;Lethargy  Short Term Goals: Week 1:    Refer to Care Plan for Long Term  Goals  Recommendations for other services: None   Discharge Criteria: Patient will be discharged from SLP if patient refuses treatment 3 consecutive times without medical reason, if treatment goals not met, if there is a change in medical status, if patient makes no progress towards goals or if patient is discharged from hospital.  The above assessment, treatment plan, treatment alternatives and goals were discussed and mutually agreed upon: by patient  Adela Holter 01/30/2024, 11:58 AM

## 2024-01-30 NOTE — Progress Notes (Signed)
 Physical Therapy Session Note  Patient Details  Name: Katelyn [name] MRN: [id number] Date of Birth: 1955-12-20  Today's Date: 01/30/2024 PT Individual Time: 0950-1103 PT Individual Time Calculation (min): 73 min   Short Term Goals: Week 3:  PT Short Term Goal 1 (Week 3): STG = LTG due to ELOS  Skilled Therapeutic Interventions/Progress Updates:    Pt presents in room seated in WC, nodding off but agreeable to PT. Pt reports having taken pain medicine this morning and states she feels "drunk as a skunk" with pt noted to nod off several times during session and requires more frequent rest breaks. Pt denies pain throughout session. Pt reports having had more pain last night, states has stopped using heatpack at nighttime. Pt educated on use of modalities to decrease pain to decrease need for pharmacological intervention with pt agreeable to try however may need reinforcement as pt frequently falling asleep during session requiring cues to awaken. Pt completes transfers throughout session with CGA with RW including sit<>stand and stand pivot. Pt completes mass practice bed mobility on therapy mat x2 with supervision demonstrating good log roll technique, however requires min assist for BLEs into bed to bed mobility in hospital bed. Pt completes WC mobility with cues for positioning WC prior to transfer and provided with cues for WC leg rest management with cues for sequencing task as well as cues for spinal precuations. Pt completes gait training 44' with increased time, mod cues for step placement and upright posture with pt requiring CGA initially fade to min assist with fatigue. Pt completes NMR for dynamic standing balance and BLE coordination including: - step taps x10 BLE - step ups x5 BLE - marches alternating BLE x20 - squats 2x5 (pt demonstrating bilateral knee buckling on first set requiring min assist to recorrect balance posteriorly into chair, requires max cues for technique as pt demonstrating  excessive knee flexion, no hip flexion, improved stability with cues for "sticking butt out") Pt returns to room and completes transfer back to bed with CGA. Pt remains supine in bed with all needs within reach, cal light in place and bed alarm activated at end of session.   Therapy Documentation Precautions:  Precautions Precautions: Fall, Back Recall of Precautions/Restrictions: Intact (Pt able to recall 3/3 spinal precautions.) Precaution/Restrictions Comments: Verbally reviewed spinal precautions, re-educated on acronym BLT. Required Braces or Orthoses: Spinal Brace Spinal Brace: Thoracolumbosacral orthotic, Applied in sitting position Restrictions Weight Bearing Restrictions Per Provider Order: No   Therapy/Group: Individual Therapy  Annia Kilts PT, DPT 01/30/2024, 1:56 PM

## 2024-01-30 NOTE — Progress Notes (Signed)
 PROGRESS NOTE   Subjective/Complaints:  Pt reports she feels tired today after tramadol. She says oxycodone  and hydrocodone  did not cause her to feel tired in the past. It does not appear she got any tramadol, AM medication was hydrocodone .     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 + Urge incontinence-chronic, worsened postop- now improved  +LE edema +swallowing difficulty- chronic   Objective:   No results found.   Recent Labs    01/28/24 0512  WBC 4.1  HGB 8.2*  HCT 25.7*  PLT 170    Recent Labs    01/28/24 0512  NA 132*  K 3.8  CL 95*  CO2 28  GLUCOSE 220*  BUN 25*  CREATININE 1.37*  CALCIUM  8.6*       Intake/Output Summary (Last 24 hours) at 01/30/2024 1144 Last data filed at 01/30/2024 1610 Gross per 24 hour  Intake 720 ml  Output --  Net 720 ml        Physical Exam: Vital Signs Blood pressure (!) 152/68, pulse 79, temperature 98.5 F (36.9 C), temperature source Oral, resp. rate 14, height 5\' 1"  (1.549 m), weight 87.7 kg, SpO2 100%.    General: awake, alert, appropriate, NAD, sitting in WC in her room  HENT: conjugate gaze; oropharynx moist CV: regular rate and rhythm Pulmonary: CTA B/L; non-labored, on RA GI: soft, NT, ND, (+)BS Psychiatric: appropriate, cooperative  Neurological: Ox3   Skin: PICC line clean, dry, intact.  Small amount of blood around site.  Stable appearance. Back incision covered by TLSO.   MSK:      No apparent deformity. + RUE shoulder abduction/flexion limited to <70 degrees: Unchanged       +`TLSO Neurologic exam: Alert and oriented x 4, follows commands, no dysarthria.  Possibly memory  deficits in recall of her medications. Sensation: To light touch reduced in distal fingertips and toes bilaterally; sensation improving at bilateral knees  CN: 2-12 grossly intact.  No hypertonia      Strength:                RUE: 5/5 SA, 5/5 EF, 5/5 EE, 5/5 WE, 5/5 FF, 5/5 FA                LUE:  5/5 SA, 5/5 EF, 5/5 EE, 5/5 WE, 5/5 FF, 5/5 FA                RLE: 4-/5 HF, 4-/5 KE, 4/5  DF, 4/5  EHL, 4/5  PF                 LLE:  4-/5 HF, 5/5 KE, 5/5  DF, /5  EHL, 5-/5  PF   No changes on exam 4-23  Assessment/Plan: 1. Functional deficits which require 3+ hours per day of interdisciplinary therapy in a comprehensive inpatient rehab setting. Physiatrist is providing close team supervision and 24 hour management of active medical problems listed below. Physiatrist and rehab team continue  to assess barriers to discharge/monitor patient progress toward functional and medical goals  Care Tool:  Bathing    Body parts bathed by patient: Right arm, Chest, Abdomen, Right upper leg, Left upper leg, Face, Left arm, Front perineal area, Buttocks, Right lower leg, Left lower leg   Body parts bathed by helper: Left arm, Front perineal area, Buttocks, Right lower leg, Left lower leg     Bathing assist Assist Level: Supervision/Verbal cueing     Upper Body Dressing/Undressing Upper body dressing   What is the patient wearing?: Pull over shirt    Upper body assist Assist Level: Set up assist    Lower Body Dressing/Undressing Lower body dressing      What is the patient wearing?: Pants, Incontinence brief     Lower body assist Assist for lower body dressing: Minimal Assistance - Patient > 75%     Toileting Toileting    Toileting assist Assist for toileting: Minimal Assistance - Patient > 75%     Transfers Chair/bed transfer  Transfers assist     Chair/bed transfer assist level: Moderate Assistance - Patient 50 - 74%     Locomotion Ambulation   Ambulation assist    Ambulation activity did not occur: Safety/medical concerns (weakness, pain, decreased balance)  Assist level: 2 helpers Assistive device: Walker-rolling Max distance: 50'   Walk 10 feet activity   Assist  Walk 10 feet activity did not occur: Safety/medical concerns (weakness, pain, decreased balance)  Assist level: 2 helpers Assistive device: Walker-rolling   Walk 50 feet activity   Assist Walk 50 feet with 2 turns activity did not occur: Safety/medical concerns (weakness, pain, decreased balance)         Walk 150 feet activity   Assist Walk 150 feet activity did not occur: Safety/medical concerns (weakness, pain, decreased balance)         Walk 10 feet on uneven surface  activity   Assist Walk 10 feet on uneven surfaces activity did not occur: Safety/medical concerns (weakness, pain, decreased balance)         Wheelchair     Assist Is the patient using a wheelchair?: Yes Type of Wheelchair: Manual    Wheelchair assist level: Supervision/Verbal cueing Max wheelchair distance: 150'    Wheelchair 50 feet with 2 turns activity    Assist        Assist Level: Supervision/Verbal cueing   Wheelchair 150 feet activity     Assist      Assist Level: Supervision/Verbal cueing   Blood pressure (!) 152/68, pulse 79, temperature 98.5 F (36.9 C), temperature source Oral, resp. rate 14, height 5\' 1"  (1.549 m), weight 87.7 kg, SpO2 100%.  Medical Problem List and Plan: 1. 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  OK to stop continuous penicillin  G for showers.             -ELOS/Goals: 9-12 days, SPV PT/OT - 4/25 DC date             -con't CIR   - 4/8: Max a bed mobility, STEDY - very fearful of falling. She did some marching at EOB today with the walker. Min A short  distances goals. Mod A UB Max A LBD and toiletting; really limited by pain and ROM  in her right arm.     4/15: Confusion has cleared per therapies. Bed mobility Mod A, transfers Mod A with SPT. Walking about 15 feet with Min A. SPV with WC. Husband has been very involved and very helpful. She has some ataxia and endurance difficulty. Setup to Min A with UB care, limited by R shoulder pain. Doing well with reacher.   Expected discharge still 4/25   2.  Antithrombotics: -DVT/anticoagulation:  Mechanical: Sequential compression devices, below knee Bilateral lower extremities  -01/13/24 DVT US  neg              -antiplatelet therapy: N/A 3. Acute on chronic pain/Pain Management:  Was on Oxycodone  5 mg and Lyrica  75 mg daily PTA.  --Having surgical pain--continue Oxycodone  prn. Pain worse at nights-->will schedule oxycodone  at bedtime.  -Voltaren  gel QID -4/7: DC morphine  due to no utilization - 4/8: Oxy scheduled overnight and when taken in the a.m. causing some lethargy intermittently--patient has tolerated hydrocodone  in the past, will switch to Norco 2.5 to 5 mg every 6 hours as needed.  Will also schedule Tylenol  500 mg 3 times daily. - 4/9: LFTS up; DC scheduled tylenol . Add oxy to allergy list d/t confusion. Doing much better with Norco - 4/10: Having some a.m. confusion with p.m. Norco.  Has had issues with tramadol in the past.  Patient is already using rarely, advised to continue this regimen and wean off as tolerated. 4-14: No further confusion reported.  DC naproxen  as below. 4/19- still having pain, but feels meds are adequate 4/21  Pregabalin  increased  100mg  TID from 75mg  TID- likely max dose with her renal function  4/23 PDMP review indicates she takes oxycodone  at home 4. Mood/Behavior/Sleep: LCSW to folow for evaluation and support.              -antipsychotic agents: N/A  -Cymbalta  60mg  nightly, mirapex  1mg  at bedtime, mirtazapine  30mg  nightly  -Patient reports sleeping well  5.  Neuropsych/cognition: This patient is capable of making decisions on his own behalf.  4/8: Having increased confusion, staring episodes without new neurologic deficits.  Adjusting pain medications as above.  Has had issues with hallucinations/confusion with cefepime  in the past, would consider antibiotic adjustment if alternative causes ruled out.  4/9: episodes resolved with pain medication changes  6. Skin/Wound Care: Routine pressure relief measures. Monitor incision for healing.   4/7: Neurosurgery removed Hemovac over the weekend, current VAC with no drainage, if no further output in 24 hours will discuss removal with Dr. Philip Bravo removal in 2 days per Dr. Rochelle Chu  Wound VAC removed 4-10- -4-11: Limited visualizations due to TLSO when out of bed, nursing to place picture of wound in chart today.  7. Fluids/Electrolytes/Nutrition: Monitor I/O. Routine labs  -01/12/24 CMP stable today, Cr down to 1.55, AST 44, improving, monitor  4-7: LFTs mildly up trending, BUN and creatinine down, mild hyponatremia.  Limit Tylenol  to 3000 mg daily as above.  Repeat in 2 to 3 days.  4/9: LFTS continue up; NA 131. See below  4/22 LFTS improved, Hx of cirrhosis noted in chart review   8. Neuropathy/band like pain around waist: Will increase Lyrica  to TID 9. Osteomyelitis/discitis. Pending PICC placement. -- Starting 6 weeks of daptomycin  and ertapenem  for 6 weeks through 02/20/24 to be followed by PO suppression doxycycline  and ciprofloxacin .  -01/13/24 Cx NGTD x4d--> 1 of 2 cultures grew cutibacterium acnes--covered with current antibiotics 4-7: ESR added on to a.m. labs.  CRP up today; discussed with  ID, no new orders. 4-10: Infectious disease changing ertapenem  to Cipro  due to LFT elevations as below.  Continue daptomycin ; has had issues with vancomycin  in the past 4-11: Daptomycin  switched to continuous penicillin  G yesterday afternoon.  Continuing Cipro .  Per infectious disease/pharmacy looking into insurance  coverage for continuous penicillin  G cassette versus switching to Rocephin  prior to discharge.  If switching to Rocephin , asked to be informed a few days prior so we could trial it at rehab, given significant side effects to cefepime  in the past (more likely related to accumulation with AKI - not true allergy). 4/14: tolerating rocephin  since Friday  10. HTN/edema:  Monitor BP TID--on torsemide  40mg  daily and coreg  6.125mg  BID (not reordered?). -4/5-6/25 BPs mostly fine, coreg  not ordered as of now; remain off for now 4-7: Systolic low, p.o. intakes minimal.  Reduce torsemide  for tomorrow a.m. to 20 mg, and encourage p.o. fluids today. 4-8: Diastolic remains low, will DC torsemide .  Schedule daily weights. 4/9: BP up a bit, continue to hold torsemide   4-10: Increased lower extremity edema, remains with diastolic hypotension but wishes to resume home torsemide ; will resume at 20 mg daily.  Continue teds 4-11: Peripheral edema improved, patient tolerating well.  Continue current medications. -4/12-13/25 BPs great, monitor 4/14: Diastolic soft, but stable. Monitor with therapies.  4/15: Increasing peripheral edema; diastolic remains soft but asymptomatic.  Increase torsemide  back to 40 mg daily, will give extra 20 mg dose today to catch up.  Monitor weights. 4-16: Weights down significantly.  BP tolerating current dose of torsemide .  Continue current medications. 4-18: Weights today likely inaccurate.  Peripheral edema stable.  No shortness of breath.  Vital stable.  Monitor. 4/19- Weight stable from 2 days ago- con't to monitor 4/20- weight 86.3- has been lower than normal- by 2 kg- appears a little lower than expected- will mointor 4/23 BP overall controlled, continue current regimen, wt trending down a little  Vitals:   01/25/24 1936 01/26/24 0518 01/26/24 1544 01/27/24 0550  BP: (!) 118/54 (!) 142/69 (!) 127/59 (!) 141/65   01/27/24 1315 01/27/24 2015 01/28/24 0517 01/28/24 2034  BP: 130/65  (!) 124/59 (!) 125/57 (!) 128/59   01/29/24 0455 01/29/24 1507 01/29/24 2003 01/30/24 0438  BP: 136/60 (!) 124/54 (!) 137/58 (!) 152/68    Filed Weights   01/28/24 0604 01/29/24 0452 01/30/24 0438  Weight: 88.5 kg 88 kg 87.7 kg    11. T2DM: Hgb A1C-7.9. Used Tresiba  50-120/ 5 units ac meal TID at home.              --Continue insulin  glargine 20 units daily with SSI.  -4/5-6/25 CBGs a bit better this weekend, monitor trend but may need further adjustments since they're still mostly >200 4/7: Blood sugars remain elevated, increase glargine to 25 units daily starting tomorrow a.m. 4/11: Blood sugars uptrending with improved p.o.; resume home standing short acting insulin , started 3 units 3 times daily WC -4/14: CBG elevated, seems to have been better overall over the weekend so we will monitor for 24 hours and if remains consistently elevated will adjust insulin  4-15: Blood sugars still tending high, with highs to this a.m., increase glargine to 30 units daily-- monitor increase 4-16 4/17: Increase Semglee  to 35 units.  Increase Premeal insulin  to 5 units standing--May need Semglee  further increased over the weekend 4/19- increased Lantus  to 38 units due to BG's still in low 200's 4/20- CBG this AM was 164- down and much better than prior to change in  Lantus  4/21 monitor CBGs today, continue increase lantus  tomorrow  4/22 increase mealtime aspart to 7u  4/23 cbgs better controlled, continue current  CBG (last 3)  Recent Labs    01/29/24 1658 01/29/24 2046 01/30/24 0801  GLUCAP 166* 107* 224*    12.  Acute on CKD stage III--baseline creatinine 1.4: SCr has had rise to 1.56-->will d/c celebrex .  -01/12/24 Cr 1.55 today, down from 1.86 yesterday, monitor Monday  4-7, 4/9: BUN, creatinine improving. 4-11: BUN stable, creatinine slightly up to 1.26; monitor on Monday, may need to DC naprosyn  if up trended -01/19/24 Cr up to 1.34, still better than prior and near her baseline; will decrease  naprosyn  to 250mg  BID for now, monitor on Monday labs.  4/14, 4/18: Cr stable 1.3-1.4 since resumption of home torsemide . 4/21 BUN/CR stable, continue current regimen   13. Fever blisters: Will start valtrex  1g BID x1wk as symptomatic. Improving, EOT 4/11  14. Right shoulder pain: Question strain but heard a pop this am and extremely concerned that she may have torn her RTC again --Voltaren  gel for local measures.  -- Consider MRI if no improvement 4/8: R shoulder remains very painful/limited - will get x-ray and consider MRI if no acute findings. 4/9: Xray with mild changes; discussed with patient, unable to get MRI due to spinal cord stimulator leads, will order CT 4/10: CT nondiagnostic, some changes "suggestive of rotator cuff tear", mild before meals and glenohumeral degenerative changes.  Consulted orthopedics, appreciate Steffanie Edouard PA-C's evaluation, recommending no intervention at this time, added naproxen  500 mg twice daily. 4-14: Minimal improvements with naproxen , will DC to CKD as above   15. Constipation: Continue Senna 2 tabs daily--miralax  was added on 04/04 -01/12/24 LBM yesterday, didn't get the 32oz miralax  so d/c'd. Continue miralax  17g daily for now.  -01/13/24 LBM last night, cont regimen as is for now, adjust accordingly 4-7: Increase Senokot-S to 2 tabs twice daily, MiraLAX  BID 01/24/24 LBM   4/19- LBM yesterday 4/23 LBM today, continue current regimen  16. Depression/Anxiety: Continue Cymbalta  60mg  nightly and Mirtazapine  30mg  nightly.    17. Neurogenic bladder/urge incontinence: Continue foley. D/c in am if has results with laxative tonight. -01/12/24 had BM last night, will pull foley, do timed toileting and PVRs q4-6h, ISC if PVR >329ml -01/13/24 urinating well, low PVRs, continue checking but could d/c tomorrow if still urinating fine.  4-7: Single episode of incontinence overnight, with PVR 170s.  Otherwise continent.  Continue current regimen. - 4/16: start  ditropan  2.5 mg BID. Q6H bladder scans 4-17: Increase Ditropan  to 5 mg twice daily. 4/18: Bladder scan 0, increase Ditropan  to 5 mg 4 times daily.  No fevers, dysuria or leukocytosis to indicate UTI. 4/19- pt wants to increase- I don't feel that's appropriate considering at the max dose of Oxybutynin  I would do for a 68 yr old 4/23 Pt reports this is doing better, continent 18. ABLA: Hgb 10.5 01/12/24, monitor  -4-7: Hemoglobin down from 10.5-9.4; no overt signs of bleeding, repeat in 1 to 2 days  4-9: Hemoglobin stable, 8.8 -01/20/24 Hgb 8.1 on 4/11 but up to 8.3 on 4/12; monitor tomorrow since still fairly stable.   -4/17 stable  19. GERD: reordered Protonix  40mg  daily -4-10: Increase Protonix  to 40 mg twice daily for GI prophylaxis with antibiotics and now naprosyn  4-14: Will reduce Protonix  back to once daily with DC naproxen  4/22 increase protonix  to BID  20.  Transaminitis.  Uptrending on 4-7 labs.  -May be stasis related  due to poor mobility; Tylenol  utilization within parameters  -Repeat in 2 days  4/9: Increasing. DC standing tylenol . Get liver US  today, + GGT. Per pharmacy medications unlikely contributors.   4-10: Liver ultrasound without acute findings.  Continues to uptrend; discussed with pharmacy, infectious disease, feel this is close enough to her baseline elevations but no concern but will change ertapenem  to Cipro .  4/11:  LFTs have stabilized.  Monitor.  -01/19/24 LFTs fairly stable overall, will change CMP to Monday instead of daily through the weekend. --ALP continues to uptrend, others approximately stable.  - 4/18: LFTs stable/downtrending.  4/22 improved, she has hx of cirrhosis 21. Hyponatremia.   - ?etiology; possible with abx but unlikely   - Holding torsemide  as above   - Add 1200 cc fluid restriciton   - Serum OSM + urine OSM, NA 4/9   - repeat in AM  4-10: Improved today, 132.  Will need to monitor closely with resumption of torsemide .  Add labs in  a.m.  01/19/24 stable 133  4-14, 4/18: stable 22. Sedation/sleepiness  4/20- I think it's Oxybutynin  that's cuasing sedation- I suggest moving to Myrbetriq  since causes less sedation- but don't want to make change without d/w primary physician- will leave to Dr Dorn Gaskins.   -4/23 Consider trying myrbetriq   23. Dysphagia  -PPI increase to BID, SLP consult    LOS: 19 days A FACE TO FACE EVALUATION WAS PERFORMED  Lylia Sand 01/30/2024, 11:44 AM

## 2024-01-30 NOTE — Patient Care Conference (Signed)
 Inpatient RehabilitationTeam Conference and Plan of Care Update Date: 01/29/2024   Time: 1039 am    Patient Name: Katelyn [name]      Medical Record Number: [id number]  Date of Birth: Aug 28, 1956 Sex: Female         Room/Bed: 9G29B/2W41L-24 Payor Info: Payor: Vincente Green ADVANTAGE / Plan: Gwendel Lemme PPO / Product Type: *No Product type* /    Admit Date/Time:  01/11/2024  6:53 PM  Primary Diagnosis:  Thoracic myelopathy  Hospital Problems: Principal Problem:   Thoracic myelopathy Active Problems:   Chronic pain syndrome    Expected Discharge Date: Expected Discharge Date: 02/01/24  Team Members Present: Physician leading conference: Dr. Abelino Able Social Worker Present: Norval Been, LCSWA Nurse Present: Jerene Monks, RN PT Present: Crist Dominion, PT OT Present: Artemus Biles, OT SLP Present: Jenney Modest, SLP     Current Status/Progress Goal Weekly Team Focus  Bowel/Bladder   Continent x2. Lasat BM 4/20.   Patient to remain continent.   Assess skin q shift and prn.    Swallow/Nutrition/ Hydration               ADL's   Setup/supervision for UB ADLs (intermeidate Min A for TLSO management). Min A for LB ADLs & toileting due to decreased posterior reach in standing and standing balance. Barriers: Decreased standing balance with ADLs, and difficulty adhering to back precautions when fatigued/rushed.   Upgraded transfer goals to CGA (with exeption of shower transfer). Min A for remained of ADL participation.   Unilateral support on RW for LB care; continued AE training, caregiver education, and discharge planning.    Mobility   bed mobility mod assist, transfers min assist with RW, gait 35' min assist with RW, WC mobility supervision   min assist, supervision WC  barriers: strength and endurance; focus on transfer trianing, WC mobility, endurance, NMR, gait    Communication                Safety/Cognition/ Behavioral Observations                Pain   Pain to lower back area. Prn vicodin given with relief.   Pain less than or equal to 2.   Assess pain q shift and prn.    Skin   CDI.Healed surgical incision to back.   No skin breakdown.  Assess skin q shift and prn.      Discharge Planning:  Pt will dc/ to home with her husband who will be primary cargeiver. Pt needs to be as independent as possible as he is not physically able to assist. IV abx until 5/4. HHA- [facility] and [facility] for abx. SW will confirm there are no barriers to discharge.    Team Discussion: Patient was admitted post T7-L2 fusion and replacement of T10, L1, and T12 screws due to T10 spinal stenosis with myelopathy. Patient has osteomyelitis /discitis on IV antibiotics.Patient limited by anxiety, right shoulder pain, fatigue and decreased strength of the lower extremities.   Patient on target to meet rehab goals: yes, Patient requires Setup/supervision for UB ADLs , Min A for LB ADLs & minimal assistance with transfers using a RW. Patient  is able to ambulate up to  60' with  min assist using a RW. Overall goals are set for minimal assistance-supervision at discharge.   *See Care Plan and progress notes for long and short-term goals.   Revisions to Treatment Plan:  Reacher   Teaching Needs: Safety, medications, dietary  recommendations, TLSO management, transfers, toileting, etc    Current Barriers to Discharge: Decreased caregiver support and IV antibiotics, Neurogenic bowel and bladder  Possible Resolutions to Barriers: Family Education Home health follow-up DME: shower chair, W/C, RW     Medical Summary Current Status: T10 spinal stenosis with myelopathy, IV ABX, HTN, DM2, constipation, GERD  Barriers to Discharge: Medical stability;Self-care education;Electrolyte abnormality;Renal Insufficiency/Failure;Neurogenic Bowel & Bladder  Barriers to Discharge Comments: T10 spinal stenosis with myelopathy, IV ABX, HTN, DM2,  constipation, GERD Possible Resolutions to [employer] Focus: monitor bowel funciton,  increase PPI frequency, monitor BMP, increase lantus    Continued Need for Acute Rehabilitation Level of Care: The patient requires daily medical management by a physician with specialized training in physical medicine and rehabilitation for the following reasons: Direction of a multidisciplinary physical rehabilitation program to maximize functional independence : Yes Medical management of patient stability for increased activity during participation in an intensive rehabilitation regime.: Yes Analysis of laboratory values and/or radiology reports with any subsequent need for medication adjustment and/or medical intervention. : Yes   I attest that I was present, lead the team conference, and concur with the assessment and plan of the team.   Jerene Monks 01/29/2024, 1039 am

## 2024-01-30 NOTE — Progress Notes (Signed)
 Patient ID: Katelyn [name], female   DOB: 1956/08/10, 68 y.o.   MRN: [id number]  SW went by pt room to give updates as pt was sleep. SW will follow-up.     Norval Been, MSW, LCSW Office: [phone] Cell: [phone] Fax: [phone]

## 2024-01-30 NOTE — Progress Notes (Signed)
 Occupational Therapy Session Note  Patient Details  Name: BIANKA LIBERATI MRN: [id number] Date of Birth: Feb 09, 1956  Today's Date: 01/30/2024 OT Individual Time: 4098-1191 OT Individual Time Calculation (min): 50 min   Today's Date: 01/30/2024 OT Individual Time: 4782-9562 OT Individual Time Calculation (min): 70 min   Short Term Goals: Week 3:  OT Short Term Goal 1 (Week 3): STGs=LTGs due to patient's estimated length of stay.  Skilled Therapeutic Interventions/Progress Updates:   Session 1:  Pt received sitting in WC, LPN present in room for morning medications, and 5/10 surgical pain. Pt completes sink-side UB bathing with setup/supervision, A provided for management of material and PICC line. Pt manages BLE unthreading/threading with use of reacher, min cuing for most efficient use of reacher. Standing lower/hike of garments over bottom/hips with light Min A, Min A for standing balance + RW. Pt uses long-handled sponge for LB bathing, cuing to avoid forward fold, assistance provided for posterior pericare in standing. Pt re-educated on use of sock-aid, requiring Min A + Mod cuing to use AE. Pt requires Max A for footwear management. Pt continues to demo questionable cognition post medication administration, requiring increased cuing for safety and for alertness. Pt remained sitting in WC, all immediate needs met, door open.   Session 2:  Pt received finishing lunch at bed level, no complaints of pain. Loaner WC arrived/fitted during session by [employer]. Pt comes to EOB with Min A, stand-step transfer from EOB>WC with CGA + RW. Pt performs stand-pivot from WC<>toilet with Min A, pt requires Mod A for toileting due to urgency, performing pericare in seated with front>back technique. Pt propels loaner WC to most therapy locations with supervision and verbal cuing for mechanics to avoid overuse of B shoulders. In ortho gym, pt performs walk-in shower transfer with Min A + cuing for  sequencing of technique. Pt and OT review basics of pressure relief in manual chair, including alternating seated surfaces (I.e EOB, couch, recliner, etc). Pt able to demo lateral lean technique with unilateral buttock clearance, as she is unable perform "WC pushups." Increased drowsiness noted throughout session, continue to anticipate due to medications. Plan to communicate to MD. Pt remained resting in bed with all immediate needs met.   Therapy Documentation Precautions:  Precautions Precautions: Fall, Back Recall of Precautions/Restrictions: Intact (Pt able to recall 3/3 spinal precautions.) Precaution/Restrictions Comments: Verbally reviewed spinal precautions, re-educated on acronym BLT. Required Braces or Orthoses: Spinal Brace Spinal Brace: Thoracolumbosacral orthotic, Applied in sitting position Restrictions Weight Bearing Restrictions Per Provider Order: No   Therapy/Group: Individual Therapy  Artemus Biles, OTR/L, MSOT  01/30/2024, 6:17 AM

## 2024-01-31 ENCOUNTER — Encounter: Payer: Self-pay | Admitting: Cardiology

## 2024-01-31 ENCOUNTER — Other Ambulatory Visit (HOSPITAL_COMMUNITY): Payer: Self-pay

## 2024-01-31 DIAGNOSIS — R7401 Elevation of levels of liver transaminase levels: Secondary | ICD-10-CM

## 2024-01-31 LAB — CBC
HCT: 25.6 % — ABNORMAL LOW (ref 36.0–46.0)
Hemoglobin: 8.6 g/dL — ABNORMAL LOW (ref 12.0–15.0)
MCH: 29.7 pg (ref 26.0–34.0)
MCHC: 33.6 g/dL (ref 30.0–36.0)
MCV: 88.3 fL (ref 80.0–100.0)
Platelets: 169 10*3/uL (ref 150–400)
RBC: 2.9 MIL/uL — ABNORMAL LOW (ref 3.87–5.11)
RDW: 14.1 % (ref 11.5–15.5)
WBC: 4 10*3/uL (ref 4.0–10.5)
nRBC: 0 % (ref 0.0–0.2)

## 2024-01-31 LAB — GLUCOSE, CAPILLARY
Glucose-Capillary: 192 mg/dL — ABNORMAL HIGH (ref 70–99)
Glucose-Capillary: 212 mg/dL — ABNORMAL HIGH (ref 70–99)
Glucose-Capillary: 200 mg/dL — ABNORMAL HIGH (ref 70–99)
Glucose-Capillary: 294 mg/dL — ABNORMAL HIGH (ref 70–99)

## 2024-01-31 MED ORDER — PREGABALIN 75 MG PO CAPS
75.0000 mg | ORAL_CAPSULE | Freq: Three times a day (TID) | ORAL | Status: DC
  Administered 2024-01-31 – 2024-02-01 (×2): 75 mg via ORAL
  Filled 2024-01-31 (×2): qty 1

## 2024-01-31 MED ORDER — INSULIN GLARGINE-YFGN 100 UNIT/ML ~~LOC~~ SOLN
40.0000 [IU] | Freq: Every day | SUBCUTANEOUS | Status: DC
  Administered 2024-02-01: 40 [IU] via SUBCUTANEOUS
  Filled 2024-01-31: qty 0.4

## 2024-01-31 MED ORDER — MELATONIN 5 MG PO TABS
5.0000 mg | ORAL_TABLET | Freq: Every evening | ORAL | 0 refills | Status: AC | PRN
Start: 2024-01-31 — End: ?
  Filled 2024-01-31: qty 30, 30d supply, fill #0

## 2024-01-31 MED ORDER — SENNOSIDES-DOCUSATE SODIUM 8.6-50 MG PO TABS
2.0000 | ORAL_TABLET | Freq: Two times a day (BID) | ORAL | 0 refills | Status: DC
  Filled 2024-01-31: qty 120, 30d supply, fill #0

## 2024-01-31 MED ORDER — CIPROFLOXACIN HCL 500 MG PO TABS
500.0000 mg | ORAL_TABLET | Freq: Two times a day (BID) | ORAL | 0 refills | Status: DC
  Filled 2024-01-31: qty 60, 30d supply, fill #0

## 2024-01-31 MED ORDER — METHOCARBAMOL 500 MG PO TABS
500.0000 mg | ORAL_TABLET | Freq: Four times a day (QID) | ORAL | 0 refills | Status: DC | PRN
  Filled 2024-01-31: qty 45, 12d supply, fill #0

## 2024-01-31 MED ORDER — CEFTRIAXONE IV (FOR PTA / DISCHARGE USE ONLY): 2.0000 g | INTRAVENOUS | 0 refills | Status: AC

## 2024-01-31 MED ORDER — DICLOFENAC SODIUM 1 % EX GEL
2.0000 g | Freq: Four times a day (QID) | CUTANEOUS | 0 refills | Status: DC
  Filled 2024-01-31: qty 200, 25d supply, fill #0

## 2024-01-31 MED ORDER — HYDROCODONE-ACETAMINOPHEN 5-325 MG PO TABS
0.5000 | ORAL_TABLET | Freq: Four times a day (QID) | ORAL | 0 refills | Status: DC | PRN
  Filled 2024-01-31: qty 28, 7d supply, fill #0

## 2024-01-31 MED ORDER — SODIUM CHLORIDE 0.9 % IV SOLN: 2.0000 g | Freq: Every day | INTRAVENOUS | Status: DC

## 2024-01-31 MED ORDER — PREGABALIN 75 MG PO CAPS
75.0000 mg | ORAL_CAPSULE | Freq: Three times a day (TID) | ORAL | 0 refills | Status: DC
  Filled 2024-01-31: qty 90, 30d supply, fill #0

## 2024-01-31 MED ORDER — MIRABEGRON ER 25 MG PO TB24
25.0000 mg | ORAL_TABLET | Freq: Every day | ORAL | 0 refills | Status: DC
  Filled 2024-01-31: qty 30, 30d supply, fill #0

## 2024-01-31 MED ORDER — POLYETHYLENE GLYCOL 3350 17 GM/SCOOP PO POWD
17.0000 g | Freq: Every day | ORAL | 0 refills | Status: AC
  Filled 2024-01-31: qty 238, 14d supply, fill #0

## 2024-01-31 MED ORDER — TRESIBA FLEXTOUCH 200 UNIT/ML ~~LOC~~ SOPN: 38.0000 [IU] | PEN_INJECTOR | Freq: Every evening | SUBCUTANEOUS | Status: DC

## 2024-01-31 MED ORDER — MIRABEGRON ER 25 MG PO TB24
25.0000 mg | ORAL_TABLET | Freq: Every day | ORAL | Status: DC
  Administered 2024-02-01: 25 mg via ORAL
  Filled 2024-01-31: qty 1
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
## 2024-01-31 NOTE — Plan of Care (Signed)
  Problem: RH Balance Goal: LTG Patient will maintain dynamic standing balance (PT) Description: LTG:  Patient will maintain dynamic standing balance with assistance during mobility activities (PT) Outcome: Completed/Met   Problem: Sit to Stand Goal: LTG:  Patient will perform sit to stand with assistance level (PT) Description: LTG:  Patient will perform sit to stand with assistance level (PT) Outcome: Completed/Met   Problem: RH Bed Mobility Goal: LTG Patient will perform bed mobility with assist (PT) Description: LTG: Patient will perform bed mobility with assistance, with/without cues (PT). Outcome: Completed/Met   Problem: RH Bed to Chair Transfers Goal: LTG Patient will perform bed/chair transfers w/assist (PT) Description: LTG: Patient will perform bed to chair transfers with assistance (PT). Outcome: Completed/Met   Problem: RH Car Transfers Goal: LTG Patient will perform car transfers with assist (PT) Description: LTG: Patient will perform car transfers with assistance (PT). Outcome: Completed/Met   Problem: RH Ambulation Goal: LTG Patient will ambulate in controlled environment (PT) Description: LTG: Patient will ambulate in a controlled environment, # of feet with assistance (PT). Outcome: Completed/Met Goal: LTG Patient will ambulate in home environment (PT) Description: LTG: Patient will ambulate in home environment, # of feet with assistance (PT). Outcome: Completed/Met   Problem: RH Wheelchair Mobility Goal: LTG Patient will propel w/c in controlled environment (PT) Description: LTG: Patient will propel wheelchair in controlled environment, # of feet with assist (PT) Outcome: Completed/Met Goal: LTG Patient will propel w/c in home environment (PT) Description: LTG: Patient will propel wheelchair in home environment, # of feet with assistance (PT). Outcome: Completed/Met

## 2024-01-31 NOTE — Plan of Care (Signed)
  Problem: RH Balance Goal: LTG: Patient will maintain dynamic sitting balance (OT) Description: LTG:  Patient will maintain dynamic sitting balance with assistance during activities of daily living (OT) Outcome: Completed/Met   Problem: Sit to Stand Goal: LTG:  Patient will perform sit to stand in prep for activites of daily living with assistance level (OT) Description: LTG:  Patient will perform sit to stand in prep for activites of daily living with assistance level (OT) Outcome: Completed/Met   Problem: RH Bathing Goal: LTG Patient will bathe all body parts with assist levels (OT) Description: LTG: Patient will bathe all body parts with assist levels (OT) Outcome: Completed/Met   Problem: RH Dressing Goal: LTG Patient will perform upper body dressing (OT) Description: LTG Patient will perform upper body dressing with assist, with/without cues (OT). Outcome: Completed/Met Goal: LTG Patient will perform lower body dressing w/assist (OT) Description: LTG: Patient will perform lower body dressing with assist, with/without cues in positioning using equipment (OT) Outcome: Completed/Met   Problem: RH Toileting Goal: LTG Patient will perform toileting task (3/3 steps) with assistance level (OT) Description: LTG: Patient will perform toileting task (3/3 steps) with assistance level (OT)  Outcome: Completed/Met   Problem: RH Toilet Transfers Goal: LTG Patient will perform toilet transfers w/assist (OT) Description: LTG: Patient will perform toilet transfers with assist, with/without cues using equipment (OT) Outcome: Completed/Met   Problem: RH Tub/Shower Transfers Goal: LTG Patient will perform tub/shower transfers w/assist (OT) Description: LTG: Patient will perform tub/shower transfers with assist, with/without cues using equipment (OT) Outcome: Completed/Met   Problem: RH Memory Goal: LTG Patient will demonstrate ability for day to day recall/carry over during activities of  daily living with assistance level (OT) Description: LTG:  Patient will demonstrate ability for day to day recall/carry over during activities of daily living with assistance level (OT). Outcome: Completed/Met

## 2024-01-31 NOTE — Progress Notes (Signed)
 PROGRESS NOTE   Subjective/Complaints:  Pt continues to have some sleepiness during the day. Getting ready to go home tomorrow.  No additional concerns.    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 + Urge incontinence-chronic, worsened postop- now improved  +LE edema +swallowing difficulty- chronic  + Chronic sleepiness-ongoing  Objective:   No results found.   Recent Labs    01/31/24 0512  WBC 4.0  HGB 8.6*  HCT 25.6*  PLT 169    No results for input(s): "NA", "K", "CL", "CO2", "GLUCOSE", "BUN", "CREATININE", "CALCIUM " in the last 72 hours.      Intake/Output Summary (Last 24 hours) at 01/31/2024 1703 Last data filed at 01/30/2024 2045 Gross per 24 hour  Intake 360 ml  Output --  Net 360 ml        Physical Exam: Vital Signs Blood pressure 138/64, pulse 80, temperature 98.3 F (36.8 C), resp. rate 18, height 5\' 1"  (1.549 m), weight 91.9 kg, SpO2 95%.    General: awake, alert, appropriate, NAD, sitting in WC in her room  HENT: conjugate gaze; oropharynx moist CV: regular rate and rhythm Pulmonary: CTA B/L; non-labored, on RA GI: soft, NT, ND, (+)BS Psychiatric: Pleasant Neurological: Ox3   Skin: PICC line clean, dry, intact.  Small amount of blood around site.  Stable appearance. Back incision covered by TLSO.   MSK:      No apparent deformity. + RUE shoulder abduction/flexion limited to <70 degrees: Unchanged       +`TLSO Neurologic exam: Alert and oriented x 4, follows commands, no dysarthria.  Possibly memory deficits in recall of her medications. Sensation: To light touch reduced in distal fingertips and toes bilaterally; sensation  improving at bilateral knees  CN: 2-12 grossly intact.  No hypertonia      Strength:                RUE: 5/5 SA, 5/5 EF, 5/5 EE, 5/5 WE, 5/5 FF, 5/5 FA                LUE:  5/5 SA, 5/5 EF, 5/5 EE, 5/5 WE, 5/5 FF, 5/5 FA                RLE: 4-/5 HF, 4-/5 KE, 4/5  DF, 4/5  EHL, 4/5  PF                 LLE:  4-/5 HF, 5/5 KE, 5/5  DF, /5  EHL, 5-/5  PF   No changes on exam 4-24  Assessment/Plan: 1. Functional deficits which require 3+ hours per day of interdisciplinary therapy in a comprehensive inpatient rehab setting. Physiatrist is providing close team supervision and 24 hour management of active medical problems listed below. Physiatrist and rehab team continue to assess barriers to discharge/monitor patient progress toward functional and medical goals  Care Tool:  Bathing    Body parts bathed by patient: Right arm, Chest, Abdomen, Right upper leg, Left upper leg,  Face, Left arm, Front perineal area, Buttocks, Right lower leg, Left lower leg   Body parts bathed by helper: Left arm, Front perineal area, Buttocks, Right lower leg, Left lower leg     Bathing assist Assist Level: Supervision/Verbal cueing (Use of BSC for increased periarea reach.)     Upper Body Dressing/Undressing Upper body dressing   What is the patient wearing?: Pull over shirt    Upper body assist Assist Level: Set up assist    Lower Body Dressing/Undressing Lower body dressing      What is the patient wearing?: Pants, Incontinence brief     Lower body assist Assist for lower body dressing: Minimal Assistance - Patient > 75%     Toileting Toileting    Toileting assist Assist for toileting: Minimal Assistance - Patient > 75%     Transfers Chair/bed transfer  Transfers assist     Chair/bed transfer assist level: Contact Guard/Touching assist     Locomotion Ambulation   Ambulation assist   Ambulation activity did not occur: Safety/medical concerns (weakness, pain, decreased  balance)  Assist level: Contact Guard/Touching assist Assistive device: Walker-rolling Max distance: 60'   Walk 10 feet activity   Assist  Walk 10 feet activity did not occur: Safety/medical concerns (weakness, pain, decreased balance)  Assist level: Contact Guard/Touching assist Assistive device: Walker-rolling   Walk 50 feet activity   Assist Walk 50 feet with 2 turns activity did not occur: Safety/medical concerns (weakness, pain, decreased balance)  Assist level: Contact Guard/Touching assist Assistive device: Walker-rolling    Walk 150 feet activity   Assist Walk 150 feet activity did not occur: Safety/medical concerns         Walk 10 feet on uneven surface  activity   Assist Walk 10 feet on uneven surfaces activity did not occur: Safety/medical concerns (weakness, pain, decreased balance)   Assist level: Minimal Assistance - Patient > 75% Assistive device: Walker-rolling   Wheelchair     Assist Is the patient using a wheelchair?: Yes Type of Wheelchair: Manual    Wheelchair assist level: Supervision/Verbal cueing Max wheelchair distance: 500'    Wheelchair 50 feet with 2 turns activity    Assist        Assist Level: Independent   Wheelchair 150 feet activity     Assist      Assist Level: Supervision/Verbal cueing   Blood pressure 138/64, pulse 80, temperature 98.3 F (36.8 C), resp. rate 18, height 5\' 1"  (1.549 m), weight 91.9 kg, SpO2 95%.  Medical Problem List and Plan: 1. 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  OK to stop continuous penicillin  G for showers.             -ELOS/Goals: 9-12 days, SPV PT/OT - 4/25 DC date             -con't CIR   - 4/8: Max a bed mobility, STEDY - very fearful of falling. She did some marching at EOB today with  the walker. Min A short distances goals. Mod A UB Max A LBD and toiletting; really limited by pain and ROM in her right arm.     4/15: Confusion has cleared per therapies. Bed mobility Mod A, transfers Mod A with SPT. Walking about 15 feet with  Min A. SPV with WC. Husband has been very involved and very helpful. She has some ataxia and endurance difficulty. Setup to Min A with UB care, limited by R shoulder pain. Doing well with reacher.   Expected discharge tomorrow    2.  Antithrombotics: -DVT/anticoagulation:  Mechanical: Sequential compression devices, below knee Bilateral lower extremities  -01/13/24 DVT US  neg              -antiplatelet therapy: N/A 3. Acute on chronic pain/Pain Management:  Was on Oxycodone  5 mg and Lyrica  75 mg daily PTA.  --Having surgical pain--continue Oxycodone  prn. Pain worse at nights-->will schedule oxycodone  at bedtime.  -Voltaren  gel QID -4/7: DC morphine  due to no utilization - 4/8: Oxy scheduled overnight and when taken in the a.m. causing some lethargy intermittently--patient has tolerated hydrocodone  in the past, will switch to Norco 2.5 to 5 mg every 6 hours as needed.  Will also schedule Tylenol  500 mg 3 times daily. - 4/9: LFTS up; DC scheduled tylenol . Add oxy to allergy list d/t confusion. Doing much better with Norco - 4/10: Having some a.m. confusion with p.m. Norco.  Has had issues with tramadol in the past.  Patient is already using rarely, advised to continue this regimen and wean off as tolerated. 4-14: No further confusion reported.  DC naproxen  as below. 4/19- still having pain, but feels meds are adequate 4/21  Pregabalin  increased  100mg  TID from 75mg  TID- likely max dose with her renal function  4/23 PDMP review indicates she takes oxycodone  at home 4/24 will decrease Lyrica  back to 75 mg, may be contributing to to sedation 4. Mood/Behavior/Sleep: LCSW to folow for evaluation and support.              -antipsychotic agents: N/A  -Cymbalta   60mg  nightly, mirapex  1mg  at bedtime, mirtazapine  30mg  nightly  -Patient reports sleeping well  5. Neuropsych/cognition: This patient is capable of making decisions on his own behalf.  4/8: Having increased confusion, staring episodes without new neurologic deficits.  Adjusting pain medications as above.  Has had issues with hallucinations/confusion with cefepime  in the past, would consider antibiotic adjustment if alternative causes ruled out.  4/9: episodes resolved with pain medication changes  6. Skin/Wound Care: Routine pressure relief measures. Monitor incision for healing.   4/7: Neurosurgery removed Hemovac over the weekend, current VAC with no drainage, if no further output in 24 hours will discuss removal with Dr. Philip Bravo removal in 2 days per Dr. Rochelle Chu  Wound VAC removed 4-10- -4-11: Limited visualizations due to TLSO when out of bed, nursing to place picture of wound in chart today.  7. Fluids/Electrolytes/Nutrition: Monitor I/O. Routine labs  -01/12/24 CMP stable today, Cr down to 1.55, AST 44, improving, monitor  4-7: LFTs mildly up trending, BUN and creatinine down, mild hyponatremia.  Limit Tylenol  to 3000 mg daily as above.  Repeat in 2 to 3 days.  4/9: LFTS continue up; NA 131. See below  4/22 LFTS improved, Hx of cirrhosis noted in chart review   8. Neuropathy/band like pain around waist: Will increase Lyrica  to TID 9. Osteomyelitis/discitis. Pending PICC placement. -- Starting 6 weeks of daptomycin  and ertapenem  for 6 weeks through 02/20/24 to be followed by PO suppression doxycycline  and ciprofloxacin .  -01/13/24 Cx NGTD x4d--> 1 of 2 cultures grew cutibacterium acnes--covered with current antibiotics 4-7: ESR added on to a.m. labs.  CRP up today; discussed with ID, no new orders. 4-10: Infectious disease changing ertapenem  to Cipro  due to LFT  elevations as below.  Continue daptomycin ; has had issues with vancomycin  in the past 4-11: Daptomycin  switched to continuous  penicillin  G yesterday afternoon.  Continuing Cipro .  Per infectious disease/pharmacy looking into insurance coverage for continuous penicillin  G cassette versus switching to Rocephin  prior to discharge.  If switching to Rocephin , asked to be informed a few days prior so we could trial it at rehab, given significant side effects to cefepime  in the past (more likely related to accumulation with AKI - not true allergy). 4/14: tolerating rocephin  since Friday Discussed importance of continuing antibiotics as directed  10. HTN/edema:  Monitor BP TID--on torsemide  40mg  daily and coreg  6.125mg  BID (not reordered?). -4/5-6/25 BPs mostly fine, coreg  not ordered as of now; remain off for now 4-7: Systolic low, p.o. intakes minimal.  Reduce torsemide  for tomorrow a.m. to 20 mg, and encourage p.o. fluids today. 4-8: Diastolic remains low, will DC torsemide .  Schedule daily weights. 4/9: BP up a bit, continue to hold torsemide   4-10: Increased lower extremity edema, remains with diastolic hypotension but wishes to resume home torsemide ; will resume at 20 mg daily.  Continue teds 4-11: Peripheral edema improved, patient tolerating well.  Continue current medications. -4/12-13/25 BPs great, monitor 4/14: Diastolic soft, but stable. Monitor with therapies.  4/15: Increasing peripheral edema; diastolic remains soft but asymptomatic.  Increase torsemide  back to 40 mg daily, will give extra 20 mg dose today to catch up.  Monitor weights. 4-16: Weights down significantly.  BP tolerating current dose of torsemide .  Continue current medications. 4-18: Weights today likely inaccurate.  Peripheral edema stable.  No shortness of breath.  Vital stable.  Monitor. 4/19- Weight stable from 2 days ago- con't to monitor 4/20- weight 86.3- has been lower than normal- by 2 kg- appears a little lower than expected- will mointor 4/24 BP overall controlled, weight a little bit higher today but do not think this was an accurate  measurement Vitals:   01/27/24 1315 01/27/24 2015 01/28/24 0517 01/28/24 2034  BP: 130/65 (!) 124/59 (!) 125/57 (!) 128/59   01/29/24 0455 01/29/24 1507 01/29/24 2003 01/30/24 0438  BP: 136/60 (!) 124/54 (!) 137/58 (!) 152/68   01/30/24 1553 01/30/24 1940 01/31/24 0302 01/31/24 1611  BP: 130/60 128/69 (!) 141/57 138/64    Filed Weights   01/29/24 0452 01/30/24 0438 01/31/24 0500  Weight: 88 kg 87.7 kg 91.9 kg    11. T2DM: Hgb A1C-7.9. Used Tresiba  50-120/ 5 units ac meal TID at home.              --Continue insulin  glargine 20 units daily with SSI.  -4/5-6/25 CBGs a bit better this weekend, monitor trend but may need further adjustments since they're still mostly >200 4/7: Blood sugars remain elevated, increase glargine to 25 units daily starting tomorrow a.m. 4/11: Blood sugars uptrending with improved p.o.; resume home standing short acting insulin , started 3 units 3 times daily WC -4/14: CBG elevated, seems to have been better overall over the weekend so we will monitor for 24 hours and if remains consistently elevated will adjust insulin  4-15: Blood sugars still tending high, with highs to this a.m., increase glargine to 30 units daily-- monitor increase 4-16 4/17: Increase Semglee  to 35 units.  Increase Premeal insulin  to 5 units standing--May need Semglee  further increased over the weekend 4/19- increased Lantus  to 38 units due to BG's still in low 200's 4/20- CBG this AM was 164- down and much better than prior to change in Lantus  4/21 monitor CBGs  today, continue increase lantus  tomorrow  4/22 increase mealtime aspart to 7u  4/24 Increase Lantus  to 40u CBG (last 3)  Recent Labs    01/31/24 0611 01/31/24 1116 01/31/24 1659  GLUCAP 192* 212* 200*    12.  Acute on CKD stage III--baseline creatinine 1.4: SCr has had rise to 1.56-->will d/c celebrex .  -01/12/24 Cr 1.55 today, down from 1.86 yesterday, monitor Monday  4-7, 4/9: BUN, creatinine improving. 4-11: BUN stable,  creatinine slightly up to 1.26; monitor on Monday, may need to DC naprosyn  if up trended -01/19/24 Cr up to 1.34, still better than prior and near her baseline; will decrease naprosyn  to 250mg  BID for now, monitor on Monday labs.  4/14, 4/18: Cr stable 1.3-1.4 since resumption of home torsemide . 4/21 BUN/CR stable, continue current regimen  4/24 BUN/creatinine overall stable at 25/1.37  13. Fever blisters: Will start valtrex  1g BID x1wk as symptomatic. Improving, EOT 4/11  14. Right shoulder pain: Question strain but heard a pop this am and extremely concerned that she may have torn her RTC again --Voltaren  gel for local measures.  -- Consider MRI if no improvement 4/8: R shoulder remains very painful/limited - will get x-ray and consider MRI if no acute findings. 4/9: Xray with mild changes; discussed with patient, unable to get MRI due to spinal cord stimulator leads, will order CT 4/10: CT nondiagnostic, some changes "suggestive of rotator cuff tear", mild before meals and glenohumeral degenerative changes.  Consulted orthopedics, appreciate Steffanie Edouard PA-C's evaluation, recommending no intervention at this time, added naproxen  500 mg twice daily. 4-14: Minimal improvements with naproxen , will DC to CKD as above   15. Constipation: Continue Senna 2 tabs daily--miralax  was added on 04/04 -01/12/24 LBM yesterday, didn't get the 32oz miralax  so d/c'd. Continue miralax  17g daily for now.  -01/13/24 LBM last night, cont regimen as is for now, adjust accordingly 4-7: Increase Senokot-S to 2 tabs twice daily, MiraLAX  BID 01/24/24 LBM   4/19- LBM yesterday 4/23 LBM today, continue current regimen  16. Depression/Anxiety: Continue Cymbalta  60mg  nightly and Mirtazapine  30mg  nightly.    17. Neurogenic bladder/urge incontinence: Continue foley. D/c in am if has results with laxative tonight. -01/12/24 had BM last night, will pull foley, do timed toileting and PVRs q4-6h, ISC if PVR >359ml -01/13/24  urinating well, low PVRs, continue checking but could d/c tomorrow if still urinating fine.  4-7: Single episode of incontinence overnight, with PVR 170s.  Otherwise continent.  Continue current regimen. - 4/16: start ditropan  2.5 mg BID. Q6H bladder scans 4-17: Increase Ditropan  to 5 mg twice daily. 4/18: Bladder scan 0, increase Ditropan  to 5 mg 4 times daily.  No fevers, dysuria or leukocytosis to indicate UTI. 4/19- pt wants to increase- I don't feel that's appropriate considering at the max dose of Oxybutynin  I would do for a 68 yr old 4/23 Pt reports this is doing better, continent 18. ABLA: Hgb 10.5 01/12/24, monitor  -4-7: Hemoglobin down from 10.5-9.4; no overt signs of bleeding, repeat in 1 to 2 days  4-9: Hemoglobin stable, 8.8 -01/20/24 Hgb 8.1 on 4/11 but up to 8.3 on 4/12; monitor tomorrow since still fairly stable.   -4/17 stable  19. GERD: reordered Protonix  40mg  daily -4-10: Increase Protonix  to 40 mg twice daily for GI prophylaxis with antibiotics and now naprosyn  4-14: Will reduce Protonix  back to once daily with DC naproxen  4/22 increase protonix  to BID  20.  Transaminitis.  Uptrending on 4-7 labs.  -May be stasis related  due to poor mobility; Tylenol  utilization within parameters  -Repeat in 2 days  4/9: Increasing. DC standing tylenol . Get liver US  today, + GGT. Per pharmacy medications unlikely contributors.   4-10: Liver ultrasound without acute findings.  Continues to uptrend; discussed with pharmacy, infectious disease, feel this is close enough to her baseline elevations but no concern but will change ertapenem  to Cipro .  4/11:  LFTs have stabilized.  Monitor.  -01/19/24 LFTs fairly stable overall, will change CMP to Monday instead of daily through the weekend. --ALP continues to uptrend, others approximately stable.  - 4/18: LFTs stable/downtrending.  4/22 improved, she has hx of cirrhosis  4/24 continues to improve, follow-up with PCP 21. Hyponatremia.   -  ?etiology; possible with abx but unlikely   - Holding torsemide  as above   - Add 1200 cc fluid restriciton   - Serum OSM + urine OSM, NA 4/9   - repeat in AM  4-10: Improved today, 132.  Will need to monitor closely with resumption of torsemide .  Add labs in a.m.  01/19/24 stable 133  4-14, 4/18: stable 22. Sedation/sleepiness  4/20- I think it's Oxybutynin  that's cuasing sedation- I suggest moving to Myrbetriq  since causes less sedation- but don't want to make change without d/w primary physician- will leave to Dr Dorn Gaskins.   -4/24 change oxybutynin  to myrbetriq  to see if it helps sedation. Also sounds like pt used CPAP/BIPIP years ago for sleep apnea, she reports she was not tolerating. She likely needs to f/u with her sleep doctor 23. Dysphagia  -PPI increase to BID, SLP consult  -F/U with GI provider     LOS: 20 days A FACE TO FACE EVALUATION WAS PERFORMED  Katelyn [name] 01/31/2024, 5:03 PM

## 2024-01-31 NOTE — Progress Notes (Signed)
 Occupational Therapy Discharge Summary  Patient Details  Name: Katelyn [name] MRN: [id number] Date of Birth: 1955/11/29  Date of Discharge from OT service:January 31, 2024  Patient has met 9 of 9 long term goals due to improved activity tolerance, improved balance, postural control, ability to compensate for deficits, functional use of  RIGHT lower and LEFT lower extremity, improved attention, improved awareness, and improved coordination.  Patient to discharge at [facility] Assist level.  Patient's care partner is independent to provide the necessary physical and cognitive assistance at discharge.    Reasons goals not met: NA  Recommendation:  Patient will benefit from ongoing skilled OT services in home health setting to continue to advance functional skills in the area of BADL and Reduce care partner burden.  Equipment: Custom manual WC for increased independence with functional mobility.   Reasons for discharge: treatment goals met and discharge from hospital  Patient/family agrees with progress made and goals achieved: Yes  OT Discharge Precautions/Restrictions  Precautions Precautions: Fall;Back Recall of Precautions/Restrictions: Impaired Precaution/Restrictions Comments: Patient able to recall spinal precautions, however requires intermediate cuing to implemment during functional tasks. Required Braces or Orthoses: Spinal Brace Spinal Brace: Thoracolumbosacral orthotic;Applied in sitting position Restrictions Weight Bearing Restrictions Per Provider Order: No ADL ADL Eating: Independent Where Assessed-Eating: Wheelchair Grooming: Modified independent Where Assessed-Grooming: Wheelchair, Sitting at sink Upper Body Bathing: Modified independent Where Assessed-Upper Body Bathing: Shower Lower Body Bathing: Supervision/safety Where Assessed-Lower Body Bathing: Shower, Other (Comment) (Seated on BSC to access periarea.) Upper Body Dressing: Supervision/safety Where  Assessed-Upper Body Dressing: Wheelchair Lower Body Dressing: Minimal assistance Where Assessed-Lower Body Dressing: Wheelchair Toileting: Minimal assistance Where Assessed-Toileting: Toilet, Bedside Commode Toilet Transfer: Furniture conservator/restorer Method: Ambulating, Stand pivot Acupuncturist: Gaffer: Not assessed Film/video editor: Minimal assistance, Administrator, arts Method: Designer, industrial/product: Other (comment) (BSC) Vision Baseline Vision/History: 1 Wears glasses Patient Visual Report: No change from baseline Vision Assessment?: No apparent visual deficits Perception  Perception: Within Functional Limits Praxis Praxis: WFL Cognition Cognition Overall Cognitive Status: Within Functional Limits for tasks assessed Arousal/Alertness: Awake/alert Orientation Level: Person;Place;Situation Memory: Appears intact Awareness: Appears intact Problem Solving: Appears intact Safety/Judgment: Impaired Comments: Patient able to recall spinal precautions, however requires intermediate cuing to implemment during functional tasks. Brief Interview for Mental Status (BIMS) Repetition of Three Words (First Attempt): 2 Temporal Orientation: Year: Correct Temporal Orientation: Month: Accurate within 5 days Temporal Orientation: Day: Correct Recall: "Sock": Yes, no cue required Recall: "Blue": Yes, no cue required Recall: "Bed": Yes, no cue required BIMS Summary Score: 14 Sensation Sensation Light Touch: Impaired Detail Peripheral sensation comments: Neuropathy in fingertips/toes. Light Touch Impaired Details: Impaired RUE;Impaired LUE;Impaired RLE;Impaired LLE Hot/Cold: Appears Intact Proprioception: Impaired by gross assessment Stereognosis: Impaired by gross assessment Coordination Gross Motor Movements are Fluid and Coordinated: No Fine Motor Movements are Fluid and Coordinated:  Yes Coordination and Movement Description: BLE ataxia with functional mobility. Finger Nose Finger Test: WFL bilaterally but slow Motor  Motor Motor: Ataxia Motor - Discharge Observations: Ataxic BLE during functional mobility. Mobility  Transfers Sit to Stand: Contact Guard/Touching assist Stand to Sit: Contact Guard/Touching assist  Trunk/Postural Assessment  Cervical Assessment Cervical Assessment: Within Functional Limits Thoracic Assessment Thoracic Assessment:  (Limited ROM due to TLSO & spinal precautions) Lumbar Assessment Lumbar Assessment: Exceptions to [facility] (Limited ROM due to TLSO & spinal precautions) Postural Control Postural Control: Deficits on evaluation Righting Reactions: Delayed and inadequate Protective Responses: Delayed and inadequate  Balance Balance Balance Assessed: Yes Static Sitting Balance Static Sitting - Balance Support: Feet supported Static Sitting - Level of Assistance: 6: Modified independent (Device/Increase time) Dynamic Sitting Balance Dynamic Sitting - Balance Support: Feet supported;During functional activity Dynamic Sitting - Level of Assistance: 6: Modified independent (Device/Increase time) Dynamic Sitting - Balance Activities: Lateral lean/weight shifting Static Standing Balance Static Standing - Balance Support: Bilateral upper extremity supported;During functional activity Static Standing - Level of Assistance: 5: Stand by assistance (CGA) Dynamic Standing Balance Dynamic Standing - Balance Support: During functional activity Dynamic Standing - Level of Assistance: 4: Min assist;5: Stand by assistance (CGA-Min A) Dynamic Standing - Balance Activities: Lateral lean/weight shifting;Reaching for objects Extremity/Trunk Assessment RUE Assessment RUE Assessment: Exceptions to [facility] Active Range of Motion (AROM) Comments: Limited/painful ROM due to previous injury, ~60-75 degrees of shoulder abduction/flexion without  compensation. General Strength Comments: 2-/5 LUE Assessment LUE Assessment: Within Functional Limits  Artemus Biles, OTR/L, MSOT  01/31/2024, 6:36 AM

## 2024-01-31 NOTE — Progress Notes (Signed)
 Occupational Therapy Session Note  Patient Details  Name: Katelyn [name] MRN: [id number] Date of Birth: [birth year]  Today's Date: 01/31/2024 OT Individual Time: 1914-7829 OT Individual Time Calculation (min): 56 min    Short Term Goals: Week 3:  OT Short Term Goal 1 (Week 3): STGs=LTGs due to patient's estimated length of stay.  Skilled Therapeutic Interventions/Progress Updates:  Pt received resting in bed, minimal difficulty to arouse, 2-3/10 pain at surgical site, medicated during session. Pt comes to EOB with supervision, ambulating walk-in shower transfer with CGA + RW, full-body bathing/dressing with levels of assistance noted below. Spouse present at end of session, time dedicated to providing unofficial caregiver education, although spouse has been present during sessions. Pt performs hands-on assistance of transfer/LB dressing safely. Reviewed functional impacts of pain medications (I.e increased drowsiness) and recommendation for use of BSC next to bed at night time. Both receptive to education. Pt remained performing sink-side care with all immediate needs met.   Therapy Documentation Precautions:  Precautions Precautions: Fall, Back Recall of Precautions/Restrictions: Impaired Precaution/Restrictions Comments: Patient able to recall spinal precautions, however requires intermediate cuing to implemment during functional tasks. Required Braces or Orthoses: Spinal Brace Spinal Brace: Thoracolumbosacral orthotic, Applied in sitting position Restrictions Weight Bearing Restrictions Per Provider Order: No  ADL: ADL Eating: Independent Where Assessed-Eating: Wheelchair Grooming: Modified independent Where Assessed-Grooming: Wheelchair, Sitting at sink Upper Body Bathing: Modified independent Where Assessed-Upper Body Bathing: Shower Lower Body Bathing: Supervision/safety Where Assessed-Lower Body Bathing: Shower, Other (Comment) (Seated on BSC to access periarea.) Upper Body  Dressing: Supervision/safety Where Assessed-Upper Body Dressing: Wheelchair Lower Body Dressing: Minimal assistance Where Assessed-Lower Body Dressing: Wheelchair Toileting: Minimal assistance Where Assessed-Toileting: Toilet, Psychiatrist Transfer: Furniture conservator/restorer Method: Ambulating, Stand pivot Acupuncturist: Gaffer: Not assessed Film/video editor: Minimal assistance, Administrator, arts Method: Designer, industrial/product: Other (comment) (BSC)   Therapy/Group: Individual Therapy  Artemus Biles, OTR/L, MSOT  01/31/2024, 8:59 AM

## 2024-02-01 ENCOUNTER — Other Ambulatory Visit (HOSPITAL_COMMUNITY): Payer: Self-pay

## 2024-02-01 LAB — GLUCOSE, CAPILLARY
Glucose-Capillary: 160 mg/dL — ABNORMAL HIGH (ref 70–99)
Glucose-Capillary: 205 mg/dL — ABNORMAL HIGH (ref 70–99)

## 2024-02-01 MED ORDER — NOVOLOG RELION 100 UNIT/ML IJ SOLN
2.0000 [IU] | Freq: Three times a day (TID) | INTRAMUSCULAR | Status: AC
Start: 2024-02-01 — End: ?

## 2024-02-01 MED ORDER — HEPARIN SOD (PORK) LOCK FLUSH 100 UNIT/ML IV SOLN
250.0000 [IU] | INTRAVENOUS | Status: AC | PRN
  Administered 2024-02-01: 250 [IU]

## 2024-02-01 MED ORDER — TRESIBA FLEXTOUCH 200 UNIT/ML ~~LOC~~ SOPN: 40.0000 [IU] | PEN_INJECTOR | Freq: Every evening | SUBCUTANEOUS | Status: DC

## 2024-02-01 MED ORDER — TRESIBA FLEXTOUCH 200 UNIT/ML ~~LOC~~ SOPN: 40.0000 [IU] | PEN_INJECTOR | Freq: Every day | SUBCUTANEOUS | Status: DC

## 2024-02-01 NOTE — Progress Notes (Signed)
 Inpatient Rehabilitation Care Coordinator Discharge Note   Patient Details  Name: Katelyn [name] MRN: [id number] Date of Birth: 07/15/1956   Discharge location: D/c to home with her husband  Length of Stay: 20 days  Discharge activity level: CGA  Home/community participation: Limited  Patient response WU:[id number] Literacy - How often do you need to have someone help you when you read instructions, pamphlets, or other written material from your doctor or pharmacy?: Rarely  Patient response YN:WGNFAO Isolation - How often do you feel lonely or isolated from those around you?: Never  Services provided included: MD, RD, PT, OT, CM, TR, Pharmacy, Neuropsych, SW, RN, SLP  Financial Services:  Field seismologist Utilized: Banker  Choices offered to/list presented to: patient and husband  Follow-up services arranged:  DME, Home Health, Other (Comment) Home Health Agency: [facility] for HHPT/OT/SLP/aide/SN    DME : [employer] for specialty w/c; [facility] for IV abx    Patient response to transportation need: Is the patient able to respond to transportation needs?: Yes In the past 12 months, has lack of transportation kept you from medical appointments or from getting medications?: No In the past 12 months, has lack of transportation kept you from meetings, work, or from getting things needed for daily living?: No   Patient/Family verbalized understanding of follow-up arrangements:  Yes  Individual responsible for coordination of the follow-up plan: contact pt or husband  Confirmed correct DME delivered: Rennis Case 02/01/2024    Comments (or additional information):fam edu completed  Summary of Stay    Date/Time Discharge Planning CSW  01/28/24 1449 Pt will dc/ to home with her husband who will be primary cargeiver. Pt needs to be as independent as possible as he is not physically able to assist. IV abx until 5/4. HHA- [facility] and  [facility] for abx. SW will confirm there are no barriers to discharge. AAC  01/22/24 1102 Pt will dc/ to home with her husband who will be primary cargeiver. Pt needs to be as independent as possible as he is not physically able to assist. IV abx until 5/4. HHA- [facility] and [facility] for abx. SW will confirm there are no barriers to discharge. AAC  01/15/24 0931 Pt will dc/ to home with her husband who will be primary cargeiver. Pt needs to be as independent as possible as he is not physically able to assist. SW will confirm there are no barriers to discharge. AAC       Harol Shabazz A Brendolyn Callas

## 2024-02-01 NOTE — Progress Notes (Signed)
 Inpatient Rehabilitation Discharge Medication Review by a Pharmacist  A complete drug regimen review was completed for this patient to identify any potential clinically significant medication issues.  High Risk Drug Classes Is patient taking? Indication by Medication  Antipsychotic No   Anticoagulant No   Antibiotic Yes, as an intravenous medication Ceftriaxone /Cipro  - lumbar infection  Opioid Yes Vicodin - pain  Antiplatelet No   Hypoglycemics/insulin  Yes SSI, Glargine Insulin , Mounjaro - DM  Vasoactive Medication No   Chemotherapy No   Other Yes APAP, Diclofenac  Gel - pain Duloxetine  - Mood, Pain Pramipexole  - Restless Leg Syndrome Torsemide  - Diuretic Remeron /melatonin - sleep Methocarbamol  - muscle spasms MVI - supplement Myrbetriq  - OAB Protonix  - GERD Lyrica  - neuropathy     Type of Medication Issue Identified Description of Issue Recommendation(s)  Drug Interaction(s) (clinically significant)     Duplicate Therapy     Allergy     No Medication Administration End Date     Incorrect Dose     Additional Drug Therapy Needed     Significant med changes from prior encounter (inform family/care partners about these prior to discharge).           Other       Clinically significant medication issues were identified that warrant physician communication and completion of prescribed/recommended actions by midnight of the next day:  No  Name of provider notified for urgent issues identified:   Provider Method of Notification:     Pharmacist comments:   Time spent performing this drug regimen review (minutes):  30   Ivery Marking, PharmD, [place], AAHIVP, CPP Infectious Disease Pharmacist 02/01/2024 7:49 AM

## 2024-02-01 NOTE — Progress Notes (Signed)
 Patient ID: Katelyn [name], female   DOB: 1955/12/10, 68 y.o.   MRN: [id number]  SW met with pt in room to review discharge. Confirms IV abx delivered yesterday and all education was done. SW reminded on HHA in place. No further questions/concerns reported.  Norval Been, MSW, LCSW Office: [phone] Cell: [phone] Fax: [phone]

## 2024-02-01 NOTE — Discharge Summary (Signed)
 Physician Discharge Summary  Patient ID: Katelyn [name] MRN: [id number] DOB/AGE: [birth year] 68 y.o.  Admit date: 01/11/2024 Discharge date: 02/01/2024  Discharge Diagnoses:  Principal Problem:   Thoracic myelopathy Active Problems:   Type 2 diabetes mellitus without complication, with long-term current use of insulin  (HCC)   Chronic pain syndrome  Hyperactive bladder  Right shoulder pain    Acute on chronic back pain  Neuropathy  Restless leg syndrome  Abnormal LFTs  Hyponatremia.  Hypertension.  Acute on chronic kidney disease.  Fever blisters  Constipation.  Acute blood loss anemia  GERD  Neurogenic bladder.   Discharged Condition: stable  Significant Diagnostic Studies: CT SHOULDER RIGHT WO CONTRAST Result Date: 01/17/2024 CLINICAL DATA:  Concern for rotator cuff tear. Patient unable to receive MRI due to spinal cord stimulator leads. EXAM: CT OF THE UPPER RIGHT EXTREMITY WITHOUT CONTRAST TECHNIQUE: Multidetector CT imaging of the upper right extremity was performed according to the standard protocol. RADIATION DOSE REDUCTION: This exam was performed according to the departmental dose-optimization program which includes automated exposure control, adjustment of the mA and/or kV according to patient size and/or use of iterative reconstruction technique. COMPARISON:  Right shoulder radiographs dated 01/15/2024. FINDINGS: Evaluation of the rotator cuff, ligaments, and tendons is limited on this CT examination. Bones/Joint/Cartilage No acute fracture. Superior migration of the humeral head with reduction of the acromial humeral interval. Subcortical cystic changes and irregularity of the greater tuberosity. Glenohumeral joint effusion with a tiny loose bodies in the anterior joint space. Suspected fluid in the subacromial/subdeltoid bursa. Mild degenerative changes of the glenohumeral joint with asymmetric joint space narrowing and small marginal osteophytes. There is anterior decentering of  the humeral head relative to the glenoid. The acromioclavicular joint is anatomically aligned with mild-to-moderate degenerative changes. Partially visualized fusion hardware at the level of the mid to lower thoracic spine. Partially visualized spinal stimulator lead terminates at the T6 level. Ligaments Ligaments are suboptimally evaluated by CT. Muscles and Tendons No significant muscle atrophy. Soft tissue Partially visualized right upper extremity PICC. No enlarged lymph nodes identified in the field of view. IMPRESSION: 1. Evaluation of the rotator cuff, ligaments, and tendons is limited on this CT examination. Within these limitations, there are secondary/indirect signs, as described above, suggestive of rotator cuff tear/chronic rotator cuff pathology. Further evaluation with CT arthrogram could be considered. 2. Mild degenerative changes of the glenohumeral joint with joint effusion. 3. Mild-to-moderate degenerative changes of the acromioclavicular joint. 4. Partially visualized fusion hardware at the level of the mid to lower thoracic spine. Electronically Signed   By: Mannie Seek M.D.   On: 01/17/2024 08:41   US  Abdomen Limited RUQ (LIVER/GB) Result Date: 01/17/2024 CLINICAL DATA:  Elevated LFT EXAM: ULTRASOUND ABDOMEN LIMITED RIGHT UPPER QUADRANT COMPARISON:  CT 03/13/2023 FINDINGS: Gallbladder: Surgically absent Common bile duct: Diameter: 3.4 mm Liver: Echogenicity within normal limits. Suspicion of surface nodularity of the liver. No focal hepatic abnormality is seen. Portal vein is patent on color Doppler imaging with normal direction of blood flow towards the liver. Other: None. IMPRESSION: 1. Status post cholecystectomy. No biliary dilatation. 2. Suspicion of surface nodularity of the liver, correlate for cirrhosis. Electronically Signed   By: Esmeralda Hedge M.D.   On: 01/17/2024 00:56   DG Shoulder Right Result Date: 01/15/2024 CLINICAL DATA:  Acute pain of right shoulder due to trauma.  EXAM: RIGHT SHOULDER - 2+ VIEW COMPARISON:  None Available. FINDINGS: There is no evidence of fracture or dislocation. Mild degenerative change. Subcortical cystic  change in the lateral humeral head. Soft tissues are unremarkable. Right upper extremity PICC is partially included in the field of view. IMPRESSION: 1. No fracture or dislocation of the right shoulder. 2. Mild degenerative change. Electronically Signed   By: Chadwick Colonel M.D.   On: 01/15/2024 18:57   VAS US  LOWER EXTREMITY VENOUS (DVT) Result Date: 01/13/2024  Lower Venous DVT Study Patient Name:  Katelyn [name]  Date of Exam:   01/13/2024 Medical Rec #: [id number]    Accession #:    [id number] Date of Birth: [birth year]    Patient Gender: F Patient Age:   68 years Exam Location:  [facility] Procedure:      VAS US  LOWER EXTREMITY VENOUS (DVT) Referring Phys: Joesph Marcy --------------------------------------------------------------------------------  Indications: Edema. Other Indications: Rehab patient. Risk Factors: Back surgery on 01/09/2024, decreased mobility. Comparison Study: Previous exam on 05/12/2019 was negative for DVT Performing Technologist: Arlyce Berger RVT, RDMS  Examination Guidelines: A complete evaluation includes B-mode imaging, spectral Doppler, color Doppler, and power Doppler as needed of all accessible portions of each vessel. Bilateral testing is considered an integral part of a complete examination. Limited examinations for reoccurring indications may be performed as noted. The reflux portion of the exam is performed with the patient in reverse Trendelenburg.  +---------+---------------+---------+-----------+----------+--------------+ RIGHT    CompressibilityPhasicitySpontaneityPropertiesThrombus Aging +---------+---------------+---------+-----------+----------+--------------+ CFV      Full           Yes      Yes                                  +---------+---------------+---------+-----------+----------+--------------+ SFJ      Full                                                        +---------+---------------+---------+-----------+----------+--------------+ FV Prox  Full           Yes      Yes                                 +---------+---------------+---------+-----------+----------+--------------+ FV Mid   Full           Yes      Yes                                 +---------+---------------+---------+-----------+----------+--------------+ FV DistalFull           Yes      Yes                                 +---------+---------------+---------+-----------+----------+--------------+ PFV      Full                                                        +---------+---------------+---------+-----------+----------+--------------+ POP      Full           Yes      Yes                                 +---------+---------------+---------+-----------+----------+--------------+  PTV      Full                                                        +---------+---------------+---------+-----------+----------+--------------+ PERO     Full                                                        +---------+---------------+---------+-----------+----------+--------------+   +---------+---------------+---------+-----------+----------+--------------+ LEFT     CompressibilityPhasicitySpontaneityPropertiesThrombus Aging +---------+---------------+---------+-----------+----------+--------------+ CFV      Full           Yes      Yes                                 +---------+---------------+---------+-----------+----------+--------------+ SFJ      Full                                                        +---------+---------------+---------+-----------+----------+--------------+ FV Prox  Full           Yes      Yes                                  +---------+---------------+---------+-----------+----------+--------------+ FV Mid   Full           Yes      Yes                                 +---------+---------------+---------+-----------+----------+--------------+ FV DistalFull           Yes      Yes                                 +---------+---------------+---------+-----------+----------+--------------+ PFV      Full                                                        +---------+---------------+---------+-----------+----------+--------------+ POP      Full           Yes      Yes                                 +---------+---------------+---------+-----------+----------+--------------+ PTV      Full                                                        +---------+---------------+---------+-----------+----------+--------------+  PERO     Full                                                        +---------+---------------+---------+-----------+----------+--------------+     Summary: BILATERAL: - No evidence of deep vein thrombosis seen in the lower extremities, bilaterally. -No evidence of popliteal cyst, bilaterally.   *See table(s) above for measurements and observations. Electronically signed by Genny Kid MD on 01/13/2024 at 10:44:45 AM.    Final      Labs:  Comprehensive Metabolic Panel:    Latest Ref Rng & Units 01/28/2024    5:12 AM 01/25/2024    4:00 AM 01/21/2024    4:11 AM  CMP  Glucose 70 - 99 mg/dL 332  951  884   BUN 8 - 23 mg/dL 25  29  26    Creatinine 0.44 - 1.00 mg/dL 1.66  0.63  0.16   Sodium 135 - 145 mmol/L 132  135  135   Potassium 3.5 - 5.1 mmol/L 3.8  3.8  4.4   Chloride 98 - 111 mmol/L 95  98  98   CO2 22 - 32 mmol/L 28  28  27    Calcium  8.9 - 10.3 mg/dL 8.6  8.5  8.7   Total Protein 6.5 - 8.1 g/dL 6.0  6.0  5.8   Total Bilirubin 0.0 - 1.2 mg/dL 0.5  0.4  0.3   Alkaline Phos 38 - 126 U/L 263  300  321   AST 15 - 41 U/L 36  57  65   ALT 0 - 44 U/L 37  52  67       CBC:    Latest Ref Rng & Units 01/31/2024    5:12 AM 01/28/2024    5:12 AM 01/24/2024    4:00 AM  CBC  WBC 4.0 - 10.5 K/uL 4.0  4.1  3.8   Hemoglobin 12.0 - 15.0 g/dL 8.6  8.2  8.2   Hematocrit 36.0 - 46.0 % 25.6  25.7  25.1   Platelets 150 - 400 K/uL 169  170  157      CBG: Recent Labs  Lab 01/31/24 1116 01/31/24 1659 01/31/24 2103 02/01/24 0527 02/01/24 1116  GLUCAP 212* 200* 294* 160* 205*    Brief HPI:   Katelyn [name] is a 69 y.o. 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 She was admitted on 01/07/24 with progressive weakness BLE, difficulty walking and urinary retention. She was taken to OR on 01/09/24 for decompressive thoracic lam with foraminotomies t9-t10 with exploration of T10- L2 fusion with removal of T 10 pedicle screw and hardware with posterior fixation T7-L1 by Dr. Rochelle Chu. ID recommended daptomycin  and ertapenum pending wound cultures.  PT/OT was consulted and patient was requiring max to total assist with ADLs and mobility. She was independent prior to her recent decline and CIR was consulted due to functional decline. Aaron Aas   Hospital Course: JERSEE WINIARSKI was admitted to rehab 01/11/2024 for inpatient therapies to consist of PT, ST and OT at least three hours five days a week. Past admission physiatrist, therapy team and rehab RN have worked together to provide customized collaborative inpatient rehab. She was maintained on  SQ lovenox  for DVT prophylaxis. BLE dopplers done were negative for DVT. Wound VAC was removed on 4/10. Her back incision is C/D/I and healing well without s/s of infection. Oxycodone  has been used on prn basis for pain management but she has had intermittent lethargy with confusion due to this therefore this was changed to hydrocodone  prn. She was advised to use 1/2 to one tablet as needed for severe pain. She  was unable to tolerate robaxin  due to confusion SE. Patient reported hearing a pop in her arm and was concerned about damaging her RTC again. She did report increase in pain right shoulder CT ordered showing  evidence of RTC tear and naprosyn  was added briefly per ortho input. This was discontinued after 2 weeks to to lack of improvement in symptoms and acute on chronic renal failure.  Serial check of BMET showed worsening of SCr briefly but this improved back to baseline off Naprosyn . PPI was increased to BID to help manage GERD symptoms.  Speech therapy evaluation done revealing pharyngoesophageal dysphagia and she was able to tolerate textures without s/s of aspiration. She was advised to eat softer foods and follow up with GI provider after discharge. Follow up CBC showed anemia of chronic disease to be stable. Abnormal LFTs are resolving and SCr is stable around 1.3.   Voltaren  gel added to shoulder to  help with local measures. OIC has been managed with adjustment of laxatives. Her blood pressures were monitored on TID basis and home torsemide  was resumed due to increase in peripheral edema. She has had issues with urinary frequency and urgency.   Voiding function was monitored with PVR checks and showed no significant signs of retention. Ditropan  was added with improvement in symptoms but changed to Myrbetriq   to avoid sedative SE.   Ertapenum was switched to cipro  on 4/10 due to elevated LFTs and daptomycin  was changed to Rocephin  on 4/11 was tolerating this without SE. Abdominal ultrasound done for work up of abnormal LFTs and was negative for biliary dilatation and questioned cirrhosis due to nodularity of liver.  Her diabetes has been monitored with ac/hs CBG checks and SSI was use prn for tighter BS control. Po intake has improved and lantus  was titrated up to 40 units and she was advised to use her home SSI after discharge. Husband to place her Jerrilyn Moras 3 at home and she has been instructed on  importance of eating consistently during the day to avoid hypoglycemia at home. Fever blisters were treated with Valtrex . Pain control is improving and she has made gains during her stay. She requires supervision to CGA at wheelchair level. She will continue to receive follow up HHPT, HHOT , HH Aide, HHST and HHRN by [facility] after discharge.     Rehab course: During patient's stay in rehab weekly team conferences were held to monitor patient's progress, set goals and discuss barriers to discharge. At admission, patient required max to total assist with mobility and max assist with ADL tasks. She  has had improvement in activity tolerance, balance, postural control as well as ability to compensate for deficits. He/She has had improvement in functional use RLE and LLE. She requires min assist with ADL tasks.  She requires min to CGA for tranfers and CGA to ambulate 10' with use of RW. Family education has been completed with husband.     Discharge disposition: 06-Home-Health Care Svc  Diet: Carb Modified  Special Instructions: Wear TLSO when at edge of bed/out of bed. No  bending, twisting or arching Continue IV antibiotic with EOT 02/21/24. Follow up with ID prior to end of treatment. Monitor BS ac/hs and follow up PCP for adjustment of medications.   Discharge Instructions     [facility] pharmacist to adjust dose for Vancomycin , Aminoglycosides and other anti-infective therapies as requested by physician.   Complete by: As directed    [facility] to provide Cath Flo 2mg    Complete by: As directed    Administer for PICC line occlusion and as ordered by physician for other access device issues.   Ambulatory referral to Infectious Disease   Complete by: As directed    Needs follow up prior to EOT 02/21/24. Has seen Dr.Manadhar in the past   Ambulatory referral to Physical Medicine Rehab   Complete by: As directed    Anaphylaxis Kit: Provided to treat any  anaphylactic reaction to the medication being provided to the patient if First Dose or when requested by physician   Complete by: As directed    Epinephrine 1mg /ml vial / amp: Administer 0.3mg  (0.33ml) subcutaneously once for moderate to severe anaphylaxis, nurse to call physician and pharmacy when reaction occurs and call 911 if needed for immediate care   Diphenhydramine  50mg /ml IV vial: Administer 25-50mg  IV/IM PRN for first dose reaction, rash, itching, mild reaction, nurse to call physician and pharmacy when reaction occurs   Sodium Chloride  0.9% NS 500ml IV: Administer if needed for hypovolemic blood pressure drop or as ordered by physician after call to physician with anaphylactic reaction   Change dressing on IV access line weekly and PRN   Complete by: As directed    Flush IV access with Sodium Chloride  0.9% and Heparin  10 units/ml or 100 units/ml   Complete by: As directed    Home infusion instructions - [facility]   Complete by: As directed    Instructions: Flush IV access with Sodium Chloride  0.9% and Heparin  10units/ml or 100units/ml   Change dressing on IV access line: Weekly and PRN   Instructions Cath Flo 2mg : Administer for PICC Line occlusion and as ordered by physician for other access device   [facility] pharmacist to adjust dose for: Vancomycin , Aminoglycosides and other anti-infective therapies as requested by physician   Method of administration may be changed at the discretion of home infusion pharmacist based upon assessment of the patient and/or caregiver's ability to self-administer the medication ordered   Complete by: As directed       Allergies as of 02/01/2024       Reactions   Atorvastatin Nausea And Vomiting, Other (See Comments)   MYALGIAS   Oxycodone  Other (See Comments)   Confusion, staring episodes   Talwin [pentazocine] Other (See Comments)   headache   Cefepime  Other (See Comments)   Presumed AMS/neurotoxicity in the setting  of AKI   Gabapentin  Swelling   Other Nausea Only   UNSPECIFIED Anesthesia        Medication List     STOP taking these medications    carvedilol  6.25 MG tablet Commonly known as: COREG    FreeStyle Libre 3 Plus Sensor Misc   loratadine 10 MG tablet Commonly known as: CLARITIN   milk thistle 175 MG tablet   nitroGLYCERIN  0.4 MG SL tablet Commonly known as: NITROSTAT    oxyCODONE  5 MG immediate release tablet Commonly known as: Oxy IR/ROXICODONE        TAKE these medications    cefTRIAXone  IVPB Commonly known as: ROCEPHIN  Inject 2 g into  the vein daily. Indication:  C acnes discitis First Dose: Yes Last Day of Therapy:  02/20/24 Labs - Once weekly:  CBC/D and BMP, Labs - Once weekly: ESR and CRP Method of administration: IV Push Method of administration may be changed at the discretion of home infusion pharmacist based upon assessment of the patient and/or caregiver's ability to self-administer the medication ordered.   ciprofloxacin  500 MG tablet Commonly known as: CIPRO  Take 1 tablet (500 mg total) by mouth 2 (two) times daily.   diclofenac  Sodium 1 % Gel Commonly known as: VOLTAREN  Apply 2 g topically 4 (four) times daily.   DULoxetine  60 MG capsule Commonly known as: CYMBALTA  Take 60 mg by mouth at bedtime.   Hair Skin and Nails Formula Tabs Take 1 tablet by mouth daily.   HYDROcodone -acetaminophen  5-325 MG tablet--Rx # 28 pills. Commonly known as: NORCO/VICODIN Take 0.5-1 tablets by mouth every 6 (six) hours as needed for moderate pain (pain score 4-6) or severe pain (pain score 7-10). Notes to patient: Limit to 1/2 tablet twice a day AS NEEDED for severe pain   melatonin 5 MG Tabs Take 1 tablet (5 mg total) by mouth at bedtime as needed.   mirtazapine  30 MG tablet Commonly known as: REMERON  Take 30 mg by mouth at bedtime.   multivitamin with minerals Tabs tablet Take 1 tablet by mouth daily.   Myrbetriq  25 MG Tb24 tablet Generic drug:  mirabegron  ER Take 1 tablet (25 mg total) by mouth daily. Notes to patient: For hyperactive bladder   NovoLOG  ReliOn 100 UNIT/ML injection Generic drug: insulin  aspart Inject 2-20 Units into the skin 3 (three) times daily with meals. Notes to patient: Resume home regimen   pantoprazole  40 MG tablet Commonly known as: PROTONIX  Take 1 tablet (40 mg total) by mouth daily.   polyethylene glycol powder 17 GM/SCOOP powder Commonly known as: GLYCOLAX /MIRALAX  Take 17 g by mouth daily.   pramipexole  1 MG tablet Commonly known as: MIRAPEX  Take 1 mg by mouth at bedtime.   pregabalin  75 MG capsule Commonly known as: LYRICA  Take 1 capsule (75 mg total) by mouth 3 (three) times daily. What changed: when to take this   Senna-S 8.6-50 MG tablet Generic drug: senna-docusate Take 2 tablets by mouth 2 (two) times daily. Notes to patient: Laxative--can adjust as needed   tirzepatide 5 MG/0.5ML Pen Commonly known as: MOUNJARO Inject 5 mg into the skin once a week. Notes to patient: Resume at home   torsemide  20 MG tablet Commonly known as: DEMADEX  Take 40 mg by mouth daily.   Tresiba  FlexTouch 200 UNIT/ML FlexTouch Pen Generic drug: insulin  degludec Inject 40 Units into the skin daily with breakfast. What changed:  how much to take when to take this Notes to patient: Take 40 units daily. Need to eat consistently so that your blood sugars do not drop.                Discharge Care Instructions  (From admission, onward)           Start     Ordered   01/31/24 0000  Change dressing on IV access line weekly and PRN  (Home infusion instructions - [facility] )        01/31/24 1716            Follow-up Information     Dough, Ardeth Beckers, MD Follow up.   Specialty: Family Medicine Why: Call in 1-2 days for post hospital follow up Contact information:  [address] [place] [address] [phone]         Cherri Corns C, DO Follow up.   Specialty:  Physical Medicine and Rehabilitation Why: office will call you with follow up appointment Contact information: [address] [place] [address] [phone]         Joaquin Mulberry, MD. Call.   Specialty: Neurosurgery Why: today or Monday for post hospital follow up Contact information: [address] [place] [address] [phone]         Terre Ferri, MD. Call.   Specialty: Infectious Diseases Why: Monday for follow up appointment Contact information: [address] [place] [address] [phone]                 Signed: Zelda Hickman 02/03/2024, 10:30 AM

## 2024-02-01 NOTE — Progress Notes (Signed)
 PROGRESS NOTE   Subjective/Complaints:  Pt noted to have some mild confusion last night by nursing after robaxin , this AM back to baseline. She is looking forward to going home. Reports BM today.     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 + Urge incontinence-chronic, worsened postop- now improved  +LE edema +swallowing difficulty- chronic  + Chronic sleepiness-ongoing  Objective:   No results found.   Recent Labs    01/31/24 0512  WBC 4.0  HGB 8.6*  HCT 25.6*  PLT 169    No results for input(s): "NA", "K", "CL", "CO2", "GLUCOSE", "BUN", "CREATININE", "CALCIUM " in the last 72 hours.      Intake/Output Summary (Last 24 hours) at 02/01/2024 1452 Last data filed at 02/01/2024 4098 Gross per 24 hour  Intake 370 ml  Output --  Net 370 ml        Physical Exam: Vital Signs Blood pressure 132/73, pulse (!) 104, temperature 98.9 F (37.2 C), temperature source Oral, resp. rate 16, height 5\' 1"  (1.549 m), weight 90.4 kg, SpO2 97%.    General: awake, alert, appropriate, NAD, sitting in WC in her room  HENT: conjugate gaze; oropharynx moist CV: regular rate and rhythm Pulmonary: CTA B/L; non-labored, on RA GI: soft, NT, ND, (+)BS Psychiatric: Pleasant Neurological: Ox4   Skin: PICC line clean, dry, intact.  Small amount of blood around site.  Stable appearance. Back incision covered by TLSO.   MSK:      No apparent deformity. + RUE shoulder abduction/flexion limited to <70 degrees: Unchanged       +`TLSO Neurologic exam: Alert and oriented x 4, follows commands, no dysarthria.   Sensation: To light touch reduced in distal fingertips and toes bilaterally;  sensation improving at bilateral knees  CN: 2-12 grossly intact.  No hypertonia      Strength:                RUE: 5/5 SA, 5/5 EF, 5/5 EE, 5/5 WE, 5/5 FF, 5/5 FA                LUE:  5/5 SA, 5/5 EF, 5/5 EE, 5/5 WE, 5/5 FF, 5/5 FA                RLE: 4-/5 HF, 4-/5 KE, 4/5  DF, 4/5  EHL, 4/5  PF                 LLE:  4-/5 HF, 5/5 KE, 5/5  DF, /5  EHL, 5-/5  PF   No changes on exam 4-25  Assessment/Plan: 1. Functional deficits which require 3+ hours per day of interdisciplinary therapy in a comprehensive inpatient rehab setting. Physiatrist is providing close team supervision and 24 hour management of active medical problems listed below. Physiatrist and rehab team continue to assess barriers to discharge/monitor patient progress toward functional and medical goals  Care Tool:  Bathing    Body parts bathed by patient: Right arm, Chest, Abdomen,  Right upper leg, Left upper leg, Face, Left arm, Front perineal area, Buttocks, Right lower leg, Left lower leg   Body parts bathed by helper: Left arm, Front perineal area, Buttocks, Right lower leg, Left lower leg     Bathing assist Assist Level: Supervision/Verbal cueing (Use of BSC for increased periarea reach.)     Upper Body Dressing/Undressing Upper body dressing   What is the patient wearing?: Pull over shirt    Upper body assist Assist Level: Set up assist    Lower Body Dressing/Undressing Lower body dressing      What is the patient wearing?: Pants, Incontinence brief     Lower body assist Assist for lower body dressing: Minimal Assistance - Patient > 75%     Toileting Toileting    Toileting assist Assist for toileting: Minimal Assistance - Patient > 75%     Transfers Chair/bed transfer  Transfers assist     Chair/bed transfer assist level: Contact Guard/Touching assist     Locomotion Ambulation   Ambulation assist   Ambulation activity did not occur: Safety/medical concerns (weakness, pain, decreased  balance)  Assist level: Contact Guard/Touching assist Assistive device: Walker-rolling Max distance: 60'   Walk 10 feet activity   Assist  Walk 10 feet activity did not occur: Safety/medical concerns (weakness, pain, decreased balance)  Assist level: Contact Guard/Touching assist Assistive device: Walker-rolling   Walk 50 feet activity   Assist Walk 50 feet with 2 turns activity did not occur: Safety/medical concerns (weakness, pain, decreased balance)  Assist level: Contact Guard/Touching assist Assistive device: Walker-rolling    Walk 150 feet activity   Assist Walk 150 feet activity did not occur: Safety/medical concerns         Walk 10 feet on uneven surface  activity   Assist Walk 10 feet on uneven surfaces activity did not occur: Safety/medical concerns (weakness, pain, decreased balance)   Assist level: Minimal Assistance - Patient > 75% Assistive device: Walker-rolling   Wheelchair     Assist Is the patient using a wheelchair?: Yes Type of Wheelchair: Manual    Wheelchair assist level: Supervision/Verbal cueing Max wheelchair distance: 125 (Per PT note)    Wheelchair 50 feet with 2 turns activity    Assist        Assist Level: Independent   Wheelchair 150 feet activity     Assist      Assist Level: Minimal Assistance - Patient > 75% (per PT note)   Blood pressure 132/73, pulse (!) 104, temperature 98.9 F (37.2 C), temperature source Oral, resp. rate 16, height 5\' 1"  (1.549 m), weight 90.4 kg, SpO2 97%.  Medical Problem List and Plan: 1. 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  OK to stop continuous penicillin  G for showers.             -ELOS/Goals: 9-12 days, SPV PT/OT - 4/25 DC date             -con't CIR   - 4/8: Max a bed mobility, STEDY -  very fearful of falling. She did some marching at EOB today with the walker. Min A short distances goals. Mod A UB Max A LBD and toiletting; really limited by pain and ROM in her right arm.  4/15: Confusion has cleared per therapies. Bed mobility Mod A, transfers Mod A with SPT. Walking about 15 feet with Min A. SPV with WC. Husband has been very involved and very helpful. She has some ataxia and endurance difficulty. Setup to Min A with UB care, limited by R shoulder pain. Doing well with reacher.   DC home today   2.  Antithrombotics: -DVT/anticoagulation:  Mechanical: Sequential compression devices, below knee Bilateral lower extremities  -01/13/24 DVT US  neg              -antiplatelet therapy: N/A 3. Acute on chronic pain/Pain Management:  Was on Oxycodone  5 mg and Lyrica  75 mg daily PTA.  --Having surgical pain--continue Oxycodone  prn. Pain worse at nights-->will schedule oxycodone  at bedtime.  -Voltaren  gel QID -4/7: DC morphine  due to no utilization - 4/8: Oxy scheduled overnight and when taken in the a.m. causing some lethargy intermittently--patient has tolerated hydrocodone  in the past, will switch to Norco 2.5 to 5 mg every 6 hours as needed.  Will also schedule Tylenol  500 mg 3 times daily. - 4/9: LFTS up; DC scheduled tylenol . Add oxy to allergy list d/t confusion. Doing much better with Norco - 4/10: Having some a.m. confusion with p.m. Norco.  Has had issues with tramadol in the past.  Patient is already using rarely, advised to continue this regimen and wean off as tolerated. 4-14: No further confusion reported.  DC naproxen  as below. 4/19- still having pain, but feels meds are adequate 4/21  Pregabalin  increased  100mg  TID from 75mg  TID- likely max dose with her renal function  4/23 PDMP review indicates she takes oxycodone  at home 4/24 will decrease Lyrica  back to 75 mg, may be contributing to to sedation 4/25 stop robaxin  4. Mood/Behavior/Sleep: LCSW to folow for evaluation  and support.              -antipsychotic agents: N/A  -Cymbalta  60mg  nightly, mirapex  1mg  at bedtime, mirtazapine  30mg  nightly  -Patient reports sleeping well  5. Neuropsych/cognition: This patient is capable of making decisions on his own behalf.  4/8: Having increased confusion, staring episodes without new neurologic deficits.  Adjusting pain medications as above.  Has had issues with hallucinations/confusion with cefepime  in the past, would consider antibiotic adjustment if alternative causes ruled out.  4/9: episodes resolved with pain medication changes  6. Skin/Wound Care: Routine pressure relief measures. Monitor incision for healing.   4/7: Neurosurgery removed Hemovac over the weekend, current VAC with no drainage, if no further output in 24 hours will discuss removal with Dr. Philip Bravo removal in 2 days per Dr. Rochelle Chu  Wound VAC removed 4-10- -4-11: Limited visualizations due to TLSO when out of bed, nursing to place picture of wound in chart today.  7. Fluids/Electrolytes/Nutrition: Monitor I/O. Routine labs  -01/12/24 CMP stable today, Cr down to 1.55, AST 44, improving, monitor  4-7: LFTs mildly up trending, BUN and creatinine down, mild hyponatremia.  Limit Tylenol  to 3000 mg daily as above.  Repeat in 2 to 3 days.  4/9: LFTS continue up; NA 131. See below  4/22 LFTS improved, Hx of cirrhosis noted in chart review    8. Neuropathy/band like pain around waist: Will increase Lyrica  to TID 9. Osteomyelitis/discitis. Pending PICC placement. -- Starting 6 weeks of daptomycin  and ertapenem  for 6 weeks through 02/20/24 to be followed by PO suppression doxycycline  and ciprofloxacin .  -01/13/24 Cx NGTD x4d--> 1 of 2 cultures grew cutibacterium acnes--covered with current antibiotics 4-7: ESR added on  to a.m. labs.  CRP up today; discussed with ID, no new orders. 4-10: Infectious disease changing ertapenem  to Cipro  due to LFT elevations as below.  Continue daptomycin ; has had issues with  vancomycin  in the past 4-11: Daptomycin  switched to continuous penicillin  G yesterday afternoon.  Continuing Cipro .  Per infectious disease/pharmacy looking into insurance coverage for continuous penicillin  G cassette versus switching to Rocephin  prior to discharge.  If switching to Rocephin , asked to be informed a few days prior so we could trial it at rehab, given significant side effects to cefepime  in the past (more likely related to accumulation with AKI - not true allergy). 4/14: tolerating rocephin  since Friday Discussed importance of continuing antibiotics as directed again today, f/u outpatient ID and NSGY  10. HTN/edema:  Monitor BP TID--on torsemide  40mg  daily and coreg  6.125mg  BID (not reordered?). -4/5-6/25 BPs mostly fine, coreg  not ordered as of now; remain off for now 4-7: Systolic low, p.o. intakes minimal.  Reduce torsemide  for tomorrow a.m. to 20 mg, and encourage p.o. fluids today. 4-8: Diastolic remains low, will DC torsemide .  Schedule daily weights. 4/9: BP up a bit, continue to hold torsemide   4-10: Increased lower extremity edema, remains with diastolic hypotension but wishes to resume home torsemide ; will resume at 20 mg daily.  Continue teds 4-11: Peripheral edema improved, patient tolerating well.  Continue current medications. -4/12-13/25 BPs great, monitor 4/14: Diastolic soft, but stable. Monitor with therapies.  4/15: Increasing peripheral edema; diastolic remains soft but asymptomatic.  Increase torsemide  back to 40 mg daily, will give extra 20 mg dose today to catch up.  Monitor weights. 4-16: Weights down significantly.  BP tolerating current dose of torsemide .  Continue current medications. 4-18: Weights today likely inaccurate.  Peripheral edema stable.  No shortness of breath.  Vital stable.  Monitor. 4/19- Weight stable from 2 days ago- con't to monitor 4/20- weight 86.3- has been lower than normal- by 2 kg- appears a little lower than expected- will  mointor 4/24 BP overall controlled, weight a little bit higher today but do not think this was an accurate measurement 4/25 wt stable today F/u with PCP for continued monitoring Vitals:   01/28/24 2034 01/29/24 0455 01/29/24 1507 01/29/24 2003  BP: (!) 128/59 136/60 (!) 124/54 (!) 137/58   01/30/24 0438 01/30/24 1553 01/30/24 1940 01/31/24 0302  BP: (!) 152/68 130/60 128/69 (!) 141/57   01/31/24 1611 01/31/24 1949 02/01/24 0517 02/01/24 0833  BP: 138/64 (!) 135/58 (!) 145/56 132/73    Filed Weights   01/31/24 0500 02/01/24 0510 02/01/24 0517  Weight: 91.9 kg 95.9 kg 90.4 kg    11. T2DM: Hgb A1C-7.9. Used Tresiba  50-120/ 5 units ac meal TID at home.              --Continue insulin  glargine 20 units daily with SSI.  -4/5-6/25 CBGs a bit better this weekend, monitor trend but may need further adjustments since they're still mostly >200 4/7: Blood sugars remain elevated, increase glargine to 25 units daily starting tomorrow a.m. 4/11: Blood sugars uptrending with improved p.o.; resume home standing short acting insulin , started 3 units 3 times daily WC -4/14: CBG elevated, seems to have been better overall over the weekend so we will monitor for 24 hours and if remains consistently elevated will adjust insulin  4-15: Blood sugars still tending high, with highs to this a.m., increase glargine to 30 units daily-- monitor increase 4-16 4/17: Increase Semglee  to 35 units.  Increase Premeal insulin  to 5 units  standing--May need Semglee  further increased over the weekend 4/19- increased Lantus  to 38 units due to BG's still in low 200's 4/20- CBG this AM was 164- down and much better than prior to change in Lantus  4/21 monitor CBGs today, continue increase lantus  tomorrow  4/22 increase mealtime aspart to 7u  4/24 Increase Lantus  to 40u 4/25 CBGs fair, f/u with [facility] CBG (last 3)  Recent Labs    01/31/24 2103 02/01/24 0527 02/01/24 1116  GLUCAP 294* 160* 205*    12.  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  -01/12/24 Cr 1.55 today, down from 1.86 yesterday, monitor Monday  4-7, 4/9: BUN, creatinine improving. 4-11: BUN stable, creatinine slightly up to 1.26; monitor on Monday, may need to DC naprosyn  if up trended -01/19/24 Cr up to 1.34, still better than prior and near her baseline; will decrease naprosyn  to 250mg  BID for now, monitor on Monday labs.  4/14, 4/18: Cr stable 1.3-1.4 since resumption of home torsemide . 4/21 BUN/CR stable, continue current regimen  4/24 BUN/creatinine overall stable at 25/1.37  13. Fever blisters: Will start valtrex  1g BID x1wk as symptomatic. Improving, EOT 4/11  14. Right shoulder pain: Question strain but heard a pop this am and extremely concerned that she may have torn her RTC again --Voltaren  gel for local measures.  -- Consider MRI if no improvement 4/8: R shoulder remains very painful/limited - will get x-ray and consider MRI if no acute findings. 4/9: Xray with mild changes; discussed with patient, unable to get MRI due to spinal cord stimulator leads, will order CT 4/10: CT nondiagnostic, some changes "suggestive of rotator cuff tear", mild before meals and glenohumeral degenerative changes.  Consulted orthopedics, appreciate Steffanie Edouard PA-C's evaluation, recommending no intervention at this time, added naproxen  500 mg twice daily. 4-14: Minimal improvements with naproxen , will DC to CKD as above   15. Constipation: Continue Senna 2 tabs daily--miralax  was added on 04/04 -01/12/24 LBM yesterday, didn't get the 32oz miralax  so d/c'd. Continue miralax  17g daily for now.  -01/13/24 LBM last night, cont regimen as is for now, adjust accordingly 4-7: Increase Senokot-S to 2 tabs twice daily, MiraLAX  BID 01/24/24 LBM   4/19- LBM yesterday 4/25 LBM today per patient report  16. Depression/Anxiety: Continue Cymbalta  60mg  nightly and Mirtazapine  30mg  nightly.    17. Neurogenic  bladder/urge incontinence: Continue foley. D/c in am if has results with laxative tonight. -01/12/24 had BM last night, will pull foley, do timed toileting and PVRs q4-6h, ISC if PVR >331ml -01/13/24 urinating well, low PVRs, continue checking but could d/c tomorrow if still urinating fine.  4-7: Single episode of incontinence overnight, with PVR 170s.  Otherwise continent.  Continue current regimen. - 4/16: start ditropan  2.5 mg BID. Q6H bladder scans 4-17: Increase Ditropan  to 5 mg twice daily. 4/18: Bladder scan 0, increase Ditropan  to 5 mg 4 times daily.  No fevers, dysuria or leukocytosis to indicate UTI. 4/19- pt wants to increase- I don't feel that's appropriate considering at the max dose of Oxybutynin  I would do for a 68 yr old 4/23 Pt reports this is doing better, continent 4/25 remains continent, cont myrbetriq  18. ABLA: Hgb 10.5 01/12/24, monitor  -4-7: Hemoglobin down from 10.5-9.4; no overt signs of bleeding, repeat in 1 to 2 days  4-9: Hemoglobin stable, 8.8 -01/20/24 Hgb 8.1 on 4/11 but up to 8.3 on 4/12; monitor tomorrow since still fairly stable.   -4/17 stable  19.  GERD: reordered Protonix  40mg  daily -4-10: Increase Protonix  to 40 mg twice daily for GI prophylaxis with antibiotics and now naprosyn  4-14: Will reduce Protonix  back to once daily with DC naproxen  4/22 increase protonix  to BID  20.  Transaminitis.  Uptrending on 4-7 labs.  -May be stasis related due to poor mobility; Tylenol  utilization within parameters  -Repeat in 2 days  4/9: Increasing. DC standing tylenol . Get liver US  today, + GGT. Per pharmacy medications unlikely contributors.   4-10: Liver ultrasound without acute findings.  Continues to uptrend; discussed with pharmacy, infectious disease, feel this is close enough to her baseline elevations but no concern but will change ertapenem  to Cipro .  4/11:  LFTs have stabilized.  Monitor.  -01/19/24 LFTs fairly stable overall, will change CMP to Monday instead of  daily through the weekend. --ALP continues to uptrend, others approximately stable.  - 4/18: LFTs stable/downtrending.  4/22 improved, she has hx of cirrhosis  4/24 continues to improve, follow-up with PCP 21. Hyponatremia.   - ?etiology; possible with abx but unlikely   - Holding torsemide  as above   - Add 1200 cc fluid restriciton   - Serum OSM + urine OSM, NA 4/9   - repeat in AM  4-10: Improved today, 132.  Will need to monitor closely with resumption of torsemide .  Add labs in a.m.  01/19/24 stable 133  4-14, 4/18: stable 22. Sedation/sleepiness  4/20- I think it's Oxybutynin  that's cuasing sedation- I suggest moving to Myrbetriq  since causes less sedation- but don't want to make change without d/w primary physician- will leave to Dr Dorn Gaskins.   -4/24 change oxybutynin  to myrbetriq  to see if it helps sedation. Also sounds like pt used CPAP/BIPIP years ago for sleep apnea, she reports she was not tolerating. She likely needs to f/u with her sleep doctor  -DC robaxin  23. Dysphagia  -PPI increase to BID, SLP consult  -F/U with GI provider     LOS: 21 days A FACE TO FACE EVALUATION WAS PERFORMED  Lylia Sand 02/01/2024, 2:52 PM

## 2024-02-01 NOTE — Progress Notes (Signed)
 Significant other of patient in the room this AM. Provider spook with patient and SO. Given pain medication "6 out of 10" pain. CBG was obtained verified with Provider if to cover current CBG result. Explained to inform patient to take insulin  sliding scale medication when home when ready to eat. TOC medication with patient. Assisted downstairs and brought to own personal vehicle.

## 2024-02-01 NOTE — Plan of Care (Signed)
  Problem: Consults Goal: RH SPINAL CORD INJURY PATIENT EDUCATION Description:  See Patient Education module for education specifics.  Outcome: Progressing   Problem: Consults Goal: Skin Care Protocol Initiated - if Braden Score 18 or less Description: If consults are not indicated, leave blank or document N/A Outcome: Progressing   Problem: Consults Goal: Nutrition Consult-if indicated Outcome: Progressing   Problem: Consults Goal: Diabetes Guidelines if Diabetic/Glucose > 140 Description: If diabetic or lab glucose is > 140 mg/dl - Initiate Diabetes/Hyperglycemia Guidelines & Document Interventions  Outcome: Progressing   Problem: SCI BOWEL ELIMINATION Goal: RH STG MANAGE BOWEL WITH ASSISTANCE Description: STG Manage Bowel with minimal  Assistance. Outcome: Progressing   Problem: SCI BOWEL ELIMINATION Goal: RH STG SCI MANAGE BOWEL WITH MEDICATION WITH ASSISTANCE Description: STG SCI Manage bowel with medication with minimal assistance. Outcome: Progressing   Problem: SCI BLADDER ELIMINATION Goal: RH STG MANAGE BLADDER WITH ASSISTANCE Description: STG Manage Bladder With minimal Assistance Outcome: Progressing

## 2024-02-03 NOTE — Discharge Summary (Incomplete)
 Physician Discharge Summary  Patient ID: Katelyn [name] MRN: [id number] DOB/AGE: [birth year] 68 y.o.  Admit date: 01/11/2024 Discharge date: 02/03/2024  Discharge Diagnoses:  Principal Problem:   Thoracic myelopathy Active Problems:   Type 2 diabetes mellitus without complication, with long-term current use of insulin  (HCC)   Chronic pain syndrome  Hyperactive bladder  Right shoulder DJD Acute on chronic pain Neuropathy Restless leg syndrome Abnormal LFTs Hyponatremia. Hypertension. Acute on chronic kidney disease. Fever blisters Constipation. Acute blood loss anemia GERD Neurogenic bladder.   Discharged Condition: stable  Significant Diagnostic Studies: CT SHOULDER RIGHT WO CONTRAST Result Date: 01/17/2024 CLINICAL DATA:  Concern for rotator cuff tear. Patient unable to receive MRI due to spinal cord stimulator leads. EXAM: CT OF THE UPPER RIGHT EXTREMITY WITHOUT CONTRAST TECHNIQUE: Multidetector CT imaging of the upper right extremity was performed according to the standard protocol. RADIATION DOSE REDUCTION: This exam was performed according to the departmental dose-optimization program which includes automated exposure control, adjustment of the mA and/or kV according to patient size and/or use of iterative reconstruction technique. COMPARISON:  Right shoulder radiographs dated 01/15/2024. FINDINGS: Evaluation of the rotator cuff, ligaments, and tendons is limited on this CT examination. Bones/Joint/Cartilage No acute fracture. Superior migration of the humeral head with reduction of the acromial humeral interval. Subcortical cystic changes and irregularity of the greater tuberosity. Glenohumeral joint effusion with a tiny loose bodies in the anterior joint space. Suspected fluid in the subacromial/subdeltoid bursa. Mild degenerative changes of the glenohumeral joint with asymmetric joint space narrowing and small marginal osteophytes. There is anterior decentering of the humeral head  relative to the glenoid. The acromioclavicular joint is anatomically aligned with mild-to-moderate degenerative changes. Partially visualized fusion hardware at the level of the mid to lower thoracic spine. Partially visualized spinal stimulator lead terminates at the T6 level. Ligaments Ligaments are suboptimally evaluated by CT. Muscles and Tendons No significant muscle atrophy. Soft tissue Partially visualized right upper extremity PICC. No enlarged lymph nodes identified in the field of view. IMPRESSION: 1. Evaluation of the rotator cuff, ligaments, and tendons is limited on this CT examination. Within these limitations, there are secondary/indirect signs, as described above, suggestive of rotator cuff tear/chronic rotator cuff pathology. Further evaluation with CT arthrogram could be considered. 2. Mild degenerative changes of the glenohumeral joint with joint effusion. 3. Mild-to-moderate degenerative changes of the acromioclavicular joint. 4. Partially visualized fusion hardware at the level of the mid to lower thoracic spine. Electronically Signed   By: Mannie Seek M.D.   On: 01/17/2024 08:41   US  Abdomen Limited RUQ (LIVER/GB) Result Date: 01/17/2024 CLINICAL DATA:  Elevated LFT EXAM: ULTRASOUND ABDOMEN LIMITED RIGHT UPPER QUADRANT COMPARISON:  CT 03/13/2023 FINDINGS: Gallbladder: Surgically absent Common bile duct: Diameter: 3.4 mm Liver: Echogenicity within normal limits. Suspicion of surface nodularity of the liver. No focal hepatic abnormality is seen. Portal vein is patent on color Doppler imaging with normal direction of blood flow towards the liver. Other: None. IMPRESSION: 1. Status post cholecystectomy. No biliary dilatation. 2. Suspicion of surface nodularity of the liver, correlate for cirrhosis. Electronically Signed   By: Esmeralda Hedge M.D.   On: 01/17/2024 00:56   DG Shoulder Right Result Date: 01/15/2024 CLINICAL DATA:  Acute pain of right shoulder due to trauma. EXAM: RIGHT  SHOULDER - 2+ VIEW COMPARISON:  None Available. FINDINGS: There is no evidence of fracture or dislocation. Mild degenerative change. Subcortical cystic change in the lateral humeral head. Soft tissues are unremarkable. Right upper extremity PICC is  partially included in the field of view. IMPRESSION: 1. No fracture or dislocation of the right shoulder. 2. Mild degenerative change. Electronically Signed   By: Chadwick Colonel M.D.   On: 01/15/2024 18:57   VAS US  LOWER EXTREMITY VENOUS (DVT) Result Date: 01/13/2024  Lower Venous DVT Study Patient Name:  Katelyn [name]  Date of Exam:   01/13/2024 Medical Rec #: [id number]    Accession #:    [id number] Date of Birth: Nov 08, 1955    Patient Gender: F Patient Age:   68 years Exam Location:  [facility] Procedure:      VAS US  LOWER EXTREMITY VENOUS (DVT) Referring Phys: Andrya Roppolo --------------------------------------------------------------------------------  Indications: Edema. Other Indications: Rehab patient. Risk Factors: Back surgery on 01/09/2024, decreased mobility. Comparison Study: Previous exam on 05/12/2019 was negative for DVT Performing Technologist: Arlyce Berger RVT, RDMS  Examination Guidelines: A complete evaluation includes B-mode imaging, spectral Doppler, color Doppler, and power Doppler as needed of all accessible portions of each vessel. Bilateral testing is considered an integral part of a complete examination. Limited examinations for reoccurring indications may be performed as noted. The reflux portion of the exam is performed with the patient in reverse Trendelenburg.  +---------+---------------+---------+-----------+----------+--------------+ RIGHT    CompressibilityPhasicitySpontaneityPropertiesThrombus Aging +---------+---------------+---------+-----------+----------+--------------+ CFV      Full           Yes      Yes                                 +---------+---------------+---------+-----------+----------+--------------+ SFJ       Full                                                        +---------+---------------+---------+-----------+----------+--------------+ FV Prox  Full           Yes      Yes                                 +---------+---------------+---------+-----------+----------+--------------+ FV Mid   Full           Yes      Yes                                 +---------+---------------+---------+-----------+----------+--------------+ FV DistalFull           Yes      Yes                                 +---------+---------------+---------+-----------+----------+--------------+ PFV      Full                                                        +---------+---------------+---------+-----------+----------+--------------+ POP      Full           Yes      Yes                                 +---------+---------------+---------+-----------+----------+--------------+  PTV      Full                                                        +---------+---------------+---------+-----------+----------+--------------+ PERO     Full                                                        +---------+---------------+---------+-----------+----------+--------------+   +---------+---------------+---------+-----------+----------+--------------+ LEFT     CompressibilityPhasicitySpontaneityPropertiesThrombus Aging +---------+---------------+---------+-----------+----------+--------------+ CFV      Full           Yes      Yes                                 +---------+---------------+---------+-----------+----------+--------------+ SFJ      Full                                                        +---------+---------------+---------+-----------+----------+--------------+ FV Prox  Full           Yes      Yes                                 +---------+---------------+---------+-----------+----------+--------------+ FV Mid   Full           Yes      Yes                                  +---------+---------------+---------+-----------+----------+--------------+ FV DistalFull           Yes      Yes                                 +---------+---------------+---------+-----------+----------+--------------+ PFV      Full                                                        +---------+---------------+---------+-----------+----------+--------------+ POP      Full           Yes      Yes                                 +---------+---------------+---------+-----------+----------+--------------+ PTV      Full                                                        +---------+---------------+---------+-----------+----------+--------------+  PERO     Full                                                        +---------+---------------+---------+-----------+----------+--------------+     Summary: BILATERAL: - No evidence of deep vein thrombosis seen in the lower extremities, bilaterally. -No evidence of popliteal cyst, bilaterally.   *See table(s) above for measurements and observations. Electronically signed by Genny Kid MD on 01/13/2024 at 10:44:45 AM.    Final    US  EKG SITE RITE Result Date: 01/11/2024 If Site Rite image not attached, placement could not be confirmed due to current cardiac rhythm.  DG Thoracic Spine 2 View Result Date: 01/09/2024 CLINICAL DATA:  Elective surgery. EXAM: THORACIC SPINE 2 VIEWS COMPARISON:  Preoperative imaging FINDINGS: Five fluoroscopic spot views from the operating room submitted. Portions of thoracolumbar fusion hardware visualized, now extending cranially into the T7 level. Fluoroscopy time 74.5 seconds. Dose 26.05 mGy. Portions of a spinal stimulator are seen. IMPRESSION: Intraoperative fluoroscopy during thoracic spine surgery. Electronically Signed   By: Chadwick Colonel M.D.   On: 01/09/2024 18:20   DG C-Arm 1-60 Min-No Report Result Date: 01/09/2024 Fluoroscopy was utilized by the requesting physician.   No radiographic interpretation.   DG C-Arm 1-60 Min-No Report Result Date: 01/09/2024 Fluoroscopy was utilized by the requesting physician.  No radiographic interpretation.   DG C-Arm 1-60 Min-No Report Result Date: 01/09/2024 Fluoroscopy was utilized by the requesting physician.  No radiographic interpretation.   CT THORACIC SPINE W CONTRAST Result Date: 01/08/2024 CLINICAL DATA:  Mid back pain.  Myelogram.  Osteomyelitis. EXAM: CT THORACIC SPINE WITH CONTRAST TECHNIQUE: Multidetector CT images of thoracic was performed according to the standard protocol following intrathecal contrast administration. RADIATION DOSE REDUCTION: This exam was performed according to the departmental dose-optimization program which includes automated exposure control, adjustment of the mA and/or kV according to patient size and/or use of iterative reconstruction technique. CONTRAST:  10mL OMNIPAQUE  IOHEXOL  300 MG/ML  SOLN COMPARISON:  Thoracic spine CT 01/08/2024, 01/07/2024 FINDINGS: Alignment: Unchanged grade 1 retrolisthesis at T11-12. Vertebrae: Erosive endplate changes at T9-10 and T11-12, unchanged. Lucency about the T10 transpedicular screws, greater on the left. Paraspinal and other soft tissues: Edema within the paraspinal soft tissues at T9-10. Disc levels: The thecal sac is severely attenuated at the T9-10 level by soft tissue density material, likely epidural phlegmon/abscess. Thecal sac caliber is normal below the T9-10 level. There is right asymmetric clumping of the nerve roots within the upper lumbar spinal canal. IMPRESSION: 1. Severe thecal sac attenuation at the T9-10 level by soft tissue density material, likely epidural phlegmon/abscess. 2. 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 3. Unchanged endplate erosion at T9-10 and T11-12 Electronically Signed   By: Juanetta Nordmann M.D.   On: 01/08/2024 20:43   CT THORACIC SPINE WO CONTRAST Result  Date: 01/08/2024 CLINICAL DATA:  Mid back pain. 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 Previous thoracolumbar fusion and thoracic spinal stimulator placement. EXAM: CT THORACIC SPINE WITHOUT CONTRAST TECHNIQUE: Multidetector CT images of the thoracic were obtained using the standard protocol without intravenous contrast. RADIATION DOSE REDUCTION: This exam was performed according to the departmental dose-optimization program which includes automated exposure control, adjustment of the mA and/or kV according to patient size and/or use of iterative reconstruction technique. COMPARISON:  CT thoracic spine 01/07/2024, 12/19/2023 and 02/14/2023. FINDINGS: Alignment: Stable chronic retrolisthesis at T11-12. Vertebrae: Status post extensive thoracolumbar fusion extending inferiorly from T10. There is loosening of the T10 pedicle screws, greater on the left. No hardware displacement identified. The interconnecting rods are intact. Irregular endplate destruction at T9-10 again noted consistent with discitis/osteomyelitis. Sequela of remote discitis and osteomyelitis at T11-12 appears unchanged. There is chronic ankylosis across the L1-2 disc. Spinal stimulator remains in place at T6-7. Paraspinal and other soft tissues: Unchanged paraspinal inflammatory changes at T9-10 without focal fluid collection. Disc levels: Unchanged multilevel thoracic spondylosis with disc space narrowing and endplate osteophytes. No evidence of large disc herniation, significant spinal stenosis or foraminal narrowing in the upper thoracic spine. There is a mass effect on the thecal sac at T9-10 with moderate foraminal narrowing, greater on the left. Moderate chronic osseous foraminal narrowing on the left at T11-12 appears unchanged. IMPRESSION: 1. No change from thoracic spine CT of the previous day. 2. Unchanged appearance of discitis/osteomyelitis at T9-10 with paraspinal inflammatory changes. No focal fluid collection identified. There is mass effect on  the thecal sac with left greater than right foraminal narrowing. Unchanged loosening of the T10 pedicle screws, greater on the left. 3. Unchanged appearance of remote discitis and osteomyelitis at T11-12. 4. No acute osseous findings. 5. Unchanged multilevel thoracic spondylosis. Electronically Signed   By: Elmon Hagedorn M.D.   On: 01/08/2024 17:02   DG FL GUIDED LP INJECT MYELOGRAM THORACIC Result Date: 01/08/2024 CLINICAL DATA:  Thoracolumbar posterior fusion, thoracic spondylosis with myelopathy FLUOROSCOPY: 1 minute 36 seconds, 38.1 mg PROCEDURE: LUMBAR PUNCTURE FOR THORACIC MYELOGRAM After thorough discussion of risks and benefits of the procedure including bleeding, infection, injury to nerves, blood vessels, adjacent structures as well as headache and CSF leak, written and oral informed consent was obtained. Consent was obtained by Dr. Melven Stable. Shick. Patient was positioned prone on the fluoroscopy table. Local anesthesia was provided with 1% lidocaine  without epinephrine after prepped and draped in the usual sterile fashion. Puncture was performed at L1-2 using a 3 1/2 inch 20 gauge spinal needle via midline approach. Using a single pass through the dura, the needle was placed within the thecal sac, with return of clear CSF. 10 mL of omni 300 was injected into the thecal sac, with normal opacification of the nerve roots and cauda equina consistent with free flow within the subarachnoid space. The patient was then moved to the trendelenburg position and contrast flowed into the Thoracic spine region. I personally performed the lumbar puncture and administered the intrathecal contrast. I also personally supervised acquisition of the myelogram images. TECHNIQUE: Spot fluoroscopic imaging performed for documentation. FINDINGS: THORACIC MYELOGRAM FINDINGS: Imaging confirms needle placement at the L1-2 level. Clear CSF obtained. Successful thecal injection of the 10 cc Omnipaque  300 contrast. Good dispersal  initially in the lumbar region. Trendelenburg position allowed good dispersal throughout the thoracic region. Next, thoracic CT imaging will be performed. IMPRESSION: Successful lumbar puncture for thoracic myelogram. Electronically Signed   By: Melven Stable.  Shick M.D.   On: 01/08/2024 16:55   CT THORACIC SPINE W CONTRAST Result Date: 01/07/2024 CLINICAL DATA:  Mid back pain EXAM: CT THORACIC SPINE WITH CONTRAST TECHNIQUE: Multidetector CT images of thoracic was performed according to the standard protocol following intravenous contrast administration. RADIATION DOSE REDUCTION: This exam was performed according to the departmental dose-optimization program which includes automated exposure control, adjustment of the mA and/or kV according to patient size and/or use of iterative reconstruction technique.  CONTRAST:  75mL OMNIPAQUE  IOHEXOL  350 MG/ML SOLN COMPARISON:  12/19/2023 FINDINGS: Alignment: Grade 1 retrolisthesis at T11-12 Vertebrae: Erosive endplate changes at T9-10 and T11-12 are unchanged since 12/19/2023. Lucency about the left T10 transpedicular screws unchanged. Paraspinal and other soft tissues: Spinal cord stimulator enters the epidural space at the T8-9 level. There is edema within the soft tissues surrounding the T9 and T10 vertebral bodies. Disc levels: There is poorly defined epidural contrast enhancement at the T9-10 level. IMPRESSION: 1. Erosive endplate changes at T9-10 and T11-12 are unchanged since 12/19/2023 and remain concerning for discitis-osteomyelitis. 2. Poorly defined epidural contrast enhancement at the T9-10 level, concerning for epidural abscess. MRI with and without contrast would be more definitive. 3. Lucency about the left T10 transpedicular screws unchanged. Electronically Signed   By: Juanetta Nordmann M.D.   On: 01/07/2024 22:27    Labs:  Comprehensive Metabolic Panel:    Latest Ref Rng & Units 01/28/2024    5:12 AM 01/25/2024    4:00 AM 01/21/2024    4:11 AM  CMP  Glucose 70 -  99 mg/dL 846  962  952   BUN 8 - 23 mg/dL 25  29  26    Creatinine 0.44 - 1.00 mg/dL 8.41  3.24  4.01   Sodium 135 - 145 mmol/L 132  135  135   Potassium 3.5 - 5.1 mmol/L 3.8  3.8  4.4   Chloride 98 - 111 mmol/L 95  98  98   CO2 22 - 32 mmol/L 28  28  27    Calcium  8.9 - 10.3 mg/dL 8.6  8.5  8.7   Total Protein 6.5 - 8.1 g/dL 6.0  6.0  5.8   Total Bilirubin 0.0 - 1.2 mg/dL 0.5  0.4  0.3   Alkaline Phos 38 - 126 U/L 263  300  321   AST 15 - 41 U/L 36  57  65   ALT 0 - 44 U/L 37  52  67      CBC:    Latest Ref Rng & Units 01/31/2024    5:12 AM 01/28/2024    5:12 AM 01/24/2024    4:00 AM  CBC  WBC 4.0 - 10.5 K/uL 4.0  4.1  3.8   Hemoglobin 12.0 - 15.0 g/dL 8.6  8.2  8.2   Hematocrit 36.0 - 46.0 % 25.6  25.7  25.1   Platelets 150 - 400 K/uL 169  170  157      CBG: Recent Labs  Lab 01/31/24 1116 01/31/24 1659 01/31/24 2103 02/01/24 0527 02/01/24 1116  GLUCAP 212* 200* 294* 160* 205*    Brief HPI:   MARCIELA ARNTZ is a 68 y.o. female ***   Hospital Course: JACQUELINNE BRUMLOW was admitted to rehab 01/11/2024 for inpatient therapies to consist of PT, ST and OT at least three hours five days a week. Past admission physiatrist, therapy team and rehab RN have worked together to provide customized collaborative inpatient rehab. She was maintained on SQ lovenox  for DVT prophylaxis. BLE dopplers done were negative for DVT. Her back incision is C/D/I and healing well without s/s of infection. Oxycodone  has been used on prn basis for pain management but she has had intermittent lethargy with confusion due to this therefore this was changed to hydrocodone  prn. She was advised to use 1/2 to one tablet as needed for severe pain. She was unable to tolerate robaxin  due to confusion SE. Shoulder CT ordered for work up of shoulder pain  and showed evidence of RTC tear. Voltaren  gel added to help with pain. Her blood pressures were monitored on TID basis and has been stable. Follow up labs showed elevation in CRP  and daptomycin  was changed to Cipro .  Diabetes has been monitored with ac/hs CBG checks and SSI was use prn for tighter BS control.    Rehab course: During patient's stay in rehab weekly team conferences were held to monitor patient's progress, set goals and discuss barriers to discharge. At admission, patient required  He/She  has had improvement in activity tolerance, balance, postural control as well as ability to compensate for deficits. He/She has had improvement in functional use RUE/LUE  and RLE/LLE as well as improvement in awareness       Disposition:  Discharge disposition: 06-Home-Health Care Svc        Diet:  Special Instructions:  Discharge Instructions     [facility] pharmacist to adjust dose for Vancomycin , Aminoglycosides and other anti-infective therapies as requested by physician.   Complete by: As directed    [facility] to provide Cath Flo 2mg    Complete by: As directed    Administer for PICC line occlusion and as ordered by physician for other access device issues.   Ambulatory referral to Infectious Disease   Complete by: As directed    Needs follow up prior to EOT 02/21/24. Has seen Dr.Manadhar in the past   Ambulatory referral to Physical Medicine Rehab   Complete by: As directed    Anaphylaxis Kit: Provided to treat any anaphylactic reaction to the medication being provided to the patient if First Dose or when requested by physician   Complete by: As directed    Epinephrine 1mg /ml vial / amp: Administer 0.3mg  (0.84ml) subcutaneously once for moderate to severe anaphylaxis, nurse to call physician and pharmacy when reaction occurs and call 911 if needed for immediate care   Diphenhydramine  50mg /ml IV vial: Administer 25-50mg  IV/IM PRN for first dose reaction, rash, itching, mild reaction, nurse to call physician and pharmacy when reaction occurs   Sodium Chloride  0.9% NS 500ml IV: Administer if needed for hypovolemic blood  pressure drop or as ordered by physician after call to physician with anaphylactic reaction   Change dressing on IV access line weekly and PRN   Complete by: As directed    Flush IV access with Sodium Chloride  0.9% and Heparin  10 units/ml or 100 units/ml   Complete by: As directed    Home infusion instructions - [facility]   Complete by: As directed    Instructions: Flush IV access with Sodium Chloride  0.9% and Heparin  10units/ml or 100units/ml   Change dressing on IV access line: Weekly and PRN   Instructions Cath Flo 2mg : Administer for PICC Line occlusion and as ordered by physician for other access device   [facility] pharmacist to adjust dose for: Vancomycin , Aminoglycosides and other anti-infective therapies as requested by physician   Method of administration may be changed at the discretion of home infusion pharmacist based upon assessment of the patient and/or caregiver's ability to self-administer the medication ordered   Complete by: As directed       Allergies as of 02/01/2024       Reactions   Atorvastatin Nausea And Vomiting, Other (See Comments)   MYALGIAS   Oxycodone  Other (See Comments)   Confusion, staring episodes   Talwin [pentazocine] Other (See Comments)   headache   Cefepime  Other (See Comments)   Presumed AMS/neurotoxicity in  the setting of AKI   Gabapentin  Swelling   Other Nausea Only   UNSPECIFIED Anesthesia        Medication List     STOP taking these medications    carvedilol  6.25 MG tablet Commonly known as: COREG    FreeStyle Libre 3 Plus Sensor Misc   loratadine 10 MG tablet Commonly known as: CLARITIN   milk thistle 175 MG tablet   nitroGLYCERIN  0.4 MG SL tablet Commonly known as: NITROSTAT    oxyCODONE  5 MG immediate release tablet Commonly known as: Oxy IR/ROXICODONE        TAKE these medications    cefTRIAXone  IVPB Commonly known as: ROCEPHIN  Inject 2 g into the vein daily. Indication:  C acnes  discitis First Dose: Yes Last Day of Therapy:  02/20/24 Labs - Once weekly:  CBC/D and BMP, Labs - Once weekly: ESR and CRP Method of administration: IV Push Method of administration may be changed at the discretion of home infusion pharmacist based upon assessment of the patient and/or caregiver's ability to self-administer the medication ordered.   ciprofloxacin  500 MG tablet Commonly known as: CIPRO  Take 1 tablet (500 mg total) by mouth 2 (two) times daily.   diclofenac  Sodium 1 % Gel Commonly known as: VOLTAREN  Apply 2 g topically 4 (four) times daily.   DULoxetine  60 MG capsule Commonly known as: CYMBALTA  Take 60 mg by mouth at bedtime.   Hair Skin and Nails Formula Tabs Take 1 tablet by mouth daily.   HYDROcodone -acetaminophen  5-325 MG tablet Commonly known as: NORCO/VICODIN Take 0.5-1 tablets by mouth every 6 (six) hours as needed for moderate pain (pain score 4-6) or severe pain (pain score 7-10). Notes to patient: Limit to 1/2 tablet twice a day AS NEEDED for severe pain   melatonin 5 MG Tabs Take 1 tablet (5 mg total) by mouth at bedtime as needed.   mirtazapine  30 MG tablet Commonly known as: REMERON  Take 30 mg by mouth at bedtime.   multivitamin with minerals Tabs tablet Take 1 tablet by mouth daily.   Myrbetriq  25 MG Tb24 tablet Generic drug: mirabegron  ER Take 1 tablet (25 mg total) by mouth daily. Notes to patient: For hyperactive bladder   NovoLOG  ReliOn 100 UNIT/ML injection Generic drug: insulin  aspart Inject 2-20 Units into the skin 3 (three) times daily with meals. Notes to patient: Resume home regimen   pantoprazole  40 MG tablet Commonly known as: PROTONIX  Take 1 tablet (40 mg total) by mouth daily.   polyethylene glycol powder 17 GM/SCOOP powder Commonly known as: GLYCOLAX /MIRALAX  Take 17 g by mouth daily.   pramipexole  1 MG tablet Commonly known as: MIRAPEX  Take 1 mg by mouth at bedtime.   pregabalin  75 MG capsule Commonly known as:  LYRICA  Take 1 capsule (75 mg total) by mouth 3 (three) times daily. What changed: when to take this   Senna-S 8.6-50 MG tablet Generic drug: senna-docusate Take 2 tablets by mouth 2 (two) times daily. Notes to patient: Laxative--can adjust as needed   tirzepatide 5 MG/0.5ML Pen Commonly known as: MOUNJARO Inject 5 mg into the skin once a week. Notes to patient: Resume at home   torsemide  20 MG tablet Commonly known as: DEMADEX  Take 40 mg by mouth daily.   Tresiba  FlexTouch 200 UNIT/ML FlexTouch Pen Generic drug: insulin  degludec Inject 40 Units into the skin daily with breakfast. What changed:  how much to take when to take this Notes to patient: Take 40 units daily. Need to eat consistently so that your blood  sugars do not drop.                Discharge Care Instructions  (From admission, onward)           Start     Ordered   01/31/24 0000  Change dressing on IV access line weekly and PRN  (Home infusion instructions - [facility] )        01/31/24 1716            Follow-up Information     Dough, Ardeth Beckers, MD Follow up.   Specialty: Family Medicine Why: Call in 1-2 days for post hospital follow up Contact information: [address] [place] [address] [phone]         Cherri Corns C, DO Follow up.   Specialty: Physical Medicine and Rehabilitation Why: office will call you with follow up appointment Contact information: [address] [place] [address] [phone]         Joaquin Mulberry, MD. Call.   Specialty: Neurosurgery Why: today or Monday for post hospital follow up Contact information: [address] [place] [address] [phone]         Terre Ferri, MD. Call.   Specialty: Infectious Diseases Why: Monday for follow up appointment Contact information: [address] [place] [address] [phone]                  Signed: Zelda Hickman 02/03/2024, 10:30 AM

## 2024-02-04 ENCOUNTER — Telehealth: Payer: Self-pay

## 2024-02-04 NOTE — Telephone Encounter (Signed)
 Transitional Care call--who you talked with    Are you/is patient experiencing any problems since coming home?  No Are there any questions regarding any aspect of care? No Are there any questions regarding medications administration/dosing? Are meds being taken as prescribed? No Patient should review meds with caller to confirm- Patient confirmed all medications listed on discharge summary are correct. Have there been any falls?  No Has Home Health been to the house and/or have they contacted you? If not, have you tried to contact them?  [facility] scheduled today at 3:30 pm       Can we help you contact them?  Patient already scheduled Are bowels and bladder emptying properly? No.  Are there any unexpected incontinence issues? Patient reports " after I take my fluid pill, it can be hard to get to the bathroom in time".  If applicable, is patient following bowel/bladder programs? N/A Any fevers, problems with breathing, unexpected pain? No fevers, or problems with breathing.  Patient reports having back pain when trying to lay down but, pain improves once patient get positioned in the bed. Are there any skin problems or new areas of breakdown?  Patient reports bruising lower abdomen from Lovenox  injections. Has the patient/family member arranged specialty MD follow up (ie cardiology/neurology/renal/surgical/etc)?  Can we help arrange?  Patient reports all follow up appointments have been scheduled. Does the patient need any other services or support that we can help arrange? Patient states nothing at this time. Are caregivers following through as expected in assisting the patient? Patient reports that all care giving needs have been met at this time.  Has the patient quit smoking, drinking alcohol, or using drugs as recommended? Patient declines being a smoker.  Appointment time, arrive time and who it is with here [address] [phone]

## 2024-02-06 ENCOUNTER — Encounter: Attending: Physical Medicine and Rehabilitation | Admitting: Physical Medicine and Rehabilitation

## 2024-02-06 ENCOUNTER — Encounter: Payer: Self-pay | Admitting: Physical Medicine and Rehabilitation

## 2024-02-06 VITALS — BP 142/61 | HR 87

## 2024-02-06 DIAGNOSIS — G062 Extradural and subdural abscess, unspecified: Secondary | ICD-10-CM | POA: Insufficient documentation

## 2024-02-06 DIAGNOSIS — M4714 Other spondylosis with myelopathy, thoracic region: Secondary | ICD-10-CM | POA: Insufficient documentation

## 2024-02-06 DIAGNOSIS — N319 Neuromuscular dysfunction of bladder, unspecified: Secondary | ICD-10-CM | POA: Insufficient documentation

## 2024-02-06 DIAGNOSIS — M792 Neuralgia and neuritis, unspecified: Secondary | ICD-10-CM | POA: Insufficient documentation

## 2024-02-06 MED ORDER — MIRABEGRON ER 25 MG PO TB24: 50.0000 mg | ORAL_TABLET | Freq: Every day | ORAL | 0 refills | Status: DC

## 2024-02-06 MED ORDER — DULOXETINE HCL 30 MG PO CPEP
ORAL_CAPSULE | ORAL | 0 refills | Status: DC
Start: 2024-02-06 — End: 2024-05-07

## 2024-02-06 MED ORDER — DULOXETINE HCL 60 MG PO CPEP
ORAL_CAPSULE | ORAL | 0 refills | Status: DC
Start: 2024-02-06 — End: 2024-05-05

## 2024-02-06 NOTE — Patient Instructions (Signed)
 Continue Lyrica  and oxycodone  at current doses through your pain management office.  Stop hydrocodone /Norco.  Follow-up with neurosurgery and infectious disease.  In addition, because of worsening urinary urgency and suspicion of neurogenic bladder, call your urology office and make an appointment in the next few weeks.  I am increasing your Myrbetriq  to 2 tablets or 50 mg once daily.  If any side effects to this medication such as inability to void for more than 8-12 hours, stop the medication and go to the ER.  You do not have any symptoms concerning for urinary tract infection at this time.  I am increasing your duloxetine , continue 60 mg at night and start 30 mg in the morning.  After 4 weeks, if you are noting benefit from this medication in terms of your pain control, mood, and/or sleep, stop the 30 mg tablets and take the 60 mg tablets twice daily.  Continue home therapies, and be Patient with progress.  This is a months long process, and you are doing very well.  Follow-up with me in 3 months.  If you need anything between now and then, feel free to call the clinic or message me through MyChart.
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
## 2024-02-10 DIAGNOSIS — N319 Neuromuscular dysfunction of bladder, unspecified: Secondary | ICD-10-CM | POA: Insufficient documentation

## 2024-02-10 NOTE — Progress Notes (Signed)
 Cardiology Office Note:  .   Date:  02/11/2024  ID:  Katelyn [name], DOB 1955/12/27, MRN [id number] PCP: Madlyn Schirmer, MD  [facility] Providers Cardiologist:  Zoe Hinds, MD    History of Present Illness: .   Katelyn [name] is a 68 y.o. female with a past medical history of nonobstructive CAD LHC 2021, hypertension, LBBB, chronic diastolic heart failure, PAD, pulmonary hypertension, NSVT, OSA, restrictive lung disease, GERD, DM 2, CKD stage III, thrombocytopenia, hyperlipidemia, cirrhosis.  01/17/2023 echo EF 40 to 45%, mild concentric LVH, elevated LVEDP, mild TR, PASP moderately to severely elevated 10/06/2020 echo EF 55 to 60%, mild LVH 10/05/2020 cardiac cath nonobstructive CAD  She was admitted to [facility] on 03/13/2023 with altered mental status and found to be in acute renal failure creatinine elevated to 4.06, BUN 95.  CT of the head was negative for any abnormalities, EEG was negative, ID was consulted who recommended to hold cefepime  and daptomycin  (previously being treated for osteomyelitis related to recent pain stimulator implantation).  She was hyponatremic although this had been somewhat of an ongoing issue for her for some time.  EF revealed mild LVH, EF 40 to 45%, elevated LA and LV end-diastolic pressures.  She was admitted [facility] 01/11/2024 to 02/01/2024 after presenting to the hospital with progressive weakness, difficulty walking, urinary retention, she was taken to the OR and underwent decompressive thoracic laminectomy with removal of hardware by Dr. Rochelle Chu.  She was transferred to the rehab unit on 01/11/2024 where she completed 21 days of rehab, and was subsequently discharged home with home health services.  She presents today for follow-up after recent hospitalization as outlined above.  She is in a wheelchair and accompanied by her husband.  She has home health services provided through [facility] to her home including PT/OT, nursing services.  She  currently has a PICC line.  The nurse was at her house yesterday to take blood work, no results were available for review.  She is feeling better, she actually looks better than the last time I saw her.  She has lost some weight.  She is dedicated to try to make better lifestyle decisions. She denies chest pain, palpitations, dyspnea, pnd, orthopnea, n, v, dizziness, syncope, edema, weight gain, or early satiety.    ROS: Review of Systems  Constitutional: Negative.   HENT: Negative.    Eyes: Negative.   Respiratory: Negative.    Cardiovascular:  Positive for leg swelling. Negative for chest pain, palpitations, orthopnea and PND.  Gastrointestinal: Negative.   Genitourinary: Negative.   Musculoskeletal:  Positive for back pain and joint pain.  Skin: Negative.   Neurological: Negative.   Endo/Heme/Allergies: Negative.   Psychiatric/Behavioral: Negative.       Studies Reviewed: .        Cardiac Studies & Procedures   ______________________________________________________________________________________________ CARDIAC CATHETERIZATION  CARDIAC CATHETERIZATION 10/05/2020  Conclusion  Prox LAD lesion is 30% stenosed.  1st Diag lesion is 55% stenosed.  Dist Cx lesion is 40% stenosed.  Prox RCA lesion is 25% stenosed.  Dist RCA lesion is 25% stenosed.  There is mild left ventricular systolic dysfunction.  LV end diastolic pressure is moderately elevated.  The left ventricular ejection fraction is 50-55% by visual estimate.  Hemodynamic findings consistent with mild pulmonary hypertension.  1. Nonobstructive CAD. Codominant circulation 2. Low normal LV systolic function. EF estimated at 50%. 3. Elevated LV filling pressures - PCWP 22 mm Hg, EDP 25 mm Hg 4.  Mild pulmonary HTN with mean PAP 27 mm Hg 5. Elevated RA pressures 16 mm Hg 6. Reduced cardiac output with index 2.5  Plan: medical management for CAD. Need to repeat Echo to assess LV and RV function. May need to  consider pulmonary embolic disease given pulmonary HTN and elevated RA pressures. This may reflect elevated Left heart pressures. Morbid obesity and OSA may also be contributing.  Findings Coronary Findings Diagnostic  Dominance: Co-dominant  Left Main Vessel was injected. Vessel is normal in caliber. Vessel is angiographically normal.  Left Anterior Descending Prox LAD lesion is 30% stenosed.  First Diagonal Branch 1st Diag lesion is 55% stenosed.  Left Circumflex Dist Cx lesion is 40% stenosed.  Right Coronary Artery Prox RCA lesion is 25% stenosed. Dist RCA lesion is 25% stenosed.  Intervention  No interventions have been documented.   STRESS TESTS  MYOCARDIAL PERFUSION IMAGING 04/09/2019  Narrative  The left ventricular ejection fraction is normal (55-65%).  Nuclear stress EF: 56%.  There was no ST segment deviation noted during stress.  The study is normal.  This is a low risk study.   ECHOCARDIOGRAM  ECHOCARDIOGRAM COMPLETE 10/06/2020  Narrative ECHOCARDIOGRAM REPORT    Patient Name:   MAURIELLE PAROLA Welty Date of Exam: 10/06/2020 Medical Rec #:  [id number]   Height:       62.0 in Accession #:    [id number]  Weight:       222.4 lb Date of Birth:  [birth year]   BSA:          2.000 m Patient Age:    64 years    BP:           145/73 mmHg Patient Gender: F           HR:           77 bpm. Exam Location:  Inpatient  Procedure: 2D Echo, 3D Echo, Color Doppler, Cardiac Doppler and Strain Analysis  Indications:    Pulmonary hypertension 416.8 / I27.2  History:        Patient has prior history of Echocardiogram examinations, most recent 04/28/2019. CAD, Arrythmias:LBBB; Risk Factors:Sleep Apnea, Hypertension, Diabetes and Dyslipidemia. GERD.  Sonographer:    Konnie Perry RDCS Referring Phys: [phone] PETER M [place]  IMPRESSIONS   1. Left ventricular ejection fraction, by estimation, is 55 to 60%. The left ventricle has normal function. The left ventricle has no  regional wall motion abnormalities. There is mild left ventricular hypertrophy. Left ventricular diastolic parameters are indeterminate. 2. Right ventricular systolic function is normal. The right ventricular size is normal. Tricuspid regurgitation signal is inadequate for assessing PA pressure. 3. The mitral valve is normal in structure. No evidence of mitral valve regurgitation. No evidence of mitral stenosis. 4. The aortic valve is tricuspid. Aortic valve regurgitation is not visualized. No aortic stenosis is present.  FINDINGS Left Ventricle: Left ventricular ejection fraction, by estimation, is 55 to 60%. The left ventricle has normal function. The left ventricle has no regional wall motion abnormalities. The left ventricular internal cavity size was normal in size. There is mild left ventricular hypertrophy. Left ventricular diastolic parameters are indeterminate.  Right Ventricle: The right ventricular size is normal. No increase in right ventricular wall thickness. Right ventricular systolic function is normal. Tricuspid regurgitation signal is inadequate for assessing PA pressure. The tricuspid regurgitant velocity is 1.34 m/s, and with an assumed right atrial pressure of 3 mmHg, the estimated right ventricular systolic pressure is 10.2 mmHg.  Left Atrium: Left  atrial size was normal in size.  Right Atrium: Right atrial size was normal in size.  Pericardium: There is no evidence of pericardial effusion.  Mitral Valve: The mitral valve is normal in structure. No evidence of mitral valve regurgitation. No evidence of mitral valve stenosis.  Tricuspid Valve: The tricuspid valve is normal in structure. Tricuspid valve regurgitation is trivial.  Aortic Valve: The aortic valve is tricuspid. Aortic valve regurgitation is not visualized. No aortic stenosis is present.  Pulmonic Valve: The pulmonic valve was not well visualized. Pulmonic valve regurgitation is not visualized.  Aorta: The  aortic root and ascending aorta are structurally normal, with no evidence of dilitation.  IAS/Shunts: The interatrial septum was not well visualized.   LEFT VENTRICLE PLAX 2D LVIDd:         4.80 cm      Diastology LVIDs:         3.20 cm      LV e' medial:    6.65 cm/s LV PW:         0.90 cm      LV E/e' medial:  14.7 LV IVS:        1.00 cm      LV e' lateral:   7.38 cm/s LVOT diam:     2.00 cm      LV E/e' lateral: 13.2 LV SV:         72 LV SV Index:   36 LVOT Area:     3.14 cm  3D Volume EF: LV Volumes (MOD)            3D EF:        58 % LV vol d, MOD A2C: 93.1 ml  LV EDV:       174 ml LV vol d, MOD A4C: 120.0 ml LV ESV:       73 ml LV vol s, MOD A2C: 33.6 ml  LV SV:        101 ml LV vol s, MOD A4C: 49.0 ml LV SV MOD A2C:     59.5 ml LV SV MOD A4C:     120.0 ml LV SV MOD BP:      64.5 ml  RIGHT VENTRICLE RV S prime:     13.50 cm/s TAPSE (M-mode): 3.0 cm  LEFT ATRIUM             Index       RIGHT ATRIUM          Index LA diam:        4.10 cm 2.05 cm/m  RA Area:     9.25 cm LA Vol (A2C):   47.0 ml 23.50 ml/m RA Volume:   14.40 ml 7.20 ml/m LA Vol (A4C):   50.8 ml 25.40 ml/m LA Biplane Vol: 51.1 ml 25.55 ml/m AORTIC VALVE LVOT Vmax:   112.00 cm/s LVOT Vmean:  79.200 cm/s LVOT VTI:    0.229 m  AORTA Ao Root diam: 2.70 cm Ao Asc diam:  2.90 cm  MITRAL VALVE               TRICUSPID VALVE MV Area (PHT): 4.68 cm    TR Peak grad:   7.2 mmHg MV Decel Time: 162 msec    TR Vmax:        134.00 cm/s MV E velocity: 97.70 cm/s MV A velocity: 75.80 cm/s  SHUNTS MV E/A ratio:  1.29        Systemic VTI:  0.23 m Systemic Diam:  2.00 cm  Carson Clara MD Electronically signed by Carson Clara MD Signature Date/Time: 10/06/2020/5:17:16 PM    Final          ______________________________________________________________________________________________      Risk Assessment/Calculations:             Physical Exam:   VS:  BP 138/70   Pulse 88   Ht  5\' 1"  (1.549 m)   Wt 197 lb (89.4 kg)   SpO2 97%   BMI 37.22 kg/m    Wt Readings from Last 3 Encounters:  02/11/24 197 lb (89.4 kg)  02/01/24 199 lb 4.7 oz (90.4 kg)  01/09/24 200 lb (90.7 kg)    GEN: Well nourished, well developed in no acute distress NECK: No JVD; No carotid bruits CARDIAC: RRR, no murmurs, rubs, gallops RESPIRATORY:  Clear to auscultation without rales, wheezing or rhonchi  ABDOMEN: Soft, non-tender, non-distended EXTREMITIES:  +1 pitting edema; No deformity   ASSESSMENT AND PLAN: .   CAD-nonobstructive per LHC in 2021, Stable with no anginal symptoms. No indication for ischemic evaluation.  Continue aspirin  81 mg daily.  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 Continue Coreg  6.25 mg twice daily.  Currently on Farxiga 10 mg daily.  Other GDMT prohibited by kidney dysfunction. She follows with nephrology.   CKD -lab work was checked yesterday by home health nurse, she also follows with nephrology.  HLD -most recent LDL elevated at 104, she is statin intolerant, has undefined type of hepatitis. Previously referred to PharmD for PCSK9i consideration.     Dispo: Follow-up in 3 months.  Signed, Terrance Ferretti, NP

## 2024-02-11 ENCOUNTER — Encounter: Payer: Self-pay | Admitting: Cardiology

## 2024-02-11 ENCOUNTER — Ambulatory Visit: Attending: Cardiology | Admitting: Cardiology

## 2024-02-11 VITALS — BP 138/70 | HR 88 | Ht 61.0 in | Wt 197.0 lb

## 2024-02-11 DIAGNOSIS — I5020 Unspecified systolic (congestive) heart failure: Secondary | ICD-10-CM

## 2024-02-11 DIAGNOSIS — I2510 Atherosclerotic heart disease of native coronary artery without angina pectoris: Secondary | ICD-10-CM | POA: Diagnosis not present

## 2024-02-11 DIAGNOSIS — E1169 Type 2 diabetes mellitus with other specified complication: Secondary | ICD-10-CM

## 2024-02-11 DIAGNOSIS — I5022 Chronic systolic (congestive) heart failure: Secondary | ICD-10-CM

## 2024-02-11 DIAGNOSIS — E785 Hyperlipidemia, unspecified: Secondary | ICD-10-CM

## 2024-02-11 DIAGNOSIS — I447 Left bundle-branch block, unspecified: Secondary | ICD-10-CM

## 2024-02-11 NOTE — Patient Instructions (Signed)
 Medication Instructions:  Your physician recommends that you continue on your current medications as directed. Please refer to the Current Medication list given to you today.  *If you need a refill on your cardiac medications before your next appointment, please call your pharmacy*  Lab Work: None If you have labs (blood work) drawn today and your tests are completely normal, you will receive your results only by: MyChart Message (if you have MyChart) OR A paper copy in the mail If you have any lab test that is abnormal or we need to change your treatment, we will call you to review the results.  Testing/Procedures: None  Follow-Up: At [facility], you and your health needs are our priority.  As part of our continuing mission to provide you with exceptional heart care, our providers are all part of one team.  This team includes your primary Cardiologist (physician) and Advanced Practice Providers or APPs (Physician Assistants and Nurse Practitioners) who all work together to provide you with the care you need, when you need it.  Your next appointment:   3 month(s)  Provider:   Norman Herrlich, MD    We recommend signing up for the patient portal called "MyChart".  Sign up information is provided on this After Visit Summary.  MyChart is used to connect with patients for Virtual Visits (Telemedicine).  Patients are able to view lab/test results, encounter notes, upcoming appointments, etc.  Non-urgent messages can be sent to your provider as well.   To learn more about what you can do with MyChart, go to [email or web address].   Other Instructions None

## 2024-02-19 ENCOUNTER — Other Ambulatory Visit: Payer: Self-pay

## 2024-02-19 ENCOUNTER — Ambulatory Visit: Admitting: Infectious Diseases

## 2024-02-19 VITALS — BP 149/83 | HR 92 | Temp 98.3°F

## 2024-02-19 DIAGNOSIS — Z452 Encounter for adjustment and management of vascular access device: Secondary | ICD-10-CM

## 2024-02-19 DIAGNOSIS — Z969 Presence of functional implant, unspecified: Secondary | ICD-10-CM | POA: Diagnosis not present

## 2024-02-19 DIAGNOSIS — Z79899 Other long term (current) drug therapy: Secondary | ICD-10-CM

## 2024-02-19 DIAGNOSIS — G061 Intraspinal abscess and granuloma: Secondary | ICD-10-CM | POA: Diagnosis not present

## 2024-02-19 MED ORDER — DOXYCYCLINE HYCLATE 100 MG PO TABS: 100.0000 mg | ORAL_TABLET | Freq: Two times a day (BID) | ORAL | 5 refills | Status: DC

## 2024-02-19 NOTE — Progress Notes (Signed)
 Patient Active Problem List   Diagnosis Date Noted   Neurogenic bladder 02/10/2024   Neuropathic pain 02/06/2024   Chronic pain syndrome 01/25/2024   Thoracic myelopathy 01/11/2024   Thoracic spondylosis with myelopathy 01/07/2024   Hepatic cirrhosis (HCC) 11/22/2023   ACS (acute coronary syndrome) (HCC) 04/06/2023   Acute sciatica 04/06/2023   Dyslipidemia 04/06/2023   Hypertensive heart and chronic kidney disease 04/06/2023   Hyponatremia 04/06/2023   Hypothermia 04/06/2023   Renal insufficiency 04/06/2023   SIRS (systemic inflammatory response syndrome) (HCC) 04/06/2023   Slurred speech 04/06/2023   Thrombocytopenia (HCC) 04/06/2023   Ventricular tachycardia, nonsustained (HCC) 04/06/2023   PICC (peripherally inserted central catheter) in place 03/28/2023   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   Acute renal failure (ARF) (HCC) 03/13/2023   Elevated LFTs 03/13/2023   AKI (acute kidney injury) (HCC) 02/20/2023   Status post thoracic spinal fusion 02/19/2023   Discitis 02/13/2023   Malfunction of spinal cord stimulator (HCC) 02/13/2023   Serratia 02/13/2023   Diabetes mellitus without complication (HCC) 02/13/2023   Osteomyelitis (HCC) 02/05/2023   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   Infection of spinal cord stimulator (HCC) 12/07/2022   Lactic acidosis 12/06/2022   Hypomagnesemia 12/06/2022   Anemia of chronic disease 12/06/2022   Diskitis 12/05/2022   Status post insertion of spinal cord stimulator 11/22/2022   Acute thoracic back pain 06/22/2022   Other long term (current) drug therapy 06/22/2022   Lumbar spondylosis 06/22/2022   Thoracic spondylosis 06/22/2022   Bilateral sacroiliitis (HCC) 06/22/2022   Chronic, continuous use of opioids 05/31/2022   Pain medication agreement 05/02/2022   Postlaminectomy syndrome of lumbar region 05/02/2022   Radiculopathy of thoracolumbar region 05/02/2022   Class 2 severe obesity due to excess calories with serious comorbidity and body  mass index (BMI) of 37.0 to 37.9 in adult [facility]) 05/02/2022   Opioid use agreement exists 05/02/2022   Mastodynia 04/13/2022   Peripheral artery disease (HCC) 07/11/2021   COVID-19 virus infection 06/21/2021   Rib pain on left side 06/06/2021   Abdominal pain 06/05/2021   Diabetes 1.5, 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   GAD (generalized anxiety disorder) 11/25/2020   Anxiety and depression 11/03/2020   DM2 (diabetes mellitus, type 2) (HCC)    Restless leg syndrome    PONV (postoperative nausea and vomiting)    Neuropathy    Lumbar stenosis    Left bundle branch block    Hypercholesterolemia    Heart murmur    History of kidney stones    H/O hiatal hernia    Essential hypertension    Dyspnea    Chronic depression    Complication of anesthesia    CKD stage 3b, GFR 30-44 ml/min (HCC)    CAD (coronary artery disease)    Bursitis of right hip    Arthritis    Anxiety    Morbid obesity (HCC) 10/07/2020   Hypoxia 10/07/2020   History of esophageal stricture 10/07/2020   CKD (chronic kidney disease), stage III (HCC) 10/07/2020   Leg weakness 10/07/2020   UTI (urinary tract infection) 10/07/2020   Chronic hypoxemic respiratory failure (HCC) 10/07/2020   Pulmonary hypertension (HCC) 10/06/2020   Chest pain, neg MI, stable CAD non obstructive on cath 10/05/20 10/04/2020   Hardening of the aorta (main artery of the heart) (HCC) 11/25/2019   Hypertensive heart disease with congestive heart failure and chronic kidney disease (HCC) 11/25/2019   Shortness of breath 06/11/2019  Fatty liver 04/08/2019   Migraine 04/08/2019   History of chronic kidney disease 04/08/2019   Acute non-recurrent sinusitis 10/12/2017   Chronic diastolic CHF (congestive heart failure) (HCC) 08/20/2017   Genetic testing 08/17/2017   Family history of ovarian cancer 08/17/2017   Family history of breast cancer 08/17/2017   Cough 04/19/2017   DOE (dyspnea on  exertion) 04/19/2017   Type 2 diabetes mellitus with diabetic neuropathy, unspecified (HCC) 01/16/2017   Hypertensive heart disease with heart failure (HCC) 01/01/2017   Hyperlipidemia 01/01/2017   LBBB (left bundle branch block) 01/01/2017   Elevated liver enzymes 12/05/2016   Meralgia paraesthetica 12/05/2016   Tinnitus 12/05/2016   Spinal stenosis of lumbar region with neurogenic claudication 12/05/2016   Wound dehiscence 10/23/2016   S/P lumbar spinal fusion 08/29/2016   OSA (obstructive sleep apnea) 05/24/2016   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   Chronic low back pain without sciatica 03/14/2016   GERD (gastroesophageal reflux disease) 03/14/2016   Gout 03/14/2016   Iron  deficiency anemia due to chronic blood loss 03/14/2016   Type 2 diabetes mellitus without complication, with long-term current use of insulin  (HCC) 03/14/2016   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HCC) 12/07/2015   Restless leg 12/07/2015   Insomnia 12/07/2015   S/P lumbar laminectomy 11/26/2015   Postprocedural state 11/26/2015   Mild CAD 11/24/2015   Lumbar radiculopathy 10/15/2015   History of blood transfusion 2016   Kidney stone 04/20/2014   Stricture of esophagus 04/20/2014   Greater trochanteric bursitis of right hip 02/02/2012   Iliotibial band syndrome of right side 02/02/2012   Iliotibial band syndrome 02/02/2012   Bursitis, trochanteric 02/02/2012    Patient's Medications  New Prescriptions   No medications on file  Previous Medications   CEFTRIAXONE  (ROCEPHIN ) IVPB    Inject 2 g into the vein daily. Indication:  C acnes discitis First Dose: Yes Last Day of Therapy:  02/20/24 Labs - Once weekly:  CBC/D and BMP, Labs - Once weekly: ESR and CRP Method of administration: IV Push Method of administration may be changed at the discretion of home infusion pharmacist based upon assessment of the patient and/or caregiver's ability to self-administer the medication ordered.   CIPROFLOXACIN   (CIPRO ) 500 MG TABLET    Take 1 tablet (500 mg total) by mouth 2 (two) times daily.   DICLOFENAC  SODIUM (VOLTAREN ) 1 % GEL    Apply 2 g topically 4 (four) times daily.   DULOXETINE  (CYMBALTA ) 30 MG CAPSULE    Take one 30 mg capsule of Cymbalta  in the morning and one 60 mg capsule of cymbalta  at nighttime for 4 weeks; after this, if seeing benefit without side effects, increase Cymbalta  to 60 mg twice daily   DULOXETINE  (CYMBALTA ) 60 MG CAPSULE    Take 1 capsule (60 mg total) by mouth at bedtime for 30 days, THEN 1 capsule (60 mg total) 2 (two) times daily. Take one 30 mg capsule of Cymbalta  in the morning and one 60 mg capsule of cymbalta  at nighttime for 4 weeks; after this, if seeing benefit without side effects, increase Cymbalta  to 60 mg twice daily.   MELATONIN 5 MG TABS    Take 1 tablet (5 mg total) by mouth at bedtime as needed.   MIRABEGRON  ER (MYRBETRIQ ) 25 MG TB24 TABLET    Take 2 tablets (50 mg total) by mouth daily.   MIRTAZAPINE  (REMERON ) 30 MG TABLET    Take 30 mg by mouth at bedtime.   MULTIPLE VITAMIN (MULTIVITAMIN WITH MINERALS)  TABS TABLET    Take 1 tablet by mouth daily.   MULTIPLE VITAMINS-MINERALS (HAIR SKIN AND NAILS FORMULA) TABS    Take 1 tablet by mouth daily.   NOVOLOG  RELION 100 UNIT/ML INJECTION    Inject 2-20 Units into the skin 3 (three) times daily with meals.   PANTOPRAZOLE  (PROTONIX ) 40 MG TABLET    Take 1 tablet (40 mg total) by mouth daily.   POLYETHYLENE GLYCOL POWDER (GLYCOLAX /MIRALAX ) 17 GM/SCOOP POWDER    Take 17 g by mouth daily.   PRAMIPEXOLE  (MIRAPEX ) 1 MG TABLET    Take 1 mg by mouth at bedtime.   PREGABALIN  (LYRICA ) 75 MG CAPSULE    Take 1 capsule (75 mg total) by mouth 3 (three) times daily.   SENNA-DOCUSATE (SENOKOT-S) 8.6-50 MG TABLET    Take 2 tablets by mouth 2 (two) times daily.   TIRZEPATIDE (MOUNJARO) 5 MG/0.5ML PEN    Inject 5 mg into the skin once a week.   TORSEMIDE  (DEMADEX ) 20 MG TABLET    Take 40 mg by mouth daily.   TRESIBA  FLEXTOUCH 200  UNIT/ML FLEXTOUCH PEN    Inject 40 Units into the skin daily with breakfast.  Modified Medications   No medications on file  Discontinued Medications   No medications on file    Subjective Discussed the use of AI scribe software for clinical note transcription with the patient, who gave verbal consent to proceed.   68 year old female with prior postop infection with Serratia marcescens in 2017, followed by T11-T12 discitis/osteomyelitis with epidural phlegmon with spinal cord stimulator in place, with negative cultures 11/2022 s/p course of vancomycin  and cefepime  complicated with AKI due to vancomycin , switched later to Ciprofloxacin  and doxycycline  but worsening pain after stimulator program changed with MRI showing progressive discitis/osteomyelitis s/p thoracic laminectomy with medial facetectomy T11-T12 and posterior fixation T12-L2, instrumented posterior fixation and removal of [place] stimulator ( cx NG) discharged on empiric daptomycin  and cefepime  but later cefepime  changed to ertapenem  in the setting of AMS and acute renal failure> PO doxycycline  and ciprofloxacin  suppression who is here for HFU after recent admission 4/2-4/25 for electively surgery in the setting of acute onset paraparesis. No prior fevers, chills, malaise, nausea, vomiting. Compliant with OP doxy/cipro .    She is s/p decompressive thoracic laminectomy, medial facetectomy, exploration of fusion t10-l2 with removal of segmental instrumentation, removal of loosened t10 pedicle screws and replacement of b/l l1 pedicle screws, removal and replacement of of left t12 pedicle screw. Per OR note, "no evidence of significant infection. There was no pus. This looks more like a chronic pseudoarthrosis or chronic scarring or inflammation than anything else. ". Or cx no organisms on gram stain and no growth to date   3/31 CT T spine  Erosive endplate changes at T9-10 and T11-12 are unchanged since 12/19/2023 and remain concerning for  discitis-osteomyelitis. Poorly defined epidural contrast enhancement at the T9-10 level, concerning for epidural abscess. MRI with and without contrast would be more definitive. Lucency about the left T10 transpedicular screws unchanged. Initially on daptomycin  and ertapenem , however antibiotics were switched to IV ceftriaxone  and ciprofloxacin  once Or Cx isolated as c acnes + elevated LFT. Hospital course significant for CT with Rotator cuff tear, managed with analgesics. US  liver with? Cirrhosis.   Patient's discharged on 4/25 to complete ceftriaxone  and ciprofloxacin  through 5/14   5/13 Here for fu with her husband. Getting IV ceftriaxone  through PICC without any concerns. Taking ciprofloxacin  as well. No issues with antibiotics. She is not  able to walk independently and mostly bound to wheelchair.   Review of Systems: Denies fevers, chills. Denies nausea, vomiting or diarrhea. All systems reviewed with pertinent positives and negatives as listed above.   Past Medical History:  Diagnosis Date   Anxiety    Arthritis    Bursitis of right hip    CAD (coronary artery disease)    Cardiac catheterization June 2014 in [place] - 50% circumflex stenosis   Chest pain, neg MI, stable CAD non obstructive on cath 10/05/20 10/04/2020   Chronic diastolic heart failure (HCC) 08/20/2017   Chronic kidney disease, stage 3 (HCC)    does not see nephrologist   Chronic low back pain without sciatica 03/14/2016   Cirrhosis of liver (HCC)    CKD (chronic kidney disease), stage III (HCC) 10/07/2020   Complication of anesthesia    Cough 04/19/2017   Overview:  Last Assessment & Plan:  Formatting of this note may be different from the original. Cough - ? ACE related with AR triggers   Plan  Patient Instructions  Discuss with your primary doctor that lisinopril  pain, need making your cough worse. May use Mucinex  DM twice daily as needed for cough and congestion Zyrtec 10 mg at bedtime as needed for drainage  Saline nasal spray as needed. Lab tests today Activity as tolerated. Follow with Dr. Matilde Son in 3-4 months and As needed   Please contact office for sooner follow up if symptoms do not improve or worsen or seek emergency care    Depression    Dyspnea    with exertion   " lazy lung" - per  Dr Matilde Son from back issues- 06/2016   Elevated liver enzymes 12/05/2016   Essential hypertension    GERD (gastroesophageal reflux disease)    Gout 03/14/2016   Greater trochanteric bursitis of right hip 02/02/2012   H/O hiatal hernia    Heart murmur    History of blood transfusion 2016   History of esophageal stricture 10/07/2020   History of kidney stones    Hypercholesterolemia    Hypertensive heart disease with heart failure (HCC) 01/01/2017   Hypoxia 10/07/2020   Iliotibial band syndrome of right side 02/02/2012   Iron  deficiency anemia due to chronic blood loss 03/14/2016   LBBB (left bundle branch block) 01/01/2017   Left bundle branch block    Leg weakness 10/07/2020   Lumbar stenosis    Meralgia paraesthetica 12/05/2016   Mild CAD 11/24/2015   Morbid obesity (HCC) 10/07/2020   Neuropathy    OSA (obstructive sleep apnea) 05/24/2016   Overview:  Managed PULM- no CPAP   PONV (postoperative nausea and vomiting)    "no N/V with patch"   Restless leg syndrome    S/P lumbar laminectomy 11/26/2015   S/P lumbar spinal fusion 08/29/2016   Tinnitus 12/05/2016   Type 2 diabetes mellitus (HCC)    UTI (urinary tract infection) 10/07/2020   Past Surgical History:  Procedure Laterality Date   ABDOMINAL HYSTERECTOMY  1983   APPENDECTOMY  Age 37   BACK SURGERY     FIRST LUMBAR FUSION/ SURGERY APRIL 2012 AND FUSION WITH INSTRUMENTATION SEPT 2012   CARDIAC CATHETERIZATION     x 2   CARPAL TUNNEL RELEASE     bil   CHOLECYSTECTOMY  1990's   COLONOSCOPY  12/12/2017   Colonic polyp status post polypectomy. Mild sigmoid diverticulosis. Otherwise normal colonoscopy to terminal ileum.   CYSTO EXTRACTION  KIDNEY STONES     ESOPHAGOGASTRODUODENOSCOPY  12/12/2017  Small hiatal hernia. Mild gastritis. Status post esophageal dilatation.   EXCISION/RELEASE BURSA HIP  02/02/2012   Procedure: EXCISION/RELEASE BURSA HIP;  Surgeon: Florencia Hunter, MD;  Location: [facility];  Service: Orthopedics;  Laterality: Right;  Right Hip Bursectomy   EYE SURGERY Bilateral    cataracts   IR THORACIC DISC ASPIRATION W/IMG GUIDE  02/14/2023   KIDNEY STONE SURGERY  2008   LAMINECTOMY WITH POSTERIOR LATERAL ARTHRODESIS LEVEL 3 N/A 01/09/2024   Procedure: Posterior lateral fusion - Thoracic Seven-Thoracic Eight - Thoracic Eight-Thoracic Nine - Thoracic Nine-Thoracic Ten, Thoracic Ten-Eleven, Thoracic Eleven-Twelve, Thoracic Twelve -Lumbar One, extension of fusion and removal of Thoracic Ten screws;  Surgeon: Joaquin Mulberry, MD;  Location: [facility] OR;  Service: Neurosurgery;  Laterality: N/A;  Posterior lateral fusion - Thoracic Seven-Thora   LAMINECTOMY WITH POSTERIOR LATERAL ARTHRODESIS LEVEL 4 N/A 02/19/2023   Procedure: THORACIC TEN-LUMBAR TWO INSTRUMENTED FUSION;  Surgeon: Isadora Mar, MD;  Location: [facility] OR;  Service: Neurosurgery;  Laterality: N/A;   Left knee surgery x 2  1996   reconstruction   LUMBAR DISC SURGERY  08/2016   LUMBAR LAMINECTOMY/DECOMPRESSION MICRODISCECTOMY Right 11/26/2015   Procedure: Extraforaminal Microdiscectomy  - Lumbar two-three- right;  Surgeon: Isadora Mar, MD;  Location: [facility];  Service: Neurosurgery;  Laterality: Right;  right    LUMBAR WOUND DEBRIDEMENT N/A 10/25/2016   Procedure: Lumbar wound revision;  Surgeon: Isadora Mar, MD;  Location: [facility] OR;  Service: Neurosurgery;  Laterality: N/A;  Lumbar wound revision   Right shoulder surgery  2010   spur   RIGHT/LEFT HEART CATH AND CORONARY ANGIOGRAPHY N/A 10/05/2020   Procedure: RIGHT/LEFT HEART CATH AND CORONARY ANGIOGRAPHY;  Surgeon: Swaziland, Peter M, MD;  Location: [facility] INVASIVE CV LAB;  Service: Cardiovascular;  Laterality: N/A;    SPINAL CORD STIMULATOR REMOVAL  02/19/2023   Procedure: LUMBAR SPINAL CORD STIMULATOR REMOVAL;  Surgeon: Isadora Mar, MD;  Location: [facility] OR;  Service: Neurosurgery;;    Social History   Tobacco Use   Smoking status: Never   Smokeless tobacco: Never   Tobacco comments:    Prior secondhand smoke  Vaping Use   Vaping status: Never Used  Substance Use Topics   Alcohol use: Not Currently   Drug use: No    Family History  Problem Relation Age of Onset   Lung cancer Father        smoked   Hypertension Father    Stroke Father    Heart failure Mother    Bone cancer Sister    Asthma Sister     Allergies  Allergen Reactions   Atorvastatin Nausea And Vomiting and Other (See Comments)    MYALGIAS   Oxycodone  Other (See Comments)    Confusion, staring episodes   Talwin [Pentazocine] Other (See Comments)    headache   Cefepime  Other (See Comments)    Presumed AMS/neurotoxicity in the setting of AKI   Gabapentin  Swelling   Other Nausea Only    UNSPECIFIED Anesthesia    Health Maintenance  Topic Date Due   OPHTHALMOLOGY EXAM  Never done   Diabetic kidney evaluation - Urine ACR  Never done   MAMMOGRAM  Never done   COVID-19 Vaccine (3 - Pfizer risk series) 12/13/2020   DEXA SCAN  Never done   FOOT EXAM  04/14/2023   Pneumonia Vaccine 66+ Years old (3 of 3 - PPSV23, PCV20 or PCV21) 08/24/2023   INFLUENZA VACCINE  05/09/2024   Medicare Annual Wellness (AWV)  05/27/2024   HEMOGLOBIN A1C  07/03/2024   Diabetic kidney evaluation - eGFR measurement  01/27/2025   Colonoscopy  12/13/2027   DTaP/Tdap/Td (4 - Td or Tdap) 03/31/2032   Hepatitis C Screening  Completed   Zoster Vaccines- Shingrix  Completed   HPV VACCINES  Aged Out   Meningococcal B Vaccine  Aged Out    Objective: BP (!) 149/83   Pulse 92   Temp 98.3 F (36.8 C) (Oral)   SpO2 97%    Physical Exam Constitutional:      Appearance: Normal appearance.  HENT:     Head: Normocephalic and atraumatic.       Mouth: Mucous membranes are moist.  Eyes:    Conjunctiva/sclera: Conjunctivae normal.     Pupils: Pupils are equal, round, and b/l symmetrical    Cardiovascular:     Rate and Rhythm: Normal rate and regular rhythm.     Heart sounds:  Pulmonary:     Effort: Pulmonary effort is normal.     Breath sounds:   Abdominal:     General: Non distended     Palpations:  Musculoskeletal:        General:sitting in the wheel chair   Skin:    General: Skin is warm and dry.     Comments: Posterior thoracolumbar surgical wound has healed   Neurological:     General:     Mental Status: awake, alert and oriented to person, place, and time.   Psychiatric:        Mood and Affect: Mood normal.   Lab Results Lab Results  Component Value Date   WBC 4.0 01/31/2024   HGB 8.6 (L) 01/31/2024   HCT 25.6 (L) 01/31/2024   MCV 88.3 01/31/2024   PLT 169 01/31/2024    Lab Results  Component Value Date   CREATININE 1.37 (H) 01/28/2024   BUN 25 (H) 01/28/2024   NA 132 (L) 01/28/2024   K 3.8 01/28/2024   CL 95 (L) 01/28/2024   CO2 28 01/28/2024    Lab Results  Component Value Date   ALT 37 01/28/2024   AST 36 01/28/2024   GGT 207 (H) 01/16/2024   ALKPHOS 263 (H) 01/28/2024   BILITOT 0.5 01/28/2024    Lab Results  Component Value Date   CHOL 178 11/16/2021   HDL 51 11/16/2021   LDLCALC 104 (H) 11/16/2021   TRIG 132 11/16/2021   CHOLHDL 3.5 11/16/2021   No results found for: "LABRPR", "RPRTITER" No results found for: "HIV1RNAQUANT", "HIV1RNAVL", "CD4TABS"   Microbiology Results for orders placed or performed during the hospital encounter of 01/07/24  MRSA Next Gen by PCR, Nasal     Status: None   Collection Time: 01/09/24 11:35 AM   Specimen: Nasal Mucosa; Nasal Swab  Result Value Ref Range Status   MRSA by PCR Next Gen NOT DETECTED NOT DETECTED Final    Comment: (NOTE) The GeneXpert MRSA Assay (FDA approved for NASAL specimens only), is one component of a comprehensive MRSA  colonization surveillance program. It is not intended to diagnose MRSA infection nor to guide or monitor treatment for MRSA infections. Test performance is not FDA approved in patients less than 10 years old. Performed at [facility], [address] [place], [place] [address]   Aerobic/Anaerobic Culture w Gram Stain (surgical/deep wound)     Status: None   Collection Time: 01/09/24  4:34 PM   Specimen: Intervertebral Disc; Tissue  Result Value Ref Range Status   Specimen  Description WOUND  Final   Special Requests INTERVERTERAL DISC 9,10 PT ON ANCEF   Final   Gram Stain NO WBC SEEN NO ORGANISMS SEEN   Final   Culture   Final    No growth aerobically or anaerobically. Performed at [facility], [address], [place], [place] [address]    Report Status 01/14/2024 FINAL  Final  Aerobic/Anaerobic Culture w Gram Stain (surgical/deep wound)     Status: None   Collection Time: 01/09/24  4:44 PM   Specimen: Intervertebral Disc; Tissue  Result Value Ref Range Status   Specimen Description WOUND  Final   Special Requests INTERVERTEBRAL DISC 9,10 SWAB PT ON ANCEF   Final   Gram Stain NO WBC SEEN NO ORGANISMS SEEN   Final   Culture   Final    RARE CUTIBACTERIUM ACNES Standardized susceptibility testing for this organism is not available. Performed at [facility], [address], [place], [place] [address]    Report Status 01/15/2024 FINAL  Final   Imaging CT T spine 01/08/24 1. Severe thecal sac attenuation at the T9-10 level by soft tissue density material, likely epidural phlegmon/abscess. 2. 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 3. Unchanged endplate erosion at T9-10 and T11-12  Assessment/Plan # Thoracic epidural abscess complicated with hardware   Plan - continue ciprofloxacin  and ceftriaxone  through 5/14> PO doxycycline  and ciprofloxacin  thereafter till next fu, duration to be  determined - Fu in 4 weeks - Fu with Nsx as planned   # PICC - no concerns  - to be removed after EOT  # Medication management  - 5/5 wbc 6.6, hb 9.1, plts 155, Cr 1.09, ESR 87, CRP 25 - EKG done at the office today with Qtc 463  I have personally spent 40 minutes involved in face-to-face and non-face-to-face activities for this patient on the day of the visit. Professional time spent includes the following activities: Preparing to see the patient (review of tests), Obtaining and reviewing separately obtained history (discharge record 4/25), Performing a medically appropriate examination and evaluation , Ordering medications and tests, Documenting clinical information in the EMR, Independently interpreting results (not separately reported), Communicating results to the patient/husband, Counseling and educating the patient/husband and Care coordination (not separately reported).   Of note, portions of this note may have been created with voice recognition software. While this note has been edited for accuracy, occasional wrong-word or 'sound-a-like' substitutions may have occurred due to the inherent limitations of voice recognition software.   Melvina Stage, MD [facility] 02/19/2024, 2:25 PM

## 2024-02-20 ENCOUNTER — Telehealth: Payer: Self-pay

## 2024-02-20 NOTE — Telephone Encounter (Signed)
 Deanna with [facility] called requesting verbal orders to remove patient's picc line. Verbal orders given to remove picc after last dose today.  Jacon Whetzel Adel Holt, CMA

## 2024-02-25 ENCOUNTER — Encounter: Payer: Self-pay | Admitting: Infectious Diseases

## 2024-02-25 NOTE — Progress Notes (Signed)
 ID brief note  5/12 cr 1.02, crp 19.8, wbc 3.1, hb 8.8, plts 161

## 2024-02-26 ENCOUNTER — Other Ambulatory Visit (HOSPITAL_COMMUNITY): Payer: Self-pay | Admitting: Endocrinology

## 2024-02-26 DIAGNOSIS — I358 Other nonrheumatic aortic valve disorders: Secondary | ICD-10-CM

## 2024-02-29 ENCOUNTER — Ambulatory Visit (INDEPENDENT_AMBULATORY_CARE_PROVIDER_SITE_OTHER): Payer: PPO | Admitting: *Deleted

## 2024-02-29 VITALS — BP 132/76 | HR 87 | Temp 98.3°F | Resp 16 | Ht 61.0 in | Wt 187.2 lb

## 2024-02-29 DIAGNOSIS — E7800 Pure hypercholesterolemia, unspecified: Secondary | ICD-10-CM

## 2024-02-29 MED ORDER — INCLISIRAN SODIUM 284 MG/1.5ML ~~LOC~~ SOSY
284.0000 mg | PREFILLED_SYRINGE | Freq: Once | SUBCUTANEOUS | Status: AC
  Administered 2024-02-29: 284 mg via SUBCUTANEOUS
  Filled 2024-02-29: qty 1.5

## 2024-02-29 NOTE — Progress Notes (Signed)
 Diagnosis: Hyperlipidemia  Provider:  Mannam, Praveen MD  Procedure: Injection  Leqvio  (inclisiran), Dose: 284 mg, Site: subcutaneous, Number of injections: 1  Injection Site(s): Left deltoid  Post Care: Patient declined observation  Discharge: Condition: Good, Destination: Home . AVS Provided  Performed by:  Devonne Folk, RN

## 2024-03-17 ENCOUNTER — Encounter: Payer: Self-pay | Admitting: Infectious Diseases

## 2024-03-17 ENCOUNTER — Other Ambulatory Visit: Payer: Self-pay

## 2024-03-17 ENCOUNTER — Ambulatory Visit: Admitting: Infectious Diseases

## 2024-03-17 VITALS — BP 126/76 | HR 88 | Temp 97.9°F | Ht 61.0 in | Wt 186.0 lb

## 2024-03-17 DIAGNOSIS — K7460 Unspecified cirrhosis of liver: Secondary | ICD-10-CM | POA: Diagnosis not present

## 2024-03-17 DIAGNOSIS — M4645 Discitis, unspecified, thoracolumbar region: Secondary | ICD-10-CM

## 2024-03-17 DIAGNOSIS — Z79899 Other long term (current) drug therapy: Secondary | ICD-10-CM

## 2024-03-17 MED ORDER — CIPROFLOXACIN HCL 500 MG PO TABS: 500.0000 mg | ORAL_TABLET | Freq: Two times a day (BID) | ORAL | 2 refills | Status: DC

## 2024-03-17 NOTE — Progress Notes (Addendum)
 Patient Active Problem List   Diagnosis Date Noted   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 02/19/2024   Medication management 02/19/2024   Presence of retained hardware 02/19/2024   Neurogenic bladder 02/10/2024   Neuropathic pain 02/06/2024   Chronic pain syndrome 01/25/2024   Thoracic myelopathy 01/11/2024   Thoracic spondylosis with myelopathy 01/07/2024   Hepatic cirrhosis (HCC) 11/22/2023   ACS (acute coronary syndrome) (HCC) 04/06/2023   Acute sciatica 04/06/2023   Dyslipidemia 04/06/2023   Hypertensive heart and chronic kidney disease 04/06/2023   Hyponatremia 04/06/2023   Hypothermia 04/06/2023   Renal insufficiency 04/06/2023   SIRS (systemic inflammatory response syndrome) (HCC) 04/06/2023   Slurred speech 04/06/2023   Thrombocytopenia (HCC) 04/06/2023   Ventricular tachycardia, nonsustained (HCC) 04/06/2023   PICC (peripherally inserted central catheter) in place 03/28/2023   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   Acute renal failure (ARF) (HCC) 03/13/2023   Elevated LFTs 03/13/2023   AKI (acute kidney injury) (HCC) 02/20/2023   Status post thoracic spinal fusion 02/19/2023   Discitis 02/13/2023   Malfunction of spinal cord stimulator (HCC) 02/13/2023   Serratia 02/13/2023   Diabetes mellitus without complication (HCC) 02/13/2023   Osteomyelitis (HCC) 02/05/2023   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   Infection of spinal cord stimulator (HCC) 12/07/2022   Lactic acidosis 12/06/2022   Hypomagnesemia 12/06/2022   Anemia of chronic disease 12/06/2022   Diskitis 12/05/2022   Status post insertion of spinal cord stimulator 11/22/2022   Acute thoracic back pain 06/22/2022   Other long term (current) drug therapy 06/22/2022   Lumbar spondylosis 06/22/2022   Thoracic spondylosis 06/22/2022   Bilateral sacroiliitis (HCC) 06/22/2022   Chronic, continuous use of opioids 05/31/2022   Pain medication agreement 05/02/2022   Postlaminectomy syndrome of lumbar region  05/02/2022   Radiculopathy of thoracolumbar region 05/02/2022   Class 2 severe obesity due to excess calories with serious comorbidity and body mass index (BMI) of 37.0 to 37.9 in adult [facility]) 05/02/2022   Opioid use agreement exists 05/02/2022   Mastodynia 04/13/2022   Peripheral artery disease (HCC) 07/11/2021   COVID-19 virus infection 06/21/2021   Rib pain on left side 06/06/2021   Abdominal pain 06/05/2021   Diabetes 1.5, 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   GAD (generalized anxiety disorder) 11/25/2020   Anxiety and depression 11/03/2020   DM2 (diabetes mellitus, type 2) (HCC)    Restless leg syndrome    PONV (postoperative nausea and vomiting)    Neuropathy    Lumbar stenosis    Left bundle branch block    Hypercholesterolemia    Heart murmur    History of kidney stones    H/O hiatal hernia    Essential hypertension    Dyspnea    Chronic depression    Complication of anesthesia    CKD stage 3b, GFR 30-44 ml/min (HCC)    CAD (coronary artery disease)    Bursitis of right hip    Arthritis    Anxiety    Morbid obesity (HCC) 10/07/2020   Hypoxia 10/07/2020   History of esophageal stricture 10/07/2020   CKD (chronic kidney disease), stage III (HCC) 10/07/2020   Leg weakness 10/07/2020   UTI (urinary tract infection) 10/07/2020   Chronic hypoxemic respiratory failure (HCC) 10/07/2020   Pulmonary hypertension (HCC) 10/06/2020   Chest pain, neg MI, stable CAD non obstructive on cath 10/05/20 10/04/2020   Hardening of the aorta (main artery of the heart) (HCC) 11/25/2019  Hypertensive heart disease with congestive heart failure and chronic kidney disease (HCC) 11/25/2019   Shortness of breath 06/11/2019   Fatty liver 04/08/2019   Migraine 04/08/2019   History of chronic kidney disease 04/08/2019   Acute non-recurrent sinusitis 10/12/2017   Chronic diastolic CHF (congestive heart failure) (HCC) 08/20/2017   Genetic testing  08/17/2017   Family history of ovarian cancer 08/17/2017   Family history of breast cancer 08/17/2017   Cough 04/19/2017   DOE (dyspnea on exertion) 04/19/2017   Type 2 diabetes mellitus with diabetic neuropathy, unspecified (HCC) 01/16/2017   Hypertensive heart disease with heart failure (HCC) 01/01/2017   Hyperlipidemia 01/01/2017   LBBB (left bundle branch block) 01/01/2017   Elevated liver enzymes 12/05/2016   Meralgia paraesthetica 12/05/2016   Tinnitus 12/05/2016   Spinal stenosis of lumbar region with neurogenic claudication 12/05/2016   Wound dehiscence 10/23/2016   S/P lumbar spinal fusion 08/29/2016   OSA (obstructive sleep apnea) 05/24/2016   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   Chronic low back pain without sciatica 03/14/2016   GERD (gastroesophageal reflux disease) 03/14/2016   Gout 03/14/2016   Iron  deficiency anemia due to chronic blood loss 03/14/2016   Type 2 diabetes mellitus without complication, with long-term current use of insulin  (HCC) 03/14/2016   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HCC) 12/07/2015   Restless leg 12/07/2015   Insomnia 12/07/2015   S/P lumbar laminectomy 11/26/2015   Postprocedural state 11/26/2015   Mild CAD 11/24/2015   Lumbar radiculopathy 10/15/2015   History of blood transfusion 2016   Kidney stone 04/20/2014   Stricture of esophagus 04/20/2014   Greater trochanteric bursitis of right hip 02/02/2012   Iliotibial band syndrome of right side 02/02/2012   Iliotibial band syndrome 02/02/2012   Bursitis, trochanteric 02/02/2012    Patient's Medications  New Prescriptions   No medications on file  Previous Medications   CARVEDILOL  (COREG ) 6.25 MG TABLET    Take 6.25 mg by mouth 2 (two) times daily with a meal.   DICLOFENAC  SODIUM (VOLTAREN ) 1 % GEL    Apply 2 g topically 4 (four) times daily.   DOXYCYCLINE  (VIBRA -TABS) 100 MG TABLET    Take 1 tablet (100 mg total) by mouth 2 (two) times daily.   DULOXETINE  (CYMBALTA ) 30 MG  CAPSULE    Take one 30 mg capsule of Cymbalta  in the morning and one 60 mg capsule of cymbalta  at nighttime for 4 weeks; after this, if seeing benefit without side effects, increase Cymbalta  to 60 mg twice daily   DULOXETINE  (CYMBALTA ) 60 MG CAPSULE    Take 1 capsule (60 mg total) by mouth at bedtime for 30 days, THEN 1 capsule (60 mg total) 2 (two) times daily. Take one 30 mg capsule of Cymbalta  in the morning and one 60 mg capsule of cymbalta  at nighttime for 4 weeks; after this, if seeing benefit without side effects, increase Cymbalta  to 60 mg twice daily.   MELATONIN 5 MG TABS    Take 1 tablet (5 mg total) by mouth at bedtime as needed.   MIRABEGRON  ER (MYRBETRIQ ) 25 MG TB24 TABLET    Take 2 tablets (50 mg total) by mouth daily.   MIRTAZAPINE  (REMERON ) 30 MG TABLET    Take 30 mg by mouth at bedtime.   MULTIPLE VITAMIN (MULTIVITAMIN WITH MINERALS) TABS TABLET    Take 1 tablet by mouth daily.   MULTIPLE VITAMINS-MINERALS (HAIR SKIN AND NAILS FORMULA) TABS    Take 1 tablet by mouth daily.   NOVOLOG   RELION 100 UNIT/ML INJECTION    Inject 2-20 Units into the skin 3 (three) times daily with meals.   PANTOPRAZOLE  (PROTONIX ) 40 MG TABLET    Take 1 tablet (40 mg total) by mouth daily.   POLYETHYLENE GLYCOL POWDER (GLYCOLAX /MIRALAX ) 17 GM/SCOOP POWDER    Take 17 g by mouth daily.   PRAMIPEXOLE  (MIRAPEX ) 1 MG TABLET    Take 1 mg by mouth at bedtime.   PREGABALIN  (LYRICA ) 75 MG CAPSULE    Take 1 capsule (75 mg total) by mouth 3 (three) times daily.   SENNA-DOCUSATE (SENOKOT-S) 8.6-50 MG TABLET    Take 2 tablets by mouth 2 (two) times daily.   TIRZEPATIDE (MOUNJARO) 5 MG/0.5ML PEN    Inject 7 mg into the skin once a week.   TORSEMIDE  (DEMADEX ) 20 MG TABLET    Take 40 mg by mouth daily.   TRESIBA  FLEXTOUCH 200 UNIT/ML FLEXTOUCH PEN    Inject 40 Units into the skin daily with breakfast.  Modified Medications   Modified Medication Previous Medication   CIPROFLOXACIN  (CIPRO ) 500 MG TABLET ciprofloxacin  (CIPRO )  500 MG tablet      Take 1 tablet (500 mg total) by mouth 2 (two) times daily.    Take 1 tablet (500 mg total) by mouth 2 (two) times daily.  Discontinued Medications   No medications on file   Subjective Discussed the use of AI scribe software for clinical note transcription with the patient, who gave verbal consent to proceed.   68 year old female with prior postop infection with Serratia marcescens in 2017, followed by T11-T12 discitis/osteomyelitis with epidural phlegmon with spinal cord stimulator in place, with negative cultures 11/2022 s/p course of vancomycin  and cefepime  complicated with AKI due to vancomycin , switched later to Ciprofloxacin  and doxycycline  but worsening pain after stimulator program changed with MRI showing progressive discitis/osteomyelitis s/p thoracic laminectomy with medial facetectomy T11-T12 and posterior fixation T12-L2, instrumented posterior fixation and removal of [place] stimulator ( cx NG) discharged on empiric daptomycin  and cefepime  but later cefepime  changed to ertapenem  in the setting of AMS and acute renal failure> PO doxycycline  and ciprofloxacin  suppression who is here for HFU after recent admission 4/2-4/25 for electively surgery in the setting of acute onset paraparesis. No prior fevers, chills, malaise, nausea, vomiting. Compliant with OP doxy/cipro .    She is s/p decompressive thoracic laminectomy, medial facetectomy, exploration of fusion t10-l2 with removal of segmental instrumentation, removal of loosened t10 pedicle screws and replacement of b/l l1 pedicle screws, removal and replacement of of left t12 pedicle screw. Per OR note, no evidence of significant infection. There was no pus. This looks more like a chronic pseudoarthrosis or chronic scarring or inflammation than anything else. . Or cx no organisms on gram stain and no growth to date   3/31 CT T spine  Erosive endplate changes at T9-10 and T11-12 are unchanged since 12/19/2023 and remain  concerning for discitis-osteomyelitis. Poorly defined epidural contrast enhancement at the T9-10 level, concerning for epidural abscess. MRI with and without contrast would be more definitive. Lucency about the left T10 transpedicular screws unchanged. Initially on daptomycin  and ertapenem , however antibiotics were switched to IV ceftriaxone  and ciprofloxacin  once Or Cx isolated as c acnes + elevated LFT. Hospital course significant for CT with Rotator cuff tear, managed with analgesics. US  liver with? Cirrhosis.   Patient's discharged on 4/25 to complete ceftriaxone  and ciprofloxacin  through 5/14   5/13 Here for fu with her husband. Getting IV ceftriaxone  through PICC without any concerns. Taking  ciprofloxacin  as well. No issues with antibiotics. She is not able to walk independently and mostly bound to wheelchair.   6/9 Accompanied by husband. Reports doxycycline  and ciprofloxacin  without missed doses or concerns. She has not yet seen the neurosurgeon but has an appointment scheduled for Thursday.  She continues to experience unsteadiness and is unable to walk, receiving home health care services including occupational therapy. Last week, she fell in the kitchen when an [place] on rollers moved as she tried to steady herself. She did not lose consciousness or sustain injuries, and her husband assisted her by using a blanket to help her move to a recliner. Otherwise no new changes since last visit.   Review of Systems: Denies fevers, chills. Denies nausea, vomiting or diarrhea. All systems reviewed with pertinent positives and negatives as listed above.   Past Medical History:  Diagnosis Date   Anxiety    Arthritis    Bursitis of right hip    CAD (coronary artery disease)    Cardiac catheterization June 2014 in [place] - 50% circumflex stenosis   Chest pain, neg MI, stable CAD non obstructive on cath 10/05/20 10/04/2020   Chronic diastolic heart failure (HCC) 08/20/2017   Chronic kidney  disease, stage 3 (HCC)    does not see nephrologist   Chronic low back pain without sciatica 03/14/2016   Cirrhosis of liver (HCC)    CKD (chronic kidney disease), stage III (HCC) 10/07/2020   Complication of anesthesia    Cough 04/19/2017   Overview:  Last Assessment & Plan:  Formatting of this note may be different from the original. Cough - ? ACE related with AR triggers   Plan  Patient Instructions  Discuss with your primary doctor that lisinopril  pain, need making your cough worse. May use Mucinex  DM twice daily as needed for cough and congestion Zyrtec 10 mg at bedtime as needed for drainage Saline nasal spray as needed. Lab tests today Activity as tolerated. Follow with Dr. Matilde Son in 3-4 months and As needed   Please contact office for sooner follow up if symptoms do not improve or worsen or seek emergency care    Depression    Dyspnea    with exertion    lazy lung - per  Dr Matilde Son from back issues- 06/2016   Elevated liver enzymes 12/05/2016   Essential hypertension    GERD (gastroesophageal reflux disease)    Gout 03/14/2016   Greater trochanteric bursitis of right hip 02/02/2012   H/O hiatal hernia    Heart murmur    History of blood transfusion 2016   History of esophageal stricture 10/07/2020   History of kidney stones    Hypercholesterolemia    Hypertensive heart disease with heart failure (HCC) 01/01/2017   Hypoxia 10/07/2020   Iliotibial band syndrome of right side 02/02/2012   Iron  deficiency anemia due to chronic blood loss 03/14/2016   LBBB (left bundle branch block) 01/01/2017   Left bundle branch block    Leg weakness 10/07/2020   Lumbar stenosis    Meralgia paraesthetica 12/05/2016   Mild CAD 11/24/2015   Morbid obesity (HCC) 10/07/2020   Neuropathy    OSA (obstructive sleep apnea) 05/24/2016   Overview:  Managed PULM- no CPAP   PONV (postoperative nausea and vomiting)    no N/V with patch   Restless leg syndrome    S/P lumbar laminectomy 11/26/2015   S/P  lumbar spinal fusion 08/29/2016   Tinnitus 12/05/2016   Type 2 diabetes mellitus (HCC)  UTI (urinary tract infection) 10/07/2020   Past Surgical History:  Procedure Laterality Date   ABDOMINAL HYSTERECTOMY  1983   APPENDECTOMY  Age 40   BACK SURGERY     FIRST LUMBAR FUSION/ SURGERY APRIL 2012 AND FUSION WITH INSTRUMENTATION SEPT 2012   CARDIAC CATHETERIZATION     x 2   CARPAL TUNNEL RELEASE     bil   CHOLECYSTECTOMY  1990's   COLONOSCOPY  12/12/2017   Colonic polyp status post polypectomy. Mild sigmoid diverticulosis. Otherwise normal colonoscopy to terminal ileum.   CYSTO EXTRACTION KIDNEY STONES     ESOPHAGOGASTRODUODENOSCOPY  12/12/2017   Small hiatal hernia. Mild gastritis. Status post esophageal dilatation.   EXCISION/RELEASE BURSA HIP  02/02/2012   Procedure: EXCISION/RELEASE BURSA HIP;  Surgeon: Florencia Hunter, MD;  Location: [facility];  Service: Orthopedics;  Laterality: Right;  Right Hip Bursectomy   EYE SURGERY Bilateral    cataracts   IR THORACIC DISC ASPIRATION W/IMG GUIDE  02/14/2023   KIDNEY STONE SURGERY  2008   LAMINECTOMY WITH POSTERIOR LATERAL ARTHRODESIS LEVEL 3 N/A 01/09/2024   Procedure: Posterior lateral fusion - Thoracic Seven-Thoracic Eight - Thoracic Eight-Thoracic Nine - Thoracic Nine-Thoracic Ten, Thoracic Ten-Eleven, Thoracic Eleven-Twelve, Thoracic Twelve -Lumbar One, extension of fusion and removal of Thoracic Ten screws;  Surgeon: Joaquin Mulberry, MD;  Location: [facility] OR;  Service: Neurosurgery;  Laterality: N/A;  Posterior lateral fusion - Thoracic Seven-Thora   LAMINECTOMY WITH POSTERIOR LATERAL ARTHRODESIS LEVEL 4 N/A 02/19/2023   Procedure: THORACIC TEN-LUMBAR TWO INSTRUMENTED FUSION;  Surgeon: Isadora Mar, MD;  Location: [facility] OR;  Service: Neurosurgery;  Laterality: N/A;   Left knee surgery x 2  1996   reconstruction   LUMBAR DISC SURGERY  08/2016   LUMBAR LAMINECTOMY/DECOMPRESSION MICRODISCECTOMY Right 11/26/2015   Procedure: Extraforaminal  Microdiscectomy  - Lumbar two-three- right;  Surgeon: Isadora Mar, MD;  Location: [facility];  Service: Neurosurgery;  Laterality: Right;  right    LUMBAR WOUND DEBRIDEMENT N/A 10/25/2016   Procedure: Lumbar wound revision;  Surgeon: Isadora Mar, MD;  Location: [facility] OR;  Service: Neurosurgery;  Laterality: N/A;  Lumbar wound revision   Right shoulder surgery  2010   spur   RIGHT/LEFT HEART CATH AND CORONARY ANGIOGRAPHY N/A 10/05/2020   Procedure: RIGHT/LEFT HEART CATH AND CORONARY ANGIOGRAPHY;  Surgeon: [place], Peter M, MD;  Location: [facility] INVASIVE CV LAB;  Service: Cardiovascular;  Laterality: N/A;   SPINAL CORD STIMULATOR REMOVAL  02/19/2023   Procedure: LUMBAR SPINAL CORD STIMULATOR REMOVAL;  Surgeon: Isadora Mar, MD;  Location: [facility] OR;  Service: Neurosurgery;;    Social History   Tobacco Use   Smoking status: Never   Smokeless tobacco: Never   Tobacco comments:    Prior secondhand smoke  Vaping Use   Vaping status: Never Used  Substance Use Topics   Alcohol use: Not Currently   Drug use: No    Family History  Problem Relation Age of Onset   Lung cancer Father        smoked   Hypertension Father    Stroke Father    Heart failure Mother    Bone cancer Sister    Asthma Sister     Allergies  Allergen Reactions   Atorvastatin Nausea And Vomiting and Other (See Comments)    MYALGIAS   Oxycodone  Other (See Comments)    Confusion, staring episodes   Talwin [Pentazocine] Other (See Comments)    headache   Cefepime  Other (See Comments)  Presumed AMS/neurotoxicity in the setting of AKI   Gabapentin  Swelling   Other Nausea Only    UNSPECIFIED Anesthesia    Health Maintenance  Topic Date Due   OPHTHALMOLOGY EXAM  Never done   Diabetic kidney evaluation - Urine ACR  Never done   MAMMOGRAM  Never done   COVID-19 Vaccine (3 - Pfizer risk series) 12/13/2020   DEXA SCAN  Never done   FOOT EXAM  04/14/2023   INFLUENZA VACCINE  05/09/2024   Medicare Annual Wellness  (AWV)  05/27/2024   HEMOGLOBIN A1C  07/03/2024   Diabetic kidney evaluation - eGFR measurement  01/27/2025   Pneumonia Vaccine 65+ Years old (4 of 4 - PCV20 or PCV21) 04/01/2027   Colonoscopy  12/13/2027   DTaP/Tdap/Td (4 - Td or Tdap) 03/31/2032   Hepatitis C Screening  Completed   Zoster Vaccines- Shingrix  Completed   HPV VACCINES  Aged Out   Meningococcal B Vaccine  Aged Out    Objective: BP 126/76   Pulse 88   Temp 97.9 F (36.6 C) (Temporal)   Ht 5' 1 (1.549 m)   Wt 186 lb (84.4 kg)   SpO2 97%   BMI 35.14 kg/m    Physical Exam Constitutional:      Appearance: Normal appearance. Morbidly obese  HENT:     Head: Normocephalic and atraumatic.      Mouth: Mucous membranes are moist.  Eyes:    Conjunctiva/sclera: Conjunctivae normal.     Pupils: Pupils are equal, round, and b/l symmetrical    Cardiovascular:     Rate and Rhythm: Normal rate and regular rhythm.     Heart sounds:  Pulmonary:     Effort: Pulmonary effort is normal.     Breath sounds:   Abdominal:     General: Non distended     Palpations:  Musculoskeletal:        General:sitting in the wheel chair   Skin:    General: Skin is warm and dry.     Comments: Posterior thoracolumbar surgical wound has healed   Neurological:     General:     Mental Status: awake, alert and oriented to person, place, and time.   Psychiatric:        Mood and Affect: Mood normal.   Lab Results Lab Results  Component Value Date   WBC 4.0 01/31/2024   HGB 8.6 (L) 01/31/2024   HCT 25.6 (L) 01/31/2024   MCV 88.3 01/31/2024   PLT 169 01/31/2024    Lab Results  Component Value Date   CREATININE 1.37 (H) 01/28/2024   BUN 25 (H) 01/28/2024   NA 132 (L) 01/28/2024   K 3.8 01/28/2024   CL 95 (L) 01/28/2024   CO2 28 01/28/2024    Lab Results  Component Value Date   ALT 37 01/28/2024   AST 36 01/28/2024   GGT 207 (H) 01/16/2024   ALKPHOS 263 (H) 01/28/2024   BILITOT 0.5 01/28/2024    Lab Results   Component Value Date   CHOL 178 11/16/2021   HDL 51 11/16/2021   LDLCALC 104 (H) 11/16/2021   TRIG 132 11/16/2021   CHOLHDL 3.5 11/16/2021   No results found for: LABRPR, RPRTITER No results found for: HIV1RNAQUANT, HIV1RNAVL, CD4TABS   Microbiology Results for orders placed or performed during the hospital encounter of 01/07/24  MRSA Next Gen by PCR, Nasal     Status: None   Collection Time: 01/09/24 11:35 AM   Specimen: Nasal Mucosa; Nasal  Swab  Result Value Ref Range Status   MRSA by PCR Next Gen NOT DETECTED NOT DETECTED Final    Comment: (NOTE) The GeneXpert MRSA Assay (FDA approved for NASAL specimens only), is one component of a comprehensive MRSA colonization surveillance program. It is not intended to diagnose MRSA infection nor to guide or monitor treatment for MRSA infections. Test performance is not FDA approved in patients less than 35 years old. Performed at [facility], [address], [place], [place] [address]   Aerobic/Anaerobic Culture w Gram Stain (surgical/deep wound)     Status: None   Collection Time: 01/09/24  4:34 PM   Specimen: Intervertebral Disc; Tissue  Result Value Ref Range Status   Specimen Description WOUND  Final   Special Requests INTERVERTERAL DISC 9,10 PT ON ANCEF   Final   Gram Stain NO WBC SEEN NO ORGANISMS SEEN   Final   Culture   Final    No growth aerobically or anaerobically. Performed at [facility], [address] [place], [place] [address]    Report Status 01/14/2024 FINAL  Final  Aerobic/Anaerobic Culture w Gram Stain (surgical/deep wound)     Status: None   Collection Time: 01/09/24  4:44 PM   Specimen: Intervertebral Disc; Tissue  Result Value Ref Range Status   Specimen Description WOUND  Final   Special Requests INTERVERTEBRAL DISC 9,10 SWAB PT ON ANCEF   Final   Gram Stain NO WBC SEEN NO ORGANISMS SEEN   Final   Culture   Final    RARE CUTIBACTERIUM ACNES Standardized susceptibility testing  for this organism is not available. Performed at [facility], [address], [place], [place] [address]    Report Status 01/15/2024 FINAL  Final   Imaging CT T spine 01/08/24 1. Severe thecal sac attenuation at the T9-10 level by soft tissue density material, likely epidural phlegmon/abscess. 2. 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 3. Unchanged endplate erosion at T9-10 and T11-12  Assessment/Plan # Thoracic epidural abscess complicated with hardware  - ciprofloxacin  and ceftriaxone  through 5/14> PO doxycycline  and ciprofloxacin  thereafter  Plan  - continue doxycycline  and ciprofloxacin  as is. She is hesitant to stop antibiotics anytime soon as she cannot risk another recurrence of infection - Fu in 3 months  - Fu with Nsx as planned   # Medication management  - Labs - EKG done at the office 5/13 Qtc 463  # Morbid Obesity  - will benefit from measures to loose weight   # Chronic LBP with radiculopathy/Lower extremity weakness - followed by pain medicine, last seen 03/05/24  # Liver cirrhosis - 03/14/23 acute hep panel negative, needs HCC screening with US  liver every 6 months  - Follows [facility], last seen 5/30  I spent 23 minutes involved in face-to-face and non-face-to-face activities for this patient on the day of the visit. Professional time spent includes the following activities: Preparing to see the patient (review of tests), reviewed last notes from pain medicine and hepatology, Performing a medically appropriate examination and evaluation , Ordering medications and tests, Documenting clinical information in the EMR, Independently interpreting results (not separately reported), Communicating results to the patient/husband, Counseling and educating the patient/husband and Care coordination (not separately reported).   Of note, portions of this note may have been created with voice  recognition software. While this note has been edited for accuracy, occasional wrong-word or 'sound-a-like' substitutions may have occurred due  to the inherent limitations of voice recognition software.   Melvina Stage, MD [facility] 03/17/2024, 1:57 PM

## 2024-03-18 ENCOUNTER — Ambulatory Visit: Payer: Self-pay | Admitting: Infectious Diseases

## 2024-03-18 LAB — CBC
HCT: 32.8 % — ABNORMAL LOW (ref 35.0–45.0)
Hemoglobin: 10.2 g/dL — ABNORMAL LOW (ref 11.7–15.5)
MCH: 27.2 pg (ref 27.0–33.0)
MCHC: 31.1 g/dL — ABNORMAL LOW (ref 32.0–36.0)
MCV: 87.5 fL (ref 80.0–100.0)
MPV: 9.7 fL (ref 7.5–12.5)
Platelets: 132 10*3/uL — ABNORMAL LOW (ref 140–400)
RBC: 3.75 10*6/uL — ABNORMAL LOW (ref 3.80–5.10)
RDW: 15 % (ref 11.0–15.0)
WBC: 7.4 10*3/uL (ref 3.8–10.8)

## 2024-03-18 LAB — COMPREHENSIVE METABOLIC PANEL WITH GFR
AG Ratio: 1 (calc) (ref 1.0–2.5)
ALT: 33 U/L — ABNORMAL HIGH (ref 6–29)
AST: 45 U/L — ABNORMAL HIGH (ref 10–35)
Albumin: 3.3 g/dL — ABNORMAL LOW (ref 3.6–5.1)
Alkaline phosphatase (APISO): 319 U/L — ABNORMAL HIGH (ref 37–153)
BUN/Creatinine Ratio: 29 (calc) — ABNORMAL HIGH (ref 6–22)
BUN: 34 mg/dL — ABNORMAL HIGH (ref 7–25)
CO2: 32 mmol/L (ref 20–32)
Calcium: 8.9 mg/dL (ref 8.6–10.4)
Chloride: 104 mmol/L (ref 98–110)
Creat: 1.18 mg/dL — ABNORMAL HIGH (ref 0.50–1.05)
Globulin: 3.2 g/dL (ref 1.9–3.7)
Glucose, Bld: 200 mg/dL — ABNORMAL HIGH (ref 65–99)
Potassium: 4.3 mmol/L (ref 3.5–5.3)
Sodium: 142 mmol/L (ref 135–146)
Total Bilirubin: 0.4 mg/dL (ref 0.2–1.2)
Total Protein: 6.5 g/dL (ref 6.1–8.1)
eGFR: 51 mL/min/{1.73_m2} — ABNORMAL LOW (ref >=60)

## 2024-03-18 LAB — C-REACTIVE PROTEIN: CRP: 7.4 mg/L (ref <8.0)

## 2024-03-24 NOTE — Telephone Encounter (Signed)
 Patient has not read MyChart message, called and relayed results per provider. Discussed mildly low platelets, mildly elevated LFTs, and normal CRP.   She states she follows with an endocrinologist for her diabetes.   She has questions about how to get refills of mirtazapine  and senna that were prescribed by the hospitalist. Recommended she reach out to her PCP for mirtazapine  and notified her that senna is available OTC.   Patient verbalized understanding and has no further questions.    Nayquan Evinger, BSN, RN

## 2024-03-29 ENCOUNTER — Other Ambulatory Visit: Payer: Self-pay | Admitting: Cardiology

## 2024-04-04 ENCOUNTER — Ambulatory Visit (HOSPITAL_COMMUNITY)
Admission: RE | Admit: 2024-04-04 | Discharge: 2024-04-04 | Disposition: A | Discharge status: Home / Self Care | Source: Ambulatory Visit | Attending: Endocrinology | Admitting: Endocrinology

## 2024-04-04 DIAGNOSIS — I358 Other nonrheumatic aortic valve disorders: Secondary | ICD-10-CM | POA: Diagnosis present

## 2024-04-04 LAB — ECHOCARDIOGRAM COMPLETE
Area-P 1/2: 3.12 cm2
S' Lateral: 2.8 cm

## 2024-04-08 ENCOUNTER — Telehealth: Payer: Self-pay

## 2024-04-08 NOTE — Telephone Encounter (Addendum)
 Patient called office today stating she had concerns regarding shoulder. Was seen by 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 Will call [facility] and request CT and notes be faxed to office for review.  Patient would like to know if she should be switched to IV antibiotics for agressive treatment. Is scheduled to see Dr. Dea this Thursday to discuss concerns. Lorenda CHRISTELLA Code, RMA

## 2024-04-09 NOTE — Telephone Encounter (Signed)
 Received records place in [place] basket.  Rickayla Wieland ONEIDA Ligas, CMA

## 2024-04-09 NOTE — Telephone Encounter (Signed)
 Called [facility] and requested last note and CT report be faxed to provider. Staff will fax this before appt with ID. Lorenda CHRISTELLA Code, RMA

## 2024-04-10 ENCOUNTER — Ambulatory Visit: Admitting: Infectious Diseases

## 2024-04-10 ENCOUNTER — Encounter: Payer: Self-pay | Admitting: Infectious Diseases

## 2024-04-10 ENCOUNTER — Other Ambulatory Visit: Payer: Self-pay

## 2024-04-10 VITALS — BP 158/83 | HR 93 | Temp 97.9°F

## 2024-04-10 DIAGNOSIS — M25511 Pain in right shoulder: Secondary | ICD-10-CM

## 2024-04-10 DIAGNOSIS — M5410 Radiculopathy, site unspecified: Secondary | ICD-10-CM

## 2024-04-10 DIAGNOSIS — T85192D Other mechanical complication of implanted electronic neurostimulator (electrode) of spinal cord, subsequent encounter: Secondary | ICD-10-CM | POA: Diagnosis not present

## 2024-04-10 DIAGNOSIS — M4645 Discitis, unspecified, thoracolumbar region: Secondary | ICD-10-CM

## 2024-04-10 DIAGNOSIS — G061 Intraspinal abscess and granuloma: Secondary | ICD-10-CM

## 2024-04-10 DIAGNOSIS — R531 Weakness: Secondary | ICD-10-CM

## 2024-04-10 DIAGNOSIS — Z79899 Other long term (current) drug therapy: Secondary | ICD-10-CM

## 2024-04-10 NOTE — Progress Notes (Signed)
 Patient Active Problem List   Diagnosis Date Noted   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 02/19/2024   Medication management 02/19/2024   Presence of retained hardware 02/19/2024   Neurogenic bladder 02/10/2024   Neuropathic pain 02/06/2024   Chronic pain syndrome 01/25/2024   Thoracic myelopathy 01/11/2024   Thoracic spondylosis with myelopathy 01/07/2024   Hepatic cirrhosis (HCC) 11/22/2023   ACS (acute coronary syndrome) (HCC) 04/06/2023   Acute sciatica 04/06/2023   Dyslipidemia 04/06/2023   Hypertensive heart and chronic kidney disease 04/06/2023   Hyponatremia 04/06/2023   Hypothermia 04/06/2023   Renal insufficiency 04/06/2023   SIRS (systemic inflammatory response syndrome) (HCC) 04/06/2023   Slurred speech 04/06/2023   Thrombocytopenia (HCC) 04/06/2023   Ventricular tachycardia, nonsustained (HCC) 04/06/2023   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   Acute renal failure (ARF) (HCC) 03/13/2023   Elevated LFTs 03/13/2023   AKI (acute kidney injury) (HCC) 02/20/2023   Status post thoracic spinal fusion 02/19/2023   Discitis 02/13/2023   Malfunction of spinal cord stimulator (HCC) 02/13/2023   Serratia 02/13/2023   Diabetes mellitus without complication (HCC) 02/13/2023   Osteomyelitis (HCC) 02/05/2023   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   Infection of spinal cord stimulator (HCC) 12/07/2022   Lactic acidosis 12/06/2022   Hypomagnesemia 12/06/2022   Anemia of chronic disease 12/06/2022   Diskitis 12/05/2022   Status post insertion of spinal cord stimulator 11/22/2022   Acute thoracic back pain 06/22/2022   Other long term (current) drug therapy 06/22/2022   Lumbar spondylosis 06/22/2022   Thoracic spondylosis 06/22/2022   Bilateral sacroiliitis (HCC) 06/22/2022   Chronic, continuous use of opioids 05/31/2022   Pain medication agreement 05/02/2022   Postlaminectomy syndrome of lumbar region 05/02/2022   Radiculopathy of thoracolumbar region 05/02/2022   Class 2  severe obesity due to excess calories with serious comorbidity and body mass index (BMI) of 37.0 to 37.9 in adult [facility]) 05/02/2022   Opioid use agreement exists 05/02/2022   Mastodynia 04/13/2022   Peripheral artery disease (HCC) 07/11/2021   COVID-19 virus infection 06/21/2021   Rib pain on left side 06/06/2021   Abdominal pain 06/05/2021   Diabetes 1.5, 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   GAD (generalized anxiety disorder) 11/25/2020   Anxiety and depression 11/03/2020   DM2 (diabetes mellitus, type 2) (HCC)    Restless leg syndrome    PONV (postoperative nausea and vomiting)    Neuropathy    Lumbar stenosis    Left bundle branch block    Hypercholesterolemia    Heart murmur    History of kidney stones    H/O hiatal hernia    Essential hypertension    Dyspnea    Chronic depression    Complication of anesthesia    CKD stage 3b, GFR 30-44 ml/min (HCC)    CAD (coronary artery disease)    Bursitis of right hip    Arthritis    Anxiety    Morbid obesity (HCC) 10/07/2020   Hypoxia 10/07/2020   History of esophageal stricture 10/07/2020   CKD (chronic kidney disease), stage III (HCC) 10/07/2020   Leg weakness 10/07/2020   UTI (urinary tract infection) 10/07/2020   Chronic hypoxemic respiratory failure (HCC) 10/07/2020   Pulmonary hypertension (HCC) 10/06/2020   Chest pain, neg MI, stable CAD non obstructive on cath 10/05/20 10/04/2020   Hardening of the aorta (main artery of the heart) (HCC) 11/25/2019   Hypertensive heart disease with congestive heart failure and chronic  kidney disease (HCC) 11/25/2019   Shortness of breath 06/11/2019   Fatty liver 04/08/2019   Migraine 04/08/2019   History of chronic kidney disease 04/08/2019   Acute non-recurrent sinusitis 10/12/2017   Chronic diastolic CHF (congestive heart failure) (HCC) 08/20/2017   Genetic testing 08/17/2017   Family history of ovarian cancer 08/17/2017   Family history of  breast cancer 08/17/2017   Cough 04/19/2017   DOE (dyspnea on exertion) 04/19/2017   Type 2 diabetes mellitus with diabetic neuropathy, unspecified (HCC) 01/16/2017   Hypertensive heart disease with heart failure (HCC) 01/01/2017   Hyperlipidemia 01/01/2017   LBBB (left bundle branch block) 01/01/2017   Elevated liver enzymes 12/05/2016   Meralgia paraesthetica 12/05/2016   Tinnitus 12/05/2016   Spinal stenosis of lumbar region with neurogenic claudication 12/05/2016   Wound dehiscence 10/23/2016   S/P lumbar spinal fusion 08/29/2016   OSA (obstructive sleep apnea) 05/24/2016   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   Chronic low back pain without sciatica 03/14/2016   GERD (gastroesophageal reflux disease) 03/14/2016   Gout 03/14/2016   Iron  deficiency anemia due to chronic blood loss 03/14/2016   Type 2 diabetes mellitus without complication, with long-term current use of insulin  (HCC) 03/14/2016   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HCC) 12/07/2015   Restless leg 12/07/2015   Insomnia 12/07/2015   S/P lumbar laminectomy 11/26/2015   Postprocedural state 11/26/2015   Mild CAD 11/24/2015   Lumbar radiculopathy 10/15/2015   History of blood transfusion 2016   Kidney stone 04/20/2014   Stricture of esophagus 04/20/2014   Greater trochanteric bursitis of right hip 02/02/2012   Iliotibial band syndrome of right side 02/02/2012   Iliotibial band syndrome 02/02/2012   Bursitis, trochanteric 02/02/2012    Patient's Medications  New Prescriptions   No medications on file  Previous Medications   CARVEDILOL  (COREG ) 6.25 MG TABLET    Take 6.25 mg by mouth 2 (two) times daily with a meal.   CIPROFLOXACIN  (CIPRO ) 500 MG TABLET    Take 1 tablet (500 mg total) by mouth 2 (two) times daily.   DICLOFENAC  SODIUM (VOLTAREN ) 1 % GEL    Apply 2 g topically 4 (four) times daily.   DOXYCYCLINE  (VIBRA -TABS) 100 MG TABLET    Take 1 tablet (100 mg total) by mouth 2 (two) times daily.    DULOXETINE  (CYMBALTA ) 30 MG CAPSULE    Take one 30 mg capsule of Cymbalta  in the morning and one 60 mg capsule of cymbalta  at nighttime for 4 weeks; after this, if seeing benefit without side effects, increase Cymbalta  to 60 mg twice daily   DULOXETINE  (CYMBALTA ) 60 MG CAPSULE    Take 1 capsule (60 mg total) by mouth at bedtime for 30 days, THEN 1 capsule (60 mg total) 2 (two) times daily. Take one 30 mg capsule of Cymbalta  in the morning and one 60 mg capsule of cymbalta  at nighttime for 4 weeks; after this, if seeing benefit without side effects, increase Cymbalta  to 60 mg twice daily.   MELATONIN 5 MG TABS    Take 1 tablet (5 mg total) by mouth at bedtime as needed.   MIRABEGRON  ER (MYRBETRIQ ) 25 MG TB24 TABLET    Take 2 tablets (50 mg total) by mouth daily.   MIRTAZAPINE  (REMERON ) 30 MG TABLET    Take 30 mg by mouth at bedtime.   MULTIPLE VITAMIN (MULTIVITAMIN WITH MINERALS) TABS TABLET    Take 1 tablet by mouth daily.   MULTIPLE VITAMINS-MINERALS (HAIR SKIN AND NAILS FORMULA)  TABS    Take 1 tablet by mouth daily.   NOVOLOG  RELION 100 UNIT/ML INJECTION    Inject 2-20 Units into the skin 3 (three) times daily with meals.   PANTOPRAZOLE  (PROTONIX ) 40 MG TABLET    Take 1 tablet by mouth once daily   POLYETHYLENE GLYCOL POWDER (GLYCOLAX /MIRALAX ) 17 GM/SCOOP POWDER    Take 17 g by mouth daily.   PRAMIPEXOLE  (MIRAPEX ) 1 MG TABLET    Take 1 mg by mouth at bedtime.   PREGABALIN  (LYRICA ) 75 MG CAPSULE    Take 1 capsule (75 mg total) by mouth 3 (three) times daily.   SENNA-DOCUSATE (SENOKOT-S) 8.6-50 MG TABLET    Take 2 tablets by mouth 2 (two) times daily.   TIRZEPATIDE (MOUNJARO) 5 MG/0.5ML PEN    Inject 7 mg into the skin once a week.   TORSEMIDE  (DEMADEX ) 20 MG TABLET    Take 40 mg by mouth daily.   TRESIBA  FLEXTOUCH 200 UNIT/ML FLEXTOUCH PEN    Inject 40 Units into the skin daily with breakfast.  Modified Medications   No medications on file  Discontinued Medications   No medications on file    Subjective Discussed the use of AI scribe software for clinical note transcription with the patient, who gave verbal consent to proceed.   68 year old female with prior postop infection with Serratia marcescens in 2017, followed by T11-T12 discitis/osteomyelitis with epidural phlegmon with spinal cord stimulator in place, with negative cultures 11/2022 s/p course of vancomycin  and cefepime  complicated with AKI due to vancomycin , switched later to Ciprofloxacin  and doxycycline  but worsening pain after stimulator program changed with MRI showing progressive discitis/osteomyelitis s/p thoracic laminectomy with medial facetectomy T11-T12 and posterior fixation T12-L2, instrumented posterior fixation and removal of [place] stimulator ( cx NG) discharged on empiric daptomycin  and cefepime  but later cefepime  changed to ertapenem  in the setting of AMS and acute renal failure> PO doxycycline  and ciprofloxacin  suppression who is here for HFU after recent admission 4/2-4/25 for electively surgery in the setting of acute onset paraparesis. No prior fevers, chills, malaise, nausea, vomiting. Compliant with OP doxy/cipro .    She is s/p decompressive thoracic laminectomy, medial facetectomy, exploration of fusion t10-l2 with removal of segmental instrumentation, removal of loosened t10 pedicle screws and replacement of b/l l1 pedicle screws, removal and replacement of of left t12 pedicle screw. Per OR note, no evidence of significant infection. There was no pus. This looks more like a chronic pseudoarthrosis or chronic scarring or inflammation than anything else. . Or cx no organisms on gram stain and no growth to date   3/31 CT T spine  Erosive endplate changes at T9-10 and T11-12 are unchanged since 12/19/2023 and remain concerning for discitis-osteomyelitis. Poorly defined epidural contrast enhancement at the T9-10 level, concerning for epidural abscess. MRI with and without contrast would be more definitive. Lucency  about the left T10 transpedicular screws unchanged. Initially on daptomycin  and ertapenem , however antibiotics were switched to IV ceftriaxone  and ciprofloxacin  once Or Cx isolated as c acnes + elevated LFT. Hospital course significant for CT with Rotator cuff tear, managed with analgesics. US  liver with? Cirrhosis.   Patient's discharged on 4/25 to complete ceftriaxone  and ciprofloxacin  through 5/14   5/13 Here for fu with her husband. Getting IV ceftriaxone  through PICC without any concerns. Taking ciprofloxacin  as well. No issues with antibiotics. She is not able to walk independently and mostly bound to wheelchair.   6/9 Accompanied by husband. Reports doxycycline  and ciprofloxacin  without missed doses or  concerns. She has not yet seen the neurosurgeon but has an appointment scheduled for Thursday.  She continues to experience unsteadiness and is unable to walk, receiving home health care services including occupational therapy. Last week, she fell in the kitchen when an [place] on rollers moved as she tried to steady herself. She did not lose consciousness or sustain injuries, and her husband assisted her by using a blanket to help her move to a recliner. Otherwise no new changes since last visit.   7/3 Accompanied by husband. She was seen at Orthopedics Dr Melita 6/30 for severe rt shoulder pain, referred by Dr Joshua when seen with him. Xray was done which showed progressive collapse of the humeral head most consistent with avascular necrosis, The humeral head is completely flattened now ( no results to review). Infectious cause although not ruled out by aspiration, it was considered less likely and avascular necrosis likely related to steroid use was considered more likely. No plans for shoulder replacement anytime soon as she is also on prolonged antibiotics for her back infection.   She reports worsening rt shoulder pain since hospitalization in April, described as feeling like 'somebody's  ripping my arm off,' She recently completed a five-day course of prednisone for shoulder pain. She denies frequent steroid use. Denies fevers, chills, nausea, vomiting, or diarrhea, and no other joint pain except occasional popping in her reconstructed knee. ROM of rt shoulder is limited due to pain, denies  swelling or erythema or warmth in the rt shoulder. Next fu with supple is in 6 months .   Review of Systems: All systems reviewed with pertinent positives and negatives as listed above.   Past Medical History:  Diagnosis Date   Anxiety    Arthritis    Bursitis of right hip    CAD (coronary artery disease)    Cardiac catheterization June 2014 in [place] - 50% circumflex stenosis   Chest pain, neg MI, stable CAD non obstructive on cath 10/05/20 10/04/2020   Chronic diastolic heart failure (HCC) 08/20/2017   Chronic kidney disease, stage 3 (HCC)    does not see nephrologist   Chronic low back pain without sciatica 03/14/2016   Cirrhosis of liver (HCC)    CKD (chronic kidney disease), stage III (HCC) 10/07/2020   Complication of anesthesia    Cough 04/19/2017   Overview:  Last Assessment & Plan:  Formatting of this note may be different from the original. Cough - ? ACE related with AR triggers   Plan  Patient Instructions  Discuss with your primary doctor that lisinopril  pain, need making your cough worse. May use Mucinex  DM twice daily as needed for cough and congestion Zyrtec 10 mg at bedtime as needed for drainage Saline nasal spray as needed. Lab tests today Activity as tolerated. Follow with Dr. Shellia in 3-4 months and As needed   Please contact office for sooner follow up if symptoms do not improve or worsen or seek emergency care    Depression    Dyspnea    with exertion    lazy lung - per  Dr Shellia from back issues- 06/2016   Elevated liver enzymes 12/05/2016   Essential hypertension    GERD (gastroesophageal reflux disease)    Gout 03/14/2016   Greater trochanteric bursitis of  right hip 02/02/2012   H/O hiatal hernia    Heart murmur    History of blood transfusion 2016   History of esophageal stricture 10/07/2020   History of kidney stones  Hypercholesterolemia    Hypertensive heart disease with heart failure (HCC) 01/01/2017   Hypoxia 10/07/2020   Iliotibial band syndrome of right side 02/02/2012   Iron  deficiency anemia due to chronic blood loss 03/14/2016   LBBB (left bundle branch block) 01/01/2017   Left bundle branch block    Leg weakness 10/07/2020   Lumbar stenosis    Meralgia paraesthetica 12/05/2016   Mild CAD 11/24/2015   Morbid obesity (HCC) 10/07/2020   Neuropathy    OSA (obstructive sleep apnea) 05/24/2016   Overview:  Managed PULM- no CPAP   PONV (postoperative nausea and vomiting)    no N/V with patch   Restless leg syndrome    S/P lumbar laminectomy 11/26/2015   S/P lumbar spinal fusion 08/29/2016   Tinnitus 12/05/2016   Type 2 diabetes mellitus (HCC)    UTI (urinary tract infection) 10/07/2020   Past Surgical History:  Procedure Laterality Date   ABDOMINAL HYSTERECTOMY  1983   APPENDECTOMY  Age 47   BACK SURGERY     FIRST LUMBAR FUSION/ SURGERY APRIL 2012 AND FUSION WITH INSTRUMENTATION SEPT 2012   CARDIAC CATHETERIZATION     x 2   CARPAL TUNNEL RELEASE     bil   CHOLECYSTECTOMY  1990's   COLONOSCOPY  12/12/2017   Colonic polyp status post polypectomy. Mild sigmoid diverticulosis. Otherwise normal colonoscopy to terminal ileum.   CYSTO EXTRACTION KIDNEY STONES     ESOPHAGOGASTRODUODENOSCOPY  12/12/2017   Small hiatal hernia. Mild gastritis. Status post esophageal dilatation.   EXCISION/RELEASE BURSA HIP  02/02/2012   Procedure: EXCISION/RELEASE BURSA HIP;  Surgeon: Tanda DELENA Heading, MD;  Location: [facility];  Service: Orthopedics;  Laterality: Right;  Right Hip Bursectomy   EYE SURGERY Bilateral    cataracts   IR THORACIC DISC ASPIRATION W/IMG GUIDE  02/14/2023   KIDNEY STONE SURGERY  2008   LAMINECTOMY WITH POSTERIOR  LATERAL ARTHRODESIS LEVEL 3 N/A 01/09/2024   Procedure: Posterior lateral fusion - Thoracic Seven-Thoracic Eight - Thoracic Eight-Thoracic Nine - Thoracic Nine-Thoracic Ten, Thoracic Ten-Eleven, Thoracic Eleven-Twelve, Thoracic Twelve -Lumbar One, extension of fusion and removal of Thoracic Ten screws;  Surgeon: Joshua Alm Hamilton, MD;  Location: [facility] OR;  Service: Neurosurgery;  Laterality: N/A;  Posterior lateral fusion - Thoracic Seven-Thora   LAMINECTOMY WITH POSTERIOR LATERAL ARTHRODESIS LEVEL 4 N/A 02/19/2023   Procedure: THORACIC TEN-LUMBAR TWO INSTRUMENTED FUSION;  Surgeon: Joshua Alm RAMAN, MD;  Location: [facility] OR;  Service: Neurosurgery;  Laterality: N/A;   Left knee surgery x 2  1996   reconstruction   LUMBAR DISC SURGERY  08/2016   LUMBAR LAMINECTOMY/DECOMPRESSION MICRODISCECTOMY Right 11/26/2015   Procedure: Extraforaminal Microdiscectomy  - Lumbar two-three- right;  Surgeon: Alm RAMAN Joshua, MD;  Location: [facility];  Service: Neurosurgery;  Laterality: Right;  right    LUMBAR WOUND DEBRIDEMENT N/A 10/25/2016   Procedure: Lumbar wound revision;  Surgeon: Alm RAMAN Joshua, MD;  Location: [facility] OR;  Service: Neurosurgery;  Laterality: N/A;  Lumbar wound revision   Right shoulder surgery  2010   spur   RIGHT/LEFT HEART CATH AND CORONARY ANGIOGRAPHY N/A 10/05/2020   Procedure: RIGHT/LEFT HEART CATH AND CORONARY ANGIOGRAPHY;  Surgeon: [place], Peter M, MD;  Location: [facility] INVAS[facility] CV LAB;  Service: Cardiovascular;  Laterality: N/A;   SPINAL CORD STIMULATOR REMOVAL  02/19/2023   Procedure: LUMBAR SPINAL CORD STIMULATOR REMOVAL;  Surgeon: Joshua Alm RAMAN, MD;  Location: [facility] OR;  Service: Neurosurgery;;    Social History   Tobacco Use   Smoking  status: Never   Smokeless tobacco: Never   Tobacco comments:    Prior secondhand smoke  Vaping Use   Vaping status: Never Used  Substance Use Topics   Alcohol use: Not Currently   Drug use: No    Family History  Problem Relation Age of Onset   Lung cancer  Father        smoked   Hypertension Father    Stroke Father    Heart failure Mother    Bone cancer Sister    Asthma Sister     Allergies  Allergen Reactions   Atorvastatin Nausea And Vomiting and Other (See Comments)    MYALGIAS   Oxycodone  Other (See Comments)    Confusion, staring episodes   Talwin [Pentazocine] Other (See Comments)    headache   Cefepime  Other (See Comments)    Presumed AMS/neurotoxicity in the setting of AKI   Gabapentin  Swelling   Other Nausea Only    UNSPECIFIED Anesthesia    Health Maintenance  Topic Date Due   OPHTHALMOLOGY EXAM  Never done   Diabetic kidney evaluation - Urine ACR  Never done   MAMMOGRAM  Never done   COVID-19 Vaccine (3 - Pfizer risk series) 12/13/2020   DEXA SCAN  Never done   FOOT EXAM  04/14/2023   Hepatitis B Vaccines (3 of 3 - Risk 3-dose series) 02/26/2024   Medicare Annual Wellness (AWV)  05/27/2024   INFLUENZA VACCINE  05/09/2024   HEMOGLOBIN A1C  07/03/2024   Diabetic kidney evaluation - eGFR measurement  03/17/2025   Pneumococcal Vaccine: 50+ Years (4 of 4 - PCV20 or PCV21) 04/01/2027   Colonoscopy  12/13/2027   DTaP/Tdap/Td (4 - Td or Tdap) 03/31/2032   Hepatitis C Screening  Completed   Zoster Vaccines- Shingrix  Completed   HPV VACCINES  Aged Out   Meningococcal B Vaccine  Aged Out    Objective: BP (!) 158/83   Pulse 93   Temp 97.9 F (36.6 C) (Temporal)   SpO2 98%   Physical Exam Constitutional:      Appearance: Normal appearance. Morbidly obese  HENT:     Head: Normocephalic and atraumatic.      Mouth: Mucous membranes are moist.  Eyes:    Conjunctiva/sclera: Conjunctivae normal.     Pupils: Pupils are equal, round, and b/l symmetrical    Cardiovascular:     Rate and Rhythm: Normal rate and regular rhythm.     Heart sounds:  Pulmonary:     Effort: Pulmonary effort is normal.     Breath sounds:   Abdominal:     General: Non distended     Palpations:  Musculoskeletal:         General:sitting in the wheel chair, rt shoulder with no swelling, erythema, warmth, no signs of infection.  ROM of rt shoulder restricted due to pain.   Skin:    General: Skin is warm and dry.     Comments: Posterior thoracolumbar surgical wound has healed   Neurological:     General:     Mental Status: awake, alert and oriented to person, place, and time.   Psychiatric:        Mood and Affect: Mood normal.   Lab Results Lab Results  Component Value Date   WBC 7.4 03/17/2024   HGB 10.2 (L) 03/17/2024   HCT 32.8 (L) 03/17/2024   MCV 87.5 03/17/2024   PLT 132 (L) 03/17/2024    Lab Results  Component Value Date  CREATININE 1.18 (H) 03/17/2024   BUN 34 (H) 03/17/2024   NA 142 03/17/2024   K 4.3 03/17/2024   CL 104 03/17/2024   CO2 32 03/17/2024    Lab Results  Component Value Date   ALT 33 (H) 03/17/2024   AST 45 (H) 03/17/2024   GGT 207 (H) 01/16/2024   ALKPHOS 263 (H) 01/28/2024   BILITOT 0.4 03/17/2024    Lab Results  Component Value Date   CHOL 178 11/16/2021   HDL 51 11/16/2021   LDLCALC 104 (H) 11/16/2021   TRIG 132 11/16/2021   CHOLHDL 3.5 11/16/2021   No results found for: LABRPR, RPRTITER No results found for: HIV1RNAQUANT, HIV1RNAVL, CD4TABS   Microbiology Results for orders placed or performed during the hospital encounter of 01/07/24  MRSA Next Gen by PCR, Nasal     Status: None   Collection Time: 01/09/24 11:35 AM   Specimen: Nasal Mucosa; Nasal Swab  Result Value Ref Range Status   MRSA by PCR Next Gen NOT DETECTED NOT DETECTED Final    Comment: (NOTE) The GeneXpert MRSA Assay (FDA approved for NASAL specimens only), is one component of a comprehensive MRSA colonization surveillance program. It is not intended to diagnose MRSA infection nor to guide or monitor treatment for MRSA infections. Test performance is not FDA approved in patients less than 89 years old. Performed at [facility], [address] [place],  [place] [address]   Aerobic/Anaerobic Culture w Gram Stain (surgical/deep wound)     Status: None   Collection Time: 01/09/24  4:34 PM   Specimen: Intervertebral Disc; Tissue  Result Value Ref Range Status   Specimen Description WOUND  Final   Special Requests INTERVERTERAL DISC 9,10 PT ON ANCEF   Final   Gram Stain NO WBC SEEN NO ORGANISMS SEEN   Final   Culture   Final    No growth aerobically or anaerobically. Performed at [facility], [address] [place], [place] [address]    Report Status 01/14/2024 FINAL  Final  Aerobic/Anaerobic Culture w Gram Stain (surgical/deep wound)     Status: None   Collection Time: 01/09/24  4:44 PM   Specimen: Intervertebral Disc; Tissue  Result Value Ref Range Status   Specimen Description WOUND  Final   Special Requests INTERVERTEBRAL DISC 9,10 SWAB PT ON ANCEF   Final   Gram Stain NO WBC SEEN NO ORGANISMS SEEN   Final   Culture   Final    RARE CUTIBACTERIUM ACNES Standardized susceptibility testing for this organism is not available. Performed at [facility], [address], [place], [place] [address]    Report Status 01/15/2024 FINAL  Final   Imaging CT T spine 01/08/24 1. Severe thecal sac attenuation at the T9-10 level by soft tissue density material, likely epidural phlegmon/abscess. 2. 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 3. Unchanged endplate erosion at T9-10 and T11-12  Assessment/Plan # Thoracic epidural abscess complicated with hardware  - ciprofloxacin  and ceftriaxone  through 5/14> PO doxycycline  and ciprofloxacin  thereafter  Plan  - continue doxycycline  and ciprofloxacin  as is. She is hesitant to stop antibiotics anytime soon as she cannot risk another recurrence of infection - Discussed side effects of antibiotics including aortic aneurysm/dissection, arthropathy/arthralgia, CNS effects, C diff infection, glucose dysregulation, peripheral  neuropathy, tendinopathy/tendon rupture, qtc prolongation, exacerbation of Myaesthenia gravis etc with ciprofloxacin  - Fu in 3 months  - Fu with Nsx as planned   #  RT shoulder pain/Possible avascular necrosis - Followed by Orthopedics Dr Melita. 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, conservative management recommended, next fu in 6 months  - Discussed signs and symptoms that could suggest superimposed infection   # Medication management  - Labs today - EKG done at the office 5/13 Qtc 463  # Morbid Obesity  - will benefit from measures to loose weight   # Chronic LBP with radiculopathy/Lower extremity weakness - followed by pain medicine, last seen 03/05/24  # Liver cirrhosis - 03/14/23 acute hep panel negative, needs HCC screening with US  liver every 6 months  - Follows [facility], last seen 5/30  I spent 30 minutes involved in face-to-face and non-face-to-face activities for this patient on the day of the visit. Professional time spent includes the following activities: Preparing to see the patient (review of tests), reviewed last notes from Orthopedics, Performing a medically appropriate examination and evaluation , Ordering labs, Documenting clinical information in the EMR, Independently interpreting results (not separately reported), Communicating results to the patient/husband, Counseling and educating the patient/husband and Care coordination (not separately reported).   Of note, portions of this note may have been created with voice recognition software. While this note has been edited for accuracy, occasional wrong-word or 'sound-a-like' substitutions may have occurred due to the inherent limitations of voice recognition software.   Annalee Joseph, MD [facility] 04/10/2024, 8:33 AM

## 2024-04-11 LAB — CBC
HCT: 32.4 % — ABNORMAL LOW (ref 35.0–45.0)
Hemoglobin: 10 g/dL — ABNORMAL LOW (ref 11.7–15.5)
MCH: 27 pg (ref 27.0–33.0)
MCHC: 30.9 g/dL — ABNORMAL LOW (ref 32.0–36.0)
MCV: 87.3 fL (ref 80.0–100.0)
MPV: 9.6 fL (ref 7.5–12.5)
Platelets: 139 Thousand/uL — ABNORMAL LOW (ref 140–400)
RBC: 3.71 Million/uL — ABNORMAL LOW (ref 3.80–5.10)
RDW: 16.5 % — ABNORMAL HIGH (ref 11.0–15.0)
WBC: 5.6 Thousand/uL (ref 3.8–10.8)

## 2024-04-11 LAB — COMPREHENSIVE METABOLIC PANEL WITH GFR
AG Ratio: 1.1 (calc) (ref 1.0–2.5)
ALT: 28 U/L (ref 6–29)
AST: 33 U/L (ref 10–35)
Albumin: 3.3 g/dL — ABNORMAL LOW (ref 3.6–5.1)
Alkaline phosphatase (APISO): 286 U/L — ABNORMAL HIGH (ref 37–153)
BUN/Creatinine Ratio: 22 (calc) (ref 6–22)
BUN: 24 mg/dL (ref 7–25)
CO2: 33 mmol/L — ABNORMAL HIGH (ref 20–32)
Calcium: 9.1 mg/dL (ref 8.6–10.4)
Chloride: 103 mmol/L (ref 98–110)
Creat: 1.08 mg/dL — ABNORMAL HIGH (ref 0.50–1.05)
Globulin: 2.9 g/dL (ref 1.9–3.7)
Glucose, Bld: 186 mg/dL — ABNORMAL HIGH (ref 65–99)
Potassium: 4.3 mmol/L (ref 3.5–5.3)
Sodium: 142 mmol/L (ref 135–146)
Total Bilirubin: 0.4 mg/dL (ref 0.2–1.2)
Total Protein: 6.2 g/dL (ref 6.1–8.1)
eGFR: 56 mL/min/1.73m2 — ABNORMAL LOW (ref >=60)

## 2024-04-11 LAB — SEDIMENTATION RATE: Sed Rate: 63 mm/h — ABNORMAL HIGH (ref 0–30)

## 2024-04-11 LAB — C-REACTIVE PROTEIN: CRP: 8.1 mg/L — ABNORMAL HIGH (ref <8.0)

## 2024-04-14 ENCOUNTER — Ambulatory Visit: Payer: Self-pay | Admitting: Internal Medicine

## 2024-04-19 ENCOUNTER — Other Ambulatory Visit: Payer: Self-pay | Admitting: Cardiology

## 2024-05-05 ENCOUNTER — Other Ambulatory Visit: Payer: Self-pay

## 2024-05-06 MED ORDER — DULOXETINE HCL 60 MG PO CPEP: ORAL_CAPSULE | ORAL | 0 refills | Status: DC

## 2024-05-07 ENCOUNTER — Encounter: Payer: Self-pay | Admitting: Physical Medicine and Rehabilitation

## 2024-05-07 ENCOUNTER — Encounter: Attending: Physical Medicine and Rehabilitation | Admitting: Physical Medicine and Rehabilitation

## 2024-05-07 VITALS — BP 106/69 | HR 84 | Ht 61.0 in

## 2024-05-07 DIAGNOSIS — N319 Neuromuscular dysfunction of bladder, unspecified: Secondary | ICD-10-CM | POA: Diagnosis present

## 2024-05-07 DIAGNOSIS — M4714 Other spondylosis with myelopathy, thoracic region: Secondary | ICD-10-CM | POA: Insufficient documentation

## 2024-05-07 DIAGNOSIS — Z789 Other specified health status: Secondary | ICD-10-CM | POA: Insufficient documentation

## 2024-05-07 DIAGNOSIS — G062 Extradural and subdural abscess, unspecified: Secondary | ICD-10-CM | POA: Insufficient documentation

## 2024-05-07 DIAGNOSIS — Z7409 Other reduced mobility: Secondary | ICD-10-CM | POA: Insufficient documentation

## 2024-05-07 DIAGNOSIS — M25511 Pain in right shoulder: Secondary | ICD-10-CM | POA: Insufficient documentation

## 2024-05-07 MED ORDER — DULOXETINE HCL 60 MG PO CPEP: 60.0000 mg | ORAL_CAPSULE | Freq: Two times a day (BID) | ORAL | 5 refills | Status: DC

## 2024-05-07 MED ORDER — MIRABEGRON ER 25 MG PO TB24: ORAL_TABLET | ORAL | 2 refills | Status: DC

## 2024-05-07 NOTE — Patient Instructions (Signed)
 Call your Urologist to make a follow up; I am refilling your Mysbetriq 25 mg for 2 weeks, then increase to 50 mg after 2 weeks if you are still having urge incontinence.  I am refilling your Cymbalta  60 mg BID  Call your pain physician to inform them of the pain in your shoulder and adjust medications  Call infectious disease and let them know about your chills; they may want to repeat labs earlier than scheduled.   I will reach out to [name] to discuss qualifications for shoulder surgery, and ask about peripheral nerve stimulator for the short-term period before you get surgery. I will also ask about aquatherapy for your legs vs. Saving PT days for post-op.   I agree with returning your manual wheelchair if you are not using it; continue with your current power wheelchair and save your final assistive device until after surgery.  Follow up with me in 3 months; can be telehealth

## 2024-05-07 NOTE — Progress Notes (Signed)
 Subjective:    Patient ID: Katelyn [name], female    DOB: 04-Aug-1956, 68 y.o.   MRN: [id number]  HPI   ESTEPHANIE [name] is a 68 y.o. year old female  who  has a past medical history of Anxiety, Arthritis, Bursitis of right hip, CAD (coronary artery disease), Chest pain, neg MI, stable CAD non obstructive on cath 10/05/20 (10/04/2020), Chronic diastolic heart failure (HCC) (88/87/7981), Chronic kidney disease, stage 3 (HCC), Chronic low back pain without sciatica (03/14/2016), Cirrhosis of liver (HCC), CKD (chronic kidney disease), stage III (HCC) (10/07/2020), Complication of anesthesia, Cough (04/19/2017), Depression, Dyspnea, Elevated liver enzymes (12/05/2016), Essential hypertension, GERD (gastroesophageal reflux disease), Gout (03/14/2016), Greater trochanteric bursitis of right hip (02/02/2012), H/O hiatal hernia, Heart murmur, History of blood transfusion (2016), History of esophageal stricture (10/07/2020), History of kidney stones, Hypercholesterolemia, Hypertensive heart disease with heart failure (HCC) (01/01/2017), Hypoxia (10/07/2020), Iliotibial band syndrome of right side (02/02/2012), Iron  deficiency anemia due to chronic blood loss (03/14/2016), LBBB (left bundle branch block) (01/01/2017), Left bundle branch block, Leg weakness (10/07/2020), Lumbar stenosis, Meralgia paraesthetica (12/05/2016), Mild CAD (11/24/2015), Morbid obesity (HCC) (10/07/2020), Neuropathy, OSA (obstructive sleep apnea) (05/24/2016), PONV (postoperative nausea and vomiting), Restless leg syndrome, S/P lumbar laminectomy (11/26/2015), S/P lumbar spinal fusion (08/29/2016), Tinnitus (12/05/2016), Type 2 diabetes mellitus (HCC), and UTI (urinary tract infection) (10/07/2020).  They are presenting to PM&R clinic for follow up related to T10 spinal stenosis with myelopathy s/p T7-L2 fusion and replacement of T10, L1, and T12 screws with IPR stay 4/4-4/25. SABRA    Plan from last visit: Thoracic spondylosis with  myelopathy Neuropathic pain   I am increasing your duloxetine , continue 60 mg at night and start 30 mg in the morning.  After 4 weeks, if you are noting benefit from this medication in terms of your pain control, mood, and/or sleep, stop the 30 mg tablets and take the 60 mg tablets twice daily.   Continue Lyrica  and oxycodone  at current doses through your pain management office.  Stop hydrocodone /Norco.     Follow-up with me in 3 months.  If you need anything between now and then, feel free to call the clinic or message me through MyChart.   Epidural abscess   Follow-up with neurosurgery and infectious disease.   Neurogenic bladder Urge incontinence, chronic   In addition, because of worsening urinary urgency and suspicion of neurogenic bladder, call your urology office and make an appointment in the next few weeks.   I am increasing your Myrbetriq  to 2 tablets or 50 mg once daily.  If any side effects to this medication such as inability to void for more than 8-12 hours, stop the medication and go to the ER.  You do not have any symptoms concerning for urinary tract infection at this time.   Interval Hx:  - Therapies: She is on restrictions to use R arm as little as possible for now given advanced arthritis. She is still doing PT, mostly just massage and seated leg exercises. She still has no strength in her legs because she needs her shoulders to stand up, so she is essentially wheelchair bound. She can stand for short periods and march in place with her walker. She can do transfers and short pivots for ADLs.    - Follow ups: [name] 03/20/24 Very pleasant 68 year old comes in today 2 months postop for extension of her thoracolumbar fusion. She is doing pretty well. She has been doing in-home physical therapy. She feels unsteady on  her feet still. Does not have much back pain. She does complain of right shoulder pain. She is very tender upon palpation over her right shoulder. Has pain  with passive range of motion   Was sent to [facility] [name] for her R shoulder pain; CT scan as below CT R shoulder 01/17/24: Superior migration of the humeral head with reduction of the acromial humeral interval. Subcortical cystic changes and irregularity of the greater tuberosity. Glenohumeral joint effusion with a tiny loose bodies in the anterior joint space. Suspected fluid in the subacromial/subdeltoid bursa. Mild degenerative changes of the glenohumeral joint with asymmetric joint space narrowing and small marginal osteophytes. There is anterior decentering of the humeral head relative to the glenoid.   The acromioclavicular joint is anatomically aligned with mild-to-moderate degenerative changes. Partially visualized fusion hardware at the level of the mid to lower thoracic spine. Partially visualized spinal stimulator lead terminates at the T6 level.  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    - Falls:Fell in early May  - DME:She has a power wheelchair OOP; she can use it in the home, much less cumbersome than the manual WC.   She can't use her manual wheelchair and [employer] is looking into taking it back and getting her a new one.    - Medications:   [name] in [place] pain management is still prescribing oxycodone ; next appt August 18th. Shoulder is new since her last visit there. She is taking an oxycodone  every night now.   Continuing PO doxy and cipro  indefinitely per ID; unsure if R shoulder infection, did not aspirate shoulder because would not rule out - low likelihood of infection.   She ran out of myrbetriq  - worked well, now out of it. When she gets up she is having urge incontinence. Her prior urologist left the practice so she has not been back to see them. [name] is still there but she has not made an appointment.   Duloxetine  is doing better with increased dose; she is depressed about current situation. She  denies any radicular pains in the legs.    - Other concerns: Has been getting chills recently. No documented fevers. Has been getting bloodwork with ID but none recently.    Pain Inventory Average Pain 5 Pain Right Now 8 My pain is constant, sharp, stabbing, and aching  In the last 24 hours, has pain interfered with the following? General activity 2 Relation with others 4 Enjoyment of life 5 What TIME of day is your pain at its worst? daytime and night Sleep (in general) Fair  Pain is worse with: walking, sitting, standing, and some activites Pain improves with: heat/ice, therapy/exercise, and medication Relief from Meds: 7  Family History  Problem Relation Age of Onset   Lung cancer Father        smoked   Hypertension Father    Stroke Father    Heart failure Mother    Bone cancer Sister    Asthma Sister    Social History   Socioeconomic History   Marital status: Married    Spouse name: Not on file   Number of children: 0   Years of education: Not on file   Highest education level: Not on file  Occupational History   Occupation: Librarian, academic  Tobacco Use   Smoking status: Never   Smokeless tobacco: Never   Tobacco comments:    Prior secondhand smoke  Vaping Use  Vaping status: Never Used  Substance and Sexual Activity   Alcohol use: Not Currently   Drug use: No   Sexual activity: Not on file  Other Topics Concern   Not on file  Social History Narrative   1 son passed in 58   Social Drivers of Health   Financial Resource Strain: Not on file  Food Insecurity: No Food Insecurity (01/09/2024)   Hunger Vital Sign    Worried About Running Out of Food in the Last Year: Never true    Ran Out of Food in the Last Year: Never true  Transportation Needs: No Transportation Needs (01/08/2024)   PRAPARE - Administrator, Civil Service (Medical): No    Lack of Transportation (Non-Medical): No  Physical Activity: Not on file  Stress: Not on  file  Social Connections: Socially Integrated (01/10/2024)   Social Connection and Isolation Panel    Frequency of Communication with Friends and Family: More than three times a week    Frequency of Social Gatherings with Friends and Family: More than three times a week    Attends Religious Services: More than 4 times per year    Active Member of Clubs or Organizations: Yes    Attends Engineer, structural: More than 4 times per year    Marital Status: Married   Past Surgical History:  Procedure Laterality Date   ABDOMINAL HYSTERECTOMY  1983   APPENDECTOMY  Age 64   BACK SURGERY     FIRST LUMBAR FUSION/ SURGERY APRIL 2012 AND FUSION WITH INSTRUMENTATION SEPT 2012   CARDIAC CATHETERIZATION     x 2   CARPAL TUNNEL RELEASE     bil   CHOLECYSTECTOMY  1990's   COLONOSCOPY  12/12/2017   Colonic polyp status post polypectomy. Mild sigmoid diverticulosis. Otherwise normal colonoscopy to terminal ileum.   CYSTO EXTRACTION KIDNEY STONES     ESOPHAGOGASTRODUODENOSCOPY  12/12/2017   Small hiatal hernia. Mild gastritis. Status post esophageal dilatation.   EXCISION/RELEASE BURSA HIP  02/02/2012   Procedure: EXCISION/RELEASE BURSA HIP;  Surgeon: Tanda DELENA Heading, MD;  Location: [facility];  Service: Orthopedics;  Laterality: Right;  Right Hip Bursectomy   EYE SURGERY Bilateral    cataracts   IR THORACIC DISC ASPIRATION W/IMG GUIDE  02/14/2023   KIDNEY STONE SURGERY  2008   LAMINECTOMY WITH POSTERIOR LATERAL ARTHRODESIS LEVEL 3 N/A 01/09/2024   Procedure: Posterior lateral fusion - Thoracic Seven-Thoracic Eight - Thoracic Eight-Thoracic Nine - Thoracic Nine-Thoracic Ten, Thoracic Ten-Eleven, Thoracic Eleven-Twelve, Thoracic Twelve -Lumbar One, extension of fusion and removal of Thoracic Ten screws;  Surgeon: Joshua Alm Hamilton, MD;  Location: [facility] OR;  Service: Neurosurgery;  Laterality: N/A;  Posterior lateral fusion - Thoracic Seven-Thora   LAMINECTOMY WITH POSTERIOR LATERAL ARTHRODESIS LEVEL 4 N/A  02/19/2023   Procedure: THORACIC TEN-LUMBAR TWO INSTRUMENTED FUSION;  Surgeon: Joshua Alm RAMAN, MD;  Location: [facility] OR;  Service: Neurosurgery;  Laterality: N/A;   Left knee surgery x 2  1996   reconstruction   LUMBAR DISC SURGERY  08/2016   LUMBAR LAMINECTOMY/DECOMPRESSION MICRODISCECTOMY Right 11/26/2015   Procedure: Extraforaminal Microdiscectomy  - Lumbar two-three- right;  Surgeon: Alm RAMAN Joshua, MD;  Location: [facility];  Service: Neurosurgery;  Laterality: Right;  right    LUMBAR WOUND DEBRIDEMENT N/A 10/25/2016   Procedure: Lumbar wound revision;  Surgeon: Alm RAMAN Joshua, MD;  Location: [facility] OR;  Service: Neurosurgery;  Laterality: N/A;  Lumbar wound revision   Right shoulder surgery  2010  spur   RIGHT/LEFT HEART CATH AND CORONARY ANGIOGRAPHY N/A 10/05/2020   Procedure: RIGHT/LEFT HEART CATH AND CORONARY ANGIOGRAPHY;  Surgeon: Swaziland, Peter M, MD;  Location: [facility] INVASIVE CV LAB;  Service: Cardiovascular;  Laterality: N/A;   SPINAL CORD STIMULATOR REMOVAL  02/19/2023   Procedure: LUMBAR SPINAL CORD STIMULATOR REMOVAL;  Surgeon: Joshua Alm RAMAN, MD;  Location: [facility] OR;  Service: Neurosurgery;;   Past Surgical History:  Procedure Laterality Date   ABDOMINAL HYSTERECTOMY  1983   APPENDECTOMY  Age 69   BACK SURGERY     FIRST LUMBAR FUSION/ SURGERY APRIL 2012 AND FUSION WITH INSTRUMENTATION SEPT 2012   CARDIAC CATHETERIZATION     x 2   CARPAL TUNNEL RELEASE     bil   CHOLECYSTECTOMY  1990's   COLONOSCOPY  12/12/2017   Colonic polyp status post polypectomy. Mild sigmoid diverticulosis. Otherwise normal colonoscopy to terminal ileum.   CYSTO EXTRACTION KIDNEY STONES     ESOPHAGOGASTRODUODENOSCOPY  12/12/2017   Small hiatal hernia. Mild gastritis. Status post esophageal dilatation.   EXCISION/RELEASE BURSA HIP  02/02/2012   Procedure: EXCISION/RELEASE BURSA HIP;  Surgeon: Tanda DELENA Heading, MD;  Location: [facility];  Service: Orthopedics;  Laterality: Right;  Right Hip Bursectomy   EYE SURGERY  Bilateral    cataracts   IR THORACIC DISC ASPIRATION W/IMG GUIDE  02/14/2023   KIDNEY STONE SURGERY  2008   LAMINECTOMY WITH POSTERIOR LATERAL ARTHRODESIS LEVEL 3 N/A 01/09/2024   Procedure: Posterior lateral fusion - Thoracic Seven-Thoracic Eight - Thoracic Eight-Thoracic Nine - Thoracic Nine-Thoracic Ten, Thoracic Ten-Eleven, Thoracic Eleven-Twelve, Thoracic Twelve -Lumbar One, extension of fusion and removal of Thoracic Ten screws;  Surgeon: Joshua Alm Hamilton, MD;  Location: [facility] OR;  Service: Neurosurgery;  Laterality: N/A;  Posterior lateral fusion - Thoracic Seven-Thora   LAMINECTOMY WITH POSTERIOR LATERAL ARTHRODESIS LEVEL 4 N/A 02/19/2023   Procedure: THORACIC TEN-LUMBAR TWO INSTRUMENTED FUSION;  Surgeon: Joshua Alm RAMAN, MD;  Location: [facility] OR;  Service: Neurosurgery;  Laterality: N/A;   Left knee surgery x 2  1996   reconstruction   LUMBAR DISC SURGERY  08/2016   LUMBAR LAMINECTOMY/DECOMPRESSION MICRODISCECTOMY Right 11/26/2015   Procedure: Extraforaminal Microdiscectomy  - Lumbar two-three- right;  Surgeon: Alm RAMAN Joshua, MD;  Location: [facility];  Service: Neurosurgery;  Laterality: Right;  right    LUMBAR WOUND DEBRIDEMENT N/A 10/25/2016   Procedure: Lumbar wound revision;  Surgeon: Alm RAMAN Joshua, MD;  Location: [facility] OR;  Service: Neurosurgery;  Laterality: N/A;  Lumbar wound revision   Right shoulder surgery  2010   spur   RIGHT/LEFT HEART CATH AND CORONARY ANGIOGRAPHY N/A 10/05/2020   Procedure: RIGHT/LEFT HEART CATH AND CORONARY ANGIOGRAPHY;  Surgeon: Swaziland, Peter M, MD;  Location: [facility] INVASIVE CV LAB;  Service: Cardiovascular;  Laterality: N/A;   SPINAL CORD STIMULATOR REMOVAL  02/19/2023   Procedure: LUMBAR SPINAL CORD STIMULATOR REMOVAL;  Surgeon: Joshua Alm RAMAN, MD;  Location: [facility] OR;  Service: Neurosurgery;;   Past Medical History:  Diagnosis Date   Anxiety    Arthritis    Bursitis of right hip    CAD (coronary artery disease)    Cardiac catheterization June 2014 in [place]  - 50% circumflex stenosis   Chest pain, neg MI, stable CAD non obstructive on cath 10/05/20 10/04/2020   Chronic diastolic heart failure (HCC) 08/20/2017   Chronic kidney disease, stage 3 (HCC)    does not see nephrologist   Chronic low back pain without sciatica 03/14/2016   Cirrhosis  of liver (HCC)    CKD (chronic kidney disease), stage III (HCC) 10/07/2020   Complication of anesthesia    Cough 04/19/2017   Overview:  Last Assessment & Plan:  Formatting of this note may be different from the original. Cough - ? ACE related with AR triggers   Plan  Patient Instructions  Discuss with your primary doctor that lisinopril  pain, need making your cough worse. May use Mucinex  DM twice daily as needed for cough and congestion Zyrtec 10 mg at bedtime as needed for drainage Saline nasal spray as needed. Lab tests today Activity as tolerated. Follow with Dr. Shellia in 3-4 months and As needed   Please contact office for sooner follow up if symptoms do not improve or worsen or seek emergency care    Depression    Dyspnea    with exertion    lazy lung - per  Dr Shellia from back issues- 06/2016   Elevated liver enzymes 12/05/2016   Essential hypertension    GERD (gastroesophageal reflux disease)    Gout 03/14/2016   Greater trochanteric bursitis of right hip 02/02/2012   H/O hiatal hernia    Heart murmur    History of blood transfusion 2016   History of esophageal stricture 10/07/2020   History of kidney stones    Hypercholesterolemia    Hypertensive heart disease with heart failure (HCC) 01/01/2017   Hypoxia 10/07/2020   Iliotibial band syndrome of right side 02/02/2012   Iron  deficiency anemia due to chronic blood loss 03/14/2016   LBBB (left bundle branch block) 01/01/2017   Left bundle branch block    Leg weakness 10/07/2020   Lumbar stenosis    Meralgia paraesthetica 12/05/2016   Mild CAD 11/24/2015   Morbid obesity (HCC) 10/07/2020   Neuropathy    OSA (obstructive sleep apnea) 05/24/2016    Overview:  Managed PULM- no CPAP   PONV (postoperative nausea and vomiting)    no N/V with patch   Restless leg syndrome    S/P lumbar laminectomy 11/26/2015   S/P lumbar spinal fusion 08/29/2016   Tinnitus 12/05/2016   Type 2 diabetes mellitus (HCC)    UTI (urinary tract infection) 10/07/2020   BP 106/69   Pulse 84   Ht 5' 1 (1.549 m)   SpO2 95%   BMI 35.14 kg/m   Opioid Risk Score:   Fall Risk Score:  `1  Depression screen PHQ 2/9     05/07/2024    1:01 PM 02/06/2024    2:39 PM 02/08/2023    1:58 PM 12/26/2022    3:43 PM  Depression screen PHQ 2/9  Decreased Interest 1 0 0 0  Down, Depressed, Hopeless 1 0 0 0  PHQ - 2 Score 2 0 0 0  Altered sleeping  1    Tired, decreased energy  1    Change in appetite  0    Feeling bad or failure about yourself   0    Trouble concentrating  0    Moving slowly or fidgety/restless  0    Suicidal thoughts  0    PHQ-9 Score  2      Review of Systems  Musculoskeletal:  Positive for gait problem.       Pain in right shoulder & right upper arm  All other systems reviewed and are negative.      Objective:   Physical Exam  PE: Constitution: Appropriate appearance for age. No apparent distress +Obese Resp: No respiratory distress. No accessory muscle  usage. on RA Cardio: Well perfused appearance. + Bilateral edema, trace, with compression socks Abdomen: Nondistended. Nontender.   Psych: Appropriate mood and affect. Neuro: AAOx4. No apparent cognitive deficits    Skin: c/d/i   MSK:      R shoulder with obvious edema around biceps; AROM limited to <10 degrees abduction, flexion, and internal/external rotation. No improved PROM.    Sensation: Intact throughout BL UE and LEs No hypertonia. No hyperreflexia or clonus.         Strength:                RUE: 5/5 SA, 5/5 EF, 5/5 EE, 5/5 WE, 5/5 FF, 5/5 FA                LUE:  5/5 SA, 5/5 EF, 5/5 EE, 5/5 WE, 5/5 FF, 5/5 FA                RLE: 4-/5 HF, 5/5 KE, 4/5  DF, 5/5  EHL,  5/5  PF                 LLE:  4+/5 HF, 5/5 KE, 5/5  DF, 5/5  EHL, 5/5  PF    Gait: Power WC dependent.       Assessment & Plan:   GILBERTE GORLEY is a 68 y.o. year old female  who  has a past medical history of Anxiety, Arthritis, Bursitis of right hip, CAD (coronary artery disease), Chest pain, neg MI, stable CAD non obstructive on cath 10/05/20 (10/04/2020), Chronic diastolic heart failure (HCC) (88/87/7981), Chronic kidney disease, stage 3 (HCC), Chronic low back pain without sciatica (03/14/2016), Cirrhosis of liver (HCC), CKD (chronic kidney disease), stage III (HCC) (10/07/2020), Complication of anesthesia, Cough (04/19/2017), Depression, Dyspnea, Elevated liver enzymes (12/05/2016), Essential hypertension, GERD (gastroesophageal reflux disease), Gout (03/14/2016), Greater trochanteric bursitis of right hip (02/02/2012), H/O hiatal hernia, Heart murmur, History of blood transfusion (2016), History of esophageal stricture (10/07/2020), History of kidney stones, Hypercholesterolemia, Hypertensive heart disease with heart failure (HCC) (01/01/2017), Hypoxia (10/07/2020), Iliotibial band syndrome of right side (02/02/2012), Iron  deficiency anemia due to chronic blood loss (03/14/2016), LBBB (left bundle branch block) (01/01/2017), Left bundle branch block, Leg weakness (10/07/2020), Lumbar stenosis, Meralgia paraesthetica (12/05/2016), Mild CAD (11/24/2015), Morbid obesity (HCC) (10/07/2020), Neuropathy, OSA (obstructive sleep apnea) (05/24/2016), PONV (postoperative nausea and vomiting), Restless leg syndrome, S/P lumbar laminectomy (11/26/2015), S/P lumbar spinal fusion (08/29/2016), Tinnitus (12/05/2016), Type 2 diabetes mellitus (HCC), and UTI (urinary tract infection) (10/07/2020).  They are presenting to PM&R clinic for follow up related to T10 spinal stenosis with myelopathy s/p T7-L2 fusion and replacement of T10, L1, and T12 screws with IPR stay 4/4-4/25. .Now pending R shoulder replacement once  infection definitively cleared, resulting in significant disability and WC dependence.   Thoracic spondylosis with myelopathy  I am refilling your Cymbalta  60 mg BID  Follow up with me in 3 months; can be telehealth  Epidural abscess  Call infectious disease and let them know about your chills; they may want to repeat labs earlier than scheduled.   Neurogenic bladder  Call your Urologist to make a follow up; I am refilling your Myrbetriq  25 mg for 2 weeks, then increase to 50 mg after 2 weeks if you are still having urge incontinence.  Acute pain of right shoulder  Call your pain physician to inform them of the pain in your shoulder and adjust medications  I will reach out to [name] to discuss qualifications for  shoulder surgery, and ask about peripheral nerve stimulator for the short-term period before you get surgery. I will also ask about aquatherapy for your legs vs. Saving PT days for post-op.    Impaired mobility and ADLs  I agree with returning your manual wheelchair if you are not using it; continue with your current power wheelchair and save your final assistive device until after surgery.   Other orders -     Mirabegron  ER; Take 1 tablet (25 mg total) by mouth daily for 14 days, THEN 2 tablets (50 mg total) daily for 16 days. If you do not pee for > 6 hours, stop medication and call your physician.  Dispense: 60 tablet; Refill: 2 -     DULoxetine  HCl; Take 1 capsule (60 mg total) by mouth 2 (two) times daily.  Dispense: 60 capsule; Refill: 5

## 2024-05-08 MED ORDER — DULOXETINE HCL 60 MG PO CPEP: 60.0000 mg | ORAL_CAPSULE | Freq: Two times a day (BID) | ORAL | 5 refills | Status: DC

## 2024-05-08 NOTE — Telephone Encounter (Signed)
 Script was sent at her appointment yesterday, but had an old sig, so I removed that and re-sent.

## 2024-05-08 NOTE — Telephone Encounter (Signed)
 Katelyn [name]  reported now taking Duloxetine  60 MG twice daily. Please send refill to [facility]

## 2024-05-08 NOTE — Telephone Encounter (Signed)
 New Rx sent today. Task completed.

## 2024-05-12 NOTE — Telephone Encounter (Signed)
 Patient called requesting labs from most recent appointment. She has not read MyChart message from provider.   Discussed per Dr. Overton that ESR is improving on antibiotics and that CRP is slightly elevated, but stable and that he would like for her to continue with antibiotics and keep follow up appointment.   Patient would like to know if the infection is still present. Discussed that inflammatory markers are non-specific and that provider makes clinical decisions based on combination of labs, imaging, and clinical presentation.   She would like a copy of her labs. Notified her that they are available in MyChart.   Oakes Mccready, BSN, RN

## 2024-05-14 ENCOUNTER — Ambulatory Visit: Admitting: Cardiology

## 2024-05-15 ENCOUNTER — Encounter (HOSPITAL_BASED_OUTPATIENT_CLINIC_OR_DEPARTMENT_OTHER): Payer: Self-pay

## 2024-05-15 ENCOUNTER — Ambulatory Visit (HOSPITAL_BASED_OUTPATIENT_CLINIC_OR_DEPARTMENT_OTHER)
Admission: EM | Admit: 2024-05-15 | Discharge: 2024-05-15 | Disposition: A | Discharge status: Home / Self Care | Attending: Nurse Practitioner | Admitting: Nurse Practitioner

## 2024-05-15 DIAGNOSIS — R233 Spontaneous ecchymoses: Secondary | ICD-10-CM | POA: Diagnosis not present

## 2024-05-15 NOTE — Discharge Instructions (Addendum)
 You were seen today for bruising to the chest area that developed without any known injury. This appears to be spontaneous bruising rather than a rash. While your hemoglobin has improved, your platelet count has shown a slight decrease, which may contribute to easy bruising. A blood test was ordered today to check your current hemoglobin and platelet levels.  To manage symptoms at home, apply an ice pack to the bruised area 2 to 3 times a day for 15 minutes each time. Be sure to place a towel between the ice and your skin to avoid irritation. Avoid using NSAIDs such as ibuprofen or naproxen , as these can increase bleeding risk. Continue to monitor for any new areas of bruising or signs of worsening symptoms.  Follow up with your primary care provider at your scheduled appointment on August 19, or sooner if the bruising spreads, becomes more painful, or new symptoms develop. Seek immediate medical attention if you experience fever, chills, body aches, nausea, vomiting, frequent diarrhea, or any other concerning symptoms.

## 2024-05-15 NOTE — ED Provider Notes (Signed)
 [facility]    CSN: [id number] Arrival date & time: 05/15/24  0936      History   Chief Complaint Chief Complaint  Patient presents with   Rash    HPI Katelyn [name] is a 68 y.o. female.   Discussed the use of AI scribe software for clinical note transcription with the patient, who gave verbal consent to proceed.   Patient with a history of back surgery and avascular necrosis of the right shoulder, presents with concerns about a rash-like appearance on her chest that started 2 days ago.  The patient reports noticing what looked like a rash on her chest, between the breast, when she was getting ready to shower. Initially, there was no discomfort, but pain began last night. The affected area is described as turning yellow and has lightened up, but is now extending around the right nipple. The patient states it feels like somebody is trying to pull my boob off. The patient denies any recent injury or fall that could explain the bruising. She mentions wearing snap-front overalls due to her previous back surgery.    The patient's medical history is significant for a bone infection in her back following surgery 2 years ago, for which she is currently taking doxycycline  and Cipro . She recently saw an Infectious Disease specialist for this issue. 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 She has been told she will eventually need a shoulder replacement, but this cannot be done while the infection is present in her body.  The patient denies any fever, chills, nausea, vomiting, or diarrhea. She reports chronic fluid retention and weeping in her legs, for which she is wearing compression stockings.  The following portions of the patient's history were reviewed and updated as appropriate: allergies, current medications, past family history, past medical history, past social history, past  surgical history, and problem list.     Past Medical History:  Diagnosis Date   Anxiety    Arthritis    Bursitis of right hip    CAD (coronary artery disease)    Cardiac catheterization June 2014 in [place] - 50% circumflex stenosis   Chest pain, neg MI, stable CAD non obstructive on cath 10/05/20 10/04/2020   Chronic diastolic heart failure (HCC) 08/20/2017   Chronic kidney disease, stage 3 (HCC)    does not see nephrologist   Chronic low back pain without sciatica 03/14/2016   Cirrhosis of liver (HCC)    CKD (chronic kidney disease), stage III (HCC) 10/07/2020   Complication of anesthesia    Cough 04/19/2017   Overview:  Last Assessment & Plan:  Formatting of this note may be different from the original. Cough - ? ACE related with AR triggers   Plan  Patient Instructions  Discuss with your primary doctor that lisinopril  pain, need making your cough worse. May use Mucinex  DM twice daily as needed for cough and congestion Zyrtec 10 mg at bedtime as needed for drainage Saline nasal spray as needed. Lab tests today Activity as tolerated. Follow with Dr. Shellia in 3-4 months and As needed   Please contact office for sooner follow up if symptoms do not improve or worsen or seek emergency care    Depression    Dyspnea    with exertion    lazy lung - per  Dr Shellia from back issues- 06/2016   Elevated liver enzymes 12/05/2016  Essential hypertension    GERD (gastroesophageal reflux disease)    Gout 03/14/2016   Greater trochanteric bursitis of right hip 02/02/2012   H/O hiatal hernia    Heart murmur    History of blood transfusion 2016   History of esophageal stricture 10/07/2020   History of kidney stones    Hypercholesterolemia    Hypertensive heart disease with heart failure (HCC) 01/01/2017   Hypoxia 10/07/2020   Iliotibial band syndrome of right side 02/02/2012   Iron  deficiency anemia due to chronic blood loss 03/14/2016   LBBB (left bundle branch block) 01/01/2017   Left  bundle branch block    Leg weakness 10/07/2020   Lumbar stenosis    Meralgia paraesthetica 12/05/2016   Mild CAD 11/24/2015   Morbid obesity (HCC) 10/07/2020   Neuropathy    OSA (obstructive sleep apnea) 05/24/2016   Overview:  Managed PULM- no CPAP   PONV (postoperative nausea and vomiting)    no N/V with patch   Restless leg syndrome    S/P lumbar laminectomy 11/26/2015   S/P lumbar spinal fusion 08/29/2016   Tinnitus 12/05/2016   Type 2 diabetes mellitus (HCC)    UTI (urinary tract infection) 10/07/2020    Patient Active Problem List   Diagnosis Date Noted   Right shoulder pain 04/10/2024   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 02/19/2024   Medication management 02/19/2024   Presence of retained hardware 02/19/2024   Neurogenic bladder 02/10/2024   Neuropathic pain 02/06/2024   Chronic pain syndrome 01/25/2024   Thoracic myelopathy 01/11/2024   Thoracic spondylosis with myelopathy 01/07/2024   Hepatic cirrhosis (HCC) 11/22/2023   ACS (acute coronary syndrome) (HCC) 04/06/2023   Acute sciatica 04/06/2023   Dyslipidemia 04/06/2023   Hypertensive heart and chronic kidney disease 04/06/2023   Hyponatremia 04/06/2023   Hypothermia 04/06/2023   Renal insufficiency 04/06/2023   SIRS (systemic inflammatory response syndrome) (HCC) 04/06/2023   Slurred speech 04/06/2023   Thrombocytopenia (HCC) 04/06/2023   Ventricular tachycardia, nonsustained (HCC) 04/06/2023   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   Acute renal failure (ARF) (HCC) 03/13/2023   Elevated LFTs 03/13/2023   AKI (acute kidney injury) (HCC) 02/20/2023   Status post thoracic spinal fusion 02/19/2023   Discitis 02/13/2023   Malfunction of spinal cord stimulator (HCC) 02/13/2023   Serratia 02/13/2023   Diabetes mellitus without complication (HCC) 02/13/2023   Osteomyelitis (HCC) 02/05/2023   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   Infection of spinal cord stimulator (HCC) 12/07/2022   Lactic acidosis 12/06/2022    Hypomagnesemia 12/06/2022   Anemia of chronic disease 12/06/2022   Diskitis 12/05/2022   Status post insertion of spinal cord stimulator 11/22/2022   Acute thoracic back pain 06/22/2022   Other long term (current) drug therapy 06/22/2022   Lumbar spondylosis 06/22/2022   Thoracic spondylosis 06/22/2022   Bilateral sacroiliitis (HCC) 06/22/2022   Chronic, continuous use of opioids 05/31/2022   Pain medication agreement 05/02/2022   Postlaminectomy syndrome of lumbar region 05/02/2022   Radiculopathy of thoracolumbar region 05/02/2022   Class 2 severe obesity due to excess calories with serious comorbidity and body mass index (BMI) of 37.0 to 37.9 in adult [facility]) 05/02/2022   Impaired mobility and ADLs 05/02/2022   Mastodynia 04/13/2022   Peripheral artery disease (HCC) 07/11/2021   COVID-19 virus infection 06/21/2021   Rib pain on left side 06/06/2021   Abdominal pain 06/05/2021   Diabetes 1.5, 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   GAD (generalized anxiety  disorder) 11/25/2020   Anxiety and depression 11/03/2020   DM2 (diabetes mellitus, type 2) (HCC)    Restless leg syndrome    PONV (postoperative nausea and vomiting)    Neuropathy    Lumbar stenosis    Left bundle branch block    Hypercholesterolemia    Heart murmur    History of kidney stones    H/O hiatal hernia    Essential hypertension    Dyspnea    Chronic depression    Complication of anesthesia    CKD stage 3b, GFR 30-44 ml/min (HCC)    CAD (coronary artery disease)    Bursitis of right hip    Arthritis    Anxiety    Morbid obesity (HCC) 10/07/2020   Hypoxia 10/07/2020   History of esophageal stricture 10/07/2020   CKD (chronic kidney disease), stage III (HCC) 10/07/2020   Leg weakness 10/07/2020   UTI (urinary tract infection) 10/07/2020   Chronic hypoxemic respiratory failure (HCC) 10/07/2020   Pulmonary hypertension (HCC) 10/06/2020   Chest pain, neg MI, stable CAD  non obstructive on cath 10/05/20 10/04/2020   Hardening of the aorta (main artery of the heart) (HCC) 11/25/2019   Hypertensive heart disease with congestive heart failure and chronic kidney disease (HCC) 11/25/2019   Shortness of breath 06/11/2019   Fatty liver 04/08/2019   Migraine 04/08/2019   History of chronic kidney disease 04/08/2019   Acute non-recurrent sinusitis 10/12/2017   Chronic diastolic CHF (congestive heart failure) (HCC) 08/20/2017   Genetic testing 08/17/2017   Family history of ovarian cancer 08/17/2017   Family history of breast cancer 08/17/2017   Cough 04/19/2017   DOE (dyspnea on exertion) 04/19/2017   Type 2 diabetes mellitus with diabetic neuropathy, unspecified (HCC) 01/16/2017   Hypertensive heart disease with heart failure (HCC) 01/01/2017   Hyperlipidemia 01/01/2017   LBBB (left bundle branch block) 01/01/2017   Elevated liver enzymes 12/05/2016   Meralgia paraesthetica 12/05/2016   Tinnitus 12/05/2016   Spinal stenosis of lumbar region with neurogenic claudication 12/05/2016   Wound dehiscence 10/23/2016   S/P lumbar spinal fusion 08/29/2016   OSA (obstructive sleep apnea) 05/24/2016   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   Chronic low back pain without sciatica 03/14/2016   GERD (gastroesophageal reflux disease) 03/14/2016   Gout 03/14/2016   Iron  deficiency anemia due to chronic blood loss 03/14/2016   Type 2 diabetes mellitus without complication, with long-term current use of insulin  (HCC) 03/14/2016   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HCC) 12/07/2015   Restless leg 12/07/2015   Insomnia 12/07/2015   S/P lumbar laminectomy 11/26/2015   Postprocedural state 11/26/2015   Mild CAD 11/24/2015   Lumbar radiculopathy 10/15/2015   History of blood transfusion 2016   Kidney stone 04/20/2014   Stricture of esophagus 04/20/2014   Greater trochanteric bursitis of right hip 02/02/2012   Iliotibial band syndrome of right side 02/02/2012    Iliotibial band syndrome 02/02/2012   Bursitis, trochanteric 02/02/2012    Past Surgical History:  Procedure Laterality Date   ABDOMINAL HYSTERECTOMY  1983   APPENDECTOMY  Age 88   BACK SURGERY     FIRST LUMBAR FUSION/ SURGERY APRIL 2012 AND FUSION WITH INSTRUMENTATION SEPT 2012   CARDIAC CATHETERIZATION     x 2   CARPAL TUNNEL RELEASE     bil   CHOLECYSTECTOMY  1990's   COLONOSCOPY  12/12/2017   Colonic polyp status post polypectomy. Mild sigmoid diverticulosis. Otherwise normal colonoscopy to terminal ileum.   CYSTO EXTRACTION KIDNEY  STONES     ESOPHAGOGASTRODUODENOSCOPY  12/12/2017   Small hiatal hernia. Mild gastritis. Status post esophageal dilatation.   EXCISION/RELEASE BURSA HIP  02/02/2012   Procedure: EXCISION/RELEASE BURSA HIP;  Surgeon: Tanda DELENA Heading, MD;  Location: [facility];  Service: Orthopedics;  Laterality: Right;  Right Hip Bursectomy   EYE SURGERY Bilateral    cataracts   IR THORACIC DISC ASPIRATION W/IMG GUIDE  02/14/2023   KIDNEY STONE SURGERY  2008   LAMINECTOMY WITH POSTERIOR LATERAL ARTHRODESIS LEVEL 3 N/A 01/09/2024   Procedure: Posterior lateral fusion - Thoracic Seven-Thoracic Eight - Thoracic Eight-Thoracic Nine - Thoracic Nine-Thoracic Ten, Thoracic Ten-Eleven, Thoracic Eleven-Twelve, Thoracic Twelve -Lumbar One, extension of fusion and removal of Thoracic Ten screws;  Surgeon: Joshua Alm Hamilton, MD;  Location: [facility] OR;  Service: Neurosurgery;  Laterality: N/A;  Posterior lateral fusion - Thoracic Seven-Thora   LAMINECTOMY WITH POSTERIOR LATERAL ARTHRODESIS LEVEL 4 N/A 02/19/2023   Procedure: THORACIC TEN-LUMBAR TWO INSTRUMENTED FUSION;  Surgeon: Joshua Alm RAMAN, MD;  Location: [facility] OR;  Service: Neurosurgery;  Laterality: N/A;   Left knee surgery x 2  1996   reconstruction   LUMBAR DISC SURGERY  08/2016   LUMBAR LAMINECTOMY/DECOMPRESSION MICRODISCECTOMY Right 11/26/2015   Procedure: Extraforaminal Microdiscectomy  - Lumbar two-three- right;  Surgeon: Alm RAMAN Joshua, MD;  Location: [facility];  Service: Neurosurgery;  Laterality: Right;  right    LUMBAR WOUND DEBRIDEMENT N/A 10/25/2016   Procedure: Lumbar wound revision;  Surgeon: Alm RAMAN Joshua, MD;  Location: [facility] OR;  Service: Neurosurgery;  Laterality: N/A;  Lumbar wound revision   Right shoulder surgery  2010   spur   RIGHT/LEFT HEART CATH AND CORONARY ANGIOGRAPHY N/A 10/05/2020   Procedure: RIGHT/LEFT HEART CATH AND CORONARY ANGIOGRAPHY;  Surgeon: [place], Peter M, MD;  Location: [facility] INVASIVE CV LAB;  Service: Cardiovascular;  Laterality: N/A;   SPINAL CORD STIMULATOR REMOVAL  02/19/2023   Procedure: LUMBAR SPINAL CORD STIMULATOR REMOVAL;  Surgeon: Joshua Alm RAMAN, MD;  Location: [facility] OR;  Service: Neurosurgery;;    OB History   No obstetric history on file.      Home Medications    Prior to Admission medications   Medication Sig Start Date End Date Taking? Authorizing Provider  carvedilol  (COREG ) 6.25 MG tablet Take 6.25 mg by mouth 2 (two) times daily with a meal.    [provider]  ciprofloxacin  (CIPRO ) 500 MG tablet Take 1 tablet (500 mg total) by mouth 2 (two) times daily. 03/17/24   Manandhar, Sabina, MD  diclofenac  Sodium (VOLTAREN ) 1 % GEL Apply 2 g topically 4 (four) times daily. 01/31/24   Love, Sharlet RAMAN, PA-C  doxycycline  (VIBRA -TABS) 100 MG tablet Take 1 tablet (100 mg total) by mouth 2 (two) times daily. 02/21/24   Manandhar, Sabina, MD  DULoxetine  (CYMBALTA ) 60 MG capsule Take 1 capsule (60 mg total) by mouth 2 (two) times daily. 05/08/24   Emeline Search C, DO  melatonin 5 MG TABS Take 1 tablet (5 mg total) by mouth at bedtime as needed. 01/31/24   Love, Sharlet RAMAN, PA-C  mirabegron  ER (MYRBETRIQ ) 25 MG TB24 tablet Take 1 tablet (25 mg total) by mouth daily for 14 days, THEN 2 tablets (50 mg total) daily for 16 days. If you do not pee for > 6 hours, stop medication and call your physician. 05/07/24 06/06/24  Emeline Search C, DO  mirtazapine  (REMERON ) 30 MG tablet Take 30 mg by  mouth at bedtime.    [provider]  Multiple Vitamin (MULTIVITAMIN WITH MINERALS) TABS tablet Take 1 tablet by mouth daily.    [provider]  Multiple Vitamins-Minerals (HAIR SKIN AND NAILS FORMULA) TABS Take 1 tablet by mouth daily. 10/17/18   [provider]  NOVOLOG  RELION 100 UNIT/ML injection Inject 2-20 Units into the skin 3 (three) times daily with meals. 02/01/24   Love, Sharlet RAMAN, PA-C  oxyCODONE  (OXY IR/ROXICODONE ) 5 MG immediate release tablet Take 5 mg by mouth. 03/05/24 06/03/24  [provider]  pantoprazole  (PROTONIX ) 40 MG tablet Take 1 tablet by mouth once daily 04/01/24   Monetta Redell PARAS, MD  polyethylene glycol powder (GLYCOLAX /MIRALAX ) 17 GM/SCOOP powder Take 17 g by mouth daily. 01/31/24   Love, Sharlet RAMAN, PA-C  pramipexole  (MIRAPEX ) 1 MG tablet Take 1 mg by mouth at bedtime.    [provider]  pregabalin  (LYRICA ) 75 MG capsule Take 1 capsule (75 mg total) by mouth 3 (three) times daily. 01/31/24   Love, Sharlet RAMAN, PA-C  senna-docusate (SENOKOT-S) 8.6-50 MG tablet Take 2 tablets by mouth 2 (two) times daily. 01/31/24   Love, Sharlet RAMAN, PA-C  tirzepatide [facility]) 5 MG/0.5ML Pen Inject 7 mg into the skin once a week. 06/06/23   [provider]  torsemide  (DEMADEX ) 20 MG tablet Take 1 tablet by mouth twice daily 04/21/24   Carlin Delon BROCKS, NP  TRESIBA  FLEXTOUCH 200 UNIT/ML FlexTouch Pen Inject 40 Units into the skin daily with breakfast. 02/01/24   Love, Sharlet RAMAN, PA-C    Family History Family History  Problem Relation Age of Onset   Lung cancer Father        smoked   Hypertension Father    Stroke Father    Heart failure Mother    Bone cancer Sister    Asthma Sister     Social History Social History   Tobacco Use   Smoking status: Never   Smokeless tobacco: Never   Tobacco comments:    Prior secondhand smoke  Vaping Use   Vaping status: Never Used  Substance Use Topics   Alcohol use: Not Currently   Drug use: No      Allergies   Atorvastatin calcium , Oxycodone , Atorvastatin, Pentazocine, Cefepime , Gabapentin , and Other   Review of Systems Review of Systems  Constitutional:  Negative for chills and fever.  Respiratory:  Negative for shortness of breath.   Cardiovascular:  Positive for leg swelling. Negative for chest pain and palpitations.  Gastrointestinal:  Negative for diarrhea, nausea and vomiting.  Skin:  Positive for rash.  All other systems reviewed and are negative.    Physical Exam Triage Vital Signs ED Triage Vitals  Encounter Vitals Group     BP 05/15/24 0954 (!) 141/75     Girls Systolic BP Percentile --      Girls Diastolic BP Percentile --      Boys Systolic BP Percentile --      Boys Diastolic BP Percentile --      Pulse Rate 05/15/24 0954 84     Resp 05/15/24 0954 20     Temp 05/15/24 0954 97.8 F (36.6 C)     Temp Source 05/15/24 0954 Oral     SpO2 05/15/24 0954 97 %     Weight --      Height --      Head Circumference --      Peak Flow --      Pain Score 05/15/24 0951 6     Pain Loc --  Pain Education --      Exclude from Growth Chart --    No data found.  Updated Vital Signs BP (!) 141/75 (BP Location: Left Arm)   Pulse 84   Temp 97.8 F (36.6 C) (Oral)   Resp 20   SpO2 97%   Visual Acuity Right Eye Distance:   Left Eye Distance:   Bilateral Distance:    Right Eye Near:   Left Eye Near:    Bilateral Near:     Physical Exam Vitals reviewed.  Constitutional:      General: She is awake. She is not in acute distress.    Appearance: Normal appearance. She is well-developed. She is not ill-appearing, toxic-appearing or diaphoretic.  HENT:     Head: Normocephalic.     Right Ear: Hearing normal.     Left Ear: Hearing normal.     Nose: Nose normal.     Mouth/Throat:     Mouth: Mucous membranes are moist.  Eyes:     General: Vision grossly intact.     Conjunctiva/sclera: Conjunctivae normal.  Cardiovascular:     Rate and Rhythm: Normal  rate and regular rhythm.     Heart sounds: Normal heart sounds.  Pulmonary:     Effort: Pulmonary effort is normal.     Breath sounds: Normal breath sounds and air entry.  Chest:     Chest wall: No swelling.  Breasts:    Right: Skin change present. No swelling, bleeding, inverted nipple, mass, nipple discharge or tenderness.     Left: Skin change present. No swelling, bleeding, inverted nipple, mass, nipple discharge or tenderness.     Comments: See picture below  Musculoskeletal:        General: Normal range of motion.     Cervical back: Full passive range of motion without pain, normal range of motion and neck supple.     Right lower leg: Edema present.     Left lower leg: Edema present.  Skin:    General: Skin is warm and dry.     Findings: Bruising present. No rash.  Neurological:     General: No focal deficit present.     Mental Status: She is alert and oriented to person, place, and time.  Psychiatric:        Speech: Speech normal.        Behavior: Behavior is cooperative.      UC Treatments / Results  Labs (all labs ordered are listed, but only abnormal results are displayed) Labs Reviewed  CBC WITH DIFFERENTIAL/PLATELET    EKG   Radiology No results found.  Procedures Procedures (including critical care time)  Medications Ordered in UC Medications - No data to display  Initial Impression / Assessment and Plan / UC Course  I have reviewed the triage vital signs and the nursing notes.  Pertinent labs & imaging results that were available during my care of the patient were reviewed by me and considered in my medical decision making (see chart for details).     Patient presents with a 2-day history of bruising to the mid-breast area, initially mistaken for a rash but now progressing to involve the right nipple. She describes localized pain and a pulling sensation. There is no reported history of trauma, and the smooth texture of the area supports the  assessment of spontaneous bruising rather than a dermatologic condition. The patient's history of anemia appears to be improving, with hemoglobin increasing from 8.6 in April to 10.0 in July.  However, her platelet count has decreased from 169 in April to 139 in July, raising concern for a potential clotting issue contributing to spontaneous bruising. She denies any use of aspirin  or NSAIDs. A CBC was ordered today to reassess current hemoglobin and platelet levels. The patient was advised to avoid NSAIDs due to bleeding risk and to apply ice to the area 2-3 times daily for 15 minutes with a barrier to protect the skin. She was instructed to monitor for any new or worsening bruising and to follow up with her primary care provider at the scheduled visit on August 19 or sooner if symptoms progress. Immediate medical attention is advised for the development of fever, chills, body aches, nausea, vomiting, or persistent diarrhea.  Today's evaluation has revealed no signs of a dangerous process. Discussed diagnosis with patient and/or guardian. Patient and/or guardian aware of their diagnosis, possible red flag symptoms to watch out for and need for close follow up. Patient and/or guardian understands verbal and written discharge instructions. Patient and/or guardian comfortable with plan and disposition.  Patient and/or guardian has a clear mental status at this time, good insight into illness (after discussion and teaching) and has clear judgment to make decisions regarding their care  Documentation was completed with the aid of voice recognition software. Transcription may contain typographical errors.   Final Clinical Impressions(s) / UC Diagnoses   Final diagnoses:  Bruising, spontaneous     Discharge Instructions      You were seen today for bruising to the chest area that developed without any known injury. This appears to be spontaneous bruising rather than a rash. While your hemoglobin has improved,  your platelet count has shown a slight decrease, which may contribute to easy bruising. A blood test was ordered today to check your current hemoglobin and platelet levels.  To manage symptoms at home, apply an ice pack to the bruised area 2 to 3 times a day for 15 minutes each time. Be sure to place a towel between the ice and your skin to avoid irritation. Avoid using NSAIDs such as ibuprofen or naproxen , as these can increase bleeding risk. Continue to monitor for any new areas of bruising or signs of worsening symptoms.  Follow up with your primary care provider at your scheduled appointment on August 19, or sooner if the bruising spreads, becomes more painful, or new symptoms develop. Seek immediate medical attention if you experience fever, chills, body aches, nausea, vomiting, frequent diarrhea, or any other concerning symptoms.      ED Prescriptions   None    PDMP not reviewed this encounter.   Iola Lukes, [place] 05/15/24 1053

## 2024-05-15 NOTE — ED Triage Notes (Signed)
 Pt c/o rash to her breast for 2 days. Rash/redness started in between her breast and is spreading across her right breast to her underarm. Denies itching but states she is having right breast pain that is described as aching and throbbing.

## 2024-05-16 ENCOUNTER — Ambulatory Visit (HOSPITAL_COMMUNITY): Payer: Self-pay

## 2024-05-16 LAB — CBC WITH DIFFERENTIAL/PLATELET
Basophils Absolute: 0 x10E3/uL (ref 0.0–0.2)
Basos: 1 %
EOS (ABSOLUTE): 0.2 x10E3/uL (ref 0.0–0.4)
Eos: 3 %
Hematocrit: 33.2 % — ABNORMAL LOW (ref 34.0–46.6)
Hemoglobin: 10.5 g/dL — ABNORMAL LOW (ref 11.1–15.9)
Immature Grans (Abs): 0 x10E3/uL (ref 0.0–0.1)
Immature Granulocytes: 0 %
Lymphocytes Absolute: 1 x10E3/uL (ref 0.7–3.1)
Lymphs: 13 %
MCH: 28.3 pg (ref 26.6–33.0)
MCHC: 31.6 g/dL (ref 31.5–35.7)
MCV: 90 fL (ref 79–97)
Monocytes Absolute: 0.7 x10E3/uL (ref 0.1–0.9)
Monocytes: 9 %
Neutrophils Absolute: 5.6 x10E3/uL (ref 1.4–7.0)
Neutrophils: 74 %
Platelets: 178 x10E3/uL (ref 150–450)
RBC: 3.71 x10E6/uL — ABNORMAL LOW (ref 3.77–5.28)
RDW: 17.2 % — ABNORMAL HIGH (ref 11.7–15.4)
WBC: 7.6 x10E3/uL (ref 3.4–10.8)

## 2024-05-19 NOTE — Progress Notes (Signed)
 Cardiology Office Note:  .   Date:  05/21/2024  ID:  Katelyn [name], DOB 11/14/1955, MRN [id number] PCP: Ofilia Lamar CROME, MD  [facility] Providers Cardiologist:  Redell Leiter, MD    History of Present Illness: .   Katelyn [name] is a 68 y.o. female with a past medical history of nonobstructive CAD LHC 2021, hypertension, LBBB, chronic diastolic heart failure, PAD, pulmonary hypertension, NSVT, OSA, restrictive lung disease, GERD, DM 2, CKD stage III, thrombocytopenia, hyperlipidemia, cirrhosis.  04/04/2024 echo EF 45 to 50%, grade 1 DD 01/17/2023 echo EF 40 to 45%, mild concentric LVH, elevated LVEDP, mild TR, PASP moderately to severely elevated 10/06/2020 echo EF 55 to 60%, mild LVH 10/05/2020 cardiac cath nonobstructive CAD  She was admitted to [facility] on 03/13/2023 with altered mental status and found to be in acute renal failure creatinine elevated to 4.06, BUN 95.  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  She was hyponatremic although this had been somewhat of an ongoing issue for her for some time.  EF revealed mild LVH, EF 40 to 45%, elevated LA and LV end-diastolic pressures.  She was admitted [facility] 01/11/2024 to 02/01/2024 after presenting to the hospital with progressive weakness, difficulty walking, urinary retention, she was taken to the OR and underwent decompressive thoracic laminectomy with removal of hardware by [name].  She was transferred to the rehab unit on 01/11/2024 where she completed 21 days of rehab, and was subsequently discharged home with home health services.  Most like she was evaluated by myself on 02/11/24, she was doing well, working on weight loss but in wheelchair following hospitalization and working with home health.   She presents today accompanied by  her husband for follow-up.  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  She does not have any formal complaints from a cardiac perspective.  She will need to have right shoulder surgery with [name] however he does not think he would be able to do this till closer to the end of the year.  I did discuss with her due to her deconditioned state we will at least need to arrange for an ischemic evaluation to notify our office when she has a surgery date set.   ROS: Review of Systems  Constitutional: Negative.   HENT: Negative.    Eyes: Negative.   Respiratory: Negative.    Cardiovascular:  Positive for leg swelling. Negative for chest pain, palpitations, orthopnea and PND.  Gastrointestinal: Negative.   Genitourinary: Negative.   Musculoskeletal:  Positive for back pain and joint pain.  Skin: Negative.   Neurological: Negative.   Endo/Heme/Allergies: Negative.   Psychiatric/Behavioral: Negative.       Studies Reviewed: .        Cardiac Studies & Procedures   ______________________________________________________________________________________________ CARDIAC CATHETERIZATION  CARDIAC CATHETERIZATION 10/05/2020  Conclusion  Prox LAD lesion is 30% stenosed.  1st Diag lesion is 55% stenosed.  Dist Cx lesion is 40% stenosed.  Prox RCA lesion is 25% stenosed.  Dist RCA lesion is 25% stenosed.  There is mild left ventricular systolic dysfunction.  LV end diastolic pressure is moderately elevated.  The left ventricular ejection fraction is 50-55% by visual estimate.  Hemodynamic  findings consistent with mild pulmonary hypertension.  1. Nonobstructive CAD. Codominant circulation 2. Low normal LV systolic function. EF estimated at 50%. 3. Elevated LV filling pressures - PCWP 22 mm Hg, EDP 25 mm Hg 4. Mild pulmonary HTN with mean PAP 27 mm  Hg 5. Elevated RA pressures 16 mm Hg 6. Reduced cardiac output with index 2.5  Plan: medical management for CAD. Need to repeat Echo to assess LV and RV function. May need to consider pulmonary embolic disease given pulmonary HTN and elevated RA pressures. This may reflect elevated Left heart pressures. Morbid obesity and OSA may also be contributing.  Findings Coronary Findings Diagnostic  Dominance: Co-dominant  Left Main Vessel was injected. Vessel is normal in caliber. Vessel is angiographically normal.  Left Anterior Descending Prox LAD lesion is 30% stenosed.  First Diagonal Branch 1st Diag lesion is 55% stenosed.  Left Circumflex Dist Cx lesion is 40% stenosed.  Right Coronary Artery Prox RCA lesion is 25% stenosed. Dist RCA lesion is 25% stenosed.  Intervention  No interventions have been documented.   STRESS TESTS  MYOCARDIAL PERFUSION IMAGING 04/09/2019  Interpretation Summary  The left ventricular ejection fraction is normal (55-65%).  Nuclear stress EF: 56%.  There was no ST segment deviation noted during stress.  The study is normal.  This is a low risk study.   ECHOCARDIOGRAM  ECHOCARDIOGRAM COMPLETE 04/04/2024  Narrative ECHOCARDIOGRAM REPORT    Patient Name:   Katelyn [name]   Date of Exam: 04/04/2024 Medical Rec #:  [id number]     Height:       61.0 in Accession #:    [id number]    Weight:       186.0 lb Date of Birth:  Jan 10, 1956     BSA:          1.831 m Patient Age:    68 years      BP:           126/76 mmHg Patient Gender: F             HR:           76 bpm. Exam Location:  [address]  Procedure: 2D Echo, Color Doppler, Cardiac Doppler and 3D Echo (Both Spectral and Color Flow Doppler were utilized during procedure).  Indications:    Aortic heart murmur I35.8  History:        Patient has prior history of Echocardiogram examinations, most recent 01/17/2024. CAD, CKD, Arrythmias:LBBB; Risk Factors:Hypertension and  Diabetes.  Sonographer:    Augustin Seals RDCS Referring Phys: [phone] [address]  IMPRESSIONS   1. Left ventricular ejection fraction, by estimation, is 45 to 50%. Left ventricular ejection fraction by 3D volume is 45 %. The left ventricle has mildly decreased function. The left ventricle has no regional wall motion abnormalities. Left ventricular diastolic parameters are consistent with Grade I diastolic dysfunction (impaired relaxation). Significant dyssynchrony due to underlying LBBB. 2. Right ventricular systolic function is normal. The right ventricular size is normal. 3. The mitral valve is normal in structure. Trivial mitral valve regurgitation. No evidence of mitral stenosis. 4. The aortic valve is normal in structure. Aortic valve regurgitation is not visualized. No aortic stenosis is present. 5. The inferior vena cava is normal in size with greater than 50% respiratory variability, suggesting right atrial pressure of 3 mmHg.  FINDINGS Left Ventricle: Left ventricular ejection fraction, by estimation, is 45 to 50%. Left ventricular ejection fraction by 3D volume is 45 %. The left ventricle  has mildly decreased function. The left ventricle has no regional wall motion abnormalities. The left ventricular internal cavity size was normal in size. There is no left ventricular hypertrophy. Abnormal (paradoxical) septal motion, consistent with left bundle branch block. Left ventricular diastolic parameters are consistent with Grade I diastolic dysfunction (impaired relaxation).  Right Ventricle: The right ventricular size is normal. No increase in right ventricular wall thickness. Right ventricular systolic function is normal.  Left Atrium: Left atrial size was normal in size.  Right Atrium: Right atrial size was normal in size.  Pericardium: There is no evidence of pericardial effusion.  Mitral Valve: The mitral valve is normal in structure. Trivial mitral valve regurgitation. No  evidence of mitral valve stenosis.  Tricuspid Valve: The tricuspid valve is normal in structure. Tricuspid valve regurgitation is not demonstrated. No evidence of tricuspid stenosis.  Aortic Valve: The aortic valve is normal in structure. Aortic valve regurgitation is not visualized. No aortic stenosis is present.  Pulmonic Valve: The pulmonic valve was normal in structure. Pulmonic valve regurgitation is not visualized. No evidence of pulmonic stenosis.  Aorta: The aortic root is normal in size and structure.  Venous: The inferior vena cava is normal in size with greater than 50% respiratory variability, suggesting right atrial pressure of 3 mmHg.  IAS/Shunts: No atrial level shunt detected by color flow Doppler.  Additional Comments: 3D was performed not requiring image post processing on an independent workstation and was abnormal.   LEFT VENTRICLE PLAX 2D LVIDd:         3.60 cm         Diastology LVIDs:         2.80 cm         LV e' medial:    4.82 cm/s LV PW:         1.10 cm         LV E/e' medial:  11.4 LV IVS:        1.10 cm         LV e' lateral:   5.22 cm/s LVOT diam:     2.00 cm         LV E/e' lateral: 10.5 LV SV:         69 LV SV Index:   38 LVOT Area:     3.14 cm        3D Volume EF LV 3D EF:    Left ventricul ar ejection fraction by 3D volume is 45 %.  3D Volume EF: 3D EF:        45 % LV EDV:       160 ml LV ESV:       88 ml LV SV:        72 ml  RIGHT VENTRICLE             IVC RV Basal diam:  2.80 cm     IVC diam: 1.80 cm RV Mid diam:    3.30 cm RV S prime:     11.40 cm/s TAPSE (M-mode): 2.3 cm  LEFT ATRIUM             Index        RIGHT ATRIUM           Index LA diam:        4.90 cm 2.68 cm/m   RA Area:     10.30 cm LA Vol (A2C):   46.4 ml 25.34 ml/m  RA Volume:   17.90 ml  9.77  ml/m LA Vol (A4C):   56.0 ml 30.58 ml/m LA Biplane Vol: 55.2 ml 30.14 ml/m AORTIC VALVE LVOT Vmax:   106.00 cm/s LVOT Vmean:  73.900 cm/s LVOT VTI:    0.219  m  AORTA Ao Root diam: 2.90 cm Ao Asc diam:  3.30 cm  MITRAL VALVE MV Area (PHT): 3.12 cm    SHUNTS MV Decel Time: 243 msec    Systemic VTI:  0.22 m MV E velocity: 55.00 cm/s  Systemic Diam: 2.00 cm MV A velocity: 77.80 cm/s MV E/A ratio:  0.71  Aditya Sabharwal Electronically signed by Ria Commander Signature Date/Time: 04/04/2024/5:28:55 PM    Final          ______________________________________________________________________________________________      Risk Assessment/Calculations:             Physical Exam:   VS:  BP 130/80   Pulse 92   Ht 5' 1 (1.549 m)   Wt 196 lb (88.9 kg)   SpO2 96%   BMI 37.03 kg/m    Wt Readings from Last 3 Encounters:  05/21/24 196 lb (88.9 kg)  03/17/24 186 lb (84.4 kg)  02/29/24 187 lb 3.2 oz (84.9 kg)    GEN: Well nourished, well developed in no acute distress NECK: No JVD; No carotid bruits CARDIAC: RRR, soft systolic murmur, rubs, gallops RESPIRATORY:  Clear to auscultation without rales, wheezing or rhonchi  ABDOMEN: Soft, non-tender, non-distended EXTREMITIES:  +1 pitting edema; No deformity   ASSESSMENT AND PLAN: .   CAD-nonobstructive per LHC in 2021, Stable with no anginal symptoms. No indication for ischemic evaluation.  Continue aspirin  81 mg daily.  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 Continue Coreg  6.25 mg twice daily.  Currently on Farxiga 10 mg daily.  Other GDMT prohibited by kidney dysfunction. She follows with nephrology.   CKD -follows with nephrology.  LBBB - noted initially in 2017.  HLD -most recent LDL elevated at 104, she is statin intolerant, has undefined type of hepatitis. Previously referred to PharmD, started on Leqvio , will repeat FLP and CMET.   DM2 - follows with Dr. Nichole.   Avascular necrosis of her right shoulder-she will need surgery towards the end of  the year, once she meets with the surgeon they will send her office a formal request, at a minimum she will need a stress evaluation and I advised her of this.    Dispo: Follow up in 6 months.   Signed, Delon JAYSON Hoover, NP

## 2024-05-21 ENCOUNTER — Ambulatory Visit: Admitting: Cardiology

## 2024-05-21 ENCOUNTER — Encounter: Payer: Self-pay | Admitting: Cardiology

## 2024-05-21 VITALS — BP 130/80 | HR 92 | Ht 61.0 in | Wt 196.0 lb

## 2024-05-21 DIAGNOSIS — E440 Moderate protein-calorie malnutrition: Secondary | ICD-10-CM | POA: Insufficient documentation

## 2024-05-21 DIAGNOSIS — N1832 Chronic kidney disease, stage 3b: Secondary | ICD-10-CM

## 2024-05-21 DIAGNOSIS — I5020 Unspecified systolic (congestive) heart failure: Secondary | ICD-10-CM

## 2024-05-21 DIAGNOSIS — I447 Left bundle-branch block, unspecified: Secondary | ICD-10-CM | POA: Diagnosis not present

## 2024-05-21 DIAGNOSIS — I2510 Atherosclerotic heart disease of native coronary artery without angina pectoris: Secondary | ICD-10-CM | POA: Diagnosis not present

## 2024-05-21 DIAGNOSIS — I5022 Chronic systolic (congestive) heart failure: Secondary | ICD-10-CM

## 2024-05-21 DIAGNOSIS — I359 Nonrheumatic aortic valve disorder, unspecified: Secondary | ICD-10-CM | POA: Insufficient documentation

## 2024-05-21 DIAGNOSIS — I5189 Other ill-defined heart diseases: Secondary | ICD-10-CM | POA: Insufficient documentation

## 2024-05-21 DIAGNOSIS — E782 Mixed hyperlipidemia: Secondary | ICD-10-CM

## 2024-05-21 NOTE — Patient Instructions (Signed)
 Medication Instructions:  Your physician recommends that you continue on your current medications as directed. Please refer to the Current Medication list given to you today.  *If you need a refill on your cardiac medications before your next appointment, please call your pharmacy*  Lab Work: Your physician recommends that you return for lab work in: Today for CMP and Fasting Lipid Panel  If you have labs (blood work) drawn today and your tests are completely normal, you will receive your results only by: MyChart Message (if you have MyChart) OR A paper copy in the mail If you have any lab test that is abnormal or we need to change your treatment, we will call you to review the results.  Testing/Procedures: NONE  Follow-Up: At [facility], you and your health needs are our priority.  As part of our continuing mission to provide you with exceptional heart care, our providers are all part of one team.  This team includes your primary Cardiologist (physician) and Advanced Practice Providers or APPs (Physician Assistants and Nurse Practitioners) who all work together to provide you with the care you need, when you need it.  Your next appointment:   6 month(s)  Provider:   Delon Hoover, NP [place])    We recommend signing up for the patient portal called MyChart.  Sign up information is provided on this After Visit Summary.  MyChart is used to connect with patients for Virtual Visits (Telemedicine).  Patients are able to view lab/test results, encounter notes, upcoming appointments, etc.  Non-urgent messages can be sent to your provider as well.   To learn more about what you can do with MyChart, go to [email or web address].   Other Instructions

## 2024-05-22 LAB — COMPREHENSIVE METABOLIC PANEL WITH GFR
ALT: 36 IU/L — ABNORMAL HIGH (ref 0–32)
AST: 46 IU/L — ABNORMAL HIGH (ref 0–40)
Albumin: 3.2 g/dL — ABNORMAL LOW (ref 3.9–4.9)
Alkaline Phosphatase: 369 IU/L — ABNORMAL HIGH (ref 44–121)
BUN/Creatinine Ratio: 23 (ref 12–28)
BUN: 24 mg/dL (ref 8–27)
Bilirubin Total: 0.4 mg/dL (ref 0.0–1.2)
CO2: 27 mmol/L (ref 20–29)
Calcium: 8.8 mg/dL (ref 8.7–10.3)
Chloride: 101 mmol/L (ref 96–106)
Creatinine, Ser: 1.06 mg/dL — ABNORMAL HIGH (ref 0.57–1.00)
Globulin, Total: 2.7 g/dL (ref 1.5–4.5)
Glucose: 93 mg/dL (ref 70–99)
Potassium: 3.7 mmol/L (ref 3.5–5.2)
Sodium: 141 mmol/L (ref 134–144)
Total Protein: 5.9 g/dL — ABNORMAL LOW (ref 6.0–8.5)
eGFR: 58 mL/min/1.73 — ABNORMAL LOW (ref >59)

## 2024-05-22 LAB — LIPID PANEL
Chol/HDL Ratio: 1.7 ratio (ref 0.0–4.4)
Cholesterol, Total: 105 mg/dL (ref 100–199)
HDL: 62 mg/dL (ref >39)
LDL Chol Calc (NIH): 31 mg/dL (ref 0–99)
Triglycerides: 49 mg/dL (ref 0–149)
VLDL Cholesterol Cal: 12 mg/dL (ref 5–40)

## 2024-05-26 ENCOUNTER — Ambulatory Visit: Payer: Self-pay | Admitting: Cardiology

## 2024-05-26 ENCOUNTER — Other Ambulatory Visit: Payer: Self-pay

## 2024-05-26 MED ORDER — CARVEDILOL 6.25 MG PO TABS: 6.2500 mg | ORAL_TABLET | Freq: Two times a day (BID) | ORAL | 3 refills | Status: DC

## 2024-05-27 ENCOUNTER — Telehealth: Payer: Self-pay

## 2024-05-27 NOTE — Telephone Encounter (Signed)
 Left message on My Chart with lab results per Dr. Karry note. Routed to PCP

## 2024-06-02 ENCOUNTER — Telehealth: Payer: Self-pay

## 2024-06-02 NOTE — Telephone Encounter (Signed)
 Lab Results reviewed with pt as per Dr. Karry note.  Pt verbalized understanding and had no additional questions. Routed to PCP

## 2024-06-25 ENCOUNTER — Telehealth: Payer: Self-pay | Admitting: Cardiology

## 2024-06-25 NOTE — Telephone Encounter (Signed)
 Pt c/o swelling/edema: STAT if pt has developed SOB within 24 hours  If swelling, where is the swelling located?   Legs  How much weight have you gained and in what time span?   Yes  Have you gained 2 pounds in a day or 5 pounds in a week?   Yes  Do you have a log of your daily weights (if so, list)?   Not available  Are you currently taking a fluid pill?   Yes  Are you currently SOB?  A little SOB  Have you traveled recently in a car or plane for an extended period of time?   No  Patient is concerned she has been retaining fluid and has an issue controlling her urinating.  Patient noted she had back surgery and has trouble walking.

## 2024-06-25 NOTE — Telephone Encounter (Signed)
 Spoke with pt who states that she has increased swelling lower legs into thighs. Pt states that the swelling is causing pain due to the tightness. Pt has had some weeping in her lower extremities. Pt reports a little shob but able to speak full sentences. Pt RX for Torsemide  is 20 mg BID but she decreased it to 20 mg daily because of having to urinate so much. Advised that she needed to increase her medication as prescribed. Please advise

## 2024-06-28 ENCOUNTER — Other Ambulatory Visit: Payer: Self-pay | Admitting: Infectious Diseases

## 2024-06-30 ENCOUNTER — Encounter: Payer: Self-pay | Admitting: Infectious Diseases

## 2024-06-30 ENCOUNTER — Other Ambulatory Visit: Payer: Self-pay

## 2024-06-30 ENCOUNTER — Ambulatory Visit (INDEPENDENT_AMBULATORY_CARE_PROVIDER_SITE_OTHER): Admitting: Infectious Diseases

## 2024-06-30 VITALS — BP 148/84 | HR 89 | Temp 98.6°F

## 2024-06-30 DIAGNOSIS — M4645 Discitis, unspecified, thoracolumbar region: Secondary | ICD-10-CM

## 2024-06-30 DIAGNOSIS — B9689 Other specified bacterial agents as the cause of diseases classified elsewhere: Secondary | ICD-10-CM | POA: Diagnosis not present

## 2024-06-30 DIAGNOSIS — K7460 Unspecified cirrhosis of liver: Secondary | ICD-10-CM | POA: Diagnosis not present

## 2024-06-30 DIAGNOSIS — G061 Intraspinal abscess and granuloma: Secondary | ICD-10-CM

## 2024-06-30 DIAGNOSIS — Z79899 Other long term (current) drug therapy: Secondary | ICD-10-CM | POA: Diagnosis not present

## 2024-06-30 DIAGNOSIS — T85192D Other mechanical complication of implanted electronic neurostimulator (electrode) of spinal cord, subsequent encounter: Secondary | ICD-10-CM

## 2024-06-30 MED ORDER — CIPROFLOXACIN HCL 500 MG PO TABS: 500.0000 mg | ORAL_TABLET | Freq: Two times a day (BID) | ORAL | 2 refills | Status: DC

## 2024-06-30 NOTE — Progress Notes (Unsigned)
 Patient Active Problem List   Diagnosis Date Noted  . Aortic valve disorder 05/21/2024  . Diastolic dysfunction 05/21/2024  . Malnutrition of moderate degree Gregoria: 60% to less than 75% of standard weight) (HCC) 05/21/2024  . Right shoulder pain 04/10/2024  . 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 02/19/2024  . Medication management 02/19/2024  . Presence of retained hardware 02/19/2024  . Neurogenic bladder 02/10/2024  . Neuropathic pain 02/06/2024  . Chronic pain syndrome 01/25/2024  . Thoracic myelopathy 01/11/2024  . Thoracic spondylosis with myelopathy 01/07/2024  . Hepatic cirrhosis (HCC) 11/22/2023  . ACS (acute coronary syndrome) (HCC) 04/06/2023  . Acute sciatica 04/06/2023  . Dyslipidemia 04/06/2023  . Hypertensive heart and chronic kidney disease 04/06/2023  . Hyponatremia 04/06/2023  . Hypothermia 04/06/2023  . Renal insufficiency 04/06/2023  . SIRS (systemic inflammatory response syndrome) (HCC) 04/06/2023  . Slurred speech 04/06/2023  . Thrombocytopenia 04/06/2023  . Ventricular tachycardia, nonsustained (HCC) 04/06/2023  . 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 Acute renal failure (ARF) (HCC) 03/13/2023  . Elevated LFTs 03/13/2023  . AKI (acute kidney injury) (HCC) 02/20/2023  . Status post thoracic spinal fusion 02/19/2023  . Discitis 02/13/2023  . Malfunction of spinal cord stimulator (HCC) 02/13/2023  . Serratia 02/13/2023  . Diabetes mellitus without complication (HCC) 02/13/2023  . Osteomyelitis (HCC) 02/05/2023  . 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  . Infection of spinal cord stimulator (HCC) 12/07/2022  . Lactic acidosis 12/06/2022  . Hypomagnesemia 12/06/2022  . Anemia of chronic disease 12/06/2022  . Diskitis 12/05/2022  . Status post insertion of spinal cord stimulator 11/22/2022  . Acute thoracic back pain 06/22/2022  . Other long term (current) drug therapy 06/22/2022  . Lumbar spondylosis 06/22/2022  . Thoracic spondylosis 06/22/2022  .  Bilateral sacroiliitis (HCC) 06/22/2022  . Chronic, continuous use of opioids 05/31/2022  . Pain medication agreement 05/02/2022  . Postlaminectomy syndrome of lumbar region 05/02/2022  . Radiculopathy of thoracolumbar region 05/02/2022  . Class 2 severe obesity due to excess calories with serious comorbidity and body mass index (BMI) of 37.0 to 37.9 in adult [facility]) 05/02/2022  . Impaired mobility and ADLs 05/02/2022  . Mastodynia 04/13/2022  . Peripheral artery disease (HCC) 07/11/2021  . COVID-19 virus infection 06/21/2021  . Rib pain on left side 06/06/2021  . Abdominal pain 06/05/2021  . Diabetes 1.5, 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  . 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  . GAD (generalized anxiety disorder) 11/25/2020  . Anxiety and depression 11/03/2020  . DM2 (diabetes mellitus, type 2) (HCC)   . Restless leg syndrome   . PONV (postoperative nausea and vomiting)   . Neuropathy   . Lumbar stenosis   . Left bundle branch block   . Hypercholesterolemia   . Heart murmur   . History of kidney stones   . H/O hiatal hernia   . Essential hypertension   . Dyspnea   . Chronic depression   . Complication of anesthesia   . CKD stage 3b, GFR 30-44 ml/min (HCC)   . CAD (coronary artery disease)   . Bursitis of right hip   . Arthritis   . Anxiety   . Morbid obesity (HCC) 10/07/2020  . Hypoxia 10/07/2020  . History of esophageal stricture 10/07/2020  . CKD (chronic kidney disease), stage III (HCC) 10/07/2020  . Leg weakness 10/07/2020  . UTI (urinary tract infection) 10/07/2020  . Chronic hypoxemic respiratory failure (HCC) 10/07/2020  . Pulmonary hypertension (HCC) 10/06/2020  . Chest pain, neg  MI, stable CAD non obstructive on cath 10/05/20 10/04/2020  . Hardening of the aorta (main artery of the heart) 11/25/2019  . Hypertensive heart disease with congestive heart failure and chronic kidney disease (HCC) 11/25/2019  . Shortness of breath 06/11/2019  . Fatty liver  04/08/2019  . Migraine 04/08/2019  . History of chronic kidney disease 04/08/2019  . Migraine without aura and responsive to treatment 04/08/2019  . Acute non-recurrent sinusitis 10/12/2017  . Chronic diastolic CHF (congestive heart failure) (HCC) 08/20/2017  . Genetic testing 08/17/2017  . Family history of ovarian cancer 08/17/2017  . Family history of breast cancer 08/17/2017  . Cough 04/19/2017  . DOE (dyspnea on exertion) 04/19/2017  . Type 2 diabetes mellitus with diabetic neuropathy, unspecified (HCC) 01/16/2017  . Hypertensive heart disease with heart failure (HCC) 01/01/2017  . Hyperlipidemia 01/01/2017  . LBBB (left bundle branch block) 01/01/2017  . Elevated liver enzymes 12/05/2016  . Meralgia paraesthetica 12/05/2016  . Tinnitus 12/05/2016  . Spinal stenosis of lumbar region with neurogenic claudication 12/05/2016  . Wound dehiscence 10/23/2016  . S/P lumbar spinal fusion 08/29/2016  . OSA (obstructive sleep apnea) 05/24/2016  . 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  . Chronic low back pain without sciatica 03/14/2016  . GERD (gastroesophageal reflux disease) 03/14/2016  . Gout 03/14/2016  . Iron  deficiency anemia due to chronic blood loss 03/14/2016  . Type 2 diabetes mellitus without complication, with long-term current use of insulin  (HCC) 03/14/2016  . Recurrent major depression in remission 12/07/2015  . Restless leg 12/07/2015  . Insomnia 12/07/2015  . S/P lumbar laminectomy 11/26/2015  . Postprocedural state 11/26/2015  . Mild CAD 11/24/2015  . Lumbar radiculopathy 10/15/2015  . History of blood transfusion 2016  . Kidney stone 04/20/2014  . Stricture of esophagus 04/20/2014  . Greater trochanteric bursitis of right hip 02/02/2012  . Iliotibial band syndrome of right side 02/02/2012  . Iliotibial band syndrome 02/02/2012  . Bursitis, trochanteric 02/02/2012    Patient's Medications  New Prescriptions   No medications on file  Previous Medications    CARVEDILOL  (COREG ) 6.25 MG TABLET    Take 1 tablet (6.25 mg total) by mouth 2 (two) times daily with a meal.   CIPROFLOXACIN  (CIPRO ) 500 MG TABLET    Take 1 tablet (500 mg total) by mouth 2 (two) times daily.   DICLOFENAC  SODIUM (VOLTAREN ) 1 % GEL    Apply 2 g topically 4 (four) times daily.   DOXYCYCLINE  (VIBRA -TABS) 100 MG TABLET    Take 1 tablet (100 mg total) by mouth 2 (two) times daily.   DULOXETINE  (CYMBALTA ) 60 MG CAPSULE    Take 1 capsule (60 mg total) by mouth 2 (two) times daily.   MELATONIN 5 MG TABS    Take 1 tablet (5 mg total) by mouth at bedtime as needed.   MIRABEGRON  ER (MYRBETRIQ ) 25 MG TB24 TABLET    Take 1 tablet (25 mg total) by mouth daily for 14 days, THEN 2 tablets (50 mg total) daily for 16 days. If you do not pee for > 6 hours, stop medication and call your physician.   MIRTAZAPINE  (REMERON ) 30 MG TABLET    Take 30 mg by mouth at bedtime.   MULTIPLE VITAMIN (MULTIVITAMIN WITH MINERALS) TABS TABLET    Take 1 tablet by mouth daily.   MULTIPLE VITAMINS-MINERALS (HAIR SKIN AND NAILS FORMULA) TABS    Take 1 tablet by mouth daily.   NOVOLOG  RELION 100 UNIT/ML INJECTION    Inject 2-20 Units  into the skin 3 (three) times daily with meals.   PANTOPRAZOLE  (PROTONIX ) 40 MG TABLET    Take 1 tablet by mouth once daily   POLYETHYLENE GLYCOL POWDER (GLYCOLAX /MIRALAX ) 17 GM/SCOOP POWDER    Take 17 g by mouth daily.   PRAMIPEXOLE  (MIRAPEX ) 1 MG TABLET    Take 1 mg by mouth at bedtime.   PREGABALIN  (LYRICA ) 75 MG CAPSULE    Take 1 capsule (75 mg total) by mouth 3 (three) times daily.   SENNA-DOCUSATE (SENOKOT-S) 8.6-50 MG TABLET    Take 2 tablets by mouth 2 (two) times daily.   TIRZEPATIDE (MOUNJARO) 5 MG/0.5ML PEN    Inject 7 mg into the skin once a week.   TORSEMIDE  (DEMADEX ) 20 MG TABLET    Take 1 tablet by mouth twice daily   TRESIBA  FLEXTOUCH 200 UNIT/ML FLEXTOUCH PEN    Inject 40 Units into the skin daily with breakfast.  Modified Medications   No medications on file  Discontinued  Medications   No medications on file   Subjective Discussed the use of AI scribe software for clinical note transcription with the patient, who gave verbal consent to proceed.   68 year old female with prior postop infection with Serratia marcescens in 2017, followed by T11-T12 discitis/osteomyelitis with epidural phlegmon with spinal cord stimulator in place, with negative cultures 11/2022 s/p course of vancomycin  and cefepime  complicated with AKI due to vancomycin , switched later to Ciprofloxacin  and doxycycline  but worsening pain after stimulator program changed with MRI showing progressive discitis/osteomyelitis s/p thoracic laminectomy with medial facetectomy T11-T12 and posterior fixation T12-L2, instrumented posterior fixation and removal of [place] stimulator ( cx NG) discharged on empiric daptomycin  and cefepime  but later cefepime  changed to ertapenem  in the setting of AMS and acute renal failure> PO doxycycline  and ciprofloxacin  suppression who is here for HFU after recent admission 4/2-4/25 for electively surgery in the setting of acute onset paraparesis. No prior fevers, chills, malaise, nausea, vomiting. Compliant with OP doxy/cipro .    She is s/p decompressive thoracic laminectomy, medial facetectomy, exploration of fusion t10-l2 with removal of segmental instrumentation, removal of loosened t10 pedicle screws and replacement of b/l l1 pedicle screws, removal and replacement of of left t12 pedicle screw. Per OR note, no evidence of significant infection. There was no pus. This looks more like a chronic pseudoarthrosis or chronic scarring or inflammation than anything else. . Or cx no organisms on gram stain and no growth to date   3/31 CT T spine  Erosive endplate changes at T9-10 and T11-12 are unchanged since 12/19/2023 and remain concerning for discitis-osteomyelitis. Poorly defined epidural contrast enhancement at the T9-10 level, concerning for epidural abscess. MRI with and without  contrast would be more definitive. Lucency about the left T10 transpedicular screws unchanged. Initially on daptomycin  and ertapenem , however antibiotics were switched to IV ceftriaxone  and ciprofloxacin  once Or Cx isolated as c acnes + elevated LFT. Hospital course significant for CT with Rotator cuff tear, managed with analgesics. US  liver with? Cirrhosis.   Patient's discharged on 4/25 to complete ceftriaxone  and ciprofloxacin  through 5/14   5/13 Here for fu with her husband. Getting IV ceftriaxone  through PICC without any concerns. Taking ciprofloxacin  as well. No issues with antibiotics. She is not able to walk independently and mostly bound to wheelchair.   6/9 Accompanied by husband. Reports doxycycline  and ciprofloxacin  without missed doses or concerns. She has not yet seen the neurosurgeon but has an appointment scheduled for Thursday.  She continues to experience unsteadiness and  is unable to walk, receiving home health care services including occupational therapy. Last week, she fell in the kitchen when an [place] on rollers moved as she tried to steady herself. She did not lose consciousness or sustain injuries, and her husband assisted her by using a blanket to help her move to a recliner. Otherwise no new changes since last visit.   7/3 Accompanied by husband. She was seen at Orthopedics Dr Melita 6/30 for severe rt shoulder pain, referred by Dr Joshua when seen with him. Xray was done which showed progressive collapse of the humeral head most consistent with avascular necrosis, The humeral head is completely flattened now ( no results to review). Infectious cause although not ruled out by aspiration, it was considered less likely and avascular necrosis likely related to steroid use was considered more likely. No plans for shoulder replacement anytime soon as she is also on prolonged antibiotics for her back infection.   She reports worsening rt shoulder pain since hospitalization in April,  described as feeling like 'somebody's ripping my arm off,' She recently completed a five-day course of prednisone for shoulder pain. She denies frequent steroid use. Denies fevers, chills, nausea, vomiting, or diarrhea, and no other joint pain except occasional popping in her reconstructed knee. ROM of rt shoulder is limited due to pain, denies  swelling or erythema or warmth in the rt shoulder. Next fu with supple is in 6 months .   9/22 Fluid pills up, mybertiq working opposite, called cardiology, Rt shoulder fu appt wit Dr Melita in dec 2025.   Review of Systems: All systems reviewed with pertinent positives and negatives as listed above.   Past Medical History:  Diagnosis Date  . Anxiety   . Arthritis   . Bursitis of right hip   . CAD (coronary artery disease)    Cardiac catheterization June 2014 in [facility] - 50% circumflex stenosis  . Chest pain, neg MI, stable CAD non obstructive on cath 10/05/20 10/04/2020  . Chronic diastolic heart failure (HCC) 08/20/2017  . Chronic kidney disease, stage 3 (HCC)    does not see nephrologist  . Chronic low back pain without sciatica 03/14/2016  . Cirrhosis of liver (HCC)   . CKD (chronic kidney disease), stage III (HCC) 10/07/2020  . Complication of anesthesia   . Cough 04/19/2017   Overview:  Last Assessment & Plan:  Formatting of this note may be different from the original. Cough - ? ACE related with AR triggers   Plan  Patient Instructions  Discuss with your primary doctor that lisinopril  pain, need making your cough worse. May use Mucinex  DM twice daily as needed for cough and congestion Zyrtec 10 mg at bedtime as needed for drainage Saline nasal spray as needed. Lab tests today Activity as tolerated. Follow with Dr. Shellia in 3-4 months and As needed   Please contact office for sooner follow up if symptoms do not improve or worsen or seek emergency care   . Depression   . Dyspnea    with exertion    lazy lung - per  Dr Shellia from back  issues- 06/2016  . Elevated liver enzymes 12/05/2016  . Essential hypertension   . GERD (gastroesophageal reflux disease)   . Gout 03/14/2016  . Greater trochanteric bursitis of right hip 02/02/2012  . H/O hiatal hernia   . Heart murmur   . History of blood transfusion 2016  . History of esophageal stricture 10/07/2020  . History of kidney stones   .  Hypercholesterolemia   . Hypertensive heart disease with heart failure (HCC) 01/01/2017  . Hypoxia 10/07/2020  . Iliotibial band syndrome of right side 02/02/2012  . Iron  deficiency anemia due to chronic blood loss 03/14/2016  . LBBB (left bundle branch block) 01/01/2017  . Left bundle branch block   . Leg weakness 10/07/2020  . Lumbar stenosis   . Meralgia paraesthetica 12/05/2016  . Mild CAD 11/24/2015  . Morbid obesity (HCC) 10/07/2020  . Neuropathy   . OSA (obstructive sleep apnea) 05/24/2016   Overview:  Managed PULM- no CPAP  . PONV (postoperative nausea and vomiting)    no N/V with patch  . Restless leg syndrome   . S/P lumbar laminectomy 11/26/2015  . S/P lumbar spinal fusion 08/29/2016  . Tinnitus 12/05/2016  . Type 2 diabetes mellitus (HCC)   . UTI (urinary tract infection) 10/07/2020   Past Surgical History:  Procedure Laterality Date  . ABDOMINAL HYSTERECTOMY  1983  . APPENDECTOMY  Age 76  . BACK SURGERY     FIRST LUMBAR FUSION/ SURGERY APRIL 2012 AND FUSION WITH INSTRUMENTATION SEPT 2012  . CARDIAC CATHETERIZATION     x 2  . CARPAL TUNNEL RELEASE     bil  . CHOLECYSTECTOMY  1990's  . COLONOSCOPY  12/12/2017   Colonic polyp status post polypectomy. Mild sigmoid diverticulosis. Otherwise normal colonoscopy to terminal ileum.  . CYSTO EXTRACTION KIDNEY STONES    . ESOPHAGOGASTRODUODENOSCOPY  12/12/2017   Small hiatal hernia. Mild gastritis. Status post esophageal dilatation.  SABRA EXCISION/RELEASE BURSA HIP  02/02/2012   Procedure: EXCISION/RELEASE BURSA HIP;  Surgeon: Tanda DELENA Heading, MD;  Location: [facility] ORS;   Service: Orthopedics;  Laterality: Right;  Right Hip Bursectomy  . EYE SURGERY Bilateral    cataracts  . IR THORACIC DISC ASPIRATION W/IMG GUIDE  02/14/2023  . KIDNEY STONE SURGERY  2008  . LAMINECTOMY WITH POSTERIOR LATERAL ARTHRODESIS LEVEL 3 N/A 01/09/2024   Procedure: Posterior lateral fusion - Thoracic Seven-Thoracic Eight - Thoracic Eight-Thoracic Nine - Thoracic Nine-Thoracic Ten, Thoracic Ten-Eleven, Thoracic Eleven-Twelve, Thoracic Twelve -Lumbar One, extension of fusion and removal of Thoracic Ten screws;  Surgeon: Joshua Alm Hamilton, MD;  Location: [facility] OR;  Service: Neurosurgery;  Laterality: N/A;  Posterior lateral fusion - Thoracic Seven-Thora  . LAMINECTOMY WITH POSTERIOR LATERAL ARTHRODESIS LEVEL 4 N/A 02/19/2023   Procedure: THORACIC TEN-LUMBAR TWO INSTRUMENTED FUSION;  Surgeon: Joshua Alm RAMAN, MD;  Location: [facility] OR;  Service: Neurosurgery;  Laterality: N/A;  . Left knee surgery x 2  1996   reconstruction  . LUMBAR DISC SURGERY  08/2016  . LUMBAR LAMINECTOMY/DECOMPRESSION MICRODISCECTOMY Right 11/26/2015   Procedure: Extraforaminal Microdiscectomy  - Lumbar two-three- right;  Surgeon: Alm RAMAN Joshua, MD;  Location: [facility];  Service: Neurosurgery;  Laterality: Right;  right   . LUMBAR WOUND DEBRIDEMENT N/A 10/25/2016   Procedure: Lumbar wound revision;  Surgeon: Alm RAMAN Joshua, MD;  Location: [facility] OR;  Service: Neurosurgery;  Laterality: N/A;  Lumbar wound revision  . Right shoulder surgery  2010   spur  . RIGHT/LEFT HEART CATH AND CORONARY ANGIOGRAPHY N/A 10/05/2020   Procedure: RIGHT/LEFT HEART CATH AND CORONARY ANGIOGRAPHY;  Surgeon: Swaziland, Peter M, MD;  Location: [facility] INVASIVE CV LAB;  Service: Cardiovascular;  Laterality: N/A;  . SPINAL CORD STIMULATOR REMOVAL  02/19/2023   Procedure: LUMBAR SPINAL CORD STIMULATOR REMOVAL;  Surgeon: Joshua Alm RAMAN, MD;  Location[facility] OR;  Service: Neurosurgery;;    Social History   Tobacco Use  . Smoking  status: Never  . Smokeless tobacco:  Never  . Tobacco comments:    Prior secondhand smoke  Vaping Use  . Vaping status: Never Used  Substance Use Topics  . Alcohol use: Not Currently  . Drug use: No    Family History  Problem Relation Age of Onset  . Lung cancer Father        smoked  . Hypertension Father   . Stroke Father   . Heart failure Mother   . Bone cancer Sister   . Asthma Sister     Allergies  Allergen Reactions  . Atorvastatin Calcium      Other Reaction(s): Not available  atorvastatin calcium   . Oxycodone  Other (See Comments)    Confusion, staring episodes  Other Reaction(s): Not available  oxycodone   . Atorvastatin Nausea And Vomiting and Other (See Comments)    MYALGIAS  . Pentazocine Other (See Comments)    headache  Other Reaction(s): Not available  pentazocine  . Cefepime  Other (See Comments)    Presumed AMS/neurotoxicity in the setting of AKI  cefepime   . Gabapentin  Swelling  . Other Nausea Only    UNSPECIFIED Anesthesia    Health Maintenance  Topic Date Due  . OPHTHALMOLOGY EXAM  Never done  . Diabetic kidney evaluation - Urine ACR  Never done  . Mammogram  Never done  . COVID-19 Vaccine (3 - Pfizer risk series) 12/13/2020  . DEXA SCAN  Never done  . FOOT EXAM  04/14/2023  . Medicare Annual Wellness (AWV)  05/27/2024  . HEMOGLOBIN A1C  07/03/2024  . Diabetic kidney evaluation - eGFR measurement  05/21/2025  . Pneumococcal Vaccine: 50+ Years (4 of 4 - PCV20 or PCV21) 04/01/2027  . Colonoscopy  12/13/2027  . DTaP/Tdap/Td (4 - Td or Tdap) 03/31/2032  . Influenza Vaccine  Completed  . Hepatitis C Screening  Completed  . Zoster Vaccines- Shingrix  Completed  . HPV VACCINES  Aged Out  . Meningococcal B Vaccine  Aged Out  . Hepatitis B Vaccines 19-59 Average Risk  Discontinued    Objective: BP (!) 148/84   Pulse 89   Temp 98.6 F (37 C) (Oral)   SpO2 97%   Physical Exam Constitutional:      Appearance: Normal appearance. Morbidly obese  HENT:     Head:  Normocephalic and atraumatic.      Mouth: Mucous membranes are moist.  Eyes:    Conjunctiva/sclera: Conjunctivae normal.     Pupils: Pupils are equal, round, and b/l symmetrical    Cardiovascular:     Rate and Rhythm: Normal rate and regular rhythm.     Heart sounds:  Pulmonary:     Effort: Pulmonary effort is normal.     Breath sounds:   Abdominal:     General: Non distended     Palpations:  Musculoskeletal:        General:sitting in the wheel chair, rt shoulder with no swelling, erythema, warmth, no signs of infection.  ROM of rt shoulder restricted due to pain.   Skin:    General: Skin is warm and dry.     Comments: Posterior thoracolumbar surgical wound has healed   Neurological:     General:     Mental Status: awake, alert and oriented to person, place, and time.   Psychiatric:        Mood and Affect: Mood normal.   Lab Results Lab Results  Component Value Date   WBC 7.6 05/15/2024   HGB 10.5 (L) 05/15/2024  HCT 33.2 (L) 05/15/2024   MCV 90 05/15/2024   PLT 178 05/15/2024    Lab Results  Component Value Date   CREATININE 1.06 (H) 05/21/2024   BUN 24 05/21/2024   NA 141 05/21/2024   K 3.7 05/21/2024   CL 101 05/21/2024   CO2 27 05/21/2024    Lab Results  Component Value Date   ALT 36 (H) 05/21/2024   AST 46 (H) 05/21/2024   GGT 207 (H) 01/16/2024   ALKPHOS 369 (H) 05/21/2024   BILITOT 0.4 05/21/2024    Lab Results  Component Value Date   CHOL 105 05/21/2024   HDL 62 05/21/2024   LDLCALC 31 05/21/2024   TRIG 49 05/21/2024   CHOLHDL 1.7 05/21/2024   No results found for: LABRPR, RPRTITER No results found for: HIV1RNAQUANT, HIV1RNAVL, CD4TABS   Microbiology Results for orders placed or performed during the hospital encounter of 01/07/24  MRSA Next Gen by PCR, Nasal     Status: None   Collection Time: 01/09/24 11:35 AM   Specimen: Nasal Mucosa; Nasal Swab  Result Value Ref Range Status   MRSA by PCR Next Gen NOT DETECTED NOT  DETECTED Final    Comment: (NOTE) The GeneXpert MRSA Assay (FDA approved for NASAL specimens only), is one component of a comprehensive MRSA colonization surveillance program. It is not intended to diagnose MRSA infection nor to guide or monitor treatment for MRSA infections. Test performance is not FDA approved in patients less than 67 years old. Performed at [facility], [address] [place], [place] [address]   Aerobic/Anaerobic Culture w Gram Stain (surgical/deep wound)     Status: None   Collection Time: 01/09/24  4:34 PM   Specimen: Intervertebral Disc; Tissue  Result Value Ref Range Status   Specimen Description WOUND  Final   Special Requests INTERVERTERAL DISC 9,10 PT ON ANCEF   Final   Gram Stain NO WBC SEEN NO ORGANISMS SEEN   Final   Culture   Final    No growth aerobically or anaerobically. Performed at [facility], [address], [place], [place] [address]    Report Status 01/14/2024 FINAL  Final  Aerobic/Anaerobic Culture w Gram Stain (surgical/deep wound)     Status: None   Collection Time: 01/09/24  4:44 PM   Specimen: Intervertebral Disc; Tissue  Result Value Ref Range Status   Specimen Description WOUND  Final   Special Requests INTERVERTEBRAL DISC 9,10 SWAB PT ON ANCEF   Final   Gram Stain NO WBC SEEN NO ORGANISMS SEEN   Final   Culture   Final    RARE CUTIBACTERIUM ACNES Standardized susceptibility testing for this organism is not available. Performed at [facility], [address], [place], [place] [address]    Report Status 01/15/2024 FINAL  Final   Imaging CT T spine 01/08/24 1. Severe thecal sac attenuation at the T9-10 level by soft tissue density material, likely epidural phlegmon/abscess. 2. 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 3. Unchanged endplate erosion at T9-10 and T11-12  Assessment/Plan # Thoracic epidural abscess complicated with hardware   - ciprofloxacin  and ceftriaxone  through 5/14> PO doxycycline  and ciprofloxacin  thereafter  Plan  - continue doxycycline  and ciprofloxacin  as is. She is hesitant to stop antibiotics anytime soon as she cannot risk another recurrence of infection - Discussed side effects of antibiotics including aortic aneurysm/dissection, arthropathy/arthralgia, CNS effects, C diff infection, glucose dysregulation, peripheral neuropathy, tendinopathy/tendon  rupture, qtc prolongation, exacerbation of Myaesthenia gravis etc with ciprofloxacin  - Fu in 3 months  - Fu with Nsx as planned   # RT shoulder pain/Possible avascular necrosis - Followed by Orthopedics Dr Melita. 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, conservative management recommended, next fu in 6 months  - Discussed signs and symptoms that could suggest superimposed infection   # Medication management  - Labs today - EKG done at the office 5/13 Qtc 463  # Morbid Obesity  - will benefit from measures to loose weight   # Chronic LBP with radiculopathy/Lower extremity weakness - followed by pain medicine, last seen 03/05/24  # Liver cirrhosis - 03/14/23 acute hep panel negative, needs HCC screening with US  liver every 6 months  - Follows [facility], last seen 5/30  I spent 30 minutes involved in face-to-face and non-face-to-face activities for this patient on the day of the visit. Professional time spent includes the following activities: Preparing to see the patient (review of tests), reviewed last notes from Orthopedics, Performing a medically appropriate examination and evaluation , Ordering labs, Documenting clinical information in the EMR, Independently interpreting results (not separately reported), Communicating results to the patient/husband, Counseling and educating the patient/husband and Care coordination (not separately reported).   Of note, portions of this note may have been created with voice recognition software. While  this note has been edited for accuracy, occasional wrong-word or 'sound-a-like' substitutions may have occurred due to the inherent limitations of voice recognition software.   Annalee Joseph, MD [facility] 06/30/2024, 2:39 PM

## 2024-07-02 ENCOUNTER — Encounter (HOSPITAL_BASED_OUTPATIENT_CLINIC_OR_DEPARTMENT_OTHER): Payer: Self-pay

## 2024-07-02 ENCOUNTER — Ambulatory Visit (HOSPITAL_BASED_OUTPATIENT_CLINIC_OR_DEPARTMENT_OTHER)
Admission: EM | Admit: 2024-07-02 | Discharge: 2024-07-02 | Disposition: A | Discharge status: Home / Self Care | Source: Home / Self Care

## 2024-07-02 DIAGNOSIS — M79652 Pain in left thigh: Secondary | ICD-10-CM | POA: Insufficient documentation

## 2024-07-02 DIAGNOSIS — R350 Frequency of micturition: Secondary | ICD-10-CM | POA: Insufficient documentation

## 2024-07-02 DIAGNOSIS — L02413 Cutaneous abscess of right upper limb: Secondary | ICD-10-CM | POA: Diagnosis not present

## 2024-07-02 DIAGNOSIS — R531 Weakness: Secondary | ICD-10-CM | POA: Insufficient documentation

## 2024-07-02 DIAGNOSIS — R600 Localized edema: Secondary | ICD-10-CM | POA: Diagnosis not present

## 2024-07-02 DIAGNOSIS — R29898 Other symptoms and signs involving the musculoskeletal system: Secondary | ICD-10-CM | POA: Insufficient documentation

## 2024-07-02 LAB — POCT URINE DIPSTICK
Bilirubin, UA: NEGATIVE
Blood, UA: NEGATIVE
Glucose, UA: NEGATIVE mg/dL
Ketones, POC UA: NEGATIVE mg/dL
Leukocytes, UA: NEGATIVE
Nitrite, UA: NEGATIVE
Protein Ur, POC: NEGATIVE mg/dL
Spec Grav, UA: 1.02 (ref 1.010–1.025)
Urobilinogen, UA: 0.2 U/dL
pH, UA: 6.5 (ref 5.0–8.0)

## 2024-07-02 NOTE — Telephone Encounter (Signed)
 Pt c/o swelling/edema: STAT if pt has developed SOB within 24 hours  If swelling, where is the swelling located? Both legs  How much weight have you gained and in what time span?   yes  Have you gained 2 pounds in a day or 5 pounds in a week?   Yes  Do you have a log of your daily weights (if so, list)?   no  Are you currently taking a fluid pill?   Yes  Are you currently SOB?   No  Have you traveled recently in a car or plane for an extended period of time?   Patient returned RN's call and stated she is still having swelling more in her left leg and wants advice on next steps. Patient noted her legs are not weeping anymore.

## 2024-07-02 NOTE — ED Provider Notes (Signed)
 [facility]    CSN: [id number] Arrival date & time: 07/02/24  1444      History   Chief Complaint Chief Complaint  Patient presents with   Leg Swelling   Leg Pain    HPI Katelyn [name] is a 68 y.o. female.   68 year old female here with her husband.  Patient is not able to walk on her own and uses a motorized wheelchair.  She has had recurrent swelling in her legs and weakness in her legs.  She is having a left thigh pain that at times feels like bee stings and when the pain is the worst she is short of breath due to the level of the pain she is having more trouble lifting the leg at all.  She can get some movement out of her foot but she is not able to lift her leg onto her wheelchair on her own.  She is here today with concerns about acutely worsening weakness in the left leg over the last 2 days (since 06/30/2024) and worsening edema in her lower legs.  Because of the edema she called cardiology not neurology and they encouraged her to go to an emergency room.  She did not want to go to the emergency room so she came here first to get input about whether she should go to an emergency room or what she could do.  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  Her spinal stimulator hardware was removed.  She is managed by neurosurgery and infectious disease.  She continues on suppressive antibiotics of doxycycline  and Cipro . 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      Leg Pain Associated symptoms: no back pain and no fever     Past Medical History:  Diagnosis Date   Anxiety    Arthritis    Bursitis of right hip    CAD (coronary artery disease)    Cardiac catheterization June 2014 in [place] - 50% circumflex stenosis   Chest pain, neg MI, stable CAD non obstructive on cath 10/05/20 10/04/2020    Chronic diastolic heart failure (HCC) 08/20/2017   Chronic kidney disease, stage 3 (HCC)    does not see nephrologist   Chronic low back pain without sciatica 03/14/2016   Cirrhosis of liver (HCC)    CKD (chronic kidney disease), stage III (HCC) 10/07/2020   Complication of anesthesia    Cough 04/19/2017   Overview:  Last Assessment & Plan:  Formatting of this note may be different from the original. Cough - ? ACE related with AR triggers   Plan  Patient Instructions  Discuss with your primary doctor that lisinopril  pain, need making your cough worse. May use Mucinex  DM twice daily as needed for cough and congestion Zyrtec 10 mg at bedtime as needed for drainage Saline nasal spray as needed. Lab tests today Activity as tolerated. Follow with Dr. Shellia in 3-4 months and As needed   Please contact office for sooner follow up if symptoms do not improve or worsen or seek emergency care    Depression    Dyspnea    with exertion    lazy lung - per  Dr Shellia from back issues- 06/2016   Elevated liver enzymes 12/05/2016   Essential hypertension    GERD (gastroesophageal reflux disease)  Gout 03/14/2016   Greater trochanteric bursitis of right hip 02/02/2012   H/O hiatal hernia    Heart murmur    History of blood transfusion 2016   History of esophageal stricture 10/07/2020   History of kidney stones    Hypercholesterolemia    Hypertensive heart disease with heart failure (HCC) 01/01/2017   Hypoxia 10/07/2020   Iliotibial band syndrome of right side 02/02/2012   Iron  deficiency anemia due to chronic blood loss 03/14/2016   LBBB (left bundle branch block) 01/01/2017   Left bundle branch block    Leg weakness 10/07/2020   Lumbar stenosis    Meralgia paraesthetica 12/05/2016   Mild CAD 11/24/2015   Morbid obesity (HCC) 10/07/2020   Neuropathy    OSA (obstructive sleep apnea) 05/24/2016   Overview:  Managed PULM- no CPAP   PONV (postoperative nausea and vomiting)    no N/V with patch    Restless leg syndrome    S/P lumbar laminectomy 11/26/2015   S/P lumbar spinal fusion 08/29/2016   Tinnitus 12/05/2016   Type 2 diabetes mellitus (HCC)    UTI (urinary tract infection) 10/07/2020    Patient Active Problem List   Diagnosis Date Noted   Lower extremity edema 07/04/2024   Acute on chronic diastolic CHF (congestive heart failure) (HCC) 07/04/2024   Aortic valve disorder 05/21/2024   Diastolic dysfunction 05/21/2024   Malnutrition of moderate degree (Gomez: 60% to less than 75% of standard weight) 05/21/2024   Right shoulder pain 04/10/2024   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 02/19/2024   Medication management 02/19/2024   Presence of retained hardware 02/19/2024   Neurogenic bladder 02/10/2024   Neuropathic pain 02/06/2024   Chronic pain syndrome 01/25/2024   Thoracic myelopathy 01/11/2024   Thoracic spondylosis with myelopathy 01/07/2024   Hepatic cirrhosis (HCC) 11/22/2023   ACS (acute coronary syndrome) (HCC) 04/06/2023   Acute sciatica 04/06/2023   Dyslipidemia 04/06/2023   Hypertensive heart and chronic kidney disease 04/06/2023   Hyponatremia 04/06/2023   Hypothermia 04/06/2023   Renal insufficiency 04/06/2023   SIRS (systemic inflammatory response syndrome) (HCC) 04/06/2023   Slurred speech 04/06/2023   Thrombocytopenia 04/06/2023   Ventricular tachycardia, nonsustained (HCC) 04/06/2023   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   Acute renal failure (ARF) 03/13/2023   Elevated LFTs 03/13/2023   AKI (acute kidney injury) 02/20/2023   Status post thoracic spinal fusion 02/19/2023   Discitis 02/13/2023   Malfunction of spinal cord stimulator 02/13/2023   Serratia 02/13/2023   Diabetes mellitus without complication (HCC) 02/13/2023   Osteomyelitis (HCC) 02/05/2023   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   Infection of spinal cord stimulator 12/07/2022   Lactic acidosis 12/06/2022   Hypomagnesemia 12/06/2022   Anemia of chronic disease 12/06/2022   Diskitis  12/05/2022   Status post insertion of spinal cord stimulator 11/22/2022   Acute thoracic back pain 06/22/2022   Other long term (current) drug therapy 06/22/2022   Lumbar spondylosis 06/22/2022   Thoracic spondylosis 06/22/2022   Bilateral sacroiliitis 06/22/2022   Chronic, continuous use of opioids 05/31/2022   Pain medication agreement 05/02/2022   Postlaminectomy syndrome of lumbar region 05/02/2022   Radiculopathy of thoracolumbar region 05/02/2022   Obesity (BMI 30-39.9) 05/02/2022   Impaired mobility and ADLs 05/02/2022   Mastodynia 04/13/2022   Peripheral artery disease 07/11/2021   COVID-19 virus infection 06/21/2021   Rib pain on left side 06/06/2021   Abdominal pain 06/05/2021   Diabetes 1.5, 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   GAD (generalized anxiety disorder) 11/25/2020   Anxiety and depression 11/03/2020   DM2 (diabetes mellitus, type 2) (HCC)    Restless leg syndrome    PONV (postoperative nausea and vomiting)    Neuropathy    Lumbar stenosis    Left bundle branch block    Hypercholesterolemia    Heart murmur    History of kidney stones    H/O hiatal hernia    Essential hypertension    Dyspnea    Chronic depression    Complication of anesthesia    CKD stage 3b, GFR 30-44 ml/min (HCC)    CAD (coronary artery disease)    Bursitis of right hip    Arthritis    Anxiety    Morbid obesity (HCC) 10/07/2020   Hypoxia 10/07/2020   History of esophageal stricture 10/07/2020   CKD (chronic kidney disease), stage III (HCC) 10/07/2020   Leg weakness 10/07/2020   UTI (urinary tract infection) 10/07/2020   Chronic hypoxemic respiratory failure (HCC) 10/07/2020   Pulmonary hypertension (HCC) 10/06/2020   Chest pain, neg MI, stable CAD non obstructive on cath 10/05/20 10/04/2020   Hardening of the aorta (main artery of the heart) 11/25/2019   Hypertensive heart disease with congestive heart failure and chronic kidney disease  (HCC) 11/25/2019   Shortness of breath 06/11/2019   Fatty liver 04/08/2019   Migraine 04/08/2019   History of chronic kidney disease 04/08/2019   Migraine without aura and responsive to treatment 04/08/2019   Acute non-recurrent sinusitis 10/12/2017   Chronic diastolic CHF (congestive heart failure) (HCC) 08/20/2017   Genetic testing 08/17/2017   Family history of ovarian cancer 08/17/2017   Family history of breast cancer 08/17/2017   Cough 04/19/2017   DOE (dyspnea on exertion) 04/19/2017   Type 2 diabetes mellitus with diabetic neuropathy, unspecified (HCC) 01/16/2017   Hypertensive heart disease with heart failure (HCC) 01/01/2017   Hyperlipidemia 01/01/2017   LBBB (left bundle branch block) 01/01/2017   Elevated liver enzymes 12/05/2016   Meralgia paraesthetica 12/05/2016   Tinnitus 12/05/2016   Spinal stenosis of lumbar region with neurogenic claudication 12/05/2016   Wound dehiscence 10/23/2016   S/P lumbar spinal fusion 08/29/2016   OSA (obstructive sleep apnea) 05/24/2016   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   Chronic low back pain without sciatica 03/14/2016   GERD (gastroesophageal reflux disease) 03/14/2016   Gout 03/14/2016   Iron  deficiency anemia due to chronic blood loss 03/14/2016   Type 2 diabetes mellitus without complication, with long-term current use of insulin  (HCC) 03/14/2016   Recurrent major depression in remission 12/07/2015   Restless leg 12/07/2015   Insomnia 12/07/2015   S/P lumbar laminectomy 11/26/2015   Postprocedural state 11/26/2015   Mild CAD 11/24/2015   Lumbar radiculopathy 10/15/2015   History of blood transfusion 2016   Kidney stone 04/20/2014   Stricture of esophagus 04/20/2014   Greater trochanteric bursitis of right hip 02/02/2012   Iliotibial band syndrome of right side 02/02/2012   Iliotibial band syndrome 02/02/2012   Bursitis, trochanteric 02/02/2012    Past Surgical History:  Procedure Laterality Date   ABDOMINAL  HYSTERECTOMY  1983   APPENDECTOMY  Age 64   BACK SURGERY     FIRST LUMBAR FUSION/ SURGERY APRIL 2012 AND FUSION WITH INSTRUMENTATION SEPT 2012   CARDIAC CATHETERIZATION     x 2   CARPAL TUNNEL RELEASE     bil   CHOLECYSTECTOMY  1990's   COLONOSCOPY  12/12/2017   Colonic polyp status post polypectomy.  Mild sigmoid diverticulosis. Otherwise normal colonoscopy to terminal ileum.   CYSTO EXTRACTION KIDNEY STONES     ESOPHAGOGASTRODUODENOSCOPY  12/12/2017   Small hiatal hernia. Mild gastritis. Status post esophageal dilatation.   EXCISION/RELEASE BURSA HIP  02/02/2012   Procedure: EXCISION/RELEASE BURSA HIP;  Surgeon: Tanda DELENA Heading, MD;  Location: [facility];  Service: Orthopedics;  Laterality: Right;  Right Hip Bursectomy   EYE SURGERY Bilateral    cataracts   IR THORACIC DISC ASPIRATION W/IMG GUIDE  02/14/2023   IR US  GUIDE BX ASP/DRAIN  07/04/2024   KIDNEY STONE SURGERY  2008   LAMINECTOMY WITH POSTERIOR LATERAL ARTHRODESIS LEVEL 3 N/A 01/09/2024   Procedure: Posterior lateral fusion - Thoracic Seven-Thoracic Eight - Thoracic Eight-Thoracic Nine - Thoracic Nine-Thoracic Ten, Thoracic Ten-Eleven, Thoracic Eleven-Twelve, Thoracic Twelve -Lumbar One, extension of fusion and removal of Thoracic Ten screws;  Surgeon: Joshua Alm Hamilton, MD;  Location: [facility] OR;  Service: Neurosurgery;  Laterality: N/A;  Posterior lateral fusion - Thoracic Seven-Thora   LAMINECTOMY WITH POSTERIOR LATERAL ARTHRODESIS LEVEL 4 N/A 02/19/2023   Procedure: THORACIC TEN-LUMBAR TWO INSTRUMENTED FUSION;  Surgeon: Joshua Alm RAMAN, MD;  Location: [facility] OR;  Service: Neurosurgery;  Laterality: N/A;   Left knee surgery x 2  1996   reconstruction   LUMBAR DISC SURGERY  08/2016   LUMBAR LAMINECTOMY/DECOMPRESSION MICRODISCECTOMY Right 11/26/2015   Procedure: Extraforaminal Microdiscectomy  - Lumbar two-three- right;  Surgeon: Alm RAMAN Joshua, MD;  Location: [facility];  Service: Neurosurgery;  Laterality: Right;  right    LUMBAR WOUND  DEBRIDEMENT N/A 10/25/2016   Procedure: Lumbar wound revision;  Surgeon: Alm RAMAN Joshua, MD;  Location: [facility] OR;  Service: Neurosurgery;  Laterality: N/A;  Lumbar wound revision   Right shoulder surgery  2010   spur   RIGHT/LEFT HEART CATH AND CORONARY ANGIOGRAPHY N/A 10/05/2020   Procedure: RIGHT/LEFT HEART CATH AND CORONARY ANGIOGRAPHY;  Surgeon: Swazil[place], Peter M, MD;  Location: [facility] INVASIVE CV LAB;  Service: Cardiovascular;  Laterality: N/A;   SPINAL CORD STIMULATOR REMOVAL  02/19/2023   Procedure: LUMBAR SPINAL CORD STIMULATOR REMOVAL;  Surgeon: Joshua Alm RAMAN, MD;  Location: [facility] OR;  Service: Neurosurgery;;    OB History   No obstetric history on file.      Home Medications    Prior to Admission medications   Medication Sig Start Date End Date Taking? Authorizing Provider  carvedilol  (COREG ) 6.25 MG tablet Take 1 tablet (6.25 mg total) by mouth 2 (two) times daily with a meal. 05/26/24   Carlin Delon BROCKS, NP  ciprofloxacin  (CIPRO ) 500 MG tablet Take 1 tablet (500 mg total) by mouth 2 (two) times daily. 06/30/24   Manandhar, Sabina, MD  diclofenac  Sodium (VOLTAREN ) 1 % GEL Apply 2 g topically 4 (four) times daily. Patient taking differently: Apply 2 g topically 4 (four) times daily as needed (Pain). 01/31/24   Love, Sharlet RAMAN, PA-C  doxycycline  (VIBRA -TABS) 100 MG tablet Take 1 tablet (100 mg total) by mouth 2 (two) times daily. 02/21/24   Manandhar, Sabina, MD  DULoxetine  (CYMBALTA ) 60 MG capsule Take 1 capsule (60 mg total) by mouth 2 (two) times daily. 07/03/24   Emeline Joesph BROCKS, DO  HYDROcodone -acetaminophen  (NORCO/VICODIN) 5-325 MG tablet Take 1 tablet by mouth every 6 (six) hours as needed for moderate pain (pain score 4-6).    [provider]  Lidocaine -Menthol  (NERVIVE ROLL-ON EX) Apply 1 Application topically as needed (Pain).    [provider]  melatonin 5 MG TABS Take 1 tablet (  5 mg total) by mouth at bedtime as needed. 01/31/24   Love, Sharlet RAMAN, PA-C  methocarbamol   (ROBAXIN ) 500 MG tablet Take 500 mg by mouth at bedtime.    [provider]  mirabegron  ER (MYRBETRIQ ) 25 MG TB24 tablet Take 1 tablet (25 mg total) by mouth daily for 14 days, THEN 2 tablets (50 mg total) daily for 16 days. If you do not pee for > 6 hours, stop medication and call your physician. Patient taking differently: Take 50mg  (2 tablets) by mouth once daily. 05/07/24 07/04/24  Emeline Joesph BROCKS, DO  mirtazapine  (REMERON ) 30 MG tablet Take 30 mg by mouth at bedtime.    [provider]  Multiple Vitamin (MULTIVITAMIN WITH MINERALS) TABS tablet Take 1 tablet by mouth daily.    [provider]  Multiple Vitamins-Minerals (HAIR SKIN AND NAILS FORMULA) TABS Take 1 tablet by mouth daily. 10/17/18   [provider]  NOVOLOG  RELION 100 UNIT/ML injection Inject 2-20 Units into the skin 3 (three) times daily with meals. 02/01/24   Love, Sharlet RAMAN, PA-C  oxyCODONE  (OXY IR/ROXICODONE ) 5 MG immediate release tablet Take 5 mg by mouth daily as needed for moderate pain (pain score 4-6).    [provider]  pantoprazole  (PROTONIX ) 40 MG tablet Take 1 tablet by mouth once daily 04/01/24   Monetta Redell PARAS, MD  polyethylene glycol powder (GLYCOLAX /MIRALAX ) 17 GM/SCOOP powder Take 17 g by mouth daily. Patient taking differently: Take 17 g by mouth daily as needed for moderate constipation. 01/31/24   Love, Sharlet RAMAN, PA-C  pramipexole  (MIRAPEX ) 1 MG tablet Take 1 mg by mouth at bedtime.    [provider]  pregabalin  (LYRICA ) 75 MG capsule Take 1 capsule (75 mg total) by mouth 3 (three) times daily. 01/31/24   Love, Sharlet RAMAN, PA-C  senna-docusate (SENOKOT-S) 8.6-50 MG tablet Take 2 tablets by mouth 2 (two) times daily. 01/31/24   Love, Sharlet RAMAN, PA-C  tirzepatide [facility]) 5 MG/0.5ML Pen Inject 7 mg into the skin once a week. 06/06/23   [provider]  torsemide  (DEMADEX ) 20 MG tablet Take 1 tablet by mouth twice daily 04/21/24   Carlin Delon BROCKS, NP  TRESIBA   FLEXTOUCH 200 UNIT/ML FlexTouch Pen Inject 40 Units into the skin daily with breakfast. Patient taking differently: Inject 40 Units into the skin at bedtime. 02/01/24   Love, Sharlet RAMAN, PA-C    Family History Family History  Problem Relation Age of Onset   Lung cancer Father        smoked   Hypertension Father    Stroke Father    Heart failure Mother    Bone cancer Sister    Asthma Sister     Social History Social History   Tobacco Use   Smoking status: Never   Smokeless tobacco: Never   Tobacco comments:    Prior secondhand smoke  Vaping Use   Vaping status: Never Used  Substance Use Topics   Alcohol use: Not Currently   Drug use: No     Allergies   Atorvastatin calcium , Oxycodone , Atorvastatin, Pentazocine, Cefepime , Gabapentin , and Other   Review of Systems Review of Systems  Constitutional:  Negative for chills and fever.  HENT:  Negative for ear pain and sore throat.   Eyes:  Negative for pain and visual disturbance.  Respiratory:  Negative for cough and shortness of breath.   Cardiovascular:  Positive for leg swelling. Negative for chest pain and palpitations.  Gastrointestinal:  Negative for abdominal  pain, constipation, diarrhea, nausea and vomiting.  Genitourinary:  Negative for dysuria and hematuria.  Musculoskeletal:  Positive for arthralgias and gait problem (Unable to walk, uses a motorized wheelchair). Negative for back pain.  Skin:  Negative for color change and rash.  Neurological:  Positive for weakness (In both legs but left is worse than right). Negative for seizures and syncope.  All other systems reviewed and are negative.    Physical Exam Triage Vital Signs ED Triage Vitals  Encounter Vitals Group     BP 07/02/24 1508 (!) 157/79     Girls Systolic BP Percentile --      Girls Diastolic BP Percentile --      Boys Systolic BP Percentile --      Boys Diastolic BP Percentile --      Pulse Rate 07/02/24 1508 82     Resp 07/02/24 1508 20      Temp 07/02/24 1508 98.5 F (36.9 C)     Temp Source 07/02/24 1508 Oral     SpO2 07/02/24 1508 97 %     Weight --      Height --      Head Circumference --      Peak Flow --      Pain Score 07/02/24 1503 8     Pain Loc --      Pain Education --      Exclude from Growth Chart --    No data found.  Updated Vital Signs BP (!) 157/79 (BP Location: Right Arm)   Pulse 82   Temp 98.5 F (36.9 C) (Oral)   Resp 20   SpO2 97%   Visual Acuity Right Eye Distance:   Left Eye Distance:   Bilateral Distance:    Right Eye Near:   Left Eye Near:    Bilateral Near:     Physical Exam Vitals and nursing note reviewed.  Constitutional:      General: She is not in acute distress.    Appearance: She is well-developed. She is obese. She is not ill-appearing, toxic-appearing or diaphoretic.  HENT:     Head: Normocephalic and atraumatic.     Right Ear: Hearing, tympanic membrane, ear canal and external ear normal.     Left Ear: Hearing, tympanic membrane, ear canal and external ear normal.     Nose: No congestion or rhinorrhea.     Right Sinus: No maxillary sinus tenderness or frontal sinus tenderness.     Left Sinus: No maxillary sinus tenderness or frontal sinus tenderness.     Mouth/Throat:     Lips: Pink.     Mouth: Mucous membranes are moist.     Pharynx: Uvula midline. No oropharyngeal exudate or posterior oropharyngeal erythema.     Tonsils: No tonsillar exudate.  Eyes:     Conjunctiva/sclera: Conjunctivae normal.     Pupils: Pupils are equal, round, and reactive to light.  Cardiovascular:     Rate and Rhythm: Normal rate and regular rhythm.     Pulses:          Dorsalis pedis pulses are 1+ on the right side and 1+ on the left side.       Posterior tibial pulses are 1+ on the right side and 1+ on the left side.     Heart sounds: S1 normal and S2 normal. No murmur heard. Pulmonary:     Effort: Pulmonary effort is normal. No respiratory distress.     Breath sounds: Normal breath  sounds. No  decreased breath sounds, wheezing, rhonchi or rales.  Abdominal:     General: Bowel sounds are normal.     Palpations: Abdomen is soft.     Tenderness: There is no abdominal tenderness.  Musculoskeletal:        General: No swelling.     Left shoulder: Normal.     Cervical back: Neck supple.     Right lower leg: 2+ Edema present.     Left lower leg: 2+ Edema present.     Right ankle: Swelling present.     Left ankle: Swelling present.     Right foot: Swelling present.     Left foot: Swelling present.     Comments: Good range of motion of right arm overall but she has pain with shoulder movement.  The right arm is slightly weaker than the left arm related to shoulder pain.  She cannot extend her right arm upward above the shoulder level due to pain.  Patient is able to move her left foot but she is not able to lift the left lower leg on her own or move it very much at all.  Lymphadenopathy:     Head:     Right side of head: No submental, submandibular, tonsillar, preauricular or posterior auricular adenopathy.     Left side of head: No submental, submandibular, tonsillar, preauricular or posterior auricular adenopathy.     Cervical: No cervical adenopathy.     Right cervical: No superficial cervical adenopathy.    Left cervical: No superficial cervical adenopathy.  Skin:    General: Skin is warm and dry.     Capillary Refill: Capillary refill takes less than 2 seconds.     Findings: No rash.  Neurological:     Mental Status: She is alert and oriented to person, place, and time.     Cranial Nerves: Cranial nerves 2-12 are intact.     Motor: Weakness (Left leg is weaker than the right leg.  She can move her left foot but she cannot lift her left leg.) present. No tremor, atrophy, abnormal muscle tone, seizure activity or pronator drift.     Gait: Gait is intact.     Comments: The patient has some weakness in her right shoulder that is ongoing and chronic and relates to  avascular necrosis of the right humeral head.  She has had weakness in both lower legs since she had a infection discovered in her vertebra associated with a spinal stimulator.  The stimulator was removed and she has had multiple surgeries and has ended up wheelchair-bound due to weakness in her legs since that time.  She has acute worsening weakness of the left lower leg since 06/30/2024.  Psychiatric:        Mood and Affect: Mood normal.      UC Treatments / Results  Labs (all labs ordered are listed, but only abnormal results are displayed) Labs Reviewed  URINE CULTURE - Abnormal; Notable for the following components:      Result Value   Culture   (*)    Value: <10,000 COLONIES/mL INSIGNIFICANT GROWTH Performed at [facility], [address], [place], [place] [address]    All other components within normal limits  POCT URINE DIPSTICK - Normal    EKG   Radiology   Procedures Procedures (including critical care time)  Medications Ordered in UC Medications - No data to display  Initial Impression / Assessment and Plan / UC Course  I have reviewed the  triage vital signs and the nursing notes.  Pertinent labs & imaging results that were available during my care of the patient were reviewed by me and considered in my medical decision making (see chart for details).  Plan of Care: Left thigh pain, left leg weakness, generalized weakness and urinary frequency: Patient has had intermittent and chronic urinary frequency for some time.  Urinalysis is normal.  Given symptoms we will send a culture.  Will adjust the plan of care, if needed once the culture results.  She has also had some weakness and uses a motorized wheelchair.  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  She is having new onset acute left leg pain especially in her thigh which may be referred pain from her back.  It is a  sharp tingly pain and she has had it since approximately 06/18/2024 or earlier.  She also has worsening weakness of her left leg with worsening ability to move the left leg at all on her own.  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  I do not find signs of acute stroke during the visit.  Her vital signs are stable with oxygen  saturation of 97% and she is not short of breath at this time.  I am concerned that the pain and weakness in her left leg could relate to something spinal and given her previous spinal infections, she needs to see neurology/neurosurgery as soon as possible.  Encouraged to contact Dr. Alm Glendia Molt, her neurosurgeon and get seen sooner than her planned 05/22/2024 next visit.  We discussed signs and symptoms of worsening conditions and reasons to go to an emergency room.  See discharge instructions to the patient for specifics.  I discussed that if she was wanting to absolutely rule out something neurologic she needed to go to the emergency room now and that that might not be unreasonable if she cannot see neurosurgery quickly.  Today is a Thursday.  She wants to wait and try to contact her neurosurgeon on Friday morning and see what she can get done before going to an emergency room.  I reviewed the plan of care with the patient and/or the patient's guardian.  The patient and/or guardian had time to ask questions and acknowledged that the questions were answered.  I provided instruction on symptoms or reasons to return here or to go to an ER, if symptoms/condition did not improve, worsened or if new symptoms occurred.  Final Clinical Impressions(s) / UC Diagnoses   Final diagnoses:  Left thigh pain  Left leg weakness  Weakness  Urinary frequency     Discharge Instructions      Left thigh pain, left leg weakness, generalized weakness and  urinary frequency: Patient has had intermittent and chronic urinary frequency for some time.  She has also had some weakness and uses a motorized wheelchair.  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  She is having new onset acute left leg pain especially in her thigh may be referred from her back.  It is a sharp tingly pain and she has had it since approximately 06/18/2024 or earlier.  She also has worsening weakness of her left leg with worsening ability  to move the left leg at all on her own.  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  I do not find signs of acute stroke during the visit.  I am concerned that the pain and weakness in her left leg could relate to something spinal and given her previous spinal infections, she needs to see neurology/neurosurgery as soon as possible.  Encouraged to contact Dr. Alm Glendia Molt, her neurosurgeon and get seen sooner than her planned 05/22/2024 next visit.  Contact a health care provider if: Your weakness: Doesn't get better. Gets worse. Makes it hard for you to think clearly. Stops you from doing your normal activities. You have chills or a fever. Your symptoms change or get worse. You develop sores on your feet, back, elbows, tailbone, or hips. You have trouble urinating or pain when urinating. You need more support at home. You have swelling, redness, or pain in your legs. Get help right away if: Your symptoms suddenly get worse. You have shortness of breath. You have pain or a dull ache above the level of your SCI. You have chest pain or trouble breathing. You do not feel safe at home. These symptoms may represent a serious problem that is an emergency. Do not wait to see if the symptoms will go away. Get medical help right  away. Call your local emergency services (911 in the [place] Do not drive yourself to the hospital.     ED Prescriptions   None    I have reviewed the PDMP during this encounter.   Ival Domino, FNP 07/07/24 [phone]

## 2024-07-02 NOTE — Telephone Encounter (Signed)
 Called the patient and she reported that she was having pain in her left leg and was having a hard time moving it. She was having to use her right leg to lift her left leg onto the wheelchair foot rest. She also reported that both of her legs were filled with fluid and she was having a hard time seeing her knee caps. Here legs were weeping a few days ago but now they have stopped weeping. She is also a little short of breath. Based on the patient's symptoms she reported I recommended that the patient go to the ER. Patient stated that she would go to the ER.

## 2024-07-02 NOTE — ED Triage Notes (Signed)
 Pt c/o bilateral leg edema. She states this has been a recurrent issue and has had her fluid pill increased to 2 tabs a day. Pt is also having left leg pain from her groin to her toes. Pain is described as 1000 bee stings and when the pain is the worst she feels short of breath. She feels like she is unable to lift the leg to step up but when her husband touches it to help her move it hurts so bad she screams. She called her cardiologist about these changes and was told that they called and ED and was advised to come to us  first then we can decide if she needs to go to the ED or not.

## 2024-07-02 NOTE — Discharge Instructions (Addendum)
 Left thigh pain, left leg weakness, generalized weakness and urinary frequency: Patient has had intermittent and chronic urinary frequency for some time.  She has also had some weakness and uses a motorized wheelchair.  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  She is having new onset acute left leg pain especially in her thigh may be referred from her back.  It is a sharp tingly pain and she has had it since approximately 06/18/2024 or earlier.  She also has worsening weakness of her left leg with worsening ability to move the left leg at all on her own.  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  I do not find signs of acute stroke during the visit.  I am concerned that the pain and weakness in her left leg could relate to something spinal and given her previous spinal infections, she needs to see neurology/neurosurgery as soon as possible.  Encouraged to contact Dr. Alm Glendia Molt, her neurosurgeon and get seen sooner than her planned 05/22/2024 next visit.  Contact a health care provider if: Your weakness: Doesn't get better. Gets worse. Makes it hard for you to think clearly. Stops you from doing your normal activities. You have chills or a fever. Your symptoms change or get worse. You develop sores on your feet, back, elbows, tailbone, or hips. You have trouble urinating or pain when urinating. You need more support at home. You have swelling, redness, or pain in your legs. Get help right away if: Your symptoms suddenly get worse. You have shortness of breath. You have pain or a dull ache above the level of your SCI. You have chest pain or trouble breathing. You do not feel safe at home. These symptoms may represent a serious problem that is an emergency. Do not wait  to see if the symptoms will go away. Get medical help right away. Call your local emergency services (911 in the [place] Do not drive yourself to the hospital.

## 2024-07-03 ENCOUNTER — Other Ambulatory Visit: Payer: Self-pay

## 2024-07-03 ENCOUNTER — Inpatient Hospital Stay (HOSPITAL_COMMUNITY)
Admission: EM | Admit: 2024-07-03 | Discharge: 2024-07-08 | DRG: 602 | Disposition: A | Discharge status: Home Health | Attending: Internal Medicine | Admitting: Internal Medicine

## 2024-07-03 DIAGNOSIS — E119 Type 2 diabetes mellitus without complications: Secondary | ICD-10-CM

## 2024-07-03 DIAGNOSIS — M7989 Other specified soft tissue disorders: Secondary | ICD-10-CM | POA: Diagnosis present

## 2024-07-03 DIAGNOSIS — Z789 Other specified health status: Secondary | ICD-10-CM | POA: Diagnosis present

## 2024-07-03 DIAGNOSIS — T8140XD Infection following a procedure, unspecified, subsequent encounter: Secondary | ICD-10-CM

## 2024-07-03 DIAGNOSIS — D696 Thrombocytopenia, unspecified: Secondary | ICD-10-CM | POA: Diagnosis present

## 2024-07-03 DIAGNOSIS — F419 Anxiety disorder, unspecified: Secondary | ICD-10-CM | POA: Diagnosis present

## 2024-07-03 DIAGNOSIS — Z7401 Bed confinement status: Secondary | ICD-10-CM

## 2024-07-03 DIAGNOSIS — G8220 Paraplegia, unspecified: Secondary | ICD-10-CM | POA: Diagnosis present

## 2024-07-03 DIAGNOSIS — J398 Other specified diseases of upper respiratory tract: Secondary | ICD-10-CM | POA: Diagnosis present

## 2024-07-03 DIAGNOSIS — M869 Osteomyelitis, unspecified: Secondary | ICD-10-CM | POA: Diagnosis present

## 2024-07-03 DIAGNOSIS — I5033 Acute on chronic diastolic (congestive) heart failure: Secondary | ICD-10-CM

## 2024-07-03 DIAGNOSIS — E7800 Pure hypercholesterolemia, unspecified: Secondary | ICD-10-CM | POA: Diagnosis present

## 2024-07-03 DIAGNOSIS — M4644 Discitis, unspecified, thoracic region: Secondary | ICD-10-CM | POA: Diagnosis present

## 2024-07-03 DIAGNOSIS — Z794 Long term (current) use of insulin: Secondary | ICD-10-CM

## 2024-07-03 DIAGNOSIS — E8770 Fluid overload, unspecified: Principal | ICD-10-CM

## 2024-07-03 DIAGNOSIS — E871 Hypo-osmolality and hyponatremia: Secondary | ICD-10-CM | POA: Diagnosis present

## 2024-07-03 DIAGNOSIS — Z801 Family history of malignant neoplasm of trachea, bronchus and lung: Secondary | ICD-10-CM

## 2024-07-03 DIAGNOSIS — Z6837 Body mass index (BMI) 37.0-37.9, adult: Secondary | ICD-10-CM

## 2024-07-03 DIAGNOSIS — E1140 Type 2 diabetes mellitus with diabetic neuropathy, unspecified: Secondary | ICD-10-CM | POA: Diagnosis present

## 2024-07-03 DIAGNOSIS — E669 Obesity, unspecified: Secondary | ICD-10-CM | POA: Diagnosis present

## 2024-07-03 DIAGNOSIS — M87811 Other osteonecrosis, right shoulder: Secondary | ICD-10-CM | POA: Diagnosis present

## 2024-07-03 DIAGNOSIS — R32 Unspecified urinary incontinence: Secondary | ICD-10-CM | POA: Diagnosis present

## 2024-07-03 DIAGNOSIS — R0600 Dyspnea, unspecified: Secondary | ICD-10-CM | POA: Diagnosis present

## 2024-07-03 DIAGNOSIS — Z79899 Other long term (current) drug therapy: Secondary | ICD-10-CM

## 2024-07-03 DIAGNOSIS — K7581 Nonalcoholic steatohepatitis (NASH): Secondary | ICD-10-CM | POA: Diagnosis present

## 2024-07-03 DIAGNOSIS — Z823 Family history of stroke: Secondary | ICD-10-CM

## 2024-07-03 DIAGNOSIS — Z860100 Personal history of colon polyps, unspecified: Secondary | ICD-10-CM

## 2024-07-03 DIAGNOSIS — I2510 Atherosclerotic heart disease of native coronary artery without angina pectoris: Secondary | ICD-10-CM | POA: Diagnosis present

## 2024-07-03 DIAGNOSIS — J9809 Other diseases of bronchus, not elsewhere classified: Secondary | ICD-10-CM | POA: Diagnosis present

## 2024-07-03 DIAGNOSIS — N1832 Chronic kidney disease, stage 3b: Secondary | ICD-10-CM | POA: Diagnosis present

## 2024-07-03 DIAGNOSIS — Z792 Long term (current) use of antibiotics: Secondary | ICD-10-CM

## 2024-07-03 DIAGNOSIS — G061 Intraspinal abscess and granuloma: Secondary | ICD-10-CM | POA: Diagnosis present

## 2024-07-03 DIAGNOSIS — Z90710 Acquired absence of both cervix and uterus: Secondary | ICD-10-CM

## 2024-07-03 DIAGNOSIS — G8929 Other chronic pain: Secondary | ICD-10-CM | POA: Diagnosis present

## 2024-07-03 DIAGNOSIS — E785 Hyperlipidemia, unspecified: Secondary | ICD-10-CM | POA: Diagnosis present

## 2024-07-03 DIAGNOSIS — Z981 Arthrodesis status: Secondary | ICD-10-CM

## 2024-07-03 DIAGNOSIS — G4733 Obstructive sleep apnea (adult) (pediatric): Secondary | ICD-10-CM | POA: Diagnosis present

## 2024-07-03 DIAGNOSIS — E1122 Type 2 diabetes mellitus with diabetic chronic kidney disease: Secondary | ICD-10-CM | POA: Diagnosis present

## 2024-07-03 DIAGNOSIS — Z888 Allergy status to other drugs, medicaments and biological substances status: Secondary | ICD-10-CM

## 2024-07-03 DIAGNOSIS — Z825 Family history of asthma and other chronic lower respiratory diseases: Secondary | ICD-10-CM

## 2024-07-03 DIAGNOSIS — E1169 Type 2 diabetes mellitus with other specified complication: Secondary | ICD-10-CM | POA: Diagnosis present

## 2024-07-03 DIAGNOSIS — Z66 Do not resuscitate: Secondary | ICD-10-CM | POA: Diagnosis present

## 2024-07-03 DIAGNOSIS — Z87442 Personal history of urinary calculi: Secondary | ICD-10-CM

## 2024-07-03 DIAGNOSIS — Z7985 Long-term (current) use of injectable non-insulin antidiabetic drugs: Secondary | ICD-10-CM

## 2024-07-03 DIAGNOSIS — Z7409 Other reduced mobility: Secondary | ICD-10-CM | POA: Diagnosis present

## 2024-07-03 DIAGNOSIS — Z8249 Family history of ischemic heart disease and other diseases of the circulatory system: Secondary | ICD-10-CM

## 2024-07-03 DIAGNOSIS — M25511 Pain in right shoulder: Secondary | ICD-10-CM | POA: Diagnosis present

## 2024-07-03 DIAGNOSIS — M19011 Primary osteoarthritis, right shoulder: Secondary | ICD-10-CM | POA: Diagnosis present

## 2024-07-03 DIAGNOSIS — K7460 Unspecified cirrhosis of liver: Secondary | ICD-10-CM | POA: Diagnosis present

## 2024-07-03 DIAGNOSIS — F32A Depression, unspecified: Secondary | ICD-10-CM | POA: Diagnosis present

## 2024-07-03 DIAGNOSIS — R600 Localized edema: Secondary | ICD-10-CM | POA: Diagnosis present

## 2024-07-03 DIAGNOSIS — G2581 Restless legs syndrome: Secondary | ICD-10-CM | POA: Diagnosis present

## 2024-07-03 DIAGNOSIS — I130 Hypertensive heart and chronic kidney disease with heart failure and stage 1 through stage 4 chronic kidney disease, or unspecified chronic kidney disease: Secondary | ICD-10-CM | POA: Diagnosis present

## 2024-07-03 DIAGNOSIS — D631 Anemia in chronic kidney disease: Secondary | ICD-10-CM | POA: Diagnosis present

## 2024-07-03 DIAGNOSIS — Z885 Allergy status to narcotic agent status: Secondary | ICD-10-CM

## 2024-07-03 DIAGNOSIS — L02413 Cutaneous abscess of right upper limb: Principal | ICD-10-CM | POA: Diagnosis present

## 2024-07-03 DIAGNOSIS — K2100 Gastro-esophageal reflux disease with esophagitis, without bleeding: Secondary | ICD-10-CM | POA: Diagnosis present

## 2024-07-03 DIAGNOSIS — D638 Anemia in other chronic diseases classified elsewhere: Secondary | ICD-10-CM | POA: Diagnosis present

## 2024-07-03 LAB — CBC WITH DIFFERENTIAL/PLATELET
Abs Immature Granulocytes: 0.01 K/uL (ref 0.00–0.07)
Basophils Absolute: 0.1 K/uL (ref 0.0–0.1)
Basophils Relative: 1 %
Eosinophils Absolute: 0.3 K/uL (ref 0.0–0.5)
Eosinophils Relative: 5 %
HCT: 31.8 % — ABNORMAL LOW (ref 36.0–46.0)
Hemoglobin: 10.1 g/dL — ABNORMAL LOW (ref 12.0–15.0)
Immature Granulocytes: 0 %
Lymphocytes Relative: 21 %
Lymphs Abs: 1.4 K/uL (ref 0.7–4.0)
MCH: 29.3 pg (ref 26.0–34.0)
MCHC: 31.8 g/dL (ref 30.0–36.0)
MCV: 92.2 fL (ref 80.0–100.0)
Monocytes Absolute: 0.9 K/uL (ref 0.1–1.0)
Monocytes Relative: 13 %
Neutro Abs: 4.1 K/uL (ref 1.7–7.7)
Neutrophils Relative %: 60 %
Platelets: 174 K/uL (ref 150–400)
RBC: 3.45 MIL/uL — ABNORMAL LOW (ref 3.87–5.11)
RDW: 15.9 % — ABNORMAL HIGH (ref 11.5–15.5)
WBC: 6.8 K/uL (ref 4.0–10.5)
nRBC: 0 % (ref 0.0–0.2)

## 2024-07-03 LAB — COMPREHENSIVE METABOLIC PANEL WITH GFR
ALT: 32 U/L (ref 0–44)
AST: 47 U/L — ABNORMAL HIGH (ref 15–41)
Albumin: 2.6 g/dL — ABNORMAL LOW (ref 3.5–5.0)
Alkaline Phosphatase: 269 U/L — ABNORMAL HIGH (ref 38–126)
Anion gap: 10 (ref 5–15)
BUN: 23 mg/dL (ref 8–23)
CO2: 28 mmol/L (ref 22–32)
Calcium: 8.4 mg/dL — ABNORMAL LOW (ref 8.9–10.3)
Chloride: 102 mmol/L (ref 98–111)
Creatinine, Ser: 1.06 mg/dL — ABNORMAL HIGH (ref 0.44–1.00)
GFR, Estimated: 58 mL/min — ABNORMAL LOW (ref >60)
Glucose, Bld: 144 mg/dL — ABNORMAL HIGH (ref 70–99)
Potassium: 3.8 mmol/L (ref 3.5–5.1)
Sodium: 140 mmol/L (ref 135–145)
Total Bilirubin: 0.9 mg/dL (ref 0.0–1.2)
Total Protein: 6.3 g/dL — ABNORMAL LOW (ref 6.5–8.1)

## 2024-07-03 LAB — URINE CULTURE: Culture: <10000 — AB

## 2024-07-03 MED ORDER — DULOXETINE HCL 60 MG PO CPEP: 60.0000 mg | ORAL_CAPSULE | Freq: Two times a day (BID) | ORAL | 5 refills | Status: AC

## 2024-07-03 NOTE — Telephone Encounter (Signed)
 Patient called for refill of Duloxetine  60 MG. Thank you.

## 2024-07-03 NOTE — ED Triage Notes (Signed)
 Pt coming in complaining of retaining fluid.pt reporting leg swelling bilaterally Pt reports that her doctor told her to come in due to concern for an infection. Pt reports legs were weeping but are no longer. Pt reporting shortness of breath.

## 2024-07-04 ENCOUNTER — Emergency Department (HOSPITAL_COMMUNITY)

## 2024-07-04 ENCOUNTER — Inpatient Hospital Stay (HOSPITAL_COMMUNITY)

## 2024-07-04 ENCOUNTER — Ambulatory Visit (HOSPITAL_COMMUNITY): Payer: Self-pay

## 2024-07-04 ENCOUNTER — Encounter (HOSPITAL_COMMUNITY): Payer: Self-pay

## 2024-07-04 DIAGNOSIS — R0609 Other forms of dyspnea: Secondary | ICD-10-CM | POA: Diagnosis not present

## 2024-07-04 DIAGNOSIS — G8220 Paraplegia, unspecified: Secondary | ICD-10-CM | POA: Diagnosis present

## 2024-07-04 DIAGNOSIS — I5033 Acute on chronic diastolic (congestive) heart failure: Secondary | ICD-10-CM | POA: Diagnosis present

## 2024-07-04 DIAGNOSIS — G061 Intraspinal abscess and granuloma: Secondary | ICD-10-CM

## 2024-07-04 DIAGNOSIS — R0600 Dyspnea, unspecified: Secondary | ICD-10-CM | POA: Diagnosis not present

## 2024-07-04 DIAGNOSIS — Z8619 Personal history of other infectious and parasitic diseases: Secondary | ICD-10-CM | POA: Diagnosis not present

## 2024-07-04 DIAGNOSIS — Z794 Long term (current) use of insulin: Secondary | ICD-10-CM | POA: Diagnosis not present

## 2024-07-04 DIAGNOSIS — D696 Thrombocytopenia, unspecified: Secondary | ICD-10-CM | POA: Diagnosis present

## 2024-07-04 DIAGNOSIS — L02419 Cutaneous abscess of limb, unspecified: Secondary | ICD-10-CM | POA: Diagnosis not present

## 2024-07-04 DIAGNOSIS — E871 Hypo-osmolality and hyponatremia: Secondary | ICD-10-CM | POA: Diagnosis present

## 2024-07-04 DIAGNOSIS — F32A Depression, unspecified: Secondary | ICD-10-CM | POA: Diagnosis present

## 2024-07-04 DIAGNOSIS — E1169 Type 2 diabetes mellitus with other specified complication: Secondary | ICD-10-CM | POA: Diagnosis present

## 2024-07-04 DIAGNOSIS — K7581 Nonalcoholic steatohepatitis (NASH): Secondary | ICD-10-CM | POA: Diagnosis present

## 2024-07-04 DIAGNOSIS — E1140 Type 2 diabetes mellitus with diabetic neuropathy, unspecified: Secondary | ICD-10-CM | POA: Diagnosis present

## 2024-07-04 DIAGNOSIS — T847XXS Infection and inflammatory reaction due to other internal orthopedic prosthetic devices, implants and grafts, sequela: Secondary | ICD-10-CM | POA: Diagnosis not present

## 2024-07-04 DIAGNOSIS — Z66 Do not resuscitate: Secondary | ICD-10-CM | POA: Diagnosis present

## 2024-07-04 DIAGNOSIS — J398 Other specified diseases of upper respiratory tract: Secondary | ICD-10-CM | POA: Diagnosis present

## 2024-07-04 DIAGNOSIS — D631 Anemia in chronic kidney disease: Secondary | ICD-10-CM | POA: Diagnosis present

## 2024-07-04 DIAGNOSIS — E7800 Pure hypercholesterolemia, unspecified: Secondary | ICD-10-CM | POA: Diagnosis present

## 2024-07-04 DIAGNOSIS — I130 Hypertensive heart and chronic kidney disease with heart failure and stage 1 through stage 4 chronic kidney disease, or unspecified chronic kidney disease: Secondary | ICD-10-CM | POA: Diagnosis present

## 2024-07-04 DIAGNOSIS — E1122 Type 2 diabetes mellitus with diabetic chronic kidney disease: Secondary | ICD-10-CM | POA: Diagnosis present

## 2024-07-04 DIAGNOSIS — G2581 Restless legs syndrome: Secondary | ICD-10-CM | POA: Diagnosis present

## 2024-07-04 DIAGNOSIS — M87011 Idiopathic aseptic necrosis of right shoulder: Secondary | ICD-10-CM | POA: Diagnosis not present

## 2024-07-04 DIAGNOSIS — M87811 Other osteonecrosis, right shoulder: Secondary | ICD-10-CM | POA: Diagnosis present

## 2024-07-04 DIAGNOSIS — G8929 Other chronic pain: Secondary | ICD-10-CM | POA: Diagnosis present

## 2024-07-04 DIAGNOSIS — M19011 Primary osteoarthritis, right shoulder: Secondary | ICD-10-CM | POA: Diagnosis not present

## 2024-07-04 DIAGNOSIS — M869 Osteomyelitis, unspecified: Secondary | ICD-10-CM | POA: Diagnosis present

## 2024-07-04 DIAGNOSIS — R600 Localized edema: Secondary | ICD-10-CM | POA: Diagnosis present

## 2024-07-04 DIAGNOSIS — Z6837 Body mass index (BMI) 37.0-37.9, adult: Secondary | ICD-10-CM | POA: Diagnosis not present

## 2024-07-04 DIAGNOSIS — N1832 Chronic kidney disease, stage 3b: Secondary | ICD-10-CM | POA: Diagnosis present

## 2024-07-04 DIAGNOSIS — K7460 Unspecified cirrhosis of liver: Secondary | ICD-10-CM | POA: Diagnosis present

## 2024-07-04 DIAGNOSIS — M4620 Osteomyelitis of vertebra, site unspecified: Secondary | ICD-10-CM | POA: Diagnosis not present

## 2024-07-04 DIAGNOSIS — M25511 Pain in right shoulder: Secondary | ICD-10-CM | POA: Diagnosis not present

## 2024-07-04 DIAGNOSIS — L02413 Cutaneous abscess of right upper limb: Secondary | ICD-10-CM | POA: Diagnosis present

## 2024-07-04 HISTORY — PX: IR US GUIDE BX ASP/DRAIN: IMG2392

## 2024-07-04 LAB — GLUCOSE, CAPILLARY
Glucose-Capillary: 170 mg/dL — ABNORMAL HIGH (ref 70–99)
Glucose-Capillary: 183 mg/dL — ABNORMAL HIGH (ref 70–99)

## 2024-07-04 LAB — ECHOCARDIOGRAM LIMITED
Height: 62 in
S' Lateral: 3.6 cm
Single Plane A2C EF: 42 %
Weight: 3200 [oz_av]

## 2024-07-04 LAB — CBG MONITORING, ED: Glucose-Capillary: 104 mg/dL — ABNORMAL HIGH (ref 70–99)

## 2024-07-04 LAB — BRAIN NATRIURETIC PEPTIDE: B Natriuretic Peptide: 105.2 pg/mL — ABNORMAL HIGH (ref 0.0–100.0)

## 2024-07-04 LAB — HEMOGLOBIN A1C
Hgb A1c MFr Bld: 6.4 % — ABNORMAL HIGH (ref 4.8–5.6)
Mean Plasma Glucose: 136.98 mg/dL

## 2024-07-04 LAB — SYNOVIAL CELL COUNT + DIFF, W/ CRYSTALS
Crystals, Fluid: NONE SEEN
WBC, Synovial: 8 /mm3 (ref 0–200)

## 2024-07-04 MED ORDER — SODIUM CHLORIDE 0.9% FLUSH
3.0000 mL | Freq: Two times a day (BID) | INTRAVENOUS | Status: DC
  Administered 2024-07-04 – 2024-07-08 (×8): 3 mL via INTRAVENOUS

## 2024-07-04 MED ORDER — POLYETHYLENE GLYCOL 3350 17 GM/SCOOP PO POWD
17.0000 g | Freq: Every day | ORAL | Status: DC
Start: 2024-07-04 — End: 2024-07-04
  Filled 2024-07-04: qty 119

## 2024-07-04 MED ORDER — MORPHINE SULFATE (PF) 4 MG/ML IV SOLN
4.0000 mg | Freq: Once | INTRAVENOUS | Status: AC
  Administered 2024-07-04: 4 mg via INTRAVENOUS
  Filled 2024-07-04: qty 1

## 2024-07-04 MED ORDER — DOXYCYCLINE HYCLATE 100 MG PO TABS
100.0000 mg | ORAL_TABLET | Freq: Two times a day (BID) | ORAL | Status: DC
  Administered 2024-07-04 – 2024-07-05 (×3): 100 mg via ORAL
  Filled 2024-07-04 (×3): qty 1

## 2024-07-04 MED ORDER — LIDOCAINE HCL 1 % IJ SOLN
20.0000 mL | Freq: Once | INTRAMUSCULAR | Status: AC
Start: 2024-07-04 — End: 2024-07-04
  Administered 2024-07-04: 8 mL via INTRADERMAL

## 2024-07-04 MED ORDER — DULOXETINE HCL 60 MG PO CPEP
60.0000 mg | ORAL_CAPSULE | Freq: Two times a day (BID) | ORAL | Status: DC
  Administered 2024-07-04 – 2024-07-08 (×9): 60 mg via ORAL
  Filled 2024-07-04 (×4): qty 1
  Filled 2024-07-04: qty 2
  Filled 2024-07-04 (×4): qty 1

## 2024-07-04 MED ORDER — ENOXAPARIN SODIUM 40 MG/0.4ML IJ SOSY
40.0000 mg | PREFILLED_SYRINGE | INTRAMUSCULAR | Status: DC
  Administered 2024-07-04 – 2024-07-06 (×3): 40 mg via SUBCUTANEOUS
  Filled 2024-07-04 (×3): qty 0.4

## 2024-07-04 MED ORDER — SENNOSIDES-DOCUSATE SODIUM 8.6-50 MG PO TABS
2.0000 | ORAL_TABLET | Freq: Two times a day (BID) | ORAL | Status: DC
  Administered 2024-07-04 – 2024-07-08 (×9): 2 via ORAL
  Filled 2024-07-04 (×9): qty 2

## 2024-07-04 MED ORDER — INSULIN ASPART 100 UNIT/ML IJ SOLN: 0.0000 [IU] | Freq: Three times a day (TID) | INTRAMUSCULAR | Status: DC

## 2024-07-04 MED ORDER — ADULT MULTIVITAMIN W/MINERALS CH
1.0000 | ORAL_TABLET | Freq: Every day | ORAL | Status: DC
  Administered 2024-07-04 – 2024-07-08 (×5): 1 via ORAL
  Filled 2024-07-04 (×5): qty 1

## 2024-07-04 MED ORDER — PANTOPRAZOLE SODIUM 40 MG PO TBEC
40.0000 mg | DELAYED_RELEASE_TABLET | Freq: Every day | ORAL | Status: DC
  Administered 2024-07-04 – 2024-07-08 (×5): 40 mg via ORAL
  Filled 2024-07-04 (×5): qty 1

## 2024-07-04 MED ORDER — POLYETHYLENE GLYCOL 3350 17 G PO PACK
17.0000 g | PACK | Freq: Every day | ORAL | Status: DC
  Administered 2024-07-04 – 2024-07-08 (×3): 17 g via ORAL
  Filled 2024-07-04 (×5): qty 1

## 2024-07-04 MED ORDER — FUROSEMIDE 10 MG/ML IJ SOLN
40.0000 mg | Freq: Two times a day (BID) | INTRAMUSCULAR | Status: DC
  Administered 2024-07-04 – 2024-07-05 (×2): 40 mg via INTRAVENOUS
  Filled 2024-07-04 (×2): qty 4

## 2024-07-04 MED ORDER — METHOCARBAMOL 500 MG PO TABS
500.0000 mg | ORAL_TABLET | Freq: Two times a day (BID) | ORAL | Status: DC | PRN
  Administered 2024-07-06: 500 mg via ORAL
  Filled 2024-07-04: qty 1

## 2024-07-04 MED ORDER — LIDOCAINE HCL 1 % IJ SOLN
INTRAMUSCULAR | Status: AC
  Filled 2024-07-04: qty 20

## 2024-07-04 MED ORDER — ONDANSETRON HCL 4 MG/2ML IJ SOLN
4.0000 mg | Freq: Once | INTRAMUSCULAR | Status: AC
  Administered 2024-07-04: 4 mg via INTRAVENOUS
  Filled 2024-07-04: qty 2

## 2024-07-04 MED ORDER — INSULIN ASPART 100 UNIT/ML IJ SOLN
0.0000 [IU] | Freq: Three times a day (TID) | INTRAMUSCULAR | Status: DC
  Administered 2024-07-04 (×2): 1 [IU] via SUBCUTANEOUS
  Administered 2024-07-05: 2 [IU] via SUBCUTANEOUS
  Administered 2024-07-05: 3 [IU] via SUBCUTANEOUS
  Administered 2024-07-05 – 2024-07-06 (×2): 1 [IU] via SUBCUTANEOUS
  Administered 2024-07-06 (×2): 2 [IU] via SUBCUTANEOUS
  Administered 2024-07-07 – 2024-07-08 (×5): 1 [IU] via SUBCUTANEOUS

## 2024-07-04 MED ORDER — PREGABALIN 25 MG PO CAPS
75.0000 mg | ORAL_CAPSULE | Freq: Three times a day (TID) | ORAL | Status: DC
  Administered 2024-07-04 – 2024-07-07 (×12): 75 mg via ORAL
  Administered 2024-07-08: 25 mg via ORAL
  Filled 2024-07-04 (×13): qty 3

## 2024-07-04 MED ORDER — IOHEXOL 350 MG/ML SOLN
75.0000 mL | Freq: Once | INTRAVENOUS | Status: AC | PRN
  Administered 2024-07-04: 75 mL via INTRAVENOUS

## 2024-07-04 MED ORDER — ACETAMINOPHEN 325 MG PO TABS
650.0000 mg | ORAL_TABLET | Freq: Four times a day (QID) | ORAL | Status: DC | PRN
  Administered 2024-07-04: 650 mg via ORAL
  Filled 2024-07-04: qty 2

## 2024-07-04 MED ORDER — PRAMIPEXOLE DIHYDROCHLORIDE 1 MG PO TABS
1.0000 mg | ORAL_TABLET | Freq: Every day | ORAL | Status: DC
  Administered 2024-07-04 – 2024-07-07 (×4): 1 mg via ORAL
  Filled 2024-07-04 (×5): qty 1

## 2024-07-04 MED ORDER — CIPROFLOXACIN HCL 500 MG PO TABS
500.0000 mg | ORAL_TABLET | Freq: Two times a day (BID) | ORAL | Status: DC
Start: 2024-07-04 — End: 2024-07-05
  Administered 2024-07-04 – 2024-07-05 (×3): 500 mg via ORAL
  Filled 2024-07-04 (×4): qty 1

## 2024-07-04 MED ORDER — ACETAMINOPHEN 650 MG RE SUPP: 650.0000 mg | Freq: Four times a day (QID) | RECTAL | Status: DC | PRN

## 2024-07-04 MED ORDER — MIRTAZAPINE 15 MG PO TABS
30.0000 mg | ORAL_TABLET | Freq: Every day | ORAL | Status: DC
Start: 2024-07-04 — End: 2024-07-08
  Administered 2024-07-04 – 2024-07-07 (×4): 30 mg via ORAL
  Filled 2024-07-04 (×4): qty 2

## 2024-07-04 MED ORDER — MELATONIN 5 MG PO TABS
5.0000 mg | ORAL_TABLET | Freq: Every evening | ORAL | Status: DC | PRN
  Administered 2024-07-04 – 2024-07-06 (×3): 5 mg via ORAL
  Filled 2024-07-04 (×3): qty 1

## 2024-07-04 MED ORDER — FUROSEMIDE 10 MG/ML IJ SOLN
40.0000 mg | Freq: Once | INTRAMUSCULAR | Status: AC
  Administered 2024-07-04: 40 mg via INTRAVENOUS
  Filled 2024-07-04: qty 4

## 2024-07-04 NOTE — Progress Notes (Signed)
  Echocardiogram 2D Echocardiogram has been performed.  Katelyn [name] 07/04/2024, 2:55 PM

## 2024-07-04 NOTE — Assessment & Plan Note (Signed)
 Continue Mirapex.

## 2024-07-04 NOTE — H&P (Signed)
 History and Physical    Katelyn [name]  FMW:[id number]  DOB: 11-25-1955  DOA: 07/03/2024  PCP: Ofilia Lamar CROME, MD Patient coming from: Home  Chief Complaint: SOB, LE swelling  HPI:  Ms. Katelyn [name] is a 68 yo female with extensive PMH. 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  She has undergone surgery previously with removal of hardware and remains on chronic suppressive antibiotics of doxycycline  and Cipro  (followed by ID). She has also had complaints of right shoulder pain outpatient and workup has been felt to be consistent with avascular necrosis with flattened humeral head.  However there has been some concern for potential infection but no plans for surgery during recent evaluations.  For this hospitalization her complaint has been worsening swelling in her legs and shortness of breath. On workup, BNP 105.2. CXR in the ER initially unremarkable, low lung volumes with prominent perihilar vasculature.  Given concern for volume overload and CHF exacerbation, she was treated with Lasix  in the ER and admitted for further workup.   I have personally briefly reviewed patient's old medical records in [facility] and discussed patient with the ER provider when appropriate/indicated.  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 Jennefer and Dr. Dea outpatient; briefly discussed case with them - First step is will reach out to IR to see if able to aspirate anything from the shoulder.  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  Anxiety and depression - Continue Cymbalta  and mirtazapine   CAD (coronary artery disease) - denies CP  CKD stage 3b, GFR 30-44 ml/min (HCC) - patient has history of CKD3b. Baseline creat ~ 1.3 - 1.4, eGFR~ 40-43 - remains at baseline    Restless leg syndrome - Continue Mirapex   DM2 (diabetes mellitus, type 2) (HCC) - A1c 6.4% on admission - Continue SSI and CBG monitoring  OSA (  obstructive sleep apnea) - continue CPAP    Code Status:     Code Status: Full Code  DVT Prophylaxis:   enoxaparin  (LOVENOX ) injection 40 mg Start: 07/04/24 1045   Anticipated  disposition is to: Home   History: Past Medical History:  Diagnosis Date   Anxiety    Arthritis    Bursitis of right hip    CAD (coronary artery disease)    Cardiac catheterization June 2014 in [place] - 50% circumflex stenosis   Chest pain, neg MI, stable CAD non obstructive on cath 10/05/20 10/04/2020   Chronic diastolic heart failure (HCC) 08/20/2017   Chronic kidney disease, stage 3 (HCC)    does not see nephrologist   Chronic low back pain without sciatica 03/14/2016   Cirrhosis of liver (HCC)    CKD (chronic kidney disease), stage III (HCC) 10/07/2020   Complication of anesthesia    Cough 04/19/2017   Overview:  Last Assessment & Plan:  Formatting of this note may be different from the original. Cough - ? ACE related with AR triggers   Plan  Patient Instructions  Discuss with your primary doctor that lisinopril  pain, need making your cough worse. May use Mucinex  DM twice daily as needed for cough and congestion Zyrtec 10 mg at bedtime as needed for drainage Saline nasal spray as needed. Lab tests today Activity as tolerated. Follow with Dr. Shellia in 3-4 months and As needed   Please contact office for sooner follow up if symptoms do not improve or worsen or seek emergency care    Depression    Dyspnea    with exertion    lazy lung - per  Dr Shellia from back issues- 06/2016   Elevated liver enzymes 12/05/2016   Essential hypertension    GERD (gastroesophageal reflux disease)    Gout 03/14/2016   Greater trochanteric bursitis of right hip 02/02/2012   H/O hiatal hernia    Heart murmur    History of blood transfusion 2016   History of esophageal stricture 10/07/2020   History of kidney stones    Hypercholesterolemia    Hypertensive heart disease with heart failure (HCC) 01/01/2017   Hypoxia 10/07/2020   Iliotibial band syndrome of right side 02/02/2012   Iron  deficiency anemia due to chronic blood loss 03/14/2016   LBBB (left bundle branch block) 01/01/2017   Left bundle  branch block    Leg weakness 10/07/2020   Lumbar stenosis    Meralgia paraesthetica 12/05/2016   Mild CAD 11/24/2015   Morbid obesity (HCC) 10/07/2020   Neuropathy    OSA (obstructive sleep apnea) 05/24/2016   Overview:  Managed PULM- no CPAP   PONV (postoperative nausea and vomiting)    no N/V with patch   Restless leg syndrome    S/P lumbar laminectomy 11/26/2015   S/P lumbar spinal fusion 08/29/2016   Tinnitus 12/05/2016   Type 2 diabetes mellitus (HCC)    UTI (urinary tract infection) 10/07/2020    Past Surgical History:  Procedure Laterality Date   ABDOMINAL HYSTERECTOMY  1983   APPENDECTOMY  Age 9   BACK SURGERY     FIRST LUMBAR FUSION/ SURGERY APRIL 2012 AND FUSION WITH INSTRUMENTATION SEPT 2012   CARDIAC CATHETERIZATION     x 2   CARPAL TUNNEL RELEASE     bil   CHOLECYSTECTOMY  1990's   COLONOSCOPY  12/12/2017   Colonic polyp status post polypectomy. Mild sigmoid diverticulosis. Otherwise normal colonoscopy to terminal ileum.   CYSTO EXTRACTION KIDNEY  STONES     ESOPHAGOGASTRODUODENOSCOPY  12/12/2017   Small hiatal hernia. Mild gastritis. Status post esophageal dilatation.   EXCISION/RELEASE BURSA HIP  02/02/2012   Procedure: EXCISION/RELEASE BURSA HIP;  Surgeon: Tanda DELENA Heading, MD;  Location: [facility] ORS;  Service: Orthopedics;  Laterality: Right;  Right Hip Bursectomy   EYE SURGERY Bilateral    cataracts   IR THORACIC DISC ASPIRATION W/IMG GUIDE  02/14/2023   KIDNEY STONE SURGERY  2008   LAMINECTOMY WITH POSTERIOR LATERAL ARTHRODESIS LEVEL 3 N/A 01/09/2024   Procedure: Posterior lateral fusion - Thoracic Seven-Thoracic Eight - Thoracic Eight-Thoracic Nine - Thoracic Nine-Thoracic Ten, Thoracic Ten-Eleven, Thoracic Eleven-Twelve, Thoracic Twelve -Lumbar One, extension of fusion and removal of Thoracic Ten screws;  Surgeon: Joshua Alm Hamilton, MD;  Location: [facility] OR;  Service: Neurosurgery;  Laterality: N/A;  Posterior lateral fusion - Thoracic Seven-Thora   LAMINECTOMY  WITH POSTERIOR LATERAL ARTHRODESIS LEVEL 4 N/A 02/19/2023   Procedure: THORACIC TEN-LUMBAR TWO INSTRUMENTED FUSION;  Surgeon: Joshua Alm RAMAN, MD;  Location: [facility] OR;  Service: Neurosurgery;  Laterality: N/A;   Left knee surgery x 2  1996   reconstruction   LUMBAR DISC SURGERY  08/2016   LUMBAR LAMINECTOMY/DECOMPRESSION MICRODISCECTOMY Right 11/26/2015   Procedure: Extraforaminal Microdiscectomy  - Lumbar two-three- right;  Surgeon: Alm RAMAN Joshua, MD;  Location: [facility] OR[facility];  Service: Neurosurgery;  Laterality: Right;  right    LUMBAR WOUND DEBRIDEMENT N/A 10/25/2016   Procedure: Lumbar wound revision;  Surgeon: Alm RAMAN Joshua, MD;  Location: [facility] OR;  Service: Neurosurgery;  Laterality: N/A;  Lumbar wound revision   Right shoulder surgery  2010   spur   RIGHT/LEFT HEART CATH AND CORONARY ANGIOGRAPHY N/A 10/05/2020   Procedure: RIGHT/LEFT HEART CATH AND CORONARY ANGIOGRAPHY;  Surgeon: [place], Peter M, MD;  Location: [facility] INVASIVE CV LAB;  Service: Cardiovascular;  Laterality: N/A;   SPINAL CORD STIMULATOR REMOVAL  02/19/2023   Procedure: LUMBAR SPINAL CORD STIMULATOR REMOVAL;  Surgeon: Joshua Alm RAMAN, MD;  Location: [facility] OR;  Service: Neurosurgery;;     reports that she has never smoked. She has never used smokeless tobacco. She reports that she does not currently use alcohol. She reports that she does not use drugs.  Allergies  Allergen Reactions   Atorvastatin Calcium      Other Reaction(s): Not available  atorvastatin calcium    Oxycodone  Other (See Comments)    Confusion, staring episodes  States she is not allergic.    Atorvastatin Nausea And Vomiting and Other (See Comments)    MYALGIAS   Pentazocine Other (See Comments)    headache  Other Reaction(s): Not available  pentazocine   Cefepime  Other (See Comments)    Presumed AMS/neurotoxicity in the setting of AKI  cefepime    Gabapentin  Swelling   Other Nausea Only    UNSPECIFIED Anesthesia    Family History  Problem Relation  Age of Onset   Lung cancer Father        smoked   Hypertension Father    Stroke Father    Heart failure Mother    Bone cancer Sister    Asthma Sister    Home Medications: Prior to Admission medications   Medication Sig Start Date End Date Taking? Authorizing Provider  carvedilol  (COREG ) 6.25 MG tablet Take 1 tablet (6.25 mg total) by mouth 2 (two) times daily with a meal. 05/26/24  Yes Carlin Delon BROCKS, NP  ciprofloxacin  (CIPRO ) 500 MG tablet Take 1 tablet (500 mg total) by mouth 2 (two) times daily.  06/30/24  Yes Manandhar, Sabina, MD  diclofenac  Sodium (VOLTAREN ) 1 % GEL Apply 2 g topically 4 (four) times daily. Patient taking differently: Apply 2 g topically 4 (four) times daily as needed (Pain). 01/31/24  Yes Love, Sharlet RAMAN, PA-C  doxycycline  (VIBRA -TABS) 100 MG tablet Take 1 tablet (100 mg total) by mouth 2 (two) times daily. 02/21/24  Yes Manandhar, Sabina, MD  DULoxetine  (CYMBALTA ) 60 MG capsule Take 1 capsule (60 mg total) by mouth 2 (two) times daily. 07/03/24  Yes Emeline Search C, DO  HYDROcodone -acetaminophen  (NORCO/VICODIN) 5-325 MG tablet Take 1 tablet by mouth every 6 (six) hours as needed for moderate pain (pain score 4-6).   Yes [provider]  Lidocaine -Menthol  (NERVIVE ROLL-ON EX) Apply 1 Application topically as needed (Pain).   Yes [provider]  melatonin 5 MG TABS Take 1 tablet (5 mg total) by mouth at bedtime as needed. 01/31/24  Yes Love, Sharlet RAMAN, PA-C  methocarbamol  (ROBAXIN ) 500 MG tablet Take 500 mg by mouth at bedtime.   Yes [provider]  mirabegron  ER (MYRBETRIQ ) 25 MG TB24 tablet Take 1 tablet (25 mg total) by mouth daily for 14 days, THEN 2 tablets (50 mg total) daily for 16 days. If you do not pee for > 6 hours, stop medication and call your physician. Patient taking differently: Take 50mg  (2 tablets) by mouth once daily. 05/07/24 07/04/24 Yes Emeline Search C, DO  mirtazapine  (REMERON ) 30 MG tablet Take 30 mg by mouth at bedtime.    Yes [provider]  Multiple Vitamin (MULTIVITAMIN WITH MINERALS) TABS tablet Take 1 tablet by mouth daily.   Yes [provider]  Multiple Vitamins-Minerals (HAIR SKIN AND NAILS FORMULA) TABS Take 1 tablet by mouth daily. 10/17/18  Yes [provider]  NOVOLOG  RELION 100 UNIT/ML injection Inject 2-20 Units into the skin 3 (three) times daily with meals. 02/01/24  Yes Love, Sharlet RAMAN, PA-C  oxyCODONE  (OXY IR/ROXICODONE ) 5 MG immediate release tablet Take 5 mg by mouth daily as needed for moderate pain (pain score 4-6).   Yes [provider]  pantoprazole  (PROTONIX ) 40 MG tablet Take 1 tablet by mouth once daily 04/01/24  Yes Munley, Redell PARAS, MD  polyethylene glycol powder (GLYCOLAX /MIRALAX ) 17 GM/SCOOP powder Take 17 g by mouth daily. Patient taking differently: Take 17 g by mouth daily as needed for moderate constipation. 01/31/24  Yes Love, Sharlet RAMAN, PA-C  pramipexole  (MIRAPEX ) 1 MG tablet Take 1 mg by mouth at bedtime.   Yes [provider]  pregabalin  (LYRICA ) 75 MG capsule Take 1 capsule (75 mg total) by mouth 3 (three) times daily. 01/31/24  Yes Love, Sharlet RAMAN, PA-C  senna-docusate (SENOKOT-S) 8.6-50 MG tablet Take 2 tablets by mouth 2 (two) times daily. 01/31/24  Yes Love, Sharlet RAMAN, PA-C  tirzepatide [facility]) 5 MG/0.5ML Pen Inject 7 mg into the skin once a week. 06/06/23  Yes [provider]  torsemide  (DEMADEX ) 20 MG tablet Take 1 tablet by mouth twice daily 04/21/24  Yes Carlin Delon BROCKS, NP  TRESIBA  FLEXTOUCH 200 UNIT/ML FlexTouch Pen Inject 40 Units into the skin daily with breakfast. Patient taking differently: Inject 40 Units into the skin at bedtime. 02/01/24  Yes Love, Sharlet RAMAN, PA-C    Review of Systems:  Review of Systems  Constitutional: Negative.   HENT: Negative.    Eyes: Negative.   Respiratory:  Positive for shortness of breath.   Cardiovascular:  Positive for leg swelling.  Gastrointestinal: Negative.   Genitourinary:  Negative.   Musculoskeletal: Negative.   Skin: Negative.   Neurological: Negative.   Endo/Heme/Allergies: Negative.   Psychiatric/Behavioral: Negative.      Physical Exam:  Vitals:   07/04/24 0721 07/04/24 0915 07/04/24 1100 07/04/24 1307  BP: (!) 138/55 (!) 105/52 (!) 123/47   Pulse: (!) 103 92 96   Resp: 14 11 10    Temp: 98 F (36.7 C)   97.9 F (36.6 C)  TempSrc: Oral   Oral  SpO2: 99% 99% 100%   Weight: 90.7 kg     Height: 5' 2 (1.575 m)      Physical Exam Constitutional:      General: She is not in acute distress.    Appearance: Normal appearance. She is not ill-appearing.  HENT:     Head: Normocephalic and atraumatic.     Mouth/Throat:     Mouth: Mucous membranes are moist.  Eyes:     Extraocular Movements: Extraocular movements intact.  Cardiovascular:     Rate and Rhythm: Normal rate and regular rhythm.  Pulmonary:     Effort: Pulmonary effort is normal. No respiratory distress.     Breath sounds: Normal breath sounds. No wheezing.  Abdominal:     General: Bowel sounds are normal. There is no distension.     Palpations: Abdomen is soft.     Tenderness: There is no abdominal tenderness.  Musculoskeletal:     Cervical back: Normal range of motion and neck supple.     Right lower leg: Edema (2-3+) present.     Left lower leg: Edema (2-3+) present.     Comments: Limited R shoulder ROM  Skin:    General: Skin is warm and dry.  Neurological:     General: No focal deficit present.     Mental Status: She is alert.  Psychiatric:        Mood and Affect: Mood normal.      Labs on Admission:  I have personally reviewed following labs and imaging studies Results for orders placed or performed during the hospital encounter of 07/03/24 (from the past 24 hours)  CBC with Differential     Status: Abnormal   Collection Time: 07/03/24 11:02 PM  Result Value Ref Range   WBC 6.8 4.0 - 10.5 K/uL   RBC 3.45 (L) 3.87 - 5.11 MIL/uL   Hemoglobin 10.1 (L) 12.0 - 15.0  g/dL   HCT 68.1 (L) 63.9 - 53.9 %   MCV 92.2 80.0 - 100.0 fL   MCH 29.3 26.0 - 34.0 pg   MCHC 31.8 30.0 - 36.0 g/dL   RDW 84.0 (H) 88.4 - 84.4 %   Platelets 174 150 - 400 K/uL   nRBC 0.0 0.0 - 0.2 %   Neutrophils Relative % 60 %   Neutro Abs 4.1 1.7 - 7.7 K/uL   Lymphocytes Relative 21 %   Lymphs Abs 1.4 0.7 - 4.0 K/uL   Monocytes Relative 13 %   Monocytes Absolute 0.9 0.1 - 1.0 K/uL   Eosinophils Relative 5 %   Eosinophils Absolute 0.3 0.0 - 0.5 K/uL   Basophils Relative 1 %   Basophils Absolute 0.1 0.0 - 0.1 K/uL   Immature Granulocytes 0 %   Abs Immature Granulocytes 0.01 0.00 - 0.07 K/uL  Comprehensive metabolic panel     Status: Abnormal   Collection Time: 07/03/24 11:02 PM  Result Value Ref Range   Sodium 140 135 - 145 mmol/L   Potassium 3.8 3.5 - 5.1 mmol/L   Chloride  102 98 - 111 mmol/L   CO2 28 22 - 32 mmol/L   Glucose, Bld 144 (H) 70 - 99 mg/dL   BUN 23 8 - 23 mg/dL   Creatinine, Ser 8.93 (H) 0.44 - 1.00 mg/dL   Calcium  8.4 (L) 8.9 - 10.3 mg/dL   Total Protein 6.3 (L) 6.5 - 8.1 g/dL   Albumin  2.6 (L) 3.5 - 5.0 g/dL   AST 47 (H) 15 - 41 U/L   ALT 32 0 - 44 U/L   Alkaline Phosphatase 269 (H) 38 - 126 U/L   Total Bilirubin 0.9 0.0 - 1.2 mg/dL   GFR, Estimated 58 (L) >60 mL/min   Anion gap 10 5 - 15  Hemoglobin A1c     Status: Abnormal   Collection Time: 07/03/24 11:02 PM  Result Value Ref Range   Hgb A1c MFr Bld 6.4 (H) 4.8 - 5.6 %   Mean Plasma Glucose 136.98 mg/dL  Brain natriuretic peptide     Status: Abnormal   Collection Time: 07/04/24  5:25 AM  Result Value Ref Range   B Natriuretic Peptide 105.2 (H) 0.0 - 100.0 pg/mL  CBG monitoring, ED     Status: Abnormal   Collection Time: 07/04/24  1:45 PM  Result Value Ref Range   Glucose-Capillary 104 (H) 70 - 99 mg/dL   Comment 1 Notify RN      Radiological Exams on Admission: ECHOCARDIOGRAM LIMITED Result Date: 07/04/2024    ECHOCARDIOGRAM LIMITED REPORT   Patient Name:   AMERIKA NOURSE Zenker Date of Exam:  07/04/2024 Medical Rec #:  [id number]   Height:       62.0 in Accession #:    [id number]  Weight:       200.0 lb Date of Birth:  Feb 02, 1956   BSA:          1.912 m Patient Age:    68 years    BP:           123/47 mmHg Patient Gender: F           HR:           99 bpm. Exam Location:  Inpatient Procedure: Limited Echo, Limited Color Doppler and Cardiac Doppler (Both            Spectral and Color Flow Doppler were utilized during procedure). Indications:    dyspnea  History:        Patient has prior history of Echocardiogram examinations, most                 recent 04/04/2024. CHF, CAD, chronic kidney disease,                 Arrythmias:LBBB, Signs/Symptoms:Edema and Murmur; Risk                 Factors:Dyslipidemia, Sleep Apnea and Diabetes.  Sonographer:    Tinnie Barefoot RDCS Referring Phys: [phone] Oliviah Agostini IMPRESSIONS  1. Left ventricular ejection fraction, by estimation, is 60 to 65%. The left ventricle has normal function. The left ventricle has no regional wall motion abnormalities. There is mild left ventricular hypertrophy.  2. Right ventricular systolic function is normal. The right ventricular size is normal.  3. The mitral valve is normal in structure. No evidence of mitral valve regurgitation. No evidence of mitral stenosis.  4. The aortic valve is calcified. There is mild calcification of the aortic valve. Aortic valve regurgitation is not visualized. Aortic valve sclerosis is present, with no evidence of aortic  valve stenosis.  5. The inferior vena cava is normal in size with greater than 50% respiratory variability, suggesting right atrial pressure of 3 mmHg. FINDINGS  Left Ventricle: Left ventricular ejection fraction, by estimation, is 60 to 65%. The left ventricle has normal function. The left ventricle has no regional wall motion abnormalities. The left ventricular internal cavity size was normal in size. There is  mild left ventricular hypertrophy. Right Ventricle: The right ventricular size is  normal. No increase in right ventricular wall thickness. Right ventricular systolic function is normal. Left Atrium: Left atrial size was normal in size. Right Atrium: Right atrial size was normal in size. Pericardium: There is no evidence of pericardial effusion. Mitral Valve: The mitral valve is normal in structure. No evidence of mitral valve stenosis. Tricuspid Valve: The tricuspid valve is normal in structure. Tricuspid valve regurgitation is not demonstrated. No evidence of tricuspid stenosis. Aortic Valve: The aortic valve is calcified. There is mild calcification of the aortic valve. Aortic valve regurgitation is not visualized. Aortic valve sclerosis is present, with no evidence of aortic valve stenosis. Pulmonic Valve: The pulmonic valve was normal in structure. Pulmonic valve regurgitation is not visualized. No evidence of pulmonic stenosis. Aorta: The aortic root is normal in size and structure. Venous: The inferior vena cava is normal in size with greater than 50% respiratory variability, suggesting right atrial pressure of 3 mmHg. IAS/Shunts: No atrial level shunt detected by color flow Doppler. Additional Comments: Spectral Doppler performed. Color Doppler performed.  LEFT VENTRICLE PLAX 2D LVIDd:         4.60 cm LVIDs:         3.60 cm LV PW:         1.20 cm LV IVS:        1.20 cm  LV Volumes (MOD) LV vol d, MOD A2C: 71.6 ml LV vol s, MOD A2C: 41.5 ml LV SV MOD A2C:     30.1 ml IVC IVC diam: 1.80 cm LEFT ATRIUM         Index LA diam:    4.80 cm 2.51 cm/m  AORTIC VALVE LVOT Vmax:   120.00 cm/s LVOT Vmean:  75.000 cm/s LVOT VTI:    0.192 m  SHUNTS Systemic VTI: 0.19 m Oneil Parchment MD Electronically signed by Oneil Parchment MD Signature Date/Time: 07/04/2024/3:06:25 PM    Final    CT Angio Chest Pulmonary Embolism (PE) W or WO Contrast Result Date: 07/04/2024 CLINICAL DATA:  Shortness of breath. Decreased mobility. Intermittent bilateral leg swelling prompting a recent increase in diuretic dosage. History  of discitis and right shoulder pain. EXAM: CT ANGIOGRAPHY CHEST WITH CONTRAST TECHNIQUE: Multidetector CT imaging of the chest was performed using the standard protocol during bolus administration of intravenous contrast. Multiplanar CT image reconstructions and MIPs were obtained to evaluate the vascular anatomy. RADIATION DOSE REDUCTION: This exam was performed according to the departmental dose-optimization program which includes automated exposure control, adjustment of the mA and/or kV according to patient size and/or use of iterative reconstruction technique. CONTRAST:  75mL OMNIPAQUE  IOHEXOL  350 MG/ML SOLN COMPARISON:  Chest radiographs dated 07/04/2024 and 01/17/2023. Chest CT dated 06/13/2019. Right shoulder CT dated 04/09/20251 FINDINGS: Cardiovascular: Normally opacified pulmonary arteries with no pulmonary arterial filling defects seen. Enlarged heart. No pericardial effusion. Minimal atheromatous aortic and coronary artery calcifications. Mediastinum/Nodes: There is moderate flattening of the trachea and proximal mainstem bronchi. Portions of the esophagus are filled with air and demonstrate mild-to-moderate wall thickening. Unremarkable thyroid  gland. No enlarged lymph  nodes. Lungs/Pleura: Prominent pulmonary vasculature and mild heterogeneous ground-glass opacities throughout both lungs. Mild peripheral bullous changes in the lower lobes. No airspace consolidation or pleural fluid. Upper Abdomen: Unremarkable. Musculoskeletal: Interval large, heterogeneous mass-like area engulfing the right shoulder, not included in its entirety. The included portion measures 6.1 x 5.3 cm in maximum diameter on image number 58/7, including some muscle that is indistinguishable from the mass with no separating fat plane. This is partially included in the coronal plane, measuring more than 7 cm in length. The mass-like area has intermediate and low-density components. There is interval bone destruction involving the  glenohumeral area and probable surgical absence of the humeral head and neck. Extensive thoracic and lower cervical spine degenerative changes. Lower thoracic spine fixation hardware and screw tracts and previously demonstrated erosive changes centered at the T9-10 level. Review of the MIP images confirms the above findings. IMPRESSION: 1. No pulmonary emboli. 2. Cardiomegaly and mild changes of congestive heart failure. 3. Interval large, heterogeneous mass-like area engulfing the right shoulder, not included in its entirety and measuring more than 7 cm in length. There is interval bone destruction involving the glenohumeral area and probable surgical absence of the humeral head and neck. This is concerning for a large abscess/granulomatous process and possible hematoma. Neoplasm is less likely due to the rapid progression of the findings. 4. Moderate flattening of the trachea and proximal mainstem bronchi, compatible with tracheobronchomalacia. 5. Mild-to-moderate esophageal wall thickening, suggesting esophagitis. 6. Mild changes of COPD. 7. Minimal atheromatous aortic and coronary artery atheromatous calcifications. Aortic Atherosclerosis (ICD10-I70.0) and Emphysema (ICD10-J43.9). Electronically Signed   By: Elspeth Bathe M.D.   On: 07/04/2024 10:47   VAS US  LOWER EXTREMITY VENOUS (DVT) Result Date: 07/04/2024  Lower Venous DVT Study Patient Name:  MACKENSI MAHADEO Dejaynes  Date of Exam:   07/04/2024 Medical Rec #: [id number]    Accession #:    [id number] Date of Birth: [birth year]    Patient Gender: F Patient Age:   89 years Exam Location:  [facility] Procedure:      VAS US  LOWER EXTREMITY VENOUS (DVT) Referring Phys: ALM APO --------------------------------------------------------------------------------  Indications: Edema.  Limitations: Body habitus and poor ultrasound/tissue interface. Comparison Study: Previous exam on 01/13/2024 was negative for DVT Performing Technologist: Ezzie Potters RVT, RDMS  Examination  Guidelines: A complete evaluation includes B-mode imaging, spectral Doppler, color Doppler, and power Doppler as needed of all accessible portions of each vessel. Bilateral testing is considered an integral part of a complete examination. Limited examinations for reoccurring indications may be performed as noted. The reflux portion of the exam is performed with the patient in reverse Trendelenburg.  +---------+---------------+---------+-----------+----------+-------------------+ RIGHT    CompressibilityPhasicitySpontaneityPropertiesThrombus Aging      +---------+---------------+---------+-----------+----------+-------------------+ CFV      Full           Yes      Yes                                      +---------+---------------+---------+-----------+----------+-------------------+ SFJ      Full                                                             +---------+---------------+---------+-----------+----------+-------------------+ FV Prox  Full           Yes      Yes                                      +---------+---------------+---------+-----------+----------+-------------------+ FV Mid   Full           Yes      Yes                                      +---------+---------------+---------+-----------+----------+-------------------+ FV DistalFull           Yes      Yes                                      +---------+---------------+---------+-----------+----------+-------------------+ PFV      Full                                                             +---------+---------------+---------+-----------+----------+-------------------+ POP      Full           Yes      Yes                                      +---------+---------------+---------+-----------+----------+-------------------+ PTV      Full                                         Not well visualized +---------+---------------+---------+-----------+----------+-------------------+ PERO      Full                                         Not well visualized +---------+---------------+---------+-----------+----------+-------------------+   +---------+---------------+---------+-----------+----------+-------------------+ LEFT     CompressibilityPhasicitySpontaneityPropertiesThrombus Aging      +---------+---------------+---------+-----------+----------+-------------------+ CFV      Full           Yes      Yes                                      +---------+---------------+---------+-----------+----------+-------------------+ SFJ      Full                                                             +---------+---------------+---------+-----------+----------+-------------------+ FV Prox  Full           Yes      Yes                                      +---------+---------------+---------+-----------+----------+-------------------+  FV Mid   Full           Yes      Yes                                      +---------+---------------+---------+-----------+----------+-------------------+ FV DistalFull           Yes      Yes                                      +---------+---------------+---------+-----------+----------+-------------------+ PFV      Full                                                             +---------+---------------+---------+-----------+----------+-------------------+ POP      Full           Yes      Yes                                      +---------+---------------+---------+-----------+----------+-------------------+ PTV      Full                                         Not well visualized +---------+---------------+---------+-----------+----------+-------------------+ PERO     Full                                         Not well visualized +---------+---------------+---------+-----------+----------+-------------------+     Summary: BILATERAL: - No evidence of deep vein thrombosis seen in the lower extremities,  bilaterally. -No evidence of popliteal cyst, bilaterally. -Diffuse subcutaneous edema, bilaterally.   *See table(s) above for measurements and observations. Electronically signed by Lonni Gaskins MD on 07/04/2024 at 9:50:16 AM.    Final    DG Chest 2 View Result Date: 07/04/2024 EXAM: 2 VIEW(S) XRAY OF THE CHEST 07/04/2024 04:19:17 AM COMPARISON: 08/02/2023 CLINICAL HISTORY: SHOB. FINDINGS: LUNGS AND PLEURA: Low lung volumes. Prominent perihilar vasculature likely due to vascular crowding. No focal pulmonary opacity. No pulmonary edema. No pleural effusion. No pneumothorax. HEART AND MEDIASTINUM: Thoracic neurostimulator noted. No acute abnormality of the cardiac and mediastinal silhouettes. BONES AND SOFT TISSUES: Thoracolumbar surgical hardware noted. No acute osseous abnormality. IMPRESSION: 1. No acute cardiopulmonary process. 2. Low lung volumes with prominent perihilar vasculature, likely due to vascular crowding. Electronically signed by: Waddell Calk MD 07/04/2024 05:38 AM EDT RP Workstation: [id number]   CT Angio Chest Pulmonary Embolism (PE) W or WO Contrast  Final Result    VAS US  LOWER EXTREMITY VENOUS (DVT)  Final Result    DG Chest 2 View  Final Result    IR ARTHRO ASP OR INJ MAJOR JOINT OR BURSA    (Results Pending)    Consults called:     EKG: Independently reviewed.    Alm Apo, MD [facility] 07/04/2024, 3:28 PM

## 2024-07-04 NOTE — Assessment & Plan Note (Signed)
-   Checking lower extremity duplex to rule out other etiology of lower extremity edema given her immobility

## 2024-07-04 NOTE — Assessment & Plan Note (Signed)
-   Cardiomegaly and prominent pulmonary vasculature, bilateral leg edema, dyspnea - BNP 105 -Last echo June 2025 showed EF 45 to 50%, no RWMA, grade 1 DD - Follow-up repeat echo this admission - Continue IV Lasix

## 2024-07-04 NOTE — ED Notes (Signed)
 Floor notified patient coming upstairs.

## 2024-07-04 NOTE — Assessment & Plan Note (Signed)
-   Complicates overall prognosis and care - Body mass index is 36.58 kg/m.

## 2024-07-04 NOTE — ED Provider Notes (Signed)
 [facility] AT [facility] Provider Note   CSN: [id number] Arrival date & time: 07/03/24  2109     Patient presents with: No chief complaint on file.   Katelyn [name] is a 68 y.o. female past medical history significant for diabetes, CHF, CAD, CKD, GERD, and hypertension presents today for bilateral leg swelling with intermittent weeping.  Patient also reporting shortness of breath.  Patient denies nausea, vomiting, fever, chills, numbness, weakness, or chest pain.   HPI     Prior to Admission medications   Medication Sig Start Date End Date Taking? Authorizing Provider  carvedilol  (COREG ) 6.25 MG tablet Take 1 tablet (6.25 mg total) by mouth 2 (two) times daily with a meal. 05/26/24   Carlin Delon BROCKS, NP  ciprofloxacin  (CIPRO ) 500 MG tablet Take 1 tablet (500 mg total) by mouth 2 (two) times daily. 06/30/24   Manandhar, Sabina, MD  diclofenac  Sodium (VOLTAREN ) 1 % GEL Apply 2 g topically 4 (four) times daily. 01/31/24   Love, Sharlet RAMAN, PA-C  doxycycline  (VIBRA -TABS) 100 MG tablet Take 1 tablet (100 mg total) by mouth 2 (two) times daily. 02/21/24   Manandhar, Sabina, MD  DULoxetine  (CYMBALTA ) 60 MG capsule Take 1 capsule (60 mg total) by mouth 2 (two) times daily. 07/03/24   Emeline Search C, DO  melatonin 5 MG TABS Take 1 tablet (5 mg total) by mouth at bedtime as needed. 01/31/24   Love, Sharlet RAMAN, PA-C  mirabegron  ER (MYRBETRIQ ) 25 MG TB24 tablet Take 1 tablet (25 mg total) by mouth daily for 14 days, THEN 2 tablets (50 mg total) daily for 16 days. If you do not pee for > 6 hours, stop medication and call your physician. 05/07/24 06/30/24  Emeline Search C, DO  mirtazapine  (REMERON ) 30 MG tablet Take 30 mg by mouth at bedtime.    [provider]  Multiple Vitamin (MULTIVITAMIN WITH MINERALS) TABS tablet Take 1 tablet by mouth daily.    [provider]  Multiple Vitamins-Minerals (HAIR SKIN AND NAILS FORMULA) TABS Take 1 tablet by mouth daily.  10/17/18   [provider]  NOVOLOG  RELION 100 UNIT/ML injection Inject 2-20 Units into the skin 3 (three) times daily with meals. 02/01/24   Love, Sharlet RAMAN, PA-C  pantoprazole  (PROTONIX ) 40 MG tablet Take 1 tablet by mouth once daily 04/01/24   Monetta Redell PARAS, MD  polyethylene glycol powder (GLYCOLAX /MIRALAX ) 17 GM/SCOOP powder Take 17 g by mouth daily. 01/31/24   Love, Sharlet RAMAN, PA-C  pramipexole  (MIRAPEX ) 1 MG tablet Take 1 mg by mouth at bedtime.    [provider]  pregabalin  (LYRICA ) 75 MG capsule Take 1 capsule (75 mg total) by mouth 3 (three) times daily. 01/31/24   Love, Sharlet RAMAN, PA-C  senna-docusate (SENOKOT-S) 8.6-50 MG tablet Take 2 tablets by mouth 2 (two) times daily. 01/31/24   Love, Sharlet RAMAN, PA-C  tirzepatide [facility]) 5 MG/0.5ML Pen Inject 7 mg into the skin once a week. 06/06/23   [provider]  torsemide  (DEMADEX ) 20 MG tablet Take 1 tablet by mouth twice daily 04/21/24   Woody, Jennifer C, NP  TRESIBA  FLEXTOUCH 200 UNIT/ML FlexTouch Pen Inject 40 Units into the skin daily with breakfast. 02/01/24   Love, Sharlet RAMAN, PA-C    Allergies: Atorvastatin calcium , Oxycodone , Atorvastatin, Pentazocine, Cefepime , Gabapentin , and Other    Review of Systems  Respiratory:  Positive for shortness of breath.   Cardiovascular:  Positive for leg swelling.    Updated  Vital Signs BP (!) 132/51   Pulse 94   Temp 98.2 F (36.8 C)   Resp 11   SpO2 96%   Physical Exam Vitals and nursing note reviewed.  Constitutional:      General: She is not in acute distress.    Appearance: She is well-developed. She is obese. She is not ill-appearing or diaphoretic.  HENT:     Head: Normocephalic and atraumatic.     Right Ear: External ear normal.     Left Ear: External ear normal.  Eyes:     Conjunctiva/sclera: Conjunctivae normal.  Cardiovascular:     Rate and Rhythm: Normal rate and regular rhythm.     Pulses: Normal pulses.     Heart sounds: Normal heart sounds. No  murmur heard. Pulmonary:     Effort: Pulmonary effort is normal. No respiratory distress.     Breath sounds: Normal breath sounds. No rhonchi.  Abdominal:     Palpations: Abdomen is soft.     Tenderness: There is no abdominal tenderness.  Musculoskeletal:        General: No swelling.     Cervical back: Neck supple.     Comments: Bilateral +3 pitting edema at the lower extremities.  Patient is neurovascularly intact with +2 dorsalis pedis pulses bilaterally.  No significant erythema or warmth noted to BLE.  Skin:    General: Skin is warm and dry.     Capillary Refill: Capillary refill takes less than 2 seconds.  Neurological:     General: No focal deficit present.     Mental Status: She is alert and oriented to person, place, and time.  Psychiatric:        Mood and Affect: Mood normal.     (all labs ordered are listed, but only abnormal results are displayed) Labs Reviewed  CBC WITH DIFFERENTIAL/PLATELET - Abnormal; Notable for the following components:      Result Value   RBC 3.45 (*)    Hemoglobin 10.1 (*)    HCT 31.8 (*)    RDW 15.9 (*)    All other components within normal limits  COMPREHENSIVE METABOLIC PANEL WITH GFR - Abnormal; Notable for the following components:   Glucose, Bld 144 (*)    Creatinine, Ser 1.06 (*)    Calcium  8.4 (*)    Total Protein 6.3 (*)    Albumin  2.6 (*)    AST 47 (*)    Alkaline Phosphatase 269 (*)    GFR, Estimated 58 (*)    All other components within normal limits  BRAIN NATRIURETIC PEPTIDE - Abnormal; Notable for the following components:   B Natriuretic Peptide 105.2 (*)    All other components within normal limits    EKG: EKG Interpretation Date/Time:  Friday July 04 2024 03:55:21 EDT Ventricular Rate:  83 PR Interval:  167 QRS Duration:  133 QT Interval:  426 QTC Calculation: 501 R Axis:   44  Text Interpretation: Sinus rhythm Left bundle branch block No significant change since last tracing Confirmed by Midge Golas ([id number]) on 07/04/2024 3:59:07 AM  Radiology: [facility] Chest 2 View Result Date: 07/04/2024 EXAM: 2 VIEW(S) XRAY OF THE CHEST 07/04/2024 04:19:17 AM COMPARISON: 08/02/2023 CLINICAL HISTORY: SHOB. FINDINGS: LUNGS AND PLEURA: Low lung volumes. Prominent perihilar vasculature likely due to vascular crowding. No focal pulmonary opacity. No pulmonary edema. No pleural effusion. No pneumothorax. HEART AND MEDIASTINUM: Thoracic neurostimulator noted. No acute abnormality of the cardiac and mediastinal silhouettes. BONES AND SOFT TISSUES: Thoracolumbar surgical hardware  noted. No acute osseous abnormality. IMPRESSION: 1. No acute cardiopulmonary process. 2. Low lung volumes with prominent perihilar vasculature, likely due to vascular crowding. Electronically signed by: Waddell Calk MD 07/04/2024 05:38 AM EDT RP Workstation: [id number]     Procedures   Medications Ordered in the ED  furosemide  (LASIX ) injection 40 mg (has no administration in time range)  morphine  (PF) 4 MG/ML injection 4 mg (4 mg Intravenous Given 07/04/24 0404)  ondansetron  (ZOFRAN ) injection 4 mg (4 mg Intravenous Given 07/04/24 0404)    Clinical Course as of 07/04/24 0633  Fri Jul 04, 2024  0631 Clinically fluid overloaded. In wheelchair 2/2 spinal infection. Short of breath  [CP]    Clinical Course User Index [CP] Rosan Sherlean DEL, PA-C                                 Medical Decision Making Amount and/or Complexity of Data Reviewed Labs: ordered. Radiology: ordered.  Risk Prescription drug management.   This patient presents to the ED for concern of leg swelling and shortness of breath, this involves an extensive number of treatment options, and is a complaint that carries with it a high risk of complications and morbidity.  The differential diagnosis includes CHF exacerbation, cellulitis, osteomyelitis, dependent edema   Co morbidities / Chronic conditions that complicate the patient evaluation  diabetes, CHF,  CAD, CKD, GERD, and hypertension   Additional history obtained:  Additional history obtained from EMR External records from outside source obtained and reviewed including cardiology notes   Lab Tests:  I Ordered, and personally interpreted labs.  The pertinent results include: Anemia at 10.1 which is chronic per historical values, mildly elevated creatinine at 1.06, elevated alk phos at 269 which is chronic, mildly elevated AST at 47, BNP 105.2   Imaging Studies ordered:  I ordered imaging studies including chest x-ray I independently visualized and interpreted imaging which showed low lung volumes with prominent perihilar vasculature, likely due to vascular crowding I agree with the radiologist interpretation   Cardiac Monitoring: / EKG:  The patient was maintained on a cardiac monitor.  I personally viewed and interpreted the cardiac monitored which showed an underlying rhythm of: Sinus rhythm, LBBB   Problem List / ED Course / Critical interventions / Medication management I ordered medication including Lasix  I have reviewed the patients home medicines and have made adjustments as needed  Consulted hospitalist, Dr. Sundil who is agreeable to admission     Final diagnoses:  Hypervolemia, unspecified hypervolemia type    ED Discharge Orders     None          Francis Ileana SAILOR, PA-C 07/04/24 [id number]    Midge Golas, MD 07/04/24 [phone]

## 2024-07-04 NOTE — Procedures (Signed)
  Interventional Radiology Procedure Note   Risks and benefits of joint aspiration were discussed with the patient including, but not limited to bleeding, infection, unable to obtain fluid, and damage to adjacent structures.  All of the patient's questions were answered, patient is agreeable to proceed. Consent signed and in chart.  A timeout was performed with all members of the team prior to start of the procedure. Correct patient and correct procedure was confirmed. Allergies were reviewed.   PROCEDURE SUMMARY:  Successful fluoro guided right shoulder aspiration yielding 25 mL of serosanguinous fluid. Sample was capped and sent to lab. No immediate complications.  Pt tolerated well.   EBL = none  Please see full dictation in imaging section of Epic for procedure details.  Electronically Signed: Carlin DELENA Griffon, PA-C 07/04/2024, 4:51 PM

## 2024-07-04 NOTE — Hospital Course (Addendum)
 Katelyn [name] is a 68 yo female with extensive PMH. 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  She has undergone surgery previously with removal of hardware and remains on chronic suppressive antibiotics of doxycycline  and Cipro  (followed by ID). She has also had complaints of right shoulder pain outpatient and workup has been felt to be consistent with avascular necrosis with flattened humeral head.  However there has been some concern for potential infection but no plans for surgery during recent evaluations.  For this hospitalization her complaint has been worsening swelling in her legs and shortness of breath. On workup, BNP 105.2. CXR in the ER initially unremarkable, low lung volumes with prominent perihilar vasculature.  Given concern for volume overload and CHF exacerbation, she was treated with Lasix  in the ER and admitted for further workup.

## 2024-07-04 NOTE — Assessment & Plan Note (Addendum)
 PT/OT evals

## 2024-07-04 NOTE — Assessment & Plan Note (Signed)
 Continue Cymbalta  and mirtazapine 

## 2024-07-04 NOTE — Assessment & Plan Note (Signed)
-   patient has history of CKD3b. Baseline creat ~ 1.3 - 1.4, eGFR~ 40-43 - remains at baseline

## 2024-07-04 NOTE — Assessment & Plan Note (Addendum)
-   A1c 6.4% on admission - Continue SSI and CBG monitoring

## 2024-07-04 NOTE — Assessment & Plan Note (Signed)
denies CP

## 2024-07-04 NOTE — ED Notes (Signed)
Pt being transported to echo.

## 2024-07-04 NOTE — ED Notes (Signed)
 Pt being transported to CT

## 2024-07-04 NOTE — Assessment & Plan Note (Addendum)
-   complicated history and treatment - 2017 postop infection with Serratia; then T11-12 discitis/OM with epidural phlegmon and [place] stimulator in place; then had thoracic posterior fixation and removal of spinal cord stimulator - follows with ID, recently seen 06/30/24 - remains on chronic suppressive therapy with doxycycline  and ciprofloxacin  - Follow-up in 3 months

## 2024-07-04 NOTE — Assessment & Plan Note (Signed)
-   Hgb baseline 10-11 g/dL - at baseline

## 2024-07-04 NOTE — Assessment & Plan Note (Addendum)
-   Suspecting from CHF exacerbation however obtaining CT angio chest to rule out other etiology given decreased mobility status -CTA chest negative for PE - CTA chest does show tracheobronchomalacia -Cardiomegaly and changes consistent with CHF noted

## 2024-07-04 NOTE — Assessment & Plan Note (Signed)
 --  continue CPAP

## 2024-07-04 NOTE — ED Notes (Signed)
 Patient going to floor from echo

## 2024-07-04 NOTE — Progress Notes (Signed)
 BLE venous duplex has been completed.   Results can be found under chart review under CV PROC. 07/04/2024 9:31 AM Kellsey Sansone RVT, RDMS

## 2024-07-04 NOTE — Assessment & Plan Note (Addendum)
-   Ongoing chronic problem for months felt to be consistent with avascular necrosis however at risk for infection at baseline -CT chest showing concern for heterogenous masslike area in the right shoulder measuring 7 cm in length concerning for abscess or granulomatous process versus hematoma -Interval bone destruction involving glenohumeral area and surgical absence of the humeral head and neck - follows with Dr. Jennefer and Dr. Dea outpatient; briefly discussed case with them - First step is will reach out to IR to see if able to aspirate anything from the shoulder.  Otherwise will consider formal Ortho input over the weekend but she seems to be a poor surgical candidate

## 2024-07-05 ENCOUNTER — Inpatient Hospital Stay (HOSPITAL_COMMUNITY)

## 2024-07-05 DIAGNOSIS — T847XXS Infection and inflammatory reaction due to other internal orthopedic prosthetic devices, implants and grafts, sequela: Secondary | ICD-10-CM

## 2024-07-05 DIAGNOSIS — M4620 Osteomyelitis of vertebra, site unspecified: Secondary | ICD-10-CM | POA: Diagnosis not present

## 2024-07-05 DIAGNOSIS — L02419 Cutaneous abscess of limb, unspecified: Secondary | ICD-10-CM | POA: Diagnosis not present

## 2024-07-05 DIAGNOSIS — M25511 Pain in right shoulder: Secondary | ICD-10-CM

## 2024-07-05 DIAGNOSIS — R600 Localized edema: Secondary | ICD-10-CM | POA: Diagnosis not present

## 2024-07-05 DIAGNOSIS — G8929 Other chronic pain: Secondary | ICD-10-CM

## 2024-07-05 DIAGNOSIS — R0600 Dyspnea, unspecified: Secondary | ICD-10-CM

## 2024-07-05 LAB — CBC WITH DIFFERENTIAL/PLATELET
Abs Immature Granulocytes: 0.01 K/uL (ref 0.00–0.07)
Basophils Absolute: 0.1 K/uL (ref 0.0–0.1)
Basophils Relative: 1 %
Eosinophils Absolute: 0.2 K/uL (ref 0.0–0.5)
Eosinophils Relative: 5 %
HCT: 29.2 % — ABNORMAL LOW (ref 36.0–46.0)
Hemoglobin: 9.4 g/dL — ABNORMAL LOW (ref 12.0–15.0)
Immature Granulocytes: 0 %
Lymphocytes Relative: 25 %
Lymphs Abs: 1.2 K/uL (ref 0.7–4.0)
MCH: 29.5 pg (ref 26.0–34.0)
MCHC: 32.2 g/dL (ref 30.0–36.0)
MCV: 91.5 fL (ref 80.0–100.0)
Monocytes Absolute: 0.6 K/uL (ref 0.1–1.0)
Monocytes Relative: 14 %
Neutro Abs: 2.6 K/uL (ref 1.7–7.7)
Neutrophils Relative %: 55 %
Platelets: 167 K/uL (ref 150–400)
RBC: 3.19 MIL/uL — ABNORMAL LOW (ref 3.87–5.11)
RDW: 15.7 % — ABNORMAL HIGH (ref 11.5–15.5)
WBC: 4.7 K/uL (ref 4.0–10.5)
nRBC: 0 % (ref 0.0–0.2)

## 2024-07-05 LAB — GLUCOSE, CAPILLARY
Glucose-Capillary: 131 mg/dL — ABNORMAL HIGH (ref 70–99)
Glucose-Capillary: 268 mg/dL — ABNORMAL HIGH (ref 70–99)
Glucose-Capillary: 215 mg/dL — ABNORMAL HIGH (ref 70–99)
Glucose-Capillary: 200 mg/dL — ABNORMAL HIGH (ref 70–99)

## 2024-07-05 LAB — BASIC METABOLIC PANEL WITH GFR
Anion gap: 11 (ref 5–15)
BUN: 25 mg/dL — ABNORMAL HIGH (ref 8–23)
CO2: 29 mmol/L (ref 22–32)
Calcium: 8.5 mg/dL — ABNORMAL LOW (ref 8.9–10.3)
Chloride: 98 mmol/L (ref 98–111)
Creatinine, Ser: 1.37 mg/dL — ABNORMAL HIGH (ref 0.44–1.00)
GFR, Estimated: 42 mL/min — ABNORMAL LOW (ref >60)
Glucose, Bld: 127 mg/dL — ABNORMAL HIGH (ref 70–99)
Potassium: 3.9 mmol/L (ref 3.5–5.1)
Sodium: 138 mmol/L (ref 135–145)

## 2024-07-05 LAB — HIV ANTIBODY (ROUTINE TESTING W REFLEX): HIV Screen 4th Generation wRfx: NONREACTIVE

## 2024-07-05 LAB — MAGNESIUM: Magnesium: 1.5 mg/dL — ABNORMAL LOW (ref 1.7–2.4)

## 2024-07-05 MED ORDER — INSULIN GLARGINE 100 UNIT/ML ~~LOC~~ SOLN
25.0000 [IU] | Freq: Every day | SUBCUTANEOUS | Status: DC
  Administered 2024-07-05 – 2024-07-07 (×3): 25 [IU] via SUBCUTANEOUS
  Filled 2024-07-05 (×3): qty 0.25

## 2024-07-05 MED ORDER — ORAL CARE MOUTH RINSE: 15.0000 mL | OROMUCOSAL | Status: DC | PRN

## 2024-07-05 MED ORDER — SODIUM CHLORIDE 0.9 % IV SOLN
2.0000 g | Freq: Two times a day (BID) | INTRAVENOUS | Status: DC
Start: 2024-07-05 — End: 2024-07-07
  Administered 2024-07-05 – 2024-07-07 (×4): 2 g via INTRAVENOUS
  Filled 2024-07-05 (×4): qty 12.5

## 2024-07-05 MED ORDER — DAPTOMYCIN-SODIUM CHLORIDE 500-0.9 MG/50ML-% IV SOLN
8.0000 mg/kg | Freq: Every day | INTRAVENOUS | Status: DC
  Administered 2024-07-05 – 2024-07-06 (×2): 500 mg via INTRAVENOUS
  Filled 2024-07-05 (×3): qty 50

## 2024-07-05 NOTE — Plan of Care (Signed)
  Problem: Coping: Goal: Ability to adjust to condition or change in health will improve Outcome: Progressing   Problem: Fluid Volume: Goal: Ability to maintain a balanced intake and output will improve Outcome: Progressing   Problem: Nutritional: Goal: Maintenance of adequate nutrition will improve Outcome: Progressing

## 2024-07-05 NOTE — Consult Note (Signed)
 ORTHOPAEDIC CONSULTATION  REQUESTING PHYSICIAN: Arlice Reichert, MD  PCP:  Ofilia Lamar CROME, MD  Chief Complaint: right shoulder pain.  HPI: Katelyn [name] is a 68 y.o. female presented to the ED 07/02/24 with LE swelling and edema. She has extensive PMH. She has history of thoracic vertebral infection with discitis/osteomyelitis. She has undergone surgery previously with removal of hardware and remains on chronic suppressive antibiotics of doxycycline  and Cipro  (followed by ID). She has also had complaints of right shoulder pain she has history of right shoulder surgery with [name] in 2011. She was recently seen at [facility] by [name] 04/07/24 with avascular necrosis and advanced glenohumeral arthritis. Currently with conservative treatments, she is awaiting right shoulder arthroplasty pending complete clearing of any infectious processes. Yesterday CTA of her chest showed a right shoulder mass concerning for abscess/granulomatous process and possible hematoma, and interval bone destruction and absence of humeral head and neck. IR aspirated 25 cc SS fluid for gram stain and culture. Orthopedics consulted. She reports that she has been having worsening right shoulder pain for the last 3 months. She denies any current tingling or numbness in RUE. She is right hand dominant. She denies any fever or chills, no chest pain.   Right shoulder IA fluid, gram stain negative, culture pending no growth 24 hours.  WBC count within normal limits. She has been afebrile.    Past Medical History:  Diagnosis Date   Anxiety    Arthritis    Bursitis of right hip    CAD (coronary artery disease)    Cardiac catheterization June 2014 in [place] - 50% circumflex stenosis   Chest pain, neg MI, stable CAD non obstructive on cath 10/05/20 10/04/2020   Chronic diastolic heart failure (HCC) 08/20/2017   Chronic kidney disease, stage 3 (HCC)    does not see nephrologist   Chronic low back pain without  sciatica 03/14/2016   Cirrhosis of liver (HCC)    CKD (chronic kidney disease), stage III (HCC) 10/07/2020   Complication of anesthesia    Cough 04/19/2017   Overview:  Last Assessment & Plan:  Formatting of this note may be different from the original. Cough - ? ACE related with AR triggers   Plan  Patient Instructions  Discuss with your primary doctor that lisinopril  pain, need making your cough worse. May use Mucinex  DM twice daily as needed for cough and congestion Zyrtec 10 mg at bedtime as needed for drainage Saline nasal spray as needed. Lab tests today Activity as tolerated. Follow with Dr. Shellia in 3-4 months and As needed   Please contact office for sooner follow up if symptoms do not improve or worsen or seek emergency care    Depression    Dyspnea    with exertion    lazy lung - per  Dr Shellia from back issues- 06/2016   Elevated liver enzymes 12/05/2016   Essential hypertension    GERD (gastroesophageal reflux disease)    Gout 03/14/2016   Greater trochanteric bursitis of right hip 02/02/2012   H/O hiatal hernia    Heart murmur    History of blood transfusion 2016   History of esophageal stricture 10/07/2020   History of kidney stones    Hypercholesterolemia    Hypertensive heart disease with heart failure (HCC) 01/01/2017   Hypoxia 10/07/2020   Iliotibial band syndrome of right side 02/02/2012   Iron  deficiency anemia due to chronic blood loss 03/14/2016   LBBB (left bundle branch block)  01/01/2017   Left bundle branch block    Leg weakness 10/07/2020   Lumbar stenosis    Meralgia paraesthetica 12/05/2016   Mild CAD 11/24/2015   Morbid obesity (HCC) 10/07/2020   Neuropathy    OSA (obstructive sleep apnea) 05/24/2016   Overview:  Managed PULM- no CPAP   PONV (postoperative nausea and vomiting)    no N/V with patch   Restless leg syndrome    S/P lumbar laminectomy 11/26/2015   S/P lumbar spinal fusion 08/29/2016   Tinnitus 12/05/2016   Type 2 diabetes mellitus  (HCC)    UTI (urinary tract infection) 10/07/2020   Past Surgical History:  Procedure Laterality Date   ABDOMINAL HYSTERECTOMY  1983   APPENDECTOMY  Age 51   BACK SURGERY     FIRST LUMBAR FUSION/ SURGERY APRIL 2012 AND FUSION WITH INSTRUMENTATION SEPT 2012   CARDIAC CATHETERIZATION     x 2   CARPAL TUNNEL RELEASE     bil   CHOLECYSTECTOMY  1990's   COLONOSCOPY  12/12/2017   Colonic polyp status post polypectomy. Mild sigmoid diverticulosis. Otherwise normal colonoscopy to terminal ileum.   CYSTO EXTRACTION KIDNEY STONES     ESOPHAGOGASTRODUODENOSCOPY  12/12/2017   Small hiatal hernia. Mild gastritis. Status post esophageal dilatation.   EXCISION/RELEASE BURSA HIP  02/02/2012   Procedure: EXCISION/RELEASE BURSA HIP;  Surgeon: Tanda DELENA Heading, MD;  Location: [facility]S;  Service: Orthopedics;  Laterality: Right;  Right Hip Bursectomy   EYE SURGERY Bilateral    cataracts   IR THORACIC DISC ASPIRATION W/IMG GUIDE  02/14/2023   IR US  GUIDE BX ASP/DRAIN  07/04/2024   KIDNEY STONE SURGERY  2008   LAMINECTOMY WITH POSTERIOR LATERAL ARTHRODESIS LEVEL 3 N/A 01/09/2024   Procedure: Posterior lateral fusion - Thoracic Seven-Thoracic Eight - Thoracic Eight-Thoracic Nine - Thoracic Nine-Thoracic Ten, Thoracic Ten-Eleven, Thoracic Eleven-Twelve, Thoracic Twelve -Lumbar One, extension of fusion and removal of Thoracic Ten screws;  Surgeon: Joshua Alm Hamilton, MD;  Location: [facility] OR;  Service: Neurosurgery;  Laterality: N/A;  Posterior lateral fusion - Thoracic Seven-Thora   LAMINECTOMY WITH POSTERIOR LATERAL ARTHRODESIS LEVEL 4 N/A 02/19/2023   Procedure: THORACIC TEN-LUMBAR TWO INSTRUMENTED FUSION;  Surgeon: Joshua Alm RAMAN, MD;  Location: [facility] OR;  Service: Neurosurgery;  Laterality: N/A;   Left knee surgery x 2  1996   reconstruction   LUMBAR DISC SURGERY  08/2016   LUMBAR LAMINECTOMY/DECOMPRESSION MICRODISCECTOMY Right 11/26/2015   Procedure: Extraforaminal Microdiscectomy  - Lumbar two-three- right;   Surgeon: Alm RAMAN Joshua, MD;  Location: [facility];  Service: Neurosurgery;  Laterality: Right;  right    LUMBAR WOUND DEBRIDEMENT N/A 10/25/2016   Procedure: Lumbar wound revision;  Surgeon: Alm RAMAN Joshua, MD;  Location: [facility] OR;  Service: Neurosurgery;  Laterality: N/A;  Lumbar wound revision   Right shoulder surgery  2010   spur   RIGHT/LEFT HEART CATH AND CORONARY ANGIOGRAPHY N/A 10/05/2020   Procedure: RIGHT/LEFT HEART CATH AND CORONARY ANGIOGRAPHY;  Surgeon: Swaziland, Peter M, MD;  Location: [facility] INVASIVE CV LAB;  Service: Cardiovascular;  Laterality: N/A;   SPINAL CORD STIMULATOR REMOVAL  02/19/2023   Procedure: LUMBAR SPINAL CORD STIMULATOR REMOVAL;  Surgeon: Joshua Alm RAMAN, MD;  Location: [facility] OR;  Service: Neurosurgery;;   Social History   Socioeconomic History   Marital status: Married    Spouse name: Not on file   Number of children: 0   Years of education: Not on file   Highest education level: Not on file  Occupational  History   Occupation: Librarian, academic  Tobacco Use   Smoking status: Never   Smokeless tobacco: Never   Tobacco comments:    Prior secondhand smoke  Vaping Use   Vaping status: Never Used  Substance and Sexual Activity   Alcohol use: Not Currently   Drug use: No   Sexual activity: Not on file  Other Topics Concern   Not on file  Social History Narrative   1 son passed in 52   Social Drivers of Health   Financial Resource Strain: Not on file  Food Insecurity: No Food Insecurity (07/04/2024)   Hunger Vital Sign    Worried About Running Out of Food in the Last Year: Never true    Ran Out of Food in the Last Year: Never true  Transportation Needs: No Transportation Needs (07/04/2024)   PRAPARE - Administrator, Civil Service (Medical): No    Lack of Transportation (Non-Medical): No  Physical Activity: Not on file  Stress: Not on file  Social Connections: Socially Integrated (07/04/2024)   Social Connection and Isolation Panel     Frequency of Communication with Friends and Family: More than three times a week    Frequency of Social Gatherings with Friends and Family: More than three times a week    Attends Religious Services: More than 4 times per year    Active Member of [employer] or Organizations: Yes    Attends Engineer, structural: More than 4 times per year    Marital Status: Married   Family History  Problem Relation Age of Onset   Lung cancer Father        smoked   Hypertension Father    Stroke Father    Heart failure Mother    Bone cancer Sister    Asthma Sister    Allergies  Allergen Reactions   Atorvastatin Calcium      Other Reaction(s): Not available  atorvastatin calcium    Oxycodone  Other (See Comments)    Confusion, staring episodes  States she is not allergic.    Atorvastatin Nausea And Vomiting and Other (See Comments)    MYALGIAS   Pentazocine Other (See Comments)    headache  Other Reaction(s): Not available  pentazocine   Cefepime  Other (See Comments)    Presumed AMS/neurotoxicity in the setting of AKI  cefepime    Gabapentin  Swelling   Other Nausea Only    UNSPECIFIED Anesthesia   Prior to Admission medications   Medication Sig Start Date End Date Taking? Authorizing Provider  carvedilol  (COREG ) 6.25 MG tablet Take 1 tablet (6.25 mg total) by mouth 2 (two) times daily with a meal. 05/26/24  Yes Carlin Delon BROCKS, NP  ciprofloxacin  (CIPRO ) 500 MG tablet Take 1 tablet (500 mg total) by mouth 2 (two) times daily. 06/30/24  Yes Manandhar, Sabina, MD  diclofenac  Sodium (VOLTAREN ) 1 % GEL Apply 2 g topically 4 (four) times daily. Patient taking differently: Apply 2 g topically 4 (four) times daily as needed (Pain). 01/31/24  Yes Love, Sharlet RAMAN, PA-C  doxycycline  (VIBRA -TABS) 100 MG tablet Take 1 tablet (100 mg total) by mouth 2 (two) times daily. 02/21/24  Yes Manandhar, Sabina, MD  DULoxetine  (CYMBALTA ) 60 MG capsule Take 1 capsule (60 mg total) by mouth 2 (two) times daily.  07/03/24  Yes Emeline Search C, DO  HYDROcodone -acetaminophen  (NORCO/VICODIN) 5-325 MG tablet Take 1 tablet by mouth every 6 (six) hours as needed for moderate pain (pain score 4-6).   Yes [provider]  Lidocaine -Menthol  (NERVIVE ROLL-ON EX) Apply 1 Application topically as needed (Pain).   Yes [provider]  melatonin 5 MG TABS Take 1 tablet (5 mg total) by mouth at bedtime as needed. 01/31/24  Yes Love, Sharlet RAMAN, PA-C  methocarbamol  (ROBAXIN ) 500 MG tablet Take 500 mg by mouth at bedtime.   Yes [provider]  mirabegron  ER (MYRBETRIQ ) 25 MG TB24 tablet Take 1 tablet (25 mg total) by mouth daily for 14 days, THEN 2 tablets (50 mg total) daily for 16 days. If you do not pee for > 6 hours, stop medication and call your physician. Patient taking differently: Take 50mg  (2 tablets) by mouth once daily. 05/07/24 07/04/24 Yes Emeline Search C, DO  mirtazapine  (REMERON ) 30 MG tablet Take 30 mg by mouth at bedtime.   Yes [provider]  Multiple Vitamin (MULTIVITAMIN WITH MINERALS) TABS tablet Take 1 tablet by mouth daily.   Yes [provider]  Multiple Vitamins-Minerals (HAIR SKIN AND NAILS FORMULA) TABS Take 1 tablet by mouth daily. 10/17/18  Yes [provider]  NOVOLOG  RELION 100 UNIT/ML injection Inject 2-20 Units into the skin 3 (three) times daily with meals. 02/01/24  Yes Love, Sharlet RAMAN, PA-C  oxyCODONE  (OXY IR/ROXICODONE ) 5 MG immediate release tablet Take 5 mg by mouth daily as needed for moderate pain (pain score 4-6).   Yes [provider]  pantoprazole  (PROTONIX ) 40 MG tablet Take 1 tablet by mouth once daily 04/01/24  Yes Munley, Redell PARAS, MD  polyethylene glycol powder (GLYCOLAX /MIRALAX ) 17 GM/SCOOP powder Take 17 g by mouth daily. Patient taking differently: Take 17 g by mouth daily as needed for moderate constipation. 01/31/24  Yes Love, Sharlet RAMAN, PA-C  pramipexole  (MIRAPEX ) 1 MG tablet Take 1 mg by mouth at bedtime.   Yes  [provider]  pregabalin  (LYRICA ) 75 MG capsule Take 1 capsule (75 mg total) by mouth 3 (three) times daily. 01/31/24  Yes Love, Sharlet RAMAN, PA-C  senna-docusate (SENOKOT-S) 8.6-50 MG tablet Take 2 tablets by mouth 2 (two) times daily. 01/31/24  Yes Love, Sharlet RAMAN, PA-C  tirzepatide [facility]) 5 MG/0.5ML Pen Inject 7 mg into the skin once a week. 06/06/23  Yes [provider]  torsemide  (DEMADEX ) 20 MG tablet Take 1 tablet by mouth twice daily 04/21/24  Yes Carlin Delon BROCKS, NP  TRESIBA  FLEXTOUCH 200 UNIT/ML FlexTouch Pen Inject 40 Units into the skin daily with breakfast. Patient taking differently: Inject 40 Units into the skin at bedtime. 02/01/24  Yes Love, Sharlet RAMAN, PA-C   IR ARTHRO ASP OR INJ MAJOR JOINT OR BURSA Result Date: 07/04/2024 INDICATION: 68 year old female with right shoulder pain, concern for avascular necrosis of right humeral head and neck, with possible fluid collection at site of the shoulder joint. IR was requested for possible aspiration. EXAM: ARTHROCENTESIS/INJECTION OF LARGE JOINT COMPARISON:  CTA chest 07/04/2024. CONTRAST:  None. FLUOROSCOPY: Radiology Exposure Index: 7.9 MGy Kerma COMPLICATIONS: None immediate PROCEDURE: Informed written consent was obtained from the patient after discussion of the risks, benefits and alternatives to treatment. The patient was placed supine on the fluoroscopy table. Upon review of patient's recent chest CT, it was noted that the largest fluid pocket over the right glenohumeral joint was immediately distal to the visualized humeral head. The area was localized with fluoroscopy. The skin overlying the anterior aspect of the right shoulder was prepped and draped in usual sterile fashion. Approximately 15 mL of 1% lidocaine  was used to anesthetize the skin. A 20  gauge spinal needle was advanced into the previously identified potential fluid collection medial and posterior to the to the current edge of the right humeral neck remnant  as seen on AP imaging, and an image was saved for documentation purposes. Approximally 25 mL of serosanguineous fluid was aspirated. The syringes were capped and sent to the laboratory for analysis as ordered by the clinical team. The needle was removed and a dressing was placed. The patient tolerated procedure well without immediate postprocedural complication. IMPRESSION: Successful fluoroscopic guided aspiration of the right shoulder joint. Procedure performed by Carlin Griffon, PA-C Electronically Signed   By: Toribio Agreste M.D.   On: 07/04/2024 16:50   ECHOCARDIOGRAM LIMITED Result Date: 07/04/2024    ECHOCARDIOGRAM LIMITED REPORT   Patient Name:   MARICELLA FILYAW Poppell Date of Exam: 07/04/2024 Medical Rec #:  [id number]   Height:       62.0 in Accession #:    [id number]  Weight:       200.0 lb Date of Birth:  03-18-1956   BSA:          1.912 m Patient Age:    68 years    BP:           123/47 mmHg Patient Gender: F           HR:           99 bpm. Exam Location:  Inpatient Procedure: Limited Echo, Limited Color Doppler and Cardiac Doppler (Both            Spectral and Color Flow Doppler were utilized during procedure). Indications:    dyspnea  History:        Patient has prior history of Echocardiogram examinations, most                 recent 04/04/2024. CHF, CAD, chronic kidney disease,                 Arrythmias:LBBB, Signs/Symptoms:Edema and Murmur; Risk                 Factors:Dyslipidemia, Sleep Apnea and Diabetes.  Sonographer:    Tinnie Barefoot RDCS Referring Phys: [phone] DAVID GIRGUIS IMPRESSIONS  1. Left ventricular ejection fraction, by estimation, is 60 to 65%. The left ventricle has normal function. The left ventricle has no regional wall motion abnormalities. There is mild left ventricular hypertrophy.  2. Right ventricular systolic function is normal. The right ventricular size is normal.  3. The mitral valve is normal in structure. No evidence of mitral valve regurgitation. No evidence of mitral stenosis.   4. The aortic valve is calcified. There is mild calcification of the aortic valve. Aortic valve regurgitation is not visualized. Aortic valve sclerosis is present, with no evidence of aortic valve stenosis.  5. The inferior vena cava is normal in size with greater than 50% respiratory variability, suggesting right atrial pressure of 3 mmHg. FINDINGS  Left Ventricle: Left ventricular ejection fraction, by estimation, is 60 to 65%. The left ventricle has normal function. The left ventricle has no regional wall motion abnormalities. The left ventricular internal cavity size was normal in size. There is  mild left ventricular hypertrophy. Right Ventricle: The right ventricular size is normal. No increase in right ventricular wall thickness. Right ventricular systolic function is normal. Left Atrium: Left atrial size was normal in size. Right Atrium: Right atrial size was normal in size. Pericardium: There is no evidence of pericardial effusion. Mitral Valve: The mitral valve is normal in  structure. No evidence of mitral valve stenosis. Tricuspid Valve: The tricuspid valve is normal in structure. Tricuspid valve regurgitation is not demonstrated. No evidence of tricuspid stenosis. Aortic Valve: The aortic valve is calcified. There is mild calcification of the aortic valve. Aortic valve regurgitation is not visualized. Aortic valve sclerosis is present, with no evidence of aortic valve stenosis. Pulmonic Valve: The pulmonic valve was normal in structure. Pulmonic valve regurgitation is not visualized. No evidence of pulmonic stenosis. Aorta: The aortic root is normal in size and structure. Venous: The inferior vena cava is normal in size with greater than 50% respiratory variability, suggesting right atrial pressure of 3 mmHg. IAS/Shunts: No atrial level shunt detected by color flow Doppler. Additional Comments: Spectral Doppler performed. Color Doppler performed.  LEFT VENTRICLE PLAX 2D LVIDd:         4.60 cm LVIDs:          3.60 cm LV PW:         1.20 cm LV IVS:        1.20 cm  LV Volumes (MOD) LV vol d, MOD A2C: 71.6 ml LV vol s, MOD A2C: 41.5 ml LV SV MOD A2C:     30.1 ml IVC IVC diam: 1.80 cm LEFT ATRIUM         Index LA diam:    4.80 cm 2.51 cm/m  AORTIC VALVE LVOT Vmax:   120.00 cm/s LVOT Vmean:  75.000 cm/s LVOT VTI:    0.192 m  SHUNTS Systemic VTI: 0.19 m Oneil Parchment MD Electronically signed by Oneil Parchment MD Signature Date/Time: 07/04/2024/3:06:25 PM    Final    CT Angio Chest Pulmonary Embolism (PE) W or WO Contrast Result Date: 07/04/2024 CLINICAL DATA:  Shortness of breath. Decreased mobility. Intermittent bilateral leg swelling prompting a recent increase in diuretic dosage. History of discitis and right shoulder pain. EXAM: CT ANGIOGRAPHY CHEST WITH CONTRAST TECHNIQUE: Multidetector CT imaging of the chest was performed using the standard protocol during bolus administration of intravenous contrast. Multiplanar CT image reconstructions and MIPs were obtained to evaluate the vascular anatomy. RADIATION DOSE REDUCTION: This exam was performed according to the departmental dose-optimization program which includes automated exposure control, adjustment of the mA and/or kV according to patient size and/or use of iterative reconstruction technique. CONTRAST:  75mL OMNIPAQUE  IOHEXOL  350 MG/ML SOLN COMPARISON:  Chest radiographs dated 07/04/2024 and 01/17/2023. Chest CT dated 06/13/2019. Right shoulder CT dated 04/09/20251 FINDINGS: Cardiovascular: Normally opacified pulmonary arteries with no pulmonary arterial filling defects seen. Enlarged heart. No pericardial effusion. Minimal atheromatous aortic and coronary artery calcifications. Mediastinum/Nodes: There is moderate flattening of the trachea and proximal mainstem bronchi. Portions of the esophagus are filled with air and demonstrate mild-to-moderate wall thickening. Unremarkable thyroid  gland. No enlarged lymph nodes. Lungs/Pleura: Prominent pulmonary vasculature and  mild heterogeneous ground-glass opacities throughout both lungs. Mild peripheral bullous changes in the lower lobes. No airspace consolidation or pleural fluid. Upper Abdomen: Unremarkable. Musculoskeletal: Interval large, heterogeneous mass-like area engulfing the right shoulder, not included in its entirety. The included portion measures 6.1 x 5.3 cm in maximum diameter on image number 58/7, including some muscle that is indistinguishable from the mass with no separating fat plane. This is partially included in the coronal plane, measuring more than 7 cm in length. The mass-like area has intermediate and low-density components. There is interval bone destruction involving the glenohumeral area and probable surgical absence of the humeral head and neck. Extensive thoracic and lower cervical spine degenerative changes. Lower thoracic  spine fixation hardware and screw tracts and previously demonstrated erosive changes centered at the T9-10 level. Review of the MIP images confirms the above findings. IMPRESSION: 1. No pulmonary emboli. 2. Cardiomegaly and mild changes of congestive heart failure. 3. Interval large, heterogeneous mass-like area engulfing the right shoulder, not included in its entirety and measuring more than 7 cm in length. There is interval bone destruction involving the glenohumeral area and probable surgical absence of the humeral head and neck. This is concerning for a large abscess/granulomatous process and possible hematoma. Neoplasm is less likely due to the rapid progression of the findings. 4. Moderate flattening of the trachea and proximal mainstem bronchi, compatible with tracheobronchomalacia. 5. Mild-to-moderate esophageal wall thickening, suggesting esophagitis. 6. Mild changes of COPD. 7. Minimal atheromatous aortic and coronary artery atheromatous calcifications. Aortic Atherosclerosis (ICD10-I70.0) and Emphysema (ICD10-J43.9). Electronically Signed   By: Elspeth Bathe M.D.   On:  07/04/2024 10:47   VAS US  LOWER EXTREMITY VENOUS (DVT) Result Date: 07/04/2024  Lower Venous DVT Study Patient Name:  SHAVONNA CORELLA Hodkinson  Date of Exam:   07/04/2024 Medical Rec #: [id number]    Accession #:    [id number] Date of Birth: 03-04-1956    Patient Gender: F Patient Age:   32 years Exam Location:  [facility] Procedure:      VAS US  LOWER EXTREMITY VENOUS (DVT) Referring Phys: ALM APO --------------------------------------------------------------------------------  Indications: Edema.  Limitations: Body habitus and poor ultrasound/tissue interface. Comparison Study: Previous exam on 01/13/2024 was negative for DVT Performing Technologist: Ezzie Potters RVT, RDMS  Examination Guidelines: A complete evaluation includes B-mode imaging, spectral Doppler, color Doppler, and power Doppler as needed of all accessible portions of each vessel. Bilateral testing is considered an integral part of a complete examination. Limited examinations for reoccurring indications may be performed as noted. The reflux portion of the exam is performed with the patient in reverse Trendelenburg.  +---------+---------------+---------+-----------+----------+-------------------+ RIGHT    CompressibilityPhasicitySpontaneityPropertiesThrombus Aging      +---------+---------------+---------+-----------+----------+-------------------+ CFV      Full           Yes      Yes                                      +---------+---------------+---------+-----------+----------+-------------------+ SFJ      Full                                                             +---------+---------------+---------+-----------+----------+-------------------+ FV Prox  Full           Yes      Yes                                      +---------+---------------+---------+-----------+----------+-------------------+ FV Mid   Full           Yes      Yes                                       +---------+---------------+---------+-----------+----------+-------------------+ FV DistalFull  Yes      Yes                                      +---------+---------------+---------+-----------+----------+-------------------+ PFV      Full                                                             +---------+---------------+---------+-----------+----------+-------------------+ POP      Full           Yes      Yes                                      +---------+---------------+---------+-----------+----------+-------------------+ PTV      Full                                         Not well visualized +---------+---------------+---------+-----------+----------+-------------------+ PERO     Full                                         Not well visualized +---------+---------------+---------+-----------+----------+-------------------+   +---------+---------------+---------+-----------+----------+-------------------+ LEFT     CompressibilityPhasicitySpontaneityPropertiesThrombus Aging      +---------+---------------+---------+-----------+----------+-------------------+ CFV      Full           Yes      Yes                                      +---------+---------------+---------+-----------+----------+-------------------+ SFJ      Full                                                             +---------+---------------+---------+-----------+----------+-------------------+ FV Prox  Full           Yes      Yes                                      +---------+---------------+---------+-----------+----------+-------------------+ FV Mid   Full           Yes      Yes                                      +---------+---------------+---------+-----------+----------+-------------------+ FV DistalFull           Yes      Yes                                      +---------+---------------+---------+-----------+----------+-------------------+  PFV  Full                                                             +---------+---------------+---------+-----------+----------+-------------------+ POP      Full           Yes      Yes                                      +---------+---------------+---------+-----------+----------+-------------------+ PTV      Full                                         Not well visualized +---------+---------------+---------+-----------+----------+-------------------+ PERO     Full                                         Not well visualized +---------+---------------+---------+-----------+----------+-------------------+     Summary: BILATERAL: - No evidence of deep vein thrombosis seen in the lower extremities, bilaterally. -No evidence of popliteal cyst, bilaterally. -Diffuse subcutaneous edema, bilaterally.   *See table(s) above for measurements and observations. Electronically signed by Lonni Gaskins MD on 07/04/2024 at 9:50:16 AM.    Final    DG Chest 2 View Result Date: 07/04/2024 EXAM: 2 VIEW(S) XRAY OF THE CHEST 07/04/2024 04:19:17 AM COMPARISON: 08/02/2023 CLINICAL HISTORY: SHOB. FINDINGS: LUNGS AND PLEURA: Low lung volumes. Prominent perihilar vasculature likely due to vascular crowding. No focal pulmonary opacity. No pulmonary edema. No pleural effusion. No pneumothorax. HEART AND MEDIASTINUM: Thoracic neurostimulator noted. No acute abnormality of the cardiac and mediastinal silhouettes. BONES AND SOFT TISSUES: Thoracolumbar surgical hardware noted. No acute osseous abnormality. IMPRESSION: 1. No acute cardiopulmonary process. 2. Low lung volumes with prominent perihilar vasculature, likely due to vascular crowding. Electronically signed by: Waddell Calk MD 07/04/2024 05:38 AM EDT RP Workstation: [id number]    Positive ROS: All other systems have been reviewed and were otherwise negative with the exception of those mentioned in the HPI and as above.  Physical  Exam: General: Alert, no acute distress Cardiovascular: No pedal edema Respiratory: No cyanosis, no use of accessory musculature GI: No organomegaly, abdomen is soft and non-tender Skin: No lesions in the area of chief complaint Neurologic: Sensation intact distally Psychiatric: Patient is competent for consent with normal mood and affect Lymphatic: No axillary or cervical lymphadenopathy  MUSCULOSKELETAL:  Examination of the right shoulder reveals small bruise from aspiration. No other skin wounds or lesions. No warmth or erythema. Swelling noted. Pain with palpation of proximal shoulder. She has pain with shoulder motion active and passive. ROM limited due to pain. Good ROM at elbow. No pain with palpation at the elbow.  Motor function intact in UE including fingers and wrist. Sensation intact in UE to light touch. Distal radial and ulnar pulses 2+ bilaterally. Capillary refill <2 seconds.    Assessment: Chronic right shoulder pain. Avascular necrosis right shoulder.   Plan: I discussed the findings with the patient and her family. Synovial fluid right shoulder No crystals seen. Gram stain negative, no growth 24 hours. She is  afebrile, WBC count within normal limits. She has been on suppressive oral doxycycline  and Cipro  (followed by ID), they have switched to IV daptomycin  and cefepime  after consult today. She has progression of her AVN with loss of her humeral head seen on CXR. Right shoulder x-ray ordered and pending. Recommend continued conservative treatment of her right shoulder as no bacteria from synovial fluid and low number of white cells.    Valery GORMAN Potters, PA-C    07/05/2024 7:58 PM

## 2024-07-05 NOTE — Consult Note (Addendum)
 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, known to our id service since 11/2022 followed for culture negative t11-t12 om/epidural phlegmon with spinal cord stimulator 11/2022, on suppressive cipro /doxy, worsening mri finding t11-12 s/p thoracic laminectomy and posterior fixation t12-L2 and removal spinal stimulator, discharged on dapto/cefepime  --> ertapenem /dapto for ams --> maintained on doxy/cipro , subsequent thoracic revision surgery 01/2024 for acute paraparesis (this time one of two culture grew p-acnes) restarted on ceftriaxone /cipro  through 02/20/24, and transitioned to suppressive therapy with doxy/cipro , chronic right shoulder pain, admitted 9/25 for dyspnea/legs swelling, with chest cta showing right shoulder abscess/om   9/26 ir aspirate of right shoulder culture in progress. No crystal  Ortho consulted  This could probably be a new hematogenous staph aureus process vs related to back spine infection (less likely as previously the only culture was p-acnes on 01/2024 and in 2017 initial spine infection with serratia)   Would cover with daptomycin /cefepime  for now pending workup    Plan: I have ordered bcx for today although abx already started Change abx to daptomycin /cefepime  Maintain standard isolation precaution Discussed with primary team      ------------------------------------------------ Principal Problem:   Lower extremity edema Active Problems:   Hyperlipidemia   OSA (obstructive sleep apnea)   DM2 (diabetes mellitus, type 2) (HCC)   Restless leg syndrome   Dyspnea   CKD stage 3b, GFR 30-44 ml/min (HCC)   CAD (coronary artery disease)    Anxiety and depression   Obesity (BMI 30-39.9)   Anemia of chronic disease   Impaired mobility and ADLs   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 female  liver cirrhosis, hx serratia marcescens infection in 2017, known to our id service since 11/2022 followed for culture negative t11-t12 om/epidural phlegmon with spinal cord stimulator 11/2022, on suppressive cipro /doxy, worsening mri finding t11-12 s/p thoracic laminectomy and posterior fixation t12-L2 and removal spinal stimulator, discharged on dapto/cefepime  --> ertapenem /dapto for ams --> maintained on doxy/cipro , subsequent thoracic revision surgery 01/2024 for acute paraparesis (this time one of two culture grew p-acnes) restarted on ceftriaxone /cipro  through 02/20/24, and transitioned to suppressive therapy with doxy/cipro , chronic right shoulder pain, admitted 9/25  She was last seen in office 06/30/24 compliant with doxy/cipro . Had reported chronic right shoulder pain/possible AVN since at least 04/2024, and was also followed by ortho. This is in setting of intermittent systemic prednisone use  She came in 9/25 with chronic right shoulder pain and worsening swelling in legs/dyspnea  Afebrile No leukocytosis Bnp 105 Chest cta no pe but mentioned right shoulder abscess/om Ortho and IR called Ir aspirated --> no crystal; cx in progress  No other complaint  Family History  Problem Relation Age of Onset   Lung cancer Father        smoked   Hypertension Father    Stroke Father    Heart failure Mother    Bone cancer Sister    Asthma Sister     Social History  Tobacco Use   Smoking status: Never   Smokeless tobacco: Never   Tobacco comments:    Prior secondhand smoke  Vaping Use   Vaping status: Never Used  Substance Use Topics   Alcohol use: Not Currently   Drug use: No    Allergies  Allergen  Reactions   Atorvastatin Calcium      Other Reaction(s): Not available  atorvastatin calcium    Oxycodone  Other (See Comments)    Confusion, staring episodes  States she is not allergic.    Atorvastatin Nausea And Vomiting and Other (See Comments)    MYALGIAS   Pentazocine Other (See Comments)    headache  Other Reaction(s): Not available  pentazocine   Cefepime  Other (See Comments)    Presumed AMS/neurotoxicity in the setting of AKI  cefepime    Gabapentin  Swelling   Other Nausea Only    UNSPECIFIED Anesthesia    Review of Systems: ROS All Other ROS was negative, except mentioned above   Past Medical History:  Diagnosis Date   Anxiety    Arthritis    Bursitis of right hip    CAD (coronary artery disease)    Cardiac catheterization June 2014 in [place] - 50% circumflex stenosis   Chest pain, neg MI, stable CAD non obstructive on cath 10/05/20 10/04/2020   Chronic diastolic heart failure (HCC) 08/20/2017   Chronic kidney disease, stage 3 (HCC)    does not see nephrologist   Chronic low back pain without sciatica 03/14/2016   Cirrhosis of liver (HCC)    CKD (chronic kidney disease), stage III (HCC) 10/07/2020   Complication of anesthesia    Cough 04/19/2017   Overview:  Last Assessment & Plan:  Formatting of this note may be different from the original. Cough - ? ACE related with AR triggers   Plan  Patient Instructions  Discuss with your primary doctor that lisinopril  pain, need making your cough worse. May use Mucinex  DM twice daily as needed for cough and congestion Zyrtec 10 mg at bedtime as needed for drainage Saline nasal spray as needed. Lab tests today Activity as tolerated. Follow with Dr. Shellia in 3-4 months and As needed   Please contact office for sooner follow up if symptoms do not improve or worsen or seek emergency care    Depression    Dyspnea    with exertion    lazy lung - per  Dr Shellia from back issues- 06/2016   Elevated liver enzymes 12/05/2016    Essential hypertension    GERD (gastroesophageal reflux disease)    Gout 03/14/2016   Greater trochanteric bursitis of right hip 02/02/2012   H/O hiatal hernia    Heart murmur    History of blood transfusion 2016   History of esophageal stricture 10/07/2020   History of kidney stones    Hypercholesterolemia    Hypertensive heart disease with heart failure (HCC) 01/01/2017   Hypoxia 10/07/2020   Iliotibial band syndrome of right side 02/02/2012   Iron  deficiency anemia due to chronic blood loss 03/14/2016   LBBB (left bundle branch block) 01/01/2017   Left bundle branch block    Leg weakness 10/07/2020   Lumbar stenosis    Meralgia paraesthetica 12/05/2016   Mild CAD 11/24/2015   Morbid obesity (HCC) 10/07/2020   Neuropathy    OSA (obstructive sleep apnea) 05/24/2016   Overview:  Managed PULM- no CPAP   PONV (postoperative nausea and vomiting)    no N/V with patch   Restless leg syndrome  S/P lumbar laminectomy 11/26/2015   S/P lumbar spinal fusion 08/29/2016   Tinnitus 12/05/2016   Type 2 diabetes mellitus (HCC)    UTI (urinary tract infection) 10/07/2020       Scheduled Meds:  ciprofloxacin   500 mg Oral BID   doxycycline   100 mg Oral BID   DULoxetine   60 mg Oral BID   enoxaparin  (LOVENOX ) injection  40 mg Subcutaneous Q24H   insulin  aspart  0-6 Units Subcutaneous TID AC & HS   insulin  glargine  25 Units Subcutaneous Q0600   mirtazapine   30 mg Oral QHS   multivitamin with minerals  1 tablet Oral Daily   pantoprazole   40 mg Oral Daily   polyethylene glycol  17 g Oral Daily   pramipexole   1 mg Oral QHS   pregabalin   75 mg Oral TID   senna-docusate  2 tablet Oral BID   sodium chloride  flush  3 mL Intravenous Q12H   Continuous Infusions: PRN Meds:.acetaminophen  **OR** acetaminophen , melatonin, methocarbamol    OBJECTIVE: Blood pressure 138/85, pulse 96, temperature 98 F (36.7 C), temperature source Oral, resp. rate 18, height 5' 2 (1.575 m), weight 94 kg,  SpO2 98%.  Physical Exam  General/constitutional: no distress, pleasant HEENT: Normocephalic, PER, Conj Clear, EOMI, Oropharynx clear Neck supple CV: rrr no mrg Lungs: clear to auscultation, normal respiratory effort Abd: Soft, Nontender Ext: bilateral lower ext 2+ pitting edema Skin: No Rash Neuro: nonfocal MSK: right shoulder tender and limited rom active/passive; no obvious fluctuance felt although patient said she does feels it being more mushy     Lab Results Lab Results  Component Value Date   WBC 4.7 07/05/2024   HGB 9.4 (L) 07/05/2024   HCT 29.2 (L) 07/05/2024   MCV 91.5 07/05/2024   PLT 167 07/05/2024    Lab Results  Component Value Date   CREATININE 1.37 (H) 07/05/2024   BUN 25 (H) 07/05/2024   NA 138 07/05/2024   K 3.9 07/05/2024   CL 98 07/05/2024   CO2 29 07/05/2024    Lab Results  Component Value Date   ALT 32 07/03/2024   AST 47 (H) 07/03/2024   GGT 207 (H) 01/16/2024   ALKPHOS 269 (H) 07/03/2024   BILITOT 0.9 07/03/2024      Microbiology: Recent Results (from the past 240 hours)  Urine Culture     Status: Abnormal   Collection Time: 07/02/24  4:17 PM   Specimen: Urine, Clean Catch  Result Value Ref Range Status   Specimen Description URINE, CLEAN CATCH  Final   Special Requests NONE  Final   Culture (A)  Final    <10,000 COLONIES/mL INSIGNIFICANT GROWTH Performed at [facility], [address] [place], [place] [address]    Report Status 07/03/2024 FINAL  Final  Body fluid culture w Gram Stain     Status: None (Preliminary result)   Collection Time: 07/04/24  4:22 PM   Specimen: SHOULDER; Body Fluid  Result Value Ref Range Status   Specimen Description SHOULDER  Final   Special Requests NONE  Final   Gram Stain NO WBC SEEN NO ORGANISMS SEEN   Final   Culture   Final    NO GROWTH < 24 HOURS Performed at [facility], [address] [place], [place] [address]    Report Status PENDING  Incomplete      Serology:    Imaging: If present, new imagings (plain films, ct scans, and mri) have been personally visualized and interpreted;  radiology reports have been reviewed. Decision making incorporated into the Impression / Recommendations.  9/26 ct angio chest 1. No pulmonary emboli. 2. Cardiomegaly and mild changes of congestive heart failure. 3. Interval large, heterogeneous mass-like area engulfing the right shoulder, not included in its entirety and measuring more than 7 cm in length. There is interval bone destruction involving the glenohumeral area and probable surgical absence of the humeral head and neck. This is concerning for a large abscess/granulomatous process and possible hematoma. Neoplasm is less likely due to the rapid progression of the findings. 4. Moderate flattening of the trachea and proximal mainstem bronchi, compatible with tracheobronchomalacia. 5. Mild-to-moderate esophageal wall thickening, suggesting esophagitis. 6. Mild changes of COPD. 7. Minimal atheromatous aortic and coronary artery atheromatous calcifications.  Constance ONEIDA Passer, MD [facility] : [facility] [phone] pager    07/05/2024, 3:24 PM

## 2024-07-05 NOTE — Progress Notes (Signed)
 Pharmacy Antibiotic Note  Katelyn [name] is a 68 y.o. female admitted on 07/03/2024 with osteomyelitis.  Pharmacy has been consulted for daptomycin  and cefepime  dosing.  Plan: Daptomycin  500mg  (8mg /kg of AdjBW) IV q24h - ck weekly on Sundays Cefepime  2g IV q8h Monitor renal function closely Follow up cultures as available, clinical progress, length of tx   Height: 5' 2 (157.5 cm) Weight: 94 kg (207 lb 3.7 oz) IBW/kg (Calculated) : 50.1  Temp (24hrs), Avg:98.2 F (36.8 C), Min:98 F (36.7 C), Max:98.5 F (36.9 C)  Recent Labs  Lab 07/03/24 2302 07/05/24 0233  WBC 6.8 4.7  CREATININE 1.06* 1.37*    Estimated Creatinine Clearance: 42.6 mL/min (A) (by C-G formula based on SCr of 1.37 mg/dL (H)).    Allergies  Allergen Reactions   Atorvastatin Calcium      Other Reaction(s): Not available  atorvastatin calcium    Oxycodone  Other (See Comments)    Confusion, staring episodes  States she is not allergic.    Atorvastatin Nausea And Vomiting and Other (See Comments)    MYALGIAS   Pentazocine Other (See Comments)    headache  Other Reaction(s): Not available  pentazocine   Cefepime  Other (See Comments)    Presumed AMS/neurotoxicity in the setting of AKI  cefepime    Gabapentin  Swelling   Other Nausea Only    UNSPECIFIED Anesthesia    Antimicrobials this admission: Cipro /Doxy 9/26 >> 9/27 Dapto/Cefepime  9/27 >>  Dose adjustments this admission:  Microbiology results: 9/26 Shoulder fluid: 9/27 BCx:  Thank you for allowing pharmacy to be a part of this patient's care.  Rocky Slade, PharmD, BCPS 07/05/2024 4:01 PM

## 2024-07-05 NOTE — Progress Notes (Signed)
 PROGRESS NOTE  Katelyn [name]  DOB: May 30, 1956  PCP: Ofilia Lamar CROME, MD FMW:[id number]  DOA: 07/03/2024  LOS: 1 day  Hospital Day: 3  Brief narrative: Katelyn [name] is a 68 y.o. 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 Bedbound for last few months since the surgery. She has also had complaints of right shoulder pain outpatient and workup has been felt to be consistent with avascular necrosis with flattened humeral head.  However there has been some concern for potential infection but no plans for surgery during recent evaluations.  9/25, patient presented to ED with complaint of progressively worsening bilateral leg swelling, shortness of breath  In the ED, hemodynamically stable CT angio chest did not show pulm embolism but showed a large, heterogeneous mass-like area 7 cm in length engulfing the right shoulder, with interval bone destruction involving the glenohumeral area and probable surgical absence of the humeral head and neck, findings concerning for a large abscess/granulomatous process and possible hematoma.   Admitted to [facility] 9/26, successfully located right shoulder aspiration by IR.  Obtained 25 mL of serosanguineous fluid, sent to lab.  Subjective: Patient was seen and examined this a.m. Lying on bed.  Not in distress. Husband at bedside.  Had a long conversation.  History reviewed as above. Chart reviewed Afebrile, hemodynamically stable. Labs this morning with WBC 4.7, hemoglobin 9.4, BUN/creatinine 25/1.37  Assessment and plan: Right shoulder abscess CT findings as above 9/26, successfully located right shoulder aspiration by IR.  Obtained 25 mL of serosanguineous fluid, sent for culture. I  have called Dr. Noris/PA Jaclyn for orthopedic consult.  Patient is to follow-up with [name] as an outpatient. Currently continued on chronic oral Cipro  and doxycycline  ID consult requested as well.  Defer to ID for any need of broadening antibiotic coverage. Pain regimen --- Scheduled:  --- PRN: Tylenol   h/o thoracic vertebral discitis/osteomyelitis  s/p surgical removal of hardware currently  on chronic suppressive antibiotics of doxycycline  and Cipro  (followed by ID).  Suspect CHF exacerbation Presented with dyspnea, lower extremity edema BNP mildly elevated Most recent June 2025 showed EF 45 to 50%, no RWMA, grade 1 DD Follow-up repeat echo this admission Currently on IV Lasix  40 mg twice daily.  Creatinine elevated this morning.  I would hold off Lasix . PTA meds- Coreg  6.25 mg twice daily, torsemide  20 mg daily Continue to monitor for daily intake output, weight, blood pressure, BNP, renal function and electrolytes. Net IO Since Admission: -2,260 mL [07/05/24 1455] Recent Labs  Lab 07/03/24 2302 07/04/24 0525 07/05/24 0233  BNP  --  105.2*  --   BUN 23  --  25*  CREATININE 1.06*  --  1.37*  NA 140  --  138  K 3.8  --  3.9  MG  --   --  1.5*   H/o CAD Not on any antiplatelet or statin??  Probably because of liver cirrhosis  NASH cirrhosis Has chronic bilateral lower extremity edema probably worsened due to bedbound status.  Ultrasound duplex negative for DVT No evidence of ascites currently. Diuretics plan as above Recent Labs  Lab 07/03/24 2302 07/05/24 0233  AST 47*  --   ALT 32  --   ALKPHOS 269*  --   BILITOT 0.9  --   PROT 6.3*  --  ALBUMIN  2.6*  --   PLT 174 167   Type 2 diabetes mellitus A1c 6.4 on 07/03/2024 PTA meds-Tresiba  40 units daily, Mounjaro weekly, Premeal NovoLog  3 times daily Currently on SSI/Accu-Cheks only.  Blood sugar level uptrending.  Add Lantus  20 units daily at this time Recent Labs  Lab 07/04/24 1345 07/04/24 1641  07/04/24 2154 07/05/24 0607 07/05/24 1128  GLUCAP 104* 170* 183* 131* 268*   CKD 3B Creatinine baseline  Recent Labs    01/18/24 0347 01/19/24 0348 01/21/24 0411 01/25/24 0400 01/28/24 0512 03/17/24 1405 04/10/24 0903 05/21/24 1213 07/03/24 2302 07/05/24 0233  BUN 22 24* 26* 29* 25* 34* 24 24 23  25*  CREATININE 1.26* 1.34* 1.34* 1.45* 1.37* 1.18* 1.08* 1.06* 1.06* 1.37*  CO2 25 26 27 28 28  32 33* 27 28 29    Tracheomalacia Noted on CT chest.  Not sure if it has any role on her current symptoms Follows up with pulmonologist Dr. Shellia as an outpatient  Mild chronic anemia Esophagitis Baseline hemoglobin above 10.  Continue to monitor Continue PPI Recent Labs    03/17/24 1405 04/10/24 0903 05/15/24 1039 07/03/24 2302 07/05/24 0233  HGB 10.2* 10.0* 10.5* 10.1* 9.4*  MCV 87.5 87.3 90 92.2 91.5   Obesity 2 Body mass index is 37.9 kg/m. Patient has been advised to make an attempt to improve diet and exercise patterns to aid in weight loss.  Anxiety and depression Continue Cymbalta  and mirtazapine     Restless leg syndrome Continue Mirapex , Lyrica  75 mg 3 times daily  OSA (obstructive sleep apnea) continue CPAP    Impaired mobility and ADLs PT/OT evals   Mobility: Bedbound status for the last few months PT Orders:   PT Follow up Rec:    Goals of care   Code Status: Limited: Do not attempt resuscitation (DNR) -DNR-LIMITED -Do Not Intubate/DNI      DVT prophylaxis:  enoxaparin  (LOVENOX ) injection 40 mg Start: 07/04/24 1045   Antimicrobials: Currently on Cipro  and Doxy oral Fluid: None Consultants: Orthopedics, ID Family Communication: Husband at bedside  Status: Inpatient Level of care:  Progressive   Patient is from: Home Needs to continue in-hospital care: Needs ID and orthopedic input, pending culture report Anticipated d/c to: Pending clinical course, PT eval      Diet:  Diet Order             Diet Carb Modified Fluid consistency:  Thin; Room service appropriate? Yes  Diet effective now                   Scheduled Meds:  ciprofloxacin   500 mg Oral BID   doxycycline   100 mg Oral BID   DULoxetine   60 mg Oral BID   enoxaparin  (LOVENOX ) injection  40 mg Subcutaneous Q24H   insulin  aspart  0-6 Units Subcutaneous TID AC & HS   insulin  glargine  25 Units Subcutaneous Q0600   mirtazapine   30 mg Oral QHS   multivitamin with minerals  1 tablet Oral Daily   pantoprazole   40 mg Oral Daily   polyethylene glycol  17 g Oral Daily   pramipexole   1 mg Oral QHS   pregabalin   75 mg Oral TID   senna-docusate  2 tablet Oral BID   sodium chloride  flush  3 mL Intravenous Q12H    PRN meds: acetaminophen  **OR** acetaminophen , melatonin, methocarbamol    Infusions:    Antimicrobials: Anti-infectives (From admission, onward)    Start     Dose/Rate Route Frequency Ordered Stop  07/04/24 1045  ciprofloxacin  (CIPRO ) tablet 500 mg        500 mg Oral 2 times daily 07/04/24 1036     07/04/24 1045  doxycycline  (VIBRA -TABS) tablet 100 mg        100 mg Oral 2 times daily 07/04/24 1036         Objective: Vitals:   07/05/24 0450 07/05/24 0725  BP: 122/64 138/85  Pulse: 98 96  Resp: 16 18  Temp: 98.2 F (36.8 C) 98 F (36.7 C)  SpO2: 97% 98%    Intake/Output Summary (Last 24 hours) at 07/05/2024 1455 Last data filed at 07/05/2024 0900 Gross per 24 hour  Intake 240 ml  Output 1600 ml  Net -1360 ml   Filed Weights   07/04/24 0721 07/05/24 0628  Weight: 90.7 kg 94 kg   Weight change:  Body mass index is 37.9 kg/m.   Physical Exam: General exam: Pleasant, elderly Caucasian female. Skin: No rashes, lesions or ulcers. HEENT: Atraumatic, normocephalic, no obvious bleeding Lungs: Clear to auscultation bilaterally,  CVS: S1, S2, no murmur,   GI/Abd: Soft, nontender, nondistended, bowel sound present,   CNS: Alert, awake, oriented x 3.  2 x 5 strength on wiggling both feet toes but unable to lift up legs Psychiatry:  Mood appropriate Extremities: Bilateral pitting 1+ pedal edema, no calf tenderness,   Data Review: I have personally reviewed the laboratory data and studies available.  F/u labs ordered Unresulted Labs (From admission, onward)     Start     Ordered   07/05/24 0500  Basic metabolic panel with GFR  Daily,   R     Question:  Specimen collection method  Answer:  Lab=Lab collect   07/04/24 1703   07/05/24 0500  CBC with Differential/Platelet  Daily,   R     Question:  Specimen collection method  Answer:  Lab=Lab collect   07/04/24 1703   07/05/24 0500  Magnesium   Daily,   R     Question:  Specimen collection method  Answer:  Lab=Lab collect   07/04/24 1703            Signed, Chapman Rota, MD [facility] 07/05/2024

## 2024-07-05 NOTE — Progress Notes (Signed)
   07/05/24 1947  BiPAP/CPAP/SIPAP  Reason BIPAP/CPAP not in use Non-compliant (Pt states she does not want to wear CPAP.)

## 2024-07-05 NOTE — Plan of Care (Signed)

## 2024-07-06 DIAGNOSIS — R600 Localized edema: Secondary | ICD-10-CM | POA: Diagnosis not present

## 2024-07-06 LAB — CBC WITH DIFFERENTIAL/PLATELET
Abs Immature Granulocytes: 0.02 K/uL (ref 0.00–0.07)
Basophils Absolute: 0 K/uL (ref 0.0–0.1)
Basophils Relative: 1 %
Eosinophils Absolute: 0.2 K/uL (ref 0.0–0.5)
Eosinophils Relative: 4 %
HCT: 26.7 % — ABNORMAL LOW (ref 36.0–46.0)
Hemoglobin: 8.7 g/dL — ABNORMAL LOW (ref 12.0–15.0)
Immature Granulocytes: 1 %
Lymphocytes Relative: 9 %
Lymphs Abs: 0.4 K/uL — ABNORMAL LOW (ref 0.7–4.0)
MCH: 29.3 pg (ref 26.0–34.0)
MCHC: 32.6 g/dL (ref 30.0–36.0)
MCV: 89.9 fL (ref 80.0–100.0)
Monocytes Absolute: 0.4 K/uL (ref 0.1–1.0)
Monocytes Relative: 11 %
Neutro Abs: 3 K/uL (ref 1.7–7.7)
Neutrophils Relative %: 74 %
Platelets: 139 K/uL — ABNORMAL LOW (ref 150–400)
RBC: 2.97 MIL/uL — ABNORMAL LOW (ref 3.87–5.11)
RDW: 15.6 % — ABNORMAL HIGH (ref 11.5–15.5)
WBC: 4 K/uL (ref 4.0–10.5)
nRBC: 0 % (ref 0.0–0.2)

## 2024-07-06 LAB — BASIC METABOLIC PANEL WITH GFR
Anion gap: 11 (ref 5–15)
BUN: 30 mg/dL — ABNORMAL HIGH (ref 8–23)
CO2: 29 mmol/L (ref 22–32)
Calcium: 8.4 mg/dL — ABNORMAL LOW (ref 8.9–10.3)
Chloride: 97 mmol/L — ABNORMAL LOW (ref 98–111)
Creatinine, Ser: 1.27 mg/dL — ABNORMAL HIGH (ref 0.44–1.00)
GFR, Estimated: 46 mL/min — ABNORMAL LOW (ref >60)
Glucose, Bld: 132 mg/dL — ABNORMAL HIGH (ref 70–99)
Potassium: 3.8 mmol/L (ref 3.5–5.1)
Sodium: 137 mmol/L (ref 135–145)

## 2024-07-06 LAB — GLUCOSE, CAPILLARY
Glucose-Capillary: 128 mg/dL — ABNORMAL HIGH (ref 70–99)
Glucose-Capillary: 203 mg/dL — ABNORMAL HIGH (ref 70–99)
Glucose-Capillary: 163 mg/dL — ABNORMAL HIGH (ref 70–99)
Glucose-Capillary: 202 mg/dL — ABNORMAL HIGH (ref 70–99)

## 2024-07-06 LAB — CK: Total CK: 37 U/L — ABNORMAL LOW (ref 38–234)

## 2024-07-06 LAB — MAGNESIUM: Magnesium: 1.5 mg/dL — ABNORMAL LOW (ref 1.7–2.4)

## 2024-07-06 MED ORDER — MAGNESIUM SULFATE 2 GM/50ML IV SOLN
2.0000 g | Freq: Once | INTRAVENOUS | Status: AC
  Administered 2024-07-06: 2 g via INTRAVENOUS
  Filled 2024-07-06: qty 50

## 2024-07-06 MED ORDER — CARVEDILOL 6.25 MG PO TABS
6.2500 mg | ORAL_TABLET | Freq: Two times a day (BID) | ORAL | Status: DC
  Administered 2024-07-06 – 2024-07-08 (×5): 6.25 mg via ORAL
  Filled 2024-07-06 (×5): qty 1

## 2024-07-06 NOTE — Progress Notes (Signed)
 Orthopedic Tech Progress Note Patient Details:  Katelyn [name] 1956-04-02 [id number]  Ortho Devices Type of Ortho Device: Arm sling Ortho Device/Splint Location: at bedside, for RUE Ortho Device/Splint Interventions: Ordered      [name] 07/06/2024, 2:52 PM

## 2024-07-06 NOTE — Evaluation (Signed)
 Physical Therapy Evaluation Patient Details Name: Katelyn [name] MRN: [id number] DOB: March 25, 1956 Today's Date: 07/06/2024  History of Present Illness  Katelyn [name] is a 68 y.o. female presented to the ED 07/02/24 with LE swelling and edema.  CTA of her chest showed a right shoulder mass concerning for abscess/granulomatous process and possible hematoma, and interval bone destruction and absence of humeral head and neck. 9/26 IR aspirated 25 cc. Xray shows anterior dislocation of the right humorous with associated erosive changes  of the humeral head and glenoid. PMHx: known avascular necrosis and advanced glenohumeral arthritis (awaiting sx), multiple lumbar surgeries, anxiety, chronic low back pain with multiple lumbar surgeries,  DM2, obesity, OSA, LBBB, HTN, hypercholesterolemia, CKD, CAD.   Clinical Impression  Pt admitted with above diagnosis. Examination limited by pt being a poor historian and lack of family member present. PTA, pt required assistance with functional mobility and ADLs. She lives with her husband in a one story house with a ramped entrance. Pt currently with functional limitations due to the deficits listed below (see PT Problem List). She required maxA for bed mobility and minA for transfers with 1 HHA. Pt required +2 assist for safety throughout mobility to aid in immobilizing and protecting RUE (conservative management & message sent to ortho for clarification on weight-bearing status and need for a sling). Pt was limited by impaired cognition, decreased safety awareness, pain, impaired balance, and limited activity tolerance. Pt will benefit from acute skilled PT to increase her independence and safety with mobility to allow discharge. Recommend continued inpatient follow up therapy, <3 hours/day.    If plan is discharge home, recommend the following: A lot of help with walking and/or transfers;A lot of help with bathing/dressing/bathroom;Assistance with cooking/housework;Assist for  transportation;Help with stairs or ramp for entrance   Can travel by private vehicle   No    Equipment Recommendations None recommended by PT  Recommendations for Other Services       Functional Status Assessment Patient has had a recent decline in their functional status and demonstrates the ability to make significant improvements in function in a reasonable and predictable amount of time.     Precautions / Restrictions Precautions Precautions: Fall Recall of Precautions/Restrictions: Impaired Precaution/Restrictions Comments: Instructed to maintain RUE NWB and keep it tucked against to her stomach to protect it. Pt frequently attempting to utilize RUE and complete shoulder motion during mobility. Assist to immobilize and protect R shoulder. Required Braces or Orthoses:  (Ordered sling for RUE, not present in room yet) Restrictions Weight Bearing Restrictions Per Provider Order: Yes (Maintained RUE NWB) RUE Weight Bearing Per Provider Order: Non weight bearing (messaged ortho PA 9/28 for orders; maintained NWB and no shoulder ROM)      Mobility  Bed Mobility Overal bed mobility: Needs Assistance Bed Mobility: Supine to Sit     Supine to sit: Max assist, +2 for safety/equipment     General bed mobility comments: Pt sat up on R side of bed with increased time. Assist d/t BLE edema resulting in weakness, LLE pain, and immobility of RUE. Pt states her husband assists with bed mobility at baseline.    Transfers Overall transfer level: Needs assistance Equipment used: 1 person hand held assist Transfers: Sit to/from Stand, Bed to chair/wheelchair/BSC Sit to Stand: Min assist, +2 safety/equipment   Step pivot transfers: Min assist, +2 safety/equipment       General transfer comment: Pt stood from lowest bed height. Assist via bed pad to scoot pt to  edge of surface. Instructed pt increase bilat knee flex. Powered up with minA. Increased time to obtain upright posture and  stabilize. Assist to manage RUE and maintain immobility throughout activity. Transferred to recliner chair on right. Poor eccentric control, plopping down.    Ambulation/Gait Ambulation/Gait assistance: Min assist, +2 safety/equipment Gait Distance (Feet): 2 Feet Assistive device: 1 person hand held assist Gait Pattern/deviations: Step-to pattern, Shuffle       General Gait Details: Pt took short slow steps lacking foot clearence to transfer to recliner chair. Cues for sequencing. Pt suddenly plopping down in chair before completing full turn with BLE touching surface of chair.  Stairs            Wheelchair Mobility     Tilt Bed    Modified Rankin (Stroke Patients Only)       Balance Overall balance assessment: Needs assistance Sitting-balance support: Feet supported Sitting balance-Leahy Scale: Good     Standing balance support: Single extremity supported, During functional activity Standing balance-Leahy Scale: Poor Standing balance comment: unsteady                             Pertinent Vitals/Pain Pain Assessment Pain Assessment: Faces Faces Pain Scale: Hurts even more Pain Location: R shoulder Pain Descriptors / Indicators: Discomfort Pain Intervention(s): Limited activity within patient's tolerance, Monitored during session, Repositioned    Home Living Family/patient expects to be discharged to:: Private residence Living Arrangements: Spouse/significant other Available Help at Discharge: Family;Available 24 hours/day Type of Home: House Home Access: Ramped entrance       Home Layout: One level Home Equipment: Agricultural consultant (2 wheels);Rollator (4 wheels);Wheelchair - manual;Shower seat;Grab bars - tub/shower;BSC/3in1;Hand held shower head;Adaptive equipment;Other (comment) (Urpgith) Additional Comments: Pt reports she was supposed to have a w/c assessment in [place] at a rehab facility tomorrow.    Prior Function Prior Level of Function  : Patient poor historian/Family not available;Needs assist;History of Falls (last six months)       Physical Assist : Mobility (physical);ADLs (physical) Mobility (physical): Bed mobility;Gait   Mobility Comments: Pt reports she would try to walk around short distances using upright walker (but ortho doc told her to stop using RW). Husband was pushing her around in w/c at home. A couple of falls at home. During session pt reported her husband aided her with getting in/out of bed and she could transfer by herself without AD. ADLs Comments: Husband assist with ADLs as needed - pt is a poor historian, difficult to get a clear understanding of PLOF     Extremity/Trunk Assessment   Upper Extremity Assessment Upper Extremity Assessment: Defer to OT evaluation RUE Deficits / Details: elbow wrist and hand are WFL, maintained R shoulder NWB and no ROM due to known deficits/injuries. Sling ordered but not present in room at the time of evaluation RUE Sensation: WNL RUE Coordination: decreased gross motor    Lower Extremity Assessment Lower Extremity Assessment: Generalized weakness    Cervical / Trunk Assessment Cervical / Trunk Assessment: Other exceptions Cervical / Trunk Exceptions: increased habitus  Communication   Communication Communication: No apparent difficulties    Cognition Arousal: Alert Behavior During Therapy: Impulsive   PT - Cognitive impairments: No family/caregiver present to determine baseline, Awareness, Safety/Judgement                       PT - Cognition Comments: Pt with decreased insight into current condition, reporting  she doesn't have a ball and socket shoulder joint she is missing her humeral head. Poor safety awareness. Slightly tangential in speech, but able to be redirected. Following commands: Impaired Following commands impaired: Only follows one step commands consistently, Follows multi-step commands inconsistently     Cueing Cueing  Techniques: Verbal cues     General Comments General comments (skin integrity, edema, etc.): VSS    Exercises     Assessment/Plan    PT Assessment Patient needs continued PT services  PT Problem List Decreased strength;Decreased range of motion;Decreased activity tolerance;Decreased balance;Decreased mobility;Decreased cognition;Decreased knowledge of use of DME;Decreased safety awareness;Decreased knowledge of precautions;Pain       PT Treatment Interventions DME instruction;Gait training;Stair training;Functional mobility training;Therapeutic activities;Therapeutic exercise;Balance training;Cognitive remediation;Patient/family education;Wheelchair mobility training    PT Goals (Current goals can be found in the Care Plan section)  Acute Rehab PT Goals Patient Stated Goal: Have less pain PT Goal Formulation: With patient Time For Goal Achievement: 07/20/24 Potential to Achieve Goals: Fair    Frequency Min 2X/week     Co-evaluation PT/OT/SLP Co-Evaluation/Treatment: Yes Reason for Co-Treatment: Complexity of the patient's impairments (multi-system involvement);For patient/therapist safety;To address functional/ADL transfers PT goals addressed during session: Mobility/safety with mobility;Balance OT goals addressed during session: ADL's and self-care       AM-PAC PT 6 Clicks Mobility  Outcome Measure Help needed turning from your back to your side while in a flat bed without using bedrails?: A Lot Help needed moving from lying on your back to sitting on the side of a flat bed without using bedrails?: A Lot Help needed moving to and from a bed to a chair (including a wheelchair)?: A Lot Help needed standing up from a chair using your arms (e.g., wheelchair or bedside chair)?: A Lot Help needed to walk in hospital room?: Total Help needed climbing 3-5 steps with a railing? : Total 6 Click Score: 10    End of Session Equipment Utilized During Treatment: Gait belt Activity  Tolerance: Patient tolerated treatment well;Patient limited by pain Patient left: in chair;with call bell/phone within reach;with chair alarm set Nurse Communication: Mobility status PT Visit Diagnosis: Other abnormalities of gait and mobility (R26.89);Pain;Difficulty in walking, not elsewhere classified (R26.2);Unsteadiness on feet (R26.81);Muscle weakness (generalized) (M62.81) Pain - Right/Left: Right Pain - part of body: Shoulder    Time: 9163-9144 PT Time Calculation (min) (ACUTE ONLY): 19 min   Charges:   PT Evaluation $PT Eval Moderate Complexity: 1 Mod   PT General Charges $$ ACUTE PT VISIT: 1 Visit         Randall SAUNDERS, PT, DPT Acute Rehabilitation Services Office: [phone] Secure Chat Preferred  Delon CHRISTELLA Callander 07/06/2024, 10:21 AM

## 2024-07-06 NOTE — Progress Notes (Signed)
 RT asked patient if she would like to wear CPAP tonight and patient stated that she does not want to wear CPAP and to take [employer] out of her room. RT will discontinue order.

## 2024-07-06 NOTE — Evaluation (Signed)
 Occupational Therapy Evaluation Patient Details Name: Katelyn [name] [name] MRN: [id number] DOB: 06/07/1956 Today's Date: 07/06/2024   History of Present Illness   Katelyn [name] [name] is Katelyn [name] 68 y.o. female presented to the ED 07/02/24 with LE swelling and edema.  CTA of her chest showed Katelyn [name] right shoulder mass concerning for abscess/granulomatous process and possible hematoma, and interval bone destruction and absence of humeral head and neck. 9/26 IR aspirated 25 cc. Xray shows anterior dislocation of the right humorous with associated erosive changes  of the humeral head and glenoid. PMHx: known avascular necrosis and advanced glenohumeral arthritis (awaiting sx), multiple lumbar surgeries, anxiety, chronic low back pain with multiple lumbar surgeries,  DM2, obesity, OSA, LBBB, HTN, hypercholesterolemia, CKD, CAD.     Clinical Impressions Katelyn [name] [name] was evaluated s/p the above admission list. She lives with her husband who assists with ADLs and mobility at baseline. Pt states she has had Katelyn [name] few falls, was ambulating some with an upright walker otherwise her husband was pushing her in Katelyn [name] WC. Upon evaluation the pt was limited by RUE NWB/no ROM (conservative mgmt & message sent to ortho for clarification), unsteady balance, limited tolerance for activity, weakness, impaired insight and cognition. Overall she needed max Katelyn [name] for bed mobility, min Katelyn [name] to standing and min Katelyn [name] to take pivotal steps without AD. Due to the deficits listed below the pt also needs up to mod Katelyn [name] for UB ADLs and max Katelyn [name] for UB ADLs. Pt will benefit from continued acute OT services and skilled inpatient follow up therapy, <3 hours/day - unless family decides to bring pt home with 24/7 direct physical assist for all ADLs and mobility.      If plan is discharge home, recommend the following:   Katelyn [name] lot of help with walking and/or transfers;Katelyn [name] lot of help with bathing/dressing/bathroom;Assistance with cooking/housework;Assist for transportation;Help with stairs or ramp  for entrance     Functional Status Assessment   Patient has had Katelyn [name] recent decline in their functional status and demonstrates the ability to make significant improvements in function in Katelyn [name] reasonable and predictable amount of time.     Equipment Recommendations   None recommended by OT      Precautions/Restrictions   Precautions Precautions: Fall Recall of Precautions/Restrictions: Impaired Required Braces or Orthoses:  (Ordered sling for RUE, not present in room yet) Restrictions Weight Bearing Restrictions Per Provider Order: Yes (Maintained RUE NWB) RUE Weight Bearing Per Provider Order: Non weight bearing (messaged ortho PA 9/28 for orders; maintained NWB and no shoulder ROM)     Mobility Bed Mobility Overal bed mobility: Needs Assistance Bed Mobility: Supine to Sit     Supine to sit: Max assist     General bed mobility comments: increased assist due to weak LLE and immobility of RUE. Pt states her husband assists with bed mobility at baseline    Transfers Overall transfer level: Needs assistance Equipment used: 1 person hand held assist Transfers: Sit to/from Stand, Bed to chair/wheelchair/BSC Sit to Stand: Min assist     Step pivot transfers: Min assist            Balance Overall balance assessment: Needs assistance Sitting-balance support: Feet supported Sitting balance-Leahy Scale: Good     Standing balance support: Single extremity supported, During functional activity Standing balance-Leahy Scale: Poor Standing balance comment: unsteady                           ADL either performed or  assessed with clinical judgement   ADL Overall ADL's : Needs assistance/impaired Eating/Feeding: Set up;Sitting   Grooming: Set up;Sitting   Upper Body Bathing: Moderate assistance;Sitting   Lower Body Bathing: Maximal assistance;Sit to/from stand   Upper Body Dressing : Minimal assistance;Sitting   Lower Body Dressing: Maximal  assistance;Sit to/from stand   Toilet Transfer: Minimal assistance;Stand-pivot;BSC/3in1   Toileting- Clothing Manipulation and Hygiene: Contact guard assist;Sitting/lateral lean       Functional mobility during ADLs: Minimal assistance General ADL Comments: min Katelyn [name] to stand and take pivotal steps from the bed>chair. Therapist assisted in holding RUE to maintain NWB and no ROM. UB assist due to RUE limitations     Vision Baseline Vision/History: 0 No visual deficits Vision Assessment?: No apparent visual deficits     Perception Perception: Not tested       Praxis Praxis: Not tested       Pertinent Vitals/Pain Pain Assessment Pain Assessment: Faces Faces Pain Scale: Hurts even more Pain Location: R shoulder Pain Descriptors / Indicators: Discomfort Pain Intervention(s): Limited activity within patient's tolerance, Monitored during session     Extremity/Trunk Assessment Upper Extremity Assessment Upper Extremity Assessment: RUE deficits/detail;Right hand dominant RUE Deficits / Details: elbow wrist and hand are WFL, maintained R shoulder NWB and no ROM due to known deficits/injuries. Sling ordered but not present in room at the time of evaluation RUE Sensation: WNL RUE Coordination: decreased gross motor   Lower Extremity Assessment Lower Extremity Assessment: Defer to PT evaluation   Cervical / Trunk Assessment Cervical / Trunk Assessment: Other exceptions Cervical / Trunk Exceptions: increased habitus   Communication Communication Communication: No apparent difficulties   Cognition Arousal: Alert Behavior During Therapy: Impulsive Cognition: No family/caregiver present to determine baseline             OT - Cognition Comments: overall WFL for orientation. Pt can be tangential with poor attention/distractibility and needs frequent cues for re-direction.                 Following commands: Impaired Following commands impaired: Only follows one step  commands consistently, Follows multi-step commands inconsistently     Cueing  General Comments   Cueing Techniques: Verbal cues  VSS   Exercises     Shoulder Instructions      Home Living Family/patient expects to be discharged to:: Private residence Living Arrangements: Spouse/significant other Available Help at Discharge: Family;Available 24 hours/day Type of Home: House Home Access: Ramped entrance     Home Layout: One level     Bathroom Shower/Tub: Producer, television/film/video: Handicapped height Bathroom Accessibility: Yes   Home Equipment: Agricultural consultant (2 wheels);Rollator (4 wheels);Wheelchair - manual;Shower seat;Grab bars - tub/shower;BSC/3in1;Hand held shower head;Adaptive equipment;Other (comment) (Urpgith) Adaptive Equipment: Reacher;Sock aid;Long-handled shoe horn;Long-handled sponge Additional Comments: Pt reports she was supposed to have Katelyn [name] w/c assessment in [place] at [name] tomorrow.      Prior Functioning/Environment Prior Level of Function : Patient poor historian/Family not available;Needs assist;History of Falls (last six months)       Physical Assist : Mobility (physical);ADLs (physical) Mobility (physical): Gait   Mobility Comments: Pt reports she would try to walk around short distances using upright walker (but ortho doc told her to stop using RW). Husband was pushing her around in w/c at home. Katelyn [name] couple of falls at home ADLs Comments: [place] assist with ADLs as needed - pt is Katelyn [name] poor historian, difficult to get Katelyn [name] clear understanding of PLOF  OT Problem List: Decreased strength;Decreased range of motion;Decreased activity tolerance;Impaired balance (sitting and/or standing);Decreased safety awareness;Decreased knowledge of use of DME or AE;Decreased knowledge of precautions;Impaired UE functional use;Pain   OT Treatment/Interventions: Self-care/ADL training;Therapeutic exercise;DME and/or AE instruction;Therapeutic  activities;Patient/family education;Balance training      OT Goals(Current goals can be found in the care plan section)   Acute Rehab OT Goals Patient Stated Goal: to feel better OT Goal Formulation: With patient Time For Goal Achievement: 07/20/24 Potential to Achieve Goals: Good   OT Frequency:  Min 2X/week    Co-evaluation PT/OT/SLP Co-Evaluation/Treatment: Yes Reason for Co-Treatment: Complexity of the patient's impairments (multi-system involvement);For patient/therapist safety;To address functional/ADL transfers PT goals addressed during session: Mobility/safety with mobility;Balance OT goals addressed during session: ADL's and self-care      AM-PAC OT 6 Clicks Daily Activity     Outcome Measure Help from another person eating meals?: Katelyn [name] Little Help from another person taking care of personal grooming?: Katelyn [name] Little Help from another person toileting, which includes using toliet, bedpan, or urinal?: Katelyn [name] Little Help from another person bathing (including washing, rinsing, drying)?: Katelyn [name] Lot Help from another person to put on and taking off regular upper body clothing?: Katelyn [name] Little Help from another person to put on and taking off regular lower body clothing?: Katelyn [name] Lot 6 Click Score: 16   End of Session Equipment Utilized During Treatment: Gait belt Nurse Communication: Mobility status (Soiled purwick removed)  Activity Tolerance: Patient tolerated treatment well Patient left: in chair;with call bell/phone within reach;with chair alarm set  OT Visit Diagnosis: Unsteadiness on feet (R26.81);Other abnormalities of gait and mobility (R26.89);Muscle weakness (generalized) (M62.81)                Time: 9164-9143 OT Time Calculation (min): 21 min Charges:  OT General Charges $OT Visit: 1 Visit OT Evaluation $OT Eval Moderate Complexity: 1 Mod  Lucie Kendall, OTR/L Acute Rehabilitation Services Office [phone] Secure Chat Communication Preferred   Lucie JONETTA Kendall 07/06/2024, 9:45 AM

## 2024-07-06 NOTE — Progress Notes (Signed)
 PROGRESS NOTE  [name]  DOB: August 03, 1956  PCP: Ofilia Lamar CROME, MD FMW:[id number]  DOA: 07/03/2024  LOS: 2 days  Hospital Day: 4  Brief narrative: Katelyn [name] is a 68 y.o. 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 Bedbound for last few months since the surgery. She has also had complaints of right shoulder pain outpatient and workup has been felt to be consistent with avascular necrosis with flattened humeral head.  However there has been some concern for potential infection but no plans for surgery during recent evaluations.  9/25, patient presented to ED with complaint of progressively worsening bilateral leg swelling, shortness of breath  In the ED, hemodynamically stable CT angio chest did not show pulm embolism but showed a large, heterogeneous mass-like area 7 cm in length engulfing the right shoulder, with interval bone destruction involving the glenohumeral area and probable surgical absence of the humeral head and neck, findings concerning for a large abscess/granulomatous process and possible hematoma.   Admitted to [facility] 9/26, successfully located right shoulder aspiration by IR.  Obtained 25 mL of serosanguineous fluid, sent to lab.  Subjective: Patient was seen and examined this a.m. Sitting up in recliner.  Not in distress.  Has PureWick on.  Feels better with that as she is incontinent and was using adult diapers at home. Remains afebrile, heart rate in 90s and 100s, blood pressure in the 130s this morning, breathing room air. Labs this morning with hemoglobin down to 8.7, platelet count 139, magnesium  low at 1.5 ID and orthopedics consult appreciated  Assessment and plan: Right shoulder  abscess CT findings as above 9/26, successfully located right shoulder aspiration by IR.  Obtained 25 mL of serosanguineous fluid, sent for culture. ID and orthopedics consult appreciated. Upgraded antibiotics to IV cefepime /daptomycin  Per orthopedics, no need of surgical intervention for now. Continue to monitor culture report. Pain regimen --- Scheduled:  --- PRN: Tylenol   h/o thoracic vertebral discitis/osteomyelitis  s/p surgical removal of hardware  Chronically on chronic suppressive antibiotics of doxycycline  and Cipro . Currently replaced with broad-spectrum IV antibiotics.  Impaired mobility and ADLs Bedbound for last few months since vertebral surgery. Has some strength on the both distal muscles but unable to support her weight.  At home, she was using upper body strength to transfer still out of bed but with the right shoulder condition, unable to do that.  Pending PT/OT eval.  Bilateral lower extremity edema Presented with lower extremity edema, BNP mildly elevated Most recent June 2025 showed EF 45 to 50%, no RWMA, grade 1 DD Follow-up repeat echo this admission showed an improved EF to 60 to 65%, no WMA, mild LVH Ultrasound duplex negative for DVT She has impressive bilateral lower extremity edema likely due to immobility from paraparesis. Was started on IV Lasix  but stopped because of rising creatinine. PTA meds- Coreg  6.25 mg twice daily, torsemide  20 mg daily Currently both on hold.  Because of ongoing tachycardia, I will resume Coreg  today. I will hold diuresis for now and plan to resume if creatinine is further better tomorrow.  Hypomagnesemia Magnesium  level low at 1.5.  Replacement ordered. Recent Labs  Lab 07/03/24 2302 07/05/24 0233 07/06/24 0644  K 3.8 3.9 3.8  MG  --  1.5* 1.5*   H/o CAD Not on any antiplatelet  or statin??  Probably because of liver cirrhosis  NASH cirrhosis Has chronic bilateral lower extremity edema probably worsened due to  bedbound status.  Ultrasound duplex negative for DVT No evidence of ascites currently. Diuretics plan as above Recent Labs  Lab 07/03/24 2302 07/05/24 0233 07/06/24 0644  AST 47*  --   --   ALT 32  --   --   ALKPHOS 269*  --   --   BILITOT 0.9  --   --   PROT 6.3*  --   --   ALBUMIN  2.6*  --   --   PLT 174 167 139*   Thrombocytopenia Mild, in the setting of liver cirrhosis.  Type 2 diabetes mellitus A1c 6.4 on 07/03/2024 PTA meds-Tresiba  40 units daily, Mounjaro weekly, Premeal NovoLog  3 times daily Currently on Lantus  25 units daily and SSI/Accu-Cheks only.  Blood sugar level gradually improving.  Continue to monitor. Recent Labs  Lab 07/05/24 0607 07/05/24 1128 07/05/24 1657 07/05/24 2118 07/06/24 0619  GLUCAP 131* 268* 215* 200* 128*   CKD 3B Creatinine was slightly elevated with dialysis.  Improving to normal.  Recent Labs    01/19/24 0348 01/21/24 0411 01/25/24 0400 01/28/24 0512 03/17/24 1405 04/10/24 0903 05/21/24 1213 07/03/24 2302 07/05/24 0233 07/06/24 0644  BUN 24* 26* 29* 25* 34* 24 24 23  25* 30*  CREATININE 1.34* 1.34* 1.45* 1.37* 1.18* 1.08* 1.06* 1.06* 1.37* 1.27*  CO2 26 27 28 28  32 33* 27 28 29 29    Tracheomalacia Noted on CT chest.  Not sure if it has any role on her current symptoms Follows up with pulmonologist Dr. Shellia as an outpatient  Mild chronic anemia Esophagitis Baseline hemoglobin above 10.  Downtrend noted.  No active bleeding. Continue to monitor.  Plan to transfuse if less than 7. Continue PPI.  Follows up with GI as an outpatient Recent Labs    04/10/24 0903 05/15/24 1039 07/03/24 2302 07/05/24 0233 07/06/24 0644  HGB 10.0* 10.5* 10.1* 9.4* 8.7*  MCV 87.3 90 92.2 91.5 89.9   Obesity 2 Body mass index is 37.9 kg/m. Patient has been advised to make an attempt to improve diet and exercise patterns to aid in weight loss.  Anxiety and depression Continue Cymbalta  and mirtazapine     Restless leg syndrome Continue  Mirapex , Lyrica  75 mg 3 times daily  OSA (obstructive sleep apnea) continue CPAP      Mobility: Bedbound status for the last few months.  PT eval requested PT Orders:   PT Follow up Rec: Skilled Nursing-Short Term Rehab (<3 Hours/Day)07/06/2024 1000   Goals of care   Code Status: Limited: Do not attempt resuscitation (DNR) -DNR-LIMITED -Do Not Intubate/DNI      DVT prophylaxis:  enoxaparin  (LOVENOX ) injection 40 mg Start: 07/04/24 1045   Antimicrobials: Currently on IV cefepime /daptomycin  Fluid: None Consultants: Orthopedics, ID Family Communication: Husband not at bedside today   Status: Inpatient Level of care:  Progressive   Patient is from: Home Needs to continue in-hospital care: Pending culture report  Anticipated d/c to: Pending clinical course      Diet:  Diet Order             Diet Carb Modified Fluid consistency: Thin; Room service appropriate? Yes  Diet effective now                   Scheduled Meds:  carvedilol   6.25 mg Oral BID WC   DULoxetine   60 mg Oral BID   enoxaparin  (LOVENOX ) injection  40 mg Subcutaneous Q24H   insulin  aspart  0-6 Units Subcutaneous TID AC & HS   insulin  glargine  25 Units Subcutaneous Q0600   mirtazapine   30 mg Oral QHS   multivitamin with minerals  1 tablet Oral Daily   pantoprazole   40 mg Oral Daily   polyethylene glycol  17 g Oral Daily   pramipexole   1 mg Oral QHS   pregabalin   75 mg Oral TID   senna-docusate  2 tablet Oral BID   sodium chloride  flush  3 mL Intravenous Q12H    PRN meds: acetaminophen  **OR** acetaminophen , melatonin, methocarbamol , mouth rinse   Infusions:   ceFEPime  (MAXIPIME ) IV 2 g (07/06/24 0634)   DAPTOmycin  Stopped (07/05/24 1753)   magnesium  sulfate bolus IVPB 2 g (07/06/24 1037)    Antimicrobials: Anti-infectives (From admission, onward)    Start     Dose/Rate Route Frequency Ordered Stop   07/05/24 1700  ceFEPIme  (MAXIPIME ) 2 g in sodium chloride  0.9 % 100 mL IVPB        2  g 200 mL/hr over 30 Minutes Intravenous Every 12 hours 07/05/24 1557     07/05/24 1700  DAPTOmycin  (CUBICIN ) IVPB 500 mg/50mL premix        8 mg/kg  67.7 kg (Adjusted) 100 mL/hr over 30 Minutes Intravenous Daily 07/05/24 1558     07/04/24 1045  ciprofloxacin  (CIPRO ) tablet 500 mg  Status:  Discontinued        500 mg Oral 2 times daily 07/04/24 1036 07/05/24 1541   07/04/24 1045  doxycycline  (VIBRA -TABS) tablet 100 mg  Status:  Discontinued        100 mg Oral 2 times daily 07/04/24 1036 07/05/24 1541       Objective: Vitals:   07/06/24 0409 07/06/24 0700  BP: 108/60 (!) 133/59  Pulse: (!) 103 (!) 106  Resp: 20 20  Temp: 98.2 F (36.8 C) 98 F (36.7 C)  SpO2: 95% 94%    Intake/Output Summary (Last 24 hours) at 07/06/2024 1039 Last data filed at 07/06/2024 0700 Gross per 24 hour  Intake 1110 ml  Output 750 ml  Net 360 ml   Filed Weights   07/04/24 0721 07/05/24 0628  Weight: 90.7 kg 94 kg   Weight change:  Body mass index is 37.9 kg/m.   Physical Exam: General exam: Pleasant, elderly Caucasian female. Skin: No rashes, lesions or ulcers. HEENT: Atraumatic, normocephalic, no obvious bleeding Lungs: Clear to auscultation bilaterally,  CVS: S1, S2, no murmur,   GI/Abd: Soft, nontender, nondistended, bowel sound present,   CNS: Alert, awake, oriented x 3.  2/5 strength on wiggling both feet toes but unable to lift up legs Psychiatry: Mood appropriate Extremities: Improving bilateral pitting 1+ pedal edema, no calf tenderness,   Data Review: I have personally reviewed the laboratory data and studies available.  F/u labs ordered Unresulted Labs (From admission, onward)     Start     Ordered   07/06/24 0500  CK  Weekly,   R     Question:  Specimen collection method  Answer:  Lab=Lab collect   07/05/24 1600   07/05/24 0500  Basic metabolic panel with GFR  Daily,   R     Question:  Specimen collection method  Answer:  Lab=Lab collect   07/04/24 1703   07/05/24 0500   CBC with Differential/Platelet  Daily,   R     Question:  Specimen collection method  Answer:  Lab=Lab collect   07/04/24 1703  07/05/24 0500  Magnesium   Daily,   R     Question:  Specimen collection method  Answer:  Lab=Lab collect   07/04/24 1703            Signed, Chapman Rota, MD [facility] 07/06/2024

## 2024-07-06 NOTE — Plan of Care (Signed)
  Problem: Fluid Volume: Goal: Ability to maintain a balanced intake and output will improve Outcome: Progressing   Problem: Metabolic: Goal: Ability to maintain appropriate glucose levels will improve Outcome: Progressing   Problem: Nutritional: Goal: Maintenance of adequate nutrition will improve Outcome: Progressing   Problem: Skin Integrity: Goal: Risk for impaired skin integrity will decrease Outcome: Progressing   Problem: Tissue Perfusion: Goal: Adequacy of tissue perfusion will improve Outcome: Progressing   Problem: Clinical Measurements: Goal: Ability to maintain clinical measurements within normal limits will improve Outcome: Progressing Goal: Diagnostic test results will improve Outcome: Progressing Goal: Respiratory complications will improve Outcome: Progressing Goal: Cardiovascular complication will be avoided Outcome: Progressing   Problem: Activity: Goal: Risk for activity intolerance will decrease Outcome: Progressing   Problem: Nutrition: Goal: Adequate nutrition will be maintained Outcome: Progressing   Problem: Elimination: Goal: Will not experience complications related to bowel motility Outcome: Progressing Goal: Will not experience complications related to urinary retention Outcome: Progressing   Problem: Pain Managment: Goal: General experience of comfort will improve and/or be controlled Outcome: Progressing   Problem: Safety: Goal: Ability to remain free from injury will improve Outcome: Progressing   Problem: Skin Integrity: Goal: Risk for impaired skin integrity will decrease Outcome: Progressing

## 2024-07-07 DIAGNOSIS — M19011 Primary osteoarthritis, right shoulder: Secondary | ICD-10-CM

## 2024-07-07 DIAGNOSIS — R600 Localized edema: Secondary | ICD-10-CM | POA: Diagnosis not present

## 2024-07-07 DIAGNOSIS — Z8619 Personal history of other infectious and parasitic diseases: Secondary | ICD-10-CM

## 2024-07-07 DIAGNOSIS — M87011 Idiopathic aseptic necrosis of right shoulder: Secondary | ICD-10-CM

## 2024-07-07 DIAGNOSIS — M24211 Disorder of ligament, right shoulder: Secondary | ICD-10-CM

## 2024-07-07 LAB — CBC WITH DIFFERENTIAL/PLATELET
Abs Immature Granulocytes: 0 K/uL (ref 0.00–0.07)
Basophils Absolute: 0 K/uL (ref 0.0–0.1)
Basophils Relative: 1 %
Eosinophils Absolute: 0.2 K/uL (ref 0.0–0.5)
Eosinophils Relative: 4 %
HCT: 29.5 % — ABNORMAL LOW (ref 36.0–46.0)
Hemoglobin: 9.6 g/dL — ABNORMAL LOW (ref 12.0–15.0)
Immature Granulocytes: 0 %
Lymphocytes Relative: 7 %
Lymphs Abs: 0.3 K/uL — ABNORMAL LOW (ref 0.7–4.0)
MCH: 29.2 pg (ref 26.0–34.0)
MCHC: 32.5 g/dL (ref 30.0–36.0)
MCV: 89.7 fL (ref 80.0–100.0)
Monocytes Absolute: 0.4 K/uL (ref 0.1–1.0)
Monocytes Relative: 11 %
Neutro Abs: 2.9 K/uL (ref 1.7–7.7)
Neutrophils Relative %: 77 %
Platelets: 119 K/uL — ABNORMAL LOW (ref 150–400)
RBC: 3.29 MIL/uL — ABNORMAL LOW (ref 3.87–5.11)
RDW: 15.5 % (ref 11.5–15.5)
WBC: 3.8 K/uL — ABNORMAL LOW (ref 4.0–10.5)
nRBC: 0 % (ref 0.0–0.2)

## 2024-07-07 LAB — GLUCOSE, CAPILLARY
Glucose-Capillary: 175 mg/dL — ABNORMAL HIGH (ref 70–99)
Glucose-Capillary: 176 mg/dL — ABNORMAL HIGH (ref 70–99)
Glucose-Capillary: 175 mg/dL — ABNORMAL HIGH (ref 70–99)
Glucose-Capillary: 175 mg/dL — ABNORMAL HIGH (ref 70–99)

## 2024-07-07 LAB — BASIC METABOLIC PANEL WITH GFR
Anion gap: 9 (ref 5–15)
BUN: 25 mg/dL — ABNORMAL HIGH (ref 8–23)
CO2: 25 mmol/L (ref 22–32)
Calcium: 8.4 mg/dL — ABNORMAL LOW (ref 8.9–10.3)
Chloride: 97 mmol/L — ABNORMAL LOW (ref 98–111)
Creatinine, Ser: 1.07 mg/dL — ABNORMAL HIGH (ref 0.44–1.00)
GFR, Estimated: 57 mL/min — ABNORMAL LOW (ref >60)
Glucose, Bld: 179 mg/dL — ABNORMAL HIGH (ref 70–99)
Potassium: 4.4 mmol/L (ref 3.5–5.1)
Sodium: 131 mmol/L — ABNORMAL LOW (ref 135–145)

## 2024-07-07 LAB — MAGNESIUM: Magnesium: 2 mg/dL (ref 1.7–2.4)

## 2024-07-07 MED ORDER — TORSEMIDE 20 MG PO TABS
20.0000 mg | ORAL_TABLET | Freq: Two times a day (BID) | ORAL | Status: DC
  Administered 2024-07-07 – 2024-07-08 (×3): 20 mg via ORAL
  Filled 2024-07-07 (×3): qty 1

## 2024-07-07 MED ORDER — DOXYCYCLINE HYCLATE 100 MG PO TABS
100.0000 mg | ORAL_TABLET | Freq: Two times a day (BID) | ORAL | Status: DC
  Administered 2024-07-07 – 2024-07-08 (×2): 100 mg via ORAL
  Filled 2024-07-07 (×2): qty 1

## 2024-07-07 MED ORDER — INSULIN GLARGINE 100 UNIT/ML ~~LOC~~ SOLN
35.0000 [IU] | Freq: Every day | SUBCUTANEOUS | Status: DC
  Administered 2024-07-08: 35 [IU] via SUBCUTANEOUS
  Filled 2024-07-07: qty 0.35

## 2024-07-07 MED ORDER — OXYCODONE HCL 5 MG PO TABS: 5.0000 mg | ORAL_TABLET | Freq: Four times a day (QID) | ORAL | Status: DC | PRN

## 2024-07-07 MED ORDER — DICLOFENAC SODIUM 1 % EX GEL: 2.0000 g | Freq: Four times a day (QID) | CUTANEOUS | Status: DC | PRN

## 2024-07-07 MED ORDER — CIPROFLOXACIN 500 MG/5ML (10%) PO SUSR
500.0000 mg | Freq: Two times a day (BID) | ORAL | Status: DC
Start: 2024-07-07 — End: 2024-07-08
  Administered 2024-07-07 – 2024-07-08 (×3): 500 mg via ORAL
  Filled 2024-07-07 (×5): qty 5

## 2024-07-07 MED ORDER — INSULIN GLARGINE 100 UNIT/ML ~~LOC~~ SOLN: 35.0000 [IU] | Freq: Every day | SUBCUTANEOUS | Status: DC

## 2024-07-07 NOTE — Progress Notes (Addendum)
 I have reviewed the entire case in detail with the above APP and discussed the plan in detail. Therefore, I agree with the diagnoses recorded above. In addition,  I have personally interviewed and examined the patient and have personally reviewed any x-ray/ CT scan images.   My additional thoughts are as follows:  The patient is a 68 year old female with known advanced AVN and advanced glenohumeral arthritis on conservative treatments and pending w/u for shoulder replacement. 25cc serosanguinous aspiration not c/w infectious process (on suppressive abx). CT scan with mass like area around the shoulder and interval bone destruction of the glenohumeral area favoring more advancement of the arthritis of the joint. Ortho following.   - Recommend stopping Daptomycin  and Cefepime   - Place back on suppressive cipro /doxy and follow up with Dr. Dea as previously planned    Katelyn Munch, MD [facility]   [facility]  Date of Admission:  07/03/2024      Total days of antibiotics 3   Cefepime   Daptomycin         ASSESSMENT: Katelyn [name] is a 68 y.o. female admitted with:   Right shoulder pain -  Known advanced AVN and advanced glenohumeral arthritis on conservative treatments and pending w/u for shoulder replacement. 25cc serosanguinous aspiration not c/w infectious process (on suppressive abx). CT scan with mass like area around the shoulder and interval bone destruction of the glenohumeral area favoring more advancement of the arthritis of the joint. Ortho following.  - stop dapto/cefepime  - place back on suppressive cipro /doxy and follow up with Dr. Dea as previously planned   H/O Spine infection serratia, p acnes -  Last thoracic revision on 01/2024 and she has been on chronic suppressive doxy/cipro  since.  - resume outpatient plan   History of Cefepime  Toxicity - She has some acute confusion again during this stay  - updated allergy in the chart.   Peripheral Edema B/L -  New problem for her   ID will sign off - please call back with any questions/concerns or if we can be of further assistance.    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   Right shoulder pain   Acute on chronic diastolic CHF (congestive heart failure) (HCC)    carvedilol   6.25 mg Oral BID WC   DULoxetine   60 mg Oral BID   insulin  aspart  0-6 Units Subcutaneous TID AC & HS   [START ON 07/08/2024] insulin  glargine  35 Units Subcutaneous Daily   mirtazapine   30 mg Oral QHS   multivitamin with minerals  1 tablet Oral Daily   pantoprazole   40 mg Oral Daily   polyethylene glycol  17 g Oral Daily   pramipexole   1 mg Oral QHS   pregabalin   75 mg Oral TID   senna-docusate  2 tablet Oral BID   sodium chloride  flush  3 mL Intravenous Q12H   torsemide   20 mg Oral BID    SUBJECTIVE: Shoulder hurts, not much change.  Sample from the fluid is negative for infectious concern.    Review of Systems: Review of Systems  Constitutional:  Negative for chills, diaphoresis and fever.  Musculoskeletal:  Positive for joint pain. Negative for back pain.    Allergies  Allergen Reactions   Atorvastatin Calcium      Other Reaction(s): Not available  atorvastatin calcium    Oxycodone  Other (See Comments)    Confusion, staring episodes  States she is not allergic.    Atorvastatin Nausea And Vomiting and Other (See Comments)    MYALGIAS   Pentazocine Other (See Comments)     headache  Other Reaction(s): Not available  pentazocine   Cefepime  Other (See Comments)    Presumed AMS/neurotoxicity in the setting of AKI  cefepime    Gabapentin  Swelling   Other Nausea Only    UNSPECIFIED Anesthesia    OBJECTIVE: Vitals:   07/07/24 0055 07/07/24 0416 07/07/24 0718 07/07/24 1147  BP: (!) 127/50 (!) 130/53 (!) 129/52 105/61  Pulse: 90 93 89 79  Resp: 20 18 20 18   Temp: 98.2 F (36.8 C) 98 F (36.7 C) (!) 97.5 F (36.4 C) 98.1 F (36.7 C)  TempSrc: Oral Oral Oral Oral  SpO2: 96% 97% 96% 95%  Weight:      Height:       Body mass index is 37.9 kg/m.  Physical Exam Constitutional:      Appearance: Normal appearance. She is not ill-appearing.  Cardiovascular:     Rate and Rhythm: Normal rate.  Pulmonary:     Effort: Pulmonary effort is normal.     Breath sounds: Normal breath sounds.  Musculoskeletal:        General: Swelling (pitting edema noted bilaterally to the knees) present.  Skin:    General: Skin is warm and dry.  Neurological:     Mental Status: She is alert and oriented to person, place, and time.     Lab Results Lab Results  Component Value Date   WBC 3.8 (L) 07/07/2024   HGB 9.6 (L) 07/07/2024   HCT 29.5 (L) 07/07/2024   MCV 89.7 07/07/2024   PLT 119 (L) 07/07/2024    Lab Results  Component Value Date   CREATININE 1.07 (H) 07/07/2024   BUN 25 (H) 07/07/2024   NA 131 (L) 07/07/2024   K 4.4 07/07/2024   CL 97 (L) 07/07/2024   CO2 25 07/07/2024    Lab Results  Component Value Date   ALT 32 07/03/2024   AST 47 (H) 07/03/2024   GGT 207 (H) 01/16/2024   ALKPHOS 269 (H) 07/03/2024   BILITOT 0.9 07/03/2024     Microbiology: Recent Results (from the past 240 hours)  Urine Culture     Status: Abnormal   Collection Time: 07/02/24  4:17 PM   Specimen: Urine, Clean Catch  Result Value Ref Range Status   Specimen Description URINE, CLEAN CATCH  Final   Special Requests NONE  Final   Culture (A)  Final    <10,000  COLONIES/mL INSIGNIFICANT GROWTH Performed at [facility], [address], [place], [place] [address]    Report Status 07/03/2024 FINAL  Final  Body fluid culture w Gram Stain     Status: None (Preliminary result)   Collection Time: 07/04/24  4:22 PM   Specimen: SHOULDER; Body Fluid  Result Value Ref Range Status   Specimen Description SHOULDER  Final   Special Requests NONE  Final   Gram Stain NO WBC SEEN NO ORGANISMS SEEN   Final   Culture   Final    NO GROWTH 3 DAYS NO ANAEROBES ISOLATED; CULTURE IN PROGRESS FOR 5 DAYS Performed at [facility], [address], [place], [place] [address]    Report Status PENDING  Incomplete  Culture, blood (Routine X 2) w Reflex to ID Panel     Status: None (Preliminary result)   Collection Time: 07/05/24  3:53 PM   Specimen: BLOOD LEFT ARM  Result Value Ref Range Status   Specimen Description BLOOD LEFT ARM  Final   Special Requests   Final    BOTTLES DRAWN AEROBIC AND ANAEROBIC Blood Culture results may not be optimal due to an inadequate volume of blood received in culture bottles   Culture   Final    NO GROWTH 2 DAYS Performed at [facility], [address], [place], [place] [address]    Report Status PENDING  Incomplete  Culture, blood (Routine X 2) w Reflex to ID Panel     Status: None (Preliminary result)   Collection Time: 07/05/24  3:53 PM   Specimen: BLOOD LEFT HAND  Result Value Ref Range Status   Specimen Description BLOOD LEFT HAND  Final   Special Requests   Final    BOTTLES DRAWN AEROBIC AND ANAEROBIC Blood Culture results may not be optimal due to an inadequate volume of blood received in culture bottles   Culture   Final    NO GROWTH 2 DAYS Performed at [facility], [address] [place], [place] [address]    Report Status PENDING  Incomplete     Corean Fireman, MSN, NP-C [facility]  [email or web address] Pager:  [phone] Office: [phone] [facility] Main Line: [phone] *Secure Chat Communication Welcome  Total Encounter Time: 15 m

## 2024-07-07 NOTE — NC FL2 (Signed)
 [place]  MEDICAID FL2 LEVEL OF CARE FORM     IDENTIFICATION  Patient Name: Katelyn [name] Birthdate: [birth year] Sex: female Admission Date (Current Location): 07/03/2024  [facility] and [place] Number:  Producer, television/film/video and Address:  [facility], [address] [place], [place] [address]      Provider Number: [id number]  Attending Physician Name and Address:  Arlice Reichert, MD  Relative Name and Phone Number:  Cubbage,Jerry (Spouse)  [phone]    Current Level of Care: Hospital Recommended Level of Care: Skilled Nursing Facility Prior Approval Number:    Date Approved/Denied:   PASRR Number: [id number]  Discharge Plan: SNF    Current Diagnoses: Patient Active Problem List   Diagnosis Date Noted   Lower extremity edema 07/04/2024   Acute on chronic diastolic CHF (congestive heart failure) (HCC) 07/04/2024   Aortic valve disorder 05/21/2024   Diastolic dysfunction 05/21/2024   Malnutrition of moderate degree Gregoria: 60% to less than 75% of standard weight) 05/21/2024   Right shoulder pain 04/10/2024   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 02/19/2024   Medication management 02/19/2024   Presence of retained hardware 02/19/2024   Neurogenic bladder 02/10/2024   Neuropathic pain 02/06/2024   Chronic pain syndrome 01/25/2024   Thoracic myelopathy 01/11/2024   Thoracic spondylosis with myelopathy 01/07/2024   Hepatic cirrhosis (HCC) 11/22/2023   ACS (acute coronary syndrome) (HCC) 04/06/2023   Acute sciatica 04/06/2023   Dyslipidemia 04/06/2023   Hypertensive heart and chronic kidney disease 04/06/2023   Hyponatremia 04/06/2023   Hypothermia 04/06/2023   Renal insufficiency 04/06/2023   SIRS (systemic inflammatory response syndrome) (HCC) 04/06/2023   Slurred speech 04/06/2023   Thrombocytopenia 04/06/2023   Ventricular tachycardia, nonsustained (HCC) 04/06/2023   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   Acute renal failure (ARF) 03/13/2023    Elevated LFTs 03/13/2023   AKI (acute kidney injury) 02/20/2023   Status post thoracic spinal fusion 02/19/2023   Discitis 02/13/2023   Malfunction of spinal cord stimulator 02/13/2023   Serratia 02/13/2023   Diabetes mellitus without complication (HCC) 02/13/2023   Osteomyelitis (HCC) 02/05/2023   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   Infection of spinal cord stimulator 12/07/2022   Lactic acidosis 12/06/2022   Hypomagnesemia 12/06/2022   Anemia of chronic disease 12/06/2022   Diskitis 12/05/2022   Status post insertion of spinal cord stimulator 11/22/2022   Acute thoracic back pain 06/22/2022   Other long term (current) drug therapy 06/22/2022   Lumbar spondylosis 06/22/2022   Thoracic spondylosis 06/22/2022   Bilateral sacroiliitis 06/22/2022   Chronic, continuous use of opioids 05/31/2022   Pain medication agreement 05/02/2022   Postlaminectomy syndrome of lumbar region 05/02/2022   Radiculopathy of thoracolumbar region 05/02/2022   Obesity (BMI 30-39.9) 05/02/2022   Impaired mobility and ADLs 05/02/2022   Mastodynia 04/13/2022   Peripheral artery disease 07/11/2021   COVID-19 virus infection 06/21/2021   Rib pain on left side 06/06/2021   Abdominal pain 06/05/2021   Diabetes 1.5, 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   GAD (generalized anxiety disorder) 11/25/2020   Anxiety and depression 11/03/2020   DM2 (diabetes mellitus, type 2) (HCC)    Restless leg syndrome    PONV (postoperative nausea and vomiting)    Neuropathy    Lumbar stenosis    Left bundle branch block    Hypercholesterolemia    Heart murmur    History of kidney stones    H/O hiatal hernia  Essential hypertension    Dyspnea    Chronic depression    Complication of anesthesia    CKD stage 3b, GFR 30-44 ml/min (HCC)    CAD (coronary artery disease)    Bursitis of right hip    Arthritis    Anxiety    Morbid obesity (HCC) 10/07/2020   Hypoxia 10/07/2020    History of esophageal stricture 10/07/2020   CKD (chronic kidney disease), stage III (HCC) 10/07/2020   Leg weakness 10/07/2020   UTI (urinary tract infection) 10/07/2020   Chronic hypoxemic respiratory failure (HCC) 10/07/2020   Pulmonary hypertension (HCC) 10/06/2020   Chest pain, neg MI, stable CAD non obstructive on cath 10/05/20 10/04/2020   Hardening of the aorta (main artery of the heart) 11/25/2019   Hypertensive heart disease with congestive heart failure and chronic kidney disease (HCC) 11/25/2019   Shortness of breath 06/11/2019   Fatty liver 04/08/2019   Migraine 04/08/2019   History of chronic kidney disease 04/08/2019   Migraine without aura and responsive to treatment 04/08/2019   Acute non-recurrent sinusitis 10/12/2017   Chronic diastolic CHF (congestive heart failure) (HCC) 08/20/2017   Genetic testing 08/17/2017   Family history of ovarian cancer 08/17/2017   Family history of breast cancer 08/17/2017   Cough 04/19/2017   DOE (dyspnea on exertion) 04/19/2017   Type 2 diabetes mellitus with diabetic neuropathy, unspecified (HCC) 01/16/2017   Hypertensive heart disease with heart failure (HCC) 01/01/2017   Hyperlipidemia 01/01/2017   LBBB (left bundle branch block) 01/01/2017   Elevated liver enzymes 12/05/2016   Meralgia paraesthetica 12/05/2016   Tinnitus 12/05/2016   Spinal stenosis of lumbar region with neurogenic claudication 12/05/2016   Wound dehiscence 10/23/2016   S/P lumbar spinal fusion 08/29/2016   OSA (obstructive sleep apnea) 05/24/2016   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   Chronic low back pain without sciatica 03/14/2016   GERD (gastroesophageal reflux disease) 03/14/2016   Gout 03/14/2016   Iron  deficiency anemia due to chronic blood loss 03/14/2016   Type 2 diabetes mellitus without complication, with long-term current use of insulin  (HCC) 03/14/2016   Recurrent major depression in remission 12/07/2015   Restless leg 12/07/2015   Insomnia  12/07/2015   S/P lumbar laminectomy 11/26/2015   Postprocedural state 11/26/2015   Mild CAD 11/24/2015   Lumbar radiculopathy 10/15/2015   History of blood transfusion 2016   Kidney stone 04/20/2014   Stricture of esophagus 04/20/2014   Greater trochanteric bursitis of right hip 02/02/2012   Iliotibial band syndrome of right side 02/02/2012   Iliotibial band syndrome 02/02/2012   Bursitis, trochanteric 02/02/2012    Orientation RESPIRATION BLADDER Height & Weight     Self, Time, Situation, Place  Normal Continent, External catheter Weight: 207 lb 3.7 oz (94 kg) Height:  5' 2 (157.5 cm)  BEHAVIORAL SYMPTOMS/MOOD NEUROLOGICAL BOWEL NUTRITION STATUS      Continent Diet (See discharge summary)  AMBULATORY STATUS COMMUNICATION OF NEEDS Skin   Extensive Assist Verbally Normal                       Personal Care Assistance Level of Assistance  Bathing, Dressing, Feeding Bathing Assistance: Maximum assistance Feeding assistance: Limited assistance Dressing Assistance: Maximum assistance     Functional Limitations Info  Sight, Hearing, Speech Sight Info: Adequate Hearing Info: Adequate Speech Info: Adequate    SPECIAL CARE FACTORS FREQUENCY  PT (By licensed PT), OT (By licensed OT)     PT Frequency: 5x week OT Frequency: 5x  week            Contractures Contractures Info: Not present    Additional Factors Info  Code Status, Allergies, Insulin  Sliding Scale Code Status Info: DNR limited Allergies Info: Atorvastatin Calcium , Oxycodone , Atorvastatin, Pentazocine, Cefepime , Gabapentin , Other   Insulin  Sliding Scale Info: Please see discharge summary       Current Medications (07/07/2024):  This is the current hospital active medication list Current Facility-Administered Medications  Medication Dose Route Frequency Provider Last Rate Last Admin   acetaminophen  (TYLENOL ) tablet 650 mg  650 mg Oral Q6H PRN Patsy Lenis, MD   650 mg at 07/04/24 2124   Or    acetaminophen  (TYLENOL ) suppository 650 mg  650 mg Rectal Q6H PRN Patsy Lenis, MD       carvedilol  (COREG ) tablet 6.25 mg  6.25 mg Oral BID WC Dahal, Binaya, MD   6.25 mg at 07/07/24 0601   ceFEPIme  (MAXIPIME ) 2 g in sodium chloride  0.9 % 100 mL IVPB  2 g Intravenous Q12H Williamson, Erin R, RPH 200 mL/hr at 07/07/24 0601 2 g at 07/07/24 0601   DAPTOmycin  (CUBICIN ) IVPB 500 mg/77mL premix  8 mg/kg (Adjusted) Intravenous Q1400 Billy Rocky SAUNDERS, RPH 100 mL/hr at 07/06/24 1331 500 mg at 07/06/24 1331   diclofenac  Sodium (VOLTAREN ) 1 % topical gel 2 g  2 g Topical QID PRN Dahal, Chapman, MD       DULoxetine  (CYMBALTA ) DR capsule 60 mg  60 mg Oral BID Patsy Lenis, MD   60 mg at 07/07/24 9065   insulin  aspart (novoLOG ) injection 0-6 Units  0-6 Units Subcutaneous TID AC & HS Patsy Lenis, MD   1 Units at 07/07/24 0611   [START ON 07/08/2024] insulin  glargine (LANTUS ) injection 35 Units  35 Units Subcutaneous Daily Dahal, Binaya, MD       melatonin tablet 5 mg  5 mg Oral QHS PRN Patsy Lenis, MD   5 mg at 07/06/24 2109   methocarbamol  (ROBAXIN ) tablet 500 mg  500 mg Oral BID PRN Patsy Lenis, MD   500 mg at 07/06/24 2109   mirtazapine  (REMERON ) tablet 30 mg  30 mg Oral QHS Girguis, David, MD   30 mg at 07/06/24 2109   multivitamin with minerals tablet 1 tablet  1 tablet Oral Daily Patsy Lenis, MD   1 tablet at 07/06/24 1329   Oral care mouth rinse  15 mL Mouth Rinse PRN Dahal, Chapman, MD       oxyCODONE  (Oxy IR/ROXICODONE ) immediate release tablet 5 mg  5 mg Oral Q6H PRN Dahal, Chapman, MD       pantoprazole  (PROTONIX ) EC tablet 40 mg  40 mg Oral Daily Girguis, David, MD   40 mg at 07/07/24 9065   polyethylene glycol (MIRALAX  / GLYCOLAX ) packet 17 g  17 g Oral Daily Patsy Lenis, MD   17 g at 07/07/24 9065   pramipexole  (MIRAPEX ) tablet 1 mg  1 mg Oral QHS Patsy Lenis, MD   1 mg at 07/06/24 2109   pregabalin  (LYRICA ) capsule 75 mg  75 mg Oral TID Patsy Lenis, MD   75 mg at 07/07/24  9065   senna-docusate (Senokot-S) tablet 2 tablet  2 tablet Oral BID Patsy Lenis, MD   2 tablet at 07/07/24 0934   sodium chloride  flush (NS) 0.9 % injection 3 mL  3 mL Intravenous Q12H Girguis, David, MD   3 mL at 07/07/24 9062   torsemide  (DEMADEX ) tablet 20 mg  20 mg Oral BID Dahal, Binaya,  MD         Discharge Medications: Please see discharge summary for a list of discharge medications.  Relevant Imaging Results:  Relevant Lab Results:   Additional Information SSN [id number]  Luise JAYSON Pan, LCSWA

## 2024-07-07 NOTE — Plan of Care (Signed)
  Problem: Metabolic: Goal: Ability to maintain appropriate glucose levels will improve Outcome: Progressing   Problem: Nutritional: Goal: Maintenance of adequate nutrition will improve Outcome: Progressing   Problem: Skin Integrity: Goal: Risk for impaired skin integrity will decrease Outcome: Progressing   Problem: Clinical Measurements: Goal: Ability to maintain clinical measurements within normal limits will improve Outcome: Progressing Goal: Will remain free from infection Outcome: Progressing

## 2024-07-07 NOTE — Progress Notes (Signed)
 PROGRESS NOTE  Katelyn [name]  DOB: November 18, 1955  PCP: Ofilia Lamar CROME, MD FMW:[id number]  DOA: 07/03/2024  LOS: 3 days  Hospital Day: 5  Brief narrative: Katelyn [name] is a 68 y.o. 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 Bedbound for last few months since the surgery. She has also had complaints of right shoulder pain outpatient and workup has been felt to be consistent with avascular necrosis with flattened humeral head.  However there has been some concern for potential infection but no plans for surgery during recent evaluations.  9/25, patient presented to ED with complaint of progressively worsening bilateral leg swelling, shortness of breath  In the ED, hemodynamically stable CT angio chest did not show pulm embolism but showed a large, heterogeneous mass-like area 7 cm in length engulfing the right shoulder, with interval bone destruction involving the glenohumeral area and probable surgical absence of the humeral head and neck, findings concerning for a large abscess/granulomatous process and possible hematoma.   Admitted to [facility] 9/26, successfully located right shoulder aspiration by IR.  Obtained 25 mL of serosanguineous fluid, sent to lab.  Subjective: Patient was seen and examined this a.m. Propped up in bed.  Not in distress.  Husband at bedside. Leg swelling persists.  Creatinine improving. Patient and husband are concerned about the use of IV cefepime .  Reports that she had confusion in the past due to IV cefepime . Afebrile, hemodynamically stable, breathing on room air Labs from this morning with sodium low at 131, creatinine better at 1.07,  Assessment and plan: Right shoulder abscess CT  findings as above 9/26, successfully located right shoulder aspiration by IR.  Obtained 25 mL of serosanguineous fluid, sent for culture. ID and orthopedics consult appreciated. Prior to admission, patient was on chronic suppressive antibiotic treatment with doxycycline  and ciprofloxacin . Currently on upgraded antibiotic regimen with IV cefepime /daptomycin  per ID recommendation. Patient and husband are concerned about the use of IV cefepime .  Reports that she had confusion in the past due to IV cefepime .  Defer to ID. Per orthopedics, no need of surgical intervention for now. Continue to monitor culture report. Patient follows up with pain management specialist as an outpatient.  On as needed oxycodone  and Voltaren  gel.  h/o thoracic vertebral discitis/osteomyelitis  s/p surgical removal of hardware  Antibiotic plan as above.  Bilateral lower extremity edema Presented with lower extremity edema, BNP mildly elevated. Most recent June 2025 showed EF 45 to 50%, no RWMA, grade 1 DD Follow-up repeat echo this admission showed an improved EF to 60 to 65%, no WMA, mild LVH Ultrasound duplex negative for DVT. Initially suspected to have CHF exacerbation and was diuresed with IV Lasix  which was later stopped because of elevated creatinine.   PTA meds- Coreg  6.25 mg twice daily, torsemide  20 mg daily Currently on Coreg  6.25 mg twice daily.  Torsemide  remains on hold.  Creatinine seems improving.  Bilateral pedal edema remains.  Resume torsemide  today.  Also start bilateral Ace wrap compressions.  Hypomagnesemia Magnesium  level improved with replacement Recent Labs  Lab 07/03/24 2302 07/05/24 0233 07/06/24 0644 07/07/24 0735  K 3.8 3.9 3.8 4.4  MG  --  1.5* 1.5* 2.0   H/o CAD Not on any antiplatelet or statin??  Probably because of liver cirrhosis  NASH cirrhosis Has  chronic bilateral lower extremity edema probably worsened due to bedbound status.  Ultrasound duplex negative for DVT No  evidence of ascites currently. Diuretics plan as above Recent Labs  Lab 07/03/24 2302 07/05/24 0233 07/06/24 0644 07/07/24 0735  AST 47*  --   --   --   ALT 32  --   --   --   ALKPHOS 269*  --   --   --   BILITOT 0.9  --   --   --   PROT 6.3*  --   --   --   ALBUMIN  2.6*  --   --   --   PLT 174 167 139* 119*   Thrombocytopenia Mild, in the setting of liver cirrhosis. Continue to monitor. Because of gradual decline in platelets, I would stop Lovenox  for DVT prophylaxis and start SCDs  Type 2 diabetes mellitus A1c 6.4 on 07/03/2024 PTA meds-Tresiba  40 units daily, Mounjaro weekly, Premeal NovoLog  3 times daily Currently on Lantus  25 units daily and SSI/Accu-Cheks only.  Blood sugar level gradually improving but is still elevated.  Will increase Lantus  to 35 units for tomorrow.   Recent Labs  Lab 07/06/24 0619 07/06/24 1057 07/06/24 1659 07/06/24 2107 07/07/24 0610  GLUCAP 128* 203* 163* 202* 175*   CKD 3B Creatinine was slightly elevated with dialysis.  Improving to normal.  Recent Labs    01/21/24 0411 01/25/24 0400 01/28/24 0512 03/17/24 1405 04/10/24 0903 05/21/24 1213 07/03/24 2302 07/05/24 0233 07/06/24 0644 07/07/24 0735  BUN 26* 29* 25* 34* 24 24 23  25* 30* 25*  CREATININE 1.34* 1.45* 1.37* 1.18* 1.08* 1.06* 1.06* 1.37* 1.27* 1.07*  CO2 27 28 28  32 33* 27 28 29 29 25    Hyponatremia Sodium level low at 131 today.  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  Not sure if it has any role on her current symptoms Follows up with pulmonologist Dr. Shellia as an outpatient  Mild chronic anemia Esophagitis Baseline hemoglobin above 10.  No active bleeding. Continue to monitor.  Plan to transfuse if less than 7. Continue PPI.  Follows up with GI as an outpatient Recent Labs    05/15/24 1039 07/03/24 2302 07/05/24 0233 07/06/24 0644  07/07/24 0735  HGB 10.5* 10.1* 9.4* 8.7* 9.6*  MCV 90 92.2 91.5 89.9 89.7   Obesity 2 Body mass index is 37.9 kg/m. Patient has been advised to make an attempt to improve diet and exercise patterns to aid in weight loss.  Anxiety and depression Continue Cymbalta  and mirtazapine     Restless leg syndrome Continue Mirapex , Lyrica  75 mg 3 times daily  OSA (obstructive sleep apnea) continue CPAP    Impaired mobility and ADLs Bedbound for last few months since vertebral surgery. Has some strength on the both distal muscles but unable to support her weight.  At home, she was using upper body strength to transfer still out of bed but with the right shoulder condition, unable to do that.   PT/OT eval obtained.  SNF recommended  PT Orders:   PT Follow up Rec: Skilled Nursing-Short Term Rehab (<3 Hours/Day)07/06/2024 1000   Goals of care   Code Status: Limited: Do not attempt resuscitation (DNR) -DNR-LIMITED -Do Not Intubate/DNI      DVT prophylaxis:  Place and maintain sequential compression device Start: 07/07/24 0847   Antimicrobials: Currently on IV cefepime /daptomycin  Fluid: None Consultants:  Orthopedics, ID Family Communication: Husband at bedside today   Status: Inpatient Level of care:  Progressive   Patient is from: Home Needs to continue in-hospital care: Pending culture report  Anticipated d/c to: Pending clinical course     Diet:  Diet Order             Diet Carb Modified Fluid consistency: Thin; Room service appropriate? Yes  Diet effective now                   Scheduled Meds:  carvedilol   6.25 mg Oral BID WC   DULoxetine   60 mg Oral BID   insulin  aspart  0-6 Units Subcutaneous TID AC & HS   [START ON 07/08/2024] insulin  glargine  35 Units Subcutaneous Daily   mirtazapine   30 mg Oral QHS   multivitamin with minerals  1 tablet Oral Daily   pantoprazole   40 mg Oral Daily   polyethylene glycol  17 g Oral Daily   pramipexole   1 mg Oral QHS    pregabalin   75 mg Oral TID   senna-docusate  2 tablet Oral BID   sodium chloride  flush  3 mL Intravenous Q12H   torsemide   20 mg Oral BID    PRN meds: acetaminophen  **OR** acetaminophen , diclofenac  Sodium, melatonin, methocarbamol , mouth rinse, oxyCODONE    Infusions:   ceFEPime  (MAXIPIME ) IV 2 g (07/07/24 0601)   DAPTOmycin  500 mg (07/06/24 1331)    Antimicrobials: Anti-infectives (From admission, onward)    Start     Dose/Rate Route Frequency Ordered Stop   07/05/24 1700  ceFEPIme  (MAXIPIME ) 2 g in sodium chloride  0.9 % 100 mL IVPB        2 g 200 mL/hr over 30 Minutes Intravenous Every 12 hours 07/05/24 1557     07/05/24 1700  DAPTOmycin  (CUBICIN ) IVPB 500 mg/50mL premix        8 mg/kg  67.7 kg (Adjusted) 100 mL/hr over 30 Minutes Intravenous Daily 07/05/24 1558     07/04/24 1045  ciprofloxacin  (CIPRO ) tablet 500 mg  Status:  Discontinued        500 mg Oral 2 times daily 07/04/24 1036 07/05/24 1541   07/04/24 1045  doxycycline  (VIBRA -TABS) tablet 100 mg  Status:  Discontinued        100 mg Oral 2 times daily 07/04/24 1036 07/05/24 1541       Objective: Vitals:   07/07/24 0416 07/07/24 0718  BP: (!) 130/53 (!) 129/52  Pulse: 93 89  Resp: 18 20  Temp: 98 F (36.7 C) (!) 97.5 F (36.4 C)  SpO2: 97% 96%    Intake/Output Summary (Last 24 hours) at 07/07/2024 1050 Last data filed at 07/07/2024 0907 Gross per 24 hour  Intake 1118.61 ml  Output 1300 ml  Net -181.39 ml   Filed Weights   07/04/24 0721 07/05/24 0628  Weight: 90.7 kg 94 kg   Weight change:  Body mass index is 37.9 kg/m.   Physical Exam: General exam: Pleasant, elderly Caucasian female. Skin: No rashes, lesions or ulcers. HEENT: Atraumatic, normocephalic, no obvious bleeding Lungs: Clear to auscultation bilaterally,  CVS: S1, S2, no murmur,   GI/Abd: Soft, nontender, nondistended, bowel sound present,   CNS: Alert, awake, oriented x 3.  2/5 strength on wiggling both feet toes but unable to lift up  legs Psychiatry: Mood appropriate Extremities: Persistent bilateral pitting 1+ pedal edema, no calf tenderness,   Data Review: I have personally reviewed the laboratory data and studies available.  F/u labs ordered  Unresulted Labs (From admission, onward)     Start     Ordered   07/06/24 0500  CK  Weekly,   R     Question:  Specimen collection method  Answer:  Lab=Lab collect   07/05/24 1600   07/05/24 0500  Basic metabolic panel with GFR  Daily,   R     Question:  Specimen collection method  Answer:  Lab=Lab collect   07/04/24 1703   07/05/24 0500  CBC with Differential/Platelet  Daily,   R     Question:  Specimen collection method  Answer:  Lab=Lab collect   07/04/24 1703   07/05/24 0500  Magnesium   Daily,   R     Question:  Specimen collection method  Answer:  Lab=Lab collect   07/04/24 1703            Signed, Chapman Rota, MD [facility] 07/07/2024

## 2024-07-07 NOTE — TOC Initial Note (Addendum)
 Transition of Care [facility]) - Initial/Assessment Note    Patient Details  Name: Katelyn [name] MRN: [id number] Date of Birth: May 31, 1956  [facility]) CM/SW Contact:    Luise JAYSON Pan, LCSWA Phone Number: 07/07/2024, 10:50 AM  Clinical Narrative:   Patient is from home with spouse. Patient has HH hx with [facility]. Patient has the following DME: rollator/ walker/ wheelchair/ shower seat/ BSC. Patietnt has a PCP and several specialty doctors. Patient has reliable transportation (her husband).       CSW introduced self and role to patient and spouse at bedside. CSW discussed PT recs for SNF rehab. Patient stated she does not think she needs SNF rehab but is open to seeing bed offers. Patient stated she would be more receptive to home health. Patient husband is agreeable with what the patient wants, either SNF or HH.   12:43 PM CSW provided medicare.gov list of accepting SNFs to patient and husband at bedside. Husband and patient stated they would like patient to go to [place].   1:33PM CSW calle [employer] to start auth for SNF and PTAR. CSW awaiting call back on determination after medical team reviews request.   CSW will continue to follow.    Expected Discharge Plan:  (SNF vs Home w/ HH) Barriers to Discharge: Continued Medical Work up   Patient Goals and CMS Choice Patient states their goals for this hospitalization and ongoing recovery are:: To go home          Expected Discharge Plan and Services In-house Referral: Clinical Social Work Discharge Planning Services: CM Consult   Living arrangements for the past 2 months: Single Family Home                                      Prior Living Arrangements/Services Living arrangements for the past 2 months: Single Family Home Lives with:: Spouse Patient language and need for interpreter reviewed:: Yes        Need for Family Participation in Patient Care: No (Comment) Care giver support system in  place?: Yes (comment) Current home services: DME (rollator/ walker/ wheelchair/ shower seat/ BSC) Criminal Activity/Legal Involvement Pertinent to Current Situation/Hospitalization: No - Comment as needed  Activities of Daily Living   ADL Screening (condition at time of admission) Independently performs ADLs?: No Does the patient have a NEW difficulty with bathing/dressing/toileting/self-feeding that is expected to last >3 days?: Yes (Initiates electronic notice to provider for possible OT consult) Does the patient have a NEW difficulty with getting in/out of bed, walking, or climbing stairs that is expected to last >3 days?: Yes (Initiates electronic notice to provider for possible PT consult) Does the patient have a NEW difficulty with communication that is expected to last >3 days?: No Is the patient deaf or have difficulty hearing?: No Does the patient have difficulty seeing, even when wearing glasses/contacts?: No Does the patient have difficulty concentrating, remembering, or making decisions?: No  Permission Sought/Granted                  Emotional Assessment Appearance:: Appears stated age Attitude/Demeanor/Rapport: Engaged Affect (typically observed): Stable, Pleasant Orientation: : Oriented to Situation, Oriented to  Time, Oriented to Place, Oriented to Self Alcohol / Substance Use: Not Applicable Psych Involvement: No (comment)  Admission diagnosis:  Lower extremity edema [R60.0] Hypervolemia, unspecified hypervolemia type [E87.70] Patient Active Problem List   Diagnosis Date Noted  Lower extremity edema 07/04/2024   Acute on chronic diastolic CHF (congestive heart failure) (HCC) 07/04/2024   Aortic valve disorder 05/21/2024   Diastolic dysfunction 05/21/2024   Malnutrition of moderate degree Gregoria: 60% to less than 75% of standard weight) 05/21/2024   Right shoulder pain 04/10/2024   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 02/19/2024   Medication  management 02/19/2024   Presence of retained hardware 02/19/2024   Neurogenic bladder 02/10/2024   Neuropathic pain 02/06/2024   Chronic pain syndrome 01/25/2024   Thoracic myelopathy 01/11/2024   Thoracic spondylosis with myelopathy 01/07/2024   Hepatic cirrhosis (HCC) 11/22/2023   ACS (acute coronary syndrome) (HCC) 04/06/2023   Acute sciatica 04/06/2023   Dyslipidemia 04/06/2023   Hypertensive heart and chronic kidney disease 04/06/2023   Hyponatremia 04/06/2023   Hypothermia 04/06/2023   Renal insufficiency 04/06/2023   SIRS (systemic inflammatory response syndrome) (HCC) 04/06/2023   Slurred speech 04/06/2023   Thrombocytopenia 04/06/2023   Ventricular tachycardia, nonsustained (HCC) 04/06/2023   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   Acute renal failure (ARF) 03/13/2023   Elevated LFTs 03/13/2023   AKI (acute kidney injury) 02/20/2023   Status post thoracic spinal fusion 02/19/2023   Discitis 02/13/2023   Malfunction of spinal cord stimulator 02/13/2023   Serratia 02/13/2023   Diabetes mellitus without complication (HCC) 02/13/2023   Osteomyelitis (HCC) 02/05/2023   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   Infection of spinal cord stimulator 12/07/2022   Lactic acidosis 12/06/2022   Hypomagnesemia 12/06/2022   Anemia of chronic disease 12/06/2022   Diskitis 12/05/2022   Status post insertion of spinal cord stimulator 11/22/2022   Acute thoracic back pain 06/22/2022   Other long term (current) drug therapy 06/22/2022   Lumbar spondylosis 06/22/2022   Thoracic spondylosis 06/22/2022   Bilateral sacroiliitis 06/22/2022   Chronic, continuous use of opioids 05/31/2022   Pain medication agreement 05/02/2022   Postlaminectomy syndrome of lumbar region 05/02/2022   Radiculopathy of thoracolumbar region 05/02/2022   Obesity (BMI 30-39.9) 05/02/2022   Impaired mobility and ADLs 05/02/2022   Mastodynia 04/13/2022   Peripheral artery disease 07/11/2021   COVID-19 virus infection 06/21/2021    Rib pain on left side 06/06/2021   Abdominal pain 06/05/2021   Diabetes 1.5, 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   GAD (generalized anxiety disorder) 11/25/2020   Anxiety and depression 11/03/2020   DM2 (diabetes mellitus, type 2) (HCC)    Restless leg syndrome    PONV (postoperative nausea and vomiting)    Neuropathy    Lumbar stenosis    Left bundle branch block    Hypercholesterolemia    Heart murmur    History of kidney stones    H/O hiatal hernia    Essential hypertension    Dyspnea    Chronic depression    Complication of anesthesia    CKD stage 3b, GFR 30-44 ml/min (HCC)    CAD (coronary artery disease)    Bursitis of right hip    Arthritis    Anxiety    Morbid obesity (HCC) 10/07/2020   Hypoxia 10/07/2020   History of esophageal stricture 10/07/2020   CKD (chronic kidney disease), stage III (HCC) 10/07/2020   Leg weakness 10/07/2020   UTI (urinary tract infection) 10/07/2020   Chronic hypoxemic respiratory failure (HCC) 10/07/2020   Pulmonary hypertension (HCC) 10/06/2020   Chest pain, neg MI, stable CAD non obstructive on cath 10/05/20 10/04/2020   Hardening of the aorta (main artery of the heart) 11/25/2019  Hypertensive heart disease with congestive heart failure and chronic kidney disease (HCC) 11/25/2019   Shortness of breath 06/11/2019   Fatty liver 04/08/2019   Migraine 04/08/2019   History of chronic kidney disease 04/08/2019   Migraine without aura and responsive to treatment 04/08/2019   Acute non-recurrent sinusitis 10/12/2017   Chronic diastolic CHF (congestive heart failure) (HCC) 08/20/2017   Genetic testing 08/17/2017   Family history of ovarian cancer 08/17/2017   Family history of breast cancer 08/17/2017   Cough 04/19/2017   DOE (dyspnea on exertion) 04/19/2017   Type 2 diabetes mellitus with diabetic neuropathy, unspecified (HCC) 01/16/2017   Hypertensive heart disease with heart failure (HCC)  01/01/2017   Hyperlipidemia 01/01/2017   LBBB (left bundle branch block) 01/01/2017   Elevated liver enzymes 12/05/2016   Meralgia paraesthetica 12/05/2016   Tinnitus 12/05/2016   Spinal stenosis of lumbar region with neurogenic claudication 12/05/2016   Wound dehiscence 10/23/2016   S/P lumbar spinal fusion 08/29/2016   OSA (obstructive sleep apnea) 05/24/2016   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   Chronic low back pain without sciatica 03/14/2016   GERD (gastroesophageal reflux disease) 03/14/2016   Gout 03/14/2016   Iron  deficiency anemia due to chronic blood loss 03/14/2016   Type 2 diabetes mellitus without complication, with long-term current use of insulin  (HCC) 03/14/2016   Recurrent major depression in remission 12/07/2015   Restless leg 12/07/2015   Insomnia 12/07/2015   S/P lumbar laminectomy 11/26/2015   Postprocedural state 11/26/2015   Mild CAD 11/24/2015   Lumbar radiculopathy 10/15/2015   History of blood transfusion 2016   Kidney stone 04/20/2014   Stricture of esophagus 04/20/2014   Greater trochanteric bursitis of right hip 02/02/2012   Iliotibial band syndrome of right side 02/02/2012   Iliotibial band syndrome 02/02/2012   Bursitis, trochanteric 02/02/2012   PCP:  Ofilia Lamar CROME, MD Pharmacy:   [facility] [address] [place] - [address] [place] [address] Phone: [phone] Fax: [phone]  [facility] [address] [place] [address] Phone: [phone] Fax: [phone]  [facility] [address] [place] [address] Phone: [phone] Fax: [phone]     Social Drivers of Health (SDOH) Social History: SDOH Screenings   Food Insecurity: No Food Insecurity (07/04/2024)  Housing: Low Risk  (07/04/2024)  Transportation Needs: No Transportation Needs (07/04/2024)  Utilities: Not At Risk (07/04/2024)  Depression  (PHQ2-9): Low Risk  (05/07/2024)  Social Connections: Socially Integrated (07/04/2024)  Tobacco Use: Low Risk  (07/04/2024)   SDOH Interventions:     Readmission Risk Interventions     No data to display

## 2024-07-08 DIAGNOSIS — R600 Localized edema: Secondary | ICD-10-CM | POA: Diagnosis not present

## 2024-07-08 LAB — CBC WITH DIFFERENTIAL/PLATELET
Abs Immature Granulocytes: 0.02 K/uL (ref 0.00–0.07)
Basophils Absolute: 0 K/uL (ref 0.0–0.1)
Basophils Relative: 1 %
Eosinophils Absolute: 0.2 K/uL (ref 0.0–0.5)
Eosinophils Relative: 5 %
HCT: 30.3 % — ABNORMAL LOW (ref 36.0–46.0)
Hemoglobin: 9.8 g/dL — ABNORMAL LOW (ref 12.0–15.0)
Immature Granulocytes: 0 %
Lymphocytes Relative: 11 %
Lymphs Abs: 0.5 K/uL — ABNORMAL LOW (ref 0.7–4.0)
MCH: 28.9 pg (ref 26.0–34.0)
MCHC: 32.3 g/dL (ref 30.0–36.0)
MCV: 89.4 fL (ref 80.0–100.0)
Monocytes Absolute: 0.8 K/uL (ref 0.1–1.0)
Monocytes Relative: 17 %
Neutro Abs: 3.1 K/uL (ref 1.7–7.7)
Neutrophils Relative %: 66 %
Platelets: 151 K/uL (ref 150–400)
RBC: 3.39 MIL/uL — ABNORMAL LOW (ref 3.87–5.11)
RDW: 15.7 % — ABNORMAL HIGH (ref 11.5–15.5)
WBC: 4.7 K/uL (ref 4.0–10.5)
nRBC: 0 % (ref 0.0–0.2)

## 2024-07-08 LAB — BODY FLUID CULTURE W GRAM STAIN
Culture: NO GROWTH
Gram Stain: NONE SEEN

## 2024-07-08 LAB — GLUCOSE, CAPILLARY
Glucose-Capillary: 182 mg/dL — ABNORMAL HIGH (ref 70–99)
Glucose-Capillary: 178 mg/dL — ABNORMAL HIGH (ref 70–99)

## 2024-07-08 LAB — BASIC METABOLIC PANEL WITH GFR
Anion gap: 10 (ref 5–15)
BUN: 29 mg/dL — ABNORMAL HIGH (ref 8–23)
CO2: 27 mmol/L (ref 22–32)
Calcium: 8.4 mg/dL — ABNORMAL LOW (ref 8.9–10.3)
Chloride: 96 mmol/L — ABNORMAL LOW (ref 98–111)
Creatinine, Ser: 1.2 mg/dL — ABNORMAL HIGH (ref 0.44–1.00)
GFR, Estimated: 50 mL/min — ABNORMAL LOW (ref >60)
Glucose, Bld: 194 mg/dL — ABNORMAL HIGH (ref 70–99)
Potassium: 4.2 mmol/L (ref 3.5–5.1)
Sodium: 133 mmol/L — ABNORMAL LOW (ref 135–145)

## 2024-07-08 LAB — MAGNESIUM: Magnesium: 1.7 mg/dL (ref 1.7–2.4)

## 2024-07-08 MED ORDER — OXYCODONE HCL 5 MG PO TABS: 5.0000 mg | ORAL_TABLET | Freq: Four times a day (QID) | ORAL | 0 refills | Status: AC | PRN

## 2024-07-08 MED ORDER — MIRTAZAPINE 30 MG PO TABS: 30.0000 mg | ORAL_TABLET | Freq: Every day | ORAL | 0 refills | Status: AC

## 2024-07-08 NOTE — Plan of Care (Signed)

## 2024-07-08 NOTE — TOC Transition Note (Signed)
 Transition of Care [facility]) - Discharge Note   Patient Details  Name: Katelyn [name] MRN: [id number] Date of Birth: [birth year]  Transition of Care [facility]) CM/SW Contact:  Waddell Barnie Rama, RN Phone Number: 07/08/2024, 3:05 PM   Clinical Narrative:    For dc today, she is set up with [facility].  She has transportation at [employer].     Barriers to Discharge: Continued Medical Work up   Patient Goals and CMS Choice Patient states their goals for this hospitalization and ongoing recovery are:: To go home          Discharge Placement                       Discharge Plan and Services Additional resources added to the After Visit Summary for   In-house Referral: Clinical Social Work Discharge Planning Services: CM Consult                                 Social Drivers of Health (SDOH) Interventions SDOH Screenings   Food Insecurity: No Food Insecurity (07/04/2024)  Housing: Low Risk  (07/04/2024)  Transportation Needs: No Transportation Needs (07/04/2024)  Utilities: Not At Risk (07/04/2024)  Depression (PHQ2-9): Low Risk  (05/07/2024)  Social Connections: Socially Integrated (07/04/2024)  Tobacco Use: Low Risk  (07/04/2024)     Readmission Risk Interventions     No data to display

## 2024-07-08 NOTE — Discharge Summary (Signed)
 Physician Discharge Summary  Katelyn [name] FMW:[id number] DOB: December 27, 1955 DOA: 07/03/2024  PCP: Katelyn Lamar CROME, MD  Admit date: 07/03/2024 Discharge date: 07/08/2024  Admitted from: Home Discharge disposition: Home with home with PT, OT  Recommendations at discharge:  Continue long-term antibiotic plan with ciprofloxacin  and doxycycline  as before Continue carvedilol  and torsemide  as before Encourage wrapping of both legs with Ace bandages during the daytime.  Can take them off at night. Continue to work with physical therapy and outpatient therapy with home health Continue to follow-up with orthopedics as an outpatient.  Brief narrative: Katelyn [name] is a 68 y.o. 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 Bedbound for last few months since the surgery. She has also had complaints of right shoulder pain outpatient and workup has been felt to be consistent with avascular necrosis with flattened humeral head.  However there has been some concern for potential infection but no plans for surgery during recent evaluations.  9/25, patient presented to ED with complaint of progressively worsening bilateral leg swelling, shortness of breath  In the ED, hemodynamically stable CT angio chest did not show pulm embolism but showed a large, heterogeneous mass-like area 7 cm in length engulfing the right shoulder, with interval bone destruction involving the glenohumeral area and probable surgical absence of the humeral head and neck, findings concerning for a large abscess/granulomatous process and possible hematoma.   Admitted to [facility] 9/26, successfully located right shoulder aspiration by IR.  Obtained 25 mL of serosanguineous  fluid, sent to lab.  Subjective: Patient was seen and examined this a.m. Sitting up in her chair.  Not in distress. Husband at bedside. Ace wrap around both legs. Afebrile, hemodynamically stable, breathing room air Labs from this morning with slight rise in creatinine to 1.2, WC count remains normal otherwise stable. Pending insurance authorization for SNF. This afternoon, patient has decided to not go to SNF and to go home with home health PT/OT.  Hospital course: Right shoulder abscess CT findings as above 9/26, successfully located right shoulder aspiration by IR.  Obtained 25 mL of serosanguineous fluid, sent for culture. ID and orthopedics consult appreciated. Prior to admission, patient was on chronic suppressive antibiotic treatment with doxycycline  and ciprofloxacin . Per ID recommendation, antibiotic regimen was upgraded to IV cefepime /daptomycin  per ID recommendation.  However, over the next 48 hours, patient remained stable without fever, leukocytosis.  Right shoulder aspiration fluid culture did not show any growth.   9/29, ID switched back the antibiotics to previous regimen oral Ceftin and oral ciprofloxacin .   Per orthopedics, no need of inpatient intervention on the right shoulder. Patient to continue to follow-up [name] as before  Patient follows up with pain management specialist as an outpatient.  On as needed oxycodone  and Voltaren  gel.  h/o thoracic vertebral discitis/osteomyelitis  s/p surgical removal of hardware  Antibiotic plan as above.  Bilateral lower extremity edema Presented with lower extremity edema, BNP mildly elevated. Most recent June 2025 showed EF 45 to 50%, no RWMA, grade 1 DD Follow-up repeat echo this admission showed an improved EF to 60 to 65%, no WMA, mild LVH Ultrasound duplex negative for DVT. Initially suspected to have CHF exacerbation and was diuresed with IV Lasix  which was later stopped because of elevated creatinine.   PTA meds-  Coreg  6.25 mg twice daily, torsemide  20 mg daily Currently not on both. Hemodynamically stable.  Ace wraps around bilateral lower extremity swelling.  Hypomagnesemia Magnesium  level improved with replacement Recent Labs  Lab 07/03/24 2302 07/05/24 0233 07/06/24 0644 07/07/24 0735 07/08/24 0216  K 3.8 3.9 3.8 4.4 4.2  MG  --  1.5* 1.5* 2.0 1.7   H/o CAD Not on any antiplatelet or statin??  Probably because of liver cirrhosis  NASH cirrhosis Has chronic bilateral lower extremity edema probably worsened due to bedbound status.  Ultrasound duplex negative for DVT No evidence of ascites currently. Diuretics plan as above Recent Labs  Lab 07/03/24 2302 07/05/24 0233 07/06/24 0644 07/07/24 0735 07/08/24 0216  AST 47*  --   --   --   --   ALT 32  --   --   --   --   ALKPHOS 269*  --   --   --   --   BILITOT 0.9  --   --   --   --   PROT 6.3*  --   --   --   --   ALBUMIN  2.6*  --   --   --   --   PLT 174 167 139* 119* 151   Thrombocytopenia Mild, in the setting of liver cirrhosis. Platelet count was initially declining, improved after heparin  product was stopped. Platelet count improved to normal now.  Type 2 diabetes mellitus A1c 6.4 on 07/03/2024 PTA meds-Tresiba  40 units daily, Mounjaro weekly, Premeal NovoLog  3 times daily. Continue the same regimen at home. Recent Labs  Lab 07/07/24 1145 07/07/24 1631 07/07/24 2130 07/08/24 0626 07/08/24 1125  GLUCAP 176* 175* 175* 182* 178*   CKD 3B Creatinine slightly elevated with diuresis.  Continue to monitor  Recent Labs    01/25/24 0400 01/28/24 0512 03/17/24 1405 04/10/24 0903 05/21/24 1213 07/03/24 2302 07/05/24 0233 07/06/24 0644 07/07/24 0735 07/08/24 0216  BUN 29* 25* 34* 24 24 23  25* 30* 25* 29*  CREATININE 1.45* 1.37* 1.18* 1.08* 1.06* 1.06* 1.37* 1.27* 1.07* 1.20*  CO2 28 28 32 33* 27 28 29 29 25 27    Hyponatremia Mild.  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  Not sure if it has any role on her current symptoms Follows up with pulmonologist Dr. Shellia as an outpatient  Mild chronic anemia Esophagitis Baseline hemoglobin above 10.  No active bleeding. Continue to monitor.  Continue PPI.  Follows up with GI as an outpatient Recent Labs    07/03/24 2302 07/05/24 0233 07/06/24 0644 07/07/24 0735 07/08/24 0216  HGB 10.1* 9.4* 8.7* 9.6* 9.8*  MCV 92.2 91.5 89.9 89.7 89.4   Obesity 2 Body mass index is 37.9 kg/m. Patient has been advised to make an attempt to improve diet and exercise patterns to aid in weight loss.  Anxiety and depression Continue Cymbalta  and mirtazapine     Restless leg syndrome Continue Mirapex , Lyrica  75 mg 3 times daily  OSA (obstructive sleep apnea) continue CPAP    Impaired mobility and ADLs Bedbound for last few months since vertebral surgery. Has some strength on the both distal muscles but unable to support her weight.  At home, she was using upper body strength to transfer still out of bed but with the right shoulder condition, unable to do that.   PT/OT eval obtained.  SNF  recommended.  But this afternoon, patient decided to go home with home health PT/OT.  Goals of care   Code Status: Limited: Do not attempt resuscitation (DNR) -DNR-LIMITED -Do Not Intubate/DNI    Diet:  Diet Order             Diet Carb Modified           Diet - low sodium heart healthy           Diet Carb Modified Fluid consistency: Thin; Room service appropriate? Yes  Diet effective now                   Nutritional status:  Body mass index is 37.9 kg/m.       Wounds:  -    Discharge Medications:   Allergies as of 07/08/2024       Reactions   Atorvastatin Calcium     Other Reaction(s): Not available atorvastatin calcium    Oxycodone  Other (See Comments)   Confusion, staring episodes States she is not  allergic.    Atorvastatin Nausea And Vomiting, Other (See Comments)   MYALGIAS   Pentazocine Other (See Comments)   headache Other Reaction(s): Not available pentazocine   Cefepime  Other (See Comments)   9/29 - had confusion again with retrial of cefepime   Presumed AMS/neurotoxicity in the setting of AKI cefepime    Gabapentin  Swelling   Other Nausea Only   UNSPECIFIED Anesthesia        Medication List     STOP taking these medications    HYDROcodone -acetaminophen  5-325 MG tablet Commonly known as: NORCO/VICODIN       TAKE these medications    carvedilol  6.25 MG tablet Commonly known as: COREG  Take 1 tablet (6.25 mg total) by mouth 2 (two) times daily with a meal.   ciprofloxacin  500 MG tablet Commonly known as: CIPRO  Take 1 tablet (500 mg total) by mouth 2 (two) times daily.   diclofenac  Sodium 1 % Gel Commonly known as: VOLTAREN  Apply 2 g topically 4 (four) times daily. What changed:  when to take this reasons to take this   doxycycline  100 MG tablet Commonly known as: VIBRA -TABS Take 1 tablet (100 mg total) by mouth 2 (two) times daily.   DULoxetine  60 MG capsule Commonly known as: CYMBALTA  Take 1 capsule (60 mg total) by mouth 2 (two) times daily.   Hair Skin and Nails Formula Tabs Take 1 tablet by mouth daily.   melatonin 5 MG Tabs Take 1 tablet (5 mg total) by mouth at bedtime as needed.   methocarbamol  500 MG tablet Commonly known as: ROBAXIN  Take 500 mg by mouth at bedtime.   mirabegron  ER 25 MG Tb24 tablet Commonly known as: MYRBETRIQ  Take 1 tablet (25 mg total) by mouth daily for 14 days, THEN 2 tablets (50 mg total) daily for 16 days. If you do not pee for > 6 hours, stop medication and call your physician. Start taking on: May 07, 2024 What changed: See the new instructions.   mirtazapine  30 MG tablet Commonly known as: REMERON  Take 1 tablet (30 mg total) by mouth at bedtime.   multivitamin with minerals Tabs tablet Take 1 tablet  by mouth daily.   NERVIVE ROLL-ON EX Apply 1 Application topically as needed (Pain).   NovoLOG  ReliOn 100 UNIT/ML injection Generic drug: insulin  aspart Inject 2-20 Units into the skin 3 (three) times daily with meals.   oxyCODONE  5 MG immediate release tablet Commonly known as: Oxy IR/ROXICODONE  Take 1 tablet (5 mg  total) by mouth every 6 (six) hours as needed for moderate pain (pain score 4-6). What changed: when to take this   pantoprazole  40 MG tablet Commonly known as: PROTONIX  Take 1 tablet by mouth once daily   polyethylene glycol powder 17 GM/SCOOP powder Commonly known as: GLYCOLAX /MIRALAX  Take 17 g by mouth daily. What changed:  when to take this reasons to take this   pramipexole  1 MG tablet Commonly known as: MIRAPEX  Take 1 mg by mouth at bedtime.   pregabalin  75 MG capsule Commonly known as: LYRICA  Take 1 capsule (75 mg total) by mouth 3 (three) times daily.   Senna-S 8.6-50 MG tablet Generic drug: senna-docusate Take 2 tablets by mouth 2 (two) times daily.   tirzepatide 5 MG/0.5ML Pen Commonly known as: MOUNJARO Inject 7 mg into the skin once a week.   torsemide  20 MG tablet Commonly known as: DEMADEX  Take 1 tablet by mouth twice daily   Tresiba  FlexTouch 200 UNIT/ML FlexTouch Pen Generic drug: insulin  degludec Inject 40 Units into the skin daily with breakfast. What changed: when to take this         Follow ups:    Contact information for follow-up providers     Dough, Lamar CROME, MD Follow up.   Specialty: Family Medicine Contact information: [address] [place] [address] [phone]         [facility] Follow up.   Specialty: Home Health Services Why: Agency will call you to set up apt  times Contact information: [address] [place] [address] [phone]         Melita Drivers, MD Follow up.   Specialty: Orthopedic Surgery Contact information: [address] [place]  [address] [place] [address] [phone]              Contact information for after-discharge care     Destination     [facility]   Service: Skilled Nursing Contact information: [address] [place]  [phone]                     Discharge Instructions:   Discharge Instructions     Call MD for:  difficulty breathing, headache or visual disturbances   Complete by: As directed    Call MD for:  extreme fatigue   Complete by: As directed    Call MD for:  hives   Complete by: As directed    Call MD for:  persistant dizziness or light-headedness   Complete by: As directed    Call MD for:  persistant nausea and vomiting   Complete by: As directed    Call MD for:  severe uncontrolled pain   Complete by: As directed    Call MD for:  temperature >100.4   Complete by: As directed    Diet - low sodium heart healthy   Complete by: As directed    Diet Carb Modified   Complete by: As directed    Discharge instructions   Complete by: As directed    Recommendations at discharge:   Continue long-term antibiotic plan with ciprofloxacin  and doxycycline  as before  Continue carvedilol  and torsemide  as before  Encourage wrapping of both legs with Ace bandages during the daytime.  Can take them off at night.  Continue to work with physical therapy and outpatient therapy with home health  Continue to follow-up with orthopedics as an outpatient.  General discharge instructions: Follow with Primary MD Dough,  Lamar CROME, MD in 7 days  Please request your PCP  to go over your hospital tests, procedures, radiology results at the follow up. Please get your medicines reviewed and adjusted.  Your PCP may decide to repeat certain labs or tests as needed. Do not drive, operate heavy machinery, perform activities at heights, swimming or participation in water activities or provide baby sitting services if your were admitted for  syncope or siezures until you have seen by Primary MD or a Neurologist and advised to do so again. [place]  Controlled Substance Reporting System database was reviewed. Do not drive, operate heavy machinery, perform activities at heights, swim, participate in water activities or provide baby-sitting services while on medications for pain, sleep and mood until your outpatient physician has reevaluated you and advised to do so again.  You are strongly recommended to comply with the dose, frequency and duration of prescribed medications. Activity: As tolerated with Full fall precautions use walker/cane & assistance as needed Avoid using any recreational substances like cigarette, tobacco, alcohol, or non-prescribed drug. If you experience worsening of your admission symptoms, develop shortness of breath, life threatening emergency, suicidal or homicidal thoughts you must seek medical attention immediately by calling 911 or calling your MD immediately  if symptoms less severe. You must read complete instructions/literature along with all the possible adverse reactions/side effects for all the medicines you take and that have been prescribed to you. Take any new medicine only after you have completely understood and accepted all the possible adverse reactions/side effects.  Wear Seat belts while driving. You were cared for by a hospitalist during your hospital stay. If you have any questions about your discharge medications or the care you received while you were in the hospital after you are discharged, you can call the unit and ask to speak with the hospitalist or the covering physician. Once you are discharged, your primary care physician will handle any further medical issues. Please note that NO REFILLS for any discharge medications will be authorized once you are discharged, as it is imperative that you return to your primary care physician (or establish a relationship with a primary care physician if  you do not have one).   Increase activity slowly   Complete by: As directed        Discharge Exam:   Vitals:   07/08/24 0010 07/08/24 0744 07/08/24 0800 07/08/24 1128  BP: (!) 119/52 98/85 134/64 (!) 105/57  Pulse: 90 95 72 81  Resp: 20 20  20   Temp: 98.1 F (36.7 C) 98.4 F (36.9 C)  98.2 F (36.8 C)  TempSrc: Oral Oral  Oral  SpO2: 96% 96%  98%  Weight:      Height:        Body mass index is 37.9 kg/m.   General exam: Pleasant, elderly Caucasian female. Skin: No rashes, lesions or ulcers. HEENT: Atraumatic, normocephalic, no obvious bleeding Lungs: Clear to auscultation bilaterally,  CVS: S1, S2, no murmur,   GI/Abd: Soft, nontender, nondistended, bowel sound present,   CNS: Alert, awake, oriented x 3.  2/5 strength on wiggling both feet toes but unable to lift up legs Psychiatry: Mood appropriate Extremities: Persistent bilateral pitting 1+ pedal edema, no calf tenderness,    The results of significant diagnostics from this hospitalization (including imaging, microbiology, ancillary and laboratory) are listed below for reference.    Procedures and Diagnostic Studies:   IR ARTHRO ASP OR INJ MAJOR JOINT OR BURSA Result Date: 07/04/2024 INDICATION: 68 year old female  with right shoulder pain, concern for avascular necrosis of right humeral head and neck, with possible fluid collection at site of the shoulder joint. IR was requested for possible aspiration. EXAM: ARTHROCENTESIS/INJECTION OF LARGE JOINT COMPARISON:  CTA chest 07/04/2024. CONTRAST:  None. FLUOROSCOPY: Radiology Exposure Index: 7.9 MGy Kerma COMPLICATIONS: None immediate PROCEDURE: Informed written consent was obtained from the patient after discussion of the risks, benefits and alternatives to treatment. The patient was placed supine on the fluoroscopy table. Upon review of patient's recent chest CT, it was noted that the largest fluid pocket over the right glenohumeral joint was immediately distal to the  visualized humeral head. The area was localized with fluoroscopy. The skin overlying the anterior aspect of the right shoulder was prepped and draped in usual sterile fashion. Approximately 15 mL of 1% lidocaine  was used to anesthetize the skin. A 20 gauge spinal needle was advanced into the previously identified potential fluid collection medial and posterior to the to the current edge of the right humeral neck remnant as seen on AP imaging, and an image was saved for documentation purposes. Approximally 25 mL of serosanguineous fluid was aspirated. The syringes were capped and sent to the laboratory for analysis as ordered by the clinical team. The needle was removed and a dressing was placed. The patient tolerated procedure well without immediate postprocedural complication. IMPRESSION: Successful fluoroscopic guided aspiration of the right shoulder joint. Procedure performed by Carlin Griffon, PA-C Electronically Signed   By: Toribio Agreste M.D.   On: 07/04/2024 16:50   ECHOCARDIOGRAM LIMITED Result Date: 07/04/2024    ECHOCARDIOGRAM LIMITED REPORT   Patient Name:   Katelyn [name] Benoist Date of Exam: 07/04/2024 Medical Rec #:  [id number]   Height:       62.0 in Accession #:    [id number]  Weight:       200.0 lb Date of Birth:  [birth year]   BSA:          1.912 m Patient Age:    68 years    BP:           123/47 mmHg Patient Gender: F           HR:           99 bpm. Exam Location:  Inpatient Procedure: Limited Echo, Limited Color Doppler and Cardiac Doppler (Both            Spectral and Color Flow Doppler were utilized during procedure). Indications:    dyspnea  History:        Patient has prior history of Echocardiogram examinations, most                 recent 04/04/2024. CHF, CAD, chronic kidney disease,                 Arrythmias:LBBB, Signs/Symptoms:Edema and Murmur; Risk                 Factors:Dyslipidemia, Sleep Apnea and Diabetes.  Sonographer:    Tinnie Barefoot RDCS Referring Phys: [phone] DAVID GIRGUIS IMPRESSIONS   1. Left ventricular ejection fraction, by estimation, is 60 to 65%. The left ventricle has normal function. The left ventricle has no regional wall motion abnormalities. There is mild left ventricular hypertrophy.  2. Right ventricular systolic function is normal. The right ventricular size is normal.  3. The mitral valve is normal in structure. No evidence of mitral valve regurgitation. No evidence of mitral stenosis.  4. The aortic valve is calcified. There is mild  calcification of the aortic valve. Aortic valve regurgitation is not visualized. Aortic valve sclerosis is present, with no evidence of aortic valve stenosis.  5. The inferior vena cava is normal in size with greater than 50% respiratory variability, suggesting right atrial pressure of 3 mmHg. FINDINGS  Left Ventricle: Left ventricular ejection fraction, by estimation, is 60 to 65%. The left ventricle has normal function. The left ventricle has no regional wall motion abnormalities. The left ventricular internal cavity size was normal in size. There is  mild left ventricular hypertrophy. Right Ventricle: The right ventricular size is normal. No increase in right ventricular wall thickness. Right ventricular systolic function is normal. Left Atrium: Left atrial size was normal in size. Right Atrium: Right atrial size was normal in size. Pericardium: There is no evidence of pericardial effusion. Mitral Valve: The mitral valve is normal in structure. No evidence of mitral valve stenosis. Tricuspid Valve: The tricuspid valve is normal in structure. Tricuspid valve regurgitation is not demonstrated. No evidence of tricuspid stenosis. Aortic Valve: The aortic valve is calcified. There is mild calcification of the aortic valve. Aortic valve regurgitation is not visualized. Aortic valve sclerosis is present, with no evidence of aortic valve stenosis. Pulmonic Valve: The pulmonic valve was normal in structure. Pulmonic valve regurgitation is not visualized. No  evidence of pulmonic stenosis. Aorta: The aortic root is normal in size and structure. Venous: The inferior vena cava is normal in size with greater than 50% respiratory variability, suggesting right atrial pressure of 3 mmHg. IAS/Shunts: No atrial level shunt detected by color flow Doppler. Additional Comments: Spectral Doppler performed. Color Doppler performed.  LEFT VENTRICLE PLAX 2D LVIDd:         4.60 cm LVIDs:         3.60 cm LV PW:         1.20 cm LV IVS:        1.20 cm  LV Volumes (MOD) LV vol d, MOD A2C: 71.6 ml LV vol s, MOD A2C: 41.5 ml LV SV MOD A2C:     30.1 ml IVC IVC diam: 1.80 cm LEFT ATRIUM         Index LA diam:    4.80 cm 2.51 cm/m  AORTIC VALVE LVOT Vmax:   120.00 cm/s LVOT Vmean:  75.000 cm/s LVOT VTI:    0.192 m  SHUNTS Systemic VTI: 0.19 m Oneil Parchment MD Electronically signed by Oneil Parchment MD Signature Date/Time: 07/04/2024/3:06:25 PM    Final    CT Angio Chest Pulmonary Embolism (PE) W or WO Contrast Result Date: 07/04/2024 CLINICAL DATA:  Shortness of breath. Decreased mobility. Intermittent bilateral leg swelling prompting a recent increase in diuretic dosage. History of discitis and right shoulder pain. EXAM: CT ANGIOGRAPHY CHEST WITH CONTRAST TECHNIQUE: Multidetector CT imaging of the chest was performed using the standard protocol during bolus administration of intravenous contrast. Multiplanar CT image reconstructions and MIPs were obtained to evaluate the vascular anatomy. RADIATION DOSE REDUCTION: This exam was performed according to the departmental dose-optimization program which includes automated exposure control, adjustment of the mA and/or kV according to patient size and/or use of iterative reconstruction technique. CONTRAST:  75mL OMNIPAQUE  IOHEXOL  350 MG/ML SOLN COMPARISON:  Chest radiographs dated 07/04/2024 and 01/17/2023. Chest CT dated 06/13/2019. Right shoulder CT dated 04/09/20251 FINDINGS: Cardiovascular: Normally opacified pulmonary arteries with no pulmonary  arterial filling defects seen. Enlarged heart. No pericardial effusion. Minimal atheromatous aortic and coronary artery calcifications. Mediastinum/Nodes: There is moderate flattening of the trachea and proximal  mainstem bronchi. Portions of the esophagus are filled with air and demonstrate mild-to-moderate wall thickening. Unremarkable thyroid  gland. No enlarged lymph nodes. Lungs/Pleura: Prominent pulmonary vasculature and mild heterogeneous ground-glass opacities throughout both lungs. Mild peripheral bullous changes in the lower lobes. No airspace consolidation or pleural fluid. Upper Abdomen: Unremarkable. Musculoskeletal: Interval large, heterogeneous mass-like area engulfing the right shoulder, not included in its entirety. The included portion measures 6.1 x 5.3 cm in maximum diameter on image number 58/7, including some muscle that is indistinguishable from the mass with no separating fat plane. This is partially included in the coronal plane, measuring more than 7 cm in length. The mass-like area has intermediate and low-density components. There is interval bone destruction involving the glenohumeral area and probable surgical absence of the humeral head and neck. Extensive thoracic and lower cervical spine degenerative changes. Lower thoracic spine fixation hardware and screw tracts and previously demonstrated erosive changes centered at the T9-10 level. Review of the MIP images confirms the above findings. IMPRESSION: 1. No pulmonary emboli. 2. Cardiomegaly and mild changes of congestive heart failure. 3. Interval large, heterogeneous mass-like area engulfing the right shoulder, not included in its entirety and measuring more than 7 cm in length. There is interval bone destruction involving the glenohumeral area and probable surgical absence of the humeral head and neck. This is concerning for a large abscess/granulomatous process and possible hematoma. Neoplasm is less likely due to the rapid  progression of the findings. 4. Moderate flattening of the trachea and proximal mainstem bronchi, compatible with tracheobronchomalacia. 5. Mild-to-moderate esophageal wall thickening, suggesting esophagitis. 6. Mild changes of COPD. 7. Minimal atheromatous aortic and coronary artery atheromatous calcifications. Aortic Atherosclerosis (ICD10-I70.0) and Emphysema (ICD10-J43.9). Electronically Signed   By: Elspeth Bathe M.D.   On: 07/04/2024 10:47   VAS US  LOWER EXTREMITY VENOUS (DVT) Result Date: 07/04/2024  Lower Venous DVT Study Patient Name:  CAMDEN MAZZAFERRO Kagan  Date of Exam:   07/04/2024 Medical Rec #: [id number]    Accession #:    [id number] Date of Birth: 1955/11/21    Patient Gender: F Patient Age:   88 years Exam Location:  [facility] Procedure:      VAS US  LOWER EXTREMITY VENOUS (DVT) Referring Phys: ALM APO --------------------------------------------------------------------------------  Indications: Edema.  Limitations: Body habitus and poor ultrasound/tissue interface. Comparison Study: Previous exam on 01/13/2024 was negative for DVT Performing Technologist: Ezzie Potters RVT, RDMS  Examination Guidelines: A complete evaluation includes B-mode imaging, spectral Doppler, color Doppler, and power Doppler as needed of all accessible portions of each vessel. Bilateral testing is considered an integral part of a complete examination. Limited examinations for reoccurring indications may be performed as noted. The reflux portion of the exam is performed with the patient in reverse Trendelenburg.  +---------+---------------+---------+-----------+----------+-------------------+ RIGHT    CompressibilityPhasicitySpontaneityPropertiesThrombus Aging      +---------+---------------+---------+-----------+----------+-------------------+ CFV      Full           Yes      Yes                                      +---------+---------------+---------+-----------+----------+-------------------+ SFJ      Full                                                              +---------+---------------+---------+-----------+----------+-------------------+  FV Prox  Full           Yes      Yes                                      +---------+---------------+---------+-----------+----------+-------------------+ FV Mid   Full           Yes      Yes                                      +---------+---------------+---------+-----------+----------+-------------------+ FV DistalFull           Yes      Yes                                      +---------+---------------+---------+-----------+----------+-------------------+ PFV      Full                                                             +---------+---------------+---------+-----------+----------+-------------------+ POP      Full           Yes      Yes                                      +---------+---------------+---------+-----------+----------+-------------------+ PTV      Full                                         Not well visualized +---------+---------------+---------+-----------+----------+-------------------+ PERO     Full                                         Not well visualized +---------+---------------+---------+-----------+----------+-------------------+   +---------+---------------+---------+-----------+----------+-------------------+ LEFT     CompressibilityPhasicitySpontaneityPropertiesThrombus Aging      +---------+---------------+---------+-----------+----------+-------------------+ CFV      Full           Yes      Yes                                      +---------+---------------+---------+-----------+----------+-------------------+ SFJ      Full                                                             +---------+---------------+---------+-----------+----------+-------------------+ FV Prox  Full           Yes      Yes                                       +---------+---------------+---------+-----------+----------+-------------------+  FV Mid   Full           Yes      Yes                                      +---------+---------------+---------+-----------+----------+-------------------+ FV DistalFull           Yes      Yes                                      +---------+---------------+---------+-----------+----------+-------------------+ PFV      Full                                                             +---------+---------------+---------+-----------+----------+-------------------+ POP      Full           Yes      Yes                                      +---------+---------------+---------+-----------+----------+-------------------+ PTV      Full                                         Not well visualized +---------+---------------+---------+-----------+----------+-------------------+ PERO     Full                                         Not well visualized +---------+---------------+---------+-----------+----------+-------------------+     Summary: BILATERAL: - No evidence of deep vein thrombosis seen in the lower extremities, bilaterally. -No evidence of popliteal cyst, bilaterally. -Diffuse subcutaneous edema, bilaterally.   *See table(s) above for measurements and observations. Electronically signed by Lonni Gaskins MD on 07/04/2024 at 9:50:16 AM.    Final    DG Chest 2 View Result Date: 07/04/2024 EXAM: 2 VIEW(S) XRAY OF THE CHEST 07/04/2024 04:19:17 AM COMPARISON: 08/02/2023 CLINICAL HISTORY: SHOB. FINDINGS: LUNGS AND PLEURA: Low lung volumes. Prominent perihilar vasculature likely due to vascular crowding. No focal pulmonary opacity. No pulmonary edema. No pleural effusion. No pneumothorax. HEART AND MEDIASTINUM: Thoracic neurostimulator noted. No acute abnormality of the cardiac and mediastinal silhouettes. BONES AND SOFT TISSUES: Thoracolumbar surgical hardware noted. No acute osseous abnormality.  IMPRESSION: 1. No acute cardiopulmonary process. 2. Low lung volumes with prominent perihilar vasculature, likely due to vascular crowding. Electronically signed by: Waddell Calk MD 07/04/2024 05:38 AM EDT RP Workstation: [id number]     Labs:   Basic Metabolic Panel: Recent Labs  Lab 07/03/24 2302 07/05/24 0233 07/06/24 0644 07/07/24 0735 07/08/24 0216  NA 140 138 137 131* 133*  K 3.8 3.9 3.8 4.4 4.2  CL 102 98 97* 97* 96*  CO2 28 29 29 25 27   GLUCOSE 144* 127* 132* 179* 194*  BUN 23 25* 30* 25* 29*  CREATININE 1.06* 1.37* 1.27* 1.07* 1.20*  CALCIUM  8.4* 8.5* 8.4* 8.4* 8.4*  MG  --  1.5* 1.5*  2.0 1.7   GFR Estimated Creatinine Clearance: 48.6 mL/min (A) (by C-G formula based on SCr of 1.2 mg/dL (H)). Liver Function Tests: Recent Labs  Lab 07/03/24 2302  AST 47*  ALT 32  ALKPHOS 269*  BILITOT 0.9  PROT 6.3*  ALBUMIN  2.6*   No results for input(s): LIPASE, AMYLASE in the last 168 hours. No results for input(s): AMMONIA in the last 168 hours. Coagulation profile No results for input(s): INR, PROTIME in the last 168 hours.  CBC: Recent Labs  Lab 07/03/24 2302 07/05/24 0233 07/06/24 0644 07/07/24 0735 07/08/24 0216  WBC 6.8 4.7 4.0 3.8* 4.7  NEUTROABS 4.1 2.6 3.0 2.9 3.1  HGB 10.1* 9.4* 8.7* 9.6* 9.8*  HCT 31.8* 29.2* 26.7* 29.5* 30.3*  MCV 92.2 91.5 89.9 89.7 89.4  PLT 174 167 139* 119* 151   Cardiac Enzymes: Recent Labs  Lab 07/06/24 0644  CKTOTAL 37*   BNP: Invalid input(s): POCBNP CBG: Recent Labs  Lab 07/07/24 1145 07/07/24 1631 07/07/24 2130 07/08/24 0626 07/08/24 1125  GLUCAP 176* 175* 175* 182* 178*   D-Dimer No results for input(s): DDIMER in the last 72 hours. Hgb A1c No results for input(s): HGBA1C in the last 72 hours. Lipid Profile No results for input(s): CHOL, HDL, LDLCALC, TRIG, CHOLHDL, LDLDIRECT in the last 72 hours. Thyroid  function studies No results for input(s): TSH, T4TOTAL, T3FREE,  THYROIDAB in the last 72 hours.  Invalid input(s): FREET3 Anemia work up No results for input(s): VITAMINB12, FOLATE, FERRITIN, TIBC, IRON , RETICCTPCT in the last 72 hours. Microbiology Recent Results (from the past 240 hours)  Urine Culture     Status: Abnormal   Collection Time: 07/02/24  4:17 PM   Specimen: Urine, Clean Catch  Result Value Ref Range Status   Specimen Description URINE, CLEAN CATCH  Final   Special Requests NONE  Final   Culture (A)  Final    <10,000 COLONIES/mL INSIGNIFICANT GROWTH Performed at [facility], [address] [place], [place] [address]    Report Status 07/03/2024 FINAL  Final  Body fluid culture w Gram Stain     Status: None   Collection Time: 07/04/24  4:22 PM   Specimen: SHOULDER; Body Fluid  Result Value Ref Range Status   Specimen Description SHOULDER  Final   Special Requests NONE  Final   Gram Stain NO WBC SEEN NO ORGANISMS SEEN   Final   Culture   Final    NO GROWTH 3 DAYS Performed at [facility], [address], [place], [place] [address]    Report Status 07/08/2024 FINAL  Final  Culture, blood (Routine X 2) w Reflex to ID Panel     Status: None (Preliminary result)   Collection Time: 07/05/24  3:53 PM   Specimen: BLOOD LEFT ARM  Result Value Ref Range Status   Specimen Description BLOOD LEFT ARM  Final   Special Requests   Final    BOTTLES DRAWN AEROBIC AND ANAEROBIC Blood Culture results may not be optimal due to an inadequate volume of blood received in culture bottles   Culture   Final    NO GROWTH 3 DAYS Performed at [facility], [address] [place], [place] [address]    Report Status PENDING  Incomplete  Culture, blood (Routine X 2) w Reflex to ID Panel     Status: None (Preliminary result)   Collection Time: 07/05/24  3:53 PM   Specimen: BLOOD LEFT HAND  Result Value Ref Range  Status   Specimen Description BLOOD LEFT HAND  Final   Special Requests   Final    BOTTLES DRAWN  AEROBIC AND ANAEROBIC Blood Culture results may not be optimal due to an inadequate volume of blood received in culture bottles   Culture   Final    NO GROWTH 3 DAYS Performed at [facility], [address], [place], [place] [address]    Report Status PENDING  Incomplete    Time coordinating discharge: 45 minutes  Signed: Jayzon Taras  [facility] 07/08/2024, 3:04 PM

## 2024-07-08 NOTE — Progress Notes (Signed)
 PROGRESS NOTE  Katelyn [name]  DOB: November 20, 1955  PCP: Ofilia Lamar CROME, MD FMW:[id number]  DOA: 07/03/2024  LOS: 4 days  Hospital Day: 6  Brief narrative: Katelyn [name] is a 68 y.o. 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 Bedbound for last few months since the surgery. She has also had complaints of right shoulder pain outpatient and workup has been felt to be consistent with avascular necrosis with flattened humeral head.  However there has been some concern for potential infection but no plans for surgery during recent evaluations.  9/25, patient presented to ED with complaint of progressively worsening bilateral leg swelling, shortness of breath  In the ED, hemodynamically stable CT angio chest did not show pulm embolism but showed a large, heterogeneous mass-like area 7 cm in length engulfing the right shoulder, with interval bone destruction involving the glenohumeral area and probable surgical absence of the humeral head and neck, findings concerning for a large abscess/granulomatous process and possible hematoma.   Admitted to [facility] 9/26, successfully located right shoulder aspiration by IR.  Obtained 25 mL of serosanguineous fluid, sent to lab.  Subjective: Patient was seen and examined this a.m. Sitting up in her chair.  Not in distress. Husband at bedside. Ace wrap around both legs. Afebrile, hemodynamically stable, breathing room air Labs from this morning with slight rise in creatinine to 1.2, WC count remains normal otherwise stable. Pending insurance authorization for SNF  Assessment and plan: Right shoulder abscess CT findings as above 9/26, successfully located right shoulder aspiration by IR.   Obtained 25 mL of serosanguineous fluid, sent for culture. ID and orthopedics consult appreciated. Prior to admission, patient was on chronic suppressive antibiotic treatment with doxycycline  and ciprofloxacin . Per ID recommendation, antibiotic regimen was upgraded to IV cefepime /daptomycin  per ID recommendation.  However, over the next 48 hours, patient remained stable without fever, leukocytosis.  Right shoulder aspiration fluid culture did not show any growth.   9/29, ID switched back the antibiotics to previous regimen oral Ceftin and oral ciprofloxacin .   Per orthopedics, no need of inpatient intervention on the right shoulder. Patient to continue to follow-up [name] as before  Patient follows up with pain management specialist as an outpatient.  On as needed oxycodone  and Voltaren  gel.  h/o thoracic vertebral discitis/osteomyelitis  s/p surgical removal of hardware  Antibiotic plan as above.  Bilateral lower extremity edema Presented with lower extremity edema, BNP mildly elevated. Most recent June 2025 showed EF 45 to 50%, no RWMA, grade 1 DD Follow-up repeat echo this admission showed an improved EF to 60 to 65%, no WMA, mild LVH Ultrasound duplex negative for DVT. Initially suspected to have CHF exacerbation and was diuresed with IV Lasix  which was later stopped because of elevated creatinine.   PTA meds- Coreg  6.25 mg twice daily, torsemide  20 mg daily Currently on Coreg  6.25 mg twice daily and torsemide  20 mg daily Hemodynamically stable.  Creatinine slightly up.  Continue to monitor.   Ace wraps around bilateral lower extremity swelling  Hypomagnesemia Magnesium  level improved with replacement Recent Labs  Lab 07/03/24 2302 07/05/24 0233 07/06/24 0644 07/07/24 0735 07/08/24 0216  K 3.8 3.9 3.8 4.4 4.2  MG  --  1.5* 1.5* 2.0 1.7   H/o CAD Not on any antiplatelet or statin??  Probably because of liver cirrhosis  NASH cirrhosis Has chronic bilateral lower  extremity edema probably worsened due to bedbound status.  Ultrasound duplex negative for DVT No evidence of ascites currently. Diuretics plan as above Recent Labs  Lab 07/03/24 2302 07/05/24 0233 07/06/24 0644 07/07/24 0735 07/08/24 0216  AST 47*  --   --   --   --   ALT 32  --   --   --   --   ALKPHOS 269*  --   --   --   --   BILITOT 0.9  --   --   --   --   PROT 6.3*  --   --   --   --   ALBUMIN  2.6*  --   --   --   --   PLT 174 167 139* 119* 151   Thrombocytopenia Mild, in the setting of liver cirrhosis. Platelet count was initially declining, improved after heparin  product was stopped. Currently on SCD for DVT prophylaxis  Type 2 diabetes mellitus A1c 6.4 on 07/03/2024 PTA meds-Tresiba  40 units daily, Mounjaro weekly, Premeal NovoLog  3 times daily Currently on Lantus   units daily and SSI/Accu-Cheks only.  Blood sugar level gradually improving but is still elevated.  I have increased Lantus  to 35 units for this morning.   Recent Labs  Lab 07/07/24 1145 07/07/24 1631 07/07/24 2130 07/08/24 0626 07/08/24 1125  GLUCAP 176* 175* 175* 182* 178*   CKD 3B Creatinine slightly elevated with diuresis.  Continue to monitor  Recent Labs    01/25/24 0400 01/28/24 0512 03/17/24 1405 04/10/24 0903 05/21/24 1213 07/03/24 2302 07/05/24 0233 07/06/24 0644 07/07/24 0735 07/08/24 0216  BUN 29* 25* 34* 24 24 23  25* 30* 25* 29*  CREATININE 1.45* 1.37* 1.18* 1.08* 1.06* 1.06* 1.37* 1.27* 1.07* 1.20*  CO2 28 28 32 33* 27 28 29 29 25 27    Hyponatremia Mild.  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  Not sure if it has any role on her current symptoms Follows up with pulmonologist Dr. Shellia as an outpatient  Mild chronic anemia Esophagitis Baseline hemoglobin above 10.  No active bleeding. Continue to monitor.  Plan to transfuse if less than  7. Continue PPI.  Follows up with GI as an outpatient Recent Labs    07/03/24 2302 07/05/24 0233 07/06/24 0644 07/07/24 0735 07/08/24 0216  HGB 10.1* 9.4* 8.7* 9.6* 9.8*  MCV 92.2 91.5 89.9 89.7 89.4   Obesity 2 Body mass index is 37.9 kg/m. Patient has been advised to make an attempt to improve diet and exercise patterns to aid in weight loss.  Anxiety and depression Continue Cymbalta  and mirtazapine     Restless leg syndrome Continue Mirapex , Lyrica  75 mg 3 times daily  OSA (obstructive sleep apnea) continue CPAP    Impaired mobility and ADLs Bedbound for last few months since vertebral surgery. Has some strength on the both distal muscles but unable to support her weight.  At home, she was using upper body strength to transfer still out of bed but with the right shoulder condition, unable to do that.   PT/OT eval obtained.  SNF recommended.  Pending authorization  PT Orders:   PT Follow up Rec: Home Health Pt9/30/2025 1050   Goals of care   Code Status: Limited: Do not attempt resuscitation (DNR) -DNR-LIMITED -Do Not Intubate/DNI  DVT prophylaxis:  Place and maintain sequential compression device Start: 07/07/24 0847   Antimicrobials: Currently on oral ciprofloxacin  and doxycycline  Fluid: None Consultants: Orthopedics, ID Family Communication: Husband at bedside today  Status: Inpatient Level of care:  Progressive   Patient is from: Home Needs to continue in-hospital care: Pending insurance authorization for SNF.  Medically stable for discharge   Diet:  Diet Order             Diet Carb Modified Fluid consistency: Thin; Room service appropriate? Yes  Diet effective now                   Scheduled Meds:  carvedilol   6.25 mg Oral BID WC   ciprofloxacin   500 mg Oral BID   doxycycline   100 mg Oral BID WC   DULoxetine   60 mg Oral BID   insulin  aspart  0-6 Units Subcutaneous TID AC & HS   insulin  glargine  35 Units Subcutaneous Daily    mirtazapine   30 mg Oral QHS   multivitamin with minerals  1 tablet Oral Daily   pantoprazole   40 mg Oral Daily   polyethylene glycol  17 g Oral Daily   pramipexole   1 mg Oral QHS   pregabalin   75 mg Oral TID   senna-docusate  2 tablet Oral BID   sodium chloride  flush  3 mL Intravenous Q12H   torsemide   20 mg Oral BID    PRN meds: acetaminophen  **OR** acetaminophen , diclofenac  Sodium, melatonin, methocarbamol , mouth rinse, oxyCODONE    Infusions:     Antimicrobials: Anti-infectives (From admission, onward)    Start     Dose/Rate Route Frequency Ordered Stop   07/07/24 1630  doxycycline  (VIBRA -TABS) tablet 100 mg        100 mg Oral 2 times daily with meals 07/07/24 1234     07/07/24 1330  ciprofloxacin  (CIPRO ) 500 MG/5ML (10%) suspension 500 mg        500 mg Oral 2 times daily 07/07/24 1234     07/05/24 1700  ceFEPIme  (MAXIPIME ) 2 g in sodium chloride  0.9 % 100 mL IVPB  Status:  Discontinued        2 g 200 mL/hr over 30 Minutes Intravenous Every 12 hours 07/05/24 1557 07/07/24 1234   07/05/24 1700  DAPTOmycin  (CUBICIN ) IVPB 500 mg/50mL premix  Status:  Discontinued        8 mg/kg  67.7 kg (Adjusted) 100 mL/hr over 30 Minutes Intravenous Daily 07/05/24 1558 07/07/24 1234   07/04/24 1045  ciprofloxacin  (CIPRO ) tablet 500 mg  Status:  Discontinued        500 mg Oral 2 times daily 07/04/24 1036 07/05/24 1541   07/04/24 1045  doxycycline  (VIBRA -TABS) tablet 100 mg  Status:  Discontinued        100 mg Oral 2 times daily 07/04/24 1036 07/05/24 1541       Objective: Vitals:   07/08/24 0800 07/08/24 1128  BP: 134/64 (!) 105/57  Pulse: 72 81  Resp:  20  Temp:  98.2 F (36.8 C)  SpO2:  98%    Intake/Output Summary (Last 24 hours) at 07/08/2024 1354 Last data filed at 07/08/2024 1234 Gross per 24 hour  Intake 1137 ml  Output 3875 ml  Net -2738 ml   Filed Weights   07/04/24 0721 07/05/24 0628  Weight: 90.7 kg 94 kg   Weight change:  Body mass index is 37.9 kg/m.    Physical Exam: General exam: Pleasant, elderly Caucasian female. Skin: No rashes,  lesions or ulcers. HEENT: Atraumatic, normocephalic, no obvious bleeding Lungs: Clear to auscultation bilaterally,  CVS: S1, S2, no murmur,   GI/Abd: Soft, nontender, nondistended, bowel sound present,   CNS: Alert, awake, oriented x 3.  2/5 strength on wiggling both feet toes but unable to lift up legs Psychiatry: Mood appropriate Extremities: Persistent bilateral pitting 1+ pedal edema, no calf tenderness,   Data Review: I have personally reviewed the laboratory data and studies available.  F/u labs ordered Unresulted Labs (From admission, onward)     Start     Ordered   07/05/24 0500  Basic metabolic panel with GFR  Daily,   R     Question:  Specimen collection method  Answer:  Lab=Lab collect   07/04/24 1703   07/05/24 0500  CBC with Differential/Platelet  Daily,   R     Question:  Specimen collection method  Answer:  Lab=Lab collect   07/04/24 1703   07/05/24 0500  Magnesium   Daily,   R     Question:  Specimen collection method  Answer:  Lab=Lab collect   07/04/24 1703            Signed, Chapman Rota, MD [facility] 07/08/2024

## 2024-07-08 NOTE — Progress Notes (Signed)
 Physical Therapy Treatment Patient Details Name: Katelyn [name] MRN: [id number] DOB: Jul 28, 1956 Today's Date: 07/08/2024   History of Present Illness 68 y.o. F adm 07/02/24 with LB edema. Pt with rt shoulder mass concerning for abscess/granulomatous process and possible hematoma, and interval bone destruction and absence of humeral head and neck. 9/26 IR aspirated 25 cc. Xray shows anterior dislocation of the right humerus with associated erosive changes  of the humeral head and glenoid. PMHx: AVN and advanced glenohumeral arthritis (awaiting sx), multiple lumbar surgeries, anxiety, chronic low back pain, DM2, obesity, OSA, LBBB, HTN, HLD, CKD, CAD.    PT Comments  Pt very pleasant with cues and assist to don sling correctly. Pt able to pivot to EOB, transfer to [facility] and perform WC mobility in hall with min assist. Pt states she feels she can manage at home with spouse at [facility] level and would prefer to hold SNF stay until after shoulder replacement. Pt educated for transfers, safety and sequence with legs ace wrapped beginning of session. Pt with mobility conducive to return home as long as family can assist with transfers, aDLs and IADLS. HHPT recommended if all previous can be met. Will continue to follow acutely with pt encouraged to mobilize at [facility] level with staff.     If plan is discharge home, recommend the following: A lot of help with bathing/dressing/bathroom;Assistance with cooking/housework;Assist for transportation;Help with stairs or ramp for entrance;A little help with walking and/or transfers   Can travel by private vehicle     Yes (can perform pivot to car, but can't take steps)  Equipment Recommendations  None recommended by PT    Recommendations for Other Services       Precautions / Restrictions Precautions Precautions: Fall Recall of Precautions/Restrictions: Impaired Precaution/Restrictions Comments: Instructed in RUE NWB and sling use as pt reaching arm out of  sling Restrictions RUE Weight Bearing Per Provider Order: Non weight bearing     Mobility  Bed Mobility Overal bed mobility: Needs Assistance Bed Mobility: Rolling, Sidelying to Sit Rolling: Contact guard assist Sidelying to sit: Contact guard assist       General bed mobility comments: bed flat without rail, tactile cues to sequence roll to left and rise with LUE with limited tactile cues to fully rise, increased time    Transfers Overall transfer level: Needs assistance   Transfers: Sit to/from Stand, Bed to chair/wheelchair/BSC Sit to Stand: Min assist Stand pivot transfers: Contact guard assist         General transfer comment: min assist to rise from bed x1 and WC x 2 with cues for hand placement and safety. Pt with stand pivot with good stability bed to [facility] toward left. pt stood from [facility] and performed single step forward and back with LUE hand held assist, did not feel comfortable to take more steps from surface    Ambulation/Gait                   Psychologist, counselling mobility: Yes Wheelchair propulsion: Left upper extremity, Both lower extermities Wheelchair parts: Supervision/cueing Distance: 200 Wheelchair Assistance Details (indicate cue type and reason): cues for direction, min assist at times to turn   Tilt Bed    Modified Rankin (Stroke Patients Only)       Balance Overall balance assessment: Needs assistance Sitting-balance support: Feet supported Sitting balance-Leahy Scale: Good     Standing balance support: Single  extremity supported, During functional activity Standing balance-Leahy Scale: Poor Standing balance comment: static standing with support                            Communication Communication Communication: No apparent difficulties  Cognition Arousal: Alert Behavior During Therapy: WFL for tasks assessed/performed   PT - Cognitive impairments:  Safety/Judgement                       PT - Cognition Comments: needs cues to maintain sling and weight off of RUE Following commands: Intact Following commands impaired: Only follows one step commands consistently    Cueing Cueing Techniques: Verbal cues  Exercises      General Comments        Pertinent Vitals/Pain Pain Assessment Pain Assessment: No/denies pain    Home Living                          Prior Function            PT Goals (current goals can now be found in the care plan section) Progress towards PT goals: Progressing toward goals    Frequency    Min 2X/week      PT Plan      Co-evaluation              AM-PAC PT 6 Clicks Mobility   Outcome Measure  Help needed turning from your back to your side while in a flat bed without using bedrails?: A Little Help needed moving from lying on your back to sitting on the side of a flat bed without using bedrails?: A Little Help needed moving to and from a bed to a chair (including a wheelchair)?: A Little Help needed standing up from a chair using your arms (e.g., wheelchair or bedside chair)?: A Little Help needed to walk in hospital room?: Total Help needed climbing 3-5 steps with a railing? : Total 6 Click Score: 14    End of Session Equipment Utilized During Treatment: Gait belt Activity Tolerance: Patient tolerated treatment well Patient left: in chair;with call bell/phone within reach Nurse Communication: Mobility status PT Visit Diagnosis: Other abnormalities of gait and mobility (R26.89);Difficulty in walking, not elsewhere classified (R26.2);Unsteadiness on feet (R26.81);Muscle weakness (generalized) (M62.81)     Time: 9094-9069 PT Time Calculation (min) (ACUTE ONLY): 25 min  Charges:    $Therapeutic Activity: 8-22 mins $Wheel Chair Management: 8-22 mins PT General Charges $$ ACUTE PT VISIT: 1 Visit                     Lenoard SQUIBB, PT Acute Rehabilitation  Services Office: [phone]    Lenoard NOVAK Lasondra Hodgkins 07/08/2024, 10:52 AM

## 2024-07-08 NOTE — TOC Progression Note (Signed)
 Transition of Care [facility]) - Progression Note    Patient Details  Name: Katelyn [name] MRN: [id number] Date of Birth: 1955/12/14  [facility] CM/SW Contact  Waddell Barnie Rama, RN Phone Number: 07/08/2024, 2:54 PM  Clinical Narrative:    NCM spoke with patient, offered choice, she states [name] is ok ,  NCM made referral to [facility] with [facility], he is able to take referral for HHPT, HHOT.  Soc will begin 24 to 48 hrs post dc.    Expected Discharge Plan:  (SNF vs Home w/ HH) Barriers to Discharge: Continued Medical Work up               Expected Discharge Plan and Services In-house Referral: Clinical Social Work Discharge Planning Services: CM Consult   Living arrangements for the past 2 months: Single Family Home                                       Social Drivers of Health (SDOH) Interventions SDOH Screenings   Food Insecurity: No Food Insecurity (07/04/2024)  Housing: Low Risk  (07/04/2024)  Transportation Needs: No Transportation Needs (07/04/2024)  Utilities: Not At Risk (07/04/2024)  Depression (PHQ2-9): Low Risk  (05/07/2024)  Social Connections: Socially Integrated (07/04/2024)  Tobacco Use: Low Risk  (07/04/2024)    Readmission Risk Interventions     No data to display

## 2024-07-10 LAB — CULTURE, BLOOD (ROUTINE X 2)
Culture: NO GROWTH
Culture: NO GROWTH

## 2024-07-23 ENCOUNTER — Other Ambulatory Visit: Payer: Self-pay

## 2024-07-23 ENCOUNTER — Telehealth: Payer: Self-pay | Admitting: Cardiology

## 2024-07-23 MED ORDER — NITROGLYCERIN 0.4 MG SL SUBL: 0.4000 mg | SUBLINGUAL_TABLET | SUBLINGUAL | 1 refills | Status: AC | PRN

## 2024-07-23 NOTE — Telephone Encounter (Signed)
 Called the patient and left a detailed message explaining that her Nitroglycerin  medication had been ordered and I also explained how and when she should take the medication. I also explained that if she has any questions to contact the office and the office phone number was provided.

## 2024-07-23 NOTE — Telephone Encounter (Signed)
 Pt is requesting a refill on medication nitroglycerin . This medication was D/C in 01/2024 when pt was in the hospital and was not added back on pt's medication list, but delon Hoover, NP stated for pt to continue nitroglycerin , but medication was not prescribed to pt again. Please address

## 2024-07-23 NOTE — Telephone Encounter (Signed)
*  STAT* If patient is at the pharmacy, call can be transferred to refill team.   1. Which medications need to be refilled? (please list name of each medication and dose if known) nitroGLYCERIN  (NITROSTAT ) 0.4 MG SL tablet   2. Which pharmacy/location (including street and city if local pharmacy) is medication to be sent to?  [id number] - [place], [place] - [address]      3. Do they need a 30 day or 90 day supply? 90 day

## 2024-08-03 ENCOUNTER — Inpatient Hospital Stay (HOSPITAL_COMMUNITY)
Admission: EM | Admit: 2024-08-03 | Discharge: 2024-08-11 | DRG: 291 | Disposition: A | Discharge status: Home / Self Care | Attending: Internal Medicine | Admitting: Internal Medicine

## 2024-08-03 ENCOUNTER — Emergency Department (HOSPITAL_COMMUNITY)

## 2024-08-03 ENCOUNTER — Encounter (HOSPITAL_COMMUNITY): Payer: Self-pay

## 2024-08-03 DIAGNOSIS — N183 Chronic kidney disease, stage 3 unspecified: Secondary | ICD-10-CM

## 2024-08-03 DIAGNOSIS — Z981 Arthrodesis status: Secondary | ICD-10-CM

## 2024-08-03 DIAGNOSIS — I739 Peripheral vascular disease, unspecified: Secondary | ICD-10-CM | POA: Diagnosis present

## 2024-08-03 DIAGNOSIS — I10 Essential (primary) hypertension: Secondary | ICD-10-CM | POA: Diagnosis not present

## 2024-08-03 DIAGNOSIS — I130 Hypertensive heart and chronic kidney disease with heart failure and stage 1 through stage 4 chronic kidney disease, or unspecified chronic kidney disease: Principal | ICD-10-CM | POA: Diagnosis present

## 2024-08-03 DIAGNOSIS — Z1152 Encounter for screening for COVID-19: Secondary | ICD-10-CM

## 2024-08-03 DIAGNOSIS — D638 Anemia in other chronic diseases classified elsewhere: Secondary | ICD-10-CM | POA: Diagnosis present

## 2024-08-03 DIAGNOSIS — E1165 Type 2 diabetes mellitus with hyperglycemia: Secondary | ICD-10-CM | POA: Diagnosis present

## 2024-08-03 DIAGNOSIS — M869 Osteomyelitis, unspecified: Secondary | ICD-10-CM | POA: Diagnosis present

## 2024-08-03 DIAGNOSIS — M4714 Other spondylosis with myelopathy, thoracic region: Secondary | ICD-10-CM | POA: Diagnosis present

## 2024-08-03 DIAGNOSIS — E119 Type 2 diabetes mellitus without complications: Principal | ICD-10-CM

## 2024-08-03 DIAGNOSIS — Z8249 Family history of ischemic heart disease and other diseases of the circulatory system: Secondary | ICD-10-CM

## 2024-08-03 DIAGNOSIS — Z792 Long term (current) use of antibiotics: Secondary | ICD-10-CM

## 2024-08-03 DIAGNOSIS — Z794 Long term (current) use of insulin: Secondary | ICD-10-CM

## 2024-08-03 DIAGNOSIS — Z801 Family history of malignant neoplasm of trachea, bronchus and lung: Secondary | ICD-10-CM

## 2024-08-03 DIAGNOSIS — R627 Adult failure to thrive: Secondary | ICD-10-CM | POA: Diagnosis present

## 2024-08-03 DIAGNOSIS — E669 Obesity, unspecified: Secondary | ICD-10-CM | POA: Diagnosis present

## 2024-08-03 DIAGNOSIS — F3340 Major depressive disorder, recurrent, in remission, unspecified: Secondary | ICD-10-CM | POA: Diagnosis present

## 2024-08-03 DIAGNOSIS — E785 Hyperlipidemia, unspecified: Secondary | ICD-10-CM | POA: Diagnosis not present

## 2024-08-03 DIAGNOSIS — G894 Chronic pain syndrome: Secondary | ICD-10-CM | POA: Diagnosis not present

## 2024-08-03 DIAGNOSIS — Z885 Allergy status to narcotic agent status: Secondary | ICD-10-CM

## 2024-08-03 DIAGNOSIS — G43009 Migraine without aura, not intractable, without status migrainosus: Secondary | ICD-10-CM | POA: Diagnosis present

## 2024-08-03 DIAGNOSIS — K7460 Unspecified cirrhosis of liver: Secondary | ICD-10-CM | POA: Diagnosis present

## 2024-08-03 DIAGNOSIS — R0600 Dyspnea, unspecified: Principal | ICD-10-CM

## 2024-08-03 DIAGNOSIS — Z79899 Other long term (current) drug therapy: Secondary | ICD-10-CM

## 2024-08-03 DIAGNOSIS — E1122 Type 2 diabetes mellitus with diabetic chronic kidney disease: Secondary | ICD-10-CM | POA: Diagnosis present

## 2024-08-03 DIAGNOSIS — I5031 Acute diastolic (congestive) heart failure: Secondary | ICD-10-CM | POA: Diagnosis present

## 2024-08-03 DIAGNOSIS — Z825 Family history of asthma and other chronic lower respiratory diseases: Secondary | ICD-10-CM

## 2024-08-03 DIAGNOSIS — I5033 Acute on chronic diastolic (congestive) heart failure: Secondary | ICD-10-CM | POA: Diagnosis present

## 2024-08-03 DIAGNOSIS — Z66 Do not resuscitate: Secondary | ICD-10-CM | POA: Diagnosis present

## 2024-08-03 DIAGNOSIS — G4733 Obstructive sleep apnea (adult) (pediatric): Secondary | ICD-10-CM | POA: Diagnosis present

## 2024-08-03 DIAGNOSIS — G2581 Restless legs syndrome: Secondary | ICD-10-CM | POA: Diagnosis present

## 2024-08-03 DIAGNOSIS — I472 Ventricular tachycardia, unspecified: Secondary | ICD-10-CM | POA: Diagnosis present

## 2024-08-03 DIAGNOSIS — Z860100 Personal history of colon polyps, unspecified: Secondary | ICD-10-CM

## 2024-08-03 DIAGNOSIS — Z888 Allergy status to other drugs, medicaments and biological substances status: Secondary | ICD-10-CM

## 2024-08-03 DIAGNOSIS — Z7189 Other specified counseling: Secondary | ICD-10-CM | POA: Diagnosis not present

## 2024-08-03 DIAGNOSIS — Z9049 Acquired absence of other specified parts of digestive tract: Secondary | ICD-10-CM

## 2024-08-03 DIAGNOSIS — L02413 Cutaneous abscess of right upper limb: Secondary | ICD-10-CM | POA: Diagnosis present

## 2024-08-03 DIAGNOSIS — Z515 Encounter for palliative care: Secondary | ICD-10-CM | POA: Diagnosis not present

## 2024-08-03 DIAGNOSIS — Z6841 Body Mass Index (BMI) 40.0 and over, adult: Secondary | ICD-10-CM

## 2024-08-03 DIAGNOSIS — J9621 Acute and chronic respiratory failure with hypoxia: Secondary | ICD-10-CM | POA: Diagnosis not present

## 2024-08-03 DIAGNOSIS — K7581 Nonalcoholic steatohepatitis (NASH): Secondary | ICD-10-CM | POA: Diagnosis present

## 2024-08-03 DIAGNOSIS — Z9689 Presence of other specified functional implants: Secondary | ICD-10-CM

## 2024-08-03 DIAGNOSIS — E1140 Type 2 diabetes mellitus with diabetic neuropathy, unspecified: Secondary | ICD-10-CM | POA: Diagnosis present

## 2024-08-03 DIAGNOSIS — N1832 Chronic kidney disease, stage 3b: Secondary | ICD-10-CM | POA: Diagnosis present

## 2024-08-03 DIAGNOSIS — D500 Iron deficiency anemia secondary to blood loss (chronic): Secondary | ICD-10-CM | POA: Diagnosis present

## 2024-08-03 DIAGNOSIS — I5189 Other ill-defined heart diseases: Secondary | ICD-10-CM

## 2024-08-03 DIAGNOSIS — R609 Edema, unspecified: Secondary | ICD-10-CM | POA: Diagnosis not present

## 2024-08-03 DIAGNOSIS — K219 Gastro-esophageal reflux disease without esophagitis: Secondary | ICD-10-CM | POA: Diagnosis not present

## 2024-08-03 DIAGNOSIS — E1151 Type 2 diabetes mellitus with diabetic peripheral angiopathy without gangrene: Secondary | ICD-10-CM | POA: Diagnosis present

## 2024-08-03 DIAGNOSIS — J9611 Chronic respiratory failure with hypoxia: Secondary | ICD-10-CM | POA: Diagnosis present

## 2024-08-03 DIAGNOSIS — J984 Other disorders of lung: Secondary | ICD-10-CM | POA: Diagnosis present

## 2024-08-03 DIAGNOSIS — Z7985 Long-term (current) use of injectable non-insulin antidiabetic drugs: Secondary | ICD-10-CM

## 2024-08-03 DIAGNOSIS — Z87442 Personal history of urinary calculi: Secondary | ICD-10-CM

## 2024-08-03 DIAGNOSIS — Z90710 Acquired absence of both cervix and uterus: Secondary | ICD-10-CM

## 2024-08-03 DIAGNOSIS — I2510 Atherosclerotic heart disease of native coronary artery without angina pectoris: Secondary | ICD-10-CM | POA: Diagnosis not present

## 2024-08-03 DIAGNOSIS — G629 Polyneuropathy, unspecified: Secondary | ICD-10-CM

## 2024-08-03 DIAGNOSIS — F411 Generalized anxiety disorder: Secondary | ICD-10-CM | POA: Diagnosis present

## 2024-08-03 DIAGNOSIS — I5032 Chronic diastolic (congestive) heart failure: Secondary | ICD-10-CM | POA: Diagnosis present

## 2024-08-03 DIAGNOSIS — Z7401 Bed confinement status: Secondary | ICD-10-CM

## 2024-08-03 DIAGNOSIS — Z823 Family history of stroke: Secondary | ICD-10-CM

## 2024-08-03 HISTORY — DX: Morbid (severe) obesity due to excess calories: E66.01

## 2024-08-03 LAB — CBC WITH DIFFERENTIAL/PLATELET
Abs Immature Granulocytes: 0.03 K/uL (ref 0.00–0.07)
Basophils Absolute: 0 K/uL (ref 0.0–0.1)
Basophils Relative: 1 %
Eosinophils Absolute: 0.1 K/uL (ref 0.0–0.5)
Eosinophils Relative: 2 %
HCT: 31.2 % — ABNORMAL LOW (ref 36.0–46.0)
Hemoglobin: 9.9 g/dL — ABNORMAL LOW (ref 12.0–15.0)
Immature Granulocytes: 1 %
Lymphocytes Relative: 10 %
Lymphs Abs: 0.6 K/uL — ABNORMAL LOW (ref 0.7–4.0)
MCH: 29.2 pg (ref 26.0–34.0)
MCHC: 31.7 g/dL (ref 30.0–36.0)
MCV: 92 fL (ref 80.0–100.0)
Monocytes Absolute: 0.5 K/uL (ref 0.1–1.0)
Monocytes Relative: 7 %
Neutro Abs: 4.9 K/uL (ref 1.7–7.7)
Neutrophils Relative %: 79 %
Platelets: 144 K/uL — ABNORMAL LOW (ref 150–400)
RBC: 3.39 MIL/uL — ABNORMAL LOW (ref 3.87–5.11)
RDW: 16.1 % — ABNORMAL HIGH (ref 11.5–15.5)
WBC: 6.2 K/uL (ref 4.0–10.5)
nRBC: 0 % (ref 0.0–0.2)

## 2024-08-03 LAB — I-STAT CHEM 8, ED
BUN: 25 mg/dL — ABNORMAL HIGH (ref 8–23)
Calcium, Ion: 1.16 mmol/L (ref 1.15–1.40)
Chloride: 106 mmol/L (ref 98–111)
Creatinine, Ser: 1 mg/dL (ref 0.44–1.00)
Glucose, Bld: 167 mg/dL — ABNORMAL HIGH (ref 70–99)
HCT: 29 % — ABNORMAL LOW (ref 36.0–46.0)
Hemoglobin: 9.9 g/dL — ABNORMAL LOW (ref 12.0–15.0)
Potassium: 4.3 mmol/L (ref 3.5–5.1)
Sodium: 141 mmol/L (ref 135–145)
TCO2: 27 mmol/L (ref 22–32)

## 2024-08-03 LAB — TROPONIN I (HIGH SENSITIVITY)
Troponin I (High Sensitivity): 30 ng/L — ABNORMAL HIGH (ref <18)
Troponin I (High Sensitivity): 36 ng/L — ABNORMAL HIGH (ref <18)

## 2024-08-03 LAB — COMPREHENSIVE METABOLIC PANEL WITH GFR
ALT: 32 U/L (ref 0–44)
AST: 55 U/L — ABNORMAL HIGH (ref 15–41)
Albumin: 2.8 g/dL — ABNORMAL LOW (ref 3.5–5.0)
Alkaline Phosphatase: 206 U/L — ABNORMAL HIGH (ref 38–126)
Anion gap: 10 (ref 5–15)
BUN: 21 mg/dL (ref 8–23)
CO2: 23 mmol/L (ref 22–32)
Calcium: 8.8 mg/dL — ABNORMAL LOW (ref 8.9–10.3)
Chloride: 105 mmol/L (ref 98–111)
Creatinine, Ser: 0.93 mg/dL (ref 0.44–1.00)
GFR, Estimated: >60 mL/min (ref >60)
Glucose, Bld: 166 mg/dL — ABNORMAL HIGH (ref 70–99)
Potassium: 4.3 mmol/L (ref 3.5–5.1)
Sodium: 138 mmol/L (ref 135–145)
Total Bilirubin: 0.7 mg/dL (ref 0.0–1.2)
Total Protein: 6.6 g/dL (ref 6.5–8.1)

## 2024-08-03 LAB — RESP PANEL BY RT-PCR (RSV, FLU A&B, COVID)  RVPGX2
Influenza A by PCR: NEGATIVE
Influenza B by PCR: NEGATIVE
Resp Syncytial Virus by PCR: NEGATIVE
SARS Coronavirus 2 by RT PCR: NEGATIVE

## 2024-08-03 LAB — MAGNESIUM: Magnesium: 1.5 mg/dL — ABNORMAL LOW (ref 1.7–2.4)

## 2024-08-03 LAB — I-STAT VENOUS BLOOD GAS, ED
Acid-Base Excess: 1 mmol/L (ref 0.0–2.0)
Bicarbonate: 27.2 mmol/L (ref 20.0–28.0)
Calcium, Ion: 1.18 mmol/L (ref 1.15–1.40)
HCT: 30 % — ABNORMAL LOW (ref 36.0–46.0)
Hemoglobin: 10.2 g/dL — ABNORMAL LOW (ref 12.0–15.0)
O2 Saturation: 98 %
Potassium: 4.3 mmol/L (ref 3.5–5.1)
Sodium: 141 mmol/L (ref 135–145)
TCO2: 29 mmol/L (ref 22–32)
pCO2, Ven: 50.7 mmHg (ref 44–60)
pH, Ven: 7.338 (ref 7.25–7.43)
pO2, Ven: 105 mmHg — ABNORMAL HIGH (ref 32–45)

## 2024-08-03 LAB — CBG MONITORING, ED
Glucose-Capillary: 182 mg/dL — ABNORMAL HIGH (ref 70–99)
Glucose-Capillary: 306 mg/dL — ABNORMAL HIGH (ref 70–99)

## 2024-08-03 LAB — I-STAT CG4 LACTIC ACID, ED: Lactic Acid, Venous: 1.1 mmol/L (ref 0.5–1.9)

## 2024-08-03 LAB — BRAIN NATRIURETIC PEPTIDE: B Natriuretic Peptide: 193.9 pg/mL — ABNORMAL HIGH (ref 0.0–100.0)

## 2024-08-03 MED ORDER — OXYCODONE HCL 5 MG PO TABS
5.0000 mg | ORAL_TABLET | Freq: Four times a day (QID) | ORAL | Status: DC | PRN
  Administered 2024-08-05 – 2024-08-06 (×3): 5 mg via ORAL
  Filled 2024-08-03 (×3): qty 1

## 2024-08-03 MED ORDER — DOXYCYCLINE HYCLATE 100 MG PO TABS
100.0000 mg | ORAL_TABLET | Freq: Two times a day (BID) | ORAL | Status: DC
  Administered 2024-08-03 – 2024-08-11 (×16): 100 mg via ORAL
  Filled 2024-08-03 (×16): qty 1

## 2024-08-03 MED ORDER — TORSEMIDE 20 MG PO TABS: 20.0000 mg | ORAL_TABLET | Freq: Two times a day (BID) | ORAL | Status: DC

## 2024-08-03 MED ORDER — INSULIN GLARGINE-YFGN 100 UNIT/ML ~~LOC~~ SOLN
30.0000 [IU] | Freq: Every day | SUBCUTANEOUS | Status: DC
  Administered 2024-08-03 – 2024-08-10 (×8): 30 [IU] via SUBCUTANEOUS
  Filled 2024-08-03 (×10): qty 0.3

## 2024-08-03 MED ORDER — MELATONIN 5 MG PO TABS
5.0000 mg | ORAL_TABLET | Freq: Every evening | ORAL | Status: DC | PRN
  Administered 2024-08-04 – 2024-08-06 (×2): 5 mg via ORAL
  Filled 2024-08-03 (×2): qty 1

## 2024-08-03 MED ORDER — IPRATROPIUM-ALBUTEROL 0.5-2.5 (3) MG/3ML IN SOLN
3.0000 mL | Freq: Once | RESPIRATORY_TRACT | Status: AC
  Administered 2024-08-03: 3 mL via RESPIRATORY_TRACT
  Filled 2024-08-03: qty 3

## 2024-08-03 MED ORDER — PANTOPRAZOLE SODIUM 40 MG PO TBEC
40.0000 mg | DELAYED_RELEASE_TABLET | Freq: Every day | ORAL | Status: DC
  Administered 2024-08-04 – 2024-08-11 (×8): 40 mg via ORAL
  Filled 2024-08-03 (×8): qty 1

## 2024-08-03 MED ORDER — IPRATROPIUM-ALBUTEROL 0.5-2.5 (3) MG/3ML IN SOLN: 3.0000 mL | Freq: Four times a day (QID) | RESPIRATORY_TRACT | Status: DC | PRN

## 2024-08-03 MED ORDER — CIPROFLOXACIN HCL 500 MG PO TABS
500.0000 mg | ORAL_TABLET | Freq: Two times a day (BID) | ORAL | Status: DC
  Administered 2024-08-03 – 2024-08-11 (×16): 500 mg via ORAL
  Filled 2024-08-03 (×19): qty 1

## 2024-08-03 MED ORDER — POLYETHYLENE GLYCOL 3350 17 G PO PACK: 17.0000 g | PACK | Freq: Every day | ORAL | Status: DC | PRN

## 2024-08-03 MED ORDER — CARVEDILOL 6.25 MG PO TABS
6.2500 mg | ORAL_TABLET | Freq: Two times a day (BID) | ORAL | Status: DC
  Administered 2024-08-04 – 2024-08-11 (×15): 6.25 mg via ORAL
  Filled 2024-08-03 (×11): qty 1
  Filled 2024-08-03: qty 2
  Filled 2024-08-03 (×3): qty 1

## 2024-08-03 MED ORDER — FUROSEMIDE 10 MG/ML IJ SOLN
40.0000 mg | Freq: Once | INTRAMUSCULAR | Status: AC
  Administered 2024-08-03: 40 mg via INTRAVENOUS
  Filled 2024-08-03: qty 4

## 2024-08-03 MED ORDER — ENOXAPARIN SODIUM 40 MG/0.4ML IJ SOSY
40.0000 mg | PREFILLED_SYRINGE | INTRAMUSCULAR | Status: DC
  Administered 2024-08-03 – 2024-08-10 (×8): 40 mg via SUBCUTANEOUS
  Filled 2024-08-03 (×8): qty 0.4

## 2024-08-03 MED ORDER — INSULIN ASPART 100 UNIT/ML IJ SOLN
0.0000 [IU] | Freq: Three times a day (TID) | INTRAMUSCULAR | Status: DC
  Administered 2024-08-03: 3 [IU] via SUBCUTANEOUS
  Administered 2024-08-04: 5 [IU] via SUBCUTANEOUS
  Administered 2024-08-04: 3 [IU] via SUBCUTANEOUS
  Administered 2024-08-04 – 2024-08-06 (×5): 2 [IU] via SUBCUTANEOUS
  Administered 2024-08-07 (×2): 3 [IU] via SUBCUTANEOUS
  Administered 2024-08-08: 2 [IU] via SUBCUTANEOUS
  Administered 2024-08-08: 5 [IU] via SUBCUTANEOUS
  Administered 2024-08-08: 3 [IU] via SUBCUTANEOUS
  Administered 2024-08-09 (×2): 2 [IU] via SUBCUTANEOUS
  Administered 2024-08-09 – 2024-08-10 (×2): 3 [IU] via SUBCUTANEOUS
  Administered 2024-08-10: 2 [IU] via SUBCUTANEOUS
  Administered 2024-08-10: 5 [IU] via SUBCUTANEOUS
  Administered 2024-08-11: 3 [IU] via SUBCUTANEOUS

## 2024-08-03 MED ORDER — PRAMIPEXOLE DIHYDROCHLORIDE 1 MG PO TABS
1.0000 mg | ORAL_TABLET | Freq: Every day | ORAL | Status: DC
  Administered 2024-08-03 – 2024-08-10 (×8): 1 mg via ORAL
  Filled 2024-08-03 (×9): qty 1

## 2024-08-03 MED ORDER — METHYLPREDNISOLONE SODIUM SUCC 125 MG IJ SOLR
125.0000 mg | Freq: Once | INTRAMUSCULAR | Status: AC
  Administered 2024-08-03: 125 mg via INTRAVENOUS
  Filled 2024-08-03: qty 2

## 2024-08-03 MED ORDER — DULOXETINE HCL 60 MG PO CPEP
60.0000 mg | ORAL_CAPSULE | Freq: Two times a day (BID) | ORAL | Status: DC
  Administered 2024-08-03 – 2024-08-11 (×16): 60 mg via ORAL
  Filled 2024-08-03 (×7): qty 1
  Filled 2024-08-03: qty 2
  Filled 2024-08-03 (×7): qty 1
  Filled 2024-08-03: qty 2

## 2024-08-03 MED ORDER — ACETAMINOPHEN 325 MG PO TABS
650.0000 mg | ORAL_TABLET | Freq: Four times a day (QID) | ORAL | Status: DC | PRN
  Administered 2024-08-03 – 2024-08-09 (×2): 650 mg via ORAL
  Filled 2024-08-03 (×2): qty 2

## 2024-08-03 MED ORDER — SODIUM CHLORIDE 0.9% FLUSH
3.0000 mL | Freq: Two times a day (BID) | INTRAVENOUS | Status: DC
  Administered 2024-08-03 – 2024-08-11 (×17): 3 mL via INTRAVENOUS

## 2024-08-03 MED ORDER — PREGABALIN 75 MG PO CAPS
75.0000 mg | ORAL_CAPSULE | Freq: Three times a day (TID) | ORAL | Status: DC
  Administered 2024-08-03 – 2024-08-11 (×23): 75 mg via ORAL
  Filled 2024-08-03 (×14): qty 1
  Filled 2024-08-03 (×2): qty 3
  Filled 2024-08-03 (×8): qty 1

## 2024-08-03 MED ORDER — FUROSEMIDE 10 MG/ML IJ SOLN
60.0000 mg | Freq: Two times a day (BID) | INTRAMUSCULAR | Status: DC
Start: 2024-08-04 — End: 2024-08-11
  Administered 2024-08-04 – 2024-08-11 (×15): 60 mg via INTRAVENOUS
  Filled 2024-08-03 (×15): qty 6

## 2024-08-03 MED ORDER — MIRTAZAPINE 15 MG PO TABS
30.0000 mg | ORAL_TABLET | Freq: Every day | ORAL | Status: DC
  Administered 2024-08-03 – 2024-08-10 (×8): 30 mg via ORAL
  Filled 2024-08-03 (×8): qty 2

## 2024-08-03 MED ORDER — ACETAMINOPHEN 650 MG RE SUPP: 650.0000 mg | Freq: Four times a day (QID) | RECTAL | Status: DC | PRN

## 2024-08-03 NOTE — Progress Notes (Signed)
 RT note. Patient placed on bipap at this time per MD for ^ WOB. Patient on the current bipap settings sat 100%, resting comfortable. RT will continue to monitor.    08/03/24 1222  BiPAP/CPAP/SIPAP  $ Non-Invasive Ventilator  Non-Invasive Vent Initial  $ Face Mask Medium Yes  BiPAP/CPAP/SIPAP Pt Type Adult  BiPAP/CPAP/SIPAP SERVO (air)  Mask Type Full face mask  Dentures removed? Not applicable  Mask Size Medium  Set Rate 12 breaths/min  Respiratory Rate 16 breaths/min  IPAP 10 cmH20  EPAP 5 cmH2O  PEEP 5 cmH20  FiO2 (%) 40 %  Minute Ventilation 9.9  Leak 6  Peak Inspiratory Pressure (PIP) 10  Tidal Volume (Vt) 672  Patient Home Machine No  Patient Home Mask No  Patient Home Tubing No  Auto Titrate No  Press High Alarm 25 cmH2O  CPAP/SIPAP surface wiped down Yes  Device Plugged into RED Power Outlet Yes  BiPAP/CPAP /SiPAP Vitals  Pulse Rate 87  Resp (!) 21  SpO2 100 %  Bilateral Breath Sounds Expiratory wheezes;Coarse crackles  MEWS Score/Color  MEWS Score 1  MEWS Score Color Green

## 2024-08-03 NOTE — Progress Notes (Signed)
 RT note. Patient transported on 2 L [place] from Rm 34 to Rm 46 in ED without any complications.  Patient placed back on bipap at this time due to still feeling she is having a difficult time breathing, Patient  did state she does feels better. RT will continue to monitor.   08/03/24 1553  BiPAP/CPAP/SIPAP  BiPAP/CPAP/SIPAP Pt Type Adult  BiPAP/CPAP/SIPAP SERVO (air)  Mask Type Full face mask  Mask Size Medium  Set Rate 12 breaths/min  Respiratory Rate 16 breaths/min  IPAP 10 cmH20  EPAP 5 cmH2O  PEEP 5 cmH20  FiO2 (%) 40 %  Minute Ventilation 9.2  Leak 17  Peak Inspiratory Pressure (PIP) 11  Tidal Volume (Vt) 556  Press High Alarm 25 cmH2O  CPAP/SIPAP surface wiped down Yes  Device Plugged into RED Power Outlet Yes  Oxygen  Percent 40 %  BiPAP/CPAP /SiPAP Vitals  Pulse Rate 99  Resp 16  SpO2 99 %  MEWS Score/Color  MEWS Score 0  MEWS Score Color Landy

## 2024-08-03 NOTE — ED Provider Notes (Signed)
  Physical Exam  BP (!) 173/91   Pulse 87   Resp (!) 21   SpO2 100%   Physical Exam Vitals and nursing note reviewed.  Constitutional:      General: She is not in acute distress.    Appearance: She is well-developed.  HENT:     Head: Normocephalic and atraumatic.  Eyes:     Conjunctiva/sclera: Conjunctivae normal.  Cardiovascular:     Rate and Rhythm: Normal rate and regular rhythm.     Heart sounds: No murmur heard. Pulmonary:     Effort: Pulmonary effort is normal. No respiratory distress.     Breath sounds: Normal breath sounds.  Abdominal:     Palpations: Abdomen is soft.     Tenderness: There is no abdominal tenderness.  Musculoskeletal:        General: No swelling.     Cervical back: Neck supple.  Skin:    General: Skin is warm and dry.     Capillary Refill: Capillary refill takes less than 2 seconds.  Neurological:     Mental Status: She is alert.  Psychiatric:        Mood and Affect: Mood normal.     Procedures  Procedures  ED Course / MDM    Medical Decision Making Amount and/or Complexity of Data Reviewed Labs: ordered. Radiology: ordered.  Risk Prescription drug management. Decision regarding hospitalization.   Presents with shortness of breath.  CHF history.  3 days worth of worsening dyspnea.  BiPAP was started.  Need diuresis.  Awaiting callback from medicine    Admitted for further care   Katelyn Lavonia SAILOR, MD 08/03/24 1758

## 2024-08-03 NOTE — H&P (Signed)
 History and Physical   Katelyn [name] FMW:[id number] DOB: Jan 07, 1956 DOA: 08/03/2024  PCP: Ofilia Lamar CROME, MD   Patient coming from: Home  Chief Complaint: Shortness of breath  HPI: Katelyn [name] is a 68 y.o. female with medical history significant of hypertension, hyperlipidemia, CKD 3B, diabetes, neuropathy, CAD, PAD, nonsustained VT, hyperaldosteronism, chronic diastolic CHF, NASH cirrhosis, migraines, depression, anxiety, RLS, anemia, restrictive lung disease, OSA, obesity, T-spine and L-spine disease status post spinal cord stimulator and fusion, chronic pain, chronic respiratory failure with hypoxia, discitis status post hardware removal presenting with worsening shortness of breath.  Patient admitted 9/25-9/30 with right shoulder abscess.  This is in the setting of known discitis status post hardware removal.  Long-term doxycycline  and ciprofloxacin .  Patient was transition to IV antibiotics in the hospital in consultation with infectious disease and transitioned back to doxycycline  and ciprofloxacin  on discharge.  Did undergo some diuresis due to volume overload while admitted.  Patient reports 2 to 3 days of progressive shortness of breath with intermittent chest discomfort.  On EMS arrival patient's blood pressure was reportedly around 240 systolic.  Patient received DuoNeb x 2, ASA, nitro and route.  Patient on BiPAP but denies fevers, chills, abdominal pain, constipation, diarrhea, vomiting. Reports some nausea at home earlier today.  ED Course: Vital signs in the ED notable for blood pressure in the 150s-170 systolic.  On BiPAP for hypoxia/work of breathing.  Lab workup included CMP with glucose 166, calcium  8.8, albumin  2.8, AST stable at 55, alk phos stable at 206.  CBC with hemoglobin stable at 9.9, platelets stable at 144.  Lactic acid normal.  Troponin 30 with repeat pending.  BNP mildly elevated at 193.  Respiratory panel for flu COVID RSV negative.  VBG with normal pH and normal  pCO2.  Urinalysis pending.  Chest x-ray showed changes consistent with CHF including cardiomegaly, pulmonary edema, small effusions.  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  Patient received Solu-Medrol , DuoNeb, Lasix  and the above BiPAP in the ED.  Review of Systems: As per HPI otherwise all other systems reviewed and are negative.  Past Medical History:  Diagnosis Date   ACS (acute coronary syndrome) (HCC) 04/06/2023   AKI (acute kidney injury) 02/20/2023   Anxiety    Arthritis    Bursitis of right hip    CAD (coronary artery disease)    Cardiac catheterization June 2014 in [place] - 50% circumflex stenosis   Chest pain, neg MI, stable CAD non obstructive on cath 10/05/20 10/04/2020   Chronic diastolic heart failure (HCC) 08/20/2017   Chronic kidney disease, stage 3 (HCC)    does not see nephrologist   Chronic low back pain without sciatica 03/14/2016   Cirrhosis of liver (HCC)    CKD (chronic kidney disease), stage III (HCC) 10/07/2020   Complication of anesthesia    Cough 04/19/2017   Overview:  Last Assessment & Plan:  Formatting of this note may be different from the original. Cough - ? ACE related with AR triggers   Plan  Patient Instructions  Discuss with your primary doctor that lisinopril  pain, need making your cough worse. May use Mucinex  DM twice daily as needed for cough and congestion Zyrtec 10 mg at bedtime as needed for drainage Saline nasal spray as needed. Lab tests today Activity as tolerated. Follow with Dr. Shellia in 3-4 months and As needed   Please contact office for sooner follow up if symptoms do not improve or worsen or  seek emergency care    COVID-19 virus infection 06/21/2021   Formatting of this note might be different from the original.  06/21/2021: paxlovid     Depression    Dyspnea    with exertion    lazy lung - per  Dr Shellia from back issues- 06/2016   Elevated liver enzymes 12/05/2016   Essential hypertension    GERD  (gastroesophageal reflux disease)    Gout 03/14/2016   Greater trochanteric bursitis of right hip 02/02/2012   H/O hiatal hernia    Heart murmur    History of blood transfusion 2016   History of esophageal stricture 10/07/2020   History of kidney stones    Hypercholesterolemia    Hypertensive heart disease with heart failure (HCC) 01/01/2017   Hypoxia 10/07/2020   Iliotibial band syndrome of right side 02/02/2012   Iron  deficiency anemia due to chronic blood loss 03/14/2016   LBBB (left bundle branch block) 01/01/2017   Left bundle branch block    Leg weakness 10/07/2020   Lumbar stenosis    Meralgia paraesthetica 12/05/2016   Mild CAD 11/24/2015   Morbid obesity (HCC) 10/07/2020   Neuropathy    OSA (obstructive sleep apnea) 05/24/2016   Overview:  Managed PULM- no CPAP   PONV (postoperative nausea and vomiting)    no N/V with patch   Restless leg syndrome    S/P lumbar laminectomy 11/26/2015   S/P lumbar spinal fusion 08/29/2016   Serratia 02/13/2023   SIRS (systemic inflammatory response syndrome) (HCC) 04/06/2023   Tinnitus 12/05/2016   Type 2 diabetes mellitus (HCC)    UTI (urinary tract infection) 10/07/2020    Past Surgical History:  Procedure Laterality Date   ABDOMINAL HYSTERECTOMY  1983   APPENDECTOMY  Age 62   BACK SURGERY     FIRST LUMBAR FUSION/ SURGERY APRIL 2012 AND FUSION WITH INSTRUMENTATION SEPT 2012   CARDIAC CATHETERIZATION     x 2   CARPAL TUNNEL RELEASE     bil   CHOLECYSTECTOMY  1990's   COLONOSCOPY  12/12/2017   Colonic polyp status post polypectomy. Mild sigmoid diverticulosis. Otherwise normal colonoscopy to terminal ileum.   CYSTO EXTRACTION KIDNEY STONES     ESOPHAGOGASTRODUODENOSCOPY  12/12/2017   Small hiatal hernia. Mild gastritis. Status post esophageal dilatation.   EXCISION/RELEASE BURSA HIP  02/02/2012   Procedure: EXCISION/RELEASE BURSA HIP;  Surgeon: Tanda DELENA Heading, MD;  Location: [facility] ORS;  Service: Orthopedics;  Laterality:  Right;  Right Hip Bursectomy   EYE SURGERY Bilateral    cataracts   IR THORACIC DISC ASPIRATION W/IMG GUIDE  02/14/2023   IR US  GUIDE BX ASP/DRAIN  07/04/2024   KIDNEY STONE SURGERY  2008   LAMINECTOMY WITH POSTERIOR LATERAL ARTHRODESIS LEVEL 3 N/A 01/09/2024   Procedure: Posterior lateral fusion - Thoracic Seven-Thoracic Eight - Thoracic Eight-Thoracic Nine - Thoracic Nine-Thoracic Ten, Thoracic Ten-Eleven, Thoracic Eleven-Twelve, Thoracic Twelve -Lumbar One, extension of fusion and removal of Thoracic Ten screws;  Surgeon: Joshua Alm Hamilton, MD;  Location: [facility] OR;  Service: Neurosurgery;  Laterality: N/A;  Posterior lateral fusion - Thoracic Seven-Thora   LAMINECTOMY WITH POSTERIOR LATERAL ARTHRODESIS LEVEL 4 N/A 02/19/2023   Procedure: THORACIC TEN-LUMBAR TWO INSTRUMENTED FUSION;  Surgeon: Joshua Alm RAMAN, MD;  Location: [facility] OR;  Service: Neurosurgery;  Laterality: N/A;   Left knee surgery x 2  1996   reconstruction   LUMBAR DISC SURGERY  08/2016   LUMBAR LAMINECTOMY/DECOMPRESSION MICRODISCECTOMY Right 11/26/2015   Procedure: Extraforaminal Microdiscectomy  - Lumbar two-three-  right;  Surgeon: Alm GORMAN Molt, MD;  Location: [facility];  Service: Neurosurgery;  Laterality: Right;  right    LUMBAR WOUND DEBRIDEMENT N/A 10/25/2016   Procedure: Lumbar wound revision;  Surgeon: Alm GORMAN Molt, MD;  Location: [facility] OR;  Service: Neurosurgery;  Laterality: N/A;  Lumbar wound revision   Right shoulder surgery  2010   spur   RIGHT/LEFT HEART CATH AND CORONARY ANGIOGRAPHY N/A 10/05/2020   Procedure: RIGHT/LEFT HEART CATH AND CORONARY ANGIOGRAPHY;  Surgeon: Jordan, Peter M, MD;  Location: [facility] INVASIVE CV LAB;  Service: Cardiovascular;  Laterality: N/A;   SPINAL CORD STIMULATOR REMOVAL  02/19/2023   Procedure: LUMBAR SPINAL CORD STIMULATOR REMOVAL;  Surgeon: Molt Alm GORMAN, MD;  Location: [facility] OR;  Service: Neurosurgery;;    Social History  reports that she has never smoked. She has never used smokeless tobacco.  She reports that she does not currently use alcohol. She reports that she does not use drugs.  Allergies  Allergen Reactions   Atorvastatin Calcium      Other Reaction(s): Not available  atorvastatin calcium    Oxycodone  Other (See Comments)    Confusion, staring episodes  States she is not allergic.    Atorvastatin Nausea And Vomiting and Other (See Comments)    MYALGIAS   Pentazocine Other (See Comments)    headache  Other Reaction(s): Not available  pentazocine   Cefepime  Other (See Comments)    9/29 - had confusion again with retrial of cefepime   Presumed AMS/neurotoxicity in the setting of AKI  cefepime    Gabapentin  Swelling   Other Nausea Only    UNSPECIFIED Anesthesia    Family History  Problem Relation Age of Onset   Lung cancer Father        smoked   Hypertension Father    Stroke Father    Heart failure Mother    Bone cancer Sister    Asthma Sister   Reviewed on admission  Prior to Admission medications   Medication Sig Start Date End Date Taking? Authorizing Provider  carvedilol  (COREG ) 6.25 MG tablet Take 1 tablet (6.25 mg total) by mouth 2 (two) times daily with a meal. 05/26/24   Carlin Delon BROCKS, NP  ciprofloxacin  (CIPRO ) 500 MG tablet Take 1 tablet (500 mg total) by mouth 2 (two) times daily. 06/30/24   Manandhar, Sabina, MD  diclofenac  Sodium (VOLTAREN ) 1 % GEL Apply 2 g topically 4 (four) times daily. Patient taking differently: Apply 2 g topically 4 (four) times daily as needed (Pain). 01/31/24   Love, Sharlet GORMAN, PA-C  doxycycline  (VIBRA -TABS) 100 MG tablet Take 1 tablet (100 mg total) by mouth 2 (two) times daily. 02/21/24   Manandhar, Sabina, MD  DULoxetine  (CYMBALTA ) 60 MG capsule Take 1 capsule (60 mg total) by mouth 2 (two) times daily. 07/03/24   Emeline Joesph BROCKS, DO  Lidocaine -Menthol  (NERVIVE ROLL-ON EX) Apply 1 Application topically as needed (Pain).    [provider]  melatonin 5 MG TABS Take 1 tablet (5 mg total) by mouth at bedtime as  needed. 01/31/24   Love, Sharlet GORMAN, PA-C  methocarbamol  (ROBAXIN ) 500 MG tablet Take 500 mg by mouth at bedtime.    [provider]  mirabegron  ER (MYRBETRIQ ) 25 MG TB24 tablet Take 1 tablet (25 mg total) by mouth daily for 14 days, THEN 2 tablets (50 mg total) daily for 16 days. If you do not pee for > 6 hours, stop medication and call your physician. Patient taking differently: Take 50mg  (2 tablets)  by mouth once daily. 05/07/24 07/04/24  Emeline Joesph BROCKS, DO  mirtazapine  (REMERON ) 30 MG tablet Take 1 tablet (30 mg total) by mouth at bedtime. 07/08/24   Arlice Reichert, MD  Multiple Vitamin (MULTIVITAMIN WITH MINERALS) TABS tablet Take 1 tablet by mouth daily.    [provider]  Multiple Vitamins-Minerals (HAIR SKIN AND NAILS FORMULA) TABS Take 1 tablet by mouth daily. 10/17/18   [provider]  nitroGLYCERIN  (NITROSTAT ) 0.4 MG SL tablet Place 1 tablet (0.4 mg total) under the tongue every 5 (five) minutes as needed for chest pain. 07/23/24 10/21/24  Carlin Delon BROCKS, NP  NOVOLOG  RELION 100 UNIT/ML injection Inject 2-20 Units into the skin 3 (three) times daily with meals. 02/01/24   Love, Sharlet RAMAN, PA-C  oxyCODONE  (OXY IR/ROXICODONE ) 5 MG immediate release tablet Take 1 tablet (5 mg total) by mouth every 6 (six) hours as needed for moderate pain (pain score 4-6). 07/08/24   Arlice Reichert, MD  pantoprazole  (PROTONIX ) 40 MG tablet Take 1 tablet by mouth once daily 04/01/24   Monetta Redell PARAS, MD  polyethylene glycol powder (GLYCOLAX /MIRALAX ) 17 GM/SCOOP powder Take 17 g by mouth daily. Patient taking differently: Take 17 g by mouth daily as needed for moderate constipation. 01/31/24   Love, Sharlet RAMAN, PA-C  pramipexole  (MIRAPEX ) 1 MG tablet Take 1 mg by mouth at bedtime.    [provider]  pregabalin  (LYRICA ) 75 MG capsule Take 1 capsule (75 mg total) by mouth 3 (three) times daily. 01/31/24   Love, Sharlet RAMAN, PA-C  senna-docusate (SENOKOT-S) 8.6-50 MG tablet Take 2 tablets by  mouth 2 (two) times daily. 01/31/24   Love, Sharlet RAMAN, PA-C  tirzepatide [facility]) 5 MG/0.5ML Pen Inject 7 mg into the skin once a week. 06/06/23   [provider]  torsemide  (DEMADEX ) 20 MG tablet Take 1 tablet by mouth twice daily 04/21/24   Woody, Jennifer C, NP  TRESIBA  FLEXTOUCH 200 UNIT/ML FlexTouch Pen Inject 40 Units into the skin daily with breakfast. Patient taking differently: Inject 40 Units into the skin at bedtime. 02/01/24   Maurice Sharlet RAMAN, PA-C    Physical Exam: Vitals:   08/03/24 1210 08/03/24 1215 08/03/24 1222  BP: (!) 154/115 (!) 173/91   Pulse:  97 87  Resp: 17 (!) 22 (!) 21  SpO2:  100% 100%    Physical Exam Constitutional:      General: She is not in acute distress.    Appearance: Normal appearance. She is obese.  HENT:     Head: Normocephalic and atraumatic.     Mouth/Throat:     Mouth: Mucous membranes are moist.     Pharynx: Oropharynx is clear.  Eyes:     Extraocular Movements: Extraocular movements intact.     Pupils: Pupils are equal, round, and reactive to light.  Cardiovascular:     Rate and Rhythm: Normal rate and regular rhythm.     Pulses: Normal pulses.     Heart sounds: Normal heart sounds.  Pulmonary:     Effort: Pulmonary effort is normal. No respiratory distress.     Breath sounds: Decreased breath sounds present.  Abdominal:     General: Bowel sounds are normal. There is no distension.     Palpations: Abdomen is soft.     Tenderness: There is no abdominal tenderness.  Musculoskeletal:        General: No swelling or deformity.  Skin:    General: Skin is warm and dry.  Neurological:  General: No focal deficit present.     Mental Status: Mental status is at baseline.    Labs on Admission: I have personally reviewed following labs and imaging studies  CBC: Recent Labs  Lab 08/03/24 1251 08/03/24 1308  WBC 6.2  --   NEUTROABS 4.9  --   HGB 9.9* 10.2*  9.9*  HCT 31.2* 30.0*  29.0*  MCV 92.0  --   PLT 144*  --      Basic Metabolic Panel: Recent Labs  Lab 08/03/24 1251 08/03/24 1308  NA 138 141  141  K 4.3 4.3  4.3  CL 105 106  CO2 23  --   GLUCOSE 166* 167*  BUN 21 25*  CREATININE 0.93 1.00  CALCIUM  8.8*  --     GFR: CrCl cannot be calculated (Unknown ideal weight.).  Liver Function Tests: Recent Labs  Lab 08/03/24 1251  AST 55*  ALT 32  ALKPHOS 206*  BILITOT 0.7  PROT 6.6  ALBUMIN  2.8*    Urine analysis:    Component Value Date/Time   COLORURINE STRAW (A) 03/13/2023 1927   APPEARANCEUR CLEAR 03/13/2023 1927   LABSPEC 1.005 03/13/2023 1927   PHURINE 6.0 03/13/2023 1927   GLUCOSEU NEGATIVE 03/13/2023 1927   HGBUR MODERATE (A) 03/13/2023 1927   BILIRUBINUR negative 07/02/2024 1616   KETONESUR negative 07/02/2024 1616   KETONESUR NEGATIVE 03/13/2023 1927   PROTEINUR negative 07/02/2024 1616   PROTEINUR 30 (A) 03/13/2023 1927   UROBILINOGEN 0.2 07/02/2024 1616   UROBILINOGEN 0.2 01/31/2012 1504   NITRITE Negative 07/02/2024 1616   NITRITE NEGATIVE 03/13/2023 1927   LEUKOCYTESUR Negative 07/02/2024 1616   LEUKOCYTESUR NEGATIVE 03/13/2023 1927    Radiological Exams on Admission: DG Chest Port 1 View Result Date: 08/03/2024 CLINICAL DATA:  Shortness of breath. EXAM: PORTABLE CHEST 1 VIEW COMPARISON:  Radiograph and CT 07/04/2024 FINDINGS: Low lung volumes. The heart is mildly enlarged. Diffuse pulmonary edema is new from prior exam. Small pleural effusions. No confluent opacity or pneumothorax. Spinal stimulator and thoracic spine hardware. Anterior dislocation of the humerus with respect to the glenoid. Suggestion of progressive erosive changes from shoulder radiograph 07/05/2024 IMPRESSION: 1. Congestive heart failure with cardiomegaly, pulmonary edema and small pleural effusions. 2. 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 The possibility of septic arthritis is again  raised. Electronically Signed   By: Andrea Gasman M.D.   On: 08/03/2024 12:30   EKG: Independently reviewed.  Sinus tachycardia at 100 beats minute.  Left bundle branch block.  Similar to previous.  Assessment/Plan Principal Problem:   Acute on chronic respiratory failure with hypoxia (HCC) Active Problems:   Recurrent major depression in remission   Restless leg   GERD (gastroesophageal reflux disease)   Hyperlipidemia   Iron  deficiency anemia due to chronic blood loss   OSA (obstructive sleep apnea)   Chronic diastolic CHF (congestive heart failure) (HCC)   DM2 (diabetes mellitus, type 2) (HCC)   Restless leg syndrome   Neuropathy   Essential hypertension   CKD stage 3b, GFR 30-44 ml/min (HCC)   CAD (coronary artery disease)   Chronic hypoxemic respiratory failure (HCC)   Type 2 diabetes mellitus with diabetic neuropathy, unspecified (HCC)   Hypertensive heart disease with congestive heart failure and chronic kidney disease (HCC)   Peripheral artery disease   GAD (generalized anxiety disorder)   Obesity (BMI 30-39.9)   Anemia of chronic disease   Dyslipidemia  Status post insertion of spinal cord stimulator   Thoracic spondylosis with myelopathy   Chronic pain syndrome   Hepatic cirrhosis (HCC)   Migraine without aura and responsive to treatment   Acute on chronic diastolic CHF (congestive heart failure) (HCC)   Acute on chronic respiratory failure with hypoxia Acute on chronic diastolic CHF > Presenting with 2 to 3 days of worsening shortness of breath.  Has some associated chest discomfort.  Initially blood pressure in the 240s for EMS.  Received DuoNeb, aspirin , nitro and route. > Placed on BiPAP in the ED.  Received additional DuoNeb, dose of Solu-Medrol  and started on Lasix . > Chest x-ray concerning for CHF with cardiomegaly, pulmonary edema, small pleural effusions. > BNP mildly elevated at 193 though this is likely falsely low in the setting of obesity.  Troponin  30 with repeat pending. > Last echo was last month with a EF 60-65%, indeterminate diastolic function, normal RV function. - Monitor in progressive unit overnight - Continue with BiPAP, wean as tolerated - Continue with Lasix  60 mg IV twice daily - Strict I's and O's, daily weights - Hold off on repeat echocardiogram - Check magnesium  - Trend renal function and electrolytes - Continue home Coreg   History of discitis status post hardware removal Recent shoulder abscess - Continue home ciprofloxacin  and doxycycline   Hypertension - Diuresis and Coreg  as above  Hyperlipidemia CAD PAD - Not on antilipid medication - Continue home Coreg   Diabetes > 40 units qhs at home and mealtime insulin  - 30 units qhs - SSI  Neuropathy - Continue home Lyrica   NASH cirrhosis > AST stable at 55, ALT normal, alk phos stable at 206.  Platelets stable at 144. - Continue on Lasix  as above  Depression Anxiety - Continue home duloxetine  and Remeron   RLS - Continue home pramipexole   Restrictive lung disease Obesity OSA on CPAP - Continue CPAP  T-spine and L-spine disease Chronic pain > Status post fusion  > Status post hardware removal due to infection as above - Continue home oxycodone    DVT prophylaxis: Lovenox  Code Status:   DNR/DNI  Family Communication:  Updated at bedside  Disposition Plan:   Patient is from:  Home  Anticipated DC to:  Home  Anticipated DC date:  2 to 4 days  Anticipated DC barriers: None  Consults called:  None Admission status:  Inpatient, progressive  Severity of Illness: The appropriate patient status for this patient is INPATIENT. Inpatient status is judged to be reasonable and necessary in order to provide the required intensity of service to ensure the patient's safety. The patient's presenting symptoms, physical exam findings, and initial radiographic and laboratory data in the context of their chronic comorbidities is felt to place them at high  risk for further clinical deterioration. Furthermore, it is not anticipated that the patient will be medically stable for discharge from the hospital within 2 midnights of admission.   * I certify that at the point of admission it is my clinical judgment that the patient will require inpatient hospital care spanning beyond 2 midnights from the point of admission due to high intensity of service, high risk for further deterioration and high frequency of surveillance required.DEWAINE Marsa Katelyn Seena MD [facility]  How to contact the [facility] Attending or Consulting provider 7A - 7P or covering provider during after hours 7P -7A, for this patient?   Check the care team in [facility] and look for a) attending/consulting [facility] provider listed and b) the [facility] team  listed Log into [email or web address] and use [place]'s universal password to access. If you do not have the password, please contact the hospital operator. Locate the [facility] provider you are looking for under [facility] and page to a number that you can be directly reached. If you still have difficulty reaching the provider, please page the [facility] (Director on Call) for the Hospitalists listed on amion for assistance.  08/03/2024, 3:40 PM

## 2024-08-03 NOTE — ED Provider Notes (Signed)
 [place] [facility] AT [facility] Provider Note   CSN: [id number] Arrival date & time: 08/03/24  1150     Patient presents with: No chief complaint on file.   Katelyn [name] is a 68 y.o. female.  {Add pertinent medical, surgical, social history, OB history to HPI:3220} 68 year old female with prior medical history as detailed below presents for evaluation.  Patient reports 2 to 3 days of worsening shortness of breath.  She called EMS this afternoon secondary to continued shortness of breath.  She complains of some sternal chest discomfort as well.  EMS administered 2 DuoNeb treatments, 324 mg aspirin , 1 nitroglycerin  prior to arrival.  Patient was noted to be hypertensive in the field, systolic of 240.  PMH significant for obesity, OSA, DM2, HTN, HLD, CAD, CHF, CKD, liver cirrhosis, GERD/esophageal stricture, chronic anemia, anxiety/depression.  The history is provided by the patient and medical records.       Prior to Admission medications   Medication Sig Start Date End Date Taking? Authorizing Provider  carvedilol  (COREG ) 6.25 MG tablet Take 1 tablet (6.25 mg total) by mouth 2 (two) times daily with a meal. 05/26/24   Carlin Delon BROCKS, NP  ciprofloxacin  (CIPRO ) 500 MG tablet Take 1 tablet (500 mg total) by mouth 2 (two) times daily. 06/30/24   Manandhar, Sabina, MD  diclofenac  Sodium (VOLTAREN ) 1 % GEL Apply 2 g topically 4 (four) times daily. Patient taking differently: Apply 2 g topically 4 (four) times daily as needed (Pain). 01/31/24   Love, Sharlet RAMAN, PA-C  doxycycline  (VIBRA -TABS) 100 MG tablet Take 1 tablet (100 mg total) by mouth 2 (two) times daily. 02/21/24   Manandhar, Sabina, MD  DULoxetine  (CYMBALTA ) 60 MG capsule Take 1 capsule (60 mg total) by mouth 2 (two) times daily. 07/03/24   Emeline Joesph BROCKS, DO  Lidocaine -Menthol  (NERVIVE ROLL-ON EX) Apply 1 Application topically as needed (Pain).    [provider]  melatonin 5 MG TABS Take 1 tablet  (5 mg total) by mouth at bedtime as needed. 01/31/24   Love, Sharlet RAMAN, PA-C  methocarbamol  (ROBAXIN ) 500 MG tablet Take 500 mg by mouth at bedtime.    [provider]  mirabegron  ER (MYRBETRIQ ) 25 MG TB24 tablet Take 1 tablet (25 mg total) by mouth daily for 14 days, THEN 2 tablets (50 mg total) daily for 16 days. If you do not pee for > 6 hours, stop medication and call your physician. Patient taking differently: Take 50mg  (2 tablets) by mouth once daily. 05/07/24 07/04/24  Emeline Joesph C, DO  mirtazapine  (REMERON ) 30 MG tablet Take 1 tablet (30 mg total) by mouth at bedtime. 07/08/24   Arlice Reichert, MD  Multiple Vitamin (MULTIVITAMIN WITH MINERALS) TABS tablet Take 1 tablet by mouth daily.    [provider]  Multiple Vitamins-Minerals (HAIR SKIN AND NAILS FORMULA) TABS Take 1 tablet by mouth daily. 10/17/18   [provider]  nitroGLYCERIN  (NITROSTAT ) 0.4 MG SL tablet Place 1 tablet (0.4 mg total) under the tongue every 5 (five) minutes as needed for chest pain. 07/23/24 10/21/24  Carlin Delon BROCKS, NP  NOVOLOG  RELION 100 UNIT/ML injection Inject 2-20 Units into the skin 3 (three) times daily with meals. 02/01/24   Love, Sharlet RAMAN, PA-C  oxyCODONE  (OXY IR/ROXICODONE ) 5 MG immediate release tablet Take 1 tablet (5 mg total) by mouth every 6 (six) hours as needed for moderate pain (pain score 4-6). 07/08/24   Arlice Reichert, MD  pantoprazole  (PROTONIX ) 40 MG  tablet Take 1 tablet by mouth once daily 04/01/24   Monetta Redell PARAS, MD  polyethylene glycol powder (GLYCOLAX /MIRALAX ) 17 GM/SCOOP powder Take 17 g by mouth daily. Patient taking differently: Take 17 g by mouth daily as needed for moderate constipation. 01/31/24   Love, Sharlet RAMAN, PA-C  pramipexole  (MIRAPEX ) 1 MG tablet Take 1 mg by mouth at bedtime.    [provider]  pregabalin  (LYRICA ) 75 MG capsule Take 1 capsule (75 mg total) by mouth 3 (three) times daily. 01/31/24   Love, Sharlet RAMAN, PA-C  senna-docusate (SENOKOT-S)  8.6-50 MG tablet Take 2 tablets by mouth 2 (two) times daily. 01/31/24   Love, Sharlet RAMAN, PA-C  tirzepatide [facility]) 5 MG/0.5ML Pen Inject 7 mg into the skin once a week. 06/06/23   [provider]  torsemide  (DEMADEX ) 20 MG tablet Take 1 tablet by mouth twice daily 04/21/24   Carlin Delon BROCKS, NP  TRESIBA  FLEXTOUCH 200 UNIT/ML FlexTouch Pen Inject 40 Units into the skin daily with breakfast. Patient taking differently: Inject 40 Units into the skin at bedtime. 02/01/24   Love, Sharlet RAMAN, PA-C    Allergies: Atorvastatin calcium , Oxycodone , Atorvastatin, Pentazocine, Cefepime , Gabapentin , and Other    Review of Systems  All other systems reviewed and are negative.   Updated Vital Signs BP (!) 173/91   Pulse 87   Resp (!) 21   SpO2 100%   Physical Exam Vitals and nursing note reviewed.  Constitutional:      General: She is not in acute distress.    Appearance: She is well-developed.  HENT:     Head: Normocephalic and atraumatic.  Eyes:     Conjunctiva/sclera: Conjunctivae normal.  Cardiovascular:     Rate and Rhythm: Regular rhythm. Tachycardia present.     Heart sounds: No murmur heard. Pulmonary:     Effort: No respiratory distress.     Comments: Increased work of breathing with associated tachypnea, decreased breath sounds bilaterally at bases with mild rhonchi and wheezing. Abdominal:     Palpations: Abdomen is soft.     Tenderness: There is no abdominal tenderness.  Musculoskeletal:        General: No swelling.     Cervical back: Neck supple.  Skin:    General: Skin is warm and dry.     Capillary Refill: Capillary refill takes less than 2 seconds.  Neurological:     General: No focal deficit present.     Mental Status: She is alert.  Psychiatric:        Mood and Affect: Mood normal.     (all labs ordered are listed, but only abnormal results are displayed) Labs Reviewed  RESP PANEL BY RT-PCR (RSV, FLU A&B, COVID)  RVPGX2  CBC WITH DIFFERENTIAL/PLATELET   BRAIN NATRIURETIC PEPTIDE  COMPREHENSIVE METABOLIC PANEL WITH GFR  URINALYSIS, W/ REFLEX TO CULTURE (INFECTION SUSPECTED)  I-STAT CHEM 8, ED  I-STAT CG4 LACTIC ACID, ED  I-STAT VENOUS BLOOD GAS, ED  TROPONIN I (HIGH SENSITIVITY)    EKG: EKG Interpretation Date/Time:  Sunday August 03 2024 12:07:21 EDT Ventricular Rate:  111 PR Interval:  145 QRS Duration:  128 QT Interval:  361 QTC Calculation: 491 R Axis:   13  Text Interpretation: Sinus tachycardia Left bundle branch block Confirmed by Laurice Coy [phone]) on 08/03/2024 12:13:08 PM  Radiology: [facility] Chest Port 1 View Result Date: 08/03/2024 CLINICAL DATA:  Shortness of breath. EXAM: PORTABLE CHEST 1 VIEW COMPARISON:  Radiograph and CT 07/04/2024 FINDINGS: Low lung volumes.  The heart is mildly enlarged. Diffuse pulmonary edema is new from prior exam. Small pleural effusions. No confluent opacity or pneumothorax. Spinal stimulator and thoracic spine hardware. Anterior dislocation of the humerus with respect to the glenoid. Suggestion of progressive erosive changes from shoulder radiograph 07/05/2024 IMPRESSION: 1. Congestive heart failure with cardiomegaly, pulmonary edema and small pleural effusions. 2. 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 The possibility of septic arthritis is again raised. Electronically Signed   By: Andrea Gasman M.D.   On: 08/03/2024 12:30    {Document cardiac monitor, telemetry assessment procedure when appropriate:32947} Procedures   Medications Ordered in the ED  ipratropium-albuterol  (DUONEB) 0.5-2.5 (3) MG/3ML nebulizer solution 3 mL (3 mLs Nebulization Given 08/03/24 1222)  methylPREDNISolone  sodium succinate (SOLU-MEDROL ) 125 mg/2 mL injection 125 mg (125 mg Intravenous Given 08/03/24 1250)      {Click here for ABCD2, HEART and other calculators REFRESH Note before signing:1}                               Medical Decision Making Patient is presenting with gradually worsening dyspnea.  Patient placed on BiPAP on arrival for increased work of breathing.  With BiPAP patient is much improved and more comfortable.  Exam suggests likely fluid overload.  Patient with some wheezing noted on exam.  Solu-Medrol  and DuoNeb treatment administered.  Imaging and workup suggest fluid overload.  Lasix  administered IV.  Patient would benefit from admission for further workup and likely diuresis.      Amount and/or Complexity of Data Reviewed Labs: ordered. Radiology: ordered.  Risk Prescription drug management.   CRITICAL CARE Performed by: Maude JAYSON Galloway   Total critical care time: 30 minutes  Critical care time was exclusive of separately billable procedures and treating other patients.  Critical care was necessary to treat or prevent imminent or life-threatening deterioration.  Critical care was time spent personally by me on the following activities: development of treatment plan with patient and/or surrogate as well as nursing, discussions with consultants, evaluation of patient's response to treatment, examination of patient, obtaining history from patient or surrogate, ordering and performing treatments and interventions, ordering and review of laboratory studies, ordering and review of radiographic studies, pulse oximetry and re-evaluation of patient's condition.   {Document critical care time when appropriate  Document review of labs and clinical decision tools ie [id number], etc  Document your independent review of radiology images and any outside records  Document your discussion with family members, caretakers and with consultants  Document social determinants of health affecting pt's care  Document your decision making why or why not admission, treatments were needed:32947:::1}   Final diagnoses:  Dyspnea, unspecified type    ED Discharge Orders     None

## 2024-08-03 NOTE — ED Notes (Signed)
 This nurse noted that patient was having more difficulty breathing. This nurse called RT to have patient placed back on the BiPAP.

## 2024-08-03 NOTE — Progress Notes (Signed)
 RT note. Patient placed on 2 L [place] at this time, sat 100% with no labored breathing noted, audible wheeze is also gone. RT will continue to monitor.    08/03/24 1809  Therapy Vitals  Pulse Rate (!) 101  Resp 16  MEWS Score/Color  MEWS Score 1  MEWS Score Color Green  Respiratory Assessment  Assessment Type Assess only  Respiratory Pattern Regular;Unlabored  Chest Assessment Chest expansion symmetrical  Oxygen  Therapy/Pulse Ox  O2 Device Nasal Cannula  SpO2 100 %  O2 Therapy Oxygen   O2 Flow Rate (L/min) 2 L/min

## 2024-08-03 NOTE — ED Notes (Signed)
 Patient provided with ginger ale and sandwich bag. Call bell within reach, instructed to press button with needing something. Patient verbalized understanding.

## 2024-08-03 NOTE — ED Triage Notes (Signed)
 Pt [facility] [place] [facility] from home for c/o [facility] that started this morning when she woke up and central chest pain, reports it was hard for her to catch her breath. 2 duonebs, 324 aspirin  and 1 nitroglycerin  given PTA  HR 110 240/120

## 2024-08-03 NOTE — Progress Notes (Signed)
 RT place on BiPAP due to increased WOB. Patient is resting comfortably. Will continue to monitor.    08/03/24 2300  BiPAP/CPAP/SIPAP  $ Non-Invasive Ventilator  Non-Invasive Vent Subsequent  BiPAP/CPAP/SIPAP Pt Type Adult  BiPAP/CPAP/SIPAP SERVO  Mask Type Full face mask  Dentures removed? Not applicable  Mask Size Medium  Set Rate 12 breaths/min  Respiratory Rate 18 breaths/min  IPAP 10 cmH20  EPAP 5 cmH2O  PEEP 5 cmH20  FiO2 (%) 40 %  Minute Ventilation 27.5  Leak 24  Peak Inspiratory Pressure (PIP) 10  Tidal Volume (Vt) 576  Patient Home Machine No  Patient Home Mask No  Patient Home Tubing No  Auto Titrate No  Press High Alarm 25 cmH2O  CPAP/SIPAP surface wiped down Yes  Device Plugged into RED Power Outlet Yes  Oxygen  Percent 40 %  BiPAP/CPAP /SiPAP Vitals  Pulse Rate (!) 112  Resp 18  BP 123/64  SpO2 100 %  Bilateral Breath Sounds Diminished  MEWS Score/Color  MEWS Score 2  MEWS Score Color Yellow

## 2024-08-04 ENCOUNTER — Encounter (HOSPITAL_COMMUNITY): Payer: Self-pay | Admitting: Internal Medicine

## 2024-08-04 ENCOUNTER — Other Ambulatory Visit: Payer: Self-pay

## 2024-08-04 DIAGNOSIS — I5031 Acute diastolic (congestive) heart failure: Secondary | ICD-10-CM

## 2024-08-04 DIAGNOSIS — J9621 Acute and chronic respiratory failure with hypoxia: Secondary | ICD-10-CM | POA: Diagnosis not present

## 2024-08-04 HISTORY — DX: Acute diastolic (congestive) heart failure: I50.31

## 2024-08-04 LAB — CBC
HCT: 26.2 % — ABNORMAL LOW (ref 36.0–46.0)
Hemoglobin: 8.4 g/dL — ABNORMAL LOW (ref 12.0–15.0)
MCH: 29.6 pg (ref 26.0–34.0)
MCHC: 32.1 g/dL (ref 30.0–36.0)
MCV: 92.3 fL (ref 80.0–100.0)
Platelets: 134 K/uL — ABNORMAL LOW (ref 150–400)
RBC: 2.84 MIL/uL — ABNORMAL LOW (ref 3.87–5.11)
RDW: 15.9 % — ABNORMAL HIGH (ref 11.5–15.5)
WBC: 4.7 K/uL (ref 4.0–10.5)
nRBC: 0 % (ref 0.0–0.2)

## 2024-08-04 LAB — URINALYSIS, W/ REFLEX TO CULTURE (INFECTION SUSPECTED)
Bilirubin Urine: NEGATIVE
Glucose, UA: NEGATIVE mg/dL
Hgb urine dipstick: NEGATIVE
Ketones, ur: NEGATIVE mg/dL
Leukocytes,Ua: NEGATIVE
Nitrite: NEGATIVE
Protein, ur: NEGATIVE mg/dL
Specific Gravity, Urine: 1.011 (ref 1.005–1.030)
pH: 5 (ref 5.0–8.0)

## 2024-08-04 LAB — COMPREHENSIVE METABOLIC PANEL WITH GFR
ALT: 25 U/L (ref 0–44)
AST: 46 U/L — ABNORMAL HIGH (ref 15–41)
Albumin: 2.5 g/dL — ABNORMAL LOW (ref 3.5–5.0)
Alkaline Phosphatase: 162 U/L — ABNORMAL HIGH (ref 38–126)
Anion gap: 7 (ref 5–15)
BUN: 26 mg/dL — ABNORMAL HIGH (ref 8–23)
CO2: 25 mmol/L (ref 22–32)
Calcium: 8.1 mg/dL — ABNORMAL LOW (ref 8.9–10.3)
Chloride: 104 mmol/L (ref 98–111)
Creatinine, Ser: 1.12 mg/dL — ABNORMAL HIGH (ref 0.44–1.00)
GFR, Estimated: 54 mL/min — ABNORMAL LOW (ref >60)
Glucose, Bld: 285 mg/dL — ABNORMAL HIGH (ref 70–99)
Potassium: 4.5 mmol/L (ref 3.5–5.1)
Sodium: 136 mmol/L (ref 135–145)
Total Bilirubin: 0.6 mg/dL (ref 0.0–1.2)
Total Protein: 5.7 g/dL — ABNORMAL LOW (ref 6.5–8.1)

## 2024-08-04 LAB — CBG MONITORING, ED
Glucose-Capillary: 210 mg/dL — ABNORMAL HIGH (ref 70–99)
Glucose-Capillary: 149 mg/dL — ABNORMAL HIGH (ref 70–99)

## 2024-08-04 LAB — GLUCOSE, CAPILLARY
Glucose-Capillary: 172 mg/dL — ABNORMAL HIGH (ref 70–99)
Glucose-Capillary: 193 mg/dL — ABNORMAL HIGH (ref 70–99)

## 2024-08-04 MED ORDER — MAGNESIUM SULFATE 2 GM/50ML IV SOLN
2.0000 g | Freq: Once | INTRAVENOUS | Status: AC
  Administered 2024-08-04: 2 g via INTRAVENOUS
  Filled 2024-08-04: qty 50

## 2024-08-04 MED ORDER — SPIRONOLACTONE 25 MG PO TABS
25.0000 mg | ORAL_TABLET | Freq: Every day | ORAL | Status: DC
  Administered 2024-08-04 – 2024-08-11 (×8): 25 mg via ORAL
  Filled 2024-08-04 (×8): qty 1

## 2024-08-04 NOTE — Progress Notes (Addendum)
 PROGRESS NOTE    JONNELLE LAWNICZAK  FMW:[id number] DOB: 11/28/1955 DOA: 08/03/2024 PCP: Ofilia Lamar CROME, MD    68/F chronically ill with diastolic CHF, restrictive lung disease, NASH/cirrhosis, hypertension, diabetes, CAD, PAD, nonsustained V. tach, migraines, depression, RLS, thoracic and lumbar spine disease history of spinal cord stimulator, fusion, numerous surgeries, history of discitis with hardware removal presented to the ED with worsening dyspnea and orthopnea. - Recently admitted 9/25-9/30 with right shoulder abscess, in the background of known discitis sp hardware removal on long-term doxycycline  and ciprofloxacin .  She was treated with IV antibiotics in the hospital in consultation with infectious disease and transitioned back to doxycycline  and ciprofloxacin  on discharge.  Did undergo some diuresis due to volume overload while admitted. -Now with dyspnea, in the ED was in respiratory distress, hypertensive, placed on BiPAP, lab work noted hemoglobin 9.9, creatinine 1.0, BNP 193, troponin 30, Chest x-ray showed changes consistent with CHF including cardiomegaly, pulmonary edema, small effusions.  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  Subjective: Off BiPAP this morning  Assessment and Plan:  Acute on chronic respiratory failure with hypoxia Acute on chronic diastolic CHF -Presenting with dyspnea on exertion, and respiratory distress in the ED, placed on BiPAP -Last echo was 9/25 noted EF 60-65%, indeterminate diastolic function, normal RV function. - Weaned off BiPAP, continue Lasix  60 mg twice daily, Coreg  - Add Aldactone  Hypomagnesemia Replete and recheck in a.m.   History of discitis status post hardware removal Recent shoulder abscess - Continue home ciprofloxacin  and doxycycline  -Significant debility,  follow-up with orthopedics - Has essentially become bedbound since December   Hypertension - Diuresis and Coreg  as above    Hyperlipidemia CAD PAD - Not on antilipid medication - Continue home Coreg    Diabetes > 40 units qhs at home and mealtime insulin  - Increase glargine and meal coverage   Neuropathy - Continue home Lyrica    NASH cirrhosis -Diuretics as noted above, albumin  is 2.5   Depression Anxiety - Continue home duloxetine  and Remeron    RLS - Continue home pramipexole    Restrictive lung disease Obesity OSA on CPAP - Continue CPAP   T-spine and L-spine disease Chronic pain -History of fusion , sp hardware removal due to infection as above - Continue home oxycodone    Goals: 68/F with chronic diastolic CHF, NASH/cirrhosis, numerous back surgeries, discitis osteomyelitis, shoulder abscess, restrictive lung disease, with ongoing failure to thrive, will request palliative consult for goals of care   DVT prophylaxis:      Lovenox  Code Status:              DNR/DNI  Family Communication:   Spouse    updated at bedside  Disposition Plan: Home pending improvement in volume status Consultants:    Procedures:   Antimicrobials:    Objective: Vitals:   08/04/24 0600 08/04/24 0646 08/04/24 0900 08/04/24 1051  BP: 125/69  126/71   Pulse: (!) 101  (!) 103   Resp: 12  20   Temp:  99 F (37.2 C)  98.1 F (36.7 C)  TempSrc:  Oral  Oral  SpO2: 100%  100%     Intake/Output Summary (Last 24 hours) at 08/04/2024 1244 Last data filed at 08/03/2024 2148 Gross per 24 hour  Intake 3 ml  Output 300 ml  Net -297 ml   There were no vitals filed for this visit.  Examination:  General exam: Appears calm and comfortable  Respiratory system: Clear to auscultation Cardiovascular system: S1 & S2  heard, RRR.  Abd: nondistended, soft and nontender.Normal bowel sounds heard. Central nervous system: Alert and oriented. No focal neurological deficits. Extremities: no edema Skin: No rashes Psychiatry:  Mood & affect appropriate.     Data Reviewed:   CBC: Recent Labs  Lab 08/03/24 1251  08/03/24 1308 08/04/24 0251  WBC 6.2  --  4.7  NEUTROABS 4.9  --   --   HGB 9.9* 10.2*  9.9* 8.4*  HCT 31.2* 30.0*  29.0* 26.2*  MCV 92.0  --  92.3  PLT 144*  --  134*   Basic Metabolic Panel: Recent Labs  Lab 08/03/24 1251 08/03/24 1308 08/04/24 0251  NA 138 141  141 136  K 4.3 4.3  4.3 4.5  CL 105 106 104  CO2 23  --  25  GLUCOSE 166* 167* 285*  BUN 21 25* 26*  CREATININE 0.93 1.00 1.12*  CALCIUM  8.8*  --  8.1*  MG 1.5*  --   --    GFR: CrCl cannot be calculated (Unknown ideal weight.). Liver Function Tests: Recent Labs  Lab 08/03/24 1251 08/04/24 0251  AST 55* 46*  ALT 32 25  ALKPHOS 206* 162*  BILITOT 0.7 0.6  PROT 6.6 5.7*  ALBUMIN  2.8* 2.5*   No results for input(s): LIPASE, AMYLASE in the last 168 hours. No results for input(s): AMMONIA in the last 168 hours. Coagulation Profile: No results for input(s): INR, PROTIME in the last 168 hours. Cardiac Enzymes: No results for input(s): CKTOTAL, CKMB, CKMBINDEX, TROPONINI in the last 168 hours. BNP (last 3 results) No results for input(s): PROBNP in the last 8760 hours. HbA1C: No results for input(s): HGBA1C in the last 72 hours. CBG: Recent Labs  Lab 08/03/24 1747 08/03/24 2147 08/04/24 0748  GLUCAP 182* 306* 210*   Lipid Profile: No results for input(s): CHOL, HDL, LDLCALC, TRIG, CHOLHDL, LDLDIRECT in the last 72 hours. Thyroid  Function Tests: No results for input(s): TSH, T4TOTAL, FREET4, T3FREE, THYROIDAB in the last 72 hours. Anemia Panel: No results for input(s): VITAMINB12, FOLATE, FERRITIN, TIBC, IRON , RETICCTPCT in the last 72 hours. Urine analysis:    Component Value Date/Time   COLORURINE STRAW (A) 03/13/2023 1927   APPEARANCEUR CLEAR 03/13/2023 1927   LABSPEC 1.005 03/13/2023 1927   PHURINE 6.0 03/13/2023 1927   GLUCOSEU NEGATIVE 03/13/2023 1927   HGBUR MODERATE (A) 03/13/2023 1927   BILIRUBINUR negative 07/02/2024 1616    KETONESUR negative 07/02/2024 1616   KETONESUR NEGATIVE 03/13/2023 1927   PROTEINUR negative 07/02/2024 1616   PROTEINUR 30 (A) 03/13/2023 1927   UROBILINOGEN 0.2 07/02/2024 1616   UROBILINOGEN 0.2 01/31/2012 1504   NITRITE Negative 07/02/2024 1616   NITRITE NEGATIVE 03/13/2023 1927   LEUKOCYTESUR Negative 07/02/2024 1616   LEUKOCYTESUR NEGATIVE 03/13/2023 1927   Sepsis Labs: @LABRCNTIP (procalcitonin:4,lacticidven:4)  ) Recent Results (from the past 240 hours)  Resp panel by RT-PCR (RSV, Flu A&B, Covid) Anterior Nasal Swab     Status: None   Collection Time: 08/03/24 12:00 PM   Specimen: Anterior Nasal Swab  Result Value Ref Range Status   SARS Coronavirus 2 by RT PCR NEGATIVE NEGATIVE Final   Influenza A by PCR NEGATIVE NEGATIVE Final   Influenza B by PCR NEGATIVE NEGATIVE Final    Comment: (NOTE) The Xpert Xpress SARS-CoV-2/FLU/RSV plus assay is intended as an aid in the diagnosis of influenza from Nasopharyngeal swab specimens and should not be used as a sole basis for treatment. Nasal washings and aspirates are unacceptable for Xpert Xpress SARS-CoV-2/FLU/RSV testing.  Fact Sheet for Patients: bloggercourse.com  Fact Sheet for Healthcare Providers: seriousbroker.it  This test is not yet approved or cleared by the [place]  FDA and has been authorized for detection and/or diagnosis of SARS-CoV-2 by FDA under an Emergency Use Authorization (EUA). This EUA will remain in effect (meaning this test can be used) for the duration of the COVID-19 declaration under Section 564(b)(1) of the Act, 21 U.S.C. section 360bbb-3(b)(1), unless the authorization is terminated or revoked.     Resp Syncytial Virus by PCR NEGATIVE NEGATIVE Final    Comment: (NOTE) Fact Sheet for Patients: bloggercourse.com  Fact Sheet for Healthcare Providers: seriousbroker.it  This test is not yet  approved or cleared by the [place]  FDA and has been authorized for detection and/or diagnosis of SARS-CoV-2 by FDA under an Emergency Use Authorization (EUA). This EUA will remain in effect (meaning this test can be used) for the duration of the COVID-19 declaration under Section 564(b)(1) of the Act, 21 U.S.C. section 360bbb-3(b)(1), unless the authorization is terminated or revoked.  Performed at [facility], [address], [place], [place] [address]      Radiology Studies: VAS US  LOWER EXTREMITY VENOUS (DVT) (ONLY [facility] & [facility]) Result Date: 08/04/2024  Lower Venous DVT Study Patient Name:  NEERA TENG Yi  Date of Exam:   08/03/2024 Medical Rec #: [id number]    Accession #:    [id number] Date of Birth: [birth year]    Patient Gender: F Patient Age:   68 years Exam Location:  [facility] Procedure:      VAS US  LOWER EXTREMITY VENOUS (DVT) Referring Phys: MAUDE MESSICK --------------------------------------------------------------------------------  Indications: Edema, SOB, and chest pain.  Limitations: Body habitus and poor ultrasound/tissue interface. Comparison Study: Previous study on 9.26.2025. Performing Technologist: Edilia Elden Appl  Examination Guidelines: A complete evaluation includes B-mode imaging, spectral Doppler, color Doppler, and power Doppler as needed of all accessible portions of each vessel. Bilateral testing is considered an integral part of a complete examination. Limited examinations for reoccurring indications may be performed as noted. The reflux portion of the exam is performed with the patient in reverse Trendelenburg.  +---------+---------------+---------+-----------+----------+-------------------+ RIGHT    CompressibilityPhasicitySpontaneityPropertiesThrombus Aging      +---------+---------------+---------+-----------+----------+-------------------+ CFV      Full           Yes      Yes                                       +---------+---------------+---------+-----------+----------+-------------------+ SFJ      Full           Yes      Yes                                      +---------+---------------+---------+-----------+----------+-------------------+ FV Prox  Full                                                             +---------+---------------+---------+-----------+----------+-------------------+ FV Distal  Patent by color and                                                       doppler.            +---------+---------------+---------+-----------+----------+-------------------+ PFV                     Yes      Yes                  Patent by color and                                                       doppler.            +---------+---------------+---------+-----------+----------+-------------------+ POP      Full           Yes      Yes                                      +---------+---------------+---------+-----------+----------+-------------------+ PTV                                                   Not well                                                                  visualized. Patent                                                        by color.           +---------+---------------+---------+-----------+----------+-------------------+ PERO                                                  Not well visualized +---------+---------------+---------+-----------+----------+-------------------+ Right profunda vein and distal femoral vein were not well visualized, patent by color and doppler. Right peroneal veins were not visualized.  +---------+---------------+---------+-----------+----------+-------------------+ LEFT     CompressibilityPhasicitySpontaneityPropertiesThrombus Aging      +---------+---------------+---------+-----------+----------+-------------------+ CFV      Full            Yes      Yes                                      +---------+---------------+---------+-----------+----------+-------------------+ SFJ      Full  Yes      Yes                                      +---------+---------------+---------+-----------+----------+-------------------+ FV Prox  Full                                                             +---------+---------------+---------+-----------+----------+-------------------+ FV Mid   Full                                                             +---------+---------------+---------+-----------+----------+-------------------+ FV DistalFull           Yes      Yes                                      +---------+---------------+---------+-----------+----------+-------------------+ PFV      Full                                                             +---------+---------------+---------+-----------+----------+-------------------+ POP      Full           Yes      Yes                                      +---------+---------------+---------+-----------+----------+-------------------+ PTV                                                   Not well                                                                  visualized. Patent                                                        by color.           +---------+---------------+---------+-----------+----------+-------------------+ PERO  Not well visualized +---------+---------------+---------+-----------+----------+-------------------+ Anechoic structure noted in the left popliteal fossa measuring 3.9 x 1 x 2.4 cm. Left peroneal veins were not visualized.    Summary: RIGHT: - There is no evidence of deep vein thrombosis in the lower extremity. However, portions of this examination were limited- see technologist comments above.  - No cystic structure found in the popliteal fossa.   LEFT: - There is no evidence of deep vein thrombosis in the lower extremity. However, portions of this examination were limited- see technologist comments above.  - A cystic structure is found in the popliteal fossa.  *See table(s) above for measurements and observations. Electronically signed by Gaile New MD on 08/04/2024 at 10:23:45 AM.    Final    DG Chest Port 1 View Result Date: 08/03/2024 CLINICAL DATA:  Shortness of breath. EXAM: PORTABLE CHEST 1 VIEW COMPARISON:  Radiograph and CT 07/04/2024 FINDINGS: Low lung volumes. The heart is mildly enlarged. Diffuse pulmonary edema is new from prior exam. Small pleural effusions. No confluent opacity or pneumothorax. Spinal stimulator and thoracic spine hardware. Anterior dislocation of the humerus with respect to the glenoid. Suggestion of progressive erosive changes from shoulder radiograph 07/05/2024 IMPRESSION: 1. Congestive heart failure with cardiomegaly, pulmonary edema and small pleural effusions. 2. 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 The possibility of septic arthritis is again raised. Electronically Signed   By: Andrea Gasman M.D.   On: 08/03/2024 12:30     Scheduled Meds:  carvedilol   6.25 mg Oral BID WC   ciprofloxacin   500 mg Oral BID   doxycycline   100 mg Oral BID   DULoxetine   60 mg Oral BID   enoxaparin  (LOVENOX ) injection  40 mg Subcutaneous Q24H   furosemide   60 mg Intravenous BID   insulin  aspart  0-15 Units Subcutaneous TID WC   insulin  glargine-yfgn  30 Units Subcutaneous QHS   mirtazapine   30 mg Oral QHS   pantoprazole   40 mg Oral Daily   pramipexole   1 mg Oral QHS   pregabalin   75 mg Oral TID   sodium chloride  flush  3 mL Intravenous Q12H   Continuous Infusions:   LOS: 1 day    Time spent:    Sigurd Pac, MD [facility]   08/04/2024, 12:44 PM

## 2024-08-04 NOTE — Plan of Care (Signed)

## 2024-08-04 NOTE — ED Notes (Signed)
 PT to bedside commode

## 2024-08-04 NOTE — Progress Notes (Signed)
   08/04/24 2310  BiPAP/CPAP/SIPAP  $ Non-Invasive Ventilator  Non-Invasive Vent Initial  $ Face Mask Small Yes  BiPAP/CPAP/SIPAP Pt Type Adult  BiPAP/CPAP/SIPAP DREAMSTATIOND  Mask Type Full face mask  Mask Size Small  Respiratory Rate 18 breaths/min  FiO2 (%) 21 %  Patient Home Machine No  Patient Home Mask No  Patient Home Tubing No  Auto Titrate Yes  Minimum cmH2O 5 cmH2O  Maximum cmH2O 18 cmH2O  Device Plugged into RED Power Outlet Yes  BiPAP/CPAP /SiPAP Vitals  Bilateral Breath Sounds Clear;Diminished   Patient is familiar with equipment and procedure and is able to remove and replace as necessary. Patient does not have a functioning CPAP/BiPap machine at home. Needs a referral.

## 2024-08-04 NOTE — ED Notes (Signed)
 PT medicated per MAR. Breathing is even and unlabored. Call light within reach. PT repositioned for comfort.

## 2024-08-04 NOTE — Progress Notes (Addendum)
 This chaplain attempted spiritual care visit for prayer in the ED. The chaplain understands from the unit the Pt. was admitted to the floor. The chaplain will plan a revisit.  **[id number] This chaplain introduced spiritual care services to the Pt. and the Pt. husband-Jerry. The Pt. is getting settled in the room and ordering dinner.   The chaplain listened reflectively as the Pt. described what she believes may be end of life. The chaplain understands from the Pt. the primary goal of this admission is reducing fluid. The Pt. expressed her interest in learning more about home health and possibly Hospice upon discharge.   The Pt. personal goal is to have more time to celebrate God's love in her life and spend more time with family. The chaplain accepted the Pt. invitation for prayer and F/U spiritual care.   Chaplain Leeroy Hummer [phone]

## 2024-08-05 DIAGNOSIS — Z7189 Other specified counseling: Secondary | ICD-10-CM | POA: Diagnosis not present

## 2024-08-05 DIAGNOSIS — Z515 Encounter for palliative care: Secondary | ICD-10-CM | POA: Diagnosis not present

## 2024-08-05 DIAGNOSIS — J9621 Acute and chronic respiratory failure with hypoxia: Secondary | ICD-10-CM | POA: Diagnosis not present

## 2024-08-05 LAB — BASIC METABOLIC PANEL WITH GFR
Anion gap: 8 (ref 5–15)
BUN: 35 mg/dL — ABNORMAL HIGH (ref 8–23)
CO2: 26 mmol/L (ref 22–32)
Calcium: 8.1 mg/dL — ABNORMAL LOW (ref 8.9–10.3)
Chloride: 102 mmol/L (ref 98–111)
Creatinine, Ser: 1.25 mg/dL — ABNORMAL HIGH (ref 0.44–1.00)
GFR, Estimated: 47 mL/min — ABNORMAL LOW (ref >60)
Glucose, Bld: 140 mg/dL — ABNORMAL HIGH (ref 70–99)
Potassium: 3.6 mmol/L (ref 3.5–5.1)
Sodium: 136 mmol/L (ref 135–145)

## 2024-08-05 LAB — CBC
HCT: 24.9 % — ABNORMAL LOW (ref 36.0–46.0)
Hemoglobin: 7.9 g/dL — ABNORMAL LOW (ref 12.0–15.0)
MCH: 29.3 pg (ref 26.0–34.0)
MCHC: 31.7 g/dL (ref 30.0–36.0)
MCV: 92.2 fL (ref 80.0–100.0)
Platelets: 112 K/uL — ABNORMAL LOW (ref 150–400)
RBC: 2.7 MIL/uL — ABNORMAL LOW (ref 3.87–5.11)
RDW: 15.9 % — ABNORMAL HIGH (ref 11.5–15.5)
WBC: 5.3 K/uL (ref 4.0–10.5)
nRBC: 0 % (ref 0.0–0.2)

## 2024-08-05 LAB — GLUCOSE, CAPILLARY
Glucose-Capillary: 86 mg/dL (ref 70–99)
Glucose-Capillary: 149 mg/dL — ABNORMAL HIGH (ref 70–99)
Glucose-Capillary: 125 mg/dL — ABNORMAL HIGH (ref 70–99)
Glucose-Capillary: 185 mg/dL — ABNORMAL HIGH (ref 70–99)

## 2024-08-05 NOTE — Progress Notes (Addendum)
 PROGRESS NOTE    Katelyn [name]  FMW:[id number] DOB: Apr 29, 1956 DOA: 08/03/2024 PCP: Ofilia Lamar CROME, MD    68/F chronically ill with diastolic CHF, restrictive lung disease, NASH/cirrhosis, hypertension, diabetes, CAD, PAD, nonsustained V. tach, migraines, depression, RLS, thoracic and lumbar spine disease history of spinal cord stimulator, fusion, numerous surgeries, history of discitis with hardware removal presented to the ED with worsening dyspnea and orthopnea. - Recently admitted 9/25-9/30 with right shoulder abscess, in the background of known discitis sp hardware removal on long-term doxycycline  and ciprofloxacin .  She was treated with IV antibiotics in the hospital in consultation with infectious disease and transitioned back to doxycycline  and ciprofloxacin  on discharge.  Did undergo some diuresis due to volume overload while admitted. -Now with dyspnea, in the ED was in respiratory distress, hypertensive, placed on BiPAP, lab work noted hemoglobin 9.9, creatinine 1.0, BNP 193, troponin 30, Chest x-ray showed changes consistent with CHF including cardiomegaly, pulmonary edema, small effusions.  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  Subjective: Feels a little better, breathing starting to improve, could not use CPAP last night, still has significant swelling  Assessment and Plan:  Acute on chronic respiratory failure with hypoxia Acute on chronic diastolic CHF -Presenting with dyspnea on exertion, and respiratory distress in the ED, placed on BiPAP -Last echo was 9/25 noted EF 60-65%, indeterminate diastolic function, normal RV function. - Weaned off BiPAP, continue Lasix  60 mg twice daily, Aldactone, Coreg  - Poor candidate for SGLT2i - Increase activity, PT OT eval  Hypomagnesemia Repleted and recheck in a.m.   History of discitis status post hardware removal Recent shoulder abscess - Continue home ciprofloxacin  and doxycycline  -Significant debility,   follow-up with orthopedics - Has essentially become bedbound since December   Hypertension - Diuresis and Coreg  as above   Hyperlipidemia CAD PAD - Not on antilipid medication - Continue home Coreg    Diabetes > 40 units qhs at home and mealtime insulin  - Increase glargine and meal coverage   Neuropathy - Continue home Lyrica    NASH cirrhosis -Diuretics as noted above, albumin  is 2.5   Depression Anxiety - Continue home duloxetine  and Remeron    RLS - Continue home pramipexole    Restrictive lung disease Obesity OSA on CPAP - Continue CPAP   T-spine and L-spine disease Chronic pain -History of fusion , sp hardware removal due to infection as above - Continue home oxycodone    Goals: 68/F with chronic diastolic CHF, NASH/cirrhosis, numerous back surgeries, discitis/osteomyelitis, shoulder abscess, restrictive lung disease, with ongoing failure to thrive, requested palliative consult for goals of care   DVT prophylaxis:      Lovenox  Code Status:              DNR/DNI  Family Communication:   Spouse    updated at bedside yesterday Disposition Plan: Home pending improvement in volume status Consultants:    Procedures:   Antimicrobials:    Objective: Vitals:   08/05/24 0400 08/05/24 0505 08/05/24 0735 08/05/24 0900  BP:  (!) 143/67 93/63 (!) 149/56  Pulse: 73 89 89   Resp: 15 15 20 16   Temp:  98.1 F (36.7 C) 97.8 F (36.6 C)   TempSrc:  Oral Oral Oral  SpO2: 94% 94% 98% 96%  Weight:  101 kg    Height:        Intake/Output Summary (Last 24 hours) at 08/05/2024 1133 Last data filed at 08/05/2024 1040 Gross per 24 hour  Intake 480 ml  Output 1975 ml  Net -1495 ml   Filed Weights   08/04/24 1428 08/05/24 0505  Weight: 100.7 kg 101 kg    Examination:  General exam: Morbidly obese, chronically ill, AO x 3 HEENT: Positive JVD Lungs: Decreased breath sounds at the bases otherwise clear Abdomen: Soft, nontender, bowel sounds present Extremities: 2+  edema Skin: No rashes Psychiatry:  Mood & affect appropriate.     Data Reviewed:   CBC: Recent Labs  Lab 08/03/24 1251 08/03/24 1308 08/04/24 0251 08/05/24 0218  WBC 6.2  --  4.7 5.3  NEUTROABS 4.9  --   --   --   HGB 9.9* 10.2*  9.9* 8.4* 7.9*  HCT 31.2* 30.0*  29.0* 26.2* 24.9*  MCV 92.0  --  92.3 92.2  PLT 144*  --  134* 112*   Basic Metabolic Panel: Recent Labs  Lab 08/03/24 1251 08/03/24 1308 08/04/24 0251 08/05/24 0218  NA 138 141  141 136 136  K 4.3 4.3  4.3 4.5 3.6  CL 105 106 104 102  CO2 23  --  25 26  GLUCOSE 166* 167* 285* 140*  BUN 21 25* 26* 35*  CREATININE 0.93 1.00 1.12* 1.25*  CALCIUM  8.8*  --  8.1* 8.1*  MG 1.5*  --   --   --    GFR: Estimated Creatinine Clearance: 47.6 mL/min (A) (by C-G formula based on SCr of 1.25 mg/dL (H)). Liver Function Tests: Recent Labs  Lab 08/03/24 1251 08/04/24 0251  AST 55* 46*  ALT 32 25  ALKPHOS 206* 162*  BILITOT 0.7 0.6  PROT 6.6 5.7*  ALBUMIN  2.8* 2.5*   No results for input(s): LIPASE, AMYLASE in the last 168 hours. No results for input(s): AMMONIA in the last 168 hours. Coagulation Profile: No results for input(s): INR, PROTIME in the last 168 hours. Cardiac Enzymes: No results for input(s): CKTOTAL, CKMB, CKMBINDEX, TROPONINI in the last 168 hours. BNP (last 3 results) No results for input(s): PROBNP in the last 8760 hours. HbA1C: No results for input(s): HGBA1C in the last 72 hours. CBG: Recent Labs  Lab 08/04/24 0748 08/04/24 1311 08/04/24 1706 08/04/24 2112 08/05/24 0636  GLUCAP 210* 149* 172* 193* 86   Lipid Profile: No results for input(s): CHOL, HDL, LDLCALC, TRIG, CHOLHDL, LDLDIRECT in the last 72 hours. Thyroid  Function Tests: No results for input(s): TSH, T4TOTAL, FREET4, T3FREE, THYROIDAB in the last 72 hours. Anemia Panel: No results for input(s): VITAMINB12, FOLATE, FERRITIN, TIBC, IRON , RETICCTPCT in the last 72  hours. Urine analysis:    Component Value Date/Time   COLORURINE YELLOW 08/03/2024 1200   APPEARANCEUR CLEAR 08/03/2024 1200   LABSPEC 1.011 08/03/2024 1200   PHURINE 5.0 08/03/2024 1200   GLUCOSEU NEGATIVE 08/03/2024 1200   HGBUR NEGATIVE 08/03/2024 1200   BILIRUBINUR NEGATIVE 08/03/2024 1200   BILIRUBINUR negative 07/02/2024 1616   KETONESUR NEGATIVE 08/03/2024 1200   PROTEINUR NEGATIVE 08/03/2024 1200   UROBILINOGEN 0.2 07/02/2024 1616   UROBILINOGEN 0.2 01/31/2012 1504   NITRITE NEGATIVE 08/03/2024 1200   LEUKOCYTESUR NEGATIVE 08/03/2024 1200   Sepsis Labs: @LABRCNTIP (procalcitonin:4,lacticidven:4)  ) Recent Results (from the past 240 hours)  Resp panel by RT-PCR (RSV, Flu A&B, Covid) Anterior Nasal Swab     Status: None   Collection Time: 08/03/24 12:00 PM   Specimen: Anterior Nasal Swab  Result Value Ref Range Status   SARS Coronavirus 2 by RT PCR NEGATIVE NEGATIVE Final   Influenza A by PCR NEGATIVE NEGATIVE Final   Influenza B by PCR NEGATIVE NEGATIVE Final  Comment: (NOTE) The Xpert Xpress SARS-CoV-2/FLU/RSV plus assay is intended as an aid in the diagnosis of influenza from Nasopharyngeal swab specimens and should not be used as a sole basis for treatment. Nasal washings and aspirates are unacceptable for Xpert Xpress SARS-CoV-2/FLU/RSV testing.  Fact Sheet for Patients: bloggercourse.com  Fact Sheet for Healthcare Providers: seriousbroker.it  This test is not yet approved or cleared by the [place]  FDA and has been authorized for detection and/or diagnosis of SARS-CoV-2 by FDA under an Emergency Use Authorization (EUA). This EUA will remain in effect (meaning this test can be used) for the duration of the COVID-19 declaration under Section 564(b)(1) of the Act, 21 U.S.C. section 360bbb-3(b)(1), unless the authorization is terminated or revoked.     Resp Syncytial Virus by PCR NEGATIVE NEGATIVE  Final    Comment: (NOTE) Fact Sheet for Patients: bloggercourse.com  Fact Sheet for Healthcare Providers: seriousbroker.it  This test is not yet approved or cleared by the [place]  FDA and has been authorized for detection and/or diagnosis of SARS-CoV-2 by FDA under an Emergency Use Authorization (EUA). This EUA will remain in effect (meaning this test can be used) for the duration of the COVID-19 declaration under Section 564(b)(1) of the Act, 21 U.S.C. section 360bbb-3(b)(1), unless the authorization is terminated or revoked.  Performed at [facility], [address], [place], [place] [address]      Radiology Studies: VAS US  LOWER EXTREMITY VENOUS (DVT) (ONLY [facility] & [facility]) Result Date: 08/04/2024  Lower Venous DVT Study Patient Name:  Katelyn [name]  Date of Exam:   08/03/2024 Medical Rec #: [id number]    Accession #:    [id number] Date of Birth: 08/15/1956    Patient Gender: F Patient Age:   80 years Exam Location:  [facility] Procedure:      VAS US  LOWER EXTREMITY VENOUS (DVT) Referring Phys: MAUDE MESSICK --------------------------------------------------------------------------------  Indications: Edema, SOB, and chest pain.  Limitations: Body habitus and poor ultrasound/tissue interface. Comparison Study: Previous study on 9.26.2025. Performing Technologist: Edilia Elden Appl  Examination Guidelines: A complete evaluation includes B-mode imaging, spectral Doppler, color Doppler, and power Doppler as needed of all accessible portions of each vessel. Bilateral testing is considered an integral part of a complete examination. Limited examinations for reoccurring indications may be performed as noted. The reflux portion of the exam is performed with the patient in reverse Trendelenburg.  +---------+---------------+---------+-----------+----------+-------------------+ RIGHT     CompressibilityPhasicitySpontaneityPropertiesThrombus Aging      +---------+---------------+---------+-----------+----------+-------------------+ CFV      Full           Yes      Yes                                      +---------+---------------+---------+-----------+----------+-------------------+ SFJ      Full           Yes      Yes                                      +---------+---------------+---------+-----------+----------+-------------------+ FV Prox  Full                                                             +---------+---------------+---------+-----------+----------+-------------------+  FV Distal                                             Patent by color and                                                       doppler.            +---------+---------------+---------+-----------+----------+-------------------+ PFV                     Yes      Yes                  Patent by color and                                                       doppler.            +---------+---------------+---------+-----------+----------+-------------------+ POP      Full           Yes      Yes                                      +---------+---------------+---------+-----------+----------+-------------------+ PTV                                                   Not well                                                                  visualized. Patent                                                        by color.           +---------+---------------+---------+-----------+----------+-------------------+ PERO                                                  Not well visualized +---------+---------------+---------+-----------+----------+-------------------+ Right profunda vein and distal femoral vein were not well visualized, patent by color and doppler. Right peroneal veins were not visualized.   +---------+---------------+---------+-----------+----------+-------------------+ LEFT     CompressibilityPhasicitySpontaneityPropertiesThrombus Aging      +---------+---------------+---------+-----------+----------+-------------------+ CFV      Full           Yes      Yes                                      +---------+---------------+---------+-----------+----------+-------------------+  SFJ      Full           Yes      Yes                                      +---------+---------------+---------+-----------+----------+-------------------+ FV Prox  Full                                                             +---------+---------------+---------+-----------+----------+-------------------+ FV Mid   Full                                                             +---------+---------------+---------+-----------+----------+-------------------+ FV DistalFull           Yes      Yes                                      +---------+---------------+---------+-----------+----------+-------------------+ PFV      Full                                                             +---------+---------------+---------+-----------+----------+-------------------+ POP      Full           Yes      Yes                                      +---------+---------------+---------+-----------+----------+-------------------+ PTV                                                   Not well                                                                  visualized. Patent                                                        by color.           +---------+---------------+---------+-----------+----------+-------------------+ PERO  Not well visualized +---------+---------------+---------+-----------+----------+-------------------+ Anechoic structure noted in the left popliteal fossa measuring 3.9 x 1 x 2.4 cm. Left  peroneal veins were not visualized.    Summary: RIGHT: - There is no evidence of deep vein thrombosis in the lower extremity. However, portions of this examination were limited- see technologist comments above.  - No cystic structure found in the popliteal fossa.  LEFT: - There is no evidence of deep vein thrombosis in the lower extremity. However, portions of this examination were limited- see technologist comments above.  - A cystic structure is found in the popliteal fossa.  *See table(s) above for measurements and observations. Electronically signed by Gaile New MD on 08/04/2024 at 10:23:45 AM.    Final    DG Chest Port 1 View Result Date: 08/03/2024 CLINICAL DATA:  Shortness of breath. EXAM: PORTABLE CHEST 1 VIEW COMPARISON:  Radiograph and CT 07/04/2024 FINDINGS: Low lung volumes. The heart is mildly enlarged. Diffuse pulmonary edema is new from prior exam. Small pleural effusions. No confluent opacity or pneumothorax. Spinal stimulator and thoracic spine hardware. Anterior dislocation of the humerus with respect to the glenoid. Suggestion of progressive erosive changes from shoulder radiograph 07/05/2024 IMPRESSION: 1. Congestive heart failure with cardiomegaly, pulmonary edema and small pleural effusions. 2. 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 The possibility of septic arthritis is again raised. Electronically Signed   By: Andrea Gasman M.D.   On: 08/03/2024 12:30     Scheduled Meds:  carvedilol   6.25 mg Oral BID WC   ciprofloxacin   500 mg Oral BID   doxycycline   100 mg Oral BID   DULoxetine   60 mg Oral BID   enoxaparin  (LOVENOX ) injection  40 mg Subcutaneous Q24H   furosemide   60 mg Intravenous BID   insulin  aspart  0-15 Units Subcutaneous TID WC   insulin  glargine-yfgn  30 Units Subcutaneous QHS   mirtazapine   30 mg Oral QHS   pantoprazole   40 mg Oral Daily   pramipexole   1 mg  Oral QHS   pregabalin   75 mg Oral TID   sodium chloride  flush  3 mL Intravenous Q12H   spironolactone  25 mg Oral Daily   Continuous Infusions:   LOS: 2 days    Time spent:    Sigurd Pac, MD [facility]   08/05/2024, 11:33 AM

## 2024-08-05 NOTE — Consult Note (Signed)
 Consultation Note Date: 08/05/2024   Patient Name: Katelyn [name]  DOB: May 04, 1956  MRN: [id number]  Age / Sex: 68 y.o., female  PCP: Ofilia Lamar CROME, MD Referring Physician: Fairy Frames, MD  Reason for Consultation: Establishing goals of care  HPI/Patient Profile: 68 y.o. 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 Palliative consulted for goals of care in the setting of HFpEF, NASH cirrhosis, discitis osteomyelitis, shoulder abscess, restrictive lung disease and failure to thrive.   Clinical Assessment and Goals of Care: Consult received and chart review completed. Discussed with chaplain who has visited with Ms. Feild. I met today at Ms. Hight's bedside along with her husband. Ms. Adinolfi has good understanding of her complicated health situation. She shares that her mother had very similar issues and died in her early 51s - she sees herself going down a similar path as her mother. Ms. Dye has had multiple complex health issues and now that she has chronic pain and debility from right shoulder injury she is questioning her quality of life and wishes for the future.   We discussed goals of care. Ms. Gebert is very clear in her desire to optimize her health and fluid status while hospitalized. She wishes to return home and does not wish to return to the hospital. She would like to remain at home with her husband for her remaining days. We discussed use of  hospice at home to help with these goals and support in care at home. We discussed hospice care and how this works at home. We discussed care and follow up with nurses to help optimize her quality of life while providing care at home. We discussed use of medications but also use of comfort medications (even pain medications) for symptom management when diuretics not working as well anymore. We discussed how hospice has a lot of tricks and ways to help care for people so they do not suffer as health worsens. Ms. Mcquaid agrees that her goals and wishes aligns with hospice support at home. She lives near [place] and is familiar with the hospice facility there and we discussed about how they can help if needed. Husband is supportive of Ms. Rochford's wishes and hospice at home. He shares that he would be okay if she died in the home if this is what she wants.   Ms. Bialas talks of minimizing her suffering. She worries about caregiver burden for her husband as she has been caregiver for multiple of her family members. We discussed how hospice can help with caregiver support as well. Husband will need further guidance and support from hospice staff to help with plan and how to care for Ms. Haughn.  They agree to connect with hospice in preparations for home once medically optimized.   All questions/concerns addressed. Emotional support provided.   Primary Decision Maker PATIENT    SUMMARY OF RECOMMENDATIONS   - DNR - Continue optimize fluid status prior to return home - Hospice support at home ([facility])  Code Status/Advance Care Planning: DNR   Symptom Management:  Agree with current regimen. No changes.   Prognosis:  Overall prognosis poor.   Discharge Planning: Home with Hospice      Primary Diagnoses: Present on Admission:  Type 2 diabetes mellitus with diabetic neuropathy, unspecified (HCC)  Thoracic spondylosis with myelopathy  Restless leg  Restless leg syndrome   Recurrent major depression in remission  Peripheral artery disease  OSA (obstructive sleep apnea)  Obesity (BMI 30-39.9)  (Resolved) Mild CAD  Migraine without aura and responsive to treatment  Iron  deficiency anemia due to chronic blood loss  Hyperlipidemia  Hypertensive heart disease with congestive heart failure and chronic kidney disease (HCC)  Hepatic cirrhosis (HCC)  GAD (generalized anxiety disorder)  GERD (gastroesophageal reflux disease)  Essential hypertension  Dyslipidemia  CKD stage 3b, GFR 30-44 ml/min (HCC)  Chronic pain syndrome  Chronic diastolic CHF (congestive heart failure) (HCC)  Anemia of chronic disease  CAD (coronary artery disease)  Chronic hypoxemic respiratory failure (HCC)  Acute on chronic respiratory failure with hypoxia (HCC)  Acute heart failure with preserved ejection fraction (HFpEF) (HCC)   I have reviewed the medical record, interviewed the patient and family, and examined the patient. The following aspects are pertinent.  Past Medical History:  Diagnosis Date   ACS (acute coronary syndrome) (HCC) 04/06/2023   AKI (acute kidney injury) 02/20/2023   Anxiety    Arthritis    Bursitis of right hip    CAD (coronary artery disease)    Cardiac catheterization June 2014 in [place] - 50% circumflex stenosis   Chest pain, neg MI, stable CAD non obstructive on cath 10/05/20 10/04/2020   Chronic diastolic heart failure (HCC) 08/20/2017   Chronic kidney disease, stage 3 (HCC)    does not see nephrologist   Chronic low back pain without sciatica 03/14/2016   Cirrhosis of liver (HCC)    CKD (chronic kidney disease), stage III (HCC) 10/07/2020   Complication of anesthesia    Cough 04/19/2017   Overview:  Last Assessment & Plan:  Formatting of this note may be different from the original. Cough - ? ACE related with AR triggers   Plan  Patient Instructions  Discuss with your primary doctor that lisinopril  pain, need making your cough worse. May use  Mucinex  DM twice daily as needed for cough and congestion Zyrtec 10 mg at bedtime as needed for drainage Saline nasal spray as needed. Lab tests today Activity as tolerated. Follow with Dr. Shellia in 3-4 months and As needed   Please contact office for sooner follow up if symptoms do not improve or worsen or seek emergency care    COVID-19 virus infection 06/21/2021   Formatting of this note might be different from the original.  06/21/2021: paxlovid     Depression    Dyspnea    with exertion    lazy lung - per  Dr Shellia from back issues- 06/2016   Elevated liver enzymes 12/05/2016   Essential hypertension    GERD (gastroesophageal reflux disease)    Gout 03/14/2016   Greater trochanteric bursitis of right hip 02/02/2012   H/O hiatal hernia  Heart murmur    History of blood transfusion 2016   History of esophageal stricture 10/07/2020   History of kidney stones    Hypercholesterolemia    Hypertensive heart disease with heart failure (HCC) 01/01/2017   Hypoxia 10/07/2020   Iliotibial band syndrome of right side 02/02/2012   Iron  deficiency anemia due to chronic blood loss 03/14/2016   LBBB (left bundle branch block) 01/01/2017   Left bundle branch block    Leg weakness 10/07/2020   Lumbar stenosis    Meralgia paraesthetica 12/05/2016   Mild CAD 11/24/2015   Morbid obesity (HCC) 10/07/2020   Morbid obesity with BMI of 40.0-44.9, adult (HCC)    Neuropathy    OSA (obstructive sleep apnea) 05/24/2016   Overview:  Managed PULM- no CPAP   PONV (postoperative nausea and vomiting)    no N/V with patch   Restless leg syndrome    S/P lumbar laminectomy 11/26/2015   S/P lumbar spinal fusion 08/29/2016   Serratia 02/13/2023   SIRS (systemic inflammatory response syndrome) (HCC) 04/06/2023   Tinnitus 12/05/2016   Type 2 diabetes mellitus (HCC)    UTI (urinary tract infection) 10/07/2020   Social History   Socioeconomic History   Marital status: Married    Spouse name: Not on file    Number of children: 0   Years of education: Not on file   Highest education level: Not on file  Occupational History   Occupation: Librarian, Academic  Tobacco Use   Smoking status: Never   Smokeless tobacco: Never   Tobacco comments:    Prior secondhand smoke  Vaping Use   Vaping status: Never Used  Substance and Sexual Activity   Alcohol use: Not Currently   Drug use: No   Sexual activity: Not on file  Other Topics Concern   Not on file  Social History Narrative   1 son passed in 1998   Social Drivers of Health   Financial Resource Strain: Not on file  Food Insecurity: No Food Insecurity (08/04/2024)   Hunger Vital Sign    Worried About Running Out of Food in the Last Year: Never true    Ran Out of Food in the Last Year: Never true  Transportation Needs: No Transportation Needs (08/04/2024)   PRAPARE - Administrator, Civil Service (Medical): No    Lack of Transportation (Non-Medical): No  Physical Activity: Not on file  Stress: Not on file  Social Connections: Moderately Isolated (08/04/2024)   Social Connection and Isolation Panel    Frequency of Communication with Friends and Family: More than three times a week    Frequency of Social Gatherings with Friends and Family: More than three times a week    Attends Religious Services: Never    Database Administrator or Organizations: No    Attends Engineer, Structural: Never    Marital Status: Married   Family History  Problem Relation Age of Onset   Lung cancer Father        smoked   Hypertension Father    Stroke Father    Heart failure Mother    Bone cancer Sister    Asthma Sister    Scheduled Meds:  carvedilol   6.25 mg Oral BID WC   ciprofloxacin   500 mg Oral BID   doxycycline   100 mg Oral BID   DULoxetine   60 mg Oral BID   enoxaparin  (LOVENOX ) injection  40 mg Subcutaneous Q24H   furosemide   60 mg Intravenous  BID   insulin  aspart  0-15 Units Subcutaneous TID WC   insulin   glargine-yfgn  30 Units Subcutaneous QHS   mirtazapine   30 mg Oral QHS   pantoprazole   40 mg Oral Daily   pramipexole   1 mg Oral QHS   pregabalin   75 mg Oral TID   sodium chloride  flush  3 mL Intravenous Q12H   spironolactone  25 mg Oral Daily   Continuous Infusions: PRN Meds:.acetaminophen  **OR** acetaminophen , ipratropium-albuterol , melatonin, oxyCODONE , polyethylene glycol Allergies  Allergen Reactions   Atorvastatin Calcium      Other Reaction(s): Not available  atorvastatin calcium    Oxycodone  Other (See Comments)    Confusion, staring episodes  States she is not allergic.    Atorvastatin Nausea And Vomiting and Other (See Comments)    MYALGIAS   Pentazocine Other (See Comments)    headache  Other Reaction(s): Not available  pentazocine   Cefepime  Other (See Comments)    9/29 - had confusion again with retrial of cefepime   Presumed AMS/neurotoxicity in the setting of AKI  cefepime    Gabapentin  Swelling   Other Nausea Only    UNSPECIFIED Anesthesia   Review of Systems  Constitutional:  Positive for activity change, appetite change and fatigue.  Cardiovascular:  Positive for leg swelling.  Neurological:  Positive for weakness.    Physical Exam Vitals and nursing note reviewed.  Constitutional:      General: She is not in acute distress.    Appearance: She is ill-appearing.  Cardiovascular:     Rate and Rhythm: Normal rate.  Pulmonary:     Effort: No tachypnea, accessory muscle usage or respiratory distress.  Musculoskeletal:     Right lower leg: Edema present.     Left lower leg: Edema present.  Neurological:     Mental Status: She is alert.     Vital Signs: BP 93/63 (BP Location: Left Arm)   Pulse 89   Temp 97.8 F (36.6 C) (Oral)   Resp 20   Ht 5' 1 (1.549 m)   Wt 101 kg   SpO2 98%   BMI 42.07 kg/m  Pain Scale: 0-10 POSS *See Group Information*: 1-Acceptable,Awake and alert Pain Score: 0-No pain   SpO2: SpO2: 98 % O2 Device:SpO2: 98  % O2 Flow Rate: .O2 Flow Rate (L/min): 4 L/min  IO: Intake/output summary:  Intake/Output Summary (Last 24 hours) at 08/05/2024 0852 Last data filed at 08/05/2024 9160 Gross per 24 hour  Intake 480 ml  Output 1325 ml  Net -845 ml    LBM: Last BM Date : 08/04/24 Baseline Weight: Weight: 100.7 kg Most recent weight: Weight: 101 kg     Palliative Assessment/Data:    Time Total: 80 min  Greater than 50%  of this time was spent counseling and coordinating care related to the above assessment and plan.  Signed by: Bernarda Kitty, NP Palliative Medicine Team Pager # [phone] (M-F 8a-5p) Team Phone # [phone] (Nights/Weekends)

## 2024-08-05 NOTE — Evaluation (Signed)
 Physical Therapy Evaluation Patient Details Name: Katelyn [name] MRN: [id number] DOB: 1956-08-04 Today's Date: 08/05/2024  History of Present Illness  68 y.o. F adm 08/03/24 with increased SOB, HTN, acute on chronic respiratory failure and CHF. PMHx: Admission 9/25-9/30 for Rt shoulder abscess. AVN and advanced glenohumeral arthritis (awaiting sx), multiple lumbar surgeries, anxiety, chronic low back pain, DM2, obesity, OSA, LBBB, HTN, HLD, CKD, CAD.  Clinical Impression  Pt pleasant and familiar from prior admission. Pt states she is now nonoperatively managed for RUE, is not wearing sling and has been relying on power chair she has borrowed from a friend. Pt with decreased memory, strength and function who will benefit from acute therapy to maximize mobility, safety and function to decrease burden of care. Pt educated for progressive gait with hemiwalker and will continue to trial. Encouraged OOB to chair with staff daily.         If plan is discharge home, recommend the following: A little help with walking and/or transfers;A lot of help with bathing/dressing/bathroom;Assist for transportation   Can travel by private vehicle        Equipment Recommendations Other (comment) psychologist, educational)  Recommendations for Other Services       Functional Status Assessment Patient has had a recent decline in their functional status and/or demonstrates limited ability to make significant improvements in function in a reasonable and predictable amount of time     Precautions / Restrictions Precautions Precautions: Fall Recall of Precautions/Restrictions: Intact Restrictions Weight Bearing Restrictions Per Provider Order: No RUE Weight Bearing Per Provider Order: Non weight bearing Other Position/Activity Restrictions: NWB , humerus dislocation      Mobility  Bed Mobility               General bed mobility comments: sitting EOB on arrival    Transfers Overall transfer level: Needs  assistance   Transfers: Sit to/from Stand, Bed to chair/wheelchair/BSC Sit to Stand: Min assist   Step pivot transfers: Contact guard assist       General transfer comment: min assist to rise from bed and BSC with cues for hand placement, assist for power up. Pt able to step pivot from bed>BSC>recliner with hemiwalker and CGA    Ambulation/Gait               General Gait Details: pt declined attempting further gait  Stairs            Wheelchair Mobility     Tilt Bed    Modified Rankin (Stroke Patients Only)       Balance Overall balance assessment: Needs assistance   Sitting balance-Leahy Scale: Fair     Standing balance support: Single extremity supported, Reliant on assistive device for balance, During functional activity Standing balance-Leahy Scale: Poor Standing balance comment: hemiwalker in standing                             Pertinent Vitals/Pain Pain Assessment Pain Assessment: No/denies pain    Home Living Family/patient expects to be discharged to:: Private residence Living Arrangements: Spouse/significant other Available Help at Discharge: Family;Available 24 hours/day Type of Home: House Home Access: Ramped entrance       Home Layout: One level Home Equipment: Agricultural Consultant (2 wheels);Rollator (4 wheels);Wheelchair - manual;Shower seat;Grab bars - tub/shower;BSC/3in1;Hand held shower head;Adaptive equipment;Other (comment) Additional Comments: is borrowing a power chair from a friend, states manual chair doesn't fit well through doorways    Prior Function Prior  Level of Function : Needs assist;History of Falls (last six months)       Physical Assist : Mobility (physical);ADLs (physical) Mobility (physical): Bed mobility;Transfers ADLs (physical): IADLs;Toileting Mobility Comments: transferring to [facility] mostly on her own, spouse assist at times, using power chair ADLs Comments: Husband assist with ADLs as needed and he  performs IADLs     Extremity/Trunk Assessment   Upper Extremity Assessment Upper Extremity Assessment: RUE deficits/detail RUE Deficits / Details: pt with limited movement of RUE stating she does not use sling is is being managed non-operatively, grossly 20 degrees of shoulder mobility    Lower Extremity Assessment Lower Extremity Assessment: Generalized weakness    Cervical / Trunk Assessment Cervical / Trunk Assessment: Kyphotic  Communication   Communication Communication: No apparent difficulties    Cognition Arousal: Alert Behavior During Therapy: WFL for tasks assessed/performed   PT - Cognitive impairments: Memory                       PT - Cognition Comments: repeating statements several times, decreased memory Following commands: Intact       Cueing Cueing Techniques: Verbal cues, Gestural cues     General Comments      Exercises     Assessment/Plan    PT Assessment Patient needs continued PT services  PT Problem List Decreased activity tolerance;Decreased balance;Decreased mobility;Decreased strength;Decreased safety awareness;Decreased knowledge of use of DME       PT Treatment Interventions DME instruction;Gait training;Functional mobility training;Therapeutic activities;Therapeutic exercise;Patient/family education    PT Goals (Current goals can be found in the Care Plan section)  Acute Rehab PT Goals Patient Stated Goal: return home PT Goal Formulation: With patient Time For Goal Achievement: 08/19/24 Potential to Achieve Goals: Fair    Frequency Min 1X/week     Co-evaluation               AM-PAC PT 6 Clicks Mobility  Outcome Measure Help needed turning from your back to your side while in a flat bed without using bedrails?: A Little Help needed moving from lying on your back to sitting on the side of a flat bed without using bedrails?: A Little Help needed moving to and from a bed to a chair (including a wheelchair)?: A  Little Help needed standing up from a chair using your arms (e.g., wheelchair or bedside chair)?: A Little Help needed to walk in hospital room?: A Lot Help needed climbing 3-5 steps with a railing? : Total 6 Click Score: 15    End of Session   Activity Tolerance: Patient tolerated treatment well Patient left: in chair;with call bell/phone within reach;with chair alarm set;with nursing/sitter in room Nurse Communication: Mobility status PT Visit Diagnosis: Other abnormalities of gait and mobility (R26.89);Muscle weakness (generalized) (M62.81)    Time: 9176-9161 PT Time Calculation (min) (ACUTE ONLY): 15 min   Charges:   PT Evaluation $PT Eval Moderate Complexity: 1 Mod   PT General Charges $$ ACUTE PT VISIT: 1 Visit         Lenoard SQUIBB, PT Acute Rehabilitation Services Office: [phone]   Guled Gahan B Vermelle Cammarata 08/05/2024, 10:01 AM

## 2024-08-06 ENCOUNTER — Encounter: Admitting: Physical Medicine and Rehabilitation

## 2024-08-06 DIAGNOSIS — J9621 Acute and chronic respiratory failure with hypoxia: Secondary | ICD-10-CM | POA: Diagnosis not present

## 2024-08-06 LAB — BASIC METABOLIC PANEL WITH GFR
Anion gap: 8 (ref 5–15)
BUN: 36 mg/dL — ABNORMAL HIGH (ref 8–23)
CO2: 27 mmol/L (ref 22–32)
Calcium: 8.3 mg/dL — ABNORMAL LOW (ref 8.9–10.3)
Chloride: 99 mmol/L (ref 98–111)
Creatinine, Ser: 1.27 mg/dL — ABNORMAL HIGH (ref 0.44–1.00)
GFR, Estimated: 46 mL/min — ABNORMAL LOW (ref >60)
Glucose, Bld: 112 mg/dL — ABNORMAL HIGH (ref 70–99)
Potassium: 3.8 mmol/L (ref 3.5–5.1)
Sodium: 134 mmol/L — ABNORMAL LOW (ref 135–145)

## 2024-08-06 LAB — GLUCOSE, CAPILLARY
Glucose-Capillary: 76 mg/dL (ref 70–99)
Glucose-Capillary: 128 mg/dL — ABNORMAL HIGH (ref 70–99)
Glucose-Capillary: 210 mg/dL — ABNORMAL HIGH (ref 70–99)

## 2024-08-06 NOTE — TOC Initial Note (Addendum)
 Transition of Care [facility]) - Initial/Assessment Note    Patient Details  Name: Katelyn [name] MRN: [id number] Date of Birth: 1956-04-07  Transition [facility]) CM/SW Contact:    Waddell Barnie Rama, RN Phone Number: 08/06/2024, 11:49 AM  Clinical Narrative:                 From home with spouse, has PCP and insurance on file, states has  [facility] services in place  with [name] , has elec w/chair at home.  States family member (spouse)  will transport them home at [employer] and family is support system, states gets medications from [place] in [place] on [address]   Pta self ambulatory.   Patient states she would like to keep [facility] with [facility] and not the HHPT,HHOT because of her shoulder pain.  Then she stated that she wants Home Hospice.  NCM informed her that insurance will not pay for Hospice and HH at the same time.  NCM informed her will check with palliative rep.  Bernarda states patient spoke of home hospice yesterday during the palliative meeting.  She will put in consult for home hospice.  If she is going home with home hospice , they will provide the DME needed for patient.    NCM informed patient that if she goes home with Palliative services she can still have the [facility] apt and then when it is time to switch to hospice they can switch her over, she states this is what she wants , Palliative services with [facility].  NCM made referral to Cheri with [facility] for outpatient palliative services for now. Patient does not have a preference of the DME Agency for the Hemi walker. NCM made referral to [employer] thru portal.   Expected Discharge Plan: Home w Home Health Services Barriers to Discharge: Continued Medical Work up   Patient Goals and CMS Choice Patient states their goals for this hospitalization and ongoing recovery are:: return home CMS Medicare.gov Compare Post Acute Care list provided to:: Patient Choice offered to / list presented to : Patient      Expected Discharge Plan and  Services In-house Referral: Hospice / Palliative Care Discharge Planning Services: CM Consult Post Acute Care Choice: Durable Medical Equipment (hemi walker) Living arrangements for the past 2 months: Single Family Home                 DME Arranged: Walker hemi                    Prior Living Arrangements/Services Living arrangements for the past 2 months: Single Family Home Lives with:: Spouse Patient language and need for interpreter reviewed:: Yes Do you feel safe going back to the place where you live?: Yes      Need for Family Participation in Patient Care: Yes (Comment) Care giver support system in place?: Yes (comment) Current home services: DME (borrowed elec w/chair) Criminal Activity/Legal Involvement Pertinent to Current Situation/Hospitalization: No - Comment as needed  Activities of Daily Living   ADL Screening (condition at time of admission) Independently performs ADLs?: No Does the patient have a NEW difficulty with bathing/dressing/toileting/self-feeding that is expected to last >3 days?: No Does the patient have a NEW difficulty with getting in/out of bed, walking, or climbing stairs that is expected to last >3 days?: No Does the patient have a NEW difficulty with communication that is expected to last >3 days?: No Is the patient deaf or have difficulty hearing?:  No Does the patient have difficulty seeing, even when wearing glasses/contacts?: No Does the patient have difficulty concentrating, remembering, or making decisions?: No  Permission Sought/Granted Permission sought to share information with : Case Manager Permission granted to share information with : Yes, Verbal Permission Granted     Permission granted to share info w AGENCY: HH, DME        Emotional Assessment Appearance:: Appears stated age Attitude/Demeanor/Rapport: Engaged Affect (typically observed): Appropriate Orientation: : Oriented to Self, Oriented to  Time, Oriented to Place,  Oriented to Situation   Psych Involvement: No (comment)  Admission diagnosis:  Acute on chronic respiratory failure with hypoxia (HCC) [J96.21] Dyspnea, unspecified type [R06.00] Acute heart failure with preserved ejection fraction (HFpEF) (HCC) [I50.31] Patient Active Problem List   Diagnosis Date Noted   Acute heart failure with preserved ejection fraction (HFpEF) (HCC) 08/04/2024   Acute on chronic respiratory failure with hypoxia (HCC) 08/03/2024   Acute on chronic diastolic CHF (congestive heart failure) (HCC) 08/03/2024   Lower extremity edema 07/04/2024   Aortic valve disorder 05/21/2024   Malnutrition of moderate degree (Gomez: 60% to less than 75% of standard weight) 05/21/2024   Right shoulder pain 04/10/2024   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 02/19/2024   Medication management 02/19/2024   Presence of retained hardware 02/19/2024   Neurogenic bladder 02/10/2024   Neuropathic pain 02/06/2024   Chronic pain syndrome 01/25/2024   Thoracic spondylosis with myelopathy 01/07/2024   Hepatic cirrhosis (HCC) 11/22/2023   Acute sciatica 04/06/2023   Dyslipidemia 04/06/2023   Hyponatremia 04/06/2023   Hypothermia 04/06/2023   Renal insufficiency 04/06/2023   Slurred speech 04/06/2023   Thrombocytopenia 04/06/2023   Ventricular tachycardia, nonsustained (HCC) 04/06/2023   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   Elevated LFTs 03/13/2023   Status post thoracic spinal fusion 02/19/2023   Discitis 02/13/2023   Malfunction of spinal cord stimulator 02/13/2023   Osteomyelitis (HCC) 02/05/2023   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   Infection of spinal cord stimulator 12/07/2022   Lactic acidosis 12/06/2022   Hypomagnesemia 12/06/2022   Anemia of chronic disease 12/06/2022   Diskitis 12/05/2022   Status post insertion of spinal cord stimulator 11/22/2022   Acute thoracic back pain 06/22/2022   Other long term (current) drug therapy 06/22/2022   Lumbar spondylosis 06/22/2022    Bilateral sacroiliitis 06/22/2022   Chronic, continuous use of opioids 05/31/2022   Pain medication agreement 05/02/2022   Postlaminectomy syndrome of lumbar region 05/02/2022   Radiculopathy of thoracolumbar region 05/02/2022   Obesity (BMI 30-39.9) 05/02/2022   Impaired mobility and ADLs 05/02/2022   Mastodynia 04/13/2022   Peripheral artery disease 07/11/2021   Rib pain on left side 06/06/2021   Abdominal pain 06/05/2021   Diabetes 1.5, 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   GAD (generalized anxiety disorder) 11/25/2020   DM2 (diabetes mellitus, type 2) (HCC)    Restless leg syndrome    PONV (postoperative nausea and vomiting)    Neuropathy    Lumbar stenosis    Left bundle branch block    Hypercholesterolemia    Heart murmur    History of kidney stones    H/O hiatal hernia    Essential hypertension    Dyspnea    Complication of anesthesia    CKD stage 3b, GFR 30-44 ml/min (HCC)    CAD (coronary artery disease)    Bursitis of right hip    Arthritis    Morbid obesity (HCC) 10/07/2020  Hypoxia 10/07/2020   History of esophageal stricture 10/07/2020   Leg weakness 10/07/2020   Chronic hypoxemic respiratory failure (HCC) 10/07/2020   Pulmonary hypertension (HCC) 10/06/2020   Chest pain, neg MI, stable CAD non obstructive on cath 10/05/20 10/04/2020   Hardening of the aorta (main artery of the heart) 11/25/2019   Hypertensive heart disease with congestive heart failure and chronic kidney disease (HCC) 11/25/2019   Fatty liver 04/08/2019   History of chronic kidney disease 04/08/2019   Migraine without aura and responsive to treatment 04/08/2019   Chronic diastolic CHF (congestive heart failure) (HCC) 08/20/2017   Genetic testing 08/17/2017   Family history of ovarian cancer 08/17/2017   Family history of breast cancer 08/17/2017   Cough 04/19/2017   DOE (dyspnea on exertion) 04/19/2017   Type 2 diabetes mellitus with diabetic  neuropathy, unspecified (HCC) 01/16/2017   Hyperlipidemia 01/01/2017   LBBB (left bundle branch block) 01/01/2017   Elevated liver enzymes 12/05/2016   Meralgia paraesthetica 12/05/2016   Tinnitus 12/05/2016   Spinal stenosis of lumbar region with neurogenic claudication 12/05/2016   Wound dehiscence 10/23/2016   S/P lumbar spinal fusion 08/29/2016   OSA (obstructive sleep apnea) 05/24/2016   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   Chronic low back pain without sciatica 03/14/2016   GERD (gastroesophageal reflux disease) 03/14/2016   Gout 03/14/2016   Iron  deficiency anemia due to chronic blood loss 03/14/2016   Recurrent major depression in remission 12/07/2015   Restless leg 12/07/2015   Insomnia 12/07/2015   S/P lumbar laminectomy 11/26/2015   Postprocedural state 11/26/2015   Lumbar radiculopathy 10/15/2015   History of blood transfusion 2016   Kidney stone 04/20/2014   Stricture of esophagus 04/20/2014   Greater trochanteric bursitis of right hip 02/02/2012   Iliotibial band syndrome of right side 02/02/2012   Iliotibial band syndrome 02/02/2012   Bursitis, trochanteric 02/02/2012   PCP:  Ofilia Lamar CROME, MD Pharmacy:   [facility] [address] [place] - [address] [place] [address] Phone: [phone] Fax: [phone]  [facility] [address] [place] [address] Phone: [phone] Fax: [phone]  [facility] [address], [address] [place] [address] Phone: [phone] Fax: [phone]     Social Drivers of Health (SDOH) Social History: SDOH Screenings   Food Insecurity: No Food Insecurity (08/04/2024)  Housing: Low Risk  (08/04/2024)  Transportation Needs: No Transportation Needs (08/04/2024)  Utilities: Not At Risk (08/04/2024)  Depression (PHQ2-9): Low Risk  (05/07/2024)  Social Connections: Moderately Isolated (08/04/2024)   Tobacco Use: Low Risk  (08/03/2024)   SDOH Interventions:     Readmission Risk Interventions    08/06/2024   11:32 AM  Readmission Risk Prevention Plan  Transportation Screening Complete  Medication Review (RN Care Manager) Complete  PCP or Specialist appointment within 3-5 days of discharge Complete  HRI or Home Care Consult Complete  Palliative Care Screening Not Applicable  Skilled Nursing Facility Not Applicable

## 2024-08-06 NOTE — Progress Notes (Signed)
 PROGRESS NOTE    Katelyn [name]  FMW:[id number] DOB: 01-26-1956 DOA: 08/03/2024 PCP: Ofilia Lamar CROME, MD    68/F chronically ill with diastolic CHF, restrictive lung disease, NASH/cirrhosis, hypertension, diabetes, CAD, PAD, nonsustained V. tach, migraines, depression, RLS, thoracic and lumbar spine disease history of spinal cord stimulator, fusion, numerous surgeries, history of discitis with hardware removal presented to the ED with worsening dyspnea and orthopnea. - Recently admitted 9/25-9/30 with right shoulder abscess, in the background of known discitis sp hardware removal on long-term doxycycline  and ciprofloxacin .  She was treated with IV antibiotics in the hospital in consultation with infectious disease and transitioned back to doxycycline  and ciprofloxacin  on discharge.  Did undergo some diuresis due to volume overload while admitted. -Now with dyspnea, in the ED was in respiratory distress, hypertensive, placed on BiPAP, lab work noted hemoglobin 9.9, creatinine 1.0, BNP 193, troponin 30, Chest x-ray showed changes consistent with CHF including cardiomegaly, pulmonary edema, small effusions.  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 - Slowly improving with diuretics - Palliative care consulted and following with significant disease burden  Subjective: Feels better overall, breathing continues to improve  Assessment and Plan:  Acute on chronic respiratory failure with hypoxia Acute on chronic diastolic CHF -Presenting with dyspnea on exertion, and respiratory distress in the ED, placed on BiPAP -Last echo was 9/25 noted EF 60-65%, indeterminate diastolic function, normal RV function. - Weaned off BiPAP, better diuresis yesterday, continue Lasix  60 mg twice daily, Aldactone and Coreg   - Poor candidate for SGLT2i - Increase activity, PT OT eval - Palliative following as well  Hypomagnesemia Repleted and recheck in a.m.   History of discitis status post  hardware removal Recent shoulder abscess - Continue home ciprofloxacin  and doxycycline  -Significant debility,  follow-up with orthopedics - Has essentially become bedbound since December   Hypertension - Diuresis and Coreg  as above   Hyperlipidemia CAD PAD - Not on antilipid medication - Continue home Coreg    Diabetes > 40 units qhs at home and mealtime insulin  - Labile, continue glargine and meal coverage   Neuropathy - Continue home Lyrica    NASH cirrhosis -Diuretics as noted above, albumin  is 2.5   Depression Anxiety - Continue home duloxetine  and Remeron    RLS - Continue home pramipexole    Restrictive lung disease Obesity OSA on CPAP - Continue CPAP   T-spine and L-spine disease Chronic pain -History of fusion , sp hardware removal due to infection as above - Continue home oxycodone    Goals: 68/F with chronic diastolic CHF, NASH/cirrhosis, numerous back surgeries, discitis/osteomyelitis, shoulder abscess, restrictive lung disease, with ongoing failure to thrive, requested palliative consult for goals of care> family meeting completed yesterday, she is considering getting hospice involved at discharge   DVT prophylaxis:      Lovenox  Code Status:              DNR/DNI  Family Communication:   No family at bedside, spouse updated earlier Disposition Plan: Home pending improvement in volume status Consultants:    Procedures:   Antimicrobials:    Objective: Vitals:   08/05/24 2314 08/06/24 0328 08/06/24 0409 08/06/24 0734  BP: (!) 111/46 (!) 143/60  (!) 143/90  Pulse: 92 92  84  Resp: 17 12  16   Temp: 98.1 F (36.7 C) 98.1 F (36.7 C)  98.1 F (36.7 C)  TempSrc: Oral Oral  Oral  SpO2: 97% 95%  95%  Weight:   97.2 kg   Height:  Intake/Output Summary (Last 24 hours) at 08/06/2024 1143 Last data filed at 08/06/2024 1026 Gross per 24 hour  Intake 600 ml  Output 4250 ml  Net -3650 ml   Filed Weights   08/04/24 1428 08/05/24 0505 08/06/24  0409  Weight: 100.7 kg 101 kg 97.2 kg    Examination:  General exam: Morbidly obese, chronically ill, AO x 3, no distress HEENT: Positive JVD Lungs: Decreased breath sounds at the bases otherwise clear Abdomen: Soft, nontender, bowel sounds present Extremities: 1-2+ edema Skin: No rashes Psychiatry:  Mood & affect appropriate.     Data Reviewed:   CBC: Recent Labs  Lab 08/03/24 1251 08/03/24 1308 08/04/24 0251 08/05/24 0218  WBC 6.2  --  4.7 5.3  NEUTROABS 4.9  --   --   --   HGB 9.9* 10.2*  9.9* 8.4* 7.9*  HCT 31.2* 30.0*  29.0* 26.2* 24.9*  MCV 92.0  --  92.3 92.2  PLT 144*  --  134* 112*   Basic Metabolic Panel: Recent Labs  Lab 08/03/24 1251 08/03/24 1308 08/04/24 0251 08/05/24 0218 08/06/24 0224  NA 138 141  141 136 136 134*  K 4.3 4.3  4.3 4.5 3.6 3.8  CL 105 106 104 102 99  CO2 23  --  25 26 27   GLUCOSE 166* 167* 285* 140* 112*  BUN 21 25* 26* 35* 36*  CREATININE 0.93 1.00 1.12* 1.25* 1.27*  CALCIUM  8.8*  --  8.1* 8.1* 8.3*  MG 1.5*  --   --   --   --    GFR: Estimated Creatinine Clearance: 45.9 mL/min (A) (by C-G formula based on SCr of 1.27 mg/dL (H)). Liver Function Tests: Recent Labs  Lab 08/03/24 1251 08/04/24 0251  AST 55* 46*  ALT 32 25  ALKPHOS 206* 162*  BILITOT 0.7 0.6  PROT 6.6 5.7*  ALBUMIN  2.8* 2.5*   No results for input(s): LIPASE, AMYLASE in the last 168 hours. No results for input(s): AMMONIA in the last 168 hours. Coagulation Profile: No results for input(s): INR, PROTIME in the last 168 hours. Cardiac Enzymes: No results for input(s): CKTOTAL, CKMB, CKMBINDEX, TROPONINI in the last 168 hours. BNP (last 3 results) No results for input(s): PROBNP in the last 8760 hours. HbA1C: No results for input(s): HGBA1C in the last 72 hours. CBG: Recent Labs  Lab 08/05/24 0636 08/05/24 1129 08/05/24 1711 08/05/24 2128 08/06/24 0605  GLUCAP 86 149* 125* 185* 76   Lipid Profile: No results for  input(s): CHOL, HDL, LDLCALC, TRIG, CHOLHDL, LDLDIRECT in the last 72 hours. Thyroid  Function Tests: No results for input(s): TSH, T4TOTAL, FREET4, T3FREE, THYROIDAB in the last 72 hours. Anemia Panel: No results for input(s): VITAMINB12, FOLATE, FERRITIN, TIBC, IRON , RETICCTPCT in the last 72 hours. Urine analysis:    Component Value Date/Time   COLORURINE YELLOW 08/03/2024 1200   APPEARANCEUR CLEAR 08/03/2024 1200   LABSPEC 1.011 08/03/2024 1200   PHURINE 5.0 08/03/2024 1200   GLUCOSEU NEGATIVE 08/03/2024 1200   HGBUR NEGATIVE 08/03/2024 1200   BILIRUBINUR NEGATIVE 08/03/2024 1200   BILIRUBINUR negative 07/02/2024 1616   KETONESUR NEGATIVE 08/03/2024 1200   PROTEINUR NEGATIVE 08/03/2024 1200   UROBILINOGEN 0.2 07/02/2024 1616   UROBILINOGEN 0.2 01/31/2012 1504   NITRITE NEGATIVE 08/03/2024 1200   LEUKOCYTESUR NEGATIVE 08/03/2024 1200   Sepsis Labs: @LABRCNTIP (procalcitonin:4,lacticidven:4)  ) Recent Results (from the past 240 hours)  Resp panel by RT-PCR (RSV, Flu A&B, Covid) Anterior Nasal Swab     Status: None  Collection Time: 08/03/24 12:00 PM   Specimen: Anterior Nasal Swab  Result Value Ref Range Status   SARS Coronavirus 2 by RT PCR NEGATIVE NEGATIVE Final   Influenza A by PCR NEGATIVE NEGATIVE Final   Influenza B by PCR NEGATIVE NEGATIVE Final    Comment: (NOTE) The Xpert Xpress SARS-CoV-2/FLU/RSV plus assay is intended as an aid in the diagnosis of influenza from Nasopharyngeal swab specimens and should not be used as a sole basis for treatment. Nasal washings and aspirates are unacceptable for Xpert Xpress SARS-CoV-2/FLU/RSV testing.  Fact Sheet for Patients: bloggercourse.com  Fact Sheet for Healthcare Providers: seriousbroker.it  This test is not yet approved or cleared by the [place]  FDA and has been authorized for detection and/or diagnosis of SARS-CoV-2 by FDA  under an Emergency Use Authorization (EUA). This EUA will remain in effect (meaning this test can be used) for the duration of the COVID-19 declaration under Section 564(b)(1) of the Act, 21 U.S.C. section 360bbb-3(b)(1), unless the authorization is terminated or revoked.     Resp Syncytial Virus by PCR NEGATIVE NEGATIVE Final    Comment: (NOTE) Fact Sheet for Patients: bloggercourse.com  Fact Sheet for Healthcare Providers: seriousbroker.it  This test is not yet approved or cleared by the [place]  FDA and has been authorized for detection and/or diagnosis of SARS-CoV-2 by FDA under an Emergency Use Authorization (EUA). This EUA will remain in effect (meaning this test can be used) for the duration of the COVID-19 declaration under Section 564(b)(1) of the Act, 21 U.S.C. section 360bbb-3(b)(1), unless the authorization is terminated or revoked.  Performed at [facility], [address] [place], [place] [address]      Radiology Studies: No results found.    Scheduled Meds:  carvedilol   6.25 mg Oral BID WC   ciprofloxacin   500 mg Oral BID   doxycycline   100 mg Oral BID   DULoxetine   60 mg Oral BID   enoxaparin  (LOVENOX ) injection  40 mg Subcutaneous Q24H   furosemide   60 mg Intravenous BID   insulin  aspart  0-15 Units Subcutaneous TID WC   insulin  glargine-yfgn  30 Units Subcutaneous QHS   mirtazapine   30 mg Oral QHS   pantoprazole   40 mg Oral Daily   pramipexole   1 mg Oral QHS   pregabalin   75 mg Oral TID   sodium chloride  flush  3 mL Intravenous Q12H   spironolactone  25 mg Oral Daily   Continuous Infusions:   LOS: 3 days    Time spent:    Sigurd Pac, MD [facility]   08/06/2024, 11:43 AM

## 2024-08-06 NOTE — Progress Notes (Signed)
Placed patient on Bipap for the night.  

## 2024-08-06 NOTE — Progress Notes (Signed)
 This chaplain is present for F/U spiritual care as requested by the Pt. Family is not at the bedside. The Pt. is sitting in the bedside recliner during the visit.  The chaplain listened reflectively as the Pt. tells the story of her matriarchal role. The chaplain understands part of the Pt. transition to Hospice care will be having the important conversations with her family. The Pt. is interested in chaplain support with Hospice.  This chaplain will continue to be a listening presence as the Pt. Prepares physically and emotionally for EOL.  Chaplain Leeroy Hummer [phone]

## 2024-08-06 NOTE — Plan of Care (Signed)
   Problem: Education: Goal: Knowledge of General Education information will improve Description Including pain rating scale, medication(s)/side effects and non-pharmacologic comfort measures Outcome: Progressing   Problem: Health Behavior/Discharge Planning: Goal: Ability to manage health-related needs will improve Outcome: Progressing

## 2024-08-06 NOTE — Progress Notes (Signed)
   08/06/24 0026  BiPAP/CPAP/SIPAP  BiPAP/CPAP/SIPAP Pt Type Adult  BiPAP/CPAP/SIPAP DREAMSTATIOND  Mask Type Full face mask  Mask Size Small  IPAP 12 cmH20  EPAP 6 cmH2O

## 2024-08-07 DIAGNOSIS — J9621 Acute and chronic respiratory failure with hypoxia: Secondary | ICD-10-CM | POA: Diagnosis not present

## 2024-08-07 LAB — BASIC METABOLIC PANEL WITH GFR
Anion gap: 10 (ref 5–15)
BUN: 38 mg/dL — ABNORMAL HIGH (ref 8–23)
CO2: 30 mmol/L (ref 22–32)
Calcium: 8.4 mg/dL — ABNORMAL LOW (ref 8.9–10.3)
Chloride: 96 mmol/L — ABNORMAL LOW (ref 98–111)
Creatinine, Ser: 1.68 mg/dL — ABNORMAL HIGH (ref 0.44–1.00)
GFR, Estimated: 33 mL/min — ABNORMAL LOW (ref >60)
Glucose, Bld: 151 mg/dL — ABNORMAL HIGH (ref 70–99)
Potassium: 4 mmol/L (ref 3.5–5.1)
Sodium: 136 mmol/L (ref 135–145)

## 2024-08-07 LAB — GLUCOSE, CAPILLARY
Glucose-Capillary: 110 mg/dL — ABNORMAL HIGH (ref 70–99)
Glucose-Capillary: 154 mg/dL — ABNORMAL HIGH (ref 70–99)
Glucose-Capillary: 177 mg/dL — ABNORMAL HIGH (ref 70–99)
Glucose-Capillary: 293 mg/dL — ABNORMAL HIGH (ref 70–99)

## 2024-08-07 LAB — MAGNESIUM: Magnesium: 1.5 mg/dL — ABNORMAL LOW (ref 1.7–2.4)

## 2024-08-07 MED ORDER — MAGNESIUM SULFATE 2 GM/50ML IV SOLN
2.0000 g | Freq: Once | INTRAVENOUS | Status: AC
  Administered 2024-08-07: 2 g via INTRAVENOUS
  Filled 2024-08-07: qty 50

## 2024-08-07 NOTE — Progress Notes (Signed)
 Mobility Specialist Progress Note:    08/07/24 1033  Mobility  Activity Ambulated with assistance  Level of Assistance Contact guard assist, steadying assist  Assistive Device Other (Comment) (Hemi Walker)  Distance Ambulated (ft) 15 ft  Range of Motion/Exercises Active  RUE Weight Bearing Per Provider Order NWB  Activity Response Tolerated fair  Mobility Referral Yes  Mobility visit 1 Mobility  Mobility Specialist Start Time (ACUTE ONLY) 1033  Mobility Specialist Stop Time (ACUTE ONLY) 1110  Mobility Specialist Time Calculation (min) (ACUTE ONLY) 37 min   Received pt transferring off BSC agreeable to session. No c/o any symptoms. Pt practicing ambulating using hemi walker. Pt moving decently getting use to it. Pt returned to recliner and performed exercises. Was able to do them decently. Left pt in recliner w/ all needs met and wound care performing an exam.  Venetia Keel Mobility Specialist Please Contact via SecureChat or Rehab Office at [phone]

## 2024-08-07 NOTE — Discharge Summary (Addendum)
 PROGRESS NOTE    Katelyn [name]  FMW:[id number] DOB: May 17, 1956 DOA: 08/03/2024 PCP: Ofilia Lamar CROME, MD   Brief Narrative:  This 68/F chronically ill with diastolic CHF, restrictive lung disease, NASH/cirrhosis, hypertension, diabetes, CAD, PAD, nonsustained V. tach, migraines, depression, RLS, thoracic and lumbar spine disease,  history of spinal cord stimulator, Spinal fusion, numerous surgeries, history of discitis with hardware removal presented to the ED with worsening dyspnea and orthopnea. - Recently admitted 9/25-9/30 with right shoulder abscess, in the background of known discitis sp hardware removal on long-term doxycycline  and ciprofloxacin .  She was treated with IV antibiotics in the hospital in consultation with infectious disease and transitioned back to doxycycline  and ciprofloxacin  on discharge.  Did undergo some diuresis due to volume overload while admitted. -Now with dyspnea, in the ED was in respiratory distress, hypertensive, placed on BiPAP, lab work noted hemoglobin 9.9, creatinine 1.0, BNP 193, troponin 30, Chest x-ray showed changes consistent with CHF including cardiomegaly, pulmonary edema, small effusions.  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 - Slowly improving with diuretics - Palliative care consulted and following with significant disease burden   Assessment & Plan:   Principal Problem:   Acute on chronic respiratory failure with hypoxia (HCC) Active Problems:   Recurrent major depression in remission   Restless leg   GERD (gastroesophageal reflux disease)   Hyperlipidemia   Iron  deficiency anemia due to chronic blood loss   OSA (obstructive sleep apnea)   Chronic diastolic CHF (congestive heart failure) (HCC)   DM2 (diabetes mellitus, type 2) (HCC)   Restless leg syndrome   Neuropathy   Essential hypertension   CKD stage 3b, GFR 30-44 ml/min (HCC)   CAD (coronary artery disease)   Chronic hypoxemic respiratory failure  (HCC)   Type 2 diabetes mellitus with diabetic neuropathy, unspecified (HCC)   Hypertensive heart disease with congestive heart failure and chronic kidney disease (HCC)   Peripheral artery disease   GAD (generalized anxiety disorder)   Obesity (BMI 30-39.9)   Anemia of chronic disease   Dyslipidemia   Status post insertion of spinal cord stimulator   Thoracic spondylosis with myelopathy   Chronic pain syndrome   Hepatic cirrhosis (HCC)   Migraine without aura and responsive to treatment   Acute on chronic diastolic CHF (congestive heart failure) (HCC)   Acute heart failure with preserved ejection fraction (HFpEF) (HCC)  Acute on chronic hypoxic respiratory failure: Acute on chronic diastolic CHF: She presented with dyspnea on exertion, and respiratory distress in the ED, placed on BiPAP Last echo was 9/25 noted EF 60-65%, indeterminate diastolic function, normal RV function. Weaned off BiPAP, better diuresis yesterday, Continue Lasix  60 mg twice daily, Aldactone and Coreg   Poor candidate for SGLT2i Increase activity, PT OT eval Palliative following as well.    Intake/Output Summary (Last 24 hours) at 08/07/2024 1229 Last data filed at 08/07/2024 1046 Gross per 24 hour  Intake 297 ml  Output 3500 ml  Net -3203 ml      Hypomagnesemia: Repleted.  Continue to monitor   History of discitis status post hardware removal: Recent shoulder abscess: Continue home ciprofloxacin  and doxycycline . Significant debility,  follow-up with orthopedics Has essentially become bedbound since December.   Hypertension: Continue Diuresis and Coreg  as above.   Hyperlipidemia: CAD PAD - Not on antilipid medication. - Continue home Coreg    Diabetes Mellitus II: PTA 40 units qhs at home and mealtime insulin  - Labile, continue glargine and meal coverage   Neuropathy: -  Continue home Lyrica    NASH cirrhosis: -Diuretics as noted above, albumin  is 2.5   Depression/ Anxiety: - Continue  home duloxetine  and Remeron .   RLS: - Continue home pramipexole    Restrictive lung disease Obesity OSA on CPAP - Continue CPAP,   T-spine and L-spine disease: Chronic pain -History of fusion , sp hardware removal due to infection as above. - Continue home oxycodone .   Goals: 68/F with chronic diastolic CHF, NASH/cirrhosis, numerous back surgeries, discitis/osteomyelitis, shoulder abscess, restrictive lung disease, with ongoing failure to thrive, requested palliative consult for goals of care> family meeting completed yesterday, she is considering getting hospice involved at discharge   DVT prophylaxis: Lovenox  Code Status:DNR/DNI Family Communication: No family at bed side Disposition Plan:    Status is: Inpatient Remains inpatient appropriate because: Severity of illness    Consultants:  Cardiology  Procedures: Echo  Antimicrobials:  Anti-infectives (From admission, onward)    Start     Dose/Rate Route Frequency Ordered Stop   08/03/24 2200  doxycycline  (VIBRA -TABS) tablet 100 mg        100 mg Oral 2 times daily 08/03/24 1528     08/03/24 2000  ciprofloxacin  (CIPRO ) tablet 500 mg        500 mg Oral 2 times daily 08/03/24 1528        Subjective: Patient was seen and examined at bedside.  Overnight events noted. Patient was sitting comfortably on the chair and states she feels better,  slightly bloated but improving.  Objective: Vitals:   08/07/24 0739 08/07/24 0740 08/07/24 0800 08/07/24 1133  BP:  (!) 145/60 (!) 142/115 111/82  Pulse: 70  87 85  Resp:  17  18  Temp: 98.3 F (36.8 C)  98.1 F (36.7 C) 98.7 F (37.1 C)  TempSrc: Oral  Oral Oral  SpO2: 94%  94% 98%  Weight:      Height:        Intake/Output Summary (Last 24 hours) at 08/07/2024 1229 Last data filed at 08/07/2024 1046 Gross per 24 hour  Intake 297 ml  Output 3500 ml  Net -3203 ml   Filed Weights   08/05/24 0505 08/06/24 0409 08/07/24 0401  Weight: 101 kg 97.2 kg 96.6 kg     Examination:  General exam: Appears calm and comfortable, not in any acute distress.  Deconditioned Respiratory system: Clear to auscultation. Respiratory effort normal.  RR 12 Cardiovascular system: S1 & S2 heard, RRR. No JVD, murmurs, rubs, gallops or clicks.  Gastrointestinal system: Abdomen is non distended, soft and non tender.Normal bowel sounds heard. Central nervous system: Alert and oriented x 3. No focal neurological deficits. Extremities: Edema+, no cyanosis, no clubbing Skin: No rashes, lesions or ulcers Psychiatry: Judgement and insight appear normal. Mood & affect appropriate.     Data Reviewed: I have personally reviewed following labs and imaging studies  CBC: Recent Labs  Lab 08/03/24 1251 08/03/24 1308 08/04/24 0251 08/05/24 0218  WBC 6.2  --  4.7 5.3  NEUTROABS 4.9  --   --   --   HGB 9.9* 10.2*  9.9* 8.4* 7.9*  HCT 31.2* 30.0*  29.0* 26.2* 24.9*  MCV 92.0  --  92.3 92.2  PLT 144*  --  134* 112*   Basic Metabolic Panel: Recent Labs  Lab 08/03/24 1251 08/03/24 1308 08/04/24 0251 08/05/24 0218 08/06/24 0224 08/07/24 0218  NA 138 141  141 136 136 134* 136  K 4.3 4.3  4.3 4.5 3.6 3.8 4.0  CL 105 106 104  102 99 96*  CO2 23  --  25 26 27 30   GLUCOSE 166* 167* 285* 140* 112* 151*  BUN 21 25* 26* 35* 36* 38*  CREATININE 0.93 1.00 1.12* 1.25* 1.27* 1.68*  CALCIUM  8.8*  --  8.1* 8.1* 8.3* 8.4*  MG 1.5*  --   --   --   --  1.5*   GFR: Estimated Creatinine Clearance: 34.5 mL/min (A) (by C-G formula based on SCr of 1.68 mg/dL (H)). Liver Function Tests: Recent Labs  Lab 08/03/24 1251 08/04/24 0251  AST 55* 46*  ALT 32 25  ALKPHOS 206* 162*  BILITOT 0.7 0.6  PROT 6.6 5.7*  ALBUMIN  2.8* 2.5*   No results for input(s): LIPASE, AMYLASE in the last 168 hours. No results for input(s): AMMONIA in the last 168 hours. Coagulation Profile: No results for input(s): INR, PROTIME in the last 168 hours. Cardiac Enzymes: No results for  input(s): CKTOTAL, CKMB, CKMBINDEX, TROPONINI in the last 168 hours. BNP (last 3 results) No results for input(s): PROBNP in the last 8760 hours. HbA1C: No results for input(s): HGBA1C in the last 72 hours. CBG: Recent Labs  Lab 08/06/24 0605 08/06/24 1152 08/06/24 2108 08/07/24 0612 08/07/24 1136  GLUCAP 76 128* 210* 110* 154*   Lipid Profile: No results for input(s): CHOL, HDL, LDLCALC, TRIG, CHOLHDL, LDLDIRECT in the last 72 hours. Thyroid  Function Tests: No results for input(s): TSH, T4TOTAL, FREET4, T3FREE, THYROIDAB in the last 72 hours. Anemia Panel: No results for input(s): VITAMINB12, FOLATE, FERRITIN, TIBC, IRON , RETICCTPCT in the last 72 hours. Sepsis Labs: Recent Labs  Lab 08/03/24 1308  LATICACIDVEN 1.1    Recent Results (from the past 240 hours)  Resp panel by RT-PCR (RSV, Flu A&B, Covid) Anterior Nasal Swab     Status: None   Collection Time: 08/03/24 12:00 PM   Specimen: Anterior Nasal Swab  Result Value Ref Range Status   SARS Coronavirus 2 by RT PCR NEGATIVE NEGATIVE Final   Influenza A by PCR NEGATIVE NEGATIVE Final   Influenza B by PCR NEGATIVE NEGATIVE Final    Comment: (NOTE) The Xpert Xpress SARS-CoV-2/FLU/RSV plus assay is intended as an aid in the diagnosis of influenza from Nasopharyngeal swab specimens and should not be used as a sole basis for treatment. Nasal washings and aspirates are unacceptable for Xpert Xpress SARS-CoV-2/FLU/RSV testing.  Fact Sheet for Patients: bloggercourse.com  Fact Sheet for Healthcare Providers: seriousbroker.it  This test is not yet approved or cleared by the [place]  FDA and has been authorized for detection and/or diagnosis of SARS-CoV-2 by FDA under an Emergency Use Authorization (EUA). This EUA will remain in effect (meaning this test can be used) for the duration of the COVID-19 declaration under Section  564(b)(1) of the Act, 21 U.S.C. section 360bbb-3(b)(1), unless the authorization is terminated or revoked.     Resp Syncytial Virus by PCR NEGATIVE NEGATIVE Final    Comment: (NOTE) Fact Sheet for Patients: bloggercourse.com  Fact Sheet for Healthcare Providers: seriousbroker.it  This test is not yet approved or cleared by the [place]  FDA and has been authorized for detection and/or diagnosis of SARS-CoV-2 by FDA under an Emergency Use Authorization (EUA). This EUA will remain in effect (meaning this test can be used) for the duration of the COVID-19 declaration under Section 564(b)(1) of the Act, 21 U.S.C. section 360bbb-3(b)(1), unless the authorization is terminated or revoked.  Performed at [facility], [address], [place], [place] [address]  Radiology Studies: No results found.  Scheduled Meds:  carvedilol   6.25 mg Oral BID WC   ciprofloxacin   500 mg Oral BID   doxycycline   100 mg Oral BID   DULoxetine   60 mg Oral BID   enoxaparin  (LOVENOX ) injection  40 mg Subcutaneous Q24H   furosemide   60 mg Intravenous BID   insulin  aspart  0-15 Units Subcutaneous TID WC   insulin  glargine-yfgn  30 Units Subcutaneous QHS   mirtazapine   30 mg Oral QHS   pantoprazole   40 mg Oral Daily   pramipexole   1 mg Oral QHS   pregabalin   75 mg Oral TID   sodium chloride  flush  3 mL Intravenous Q12H   spironolactone  25 mg Oral Daily   Continuous Infusions:  magnesium  sulfate bolus IVPB 2 g (08/07/24 1129)     LOS: 4 days    Time spent: 50 mins    Darcel Dawley, MD [facility]   If 7PM-7AM, please contact night-coverage

## 2024-08-08 DIAGNOSIS — J9621 Acute and chronic respiratory failure with hypoxia: Secondary | ICD-10-CM | POA: Diagnosis not present

## 2024-08-08 LAB — BASIC METABOLIC PANEL WITH GFR
Anion gap: 12 (ref 5–15)
BUN: 42 mg/dL — ABNORMAL HIGH (ref 8–23)
CO2: 31 mmol/L (ref 22–32)
Calcium: 8.7 mg/dL — ABNORMAL LOW (ref 8.9–10.3)
Chloride: 92 mmol/L — ABNORMAL LOW (ref 98–111)
Creatinine, Ser: 1.56 mg/dL — ABNORMAL HIGH (ref 0.44–1.00)
GFR, Estimated: 36 mL/min — ABNORMAL LOW
Glucose, Bld: 191 mg/dL — ABNORMAL HIGH (ref 70–99)
Potassium: 4.3 mmol/L (ref 3.5–5.1)
Sodium: 135 mmol/L (ref 135–145)

## 2024-08-08 LAB — GLUCOSE, CAPILLARY
Glucose-Capillary: 138 mg/dL — ABNORMAL HIGH (ref 70–99)
Glucose-Capillary: 136 mg/dL — ABNORMAL HIGH (ref 70–99)
Glucose-Capillary: 167 mg/dL — ABNORMAL HIGH (ref 70–99)
Glucose-Capillary: 208 mg/dL — ABNORMAL HIGH (ref 70–99)
Glucose-Capillary: 243 mg/dL — ABNORMAL HIGH (ref 70–99)

## 2024-08-08 NOTE — Progress Notes (Addendum)
 PROGRESS NOTE    Katelyn [name]  FMW:[id number] DOB: 1956/03/17 DOA: 08/03/2024 PCP: Ofilia Lamar CROME, MD   As per prior documentation: 68/F chronically ill with diastolic CHF, restrictive lung disease, NASH/cirrhosis, hypertension, diabetes, CAD, PAD, nonsustained V. tach, migraines, depression, RLS, thoracic and lumbar spine disease history of spinal cord stimulator, fusion, numerous surgeries, history of discitis with hardware removal presented to the ED with worsening dyspnea and orthopnea. - Recently admitted 9/25-9/30 with right shoulder abscess, in the background of known discitis sp hardware removal on long-term doxycycline  and ciprofloxacin .  She was treated with IV antibiotics in the hospital in consultation with infectious disease and transitioned back to doxycycline  and ciprofloxacin  on discharge.  Did undergo some diuresis due to volume overload while admitted. -Now with dyspnea, in the ED was in respiratory distress, hypertensive, placed on BiPAP, lab work noted hemoglobin 9.9, creatinine 1.0, BNP 193, troponin 30, Chest x-ray showed changes consistent with CHF including cardiomegaly, pulmonary edema, small effusions.  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 - Slowly improving with diuretics - Palliative care consulted and following with significant disease burden  08/08/2024: Patient seen.  Also also discussed with the patient's nurse.  Patient remains volume overloaded.  Will continue IV Lasix  for now.  Continue to monitor renal function and electrolytes.  Further management will depend on above.  BMP done today noted.  Serum creatinine of 1.6.  Continue renal panel daily.  Subjective: Feels better overall, breathing continues to improve  Assessment and Plan:  Acute on chronic respiratory failure with hypoxia Acute on chronic diastolic CHF - Patient presented with dyspnea on exertion, and respiratory distress in the ED, placed on BiPAP -Last echo was 9/25  noted EF 60-65%, indeterminate diastolic function, normal RV function. - Weaned off BiPAP, better diuresis yesterday, continue Lasix  60 mg twice daily, Aldactone and Coreg   - Poor candidate for SGLT2i - Increase activity, PT OT eval - Palliative following as well 07/29/2024: Continue IV Lasix .  Continue to monitor renal function and electrolytes.  Likely discharge when optimized.  Hypomagnesemia -Magnesium  of 1.5 noted on 08/07/2024.  Will repeat magnesium  in the morning.   History of discitis status post hardware removal Recent shoulder abscess - Continue home ciprofloxacin  and doxycycline  -Significant debility,  follow-up with orthopedics - Has essentially become bedbound since December   Hypertension - Diuresis and Coreg  as above 08/08/2024: Blood pressure is controlled (129/60 mmHg).   Hyperlipidemia CAD PAD - Not on antilipid medication - Continue home Coreg    Diabetes > 40 units qhs at home and mealtime insulin  - Labile, continue glargine and meal coverage   Neuropathy - Continue home Lyrica    NASH cirrhosis -Diuretics as noted above, albumin  is 2.5   Depression Anxiety - Continue home duloxetine  and Remeron    RLS - Continue home pramipexole    Restrictive lung disease Obesity OSA on CPAP - Continue CPAP   T-spine and L-spine disease Chronic pain -History of fusion , sp hardware removal due to infection as above - Continue home oxycodone    Goals: 68/F with chronic diastolic CHF, NASH/cirrhosis, numerous back surgeries, discitis/osteomyelitis, shoulder abscess, restrictive lung disease, with ongoing failure to thrive, requested palliative consult for goals of care> family meeting completed yesterday, she is considering getting hospice involved at discharge   DVT prophylaxis:      Lovenox  Code Status:              DNR/DNI  Family Communication:    Disposition Plan: Home pending improvement in  volume status Consultants:    Procedures:    Antimicrobials:    Objective: Vitals:   08/08/24 0400 08/08/24 0500 08/08/24 0745 08/08/24 1153  BP: (!) 130/46  (!) 153/73 129/60  Pulse:   94 87  Resp: 20  16 20   Temp: 98 F (36.7 C)  98.4 F (36.9 C) 98 F (36.7 C)  TempSrc: Oral  Oral Oral  SpO2: 96%  93% 95%  Weight:  93.5 kg    Height:        Intake/Output Summary (Last 24 hours) at 08/08/2024 1300 Last data filed at 08/08/2024 1030 Gross per 24 hour  Intake 243 ml  Output 2800 ml  Net -2557 ml   Filed Weights   08/06/24 0409 08/07/24 0401 08/08/24 0500  Weight: 97.2 kg 96.6 kg 93.5 kg    Examination:  General exam: Patient is obese.  Not in any distress.  Awake and alert.  HEENT: Patient is pale. Lungs: Clear to auscultation anteriorly. Abdomen: Obese, soft and nontender.   Extremities: 2-2+ bilateral lower extremity edema  Data Reviewed:   CBC: Recent Labs  Lab 08/03/24 1251 08/03/24 1308 08/04/24 0251 08/05/24 0218  WBC 6.2  --  4.7 5.3  NEUTROABS 4.9  --   --   --   HGB 9.9* 10.2*  9.9* 8.4* 7.9*  HCT 31.2* 30.0*  29.0* 26.2* 24.9*  MCV 92.0  --  92.3 92.2  PLT 144*  --  134* 112*   Basic Metabolic Panel: Recent Labs  Lab 08/03/24 1251 08/03/24 1308 08/04/24 0251 08/05/24 0218 08/06/24 0224 08/07/24 0218 08/08/24 0233  NA 138   < > 136 136 134* 136 135  K 4.3   < > 4.5 3.6 3.8 4.0 4.3  CL 105   < > 104 102 99 96* 92*  CO2 23  --  25 26 27 30 31   GLUCOSE 166*   < > 285* 140* 112* 151* 191*  BUN 21   < > 26* 35* 36* 38* 42*  CREATININE 0.93   < > 1.12* 1.25* 1.27* 1.68* 1.56*  CALCIUM  8.8*  --  8.1* 8.1* 8.3* 8.4* 8.7*  MG 1.5*  --   --   --   --  1.5*  --    < > = values in this interval not displayed.   GFR: Estimated Creatinine Clearance: 36.5 mL/min (A) (by C-G formula based on SCr of 1.56 mg/dL (H)). Liver Function Tests: Recent Labs  Lab 08/03/24 1251 08/04/24 0251  AST 55* 46*  ALT 32 25  ALKPHOS 206* 162*  BILITOT 0.7 0.6  PROT 6.6 5.7*  ALBUMIN  2.8* 2.5*    No results for input(s): LIPASE, AMYLASE in the last 168 hours. No results for input(s): AMMONIA in the last 168 hours. Coagulation Profile: No results for input(s): INR, PROTIME in the last 168 hours. Cardiac Enzymes: No results for input(s): CKTOTAL, CKMB, CKMBINDEX, TROPONINI in the last 168 hours. BNP (last 3 results) No results for input(s): PROBNP in the last 8760 hours. HbA1C: No results for input(s): HGBA1C in the last 72 hours. CBG: Recent Labs  Lab 08/07/24 1136 08/07/24 1547 08/07/24 2104 08/08/24 0629 08/08/24 1208  GLUCAP 154* 177* 293* 136* 167*   Lipid Profile: No results for input(s): CHOL, HDL, LDLCALC, TRIG, CHOLHDL, LDLDIRECT in the last 72 hours. Thyroid  Function Tests: No results for input(s): TSH, T4TOTAL, FREET4, T3FREE, THYROIDAB in the last 72 hours. Anemia Panel: No results for input(s): VITAMINB12, FOLATE, FERRITIN, TIBC, IRON , RETICCTPCT in  the last 72 hours. Urine analysis:    Component Value Date/Time   COLORURINE YELLOW 08/03/2024 1200   APPEARANCEUR CLEAR 08/03/2024 1200   LABSPEC 1.011 08/03/2024 1200   PHURINE 5.0 08/03/2024 1200   GLUCOSEU NEGATIVE 08/03/2024 1200   HGBUR NEGATIVE 08/03/2024 1200   BILIRUBINUR NEGATIVE 08/03/2024 1200   BILIRUBINUR negative 07/02/2024 1616   KETONESUR NEGATIVE 08/03/2024 1200   PROTEINUR NEGATIVE 08/03/2024 1200   UROBILINOGEN 0.2 07/02/2024 1616   UROBILINOGEN 0.2 01/31/2012 1504   NITRITE NEGATIVE 08/03/2024 1200   LEUKOCYTESUR NEGATIVE 08/03/2024 1200   Sepsis Labs: @LABRCNTIP (procalcitonin:4,lacticidven:4)  ) Recent Results (from the past 240 hours)  Resp panel by RT-PCR (RSV, Flu A&B, Covid) Anterior Nasal Swab     Status: None   Collection Time: 08/03/24 12:00 PM   Specimen: Anterior Nasal Swab  Result Value Ref Range Status   SARS Coronavirus 2 by RT PCR NEGATIVE NEGATIVE Final   Influenza A by PCR NEGATIVE NEGATIVE Final    Influenza B by PCR NEGATIVE NEGATIVE Final    Comment: (NOTE) The Xpert Xpress SARS-CoV-2/FLU/RSV plus assay is intended as an aid in the diagnosis of influenza from Nasopharyngeal swab specimens and should not be used as a sole basis for treatment. Nasal washings and aspirates are unacceptable for Xpert Xpress SARS-CoV-2/FLU/RSV testing.  Fact Sheet for Patients: bloggercourse.com  Fact Sheet for Healthcare Providers: seriousbroker.it  This test is not yet approved or cleared by the [place]  FDA and has been authorized for detection and/or diagnosis of SARS-CoV-2 by FDA under an Emergency Use Authorization (EUA). This EUA will remain in effect (meaning this test can be used) for the duration of the COVID-19 declaration under Section 564(b)(1) of the Act, 21 U.S.C. section 360bbb-3(b)(1), unless the authorization is terminated or revoked.     Resp Syncytial Virus by PCR NEGATIVE NEGATIVE Final    Comment: (NOTE) Fact Sheet for Patients: bloggercourse.com  Fact Sheet for Healthcare Providers: seriousbroker.it  This test is not yet approved or cleared by the [place]  FDA and has been authorized for detection and/or diagnosis of SARS-CoV-2 by FDA under an Emergency Use Authorization (EUA). This EUA will remain in effect (meaning this test can be used) for the duration of the COVID-19 declaration under Section 564(b)(1) of the Act, 21 U.S.C. section 360bbb-3(b)(1), unless the authorization is terminated or revoked.  Performed at [facility], [address] [place], [place] [address]      Radiology Studies: No results found.    Scheduled Meds:  carvedilol   6.25 mg Oral BID WC   ciprofloxacin   500 mg Oral BID   doxycycline   100 mg Oral BID   DULoxetine   60 mg Oral BID   enoxaparin  (LOVENOX ) injection  40 mg Subcutaneous Q24H   furosemide   60 mg  Intravenous BID   insulin  aspart  0-15 Units Subcutaneous TID WC   insulin  glargine-yfgn  30 Units Subcutaneous QHS   mirtazapine   30 mg Oral QHS   pantoprazole   40 mg Oral Daily   pramipexole   1 mg Oral QHS   pregabalin   75 mg Oral TID   sodium chloride  flush  3 mL Intravenous Q12H   spironolactone  25 mg Oral Daily   Continuous Infusions:   LOS: 5 days    Time spent: 55 mins    Leatrice chapel, MD [facility]   08/08/2024, 1:00 PM

## 2024-08-08 NOTE — Plan of Care (Signed)

## 2024-08-08 NOTE — Progress Notes (Signed)
 Physical Therapy Treatment Patient Details Name: Katelyn [name] MRN: [id number] DOB: 01/03/1956 Today's Date: 08/08/2024   History of Present Illness 68 y.o. F adm 08/03/24 with increased SOB, HTN, acute on chronic respiratory failure and CHF. PMHx: Admission 9/25-9/30 for Rt shoulder abscess. AVN and advanced glenohumeral arthritis (awaiting sx), multiple lumbar surgeries, anxiety, chronic low back pain, DM2, obesity, OSA, LBBB, HTN, HLD, CKD, CAD.    PT Comments  Pt pleasant and progressing with gait with use of hemiwalker with cues for posture and sequence. Pt educated for HEp and continued progression. Pt attempting to use RUE to move chair leg rest on arrival and needed cues for awareness that with humeral dislocation she shouldn't use RUE functionally. Pt needs cues and reinforcement due to lack of recall and awareness. Will continue to follow with HHPT appropriate.   If plan is discharge home, recommend the following: A little help with walking and/or transfers;A lot of help with bathing/dressing/bathroom;Assist for transportation   Can travel by private vehicle        Equipment Recommendations  Other (comment) psychologist, educational)    Recommendations for Other Services       Precautions / Restrictions Precautions Precautions: Fall Recall of Precautions/Restrictions: Intact Restrictions Other Position/Activity Restrictions: NWB , humerus dislocation     Mobility  Bed Mobility               General bed mobility comments: in chair on arrival and end of session    Transfers Overall transfer level: Needs assistance   Transfers: Sit to/from Stand Sit to Stand: Contact guard assist           General transfer comment: cues for hand placement and to prevent pt using RUE to push off surface, cues for direction and safety with turning to chair    Ambulation/Gait Ambulation/Gait assistance: Contact guard assist Gait Distance (Feet): 22 Feet Assistive device:  Hemi-walker Gait Pattern/deviations: Step-through pattern, Decreased stride length, Trunk flexed   Gait velocity interpretation: <1.8 ft/sec, indicate of risk for recurrent falls   General Gait Details: cues for posture, sequence, safety and progression. Pt with flexed trunk and reliant on hemiwalker for UB support   Stairs             Wheelchair Mobility     Tilt Bed    Modified Rankin (Stroke Patients Only)       Balance Overall balance assessment: Needs assistance   Sitting balance-Leahy Scale: Fair     Standing balance support: Single extremity supported, Reliant on assistive device for balance, During functional activity Standing balance-Leahy Scale: Poor Standing balance comment: hemiwalker in standing                            Communication Communication Communication: No apparent difficulties  Cognition Arousal: Alert Behavior During Therapy: WFL for tasks assessed/performed   PT - Cognitive impairments: Memory                       PT - Cognition Comments: decreased recall not to push with RUE or use it functionally Following commands: Intact      Cueing Cueing Techniques: Verbal cues, Gestural cues  Exercises General Exercises - Lower Extremity Long Arc Quad: AROM, Both, 20 reps, Seated, Strengthening Hip Flexion/Marching: AROM, Both, 10 reps, Seated, Strengthening    General Comments        Pertinent Vitals/Pain Pain Assessment Pain Assessment: 0-10 Pain Score: 3  Pain  Location: RUE when she forgets and moves it Pain Descriptors / Indicators: Aching, Sore Pain Intervention(s): Limited activity within patient's tolerance    Home Living                          Prior Function            PT Goals (current goals can now be found in the care plan section) Progress towards PT goals: Progressing toward goals    Frequency    Min 1X/week      PT Plan      Co-evaluation              AM-PAC  PT 6 Clicks Mobility   Outcome Measure  Help needed turning from your back to your side while in a flat bed without using bedrails?: A Little Help needed moving from lying on your back to sitting on the side of a flat bed without using bedrails?: A Little Help needed moving to and from a bed to a chair (including a wheelchair)?: A Little Help needed standing up from a chair using your arms (e.g., wheelchair or bedside chair)?: A Little Help needed to walk in hospital room?: A Lot Help needed climbing 3-5 steps with a railing? : Total 6 Click Score: 15    End of Session   Activity Tolerance: Patient tolerated treatment well Patient left: in chair;with call bell/phone within reach;with chair alarm set Nurse Communication: Mobility status PT Visit Diagnosis: Other abnormalities of gait and mobility (R26.89);Muscle weakness (generalized) (M62.81)     Time: 8846-8793 PT Time Calculation (min) (ACUTE ONLY): 13 min  Charges:    $Gait Training: 8-22 mins PT General Charges $$ ACUTE PT VISIT: 1 Visit                     Lenoard SQUIBB, PT Acute Rehabilitation Services Office: [phone]    Lenoard NOVAK Katelyn [name] 08/08/2024, 1:46 PM

## 2024-08-08 NOTE — Progress Notes (Signed)
   08/08/24 2324  BiPAP/CPAP/SIPAP  BiPAP/CPAP/SIPAP Pt Type Adult  BiPAP/CPAP/SIPAP DREAMSTATIOND  Reason BIPAP/CPAP not in use  (pt self wears)  Mask Type Full face mask  Mask Size Small  IPAP 10 cmH20  EPAP 5 cmH2O  FiO2 (%) 28 %  Flow Rate 2 lpm  Patient Home Machine No  Patient Home Mask No  Patient Home Tubing No  Auto Titrate No  Device Plugged into RED Power Outlet Yes  BiPAP/CPAP /SiPAP Vitals  Pulse Rate 86  SpO2 96 %  Bilateral Breath Sounds Diminished

## 2024-08-09 DIAGNOSIS — J9621 Acute and chronic respiratory failure with hypoxia: Secondary | ICD-10-CM | POA: Diagnosis not present

## 2024-08-09 LAB — RENAL FUNCTION PANEL
Albumin: 2.8 g/dL — ABNORMAL LOW (ref 3.5–5.0)
Anion gap: 11 (ref 5–15)
BUN: 47 mg/dL — ABNORMAL HIGH (ref 8–23)
CO2: 29 mmol/L (ref 22–32)
Calcium: 8.6 mg/dL — ABNORMAL LOW (ref 8.9–10.3)
Chloride: 95 mmol/L — ABNORMAL LOW (ref 98–111)
Creatinine, Ser: 1.7 mg/dL — ABNORMAL HIGH (ref 0.44–1.00)
GFR, Estimated: 33 mL/min — ABNORMAL LOW (ref >60)
Glucose, Bld: 175 mg/dL — ABNORMAL HIGH (ref 70–99)
Phosphorus: 4.6 mg/dL (ref 2.5–4.6)
Potassium: 4.1 mmol/L (ref 3.5–5.1)
Sodium: 135 mmol/L (ref 135–145)

## 2024-08-09 LAB — BASIC METABOLIC PANEL WITH GFR
Anion gap: 11 (ref 5–15)
BUN: 47 mg/dL — ABNORMAL HIGH (ref 8–23)
CO2: 28 mmol/L (ref 22–32)
Calcium: 8.5 mg/dL — ABNORMAL LOW (ref 8.9–10.3)
Chloride: 95 mmol/L — ABNORMAL LOW (ref 98–111)
Creatinine, Ser: 1.74 mg/dL — ABNORMAL HIGH (ref 0.44–1.00)
GFR, Estimated: 32 mL/min — ABNORMAL LOW (ref >60)
Glucose, Bld: 178 mg/dL — ABNORMAL HIGH (ref 70–99)
Potassium: 4.1 mmol/L (ref 3.5–5.1)
Sodium: 134 mmol/L — ABNORMAL LOW (ref 135–145)

## 2024-08-09 LAB — GLUCOSE, CAPILLARY
Glucose-Capillary: 141 mg/dL — ABNORMAL HIGH (ref 70–99)
Glucose-Capillary: 166 mg/dL — ABNORMAL HIGH (ref 70–99)
Glucose-Capillary: 138 mg/dL — ABNORMAL HIGH (ref 70–99)
Glucose-Capillary: 235 mg/dL — ABNORMAL HIGH (ref 70–99)

## 2024-08-09 LAB — MAGNESIUM: Magnesium: 1.5 mg/dL — ABNORMAL LOW (ref 1.7–2.4)

## 2024-08-09 NOTE — Progress Notes (Signed)
 PROGRESS NOTE    Katelyn [name]  FMW:[id number] DOB: 1956-04-19 DOA: 08/03/2024 PCP: Ofilia Lamar CROME, MD   As per prior documentation: 68/F chronically ill with diastolic CHF, restrictive lung disease, NASH/cirrhosis, hypertension, diabetes, CAD, PAD, nonsustained V. tach, migraines, depression, RLS, thoracic and lumbar spine disease history of spinal cord stimulator, fusion, numerous surgeries, history of discitis with hardware removal presented to the ED with worsening dyspnea and orthopnea. - Recently admitted 9/25-9/30 with right shoulder abscess, in the background of known discitis sp hardware removal on long-term doxycycline  and ciprofloxacin .  She was treated with IV antibiotics in the hospital in consultation with infectious disease and transitioned back to doxycycline  and ciprofloxacin  on discharge.  Did undergo some diuresis due to volume overload while admitted. -Now with dyspnea, in the ED was in respiratory distress, hypertensive, placed on BiPAP, lab work noted hemoglobin 9.9, creatinine 1.0, BNP 193, troponin 30, Chest x-ray showed changes consistent with CHF including cardiomegaly, pulmonary edema, small effusions.  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 - Slowly improving with diuretics - Palliative care consulted and following with significant disease burden  08/08/2024: Patient seen.  Also also discussed with the patient's nurse.  Patient remains volume overloaded.  Will continue IV Lasix  for now.  Continue to monitor renal function and electrolytes.  Further management will depend on above.  BMP done today noted.  Serum creatinine of 1.6.  Continue renal panel daily.  Subjective: Feels better overall, breathing continues to improve  Assessment and Plan:  Acute on chronic respiratory failure with hypoxia Acute on chronic diastolic CHF - Patient presented with dyspnea on exertion, and respiratory distress in the ED, placed on BiPAP -Last echo was 9/25  noted EF 60-65%, indeterminate diastolic function, normal RV function. - Weaned off BiPAP, better diuresis yesterday, continue Lasix  60 mg twice daily, Aldactone and Coreg   - Poor candidate for SGLT2i - Increase activity, PT OT eval - Palliative following as well 07/29/2024: Continue IV Lasix .  Continue to monitor renal function and electrolytes.  Likely discharge when optimized.  Hypomagnesemia -Magnesium  of 1.5 noted on 08/07/2024.  Will repeat magnesium  in the morning.   History of discitis status post hardware removal Recent shoulder abscess - Continue home ciprofloxacin  and doxycycline  -Significant debility,  follow-up with orthopedics - Has essentially become bedbound since December   Hypertension - Diuresis and Coreg  as above 08/08/2024: Blood pressure is controlled (129/60 mmHg).   Hyperlipidemia CAD PAD - Not on antilipid medication - Continue home Coreg    Diabetes > 40 units qhs at home and mealtime insulin  - Labile, continue glargine and meal coverage   Neuropathy - Continue home Lyrica    NASH cirrhosis -Diuretics as noted above, albumin  is 2.5   Depression Anxiety - Continue home duloxetine  and Remeron    RLS - Continue home pramipexole    Restrictive lung disease Obesity OSA on CPAP - Continue CPAP   T-spine and L-spine disease Chronic pain -History of fusion , sp hardware removal due to infection as above - Continue home oxycodone    Goals: 68/F with chronic diastolic CHF, NASH/cirrhosis, numerous back surgeries, discitis/osteomyelitis, shoulder abscess, restrictive lung disease, with ongoing failure to thrive, requested palliative consult for goals of care> family meeting completed yesterday, she is considering getting hospice involved at discharge   DVT prophylaxis:      Lovenox  Code Status:              DNR/DNI  Family Communication:    Disposition Plan: Home pending improvement in  volume status Consultants:    Procedures:    Antimicrobials:    Objective: Vitals:   08/09/24 0457 08/09/24 0500 08/09/24 0825 08/09/24 1121  BP:   (!) 157/58 122/77  Pulse:   91 82  Resp:  (!) 30 (!) 21 16  Temp:   98.4 F (36.9 C) 98.1 F (36.7 C)  TempSrc:   Oral Oral  SpO2:   91% 95%  Weight: 90.4 kg     Height:        Intake/Output Summary (Last 24 hours) at 08/09/2024 1155 Last data filed at 08/09/2024 0900 Gross per 24 hour  Intake 720 ml  Output 2600 ml  Net -1880 ml   Filed Weights   08/07/24 0401 08/08/24 0500 08/09/24 0457  Weight: 96.6 kg 93.5 kg 90.4 kg    Examination:  General exam: Patient is obese.  Not in any distress.  Awake and alert.  HEENT: Patient is pale. Lungs: Clear to auscultation anteriorly. Abdomen: Obese, soft and nontender.   Extremities: 2-2+ bilateral lower extremity edema  Data Reviewed:   CBC: Recent Labs  Lab 08/03/24 1251 08/03/24 1308 08/04/24 0251 08/05/24 0218  WBC 6.2  --  4.7 5.3  NEUTROABS 4.9  --   --   --   HGB 9.9* 10.2*  9.9* 8.4* 7.9*  HCT 31.2* 30.0*  29.0* 26.2* 24.9*  MCV 92.0  --  92.3 92.2  PLT 144*  --  134* 112*   Basic Metabolic Panel: Recent Labs  Lab 08/03/24 1251 08/03/24 1308 08/06/24 0224 08/07/24 0218 08/08/24 0233 08/09/24 0222 08/09/24 0224  NA 138   < > 134* 136 135 134* 135  K 4.3   < > 3.8 4.0 4.3 4.1 4.1  CL 105   < > 99 96* 92* 95* 95*  CO2 23   < > 27 30 31 28 29   GLUCOSE 166*   < > 112* 151* 191* 178* 175*  BUN 21   < > 36* 38* 42* 47* 47*  CREATININE 0.93   < > 1.27* 1.68* 1.56* 1.74* 1.70*  CALCIUM  8.8*   < > 8.3* 8.4* 8.7* 8.5* 8.6*  MG 1.5*  --   --  1.5*  --  1.5*  --   PHOS  --   --   --   --   --   --  4.6   < > = values in this interval not displayed.   GFR: Estimated Creatinine Clearance: 32.9 mL/min (A) (by C-G formula based on SCr of 1.7 mg/dL (H)). Liver Function Tests: Recent Labs  Lab 08/03/24 1251 08/04/24 0251 08/09/24 0224  AST 55* 46*  --   ALT 32 25  --   ALKPHOS 206* 162*  --    BILITOT 0.7 0.6  --   PROT 6.6 5.7*  --   ALBUMIN  2.8* 2.5* 2.8*   No results for input(s): LIPASE, AMYLASE in the last 168 hours. No results for input(s): AMMONIA in the last 168 hours. Coagulation Profile: No results for input(s): INR, PROTIME in the last 168 hours. Cardiac Enzymes: No results for input(s): CKTOTAL, CKMB, CKMBINDEX, TROPONINI in the last 168 hours. BNP (last 3 results) No results for input(s): PROBNP in the last 8760 hours. HbA1C: No results for input(s): HGBA1C in the last 72 hours. CBG: Recent Labs  Lab 08/08/24 1208 08/08/24 1553 08/08/24 2147 08/09/24 0618 08/09/24 1118  GLUCAP 167* 208* 243* 141* 166*   Lipid Profile: No results for input(s): CHOL, HDL, LDLCALC, TRIG,  CHOLHDL, LDLDIRECT in the last 72 hours. Thyroid  Function Tests: No results for input(s): TSH, T4TOTAL, FREET4, T3FREE, THYROIDAB in the last 72 hours. Anemia Panel: No results for input(s): VITAMINB12, FOLATE, FERRITIN, TIBC, IRON , RETICCTPCT in the last 72 hours. Urine analysis:    Component Value Date/Time   COLORURINE YELLOW 08/03/2024 1200   APPEARANCEUR CLEAR 08/03/2024 1200   LABSPEC 1.011 08/03/2024 1200   PHURINE 5.0 08/03/2024 1200   GLUCOSEU NEGATIVE 08/03/2024 1200   HGBUR NEGATIVE 08/03/2024 1200   BILIRUBINUR NEGATIVE 08/03/2024 1200   BILIRUBINUR negative 07/02/2024 1616   KETONESUR NEGATIVE 08/03/2024 1200   PROTEINUR NEGATIVE 08/03/2024 1200   UROBILINOGEN 0.2 07/02/2024 1616   UROBILINOGEN 0.2 01/31/2012 1504   NITRITE NEGATIVE 08/03/2024 1200   LEUKOCYTESUR NEGATIVE 08/03/2024 1200   Sepsis Labs: @LABRCNTIP (procalcitonin:4,lacticidven:4)  ) Recent Results (from the past 240 hours)  Resp panel by RT-PCR (RSV, Flu A&B, Covid) Anterior Nasal Swab     Status: None   Collection Time: 08/03/24 12:00 PM   Specimen: Anterior Nasal Swab  Result Value Ref Range Status   SARS Coronavirus 2 by RT PCR NEGATIVE  NEGATIVE Final   Influenza A by PCR NEGATIVE NEGATIVE Final   Influenza B by PCR NEGATIVE NEGATIVE Final    Comment: (NOTE) The Xpert Xpress SARS-CoV-2/FLU/RSV plus assay is intended as an aid in the diagnosis of influenza from Nasopharyngeal swab specimens and should not be used as a sole basis for treatment. Nasal washings and aspirates are unacceptable for Xpert Xpress SARS-CoV-2/FLU/RSV testing.  Fact Sheet for Patients: bloggercourse.com  Fact Sheet for Healthcare Providers: seriousbroker.it  This test is not yet approved or cleared by the [place]  FDA and has been authorized for detection and/or diagnosis of SARS-CoV-2 by FDA under an Emergency Use Authorization (EUA). This EUA will remain in effect (meaning this test can be used) for the duration of the COVID-19 declaration under Section 564(b)(1) of the Act, 21 U.S.C. section 360bbb-3(b)(1), unless the authorization is terminated or revoked.     Resp Syncytial Virus by PCR NEGATIVE NEGATIVE Final    Comment: (NOTE) Fact Sheet for Patients: bloggercourse.com  Fact Sheet for Healthcare Providers: seriousbroker.it  This test is not yet approved or cleared by the [place]  FDA and has been authorized for detection and/or diagnosis of SARS-CoV-2 by FDA under an Emergency Use Authorization (EUA). This EUA will remain in effect (meaning this test can be used) for the duration of the COVID-19 declaration under Section 564(b)(1) of the Act, 21 U.S.C. section 360bbb-3(b)(1), unless the authorization is terminated or revoked.  Performed at [facility], [address] [place], [place] [address]      Radiology Studies: No results found.    Scheduled Meds:  carvedilol   6.25 mg Oral BID WC   ciprofloxacin   500 mg Oral BID   doxycycline   100 mg Oral BID   DULoxetine   60 mg Oral BID   enoxaparin  (LOVENOX )  injection  40 mg Subcutaneous Q24H   furosemide   60 mg Intravenous BID   insulin  aspart  0-15 Units Subcutaneous TID WC   insulin  glargine-yfgn  30 Units Subcutaneous QHS   mirtazapine   30 mg Oral QHS   pantoprazole   40 mg Oral Daily   pramipexole   1 mg Oral QHS   pregabalin   75 mg Oral TID   sodium chloride  flush  3 mL Intravenous Q12H   spironolactone  25 mg Oral Daily   Continuous Infusions:   LOS: 6 days  Time spent: 55 mins    Sigurd Pac, MD [facility]   08/09/2024, 11:55 AM

## 2024-08-09 NOTE — Progress Notes (Signed)
   08/09/24 2315  BiPAP/CPAP/SIPAP  BiPAP/CPAP/SIPAP Pt Type Adult  BiPAP/CPAP/SIPAP DREAMSTATIOND  Reason BIPAP/CPAP not in use  (pt self wears)  Mask Type Full face mask  Mask Size Small  IPAP 10 cmH20  EPAP 5 cmH2O  FiO2 (%) 28 %  Flow Rate 2 lpm  Patient Home Machine No  Patient Home Mask No  Patient Home Tubing No  Auto Titrate No  CPAP/SIPAP surface wiped down Yes  Device Plugged into RED Power Outlet Yes  BiPAP/CPAP /SiPAP Vitals  Pulse Rate 93  Resp 20  SpO2 93 %  Bilateral Breath Sounds Diminished

## 2024-08-09 NOTE — Progress Notes (Signed)
 Mobility Specialist Progress Note:    08/09/24 1012  Mobility  Activity Ambulated with assistance  Level of Assistance Contact guard assist, steadying assist  Assistive Device  (Hemi)  Distance Ambulated (ft) 15 ft  Activity Response Tolerated well  Mobility Referral Yes  Mobility visit 1 Mobility  Mobility Specialist Start Time (ACUTE ONLY) 1012  Mobility Specialist Stop Time (ACUTE ONLY) 1020  Mobility Specialist Time Calculation (min) (ACUTE ONLY) 8 min   Pt received in bed, agreeable to mobility session. Ambulated in room with Hemi walker and gait belt, d/t frequent unsteadiness. Tolerated well, asx throughout. Returned back to bed. Husband in room. All needs met.    Caisen Mangas Mobility Specialist Please contact via Special Educational Needs Teacher or  Rehab office at [phone]

## 2024-08-09 NOTE — Plan of Care (Incomplete)
 Problem: Education: Goal: Ability to describe self-care measures that may prevent or decrease complications (Diabetes Survival Skills Education) will improve 08/09/2024 0654 by Jennine Benton HERO, RN Outcome: Progressing 08/08/2024 1857 by Jennine Benton HERO, RN Outcome: Progressing Goal: Individualized Educational Video(s) 08/09/2024 0654 by Jennine Benton HERO, RN Outcome: Progressing 08/08/2024 1857 by Jennine Benton HERO, RN Outcome: Progressing   Problem: Coping: Goal: Ability to adjust to condition or change in health will improve 08/09/2024 0654 by Jennine Benton HERO, RN Outcome: Progressing 08/08/2024 1857 by Jennine Benton HERO, RN Outcome: Progressing   Problem: Fluid Volume: Goal: Ability to maintain a balanced intake and output will improve 08/09/2024 0654 by Jennine Benton HERO, RN Outcome: Progressing 08/08/2024 1857 by Jennine Benton HERO, RN Outcome: Progressing   Problem: Health Behavior/Discharge Planning: Goal: Ability to identify and utilize available resources and services will improve 08/09/2024 0654 by Jennine Benton HERO, RN Outcome: Progressing 08/08/2024 1857 by Jennine Benton HERO, RN Outcome: Progressing Goal: Ability to manage health-related needs will improve 08/09/2024 0654 by Jennine Benton HERO, RN Outcome: Progressing 08/08/2024 1857 by Jennine Benton HERO, RN Outcome: Progressing   Problem: Metabolic: Goal: Ability to maintain appropriate glucose levels will improve 08/09/2024 0654 by Jennine Benton HERO, RN Outcome: Progressing 08/08/2024 1857 by Jennine Benton HERO, RN Outcome: Progressing   Problem: Nutritional: Goal: Maintenance of adequate nutrition will improve 08/09/2024 0654 by Jennine Benton HERO, RN Outcome: Progressing 08/08/2024 1857 by Jennine Benton HERO, RN Outcome: Progressing Goal: Progress toward achieving an optimal weight will improve 08/09/2024 0654 by Jennine Benton HERO, RN Outcome: Progressing 08/08/2024 1857 by Jennine Benton HERO, RN Outcome: Progressing   Problem: Skin Integrity: Goal: Risk for impaired skin integrity will decrease 08/09/2024 0654 by Jennine Benton HERO, RN Outcome: Progressing 08/08/2024 1857 by Jennine Benton HERO, RN Outcome: Progressing   Problem: Tissue Perfusion: Goal: Adequacy of tissue perfusion will improve 08/09/2024 0654 by Jennine Benton HERO, RN Outcome: Progressing 08/08/2024 1857 by Jennine Benton HERO, RN Outcome: Progressing   Problem: Education: Goal: Knowledge of General Education information will improve Description: Including pain rating scale, medication(s)/side effects and non-pharmacologic comfort measures 08/09/2024 0654 by Jennine Benton HERO, RN Outcome: Progressing 08/08/2024 1857 by Jennine Benton HERO, RN Outcome: Progressing   Problem: Health Behavior/Discharge Planning: Goal: Ability to manage health-related needs will improve 08/09/2024 0654 by Jennine Benton HERO, RN Outcome: Progressing 08/08/2024 1857 by Jennine Benton HERO, RN Outcome: Progressing   Problem: Clinical Measurements: Goal: Ability to maintain clinical measurements within normal limits will improve 08/09/2024 0654 by Jennine Benton HERO, RN Outcome: Progressing 08/08/2024 1857 by Jennine Benton HERO, RN Outcome: Progressing Goal: Will remain free from infection 08/09/2024 0654 by Jennine Benton HERO, RN Outcome: Progressing 08/08/2024 1857 by Jennine Benton HERO, RN Outcome: Progressing Goal: Diagnostic test results will improve 08/09/2024 0654 by Jennine Benton HERO, RN Outcome: Progressing 08/08/2024 1857 by Jennine Benton HERO, RN Outcome: Progressing Goal: Respiratory complications will improve 08/09/2024 0654 by Jennine Benton HERO, RN Outcome: Progressing 08/08/2024 1857 by Jennine Benton HERO, RN Outcome: Progressing Goal: Cardiovascular complication will be avoided 08/09/2024 0654 by Jennine Benton HERO, RN Outcome: Progressing 08/08/2024 1857 by Jennine Benton HERO,  RN Outcome: Progressing   Problem: Activity: Goal: Risk for activity intolerance will decrease 08/09/2024 0654 by Jennine Benton HERO, RN Outcome: Progressing 08/08/2024 1857 by Jennine Benton HERO, RN Outcome: Progressing   Problem: Nutrition: Goal: Adequate nutrition will be maintained 08/09/2024 0654 by Jennine Benton HERO, RN Outcome: Progressing 08/08/2024 1857 by Jennine Benton HERO, RN Outcome: Progressing  Problem: Coping: Goal: Level of anxiety will decrease 08/09/2024 0654 by Jennine Benton HERO, RN Outcome: Progressing 08/08/2024 1857 by Jennine Benton HERO, RN Outcome: Progressing   Problem: Elimination: Goal: Will not experience complications related to bowel motility 08/09/2024 0654 by Jennine Benton HERO, RN Outcome: Progressing 08/08/2024 1857 by Jennine Benton HERO, RN Outcome: Progressing Goal: Will not experience complications related to urinary retention 08/09/2024 0654 by Jennine Benton HERO, RN Outcome: Progressing 08/08/2024 1857 by Jennine Benton HERO, RN Outcome: Progressing   Problem: Pain Managment: Goal: General experience of comfort will improve and/or be controlled 08/09/2024 0654 by Jennine Benton HERO, RN Outcome: Progressing 08/08/2024 1857 by Jennine Benton HERO, RN Outcome: Progressing   Problem: Safety: Goal: Ability to remain free from injury will improve 08/09/2024 0654 by Jennine Benton HERO, RN Outcome: Progressing 08/08/2024 1857 by Jennine Benton HERO, RN Outcome: Progressing   Problem: Skin Integrity: Goal: Risk for impaired skin integrity will decrease 08/09/2024 0654 by Jennine Benton HERO, RN Outcome: Progressing 08/08/2024 1857 by Jennine Benton HERO, RN Outcome: Progressing   Problem: Education: Goal: Ability to demonstrate management of disease process will improve 08/09/2024 0654 by Jennine Benton HERO, RN Outcome: Progressing 08/08/2024 1857 by Jennine Benton HERO, RN Outcome: Progressing Goal: Ability to verbalize understanding  of medication therapies will improve 08/09/2024 0654 by Jennine Benton HERO, RN Outcome: Progressing 08/08/2024 1857 by Jennine Benton HERO, RN Outcome: Progressing Goal: Individualized Educational Video(s) 08/09/2024 0654 by Jennine Benton HERO, RN Outcome: Progressing 08/08/2024 1857 by Jennine Benton HERO, RN Outcome: Progressing   Problem: Activity: Goal: Capacity to carry out activities will improve 08/09/2024 0654 by Jennine Benton HERO, RN Outcome: Progressing 08/08/2024 1857 by Jennine Benton HERO, RN Outcome: Progressing   Problem: Cardiac: Goal: Ability to achieve and maintain adequate cardiopulmonary perfusion will improve 08/09/2024 0654 by Jennine Benton HERO, RN Outcome: Progressing 08/08/2024 1857 by Jennine Benton HERO, RN Outcome: Progressing

## 2024-08-10 DIAGNOSIS — J9621 Acute and chronic respiratory failure with hypoxia: Secondary | ICD-10-CM | POA: Diagnosis not present

## 2024-08-10 DIAGNOSIS — Z515 Encounter for palliative care: Secondary | ICD-10-CM | POA: Diagnosis not present

## 2024-08-10 DIAGNOSIS — Z7189 Other specified counseling: Secondary | ICD-10-CM | POA: Diagnosis not present

## 2024-08-10 DIAGNOSIS — I5033 Acute on chronic diastolic (congestive) heart failure: Secondary | ICD-10-CM | POA: Diagnosis not present

## 2024-08-10 LAB — RENAL FUNCTION PANEL
Albumin: 3.1 g/dL — ABNORMAL LOW (ref 3.5–5.0)
Anion gap: 11 (ref 5–15)
BUN: 44 mg/dL — ABNORMAL HIGH (ref 8–23)
CO2: 28 mmol/L (ref 22–32)
Calcium: 8.8 mg/dL — ABNORMAL LOW (ref 8.9–10.3)
Chloride: 97 mmol/L — ABNORMAL LOW (ref 98–111)
Creatinine, Ser: 1.48 mg/dL — ABNORMAL HIGH (ref 0.44–1.00)
GFR, Estimated: 39 mL/min — ABNORMAL LOW (ref >60)
Glucose, Bld: 160 mg/dL — ABNORMAL HIGH (ref 70–99)
Phosphorus: 4.4 mg/dL (ref 2.5–4.6)
Potassium: 4.1 mmol/L (ref 3.5–5.1)
Sodium: 136 mmol/L (ref 135–145)

## 2024-08-10 LAB — GLUCOSE, CAPILLARY
Glucose-Capillary: 141 mg/dL — ABNORMAL HIGH (ref 70–99)
Glucose-Capillary: 178 mg/dL — ABNORMAL HIGH (ref 70–99)
Glucose-Capillary: 237 mg/dL — ABNORMAL HIGH (ref 70–99)
Glucose-Capillary: 199 mg/dL — ABNORMAL HIGH (ref 70–99)

## 2024-08-10 MED ORDER — MAGNESIUM SULFATE 4 GM/100ML IV SOLN
4.0000 g | Freq: Once | INTRAVENOUS | Status: AC
  Administered 2024-08-10: 4 g via INTRAVENOUS
  Filled 2024-08-10: qty 100

## 2024-08-10 NOTE — Progress Notes (Signed)
 PROGRESS NOTE    Katelyn [name]  FMW:[id number] DOB: Aug 20, 1956 DOA: 08/03/2024 PCP: Ofilia Lamar CROME, MD   As per prior documentation: 68/F chronically ill with diastolic CHF, restrictive lung disease, NASH/cirrhosis, hypertension, diabetes, CAD, PAD, nonsustained V. tach, migraines, depression, RLS, thoracic and lumbar spine disease history of spinal cord stimulator, fusion, numerous surgeries, history of discitis with hardware removal presented to the ED with worsening dyspnea and orthopnea. - Recently admitted 9/25-9/30 with right shoulder abscess, in the background of known discitis sp hardware removal on long-term doxycycline  and ciprofloxacin .  She was treated with IV antibiotics in the hospital in consultation with infectious disease and transitioned back to doxycycline  and ciprofloxacin  on discharge.  Did undergo some diuresis due to volume overload while admitted. -Now with dyspnea, in the ED was in respiratory distress, hypertensive, placed on BiPAP, lab work noted hemoglobin 9.9, creatinine 1.0, BNP 193, troponin 30, Chest x-ray showed changes consistent with CHF including cardiomegaly, pulmonary edema, small effusions.  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 - Slowly improving with diuretics - Palliative care consulted and following with significant disease burden  08/08/2024: Patient seen.  Also also discussed with the patient's nurse.  Patient remains volume overloaded.  Will continue IV Lasix  for now.  Continue to monitor renal function and electrolytes.  Further management will depend on above.  BMP done today noted.  Serum creatinine of 1.6.  Continue renal panel daily.  08/09/2024: Patient seen alongside patient's husband.  Patient remains volume overloaded.  Serum creatinine of 1.7 noted.  Continue to monitor renal function and electrolytes.  08/10/2024: Patient continues to improve.  Serum creatinine has improved from 1.7-1.48.  Continue to monitor renal  function and electrolytes.  Possible discharge back home tomorrow on oral torsemide  40 mg p.o. twice daily.  Subjective: No new complaints.  Assessment and Plan:  Acute on chronic respiratory failure with hypoxia Acute on chronic diastolic CHF - Patient presented with dyspnea on exertion, and respiratory distress in the ED, placed on BiPAP -Last echo was 9/25 noted EF 60-65%, indeterminate diastolic function, normal RV function. - Weaned off BiPAP, better diuresis yesterday, continue Lasix  60 mg twice daily, Aldactone and Coreg   - Poor candidate for SGLT2i - Increase activity, PT OT eval - Palliative following as well 08/10/2024: Continue IV Lasix .  Continue to monitor renal function and electrolytes.    Hypomagnesemia - Last magnesium  of 1.5. - Continue to monitor and replete (IV magnesium  4 g x 1 dose ordered).     History of discitis status post hardware removal Recent shoulder abscess - Continue home ciprofloxacin  and doxycycline  -Significant debility,  follow-up with orthopedics - Has essentially become bedbound since December   Hypertension - Diuresis and Coreg  as above -Blood pressure is controlled.   Hyperlipidemia CAD PAD - Not on antilipid medication - Continue home Coreg    Diabetes > 40 units qhs at home and mealtime insulin  - Labile, continue glargine and meal coverage   Neuropathy - Continue home Lyrica    NASH cirrhosis -Diuretics as noted above, albumin  is 2.5   Depression Anxiety - Continue home duloxetine  and Remeron    RLS - Continue home pramipexole    Restrictive lung disease Obesity OSA on CPAP - Continue CPAP   T-spine and L-spine disease Chronic pain -History of fusion , sp hardware removal due to infection as above - Continue home oxycodone    Goals: 68/F with chronic diastolic CHF, NASH/cirrhosis, numerous back surgeries, discitis/osteomyelitis, shoulder abscess, restrictive lung disease, with ongoing failure  to thrive, requested  palliative consult for goals of care> family meeting completed yesterday, she is considering getting hospice involved at discharge   DVT prophylaxis:      Lovenox  Code Status:              DNR/DNI  Family Communication:    Disposition Plan: Home pending improvement in volume status Consultants:    Procedures:   Antimicrobials:    Objective: Vitals:   08/10/24 0400 08/10/24 0735 08/10/24 1130 08/10/24 1545  BP:  (!) 117/56 (!) 120/98 98/66  Pulse:  82 82 78  Resp:  18 18 14   Temp:  97.7 F (36.5 C) 98.2 F (36.8 C) 98.3 F (36.8 C)  TempSrc:  Oral Oral Oral  SpO2:  94% 95% 95%  Weight: 88.5 kg     Height:        Intake/Output Summary (Last 24 hours) at 08/10/2024 1739 Last data filed at 08/10/2024 1435 Gross per 24 hour  Intake 960 ml  Output 3305 ml  Net -2345 ml   Filed Weights   08/08/24 0500 08/09/24 0457 08/10/24 0400  Weight: 93.5 kg 90.4 kg 88.5 kg    Examination:  General exam: Patient is obese.  Not in any distress.  Awake and alert.  HEENT: Patient is pale. Lungs: Clear to auscultation anteriorly. Abdomen: Obese, soft and nontender.   Extremities: 2 bilateral lower extremity edema  Data Reviewed:   CBC: Recent Labs  Lab 08/04/24 0251 08/05/24 0218  WBC 4.7 5.3  HGB 8.4* 7.9*  HCT 26.2* 24.9*  MCV 92.3 92.2  PLT 134* 112*   Basic Metabolic Panel: Recent Labs  Lab 08/07/24 0218 08/08/24 0233 08/09/24 0222 08/09/24 0224 08/10/24 0747  NA 136 135 134* 135 136  K 4.0 4.3 4.1 4.1 4.1  CL 96* 92* 95* 95* 97*  CO2 30 31 28 29 28   GLUCOSE 151* 191* 178* 175* 160*  BUN 38* 42* 47* 47* 44*  CREATININE 1.68* 1.56* 1.74* 1.70* 1.48*  CALCIUM  8.4* 8.7* 8.5* 8.6* 8.8*  MG 1.5*  --  1.5*  --   --   PHOS  --   --   --  4.6 4.4   GFR: Estimated Creatinine Clearance: 37.3 mL/min (A) (by C-G formula based on SCr of 1.48 mg/dL (H)). Liver Function Tests: Recent Labs  Lab 08/04/24 0251 08/09/24 0224 08/10/24 0747  AST 46*  --   --   ALT 25   --   --   ALKPHOS 162*  --   --   BILITOT 0.6  --   --   PROT 5.7*  --   --   ALBUMIN  2.5* 2.8* 3.1*   No results for input(s): LIPASE, AMYLASE in the last 168 hours. No results for input(s): AMMONIA in the last 168 hours. Coagulation Profile: No results for input(s): INR, PROTIME in the last 168 hours. Cardiac Enzymes: No results for input(s): CKTOTAL, CKMB, CKMBINDEX, TROPONINI in the last 168 hours. BNP (last 3 results) No results for input(s): PROBNP in the last 8760 hours. HbA1C: No results for input(s): HGBA1C in the last 72 hours. CBG: Recent Labs  Lab 08/09/24 1610 08/09/24 2047 08/10/24 0654 08/10/24 1125 08/10/24 1548  GLUCAP 138* 235* 141* 178* 237*   Lipid Profile: No results for input(s): CHOL, HDL, LDLCALC, TRIG, CHOLHDL, LDLDIRECT in the last 72 hours. Thyroid  Function Tests: No results for input(s): TSH, T4TOTAL, FREET4, T3FREE, THYROIDAB in the last 72 hours. Anemia Panel: No results for input(s): VITAMINB12, FOLATE,  FERRITIN, TIBC, IRON , RETICCTPCT in the last 72 hours. Urine analysis:    Component Value Date/Time   COLORURINE YELLOW 08/03/2024 1200   APPEARANCEUR CLEAR 08/03/2024 1200   LABSPEC 1.011 08/03/2024 1200   PHURINE 5.0 08/03/2024 1200   GLUCOSEU NEGATIVE 08/03/2024 1200   HGBUR NEGATIVE 08/03/2024 1200   BILIRUBINUR NEGATIVE 08/03/2024 1200   BILIRUBINUR negative 07/02/2024 1616   KETONESUR NEGATIVE 08/03/2024 1200   PROTEINUR NEGATIVE 08/03/2024 1200   UROBILINOGEN 0.2 07/02/2024 1616   UROBILINOGEN 0.2 01/31/2012 1504   NITRITE NEGATIVE 08/03/2024 1200   LEUKOCYTESUR NEGATIVE 08/03/2024 1200   Sepsis Labs: @LABRCNTIP (procalcitonin:4,lacticidven:4)  ) Recent Results (from the past 240 hours)  Resp panel by RT-PCR (RSV, Flu A&B, Covid) Anterior Nasal Swab     Status: None   Collection Time: 08/03/24 12:00 PM   Specimen: Anterior Nasal Swab  Result Value Ref Range Status    SARS Coronavirus 2 by RT PCR NEGATIVE NEGATIVE Final   Influenza A by PCR NEGATIVE NEGATIVE Final   Influenza B by PCR NEGATIVE NEGATIVE Final    Comment: (NOTE) The Xpert Xpress SARS-CoV-2/FLU/RSV plus assay is intended as an aid in the diagnosis of influenza from Nasopharyngeal swab specimens and should not be used as a sole basis for treatment. Nasal washings and aspirates are unacceptable for Xpert Xpress SARS-CoV-2/FLU/RSV testing.  Fact Sheet for Patients: bloggercourse.com  Fact Sheet for Healthcare Providers: seriousbroker.it  This test is not yet approved or cleared by the [place]  FDA and has been authorized for detection and/or diagnosis of SARS-CoV-2 by FDA under an Emergency Use Authorization (EUA). This EUA will remain in effect (meaning this test can be used) for the duration of the COVID-19 declaration under Section 564(b)(1) of the Act, 21 U.S.C. section 360bbb-3(b)(1), unless the authorization is terminated or revoked.     Resp Syncytial Virus by PCR NEGATIVE NEGATIVE Final    Comment: (NOTE) Fact Sheet for Patients: bloggercourse.com  Fact Sheet for Healthcare Providers: seriousbroker.it  This test is not yet approved or cleared by the [place]  FDA and has been authorized for detection and/or diagnosis of SARS-CoV-2 by FDA under an Emergency Use Authorization (EUA). This EUA will remain in effect (meaning this test can be used) for the duration of the COVID-19 declaration under Section 564(b)(1) of the Act, 21 U.S.C. section 360bbb-3(b)(1), unless the authorization is terminated or revoked.  Performed at [facility], [address] [place], [place] [address]      Radiology Studies: No results found.    Scheduled Meds:  carvedilol   6.25 mg Oral BID WC   ciprofloxacin   500 mg Oral BID   doxycycline   100 mg Oral BID   DULoxetine   60  mg Oral BID   enoxaparin  (LOVENOX ) injection  40 mg Subcutaneous Q24H   furosemide   60 mg Intravenous BID   insulin  aspart  0-15 Units Subcutaneous TID WC   insulin  glargine-yfgn  30 Units Subcutaneous QHS   mirtazapine   30 mg Oral QHS   pantoprazole   40 mg Oral Daily   pramipexole   1 mg Oral QHS   pregabalin   75 mg Oral TID   sodium chloride  flush  3 mL Intravenous Q12H   spironolactone  25 mg Oral Daily   Continuous Infusions:   LOS: 7 days    Time spent: 35 mins    Leatrice chapel, MD [facility]   08/10/2024, 5:39 PM

## 2024-08-10 NOTE — Plan of Care (Signed)

## 2024-08-10 NOTE — Progress Notes (Signed)
 Palliative:  HPI:  68 y.o. 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 Palliative consulted for goals of care in the setting of HFpEF, NASH cirrhosis, discitis osteomyelitis, shoulder abscess, restrictive lung disease and failure to thrive.   I met today again with Katelyn [name]. No family/visitors at bedside. Katelyn [name] shares with me her relationships with her parents and siblings as well as experience with them at end of life. She talks of becoming the rodell of her family when her mother died. She was very close with her parents. She talks of her siblings and relationships being able to make peace before death. Katelyn [name] is at peace with her own mortality but fears the unknown of what comes next.   We reviewed her goals of care and she confirms desire for therapies with outpatient palliative care and desire to transition to hospice in the future. She understands her poor prognosis. She is accepting of this and has a strong faith and looks forward to seeing her son in heaven. We reviewed MOST form and discussed the decisions on this form in detail. Katelyn [name] understands. I encouraged her to think and pray about her wishes and to discuss further with her husband as well. I shared that it would be good to complete this form prior to discharge.   Of note, Katelyn [name] also reports that she is able to tolerate CPAP better with oxygen  flow which she did not have at home and would like to have this at home as well.   All questions/concerns addressed.  Emotional support provided.   Exam: Alert, oriented. No distress. Sitting in recliner. Breathing regular, unlabored. Abd soft. Moves all extremities. BLE edema.   Plan: - DNR - MOST form provided for review - not yet completed - Hopeful to continue PT/OT after d/c and desires follow up with [facility] with likely desire for hospice in the near future  60 min  Bernarda Kitty, NP Palliative Medicine Team Pager [phone] (Please see [email or web address] for schedule) Team Phone [phone]

## 2024-08-11 ENCOUNTER — Other Ambulatory Visit (HOSPITAL_COMMUNITY): Payer: Self-pay

## 2024-08-11 DIAGNOSIS — J9621 Acute and chronic respiratory failure with hypoxia: Secondary | ICD-10-CM | POA: Diagnosis not present

## 2024-08-11 LAB — RENAL FUNCTION PANEL
Albumin: 3 g/dL — ABNORMAL LOW (ref 3.5–5.0)
Anion gap: 10 (ref 5–15)
BUN: 52 mg/dL — ABNORMAL HIGH (ref 8–23)
CO2: 31 mmol/L (ref 22–32)
Calcium: 8.8 mg/dL — ABNORMAL LOW (ref 8.9–10.3)
Chloride: 92 mmol/L — ABNORMAL LOW (ref 98–111)
Creatinine, Ser: 1.46 mg/dL — ABNORMAL HIGH (ref 0.44–1.00)
GFR, Estimated: 39 mL/min — ABNORMAL LOW (ref >60)
Glucose, Bld: 181 mg/dL — ABNORMAL HIGH (ref 70–99)
Phosphorus: 4.3 mg/dL (ref 2.5–4.6)
Potassium: 3.9 mmol/L (ref 3.5–5.1)
Sodium: 133 mmol/L — ABNORMAL LOW (ref 135–145)

## 2024-08-11 LAB — GLUCOSE, CAPILLARY: Glucose-Capillary: 169 mg/dL — ABNORMAL HIGH (ref 70–99)

## 2024-08-11 LAB — MAGNESIUM: Magnesium: 2.5 mg/dL — ABNORMAL HIGH (ref 1.7–2.4)

## 2024-08-11 MED ORDER — TRESIBA FLEXTOUCH 200 UNIT/ML ~~LOC~~ SOPN: 40.0000 [IU] | PEN_INJECTOR | Freq: Every day | SUBCUTANEOUS | Status: AC

## 2024-08-11 MED ORDER — SPIRONOLACTONE 25 MG PO TABS
25.0000 mg | ORAL_TABLET | Freq: Every day | ORAL | 1 refills | Status: DC
  Filled 2024-08-11: qty 30, 30d supply, fill #0

## 2024-08-11 MED ORDER — TORSEMIDE 20 MG PO TABS
40.0000 mg | ORAL_TABLET | Freq: Two times a day (BID) | ORAL | 1 refills | Status: AC
  Filled 2024-08-11: qty 120, 30d supply, fill #0

## 2024-08-11 NOTE — Plan of Care (Signed)

## 2024-08-11 NOTE — Progress Notes (Signed)
 Mobility Specialist Progress Note:    08/11/24 1137  Mobility  Activity Ambulated with assistance  Level of Assistance Contact guard assist, steadying assist  Assistive Device Other (Comment) (Hemi)  Distance Ambulated (ft) 15 ft  RUE Weight Bearing Per Provider Order NWB  Activity Response Tolerated well  Mobility Referral Yes  Mobility visit 1 Mobility  Mobility Specialist Start Time (ACUTE ONLY) 1137  Mobility Specialist Stop Time (ACUTE ONLY) 1149  Mobility Specialist Time Calculation (min) (ACUTE ONLY) 12 min   Received pt in room awaiting D/C agreeable to session. No c/o any symptoms. Pt moving and ambulating decently. Returned pt to recliner w/ all needs met.   Venetia Keel Mobility Specialist Please Neurosurgeon or Rehab Office at [phone]

## 2024-08-11 NOTE — Discharge Summary (Signed)
 Physician Discharge Summary  Patient ID: Katelyn [name] MRN: [id number] DOB/AGE: 67-Jun-1957 67 y.o.  Admit date: 08/03/2024 Discharge date: 08/11/2024  Admission Diagnoses:  Discharge Diagnoses:  Principal Problem:   Acute on chronic respiratory failure with hypoxia (HCC) Active Problems:   Recurrent major depression in remission   Restless leg   GERD (gastroesophageal reflux disease)   Hyperlipidemia   Iron  deficiency anemia due to chronic blood loss   OSA (obstructive sleep apnea)   Chronic diastolic CHF (congestive heart failure) (HCC)   DM2 (diabetes mellitus, type 2) (HCC)   Restless leg syndrome   Neuropathy   Essential hypertension   CKD stage 3b, GFR 30-44 ml/min (HCC)   CAD (coronary artery disease)   Chronic hypoxemic respiratory failure (HCC)   Type 2 diabetes mellitus with diabetic neuropathy, unspecified (HCC)   Hypertensive heart disease with congestive heart failure and chronic kidney disease (HCC)   Peripheral artery disease   GAD (generalized anxiety disorder)   Obesity (BMI 30-39.9)   Anemia of chronic disease   Dyslipidemia   Status post insertion of spinal cord stimulator   Thoracic spondylosis with myelopathy   Chronic pain syndrome   Hepatic cirrhosis (HCC)   Migraine without aura and responsive to treatment   Acute on chronic diastolic CHF (congestive heart failure) (HCC)   Acute heart failure with preserved ejection fraction (HFpEF) (HCC)   Discharged Condition: stable  Hospital Course: Patient is a 69 year old female past medical history significant for diastolic CHF, restrictive lung disease, NASH/cirrhosis, hypertension, diabetes, CAD, PAD, nonsustained V. tach, migraines, depression, RLS, thoracic and lumbar spine disease history of spinal cord stimulator, fusion, numerous surgeries, history of discitis with hardware removal.  Patient was admitted with acute on chronic diastolic CHF.  Acute on chronic respiratory failure with hypoxia Acute on  chronic diastolic CHF - Patient presented with dyspnea on exertion, and respiratory distress in the ED, placed on BiPAP -Last echo done on 07/03/2024 revealed EF 60-65%, indeterminate diastolic function, normal RV function. -Patient was managed with IV Lasix  60 mg twice daily. - Weaned off BiPAP - Poor candidate for SGLT2i - Palliative care team was consulted during the hospital stay.  s.     Hypomagnesemia - Resolved.   History of discitis status post hardware removal Recent shoulder abscess - Continue home ciprofloxacin  and doxycycline  -Significant debility,  follow-up with orthopedics - Has essentially become bedbound since December   Hypertension - Diuretics and Coreg  as above -Blood pressure is controlled.   Hyperlipidemia CAD PAD - Not on antilipid medication - Continue home Coreg    Diabetes > 40 units qhs at home and mealtime insulin  - Labile, continue glargine and meal coverage   Neuropathy - Continue home Lyrica    NASH cirrhosis -Diuretics as noted above, albumin  is 2.5   Depression Anxiety - Continue home duloxetine  and Remeron    RLS - Continue home pramipexole    Restrictive lung disease Obesity OSA on CPAP - Continue CPAP   T-spine and L-spine disease Chronic pain -History of fusion , sp hardware removal due to infection as above - Continue home oxycodone     Diabetes mellitus type 2 with hyperglycemia: - Continue CBG and sliding scale insulin  coverage. - Continue subcutaneous Semglee  30 units nightly.   Goals: 68/F with chronic diastolic CHF, NASH/cirrhosis, numerous back surgeries, discitis/osteomyelitis, shoulder abscess, restrictive lung disease, with ongoing failure to thrive, requested palliative consult for goals of care> family meeting completed yesterday, she is considering getting hospice involved at discharge    Consults: Palliative  care medicine.   Significant Diagnostic Studies:  Chest x-ray revealed: 1. Congestive heart failure  with cardiomegaly, pulmonary edema and small pleural effusions. 2. 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 The possibility of septic arthritis is again raised.  Cardiac BNP of 193. Troponin of 30-36.   Discharge Exam: Blood pressure 126/60, pulse 84, temperature 97.8 F (36.6 C), temperature source Oral, resp. rate 13, height 5' 1 (1.549 m), weight 87.8 kg, SpO2 96%.   Disposition: Discharge disposition: 01-Home or Self Care       Discharge Instructions     Diet - low sodium heart healthy   Complete by: As directed    Diet Carb Modified   Complete by: As directed    Increase activity slowly   Complete by: As directed       Allergies as of 08/11/2024       Reactions   Atorvastatin Calcium     Other Reaction(s): Not available atorvastatin calcium    Oxycodone  Other (See Comments)   Confusion, staring episodes States she is not allergic.    Atorvastatin Nausea And Vomiting, Other (See Comments)   MYALGIAS   Pentazocine Other (See Comments)   headache Other Reaction(s): Not available pentazocine   Cefepime  Other (See Comments)   9/29 - had confusion again with retrial of cefepime   Presumed AMS/neurotoxicity in the setting of AKI cefepime    Gabapentin  Swelling   Other Nausea Only   UNSPECIFIED Anesthesia        Medication List     STOP taking these medications    aspirin  EC 81 MG tablet   diclofenac  Sodium 1 % Gel Commonly known as: VOLTAREN    Hair Skin and Nails Formula Tabs   methocarbamol  500 MG tablet Commonly known as: ROBAXIN    mirabegron  ER 25 MG Tb24 tablet Commonly known as: MYRBETRIQ    NERVIVE ROLL-ON EX       TAKE these medications    carvedilol  6.25 MG tablet Commonly known as: COREG  Take 1 tablet (6.25 mg total) by mouth 2 (two) times daily with a meal.   ciprofloxacin  500 MG tablet Commonly known as: CIPRO  Take 1 tablet  (500 mg total) by mouth 2 (two) times daily.   doxycycline  100 MG tablet Commonly known as: VIBRA -TABS Take 1 tablet (100 mg total) by mouth 2 (two) times daily.   DULoxetine  60 MG capsule Commonly known as: CYMBALTA  Take 1 capsule (60 mg total) by mouth 2 (two) times daily.   melatonin 5 MG Tabs Take 1 tablet (5 mg total) by mouth at bedtime as needed.   mirtazapine  30 MG tablet Commonly known as: REMERON  Take 1 tablet (30 mg total) by mouth at bedtime.   nitroGLYCERIN  0.4 MG SL tablet Commonly known as: NITROSTAT  Place 1 tablet (0.4 mg total) under the tongue every 5 (five) minutes as needed for chest pain.   NovoLOG  ReliOn 100 UNIT/ML injection Generic drug: insulin  aspart Inject 2-20 Units into the skin 3 (three) times daily with meals.   oxyCODONE  5 MG immediate release tablet Commonly known as: Oxy IR/ROXICODONE  Take 1 tablet (5 mg total) by mouth every 6 (six) hours as needed for moderate pain (pain score 4-6).   pantoprazole  40 MG tablet Commonly known as: PROTONIX  Take 1 tablet by mouth once daily   polyethylene glycol powder 17 GM/SCOOP powder Commonly known as: GLYCOLAX /MIRALAX  Take 17 g by mouth daily. What changed:  when to take this reasons to take  this   pramipexole  1 MG tablet Commonly known as: MIRAPEX  Take 1 mg by mouth at bedtime.   pregabalin  75 MG capsule Commonly known as: LYRICA  Take 1 capsule (75 mg total) by mouth 3 (three) times daily.   spironolactone 25 MG tablet Commonly known as: ALDACTONE Take 1 tablet (25 mg total) by mouth daily. Start taking on: August 12, 2024   tirzepatide 5 MG/0.5ML Pen Commonly known as: MOUNJARO Inject 5 mg into the skin once a week. Increase to 7mg  weekly on 08/11/24.   torsemide  20 MG tablet Commonly known as: DEMADEX  Take 2 tablets (40 mg total) by mouth 2 (two) times daily. What changed:  how much to take when to take this   Tresiba  FlexTouch 200 UNIT/ML FlexTouch Pen Generic drug: insulin   degludec Inject 40 Units into the skin at bedtime.               Durable Medical Equipment  (From admission, onward)           Start     Ordered   08/06/24 1212  For home use only DME Walker hemi  Once       Question:  Patient needs a walker to treat with the following condition  Answer:  Weakness   08/06/24 1212            Follow-up Information     Ofilia Lamar CROME, MD Follow up on 08/14/2024.   Specialty: Family Medicine Why: 2pm for hospital follow up Contact information: [address] [place] [address] [phone]         [facility] Follow up.   Specialty: PALLIATIVE CARE Why: outpatient palliative services. Contact information: [address] [place]  [address] [phone]        [facility] Follow up on 08/11/2024.   Why: apt was scheduled prior to admission at 1 pm Contact information: [address] [place] [facility] [place] [address] [phone]         [facility] (DME) Follow up.   Specialty: DME Services Why: hemi walker Contact information: [address] [employer] [place]  [address] [phone]        [facility] Follow up in 1 week(s).   Contact information: [address] [place]  [address] [phone]               Time spent: 35 minutes.  SignedBETHA Leatrice LILLETTE Rosario 08/11/2024, 10:33 AM

## 2024-08-11 NOTE — TOC Transition Note (Addendum)
 Transition of Care [facility]) - Discharge Note   Patient Details  Name: Katelyn [name] MRN: [id number] Date of Birth: 01/26/1956  Transition of Care [facility]) CM/SW Contact:  Waddell Barnie Rama, RN Phone Number: 08/11/2024, 11:08 AM   Clinical Narrative:    For dc today, she [facility], and she wants to continue with outpatient therapy with [facility].  The Hemi walker is in the room at the bedside. Spouse is at the bedside to transport home.     Barriers to Discharge: Continued Medical Work up   Patient Goals and CMS Choice Patient states their goals for this hospitalization and ongoing recovery are:: return home CMS Medicare.gov Compare Post Acute Care list provided to:: Patient Choice offered to / list presented to : Patient      Discharge Placement                       Discharge Plan and Services Additional resources added to the After Visit Summary for   In-house Referral: Hospice / Palliative Care Discharge Planning Services: CM Consult Post Acute Care Choice: Durable Medical Equipment (hemi walker)          DME Arranged: [facility] hemi                    Social Drivers of Health (SDOH) Interventions SDOH Screenings   Food Insecurity: No Food Insecurity (08/04/2024)  Housing: Low Risk  (08/04/2024)  Transportation Needs: No Transportation Needs (08/04/2024)  Utilities: Not At Risk (08/04/2024)  Depression (PHQ2-9): Low Risk  (05/07/2024)  Social Connections: Moderately Isolated (08/04/2024)  Tobacco Use: Low Risk  (08/03/2024)     Readmission Risk Interventions    08/06/2024   11:32 AM  Readmission Risk Prevention Plan  Transportation Screening Complete  Medication Review (RN Care Manager) Complete  PCP or Specialist appointment within 3-5 days of discharge Complete  HRI or Home Care Consult Complete  Palliative Care Screening Not Applicable  Skilled Nursing Facility Not Applicable

## 2024-08-11 NOTE — Progress Notes (Signed)
 Discharge   Patient expressed verbal understanding of discharge POC.   Patient given time to ask any questions.  Spouse at bedside.  Alert oriented in good spirits.   Tele and PIV removed by Primary RN.  Pressure dressings intact.  All belonging ans RW with patient.  TOC meds delivered at bedside and given to Spouse.  Transporting to Main [facility].

## 2024-08-13 DIAGNOSIS — M87019 Idiopathic aseptic necrosis of unspecified shoulder: Secondary | ICD-10-CM | POA: Insufficient documentation

## 2024-08-19 NOTE — Telephone Encounter (Signed)
 OK with me but all PT/rehab orders from NS or Ortho

## 2024-08-25 DIAGNOSIS — N183 Chronic kidney disease, stage 3 unspecified: Secondary | ICD-10-CM | POA: Insufficient documentation

## 2024-08-25 DIAGNOSIS — K7460 Unspecified cirrhosis of liver: Secondary | ICD-10-CM | POA: Insufficient documentation

## 2024-08-25 DIAGNOSIS — F419 Anxiety disorder, unspecified: Secondary | ICD-10-CM | POA: Insufficient documentation

## 2024-08-25 NOTE — Progress Notes (Unsigned)
 Cardiology Office Note:    Date:  08/26/2024   ID:  Katelyn [name], DOB Sep 14, 1956, MRN [id number]  PCP:  Ofilia Lamar CROME, MD  Cardiologist:  Redell Leiter, MD    Referring MD: Ofilia Lamar CROME, MD    ASSESSMENT:    1. Heart failure with mildly reduced ejection fraction (HFmrEF) (HCC)   2. Stage 3b chronic kidney disease (HCC)   3. Anemia, unspecified type    PLAN:    In order of problems listed above:  Very complicated case I think the predominant problem here is fluid accumulation syndrome from being in hospital associated with severe anemia hemoglobin of less than 8. Current diuretics are working well recheck labs including BMP and decide if she needs to continue this intense fluid restriction Recheck her kidney function The 1 aspect of her care that I think we can make a positive impact is to correct anemia check iron  studies and if anemic refer for iron  infusion No other specific cardiac changes Continue her current torsemide  40 mg twice daily along with spironolactone and she will try to discontinue Lyrica  if possible causes intense sodium retention and continue her current carvedilol    Next appointment: 3 months   Medication Adjustments/Labs and Tests Ordered: Current medicines are reviewed at length with the patient today.  Concerns regarding medicines are outlined above.  No orders of the defined types were placed in this encounter.  No orders of the defined types were placed in this encounter.    History of Present Illness:    Katelyn [name] is a 68 y.o. female with a hx of mild mild nonobstructive CAD left heart catheterization 2021 left bundle branch block hypertensive heart disease or chronic diastolic heart failure obstructive sleep apnea restrictive lung disease diabetes mellitus stage III CKD and hyper lipidemia last seen by Delon Hoover nurse practitioner 05/21/2024.  Most recent ejection fraction June 2000 2545 to 50%.  She has had 2 recent admissions to the  hospital with a chronic infection and worsened edema.  Hospice care was being considered.  Chart review shows her last hemoglobin was 7.9 2 weeks ago and a recent BMP with a creatinine of 1.46 GFR 39 cc/min potassium 3.9 sodium 133.  Compliance with diet, lifestyle and medications: Yes  Is really nice to see Baylee doing does well if she is she is surprised me She is quite weak fatigued and has an intense ice pica no doubt my mind she is anemic and iron  deficient I am going to check a CBC as well as iron  studies and if confirmed refer to hematology for iron  infusion Her weight at home has vacillated she is down about 5 pounds back on her diuretics and is not having orthopnea or shortness of breath her edema is diminished I would best characterize her status post fluid accumulation syndrome from being in the hospital more than decompensated heart failure No chest pain palpitation or syncope Past Medical History:  Diagnosis Date   ACS (acute coronary syndrome) (HCC) 04/06/2023   Acute heart failure with preserved ejection fraction (HFpEF) (HCC) 08/04/2024   Acute on chronic diastolic CHF (congestive heart failure) (HCC) 08/03/2024   Acute on chronic respiratory failure with hypoxia (HCC) 08/03/2024   AKI (acute kidney injury) 02/20/2023   Anxiety    Arthritis    Bursitis of right hip    CAD (coronary artery disease)    Cardiac catheterization June 2014 in [place] - 50% circumflex stenosis   Chest pain, neg MI,  stable CAD non obstructive on cath 10/05/20 10/04/2020   Chronic diastolic CHF (congestive heart failure) (HCC) 08/20/2017   Chronic diastolic heart failure (HCC) 08/20/2017   Chronic hypoxemic respiratory failure (HCC) 10/07/2020   Chronic kidney disease, stage 3 (HCC)    does not see nephrologist   Chronic low back pain without sciatica 03/14/2016   Cirrhosis of liver (HCC)    CKD (chronic kidney disease), stage III (HCC) 10/07/2020   Complication of anesthesia    Cough  04/19/2017   Overview:  Last Assessment & Plan:  Formatting of this note may be different from the original. Cough - ? ACE related with AR triggers   Plan  Patient Instructions  Discuss with your primary doctor that lisinopril  pain, need making your cough worse. May use Mucinex  DM twice daily as needed for cough and congestion Zyrtec 10 mg at bedtime as needed for drainage Saline nasal spray as needed. Lab tests today Activity as tolerated. Follow with Dr. Shellia in 3-4 months and As needed   Please contact office for sooner follow up if symptoms do not improve or worsen or seek emergency care    COVID-19 virus infection 06/21/2021   Formatting of this note might be different from the original.  06/21/2021: paxlovid     Depression    Dyspnea    with exertion    lazy lung - per  Dr Shellia from back issues- 06/2016   Elevated liver enzymes 12/05/2016   Essential hypertension    GERD (gastroesophageal reflux disease)    Gout 03/14/2016   Greater trochanteric bursitis of right hip 02/02/2012   H/O hiatal hernia    Heart murmur    History of blood transfusion 2016   History of esophageal stricture 10/07/2020   History of kidney stones    Hypercholesterolemia    Hypertensive heart disease with congestive heart failure and chronic kidney disease (HCC) 11/25/2019   Hypertensive heart disease with heart failure (HCC) 01/01/2017   Hypoxia 10/07/2020   Iliotibial band syndrome of right side 02/02/2012   Iron  deficiency anemia due to chronic blood loss 03/14/2016   LBBB (left bundle branch block) 01/01/2017   Left bundle branch block    Leg weakness 10/07/2020   Lumbar stenosis    Meralgia paraesthetica 12/05/2016   Migraine without aura and responsive to treatment 04/08/2019   Mild CAD 11/24/2015   Morbid obesity (HCC) 10/07/2020   Morbid obesity with BMI of 40.0-44.9, adult (HCC)    Neuropathy    OSA (obstructive sleep apnea) 05/24/2016   Overview:  Managed PULM- no CPAP   Peripheral artery  disease 07/11/2021   PONV (postoperative nausea and vomiting)    no N/V with patch   Pulmonary hypertension (HCC) 10/06/2020   Formatting of this note might be different from the original.  10/06/2020 : cath PAP 27 with RAP 16     Restless leg syndrome    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   Overview:   2017: dx     S/P lumbar laminectomy 11/26/2015   S/P lumbar spinal fusion 08/29/2016   Serratia 02/13/2023   SIRS (systemic inflammatory response syndrome) (HCC) 04/06/2023   Sleep apnea    Tinnitus 12/05/2016   Type 2 diabetes mellitus (HCC)    UTI (urinary tract infection) 10/07/2020   Ventricular tachycardia, nonsustained (HCC) 04/06/2023    Current Medications: Current Meds  Medication Sig   carvedilol  (COREG ) 6.25 MG tablet Take 1 tablet (6.25 mg total) by mouth 2 (two) times daily with  a meal.   ciprofloxacin  (CIPRO ) 500 MG tablet Take 1 tablet (500 mg total) by mouth 2 (two) times daily.   doxycycline  (VIBRA -TABS) 100 MG tablet Take 1 tablet (100 mg total) by mouth 2 (two) times daily.   DULoxetine  (CYMBALTA ) 60 MG capsule Take 1 capsule (60 mg total) by mouth 2 (two) times daily.   melatonin 5 MG TABS Take 1 tablet (5 mg total) by mouth at bedtime as needed.   mirtazapine  (REMERON ) 30 MG tablet Take 1 tablet (30 mg total) by mouth at bedtime.   nitroGLYCERIN  (NITROSTAT ) 0.4 MG SL tablet Place 1 tablet (0.4 mg total) under the tongue every 5 (five) minutes as needed for chest pain.   NOVOLOG  RELION 100 UNIT/ML injection Inject 2-20 Units into the skin 3 (three) times daily with meals.   oxyCODONE  (OXY IR/ROXICODONE ) 5 MG immediate release tablet Take 1 tablet (5 mg total) by mouth every 6 (six) hours as needed for moderate pain (pain score 4-6).   pantoprazole  (PROTONIX ) 40 MG tablet Take 1 tablet by mouth once daily   polyethylene glycol powder (GLYCOLAX /MIRALAX ) 17 GM/SCOOP powder Take 17 g by mouth daily. (Patient taking differently: Take 17 g by mouth daily as needed for  moderate constipation.)   pramipexole  (MIRAPEX ) 1 MG tablet Take 1 mg by mouth at bedtime.   pregabalin  (LYRICA ) 75 MG capsule Take 1 capsule (75 mg total) by mouth 3 (three) times daily.   spironolactone (ALDACTONE) 25 MG tablet Take 1 tablet (25 mg total) by mouth daily.   tirzepatide (MOUNJARO) 5 MG/0.5ML Pen Inject 5 mg into the skin once a week. Increase to 7mg  weekly on 08/11/24.   torsemide  (DEMADEX ) 20 MG tablet Take 2 tablets (40 mg total) by mouth 2 (two) times daily.   TRESIBA  FLEXTOUCH 200 UNIT/ML FlexTouch Pen Inject 40 Units into the skin at bedtime.      EKGs/Labs/Other Studies Reviewed:    The following studies were reviewed today:  Cardiac Studies & Procedures   ______________________________________________________________________________________________ CARDIAC CATHETERIZATION  CARDIAC CATHETERIZATION 10/05/2020  Conclusion  Prox LAD lesion is 30% stenosed.  1st Diag lesion is 55% stenosed.  Dist Cx lesion is 40% stenosed.  Prox RCA lesion is 25% stenosed.  Dist RCA lesion is 25% stenosed.  There is mild left ventricular systolic dysfunction.  LV end diastolic pressure is moderately elevated.  The left ventricular ejection fraction is 50-55% by visual estimate.  Hemodynamic findings consistent with mild pulmonary hypertension.  1. Nonobstructive CAD. Codominant circulation 2. Low normal LV systolic function. EF estimated at 50%. 3. Elevated LV filling pressures - PCWP 22 mm Hg, EDP 25 mm Hg 4. Mild pulmonary HTN with mean PAP 27 mm Hg 5. Elevated RA pressures 16 mm Hg 6. Reduced cardiac output with index 2.5  Plan: medical management for CAD. Need to repeat Echo to assess LV and RV function. May need to consider pulmonary embolic disease given pulmonary HTN and elevated RA pressures. This may reflect elevated Left heart pressures. Morbid obesity and OSA may also be contributing.  Findings Coronary Findings Diagnostic  Dominance: Co-dominant  Left  Main Vessel was injected. Vessel is normal in caliber. Vessel is angiographically normal.  Left Anterior Descending Prox LAD lesion is 30% stenosed.  First Diagonal Branch 1st Diag lesion is 55% stenosed.  Left Circumflex Dist Cx lesion is 40% stenosed.  Right Coronary Artery Prox RCA lesion is 25% stenosed. Dist RCA lesion is 25% stenosed.  Intervention  No interventions have been documented.  STRESS TESTS  MYOCARDIAL PERFUSION IMAGING 04/09/2019  Interpretation Summary  The left ventricular ejection fraction is normal (55-65%).  Nuclear stress EF: 56%.  There was no ST segment deviation noted during stress.  The study is normal.  This is a low risk study.   ECHOCARDIOGRAM  ECHOCARDIOGRAM LIMITED 07/04/2024  Narrative ECHOCARDIOGRAM LIMITED REPORT    Patient Name:   KAOIR LOREE Morawski Date of Exam: 07/04/2024 Medical Rec #:  [id number]   Height:       62.0 in Accession #:    [id number]  Weight:       200.0 lb Date of Birth:  1955-12-22   BSA:          1.912 m Patient Age:    68 years    BP:           123/47 mmHg Patient Gender: F           HR:           99 bpm. Exam Location:  Inpatient  Procedure: Limited Echo, Limited Color Doppler and Cardiac Doppler (Both Spectral and Color Flow Doppler were utilized during procedure).  Indications:    dyspnea  History:        Patient has prior history of Echocardiogram examinations, most recent 04/04/2024. CHF, CAD, chronic kidney disease, Arrythmias:LBBB, Signs/Symptoms:Edema and Murmur; Risk Factors:Dyslipidemia, Sleep Apnea and Diabetes.  Sonographer:    Tinnie Barefoot RDCS Referring Phys: [phone] DAVID GIRGUIS  IMPRESSIONS   1. Left ventricular ejection fraction, by estimation, is 60 to 65%. The left ventricle has normal function. The left ventricle has no regional wall motion abnormalities. There is mild left ventricular hypertrophy. 2. Right ventricular systolic function is normal. The right ventricular size is  normal. 3. The mitral valve is normal in structure. No evidence of mitral valve regurgitation. No evidence of mitral stenosis. 4. The aortic valve is calcified. There is mild calcification of the aortic valve. Aortic valve regurgitation is not visualized. Aortic valve sclerosis is present, with no evidence of aortic valve stenosis. 5. The inferior vena cava is normal in size with greater than 50% respiratory variability, suggesting right atrial pressure of 3 mmHg.  FINDINGS Left Ventricle: Left ventricular ejection fraction, by estimation, is 60 to 65%. The left ventricle has normal function. The left ventricle has no regional wall motion abnormalities. The left ventricular internal cavity size was normal in size. There is mild left ventricular hypertrophy.  Right Ventricle: The right ventricular size is normal. No increase in right ventricular wall thickness. Right ventricular systolic function is normal.  Left Atrium: Left atrial size was normal in size.  Right Atrium: Right atrial size was normal in size.  Pericardium: There is no evidence of pericardial effusion.  Mitral Valve: The mitral valve is normal in structure. No evidence of mitral valve stenosis.  Tricuspid Valve: The tricuspid valve is normal in structure. Tricuspid valve regurgitation is not demonstrated. No evidence of tricuspid stenosis.  Aortic Valve: The aortic valve is calcified. There is mild calcification of the aortic valve. Aortic valve regurgitation is not visualized. Aortic valve sclerosis is present, with no evidence of aortic valve stenosis.  Pulmonic Valve: The pulmonic valve was normal in structure. Pulmonic valve regurgitation is not visualized. No evidence of pulmonic stenosis.  Aorta: The aortic root is normal in size and structure.  Venous: The inferior vena cava is normal in size with greater than 50% respiratory variability, suggesting right atrial pressure of 3 mmHg.  IAS/Shunts: No atrial level shunt  detected by color flow Doppler.  Additional Comments: Spectral Doppler performed. Color Doppler performed.  LEFT VENTRICLE PLAX 2D LVIDd:         4.60 cm LVIDs:         3.60 cm LV PW:         1.20 cm LV IVS:        1.20 cm  LV Volumes (MOD) LV vol d, MOD A2C: 71.6 ml LV vol s, MOD A2C: 41.5 ml LV SV MOD A2C:     30.1 ml  IVC IVC diam: 1.80 cm  LEFT ATRIUM         Index LA diam:    4.80 cm 2.51 cm/m AORTIC VALVE LVOT Vmax:   120.00 cm/s LVOT Vmean:  75.000 cm/s LVOT VTI:    0.192 m   SHUNTS Systemic VTI: 0.19 m  Oneil Parchment MD Electronically signed by Oneil Parchment MD Signature Date/Time: 07/04/2024/3:06:25 PM    Final          ______________________________________________________________________________________________          Recent Labs: 08/03/2024: B Natriuretic Peptide 193.9 08/04/2024: ALT 25 08/05/2024: Hemoglobin 7.9; Platelets 112 08/11/2024: BUN 52; Creatinine, Ser 1.46; Magnesium  2.5; Potassium 3.9; Sodium 133  Recent Lipid Panel    Component Value Date/Time   CHOL 105 05/21/2024 1213   TRIG 49 05/21/2024 1213   HDL 62 05/21/2024 1213   CHOLHDL 1.7 05/21/2024 1213   CHOLHDL 4.2 10/05/2020 0228   VLDL 26 10/05/2020 0228   LDLCALC 31 05/21/2024 1213    Physical Exam:    VS:  BP 124/74   Pulse 88   Ht 5' 1 (1.549 m)   Wt 196 lb 12.8 oz (89.3 kg)   SpO2 99%   BMI 37.19 kg/m     Wt Readings from Last 3 Encounters:  08/26/24 196 lb 12.8 oz (89.3 kg)  08/11/24 193 lb 9.6 oz (87.8 kg)  07/05/24 207 lb 3.7 oz (94 kg)     GEN: Quite pale looks chronically ill well nourished, well developed in no acute distress HEENT: Normal NECK: No JVD; No carotid bruits LYMPHATICS: No lymphadenopathy CARDIAC: RRR, no murmurs, rubs, gallops RESPIRATORY:  Clear to auscultation without rales, wheezing or rhonchi  ABDOMEN: Soft, non-tender, non-distended MUSCULOSKELETAL: Not severe bilateral lower extremity edema; No deformity  SKIN: Warm and  dry NEUROLOGIC:  Alert and oriented x 3 PSYCHIATRIC:  Normal affect    Signed, Redell Leiter, MD  08/26/2024 10:29 AM    [facility]

## 2024-08-26 ENCOUNTER — Ambulatory Visit: Attending: Cardiology | Admitting: Cardiology

## 2024-08-26 ENCOUNTER — Encounter: Payer: Self-pay | Admitting: Cardiology

## 2024-08-26 VITALS — BP 124/74 | HR 88 | Ht 61.0 in | Wt 196.8 lb

## 2024-08-26 DIAGNOSIS — N1832 Chronic kidney disease, stage 3b: Secondary | ICD-10-CM | POA: Diagnosis not present

## 2024-08-26 DIAGNOSIS — I5020 Unspecified systolic (congestive) heart failure: Secondary | ICD-10-CM

## 2024-08-26 DIAGNOSIS — D649 Anemia, unspecified: Secondary | ICD-10-CM

## 2024-08-26 NOTE — Addendum Note (Signed)
 Addended by: SHERRE ADE I on: 08/26/2024 10:52 AM   Modules accepted: Orders

## 2024-08-26 NOTE — Patient Instructions (Signed)
 Medication Instructions:  Your physician has recommended you make the following change in your medication:   STOP: Lyrica   *If you need a refill on your cardiac medications before your next appointment, please call your pharmacy*  Lab Work: Your physician recommends that you return for lab work in:   Labs today: Ferritin Iron  TIBC, BMP, Pro BNP, CBC  If you have labs (blood work) drawn today and your tests are completely normal, you will receive your results only by: MyChart Message (if you have MyChart) OR A paper copy in the mail If you have any lab test that is abnormal or we need to change your treatment, we will call you to review the results.  Testing/Procedures: None  Follow-Up: At [facility], you and your health needs are our priority.  As part of our continuing mission to provide you with exceptional heart care, our providers are all part of one team.  This team includes your primary Cardiologist (physician) and Advanced Practice Providers or APPs (Physician Assistants and Nurse Practitioners) who all work together to provide you with the care you need, when you need it.  Your next appointment:   3 month(s)  Provider:   Redell Leiter, MD    We recommend signing up for the patient portal called MyChart.  Sign up information is provided on this After Visit Summary.  MyChart is used to connect with patients for Virtual Visits (Telemedicine).  Patients are able to view lab/test results, encounter notes, upcoming appointments, etc.  Non-urgent messages can be sent to your provider as well.   To learn more about what you can do with MyChart, go to [email or web address].   Other Instructions None

## 2024-08-27 ENCOUNTER — Ambulatory Visit: Payer: Self-pay | Admitting: Cardiology

## 2024-08-27 LAB — BASIC METABOLIC PANEL WITH GFR
BUN/Creatinine Ratio: 32 — ABNORMAL HIGH (ref 12–28)
BUN: 52 mg/dL — ABNORMAL HIGH (ref 8–27)
CO2: 26 mmol/L (ref 20–29)
Calcium: 9.2 mg/dL (ref 8.7–10.3)
Chloride: 99 mmol/L (ref 96–106)
Creatinine, Ser: 1.64 mg/dL — ABNORMAL HIGH (ref 0.57–1.00)
Glucose: 165 mg/dL — ABNORMAL HIGH (ref 70–99)
Potassium: 4.4 mmol/L (ref 3.5–5.2)
Sodium: 143 mmol/L (ref 134–144)
eGFR: 34 mL/min/1.73 — ABNORMAL LOW (ref >59)

## 2024-08-27 LAB — IRON,TIBC AND FERRITIN PANEL
Ferritin: 60 ng/mL (ref 15–150)
Iron Saturation: 13 % — ABNORMAL LOW (ref 15–55)
Iron: 44 ug/dL (ref 27–139)
Total Iron Binding Capacity: 332 ug/dL (ref 250–450)
UIBC: 288 ug/dL (ref 118–369)

## 2024-08-27 LAB — CBC
Hematocrit: 34.1 % (ref 34.0–46.6)
Hemoglobin: 10.8 g/dL — ABNORMAL LOW (ref 11.1–15.9)
MCH: 28.3 pg (ref 26.6–33.0)
MCHC: 31.7 g/dL (ref 31.5–35.7)
MCV: 90 fL (ref 79–97)
Platelets: 171 x10E3/uL (ref 150–450)
RBC: 3.81 x10E6/uL (ref 3.77–5.28)
RDW: 14.2 % (ref 11.7–15.4)
WBC: 5.8 x10E3/uL (ref 3.4–10.8)

## 2024-08-27 LAB — PRO B NATRIURETIC PEPTIDE: NT-Pro BNP: 339 pg/mL — ABNORMAL HIGH (ref 0–301)

## 2024-08-28 ENCOUNTER — Other Ambulatory Visit: Payer: Self-pay

## 2024-08-28 DIAGNOSIS — E611 Iron deficiency: Secondary | ICD-10-CM

## 2024-08-28 DIAGNOSIS — D649 Anemia, unspecified: Secondary | ICD-10-CM

## 2024-08-29 ENCOUNTER — Other Ambulatory Visit: Payer: Self-pay | Admitting: Hematology and Oncology

## 2024-08-29 DIAGNOSIS — D649 Anemia, unspecified: Secondary | ICD-10-CM

## 2024-09-01 ENCOUNTER — Telehealth: Payer: Self-pay | Admitting: Cardiology

## 2024-09-01 ENCOUNTER — Ambulatory Visit

## 2024-09-01 MED ORDER — SPIRONOLACTONE 25 MG PO TABS: 25.0000 mg | ORAL_TABLET | Freq: Every day | ORAL | 3 refills | Status: AC

## 2024-09-01 NOTE — Telephone Encounter (Signed)
*  STAT* If patient is at the pharmacy, call can be transferred to refill team.   1. Which medications need to be refilled? (please list name of each medication and dose if known)   spironolactone  (ALDACTONE ) 25 MG tablet     2. Would you like to learn more about the convenience, safety, & potential cost savings by using the [facility]? no   3. Are you open to using the [facility] (Type [facility]. no ).   4. Which pharmacy/location (including street and city if local pharmacy) is medication to be sent to? [facility] [address] [place] - [address] Phone: [phone]  Fax: [phone]       5. Do they need a 30 day or 90 day supply? 90

## 2024-09-01 NOTE — Telephone Encounter (Signed)
 Refill sent.

## 2024-09-02 ENCOUNTER — Ambulatory Visit

## 2024-09-02 VITALS — BP 142/69 | HR 104 | Temp 97.8°F | Resp 18 | Ht 61.0 in | Wt 197.8 lb

## 2024-09-02 DIAGNOSIS — E7800 Pure hypercholesterolemia, unspecified: Secondary | ICD-10-CM | POA: Diagnosis not present

## 2024-09-02 MED ORDER — INCLISIRAN SODIUM 284 MG/1.5ML ~~LOC~~ SOSY
284.0000 mg | PREFILLED_SYRINGE | Freq: Once | SUBCUTANEOUS | Status: AC
  Administered 2024-09-02: 284 mg via SUBCUTANEOUS
  Filled 2024-09-02: qty 1.5

## 2024-09-02 NOTE — Progress Notes (Signed)
 Diagnosis: Hyperlipidemia  Provider:  Mannam Praveen  Procedure: Injection  Leqvio  (inclisiran), Dose: 284 mg, Site: subcutaneous, Number of injections: 1  Injection Site(s): Left arm  Post Care: Patient declined observation  Discharge: Condition: Good, Destination: Home . AVS Provided  Performed by:  Amand Lemoine, RN

## 2024-09-02 NOTE — Telephone Encounter (Signed)
 Prescription transmitted/printed

## 2024-09-03 ENCOUNTER — Other Ambulatory Visit: Payer: Self-pay

## 2024-09-03 ENCOUNTER — Inpatient Hospital Stay: Attending: Hematology and Oncology | Admitting: Hematology and Oncology

## 2024-09-03 ENCOUNTER — Encounter: Payer: Self-pay | Admitting: Hematology and Oncology

## 2024-09-03 ENCOUNTER — Other Ambulatory Visit: Payer: Self-pay | Admitting: Hematology and Oncology

## 2024-09-03 ENCOUNTER — Inpatient Hospital Stay

## 2024-09-03 VITALS — BP 123/63 | HR 99 | Temp 98.3°F | Resp 16 | Ht 61.0 in | Wt 196.2 lb

## 2024-09-03 DIAGNOSIS — D649 Anemia, unspecified: Secondary | ICD-10-CM

## 2024-09-03 LAB — CBC WITH DIFFERENTIAL (CANCER CENTER ONLY)
Abs Immature Granulocytes: 0.01 K/uL (ref 0.00–0.07)
Basophils Absolute: 0 K/uL (ref 0.0–0.1)
Basophils Relative: 1 %
Eosinophils Absolute: 0.1 K/uL (ref 0.0–0.5)
Eosinophils Relative: 3 %
HCT: 31 % — ABNORMAL LOW (ref 36.0–46.0)
Hemoglobin: 10 g/dL — ABNORMAL LOW (ref 12.0–15.0)
Immature Granulocytes: 0 %
Lymphocytes Relative: 14 %
Lymphs Abs: 0.7 K/uL (ref 0.7–4.0)
MCH: 28.9 pg (ref 26.0–34.0)
MCHC: 32.3 g/dL (ref 30.0–36.0)
MCV: 89.6 fL (ref 80.0–100.0)
Monocytes Absolute: 0.4 K/uL (ref 0.1–1.0)
Monocytes Relative: 8 %
Neutro Abs: 3.8 K/uL (ref 1.7–7.7)
Neutrophils Relative %: 74 %
Platelet Count: 146 K/uL — ABNORMAL LOW (ref 150–400)
RBC: 3.46 MIL/uL — ABNORMAL LOW (ref 3.87–5.11)
RDW: 14.8 % (ref 11.5–15.5)
Smear Review: NORMAL
WBC Count: 5.1 K/uL (ref 4.0–10.5)
nRBC: 0 % (ref 0.0–0.2)

## 2024-09-03 LAB — VITAMIN B12: Vitamin B-12: 1734 pg/mL — ABNORMAL HIGH (ref 180–914)

## 2024-09-03 LAB — FOLATE: Folate: >20 ng/mL (ref >5.9)

## 2024-09-03 NOTE — Progress Notes (Unsigned)
 [facility] [address] [place],  [place]  [address] [phone]  Clinic Day:  09/03/2024   Referring physician: Monetta Redell PARAS, MD  Patient Care Team: Patient Care Team: Ofilia Lamar CROME, MD as PCP - General (Family Medicine) Monetta Redell PARAS, MD as PCP - Cardiology (Cardiology) Monetta Redell PARAS, MD as Consulting Physician (Cardiology) Burnard Debby LABOR, MD (Inactive) as Consulting Physician (Cardiology) Sheril Coy, MD as Consulting Physician (Orthopedic Surgery) Melita Drivers, MD as Consulting Physician (Orthopedic Surgery)   REASON FOR CONSULTATION:  Iron  deficiency anemia.  HISTORY OF PRESENT ILLNESS:   Katelyn [name] is a 68 y.o. female with anemia who is referred in consultation by Dr. Redell Monetta for assessment and management.  She has multiple medical comorbidities.  She has a history of mild anemia dating back to December 2021, but had worsening in February 2024.  CBC on November 18 revealed hemoglobin 10.8 with an MCV of 90, WBCs 5.8 and platelets 171,000.  BMP reveals creatinine 1.64 BUN 52, glucose 165 and estimated GFR 34. iron  studies were equivocal with TIBC 384, iron  saturation 13% and ferritin 60.  She was hospitalized for a week in late October with acute on chronic respiratory failure.  Her hemoglobin dropped to 7.9 prior to discharge on November 3.  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 Moderate erythematous, nodular mucosa in the antrum.  Multiple polyps in the fundus of the stomach and body of the stomach were removed.  Pathology revealed chronic gastritis and fundic gland polyps. Annual EGD was recommended.  Colonoscopy in November 2024 revealed multiple small angioectasias in the hepatic flexure. Diverticulosis of moderate severity in the descending colon and sigmoid colon. Medium (grade 1) hemorrhoids.   The patient states she has been more tired than normal, but has attributed  this to her right shoulder pain.  She denies any overt form of blood loss. She is not taking an iron  supplement.  She states she has been transfused on 3 occasions in the last few years, but not since 2024.  She has never received IV iron .  She reports rash of the proximal right arm that may have been due to her glucose monitor adhesive, this is resolving. 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 She uses a motorized wheelchair due to lower extremity weakness.  She is able to get up with a walker, but is unsteady.  She is grieving the loss of her niece to lung cancer and an uncle at age 78 in the past week.  Past Medical History: Chronic kidney disease, non-alcoholic liver cirrhosis, diabetes, left bundle branch block, heart failure with injection fraction of 25%, hypertension, hyperlipidemia, GERD, depression/anxiety, sleep apnea but can't tolerate her bipap, restless legs, peripheral neuropathy, migraine, avascular necrosis of the right shoulder.  History of kidney stones.  Status post lumbar laminectomy and lumbar fusion, abdominal hysterectomy, appendectomy, cholecystectomy, bilateral carpal tunnel release, bilateral cataract surgery, kidney stone surgery, right shoulder surgery, left knee surgery, left hip bursitis. She was unable to undergo a mammogram this year because of her shoulder.  Social History:  She is a non-smoker.  She denies current or previous heavy alcohol use.  She denies other substance use.  She was born and raised in [place].  She is  married and lives with her spouse.  She had one child killed in MVA at age 48.  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  Family History: There is no known family history of anemia or blood dyscrasia.  Her father had lung cancer at  25. Her paternal aunt had breast cancer at 65. A sister had cancer in the bone of uncertain primary at 24.   REVIEW OF SYSTEMS:   Review of Systems  Constitutional:  Negative for appetite change, chills, fatigue, fever and unexpected weight change.  HENT:   Negative for lump/mass, mouth sores, nosebleeds and sore throat.   Respiratory:  Negative for cough, hemoptysis and shortness of breath.   Cardiovascular:  Negative for chest pain and leg swelling.  Gastrointestinal:  Negative for abdominal pain, blood in stool, constipation, diarrhea, nausea and vomiting.  Endocrine: Negative for hot flashes.  Genitourinary:  Positive for bladder incontinence. Negative for difficulty urinating, dysuria, frequency, hematuria and vaginal bleeding.   Musculoskeletal:  Positive for gait problem (lower extremity weakness). Negative for arthralgias, back pain and myalgias.  Skin:  Negative for rash.  Neurological:  Positive for extremity weakness (lower extremities), gait problem (lower extremity weakness) and numbness (neuropathy). Negative for dizziness, headaches and light-headedness.  Hematological:  Negative for adenopathy. Does not bruise/bleed easily.  Psychiatric/Behavioral:  Negative for depression and sleep disturbance. The patient is not nervous/anxious.      VITALS:   Blood pressure 123/63, pulse 99, temperature 98.3 F (36.8 C), temperature source Oral, resp. rate 16, height 5' 1 (1.549 m), weight 196 lb 3.2 oz (89 kg), SpO2 99%.  Wt Readings from Last 3 Encounters:  09/03/24 196 lb 3.2 oz (89 kg)  09/02/24 197 lb 12.8 oz (89.7 kg)  08/26/24 196 lb 12.8 oz (89.3 kg)    Body mass index is 37.07 kg/m.  Performance status (ECOG): 2 - Symptomatic, <50% confined to bed  PHYSICAL EXAM:   Physical Exam Vitals and nursing note reviewed.  Constitutional:      General: She is not in acute distress.    Appearance: Normal appearance.  HENT:     Head: Normocephalic and atraumatic.      Mouth/Throat:     Mouth: Mucous membranes are moist.     Pharynx: Oropharynx is clear. No oropharyngeal exudate or posterior oropharyngeal erythema.  Eyes:     General: No scleral icterus.    Extraocular Movements: Extraocular movements intact.     Conjunctiva/sclera: Conjunctivae normal.     Pupils: Pupils are equal, round, and reactive to light.  Cardiovascular:     Rate and Rhythm: Normal rate and regular rhythm.     Heart sounds: Normal heart sounds. No murmur heard.    No friction rub. No gallop.  Pulmonary:     Effort: Pulmonary effort is normal.     Breath sounds: Normal breath sounds. No wheezing, rhonchi or rales.  Abdominal:     General: There is no distension.     Palpations: Abdomen is soft. There is no mass.     Tenderness: There is no abdominal tenderness.  Musculoskeletal:        General: Normal range of motion.     Cervical back: Normal range of motion and neck supple. No tenderness.     Right lower leg: No edema.     Left lower leg: No edema.  Lymphadenopathy:     Cervical: No cervical adenopathy.     Upper Body:  Right upper body: No supraclavicular or axillary adenopathy.     Left upper body: No supraclavicular or axillary adenopathy.  Skin:    General: Skin is warm and dry.     Coloration: Skin is not jaundiced.     Findings: No rash.  Neurological:     Mental Status: She is alert and oriented to person, place, and time.     Cranial Nerves: No cranial nerve deficit.  Psychiatric:        Mood and Affect: Mood normal.        Behavior: Behavior normal.        Thought Content: Thought content normal.      LABS:      Latest Ref Rng & Units 09/03/2024    2:12 PM 08/26/2024   10:52 AM 08/05/2024    2:18 AM  CBC  WBC 4.0 - 10.5 K/uL 5.1  5.8  5.3   Hemoglobin 12.0 - 15.0 g/dL 89.9  89.1  7.9   Hematocrit 36.0 - 46.0 % 31.0  34.1  24.9   Platelets 150 - 400 K/uL 146  171  112       Latest Ref Rng & Units 08/26/2024   10:52 AM 08/11/2024    6:19  AM 08/10/2024    7:47 AM  CMP  Glucose 70 - 99 mg/dL 834  818  839   BUN 8 - 27 mg/dL 52  52  44   Creatinine 0.57 - 1.00 mg/dL 8.35  8.53  8.51   Sodium 134 - 144 mmol/L 143  133  136   Potassium 3.5 - 5.2 mmol/L 4.4  3.9  4.1   Chloride 96 - 106 mmol/L 99  92  97   CO2 20 - 29 mmol/L 26  31  28    Calcium  8.7 - 10.3 mg/dL 9.2  8.8  8.8      Lab Results  Component Value Date   TIBC 332 08/26/2024   TIBC 318 03/16/2023   FERRITIN 60 08/26/2024   FERRITIN 138 03/16/2023   IRONPCTSAT 13 (L) 08/26/2024   IRONPCTSAT 10 (L) 03/16/2023     STUDIES:   No results found.    HISTORY:   Past Medical History:  Diagnosis Date   ACS (acute coronary syndrome) (HCC) 04/06/2023   Acute heart failure with preserved ejection fraction (HFpEF) (HCC) 08/04/2024   Acute on chronic diastolic CHF (congestive heart failure) (HCC) 08/03/2024   Acute on chronic respiratory failure with hypoxia (HCC) 08/03/2024   AKI (acute kidney injury) 02/20/2023   Anxiety    Arthritis    Bursitis of right hip    CAD (coronary artery disease)    Cardiac catheterization June 2014 in [place] - 50% circumflex stenosis   Chest pain, neg MI, stable CAD non obstructive on cath 10/05/20 10/04/2020   Chronic diastolic CHF (congestive heart failure) (HCC) 08/20/2017   Chronic diastolic heart failure (HCC) 08/20/2017   Chronic hypoxemic respiratory failure (HCC) 10/07/2020   Chronic kidney disease, stage 3 (HCC)    does not see nephrologist   Chronic low back pain without sciatica 03/14/2016   Cirrhosis of liver (HCC)    CKD (chronic kidney disease), stage III (HCC) 10/07/2020   Complication of anesthesia    Cough 04/19/2017   Overview:  Last Assessment & Plan:  Formatting of this note may be different from the original. Cough - ? ACE related with AR triggers   Plan  Patient Instructions  Discuss with your primary doctor that  lisinopril  pain, need making your cough worse. May use Mucinex  DM twice daily as needed  for cough and congestion Zyrtec 10 mg at bedtime as needed for drainage Saline nasal spray as needed. Lab tests today Activity as tolerated. Follow with Dr. Shellia in 3-4 months and As needed   Please contact office for sooner follow up if symptoms do not improve or worsen or seek emergency care    COVID-19 virus infection 06/21/2021   Formatting of this note might be different from the original.  06/21/2021: paxlovid     Depression    Dyspnea    with exertion    lazy lung - per  Dr Shellia from back issues- 06/2016   Elevated liver enzymes 12/05/2016   Essential hypertension    GERD (gastroesophageal reflux disease)    Gout 03/14/2016   Greater trochanteric bursitis of right hip 02/02/2012   H/O hiatal hernia    Heart murmur    History of blood transfusion 2016   History of esophageal stricture 10/07/2020   History of kidney stones    Hypercholesterolemia    Hypertensive heart disease with congestive heart failure and chronic kidney disease (HCC) 11/25/2019   Hypertensive heart disease with heart failure (HCC) 01/01/2017   Hypoxia 10/07/2020   Iliotibial band syndrome of right side 02/02/2012   Iron  deficiency anemia due to chronic blood loss 03/14/2016   LBBB (left bundle branch block) 01/01/2017   Left bundle branch block    Leg weakness 10/07/2020   Lumbar stenosis    Meralgia paraesthetica 12/05/2016   Migraine without aura and responsive to treatment 04/08/2019   Mild CAD 11/24/2015   Morbid obesity (HCC) 10/07/2020   Morbid obesity with BMI of 40.0-44.9, adult (HCC)    Neuropathy    OSA (obstructive sleep apnea) 05/24/2016   Overview:  Managed PULM- no CPAP   Peripheral artery disease 07/11/2021   PONV (postoperative nausea and vomiting)    no N/V with patch   Pulmonary hypertension (HCC) 10/06/2020   Formatting of this note might be different from the original.  10/06/2020 : cath PAP 27 with RAP 16     Restless leg syndrome    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    Overview:   2017: dx     S/P lumbar laminectomy 11/26/2015   S/P lumbar spinal fusion 08/29/2016   Serratia 02/13/2023   SIRS (systemic inflammatory response syndrome) (HCC) 04/06/2023   Sleep apnea    Tinnitus 12/05/2016   Type 2 diabetes mellitus (HCC)    UTI (urinary tract infection) 10/07/2020   Ventricular tachycardia, nonsustained (HCC) 04/06/2023    Past Surgical History:  Procedure Laterality Date   ABDOMINAL HYSTERECTOMY  1983   APPENDECTOMY  Age 37   BACK SURGERY     FIRST LUMBAR FUSION/ SURGERY APRIL 2012 AND FUSION WITH INSTRUMENTATION SEPT 2012   CARDIAC CATHETERIZATION     x 2   CARPAL TUNNEL RELEASE     bil   CHOLECYSTECTOMY  1990's   COLONOSCOPY  12/12/2017   Colonic polyp status post polypectomy. Mild sigmoid diverticulosis. Otherwise normal colonoscopy to terminal ileum.   CYSTO EXTRACTION KIDNEY STONES     ESOPHAGOGASTRODUODENOSCOPY  12/12/2017   Small hiatal hernia. Mild gastritis. Status post esophageal dilatation.   EXCISION/RELEASE BURSA HIP  02/02/2012   Procedure: EXCISION/RELEASE BURSA HIP;  Surgeon: Tanda DELENA Heading, MD;  Location: [facility]S;  Service: Orthopedics;  Laterality: Right;  Right Hip Bursectomy   EYE SURGERY Bilateral    cataracts  IR THORACIC DISC ASPIRATION W/IMG GUIDE  02/14/2023   IR US  GUIDE BX ASP/DRAIN  07/04/2024   KIDNEY STONE SURGERY  2008   LAMINECTOMY WITH POSTERIOR LATERAL ARTHRODESIS LEVEL 3 N/A 01/09/2024   Procedure: Posterior lateral fusion - Thoracic Seven-Thoracic Eight - Thoracic Eight-Thoracic Nine - Thoracic Nine-Thoracic Ten, Thoracic Ten-Eleven, Thoracic Eleven-Twelve, Thoracic Twelve -Lumbar One, extension of fusion and removal of Thoracic Ten screws;  Surgeon: Joshua Alm Hamilton, MD;  Location: [facility] OR;  Service: Neurosurgery;  Laterality: N/A;  Posterior lateral fusion - Thoracic Seven-Thora   LAMINECTOMY WITH POSTERIOR LATERAL ARTHRODESIS LEVEL 4 N/A 02/19/2023   Procedure: THORACIC TEN-LUMBAR TWO INSTRUMENTED FUSION;   Surgeon: Joshua Alm RAMAN, MD;  Location: [facility] OR;  Service: Neurosurgery;  Laterality: N/A;   Left knee surgery x 2  1996   reconstruction   LUMBAR DISC SURGERY  08/2016   LUMBAR LAMINECTOMY/DECOMPRESSION MICRODISCECTOMY Right 11/26/2015   Procedure: Extraforaminal Microdiscectomy  - Lumbar two-three- right;  Surgeon: Alm RAMAN Joshua, MD;  Location: [facility];  Service: Neurosurgery;  Laterality: Right;  right    LUMBAR WOUND DEBRIDEMENT N/A 10/25/2016   Procedure: Lumbar wound revision;  Surgeon: Alm RAMAN Joshua, MD;  Location: [facility] OR;  Service: Neurosurgery;  Laterality: N/A;  Lumbar wound revision   Right shoulder surgery  2010   spur   RIGHT/LEFT HEART CATH AND CORONARY ANGIOGRAPHY N/A 10/05/2020   Procedure: RIGHT/LEFT HEART CATH AND CORONARY ANGIOGRAPHY;  Surgeon: Jordan, Peter M, MD;  Location: [facility] INVASIVE CV LAB;  Service: Cardiovascular;  Laterality: N/A;   SPINAL CORD STIMULATOR REMOVAL  02/19/2023   Procedure: LUMBAR SPINAL CORD STIMULATOR REMOVAL;  Surgeon: Joshua Alm RAMAN, MD;  Location: [facility] OR;  Service: Neurosurgery;;    Family History  Problem Relation Age of Onset   Lung cancer Father        smoked   Hypertension Father    Stroke Father    Cancer Father    Kidney disease Father    Heart failure Mother    Arthritis Mother    Diabetes Mother    Heart disease Mother    Kidney disease Mother    Obesity Mother    Varicose Veins Mother    Learning disabilities Sister    Miscarriages / Stillbirths Sister    Obesity Sister    Bone cancer Sister    Asthma Sister     Social History:  reports that she has never smoked. She has never used smokeless tobacco. She reports that she does not currently use alcohol. She reports that she does not use drugs.The patient is accompanied by her husband, Christopher, today.  Allergies:  Allergies  Allergen Reactions   Atorvastatin Calcium      Other Reaction(s): Not available  atorvastatin calcium    Oxycodone  Other (See Comments)     Confusion, staring episodes  States she is not allergic.    Atorvastatin Nausea And Vomiting and Other (See Comments)    MYALGIAS   Pentazocine Other (See Comments)    headache  Other Reaction(s): Not available  pentazocine   Cefepime  Other (See Comments)    9/29 - had confusion again with retrial of cefepime   Presumed AMS/neurotoxicity in the setting of AKI  cefepime    Gabapentin  Swelling   Other Nausea Only    UNSPECIFIED Anesthesia    Current Medications: Current Outpatient Medications  Medication Sig Dispense Refill   ciprofloxacin  (CIPRO ) 500 MG tablet Take 500 mg by mouth 2 (two) times daily.     doxycycline  (VIBRA -TABS)  100 MG tablet Take 1 tablet (100 mg total) by mouth 2 (two) times daily. 60 tablet 5   DULoxetine  (CYMBALTA ) 60 MG capsule Take 1 capsule (60 mg total) by mouth 2 (two) times daily. 60 capsule 5   melatonin 5 MG TABS Take 1 tablet (5 mg total) by mouth at bedtime as needed. 30 tablet 0   mirtazapine  (REMERON ) 30 MG tablet Take 1 tablet (30 mg total) by mouth at bedtime. 5 tablet 0   nitroGLYCERIN  (NITROSTAT ) 0.4 MG SL tablet Place 1 tablet (0.4 mg total) under the tongue every 5 (five) minutes as needed for chest pain. 25 tablet 1   NOVOLOG  RELION 100 UNIT/ML injection Inject 2-20 Units into the skin 3 (three) times daily with meals.     oxyCODONE  (OXY IR/ROXICODONE ) 5 MG immediate release tablet Take 1 tablet (5 mg total) by mouth every 6 (six) hours as needed for moderate pain (pain score 4-6). 12 tablet 0   pantoprazole  (PROTONIX ) 40 MG tablet Take 1 tablet by mouth once daily 90 tablet 3   polyethylene glycol powder (GLYCOLAX /MIRALAX ) 17 GM/SCOOP powder Take 17 g by mouth daily. (Patient taking differently: Take 17 g by mouth daily as needed for moderate constipation.) 238 g 0   pramipexole  (MIRAPEX ) 1 MG tablet Take 1 mg by mouth at bedtime.     spironolactone  (ALDACTONE ) 25 MG tablet Take 1 tablet (25 mg total) by mouth daily. 90 tablet 3   tirzepatide  (MOUNJARO) 5 MG/0.5ML Pen Inject 5 mg into the skin once a week. Increase to 7mg  weekly on 08/11/24.     torsemide  (DEMADEX ) 20 MG tablet Take 2 tablets (40 mg total) by mouth 2 (two) times daily. 120 tablet 1   TRESIBA  FLEXTOUCH 200 UNIT/ML FlexTouch Pen Inject 40 Units into the skin at bedtime.     hydrOXYzine  (ATARAX ) 25 MG tablet Take 25 mg by mouth every 8 (eight) hours as needed for itching. (Patient not taking: Reported on 09/03/2024)     No current facility-administered medications for this visit.     ASSESSMENT & PLAN:   Assessment/Plan:  VERNIA TEEM is a 68 y.o. female with normochromic normocytic anemia which may be multifactorial.  Most likely this represents anemia of chronic kidney disease but can be secondary to cirrhosis.  She also may have chronic GI blood loss due to varices.  Iron  studies were equivocal but soluble transferrin is not elevated so not consistent with iron  deficiency. B12, folate and serum protein electrophoresis were normal.  I will plan to see her back in 2 weeks to review the results and discuss further treatment.  We typically do not initiate erythropoietin stimulating agents unless her hemoglobin is below 10.  I discussed the assessment and plan with the patient and her husband.  They were provided an opportunity to ask questions and all were answered.  The patient agreed with the plan and demonstrated an understanding of the instructions.    Thank you for the referral.   45 minutes was spent in patient care.  This included time spent preparing to see the patient (e.g., review of tests), obtaining and/or reviewing separately obtained history, counseling and educating the patient/family/caregiver, ordering medications, tests, or procedures; documenting clinical information in the electronic or other health record, independently interpreting results and communicating results to the patient/family/caregiver as well as coordination of care.      Andrez DELENA Foy,  PA-C   Physician Assistant [facility] [phone]

## 2024-09-04 LAB — SOLUBLE TRANSFERRIN RECEPTOR: Transferrin Receptor: 21.7 nmol/L (ref 12.2–27.3)

## 2024-09-08 LAB — PROTEIN ELECTROPHORESIS, SERUM, WITH REFLEX
A/G Ratio: 1 (ref 0.7–1.7)
Albumin ELP: 3.1 g/dL (ref 2.9–4.4)
Alpha-1-Globulin: 0.2 g/dL (ref 0.0–0.4)
Alpha-2-Globulin: 0.6 g/dL (ref 0.4–1.0)
Beta Globulin: 1.1 g/dL (ref 0.7–1.3)
Gamma Globulin: 1.2 g/dL (ref 0.4–1.8)
Globulin, Total: 3.2 g/dL (ref 2.2–3.9)
Total Protein ELP: 6.3 g/dL (ref 6.0–8.5)

## 2024-09-09 ENCOUNTER — Encounter: Payer: Self-pay | Admitting: Cardiology

## 2024-09-11 ENCOUNTER — Inpatient Hospital Stay: Attending: Hematology and Oncology

## 2024-09-11 DIAGNOSIS — D696 Thrombocytopenia, unspecified: Secondary | ICD-10-CM | POA: Insufficient documentation

## 2024-09-11 DIAGNOSIS — D649 Anemia, unspecified: Secondary | ICD-10-CM | POA: Insufficient documentation

## 2024-09-11 DIAGNOSIS — K7460 Unspecified cirrhosis of liver: Secondary | ICD-10-CM | POA: Insufficient documentation

## 2024-09-11 NOTE — Progress Notes (Signed)
 [facility] Clinical Social Work  Clinical Social Work was referred by medical provider for emotional support.  Clinical Social Worker contacted patient by phone to offer support and assess for needs.  Patient and family have had recent losses that has understandably impacted her mood. Patient expressed brief feelings of being overwhelmed, but feels that she is handling the events well. Denied any needs for extra support at this time. Patient understands if needs change CSW is more than happy to connect her with care. No other needs. Referral will be closed.  Follow Up Plan:  Patient will contact CSW with any support or resource needs    Lizbeth Sprague, LCSW  Clinical Social Worker [facility]

## 2024-09-16 ENCOUNTER — Other Ambulatory Visit: Payer: Self-pay | Admitting: Hematology and Oncology

## 2024-09-16 DIAGNOSIS — D638 Anemia in other chronic diseases classified elsewhere: Secondary | ICD-10-CM

## 2024-09-16 NOTE — Progress Notes (Unsigned)
 [facility]  [address] [place],  [place]  [address] [phone]  Clinic Day:  09/16/2024  Referring physician: Ofilia Lamar CROME, MD   HISTORY OF PRESENT ILLNESS:  The patient is a 68 y.o. female with anemia which may be multifactorial due to anemia of chronic kidney disease, cirrhosis, and chronic blood loss.  She has also had intermittent mild thrombocytopenia.  She has a history of mild anemia dating back to December 2021, which worsened over time. Iron  studies in November were equivocal with a low iron  saturation , normal TIBC and ferritin, but soluble transferrin was normal, so not consistent with iron  deficiency.  No other specific etiology was found.  She was seen by Dr. Dorise Lennox May 30 for initial visit regarding cirrhosis. 6 month follow up was recommended.  VITALS:   There were no vitals taken for this visit. Wt Readings from Last 3 Encounters:  09/03/24 196 lb 3.2 oz (89 kg)  09/02/24 197 lb 12.8 oz (89.7 kg)  08/26/24 196 lb 12.8 oz (89.3 kg)   There is no height or weight on file to calculate BMI.  Performance status (ECOG): {CHL ONC [id number]  PHYSICAL EXAM:   Physical Exam   LABS:      Latest Ref Rng & Units 09/03/2024    2:12 PM 08/26/2024   10:52 AM 08/05/2024    2:18 AM  CBC  WBC 4.0 - 10.5 K/uL 5.1  5.8  5.3   Hemoglobin 12.0 - 15.0 g/dL 89.9  89.1  7.9   Hematocrit 36.0 - 46.0 % 31.0  34.1  24.9   Platelets 150 - 400 K/uL 146  171  112       Latest Ref Rng & Units 08/26/2024   10:52 AM 08/11/2024    6:19 AM 08/10/2024    7:47 AM  CMP  Glucose 70 - 99 mg/dL 834  818  839   BUN 8 - 27 mg/dL 52  52  44   Creatinine 0.57 - 1.00 mg/dL 8.35  8.53  8.51   Sodium 134 - 144 mmol/L 143  133  136   Potassium 3.5 - 5.2 mmol/L 4.4  3.9  4.1   Chloride 96 - 106 mmol/L 99  92  97   CO2 20 - 29 mmol/L 26  31  28    Calcium  8.7 - 10.3 mg/dL 9.2  8.8  8.8      No results found for: CEA1, CEA / No results found for: CEA1,  CEA No results found for: PSA1 No results found for: CAN199 No results found for: RJW874  Lab Results  Component Value Date   TOTALPROTELP 6.3 09/03/2024   ALBUMINELP 3.1 09/03/2024   A1GS 0.2 09/03/2024   A2GS 0.6 09/03/2024   BETS 1.1 09/03/2024   GAMS 1.2 09/03/2024   MSPIKE Not Observed 09/03/2024   Lab Results  Component Value Date   TIBC 332 08/26/2024   TIBC 318 03/16/2023   FERRITIN 60 08/26/2024   FERRITIN 138 03/16/2023   IRONPCTSAT 13 (L) 08/26/2024   IRONPCTSAT 10 (L) 03/16/2023        Component Value Date/Time   TOTALPROTELP 6.3 09/03/2024 1614   ALBUMINELP 3.1 09/03/2024 1614   A1GS 0.2 09/03/2024 1614   A2GS 0.6 09/03/2024 1614   BETS 1.1 09/03/2024 1614   GAMS 1.2 09/03/2024 1614   MSPIKE Not Observed 09/03/2024 1614    Review Flowsheet  More data may exist      Latest Ref  Rng & Units 03/16/2023 08/26/2024 09/03/2024  Oncology Labs  Ferritin 15 - 150 ng/mL 138  60  -  %SAT 15 - 55 % 10  13  -  Total Protein ELP 6.0 - 8.5 g/dL - - 6.3   Albumin  ELP 2.9 - 4.4 g/dL - - 3.1   Alpha-1 Globulin 0.0 - 0.4 g/dL - - 0.2   Alpha-2 Globulin 0.4 - 1.0 g/dL - - 0.6   Beta Globulin 0.7 - 1.3 g/dL - - 1.1   Gamma Globulin 0.4 - 1.8 g/dL - - 1.2   M-Spike, % Not Observed g/dL - - Not Observed      STUDIES:   No results found.    ASSESSMENT & PLAN:   Assessment/Plan:  68 y.o. female with anemia which may be multifactorial.  The patient understands all the plans discussed today and is in agreement with them.  She knows to contact our office if she develops concerns prior to her next appointment.     Andrez DELENA Foy, PA-C   Physician Assistant [facility] [place] [phone]

## 2024-09-17 ENCOUNTER — Encounter: Payer: Self-pay | Admitting: Hematology and Oncology

## 2024-09-17 ENCOUNTER — Other Ambulatory Visit: Payer: Self-pay

## 2024-09-17 ENCOUNTER — Telehealth: Payer: Self-pay | Admitting: Hematology and Oncology

## 2024-09-17 ENCOUNTER — Inpatient Hospital Stay

## 2024-09-17 ENCOUNTER — Inpatient Hospital Stay: Admitting: Hematology and Oncology

## 2024-09-17 VITALS — BP 121/57 | HR 80 | Temp 98.0°F | Resp 18

## 2024-09-17 DIAGNOSIS — K7460 Unspecified cirrhosis of liver: Secondary | ICD-10-CM | POA: Diagnosis not present

## 2024-09-17 DIAGNOSIS — D638 Anemia in other chronic diseases classified elsewhere: Secondary | ICD-10-CM

## 2024-09-17 DIAGNOSIS — D649 Anemia, unspecified: Secondary | ICD-10-CM

## 2024-09-17 DIAGNOSIS — D696 Thrombocytopenia, unspecified: Secondary | ICD-10-CM | POA: Diagnosis not present

## 2024-09-17 LAB — CMP (CANCER CENTER ONLY)
ALT: 49 U/L — ABNORMAL HIGH (ref 0–44)
AST: 68 U/L — ABNORMAL HIGH (ref 15–41)
Albumin: 3.9 g/dL (ref 3.5–5.0)
Alkaline Phosphatase: 247 U/L — ABNORMAL HIGH (ref 38–126)
Anion gap: 10 (ref 5–15)
BUN: 52 mg/dL — ABNORMAL HIGH (ref 8–23)
CO2: 31 mmol/L (ref 22–32)
Calcium: 9.8 mg/dL (ref 8.9–10.3)
Chloride: 98 mmol/L (ref 98–111)
Creatinine: 1.37 mg/dL — ABNORMAL HIGH (ref 0.44–1.00)
GFR, Estimated: 42 mL/min — ABNORMAL LOW (ref >60)
Glucose, Bld: 199 mg/dL — ABNORMAL HIGH (ref 70–99)
Potassium: 3.8 mmol/L (ref 3.5–5.1)
Sodium: 139 mmol/L (ref 135–145)
Total Bilirubin: 0.4 mg/dL (ref 0.0–1.2)
Total Protein: 7 g/dL (ref 6.5–8.1)

## 2024-09-17 LAB — CBC WITH DIFFERENTIAL (CANCER CENTER ONLY)
Abs Immature Granulocytes: 0.02 K/uL (ref 0.00–0.07)
Basophils Absolute: 0 K/uL (ref 0.0–0.1)
Basophils Relative: 1 %
Eosinophils Absolute: 0.1 K/uL (ref 0.0–0.5)
Eosinophils Relative: 3 %
HCT: 32.4 % — ABNORMAL LOW (ref 36.0–46.0)
Hemoglobin: 10.5 g/dL — ABNORMAL LOW (ref 12.0–15.0)
Immature Granulocytes: 0 %
Lymphocytes Relative: 15 %
Lymphs Abs: 0.7 K/uL (ref 0.7–4.0)
MCH: 28.8 pg (ref 26.0–34.0)
MCHC: 32.4 g/dL (ref 30.0–36.0)
MCV: 88.8 fL (ref 80.0–100.0)
Monocytes Absolute: 0.5 K/uL (ref 0.1–1.0)
Monocytes Relative: 9 %
Neutro Abs: 3.4 K/uL (ref 1.7–7.7)
Neutrophils Relative %: 72 %
Platelet Count: 140 K/uL — ABNORMAL LOW (ref 150–400)
RBC: 3.65 MIL/uL — ABNORMAL LOW (ref 3.87–5.11)
RDW: 14.8 % (ref 11.5–15.5)
WBC Count: 4.8 K/uL (ref 4.0–10.5)
nRBC: 0 % (ref 0.0–0.2)

## 2024-09-17 LAB — LACTATE DEHYDROGENASE: LDH: 249 U/L — ABNORMAL HIGH (ref 105–235)

## 2024-09-17 NOTE — Telephone Encounter (Signed)
 Patient has been scheduled for follow-up visit per 09/17/2024 LOS.  Pt given an appt calendar with date and time.

## 2024-09-18 LAB — HAPTOGLOBIN: Haptoglobin: 174 mg/dL (ref 37–355)

## 2024-09-25 ENCOUNTER — Telehealth: Payer: Self-pay | Admitting: Cardiology

## 2024-09-25 NOTE — Telephone Encounter (Signed)
 Pt c/o swelling/edema: STAT if pt has developed SOB within 24 hours  If swelling, where is the swelling located? legs  How much weight have you gained and in what time span? 10th- 196 12/18 202  Have you gained 2 pounds in a day or 5 pounds in a week? Yes   Do you have a log of your daily weights (if so, list)?  no Are you currently taking a fluid pill? Yes   Are you currently SOB? no  Have you traveled recently in a car or plane for an extended period of time? No Please advise .

## 2024-09-30 ENCOUNTER — Encounter: Payer: Self-pay | Admitting: Infectious Diseases

## 2024-09-30 ENCOUNTER — Ambulatory Visit: Admitting: Infectious Diseases

## 2024-09-30 ENCOUNTER — Other Ambulatory Visit: Payer: Self-pay

## 2024-09-30 VITALS — BP 140/81 | HR 88 | Temp 97.8°F | Ht 61.0 in | Wt 196.2 lb

## 2024-09-30 DIAGNOSIS — Z79899 Other long term (current) drug therapy: Secondary | ICD-10-CM

## 2024-09-30 DIAGNOSIS — T8463XA Infection and inflammatory reaction due to internal fixation device of spine, initial encounter: Secondary | ICD-10-CM | POA: Diagnosis not present

## 2024-09-30 DIAGNOSIS — M25522 Pain in left elbow: Secondary | ICD-10-CM

## 2024-09-30 DIAGNOSIS — M4645 Discitis, unspecified, thoracolumbar region: Secondary | ICD-10-CM

## 2024-09-30 DIAGNOSIS — K7460 Unspecified cirrhosis of liver: Secondary | ICD-10-CM

## 2024-09-30 DIAGNOSIS — G061 Intraspinal abscess and granuloma: Secondary | ICD-10-CM

## 2024-09-30 DIAGNOSIS — M25511 Pain in right shoulder: Secondary | ICD-10-CM | POA: Diagnosis not present

## 2024-09-30 MED ORDER — DOXYCYCLINE HYCLATE 100 MG PO TABS: 100.0000 mg | ORAL_TABLET | Freq: Two times a day (BID) | ORAL | 0 refills | Status: AC

## 2024-09-30 MED ORDER — CIPROFLOXACIN HCL 500 MG PO TABS: 500.0000 mg | ORAL_TABLET | Freq: Two times a day (BID) | ORAL | 0 refills | Status: DC

## 2024-09-30 NOTE — Progress Notes (Signed)
 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) 08/13/2024   Acute heart failure with preserved ejection fraction (HFpEF) (HCC) 08/04/2024   Acute on chronic respiratory failure with hypoxia (HCC) 08/03/2024   Acute on chronic diastolic CHF (congestive heart failure) (HCC) 08/03/2024   Lower extremity edema 07/04/2024   Malnutrition of moderate degree (Gomez: 60% to less than 75% of standard weight) 05/21/2024   Right shoulder pain 04/10/2024   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 02/19/2024   Medication management 02/19/2024   Presence of retained hardware 02/19/2024   Neurogenic bladder 02/10/2024   Neuropathic pain 02/06/2024   Chronic pain syndrome 01/25/2024   Thoracic spondylosis with myelopathy 01/07/2024   Hepatic cirrhosis (HCC) 11/22/2023   Acute sciatica 04/06/2023   Dyslipidemia 04/06/2023   Hyponatremia 04/06/2023   Hypothermia 04/06/2023   Renal insufficiency 04/06/2023   SIRS (systemic inflammatory response syndrome) (HCC) 04/06/2023   Slurred speech 04/06/2023   Thrombocytopenia 04/06/2023   Ventricular tachycardia, nonsustained (HCC) 04/06/2023   ACS (acute coronary syndrome) (HCC) 04/06/2023   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   Elevated LFTs 03/13/2023   AKI (acute kidney injury) 02/20/2023   Status post thoracic spinal fusion 02/19/2023   Discitis 02/13/2023   Malfunction of spinal cord stimulator 02/13/2023   Serratia 02/13/2023   Osteomyelitis (HCC) 02/05/2023   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   Infection of spinal cord stimulator 12/07/2022   Lactic acidosis 12/06/2022   Hypomagnesemia 12/06/2022   Anemia of chronic disease 12/06/2022   Diskitis 12/05/2022   Status post insertion of spinal cord stimulator 11/22/2022   Acute thoracic back pain 06/22/2022   Other long term (current) drug therapy  06/22/2022   Lumbar spondylosis 06/22/2022   Bilateral sacroiliitis 06/22/2022   Chronic, continuous use of opioids 05/31/2022   Pain medication agreement 05/02/2022   Postlaminectomy syndrome of lumbar region 05/02/2022   Radiculopathy of thoracolumbar region 05/02/2022   Obesity (BMI 30-39.9) 05/02/2022   Impaired mobility and ADLs 05/02/2022   Mastodynia 04/13/2022   Peripheral artery disease 07/11/2021   COVID-19 virus infection 06/21/2021   Rib pain on left side 06/06/2021   Abdominal pain 06/05/2021   Diabetes 1.5, 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   GAD (generalized anxiety disorder) 11/25/2020   Type 2 diabetes mellitus (HCC)    Restless leg syndrome    PONV (postoperative nausea and vomiting)    Neuropathy    Lumbar stenosis    Left bundle branch block    Hypercholesterolemia    Heart murmur    History of kidney stones    H/O hiatal hernia    Essential hypertension    Dyspnea    Depression    Complication of anesthesia    CKD stage 3b, GFR 30-44 ml/min (HCC)    CAD (coronary artery disease)    Bursitis of right hip    Arthritis    Morbid obesity with BMI of 40.0-44.9, adult (HCC) 10/07/2020   Hypoxia 10/07/2020   History of esophageal stricture 10/07/2020   Leg weakness 10/07/2020   UTI (urinary tract infection) 10/07/2020   Chronic hypoxemic respiratory failure (HCC) 10/07/2020   Morbid obesity (HCC) 10/07/2020   CKD (chronic kidney disease), stage III (HCC) 10/07/2020   Pulmonary hypertension (HCC) 10/06/2020   Chest pain, neg MI,  stable CAD non obstructive on cath 10/05/20 10/04/2020   Hardening of the aorta (main artery of the heart) 11/25/2019   Hypertensive heart disease with congestive heart failure and chronic kidney disease (HCC) 11/25/2019   Fatty liver 04/08/2019   History of chronic kidney disease 04/08/2019   Migraine without aura and responsive to treatment 04/08/2019   Chronic diastolic CHF  (congestive heart failure) (HCC) 08/20/2017   Chronic diastolic heart failure (HCC) 08/20/2017   Genetic testing 08/17/2017   Family history of ovarian cancer 08/17/2017   Family history of breast cancer 08/17/2017   Cough 04/19/2017   DOE (dyspnea on exertion) 04/19/2017   Type 2 diabetes mellitus with diabetic neuropathy, unspecified (HCC) 01/16/2017   Hypertensive heart disease with heart failure (HCC) 01/01/2017   Hyperlipidemia 01/01/2017   LBBB (left bundle branch block) 01/01/2017   Elevated liver enzymes 12/05/2016   Meralgia paraesthetica 12/05/2016   Tinnitus 12/05/2016   Spinal stenosis of lumbar region with neurogenic claudication 12/05/2016   Wound dehiscence 10/23/2016   S/P lumbar spinal fusion 08/29/2016   OSA (obstructive sleep apnea) 05/24/2016   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   Chronic low back pain without sciatica 03/14/2016   GERD (gastroesophageal reflux disease) 03/14/2016   Gout 03/14/2016   Iron  deficiency anemia due to chronic blood loss 03/14/2016   Recurrent major depression in remission 12/07/2015   Restless leg 12/07/2015   Insomnia 12/07/2015   S/P lumbar laminectomy 11/26/2015   Postprocedural state 11/26/2015   Mild CAD 11/24/2015   Lumbar radiculopathy 10/15/2015   History of blood transfusion 2016   Kidney stone 04/20/2014   Stricture of esophagus 04/20/2014   Greater trochanteric bursitis of right hip 02/02/2012   Iliotibial band syndrome of right side 02/02/2012   Iliotibial band syndrome 02/02/2012   Bursitis, trochanteric 02/02/2012    Patient's Medications  New Prescriptions   No medications on file  Previous Medications   CIPROFLOXACIN  (CIPRO ) 500 MG TABLET    Take 500 mg by mouth 2 (two) times daily.   DOXYCYCLINE  (VIBRA -TABS) 100 MG TABLET    Take 1 tablet (100 mg total) by mouth 2 (two) times daily.   DULOXETINE  (CYMBALTA ) 60 MG CAPSULE    Take 1 capsule (60 mg total) by mouth 2 (two) times daily.   HYDROXYZINE  (ATARAX )  25 MG TABLET    Take 25 mg by mouth every 8 (eight) hours as needed for itching.   MELATONIN 5 MG TABS    Take 1 tablet (5 mg total) by mouth at bedtime as needed.   MIRTAZAPINE  (REMERON ) 30 MG TABLET    Take 1 tablet (30 mg total) by mouth at bedtime.   NITROGLYCERIN  (NITROSTAT ) 0.4 MG SL TABLET    Place 1 tablet (0.4 mg total) under the tongue every 5 (five) minutes as needed for chest pain.   NOVOLOG  RELION 100 UNIT/ML INJECTION    Inject 2-20 Units into the skin 3 (three) times daily with meals.   OXYCODONE  (OXY IR/ROXICODONE ) 5 MG IMMEDIATE RELEASE TABLET    Take 1 tablet (5 mg total) by mouth every 6 (six) hours as needed for moderate pain (pain score 4-6).   PANTOPRAZOLE  (PROTONIX ) 40 MG TABLET    Take 1 tablet by mouth once daily   POLYETHYLENE GLYCOL POWDER (GLYCOLAX /MIRALAX ) 17 GM/SCOOP POWDER    Take 17 g by mouth daily.   PRAMIPEXOLE  (MIRAPEX ) 1 MG TABLET    Take 1 mg by mouth at bedtime.   SPIRONOLACTONE  (ALDACTONE ) 25 MG TABLET  Take 1 tablet (25 mg total) by mouth daily.   TIRZEPATIDE (MOUNJARO) 5 MG/0.5ML PEN    Inject 5 mg into the skin once a week. Increase to 7mg  weekly on 08/11/24.   TORSEMIDE  (DEMADEX ) 20 MG TABLET    Take 2 tablets (40 mg total) by mouth 2 (two) times daily.   TRESIBA  FLEXTOUCH 200 UNIT/ML FLEXTOUCH PEN    Inject 40 Units into the skin at bedtime.  Modified Medications   No medications on file  Discontinued Medications   No medications on file   Subjective Discussed the use of AI scribe software for clinical note transcription with the patient, who gave verbal consent to proceed.   68 year old female with prior postop infection with Serratia marcescens in 2017, followed by T11-T12 discitis/osteomyelitis with epidural phlegmon with spinal cord stimulator in place, with negative cultures 11/2022 s/p course of vancomycin  and cefepime  complicated with AKI due to vancomycin , switched later to Ciprofloxacin  and doxycycline  but worsening pain after stimulator  program changed with MRI showing progressive discitis/osteomyelitis s/p thoracic laminectomy with medial facetectomy T11-T12 and posterior fixation T12-L2, instrumented posterior fixation and removal of [place] stimulator ( cx NG) discharged on empiric daptomycin  and cefepime  but later cefepime  changed to ertapenem  in the setting of AMS and acute renal failure> PO doxycycline  and ciprofloxacin  suppression who is here for HFU after recent admission 4/2-4/25 for electively surgery in the setting of acute onset paraparesis. No prior fevers, chills, malaise, nausea, vomiting. Compliant with OP doxy/cipro .    She is s/p decompressive thoracic laminectomy, medial facetectomy, exploration of fusion t10-l2 with removal of segmental instrumentation, removal of loosened t10 pedicle screws and replacement of b/l l1 pedicle screws, removal and replacement of of left t12 pedicle screw. Per OR note, no evidence of significant infection. There was no pus. This looks more like a chronic pseudoarthrosis or chronic scarring or inflammation than anything else. . Or cx no organisms on gram stain and no growth to date   3/31 CT T spine  Erosive endplate changes at T9-10 and T11-12 are unchanged since 12/19/2023 and remain concerning for discitis-osteomyelitis. Poorly defined epidural contrast enhancement at the T9-10 level, concerning for epidural abscess. MRI with and without contrast would be more definitive. Lucency about the left T10 transpedicular screws unchanged. Initially on daptomycin  and ertapenem , however antibiotics were switched to IV ceftriaxone  and ciprofloxacin  once Or Cx isolated as c acnes + elevated LFT. Hospital course significant for CT with Rotator cuff tear, managed with analgesics. US  liver with? Cirrhosis.   Patient's discharged on 4/25 to complete ceftriaxone  and ciprofloxacin  through 5/14   5/13 Here for fu with her husband. Getting IV ceftriaxone  through PICC without any concerns. Taking ciprofloxacin   as well. No issues with antibiotics. She is not able to walk independently and mostly bound to wheelchair.   6/9 Accompanied by husband. Reports doxycycline  and ciprofloxacin  without missed doses or concerns. She has not yet seen the neurosurgeon but has an appointment scheduled for Thursday.  She continues to experience unsteadiness and is unable to walk, receiving home health care services including occupational therapy. Last week, she fell in the kitchen when an [place] on rollers moved as she tried to steady herself. She did not lose consciousness or sustain injuries, and her husband assisted her by using a blanket to help her move to a recliner. Otherwise no new changes since last visit.   7/3 Accompanied by husband. She was seen at Orthopedics Dr Melita 6/30 for severe rt shoulder pain, referred by  Dr Joshua when seen with him. Xray was done which showed progressive collapse of the humeral head most consistent with avascular necrosis, The humeral head is completely flattened now ( no results to review). Infectious cause although not ruled out by aspiration, it was considered less likely and avascular necrosis likely related to steroid use was considered more likely. No plans for shoulder replacement anytime soon as she is also on prolonged antibiotics for her back infection.   She reports worsening rt shoulder pain since hospitalization in April, described as feeling like 'somebody's ripping my arm off,' She recently completed a five-day course of prednisone for shoulder pain. She denies frequent steroid use. Denies fevers, chills, nausea, vomiting, or diarrhea, and no other joint pain except occasional popping in her reconstructed knee. ROM of rt shoulder is limited due to pain, denies  swelling or erythema or warmth in the rt shoulder. Next fu with supple is in 6 months .   9/22 Seen by PMR on 7/30. Seen in the ED pm 8/7 for spontaneous bruise. Last seen by PCP on  She is compliant with doxycycline   and ciprofloxacin . She reports fluid building up in legs and Mybertiq acting opposite to torsemide  and advised to call her respective doctors for adjustment of medications. She hasa fu appt wit Dr Melita in dec 2025. No other concerns.   Interval events  Admitted 9/26-9/30, found to have CT showing  large, heterogeneous mass-like area engulfing the right shoulder, measuring more than 7 cm in length. There is interval bone destruction involving the glenohumeral area and probable surgical absence of the humeral head and neck. This is concerning for a large abscess/granulomatous process and possible hematoma. Neoplasm is less likely due to the rapid progression of the findings  10/26 underwent right shoulder aspiration by IR, 25 mL of serosanguineous fluid was aspirated.  Cx NG ( on abtx).  Patient was initially on IV daptomycin  and cefepime  which was switched back to prior suppressive doxycycline  and ciprofloxacin   Admitted 10/26-11/3 for acute on chronic CHF.   12/23 She continues to have severe pain in her rt  shoulder and left elbow that limits Left arm elevation and function. Pain worsens when she lies down. She uses Tylenol  Arthritis Strength. Seen by Ortho recently for left elbow pain and was referred to ID at an academic center for higher level of care and comprehensive care. She is still taking doxycycline  and ciprofloxacin .   She cannot use a manual wheelchair because of shoulder dysfunction and is pursuing a power chair with leg elevation due to significant, recurrent leg and foot swelling.  Review of Systems: All systems reviewed with pertinent positives and negatives as listed above.   Past Medical History:  Diagnosis Date   ACS (acute coronary syndrome) (HCC) 04/06/2023   Acute heart failure with preserved ejection fraction (HFpEF) (HCC) 08/04/2024   Acute on chronic diastolic CHF (congestive heart failure) (HCC) 08/03/2024   Acute on chronic respiratory failure with hypoxia (HCC)  08/03/2024   AKI (acute kidney injury) 02/20/2023   Anxiety    Arthritis    Bursitis of right hip    CAD (coronary artery disease)    Cardiac catheterization June 2014 in [place] - 50% circumflex stenosis   Chest pain, neg MI, stable CAD non obstructive on cath 10/05/20 10/04/2020   Chronic diastolic CHF (congestive heart failure) (HCC) 08/20/2017   Chronic diastolic heart failure (HCC) 08/20/2017   Chronic hypoxemic respiratory failure (HCC) 10/07/2020   Chronic kidney disease, stage 3 (  HCC)    does not see nephrologist   Chronic low back pain without sciatica 03/14/2016   Cirrhosis of liver (HCC)    CKD (chronic kidney disease), stage III (HCC) 10/07/2020   Complication of anesthesia    Cough 04/19/2017   Overview:  Last Assessment & Plan:  Formatting of this note may be different from the original. Cough - ? ACE related with AR triggers   Plan  Patient Instructions  Discuss with your primary doctor that lisinopril  pain, need making your cough worse. May use Mucinex  DM twice daily as needed for cough and congestion Zyrtec 10 mg at bedtime as needed for drainage Saline nasal spray as needed. Lab tests today Activity as tolerated. Follow with Dr. Shellia in 3-4 months and As needed   Please contact office for sooner follow up if symptoms do not improve or worsen or seek emergency care    COVID-19 virus infection 06/21/2021   Formatting of this note might be different from the original.  06/21/2021: paxlovid     Depression    Dyspnea    with exertion    lazy lung - per  Dr Shellia from back issues- 06/2016   Elevated liver enzymes 12/05/2016   Essential hypertension    GERD (gastroesophageal reflux disease)    Gout 03/14/2016   Greater trochanteric bursitis of right hip 02/02/2012   H/O hiatal hernia    Heart murmur    History of blood transfusion 2016   History of esophageal stricture 10/07/2020   History of kidney stones    Hypercholesterolemia    Hypertensive heart disease with  congestive heart failure and chronic kidney disease (HCC) 11/25/2019   Hypertensive heart disease with heart failure (HCC) 01/01/2017   Hypoxia 10/07/2020   Iliotibial band syndrome of right side 02/02/2012   Iron  deficiency anemia due to chronic blood loss 03/14/2016   LBBB (left bundle branch block) 01/01/2017   Left bundle branch block    Leg weakness 10/07/2020   Lumbar stenosis    Meralgia paraesthetica 12/05/2016   Migraine without aura and responsive to treatment 04/08/2019   Mild CAD 11/24/2015   Morbid obesity (HCC) 10/07/2020   Morbid obesity with BMI of 40.0-44.9, adult (HCC)    Neuropathy    OSA (obstructive sleep apnea) 05/24/2016   Overview:  Managed PULM- no CPAP   Peripheral artery disease 07/11/2021   PONV (postoperative nausea and vomiting)    no N/V with patch   Pulmonary hypertension (HCC) 10/06/2020   Formatting of this note might be different from the original.  10/06/2020 : cath PAP 27 with RAP 16     Restless leg syndrome    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   Overview:   2017: dx     S/P lumbar laminectomy 11/26/2015   S/P lumbar spinal fusion 08/29/2016   Serratia 02/13/2023   SIRS (systemic inflammatory response syndrome) (HCC) 04/06/2023   Sleep apnea    Tinnitus 12/05/2016   Type 2 diabetes mellitus (HCC)    UTI (urinary tract infection) 10/07/2020   Ventricular tachycardia, nonsustained (HCC) 04/06/2023   Past Surgical History:  Procedure Laterality Date   ABDOMINAL HYSTERECTOMY  1983   APPENDECTOMY  Age 44   BACK SURGERY     FIRST LUMBAR FUSION/ SURGERY APRIL 2012 AND FUSION WITH INSTRUMENTATION SEPT 2012   CARDIAC CATHETERIZATION     x 2   CARPAL TUNNEL RELEASE     bil   CHOLECYSTECTOMY  1990's   COLONOSCOPY  12/12/2017  Colonic polyp status post polypectomy. Mild sigmoid diverticulosis. Otherwise normal colonoscopy to terminal ileum.   CYSTO EXTRACTION KIDNEY STONES     ESOPHAGOGASTRODUODENOSCOPY  12/12/2017   Small hiatal  hernia. Mild gastritis. Status post esophageal dilatation.   EXCISION/RELEASE BURSA HIP  02/02/2012   Procedure: EXCISION/RELEASE BURSA HIP;  Surgeon: Tanda DELENA Heading, MD;  Location: [facility];  Service: Orthopedics;  Laterality: Right;  Right Hip Bursectomy   EYE SURGERY Bilateral    cataracts   IR THORACIC DISC ASPIRATION W/IMG GUIDE  02/14/2023   IR US  GUIDE BX ASP/DRAIN  07/04/2024   KIDNEY STONE SURGERY  2008   LAMINECTOMY WITH POSTERIOR LATERAL ARTHRODESIS LEVEL 3 N/A 01/09/2024   Procedure: Posterior lateral fusion - Thoracic Seven-Thoracic Eight - Thoracic Eight-Thoracic Nine - Thoracic Nine-Thoracic Ten, Thoracic Ten-Eleven, Thoracic Eleven-Twelve, Thoracic Twelve -Lumbar One, extension of fusion and removal of Thoracic Ten screws;  Surgeon: Joshua Alm Hamilton, MD;  Location: [facility] OR;  Service: Neurosurgery;  Laterality: N/A;  Posterior lateral fusion - Thoracic Seven-Thora   LAMINECTOMY WITH POSTERIOR LATERAL ARTHRODESIS LEVEL 4 N/A 02/19/2023   Procedure: THORACIC TEN-LUMBAR TWO INSTRUMENTED FUSION;  Surgeon: Joshua Alm RAMAN, MD;  Location: [facility] OR;  Service: Neurosurgery;  Laterality: N/A;   Left knee surgery x 2  1996   reconstruction   LUMBAR DISC SURGERY  08/2016   LUMBAR LAMINECTOMY/DECOMPRESSION MICRODISCECTOMY Right 11/26/2015   Procedure: Extraforaminal Microdiscectomy  - Lumbar two-three- right;  Surgeon: Alm RAMAN Joshua, MD;  Location: [facility];  Service: Neurosurgery;  Laterality: Right;  right    LUMBAR WOUND DEBRIDEMENT N/A 10/25/2016   Procedure: Lumbar wound revision;  Surgeon: Alm RAMAN Joshua, MD;  Location: [facility] OR;  Service: Neurosurgery;  Laterality: N/A;  Lumbar wound revision   Right shoulder surgery  2010   spur   RIGHT/LEFT HEART CATH AND CORONARY ANGIOGRAPHY N/A 10/05/2020   Procedure: RIGHT/LEFT HEART CATH AND CORONARY ANGIOGRAPHY;  Surgeon: Jordan, Peter M, MD;  Location: [facility] INVASIVE CV LAB;  Service: Cardiovascular;  Laterality: N/A;   SPINAL CORD STIMULATOR REMOVAL   02/19/2023   Procedure: LUMBAR SPINAL CORD STIMULATOR REMOVAL;  Surgeon: Joshua Alm RAMAN, MD;  Location: [facility] OR;  Service: Neurosurgery;;    Social History   Tobacco Use   Smoking status: Never   Smokeless tobacco: Never   Tobacco comments:    Prior secondhand smoke  Vaping Use   Vaping status: Never Used  Substance Use Topics   Alcohol use: Not Currently   Drug use: No    Family History  Problem Relation Age of Onset   Lung cancer Father        smoked   Hypertension Father    Stroke Father    Cancer Father    Kidney disease Father    Heart failure Mother    Arthritis Mother    Diabetes Mother    Heart disease Mother    Kidney disease Mother    Obesity Mother    Varicose Veins Mother    Learning disabilities Sister    Miscarriages / Stillbirths Sister    Obesity Sister    Bone cancer Sister    Asthma Sister     Allergies  Allergen Reactions   Atorvastatin Calcium      Other Reaction(s): Not available  atorvastatin calcium    Oxycodone  Other (See Comments)    Confusion, staring episodes  States she is not allergic.    Atorvastatin Nausea And Vomiting and Other (See Comments)    MYALGIAS  Pentazocine Other (See Comments)    headache  Other Reaction(s): Not available  pentazocine   Cefepime  Other (See Comments)    9/29 - had confusion again with retrial of cefepime   Presumed AMS/neurotoxicity in the setting of AKI  cefepime    Gabapentin  Swelling   Other Nausea Only    UNSPECIFIED Anesthesia    Health Maintenance  Topic Date Due   Diabetic kidney evaluation - Urine ACR  Never done   Mammogram  Never done   OPHTHALMOLOGY EXAM  08/20/2020   COVID-19 Vaccine (3 - Pfizer risk series) 12/13/2020   Bone Density Scan  Never done   FOOT EXAM  04/14/2023   Medicare Annual Wellness (AWV)  05/27/2024   HEMOGLOBIN A1C  12/31/2024   Diabetic kidney evaluation - eGFR measurement  09/17/2025   Pneumococcal Vaccine: 50+ Years (4 of 4 - PCV20 or PCV21) 04/01/2027    Colonoscopy  12/13/2027   DTaP/Tdap/Td (4 - Td or Tdap) 03/31/2032   Influenza Vaccine  Completed   Hepatitis C Screening  Completed   Zoster Vaccines- Shingrix  Completed   Meningococcal B Vaccine  Aged Out   Hepatitis B Vaccines 19-59 Average Risk  Discontinued    Objective: BP (!) 140/81   Pulse 88   Temp 97.8 F (36.6 C) (Temporal)   Ht 5' 1 (1.549 m)   Wt 196 lb 3.2 oz (89 kg)   SpO2 99%   BMI 37.07 kg/m     Physical Exam Constitutional:      Appearance: Normal appearance. Morbidly obese  HENT:     Head: Normocephalic and atraumatic.      Mouth: Mucous membranes are moist.  Eyes:    Conjunctiva/sclera: Conjunctivae normal.     Pupils: Pupils are equal, round, and b/l symmetrical    Cardiovascular:     Rate and Rhythm: Normal rate .     Heart sounds:  Pulmonary:     Effort: Pulmonary effort is normal.     Breath sounds:   Abdominal:     General: Non distended     Palpations:  Musculoskeletal:        General:sitting in the wheel chair, Rt shoulder arm with no signs of septic arthritis. Left elbow with swelling with some restricted mobility bit no other signs of septic arthritis   Skin:    General: Skin is warm and dry.     Comments: Posterior thoracolumbar surgical wound has healed   Neurological:     General:     Mental Status: awake, alert and oriented, in wheel chair   Psychiatric:        Mood and Affect: Mood normal.   Lab Results Lab Results  Component Value Date   WBC 4.8 09/17/2024   HGB 10.5 (L) 09/17/2024   HCT 32.4 (L) 09/17/2024   MCV 88.8 09/17/2024   PLT 140 (L) 09/17/2024    Lab Results  Component Value Date   CREATININE 1.37 (H) 09/17/2024   BUN 52 (H) 09/17/2024   NA 139 09/17/2024   K 3.8 09/17/2024   CL 98 09/17/2024   CO2 31 09/17/2024    Lab Results  Component Value Date   ALT 49 (H) 09/17/2024   AST 68 (H) 09/17/2024   GGT 207 (H) 01/16/2024   ALKPHOS 247 (H) 09/17/2024   BILITOT 0.4 09/17/2024    Lab  Results  Component Value Date   CHOL 105 05/21/2024   HDL 62 05/21/2024   LDLCALC 31 05/21/2024   TRIG 49  05/21/2024   CHOLHDL 1.7 05/21/2024   No results found for: LABRPR, RPRTITER No results found for: HIV1RNAQUANT, HIV1RNAVL, CD4TABS   Microbiology Results for orders placed or performed during the hospital encounter of 08/03/24  Resp panel by RT-PCR (RSV, Flu A&B, Covid) Anterior Nasal Swab     Status: None   Collection Time: 08/03/24 12:00 PM   Specimen: Anterior Nasal Swab  Result Value Ref Range Status   SARS Coronavirus 2 by RT PCR NEGATIVE NEGATIVE Final   Influenza A by PCR NEGATIVE NEGATIVE Final   Influenza B by PCR NEGATIVE NEGATIVE Final    Comment: (NOTE) The Xpert Xpress SARS-CoV-2/FLU/RSV plus assay is intended as an aid in the diagnosis of influenza from Nasopharyngeal swab specimens and should not be used as a sole basis for treatment. Nasal washings and aspirates are unacceptable for Xpert Xpress SARS-CoV-2/FLU/RSV testing.  Fact Sheet for Patients: bloggercourse.com  Fact Sheet for Healthcare Providers: seriousbroker.it  This test is not yet approved or cleared by the [place]  FDA and has been authorized for detection and/or diagnosis of SARS-CoV-2 by FDA under an Emergency Use Authorization (EUA). This EUA will remain in effect (meaning this test can be used) for the duration of the COVID-19 declaration under Section 564(b)(1) of the Act, 21 U.S.C. section 360bbb-3(b)(1), unless the authorization is terminated or revoked.     Resp Syncytial Virus by PCR NEGATIVE NEGATIVE Final    Comment: (NOTE) Fact Sheet for Patients: bloggercourse.com  Fact Sheet for Healthcare Providers: seriousbroker.it  This test is not yet approved or cleared by the [place]  FDA and has been authorized for detection and/or diagnosis of SARS-CoV-2 by FDA  under an Emergency Use Authorization (EUA). This EUA will remain in effect (meaning this test can be used) for the duration of the COVID-19 declaration under Section 564(b)(1) of the Act, 21 U.S.C. section 360bbb-3(b)(1), unless the authorization is terminated or revoked.  Performed at [facility], [address] [place], [place] [address]    Imaging CT T spine 01/08/24 1. Severe thecal sac attenuation at the T9-10 level by soft tissue density material, likely epidural phlegmon/abscess. 2. 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 3. Unchanged endplate erosion at T9-10 and T11-12  Assessment/Plan # Thoracic epidural abscess complicated with hardware  - ciprofloxacin  and ceftriaxone  through 5/14> PO doxycycline  and ciprofloxacin  thereafter - Have discussed side effects of antibiotics including aortic aneurysm/dissection, arthropathy/arthralgia, CNS effects, C diff infection, glucose dysregulation, peripheral neuropathy, tendinopathy/tendon rupture, qtc prolongation, exacerbation of Myaesthenia gravis etc with ciprofloxacin  - EKG done at the office 5/13 Qtc 463 - 12/10 CBC and CMP reviewed, Cr 1.37, mild elevation in liver enzymes, Plts 140  Plan  - continue doxycycline  and ciprofloxacin  as is. Refilled  - she has been referred to [facility] for higher level of care and comprehensive care due to complexity and I have told them that is a good plan and they will call for making an appt. Hence, I will not follow moving forwards.   # RT shoulder pain/Possible avascular necrosis # Left elbow pain - Followed by Orthopedics Dr Melita. 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, conservative management recommended, next fu in 6 months  - seen by Dr Alyse for left elbow pain with significant degeneration and has been referred to [facility] as above  # Chronic LBP with radiculopathy/Lower extremity  weakness - followed by pain medicine, last seen  09/23/24  # Liver cirrhosis - 03/14/23 acute hep panel negative - Follows [facility], last seen 12/12  I personally spent a total of 30 minutes in the care of the patient today including preparing to see the patient, getting/reviewing separately obtained history, performing a medically appropriate exam/evaluation, counseling and educating, placing orders, documenting clinical information in the EHR, independently interpreting results, communicating results, and coordinating care.   Of note, portions of this note may have been created with voice recognition software. While this note has been edited for accuracy, occasional wrong-word or sound-a-like substitutions may have occurred due to the inherent limitations of voice recognition software.   Annalee Joseph, MD [facility] 09/30/2024, 1:47 PM  "

## 2024-10-02 DIAGNOSIS — M25522 Pain in left elbow: Secondary | ICD-10-CM | POA: Insufficient documentation

## 2024-11-10 ENCOUNTER — Other Ambulatory Visit: Payer: Self-pay | Admitting: Infectious Diseases

## 2024-11-10 NOTE — Telephone Encounter (Signed)
 Patient see's [facility] on 2/9. Please advise

## 2024-11-18 ENCOUNTER — Inpatient Hospital Stay: Admitting: Hematology and Oncology

## 2024-11-18 ENCOUNTER — Inpatient Hospital Stay

## 2024-12-03 ENCOUNTER — Ambulatory Visit: Admitting: Cardiology

## 2025-03-03 ENCOUNTER — Ambulatory Visit
